# Patient Record
Sex: Female | Born: 1965
Health system: Southern US, Community
[De-identification: ages and names within clinical notes are randomized; demographics above are authoritative.]

## PROBLEM LIST (undated history)

## (undated) DIAGNOSIS — B029 Zoster without complications: Secondary | ICD-10-CM

## (undated) DIAGNOSIS — K219 Gastro-esophageal reflux disease without esophagitis: Secondary | ICD-10-CM

## (undated) DIAGNOSIS — I255 Ischemic cardiomyopathy: Secondary | ICD-10-CM

## (undated) DIAGNOSIS — Z720 Tobacco use: Secondary | ICD-10-CM

## (undated) DIAGNOSIS — E119 Type 2 diabetes mellitus without complications: Secondary | ICD-10-CM

## (undated) DIAGNOSIS — R32 Unspecified urinary incontinence: Secondary | ICD-10-CM

## (undated) DIAGNOSIS — C801 Malignant (primary) neoplasm, unspecified: Secondary | ICD-10-CM

## (undated) DIAGNOSIS — J449 Chronic obstructive pulmonary disease, unspecified: Secondary | ICD-10-CM

## (undated) DIAGNOSIS — I5042 Chronic combined systolic (congestive) and diastolic (congestive) heart failure: Secondary | ICD-10-CM

## (undated) DIAGNOSIS — K121 Other forms of stomatitis: Secondary | ICD-10-CM

## (undated) DIAGNOSIS — M5090 Cervical disc disorder, unspecified, unspecified cervical region: Secondary | ICD-10-CM

## (undated) DIAGNOSIS — I219 Acute myocardial infarction, unspecified: Secondary | ICD-10-CM

## (undated) DIAGNOSIS — M4800 Spinal stenosis, site unspecified: Secondary | ICD-10-CM

## (undated) DIAGNOSIS — Z8739 Personal history of other diseases of the musculoskeletal system and connective tissue: Secondary | ICD-10-CM

## (undated) DIAGNOSIS — D071 Carcinoma in situ of vulva: Secondary | ICD-10-CM

## (undated) DIAGNOSIS — K859 Acute pancreatitis without necrosis or infection, unspecified: Secondary | ICD-10-CM

## (undated) DIAGNOSIS — I2510 Atherosclerotic heart disease of native coronary artery without angina pectoris: Secondary | ICD-10-CM

## (undated) DIAGNOSIS — I959 Hypotension, unspecified: Secondary | ICD-10-CM

## (undated) HISTORY — DX: Cervical disc disorder, unspecified, unspecified cervical region: M50.90

## (undated) HISTORY — PX: LYMPHADENECTOMY: SHX15

## (undated) HISTORY — DX: Unspecified urinary incontinence: R32

## (undated) HISTORY — DX: Other forms of stomatitis: K12.1

## (undated) HISTORY — DX: Carcinoma in situ of vulva: D07.1

## (undated) HISTORY — PX: ABDOMINAL HYSTERECTOMY: SHX81

## (undated) HISTORY — PX: ECTOPIC PREGNANCY SURGERY: SHX613

## (undated) HISTORY — DX: Spinal stenosis, site unspecified: M48.00

## (undated) HISTORY — DX: Personal history of other diseases of the musculoskeletal system and connective tissue: Z87.39

## (undated) HISTORY — PX: OTHER SURGICAL HISTORY: SHX169

## (undated) HISTORY — PX: SPLENECTOMY, PARTIAL: SHX787

---

## 2004-03-31 ENCOUNTER — Emergency Department: Payer: Self-pay | Admitting: Emergency Medicine

## 2004-06-04 ENCOUNTER — Emergency Department: Payer: Self-pay | Admitting: Internal Medicine

## 2004-08-30 ENCOUNTER — Emergency Department: Payer: Self-pay | Admitting: General Practice

## 2004-11-12 ENCOUNTER — Emergency Department: Payer: Self-pay | Admitting: Emergency Medicine

## 2008-02-27 ENCOUNTER — Inpatient Hospital Stay: Payer: Self-pay | Admitting: Internal Medicine

## 2008-02-27 ENCOUNTER — Other Ambulatory Visit: Payer: Self-pay

## 2008-12-24 ENCOUNTER — Ambulatory Visit: Payer: Self-pay | Admitting: Cardiovascular Disease

## 2008-12-24 ENCOUNTER — Inpatient Hospital Stay: Payer: Self-pay | Admitting: Internal Medicine

## 2009-01-30 ENCOUNTER — Ambulatory Visit: Payer: Self-pay | Admitting: Unknown Physician Specialty

## 2014-04-19 ENCOUNTER — Emergency Department: Payer: Self-pay | Admitting: Emergency Medicine

## 2014-04-19 LAB — CBC
HCT: 48.7 % — ABNORMAL HIGH (ref 35.0–47.0)
HGB: 16.1 g/dL — AB (ref 12.0–16.0)
MCH: 31.4 pg (ref 26.0–34.0)
MCHC: 33 g/dL (ref 32.0–36.0)
MCV: 95 fL (ref 80–100)
PLATELETS: 318 10*3/uL (ref 150–440)
RBC: 5.12 10*6/uL (ref 3.80–5.20)
RDW: 13.5 % (ref 11.5–14.5)
WBC: 15.2 10*3/uL — ABNORMAL HIGH (ref 3.6–11.0)

## 2014-04-19 LAB — URINALYSIS, COMPLETE
BILIRUBIN, UR: NEGATIVE
Glucose,UR: >=500 mg/dL (ref 0–75)
Nitrite: NEGATIVE
PH: 6 (ref 4.5–8.0)
Protein: NEGATIVE
Specific Gravity: 1.006 (ref 1.003–1.030)
WBC UR: 93 /HPF (ref 0–5)

## 2014-04-19 LAB — COMPREHENSIVE METABOLIC PANEL
ALT: 19 U/L
Albumin: 3.8 g/dL (ref 3.4–5.0)
Alkaline Phosphatase: 91 U/L
Anion Gap: 10 (ref 7–16)
BUN: 9 mg/dL (ref 7–18)
Bilirubin,Total: 0.5 mg/dL (ref 0.2–1.0)
CO2: 19 mmol/L — AB (ref 21–32)
Calcium, Total: 9.3 mg/dL (ref 8.5–10.1)
Chloride: 100 mmol/L (ref 98–107)
Creatinine: 0.71 mg/dL (ref 0.60–1.30)
EGFR (African American): >60
EGFR (Non-African Amer.): >60
Glucose: 321 mg/dL — ABNORMAL HIGH (ref 65–99)
Osmolality: 270 (ref 275–301)
Potassium: 4.2 mmol/L (ref 3.5–5.1)
SGOT(AST): 22 U/L (ref 15–37)
SODIUM: 129 mmol/L — AB (ref 136–145)
Total Protein: 8.6 g/dL — ABNORMAL HIGH (ref 6.4–8.2)

## 2014-04-19 LAB — TROPONIN I

## 2014-12-27 ENCOUNTER — Inpatient Hospital Stay
Admission: EM | Admit: 2014-12-27 | Discharge: 2014-12-28 | DRG: 690 | Disposition: A | Discharge status: Home / Self Care | Payer: Self-pay | Attending: Internal Medicine | Admitting: Internal Medicine

## 2014-12-27 ENCOUNTER — Encounter: Payer: Self-pay | Admitting: Emergency Medicine

## 2014-12-27 DIAGNOSIS — Z7982 Long term (current) use of aspirin: Secondary | ICD-10-CM

## 2014-12-27 DIAGNOSIS — R109 Unspecified abdominal pain: Secondary | ICD-10-CM

## 2014-12-27 DIAGNOSIS — B029 Zoster without complications: Secondary | ICD-10-CM | POA: Diagnosis present

## 2014-12-27 DIAGNOSIS — Z794 Long term (current) use of insulin: Secondary | ICD-10-CM

## 2014-12-27 DIAGNOSIS — Z859 Personal history of malignant neoplasm, unspecified: Secondary | ICD-10-CM

## 2014-12-27 DIAGNOSIS — K219 Gastro-esophageal reflux disease without esophagitis: Secondary | ICD-10-CM | POA: Diagnosis present

## 2014-12-27 DIAGNOSIS — E119 Type 2 diabetes mellitus without complications: Secondary | ICD-10-CM

## 2014-12-27 DIAGNOSIS — E785 Hyperlipidemia, unspecified: Secondary | ICD-10-CM | POA: Diagnosis present

## 2014-12-27 DIAGNOSIS — N12 Tubulo-interstitial nephritis, not specified as acute or chronic: Secondary | ICD-10-CM

## 2014-12-27 DIAGNOSIS — Z886 Allergy status to analgesic agent status: Secondary | ICD-10-CM

## 2014-12-27 DIAGNOSIS — J449 Chronic obstructive pulmonary disease, unspecified: Secondary | ICD-10-CM | POA: Diagnosis present

## 2014-12-27 DIAGNOSIS — J432 Centrilobular emphysema: Secondary | ICD-10-CM | POA: Diagnosis present

## 2014-12-27 DIAGNOSIS — Z888 Allergy status to other drugs, medicaments and biological substances status: Secondary | ICD-10-CM

## 2014-12-27 DIAGNOSIS — Z8619 Personal history of other infectious and parasitic diseases: Secondary | ICD-10-CM | POA: Diagnosis present

## 2014-12-27 DIAGNOSIS — F17210 Nicotine dependence, cigarettes, uncomplicated: Secondary | ICD-10-CM | POA: Diagnosis present

## 2014-12-27 DIAGNOSIS — N3000 Acute cystitis without hematuria: Principal | ICD-10-CM | POA: Diagnosis present

## 2014-12-27 DIAGNOSIS — R300 Dysuria: Secondary | ICD-10-CM | POA: Diagnosis present

## 2014-12-27 DIAGNOSIS — R1110 Vomiting, unspecified: Secondary | ICD-10-CM

## 2014-12-27 DIAGNOSIS — E1165 Type 2 diabetes mellitus with hyperglycemia: Secondary | ICD-10-CM | POA: Diagnosis present

## 2014-12-27 DIAGNOSIS — E1140 Type 2 diabetes mellitus with diabetic neuropathy, unspecified: Secondary | ICD-10-CM

## 2014-12-27 HISTORY — DX: Malignant (primary) neoplasm, unspecified: C80.1

## 2014-12-27 HISTORY — DX: Type 2 diabetes mellitus without complications: E11.9

## 2014-12-27 HISTORY — DX: Acute pancreatitis without necrosis or infection, unspecified: K85.90

## 2014-12-27 HISTORY — DX: Gastro-esophageal reflux disease without esophagitis: K21.9

## 2014-12-27 HISTORY — DX: Chronic obstructive pulmonary disease, unspecified: J44.9

## 2014-12-27 HISTORY — DX: Zoster without complications: B02.9

## 2014-12-27 LAB — CBC WITH DIFFERENTIAL/PLATELET
Basophils Absolute: 0 10*3/uL (ref 0–0.1)
Basophils Relative: 0 %
EOS ABS: 0.2 10*3/uL (ref 0–0.7)
Eosinophils Relative: 2 %
HCT: 50 % — ABNORMAL HIGH (ref 35.0–47.0)
HEMOGLOBIN: 16.6 g/dL — AB (ref 12.0–16.0)
Lymphocytes Relative: 31 %
Lymphs Abs: 4.2 10*3/uL — ABNORMAL HIGH (ref 1.0–3.6)
MCH: 31.5 pg (ref 26.0–34.0)
MCHC: 33.3 g/dL (ref 32.0–36.0)
MCV: 94.6 fL (ref 80.0–100.0)
MONOS PCT: 4 %
Monocytes Absolute: 0.5 10*3/uL (ref 0.2–0.9)
NEUTROS PCT: 63 %
Neutro Abs: 8.6 10*3/uL — ABNORMAL HIGH (ref 1.4–6.5)
Platelets: 297 10*3/uL (ref 150–440)
RBC: 5.28 MIL/uL — ABNORMAL HIGH (ref 3.80–5.20)
RDW: 14.3 % (ref 11.5–14.5)
WBC: 13.5 10*3/uL — ABNORMAL HIGH (ref 3.6–11.0)

## 2014-12-27 LAB — URINALYSIS COMPLETE WITH MICROSCOPIC (ARMC ONLY)
Bilirubin Urine: NEGATIVE
Glucose, UA: >500 mg/dL — AB
Ketones, ur: NEGATIVE mg/dL
NITRITE: NEGATIVE
PROTEIN: NEGATIVE mg/dL
Specific Gravity, Urine: 1.005 (ref 1.005–1.030)
pH: 5 (ref 5.0–8.0)

## 2014-12-27 LAB — GLUCOSE, CAPILLARY
Glucose-Capillary: 323 mg/dL — ABNORMAL HIGH (ref 65–99)
Glucose-Capillary: 330 mg/dL — ABNORMAL HIGH (ref 65–99)

## 2014-12-27 LAB — COMPREHENSIVE METABOLIC PANEL
ALT: 20 U/L (ref 14–54)
AST: 17 U/L (ref 15–41)
Albumin: 4.1 g/dL (ref 3.5–5.0)
Alkaline Phosphatase: 85 U/L (ref 38–126)
Anion gap: 13 (ref 5–15)
BUN: 11 mg/dL (ref 6–20)
CALCIUM: 9.1 mg/dL (ref 8.9–10.3)
CO2: 20 mmol/L — AB (ref 22–32)
CREATININE: 0.56 mg/dL (ref 0.44–1.00)
Chloride: 99 mmol/L — ABNORMAL LOW (ref 101–111)
GFR calc Af Amer: >60 mL/min (ref >60)
Glucose, Bld: 319 mg/dL — ABNORMAL HIGH (ref 65–99)
Potassium: 4 mmol/L (ref 3.5–5.1)
Sodium: 132 mmol/L — ABNORMAL LOW (ref 135–145)
Total Bilirubin: 0.8 mg/dL (ref 0.3–1.2)
Total Protein: 8.1 g/dL (ref 6.5–8.1)

## 2014-12-27 LAB — TROPONIN I: Troponin I: <0.03 ng/mL (ref <0.031)

## 2014-12-27 LAB — LIPASE, BLOOD: Lipase: 20 U/L — ABNORMAL LOW (ref 22–51)

## 2014-12-27 MED ORDER — HYDROMORPHONE HCL 1 MG/ML IJ SOLN
INTRAMUSCULAR | Status: AC
  Administered 2014-12-27: 1 mg via INTRAVENOUS
  Filled 2014-12-27: qty 1

## 2014-12-27 MED ORDER — CIPROFLOXACIN IN D5W 400 MG/200ML IV SOLN
400.0000 mg | Freq: Once | INTRAVENOUS | Status: AC
  Administered 2014-12-27: 400 mg via INTRAVENOUS

## 2014-12-27 MED ORDER — IOHEXOL 240 MG/ML SOLN
25.0000 mL | Freq: Once | INTRAMUSCULAR | Status: AC | PRN
  Administered 2014-12-27: 25 mL via ORAL

## 2014-12-27 MED ORDER — ONDANSETRON 8 MG PO TBDP
ORAL_TABLET | ORAL | Status: AC
  Filled 2014-12-27: qty 1

## 2014-12-27 MED ORDER — CIPROFLOXACIN IN D5W 400 MG/200ML IV SOLN
INTRAVENOUS | Status: AC
Start: 2014-12-27 — End: 2014-12-27
  Administered 2014-12-27: 400 mg via INTRAVENOUS
  Filled 2014-12-27: qty 200

## 2014-12-27 MED ORDER — ONDANSETRON 8 MG PO TBDP
8.0000 mg | ORAL_TABLET | Freq: Once | ORAL | Status: AC
  Administered 2014-12-27: 8 mg via ORAL

## 2014-12-27 MED ORDER — HYDROMORPHONE HCL 1 MG/ML IJ SOLN
1.0000 mg | INTRAMUSCULAR | Status: DC | PRN
  Administered 2014-12-27 – 2014-12-28 (×2): 1 mg via INTRAVENOUS

## 2014-12-27 NOTE — ED Notes (Signed)
Pt reports hx of pancreatitis and DKA; pt reports pain upper abdominal pain that radiates around left side and into back. Pt reports nausea, vomiting diarrhea since 2am.

## 2014-12-27 NOTE — ED Notes (Signed)
POC CBG= 330 mg/dL

## 2014-12-27 NOTE — ED Notes (Signed)
Pt believes her abd pain is r/t to her hx of Pancreatitis.

## 2014-12-27 NOTE — ED Provider Notes (Signed)
[facility] Emergency Department Provider Note  ____________________________________________  Time seen: Approximately 9:55 PM  I have reviewed the triage vital signs and the nursing notes.   HISTORY  Chief Complaint Abdominal Pain and Hyperglycemia    HPI Renee Mack is a 49 y.o. female she complains of vomiting and epigastric pain radiating from the epigastrium through to the back. Patient says it feels like her previous episodes of pancreatitis. Patient says she feels lightheaded. Patient also has had DKA in the past. Symptoms have been going on all day today and the little bit yesterday.   Past Medical History  Diagnosis Date  . Diabetes mellitus without complication   . Pancreatitis   . COPD (chronic obstructive pulmonary disease)   . GERD (gastroesophageal reflux disease)   . Cancer   . Shingles     There are no active problems to display for this patient.   Past Surgical History  Procedure Laterality Date  . Lymphadenectomy    . Vulvulasectomy    . Splenectomy, partial    . Abdominal hysterectomy      partial  . Ectopic pregnancy surgery Left     No current outpatient prescriptions on file.  Allergies Metformin and related; Darvon; Gabapentin; and Nsaids  No family history on file.  Social History History  Substance Use Topics  . Smoking status: Current Every Day Smoker    Types: Cigarettes  . Smokeless tobacco: Not on file  . Alcohol Use: No    Review of Systems Constitutional: No fever/chills Eyes: No visual changes. ENT: No sore throat. Cardiovascular: Denies chest pain. Respiratory: Denies shortness of breath. Gastrointestinal: No abdominal pain  No constipation. Genitourinary: Negative for dysuria. Musculoskeletal: Negative for back pain. Skin: Negative for rash. Neurological: Negative for headaches, focal weakness or numbness.  10-point ROS otherwise  negative.  ____________________________________________   PHYSICAL EXAM:  VITAL SIGNS: ED Triage Vitals  Enc Vitals Group     BP 12/27/14 1724 158/88 mmHg     Pulse Rate 12/27/14 1724 116     Resp 12/27/14 1724 19     Temp 12/27/14 1724 98.4 F (36.9 C)     Temp Source 12/27/14 1724 Oral     SpO2 12/27/14 1724 97 %     Weight 12/27/14 1724 150 lb (68.04 kg)     Height 12/27/14 1724 5\' 1"  (1.549 m)     Head Cir --      Peak Flow --      Pain Score 12/27/14 1725 10     Pain Loc --      Pain Edu? --      Excl. in [place]? --     Constitutional: Alert and oriented. Well appearing and in no acute distress. Eyes: Conjunctivae are normal. PERRL. EOMI. Head: Atraumatic. Nose: No congestion/rhinnorhea. Mouth/Throat: Mucous membranes are moist.  Oropharynx non-erythematous. Neck: No stridor.  Cardiovascular: Normal rate, regular rhythm. Grossly normal heart sounds.  Good peripheral circulation. Respiratory: Normal respiratory effort.  No retractions. Lungs CTAB. Gastrointestinal: Soft and tender in the epigastrium. No distention. No abdominal bruits. No CVA tenderness. Musculoskeletal: No lower extremity tenderness nor edema.  No joint effusions. Patient does have bilateral CVA tenderness is quite bad Neurologic:  Normal speech and language. No gross focal neurologic deficits are appreciated. Speech is normal. No gait instability. Skin:  Skin is warm, dry and intact. No rash noted. Psychiatric: Mood and affect are normal. Speech and behavior are normal.  ____________________________________________   LABS (all labs ordered  are listed, but only abnormal results are displayed)  Labs Reviewed  CBC WITH DIFFERENTIAL/PLATELET - Abnormal; Notable for the following:    WBC 13.5 (*)    RBC 5.28 (*)    Hemoglobin 16.6 (*)    HCT 50.0 (*)    Neutro Abs 8.6 (*)    Lymphs Abs 4.2 (*)    All other components within normal limits  COMPREHENSIVE METABOLIC PANEL - Abnormal; Notable for the  following:    Sodium 132 (*)    Chloride 99 (*)    CO2 20 (*)    Glucose, Bld 319 (*)    All other components within normal limits  LIPASE, BLOOD - Abnormal; Notable for the following:    Lipase 20 (*)    All other components within normal limits  URINALYSIS COMPLETEWITH MICROSCOPIC ([facility] ONLY) - Abnormal; Notable for the following:    Color, Urine STRAW (*)    APPearance CLEAR (*)    Glucose, UA >500 (*)    Hgb urine dipstick 2+ (*)    Leukocytes, UA 3+ (*)    Bacteria, UA RARE (*)    Squamous Epithelial / LPF 0-5 (*)    All other components within normal limits  GLUCOSE, CAPILLARY - Abnormal; Notable for the following:    Glucose-Capillary 323 (*)    All other components within normal limits  GLUCOSE, CAPILLARY - Abnormal; Notable for the following:    Glucose-Capillary 330 (*)    All other components within normal limits  URINE CULTURE  CULTURE, BLOOD (ROUTINE X 2)  CULTURE, BLOOD (ROUTINE X 2)  TROPONIN I  CBG MONITORING, ED   ____________________________________________  EKG   ____________________________________________  RADIOLOGY  Pending ____________________________________________   PROCEDURES   ____________________________________________   INITIAL IMPRESSION / ASSESSMENT AND PLAN / ED COURSE  Pertinent labs & imaging results that were available during my care of the patient were reviewed by me and considered in my medical decision making (see chart for details).  Patient has several episodes several readings of low blood pressure in the 90 systolic ____________________________________________   FINAL CLINICAL IMPRESSION(S) / ED DIAGNOSES  Final diagnoses:  None      Nena Polio, MD 12/27/14 2342

## 2014-12-28 ENCOUNTER — Encounter: Payer: Self-pay | Admitting: Internal Medicine

## 2014-12-28 ENCOUNTER — Emergency Department: Payer: Self-pay

## 2014-12-28 DIAGNOSIS — K219 Gastro-esophageal reflux disease without esophagitis: Secondary | ICD-10-CM | POA: Diagnosis present

## 2014-12-28 DIAGNOSIS — E1140 Type 2 diabetes mellitus with diabetic neuropathy, unspecified: Secondary | ICD-10-CM

## 2014-12-28 DIAGNOSIS — Z8619 Personal history of other infectious and parasitic diseases: Secondary | ICD-10-CM | POA: Diagnosis present

## 2014-12-28 DIAGNOSIS — E1165 Type 2 diabetes mellitus with hyperglycemia: Secondary | ICD-10-CM

## 2014-12-28 DIAGNOSIS — E785 Hyperlipidemia, unspecified: Secondary | ICD-10-CM | POA: Diagnosis present

## 2014-12-28 DIAGNOSIS — J432 Centrilobular emphysema: Secondary | ICD-10-CM | POA: Diagnosis present

## 2014-12-28 DIAGNOSIS — N12 Tubulo-interstitial nephritis, not specified as acute or chronic: Secondary | ICD-10-CM | POA: Diagnosis present

## 2014-12-28 DIAGNOSIS — E119 Type 2 diabetes mellitus without complications: Secondary | ICD-10-CM

## 2014-12-28 LAB — BASIC METABOLIC PANEL
ANION GAP: 8 (ref 5–15)
BUN: 11 mg/dL (ref 6–20)
CHLORIDE: 101 mmol/L (ref 101–111)
CO2: 25 mmol/L (ref 22–32)
Calcium: 9.2 mg/dL (ref 8.9–10.3)
Creatinine, Ser: 0.5 mg/dL (ref 0.44–1.00)
Glucose, Bld: 332 mg/dL — ABNORMAL HIGH (ref 65–99)
POTASSIUM: 4.1 mmol/L (ref 3.5–5.1)
SODIUM: 134 mmol/L — AB (ref 135–145)

## 2014-12-28 LAB — CBC
HCT: 44.9 % (ref 35.0–47.0)
HEMOGLOBIN: 14.9 g/dL (ref 12.0–16.0)
MCH: 32 pg (ref 26.0–34.0)
MCHC: 33.2 g/dL (ref 32.0–36.0)
MCV: 96.3 fL (ref 80.0–100.0)
PLATELETS: 223 10*3/uL (ref 150–440)
RBC: 4.67 MIL/uL (ref 3.80–5.20)
RDW: 14.3 % (ref 11.5–14.5)
WBC: 10.4 10*3/uL (ref 3.6–11.0)

## 2014-12-28 LAB — GLUCOSE, CAPILLARY
GLUCOSE-CAPILLARY: 345 mg/dL — AB (ref 65–99)
Glucose-Capillary: 311 mg/dL — ABNORMAL HIGH (ref 65–99)

## 2014-12-28 MED ORDER — ONDANSETRON HCL 4 MG PO TABS: 4.0000 mg | ORAL_TABLET | Freq: Four times a day (QID) | ORAL | Status: DC | PRN

## 2014-12-28 MED ORDER — INSULIN ASPART 100 UNIT/ML ~~LOC~~ SOLN
20.0000 [IU] | Freq: Three times a day (TID) | SUBCUTANEOUS | Status: DC
  Administered 2014-12-28: 20 [IU] via SUBCUTANEOUS
  Filled 2014-12-28: qty 20

## 2014-12-28 MED ORDER — PROMETHAZINE HCL 25 MG/ML IJ SOLN
12.5000 mg | INTRAMUSCULAR | Status: DC | PRN
  Administered 2014-12-28: 12.5 mg via INTRAVENOUS
  Filled 2014-12-28: qty 1

## 2014-12-28 MED ORDER — CIPROFLOXACIN HCL 500 MG PO TABS: 500.0000 mg | ORAL_TABLET | Freq: Two times a day (BID) | ORAL | Status: DC

## 2014-12-28 MED ORDER — HYDROMORPHONE HCL 1 MG/ML IJ SOLN
0.5000 mg | INTRAMUSCULAR | Status: DC | PRN
  Administered 2014-12-28 (×2): 0.5 mg via INTRAVENOUS
  Filled 2014-12-28 (×2): qty 1

## 2014-12-28 MED ORDER — ASPIRIN 81 MG PO CHEW
81.0000 mg | CHEWABLE_TABLET | Freq: Every day | ORAL | Status: DC
  Administered 2014-12-28: 81 mg via ORAL
  Filled 2014-12-28: qty 1

## 2014-12-28 MED ORDER — IOHEXOL 300 MG/ML  SOLN
100.0000 mL | Freq: Once | INTRAMUSCULAR | Status: AC | PRN
  Administered 2014-12-28: 100 mL via INTRAVENOUS

## 2014-12-28 MED ORDER — ACETAMINOPHEN 650 MG RE SUPP: 650.0000 mg | Freq: Four times a day (QID) | RECTAL | Status: DC | PRN

## 2014-12-28 MED ORDER — PROMETHAZINE HCL 25 MG/ML IJ SOLN
12.5000 mg | Freq: Once | INTRAMUSCULAR | Status: AC
  Administered 2014-12-28: 12.5 mg via INTRAVENOUS

## 2014-12-28 MED ORDER — INSULIN ASPART 100 UNIT/ML ~~LOC~~ SOLN
0.0000 [IU] | Freq: Three times a day (TID) | SUBCUTANEOUS | Status: DC
  Administered 2014-12-28: 7 [IU] via SUBCUTANEOUS
  Filled 2014-12-28: qty 7

## 2014-12-28 MED ORDER — PANTOPRAZOLE SODIUM 40 MG PO TBEC
40.0000 mg | DELAYED_RELEASE_TABLET | Freq: Every day | ORAL | Status: DC
  Administered 2014-12-28: 40 mg via ORAL
  Filled 2014-12-28: qty 1

## 2014-12-28 MED ORDER — VALACYCLOVIR HCL 500 MG PO TABS
500.0000 mg | ORAL_TABLET | Freq: Every day | ORAL | Status: DC
  Administered 2014-12-28: 500 mg via ORAL
  Filled 2014-12-28: qty 1

## 2014-12-28 MED ORDER — ENOXAPARIN SODIUM 40 MG/0.4ML ~~LOC~~ SOLN
40.0000 mg | SUBCUTANEOUS | Status: DC
  Administered 2014-12-28: 40 mg via SUBCUTANEOUS
  Filled 2014-12-28: qty 0.4

## 2014-12-28 MED ORDER — GLIPIZIDE 10 MG PO TABS: 10.0000 mg | ORAL_TABLET | Freq: Two times a day (BID) | ORAL | Status: DC

## 2014-12-28 MED ORDER — INSULIN GLARGINE 100 UNIT/ML ~~LOC~~ SOLN
50.0000 [IU] | Freq: Two times a day (BID) | SUBCUTANEOUS | Status: DC
  Administered 2014-12-28: 50 [IU] via SUBCUTANEOUS
  Filled 2014-12-28 (×3): qty 0.5

## 2014-12-28 MED ORDER — CIPROFLOXACIN IN D5W 400 MG/200ML IV SOLN
400.0000 mg | Freq: Two times a day (BID) | INTRAVENOUS | Status: DC
  Administered 2014-12-28: 400 mg via INTRAVENOUS
  Filled 2014-12-28 (×3): qty 200

## 2014-12-28 MED ORDER — ACETAMINOPHEN 325 MG PO TABS: 650.0000 mg | ORAL_TABLET | Freq: Four times a day (QID) | ORAL | Status: DC | PRN

## 2014-12-28 MED ORDER — ONDANSETRON HCL 4 MG/2ML IJ SOLN: 4.0000 mg | Freq: Four times a day (QID) | INTRAMUSCULAR | Status: DC | PRN

## 2014-12-28 MED ORDER — OXYCODONE-ACETAMINOPHEN 5-325 MG PO TABS
1.0000 | ORAL_TABLET | ORAL | Status: DC | PRN
Start: 2014-12-28 — End: 2014-12-28
  Administered 2014-12-28: 1 via ORAL
  Filled 2014-12-28: qty 1

## 2014-12-28 MED ORDER — OXYCODONE-ACETAMINOPHEN 5-325 MG PO TABS: 1.0000 | ORAL_TABLET | Freq: Four times a day (QID) | ORAL | Status: DC | PRN

## 2014-12-28 NOTE — Discharge Instructions (Signed)
Notify your doctor if your symptoms return and/or worsen.  Take all medications as prescribed including your antibiotic. Take your antibiotic until complete even if you are feeling better.  Drink plenty of fluids.  Notify your doctor for increasing pain not relieved with the prescribed pain medications.  Notify your doctor with any questions or concerns.

## 2014-12-28 NOTE — H&P (Addendum)
[place] [facility] [place] NAME: Renee Mack    MR#:  [id number]  DATE OF BIRTH:  [birth year]  DATE OF ADMISSION:  12/28/2014  PRIMARY CARE PHYSICIAN: Sherlyn Lees, FNP   REQUESTING/REFERRING PHYSICIAN: Malinda  CHIEF COMPLAINT:   Chief Complaint  Patient presents with  . Abdominal Pain  . Hyperglycemia    HISTORY OF PRESENT ILLNESS:  Renee Mack  is a 49 y.o. female who presents with nausea vomiting and abdominal pain. She states that this pain started in her epigastric region and radiating around to her back, although it "moves around" and sometimes radiates down to her lower abdomen and groin. She states that she also has had some dysuria. Patient states that she's had pancreatitis in the past, and that this pain was somewhat similar to that. On evaluation in the ED her lipase was found to be low at 20, although her UA showed too numerous to count white cells with 3+ leuk esterase. Patient states that she did have some chills at home, and feels like she had a fever as well. She also notes that her blood sugars have been running higher. Hospitalists were called for admission for pyelonephritis.  PAST MEDICAL HISTORY:   Past Medical History  Diagnosis Date  . Diabetes mellitus without complication   . Pancreatitis   . COPD (chronic obstructive pulmonary disease)   . GERD (gastroesophageal reflux disease)   . Cancer   . Shingles   . HLD (hyperlipidemia)     PAST SURGICAL HISTORY:   Past Surgical History  Procedure Laterality Date  . Lymphadenectomy    . Vulvulasectomy    . Splenectomy, partial    . Abdominal hysterectomy      partial  . Ectopic pregnancy surgery Left     SOCIAL HISTORY:   History  Substance Use Topics  . Smoking status: Current Every Day Smoker    Types: Cigarettes  . Smokeless tobacco: Not on file  . Alcohol Use: No    FAMILY HISTORY:   Family History  Problem Relation Age of Onset  . Cancer  Mother   . Osteoporosis Sister   . Heart disease Brother     DRUG ALLERGIES:   Allergies  Allergen Reactions  . Metformin And Related Shortness Of Breath  . Darvon [Propoxyphene] Itching  . Gabapentin Swelling  . Nsaids Other (See Comments)    Ulcers     MEDICATIONS AT HOME:   Prior to Admission medications   Not on File    REVIEW OF SYSTEMS:  Review of Systems  Constitutional: Positive for fever (subjective) and chills. Negative for weight loss and malaise/fatigue.  HENT: Negative for ear pain, hearing loss and tinnitus.   Eyes: Negative for blurred vision, double vision, pain and redness.  Respiratory: Negative for cough, hemoptysis and shortness of breath.   Cardiovascular: Negative for chest pain, palpitations, orthopnea and leg swelling.  Gastrointestinal: Positive for nausea, vomiting and abdominal pain. Negative for diarrhea and constipation.  Genitourinary: Positive for dysuria. Negative for frequency and hematuria.  Musculoskeletal: Negative for back pain, joint pain and neck pain.  Skin:       No acne, rash, or lesions  Neurological: Negative for dizziness, tremors, focal weakness and weakness.  Endo/Heme/Allergies: Negative for polydipsia. Does not bruise/bleed easily.  Psychiatric/Behavioral: Negative for depression. The patient is not nervous/anxious and does not have insomnia.      VITAL SIGNS:   Filed Vitals:   12/27/14 1724 12/27/14 1919  12/27/14 2154 12/28/14 0047  BP: 158/88 141/104 115/86 116/72  Pulse: 116 95 89 81  Temp: 98.4 F (36.9 C)     TempSrc: Oral     Resp: 19 16 12 12   Height: 5\' 1"  (1.549 m)     Weight: 68.04 kg (150 lb)     SpO2: 97% 97% 92% 93%   Wt Readings from Last 3 Encounters:  12/27/14 68.04 kg (150 lb)    PHYSICAL EXAMINATION:  Physical Exam  Constitutional: She is oriented to person, place, and time. She appears well-developed and well-nourished. No distress.  HENT:  Head: Normocephalic and atraumatic.   Mouth/Throat: Oropharynx is clear and moist.  Eyes: Conjunctivae and EOM are normal. Pupils are equal, round, and reactive to light. No scleral icterus.  Neck: Normal range of motion. Neck supple. No JVD present. No thyromegaly present.  Cardiovascular: Normal rate, regular rhythm and intact distal pulses.  Exam reveals no gallop and no friction rub.   No murmur heard. Respiratory: Effort normal and breath sounds normal. No respiratory distress. She has no wheezes. She has no rales.  GI: Soft. Bowel sounds are normal. She exhibits no distension. There is tenderness.  Musculoskeletal: Normal range of motion. She exhibits tenderness (CVA). She exhibits no edema.  No arthritis, no gout  Lymphadenopathy:    She has no cervical adenopathy.  Neurological: She is alert and oriented to person, place, and time. No cranial nerve deficit.  No dysarthria, no aphasia  Skin: Skin is warm and dry. No rash noted. No erythema.  Psychiatric: She has a normal mood and affect. Her behavior is normal. Judgment and thought content normal.    LABORATORY PANEL:   CBC  Recent Labs Lab 12/27/14 1733  WBC 13.5*  HGB 16.6*  HCT 50.0*  PLT 297   ------------------------------------------------------------------------------------------------------------------  Chemistries   Recent Labs Lab 12/27/14 1733  NA 132*  K 4.0  CL 99*  CO2 20*  GLUCOSE 319*  BUN 11  CREATININE 0.56  CALCIUM 9.1  AST 17  ALT 20  ALKPHOS 85  BILITOT 0.8   ------------------------------------------------------------------------------------------------------------------  Cardiac Enzymes  Recent Labs Lab 12/27/14 1733  TROPONINI <0.03   ------------------------------------------------------------------------------------------------------------------  RADIOLOGY:  Ct Abdomen Pelvis W Contrast  12/28/2014   CLINICAL DATA:  Acute onset of epigastric abdominal pain and vomiting. Pain radiates to the back. Lightheaded.  Initial encounter.  EXAM: CT ABDOMEN AND PELVIS WITH CONTRAST  TECHNIQUE: Multidetector CT imaging of the abdomen and pelvis was performed using the standard protocol following bolus administration of intravenous contrast.  CONTRAST:  118mL OMNIPAQUE IOHEXOL 300 MG/ML  SOLN  COMPARISON:  CT of the abdomen and pelvis performed 12/24/2008, MRI of the abdomen performed 12/26/2008, and abdominal ultrasound performed 12/25/2008  FINDINGS: Minimal bibasilar atelectasis is noted.  The liver and spleen are unremarkable in appearance. The gallbladder is within normal limits. The pancreas and adrenal glands are unremarkable.  The kidneys are unremarkable in appearance. There is no evidence of hydronephrosis. No renal or ureteral stones are seen. No perinephric stranding is appreciated.  No free fluid is identified. The small bowel is unremarkable in appearance. The stomach is within normal limits. No acute vascular abnormalities are seen. Scattered calcification is noted along the abdominal aorta and its branches.  A small anterior abdominal wall hernia is noted superior to the umbilicus, containing only fat.  The appendix is normal in caliber and contains trace air, without evidence of appendicitis. The colon is unremarkable in appearance.  The bladder is  relatively decompressed and grossly unremarkable in appearance. The patient is status post hysterectomy. No suspicious adnexal masses are seen. The ovaries are within normal limits. No inguinal lymphadenopathy is seen.  Minimal nonspecific soft tissue inflammation is noted along the lower anterior abdominal wall.  No acute osseous abnormalities are identified.  IMPRESSION: 1. No acute abnormality seen within the abdomen or pelvis. 2. Minimal nonspecific soft tissue inflammation along the lower anterior abdominal wall. 3. Scattered calcification along the abdominal aorta and its branches. 4. Small anterior abdominal wall hernia superior to the umbilicus, containing only fat.    Electronically Signed   By: Garald Balding M.D.   On: 12/28/2014 00:25    EKG:   Orders placed or performed during the hospital encounter of 12/27/14  . ED EKG   (only if pt is 51 y.o. or older and pain is above umbilicus)  . ED EKG   (only if pt is 51 y.o. or older and pain is above umbilicus)    IMPRESSION AND PLAN:  Principal Problem:   Pyelonephritis - patient started on IV ciprofloxacin in the ED, urine culture sent. Follow for sensitivities. Pain control and antiemetics on board. Active Problems:   Type 2 diabetes mellitus - carb controlled diet, sliding scale insulin with appropriate glucose checks here.   COPD (chronic obstructive pulmonary disease) - stable, medication reconciliation completed by pharmacy, patient is unsure of all her medications, she will need to continue her home inhalers for this once reconciliation is complete.   GERD (gastroesophageal reflux disease) - PPI while here   History of shingles - patient takes valacyclovir for prophylaxis.   HLD (hyperlipidemia) - we'll need to continue home dose medication once reconciliation completed  All the records are reviewed and case discussed with ED provider. Management plans discussed with the patient and/or family.  DVT PROPHYLAXIS: SubQ lovenox  ADMISSION STATUS: Inpatient  CODE STATUS: Full  TOTAL TIME TAKING CARE OF THIS PATIENT: 40 minutes.    Kelby Lotspeich [place] 12/28/2014, 1:07 AM  [facility]  Office  [phone]  CC: Primary care physician; Sherlyn Lees, FNP

## 2014-12-28 NOTE — Discharge Summary (Signed)
[facility] NAME: Renee Mack    MR#:  [id number]  DATE OF BIRTH:  1965-09-26  DATE OF ADMISSION:  12/27/2014 ADMITTING PHYSICIAN: Lance Coon, MD  DATE OF DISCHARGE:12/28/14  PRIMARY CARE PHYSICIAN: Sherlyn Lees, FNP    ADMISSION DIAGNOSIS:  Pyelonephritis [N12] Abdominal pain, unspecified abdominal location [R10.9] Intractable vomiting with nausea, vomiting of unspecified type [R11.10]  DISCHARGE DIAGNOSIS:  Acute cystitis Nausea/vomiting -resolved  SECONDARY DIAGNOSIS:   Past Medical History  Diagnosis Date  . Diabetes mellitus without complication   . Pancreatitis   . COPD (chronic obstructive pulmonary disease)   . GERD (gastroesophageal reflux disease)   . Cancer   . Shingles   . HLD (hyperlipidemia)     HOSPITAL COURSE:   49 y.o. female who presents with nausea vomiting and abdominal pain. She states that this pain started in her epigastric region and radiating around to her back, although it "moves around" and sometimes radiates down to her lower abdomen and groin. She states that she also has had some dysuria *Acute cystitis -on IV ciprofloxacin  urine culture sent. BC neg, pt afebrile,ate breakfast,CT-scan of the abdomen neg for pyelone[hritis - Pain control and antiemetics on board.  * Type 2 diabetes mellitus - carb controlled diet, sliding scale insulin with appropriate glucose checks here. Resume home dose lantus and novolog   COPD (chronic obstructive pulmonary disease) - stable   GERD (gastroesophageal reflux disease) - PPI while here   History of shingles - patient takes valacyclovir for prophylaxis.   HLD (hyperlipidemia)  Resume home meds  Overall improving. Will d/c later to home with po cipro for 7 days   DISCHARGE CONDITIONS:   fair CONSULTS OBTAINED:   none  DRUG ALLERGIES:   Allergies  Allergen Reactions  . Metformin And Related Shortness Of Breath  . Darvon  [Propoxyphene] Itching  . Gabapentin Swelling  . Nsaids Other (See Comments)    Ulcers     DISCHARGE MEDICATIONS:   Current Discharge Medication List    START taking these medications   Details  ciprofloxacin (CIPRO) 500 MG tablet Take 1 tablet (500 mg total) by mouth 2 (two) times daily. Qty: 14 tablet, Refills: 0    oxyCODONE-acetaminophen (PERCOCET/ROXICET) 5-325 MG per tablet Take 1 tablet by mouth every 6 (six) hours as needed for moderate pain. Qty: 25 tablet, Refills: 0      CONTINUE these medications which have NOT CHANGED   Details  acyclovir (ZOVIRAX) 400 MG tablet Take 500 mg by mouth 2 (two) times daily.    aspirin 81 MG tablet Take 81 mg by mouth daily.    esomeprazole (NEXIUM) 40 MG capsule Take 40 mg by mouth daily at 12 noon.    insulin aspart (NOVOLOG) 100 UNIT/ML injection Inject 20 Units into the skin 2 (two) times daily at 10 am and 4 pm.    insulin glargine (LANTUS) 100 UNIT/ML injection Inject 50 Units into the skin 2 (two) times daily.        If you experience worsening of your admission symptoms, develop shortness of breath, life threatening emergency, suicidal or homicidal thoughts you must seek medical attention immediately by calling 911 or calling your MD immediately  if symptoms less severe.  You Must read complete instructions/literature along with all the possible adverse reactions/side effects for all the Medicines you take and that have been prescribed to you. Take any new Medicines after you have completely understood and accept all  the possible adverse reactions/side effects.   Please note  You were cared for by a hospitalist during your hospital stay. If you have any questions about your discharge medications or the care you received while you were in the hospital after you are discharged, you can call the unit and asked to speak with the hospitalist on call if the hospitalist that took care of you is not available. Once you are discharged,  your primary care physician will handle any further medical issues. Please note that NO REFILLS for any discharge medications will be authorized once you are discharged, as it is imperative that you return to your primary care physician (or establish a relationship with a primary care physician if you do not have one) for your aftercare needs so that they can reassess your need for medications and monitor your lab values. Today   SUBJECTIVE  Feels better. Some pain  VITAL SIGNS:  Blood pressure 93/62, pulse 73, temperature 97.7 F (36.5 C), temperature source Oral, resp. rate 17, height 5\' 1"  (1.549 m), weight 68.176 kg (150 lb 4.8 oz), SpO2 93 %.  I/O:   Intake/Output Summary (Last 24 hours) at 12/28/14 1357 Last data filed at 12/28/14 1300  Gross per 24 hour  Intake    480 ml  Output   1350 ml  Net   -870 ml    PHYSICAL EXAMINATION:  GENERAL:  49 y.o.-year-old patient lying in the bed with no acute distress.  EYES: Pupils equal, round, reactive to light and accommodation. No scleral icterus. Extraocular muscles intact.  HEENT: Head atraumatic, normocephalic. Oropharynx and nasopharynx clear.  NECK:  Supple, no jugular venous distention. No thyroid enlargement, no tenderness.  LUNGS: Normal breath sounds bilaterally, no wheezing, rales,rhonchi or crepitation. No use of accessory muscles of respiration.  CARDIOVASCULAR: S1, S2 normal. No murmurs, rubs, or gallops.  ABDOMEN: Soft, non-tender, non-distended. Bowel sounds present. No organomegaly or mass.  EXTREMITIES: No pedal edema, cyanosis, or clubbing.  NEUROLOGIC: Cranial nerves II through XII are intact. Muscle strength 5/5 in all extremities. Sensation intact. Gait not checked.  PSYCHIATRIC: The patient is alert and oriented x 3.  SKIN: No obvious rash, lesion, or ulcer.   DATA REVIEW:   CBC   Recent Labs Lab 12/28/14 0640  WBC 10.4  HGB 14.9  HCT 44.9  PLT 223    Chemistries   Recent Labs Lab 12/27/14 1733  12/28/14 0640  NA 132* 134*  K 4.0 4.1  CL 99* 101  CO2 20* 25  GLUCOSE 319* 332*  BUN 11 11  CREATININE 0.56 0.50  CALCIUM 9.1 9.2  AST 17  --   ALT 20  --   ALKPHOS 85  --   BILITOT 0.8  --     Microbiology Results   Recent Results (from the past 240 hour(s))  Urine culture     Status: None (Preliminary result)   Collection Time: 12/27/14 10:09 PM  Result Value Ref Range Status   Specimen Description URINE, RANDOM  Final   Special Requests Normal  Final   Culture   Final    50,000 COLONIES/mL GRAM NEGATIVE RODS IDENTIFICATION TO FOLLOW SUSCEPTIBILITIES TO FOLLOW    Report Status PENDING  Incomplete  Culture, blood (routine x 2)     Status: None (Preliminary result)   Collection Time: 12/27/14 10:09 PM  Result Value Ref Range Status   Specimen Description BLOOD  Final   Special Requests Normal  Final   Culture NO GROWTH < 12  HOURS  Final   Report Status PENDING  Incomplete  Culture, blood (routine x 2)     Status: None (Preliminary result)   Collection Time: 12/27/14 10:09 PM  Result Value Ref Range Status   Specimen Description BLOOD  Final   Special Requests Normal  Final   Culture NO GROWTH < 12 HOURS  Final   Report Status PENDING  Incomplete    RADIOLOGY:  Ct Abdomen Pelvis W Contrast  12/28/2014   CLINICAL DATA:  Acute onset of epigastric abdominal pain and vomiting. Pain radiates to the back. Lightheaded. Initial encounter.  EXAM: CT ABDOMEN AND PELVIS WITH CONTRAST  TECHNIQUE: Multidetector CT imaging of the abdomen and pelvis was performed using the standard protocol following bolus administration of intravenous contrast.  CONTRAST:  194mL OMNIPAQUE IOHEXOL 300 MG/ML  SOLN  COMPARISON:  CT of the abdomen and pelvis performed 12/24/2008, MRI of the abdomen performed 12/26/2008, and abdominal ultrasound performed 12/25/2008  FINDINGS: Minimal bibasilar atelectasis is noted.  The liver and spleen are unremarkable in appearance. The gallbladder is within normal  limits. The pancreas and adrenal glands are unremarkable.  The kidneys are unremarkable in appearance. There is no evidence of hydronephrosis. No renal or ureteral stones are seen. No perinephric stranding is appreciated.  No free fluid is identified. The small bowel is unremarkable in appearance. The stomach is within normal limits. No acute vascular abnormalities are seen. Scattered calcification is noted along the abdominal aorta and its branches.  A small anterior abdominal wall hernia is noted superior to the umbilicus, containing only fat.  The appendix is normal in caliber and contains trace air, without evidence of appendicitis. The colon is unremarkable in appearance.  The bladder is relatively decompressed and grossly unremarkable in appearance. The patient is status post hysterectomy. No suspicious adnexal masses are seen. The ovaries are within normal limits. No inguinal lymphadenopathy is seen.  Minimal nonspecific soft tissue inflammation is noted along the lower anterior abdominal wall.  No acute osseous abnormalities are identified.  IMPRESSION: 1. No acute abnormality seen within the abdomen or pelvis. 2. Minimal nonspecific soft tissue inflammation along the lower anterior abdominal wall. 3. Scattered calcification along the abdominal aorta and its branches. 4. Small anterior abdominal wall hernia superior to the umbilicus, containing only fat.   Electronically Signed   By: Garald Balding M.D.   On: 12/28/2014 00:25     Management plans discussed with the patient, family and they are in agreement.  CODE STATUS:     Code Status Orders        Start     Ordered   12/28/14 0306  Full code   Continuous     12/28/14 0305      TOTAL TIME TAKING CARE OF THIS PATIENT: 40 minutes.    Erandy Mceachern M.D on 12/28/2014 at 1:57 PM  Between 7am to 6pm - Pager - [phone] After 6pm go to [email or web address] - password [email or web address] [facility]  Office  [phone]  CC: Primary  care physician; Sherlyn Lees, FNP

## 2014-12-28 NOTE — Progress Notes (Signed)
Patient discharged to home and discharge instructions given as ordered. Patient given prescriptions as ordered. Patient is alert and oriented, no acute distress noted, ambulates without assistance.

## 2014-12-28 NOTE — Progress Notes (Signed)
Initial Nutrition Assessment  INTERVENTION:  Meals and Snacks: Cater to patient preferences Medical Food Supplement Therapy: will send Sugar Free Carnation Instant Breakfast daily for added nutrition    NUTRITION DIAGNOSIS:  Inadequate oral intake related to  (decreased appetite) as evidenced by per patient/family report.  GOAL:  Patient will meet greater than or equal to 90% of their needs  MONITOR:   (Energy Intake, Anthropometrics, Electrolyte and Renal Profile)  REASON FOR ASSESSMENT:  Malnutrition Screening Tool    ASSESSMENT:  Pt admitted with pyelonephritis with n/v.  PMHx:  Past Medical History  Diagnosis Date  . Diabetes mellitus without complication   . Pancreatitis   . COPD (chronic obstructive pulmonary disease)   . GERD (gastroesophageal reflux disease)   . Cancer   . Shingles   . HLD (hyperlipidemia)     Diet Order:  Diet heart healthy/carb modified Room service appropriate?: Yes; Fluid consistency:: Thin  Current Nutrition: Pt reports eating well this am 75% of meal atleast. Pt reports good appetite since admission.  Food/Nutrition-Related History: Pt reports appetite fluctuates PTA. Pt reports some days 'I can't eat at all' and some days the opposite. Pt reports taking 'shots' medication for DM that she has to eat with and therefore she tries to eat.    Medications: Cipro, novolog, Protonix   Electrolyte/Renal Profile and Glucose Profile:   Recent Labs Lab 12/27/14 1733 12/28/14 0640  NA 132* 134*  K 4.0 4.1  CL 99* 101  CO2 20* 25  BUN 11 11  CREATININE 0.56 0.50  CALCIUM 9.1 9.2  GLUCOSE 319* 332*   Protein Profile:  Recent Labs Lab 12/27/14 1733  ALBUMIN 4.1    Gastrointestinal Profile: Last BM: unknown   Nutrition-Focused Physical Exam Findings:  Nutrition-Focused physical exam completed. Findings are WDL for fat depletion, muscle depletion, and edema.    Weight Change: Pt reports weight of 198lbs 4 months ago (25%  weight loss) Anthropometrics:   Height:  Ht Readings from Last 1 Encounters:  12/27/14 5\' 1"  (1.549 m)    Weight:  Wt Readings from Last 1 Encounters:  12/28/14 150 lb 4.8 oz (68.176 kg)    Ideal Body Weight:   47.7kg  Wt Readings from Last 10 Encounters:  12/28/14 150 lb 4.8 oz (68.176 kg)    BMI:  Body mass index is 28.41 kg/(m^2).  Estimated Nutritional Needs:  Kcal:  1472-1739kcals, BEE: 1030kcals, TEE: (IF 1.1-1.3)(AF 1.3) using IBW of 47.7kg  Protein:  48-57g protein (1.0-1.2g/kg) using IBW of 47.7kg  Fluid:  1193-1473mL of fluid (25-22mL/kg) using IBW of 47.7kg   EDUCATION NEEDS:  No education needs identified at this time   [place], RD, LDN Pager [phone]

## 2014-12-29 LAB — URINE CULTURE
Culture: 50000
SPECIAL REQUESTS: NORMAL

## 2015-01-01 LAB — CULTURE, BLOOD (ROUTINE X 2)
Culture: NO GROWTH
Culture: NO GROWTH
SPECIAL REQUESTS: NORMAL
SPECIAL REQUESTS: NORMAL

## 2015-01-28 ENCOUNTER — Emergency Department
Admission: EM | Admit: 2015-01-28 | Discharge: 2015-01-28 | Disposition: A | Discharge status: Home / Self Care | Payer: Self-pay | Attending: Emergency Medicine | Admitting: Emergency Medicine

## 2015-01-28 ENCOUNTER — Encounter: Payer: Self-pay | Admitting: *Deleted

## 2015-01-28 DIAGNOSIS — Z7982 Long term (current) use of aspirin: Secondary | ICD-10-CM | POA: Insufficient documentation

## 2015-01-28 DIAGNOSIS — Z792 Long term (current) use of antibiotics: Secondary | ICD-10-CM | POA: Insufficient documentation

## 2015-01-28 DIAGNOSIS — E119 Type 2 diabetes mellitus without complications: Secondary | ICD-10-CM | POA: Insufficient documentation

## 2015-01-28 DIAGNOSIS — Z720 Tobacco use: Secondary | ICD-10-CM | POA: Insufficient documentation

## 2015-01-28 DIAGNOSIS — Z794 Long term (current) use of insulin: Secondary | ICD-10-CM | POA: Insufficient documentation

## 2015-01-28 DIAGNOSIS — K029 Dental caries, unspecified: Secondary | ICD-10-CM

## 2015-01-28 DIAGNOSIS — K047 Periapical abscess without sinus: Secondary | ICD-10-CM

## 2015-01-28 DIAGNOSIS — Z79899 Other long term (current) drug therapy: Secondary | ICD-10-CM | POA: Insufficient documentation

## 2015-01-28 MED ORDER — LIDOCAINE VISCOUS 2 % MT SOLN: 20.0000 mL | OROMUCOSAL | Status: DC | PRN

## 2015-01-28 MED ORDER — AMOXICILLIN 500 MG PO TABS: 500.0000 mg | ORAL_TABLET | Freq: Three times a day (TID) | ORAL | Status: DC

## 2015-01-28 MED ORDER — OXYCODONE-ACETAMINOPHEN 5-325 MG PO TABS
1.0000 | ORAL_TABLET | ORAL | Status: DC | PRN
Start: 2015-01-28 — End: 2017-03-21

## 2015-01-28 NOTE — Discharge Instructions (Signed)

## 2015-01-28 NOTE — ED Notes (Signed)

## 2015-01-28 NOTE — ED Provider Notes (Signed)
[facility] Emergency Department Provider Note  ____________________________________________  Time seen: Approximately 7:11 PM  I have reviewed the triage vital signs and the nursing notes.   HISTORY  Chief Complaint Dental Pain   HPI Renee Mack is a 49 y.o. female presenting to the emergency department with complaints of dental pain.  She has extensive dental decay and has had the majority of her teeth pulled by her dentist.  On Sunday she noticed severe pain on the right lower gum line.  She has also noticed a foul tasting fluid seems to be seeping from her lower gum.  She has plans to have the remaining teeth pulled but is unable to see Dr. Renard Hamper until next week at the earliest. She is concerned for an abscess and has a history of dental abscess.  OTC pain relievers were ineffective to control the pain.   Past Medical History  Diagnosis Date  . Diabetes mellitus without complication   . Pancreatitis   . COPD (chronic obstructive pulmonary disease)   . GERD (gastroesophageal reflux disease)   . Cancer   . Shingles   . HLD (hyperlipidemia)     Patient Active Problem List   Diagnosis Date Noted  . Pyelonephritis 12/28/2014  . Type 2 diabetes mellitus 12/28/2014  . COPD (chronic obstructive pulmonary disease) 12/28/2014  . GERD (gastroesophageal reflux disease) 12/28/2014  . History of shingles 12/28/2014  . HLD (hyperlipidemia) 12/28/2014    Past Surgical History  Procedure Laterality Date  . Lymphadenectomy    . Vulvulasectomy    . Splenectomy, partial    . Abdominal hysterectomy      partial  . Ectopic pregnancy surgery Left     Current Outpatient Rx  Name  Route  Sig  Dispense  Refill  . acyclovir (ZOVIRAX) 400 MG tablet   Oral   Take 500 mg by mouth 2 (two) times daily.         Marland Kitchen amoxicillin (AMOXIL) 500 MG tablet   Oral   Take 1 tablet (500 mg total) by mouth 3 (three) times daily.   30 tablet   0   . aspirin 81 MG tablet   Oral   Take 81 mg by mouth daily.         . ciprofloxacin (CIPRO) 500 MG tablet   Oral   Take 1 tablet (500 mg total) by mouth 2 (two) times daily.   14 tablet   0   . esomeprazole (NEXIUM) 40 MG capsule   Oral   Take 40 mg by mouth daily at 12 noon.         . insulin aspart (NOVOLOG) 100 UNIT/ML injection   Subcutaneous   Inject 20 Units into the skin 2 (two) times daily at 10 am and 4 pm.         . insulin glargine (LANTUS) 100 UNIT/ML injection   Subcutaneous   Inject 50 Units into the skin 2 (two) times daily.         Marland Kitchen lidocaine (XYLOCAINE) 2 % solution   Mouth/Throat   Use as directed 20 mLs in the mouth or throat as needed for mouth pain.   100 mL   0   . oxyCODONE-acetaminophen (ROXICET) 5-325 MG per tablet   Oral   Take 1-2 tablets by mouth every 4 (four) hours as needed for severe pain.   15 tablet   0     Allergies Metformin and related; Darvon; Gabapentin; and Nsaids  Family History  Problem Relation Age of Onset  . Cancer Mother   . Osteoporosis Sister   . Heart disease Brother     Social History History  Substance Use Topics  . Smoking status: Current Every Day Smoker    Types: Cigarettes  . Smokeless tobacco: Not on file  . Alcohol Use: No    Review of Systems Constitutional: No fever/chills Eyes: No visual changes. ENT: Dental pain left lower gum line Cardiovascular: Denies chest pain. Respiratory: Denies shortness of breath. Gastrointestinal: Foul tasting fluid from abscess is causing some nausea. Genitourinary: Negative for dysuria. Musculoskeletal: Negative for back pain. Skin: Negative for rash. Neurological: Negative for headaches, focal weakness or numbness.  10-point ROS otherwise negative.  ____________________________________________   PHYSICAL EXAM:  VITAL SIGNS: ED Triage Vitals  Enc Vitals Group     BP 01/28/15 1847 144/75 mmHg     Pulse Rate 01/28/15 1847 79     Resp 01/28/15 1847 20     Temp 01/28/15  1847 98.2 F (36.8 C)     Temp Source 01/28/15 1847 Oral     SpO2 01/28/15 1847 97 %     Weight 01/28/15 1847 151 lb (68.493 kg)     Height 01/28/15 1847 5\' 1"  (1.549 m)     Head Cir --      Peak Flow --      Pain Score 01/28/15 1848 10     Pain Loc --      Pain Edu? --      Excl. in [place]? --     Constitutional: Alert and oriented. Well appearing and in no acute distress. Eyes: Conjunctivae are normal. PERRL. EOMI. Head: Atraumatic. Nose: No congestion/rhinnorhea. Mouth/Throat: Severe dental decay and missing the majority of the lower teeth.  Pain localized to teeth 25 and 26 including the gum line. Neck: No tenderness Cardiovascular: Normal rate, regular rhythm. Grossly normal heart sounds.  Good peripheral circulation. Respiratory: Normal respiratory effort.  No retractions. Lungs CTAB. Gastrointestinal: Soft and nontender. No distention. No abdominal bruits. No CVA tenderness. Neurologic:  Normal speech and language. No gross focal neurologic deficits are appreciated. No gait instability. Skin:  Skin is warm, dry and intact. No rash noted. Psychiatric: Mood and affect are normal. Speech and behavior are normal.  ____________________________________________   LABS (all labs ordered are listed, but only abnormal results are displayed)  Labs Reviewed - No data to display ____________________________________________   PROCEDURES  Procedure(s) performed: None  Critical Care performed: No  ____________________________________________   INITIAL IMPRESSION / ASSESSMENT AND PLAN / ED COURSE  Pertinent labs & imaging results that were available during my care of the patient were reviewed by me and considered in my medical decision making (see chart for details).  Patient presented with complaint of dental pain and suspected dental abscess. Started on Amoxicillin for suspected abscess. Percocet and viscous lidocaine to control pain.  Patient will call Dr. Renard Hamper for follow-up  and continued treatment of extensive decay. ____________________________________________   FINAL CLINICAL IMPRESSION(S) / ED DIAGNOSES  Final diagnoses:  Dental abscess  Dental decay    Arlyss Repress, PA-C 01/28/15 2121  Delman Kitten, MD 01/28/15 2358

## 2015-01-28 NOTE — ED Notes (Signed)
Pt has right lower toothache   States tooth broke 3 days ago.

## 2016-02-16 ENCOUNTER — Emergency Department: Payer: Self-pay

## 2016-02-16 ENCOUNTER — Encounter: Payer: Self-pay | Admitting: Emergency Medicine

## 2016-02-16 ENCOUNTER — Emergency Department
Admission: EM | Admit: 2016-02-16 | Discharge: 2016-02-16 | Disposition: A | Discharge status: Home / Self Care | Payer: Self-pay | Attending: Student | Admitting: Student

## 2016-02-16 DIAGNOSIS — Z794 Long term (current) use of insulin: Secondary | ICD-10-CM | POA: Insufficient documentation

## 2016-02-16 DIAGNOSIS — Z7982 Long term (current) use of aspirin: Secondary | ICD-10-CM | POA: Insufficient documentation

## 2016-02-16 DIAGNOSIS — J449 Chronic obstructive pulmonary disease, unspecified: Secondary | ICD-10-CM | POA: Insufficient documentation

## 2016-02-16 DIAGNOSIS — K5900 Constipation, unspecified: Secondary | ICD-10-CM | POA: Insufficient documentation

## 2016-02-16 DIAGNOSIS — E1165 Type 2 diabetes mellitus with hyperglycemia: Secondary | ICD-10-CM | POA: Insufficient documentation

## 2016-02-16 DIAGNOSIS — R739 Hyperglycemia, unspecified: Secondary | ICD-10-CM

## 2016-02-16 DIAGNOSIS — F17210 Nicotine dependence, cigarettes, uncomplicated: Secondary | ICD-10-CM | POA: Insufficient documentation

## 2016-02-16 DIAGNOSIS — R112 Nausea with vomiting, unspecified: Secondary | ICD-10-CM | POA: Insufficient documentation

## 2016-02-16 DIAGNOSIS — R109 Unspecified abdominal pain: Secondary | ICD-10-CM

## 2016-02-16 DIAGNOSIS — Z8544 Personal history of malignant neoplasm of other female genital organs: Secondary | ICD-10-CM | POA: Insufficient documentation

## 2016-02-16 LAB — COMPREHENSIVE METABOLIC PANEL
ALK PHOS: 84 U/L (ref 38–126)
ALT: 23 U/L (ref 14–54)
AST: 44 U/L — AB (ref 15–41)
Albumin: 4 g/dL (ref 3.5–5.0)
Anion gap: 7 (ref 5–15)
BUN: 13 mg/dL (ref 6–20)
CALCIUM: 8.5 mg/dL — AB (ref 8.9–10.3)
CO2: 24 mmol/L (ref 22–32)
CREATININE: 0.54 mg/dL (ref 0.44–1.00)
Chloride: 101 mmol/L (ref 101–111)
GFR calc Af Amer: >60 mL/min (ref >60)
Glucose, Bld: 429 mg/dL — ABNORMAL HIGH (ref 65–99)
Potassium: 5 mmol/L (ref 3.5–5.1)
Sodium: 132 mmol/L — ABNORMAL LOW (ref 135–145)
Total Bilirubin: 1.1 mg/dL (ref 0.3–1.2)
Total Protein: 7.8 g/dL (ref 6.5–8.1)

## 2016-02-16 LAB — URINALYSIS COMPLETE WITH MICROSCOPIC (ARMC ONLY)
BILIRUBIN URINE: NEGATIVE
Bacteria, UA: NONE SEEN
Glucose, UA: >500 mg/dL — AB
HGB URINE DIPSTICK: NEGATIVE
Ketones, ur: NEGATIVE mg/dL
Nitrite: NEGATIVE
PH: 6 (ref 5.0–8.0)
PROTEIN: NEGATIVE mg/dL
Specific Gravity, Urine: 1.017 (ref 1.005–1.030)

## 2016-02-16 LAB — BLOOD GAS, VENOUS
Acid-base deficit: 3.1 mmol/L — ABNORMAL HIGH (ref 0.0–2.0)
Bicarbonate: 22.7 mEq/L (ref 21.0–28.0)
O2 Saturation: 62.9 %
PH VEN: 7.34 (ref 7.320–7.430)
PO2 VEN: 35 mmHg (ref 31.0–45.0)
Patient temperature: 37
pCO2, Ven: 42 mmHg — ABNORMAL LOW (ref 44.0–60.0)

## 2016-02-16 LAB — CBC WITH DIFFERENTIAL/PLATELET
BASOS ABS: 0.1 10*3/uL (ref 0–0.1)
Basophils Relative: 1 %
EOS PCT: 1 %
Eosinophils Absolute: 0.2 10*3/uL (ref 0–0.7)
HCT: 47.2 % — ABNORMAL HIGH (ref 35.0–47.0)
HEMOGLOBIN: 16.1 g/dL — AB (ref 12.0–16.0)
LYMPHS ABS: 3.5 10*3/uL (ref 1.0–3.6)
LYMPHS PCT: 29 %
MCH: 32 pg (ref 26.0–34.0)
MCHC: 34.1 g/dL (ref 32.0–36.0)
MCV: 93.7 fL (ref 80.0–100.0)
Monocytes Absolute: 0.5 10*3/uL (ref 0.2–0.9)
Monocytes Relative: 4 %
NEUTROS ABS: 7.6 10*3/uL — AB (ref 1.4–6.5)
NEUTROS PCT: 65 %
Platelets: 214 10*3/uL (ref 150–440)
RBC: 5.04 MIL/uL (ref 3.80–5.20)
RDW: 12.7 % (ref 11.5–14.5)
WBC: 11.8 10*3/uL — ABNORMAL HIGH (ref 3.6–11.0)

## 2016-02-16 LAB — GLUCOSE, CAPILLARY: Glucose-Capillary: 269 mg/dL — ABNORMAL HIGH (ref 65–99)

## 2016-02-16 LAB — TROPONIN I: Troponin I: <0.03 ng/mL (ref <0.03)

## 2016-02-16 LAB — LIPASE, BLOOD: LIPASE: 19 U/L (ref 11–51)

## 2016-02-16 MED ORDER — DIATRIZOATE MEGLUMINE & SODIUM 66-10 % PO SOLN
15.0000 mL | ORAL | Status: AC
  Administered 2016-02-16: 15 mL via ORAL
  Administered 2016-02-16: 30 mL via ORAL

## 2016-02-16 MED ORDER — SODIUM CHLORIDE 0.9 % IV BOLUS (SEPSIS)
1000.0000 mL | Freq: Once | INTRAVENOUS | Status: AC
  Administered 2016-02-16: 1000 mL via INTRAVENOUS

## 2016-02-16 MED ORDER — TRAMADOL HCL 50 MG PO TABS: 50.0000 mg | ORAL_TABLET | Freq: Four times a day (QID) | ORAL | 0 refills | Status: DC | PRN

## 2016-02-16 MED ORDER — IOPAMIDOL (ISOVUE-300) INJECTION 61%
100.0000 mL | Freq: Once | INTRAVENOUS | Status: AC | PRN
  Administered 2016-02-16: 100 mL via INTRAVENOUS

## 2016-02-16 MED ORDER — ONDANSETRON HCL 4 MG/2ML IJ SOLN
4.0000 mg | Freq: Once | INTRAMUSCULAR | Status: AC
  Administered 2016-02-16: 4 mg via INTRAVENOUS
  Filled 2016-02-16: qty 2

## 2016-02-16 MED ORDER — POLYETHYLENE GLYCOL 3350 17 GM/SCOOP PO POWD: ORAL | 0 refills | Status: DC

## 2016-02-16 MED ORDER — INSULIN ASPART 100 UNIT/ML ~~LOC~~ SOLN
8.0000 [IU] | Freq: Once | SUBCUTANEOUS | Status: AC
  Administered 2016-02-16: 8 [IU] via SUBCUTANEOUS
  Filled 2016-02-16: qty 8

## 2016-02-16 MED ORDER — ONDANSETRON 4 MG PO TBDP: 4.0000 mg | ORAL_TABLET | Freq: Three times a day (TID) | ORAL | 0 refills | Status: DC | PRN

## 2016-02-16 MED ORDER — MORPHINE SULFATE (PF) 4 MG/ML IV SOLN
4.0000 mg | Freq: Once | INTRAVENOUS | Status: AC
  Administered 2016-02-16: 4 mg via INTRAVENOUS
  Filled 2016-02-16: qty 1

## 2016-02-16 MED ORDER — MORPHINE SULFATE (PF) 4 MG/ML IV SOLN
4.0000 mg | Freq: Once | INTRAVENOUS | Status: AC
Start: 2016-02-16 — End: 2016-02-16
  Administered 2016-02-16: 4 mg via INTRAVENOUS
  Filled 2016-02-16: qty 1

## 2016-02-16 NOTE — ED Notes (Signed)
Pt reports she will have family pick her up. Family in lobby at this time to drive pt home.

## 2016-02-16 NOTE — ED Notes (Signed)
Assisted the patient to the restroom. 

## 2016-02-16 NOTE — ED Triage Notes (Signed)
Pt presents with abd pain and high blood sugar, unable to get blood sugar down. Pt states blood sugar is 489.

## 2016-02-16 NOTE — ED Provider Notes (Signed)
[facility] Emergency Department Provider Note   ____________________________________________   First MD Initiated Contact with Patient 02/16/16 0710     (approximate)  I have reviewed the triage vital signs and the nursing notes.   HISTORY  Chief Complaint Abdominal Pain and Hyperglycemia    HPI Renee Mack is a 50 y.o. female with history of type 2 diabetes with previous episodes of DKA, idiopathic pancreatitis, history of vulvar cancer surgically treated, COPD, hyperlipidemia who presents for evaluation of 2-3 days of abdominal pain radiating to her back, gradual onset, constant, severe, no modifying factors. Patient reports that this is been "coming on for a few days" but  was severe last night. She is also complaining of lower abdominal pain radiating into the left flank as well as some painful urination. She reports that her blood glucoses have been difficult to control yesterday and today, her blood sugar yesterday was approximately 490. She had an episode of nonbloody nonbilious emesis today, no diarrhea, no fevers or chills. No chest pain or difficulty breathing. She has had cough.   Past Medical History:  Diagnosis Date  . Cancer ([place])   . COPD (chronic obstructive pulmonary disease) ([place])   . Diabetes mellitus without complication ([place])   . GERD (gastroesophageal reflux disease)   . HLD (hyperlipidemia)   . Pancreatitis   . Shingles     Patient Active Problem List   Diagnosis Date Noted  . Pyelonephritis 12/28/2014  . Type 2 diabetes mellitus ([place]) 12/28/2014  . COPD (chronic obstructive pulmonary disease) ([place]) 12/28/2014  . GERD (gastroesophageal reflux disease) 12/28/2014  . History of shingles 12/28/2014  . HLD (hyperlipidemia) 12/28/2014    Past Surgical History:  Procedure Laterality Date  . ABDOMINAL HYSTERECTOMY     partial  . ECTOPIC PREGNANCY SURGERY Left   . LYMPHADENECTOMY    . SPLENECTOMY, PARTIAL    .  vulvulasectomy      Prior to Admission medications   Medication Sig Start Date End Date Taking? Authorizing Provider  acyclovir (ZOVIRAX) 400 MG tablet Take 500 mg by mouth 2 (two) times daily.    Historical Provider, MD  amoxicillin (AMOXIL) 500 MG tablet Take 1 tablet (500 mg total) by mouth 3 (three) times daily. 01/28/15   Arlyss Repress, PA-C  aspirin 81 MG tablet Take 81 mg by mouth daily.    Historical Provider, MD  ciprofloxacin (CIPRO) 500 MG tablet Take 1 tablet (500 mg total) by mouth 2 (two) times daily. 12/28/14   Fritzi Mandes, MD  esomeprazole (NEXIUM) 40 MG capsule Take 40 mg by mouth daily at 12 noon.    Historical Provider, MD  insulin aspart (NOVOLOG) 100 UNIT/ML injection Inject 20 Units into the skin 2 (two) times daily at 10 am and 4 pm.    Historical Provider, MD  insulin glargine (LANTUS) 100 UNIT/ML injection Inject 50 Units into the skin 2 (two) times daily.    Historical Provider, MD  lidocaine (XYLOCAINE) 2 % solution Use as directed 20 mLs in the mouth or throat as needed for mouth pain. 01/28/15   Pierce Crane Beers, PA-C  oxyCODONE-acetaminophen (ROXICET) 5-325 MG per tablet Take 1-2 tablets by mouth every 4 (four) hours as needed for severe pain. 01/28/15   Arlyss Repress, PA-C    Allergies Metformin and related; Darvon [propoxyphene]; Gabapentin; and Nsaids  Family History  Problem Relation Age of Onset  . Cancer Mother   . Osteoporosis Sister   . Heart disease Brother  Social History Social History  Substance Use Topics  . Smoking status: Current Every Day Smoker    Types: Cigarettes  . Smokeless tobacco: Never Used  . Alcohol use No    Review of Systems Constitutional: No fever/chills Eyes: No visual changes. ENT: No sore throat. Cardiovascular: Denies chest pain. Respiratory: Denies shortness of breath. Gastrointestinal: + abdominal pain.  + nausea, +vomiting.  No diarrhea.  No constipation. Genitourinary: Negative for dysuria. Musculoskeletal:  Negative for back pain. Skin: Negative for rash. Neurological: Negative for headaches, focal weakness or numbness.  10-point ROS otherwise negative.  ____________________________________________   PHYSICAL EXAM:  VITAL SIGNS: ED Triage Vitals  Enc Vitals Group     BP 02/16/16 0701 136/79     Pulse Rate 02/16/16 0701 82     Resp 02/16/16 0701 20     Temp 02/16/16 0701 97.6 F (36.4 C)     Temp Source 02/16/16 0701 Oral     SpO2 02/16/16 0701 99 %     Weight 02/16/16 0701 136 lb (61.7 kg)     Height 02/16/16 0701 5\' 1"  (1.549 m)     Head Circumference --      Peak Flow --      Pain Score 02/16/16 0703 10     Pain Loc --      Pain Edu? --      Excl. in [place]? --     Constitutional: Alert and oriented. Nontoxic appearing and in no acute distress. Eyes: Conjunctivae are normal. PERRL. EOMI. Head: Atraumatic. Nose: No congestion/rhinnorhea. Mouth/Throat: Mucous membranes are moist.  Oropharynx non-erythematous. Neck: No stridor.  Supple without meningismus. Cardiovascular: Normal rate, regular rhythm. Grossly normal heart sounds.  Good peripheral circulation. Respiratory: Normal respiratory effort.  No retractions. Lungs CTAB. Gastrointestinal: Notable bowel sounds, soft with tenderness to palpation in the epigastrium, super pubic region, left lower quadrant without rebound, guarding, or rigidity. No CVA tenderness. Genitourinary: deferred Musculoskeletal: No lower extremity tenderness nor edema.  No joint effusions. Neurologic:  Normal speech and language. No gross focal neurologic deficits are appreciated.  Skin:  Skin is warm, dry and intact. No rash noted. Psychiatric: Mood and affect are normal. Speech and behavior are normal.  ____________________________________________   LABS (all labs ordered are listed, but only abnormal results are displayed)  Labs Reviewed  URINALYSIS COMPLETEWITH MICROSCOPIC ([facility] ONLY) - Abnormal; Notable for the following:       Result  Value   Color, Urine COLORLESS (*)    APPearance CLEAR (*)    Glucose, UA >500 (*)    Leukocytes, UA TRACE (*)    Squamous Epithelial / LPF 0-5 (*)    All other components within normal limits  BLOOD GAS, VENOUS - Abnormal; Notable for the following:    pCO2, Ven 42 (*)    Acid-base deficit 3.1 (*)    All other components within normal limits  CBC WITH DIFFERENTIAL/PLATELET - Abnormal; Notable for the following:    WBC 11.8 (*)    Hemoglobin 16.1 (*)    HCT 47.2 (*)    Neutro Abs 7.6 (*)    All other components within normal limits  COMPREHENSIVE METABOLIC PANEL - Abnormal; Notable for the following:    Sodium 132 (*)    Glucose, Bld 429 (*)    Calcium 8.5 (*)    AST 44 (*)    All other components within normal limits  GLUCOSE, CAPILLARY - Abnormal; Notable for the following:    Glucose-Capillary 269 (*)  All other components within normal limits  URINE CULTURE  LIPASE, BLOOD  TROPONIN I  CBC WITH DIFFERENTIAL/PLATELET   ____________________________________________  EKG  ED ECG REPORT I, Joanne Gavel, the attending physician, personally viewed and interpreted this ECG.   Date: 02/16/2016  EKG Time: 07:56  Rate: 67  Rhythm: normal sinus rhythm  Axis: normal  Intervals:none  ST&T Change: No acute ST elevation or acute ST depression. Chronic Q waves are noted in V1, V2.  ____________________________________________  RADIOLOGY  CXR IMPRESSION:  No acute cardiopulmonary disease.     CT abdomen and pelvis IMPRESSION:  Mild fullness of the right renal collecting system and proximal  ureter without visible obstructing process or stone.    Small ventral wall hernia containing a small focal area of the  anterior transverse colonic wall. No obstruction.    Moderate stool burden.    Aorta iliac atherosclerosis.      ____________________________________________   PROCEDURES  Procedure(s) performed: None  Procedures  Critical Care performed:  No  ____________________________________________   INITIAL IMPRESSION / ASSESSMENT AND PLAN / ED COURSE  Pertinent labs & imaging results that were available during my care of the patient were reviewed by me and considered in my medical decision making (see chart for details).  Renee Mack is a 50 y.o. female with history of type 2 diabetes with previous episodes of DKA, idiopathic pancreatitis, history of vulvar cancer surgically treated, COPD, hyperlipidemia who presents for evaluation of 2-3 days of abdominal pain radiating to her back. On exam, she is nontoxic appearing and in no acute distress, vital signs stable and she is afebrile. She does have tenderness to palpation in the epigastrium as well as throughout the lower abdomen. Not consistent with acute ischemia. We'll obtain screening abdominal pain labs, VBG, give IV fluids, treat her hyperglycemia, pain and nausea, reassess for disposition.  ----------------------------------------- 11:46 AM on 02/16/2016 ----------------------------------------- She reports significant improvement of symptoms at this time, she reports improvement of her pain, she is no longer vomiting, tolerating by mouth intake. Her initial glucose was 429, normal bicarbonate, anion gap not consistent with DKA. At this time, blood sugar has improved to 269 with fluids and insulin. He showed mild leukocytosis as well as hemoconcentration, patient received IV fluids as above. Normal lipase. Troponin negative. UrinAnalysis was not consistent with infection but cultures sent. Chest x-ray showed no acute Cardiopulmonarydisease. Because of her abdominal tenderness and mild leukocytosis, CT of the abdomen and pelvis was obtained which shows moderate stool burden/constipation as well as nonspecific fullness in the right renal collecting system without obstruction. I discussed with the patient we will discharge her with very short course of tramadol for pain, Zofran as well as  MiraLAX. We discussed meticulous return precautions and she is comfortable with the discharge plan. DC home.   Clinical Course     ____________________________________________   FINAL CLINICAL IMPRESSION(S) / ED DIAGNOSES  Final diagnoses:  Abdominal pain, unspecified abdominal location  Acute hyperglycemia  Constipation, unspecified constipation type      NEW MEDICATIONS STARTED DURING THIS VISIT:  New Prescriptions   No medications on file     Note:  This document was prepared using Dragon voice recognition software and may include unintentional dictation errors.    Joanne Gavel, MD 02/16/16 1210

## 2016-02-17 LAB — URINE CULTURE: Culture: NO GROWTH

## 2016-05-02 ENCOUNTER — Encounter: Payer: Self-pay | Admitting: Emergency Medicine

## 2016-05-02 ENCOUNTER — Emergency Department
Admission: EM | Admit: 2016-05-02 | Discharge: 2016-05-02 | Disposition: A | Discharge status: Home / Self Care | Payer: Self-pay | Attending: Emergency Medicine | Admitting: Emergency Medicine

## 2016-05-02 DIAGNOSIS — Z794 Long term (current) use of insulin: Secondary | ICD-10-CM | POA: Insufficient documentation

## 2016-05-02 DIAGNOSIS — Z79899 Other long term (current) drug therapy: Secondary | ICD-10-CM | POA: Insufficient documentation

## 2016-05-02 DIAGNOSIS — K047 Periapical abscess without sinus: Secondary | ICD-10-CM | POA: Insufficient documentation

## 2016-05-02 DIAGNOSIS — E119 Type 2 diabetes mellitus without complications: Secondary | ICD-10-CM | POA: Insufficient documentation

## 2016-05-02 DIAGNOSIS — Z7982 Long term (current) use of aspirin: Secondary | ICD-10-CM | POA: Insufficient documentation

## 2016-05-02 DIAGNOSIS — F17210 Nicotine dependence, cigarettes, uncomplicated: Secondary | ICD-10-CM | POA: Insufficient documentation

## 2016-05-02 DIAGNOSIS — J449 Chronic obstructive pulmonary disease, unspecified: Secondary | ICD-10-CM | POA: Insufficient documentation

## 2016-05-02 LAB — GLUCOSE, CAPILLARY: GLUCOSE-CAPILLARY: 404 mg/dL — AB (ref 65–99)

## 2016-05-02 MED ORDER — PENICILLIN V POTASSIUM 500 MG PO TABS: 500.0000 mg | ORAL_TABLET | Freq: Four times a day (QID) | ORAL | 0 refills | Status: DC

## 2016-05-02 MED ORDER — KETOROLAC TROMETHAMINE 30 MG/ML IJ SOLN
30.0000 mg | Freq: Once | INTRAMUSCULAR | Status: AC
  Administered 2016-05-02: 30 mg via INTRAMUSCULAR
  Filled 2016-05-02: qty 1

## 2016-05-02 MED ORDER — HYDROCODONE-ACETAMINOPHEN 5-325 MG PO TABS: 1.0000 | ORAL_TABLET | Freq: Four times a day (QID) | ORAL | 0 refills | Status: DC | PRN

## 2016-05-02 NOTE — ED Notes (Signed)

## 2016-05-02 NOTE — ED Provider Notes (Signed)
Time Seen: Approximately 1733  I have reviewed the triage notes  Chief Complaint: Dental Pain   History of Present Illness: Renee Mack is a 50 y.o. female who presents with some increased pain and discomfort over a lower incisor region. Patient's had a long history of dentalgia with resection of all her upper teeth. She states the pain and some mild swelling has developed over the weekend. She denies any high fever at home. She has had some very labile blood sugars at home which is not unusual for her when she gets a dental infection. She does admit that she's had a decreased appetite and has not been using her insulin appropriately. She denies any frequency of urination, fever, abdominal pain   Past Medical History:  Diagnosis Date  . Cancer ([place])   . COPD (chronic obstructive pulmonary disease) ([place])   . Diabetes mellitus without complication ([place])   . GERD (gastroesophageal reflux disease)   . HLD (hyperlipidemia)   . Pancreatitis   . Shingles     Patient Active Problem List   Diagnosis Date Noted  . Pyelonephritis 12/28/2014  . Type 2 diabetes mellitus ([place]) 12/28/2014  . COPD (chronic obstructive pulmonary disease) ([place]) 12/28/2014  . GERD (gastroesophageal reflux disease) 12/28/2014  . History of shingles 12/28/2014  . HLD (hyperlipidemia) 12/28/2014    Past Surgical History:  Procedure Laterality Date  . ABDOMINAL HYSTERECTOMY     partial  . ECTOPIC PREGNANCY SURGERY Left   . LYMPHADENECTOMY    . SPLENECTOMY, PARTIAL    . vulvulasectomy      Past Surgical History:  Procedure Laterality Date  . ABDOMINAL HYSTERECTOMY     partial  . ECTOPIC PREGNANCY SURGERY Left   . LYMPHADENECTOMY    . SPLENECTOMY, PARTIAL    . vulvulasectomy      Current Outpatient Rx  . Order #: [id number] Class: Historical Med  . Order #: [id number] Class: Print  . Order #: [id number] Class: Historical Med  . Order #: [id number] Class: Normal  . Order #: [id number] Class: Historical Med   . Order #: [id number] Class: Print  . Order #: [id number] Class: Historical Med  . Order #: [id number] Class: Historical Med  . Order #: [id number] Class: Print  . Order #: [id number] Class: Print  . Order #: [id number] Class: Print  . Order #: [id number] Class: Print  . Order #: [id number] Class: Print  . Order #: [id number] Class: Print    Allergies:  Metformin and related; Darvon [propoxyphene]; Gabapentin; and Nsaids  Family History: Family History  Problem Relation Age of Onset  . Cancer Mother   . Osteoporosis Sister   . Heart disease Brother     Social History: Social History  Substance Use Topics  . Smoking status: Current Every Day Smoker    Types: Cigarettes  . Smokeless tobacco: Never Used  . Alcohol use No     Review of Systems:   Constitutional: No fever Eyes: No visual disturbances ENT: No sore throat, ear pain Cardiac: No chest pain Respiratory: No shortness of breath, wheezing, or stridor Abdomen: No abdominal pain, no vomiting, No diarrhea Endocrine: No weight loss, No night sweats Neurologic: No focal weakness, trouble with speech or swollowing. No trismus Urologic: No dysuria, Hematuria, or urinary frequency   Physical Exam:  ED Triage Vitals  Enc Vitals Group     BP 05/02/16 1648 126/79     Pulse Rate 05/02/16 1648 97     Resp 05/02/16 1648 16     Temp 05/02/16 1648 98.3 F (36.8 C)  Temp Source 05/02/16 1648 Oral     SpO2 05/02/16 1648 98 %     Weight 05/02/16 1647 135 lb (61.2 kg)     Height 05/02/16 1647 5\' 1"  (1.549 m)     Head Circumference --      Peak Flow --      Pain Score 05/02/16 1647 10     Pain Loc --      Pain Edu? --      Excl. in [place]? --     General: Awake , Alert , and Oriented times 3; GCS 15 Head: Normal cephalic , atraumatic Eyes: Pupils equal , round, reactive to light Nose/Throat: No nasal drainage, patent upper airway without erythema or exudate. Patient has extremely poor dentition with erosion of her lower incisors  with tenderness. No obvious trismus or swelling. Neck: Supple, Full range of motion, No anterior adenopathy or palpable thyroid masses Lungs: Clear to ascultation without wheezes , rhonchi, or rales Heart: Regular rate, regular rhythm without murmurs , gallops , or rubs Abdomen: Soft, non tender without rebound, guarding , or rigidity; bowel sounds positive and symmetric in all 4 quadrants. No organomegaly .          Labs:   All laboratory work was reviewed including any pertinent negatives or positives listed below:  Labs Reviewed  GLUCOSE, CAPILLARY - Abnormal; Notable for the following:       Result Value   Glucose-Capillary 404 (*)    All other components within normal limits     ED Course:  Patient's allergies is to nonsteroidals and being that causes gastric irritation. Patient was given IM Toradol here and advised to continue with Tylenol around-the-clock for pain and was prescribed penicillin along with Vicodin. Patient was encouraged to contact her dentist and does have one locally. Continue to adjust and take her insulin for hyperglycemia. Clinical Course      Assessment: Dental abscesses   Final Clinical Impression:   Final diagnoses:  Dental infection     Plan:  Outpatient " New Prescriptions   HYDROCODONE-ACETAMINOPHEN (NORCO) 5-325 MG TABLET    Take 1 tablet by mouth every 6 (six) hours as needed for moderate pain.   PENICILLIN V POTASSIUM (VEETID) 500 MG TABLET    Take 1 tablet (500 mg total) by mouth 4 (four) times daily.  " Patient was advised to return immediately if condition worsens. Patient was advised to follow up with their primary care physician or other specialized physicians involved in their outpatient care. The patient and/or family member/power of attorney had laboratory results reviewed at the bedside. All questions and concerns were addressed and appropriate discharge instructions were distributed by the nursing staff.             Daymon Larsen, MD 05/02/16 [phone]

## 2016-05-02 NOTE — ED Notes (Signed)
Glucose value 404.

## 2016-05-02 NOTE — ED Triage Notes (Signed)
Patient presents to the ED with pain to her front two bottom teeth and surrounding gums x 2 days. Patient reports chronic dental pain but states her pain is currently much worse than normal and causing her to have difficulty sleeping.

## 2016-05-02 NOTE — ED Notes (Signed)
See triage note. Pt states she has a dentist but was unable to secure a timely appt. Pt concerned about possible dental abscess. Has future appt to have all bottom teeth pulled.

## 2016-05-02 NOTE — Discharge Instructions (Signed)
Please return immediately if condition worsens. Please contact her primary physician or the physician you were given for referral. If you have any specialist physicians involved in her treatment and plan please also contact them. Thank you for using [facility].  Return especially for Loc tell, fever, increased swelling, high a persistent blood sugars, or any other new concerns. Please contact her dentist in the morning and inform them of your visit here to the emergency department.

## 2017-01-30 ENCOUNTER — Encounter: Payer: Self-pay | Admitting: Emergency Medicine

## 2017-01-30 ENCOUNTER — Emergency Department
Admission: EM | Admit: 2017-01-30 | Discharge: 2017-01-30 | Disposition: A | Discharge status: Home / Self Care | Payer: Self-pay | Attending: Emergency Medicine | Admitting: Emergency Medicine

## 2017-01-30 ENCOUNTER — Emergency Department: Payer: Self-pay

## 2017-01-30 DIAGNOSIS — Z79899 Other long term (current) drug therapy: Secondary | ICD-10-CM | POA: Insufficient documentation

## 2017-01-30 DIAGNOSIS — Z794 Long term (current) use of insulin: Secondary | ICD-10-CM | POA: Insufficient documentation

## 2017-01-30 DIAGNOSIS — F4320 Adjustment disorder, unspecified: Secondary | ICD-10-CM | POA: Insufficient documentation

## 2017-01-30 DIAGNOSIS — F419 Anxiety disorder, unspecified: Secondary | ICD-10-CM | POA: Insufficient documentation

## 2017-01-30 DIAGNOSIS — Z7982 Long term (current) use of aspirin: Secondary | ICD-10-CM | POA: Insufficient documentation

## 2017-01-30 DIAGNOSIS — E119 Type 2 diabetes mellitus without complications: Secondary | ICD-10-CM | POA: Insufficient documentation

## 2017-01-30 DIAGNOSIS — F17210 Nicotine dependence, cigarettes, uncomplicated: Secondary | ICD-10-CM | POA: Insufficient documentation

## 2017-01-30 DIAGNOSIS — R002 Palpitations: Secondary | ICD-10-CM | POA: Insufficient documentation

## 2017-01-30 DIAGNOSIS — R079 Chest pain, unspecified: Secondary | ICD-10-CM | POA: Insufficient documentation

## 2017-01-30 DIAGNOSIS — F4321 Adjustment disorder with depressed mood: Secondary | ICD-10-CM

## 2017-01-30 DIAGNOSIS — J449 Chronic obstructive pulmonary disease, unspecified: Secondary | ICD-10-CM | POA: Insufficient documentation

## 2017-01-30 LAB — CBC
HCT: 45.2 % (ref 35.0–47.0)
Hemoglobin: 15.5 g/dL (ref 12.0–16.0)
MCH: 31.3 pg (ref 26.0–34.0)
MCHC: 34.2 g/dL (ref 32.0–36.0)
MCV: 91.5 fL (ref 80.0–100.0)
PLATELETS: 226 10*3/uL (ref 150–440)
RBC: 4.94 MIL/uL (ref 3.80–5.20)
RDW: 13.8 % (ref 11.5–14.5)
WBC: 10.8 10*3/uL (ref 3.6–11.0)

## 2017-01-30 LAB — BASIC METABOLIC PANEL
Anion gap: 15 (ref 5–15)
BUN: 18 mg/dL (ref 6–20)
CHLORIDE: 104 mmol/L (ref 101–111)
CO2: 16 mmol/L — ABNORMAL LOW (ref 22–32)
CREATININE: 0.69 mg/dL (ref 0.44–1.00)
Calcium: 9.6 mg/dL (ref 8.9–10.3)
GFR calc Af Amer: >60 mL/min (ref >60)
Glucose, Bld: 295 mg/dL — ABNORMAL HIGH (ref 65–99)
Potassium: 3.7 mmol/L (ref 3.5–5.1)
SODIUM: 135 mmol/L (ref 135–145)

## 2017-01-30 LAB — HEPATIC FUNCTION PANEL
ALBUMIN: 3.6 g/dL (ref 3.5–5.0)
ALT: 7 U/L — ABNORMAL LOW (ref 14–54)
AST: 12 U/L — ABNORMAL LOW (ref 15–41)
Alkaline Phosphatase: 70 U/L (ref 38–126)
BILIRUBIN INDIRECT: 1.4 mg/dL — AB (ref 0.3–0.9)
BILIRUBIN TOTAL: 1.5 mg/dL — AB (ref 0.3–1.2)
Bilirubin, Direct: 0.1 mg/dL (ref 0.1–0.5)
Total Protein: 7.6 g/dL (ref 6.5–8.1)

## 2017-01-30 LAB — GLUCOSE, CAPILLARY: Glucose-Capillary: 312 mg/dL — ABNORMAL HIGH (ref 65–99)

## 2017-01-30 LAB — TROPONIN I
Troponin I: <0.03 ng/mL (ref <0.03)
Troponin I: <0.03 ng/mL (ref <0.03)

## 2017-01-30 LAB — CK: CK TOTAL: 35 U/L — AB (ref 38–234)

## 2017-01-30 LAB — TSH: TSH: 0.7 u[IU]/mL (ref 0.350–4.500)

## 2017-01-30 MED ORDER — ASPIRIN 81 MG PO CHEW
162.0000 mg | CHEWABLE_TABLET | Freq: Once | ORAL | Status: AC
  Administered 2017-01-30: 162 mg via ORAL

## 2017-01-30 MED ORDER — ASPIRIN 81 MG PO CHEW
CHEWABLE_TABLET | ORAL | Status: AC
  Filled 2017-01-30: qty 2

## 2017-01-30 MED ORDER — LORAZEPAM 1 MG PO TABS: 1.0000 mg | ORAL_TABLET | Freq: Two times a day (BID) | ORAL | 0 refills | Status: DC | PRN

## 2017-01-30 MED ORDER — LORAZEPAM 1 MG PO TABS
1.0000 mg | ORAL_TABLET | Freq: Once | ORAL | Status: AC
  Administered 2017-01-30: 1 mg via ORAL

## 2017-01-30 MED ORDER — LORAZEPAM 1 MG PO TABS
ORAL_TABLET | ORAL | Status: AC
  Filled 2017-01-30: qty 1

## 2017-01-30 NOTE — ED Notes (Signed)
Pt verbalizes a decrease in anxiety since ativan at this time

## 2017-01-30 NOTE — Progress Notes (Signed)
CH responded to a PG for room ED-25. Pt lost her husband of 18 years 3 days ago, and is still feeling the raw pain of her loss. Pt stated that she misses him terribly. Pt stated that she has tried to hold in her grief, but "the dam burst and I can't stop crying." CH offered that her feelings were perfectly normal and that her state of "being real" is where God can meet her best. CH offered a prayer for comfort. Pt seemed more at ease. CH is available for follow up as needed.    01/30/17 1500  Clinical Encounter Type  Visited With Patient  Visit Type Initial;Spiritual support;ED  Referral From Nurse  Consult/Referral To Chaplain  Spiritual Encounters  Spiritual Needs Prayer;Emotional

## 2017-01-30 NOTE — ED Notes (Signed)
Pt given water at this time per request, MD aware

## 2017-01-30 NOTE — ED Notes (Signed)
Pt states she is feeling anxious again and "heart is racing" , MD made aware

## 2017-01-30 NOTE — ED Notes (Signed)
Pt verbalizes understanding of d/c teaching and rx. Pt VS stable, NAD noted at time of d/c. Pt ambulatory to lobby.

## 2017-01-30 NOTE — ED Triage Notes (Signed)
FIRST NURSE NOTE-feels like heart racing and going to jump out of chest. Husband recently passed away.

## 2017-01-30 NOTE — ED Notes (Signed)
ED Provider at bedside. 

## 2017-01-30 NOTE — Discharge Instructions (Signed)
Fortunately today your blood work was reassuring, however I would like you to follow up with a cardiologist within the next week or so for reevaluation. Return to the emergency department sooner for any concerns whatsoever.  It was a pleasure to take care of you today, and thank you for coming to our emergency department.  If you have any questions or concerns before leaving please ask the nurse to grab me and I'm more than happy to go through your aftercare instructions again.  If you were prescribed any opioid pain medication today such as Norco, Vicodin, Percocet, morphine, hydrocodone, or oxycodone please make sure you do not drive when you are taking this medication as it can alter your ability to drive safely.  If you have any concerns once you are home that you are not improving or are in fact getting worse before you can make it to your follow-up appointment, please do not hesitate to call 911 and come back for further evaluation.  Darel Hong, MD  Results for orders placed or performed during the hospital encounter of 03/55/97  Basic metabolic panel  Result Value Ref Range   Sodium 135 135 - 145 mmol/L   Potassium 3.7 3.5 - 5.1 mmol/L   Chloride 104 101 - 111 mmol/L   CO2 16 (L) 22 - 32 mmol/L   Glucose, Bld 295 (H) 65 - 99 mg/dL   BUN 18 6 - 20 mg/dL   Creatinine, Ser 0.69 0.44 - 1.00 mg/dL   Calcium 9.6 8.9 - 10.3 mg/dL   GFR calc non Af Amer >60 >60 mL/min   GFR calc Af Amer >60 >60 mL/min   Anion gap 15 5 - 15  CBC  Result Value Ref Range   WBC 10.8 3.6 - 11.0 K/uL   RBC 4.94 3.80 - 5.20 MIL/uL   Hemoglobin 15.5 12.0 - 16.0 g/dL   HCT 45.2 35.0 - 47.0 %   MCV 91.5 80.0 - 100.0 fL   MCH 31.3 26.0 - 34.0 pg   MCHC 34.2 32.0 - 36.0 g/dL   RDW 13.8 11.5 - 14.5 %   Platelets 226 150 - 440 K/uL  Troponin I  Result Value Ref Range   Troponin I <0.03 <0.03 ng/mL  Hepatic function panel  Result Value Ref Range   Total Protein 7.6 6.5 - 8.1 g/dL   Albumin 3.6 3.5 - 5.0  g/dL   AST 12 (L) 15 - 41 U/L   ALT 7 (L) 14 - 54 U/L   Alkaline Phosphatase 70 38 - 126 U/L   Total Bilirubin 1.5 (H) 0.3 - 1.2 mg/dL   Bilirubin, Direct 0.1 0.1 - 0.5 mg/dL   Indirect Bilirubin 1.4 (H) 0.3 - 0.9 mg/dL  TSH  Result Value Ref Range   TSH 0.700 0.350 - 4.500 uIU/mL  CK  Result Value Ref Range   Total CK 35 (L) 38 - 234 U/L  Glucose, capillary  Result Value Ref Range   Glucose-Capillary 312 (H) 65 - 99 mg/dL  Troponin I  Result Value Ref Range   Troponin I <0.03 <0.03 ng/mL   Dg Chest 2 View  Result Date: 01/30/2017 CLINICAL DATA:  Tachycardia and chest pain EXAM: CHEST  2 VIEW COMPARISON:  02/16/2016 FINDINGS: Cardiac shadow is within normal limits. Aortic calcifications are again noted. Mild interstitial changes are seen slightly more prominent than that on the prior exam. No focal infiltrate or sizable effusion is seen. No acute bony abnormality is noted. IMPRESSION: No acute abnormality  noted. Electronically Signed   By: Inez Catalina M.D.   On: 01/30/2017 15:58

## 2017-01-30 NOTE — ED Triage Notes (Signed)
Pt arrived via POV from home reports feeling like her heart is racing for the past several days. Pt lost her husband on Thursday and states she hasn't been able to eat or sleep well and feels like her heart is jumping out of her chest. Pt states she was told she has a part of her heart that doesn't pump and will need a stent eventually. Pt is a diabetic as well and has not checked her sugar in several days.

## 2017-01-30 NOTE — ED Provider Notes (Signed)
[facility] Emergency Department Provider Note  ____________________________________________   First MD Initiated Contact with Patient 01/30/17 1504     (approximate)  I have reviewed the triage vital signs and the nursing notes.   HISTORY  Chief Complaint Chest Pain and Tachycardia    HPI Renee Mack is a 51 y.o. female who self presents to the emergency department with chest pain and palpitations for the past 4 days. She feels like her heart races intermittently and she has several minute episodes of upper chest discomfort. The pain is moderate severity nonradiating. Not associated with shortness of breath. Nonexertional. She does have a history of insulin-dependent diabetes mellitus as well as COPD and she says she has "a heart problem" although says she has never had a heart attack before. 4 days ago her husband died and she's had difficulty sleeping ever since. She had no pain until her husband died. She is a heavy cigarette smoker. She sleeps on 3 pillows. She has lost weight. She has had no leg swelling. She has no history of DVT or pulmonary embolism.  Cardiac CT from 2014 at [facility]: SPECT: Impressions: - Low risk myocardial perfusion study - There is a small in size, subtle in severity, nearly completely  reversible defect involving the mid anterolateral and mid anterior  segments. This is consistent with possible artifact (misregistration  artifact) and cannot rule out low risk ischemia. - During stress: Global systolic function is hyperdynamic. The ejection  fraction was greater than 65%. - No significant coronary calcifications were noted on the attenuation CT.      Past Medical History:  Diagnosis Date  . Cancer ([place])   . COPD (chronic obstructive pulmonary disease) ([place])   . Diabetes mellitus without complication ([place])   . GERD (gastroesophageal reflux disease)   . HLD (hyperlipidemia)   . Pancreatitis   . Shingles     Patient  Active Problem List   Diagnosis Date Noted  . Pyelonephritis 12/28/2014  . Type 2 diabetes mellitus ([place]) 12/28/2014  . COPD (chronic obstructive pulmonary disease) ([place]) 12/28/2014  . GERD (gastroesophageal reflux disease) 12/28/2014  . History of shingles 12/28/2014  . HLD (hyperlipidemia) 12/28/2014    Past Surgical History:  Procedure Laterality Date  . ABDOMINAL HYSTERECTOMY     partial  . ECTOPIC PREGNANCY SURGERY Left   . LYMPHADENECTOMY    . SPLENECTOMY, PARTIAL    . vulvulasectomy      Prior to Admission medications   Medication Sig Start Date End Date Taking? Authorizing Provider  acyclovir (ZOVIRAX) 400 MG tablet Take 500 mg by mouth 2 (two) times daily.    [provider]  amoxicillin (AMOXIL) 500 MG tablet Take 1 tablet (500 mg total) by mouth 3 (three) times daily. 01/28/15   Beers, Pierce Crane, PA-C  aspirin 81 MG tablet Take 81 mg by mouth daily.    [provider]  ciprofloxacin (CIPRO) 500 MG tablet Take 1 tablet (500 mg total) by mouth 2 (two) times daily. 12/28/14   Fritzi Mandes, MD  esomeprazole (NEXIUM) 40 MG capsule Take 40 mg by mouth daily at 12 noon.    [provider]  HYDROcodone-acetaminophen (NORCO) 5-325 MG tablet Take 1 tablet by mouth every 6 (six) hours as needed for moderate pain. 05/02/16   Daymon Larsen, MD  insulin aspart (NOVOLOG) 100 UNIT/ML injection Inject 20 Units into the skin 2 (two) times daily at 10 am and 4 pm.    [provider]  insulin glargine (LANTUS) 100 UNIT/ML injection Inject 50 Units into the skin 2 (two) times daily.    [provider]  lidocaine (XYLOCAINE) 2 % solution Use as directed 20 mLs in the mouth or throat as needed for mouth pain. 01/28/15   Beers, Pierce Crane, PA-C  LORazepam (ATIVAN) 1 MG tablet Take 1 tablet (1 mg total) by mouth 2 (two) times daily as needed for anxiety. 01/30/17 01/30/18  Darel Hong, MD  ondansetron (ZOFRAN ODT) 4 MG disintegrating tablet Take 1 tablet (4  mg total) by mouth every 8 (eight) hours as needed for nausea or vomiting. 02/16/16   Joanne Gavel, MD  oxyCODONE-acetaminophen (ROXICET) 5-325 MG per tablet Take 1-2 tablets by mouth every 4 (four) hours as needed for severe pain. 01/28/15   Beers, Pierce Crane, PA-C  penicillin v potassium (VEETID) 500 MG tablet Take 1 tablet (500 mg total) by mouth 4 (four) times daily. 05/02/16   Daymon Larsen, MD  polyethylene glycol powder (GLYCOLAX/MIRALAX) powder Dissolve 1 tablespoon in 4-8 ounces of juice or water and consume 1-2 times daily until you're having a soft bowel movement every day. After that, reduce to just once daily. 02/16/16   Joanne Gavel, MD  traMADol (ULTRAM) 50 MG tablet Take 1 tablet (50 mg total) by mouth every 6 (six) hours as needed for severe pain (Do Not drive while taking this medication.). 02/16/16   Joanne Gavel, MD    Allergies Metformin and related; Darvon [propoxyphene]; Gabapentin; and Nsaids  Family History  Problem Relation Age of Onset  . Cancer Mother   . Osteoporosis Sister   . Heart disease Brother     Social History Social History  Substance Use Topics  . Smoking status: Current Every Day Smoker    Packs/day: 0.50    Types: Cigarettes  . Smokeless tobacco: Never Used  . Alcohol use No    Review of Systems Constitutional: No fever/chills Eyes: No visual changes. ENT: No sore throat. Cardiovascular: Positive chest pain. Respiratory: Denies shortness of breath. Gastrointestinal: No abdominal pain.  No nausea, no vomiting.  No diarrhea.  No constipation. Genitourinary: Negative for dysuria. Musculoskeletal: Negative for back pain. Skin: Negative for rash. Neurological: Negative for headaches, focal weakness or numbness.   ____________________________________________   PHYSICAL EXAM:  VITAL SIGNS: ED Triage Vitals [01/30/17 1423]  Enc Vitals Group     BP 134/81     Pulse Rate 95     Resp 18     Temp 98.3 F (36.8 C)     Temp Source  Oral     SpO2 95 %     Weight 129 lb (58.5 kg)     Height 5\' 2"  (1.575 m)     Head Circumference      Peak Flow      Pain Score 4     Pain Loc      Pain Edu?      Excl. in [place]?     Constitutional: Alert and oriented 4 tearful but in no acute distress Eyes: PERRL EOMI. Head: Atraumatic. Nose: No congestion/rhinnorhea. Mouth/Throat: No trismus Neck: No stridor.   Cardiovascular: Normal rate, regular rhythm. Grossly normal heart sounds.  Good peripheral circulation. Able to lie completely flat with no jugular venous distention Respiratory: Normal respiratory effort.  No retractions. Mild expiratory wheezes in bilateral fields Gastrointestinal: Soft nontender Musculoskeletal: No lower extremity edema  legs equal in size Neurologic:  Normal speech and language. No gross focal neurologic deficits  are appreciated. Skin:  Skin is warm, dry and intact. No rash noted. Psychiatric: Sad affect    ____________________________________________   DIFFERENTIAL includes but not limited to  Acute coronary syndrome, pulmonary embolism, pneumothorax, COPD exacerbation, Boerhaave syndrome ____________________________________________   LABS (all labs ordered are listed, but only abnormal results are displayed)  Labs Reviewed  BASIC METABOLIC PANEL - Abnormal; Notable for the following:       Result Value   CO2 16 (*)    Glucose, Bld 295 (*)    All other components within normal limits  HEPATIC FUNCTION PANEL - Abnormal; Notable for the following:    AST 12 (*)    ALT 7 (*)    Total Bilirubin 1.5 (*)    Indirect Bilirubin 1.4 (*)    All other components within normal limits  CK - Abnormal; Notable for the following:    Total CK 35 (*)    All other components within normal limits  GLUCOSE, CAPILLARY - Abnormal; Notable for the following:    Glucose-Capillary 312 (*)    All other components within normal limits  CBC  TROPONIN I  TSH  TROPONIN I    First troponin with no acute  disease __________________________________________  EKG  ED ECG REPORT I, Darel Hong, the attending physician, personally viewed and interpreted this ECG.  Date: 01/30/2017 Rate: 111 Rhythm: sinus tachycardia QRS Axis: normal Intervals: normal ST/T Wave abnormalities: normal Narrative Interpretation: poor R-wave progression very wavy baseline makes it difficult to interpret no overt signs of ischemia abnormal EKG  ____________________________________________  RADIOLOGY  Chest x-ray with no acute disease ____________________________________________   PROCEDURES  Procedure(s) performed: no  Procedures  Critical Care performed: no  Observation: no ____________________________________________   INITIAL IMPRESSION / ASSESSMENT AND PLAN / ED COURSE  Pertinent labs & imaging results that were available during my care of the patient were reviewed by me and considered in my medical decision making (see chart for details).  The patient has a heart score of 3 pending her second troponin. The patient is tearful and anxious appearing when discussing the death of her husband 4 days ago. I will give her 1 mg of Ativan orally now to help her rest.      ____----------------------------------------- 6:59 PM on 01/30/2017 ----------------------------------------- Fortunately the patient's second troponin is negative and she did not have a myocardial infarction. She does have a cardiologist at [facility] with who she has not seen them for years and she would like a referral to see a cardiologist here in [place] which I think is reasonable. At this point she is medically stable for outpatient management. ________________________________________   FINAL CLINICAL IMPRESSION(S) / ED DIAGNOSES  Final diagnoses:  Chest pain, unspecified type  Anxiety  Grief reaction      NEW MEDICATIONS STARTED DURING THIS VISIT:  New Prescriptions   LORAZEPAM (ATIVAN) 1 MG TABLET    Take 1  tablet (1 mg total) by mouth 2 (two) times daily as needed for anxiety.     Note:  This document was prepared using Dragon voice recognition software and may include unintentional dictation errors.     Darel Hong, MD 01/30/17 1901

## 2017-02-03 ENCOUNTER — Telehealth: Payer: Self-pay

## 2017-02-03 NOTE — Telephone Encounter (Signed)
Lmov for patient to call back They need to schedule ED FU seen on 01/30/17 for CP  Will try again at a later time

## 2017-02-06 ENCOUNTER — Emergency Department: Payer: Self-pay

## 2017-02-06 ENCOUNTER — Emergency Department
Admission: EM | Admit: 2017-02-06 | Discharge: 2017-02-06 | Disposition: A | Discharge status: Home / Self Care | Payer: Self-pay | Attending: Emergency Medicine | Admitting: Emergency Medicine

## 2017-02-06 DIAGNOSIS — F410 Panic disorder [episodic paroxysmal anxiety] without agoraphobia: Secondary | ICD-10-CM | POA: Insufficient documentation

## 2017-02-06 DIAGNOSIS — Z794 Long term (current) use of insulin: Secondary | ICD-10-CM | POA: Insufficient documentation

## 2017-02-06 DIAGNOSIS — E119 Type 2 diabetes mellitus without complications: Secondary | ICD-10-CM | POA: Insufficient documentation

## 2017-02-06 DIAGNOSIS — Z859 Personal history of malignant neoplasm, unspecified: Secondary | ICD-10-CM | POA: Insufficient documentation

## 2017-02-06 DIAGNOSIS — Z79899 Other long term (current) drug therapy: Secondary | ICD-10-CM | POA: Insufficient documentation

## 2017-02-06 DIAGNOSIS — R0789 Other chest pain: Secondary | ICD-10-CM | POA: Insufficient documentation

## 2017-02-06 DIAGNOSIS — J449 Chronic obstructive pulmonary disease, unspecified: Secondary | ICD-10-CM | POA: Insufficient documentation

## 2017-02-06 DIAGNOSIS — F17210 Nicotine dependence, cigarettes, uncomplicated: Secondary | ICD-10-CM | POA: Insufficient documentation

## 2017-02-06 DIAGNOSIS — Z7982 Long term (current) use of aspirin: Secondary | ICD-10-CM | POA: Insufficient documentation

## 2017-02-06 LAB — COMPREHENSIVE METABOLIC PANEL
ALK PHOS: 65 U/L (ref 38–126)
ALT: 10 U/L — ABNORMAL LOW (ref 14–54)
ANION GAP: 13 (ref 5–15)
AST: 14 U/L — ABNORMAL LOW (ref 15–41)
Albumin: 3.5 g/dL (ref 3.5–5.0)
BUN: 9 mg/dL (ref 6–20)
CALCIUM: 8.8 mg/dL — AB (ref 8.9–10.3)
CHLORIDE: 100 mmol/L — AB (ref 101–111)
CO2: 22 mmol/L (ref 22–32)
Creatinine, Ser: 0.69 mg/dL (ref 0.44–1.00)
GFR calc non Af Amer: >60 mL/min (ref >60)
Glucose, Bld: 361 mg/dL — ABNORMAL HIGH (ref 65–99)
Potassium: 4 mmol/L (ref 3.5–5.1)
SODIUM: 135 mmol/L (ref 135–145)
Total Bilirubin: 1.3 mg/dL — ABNORMAL HIGH (ref 0.3–1.2)
Total Protein: 7.4 g/dL (ref 6.5–8.1)

## 2017-02-06 LAB — CBC
HCT: 47.9 % — ABNORMAL HIGH (ref 35.0–47.0)
Hemoglobin: 16.4 g/dL — ABNORMAL HIGH (ref 12.0–16.0)
MCH: 31.8 pg (ref 26.0–34.0)
MCHC: 34.3 g/dL (ref 32.0–36.0)
MCV: 92.6 fL (ref 80.0–100.0)
PLATELETS: 222 10*3/uL (ref 150–440)
RBC: 5.17 MIL/uL (ref 3.80–5.20)
RDW: 14.1 % (ref 11.5–14.5)
WBC: 9.2 10*3/uL (ref 3.6–11.0)

## 2017-02-06 LAB — GLUCOSE, CAPILLARY: GLUCOSE-CAPILLARY: 336 mg/dL — AB (ref 65–99)

## 2017-02-06 LAB — TROPONIN I

## 2017-02-06 MED ORDER — SODIUM CHLORIDE 0.9 % IV BOLUS (SEPSIS): 1000.0000 mL | Freq: Once | INTRAVENOUS | Status: DC

## 2017-02-06 MED ORDER — LORAZEPAM 2 MG/ML IJ SOLN
0.5000 mg | Freq: Once | INTRAMUSCULAR | Status: AC
  Administered 2017-02-06: 0.5 mg via INTRAVENOUS
  Filled 2017-02-06: qty 1

## 2017-02-06 MED ORDER — LORAZEPAM 0.5 MG PO TABS: 0.5000 mg | ORAL_TABLET | Freq: Three times a day (TID) | ORAL | 0 refills | Status: DC | PRN

## 2017-02-06 NOTE — ED Triage Notes (Signed)
Pt brought in by [facility] from home.  Pt c/o for breathing difficulty, but upon arrival EMS states that pt c/o chest pain and being off of her meds and being under stress since husband passed away.  Pt BS by EMS found to be 308.  EMS started IV and NS bolus in route.  Pt states she took one of her home NTG sublingual. Pt A&Ox4.

## 2017-02-06 NOTE — ED Provider Notes (Signed)
[facility] Emergency Department Provider Note ____________________________________________   I have reviewed the triage vital signs and the triage nursing note.  HISTORY  Chief Complaint Hyperglycemia and Chest Pain   Historian Patient  HPI Renee Mack is a 51 y.o. female with a history of COPD and GERD, as well as significant social structure, spouse died in the last few weeks, presents due to chest tightness and heart racing this morning that lasted for about an hour. Patient states that she has not been able to sleep and has not been able to eat over the past few weeks.  She was seen in the emergency department about a week ago for similar symptoms.  She has not followed up with cardiology.  Patient states that she feels like she is extremely anxious and that she might be having panic attacks. She does not have a history of psychiatric illness, depression or anxiety.  Currently she is feeling anxious, but without any chest discomfort.    Past Medical History:  Diagnosis Date  . Cancer ([place])   . COPD (chronic obstructive pulmonary disease) ([place])   . Diabetes mellitus without complication ([place])   . GERD (gastroesophageal reflux disease)   . HLD (hyperlipidemia)   . Pancreatitis   . Shingles     Patient Active Problem List   Diagnosis Date Noted  . Pyelonephritis 12/28/2014  . Type 2 diabetes mellitus ([place]) 12/28/2014  . COPD (chronic obstructive pulmonary disease) ([place]) 12/28/2014  . GERD (gastroesophageal reflux disease) 12/28/2014  . History of shingles 12/28/2014  . HLD (hyperlipidemia) 12/28/2014    Past Surgical History:  Procedure Laterality Date  . ABDOMINAL HYSTERECTOMY     partial  . ECTOPIC PREGNANCY SURGERY Left   . LYMPHADENECTOMY    . SPLENECTOMY, PARTIAL    . vulvulasectomy      Prior to Admission medications   Medication Sig Start Date End Date Taking? Authorizing Provider  aspirin 81 MG tablet Take 81 mg by mouth  daily.   Yes [provider]  esomeprazole (NEXIUM) 40 MG capsule Take 40 mg by mouth daily at 12 noon.   Yes [provider]  insulin aspart (NOVOLOG) 100 UNIT/ML injection Inject 20 Units into the skin 2 (two) times daily at 10 am and 4 pm.   Yes [provider]  insulin glargine (LANTUS) 100 UNIT/ML injection Inject 50 Units into the skin 2 (two) times daily.   Yes [provider]  ondansetron (ZOFRAN ODT) 4 MG disintegrating tablet Take 1 tablet (4 mg total) by mouth every 8 (eight) hours as needed for nausea or vomiting. 02/16/16  Yes Joanne Gavel, MD  oxyCODONE-acetaminophen (ROXICET) 5-325 MG per tablet Take 1-2 tablets by mouth every 4 (four) hours as needed for severe pain. 01/28/15  Yes Beers, Pierce Crane, PA-C  HYDROcodone-acetaminophen (NORCO) 5-325 MG tablet Take 1 tablet by mouth every 6 (six) hours as needed for moderate pain. Patient not taking: Reported on 02/06/2017 05/02/16   Daymon Larsen, MD  lidocaine (XYLOCAINE) 2 % solution Use as directed 20 mLs in the mouth or throat as needed for mouth pain. Patient not taking: Reported on 02/06/2017 01/28/15   Beers, Pierce Crane, PA-C  LORazepam (ATIVAN) 0.5 MG tablet Take 1 tablet (0.5 mg total) by mouth every 8 (eight) hours as needed for anxiety. 02/06/17 02/06/18  Lisa Roca, MD  polyethylene glycol powder (GLYCOLAX/MIRALAX) powder Dissolve 1 tablespoon in 4-8 ounces of juice or water and consume 1-2 times daily until you're  having a soft bowel movement every day. After that, reduce to just once daily. Patient not taking: Reported on 02/06/2017 02/16/16   Joanne Gavel, MD  traMADol (ULTRAM) 50 MG tablet Take 1 tablet (50 mg total) by mouth every 6 (six) hours as needed for severe pain (Do Not drive while taking this medication.). Patient not taking: Reported on 02/06/2017 02/16/16   Joanne Gavel, MD    Allergies  Allergen Reactions  . Metformin And Related Shortness Of Breath  . Darvon [Propoxyphene]  Itching  . Gabapentin Swelling  . Nsaids Other (See Comments)    Ulcers     Family History  Problem Relation Age of Onset  . Cancer Mother   . Osteoporosis Sister   . Heart disease Brother     Social History Social History  Substance Use Topics  . Smoking status: Current Every Day Smoker    Packs/day: 1.00    Types: Cigarettes  . Smokeless tobacco: Never Used  . Alcohol use No    Review of Systems  Constitutional: Negative for fever. Eyes: Negative for visual changes. ENT: Negative for sore throat. Cardiovascular: Negative for chest pain, but chest pressure associated with heart racing. Respiratory: Negative for cough. Positive for shortness of breath when her heart was racing. Gastrointestinal: Negative for abdominal pain, vomiting and diarrhea. Genitourinary: Negative for dysuria. Musculoskeletal: Negative for back pain. Skin: Negative for rash. Neurological: Negative for headache.  ____________________________________________   PHYSICAL EXAM:  VITAL SIGNS: ED Triage Vitals  Enc Vitals Group     BP --      Pulse --      Resp --      Temp 02/06/17 1203 98.2 F (36.8 C)     Temp Source 02/06/17 1203 Oral     SpO2 --      Weight 02/06/17 1204 129 lb (58.5 kg)     Height 02/06/17 1204 5\' 2"  (1.575 m)     Head Circumference --      Peak Flow --      Pain Score 02/06/17 1202 5     Pain Loc --      Pain Edu? --      Excl. in [place]? --      Constitutional: Alert and oriented.  Well appearing and in no distress. HEENT   Head: Normocephalic and atraumatic.      Eyes: Conjunctivae are normal. Pupils equal and round.       Ears:         Nose: No congestion/rhinnorhea.   Mouth/Throat: Mucous membranes are moist.   Neck: No stridor. Cardiovascular/Chest: Normal rate, regular rhythm.  No murmurs, rubs, or gallops. Respiratory: Normal respiratory effort without tachypnea nor retractions. Breath sounds are clear and equal bilaterally. No  wheezes/rales/rhonchi. Gastrointestinal: Soft. No distention, no guarding, no rebound. Nontender.  Genitourinary/rectal: Deferred Musculoskeletal: Nontender with normal range of motion in all extremities. No joint effusions.  No lower extremity tenderness.  No edema. Neurologic:  Normal speech and language. No gross or focal neurologic deficits are appreciated. Skin:  Skin is warm, dry and intact. No rash noted. Psychiatric: Mood and affect are normal. Speech and behavior are normal. Patient exhibits appropriate insight and judgment.   ____________________________________________  LABS (pertinent positives/negatives)  Labs Reviewed  CBC - Abnormal; Notable for the following:       Result Value   Hemoglobin 16.4 (*)    HCT 47.9 (*)    All other components within normal limits  GLUCOSE, CAPILLARY -  Abnormal; Notable for the following:    Glucose-Capillary 336 (*)    All other components within normal limits  COMPREHENSIVE METABOLIC PANEL - Abnormal; Notable for the following:    Chloride 100 (*)    Glucose, Bld 361 (*)    Calcium 8.8 (*)    AST 14 (*)    ALT 10 (*)    Total Bilirubin 1.3 (*)    All other components within normal limits  TROPONIN I    ____________________________________________    EKG I, Lisa Roca, MD, the attending physician have personally viewed and interpreted all ECGs.  77 bpm normal sinus rhythm. Narrow QRS. Normal axis. Normal ST and T-wave. Q waves anteriorly. ____________________________________________  RADIOLOGY All Xrays were viewed by me. Imaging interpreted by Radiologist.  Chest x-ray two-view:  IMPRESSION: No active cardiopulmonary disease. __________________________________________  PROCEDURES  Procedure(s) performed: None  Critical Care performed: None  ____________________________________________   ED COURSE / ASSESSMENT AND PLAN  Pertinent labs & imaging results that were available during my care of the patient were  reviewed by me and considered in my medical decision making (see chart for details).    Is here with what sounds like a panic attack, patient reports being overwhelmed and feeling of severe stress over the past couple weeks since her spouse died.  She does not have a history of underlying psychiatric condition or prior anxiety.  She did have some palpitations and chest pressure, this is about 4 hours ago and EKG is overall reassuring as is negative troponin.  She was evaluated for a similar episode a couple days ago almost a week ago in the emergency department and was asked to follow-up with her cardiologist. Everette Rank also give her a cardiologist here in [place].    CONSULTATIONS:   [place], TTS gave patient resources for outpatient  Patient / Family / Caregiver informed of clinical course, medical decision-making process, and agree with plan.   I discussed return precautions, follow-up instructions, and discharge instructions with patient and/or family.  Discharge Instructions : You were evaluated for chest pressure and heart racing, and as we discussed your exam and evaluation are overall reassuring in the emergency department today. I am most suspicious of panic attack as a result of grease and stress. Please get with the hospitalist bereavement group. Please also follow up with a primary care doctor as well as cardiologist. Please also follow up with a psychiatrist and/or therapist.  Return to the emergency room immediately for any worsening symptoms including trouble breathing, fever, vomiting blood, weakness, numbness, passing out, or any other symptoms concerning to you.  ___________________________________________   FINAL CLINICAL IMPRESSION(S) / ED DIAGNOSES   Final diagnoses:  Panic attack  Atypical chest pain              Note: This dictation was prepared with Dragon dictation. Any transcriptional errors that result from this process are unintentional     Lisa Roca, MD 02/06/17 [phone]

## 2017-02-06 NOTE — Discharge Instructions (Signed)
You were evaluated for chest pressure and heart racing, and as we discussed your exam and evaluation are overall reassuring in the emergency department today. I am most suspicious of panic attack as a result of grease and stress. Please get with the hospitalist bereavement group. Please also follow up with a primary care doctor as well as cardiologist. Please also follow up with a psychiatrist and/or therapist.  Return to the emergency room immediately for any worsening symptoms including trouble breathing, fever, vomiting blood, weakness, numbness, passing out, or any other symptoms concerning to you.

## 2017-02-06 NOTE — ED Notes (Signed)
Chaplain came back by room to finish prayer/conversation with pt since she had to leave early so pt could go to procedure. Chaplain said if she is needed again to please call her and she will come back. Lm edt

## 2017-02-06 NOTE — BH Assessment (Signed)
Per request of ER MD (Dr. Reita Cliche), writer provided the pt. with information and instructions on how to access [facility] ([facility]).  Patient denies SI/HI and AV/H.  [facility] [address]  [place], [place] [address] [phone]

## 2017-02-06 NOTE — ED Notes (Signed)
Pt tender to palpation on L side of chest.  Pt having dry hacking cough at this time as well.

## 2017-02-06 NOTE — Progress Notes (Signed)
RN requested prayer for Renee Mack prior to procedure. Prayer offered. Renee Mack continues with acute grief. Chaplain available for future [place] visits upon request.

## 2017-03-01 ENCOUNTER — Emergency Department
Admission: EM | Admit: 2017-03-01 | Discharge: 2017-03-01 | Disposition: A | Discharge status: Home / Self Care | Payer: Self-pay | Attending: Emergency Medicine | Admitting: Emergency Medicine

## 2017-03-01 DIAGNOSIS — F17210 Nicotine dependence, cigarettes, uncomplicated: Secondary | ICD-10-CM | POA: Insufficient documentation

## 2017-03-01 DIAGNOSIS — K0889 Other specified disorders of teeth and supporting structures: Secondary | ICD-10-CM | POA: Insufficient documentation

## 2017-03-01 DIAGNOSIS — Z7982 Long term (current) use of aspirin: Secondary | ICD-10-CM | POA: Insufficient documentation

## 2017-03-01 DIAGNOSIS — Z79899 Other long term (current) drug therapy: Secondary | ICD-10-CM | POA: Insufficient documentation

## 2017-03-01 DIAGNOSIS — Z859 Personal history of malignant neoplasm, unspecified: Secondary | ICD-10-CM | POA: Insufficient documentation

## 2017-03-01 DIAGNOSIS — E1165 Type 2 diabetes mellitus with hyperglycemia: Secondary | ICD-10-CM | POA: Insufficient documentation

## 2017-03-01 LAB — GLUCOSE, CAPILLARY
GLUCOSE-CAPILLARY: 357 mg/dL — AB (ref 65–99)
Glucose-Capillary: 324 mg/dL — ABNORMAL HIGH (ref 65–99)

## 2017-03-01 MED ORDER — TRAMADOL HCL 50 MG PO TABS
50.0000 mg | ORAL_TABLET | Freq: Once | ORAL | Status: AC
  Administered 2017-03-01: 50 mg via ORAL
  Filled 2017-03-01: qty 1

## 2017-03-01 MED ORDER — TRAMADOL HCL 50 MG PO TABS: 50.0000 mg | ORAL_TABLET | Freq: Four times a day (QID) | ORAL | 0 refills | Status: DC | PRN

## 2017-03-01 MED ORDER — AMOXICILLIN 875 MG PO TABS: 875.0000 mg | ORAL_TABLET | Freq: Two times a day (BID) | ORAL | 0 refills | Status: DC

## 2017-03-01 MED ORDER — AMOXICILLIN 500 MG PO CAPS
1000.0000 mg | ORAL_CAPSULE | Freq: Once | ORAL | Status: AC
  Administered 2017-03-01: 1000 mg via ORAL
  Filled 2017-03-01: qty 2

## 2017-03-01 NOTE — ED Triage Notes (Signed)
Pt presents to ED with c/o hyperglycemia and dental pain. Pt reports CGB at home was 419, pt reports taking 25 units of Novolog PTA. Pt also reports lower left sided dental pain.

## 2017-03-01 NOTE — ED Provider Notes (Signed)
[facility] Provider Note   CSN: [id number] Arrival date & time: 03/01/17  2013     History   Chief Complaint Chief Complaint  Patient presents with  . Dental Pain  . Hyperglycemia    HPI Renee Mack is a 51 y.o. female presents today for evaluation of dental pain. Patient has had dental pain present for 1 week. She denies any fevers, drainage or facial swelling. Pain is in her lower right central incisor. She has significant dental decay and is planning to have this tooth pulled what she says of money. Patient has been taking Tylenol with no improvement. Pain is moderate. She is able to swallow and tolerate by mouth. She denies any chest pain, shortness of breath, fevers. No nausea vomiting abdominal pain or diarrhea. Patient states she has checked her blood sugars at home, noticed that they have been elevated around 360. She has been taking her insulin as prescribed and PCP is aware of her elevated blood sugars. Patient has an appointment to adjust her diabetic medications this week.      HPI  Past Medical History:  Diagnosis Date  . Cancer ([place])   . COPD (chronic obstructive pulmonary disease) ([place])   . Diabetes mellitus without complication ([place])   . GERD (gastroesophageal reflux disease)   . HLD (hyperlipidemia)   . Pancreatitis   . Shingles     Patient Active Problem List   Diagnosis Date Noted  . Pyelonephritis 12/28/2014  . Type 2 diabetes mellitus ([place]) 12/28/2014  . COPD (chronic obstructive pulmonary disease) ([place]) 12/28/2014  . GERD (gastroesophageal reflux disease) 12/28/2014  . History of shingles 12/28/2014  . HLD (hyperlipidemia) 12/28/2014    Past Surgical History:  Procedure Laterality Date  . ABDOMINAL HYSTERECTOMY     partial  . ECTOPIC PREGNANCY SURGERY Left   . LYMPHADENECTOMY    . SPLENECTOMY, PARTIAL    . vulvulasectomy      OB History    No data available       Home Medications    Prior to Admission medications    Medication Sig Start Date End Date Taking? Authorizing Provider  amoxicillin (AMOXIL) 875 MG tablet Take 1 tablet (875 mg total) by mouth 2 (two) times daily. X 10 days 03/01/17   Duanne Guess, PA-C  aspirin 81 MG tablet Take 81 mg by mouth daily.    [provider]  esomeprazole (NEXIUM) 40 MG capsule Take 40 mg by mouth daily at 12 noon.    [provider]  HYDROcodone-acetaminophen (NORCO) 5-325 MG tablet Take 1 tablet by mouth every 6 (six) hours as needed for moderate pain. Patient not taking: Reported on 02/06/2017 05/02/16   Daymon Larsen, MD  insulin aspart (NOVOLOG) 100 UNIT/ML injection Inject 20 Units into the skin 2 (two) times daily at 10 am and 4 pm.    [provider]  insulin glargine (LANTUS) 100 UNIT/ML injection Inject 50 Units into the skin 2 (two) times daily.    [provider]  lidocaine (XYLOCAINE) 2 % solution Use as directed 20 mLs in the mouth or throat as needed for mouth pain. Patient not taking: Reported on 02/06/2017 01/28/15   Beers, Pierce Crane, PA-C  LORazepam (ATIVAN) 0.5 MG tablet Take 1 tablet (0.5 mg total) by mouth every 8 (eight) hours as needed for anxiety. 02/06/17 02/06/18  Lisa Roca, MD  ondansetron (ZOFRAN ODT) 4 MG disintegrating tablet Take 1 tablet (4 mg total) by mouth every 8 (eight) hours as  needed for nausea or vomiting. 02/16/16   Joanne Gavel, MD  oxyCODONE-acetaminophen (ROXICET) 5-325 MG per tablet Take 1-2 tablets by mouth every 4 (four) hours as needed for severe pain. 01/28/15   Beers, Pierce Crane, PA-C  polyethylene glycol powder (GLYCOLAX/MIRALAX) powder Dissolve 1 tablespoon in 4-8 ounces of juice or water and consume 1-2 times daily until you're having a soft bowel movement every day. After that, reduce to just once daily. Patient not taking: Reported on 02/06/2017 02/16/16   Joanne Gavel, MD  traMADol (ULTRAM) 50 MG tablet Take 1 tablet (50 mg total) by mouth every 6 (six) hours as needed. 03/01/17    Duanne Guess, PA-C    Family History Family History  Problem Relation Age of Onset  . Cancer Mother   . Osteoporosis Sister   . Heart disease Brother     Social History Social History  Substance Use Topics  . Smoking status: Current Every Day Smoker    Packs/day: 1.00    Types: Cigarettes  . Smokeless tobacco: Never Used  . Alcohol use No     Allergies   Metformin and related; Darvon [propoxyphene]; Gabapentin; and Nsaids   Review of Systems Review of Systems  Constitutional: Negative for fever.  HENT: Positive for dental problem. Negative for sore throat and voice change.   Eyes: Negative for visual disturbance.  Respiratory: Negative for chest tightness and shortness of breath.   Cardiovascular: Negative for chest pain.  Gastrointestinal: Negative for abdominal pain, diarrhea, nausea and vomiting.  Genitourinary: Negative for difficulty urinating, dysuria and urgency.  Musculoskeletal: Negative for back pain and myalgias.  Skin: Negative for rash.  Neurological: Negative for dizziness, weakness, light-headedness and headaches.     Physical Exam Updated Vital Signs BP 97/60 (BP Location: Left Arm)   Pulse 77   Temp 98.9 F (37.2 C) (Oral)   Resp 17   Ht 5\' 1"  (1.549 m)   Wt 59 kg (130 lb)   SpO2 97%   BMI 24.56 kg/m   Physical Exam  Constitutional: She is oriented to person, place, and time. She appears well-developed and well-nourished.  HENT:  Head: Normocephalic and atraumatic.  Mouth/Throat: Oropharynx is clear and moist. No oral lesions. No trismus in the jaw. No dental abscesses or uvula swelling. No oropharyngeal exudate or tonsillar abscesses.    Eyes: Conjunctivae are normal.  Neck: Normal range of motion.  Cardiovascular: Normal rate.   Pulmonary/Chest: Effort normal. No respiratory distress.  Abdominal: Soft. She exhibits no distension. There is no tenderness.  Musculoskeletal: Normal range of motion.  Neurological: She is alert and  oriented to person, place, and time.  Skin: Skin is warm. No rash noted.  Psychiatric: She has a normal mood and affect. Her behavior is normal. Thought content normal.     ED Treatments / Results  Labs (all labs ordered are listed, but only abnormal results are displayed) Labs Reviewed  GLUCOSE, CAPILLARY - Abnormal; Notable for the following:       Result Value   Glucose-Capillary 324 (*)    All other components within normal limits  GLUCOSE, CAPILLARY - Abnormal; Notable for the following:    Glucose-Capillary 357 (*)    All other components within normal limits  BASIC METABOLIC PANEL  CBC  URINALYSIS, COMPLETE (UACMP) WITH MICROSCOPIC  CBG MONITORING, ED    EKG  EKG Interpretation None       Radiology No results found.  Procedures Procedures (including critical care time)  Medications  Ordered in ED Medications  amoxicillin (AMOXIL) capsule 1,000 mg (not administered)  traMADol (ULTRAM) tablet 50 mg (not administered)     Initial Impression / Assessment and Plan / ED Course  I have reviewed the triage vital signs and the nursing notes.  Pertinent labs & imaging results that were available during my care of the patient were reviewed by me and considered in my medical decision making (see chart for details).     51 year old female with dental pain, likely developing abscess. She has multiple dental caries. She is started on oral antibiotics and tramadol. She will increase fluids. She has appointment to see PCP to discuss adjusting her diabetic medications. She is educated on signs and symptoms return to the ED for.  Final Clinical Impressions(s) / ED Diagnoses   Final diagnoses:  Pain, dental    New Prescriptions New Prescriptions   AMOXICILLIN (AMOXIL) 875 MG TABLET    Take 1 tablet (875 mg total) by mouth 2 (two) times daily. X 10 days   TRAMADOL (ULTRAM) 50 MG TABLET    Take 1 tablet (50 mg total) by mouth every 6 (six) hours as needed.     Renata Caprice 03/01/17 2106    Eula Listen, MD 03/01/17 2136

## 2017-03-01 NOTE — Discharge Instructions (Signed)
Please take antibiotics and pain medication as prescribed. Continue with her insulin and continue to monitor her sugars. Return to the ER for any worsening symptoms or changes in her health. Please schedule follow-up appointment with dental clinic.

## 2017-03-21 ENCOUNTER — Encounter: Payer: Self-pay | Admitting: Emergency Medicine

## 2017-03-21 ENCOUNTER — Emergency Department
Admission: EM | Admit: 2017-03-21 | Discharge: 2017-03-21 | Disposition: A | Discharge status: Home / Self Care | Payer: Self-pay | Attending: Emergency Medicine | Admitting: Emergency Medicine

## 2017-03-21 DIAGNOSIS — F17210 Nicotine dependence, cigarettes, uncomplicated: Secondary | ICD-10-CM | POA: Insufficient documentation

## 2017-03-21 DIAGNOSIS — Z79899 Other long term (current) drug therapy: Secondary | ICD-10-CM | POA: Insufficient documentation

## 2017-03-21 DIAGNOSIS — R102 Pelvic and perineal pain: Secondary | ICD-10-CM | POA: Insufficient documentation

## 2017-03-21 DIAGNOSIS — Z794 Long term (current) use of insulin: Secondary | ICD-10-CM | POA: Insufficient documentation

## 2017-03-21 DIAGNOSIS — J449 Chronic obstructive pulmonary disease, unspecified: Secondary | ICD-10-CM | POA: Insufficient documentation

## 2017-03-21 DIAGNOSIS — E119 Type 2 diabetes mellitus without complications: Secondary | ICD-10-CM | POA: Insufficient documentation

## 2017-03-21 DIAGNOSIS — Z7982 Long term (current) use of aspirin: Secondary | ICD-10-CM | POA: Insufficient documentation

## 2017-03-21 MED ORDER — OXYCODONE-ACETAMINOPHEN 7.5-325 MG PO TABS: 1.0000 | ORAL_TABLET | ORAL | 0 refills | Status: AC | PRN

## 2017-03-21 NOTE — ED Triage Notes (Addendum)
Patient has history of vulvar cancer (2 surgeries).  Patient c/o pain to vulva x 2 weeks.  Patient states symptoms are similar to when she had cancerous leisions.  Patient is normally seen through [facility] clinic.  Patient has called clinic for an appointment and they are working to get her in for an appointment.  Here today for help with pain relief.

## 2017-03-21 NOTE — ED Provider Notes (Signed)
[facility] Emergency Department Provider Note  ____________________________________________  Time seen: Approximately 6:10 PM  I have reviewed the triage vital signs and the nursing notes.   HISTORY  Chief Complaint Vaginal Pain    HPI Renee Mack is a 51 y.o. female presenting to the emergency department with an exacerbation of vaginal pain. Patient reports that she has been previously diagnosed with vulvar carcinoma and has underwent several surgeries. Patient reports that she knows that her "cancer his back". Patient has recently experienced significant weight loss and has had night sweats. Patient is in the process of trying to obtain an appointment with her Dr. [name], her oncologist. Patient reports that Dr. [name] anticipates trying to get her in the office this week. Patient reports that her oncologist will conduct blood work. Patient reports to the emergency department for pain management. She denies fever. Patient denies abdominal pain. She is tolerating fluids and food by mouth. Patient restricts pain to the labia majora and rectum. No diarrhea, emesis or bowel or bladder incontinence.   Past Medical History:  Diagnosis Date  . Cancer ([place])   . COPD (chronic obstructive pulmonary disease) ([place])   . Diabetes mellitus without complication ([place])   . GERD (gastroesophageal reflux disease)   . HLD (hyperlipidemia)   . Pancreatitis   . Shingles     Patient Active Problem List   Diagnosis Date Noted  . Pyelonephritis 12/28/2014  . Type 2 diabetes mellitus ([place]) 12/28/2014  . COPD (chronic obstructive pulmonary disease) ([place]) 12/28/2014  . GERD (gastroesophageal reflux disease) 12/28/2014  . History of shingles 12/28/2014  . HLD (hyperlipidemia) 12/28/2014    Past Surgical History:  Procedure Laterality Date  . ABDOMINAL HYSTERECTOMY     partial  . ECTOPIC PREGNANCY SURGERY Left   . LYMPHADENECTOMY    . SPLENECTOMY, PARTIAL    .  vulvulasectomy      Prior to Admission medications   Medication Sig Start Date End Date Taking? Authorizing Provider  amoxicillin (AMOXIL) 875 MG tablet Take 1 tablet (875 mg total) by mouth 2 (two) times daily. X 10 days 03/01/17   Duanne Guess, PA-C  aspirin 81 MG tablet Take 81 mg by mouth daily.    [provider]  esomeprazole (NEXIUM) 40 MG capsule Take 40 mg by mouth daily at 12 noon.    [provider]  HYDROcodone-acetaminophen (NORCO) 5-325 MG tablet Take 1 tablet by mouth every 6 (six) hours as needed for moderate pain. Patient not taking: Reported on 02/06/2017 05/02/16   Daymon Larsen, MD  insulin aspart (NOVOLOG) 100 UNIT/ML injection Inject 20 Units into the skin 2 (two) times daily at 10 am and 4 pm.    [provider]  insulin glargine (LANTUS) 100 UNIT/ML injection Inject 50 Units into the skin 2 (two) times daily.    [provider]  lidocaine (XYLOCAINE) 2 % solution Use as directed 20 mLs in the mouth or throat as needed for mouth pain. Patient not taking: Reported on 02/06/2017 01/28/15   Beers, Pierce Crane, PA-C  LORazepam (ATIVAN) 0.5 MG tablet Take 1 tablet (0.5 mg total) by mouth every 8 (eight) hours as needed for anxiety. 02/06/17 02/06/18  Lisa Roca, MD  ondansetron (ZOFRAN ODT) 4 MG disintegrating tablet Take 1 tablet (4 mg total) by mouth every 8 (eight) hours as needed for nausea or vomiting. 02/16/16   Joanne Gavel, MD  oxyCODONE-acetaminophen (PERCOCET) 7.5-325 MG tablet Take 1 tablet by mouth  every 4 (four) hours as needed for severe pain. 03/21/17 03/26/17  Lannie Fields, PA-C  polyethylene glycol powder (GLYCOLAX/MIRALAX) powder Dissolve 1 tablespoon in 4-8 ounces of juice or water and consume 1-2 times daily until you're having a soft bowel movement every day. After that, reduce to just once daily. Patient not taking: Reported on 02/06/2017 02/16/16   Joanne Gavel, MD  traMADol (ULTRAM) 50 MG tablet Take 1 tablet (50 mg  total) by mouth every 6 (six) hours as needed. 03/01/17   Duanne Guess, PA-C    Allergies Metformin and related; Darvon [propoxyphene]; Gabapentin; and Nsaids  Family History  Problem Relation Age of Onset  . Cancer Mother   . Osteoporosis Sister   . Heart disease Brother     Social History Social History  Substance Use Topics  . Smoking status: Current Every Day Smoker    Packs/day: 1.00    Types: Cigarettes  . Smokeless tobacco: Never Used  . Alcohol use No     Review of Systems  Constitutional: patient has had night sweats and weight loss.  Eyes: No visual changes. No discharge ENT: No upper respiratory complaints. Cardiovascular: no chest pain. Respiratory: no cough. No SOB. Gastrointestinal: No abdominal pain.  No nausea, no vomiting.  No diarrhea.  No constipation. Genitourinary: Patient has pain of the labia majora and rectal pain.  Musculoskeletal: Negative for musculoskeletal pain. Skin: Negative for rash, abrasions, lacerations, ecchymosis. Neurological: Negative for headaches, focal weakness or numbness.   ____________________________________________   PHYSICAL EXAM:  VITAL SIGNS: ED Triage Vitals  Enc Vitals Group     BP 03/21/17 1745 123/77     Pulse Rate 03/21/17 1744 100     Resp 03/21/17 1744 16     Temp 03/21/17 1744 98.7 F (37.1 C)     Temp src --      SpO2 03/21/17 1744 97 %     Weight 03/21/17 1747 130 lb (59 kg)     Height 03/21/17 1747 5\' 1"  (1.549 m)     Head Circumference --      Peak Flow --      Pain Score 03/21/17 1747 9     Pain Loc --      Pain Edu? --      Excl. in [place]? --      Constitutional: Alert and oriented. Well appearing and in no acute distress. Eyes: Conjunctivae are normal. PERRL. EOMI. Head: Atraumatic. Cardiovascular: Normal rate, regular rhythm. Normal S1 and S2.  Good peripheral circulation. Respiratory: Normal respiratory effort without tachypnea or retractions. Lungs CTAB. Good air entry to the bases  with no decreased or absent breath sounds. Gastrointestinal: Bowel sounds 4 quadrants. Soft and nontender to palpation. No guarding or rigidity. No palpable masses. No distention. No CVA tenderness. Genitourinary: Labia majora is erythematous with flat, macular, erythematous lesions. Patient also has erythema around the rectum with papular lesions. Musculoskeletal: Full range of motion to all extremities. No gross deformities appreciated. Neurologic:  Normal speech and language. No gross focal neurologic deficits are appreciated.  Psychiatric: Mood and affect are normal. Speech and behavior are normal. Patient exhibits appropriate insight and judgement.   ____________________________________________   LABS (all labs ordered are listed, but only abnormal results are displayed)  Labs Reviewed - No data to display ____________________________________________  EKG   ____________________________________________  RADIOLOGY  No results found.  ____________________________________________    PROCEDURES  Procedure(s) performed:    Procedures    Medications - No data  to display   ____________________________________________   INITIAL IMPRESSION / ASSESSMENT AND PLAN / ED COURSE  Pertinent labs & imaging results that were available during my care of the patient were reviewed by me and considered in my medical decision making (see chart for details).  Review of the [place] CSRS was performed in accordance of the [place] prior to dispensing any controlled drugs.     Assessment and plan Vulvar Carcinoma Patient presents to the emergency department with an exacerbation of vaginal pain secondary to carcinoma. Patient is in the process of obtaining an appointment with her oncologist later this week. Oncologist is anticipating conducting blood work. Patient declined workup in the emergency department and is here for pain management. Patient was discharged with a 5 day course of  Percocet. Patient was advised that her pain medication would need to last her until she could see oncologist. Vital signs remained reassuring throughout emergency department encounter. All patient questions were answered.   ____________________________________________  FINAL CLINICAL IMPRESSION(S) / ED DIAGNOSES  Final diagnoses:  Vaginal pain      NEW MEDICATIONS STARTED DURING THIS VISIT:  New Prescriptions   OXYCODONE-ACETAMINOPHEN (PERCOCET) 7.5-325 MG TABLET    Take 1 tablet by mouth every 4 (four) hours as needed for severe pain.        This chart was dictated using voice recognition software/Dragon. Despite best efforts to proofread, errors can occur which can change the meaning. Any change was purely unintentional.    Lannie Fields, PA-C 03/21/17 Tami Ribas, MD 03/21/17 2153

## 2017-03-21 NOTE — ED Notes (Signed)
See triage note  states she has had hx vulvar cancer  And has had 2 surgeries in past   But over the past 2 weeks she developed increased pain and similar  Sx's  States she called her md at [facility]  Is waiting for appt time  States she is needing something for pain  Has been taking tylenol every 2-3 hrs

## 2017-03-23 NOTE — Progress Notes (Deleted)
Cardiology Office Note  Date:  03/23/2017   ID:  Renee Mack, DOB 1965-12-26, MRN [id number]  PCP:  Sherlyn Lees, FNP   No chief complaint on file.   HPI:  Renee Mack is a 51 y.o. female who self presents to the emergency department with chest pain and palpitations for the past 4 days. She feels like her heart races intermittently and she has several minute episodes of upper chest discomfort. The pain is moderate severity nonradiating. Not associated with shortness of breath. Nonexertional. She does have a history of insulin-dependent diabetes mellitus as well as COPD and she says she has "a heart problem" although says she has never had a heart attack before. 4 days ago her husband died and she's had difficulty sleeping ever since. She had no pain until her husband died. She is a heavy cigarette smoker. She sleeps on 3 pillows. She has lost weight. She has had no leg swelling. She has no history of DVT or pulmonary embolism.  Cardiac CT from 2014 at [facility]: SPECT: Impressions: - Low risk myocardial perfusion study - There is a small in size, subtle in severity, nearly completely  reversible defect involving the mid anterolateral and mid anterior  segments. This is consistent with possible artifact (misregistration  artifact) and cannot rule out low risk ischemia. - During stress: Global systolic function is hyperdynamic. The ejection  fraction was greater than 65%. - No significant coronary calcifications were noted on the attenuation CT.   PMH:   has a past medical history of Cancer ([place]); COPD (chronic obstructive pulmonary disease) ([place]); Diabetes mellitus without complication ([place]); GERD (gastroesophageal reflux disease); HLD (hyperlipidemia); Pancreatitis; and Shingles.  PSH:    Past Surgical History:  Procedure Laterality Date  . ABDOMINAL HYSTERECTOMY     partial  . ECTOPIC PREGNANCY SURGERY Left   . LYMPHADENECTOMY    . SPLENECTOMY, PARTIAL    . vulvulasectomy       Current Outpatient Prescriptions  Medication Sig Dispense Refill  . amoxicillin (AMOXIL) 875 MG tablet Take 1 tablet (875 mg total) by mouth 2 (two) times daily. X 10 days 20 tablet 0  . aspirin 81 MG tablet Take 81 mg by mouth daily.    Marland Kitchen esomeprazole (NEXIUM) 40 MG capsule Take 40 mg by mouth daily at 12 noon.    Marland Kitchen HYDROcodone-acetaminophen (NORCO) 5-325 MG tablet Take 1 tablet by mouth every 6 (six) hours as needed for moderate pain. (Patient not taking: Reported on 02/06/2017) 20 tablet 0  . insulin aspart (NOVOLOG) 100 UNIT/ML injection Inject 20 Units into the skin 2 (two) times daily at 10 am and 4 pm.    . insulin glargine (LANTUS) 100 UNIT/ML injection Inject 50 Units into the skin 2 (two) times daily.    Marland Kitchen lidocaine (XYLOCAINE) 2 % solution Use as directed 20 mLs in the mouth or throat as needed for mouth pain. (Patient not taking: Reported on 02/06/2017) 100 mL 0  . LORazepam (ATIVAN) 0.5 MG tablet Take 1 tablet (0.5 mg total) by mouth every 8 (eight) hours as needed for anxiety. 6 tablet 0  . ondansetron (ZOFRAN ODT) 4 MG disintegrating tablet Take 1 tablet (4 mg total) by mouth every 8 (eight) hours as needed for nausea or vomiting. 8 tablet 0  . oxyCODONE-acetaminophen (PERCOCET) 7.5-325 MG tablet Take 1 tablet by mouth every 4 (four) hours as needed for severe pain. 30 tablet 0  . polyethylene glycol powder (GLYCOLAX/MIRALAX) powder Dissolve 1 tablespoon in 4-8 ounces of juice  or water and consume 1-2 times daily until you're having a soft bowel movement every day. After that, reduce to just once daily. (Patient not taking: Reported on 02/06/2017) 255 g 0  . traMADol (ULTRAM) 50 MG tablet Take 1 tablet (50 mg total) by mouth every 6 (six) hours as needed. 20 tablet 0   No current facility-administered medications for this visit.      Allergies:   Metformin and related; Darvon [propoxyphene]; Gabapentin; and Nsaids   Social History:  The patient  reports that she has been  smoking Cigarettes.  She has been smoking about 1.00 pack per day. She has never used smokeless tobacco. She reports that she does not drink alcohol or use drugs.   Family History:   family history includes Cancer in her mother; Heart disease in her brother; Osteoporosis in her sister.    Review of Systems: ROS   PHYSICAL EXAM: VS:  There were no vitals taken for this visit. , BMI There is no height or weight on file to calculate BMI. GEN: Well nourished, well developed, in no acute distress HEENT: normal Neck: no JVD, carotid bruits, or masses Cardiac: RRR; no murmurs, rubs, or gallops,no edema  Respiratory:  clear to auscultation bilaterally, normal work of breathing GI: soft, nontender, nondistended, + BS MS: no deformity or atrophy Skin: warm and dry, no rash Neuro:  Strength and sensation are intact Psych: euthymic mood, full affect    Recent Labs: 01/30/2017: TSH 0.700 02/06/2017: ALT 10; BUN 9; Creatinine, Ser 0.69; Hemoglobin 16.4; Platelets 222; Potassium 4.0; Sodium 135    Lipid Panel No results found for: CHOL, HDL, LDLCALC, TRIG    Wt Readings from Last 3 Encounters:  03/21/17 130 lb (59 kg)  03/01/17 130 lb (59 kg)  02/06/17 129 lb (58.5 kg)       ASSESSMENT AND PLAN:  No diagnosis found.   Disposition:   F/U  6 months  No orders of the defined types were placed in this encounter.    Signed, Esmond Plants, M.D., Ph.D. 03/23/2017  [place], [place]

## 2017-03-24 ENCOUNTER — Ambulatory Visit: Payer: Self-pay | Admitting: Cardiovascular Disease

## 2017-04-03 ENCOUNTER — Emergency Department: Payer: Self-pay

## 2017-04-03 ENCOUNTER — Encounter: Payer: Self-pay | Admitting: *Deleted

## 2017-04-03 ENCOUNTER — Emergency Department
Admission: EM | Admit: 2017-04-03 | Discharge: 2017-04-03 | Disposition: A | Discharge status: Home / Self Care | Payer: Self-pay | Attending: Emergency Medicine | Admitting: Emergency Medicine

## 2017-04-03 DIAGNOSIS — Z79899 Other long term (current) drug therapy: Secondary | ICD-10-CM | POA: Insufficient documentation

## 2017-04-03 DIAGNOSIS — E119 Type 2 diabetes mellitus without complications: Secondary | ICD-10-CM | POA: Insufficient documentation

## 2017-04-03 DIAGNOSIS — Z794 Long term (current) use of insulin: Secondary | ICD-10-CM | POA: Insufficient documentation

## 2017-04-03 DIAGNOSIS — F17210 Nicotine dependence, cigarettes, uncomplicated: Secondary | ICD-10-CM | POA: Insufficient documentation

## 2017-04-03 DIAGNOSIS — J449 Chronic obstructive pulmonary disease, unspecified: Secondary | ICD-10-CM | POA: Insufficient documentation

## 2017-04-03 DIAGNOSIS — R1032 Left lower quadrant pain: Secondary | ICD-10-CM | POA: Insufficient documentation

## 2017-04-03 LAB — COMPREHENSIVE METABOLIC PANEL
ALK PHOS: 94 U/L (ref 38–126)
ALT: 36 U/L (ref 14–54)
AST: 44 U/L — AB (ref 15–41)
Albumin: 4 g/dL (ref 3.5–5.0)
Anion gap: 11 (ref 5–15)
BUN: 21 mg/dL — ABNORMAL HIGH (ref 6–20)
CHLORIDE: 98 mmol/L — AB (ref 101–111)
CO2: 25 mmol/L (ref 22–32)
CREATININE: 0.56 mg/dL (ref 0.44–1.00)
Calcium: 9.4 mg/dL (ref 8.9–10.3)
Glucose, Bld: 372 mg/dL — ABNORMAL HIGH (ref 65–99)
Potassium: 4.7 mmol/L (ref 3.5–5.1)
Sodium: 134 mmol/L — ABNORMAL LOW (ref 135–145)
Total Bilirubin: 0.7 mg/dL (ref 0.3–1.2)
Total Protein: 8.4 g/dL — ABNORMAL HIGH (ref 6.5–8.1)

## 2017-04-03 LAB — CBC
HEMATOCRIT: 49.5 % — AB (ref 35.0–47.0)
HEMOGLOBIN: 16.7 g/dL — AB (ref 12.0–16.0)
MCH: 32.1 pg (ref 26.0–34.0)
MCHC: 33.7 g/dL (ref 32.0–36.0)
MCV: 95.3 fL (ref 80.0–100.0)
PLATELETS: 262 10*3/uL (ref 150–440)
RBC: 5.2 MIL/uL (ref 3.80–5.20)
RDW: 14.1 % (ref 11.5–14.5)
WBC: 10.7 10*3/uL (ref 3.6–11.0)

## 2017-04-03 LAB — URINALYSIS, COMPLETE (UACMP) WITH MICROSCOPIC
BACTERIA UA: NONE SEEN
BILIRUBIN URINE: NEGATIVE
Glucose, UA: >=500 mg/dL — AB
Ketones, ur: 20 mg/dL — AB
Leukocytes, UA: NEGATIVE
Nitrite: NEGATIVE
PH: 5 (ref 5.0–8.0)
Protein, ur: NEGATIVE mg/dL
SPECIFIC GRAVITY, URINE: 1.033 — AB (ref 1.005–1.030)

## 2017-04-03 LAB — LIPASE, BLOOD: LIPASE: 15 U/L (ref 11–51)

## 2017-04-03 MED ORDER — MORPHINE SULFATE (PF) 4 MG/ML IV SOLN
4.0000 mg | Freq: Once | INTRAVENOUS | Status: AC
  Administered 2017-04-03: 4 mg via INTRAVENOUS
  Filled 2017-04-03: qty 1

## 2017-04-03 MED ORDER — TRAMADOL HCL 50 MG PO TABS: 50.0000 mg | ORAL_TABLET | Freq: Four times a day (QID) | ORAL | 0 refills | Status: DC | PRN

## 2017-04-03 MED ORDER — IOPAMIDOL (ISOVUE-300) INJECTION 61%
100.0000 mL | Freq: Once | INTRAVENOUS | Status: AC | PRN
  Administered 2017-04-03: 100 mL via INTRAVENOUS

## 2017-04-03 MED ORDER — IOPAMIDOL (ISOVUE-300) INJECTION 61%
30.0000 mL | Freq: Once | INTRAVENOUS | Status: AC | PRN
  Administered 2017-04-03: 30 mL via ORAL

## 2017-04-03 MED ORDER — ONDANSETRON HCL 4 MG/2ML IJ SOLN
4.0000 mg | Freq: Once | INTRAMUSCULAR | Status: AC
  Administered 2017-04-03: 4 mg via INTRAVENOUS
  Filled 2017-04-03: qty 2

## 2017-04-03 MED ORDER — SODIUM CHLORIDE 0.9 % IV SOLN
1000.0000 mL | Freq: Once | INTRAVENOUS | Status: AC
  Administered 2017-04-03: 1000 mL via INTRAVENOUS

## 2017-04-03 MED ORDER — CEPHALEXIN 500 MG PO CAPS: 500.0000 mg | ORAL_CAPSULE | Freq: Two times a day (BID) | ORAL | 0 refills | Status: AC

## 2017-04-03 NOTE — ED Triage Notes (Signed)
FIRST NURSE NOTE-c/o abdominal pain and vomiting. NAD. ambualtory

## 2017-04-03 NOTE — ED Notes (Signed)
Pt ambulated to restroom without difficulty

## 2017-04-03 NOTE — ED Provider Notes (Signed)
[facility] Emergency Department Provider Note   ____________________________________________    I have reviewed the triage vital signs and the nursing notes.   HISTORY  Chief Complaint Abdominal Pain and Flank Pain     HPI Renee Mack is a 51 y.o. female Who presents with left lower quadrant abdominal pain. Patient reports gradually worsening pain in her left lower quadrant which she describes as cramping and moderate in intensity. She has not taken anything for this. She also reports nausea and vomiting. No fevers or chills. No dysuria. No frequency. Has not noticed any hematuria. She reports she has had a kidney infection in the past.   Past Medical History:  Diagnosis Date  . Cancer ([place])   . COPD (chronic obstructive pulmonary disease) ([place])   . Diabetes mellitus without complication ([place])   . GERD (gastroesophageal reflux disease)   . HLD (hyperlipidemia)   . Pancreatitis   . Shingles     Patient Active Problem List   Diagnosis Date Noted  . Pyelonephritis 12/28/2014  . Type 2 diabetes mellitus ([place]) 12/28/2014  . COPD (chronic obstructive pulmonary disease) ([place]) 12/28/2014  . GERD (gastroesophageal reflux disease) 12/28/2014  . History of shingles 12/28/2014  . HLD (hyperlipidemia) 12/28/2014    Past Surgical History:  Procedure Laterality Date  . ABDOMINAL HYSTERECTOMY     partial  . ECTOPIC PREGNANCY SURGERY Left   . LYMPHADENECTOMY    . SPLENECTOMY, PARTIAL    . vulvulasectomy      Prior to Admission medications   Medication Sig Start Date End Date Taking? Authorizing Provider  acetaminophen (TYLENOL) 500 MG tablet Take 1,000 mg by mouth every 6 (six) hours as needed.   Yes [provider]  aspirin 81 MG tablet Take 81 mg by mouth daily.   Yes [provider]  esomeprazole (NEXIUM) 40 MG capsule Take 40 mg by mouth daily at 12 noon.   Yes [provider]  insulin aspart (NOVOLOG) 100 UNIT/ML  injection Inject 20 Units into the skin 2 (two) times daily at 10 am and 4 pm.   Yes [provider]  insulin glargine (LANTUS) 100 UNIT/ML injection Inject 50 Units into the skin 2 (two) times daily.   Yes [provider]  LORazepam (ATIVAN) 0.5 MG tablet Take 1 tablet (0.5 mg total) by mouth every 8 (eight) hours as needed for anxiety. 02/06/17 02/06/18 Yes Lisa Roca, MD  amoxicillin (AMOXIL) 875 MG tablet Take 1 tablet (875 mg total) by mouth 2 (two) times daily. X 10 days Patient not taking: Reported on 04/03/2017 03/01/17   Duanne Guess, PA-C  cephALEXin (KEFLEX) 500 MG capsule Take 1 capsule (500 mg total) by mouth 2 (two) times daily. 04/03/17 04/10/17  Lavonia Drafts, MD  HYDROcodone-acetaminophen (NORCO) 5-325 MG tablet Take 1 tablet by mouth every 6 (six) hours as needed for moderate pain. Patient not taking: Reported on 02/06/2017 05/02/16   Daymon Larsen, MD  lidocaine (XYLOCAINE) 2 % solution Use as directed 20 mLs in the mouth or throat as needed for mouth pain. Patient not taking: Reported on 02/06/2017 01/28/15   Beers, Pierce Crane, PA-C  ondansetron (ZOFRAN ODT) 4 MG disintegrating tablet Take 1 tablet (4 mg total) by mouth every 8 (eight) hours as needed for nausea or vomiting. Patient not taking: Reported on 04/03/2017 02/16/16   Joanne Gavel, MD  polyethylene glycol powder (GLYCOLAX/MIRALAX) powder Dissolve 1 tablespoon in 4-8 ounces of juice or water and consume 1-2  times daily until you're having a soft bowel movement every day. After that, reduce to just once daily. Patient not taking: Reported on 02/06/2017 02/16/16   Joanne Gavel, MD  traMADol (ULTRAM) 50 MG tablet Take 1 tablet (50 mg total) by mouth every 6 (six) hours as needed. 04/03/17 04/03/18  Lavonia Drafts, MD     Allergies Metformin and related; Darvon [propoxyphene]; Gabapentin; and Nsaids  Family History  Problem Relation Age of Onset  . Cancer Mother   . Osteoporosis Sister   . Heart  disease Brother     Social History Social History  Substance Use Topics  . Smoking status: Current Every Day Smoker    Packs/day: 1.00    Types: Cigarettes  . Smokeless tobacco: Never Used  . Alcohol use No    Review of Systems  Constitutional: No fever/chills eyes: No redness ENT: No sore throat. CV: No chest pain respiratory: No shortness of breath Gastrointestinal: as above Genitourinary: +dysuria Musculoskeletal: Negative for back pain. Skin: Negative for rash. Neurological: Negative for headaches     ____________________________________________   PHYSICAL EXAM:  VITAL SIGNS: ED Triage Vitals [04/03/17 0919]  Enc Vitals Group     BP 132/75     Pulse Rate 88     Resp 16     Temp 97.9 F (36.6 C)     Temp Source Oral     SpO2 97 %     Weight 53.5 kg (118 lb)     Height 1.549 m (5\' 1" )     Head Circumference      Peak Flow      Pain Score 9     Pain Loc      Pain Edu?      Excl. in [place]?     Constitutional: Alert and oriented. No acute distress.  Eyes: Conjunctivae are normal.  Head: Atraumatic. Nose: No congestion/rhinnorhea. Mouth/Throat: Mucous membranes are moist.   Cardiovascular: Normal rate, regular rhythm. normal heart sounds Respiratory: Normal respiratory effort.  No retractions.clear to auscultation bilaterally GI: Mild serous palpation left lower quadrant, no CVA tenderness, no distention Genitourinary: deferred Musculoskeletal: No lower extremity tenderness nor edema.   Neurologic:  Normal speech and language. No gross focal neurologic deficits are appreciated.   Skin:  Skin is warm, dry and intact. No rash noted.   ____________________________________________   LABS (all labs ordered are listed, but only abnormal results are displayed)  Labs Reviewed  COMPREHENSIVE METABOLIC PANEL - Abnormal; Notable for the following:       Result Value   Sodium 134 (*)    Chloride 98 (*)    Glucose, Bld 372 (*)    BUN 21 (*)    Total  Protein 8.4 (*)    AST 44 (*)    All other components within normal limits  CBC - Abnormal; Notable for the following:    Hemoglobin 16.7 (*)    HCT 49.5 (*)    All other components within normal limits  URINALYSIS, COMPLETE (UACMP) WITH MICROSCOPIC - Abnormal; Notable for the following:    Color, Urine STRAW (*)    APPearance CLEAR (*)    Specific Gravity, Urine 1.033 (*)    Glucose, UA >=500 (*)    Hgb urine dipstick SMALL (*)    Ketones, ur 20 (*)    Squamous Epithelial / LPF 0-5 (*)    All other components within normal limits  LIPASE, BLOOD   ____________________________________________  EKG   ____________________________________________  RADIOLOGY  CT abdomen and pelvis ____________________________________________   PROCEDURES  Procedure(s) performed: No    Critical Care performed: No ____________________________________________   INITIAL IMPRESSION / ASSESSMENT AND PLAN / ED COURSE  Pertinent labs & imaging results that were available during my care of the patient were reviewed by me and considered in my medical decision making (see chart for details).  patient presents with left lower quadrant abdominal pain, she has tenderness in this area. No distention. Has a significant surgical history. Lab work is overall reassuring, urinalysis unremarkable, we will obtain CT abdomen and pelvis.  Differential diagnosis: Diverticulitis, ureterolithiasis, cystitis, small bowel traction   ----------------------------------------- 2:17 PM on 04/03/2017 -----------------------------------------  CT abdomen and pelvis is unremarkable. Patient feels better after fluids and IV nausea medication and pain medication.  She has follow-up with her gynecologist in one week. She reports dysuria. Will send urine culture and cover with keflex for now  ____________________________________________   FINAL CLINICAL IMPRESSION(S) / ED DIAGNOSES  Final diagnoses:  Left lower  quadrant pain      NEW MEDICATIONS STARTED DURING THIS VISIT:  New Prescriptions   CEPHALEXIN (KEFLEX) 500 MG CAPSULE    Take 1 capsule (500 mg total) by mouth 2 (two) times daily.   TRAMADOL (ULTRAM) 50 MG TABLET    Take 1 tablet (50 mg total) by mouth every 6 (six) hours as needed.     Note:  This document was prepared using Dragon voice recognition software and may include unintentional dictation errors.    Lavonia Drafts, MD 04/03/17 [phone]

## 2017-04-03 NOTE — ED Triage Notes (Signed)
Pt reports lower abd pain with vomiting that began yesterday. Pt denies fever but reports diarrhea, dysuria and pain in left flank.

## 2017-04-04 LAB — URINE CULTURE: Culture: <10000 — AB

## 2017-04-28 DIAGNOSIS — Z7982 Long term (current) use of aspirin: Secondary | ICD-10-CM | POA: Insufficient documentation

## 2017-04-28 DIAGNOSIS — Z79899 Other long term (current) drug therapy: Secondary | ICD-10-CM | POA: Insufficient documentation

## 2017-04-28 DIAGNOSIS — J449 Chronic obstructive pulmonary disease, unspecified: Secondary | ICD-10-CM | POA: Insufficient documentation

## 2017-04-28 DIAGNOSIS — F17210 Nicotine dependence, cigarettes, uncomplicated: Secondary | ICD-10-CM | POA: Insufficient documentation

## 2017-04-28 DIAGNOSIS — R1013 Epigastric pain: Secondary | ICD-10-CM | POA: Insufficient documentation

## 2017-04-28 DIAGNOSIS — E1165 Type 2 diabetes mellitus with hyperglycemia: Secondary | ICD-10-CM | POA: Insufficient documentation

## 2017-04-28 DIAGNOSIS — Z794 Long term (current) use of insulin: Secondary | ICD-10-CM | POA: Insufficient documentation

## 2017-04-29 ENCOUNTER — Emergency Department
Admission: EM | Admit: 2017-04-29 | Discharge: 2017-04-29 | Disposition: A | Discharge status: Home / Self Care | Payer: Self-pay | Attending: Emergency Medicine | Admitting: Emergency Medicine

## 2017-04-29 ENCOUNTER — Other Ambulatory Visit: Payer: Self-pay

## 2017-04-29 DIAGNOSIS — R739 Hyperglycemia, unspecified: Secondary | ICD-10-CM

## 2017-04-29 DIAGNOSIS — R1013 Epigastric pain: Secondary | ICD-10-CM

## 2017-04-29 LAB — BLOOD GAS, VENOUS
ACID-BASE DEFICIT: 2.8 mmol/L — AB (ref 0.0–2.0)
Bicarbonate: 23.7 mmol/L (ref 20.0–28.0)
O2 SAT: 65 %
PATIENT TEMPERATURE: 37
pCO2, Ven: 46 mmHg (ref 44.0–60.0)
pH, Ven: 7.32 (ref 7.250–7.430)
pO2, Ven: 37 mmHg (ref 32.0–45.0)

## 2017-04-29 LAB — BASIC METABOLIC PANEL
ANION GAP: 11 (ref 5–15)
BUN: 18 mg/dL (ref 6–20)
CHLORIDE: 95 mmol/L — AB (ref 101–111)
CO2: 21 mmol/L — ABNORMAL LOW (ref 22–32)
Calcium: 9.7 mg/dL (ref 8.9–10.3)
Creatinine, Ser: 0.52 mg/dL (ref 0.44–1.00)
Glucose, Bld: 549 mg/dL (ref 65–99)
POTASSIUM: 4.3 mmol/L (ref 3.5–5.1)
SODIUM: 127 mmol/L — AB (ref 135–145)

## 2017-04-29 LAB — HEPATIC FUNCTION PANEL
ALBUMIN: 3.9 g/dL (ref 3.5–5.0)
ALT: 17 U/L (ref 14–54)
AST: 20 U/L (ref 15–41)
Alkaline Phosphatase: 96 U/L (ref 38–126)
Bilirubin, Direct: <0.1 mg/dL — ABNORMAL LOW (ref 0.1–0.5)
TOTAL PROTEIN: 7.8 g/dL (ref 6.5–8.1)
Total Bilirubin: 0.8 mg/dL (ref 0.3–1.2)

## 2017-04-29 LAB — URINALYSIS, COMPLETE (UACMP) WITH MICROSCOPIC
BILIRUBIN URINE: NEGATIVE
Bacteria, UA: NONE SEEN
Ketones, ur: NEGATIVE mg/dL
Leukocytes, UA: NEGATIVE
NITRITE: NEGATIVE
PH: 5 (ref 5.0–8.0)
Protein, ur: NEGATIVE mg/dL
SPECIFIC GRAVITY, URINE: 1.025 (ref 1.005–1.030)

## 2017-04-29 LAB — CBC WITH DIFFERENTIAL/PLATELET
Basophils Absolute: 0.1 10*3/uL (ref 0–0.1)
Basophils Relative: 1 %
Eosinophils Absolute: 0.2 10*3/uL (ref 0–0.7)
Eosinophils Relative: 2 %
HEMATOCRIT: 46.8 % (ref 35.0–47.0)
HEMOGLOBIN: 15.3 g/dL (ref 12.0–16.0)
LYMPHS PCT: 35 %
Lymphs Abs: 3.4 10*3/uL (ref 1.0–3.6)
MCH: 31.9 pg (ref 26.0–34.0)
MCHC: 32.8 g/dL (ref 32.0–36.0)
MCV: 97.4 fL (ref 80.0–100.0)
MONOS PCT: 5 %
Monocytes Absolute: 0.5 10*3/uL (ref 0.2–0.9)
NEUTROS ABS: 5.8 10*3/uL (ref 1.4–6.5)
NEUTROS PCT: 57 %
Platelets: 221 10*3/uL (ref 150–440)
RBC: 4.81 MIL/uL (ref 3.80–5.20)
RDW: 14.3 % (ref 11.5–14.5)
WBC: 9.9 10*3/uL (ref 3.6–11.0)

## 2017-04-29 LAB — BETA-HYDROXYBUTYRIC ACID: Beta-Hydroxybutyric Acid: 0.29 mmol/L — ABNORMAL HIGH (ref 0.05–0.27)

## 2017-04-29 LAB — LIPASE, BLOOD: Lipase: 32 U/L (ref 11–51)

## 2017-04-29 MED ORDER — SODIUM CHLORIDE 0.9 % IV BOLUS (SEPSIS)
1000.0000 mL | Freq: Once | INTRAVENOUS | Status: AC
  Administered 2017-04-29: 1000 mL via INTRAVENOUS

## 2017-04-29 MED ORDER — INSULIN ASPART 100 UNIT/ML ~~LOC~~ SOLN
10.0000 [IU] | Freq: Once | SUBCUTANEOUS | Status: AC
  Administered 2017-04-29: 10 [IU] via SUBCUTANEOUS
  Filled 2017-04-29: qty 1

## 2017-04-29 MED ORDER — ONDANSETRON HCL 4 MG/2ML IJ SOLN
4.0000 mg | Freq: Once | INTRAMUSCULAR | Status: AC
  Administered 2017-04-29: 4 mg via INTRAVENOUS
  Filled 2017-04-29: qty 2

## 2017-04-29 MED ORDER — OXYCODONE-ACETAMINOPHEN 5-325 MG PO TABS: 1.0000 | ORAL_TABLET | Freq: Four times a day (QID) | ORAL | 0 refills | Status: DC | PRN

## 2017-04-29 MED ORDER — MORPHINE SULFATE (PF) 4 MG/ML IV SOLN
4.0000 mg | Freq: Once | INTRAVENOUS | Status: AC
  Administered 2017-04-29: 4 mg via INTRAVENOUS
  Filled 2017-04-29: qty 1

## 2017-04-29 NOTE — ED Triage Notes (Signed)
Pt in with co hyperglycemia and epigastric pain. States fsbs at home have been reading high, is taking meds as directed. Also states has hx of pancreatitis and pain is similar.

## 2017-04-29 NOTE — ED Notes (Signed)
Date and time results received: 04/29/17 0115 (use smartphrase ".now" to insert current time)  Test: GLU Critical Value: 549  Name of Provider Notified: Rifenbark, MD  Orders Received? Or Actions Taken?: No new orders at this time.

## 2017-04-29 NOTE — ED Provider Notes (Signed)
[facility] Emergency Department Provider Note  ____________________________________________   First MD Initiated Contact with Patient 04/29/17 0018     (approximate)  I have reviewed the triage vital signs and the nursing notes.   HISTORY  Chief Complaint Hyperglycemia and Abdominal Pain   HPI Renee Mack is a 51 y.o. female who self presents to the emergency department with elevated blood sugar that she noted at home today.  She has a past medical history of insulin-dependent diabetes mellitus type 2.  Today she noted gradual onset moderate to severe epigastric burning pain radiating to her back that felt like previous episodes of pancreatitis.  Shortly thereafter she checked her blood sugar and it was in the 400s.  She subsequently took 50 units of Lantus at night and after she checked her sugar again it was critical high.  She denies fevers or chills.  She denies nausea or vomiting.  Her symptoms began gradually and have been progressive.  Nothing seems to make it better or worse.    Past Medical History:  Diagnosis Date  . Cancer ([place])   . COPD (chronic obstructive pulmonary disease) ([place])   . Diabetes mellitus without complication ([place])   . GERD (gastroesophageal reflux disease)   . HLD (hyperlipidemia)   . Pancreatitis   . Shingles     Patient Active Problem List   Diagnosis Date Noted  . Pyelonephritis 12/28/2014  . Type 2 diabetes mellitus ([place]) 12/28/2014  . COPD (chronic obstructive pulmonary disease) ([place]) 12/28/2014  . GERD (gastroesophageal reflux disease) 12/28/2014  . History of shingles 12/28/2014  . HLD (hyperlipidemia) 12/28/2014    Past Surgical History:  Procedure Laterality Date  . ABDOMINAL HYSTERECTOMY     partial  . ECTOPIC PREGNANCY SURGERY Left   . LYMPHADENECTOMY    . SPLENECTOMY, PARTIAL    . vulvulasectomy      Prior to Admission medications   Medication Sig Start Date End Date Taking? Authorizing Provider    acetaminophen (TYLENOL) 325 MG tablet Take 650 mg every 6 (six) hours as needed by mouth.    Yes [provider]  aspirin 81 MG tablet Take 81 mg by mouth daily.   Yes [provider]  citalopram (CELEXA) 20 MG tablet Take 20 mg daily by mouth.   Yes [provider]  esomeprazole (NEXIUM) 40 MG capsule Take 40 mg by mouth daily at 12 noon.   Yes [provider]  insulin aspart (NOVOLOG) 100 UNIT/ML injection Inject 20 Units into the skin 2 (two) times daily at 10 am and 4 pm.   Yes [provider]  insulin glargine (LANTUS) 100 UNIT/ML injection Inject 50 Units into the skin 2 (two) times daily.   Yes [provider]  LORazepam (ATIVAN) 0.5 MG tablet Take 1 tablet (0.5 mg total) by mouth every 8 (eight) hours as needed for anxiety. 02/06/17 02/06/18 Yes Lisa Roca, MD  amoxicillin (AMOXIL) 875 MG tablet Take 1 tablet (875 mg total) by mouth 2 (two) times daily. X 10 days Patient not taking: Reported on 04/03/2017 03/01/17   Duanne Guess, PA-C  HYDROcodone-acetaminophen [facility]) 5-325 MG tablet Take 1 tablet by mouth every 6 (six) hours as needed for moderate pain. Patient not taking: Reported on 02/06/2017 05/02/16   Daymon Larsen, MD  lidocaine (XYLOCAINE) 2 % solution Use as directed 20 mLs in the mouth or throat as needed for mouth pain. Patient not taking: Reported on 02/06/2017 01/28/15   Arlyss Repress,  PA-C  ondansetron (ZOFRAN ODT) 4 MG disintegrating tablet Take 1 tablet (4 mg total) by mouth every 8 (eight) hours as needed for nausea or vomiting. Patient not taking: Reported on 04/03/2017 02/16/16   Joanne Gavel, MD  oxyCODONE-acetaminophen (ROXICET) 5-325 MG tablet Take 1 tablet every 6 (six) hours as needed by mouth for severe pain. 04/29/17   Darel Hong, MD  polyethylene glycol powder (GLYCOLAX/MIRALAX) powder Dissolve 1 tablespoon in 4-8 ounces of juice or water and consume 1-2 times daily until you're having a soft bowel  movement every day. After that, reduce to just once daily. Patient not taking: Reported on 02/06/2017 02/16/16   Joanne Gavel, MD    Allergies Metformin and related; Darvon [propoxyphene]; Gabapentin; Nsaids; and Tramadol  Family History  Problem Relation Age of Onset  . Cancer Mother   . Osteoporosis Sister   . Heart disease Brother     Social History Social History   Tobacco Use  . Smoking status: Current Every Day Smoker    Packs/day: 1.00    Types: Cigarettes  . Smokeless tobacco: Never Used  Substance Use Topics  . Alcohol use: No  . Drug use: No    Review of Systems Constitutional: No fever/chills Eyes: No visual changes. ENT: No sore throat. Cardiovascular: Denies chest pain. Respiratory: Denies shortness of breath. Gastrointestinal: Positive for abdominal pain.  No nausea, no vomiting.  No diarrhea.  No constipation. Genitourinary: Negative for dysuria. Musculoskeletal: Negative for back pain. Skin: Negative for rash. Neurological: Negative for headaches, focal weakness or numbness.   ____________________________________________   PHYSICAL EXAM:  VITAL SIGNS: ED Triage Vitals  Enc Vitals Group     BP 04/29/17 0002 (!) 150/83     Pulse Rate 04/29/17 0002 82     Resp 04/29/17 0002 20     Temp 04/29/17 0003 97.8 F (36.6 C)     Temp Source 04/29/17 0003 Oral     SpO2 04/29/17 0002 99 %     Weight 04/29/17 0002 118 lb (53.5 kg)     Height 04/29/17 0002 5\' 1"  (1.549 m)     Head Circumference --      Peak Flow --      Pain Score 04/29/17 0001 10     Pain Loc --      Pain Edu? --      Excl. in [place]? --     Constitutional: Alert and oriented x4 pleasant cooperative speaks full clear sentences no diaphoresis no ketones on the breath Eyes: PERRL EOMI. Head: Atraumatic. Nose: No congestion/rhinnorhea. Mouth/Throat: No trismus Neck: No stridor.   Cardiovascular: Normal rate, regular rhythm. Grossly normal heart sounds.  Good peripheral  circulation. Respiratory: Normal respiratory effort.  No retractions. Lungs CTAB and moving good air Gastrointestinal: Soft nondistended nontender no rebound or guarding no peritonitis no McBurney's tenderness in the [place]'s no costovertebral tenderness Musculoskeletal: No lower extremity edema   Neurologic:  Normal speech and language. No gross focal neurologic deficits are appreciated. Skin:  Skin is warm, dry and intact. No rash noted. Psychiatric: Mood and affect are normal. Speech and behavior are normal.    ____________________________________________   DIFFERENTIAL includes but not limited to  Diabetic ketoacidosis, HHS, pancreatitis, metabolic ____________________________________________   LABS (all labs ordered are listed, but only abnormal results are displayed)  Labs Reviewed  BASIC METABOLIC PANEL - Abnormal; Notable for the following components:      Result Value   Sodium 127 (*)    Chloride 95 (*)  CO2 21 (*)    Glucose, Bld 549 (*)    All other components within normal limits  HEPATIC FUNCTION PANEL - Abnormal; Notable for the following components:   Bilirubin, Direct <0.1 (*)    All other components within normal limits  BLOOD GAS, VENOUS - Abnormal; Notable for the following components:   Acid-base deficit 2.8 (*)    All other components within normal limits  URINALYSIS, COMPLETE (UACMP) WITH MICROSCOPIC - Abnormal; Notable for the following components:   Color, Urine STRAW (*)    APPearance CLEAR (*)    Glucose, UA >=500 (*)    Hgb urine dipstick MODERATE (*)    Squamous Epithelial / LPF 0-5 (*)    All other components within normal limits  BETA-HYDROXYBUTYRIC ACID - Abnormal; Notable for the following components:   Beta-Hydroxybutyric Acid 0.29 (*)    All other components within normal limits  LIPASE, BLOOD  CBC WITH DIFFERENTIAL/PLATELET    Blood work reviewed and interpreted by me shows elevated blood glucose but no signs of diabetic  ketoacidosis.  Her sodium is low but corrects to 138 when taking into account the blood sugar __________________________________________  EKG   ____________________________________________  RADIOLOGY   ____________________________________________   PROCEDURES  Procedure(s) performed: no  Procedures  Critical Care performed: no  Observation: no ____________________________________________   INITIAL IMPRESSION / ASSESSMENT AND PLAN / ED COURSE  Pertinent labs & imaging results that were available during my care of the patient were reviewed by me and considered in my medical decision making (see chart for details).  Patient arrives hemodynamically stable and quite well-appearing.  She does have an elevated blood glucose although it came down nicely after fluids and a low dose of insulin.  She has no evidence of diabetic ketoacidosis.  Her pain is well controlled after 2 doses of IV morphine.  Her lipase is normal and she had a recent CT scan 2 weeks ago which was normal as well.  She very well may have early pancreatitis however she is able to eat and drink at this time and at this point she is medically stable for outpatient management.  She verbalized understanding and agree with plan      ____________________________________________   FINAL CLINICAL IMPRESSION(S) / ED DIAGNOSES  Final diagnoses:  Epigastric pain  Hyperglycemia      NEW MEDICATIONS STARTED DURING THIS VISIT:  This SmartLink is deprecated. Use AVSMEDLIST instead to display the medication list for a patient.   Note:  This document was prepared using Dragon voice recognition software and may include unintentional dictation errors.     Darel Hong, MD 04/29/17 [phone]

## 2017-04-29 NOTE — Discharge Instructions (Signed)
Please take your pain medication as needed for severe symptoms and make sure you continue taking your insulin as prescribed.  Follow-up with your primary care physician tomorrow for recheck.  Return to the emergency department sooner for any concerns whatsoever.  It was a pleasure to take care of you today, and thank you for coming to our emergency department.  If you have any questions or concerns before leaving please ask the nurse to grab me and I'm more than happy to go through your aftercare instructions again.  If you were prescribed any opioid pain medication today such as Norco, Vicodin, Percocet, morphine, hydrocodone, or oxycodone please make sure you do not drive when you are taking this medication as it can alter your ability to drive safely.  If you have any concerns once you are home that you are not improving or are in fact getting worse before you can make it to your follow-up appointment, please do not hesitate to call 911 and come back for further evaluation.  Darel Hong, MD  Results for orders placed or performed during the hospital encounter of 77/41/28  Basic metabolic panel  Result Value Ref Range   Sodium 127 (L) 135 - 145 mmol/L   Potassium 4.3 3.5 - 5.1 mmol/L   Chloride 95 (L) 101 - 111 mmol/L   CO2 21 (L) 22 - 32 mmol/L   Glucose, Bld 549 (HH) 65 - 99 mg/dL   BUN 18 6 - 20 mg/dL   Creatinine, Ser 0.52 0.44 - 1.00 mg/dL   Calcium 9.7 8.9 - 10.3 mg/dL   GFR calc non Af Amer >60 >60 mL/min   GFR calc Af Amer >60 >60 mL/min   Anion gap 11 5 - 15  Hepatic function panel  Result Value Ref Range   Total Protein 7.8 6.5 - 8.1 g/dL   Albumin 3.9 3.5 - 5.0 g/dL   AST 20 15 - 41 U/L   ALT 17 14 - 54 U/L   Alkaline Phosphatase 96 38 - 126 U/L   Total Bilirubin 0.8 0.3 - 1.2 mg/dL   Bilirubin, Direct <0.1 (L) 0.1 - 0.5 mg/dL   Indirect Bilirubin NOT CALCULATED 0.3 - 0.9 mg/dL  Lipase, blood  Result Value Ref Range   Lipase 32 11 - 51 U/L  CBC with Differential    Result Value Ref Range   WBC 9.9 3.6 - 11.0 K/uL   RBC 4.81 3.80 - 5.20 MIL/uL   Hemoglobin 15.3 12.0 - 16.0 g/dL   HCT 46.8 35.0 - 47.0 %   MCV 97.4 80.0 - 100.0 fL   MCH 31.9 26.0 - 34.0 pg   MCHC 32.8 32.0 - 36.0 g/dL   RDW 14.3 11.5 - 14.5 %   Platelets 221 150 - 440 K/uL   Neutrophils Relative % 57 %   Neutro Abs 5.8 1.4 - 6.5 K/uL   Lymphocytes Relative 35 %   Lymphs Abs 3.4 1.0 - 3.6 K/uL   Monocytes Relative 5 %   Monocytes Absolute 0.5 0.2 - 0.9 K/uL   Eosinophils Relative 2 %   Eosinophils Absolute 0.2 0 - 0.7 K/uL   Basophils Relative 1 %   Basophils Absolute 0.1 0 - 0.1 K/uL  Blood gas, venous  Result Value Ref Range   pH, Ven 7.32 7.250 - 7.430   pCO2, Ven 46 44.0 - 60.0 mmHg   pO2, Ven 37.0 32.0 - 45.0 mmHg   Bicarbonate 23.7 20.0 - 28.0 mmol/L   Acid-base deficit 2.8 (  H) 0.0 - 2.0 mmol/L   O2 Saturation 65.0 %   Patient temperature 37.0   Urinalysis, Complete w Microscopic  Result Value Ref Range   Color, Urine STRAW (A) YELLOW   APPearance CLEAR (A) CLEAR   Specific Gravity, Urine 1.025 1.005 - 1.030   pH 5.0 5.0 - 8.0   Glucose, UA >=500 (A) NEGATIVE mg/dL   Hgb urine dipstick MODERATE (A) NEGATIVE   Bilirubin Urine NEGATIVE NEGATIVE   Ketones, ur NEGATIVE NEGATIVE mg/dL   Protein, ur NEGATIVE NEGATIVE mg/dL   Nitrite NEGATIVE NEGATIVE   Leukocytes, UA NEGATIVE NEGATIVE   RBC / HPF 6-30 0 - 5 RBC/hpf   WBC, UA 6-30 0 - 5 WBC/hpf   Bacteria, UA NONE SEEN NONE SEEN   Squamous Epithelial / LPF 0-5 (A) NONE SEEN  Beta-hydroxybutyric acid  Result Value Ref Range   Beta-Hydroxybutyric Acid 0.29 (H) 0.05 - 0.27 mmol/L   Ct Abdomen Pelvis W Contrast  Result Date: 04/03/2017 CLINICAL DATA:  Diffuse abdominal pain radiating into the low back for 2 days. EXAM: CT ABDOMEN AND PELVIS WITH CONTRAST TECHNIQUE: Multidetector CT imaging of the abdomen and pelvis was performed using the standard protocol following bolus administration of intravenous contrast.  CONTRAST:  100 ml ISOVUE-300 IOPAMIDOL (ISOVUE-300) INJECTION 61% COMPARISON:  CT abdomen and pelvis 02/16/2016. FINDINGS: Lower chest: Mild dependent atelectasis in the lung bases. No pleural or pericardial effusion. Heart size normal. Hepatobiliary: No focal liver abnormality is seen. No gallstones, gallbladder wall thickening, or biliary dilatation. Pancreas: Unremarkable. No pancreatic ductal dilatation or surrounding inflammatory changes. Spleen: Normal in size without focal abnormality. Adrenals/Urinary Tract: Adrenal glands are unremarkable. Kidneys are normal, without renal calculi, focal lesion, or hydronephrosis. Bladder is unremarkable. Stomach/Bowel: Stomach is within normal limits. Appendix appears normal. No evidence of bowel wall thickening, distention, or inflammatory changes. Vascular/Lymphatic: Extensive aortoiliac atherosclerosis is present. No aneurysm. No lymphadenopathy. Reproductive: Status post hysterectomy. No adnexal masses. Other: No ascites. Small fat containing supraumbilical ventral hernia is seen. Musculoskeletal: No focal lesion. Convex right lumbar scoliosis noted. IMPRESSION: No acute abnormality abdomen or pelvis. No finding to explain the patient's symptoms. Extensive atherosclerosis. Small fat containing supraumbilical ventral hernia. Electronically Signed   By: Inge Rise M.D.   On: 04/03/2017 14:00

## 2017-05-03 LAB — GLUCOSE, CAPILLARY: Glucose-Capillary: 545 mg/dL (ref 65–99)

## 2017-05-21 NOTE — Progress Notes (Deleted)
Cardiology Office Note  Date:  05/21/2017   ID:  Jaydn Fincher, DOB 1966-04-24, MRN [id number]  PCP:  Sherlyn Lees, FNP   No chief complaint on file.   HPI:  Renee Mack is a 51 y.o. female  pancreatitis  insulin-dependent diabetes mellitus type 2.   Copd hyperlipidemia  moderate to severe epigastric burning pain radiating to her back that felt like previous episodes of pancreatitis.    ER 04/29/2017 Elevated glucose  PMH:   has a past medical history of Cancer ([place]), COPD (chronic obstructive pulmonary disease) ([place]), Diabetes mellitus without complication ([place]), GERD (gastroesophageal reflux disease), HLD (hyperlipidemia), Pancreatitis, and Shingles.  PSH:    Past Surgical History:  Procedure Laterality Date  . ABDOMINAL HYSTERECTOMY     partial  . ECTOPIC PREGNANCY SURGERY Left   . LYMPHADENECTOMY    . SPLENECTOMY, PARTIAL    . vulvulasectomy      Current Outpatient Medications  Medication Sig Dispense Refill  . acetaminophen (TYLENOL) 325 MG tablet Take 650 mg every 6 (six) hours as needed by mouth.     Marland Kitchen amoxicillin (AMOXIL) 875 MG tablet Take 1 tablet (875 mg total) by mouth 2 (two) times daily. X 10 days (Patient not taking: Reported on 04/03/2017) 20 tablet 0  . aspirin 81 MG tablet Take 81 mg by mouth daily.    . citalopram (CELEXA) 20 MG tablet Take 20 mg daily by mouth.    . esomeprazole (NEXIUM) 40 MG capsule Take 40 mg by mouth daily at 12 noon.    Marland Kitchen HYDROcodone-acetaminophen (NORCO) 5-325 MG tablet Take 1 tablet by mouth every 6 (six) hours as needed for moderate pain. (Patient not taking: Reported on 02/06/2017) 20 tablet 0  . insulin aspart (NOVOLOG) 100 UNIT/ML injection Inject 20 Units into the skin 2 (two) times daily at 10 am and 4 pm.    . insulin glargine (LANTUS) 100 UNIT/ML injection Inject 50 Units into the skin 2 (two) times daily.    Marland Kitchen lidocaine (XYLOCAINE) 2 % solution Use as directed 20 mLs in the mouth or throat as needed for mouth pain.  (Patient not taking: Reported on 02/06/2017) 100 mL 0  . LORazepam (ATIVAN) 0.5 MG tablet Take 1 tablet (0.5 mg total) by mouth every 8 (eight) hours as needed for anxiety. 6 tablet 0  . ondansetron (ZOFRAN ODT) 4 MG disintegrating tablet Take 1 tablet (4 mg total) by mouth every 8 (eight) hours as needed for nausea or vomiting. (Patient not taking: Reported on 04/03/2017) 8 tablet 0  . oxyCODONE-acetaminophen (ROXICET) 5-325 MG tablet Take 1 tablet every 6 (six) hours as needed by mouth for severe pain. 3 tablet 0  . polyethylene glycol powder (GLYCOLAX/MIRALAX) powder Dissolve 1 tablespoon in 4-8 ounces of juice or water and consume 1-2 times daily until you're having a soft bowel movement every day. After that, reduce to just once daily. (Patient not taking: Reported on 02/06/2017) 255 g 0   No current facility-administered medications for this visit.      Allergies:   Metformin and related; Darvon [propoxyphene]; Gabapentin; Nsaids; and Tramadol   Social History:  The patient  reports that she has been smoking cigarettes.  She has been smoking about 1.00 pack per day. she has never used smokeless tobacco. She reports that she does not drink alcohol or use drugs.   Family History:   family history includes Cancer in her mother; Heart disease in her brother; Osteoporosis in her sister.    Review of  Systems: ROS   PHYSICAL EXAM: VS:  There were no vitals taken for this visit. , BMI There is no height or weight on file to calculate BMI. GEN: Well nourished, well developed, in no acute distress HEENT: normal Neck: no JVD, carotid bruits, or masses Cardiac: RRR; no murmurs, rubs, or gallops,no edema  Respiratory:  clear to auscultation bilaterally, normal work of breathing GI: soft, nontender, nondistended, + BS MS: no deformity or atrophy Skin: warm and dry, no rash Neuro:  Strength and sensation are intact Psych: euthymic mood, full affect    Recent Labs: 01/30/2017: TSH  0.700 04/29/2017: ALT 17; BUN 18; Creatinine, Ser 0.52; Hemoglobin 15.3; Platelets 221; Potassium 4.3; Sodium 127    Lipid Panel No results found for: CHOL, HDL, LDLCALC, TRIG    Wt Readings from Last 3 Encounters:  04/29/17 118 lb (53.5 kg)  04/03/17 118 lb (53.5 kg)  03/21/17 130 lb (59 kg)       ASSESSMENT AND PLAN:  No diagnosis found.   Disposition:   F/U  6 months  No orders of the defined types were placed in this encounter.    Signed, Esmond Plants, M.D., Ph.D. 05/21/2017  [place], [place]

## 2017-05-23 ENCOUNTER — Ambulatory Visit: Payer: Self-pay | Admitting: Cardiovascular Disease

## 2017-06-01 ENCOUNTER — Encounter: Payer: Self-pay | Admitting: Cardiovascular Disease

## 2017-06-12 ENCOUNTER — Emergency Department: Payer: Medicaid Other

## 2017-06-12 ENCOUNTER — Inpatient Hospital Stay
Admission: EM | Admit: 2017-06-12 | Discharge: 2017-06-16 | DRG: 391 | Disposition: A | Discharge status: Home / Self Care | Payer: Medicaid Other | Attending: Internal Medicine | Admitting: Internal Medicine

## 2017-06-12 ENCOUNTER — Other Ambulatory Visit: Payer: Self-pay

## 2017-06-12 ENCOUNTER — Encounter: Payer: Self-pay | Admitting: Emergency Medicine

## 2017-06-12 DIAGNOSIS — Z794 Long term (current) use of insulin: Secondary | ICD-10-CM

## 2017-06-12 DIAGNOSIS — K55019 Acute (reversible) ischemia of small intestine, extent unspecified: Secondary | ICD-10-CM | POA: Diagnosis present

## 2017-06-12 DIAGNOSIS — E785 Hyperlipidemia, unspecified: Secondary | ICD-10-CM | POA: Diagnosis present

## 2017-06-12 DIAGNOSIS — Z90710 Acquired absence of both cervix and uterus: Secondary | ICD-10-CM

## 2017-06-12 DIAGNOSIS — E871 Hypo-osmolality and hyponatremia: Secondary | ICD-10-CM | POA: Diagnosis present

## 2017-06-12 DIAGNOSIS — K219 Gastro-esophageal reflux disease without esophagitis: Secondary | ICD-10-CM | POA: Diagnosis present

## 2017-06-12 DIAGNOSIS — F17210 Nicotine dependence, cigarettes, uncomplicated: Secondary | ICD-10-CM | POA: Diagnosis present

## 2017-06-12 DIAGNOSIS — A09 Infectious gastroenteritis and colitis, unspecified: Principal | ICD-10-CM | POA: Diagnosis present

## 2017-06-12 DIAGNOSIS — E878 Other disorders of electrolyte and fluid balance, not elsewhere classified: Secondary | ICD-10-CM | POA: Diagnosis present

## 2017-06-12 DIAGNOSIS — J449 Chronic obstructive pulmonary disease, unspecified: Secondary | ICD-10-CM | POA: Diagnosis present

## 2017-06-12 DIAGNOSIS — G894 Chronic pain syndrome: Secondary | ICD-10-CM | POA: Diagnosis present

## 2017-06-12 DIAGNOSIS — E1165 Type 2 diabetes mellitus with hyperglycemia: Secondary | ICD-10-CM | POA: Diagnosis present

## 2017-06-12 DIAGNOSIS — R739 Hyperglycemia, unspecified: Secondary | ICD-10-CM

## 2017-06-12 DIAGNOSIS — Z23 Encounter for immunization: Secondary | ICD-10-CM

## 2017-06-12 DIAGNOSIS — R112 Nausea with vomiting, unspecified: Secondary | ICD-10-CM | POA: Diagnosis not present

## 2017-06-12 DIAGNOSIS — F329 Major depressive disorder, single episode, unspecified: Secondary | ICD-10-CM | POA: Diagnosis present

## 2017-06-12 DIAGNOSIS — Z79891 Long term (current) use of opiate analgesic: Secondary | ICD-10-CM

## 2017-06-12 DIAGNOSIS — Z885 Allergy status to narcotic agent status: Secondary | ICD-10-CM | POA: Diagnosis not present

## 2017-06-12 DIAGNOSIS — Z79899 Other long term (current) drug therapy: Secondary | ICD-10-CM

## 2017-06-12 DIAGNOSIS — Z7982 Long term (current) use of aspirin: Secondary | ICD-10-CM | POA: Diagnosis not present

## 2017-06-12 DIAGNOSIS — Z8619 Personal history of other infectious and parasitic diseases: Secondary | ICD-10-CM

## 2017-06-12 DIAGNOSIS — Z888 Allergy status to other drugs, medicaments and biological substances status: Secondary | ICD-10-CM | POA: Diagnosis not present

## 2017-06-12 DIAGNOSIS — Z9081 Acquired absence of spleen: Secondary | ICD-10-CM

## 2017-06-12 DIAGNOSIS — Z886 Allergy status to analgesic agent status: Secondary | ICD-10-CM

## 2017-06-12 DIAGNOSIS — R1010 Upper abdominal pain, unspecified: Secondary | ICD-10-CM

## 2017-06-12 LAB — CBC
HEMATOCRIT: 46.6 % (ref 35.0–47.0)
HEMOGLOBIN: 15.2 g/dL (ref 12.0–16.0)
MCH: 32.7 pg (ref 26.0–34.0)
MCHC: 32.6 g/dL (ref 32.0–36.0)
MCV: 100.4 fL — AB (ref 80.0–100.0)
Platelets: 223 10*3/uL (ref 150–440)
RBC: 4.64 MIL/uL (ref 3.80–5.20)
RDW: 14.4 % (ref 11.5–14.5)
WBC: 8.6 10*3/uL (ref 3.6–11.0)

## 2017-06-12 LAB — GLUCOSE, CAPILLARY
GLUCOSE-CAPILLARY: 543 mg/dL — AB (ref 65–99)
GLUCOSE-CAPILLARY: 353 mg/dL — AB (ref 65–99)
Glucose-Capillary: 377 mg/dL — ABNORMAL HIGH (ref 65–99)

## 2017-06-12 LAB — HEPATIC FUNCTION PANEL
ALT: 33 U/L (ref 14–54)
AST: 43 U/L — ABNORMAL HIGH (ref 15–41)
Albumin: 3.8 g/dL (ref 3.5–5.0)
Alkaline Phosphatase: 88 U/L (ref 38–126)
Bilirubin, Direct: 0.1 mg/dL (ref 0.1–0.5)
Indirect Bilirubin: 0.8 mg/dL (ref 0.3–0.9)
Total Bilirubin: 0.9 mg/dL (ref 0.3–1.2)
Total Protein: 7.5 g/dL (ref 6.5–8.1)

## 2017-06-12 LAB — URINALYSIS, COMPLETE (UACMP) WITH MICROSCOPIC
Bilirubin Urine: NEGATIVE
Glucose, UA: >=500 mg/dL — AB
Hgb urine dipstick: NEGATIVE
Ketones, ur: 5 mg/dL — AB
Leukocytes, UA: NEGATIVE
Nitrite: NEGATIVE
Protein, ur: NEGATIVE mg/dL
Specific Gravity, Urine: 1.024 (ref 1.005–1.030)
pH: 5 (ref 5.0–8.0)

## 2017-06-12 LAB — BASIC METABOLIC PANEL
ANION GAP: 12 (ref 5–15)
BUN: 19 mg/dL (ref 6–20)
CHLORIDE: 90 mmol/L — AB (ref 101–111)
CO2: 19 mmol/L — AB (ref 22–32)
Calcium: 9.5 mg/dL (ref 8.9–10.3)
Creatinine, Ser: 0.65 mg/dL (ref 0.44–1.00)
GFR calc Af Amer: >60 mL/min (ref >60)
GLUCOSE: 695 mg/dL — AB (ref 65–99)
POTASSIUM: 4.8 mmol/L (ref 3.5–5.1)
Sodium: 121 mmol/L — ABNORMAL LOW (ref 135–145)

## 2017-06-12 LAB — LIPASE, BLOOD: Lipase: 24 U/L (ref 11–51)

## 2017-06-12 MED ORDER — HYDROCODONE-ACETAMINOPHEN 5-325 MG PO TABS
1.0000 | ORAL_TABLET | ORAL | Status: DC | PRN
  Administered 2017-06-13 – 2017-06-16 (×12): 2 via ORAL
  Filled 2017-06-12 (×12): qty 2

## 2017-06-12 MED ORDER — ONDANSETRON HCL 4 MG PO TABS
4.0000 mg | ORAL_TABLET | Freq: Four times a day (QID) | ORAL | Status: DC | PRN
  Administered 2017-06-14: 4 mg via ORAL
  Filled 2017-06-12: qty 1

## 2017-06-12 MED ORDER — OXYCODONE-ACETAMINOPHEN 5-325 MG PO TABS
1.0000 | ORAL_TABLET | Freq: Four times a day (QID) | ORAL | Status: DC | PRN
  Administered 2017-06-13 – 2017-06-16 (×4): 1 via ORAL
  Filled 2017-06-12 (×4): qty 1

## 2017-06-12 MED ORDER — INSULIN ASPART 100 UNIT/ML ~~LOC~~ SOLN
0.0000 [IU] | SUBCUTANEOUS | Status: DC
  Administered 2017-06-12: 15 [IU] via SUBCUTANEOUS
  Administered 2017-06-13: 3 [IU] via SUBCUTANEOUS
  Administered 2017-06-13 (×2): 5 [IU] via SUBCUTANEOUS
  Filled 2017-06-12 (×4): qty 1

## 2017-06-12 MED ORDER — INSULIN GLARGINE 100 UNIT/ML ~~LOC~~ SOLN
25.0000 [IU] | Freq: Two times a day (BID) | SUBCUTANEOUS | Status: DC
  Administered 2017-06-12 – 2017-06-14 (×4): 25 [IU] via SUBCUTANEOUS
  Filled 2017-06-12 (×5): qty 0.25

## 2017-06-12 MED ORDER — INFLUENZA VAC SPLIT QUAD 0.5 ML IM SUSY
0.5000 mL | PREFILLED_SYRINGE | INTRAMUSCULAR | Status: AC
  Administered 2017-06-15: 0.5 mL via INTRAMUSCULAR
  Filled 2017-06-12: qty 0.5

## 2017-06-12 MED ORDER — SODIUM CHLORIDE 0.9 % IV SOLN
INTRAVENOUS | Status: AC
  Administered 2017-06-12 – 2017-06-13 (×4): via INTRAVENOUS

## 2017-06-12 MED ORDER — SODIUM CHLORIDE 0.9 % IV BOLUS (SEPSIS)
1000.0000 mL | Freq: Once | INTRAVENOUS | Status: AC
  Administered 2017-06-12: 1000 mL via INTRAVENOUS

## 2017-06-12 MED ORDER — INSULIN ASPART 100 UNIT/ML ~~LOC~~ SOLN
5.0000 [IU] | Freq: Once | SUBCUTANEOUS | Status: AC
  Administered 2017-06-12: 5 [IU] via INTRAVENOUS
  Filled 2017-06-12: qty 1

## 2017-06-12 MED ORDER — ENOXAPARIN SODIUM 40 MG/0.4ML ~~LOC~~ SOLN
40.0000 mg | SUBCUTANEOUS | Status: DC
  Administered 2017-06-12 – 2017-06-15 (×4): 40 mg via SUBCUTANEOUS
  Filled 2017-06-12 (×4): qty 0.4

## 2017-06-12 MED ORDER — CIPROFLOXACIN IN D5W 400 MG/200ML IV SOLN
400.0000 mg | Freq: Two times a day (BID) | INTRAVENOUS | Status: DC
  Administered 2017-06-12 – 2017-06-16 (×8): 400 mg via INTRAVENOUS
  Filled 2017-06-12 (×10): qty 200

## 2017-06-12 MED ORDER — MORPHINE SULFATE (PF) 2 MG/ML IV SOLN
2.0000 mg | INTRAVENOUS | Status: DC | PRN
  Administered 2017-06-12 – 2017-06-16 (×20): 2 mg via INTRAVENOUS
  Filled 2017-06-12 (×22): qty 1

## 2017-06-12 MED ORDER — LORAZEPAM 0.5 MG PO TABS
0.5000 mg | ORAL_TABLET | Freq: Three times a day (TID) | ORAL | Status: DC | PRN
  Administered 2017-06-12 – 2017-06-16 (×11): 0.5 mg via ORAL
  Filled 2017-06-12 (×11): qty 1

## 2017-06-12 MED ORDER — IOPAMIDOL (ISOVUE-300) INJECTION 61%
15.0000 mL | INTRAVENOUS | Status: AC
  Administered 2017-06-12: 15 mL via ORAL

## 2017-06-12 MED ORDER — METRONIDAZOLE 500 MG PO TABS
500.0000 mg | ORAL_TABLET | Freq: Three times a day (TID) | ORAL | Status: DC
  Administered 2017-06-12 – 2017-06-16 (×12): 500 mg via ORAL
  Filled 2017-06-12 (×13): qty 1

## 2017-06-12 MED ORDER — NICOTINE 14 MG/24HR TD PT24
14.0000 mg | MEDICATED_PATCH | Freq: Every day | TRANSDERMAL | Status: DC
  Administered 2017-06-13: 14 mg via TRANSDERMAL
  Filled 2017-06-12 (×3): qty 1

## 2017-06-12 MED ORDER — ONDANSETRON HCL 4 MG/2ML IJ SOLN
4.0000 mg | Freq: Once | INTRAMUSCULAR | Status: AC
  Administered 2017-06-12: 4 mg via INTRAVENOUS
  Filled 2017-06-12: qty 2

## 2017-06-12 MED ORDER — ACETAMINOPHEN 325 MG PO TABS
650.0000 mg | ORAL_TABLET | Freq: Four times a day (QID) | ORAL | Status: DC | PRN
  Administered 2017-06-13: 650 mg via ORAL
  Filled 2017-06-12 (×2): qty 2

## 2017-06-12 MED ORDER — PNEUMOCOCCAL VAC POLYVALENT 25 MCG/0.5ML IJ INJ
0.5000 mL | INJECTION | INTRAMUSCULAR | Status: AC
  Administered 2017-06-15: 0.5 mL via INTRAMUSCULAR
  Filled 2017-06-12: qty 0.5

## 2017-06-12 MED ORDER — INSULIN ASPART 100 UNIT/ML ~~LOC~~ SOLN: 0.0000 [IU] | Freq: Every day | SUBCUTANEOUS | Status: DC

## 2017-06-12 MED ORDER — ACETAMINOPHEN 650 MG RE SUPP: 650.0000 mg | Freq: Four times a day (QID) | RECTAL | Status: DC | PRN

## 2017-06-12 MED ORDER — ONDANSETRON HCL 4 MG/2ML IJ SOLN
4.0000 mg | Freq: Four times a day (QID) | INTRAMUSCULAR | Status: DC | PRN
  Administered 2017-06-13 – 2017-06-16 (×9): 4 mg via INTRAVENOUS
  Filled 2017-06-12 (×10): qty 2

## 2017-06-12 MED ORDER — PANTOPRAZOLE SODIUM 40 MG PO TBEC
40.0000 mg | DELAYED_RELEASE_TABLET | Freq: Every day | ORAL | Status: DC
  Administered 2017-06-13 – 2017-06-16 (×4): 40 mg via ORAL
  Filled 2017-06-12 (×4): qty 1

## 2017-06-12 MED ORDER — IOPAMIDOL (ISOVUE-300) INJECTION 61%
75.0000 mL | Freq: Once | INTRAVENOUS | Status: AC | PRN
  Administered 2017-06-12: 75 mL via INTRAVENOUS

## 2017-06-12 MED ORDER — SODIUM CHLORIDE 0.9 % IV BOLUS (SEPSIS): 1500.0000 mL | Freq: Once | INTRAVENOUS | Status: DC

## 2017-06-12 MED ORDER — ASPIRIN EC 81 MG PO TBEC
81.0000 mg | DELAYED_RELEASE_TABLET | Freq: Every day | ORAL | Status: DC
  Administered 2017-06-13 – 2017-06-16 (×4): 81 mg via ORAL
  Filled 2017-06-12 (×4): qty 1

## 2017-06-12 MED ORDER — MORPHINE SULFATE (PF) 4 MG/ML IV SOLN
4.0000 mg | Freq: Once | INTRAVENOUS | Status: AC
  Administered 2017-06-12: 4 mg via INTRAVENOUS
  Filled 2017-06-12: qty 1

## 2017-06-12 MED ORDER — CITALOPRAM HYDROBROMIDE 20 MG PO TABS
20.0000 mg | ORAL_TABLET | Freq: Every day | ORAL | Status: DC
Start: 2017-06-13 — End: 2017-06-16
  Administered 2017-06-13 – 2017-06-16 (×4): 20 mg via ORAL
  Filled 2017-06-12 (×4): qty 1

## 2017-06-12 MED ORDER — KETOROLAC TROMETHAMINE 15 MG/ML IJ SOLN: 15.0000 mg | Freq: Four times a day (QID) | INTRAMUSCULAR | Status: DC | PRN

## 2017-06-12 NOTE — ED Notes (Signed)
Dr. Cinda Quest aware of glucose of 695.

## 2017-06-12 NOTE — H&P (Signed)
[facility] NAME: Renee Mack    MR#:  [id number]  DATE OF BIRTH:  06-17-1966  DATE OF ADMISSION:  06/12/2017  PRIMARY CARE PHYSICIAN: Sherlyn Lees, FNP   REQUESTING/REFERRING PHYSICIAN:   CHIEF COMPLAINT:   Chief Complaint  Patient presents with  . Hyperglycemia    HISTORY OF PRESENT ILLNESS: Renee Mack  is a 51 y.o. female with a known history per below presents with acute nausea/vomiting/right sided abdominal pain radiating into her back and started on yesterday, decreased p.o. intake, generalized weakness/fatigue, ER workup noted for multiple electrolyte abnormalities, CT abdomen noted for mildly thickened and inflamed loop of small bowel in the right lower quadrant suspicious for enteritis and ileus, noted hernia, glucose of 695, anion gap was normal, patient evaluated emergency room and noted discomfort secondary to pain, patient is now being admitted for acute probable infectious enteritis and acute hyperglycemia.  PAST MEDICAL HISTORY:   Past Medical History:  Diagnosis Date  . Cancer ([place])   . COPD (chronic obstructive pulmonary disease) ([place])   . Diabetes mellitus without complication ([place])   . GERD (gastroesophageal reflux disease)   . HLD (hyperlipidemia)   . Pancreatitis   . Shingles     PAST SURGICAL HISTORY:  Past Surgical History:  Procedure Laterality Date  . ABDOMINAL HYSTERECTOMY     partial  . ECTOPIC PREGNANCY SURGERY Left   . LYMPHADENECTOMY    . SPLENECTOMY, PARTIAL    . vulvulasectomy      SOCIAL HISTORY:  Social History   Tobacco Use  . Smoking status: Current Every Day Smoker    Packs/day: 1.00    Types: Cigarettes  . Smokeless tobacco: Never Used  Substance Use Topics  . Alcohol use: No    FAMILY HISTORY:  Family History  Problem Relation Age of Onset  . Cancer Mother   . Osteoporosis Sister   . Heart disease Brother     DRUG ALLERGIES:  Allergies  Allergen Reactions  .  Metformin And Related Shortness Of Breath  . Darvon [Propoxyphene] Itching  . Gabapentin Swelling  . Nsaids Other (See Comments)    Ulcers   . Tramadol Hives    REVIEW OF SYSTEMS:   CONSTITUTIONAL: + fatigue/ weakness.  EYES: No blurred or double vision.  EARS, NOSE, AND THROAT: No tinnitus or ear pain.  RESPIRATORY: No cough, shortness of breath, wheezing or hemoptysis.  CARDIOVASCULAR: No chest pain, orthopnea, edema.  GASTROINTESTINAL: + Nausea/vomiting/abdominal pain.  GENITOURINARY: No dysuria, hematuria.  ENDOCRINE: No polyuria, nocturia,  HEMATOLOGY: No anemia, easy bruising or bleeding SKIN: No rash or lesion. MUSCULOSKELETAL: No joint pain or arthritis.   NEUROLOGIC: No tingling, numbness, weakness.  PSYCHIATRY: No anxiety or depression.   MEDICATIONS AT HOME:  Prior to Admission medications   Medication Sig Start Date End Date Taking? Authorizing Provider  insulin NPH Human (HUMULIN N) 100 UNIT/ML injection Inject 25 Units into the skin 3 (three) times daily. 07/06/16 07/06/17 Yes [provider]  oxyCODONE (ROXICODONE) 15 MG immediate release tablet Take 15 mg by mouth every 4 (four) hours.   Yes [provider]  acetaminophen (TYLENOL) 325 MG tablet Take 650 mg every 6 (six) hours as needed by mouth.     [provider]  aspirin 81 MG tablet Take 81 mg by mouth daily.    [provider]  citalopram (CELEXA) 20 MG tablet Take 20 mg daily by mouth.    [provider]  esomeprazole (NEXIUM) 40 MG capsule Take 40 mg by mouth daily at 12 noon.    [provider]  insulin aspart (NOVOLOG) 100 UNIT/ML injection Inject 20 Units into the skin 2 (two) times daily at 10 am and 4 pm.    [provider]  insulin glargine (LANTUS) 100 UNIT/ML injection Inject 50 Units into the skin 2 (two) times daily.    [provider]  LORazepam (ATIVAN) 0.5 MG tablet Take 1 tablet (0.5 mg total) by mouth every 8 (eight) hours  as needed for anxiety. 02/06/17 02/06/18  Lisa Roca, MD  oxyCODONE-acetaminophen (ROXICET) 5-325 MG tablet Take 1 tablet every 6 (six) hours as needed by mouth for severe pain. 04/29/17   Darel Hong, MD      PHYSICAL EXAMINATION:   VITAL SIGNS: Blood pressure 113/67, pulse 85, temperature 98.1 F (36.7 C), temperature source Oral, resp. rate 18, height 5\' 1"  (1.549 m), weight 53.5 kg (118 lb), SpO2 95 %.  GENERAL:  51 y.o.-year-old patient lying in the bed with no acute distress.  EYES: Pupils equal, round, reactive to light and accommodation. No scleral icterus. Extraocular muscles intact.  HEENT: Head atraumatic, normocephalic. Oropharynx and nasopharynx clear.  NECK:  Supple, no jugular venous distention. No thyroid enlargement, no tenderness.  LUNGS: Normal breath sounds bilaterally, no wheezing, rales,rhonchi or crepitation. No use of accessory muscles of respiration.  CARDIOVASCULAR: S1, S2 normal. No murmurs, rubs, or gallops.  ABDOMEN: Right abdominal tenderness, nondistended, hypoactive bowel sounds   EXTREMITIES: No pedal edema, cyanosis, or clubbing.  NEUROLOGIC: Cranial nerves II through XII are intact. MAES. Gait not checked.  PSYCHIATRIC: The patient is alert and oriented x 3.  SKIN: No obvious rash, lesion, or ulcer.   LABORATORY PANEL:   CBC Recent Labs  Lab 06/12/17 1636  WBC 8.6  HGB 15.2  HCT 46.6  PLT 223  MCV 100.4*  MCH 32.7  MCHC 32.6  RDW 14.4   ------------------------------------------------------------------------------------------------------------------  Chemistries  Recent Labs  Lab 06/12/17 1636  NA 121*  K 4.8  CL 90*  CO2 19*  GLUCOSE 695*  BUN 19  CREATININE 0.65  CALCIUM 9.5  AST 43*  ALT 33  ALKPHOS 88  BILITOT 0.9   ------------------------------------------------------------------------------------------------------------------ estimated creatinine clearance is 61.4 mL/min (by C-G formula based on SCr of 0.65  mg/dL). ------------------------------------------------------------------------------------------------------------------ No results for input(s): TSH, T4TOTAL, T3FREE, THYROIDAB in the last 72 hours.  Invalid input(s): FREET3   Coagulation profile No results for input(s): INR, PROTIME in the last 168 hours. ------------------------------------------------------------------------------------------------------------------- No results for input(s): DDIMER in the last 72 hours. -------------------------------------------------------------------------------------------------------------------  Cardiac Enzymes No results for input(s): CKMB, TROPONINI, MYOGLOBIN in the last 168 hours.  Invalid input(s): CK ------------------------------------------------------------------------------------------------------------------ Invalid input(s): POCBNP  ---------------------------------------------------------------------------------------------------------------  Urinalysis    Component Value Date/Time   COLORURINE COLORLESS (A) 06/12/2017 1725   APPEARANCEUR CLEAR (A) 06/12/2017 1725   APPEARANCEUR Hazy 04/19/2014 0925   LABSPEC 1.024 06/12/2017 1725   LABSPEC 1.006 04/19/2014 0925   PHURINE 5.0 06/12/2017 1725   GLUCOSEU >=500 (A) 06/12/2017 1725   GLUCOSEU >=500 04/19/2014 0925   HGBUR NEGATIVE 06/12/2017 1725   BILIRUBINUR NEGATIVE 06/12/2017 1725   BILIRUBINUR Negative 04/19/2014 0925   KETONESUR 5 (A) 06/12/2017 1725   PROTEINUR NEGATIVE 06/12/2017 1725   NITRITE NEGATIVE 06/12/2017 1725   LEUKOCYTESUR NEGATIVE 06/12/2017 1725   LEUKOCYTESUR 3+ 04/19/2014 0925     RADIOLOGY: Ct Abdomen Pelvis W Contrast  Result Date: 06/12/2017 CLINICAL DATA:  51 year old female with abdominal distention  and nausea vomiting. EXAM: CT ABDOMEN AND PELVIS WITH CONTRAST TECHNIQUE: Multidetector CT imaging of the abdomen and pelvis was performed using the standard protocol following bolus  administration of intravenous contrast. CONTRAST:  73mL ISOVUE-300 IOPAMIDOL (ISOVUE-300) INJECTION 61% COMPARISON:  Abdominal CT dated 04/03/2017 FINDINGS: Lower chest: The visualized lung bases are clear. No intra-abdominal free air or free fluid. Hepatobiliary: No focal liver abnormality is seen. No gallstones, gallbladder wall thickening, or biliary dilatation. Pancreas: Unremarkable. No pancreatic ductal dilatation or surrounding inflammatory changes. Spleen: Normal in size without focal abnormality. Adrenals/Urinary Tract: The adrenal glands are unremarkable. There is no hydronephrosis on either side. The visualized ureters and urinary bladder appear unremarkable. Stomach/Bowel: There is thickening of a loop of small bowel in the right lower quadrant. This segment of bowel measures approximately 2.5 cm in diameter. There is slight fecalization of the small bowel loop just distal to the thickened segment. Findings may represent a segmental enteritis with associated ileus. However, a low-grade or early small-bowel obstruction although less likely is not entirely excluded. Clinical correlation is recommended. There is focal protrusion of anterior wall of the transverse colon through a small supraumbilical hernia without evidence of obstruction or inflammation. The appendix is normal. Vascular/Lymphatic: There is advanced calcified and noncalcified plaque along the course of the aorta. The IVC is unremarkable. No portal venous gas. No adenopathy. Reproductive: Hysterectomy.  No pelvic mass. Other: None Musculoskeletal: No acute osseous pathology. IMPRESSION: 1. Mildly thickened and inflamed loop of small bowel in the right lower quadrant may represent segmental enteritis and ileus. An early or low grade small bowel obstruction is less likely but not entirely excluded. Clinical correlation is recommended. 2. Focal protrusion of anterior wall of transverse colon into a few small supraumbilical hernia without  evidence of obstruction or inflammation at this site. 3.  Aortic Atherosclerosis (ICD10-I70.0). Electronically Signed   By: Anner Crete M.D.   On: 06/12/2017 19:55    EKG: Orders placed or performed during the hospital encounter of 02/06/17  . ED EKG within 10 minutes  . ED EKG within 10 minutes    IMPRESSION AND PLAN: 1 acute probable infectious enteritis Admit to regular nursing floor bed, empiric Cipro/Flagyl for 5-7-day course, IV fluids for rehydration, n.p.o. for now, and continue close medical monitoring  2 acute hyperglycemia with insulin-dependent diabetes mellitus Anion gap is normal Most likely exacerbated by above IV fluids for rehydration, n.p.o. for now, reduce long-acting insulin twice daily, sliding scale insulin with Accu-Cheks every 4 hours, check hemoglobin A1c to determine level control, continue close medical monitoring  3 acute pseudohyponatremia, hypochloremia IV fluids for rehydration and check BMP in the morning  4 chronic tobacco smoking abuse/dependency Nicotine patch cessation counseling ordered  Full code Condition stable Prognosis fair DVT prophylaxis with Lovenox subcu Disposition home in 3-4 days pending clinical course   All the records are reviewed and case discussed with ED provider. Management plans discussed with the patient, family and they are in agreement.  CODE STATUS: Code Status History    Date Active Date Inactive Code Status Order ID Comments User Context   12/28/2014 03:05 12/28/2014 19:19 Full Code [id number]  Lance Coon, MD Inpatient       TOTAL TIME TAKING CARE OF THIS PATIENT: 45 minutes.    Avel Peace  M.D on 06/12/2017   Between 7am to 6pm - Pager - [phone]  After 6pm go to [email or web address] - password [email or web address] [facility]  Office  [phone]  CC: Primary  care physician; Sherlyn Lees, FNP   Note: This dictation was prepared with Dragon dictation along with smaller phrase  technology. Any transcriptional errors that result from this process are unintentional.

## 2017-06-12 NOTE — ED Notes (Signed)
Patient transported to 203

## 2017-06-12 NOTE — Progress Notes (Signed)
Pt requesting her ativan, Md notified, orders taken

## 2017-06-12 NOTE — ED Notes (Signed)
First nurse note  Presents with n/v and high blood sugar  Sxs; started yesterday

## 2017-06-12 NOTE — ED Triage Notes (Signed)
Pt to ED via POV c/o hyperglycemia, pt states that she has not missed any of her medications. Pt has been having abdominal pain that radiates into her back, N/V since last night. Pt has hx/o DKA. Pt states that her meter has been reading "high". Pt in NAD at this time.

## 2017-06-12 NOTE — ED Provider Notes (Signed)
[facility] Emergency Department Provider Note   ____________________________________________   First MD Initiated Contact with Patient 06/12/17 1713     (approximate)  I have reviewed the triage vital signs and the nursing notes.   HISTORY  Chief Complaint Hyperglycemia    HPI Renee Mack is a 51 y.o. female Patient with history of diabetes and pancreatitis reports she began having vomiting yesterday and then upper abdominal pain radiating through to her back. Her sugar has been reading high since yesterday at home. She came in here today. Pain is moderately severe achy radiating through the back   Past Medical History:  Diagnosis Date  . Cancer ([place])   . COPD (chronic obstructive pulmonary disease) ([place])   . Diabetes mellitus without complication ([place])   . GERD (gastroesophageal reflux disease)   . HLD (hyperlipidemia)   . Pancreatitis   . Shingles     Patient Active Problem List   Diagnosis Date Noted  . Pyelonephritis 12/28/2014  . Type 2 diabetes mellitus ([place]) 12/28/2014  . COPD (chronic obstructive pulmonary disease) ([place]) 12/28/2014  . GERD (gastroesophageal reflux disease) 12/28/2014  . History of shingles 12/28/2014  . HLD (hyperlipidemia) 12/28/2014    Past Surgical History:  Procedure Laterality Date  . ABDOMINAL HYSTERECTOMY     partial  . ECTOPIC PREGNANCY SURGERY Left   . LYMPHADENECTOMY    . SPLENECTOMY, PARTIAL    . vulvulasectomy      Prior to Admission medications   Medication Sig Start Date End Date Taking? Authorizing Provider  acetaminophen (TYLENOL) 325 MG tablet Take 650 mg every 6 (six) hours as needed by mouth.     [provider]  amoxicillin (AMOXIL) 875 MG tablet Take 1 tablet (875 mg total) by mouth 2 (two) times daily. X 10 days Patient not taking: Reported on 04/03/2017 03/01/17   Duanne Guess, PA-C  aspirin 81 MG tablet Take 81 mg by mouth daily.    [provider]  citalopram  (CELEXA) 20 MG tablet Take 20 mg daily by mouth.    [provider]  esomeprazole (NEXIUM) 40 MG capsule Take 40 mg by mouth daily at 12 noon.    [provider]  HYDROcodone-acetaminophen (NORCO) 5-325 MG tablet Take 1 tablet by mouth every 6 (six) hours as needed for moderate pain. Patient not taking: Reported on 02/06/2017 05/02/16   Daymon Larsen, MD  insulin aspart (NOVOLOG) 100 UNIT/ML injection Inject 20 Units into the skin 2 (two) times daily at 10 am and 4 pm.    [provider]  insulin glargine (LANTUS) 100 UNIT/ML injection Inject 50 Units into the skin 2 (two) times daily.    [provider]  lidocaine (XYLOCAINE) 2 % solution Use as directed 20 mLs in the mouth or throat as needed for mouth pain. Patient not taking: Reported on 02/06/2017 01/28/15   Beers, Pierce Crane, PA-C  LORazepam (ATIVAN) 0.5 MG tablet Take 1 tablet (0.5 mg total) by mouth every 8 (eight) hours as needed for anxiety. 02/06/17 02/06/18  Lisa Roca, MD  ondansetron (ZOFRAN ODT) 4 MG disintegrating tablet Take 1 tablet (4 mg total) by mouth every 8 (eight) hours as needed for nausea or vomiting. Patient not taking: Reported on 04/03/2017 02/16/16   Joanne Gavel, MD  oxyCODONE-acetaminophen (ROXICET) 5-325 MG tablet Take 1 tablet every 6 (six) hours as needed by mouth for severe pain. 04/29/17   Darel Hong, MD  polyethylene glycol powder (GLYCOLAX/MIRALAX) powder Dissolve 1  tablespoon in 4-8 ounces of juice or water and consume 1-2 times daily until you're having a soft bowel movement every day. After that, reduce to just once daily. Patient not taking: Reported on 02/06/2017 02/16/16   Joanne Gavel, MD    Allergies Metformin and related; Darvon [propoxyphene]; Gabapentin; Nsaids; and Tramadol  Family History  Problem Relation Age of Onset  . Cancer Mother   . Osteoporosis Sister   . Heart disease Brother     Social History Social History   Tobacco Use  . Smoking  status: Current Every Day Smoker    Packs/day: 1.00    Types: Cigarettes  . Smokeless tobacco: Never Used  Substance Use Topics  . Alcohol use: No  . Drug use: No    Review of Systems  Constitutional: No fever/chills Eyes: No visual changes. ENT: No sore throat. Cardiovascular: Denies chest pain. Respiratory: Denies shortness of breath. Gastrointestinal: see history of present illness Genitourinary: Negative for dysuria. Musculoskeletal: Negative for back pain. Skin: Negative for rash. Neurological: Negative for headaches, focal weakness  ____________________________________________   PHYSICAL EXAM:  VITAL SIGNS: ED Triage Vitals  Enc Vitals Group     BP 06/12/17 1634 (!) 158/88     Pulse Rate 06/12/17 1634 95     Resp 06/12/17 1634 16     Temp 06/12/17 1634 98.1 F (36.7 C)     Temp Source 06/12/17 1634 Oral     SpO2 06/12/17 1634 99 %     Weight 06/12/17 1634 118 lb (53.5 kg)     Height 06/12/17 1634 5\' 1"  (1.549 m)     Head Circumference --      Peak Flow --      Pain Score 06/12/17 1633 9     Pain Loc --      Pain Edu? --      Excl. in [place]? --     Constitutional: Alert and oriented. Well appearing and in moderate acute distress. Eyes: Conjunctivae are normal.  Head: Atraumatic. Nose: No congestion/rhinnorhea. Mouth/Throat: Mucous membranes are moist.  Oropharynx non-erythematous. Neck: No stridor. Cardiovascular: Normal rate, regular rhythm. Grossly normal heart sounds.  Good peripheral circulation. Respiratory: Normal respiratory effort.  No retractions. Lungs CTAB. Gastrointestinal: Soft tender in the epigastric area down to the umbilicus tender palpation and percussion No distention. No abdominal bruits. No CVA tenderness. Musculoskeletal: No lower extremity tenderness nor edema.  No joint effusions. Neurologic:  Normal speech and language. No gross focal neurologic deficits are appreciated. No gait instability. Skin:  Skin is warm, dry and intact. No  rash noted. Psychiatric: Mood and affect are normal. Speech and behavior are normal.  ____________________________________________   LABS (all labs ordered are listed, but only abnormal results are displayed)  Labs Reviewed  BASIC METABOLIC PANEL - Abnormal; Notable for the following components:      Result Value   Sodium 121 (*)    Chloride 90 (*)    CO2 19 (*)    Glucose, Bld 695 (*)    All other components within normal limits  CBC - Abnormal; Notable for the following components:   MCV 100.4 (*)    All other components within normal limits  URINALYSIS, COMPLETE (UACMP) WITH MICROSCOPIC - Abnormal; Notable for the following components:   Color, Urine COLORLESS (*)    APPearance CLEAR (*)    Glucose, UA >=500 (*)    Ketones, ur 5 (*)    Bacteria, UA RARE (*)    Squamous Epithelial /  LPF 0-5 (*)    All other components within normal limits  GLUCOSE, CAPILLARY - Abnormal; Notable for the following components:   Glucose-Capillary >600 (*)    All other components within normal limits  HEPATIC FUNCTION PANEL - Abnormal; Notable for the following components:   AST 43 (*)    All other components within normal limits  GLUCOSE, CAPILLARY - Abnormal; Notable for the following components:   Glucose-Capillary 543 (*)    All other components within normal limits  GLUCOSE, CAPILLARY - Abnormal; Notable for the following components:   Glucose-Capillary 377 (*)    All other components within normal limits  LIPASE, BLOOD  BLOOD GAS, VENOUS  CBG MONITORING, ED  CBG MONITORING, ED  CBG MONITORING, ED  CBG MONITORING, ED   ____________________________________________  EKG   ____________________________________________  RADIOLOGY  patient has an inflamed loop of small bowel which may represent enteritis or possibly an obstruction. This appears to account for her pain and not the pancreatitis I was expecting.    ________________________________   PROCEDURES  Procedure(s)  performed:  Procedures  Critical Care performed:   ____________________________________________   INITIAL IMPRESSION / ASSESSMENT AND PLAN / ED COURSE patient has focal enteritis of the small bowel severe abdominal pain nausea and hyperglycemia. I think the safest thing to do with this patient would be to admit her for management of her hyperglycemia and the pain and enteritis.   Clinical Course as of Jun 12 2046  Sun Jun 12, 2017  1713 MCV: (!) 100.4 [PM]    Clinical Course User Index [PM] Nena Polio, MD     ____________________________________________   FINAL CLINICAL IMPRESSION(S) / ED DIAGNOSES  Final diagnoses:  Hyperglycemia  Pain of upper abdomen     ED Discharge Orders    None       Note:  This document was prepared using Dragon voice recognition software and may include unintentional dictation errors.    Nena Polio, MD 06/12/17 [phone]

## 2017-06-12 NOTE — ED Notes (Addendum)
Pt using call bell; c/o pain and nausea; Verbal orders received from Dr Cinda Quest for pain and nausea

## 2017-06-13 LAB — CBC
HEMATOCRIT: 41.8 % (ref 35.0–47.0)
HEMOGLOBIN: 14.5 g/dL (ref 12.0–16.0)
MCH: 33.7 pg (ref 26.0–34.0)
MCHC: 34.7 g/dL (ref 32.0–36.0)
MCV: 97.2 fL (ref 80.0–100.0)
Platelets: 188 10*3/uL (ref 150–440)
RBC: 4.3 MIL/uL (ref 3.80–5.20)
RDW: 14.1 % (ref 11.5–14.5)
WBC: 8.6 10*3/uL (ref 3.6–11.0)

## 2017-06-13 LAB — BASIC METABOLIC PANEL
Anion gap: 7 (ref 5–15)
BUN: 10 mg/dL (ref 6–20)
CALCIUM: 8.4 mg/dL — AB (ref 8.9–10.3)
CO2: 24 mmol/L (ref 22–32)
CREATININE: 0.31 mg/dL — AB (ref 0.44–1.00)
Chloride: 102 mmol/L (ref 101–111)
GLUCOSE: 198 mg/dL — AB (ref 65–99)
Potassium: 3.2 mmol/L — ABNORMAL LOW (ref 3.5–5.1)
Sodium: 133 mmol/L — ABNORMAL LOW (ref 135–145)

## 2017-06-13 LAB — HEMOGLOBIN A1C
Hgb A1c MFr Bld: 15.8 % — ABNORMAL HIGH (ref 4.8–5.6)
MEAN PLASMA GLUCOSE: 406.76 mg/dL

## 2017-06-13 LAB — GLUCOSE, CAPILLARY
GLUCOSE-CAPILLARY: 209 mg/dL — AB (ref 65–99)
GLUCOSE-CAPILLARY: 222 mg/dL — AB (ref 65–99)
Glucose-Capillary: 172 mg/dL — ABNORMAL HIGH (ref 65–99)
Glucose-Capillary: 210 mg/dL — ABNORMAL HIGH (ref 65–99)
Glucose-Capillary: 159 mg/dL — ABNORMAL HIGH (ref 65–99)
Glucose-Capillary: 203 mg/dL — ABNORMAL HIGH (ref 65–99)

## 2017-06-13 LAB — MAGNESIUM: MAGNESIUM: 1.8 mg/dL (ref 1.7–2.4)

## 2017-06-13 MED ORDER — POTASSIUM CHLORIDE CRYS ER 20 MEQ PO TBCR
40.0000 meq | EXTENDED_RELEASE_TABLET | Freq: Once | ORAL | Status: AC
  Administered 2017-06-13: 40 meq via ORAL
  Filled 2017-06-13: qty 2

## 2017-06-13 MED ORDER — INSULIN ASPART 100 UNIT/ML ~~LOC~~ SOLN
0.0000 [IU] | Freq: Three times a day (TID) | SUBCUTANEOUS | Status: DC
  Administered 2017-06-13 (×2): 5 [IU] via SUBCUTANEOUS
  Administered 2017-06-13: 3 [IU] via SUBCUTANEOUS
  Administered 2017-06-14 – 2017-06-15 (×4): 5 [IU] via SUBCUTANEOUS
  Administered 2017-06-15 (×3): 3 [IU] via SUBCUTANEOUS
  Administered 2017-06-16: 8 [IU] via SUBCUTANEOUS
  Filled 2017-06-13 (×11): qty 1

## 2017-06-13 NOTE — Progress Notes (Signed)
[place] [facility]                                                                                                                                                                                  Patient Demographics   Renee Mack, is a 51 y.o. female, DOB - 27-Dec-1965, PXT:[id number]  Admit date - 06/12/2017   Admitting Physician Gorden Harms, MD  Outpatient Primary MD for the patient is Sherlyn Lees, FNP   LOS - 1  Subjective: Patient admitted with nausea vomiting abdominal pain states that her symptoms have somewhat improved.  No further nausea or vomiting W.  Persist    Review of Systems:   CONSTITUTIONAL: No documented fever. No fatigue, weakness. No weight gain, no weight loss.  EYES: No blurry or double vision.  ENT: No tinnitus. No postnasal drip. No redness of the oropharynx.  RESPIRATORY: No cough, no wheeze, no hemoptysis. No dyspnea.  CARDIOVASCULAR: No chest pain. No orthopnea. No palpitations. No syncope.  GASTROINTESTINAL: No nausea, no vomiting or diarrhea. No abdominal pain. No melena or hematochezia.  GENITOURINARY: No dysuria or hematuria.  ENDOCRINE: No polyuria or nocturia. No heat or cold intolerance.  HEMATOLOGY: No anemia. No bruising. No bleeding.  INTEGUMENTARY: No rashes. No lesions.  MUSCULOSKELETAL: No arthritis. No swelling. No gout.  NEUROLOGIC: No numbness, tingling, or ataxia. No seizure-type activity.  PSYCHIATRIC: No anxiety. No insomnia. No ADD.    Vitals:   Vitals:   06/12/17 2220 06/12/17 2223 06/13/17 0354 06/13/17 1317  BP:  (!) 106/58 98/67 107/63  Pulse:  74 71 71  Resp:  (!) 24 20 18   Temp:  98.1 F (36.7 C) (!) 97.4 F (36.3 C) 98.2 F (36.8 C)  TempSrc:  Oral Oral Oral  SpO2:  95% 96% 96%  Weight: 133 lb (60.3 kg)     Height: 5\' 1"  (1.549 m)       Wt Readings from Last 3 Encounters:  06/12/17 133 lb (60.3 kg)  04/29/17 118 lb (53.5 kg)  04/03/17 118 lb (53.5 kg)      Intake/Output Summary (Last 24 hours) at 06/13/2017 1343 Last data filed at 06/13/2017 1330 Gross per 24 hour  Intake 3264 ml  Output 1850 ml  Net 1414 ml    Physical Exam:   GENERAL: Pleasant-appearing in no apparent distress.  HEAD, EYES, EARS, NOSE AND THROAT: Atraumatic, normocephalic. Extraocular muscles are intact. Pupils equal and reactive to light. Sclerae anicteric. No conjunctival injection. No oro-pharyngeal erythema.  NECK: Supple. There is no jugular venous distention. No bruits, no lymphadenopathy, no thyromegaly.  HEART: Regular rate and rhythm,. No murmurs, no rubs, no clicks.  LUNGS: Clear to auscultation bilaterally. No rales or rhonchi. No wheezes.  ABDOMEN: Soft, flat, mild epigastric tenderness nondistended. Has good bowel sounds. No hepatosplenomegaly appreciated.  EXTREMITIES: No evidence of any cyanosis, clubbing, or peripheral edema.  +2 pedal and radial pulses bilaterally.  NEUROLOGIC: The patient is alert, awake, and oriented x3 with no focal motor or sensory deficits appreciated bilaterally.  SKIN: Moist and warm with no rashes appreciated.  Psych: Not anxious, depressed LN: No inguinal LN enlargement    Antibiotics   Anti-infectives (From admission, onward)   Start     Dose/Rate Route Frequency Ordered Stop   06/12/17 2200  metroNIDAZOLE (FLAGYL) tablet 500 mg     500 mg Oral Every 8 hours 06/12/17 2135     06/12/17 2145  ciprofloxacin (CIPRO) IVPB 400 mg     400 mg 200 mL/hr over 60 Minutes Intravenous Every 12 hours 06/12/17 2135        Medications   Scheduled Meds: . aspirin EC  81 mg Oral Daily  . citalopram  20 mg Oral Daily  . enoxaparin (LOVENOX) injection  40 mg Subcutaneous Q24H  . Influenza vac split quadrivalent PF  0.5 mL Intramuscular Tomorrow-1000  . insulin aspart  0-15 Units Subcutaneous TID PC & HS  . insulin aspart  0-5 Units Subcutaneous QHS  . insulin glargine  25 Units Subcutaneous BID  . metroNIDAZOLE  500 mg Oral  Q8H  . nicotine  14 mg Transdermal Daily  . pantoprazole  40 mg Oral Daily  . pneumococcal 23 valent vaccine  0.5 mL Intramuscular Tomorrow-1000   Continuous Infusions: . sodium chloride 125 mL/hr at 06/13/17 1220  . ciprofloxacin Stopped (06/13/17 1220)   PRN Meds:.acetaminophen **OR** acetaminophen, HYDROcodone-acetaminophen, ketorolac, LORazepam, morphine injection, ondansetron **OR** ondansetron (ZOFRAN) IV, oxyCODONE-acetaminophen   Data Review:   Micro Results No results found for this or any previous visit (from the past 240 hour(s)).  Radiology Reports Ct Abdomen Pelvis W Contrast  Result Date: 06/12/2017 CLINICAL DATA:  51 year old female with abdominal distention and nausea vomiting. EXAM: CT ABDOMEN AND PELVIS WITH CONTRAST TECHNIQUE: Multidetector CT imaging of the abdomen and pelvis was performed using the standard protocol following bolus administration of intravenous contrast. CONTRAST:  46mL ISOVUE-300 IOPAMIDOL (ISOVUE-300) INJECTION 61% COMPARISON:  Abdominal CT dated 04/03/2017 FINDINGS: Lower chest: The visualized lung bases are clear. No intra-abdominal free air or free fluid. Hepatobiliary: No focal liver abnormality is seen. No gallstones, gallbladder wall thickening, or biliary dilatation. Pancreas: Unremarkable. No pancreatic ductal dilatation or surrounding inflammatory changes. Spleen: Normal in size without focal abnormality. Adrenals/Urinary Tract: The adrenal glands are unremarkable. There is no hydronephrosis on either side. The visualized ureters and urinary bladder appear unremarkable. Stomach/Bowel: There is thickening of a loop of small bowel in the right lower quadrant. This segment of bowel measures approximately 2.5 cm in diameter. There is slight fecalization of the small bowel loop just distal to the thickened segment. Findings may represent a segmental enteritis with associated ileus. However, a low-grade or early small-bowel obstruction although less  likely is not entirely excluded. Clinical correlation is recommended. There is focal protrusion of anterior wall of the transverse colon through a small supraumbilical hernia without evidence of obstruction or inflammation. The appendix is normal. Vascular/Lymphatic: There is advanced calcified and noncalcified plaque along the course of the aorta. The IVC is unremarkable. No portal venous gas. No adenopathy. Reproductive: Hysterectomy.  No pelvic mass. Other: None Musculoskeletal: No acute osseous pathology. IMPRESSION: 1. Mildly thickened and inflamed  loop of small bowel in the right lower quadrant may represent segmental enteritis and ileus. An early or low grade small bowel obstruction is less likely but not entirely excluded. Clinical correlation is recommended. 2. Focal protrusion of anterior wall of transverse colon into a few small supraumbilical hernia without evidence of obstruction or inflammation at this site. 3.  Aortic Atherosclerosis (ICD10-I70.0). Electronically Signed   By: Anner Crete M.D.   On: 06/12/2017 19:55     CBC Recent Labs  Lab 06/12/17 1636 06/13/17 0313  WBC 8.6 8.6  HGB 15.2 14.5  HCT 46.6 41.8  PLT 223 188  MCV 100.4* 97.2  MCH 32.7 33.7  MCHC 32.6 34.7  RDW 14.4 14.1    Chemistries  Recent Labs  Lab 06/12/17 1636 06/13/17 0313  NA 121* 133*  K 4.8 3.2*  CL 90* 102  CO2 19* 24  GLUCOSE 695* 198*  BUN 19 10  CREATININE 0.65 0.31*  CALCIUM 9.5 8.4*  MG  --  1.8  AST 43*  --   ALT 33  --   ALKPHOS 88  --   BILITOT 0.9  --    ------------------------------------------------------------------------------------------------------------------ estimated creatinine clearance is 67.8 mL/min (A) (by C-G formula based on SCr of 0.31 mg/dL (L)). ------------------------------------------------------------------------------------------------------------------ Recent Labs    06/12/17 1537  HGBA1C 15.8*    ------------------------------------------------------------------------------------------------------------------ No results for input(s): CHOL, HDL, LDLCALC, TRIG, CHOLHDL, LDLDIRECT in the last 72 hours. ------------------------------------------------------------------------------------------------------------------ No results for input(s): TSH, T4TOTAL, T3FREE, THYROIDAB in the last 72 hours.  Invalid input(s): FREET3 ------------------------------------------------------------------------------------------------------------------ No results for input(s): VITAMINB12, FOLATE, FERRITIN, TIBC, IRON, RETICCTPCT in the last 72 hours.  Coagulation profile No results for input(s): INR, PROTIME in the last 168 hours.  No results for input(s): DDIMER in the last 72 hours.  Cardiac Enzymes No results for input(s): CKMB, TROPONINI, MYOGLOBIN in the last 168 hours.  Invalid input(s): CK ------------------------------------------------------------------------------------------------------------------ Invalid input(s): [place]   1.  Acute enteritis Continue IV fluids Continue Cipro and Flagyl If no improvement GI consult Start clear liquid diet  2 acute hyperglycemia with insulin-dependent diabetes mellitus Will change patient's sliding scale to q. before meals nightly Continue Lantus Hemoglobin A1c greater than 14 poor blood sugar control   3 acute pseudohyponatremia, hypochloremia Improved with IV hydration   4 chronic tobacco smoking abuse/dependency Patient counseled regarding smoking cessation nicotine patch started strongly recommend she stop smoking, 4 minutes spent        Code Status Orders  (From admission, onward)        Start     Ordered   06/12/17 2227  Full code  Continuous     06/12/17 2226    Code Status History    Date Active Date Inactive Code Status Order ID Comments User Context   12/28/2014 03:05 12/28/2014 19:19 Full Code  [id number]  Lance Coon, MD Inpatient            Consults  none  DVT Prophylaxis  Lovenox    Lab Results  Component Value Date   PLT 188 06/13/2017     Time Spent in minutes 53min  Greater than 50% of time spent in care coordination and counseling patient regarding the condition and plan of care.   Dustin Flock M.D on 06/13/2017 at 1:43 PM  Between 7am to 6pm - Pager - [phone]  After 6pm go to [email or web address] - password [email or web address] [place] [facility]   Office  [phone]

## 2017-06-13 NOTE — Plan of Care (Signed)
Pt is progressing. Pt has had one episode of N/V. Pt on clear liquid diet now

## 2017-06-14 LAB — CBC
HEMATOCRIT: 40.6 % (ref 35.0–47.0)
Hemoglobin: 13.8 g/dL (ref 12.0–16.0)
MCH: 33.1 pg (ref 26.0–34.0)
MCHC: 33.9 g/dL (ref 32.0–36.0)
MCV: 97.6 fL (ref 80.0–100.0)
Platelets: 176 10*3/uL (ref 150–440)
RBC: 4.16 MIL/uL (ref 3.80–5.20)
RDW: 14.2 % (ref 11.5–14.5)
WBC: 8.7 10*3/uL (ref 3.6–11.0)

## 2017-06-14 LAB — GLUCOSE, CAPILLARY
GLUCOSE-CAPILLARY: 214 mg/dL — AB (ref 65–99)
GLUCOSE-CAPILLARY: 211 mg/dL — AB (ref 65–99)
Glucose-Capillary: 88 mg/dL (ref 65–99)
Glucose-Capillary: 233 mg/dL — ABNORMAL HIGH (ref 65–99)

## 2017-06-14 LAB — BASIC METABOLIC PANEL
ANION GAP: 5 (ref 5–15)
BUN: 5 mg/dL — ABNORMAL LOW (ref 6–20)
CHLORIDE: 106 mmol/L (ref 101–111)
CO2: 24 mmol/L (ref 22–32)
Calcium: 8.3 mg/dL — ABNORMAL LOW (ref 8.9–10.3)
Creatinine, Ser: <0.3 mg/dL — ABNORMAL LOW (ref 0.44–1.00)
GLUCOSE: 202 mg/dL — AB (ref 65–99)
POTASSIUM: 3.3 mmol/L — AB (ref 3.5–5.1)
Sodium: 135 mmol/L (ref 135–145)

## 2017-06-14 LAB — HIV ANTIBODY (ROUTINE TESTING W REFLEX): HIV Screen 4th Generation wRfx: NONREACTIVE

## 2017-06-14 MED ORDER — POTASSIUM CHLORIDE CRYS ER 20 MEQ PO TBCR
20.0000 meq | EXTENDED_RELEASE_TABLET | Freq: Once | ORAL | Status: AC
  Administered 2017-06-14: 20 meq via ORAL

## 2017-06-14 MED ORDER — INSULIN GLARGINE 100 UNIT/ML ~~LOC~~ SOLN
27.0000 [IU] | Freq: Two times a day (BID) | SUBCUTANEOUS | Status: DC
  Administered 2017-06-14 – 2017-06-16 (×4): 27 [IU] via SUBCUTANEOUS
  Filled 2017-06-14 (×5): qty 0.27

## 2017-06-14 NOTE — Progress Notes (Signed)
[place] at [facility]                                                                                                                                                                                  Patient Demographics   Renee Mack, is a 51 y.o. female, DOB - 1966/01/24, BOF:[id number]  Admit date - 06/12/2017   Admitting Physician Gorden Harms, MD  Outpatient Primary MD for the patient is Sherlyn Lees, FNP   LOS - 2  Subjective: Pt with abdominal pain however is much improved     Review of Systems:   CONSTITUTIONAL: No documented fever. No fatigue, weakness. No weight gain, no weight loss.  EYES: No blurry or double vision.  ENT: No tinnitus. No postnasal drip. No redness of the oropharynx.  RESPIRATORY: No cough, no wheeze, no hemoptysis. No dyspnea.  CARDIOVASCULAR: No chest pain. No orthopnea. No palpitations. No syncope.  GASTROINTESTINAL: No nausea, no vomiting or diarrhea.  Positive abdominal pain. No melena or hematochezia.  GENITOURINARY: No dysuria or hematuria.  ENDOCRINE: No polyuria or nocturia. No heat or cold intolerance.  HEMATOLOGY: No anemia. No bruising. No bleeding.  INTEGUMENTARY: No rashes. No lesions.  MUSCULOSKELETAL: No arthritis. No swelling. No gout.  NEUROLOGIC: No numbness, tingling, or ataxia. No seizure-type activity.  PSYCHIATRIC: No anxiety. No insomnia. No ADD.    Vitals:   Vitals:   06/13/17 0354 06/13/17 1317 06/13/17 2008 06/14/17 0528  BP: 98/67 107/63 133/69 118/77  Pulse: 71 71 81 86  Resp: 20 18 (!) 24 (!) 24  Temp: (!) 97.4 F (36.3 C) 98.2 F (36.8 C) (!) 97.5 F (36.4 C) 98.1 F (36.7 C)  TempSrc: Oral Oral Oral Oral  SpO2: 96% 96% 98% 98%  Weight:      Height:        Wt Readings from Last 3 Encounters:  06/12/17 133 lb (60.3 kg)  04/29/17 118 lb (53.5 kg)  04/03/17 118 lb (53.5 kg)     Intake/Output Summary (Last 24 hours) at 06/14/2017 1243 Last data filed at 06/14/2017  7782 Gross per 24 hour  Intake 3814 ml  Output 2200 ml  Net 1614 ml    Physical Exam:   GENERAL: Pleasant-appearing in no apparent distress.  HEAD, EYES, EARS, NOSE AND THROAT: Atraumatic, normocephalic. Extraocular muscles are intact. Pupils equal and reactive to light. Sclerae anicteric. No conjunctival injection. No oro-pharyngeal erythema.  NECK: Supple. There is no jugular venous distention. No bruits, no lymphadenopathy, no thyromegaly.  HEART: Regular rate and rhythm,. No murmurs, no rubs, no clicks.  LUNGS: Clear to auscultation bilaterally. No rales or rhonchi. No wheezes.  ABDOMEN: Soft, flat,  mild epigastric tenderness nondistended. Has good bowel sounds. No hepatosplenomegaly appreciated.  EXTREMITIES: No evidence of any cyanosis, clubbing, or peripheral edema.  +2 pedal and radial pulses bilaterally.  NEUROLOGIC: The patient is alert, awake, and oriented x3 with no focal motor or sensory deficits appreciated bilaterally.  SKIN: Moist and warm with no rashes appreciated.  Psych: Not anxious, depressed LN: No inguinal LN enlargement    Antibiotics   Anti-infectives (From admission, onward)   Start     Dose/Rate Route Frequency Ordered Stop   06/12/17 2200  metroNIDAZOLE (FLAGYL) tablet 500 mg     500 mg Oral Every 8 hours 06/12/17 2135     06/12/17 2145  ciprofloxacin (CIPRO) IVPB 400 mg     400 mg 200 mL/hr over 60 Minutes Intravenous Every 12 hours 06/12/17 2135        Medications   Scheduled Meds: . aspirin EC  81 mg Oral Daily  . citalopram  20 mg Oral Daily  . enoxaparin (LOVENOX) injection  40 mg Subcutaneous Q24H  . Influenza vac split quadrivalent PF  0.5 mL Intramuscular Tomorrow-1000  . insulin aspart  0-15 Units Subcutaneous TID PC & HS  . insulin glargine  25 Units Subcutaneous BID  . metroNIDAZOLE  500 mg Oral Q8H  . nicotine  14 mg Transdermal Daily  . pantoprazole  40 mg Oral Daily  . pneumococcal 23 valent vaccine  0.5 mL Intramuscular  Tomorrow-1000   Continuous Infusions: . ciprofloxacin Stopped (06/14/17 1031)   PRN Meds:.acetaminophen **OR** acetaminophen, HYDROcodone-acetaminophen, ketorolac, LORazepam, morphine injection, ondansetron **OR** ondansetron (ZOFRAN) IV, oxyCODONE-acetaminophen   Data Review:   Micro Results No results found for this or any previous visit (from the past 240 hour(s)).  Radiology Reports Ct Abdomen Pelvis W Contrast  Result Date: 06/12/2017 CLINICAL DATA:  51 year old female with abdominal distention and nausea vomiting. EXAM: CT ABDOMEN AND PELVIS WITH CONTRAST TECHNIQUE: Multidetector CT imaging of the abdomen and pelvis was performed using the standard protocol following bolus administration of intravenous contrast. CONTRAST:  22mL ISOVUE-300 IOPAMIDOL (ISOVUE-300) INJECTION 61% COMPARISON:  Abdominal CT dated 04/03/2017 FINDINGS: Lower chest: The visualized lung bases are clear. No intra-abdominal free air or free fluid. Hepatobiliary: No focal liver abnormality is seen. No gallstones, gallbladder wall thickening, or biliary dilatation. Pancreas: Unremarkable. No pancreatic ductal dilatation or surrounding inflammatory changes. Spleen: Normal in size without focal abnormality. Adrenals/Urinary Tract: The adrenal glands are unremarkable. There is no hydronephrosis on either side. The visualized ureters and urinary bladder appear unremarkable. Stomach/Bowel: There is thickening of a loop of small bowel in the right lower quadrant. This segment of bowel measures approximately 2.5 cm in diameter. There is slight fecalization of the small bowel loop just distal to the thickened segment. Findings may represent a segmental enteritis with associated ileus. However, a low-grade or early small-bowel obstruction although less likely is not entirely excluded. Clinical correlation is recommended. There is focal protrusion of anterior wall of the transverse colon through a small supraumbilical hernia without  evidence of obstruction or inflammation. The appendix is normal. Vascular/Lymphatic: There is advanced calcified and noncalcified plaque along the course of the aorta. The IVC is unremarkable. No portal venous gas. No adenopathy. Reproductive: Hysterectomy.  No pelvic mass. Other: None Musculoskeletal: No acute osseous pathology. IMPRESSION: 1. Mildly thickened and inflamed loop of small bowel in the right lower quadrant may represent segmental enteritis and ileus. An early or low grade small bowel obstruction is less likely but not entirely excluded. Clinical correlation is  recommended. 2. Focal protrusion of anterior wall of transverse colon into a few small supraumbilical hernia without evidence of obstruction or inflammation at this site. 3.  Aortic Atherosclerosis (ICD10-I70.0). Electronically Signed   By: Anner Crete M.D.   On: 06/12/2017 19:55     CBC Recent Labs  Lab 06/12/17 1636 06/13/17 0313 06/14/17 0336  WBC 8.6 8.6 8.7  HGB 15.2 14.5 13.8  HCT 46.6 41.8 40.6  PLT 223 188 176  MCV 100.4* 97.2 97.6  MCH 32.7 33.7 33.1  MCHC 32.6 34.7 33.9  RDW 14.4 14.1 14.2    Chemistries  Recent Labs  Lab 06/12/17 1636 06/13/17 0313 06/14/17 0336  NA 121* 133* 135  K 4.8 3.2* 3.3*  CL 90* 102 106  CO2 19* 24 24  GLUCOSE 695* 198* 202*  BUN 19 10 5*  CREATININE 0.65 0.31* <0.30*  CALCIUM 9.5 8.4* 8.3*  MG  --  1.8  --   AST 43*  --   --   ALT 33  --   --   ALKPHOS 88  --   --   BILITOT 0.9  --   --    ------------------------------------------------------------------------------------------------------------------ CrCl cannot be calculated (This lab value cannot be used to calculate CrCl because it is not a number: <0.30). ------------------------------------------------------------------------------------------------------------------ Recent Labs    06/12/17 1537  HGBA1C 15.8*    ------------------------------------------------------------------------------------------------------------------ No results for input(s): CHOL, HDL, LDLCALC, TRIG, CHOLHDL, LDLDIRECT in the last 72 hours. ------------------------------------------------------------------------------------------------------------------ No results for input(s): TSH, T4TOTAL, T3FREE, THYROIDAB in the last 72 hours.  Invalid input(s): FREET3 ------------------------------------------------------------------------------------------------------------------ No results for input(s): VITAMINB12, FOLATE, FERRITIN, TIBC, IRON, RETICCTPCT in the last 72 hours.  Coagulation profile No results for input(s): INR, PROTIME in the last 168 hours.  No results for input(s): DDIMER in the last 72 hours.  Cardiac Enzymes No results for input(s): CKMB, TROPONINI, MYOGLOBIN in the last 168 hours.  Invalid input(s): CK ------------------------------------------------------------------------------------------------------------------ Invalid input(s): [place]   1.  Acute enteritis Continue IV fluids Continue Cipro and Flagyl Advance diet to full liquid diet from clears Possible discharge tomorrow with oral antibiotics   2 acute hyperglycemia with insulin-dependent diabetes mellitus Continue Lantus-  Increase dose of Lantus hemoglobin A1c greater than 14 poor blood sugar control Blood sugars now improved  3 acute pseudohyponatremia, hypochloremia Improved with IV hydration   4 chronic tobacco smoking abuse/dependency Patient counseled regarding smoking cessation nicotine patch started strongly recommend she stop smoking, 4 minutes spent        Code Status Orders  (From admission, onward)        Start     Ordered   06/12/17 2227  Full code  Continuous     06/12/17 2226    Code Status History    Date Active Date Inactive Code Status Order ID Comments User Context   12/28/2014  03:05 12/28/2014 19:19 Full Code [id number]  Lance Coon, MD Inpatient            Consults  none  DVT Prophylaxis  Lovenox    Lab Results  Component Value Date   PLT 176 06/14/2017     Time Spent in minutes 61min  Greater than 50% of time spent in care coordination and counseling patient regarding the condition and plan of care.   Dustin Flock M.D on 06/14/2017 at 12:43 PM  Between 7am to 6pm - Pager - [phone]  After 6pm go to [email or web address] - [facility]  Office  [phone]

## 2017-06-15 LAB — GLUCOSE, CAPILLARY
GLUCOSE-CAPILLARY: 170 mg/dL — AB (ref 65–99)
GLUCOSE-CAPILLARY: 247 mg/dL — AB (ref 65–99)
GLUCOSE-CAPILLARY: 166 mg/dL — AB (ref 65–99)
GLUCOSE-CAPILLARY: 175 mg/dL — AB (ref 65–99)

## 2017-06-15 MED ORDER — CIPROFLOXACIN HCL 500 MG PO TABS: 500.0000 mg | ORAL_TABLET | Freq: Two times a day (BID) | ORAL | 0 refills | Status: AC

## 2017-06-15 MED ORDER — METRONIDAZOLE 500 MG PO TABS: 500.0000 mg | ORAL_TABLET | Freq: Three times a day (TID) | ORAL | 0 refills | Status: DC

## 2017-06-15 MED ORDER — INSULIN GLARGINE 100 UNIT/ML ~~LOC~~ SOLN: 27.0000 [IU] | Freq: Two times a day (BID) | SUBCUTANEOUS | 11 refills | Status: DC

## 2017-06-15 MED ORDER — INSULIN ASPART 100 UNIT/ML ~~LOC~~ SOLN: 0.0000 [IU] | Freq: Three times a day (TID) | SUBCUTANEOUS | 11 refills | Status: DC

## 2017-06-15 NOTE — Progress Notes (Signed)
Inpatient Diabetes Program Recommendations  AACE/ADA: New Consensus Statement on Inpatient Glycemic Control (2015)  Target Ranges:  Prepandial:   less than 140 mg/dL      Peak postprandial:   less than 180 mg/dL (1-2 hours)      Critically ill patients:  140 - 180 mg/dL   Results for Renee Mack, Renee Mack (MRN [id number]) as of 06/15/2017 14:29  Ref. Range 06/15/2017 07:35 06/15/2017 11:42  Glucose-Capillary Latest Ref Range: 65 - 99 mg/dL 170 (H) 247 (H)    Home DM Meds: Lantus 50 units BID        Novolog 25 TID (home Med Rec states NPH 25 TID but MD notes state pt says she takes Lantus and Novolog)  Current Orders: Lantus 27 units BID       Novolog Moderate Correction Scale/ SSI (0-15 units) TID AC + HS        MD- Please consider starting Novolog Meal Coverage:  Novolog 4 units TID with meals to start (hold if pt eats <50% of meal)      --Will follow patient during hospitalization--  Wyn Quaker RN, MSN, CDE Diabetes Coordinator Inpatient Glycemic Control Team Team Pager: [phone] (8a-5p)

## 2017-06-15 NOTE — Plan of Care (Signed)
Pt tolerating diet, nausea and abdominal pain seems to be improving. Possible discharge in AM.

## 2017-06-15 NOTE — Progress Notes (Signed)
[place] at [facility]                                                                                                                                                                                  Patient Demographics   Renee Mack, is a 51 y.o. female, DOB - Oct 29, 1965, HYI:[id number]  Admit date - 06/12/2017   Admitting Physician Gorden Harms, MD  Outpatient Primary MD for the patient is Sherlyn Lees, FNP   LOS - 3  Subjective: Patient is saying that she is 50% better still has mild abdominal pain, nausea and would rather stay one more day to get IV antibiotics and go home tomorrow.     Review of Systems:   CONSTITUTIONAL: No documented fever. No fatigue, weakness. No weight gain, no weight loss.  EYES: No blurry or double vision.  ENT: No tinnitus. No postnasal drip. No redness of the oropharynx.  RESPIRATORY: No cough, no wheeze, no hemoptysis. No dyspnea.  CARDIOVASCULAR: No chest pain. No orthopnea. No palpitations. No syncope.  GASTROINTESTINAL:  mild nausea, stomach pain gENITOURINARY: No dysuria or hematuria.  ENDOCRINE: No polyuria or nocturia. No heat or cold intolerance.  HEMATOLOGY: No anemia. No bruising. No bleeding.  INTEGUMENTARY: No rashes. No lesions.  MUSCULOSKELETAL: No arthritis. No swelling. No gout.  NEUROLOGIC: No numbness, tingling, or ataxia. No seizure-type activity.  PSYCHIATRIC: No anxiety. No insomnia. No ADD.    Vitals:   Vitals:   06/14/17 0528 06/14/17 1745 06/14/17 2012 06/15/17 0536  BP: 118/77 111/71 111/65 114/69  Pulse: 86 75 69 78  Resp: (!) 24 16 20 18   Temp: 98.1 F (36.7 C) 97.6 F (36.4 C) 97.7 F (36.5 C) (!) 97.5 F (36.4 C)  TempSrc: Oral Oral Oral Oral  SpO2: 98% 98% 97% 99%  Weight:      Height:        Wt Readings from Last 3 Encounters:  06/12/17 60.3 kg (133 lb)  04/29/17 53.5 kg (118 lb)  04/03/17 53.5 kg (118 lb)     Intake/Output Summary (Last 24 hours) at 06/15/2017  0904 Last data filed at 06/15/2017 0536 Gross per 24 hour  Intake 1062 ml  Output 1700 ml  Net -638 ml    Physical Exam:   GENERAL: Pleasant-appearing in no apparent distress.  HEAD, EYES, EARS, NOSE AND THROAT: Atraumatic, normocephalic. Extraocular muscles are intact. Pupils equal and reactive to light. Sclerae anicteric. No conjunctival injection. No oro-pharyngeal erythema.  NECK: Supple. There is no jugular venous distention. No bruits, no lymphadenopathy, no thyromegaly.  HEART: Regular rate and rhythm,. No murmurs, no rubs, no clicks.  LUNGS: Clear to auscultation bilaterally. No  rales or rhonchi. No wheezes.  ABDOMEN: Slight midepigastric tenderness, right lower quadrant tenderness, bowel sounds present.  EXTREMITIES: No evidence of any cyanosis, clubbing, or peripheral edema.  +2 pedal and radial pulses bilaterally.  NEUROLOGIC: The patient is alert, awake, and oriented x3 with no focal motor or sensory deficits appreciated bilaterally.  SKIN: Moist and warm with no rashes appreciated.  Psych: Not anxious, depressed LN: No inguinal LN enlargement    Antibiotics   Anti-infectives (From admission, onward)   Start     Dose/Rate Route Frequency Ordered Stop   06/15/17 0000  metroNIDAZOLE (FLAGYL) 500 MG tablet     500 mg Oral Every 8 hours 06/15/17 0857     06/15/17 0000  ciprofloxacin (CIPRO) 500 MG tablet     500 mg Oral 2 times daily 06/15/17 0857 06/25/17 2359   06/12/17 2200  metroNIDAZOLE (FLAGYL) tablet 500 mg     500 mg Oral Every 8 hours 06/12/17 2135     06/12/17 2145  ciprofloxacin (CIPRO) IVPB 400 mg     400 mg 200 mL/hr over 60 Minutes Intravenous Every 12 hours 06/12/17 2135        Medications   Scheduled Meds: . aspirin EC  81 mg Oral Daily  . citalopram  20 mg Oral Daily  . enoxaparin (LOVENOX) injection  40 mg Subcutaneous Q24H  . Influenza vac split quadrivalent PF  0.5 mL Intramuscular Tomorrow-1000  . insulin aspart  0-15 Units Subcutaneous TID PC  & HS  . insulin glargine  27 Units Subcutaneous BID  . metroNIDAZOLE  500 mg Oral Q8H  . nicotine  14 mg Transdermal Daily  . pantoprazole  40 mg Oral Daily  . pneumococcal 23 valent vaccine  0.5 mL Intramuscular Tomorrow-1000   Continuous Infusions: . ciprofloxacin Stopped (06/14/17 2355)   PRN Meds:.acetaminophen **OR** acetaminophen, HYDROcodone-acetaminophen, ketorolac, LORazepam, morphine injection, ondansetron **OR** ondansetron (ZOFRAN) IV, oxyCODONE-acetaminophen   Data Review:   Micro Results No results found for this or any previous visit (from the past 240 hour(s)).  Radiology Reports Ct Abdomen Pelvis W Contrast  Result Date: 06/12/2017 CLINICAL DATA:  51 year old female with abdominal distention and nausea vomiting. EXAM: CT ABDOMEN AND PELVIS WITH CONTRAST TECHNIQUE: Multidetector CT imaging of the abdomen and pelvis was performed using the standard protocol following bolus administration of intravenous contrast. CONTRAST:  16mL ISOVUE-300 IOPAMIDOL (ISOVUE-300) INJECTION 61% COMPARISON:  Abdominal CT dated 04/03/2017 FINDINGS: Lower chest: The visualized lung bases are clear. No intra-abdominal free air or free fluid. Hepatobiliary: No focal liver abnormality is seen. No gallstones, gallbladder wall thickening, or biliary dilatation. Pancreas: Unremarkable. No pancreatic ductal dilatation or surrounding inflammatory changes. Spleen: Normal in size without focal abnormality. Adrenals/Urinary Tract: The adrenal glands are unremarkable. There is no hydronephrosis on either side. The visualized ureters and urinary bladder appear unremarkable. Stomach/Bowel: There is thickening of a loop of small bowel in the right lower quadrant. This segment of bowel measures approximately 2.5 cm in diameter. There is slight fecalization of the small bowel loop just distal to the thickened segment. Findings may represent a segmental enteritis with associated ileus. However, a low-grade or early  small-bowel obstruction although less likely is not entirely excluded. Clinical correlation is recommended. There is focal protrusion of anterior wall of the transverse colon through a small supraumbilical hernia without evidence of obstruction or inflammation. The appendix is normal. Vascular/Lymphatic: There is advanced calcified and noncalcified plaque along the course of the aorta. The IVC is unremarkable. No portal venous gas.  No adenopathy. Reproductive: Hysterectomy.  No pelvic mass. Other: None Musculoskeletal: No acute osseous pathology. IMPRESSION: 1. Mildly thickened and inflamed loop of small bowel in the right lower quadrant may represent segmental enteritis and ileus. An early or low grade small bowel obstruction is less likely but not entirely excluded. Clinical correlation is recommended. 2. Focal protrusion of anterior wall of transverse colon into a few small supraumbilical hernia without evidence of obstruction or inflammation at this site. 3.  Aortic Atherosclerosis (ICD10-I70.0). Electronically Signed   By: Anner Crete M.D.   On: 06/12/2017 19:55     CBC Recent Labs  Lab 06/12/17 1636 06/13/17 0313 06/14/17 0336  WBC 8.6 8.6 8.7  HGB 15.2 14.5 13.8  HCT 46.6 41.8 40.6  PLT 223 188 176  MCV 100.4* 97.2 97.6  MCH 32.7 33.7 33.1  MCHC 32.6 34.7 33.9  RDW 14.4 14.1 14.2    Chemistries  Recent Labs  Lab 06/12/17 1636 06/13/17 0313 06/14/17 0336  NA 121* 133* 135  K 4.8 3.2* 3.3*  CL 90* 102 106  CO2 19* 24 24  GLUCOSE 695* 198* 202*  BUN 19 10 5*  CREATININE 0.65 0.31* <0.30*  CALCIUM 9.5 8.4* 8.3*  MG  --  1.8  --   AST 43*  --   --   ALT 33  --   --   ALKPHOS 88  --   --   BILITOT 0.9  --   --    ------------------------------------------------------------------------------------------------------------------ CrCl cannot be calculated (This lab value cannot be used to calculate CrCl because it is not a number:  <0.30). ------------------------------------------------------------------------------------------------------------------ Recent Labs    06/12/17 1537  HGBA1C 15.8*   ------------------------------------------------------------------------------------------------------------------ No results for input(s): CHOL, HDL, LDLCALC, TRIG, CHOLHDL, LDLDIRECT in the last 72 hours. ------------------------------------------------------------------------------------------------------------------ No results for input(s): TSH, T4TOTAL, T3FREE, THYROIDAB in the last 72 hours.  Invalid input(s): FREET3 ------------------------------------------------------------------------------------------------------------------ No results for input(s): VITAMINB12, FOLATE, FERRITIN, TIBC, IRON, RETICCTPCT in the last 72 hours.  Coagulation profile No results for input(s): INR, PROTIME in the last 168 hours.  No results for input(s): DDIMER in the last 72 hours.  Cardiac Enzymes No results for input(s): CKMB, TROPONINI, MYOGLOBIN in the last 168 hours.  Invalid input(s): CK ------------------------------------------------------------------------------------------------------------------ Invalid input(s): [place]   1.  Acute enteritis  improving slowly, likely discharge home tomorrow with Cipro, Flagyl.,  Nausea medicines, IV antibiotics for today. Continue Cipro and Flagyl Continue full liquids, change to solids tomorrow Possible discharge tomorrow with oral antibiotics   2 acute hyperglycemia with insulin-dependent diabetes mellitus Continue Lantus-  Increase dose of Lantus hemoglobin A1c greater than 14 poor blood sugar control Blood sugars now improved  3 acute pseudohyponatremia, hypochloremia Improved with IV hydration   4 chronic tobacco smoking abuse/dependency Patient counseled regarding smoking cessation nicotine patch started strongly recommend she stop smoking, 4  minutes spent  #5 chronic pain syndrome: Patient takes [place] codon 15 mg immediate release 3 times a day. 6.  Diabetes mellitus type 2: Patient says that she takes Lantus, NovoLog.  Blood sugar 170 this mornin gcontrolled.      Code Status Orders  (From admission, onward)        Start     Ordered   06/12/17 2227  Full code  Continuous     06/12/17 2226    Code Status History    Date Active Date Inactive Code Status Order ID Comments User Context   12/28/2014 03:05 12/28/2014 19:19 Full  Code [id number]  Lance Coon, MD Inpatient            Consults  none  DVT Prophylaxis  Lovenox    Lab Results  Component Value Date   PLT 176 06/14/2017     Time Spent in minutes 32min  Greater than 50% of time spent in care coordination and counseling patient regarding the condition and plan of care.   Epifanio Lesches M.D on 06/15/2017 at 9:04 AM  Between 7am to 6pm - Pager - [phone]  After 6pm go to [email or web address] - password [email or web address]   Office  [phone]

## 2017-06-16 LAB — GLUCOSE, CAPILLARY
Glucose-Capillary: 77 mg/dL (ref 65–99)
Glucose-Capillary: 251 mg/dL — ABNORMAL HIGH (ref 65–99)

## 2017-06-16 NOTE — Progress Notes (Signed)
Renee Mack to be D/C'd Home per MD order.  Discussed prescriptions and follow up appointments with the patient. Prescriptions given to patient, medication list explained in detail. Pt verbalized understanding.  Allergies as of 06/16/2017      Reactions   Metformin And Related Shortness Of Breath   Darvon [propoxyphene] Itching   Gabapentin Swelling   Nsaids Other (See Comments)   Ulcers   Tramadol Hives      Medication List    STOP taking these medications   HUMULIN N 100 UNIT/ML injection Generic drug:  insulin NPH Human   oxyCODONE 15 MG immediate release tablet Commonly known as:  ROXICODONE     TAKE these medications   acetaminophen 325 MG tablet Commonly known as:  TYLENOL Take 650 mg every 6 (six) hours as needed by mouth.   ADVAIR DISKUS 250-50 MCG/DOSE Aepb Generic drug:  Fluticasone-Salmeterol Inhale 1 Inhaler into the lungs 2 (two) times daily.   aspirin 81 MG tablet Take 81 mg by mouth daily.   ciprofloxacin 500 MG tablet Commonly known as:  CIPRO Take 1 tablet (500 mg total) by mouth 2 (two) times daily for 10 days.   citalopram 20 MG tablet Commonly known as:  CELEXA Take 20 mg daily by mouth.   esomeprazole 40 MG capsule Commonly known as:  NEXIUM Take 40 mg by mouth daily at 12 noon.   insulin aspart 100 UNIT/ML injection Commonly known as:  novoLOG Inject 0-15 Units into the skin 4 (four) times daily - after meals and at bedtime.   insulin glargine 100 UNIT/ML injection Commonly known as:  LANTUS Inject 0.27 mLs (27 Units total) into the skin 2 (two) times daily. What changed:  how much to take   LORazepam 0.5 MG tablet Commonly known as:  ATIVAN Take 1 tablet (0.5 mg total) by mouth every 8 (eight) hours as needed for anxiety.   metroNIDAZOLE 500 MG tablet Commonly known as:  FLAGYL Take 1 tablet (500 mg total) by mouth every 8 (eight) hours.   PROAIR HFA 108 (90 Base) MCG/ACT inhaler Generic drug:  albuterol Inhale 1 puff into the  lungs every 6 (six) hours.       Vitals:   06/16/17 0445 06/16/17 1517  BP: 102/62 (!) 111/57  Pulse: 67 67  Resp: 20   Temp: 97.6 F (36.4 C) 97.8 F (36.6 C)  SpO2: 98% 97%    Skin clean, dry and intact without evidence of skin break down, no evidence of skin tears noted. IV catheter discontinued intact. Site without signs and symptoms of complications. Dressing and pressure applied. Pt denies pain at this time. No complaints noted.  An After Visit Summary was printed and given to the patient.  Sharalyn Ink

## 2017-06-16 NOTE — Plan of Care (Signed)
Pt is progressing. Ambulated multiple times in hall. Pt is still requiring prn pain meds frequently

## 2017-06-16 NOTE — Progress Notes (Signed)
Advance the diet to solid diet and if she tolerates the diet discharge later today.  Discharge instructions are in the computer.  Discussed with patient.

## 2017-06-22 NOTE — Discharge Summary (Signed)
Renee Mack, is a 52 y.o. female  DOB Jan 18, 1966  MRN [id number].  Admission date:  06/12/2017  Admitting Physician  Gorden Harms, MD  Discharge Date: 12/ 27/2018  Primary MD  Sherlyn Lees, FNP  Recommendations for primary care physician for things to follow:   Follow-up with PCP in 1 week   Admission Diagnosis  Hyperglycemia [R73.9] Pain of upper abdomen [R10.10]   Discharge Diagnosis  Hyperglycemia [R73.9] Pain of upper abdomen [R10.10]    Active Problems:   Acute ischemic enteritis [facility])      Past Medical History:  Diagnosis Date  . Cancer ([place])   . COPD (chronic obstructive pulmonary disease) ([place])   . Diabetes mellitus without complication ([place])   . GERD (gastroesophageal reflux disease)   . HLD (hyperlipidemia)   . Pancreatitis   . Shingles     Past Surgical History:  Procedure Laterality Date  . ABDOMINAL HYSTERECTOMY     partial  . ECTOPIC PREGNANCY SURGERY Left   . LYMPHADENECTOMY    . SPLENECTOMY, PARTIAL    . vulvulasectomy         History of present illness and  Hospital Course:     Kindly see H&P for history of present illness and admission details, please review complete Labs, Consult reports and Test reports for all details in brief  HPI  from the history and physical done on the day of admission 51 year old female patient admitted on December 23 for right-sided abdominal pain, nausea, vomiting and decreased p.o. intake associated with generalized weakness, CT abdomen in the emergency room showed an inflammatory bowel loops in the right lower quadrant suggesting enteritis.  And she is admitted for the same.  Patient also has severe hyperglycemia with sugar 695 in the emergency room with normal anion gap.  Hospital Course  #1 acute infectious enteritis: Admitted to medical  service, started on Cipro, Flagyl, IV fluids for hydration.  And patient kept n.p.o. and started on insulin per sliding coverage.  Patient felt better with conservative treatment with fluids, antibiotics, started on clear liquids next day, gradually advancing the diet to regular diet before discharge and she tolerated the diet, no abdominal pain or nausea or vomiting.  We decreased IV fluids, discharge her home with Cipro, Flagyl for 10 days secondary to prolonged abdominal pain.  2.  Depression: Continue Celexa.  3.  GERD continue PPIs.  History of COPD: Continue Advair, pro-air.  4.  Diabetes mellitus type 2: Severe hyperglycemia on admission, due to #1, seen by diabetic nurse, initially required subcutaneous insulin with sliding scale coverage,  Started on Lantus 27 units twice daily, NovoLog moderate correction scale 3 times daily before meals  And HS discharged with  same.    Discharge Condition: Stable   Follow UP  Follow-up Information    Ro, [name], MD. Go on 07/06/2017.   Specialty:  Family Medicine Why:  Dr. Carolyn Stare, Wednesday, Jan. 16 at 10 a.m.  [phone] Contact information: [address] [id number] [phone]             Discharge Instructions  and  Discharge Medications     Allergies as of 06/16/2017      Reactions   Metformin And Related Shortness Of Breath   Darvon [propoxyphene] Itching   Gabapentin Swelling   Nsaids Other (See Comments)   Ulcers   Tramadol Hives      Medication List    STOP taking these medications  HUMULIN N 100 UNIT/ML injection Generic drug:  insulin NPH Human   oxyCODONE 15 MG immediate release tablet Commonly known as:  ROXICODONE     TAKE these medications   acetaminophen 325 MG tablet Commonly known as:  TYLENOL Take 650 mg every 6 (six) hours as needed by mouth.   ADVAIR DISKUS 250-50 MCG/DOSE Aepb Generic drug:  Fluticasone-Salmeterol Inhale 1 Inhaler into the lungs 2 (two) times  daily.   aspirin 81 MG tablet Take 81 mg by mouth daily.   ciprofloxacin 500 MG tablet Commonly known as:  CIPRO Take 1 tablet (500 mg total) by mouth 2 (two) times daily for 10 days.   citalopram 20 MG tablet Commonly known as:  CELEXA Take 20 mg daily by mouth.   esomeprazole 40 MG capsule Commonly known as:  NEXIUM Take 40 mg by mouth daily at 12 noon.   insulin aspart 100 UNIT/ML injection Commonly known as:  novoLOG Inject 0-15 Units into the skin 4 (four) times daily - after meals and at bedtime.   insulin glargine 100 UNIT/ML injection Commonly known as:  LANTUS Inject 0.27 mLs (27 Units total) into the skin 2 (two) times daily. What changed:  how much to take   LORazepam 0.5 MG tablet Commonly known as:  ATIVAN Take 1 tablet (0.5 mg total) by mouth every 8 (eight) hours as needed for anxiety.   metroNIDAZOLE 500 MG tablet Commonly known as:  FLAGYL Take 1 tablet (500 mg total) by mouth every 8 (eight) hours.   PROAIR HFA 108 (90 Base) MCG/ACT inhaler Generic drug:  albuterol Inhale 1 puff into the lungs every 6 (six) hours.         Diet and Activity recommendation: See Discharge Instructions above   Consults obtained -diabetic nurse   Major procedures and Radiology Reports - PLEASE review detailed and final reports for all details, in brief -     Ct Abdomen Pelvis W Contrast  Result Date: 06/12/2017 CLINICAL DATA:  52 year old female with abdominal distention and nausea vomiting. EXAM: CT ABDOMEN AND PELVIS WITH CONTRAST TECHNIQUE: Multidetector CT imaging of the abdomen and pelvis was performed using the standard protocol following bolus administration of intravenous contrast. CONTRAST:  79mL ISOVUE-300 IOPAMIDOL (ISOVUE-300) INJECTION 61% COMPARISON:  Abdominal CT dated 04/03/2017 FINDINGS: Lower chest: The visualized lung bases are clear. No intra-abdominal free air or free fluid. Hepatobiliary: No focal liver abnormality is seen. No gallstones,  gallbladder wall thickening, or biliary dilatation. Pancreas: Unremarkable. No pancreatic ductal dilatation or surrounding inflammatory changes. Spleen: Normal in size without focal abnormality. Adrenals/Urinary Tract: The adrenal glands are unremarkable. There is no hydronephrosis on either side. The visualized ureters and urinary bladder appear unremarkable. Stomach/Bowel: There is thickening of a loop of small bowel in the right lower quadrant. This segment of bowel measures approximately 2.5 cm in diameter. There is slight fecalization of the small bowel loop just distal to the thickened segment. Findings may represent a segmental enteritis with associated ileus. However, a low-grade or early small-bowel obstruction although less likely is not entirely excluded. Clinical correlation is recommended. There is focal protrusion of anterior wall of the transverse colon through a small supraumbilical hernia without evidence of obstruction or inflammation. The appendix is normal. Vascular/Lymphatic: There is advanced calcified and noncalcified plaque along the course of the aorta. The IVC is unremarkable. No portal venous gas. No adenopathy. Reproductive: Hysterectomy.  No pelvic mass. Other: None Musculoskeletal: No acute osseous pathology. IMPRESSION: 1. Mildly thickened and inflamed loop of  small bowel in the right lower quadrant may represent segmental enteritis and ileus. An early or low grade small bowel obstruction is less likely but not entirely excluded. Clinical correlation is recommended. 2. Focal protrusion of anterior wall of transverse colon into a few small supraumbilical hernia without evidence of obstruction or inflammation at this site. 3.  Aortic Atherosclerosis (ICD10-I70.0). Electronically Signed   By: Anner Crete M.D.   On: 06/12/2017 19:55    Micro Results     No results found for this or any previous visit (from the past 240 hour(s)).     Today   Subjective:   Renee Mack  today has no headache,no chest abdominal pain,no new weakness tingling or numbness, feels much better wants to go home today.   Objective:   Blood pressure (!) 111/57, pulse 67, temperature 97.8 F (36.6 C), temperature source Oral, resp. rate 20, height 5\' 1"  (1.549 m), weight 60.3 kg (133 lb), SpO2 97 %.  No intake or output data in the 24 hours ending 06/22/17 1216  Exam Awake Alert, Oriented x 3, No new F.N deficits, Normal affect [place].AT,PERRAL Supple Neck,No JVD, No cervical lymphadenopathy appriciated.  Symmetrical Chest wall movement, Good air movement bilaterally, CTAB RRR,No Gallops,Rubs or new Murmurs, No Parasternal Heave +ve B.Sounds, Abd Soft, Non tender, No organomegaly appriciated, No rebound -guarding or rigidity. No Cyanosis, Clubbing or edema, No new Rash or bruise  Data Review   CBC w Diff:  Lab Results  Component Value Date   WBC 8.7 06/14/2017   HGB 13.8 06/14/2017   HGB 16.1 (H) 04/19/2014   HCT 40.6 06/14/2017   HCT 48.7 (H) 04/19/2014   PLT 176 06/14/2017   PLT 318 04/19/2014   LYMPHOPCT 35 04/29/2017   MONOPCT 5 04/29/2017   EOSPCT 2 04/29/2017   BASOPCT 1 04/29/2017    CMP:  Lab Results  Component Value Date   NA 135 06/14/2017   NA 129 (L) 04/19/2014   K 3.3 (L) 06/14/2017   K 4.2 04/19/2014   CL 106 06/14/2017   CL 100 04/19/2014   CO2 24 06/14/2017   CO2 19 (L) 04/19/2014   BUN 5 (L) 06/14/2017   BUN 9 04/19/2014   CREATININE <0.30 (L) 06/14/2017   CREATININE 0.71 04/19/2014   PROT 7.5 06/12/2017   PROT 8.6 (H) 04/19/2014   ALBUMIN 3.8 06/12/2017   ALBUMIN 3.8 04/19/2014   BILITOT 0.9 06/12/2017   BILITOT 0.5 04/19/2014   ALKPHOS 88 06/12/2017   ALKPHOS 91 04/19/2014   AST 43 (H) 06/12/2017   AST 22 04/19/2014   ALT 33 06/12/2017   ALT 19 04/19/2014  .   Total Time in preparing paper work, data evaluation and todays exam - 35 minutes  Epifanio Lesches M.D on 06/16/2017 at 12:16 PM    Note: This dictation was  prepared with Dragon dictation along with smaller phrase technology. Any transcriptional errors that result from this process are unintentional.

## 2017-07-03 ENCOUNTER — Other Ambulatory Visit: Payer: Self-pay

## 2017-07-03 ENCOUNTER — Emergency Department
Admission: EM | Admit: 2017-07-03 | Discharge: 2017-07-04 | Disposition: A | Discharge status: Home / Self Care | Payer: Medicaid Other | Attending: Emergency Medicine | Admitting: Emergency Medicine

## 2017-07-03 DIAGNOSIS — Z79899 Other long term (current) drug therapy: Secondary | ICD-10-CM | POA: Insufficient documentation

## 2017-07-03 DIAGNOSIS — Z859 Personal history of malignant neoplasm, unspecified: Secondary | ICD-10-CM | POA: Diagnosis not present

## 2017-07-03 DIAGNOSIS — R197 Diarrhea, unspecified: Secondary | ICD-10-CM | POA: Diagnosis not present

## 2017-07-03 DIAGNOSIS — R1084 Generalized abdominal pain: Secondary | ICD-10-CM

## 2017-07-03 DIAGNOSIS — J449 Chronic obstructive pulmonary disease, unspecified: Secondary | ICD-10-CM | POA: Diagnosis not present

## 2017-07-03 DIAGNOSIS — F17210 Nicotine dependence, cigarettes, uncomplicated: Secondary | ICD-10-CM | POA: Diagnosis not present

## 2017-07-03 DIAGNOSIS — Z794 Long term (current) use of insulin: Secondary | ICD-10-CM | POA: Insufficient documentation

## 2017-07-03 DIAGNOSIS — E1165 Type 2 diabetes mellitus with hyperglycemia: Secondary | ICD-10-CM | POA: Insufficient documentation

## 2017-07-03 DIAGNOSIS — R1010 Upper abdominal pain, unspecified: Secondary | ICD-10-CM | POA: Insufficient documentation

## 2017-07-03 DIAGNOSIS — R112 Nausea with vomiting, unspecified: Secondary | ICD-10-CM | POA: Insufficient documentation

## 2017-07-03 DIAGNOSIS — Z7982 Long term (current) use of aspirin: Secondary | ICD-10-CM | POA: Diagnosis not present

## 2017-07-03 DIAGNOSIS — K5900 Constipation, unspecified: Secondary | ICD-10-CM | POA: Diagnosis not present

## 2017-07-03 DIAGNOSIS — R739 Hyperglycemia, unspecified: Secondary | ICD-10-CM

## 2017-07-03 LAB — CBC WITH DIFFERENTIAL/PLATELET
Basophils Absolute: 0.1 10*3/uL (ref 0–0.1)
Basophils Relative: 2 %
EOS ABS: 0.2 10*3/uL (ref 0–0.7)
EOS PCT: 2 %
HCT: 44 % (ref 35.0–47.0)
Hemoglobin: 14.9 g/dL (ref 12.0–16.0)
LYMPHS ABS: 4.7 10*3/uL — AB (ref 1.0–3.6)
LYMPHS PCT: 51 %
MCH: 32.9 pg (ref 26.0–34.0)
MCHC: 33.9 g/dL (ref 32.0–36.0)
MCV: 97.2 fL (ref 80.0–100.0)
MONO ABS: 0.5 10*3/uL (ref 0.2–0.9)
Monocytes Relative: 6 %
Neutro Abs: 3.8 10*3/uL (ref 1.4–6.5)
Neutrophils Relative %: 41 %
PLATELETS: 276 10*3/uL (ref 150–440)
RBC: 4.53 MIL/uL (ref 3.80–5.20)
RDW: 13 % (ref 11.5–14.5)
WBC: 9.4 10*3/uL (ref 3.6–11.0)

## 2017-07-03 LAB — COMPREHENSIVE METABOLIC PANEL
ALK PHOS: 95 U/L (ref 38–126)
ALT: 12 U/L — AB (ref 14–54)
AST: 20 U/L (ref 15–41)
Albumin: 3.7 g/dL (ref 3.5–5.0)
Anion gap: 14 (ref 5–15)
BUN: 26 mg/dL — AB (ref 6–20)
CALCIUM: 10.3 mg/dL (ref 8.9–10.3)
CHLORIDE: 87 mmol/L — AB (ref 101–111)
CO2: 25 mmol/L (ref 22–32)
CREATININE: 0.67 mg/dL (ref 0.44–1.00)
Glucose, Bld: 661 mg/dL (ref 65–99)
Potassium: 4.3 mmol/L (ref 3.5–5.1)
Sodium: 126 mmol/L — ABNORMAL LOW (ref 135–145)
Total Bilirubin: 1 mg/dL (ref 0.3–1.2)
Total Protein: 7.8 g/dL (ref 6.5–8.1)

## 2017-07-03 LAB — LIPASE, BLOOD: LIPASE: 26 U/L (ref 11–51)

## 2017-07-03 MED ORDER — SODIUM CHLORIDE 0.9 % IV BOLUS (SEPSIS)
1000.0000 mL | Freq: Once | INTRAVENOUS | Status: AC
  Administered 2017-07-03: 1000 mL via INTRAVENOUS

## 2017-07-03 MED ORDER — MORPHINE SULFATE (PF) 4 MG/ML IV SOLN
INTRAVENOUS | Status: AC
  Administered 2017-07-03: 4 mg via INTRAVENOUS
  Filled 2017-07-03: qty 1

## 2017-07-03 MED ORDER — MORPHINE SULFATE (PF) 4 MG/ML IV SOLN
4.0000 mg | Freq: Once | INTRAVENOUS | Status: AC
  Administered 2017-07-03: 4 mg via INTRAVENOUS

## 2017-07-03 MED ORDER — ONDANSETRON HCL 4 MG/2ML IJ SOLN
4.0000 mg | Freq: Once | INTRAMUSCULAR | Status: AC
  Administered 2017-07-03: 4 mg via INTRAVENOUS
  Filled 2017-07-03: qty 2

## 2017-07-03 NOTE — ED Notes (Signed)
ED Provider at bedside. 

## 2017-07-03 NOTE — ED Notes (Signed)
CRITICAL LAB: GLUCOSE is 661, [employer], Dr. Joni Fears notified, orders received

## 2017-07-03 NOTE — ED Notes (Signed)
Pt reports vomiting and diarrhea that started Sat morning, reports 10 x vomit and 5 x diarrhea today, unable to tolerate any PO

## 2017-07-03 NOTE — ED Triage Notes (Signed)
Patient reports symptoms began Saturday morning - vomiting abdominal pain and back pain.

## 2017-07-04 ENCOUNTER — Emergency Department: Payer: Medicaid Other

## 2017-07-04 ENCOUNTER — Encounter: Payer: Self-pay | Admitting: Radiology

## 2017-07-04 LAB — URINALYSIS, COMPLETE (UACMP) WITH MICROSCOPIC
BACTERIA UA: NONE SEEN
Bilirubin Urine: NEGATIVE
Glucose, UA: >=500 mg/dL — AB
Hgb urine dipstick: NEGATIVE
KETONES UR: 5 mg/dL — AB
LEUKOCYTES UA: NEGATIVE
Nitrite: NEGATIVE
Protein, ur: NEGATIVE mg/dL
SQUAMOUS EPITHELIAL / LPF: NONE SEEN
Specific Gravity, Urine: 1.028 (ref 1.005–1.030)
pH: 7 (ref 5.0–8.0)

## 2017-07-04 LAB — GLUCOSE, CAPILLARY
Glucose-Capillary: 438 mg/dL — ABNORMAL HIGH (ref 65–99)
Glucose-Capillary: 380 mg/dL — ABNORMAL HIGH (ref 65–99)
Glucose-Capillary: 393 mg/dL — ABNORMAL HIGH (ref 65–99)
Glucose-Capillary: 281 mg/dL — ABNORMAL HIGH (ref 65–99)

## 2017-07-04 MED ORDER — INSULIN ASPART 100 UNIT/ML ~~LOC~~ SOLN
5.0000 [IU] | Freq: Once | SUBCUTANEOUS | Status: AC
Start: 2017-07-04 — End: 2017-07-04
  Administered 2017-07-04: 5 [IU] via INTRAVENOUS
  Filled 2017-07-04: qty 1

## 2017-07-04 MED ORDER — SODIUM CHLORIDE 0.9 % IV BOLUS (SEPSIS)
500.0000 mL | Freq: Once | INTRAVENOUS | Status: AC
Start: 2017-07-04 — End: 2017-07-04
  Administered 2017-07-04: 500 mL via INTRAVENOUS

## 2017-07-04 MED ORDER — KETOROLAC TROMETHAMINE 30 MG/ML IJ SOLN
30.0000 mg | Freq: Once | INTRAMUSCULAR | Status: AC
  Administered 2017-07-04: 30 mg via INTRAVENOUS
  Filled 2017-07-04: qty 1

## 2017-07-04 MED ORDER — OXYCODONE-ACETAMINOPHEN 5-325 MG PO TABS
1.0000 | ORAL_TABLET | Freq: Once | ORAL | Status: AC
  Administered 2017-07-04: 1 via ORAL
  Filled 2017-07-04: qty 1

## 2017-07-04 MED ORDER — METOCLOPRAMIDE HCL 10 MG PO TABS: 10.0000 mg | ORAL_TABLET | Freq: Three times a day (TID) | ORAL | 0 refills | Status: DC | PRN

## 2017-07-04 MED ORDER — MORPHINE SULFATE (PF) 4 MG/ML IV SOLN
INTRAVENOUS | Status: AC
  Administered 2017-07-04: 4 mg via INTRAVENOUS
  Filled 2017-07-04: qty 1

## 2017-07-04 MED ORDER — MORPHINE SULFATE (PF) 4 MG/ML IV SOLN
4.0000 mg | Freq: Once | INTRAVENOUS | Status: AC
  Administered 2017-07-04: 4 mg via INTRAVENOUS

## 2017-07-04 MED ORDER — GI COCKTAIL ~~LOC~~
30.0000 mL | Freq: Once | ORAL | Status: AC
  Administered 2017-07-04: 30 mL via ORAL
  Filled 2017-07-04: qty 30

## 2017-07-04 MED ORDER — INSULIN ASPART 100 UNIT/ML ~~LOC~~ SOLN
10.0000 [IU] | Freq: Once | SUBCUTANEOUS | Status: AC
  Administered 2017-07-04: 10 [IU] via SUBCUTANEOUS
  Filled 2017-07-04: qty 1

## 2017-07-04 MED ORDER — IOPAMIDOL (ISOVUE-300) INJECTION 61%
75.0000 mL | Freq: Once | INTRAVENOUS | Status: AC | PRN
  Administered 2017-07-04: 75 mL via INTRAVENOUS

## 2017-07-04 NOTE — ED Provider Notes (Signed)
[facility] Emergency Department Provider Note   ____________________________________________   First MD Initiated Contact with Patient 07/03/17 2315     (approximate)  I have reviewed the triage vital signs and the nursing notes.   HISTORY  Chief Complaint Abdominal Pain; Emesis; and Back Pain    HPI Renee Mack is a 52 y.o. female who comes into the hospital today with abdominal pain.  The patient states that the pain started about 2 days ago.  She reports that the pain is in her upper abdomen and radiates to her back.  The patient states that she started vomiting and the pain is just been getting worse and worse over the last couple of days.  The patient states that her pain is now a 9 out of 10 in intensity.  She states that when she vomits it is yellow appearing liquid.  She could not take anything for pain as she has not been able to keep down even water.  The pain is been constant and sharp.  The patient was admitted last month with similar symptoms and was found to have a bowel obstruction.  The patient states that she has lost 40 or 50 pounds over the last few months and she has had a decreased appetite.  The patient is also had some pain with urination and some dark appearing urine.  The patient's abdomen has been distended but she states she did have an episode of diarrhea this morning.  The patient came into the hospital today for further evaluation.   Past Medical History:  Diagnosis Date  . Cancer ([place])   . COPD (chronic obstructive pulmonary disease) ([place])   . Diabetes mellitus without complication ([place])   . GERD (gastroesophageal reflux disease)   . HLD (hyperlipidemia)   . Pancreatitis   . Shingles     Patient Active Problem List   Diagnosis Date Noted  . Acute ischemic enteritis ([place]) 06/12/2017  . Pyelonephritis 12/28/2014  . Type 2 diabetes mellitus ([place]) 12/28/2014  . COPD (chronic obstructive pulmonary disease) ([place]) 12/28/2014    . GERD (gastroesophageal reflux disease) 12/28/2014  . History of shingles 12/28/2014  . HLD (hyperlipidemia) 12/28/2014    Past Surgical History:  Procedure Laterality Date  . ABDOMINAL HYSTERECTOMY     partial  . ECTOPIC PREGNANCY SURGERY Left   . LYMPHADENECTOMY    . SPLENECTOMY, PARTIAL    . vulvulasectomy      Prior to Admission medications   Medication Sig Start Date End Date Taking? Authorizing Provider  acetaminophen (TYLENOL) 325 MG tablet Take 650 mg by mouth every 6 (six) hours as needed for mild pain.    Yes [provider]  albuterol (PROAIR HFA) 108 (90 Base) MCG/ACT inhaler Inhale 1 puff into the lungs every 6 (six) hours as needed for wheezing or shortness of breath.    Yes [provider]  aspirin 81 MG tablet Take 81 mg by mouth daily.   Yes [provider]  citalopram (CELEXA) 20 MG tablet Take 20 mg daily by mouth.   Yes [provider]  esomeprazole (NEXIUM) 40 MG capsule Take 40 mg by mouth daily at 12 noon.   Yes [provider]  insulin aspart (NOVOLOG) 100 UNIT/ML injection Inject 0-15 Units into the skin 4 (four) times daily - after meals and at bedtime. Patient taking differently: Inject 25 Units into the skin 4 (four) times daily - after meals and at bedtime.  06/15/17  Yes Epifanio Lesches,  MD  insulin glargine (LANTUS) 100 UNIT/ML injection Inject 0.27 mLs (27 Units total) into the skin 2 (two) times daily. Patient taking differently: Inject 50 Units into the skin 2 (two) times daily.  06/15/17  Yes Epifanio Lesches, MD  LORazepam (ATIVAN) 0.5 MG tablet Take 1 tablet (0.5 mg total) by mouth every 8 (eight) hours as needed for anxiety. 02/06/17 02/06/18 Yes Lisa Roca, MD    Allergies Metformin and related; Darvon [propoxyphene]; Gabapentin; Nsaids; and Tramadol  Family History  Problem Relation Age of Onset  . Cancer Mother   . Osteoporosis Sister   . Heart disease Brother     Social  History Social History   Tobacco Use  . Smoking status: Current Every Day Smoker    Packs/day: 1.00    Types: Cigarettes  . Smokeless tobacco: Never Used  Substance Use Topics  . Alcohol use: No  . Drug use: No    Review of Systems  Constitutional: chills Eyes: No visual changes. ENT: No sore throat. Cardiovascular: Denies chest pain. Respiratory: Denies shortness of breath. Gastrointestinal:  abdominal pain.   nausea, vomiting.   diarrhea.  No constipation. Genitourinary: Negative for dysuria. Musculoskeletal: Negative for back pain. Skin: Negative for rash. Neurological: Negative for headaches, focal weakness or numbness.   ____________________________________________   PHYSICAL EXAM:  VITAL SIGNS: ED Triage Vitals [07/03/17 2208]  Enc Vitals Group     BP (!) 151/74     Pulse Rate (!) 101     Resp 18     Temp 98.1 F (36.7 C)     Temp Source Oral     SpO2 97 %     Weight      Height      Head Circumference      Peak Flow      Pain Score 9     Pain Loc      Pain Edu?      Excl. in [place]?     Constitutional: Alert and oriented. Well appearing and in moderate distress. Eyes: Conjunctivae are normal. PERRL. EOMI. Head: Atraumatic. Nose: No congestion/rhinnorhea. Mouth/Throat: Mucous membranes are moist.  Oropharynx non-erythematous. Cardiovascular: Normal rate, regular rhythm. Grossly normal heart sounds.  Good peripheral circulation. Respiratory: Normal respiratory effort.  No retractions. Lungs CTAB. Gastrointestinal: Soft to palpation worse in the upper abdomen. No distention.  Positive bowel sounds Musculoskeletal: No lower extremity tenderness nor edema.   Neurologic:  Normal speech and language.  Skin:  Skin is warm, dry and intact.  Psychiatric: Mood and affect are normal.   ____________________________________________   LABS (all labs ordered are listed, but only abnormal results are displayed)  Labs Reviewed  COMPREHENSIVE METABOLIC PANEL -  Abnormal; Notable for the following components:      Result Value   Sodium 126 (*)    Chloride 87 (*)    Glucose, Bld 661 (*)    BUN 26 (*)    ALT 12 (*)    All other components within normal limits  CBC WITH DIFFERENTIAL/PLATELET - Abnormal; Notable for the following components:   Lymphs Abs 4.7 (*)    All other components within normal limits  URINALYSIS, COMPLETE (UACMP) WITH MICROSCOPIC - Abnormal; Notable for the following components:   Color, Urine STRAW (*)    APPearance CLEAR (*)    Glucose, UA >=500 (*)    Ketones, ur 5 (*)    All other components within normal limits  GLUCOSE, CAPILLARY - Abnormal; Notable for the following components:  Glucose-Capillary 438 (*)    All other components within normal limits  GLUCOSE, CAPILLARY - Abnormal; Notable for the following components:   Glucose-Capillary 380 (*)    All other components within normal limits  GLUCOSE, CAPILLARY - Abnormal; Notable for the following components:   Glucose-Capillary 393 (*)    All other components within normal limits  GLUCOSE, CAPILLARY - Abnormal; Notable for the following components:   Glucose-Capillary 281 (*)    All other components within normal limits  LIPASE, BLOOD  CBG MONITORING, ED  CBG MONITORING, ED   ____________________________________________  EKG  ED ECG REPORT I, Loney Hering, the attending physician, personally viewed and interpreted this ECG.   Date: 07/04/2017  EKG Time: 0042  Rate: 74  Rhythm: normal sinus rhythm  Axis: normal  Intervals:none  ST&T Change: none  ____________________________________________  RADIOLOGY  Ct Abdomen Pelvis W Contrast  Result Date: 07/04/2017 CLINICAL DATA:  Abdominal pain, nausea and vomiting. History of cancer of the vulva. Prior hysterectomy. EXAM: CT ABDOMEN AND PELVIS WITH CONTRAST TECHNIQUE: Multidetector CT imaging of the abdomen and pelvis was performed using the standard protocol following bolus administration of  intravenous contrast. CONTRAST:  63mL ISOVUE-300 IOPAMIDOL (ISOVUE-300) INJECTION 61% COMPARISON:  06/12/2017 CT abdomen/pelvis. FINDINGS: Lower chest: No significant pulmonary nodules or acute consolidative airspace disease. Hepatobiliary: Normal liver size. No liver mass. Normal gallbladder with no radiopaque cholelithiasis. No intrahepatic biliary ductal dilatation. Common bile duct diameter 7 mm, borderline mildly dilated, unchanged. Smooth distal tapering of the common bile duct. No radiopaque choledocholithiasis. Pancreas: Normal, with no mass or duct dilation. Spleen: Normal size. No mass. Adrenals/Urinary Tract: Normal adrenals. Normal kidneys with no hydronephrosis and no renal mass. Normal bladder. Stomach/Bowel: Normal non-distended stomach. Normal caliber small bowel with no small bowel wall thickening. Normal appendix. There is a small midline supraumbilical ventral abdominal hernia containing fat and a tiny portion of the transverse colon (series 2/image 34), unchanged. No large bowel wall thickening, pneumatosis or pericolonic fat stranding. Moderate colorectal stool volume. Separate stable small fat containing high midline supraumbilical ventral abdominal hernia. Stable small fat containing left periumbilical ventral abdominal hernia. Vascular/Lymphatic: Atherosclerotic nonaneurysmal abdominal aorta. Patent portal, splenic, hepatic and renal veins. New asymmetric mild left inguinal lymphadenopathy measuring up to the 1.3 cm (series 2/image 85). Reproductive: Status post hysterectomy, with no abnormal findings at the vaginal cuff. No adnexal mass. Other: No pneumoperitoneum, ascites or focal fluid collection. Musculoskeletal: No aggressive appearing focal osseous lesions. IMPRESSION: 1. Stable small supraumbilical midline ventral abdominal hernia containing a tiny portion of the transverse colon. No evidence bowel obstruction or ischemia at this time. 2. Moderate colorectal stool volume, suggesting  constipation. 3. New nonspecific asymmetric mild left inguinal lymphadenopathy. Nodal metastases are not excluded given the reported history of cancer of the vulva. 4. Stable minimally prominent common bile duct (7 mm diameter) without intrahepatic biliary ductal dilatation. Consider correlation with serum bilirubin levels. 5.  Aortic Atherosclerosis (ICD10-I70.0). Electronically Signed   By: Ilona Sorrel M.D.   On: 07/04/2017 00:36    ____________________________________________   PROCEDURES  Procedure(s) performed: None  Procedures  Critical Care performed: No  ____________________________________________   INITIAL IMPRESSION / ASSESSMENT AND PLAN / ED COURSE  As part of my medical decision making, I reviewed the following data within the electronic MEDICAL RECORD NUMBER Notes from prior ED visits and [place] Controlled Substance Database   This is a 53 year old female who comes into the hospital today with some abdominal pain.  My differential diagnosis includes gastroenteritis, pancreatitis, cholecystitis, small bowel obstruction, urinary tract infection  I did check some blood work on the patient to include a CMP, CBC and a lipase.  The patient's CMP shows a sodium of 126 and a sugar of 661.  The patient's corrected sodium is 135.  Patient's lipase is unremarkable and CBC is normal.  I will send a urinalysis on the patient and I will also send the patient for a CT scan of her abdomen and pelvis.  She received a dose of morphine as well as Zofran and 2 L of normal saline.  She will be reassessed.  Clinical Course as of Jul 04 910  Mayra Neer  1610 The patient states that she feels well at this time.  I did order some insulin for the patient.  She is able to drink without vomiting.  Once her sugars have decreased below 200 the patient will be discharged to follow-up with her primary care physician.  The patient is comfortable with that plan.  [AW]    Clinical Course User  Index [AW] Loney Hering, MD   Has been able to drink without any vomiting.  She does feel improved.  She will be discharged.  Her blood sugars 281.  ____________________________________________   FINAL CLINICAL IMPRESSION(S) / ED DIAGNOSES  Final diagnoses:  Hyperglycemia  Generalized abdominal pain  Nausea vomiting and diarrhea  Constipation, unspecified constipation type     ED Discharge Orders    None       Note:  This document was prepared using Dragon voice recognition software and may include unintentional dictation errors.    Loney Hering, MD 07/04/17 [phone]

## 2017-07-04 NOTE — Discharge Instructions (Signed)
Please follow up with your primary care physician.

## 2017-07-04 NOTE — ED Notes (Signed)
E-signature box not working. Pt verbalized understanding of discharge instructions and denied questions. 

## 2017-07-04 NOTE — ED Notes (Signed)
CBG 393. Dr. Dahlia Client notified and orders received.

## 2017-07-14 ENCOUNTER — Other Ambulatory Visit: Payer: Self-pay

## 2017-07-14 ENCOUNTER — Emergency Department
Admission: EM | Admit: 2017-07-14 | Discharge: 2017-07-14 | Disposition: A | Discharge status: Home / Self Care | Payer: Medicaid Other | Attending: Emergency Medicine | Admitting: Emergency Medicine

## 2017-07-14 DIAGNOSIS — J029 Acute pharyngitis, unspecified: Secondary | ICD-10-CM | POA: Diagnosis present

## 2017-07-14 DIAGNOSIS — Z794 Long term (current) use of insulin: Secondary | ICD-10-CM | POA: Diagnosis not present

## 2017-07-14 DIAGNOSIS — Z79899 Other long term (current) drug therapy: Secondary | ICD-10-CM | POA: Diagnosis not present

## 2017-07-14 DIAGNOSIS — E119 Type 2 diabetes mellitus without complications: Secondary | ICD-10-CM | POA: Diagnosis not present

## 2017-07-14 DIAGNOSIS — R51 Headache: Secondary | ICD-10-CM | POA: Insufficient documentation

## 2017-07-14 DIAGNOSIS — J020 Streptococcal pharyngitis: Secondary | ICD-10-CM | POA: Diagnosis not present

## 2017-07-14 DIAGNOSIS — J449 Chronic obstructive pulmonary disease, unspecified: Secondary | ICD-10-CM | POA: Insufficient documentation

## 2017-07-14 DIAGNOSIS — F17210 Nicotine dependence, cigarettes, uncomplicated: Secondary | ICD-10-CM | POA: Insufficient documentation

## 2017-07-14 DIAGNOSIS — Z8544 Personal history of malignant neoplasm of other female genital organs: Secondary | ICD-10-CM | POA: Insufficient documentation

## 2017-07-14 DIAGNOSIS — Z7982 Long term (current) use of aspirin: Secondary | ICD-10-CM | POA: Insufficient documentation

## 2017-07-14 LAB — GROUP A STREP BY PCR: GROUP A STREP BY PCR: NOT DETECTED

## 2017-07-14 MED ORDER — AMOXICILLIN 250 MG/5ML PO SUSR
875.0000 mg | Freq: Once | ORAL | Status: AC
  Administered 2017-07-14: 875 mg via ORAL
  Filled 2017-07-14: qty 20

## 2017-07-14 MED ORDER — AMOXICILLIN 875 MG PO TABS: 875.0000 mg | ORAL_TABLET | Freq: Two times a day (BID) | ORAL | 0 refills | Status: AC

## 2017-07-14 MED ORDER — DEXAMETHASONE SODIUM PHOSPHATE 10 MG/ML IJ SOLN
10.0000 mg | Freq: Once | INTRAMUSCULAR | Status: AC
  Administered 2017-07-14: 10 mg via INTRAMUSCULAR
  Filled 2017-07-14: qty 1

## 2017-07-14 NOTE — ED Provider Notes (Signed)
[facility] Emergency Department Provider Note  ____________________________________________  Time seen: Approximately 9:21 PM  I have reviewed the triage vital signs and the nursing notes.   HISTORY  Chief Complaint Sore Throat    HPI Renee Mack is a 52 y.o. female presents to the emergency department with pharyngitis, headache, anterior neck discomfort and abdominal discomfort that started today.  Patient has a history of vulvar carcinoma and is currently receiving treatments.  Patient reports that she can swallow and is managing her own secretions.  She denies shortness of breath, chest tightness and abdominal pain.   Past Medical History:  Diagnosis Date  . Cancer ([place])   . COPD (chronic obstructive pulmonary disease) ([place])   . Diabetes mellitus without complication ([place])   . GERD (gastroesophageal reflux disease)   . HLD (hyperlipidemia)   . Pancreatitis   . Shingles     Patient Active Problem List   Diagnosis Date Noted  . Acute ischemic enteritis ([place]) 06/12/2017  . Pyelonephritis 12/28/2014  . Type 2 diabetes mellitus ([place]) 12/28/2014  . COPD (chronic obstructive pulmonary disease) ([place]) 12/28/2014  . GERD (gastroesophageal reflux disease) 12/28/2014  . History of shingles 12/28/2014  . HLD (hyperlipidemia) 12/28/2014    Past Surgical History:  Procedure Laterality Date  . ABDOMINAL HYSTERECTOMY     partial  . ECTOPIC PREGNANCY SURGERY Left   . LYMPHADENECTOMY    . SPLENECTOMY, PARTIAL    . vulvulasectomy      Prior to Admission medications   Medication Sig Start Date End Date Taking? Authorizing Provider  acetaminophen (TYLENOL) 325 MG tablet Take 650 mg by mouth every 6 (six) hours as needed for mild pain.     [provider]  albuterol (PROAIR HFA) 108 (90 Base) MCG/ACT inhaler Inhale 1 puff into the lungs every 6 (six) hours as needed for wheezing or shortness of breath.     [provider]  amoxicillin  (AMOXIL) 875 MG tablet Take 1 tablet (875 mg total) by mouth 2 (two) times daily for 10 days. 07/14/17 07/24/17  Lannie Fields, PA-C  aspirin 81 MG tablet Take 81 mg by mouth daily.    [provider]  citalopram (CELEXA) 20 MG tablet Take 20 mg daily by mouth.    [provider]  esomeprazole (NEXIUM) 40 MG capsule Take 40 mg by mouth daily at 12 noon.    [provider]  insulin aspart (NOVOLOG) 100 UNIT/ML injection Inject 0-15 Units into the skin 4 (four) times daily - after meals and at bedtime. Patient taking differently: Inject 25 Units into the skin 4 (four) times daily - after meals and at bedtime.  06/15/17   Epifanio Lesches, MD  insulin glargine (LANTUS) 100 UNIT/ML injection Inject 0.27 mLs (27 Units total) into the skin 2 (two) times daily. Patient taking differently: Inject 50 Units into the skin 2 (two) times daily.  06/15/17   Epifanio Lesches, MD  LORazepam (ATIVAN) 0.5 MG tablet Take 1 tablet (0.5 mg total) by mouth every 8 (eight) hours as needed for anxiety. 02/06/17 02/06/18  Lisa Roca, MD  metoCLOPramide (REGLAN) 10 MG tablet Take 1 tablet (10 mg total) by mouth every 8 (eight) hours as needed. 07/04/17   Loney Hering, MD    Allergies Metformin and related; Darvon [propoxyphene]; Gabapentin; Nsaids; and Tramadol  Family History  Problem Relation Age of Onset  . Cancer Mother   . Osteoporosis Sister   . Heart disease Brother  Social History Social History   Tobacco Use  . Smoking status: Current Every Day Smoker    Packs/day: 1.00    Types: Cigarettes  . Smokeless tobacco: Never Used  Substance Use Topics  . Alcohol use: No  . Drug use: No     Review of Systems  Constitutional: No fever/chills Eyes: No visual changes. No discharge ENT: Patient has pharyngitis Cardiovascular: no chest pain. Respiratory: no cough. No SOB. Gastrointestinal: No abdominal pain.  No nausea, no vomiting.  No diarrhea.  No  constipation. Musculoskeletal: Negative for musculoskeletal pain. Skin: Negative for rash, abrasions, lacerations, ecchymosis. Neurological: Negative for headaches, focal weakness or numbness.  ____________________________________________   PHYSICAL EXAM:  VITAL SIGNS: ED Triage Vitals  Enc Vitals Group     BP 07/14/17 1917 (!) 150/77     Pulse Rate 07/14/17 1917 (!) 104     Resp 07/14/17 1917 18     Temp 07/14/17 1917 98.4 F (36.9 C)     Temp Source 07/14/17 1917 Oral     SpO2 07/14/17 1917 96 %     Weight 07/14/17 1917 133 lb (60.3 kg)     Height 07/14/17 1917 5\' 1"  (1.549 m)     Head Circumference --      Peak Flow --      Pain Score 07/14/17 1920 9     Pain Loc --      Pain Edu? --      Excl. in [place]? --      Constitutional: Alert and oriented. Well appearing and in no acute distress. Eyes: Conjunctivae are normal. PERRL. EOMI. Head: Atraumatic. ENT:      Ears: TMs are pearly.      Nose: No congestion/rhinnorhea.      Mouth/Throat: Mucous membranes are moist.  Posterior pharynx is erythematous with tonsillar exudate. Neck: No stridor.  Palpable cervical spine tenderness to palpation. Cardiovascular: Normal rate, regular rhythm. Normal S1 and S2.  Good peripheral circulation. Respiratory: Normal respiratory effort without tachypnea or retractions. Lungs CTAB. Good air entry to the bases with no decreased or absent breath sounds. Skin:  Skin is warm, dry and intact. No rash noted. Psychiatric: Mood and affect are normal. Speech and behavior are normal. Patient exhibits appropriate insight and judgement.   ____________________________________________   LABS (all labs ordered are listed, but only abnormal results are displayed)  Labs Reviewed  GROUP A STREP BY PCR   ____________________________________________  EKG   ____________________________________________  RADIOLOGY   No results  found.  ____________________________________________    PROCEDURES  Procedure(s) performed:    Procedures    Medications  dexamethasone (DECADRON) injection 10 mg (10 mg Intramuscular Given 07/14/17 2047)  amoxicillin (AMOXIL) 250 MG/5ML suspension 875 mg (875 mg Oral Given 07/14/17 2046)     ____________________________________________   INITIAL IMPRESSION / ASSESSMENT AND PLAN / ED COURSE  Pertinent labs & imaging results that were available during my care of the patient were reviewed by me and considered in my medical decision making (see chart for details).  Review of the [place] CSRS was performed in accordance of the [place] prior to dispensing any controlled drugs.     Assessment and plan Strep pharyngitis Patient presents to the emergency department with pharyngitis, tonsillar exudate and anterior neck discomfort.  Differential diagnosis included peritonsillar abscess, strep pharyngitis and viral URI.  Absence of cough, congestion and rhinorrhea decreases suspicion for viral URI.  On physical exam, tonsils were symmetrical and uvula was midline.  Patient was treated  empirically for strep pharyngitis with Decadron and amoxicillin.  Patient was advised to return to the emergency department for new or worsening symptoms.  Vital signs are reassuring prior to discharge.   ____________________________________________  FINAL CLINICAL IMPRESSION(S) / ED DIAGNOSES  Final diagnoses:  Strep pharyngitis      NEW MEDICATIONS STARTED DURING THIS VISIT:  ED Discharge Orders        Ordered    amoxicillin (AMOXIL) 875 MG tablet  2 times daily     07/14/17 2056          This chart was dictated using voice recognition software/Dragon. Despite best efforts to proofread, errors can occur which can change the meaning. Any change was purely unintentional.    Karren Cobble 07/14/17 2125    Harvest Dark, MD 07/14/17 2216

## 2017-07-14 NOTE — ED Notes (Signed)
ED Provider at bedside to discuss disposition 

## 2017-07-14 NOTE — ED Triage Notes (Signed)
Pt woke up with sore throat today. C/o of head pain and facial pain. Denies congestion. No distress noted. Alert, oriented, ambulatory.

## 2017-07-17 ENCOUNTER — Emergency Department: Payer: Medicaid Other

## 2017-07-17 ENCOUNTER — Inpatient Hospital Stay
Admission: EM | Admit: 2017-07-17 | Discharge: 2017-07-20 | DRG: 637 | Disposition: A | Discharge status: Home / Self Care | Payer: Medicaid Other | Attending: Internal Medicine | Admitting: Internal Medicine

## 2017-07-17 ENCOUNTER — Other Ambulatory Visit: Payer: Self-pay

## 2017-07-17 DIAGNOSIS — E860 Dehydration: Secondary | ICD-10-CM | POA: Diagnosis present

## 2017-07-17 DIAGNOSIS — E876 Hypokalemia: Secondary | ICD-10-CM | POA: Diagnosis present

## 2017-07-17 DIAGNOSIS — E785 Hyperlipidemia, unspecified: Secondary | ICD-10-CM | POA: Diagnosis present

## 2017-07-17 DIAGNOSIS — K859 Acute pancreatitis without necrosis or infection, unspecified: Secondary | ICD-10-CM | POA: Diagnosis present

## 2017-07-17 DIAGNOSIS — N179 Acute kidney failure, unspecified: Secondary | ICD-10-CM | POA: Diagnosis present

## 2017-07-17 DIAGNOSIS — F17210 Nicotine dependence, cigarettes, uncomplicated: Secondary | ICD-10-CM | POA: Diagnosis present

## 2017-07-17 DIAGNOSIS — J449 Chronic obstructive pulmonary disease, unspecified: Secondary | ICD-10-CM | POA: Diagnosis present

## 2017-07-17 DIAGNOSIS — E1110 Type 2 diabetes mellitus with ketoacidosis without coma: Principal | ICD-10-CM | POA: Diagnosis present

## 2017-07-17 DIAGNOSIS — Z90710 Acquired absence of both cervix and uterus: Secondary | ICD-10-CM | POA: Diagnosis not present

## 2017-07-17 DIAGNOSIS — Z6823 Body mass index (BMI) 23.0-23.9, adult: Secondary | ICD-10-CM | POA: Diagnosis not present

## 2017-07-17 DIAGNOSIS — Z7982 Long term (current) use of aspirin: Secondary | ICD-10-CM | POA: Diagnosis not present

## 2017-07-17 DIAGNOSIS — F329 Major depressive disorder, single episode, unspecified: Secondary | ICD-10-CM | POA: Diagnosis present

## 2017-07-17 DIAGNOSIS — E1310 Other specified diabetes mellitus with ketoacidosis without coma: Secondary | ICD-10-CM

## 2017-07-17 DIAGNOSIS — E871 Hypo-osmolality and hyponatremia: Secondary | ICD-10-CM | POA: Diagnosis present

## 2017-07-17 DIAGNOSIS — I10 Essential (primary) hypertension: Secondary | ICD-10-CM | POA: Diagnosis present

## 2017-07-17 DIAGNOSIS — K219 Gastro-esophageal reflux disease without esophagitis: Secondary | ICD-10-CM | POA: Diagnosis present

## 2017-07-17 DIAGNOSIS — Z9081 Acquired absence of spleen: Secondary | ICD-10-CM

## 2017-07-17 DIAGNOSIS — R739 Hyperglycemia, unspecified: Secondary | ICD-10-CM | POA: Diagnosis not present

## 2017-07-17 DIAGNOSIS — E875 Hyperkalemia: Secondary | ICD-10-CM | POA: Diagnosis present

## 2017-07-17 DIAGNOSIS — E118 Type 2 diabetes mellitus with unspecified complications: Secondary | ICD-10-CM

## 2017-07-17 DIAGNOSIS — Z794 Long term (current) use of insulin: Secondary | ICD-10-CM | POA: Diagnosis not present

## 2017-07-17 DIAGNOSIS — E43 Unspecified severe protein-calorie malnutrition: Secondary | ICD-10-CM

## 2017-07-17 DIAGNOSIS — K861 Other chronic pancreatitis: Secondary | ICD-10-CM | POA: Diagnosis present

## 2017-07-17 DIAGNOSIS — J020 Streptococcal pharyngitis: Secondary | ICD-10-CM | POA: Diagnosis present

## 2017-07-17 LAB — PHOSPHORUS: Phosphorus: 1.7 mg/dL — ABNORMAL LOW (ref 2.5–4.6)

## 2017-07-17 LAB — BASIC METABOLIC PANEL
Anion gap: 12 (ref 5–15)
BUN: 27 mg/dL — AB (ref 6–20)
CO2: 10 mmol/L — ABNORMAL LOW (ref 22–32)
Calcium: 8.5 mg/dL — ABNORMAL LOW (ref 8.9–10.3)
Chloride: 105 mmol/L (ref 101–111)
Creatinine, Ser: 0.79 mg/dL (ref 0.44–1.00)
GFR calc Af Amer: >60 mL/min (ref >60)
Glucose, Bld: 243 mg/dL — ABNORMAL HIGH (ref 65–99)
Potassium: 4.4 mmol/L (ref 3.5–5.1)
SODIUM: 127 mmol/L — AB (ref 135–145)

## 2017-07-17 LAB — CBC
HCT: 45.3 % (ref 35.0–47.0)
HEMATOCRIT: 56.8 % — AB (ref 35.0–47.0)
HEMOGLOBIN: 18.1 g/dL — AB (ref 12.0–16.0)
Hemoglobin: 15 g/dL (ref 12.0–16.0)
MCH: 32.7 pg (ref 26.0–34.0)
MCH: 32 pg (ref 26.0–34.0)
MCHC: 31.9 g/dL — AB (ref 32.0–36.0)
MCHC: 33.1 g/dL (ref 32.0–36.0)
MCV: 102.8 fL — AB (ref 80.0–100.0)
MCV: 96.7 fL (ref 80.0–100.0)
PLATELETS: 279 10*3/uL (ref 150–440)
Platelets: 349 10*3/uL (ref 150–440)
RBC: 5.52 MIL/uL — ABNORMAL HIGH (ref 3.80–5.20)
RBC: 4.68 MIL/uL (ref 3.80–5.20)
RDW: 13.8 % (ref 11.5–14.5)
RDW: 12.7 % (ref 11.5–14.5)
WBC: 14.4 10*3/uL — ABNORMAL HIGH (ref 3.6–11.0)
WBC: 16.5 10*3/uL — ABNORMAL HIGH (ref 3.6–11.0)

## 2017-07-17 LAB — GLUCOSE, CAPILLARY
GLUCOSE-CAPILLARY: 463 mg/dL — AB (ref 65–99)
GLUCOSE-CAPILLARY: 343 mg/dL — AB (ref 65–99)
Glucose-Capillary: 569 mg/dL (ref 65–99)
Glucose-Capillary: 275 mg/dL — ABNORMAL HIGH (ref 65–99)
Glucose-Capillary: 217 mg/dL — ABNORMAL HIGH (ref 65–99)
Glucose-Capillary: 204 mg/dL — ABNORMAL HIGH (ref 65–99)

## 2017-07-17 LAB — BLOOD GAS, VENOUS
Acid-base deficit: 23.8 mmol/L — ABNORMAL HIGH (ref 0.0–2.0)
Bicarbonate: 6.4 mmol/L — ABNORMAL LOW (ref 20.0–28.0)
FIO2: 0.21
O2 Saturation: 41.3 %
PATIENT TEMPERATURE: 37
PCO2 VEN: 26 mmHg — AB (ref 44.0–60.0)
PH VEN: 7 — AB (ref 7.250–7.430)
PO2 VEN: 38 mmHg (ref 32.0–45.0)

## 2017-07-17 LAB — LIPASE, BLOOD: Lipase: 187 U/L — ABNORMAL HIGH (ref 11–51)

## 2017-07-17 LAB — TROPONIN I

## 2017-07-17 LAB — MAGNESIUM: MAGNESIUM: 1.9 mg/dL (ref 1.7–2.4)

## 2017-07-17 LAB — MRSA PCR SCREENING: MRSA by PCR: NEGATIVE

## 2017-07-17 MED ORDER — ASPIRIN EC 81 MG PO TBEC
81.0000 mg | DELAYED_RELEASE_TABLET | Freq: Every day | ORAL | Status: DC
  Administered 2017-07-17 – 2017-07-20 (×4): 81 mg via ORAL
  Filled 2017-07-17 (×6): qty 1

## 2017-07-17 MED ORDER — AMOXICILLIN 875 MG PO TABS: 875.0000 mg | ORAL_TABLET | Freq: Two times a day (BID) | ORAL | Status: DC

## 2017-07-17 MED ORDER — SODIUM CHLORIDE 0.9 % IV SOLN
INTRAVENOUS | Status: DC
  Administered 2017-07-17: 20:00:00 via INTRAVENOUS

## 2017-07-17 MED ORDER — HEPARIN SODIUM (PORCINE) 5000 UNIT/ML IJ SOLN
5000.0000 [IU] | Freq: Three times a day (TID) | INTRAMUSCULAR | Status: DC
  Administered 2017-07-17 – 2017-07-18 (×2): 5000 [IU] via SUBCUTANEOUS
  Filled 2017-07-17 (×2): qty 1

## 2017-07-17 MED ORDER — ONDANSETRON HCL 4 MG/2ML IJ SOLN
4.0000 mg | Freq: Four times a day (QID) | INTRAMUSCULAR | Status: DC | PRN
  Administered 2017-07-18 – 2017-07-20 (×4): 4 mg via INTRAVENOUS
  Filled 2017-07-17 (×4): qty 2

## 2017-07-17 MED ORDER — MORPHINE SULFATE (PF) 4 MG/ML IV SOLN
4.0000 mg | Freq: Once | INTRAVENOUS | Status: AC
  Administered 2017-07-17: 4 mg via INTRAVENOUS
  Filled 2017-07-17: qty 1

## 2017-07-17 MED ORDER — SODIUM CHLORIDE 0.9 % IV SOLN: INTRAVENOUS | Status: AC

## 2017-07-17 MED ORDER — LORAZEPAM 0.5 MG PO TABS
0.5000 mg | ORAL_TABLET | Freq: Three times a day (TID) | ORAL | Status: DC | PRN
  Administered 2017-07-18 – 2017-07-20 (×5): 0.5 mg via ORAL
  Filled 2017-07-17 (×6): qty 1

## 2017-07-17 MED ORDER — DEXTROSE-NACL 5-0.45 % IV SOLN: INTRAVENOUS | Status: DC

## 2017-07-17 MED ORDER — ACETAMINOPHEN 325 MG PO TABS
650.0000 mg | ORAL_TABLET | Freq: Four times a day (QID) | ORAL | Status: DC | PRN
Start: 2017-07-17 — End: 2017-07-20

## 2017-07-17 MED ORDER — ONDANSETRON HCL 4 MG/2ML IJ SOLN
4.0000 mg | Freq: Once | INTRAMUSCULAR | Status: AC
  Administered 2017-07-17: 4 mg via INTRAVENOUS
  Filled 2017-07-17: qty 2

## 2017-07-17 MED ORDER — HYDROCODONE-ACETAMINOPHEN 5-325 MG PO TABS
1.0000 | ORAL_TABLET | Freq: Four times a day (QID) | ORAL | Status: DC | PRN
  Administered 2017-07-17 – 2017-07-18 (×3): 1 via ORAL
  Filled 2017-07-17 (×3): qty 1

## 2017-07-17 MED ORDER — AMOXICILLIN-POT CLAVULANATE 875-125 MG PO TABS
1.0000 | ORAL_TABLET | Freq: Two times a day (BID) | ORAL | Status: DC
  Administered 2017-07-17 – 2017-07-20 (×6): 1 via ORAL
  Filled 2017-07-17 (×7): qty 1

## 2017-07-17 MED ORDER — DEXTROSE-NACL 5-0.45 % IV SOLN
INTRAVENOUS | Status: DC
  Administered 2017-07-17 – 2017-07-18 (×2): via INTRAVENOUS

## 2017-07-17 MED ORDER — ACETAMINOPHEN 650 MG RE SUPP: 650.0000 mg | Freq: Four times a day (QID) | RECTAL | Status: DC | PRN

## 2017-07-17 MED ORDER — SODIUM CHLORIDE 0.9 % IV BOLUS (SEPSIS)
2000.0000 mL | Freq: Once | INTRAVENOUS | Status: AC
  Administered 2017-07-17: 2000 mL via INTRAVENOUS

## 2017-07-17 MED ORDER — ONDANSETRON HCL 4 MG PO TABS
4.0000 mg | ORAL_TABLET | Freq: Four times a day (QID) | ORAL | Status: DC | PRN
  Administered 2017-07-18 (×2): 4 mg via ORAL
  Filled 2017-07-17 (×2): qty 1

## 2017-07-17 MED ORDER — SODIUM CHLORIDE 0.9 % IV SOLN: INTRAVENOUS | Status: DC

## 2017-07-17 MED ORDER — SODIUM CHLORIDE 0.9 % IV SOLN
INTRAVENOUS | Status: DC
  Administered 2017-07-17: 4 [IU]/h via INTRAVENOUS
  Filled 2017-07-17: qty 1

## 2017-07-17 MED ORDER — INSULIN ASPART 100 UNIT/ML ~~LOC~~ SOLN
6.0000 [IU] | Freq: Once | SUBCUTANEOUS | Status: AC
  Administered 2017-07-17: 6 [IU] via INTRAVENOUS
  Filled 2017-07-17: qty 1

## 2017-07-17 MED ORDER — PANTOPRAZOLE SODIUM 40 MG PO TBEC
40.0000 mg | DELAYED_RELEASE_TABLET | Freq: Every day | ORAL | Status: DC
  Administered 2017-07-17 – 2017-07-20 (×4): 40 mg via ORAL
  Filled 2017-07-17 (×4): qty 1

## 2017-07-17 MED ORDER — ALBUTEROL SULFATE (2.5 MG/3ML) 0.083% IN NEBU: 3.0000 mL | INHALATION_SOLUTION | Freq: Four times a day (QID) | RESPIRATORY_TRACT | Status: DC | PRN

## 2017-07-17 MED ORDER — CITALOPRAM HYDROBROMIDE 20 MG PO TABS
20.0000 mg | ORAL_TABLET | Freq: Every day | ORAL | Status: DC
  Administered 2017-07-17 – 2017-07-20 (×4): 20 mg via ORAL
  Filled 2017-07-17 (×4): qty 1

## 2017-07-17 NOTE — Progress Notes (Signed)
eLink Physician-Brief Progress Note Patient Name: Renee Mack DOB: 1966/05/08 MRN: [id number]   Date of Service  07/17/2017  HPI/Events of Note  52 yo female with PMH of GERD, Hyperlipidemia, Pancreatitis, COPD, DM and bowel obstruction. Admitted with DKA. Now on an Insulin IV infusion. VSS. Patient has been seen in consultation by PCCM.   eICU Interventions  No new orders.     Intervention Category Evaluation Type: New Patient Evaluation  Lysle Dingwall 07/17/2017, 8:31 PM

## 2017-07-17 NOTE — ED Notes (Signed)
Unable to call report until 4033 per ED policy.

## 2017-07-17 NOTE — ED Triage Notes (Signed)
Pt presents via POV c/o vomiting since Friday. Reports dx with strep throat and started on antibiotic on Friday. Reports waking up with severe heart burn and emesis. Pt also reports hx DKA and pancreatitis.

## 2017-07-17 NOTE — ED Provider Notes (Addendum)
[facility] Emergency Department Provider Note  ____________________________________________  Time seen: Approximately 5:51 PM  I have reviewed the triage vital signs and the nursing notes.   HISTORY  Chief Complaint Emesis and Chest Pain    HPI Renee Mack is a 52 y.o. female who complains of nausea vomiting and generalized weakness for the past 2 days. Unable to tolerate oral intake. This started after being diagnosed with strep throat and started on an antibiotic and having a Decadron shot 2 days ago. She denies fever, no cough chest pain or shortness of breath. Symptoms are severe, constant, no aggravating or alleviating factors. Reports decreased urine output.     Past Medical History:  Diagnosis Date  . Cancer ([place])   . COPD (chronic obstructive pulmonary disease) ([place])   . Diabetes mellitus without complication ([place])   . GERD (gastroesophageal reflux disease)   . HLD (hyperlipidemia)   . Pancreatitis   . Shingles      Patient Active Problem List   Diagnosis Date Noted  . Acute ischemic enteritis ([place]) 06/12/2017  . Pyelonephritis 12/28/2014  . Type 2 diabetes mellitus ([place]) 12/28/2014  . COPD (chronic obstructive pulmonary disease) ([place]) 12/28/2014  . GERD (gastroesophageal reflux disease) 12/28/2014  . History of shingles 12/28/2014  . HLD (hyperlipidemia) 12/28/2014     Past Surgical History:  Procedure Laterality Date  . ABDOMINAL HYSTERECTOMY     partial  . ECTOPIC PREGNANCY SURGERY Left   . LYMPHADENECTOMY    . SPLENECTOMY, PARTIAL    . vulvulasectomy       Prior to Admission medications   Medication Sig Start Date End Date Taking? Authorizing Provider  acetaminophen (TYLENOL) 325 MG tablet Take 650 mg by mouth every 6 (six) hours as needed for mild pain.     [provider]  albuterol (PROAIR HFA) 108 (90 Base) MCG/ACT inhaler Inhale 1 puff into the lungs every 6 (six) hours as needed for wheezing or shortness  of breath.     [provider]  amoxicillin (AMOXIL) 875 MG tablet Take 1 tablet (875 mg total) by mouth 2 (two) times daily for 10 days. 07/14/17 07/24/17  Lannie Fields, PA-C  aspirin 81 MG tablet Take 81 mg by mouth daily.    [provider]  citalopram (CELEXA) 20 MG tablet Take 20 mg daily by mouth.    [provider]  esomeprazole (NEXIUM) 40 MG capsule Take 40 mg by mouth daily at 12 noon.    [provider]  insulin aspart (NOVOLOG) 100 UNIT/ML injection Inject 0-15 Units into the skin 4 (four) times daily - after meals and at bedtime. Patient taking differently: Inject 25 Units into the skin 4 (four) times daily - after meals and at bedtime.  06/15/17   Epifanio Lesches, MD  insulin glargine (LANTUS) 100 UNIT/ML injection Inject 0.27 mLs (27 Units total) into the skin 2 (two) times daily. Patient taking differently: Inject 50 Units into the skin 2 (two) times daily.  06/15/17   Epifanio Lesches, MD  LORazepam (ATIVAN) 0.5 MG tablet Take 1 tablet (0.5 mg total) by mouth every 8 (eight) hours as needed for anxiety. 02/06/17 02/06/18  Lisa Roca, MD  metoCLOPramide (REGLAN) 10 MG tablet Take 1 tablet (10 mg total) by mouth every 8 (eight) hours as needed. 07/04/17   Loney Hering, MD     Allergies Metformin and related; Darvon [propoxyphene]; Gabapentin; Nsaids; and Tramadol   Family History  Problem Relation Age of Onset  .  Cancer Mother   . Osteoporosis Sister   . Heart disease Brother     Social History Social History   Tobacco Use  . Smoking status: Current Every Day Smoker    Packs/day: 1.00    Types: Cigarettes  . Smokeless tobacco: Never Used  Substance Use Topics  . Alcohol use: No  . Drug use: No    Review of Systems  Constitutional:   No fever or chills.  ENT:   Positive sore throat. No rhinorrhea. Cardiovascular:   No chest pain or syncope. Respiratory:   No dyspnea or cough. Gastrointestinal:   Negative for  abdominal pain, positive vomiting  Musculoskeletal:   Negative for focal pain or swelling All other systems reviewed and are negative except as documented above in ROS and HPI.  ____________________________________________   PHYSICAL EXAM:  VITAL SIGNS: ED Triage Vitals  Enc Vitals Group     BP 07/17/17 1553 108/75     Pulse Rate 07/17/17 1553 (!) 129     Resp 07/17/17 1553 20     Temp 07/17/17 1553 99 F (37.2 C)     Temp Source 07/17/17 1553 Oral     SpO2 07/17/17 1553 98 %     Weight 07/17/17 1711 133 lb (60.3 kg)     Height 07/17/17 1711 5\' 1"  (1.549 m)     Head Circumference --      Peak Flow --      Pain Score 07/17/17 1616 10     Pain Loc --      Pain Edu? --      Excl. in [place]? --     Vital signs reviewed, nursing assessments reviewed.   Constitutional:   Alert and oriented. Ill appearing, not in distress Eyes:   No scleral icterus.  EOMI. No nystagmus. No conjunctival pallor. PERRL. ENT   Head:   Normocephalic and atraumatic.   Nose:   No congestion/rhinnorhea.    Mouth/Throat:   Dry mucous membranes, no pharyngeal erythema. No peritonsillar mass.    Neck:   No meningismus. Full ROM. Hematological/Lymphatic/Immunilogical:   No cervical lymphadenopathy. Cardiovascular:   Tachycardia heart rate 120. Symmetric bilateral radial and DP pulses.  No murmurs.  Respiratory:   Normal respiratory effort without tachypnea/retractions. Breath sounds are clear and equal bilaterally. No wheezes/rales/rhonchi. Gastrointestinal:   Soft and nontender. Non distended. There is no CVA tenderness.  No rebound, rigidity, or guarding. Genitourinary:   deferred Musculoskeletal:   Normal range of motion in all extremities. No joint effusions.  No lower extremity tenderness.  No edema. Neurologic:   Normal speech and language.  Motor grossly intact. No acute focal neurologic deficits are appreciated.  Skin:    Skin is warm, dry and intact. No rash noted.  No petechiae,  purpura, or bullae.  ____________________________________________    LABS (pertinent positives/negatives) (all labs ordered are listed, but only abnormal results are displayed) Labs Reviewed  GLUCOSE, CAPILLARY - Abnormal; Notable for the following components:      Result Value   Glucose-Capillary 569 (*)    All other components within normal limits  CBC - Abnormal; Notable for the following components:   WBC 14.4 (*)    RBC 5.52 (*)    Hemoglobin 18.1 (*)    HCT 56.8 (*)    MCV 102.8 (*)    MCHC 31.9 (*)    All other components within normal limits  BASIC METABOLIC PANEL - Abnormal; Notable for the following components:   Sodium 123 (*)  Potassium 6.1 (*)    Chloride 92 (*)    CO2 <7 (*)    Glucose, Bld 575 (*)    BUN 36 (*)    Creatinine, Ser 1.34 (*)    GFR calc non Af Amer 44 (*)    GFR calc Af Amer 51 (*)    All other components within normal limits  BLOOD GAS, VENOUS - Abnormal; Notable for the following components:   pH, Ven 7.00 (*)    pCO2, Ven 26 (*)    Bicarbonate 6.4 (*)    Acid-base deficit 23.8 (*)    All other components within normal limits  URINE CULTURE  TROPONIN I  LIPASE, BLOOD  URINALYSIS, COMPLETE (UACMP) WITH MICROSCOPIC   ____________________________________________   EKG  Interpreted by me Sinus tachycardia rate 123, normal axis. Prolonged QTC at 5:30. Normal QRS ST segments and T waves.  ____________________________________________    RADIOLOGY  Dg Chest 2 View  Result Date: 07/17/2017 CLINICAL DATA:  Nausea, vomiting, history smoking, pancreatitis, COPD, diabetes mellitus, vulvar cancer EXAM: CHEST  2 VIEW COMPARISON:  02/06/2017 FINDINGS: Normal heart size, mediastinal contours, and pulmonary vascularity. Atherosclerotic calcification aorta. Lungs clear. No acute infiltrate, pleural effusion or pneumothorax. Bones demineralized. No acute abnormalities. IMPRESSION: No active cardiopulmonary disease. Electronically Signed   By: Lavonia Dana M.D.   On: 07/17/2017 16:59    ____________________________________________   PROCEDURES .Critical Care Performed by: Carrie Mew, MD Authorized by: Carrie Mew, MD   Critical care provider statement:    Critical care time (minutes):  35   Critical care time was exclusive of:  Separately billable procedures and treating other patients   Critical care was necessary to treat or prevent imminent or life-threatening deterioration of the following conditions:  Circulatory failure, shock and endocrine crisis   Critical care was time spent personally by me on the following activities:  Development of treatment plan with patient or surrogate, discussions with consultants, evaluation of patient's response to treatment, examination of patient, obtaining history from patient or surrogate, ordering and performing treatments and interventions, ordering and review of laboratory studies, ordering and review of radiographic studies, pulse oximetry, re-evaluation of patient's condition and review of old charts     ____________________________________________    CLINICAL IMPRESSION / [place] / ED COURSE  Pertinent labs & imaging results that were available during my care of the patient were reviewed by me and considered in my medical decision making (see chart for details).     Clinical Course as of Jul 18 2343  Nancy Fetter Jul 17, 2017  1714 Hyperglycemia, tachcardia after decadron use for sore throat. Concern for DKA. Will check vbg, UA. Pt reports high ketones on home test. Will start IVF bolus, zofran, pain control.   [PS]  1745 Ph 7.00. Co2 <7. Anion gap at least 24. Severe DKA. Continue IVF. Start insulin with small bolus + drip to help manage hyperkalemia. Will admit to ICU.   [PS]  6644 Labs with multiple severe findings including hyperkalemia, hyponatremia. Acidosis. Leukocytosis.   [PS]    Clinical Course User Index [PS] Carrie Mew, MD      ----------------------------------------- 5:59 PM on 07/17/2017 -----------------------------------------  Discussed with hospitalist for further management.  ____________________________________________   FINAL CLINICAL IMPRESSION(S) / ED DIAGNOSES    Final diagnoses:  Diabetic ketoacidosis without coma associated with other specified diabetes mellitus ([place])  Hyperglycemia       Portions of this note were generated with dragon dictation software. Dictation errors may  occur despite best attempts at proofreading.    Carrie Mew, MD 07/17/17 1759    Carrie Mew, MD 07/17/17 [phone]

## 2017-07-17 NOTE — H&P (Signed)
[place] [facility] [place] NAME: Renee Mack    MR#:  [id number]  DATE OF BIRTH:  1966/04/01  DATE OF ADMISSION:  07/17/2017  PRIMARY CARE PHYSICIAN: Sherlyn Lees, FNP   REQUESTING/REFERRING PHYSICIAN: Dr. Brenton Grills  CHIEF COMPLAINT:   Chief Complaint  Patient presents with  . Emesis  . Chest Pain    HISTORY OF PRESENT ILLNESS:  Renee Mack  is a 52 y.o. female with a known history of COPD, diabetes, GERD, hyperlipidemia, previous history of pancreatitis who presents to the hospital due to intractable nausea vomiting and some vague chest pain. She was Sr. in the emergency room 2 days ago complaining of some nausea vomiting and sore throat. She was thought to have acute pharyngitis and discharge on some amoxicillin and prednisone. She woke up on Friday and started having intractable nausea vomiting with uncontrolled blood sugars based on her glucometer. She presented to the emergency room as she was not improving and was noted to be in acute diabetic ketoacidosis. Hospitalist services were contacted further treatment and evaluation. Patient does state that she is compliant with her insulin.  PAST MEDICAL HISTORY:   Past Medical History:  Diagnosis Date  . Cancer ([place])   . COPD (chronic obstructive pulmonary disease) ([place])   . Diabetes mellitus without complication ([place])   . GERD (gastroesophageal reflux disease)   . HLD (hyperlipidemia)   . Pancreatitis   . Shingles     PAST SURGICAL HISTORY:   Past Surgical History:  Procedure Laterality Date  . ABDOMINAL HYSTERECTOMY     partial  . ECTOPIC PREGNANCY SURGERY Left   . LYMPHADENECTOMY    . SPLENECTOMY, PARTIAL    . vulvulasectomy      SOCIAL HISTORY:   Social History   Tobacco Use  . Smoking status: Current Every Day Smoker    Packs/day: 1.00    Years: 30.00    Pack years: 30.00    Types: Cigarettes  . Smokeless tobacco: Never Used  Substance Use Topics  . Alcohol  use: No    FAMILY HISTORY:   Family History  Problem Relation Age of Onset  . Cancer Mother   . Osteoporosis Sister   . Heart disease Brother   . Heart attack Father     DRUG ALLERGIES:   Allergies  Allergen Reactions  . Metformin And Related Shortness Of Breath  . Darvon [Propoxyphene] Itching  . Gabapentin Swelling  . Nsaids Other (See Comments)    Ulcers   . Tramadol Hives    REVIEW OF SYSTEMS:   Review of Systems  Constitutional: Negative for fever and weight loss.  HENT: Negative for congestion, nosebleeds and tinnitus.   Eyes: Negative for blurred vision, double vision and redness.  Respiratory: Negative for cough, hemoptysis and shortness of breath.   Cardiovascular: Positive for chest pain. Negative for orthopnea, leg swelling and PND.  Gastrointestinal: Positive for nausea and vomiting. Negative for abdominal pain, diarrhea and melena.  Genitourinary: Negative for dysuria, hematuria and urgency.  Musculoskeletal: Negative for falls and joint pain.  Neurological: Negative for dizziness, tingling, sensory change, focal weakness, seizures, weakness and headaches.  Endo/Heme/Allergies: Negative for polydipsia. Does not bruise/bleed easily.  Psychiatric/Behavioral: Negative for depression and memory loss. The patient is not nervous/anxious.     MEDICATIONS AT HOME:   Prior to Admission medications   Medication Sig Start Date End Date Taking? Authorizing Provider  acetaminophen (TYLENOL) 325 MG tablet Take 650 mg by mouth every  6 (six) hours as needed for mild pain.     [provider]  albuterol (PROAIR HFA) 108 (90 Base) MCG/ACT inhaler Inhale 1 puff into the lungs every 6 (six) hours as needed for wheezing or shortness of breath.     [provider]  amoxicillin (AMOXIL) 875 MG tablet Take 1 tablet (875 mg total) by mouth 2 (two) times daily for 10 days. 07/14/17 07/24/17  Lannie Fields, PA-C  aspirin 81 MG tablet Take 81 mg by mouth daily.     [provider]  citalopram (CELEXA) 20 MG tablet Take 20 mg daily by mouth.    [provider]  esomeprazole (NEXIUM) 40 MG capsule Take 40 mg by mouth daily at 12 noon.    [provider]  insulin aspart (NOVOLOG) 100 UNIT/ML injection Inject 0-15 Units into the skin 4 (four) times daily - after meals and at bedtime. Patient taking differently: Inject 25 Units into the skin 4 (four) times daily - after meals and at bedtime.  06/15/17   Epifanio Lesches, MD  insulin glargine (LANTUS) 100 UNIT/ML injection Inject 0.27 mLs (27 Units total) into the skin 2 (two) times daily. Patient taking differently: Inject 50 Units into the skin 2 (two) times daily.  06/15/17   Epifanio Lesches, MD  LORazepam (ATIVAN) 0.5 MG tablet Take 1 tablet (0.5 mg total) by mouth every 8 (eight) hours as needed for anxiety. 02/06/17 02/06/18  Lisa Roca, MD  metoCLOPramide (REGLAN) 10 MG tablet Take 1 tablet (10 mg total) by mouth every 8 (eight) hours as needed. 07/04/17   Loney Hering, MD      VITAL SIGNS:  Blood pressure 108/75, pulse (!) 129, temperature 99 F (37.2 C), temperature source Oral, resp. rate 20, height 5\' 1"  (1.549 m), weight 60.3 kg (133 lb), SpO2 98 %.  PHYSICAL EXAMINATION:  Physical Exam  GENERAL:  52 y.o.-year-old patient lying in the bed in NAD.  EYES: Pupils equal, round, reactive to light and accommodation. No scleral icterus. Extraocular muscles intact.  HEENT: Head atraumatic, normocephalic. Oropharynx and nasopharynx clear. No oropharyngeal erythema, dry oral mucosa.  Poor Dentition.  NECK:  Supple, no jugular venous distention. No thyroid enlargement, no tenderness.  LUNGS: Normal breath sounds bilaterally, no wheezing, rales, rhonchi. No use of accessory muscles of respiration.  CARDIOVASCULAR: S1, S2 RRR. No murmurs, rubs, gallops, clicks.  ABDOMEN: Soft, nontender, nondistended. Bowel sounds present. No organomegaly or mass.  EXTREMITIES: No  pedal edema, cyanosis, or clubbing. + 2 pedal & radial pulses b/l.   NEUROLOGIC: Cranial nerves II through XII are intact. No focal Motor or sensory deficits appreciated b/l PSYCHIATRIC: The patient is alert and oriented x 3. SKIN: No obvious rash, lesion, or ulcer.   LABORATORY PANEL:   CBC Recent Labs  Lab 07/17/17 1617  WBC 14.4*  HGB 18.1*  HCT 56.8*  PLT 349   ------------------------------------------------------------------------------------------------------------------  Chemistries  Recent Labs  Lab 07/17/17 1617  NA 123*  K 6.1*  CL 92*  CO2 <7*  GLUCOSE 575*  BUN 36*  CREATININE 1.34*  CALCIUM 10.2   ------------------------------------------------------------------------------------------------------------------  Cardiac Enzymes Recent Labs  Lab 07/17/17 1617  TROPONINI <0.03   ------------------------------------------------------------------------------------------------------------------  RADIOLOGY:  Dg Chest 2 View  Result Date: 07/17/2017 CLINICAL DATA:  Nausea, vomiting, history smoking, pancreatitis, COPD, diabetes mellitus, vulvar cancer EXAM: CHEST  2 VIEW COMPARISON:  02/06/2017 FINDINGS: Normal heart size, mediastinal contours, and pulmonary vascularity. Atherosclerotic calcification aorta. Lungs clear. No acute infiltrate, pleural effusion  or pneumothorax. Bones demineralized. No acute abnormalities. IMPRESSION: No active cardiopulmonary disease. Electronically Signed   By: Lavonia Dana M.D.   On: 07/17/2017 16:59     IMPRESSION AND PLAN:   52 yo female with past medical history of COPD with ongoing tobacco abuse, diabetes, GERD, hyperlipidemia, previous history of pancreatitis presents to the hospital due to intractable nausea vomiting and noted to be in acute diabetic ketoacidosis.  1. Acute diabetic ketoacidosis-etiology unclear. Patient was just treating the ER for suspected acute pharyngitis and started on some prednisone and  amoxicillin. Unclear if that is the source or the patient is not compliant with her insulin. -We'll place the patient on IV fluids, insulin drip per DKA protocol, follow serial metabolic profiles. Replace electrolytes accordingly.  2. Hyponatremia-this is pseudohyponatremia secondary to severe hypoglycemia -Follow sodium as blood sugars correct.  3. Acute kidney injury-secondary to the acute DKA and dehydration. Continue IV fluids, follow BUN and creatinine and urine output.  4. Hyperkalemia-secondary to the acute kidney injury and acidosis from DKA. -Should improve with insulin drip and IV fluids and will follow potassium level.  5. Depression-continue Celexa.  6. COPD-no acute exacerbation.  7. GERD-continue Protonix.    All the records are reviewed and case discussed with ED provider. Management plans discussed with the patient, family and they are in agreement.  CODE STATUS: Full code  TOTAL Critical Care TIME TAKING CARE OF THIS PATIENT: 45 minutes.    Henreitta Leber M.D on 07/17/2017 at 6:15 PM  Between 7am to 6pm - Pager - [phone]  After 6pm go to [email or web address] - password [email or web address] [facility]  Office  [phone]  CC: Primary care physician; Sherlyn Lees, [place]

## 2017-07-17 NOTE — Consult Note (Signed)
[facility] Consultation   ASSESSMENT/PLAN   DKA. Patient is being admitted to the intensive care unit on DKA protocol, IV insulin with fluid resuscitation  Acid-based derangement. Patient with anion gap calculated at minimum of 24, delta gap of 12, calculated starting bicarbonate of 19 consistent with a non-anion gap and anion gap metabolic acidosis most likely secondary to ketoacidosis from diabetes. Arterial blood gas not obtained for complete respiratory compensation interpretation  Hyperkalemia. Pending EKG, no hyperacute T waves noted on monitor, potassium should come down quickly with saline resuscitation, IV insulin and correcting acid-base status. Will follow-up potassium magnesium and phosphorus as per protocol  Hyponatremia. Hyponatremia will improve with decreasing blood sugar  Leukocytosis. White count is 14.4, has recently been on steroids, no clear evidence of infection  History of COPD. Diminished breath sounds but not actively bronchospastic  Name: Renee Mack MRN: [id number] DOB: [birth year]    ADMISSION DATE:  07/17/2017 CONSULTATION DATE:  07/17/2017  REFERRING MD :  Hospitalist Service  CHIEF COMPLAINT:  Stomach Pain   HISTORY OF PRESENT ILLNESS:  Renee Mack is a 52 year old female with a past medical history remarkable for gastroesophageal reflux disease hyperlipidemia, pancreatitis, COPD, diabetes, prior history of bowel obstruction, was recently treated for strep throat, received amoxicillin and Decadron, began developing abdominal discomfort and pain with nausea and vomiting along with vague chest discomfort. Presented to the emergency room and found to be in DKA. Pertinent labs in the emergency department reveal a pH of 7.0, blood glucose of 575, potassium of 6.1, CO2 less than 7, sodium 123, calculated anion gap at minimum 24, white count 14.4. She states that she has not missed any insulin, she generally takes 25-50 units of Lantus twice  daily, she also takes 25 units of NovoLog with meals  PAST MEDICAL HISTORY :  Past Medical History:  Diagnosis Date  . Cancer ([place])   . COPD (chronic obstructive pulmonary disease) ([place])   . Diabetes mellitus without complication ([place])   . GERD (gastroesophageal reflux disease)   . HLD (hyperlipidemia)   . Pancreatitis   . Shingles    Past Surgical History:  Procedure Laterality Date  . ABDOMINAL HYSTERECTOMY     partial  . ECTOPIC PREGNANCY SURGERY Left   . LYMPHADENECTOMY    . SPLENECTOMY, PARTIAL    . vulvulasectomy     Prior to Admission medications   Medication Sig Start Date End Date Taking? Authorizing Provider  acetaminophen (TYLENOL) 325 MG tablet Take 650 mg by mouth every 6 (six) hours as needed for mild pain.     [provider]  albuterol (PROAIR HFA) 108 (90 Base) MCG/ACT inhaler Inhale 1 puff into the lungs every 6 (six) hours as needed for wheezing or shortness of breath.     [provider]  amoxicillin (AMOXIL) 875 MG tablet Take 1 tablet (875 mg total) by mouth 2 (two) times daily for 10 days. 07/14/17 07/24/17  Lannie Fields, PA-C  aspirin 81 MG tablet Take 81 mg by mouth daily.    [provider]  citalopram (CELEXA) 20 MG tablet Take 20 mg daily by mouth.    [provider]  esomeprazole (NEXIUM) 40 MG capsule Take 40 mg by mouth daily at 12 noon.    [provider]  insulin aspart (NOVOLOG) 100 UNIT/ML injection Inject 0-15 Units into the skin 4 (four) times daily - after meals and at bedtime. Patient taking differently: Inject 25 Units into the skin 4 (four) times  daily - after meals and at bedtime.  06/15/17   Epifanio Lesches, MD  insulin glargine (LANTUS) 100 UNIT/ML injection Inject 0.27 mLs (27 Units total) into the skin 2 (two) times daily. Patient taking differently: Inject 50 Units into the skin 2 (two) times daily.  06/15/17   Epifanio Lesches, MD  LORazepam (ATIVAN) 0.5 MG tablet Take 1 tablet (0.5  mg total) by mouth every 8 (eight) hours as needed for anxiety. 02/06/17 02/06/18  Lisa Roca, MD  metoCLOPramide (REGLAN) 10 MG tablet Take 1 tablet (10 mg total) by mouth every 8 (eight) hours as needed. 07/04/17   Loney Hering, MD   Allergies  Allergen Reactions  . Metformin And Related Shortness Of Breath  . Darvon [Propoxyphene] Itching  . Gabapentin Swelling  . Nsaids Other (See Comments)    Ulcers   . Tramadol Hives    FAMILY HISTORY:  Family History  Problem Relation Age of Onset  . Cancer Mother   . Osteoporosis Sister   . Heart disease Brother   . Heart attack Father    SOCIAL HISTORY:  reports that she has been smoking cigarettes.  She has a 30.00 pack-year smoking history. she has never used smokeless tobacco. She reports that she does not drink alcohol or use drugs.  REVIEW OF SYSTEMS:    The remainder of systems were reviewed and were found to be negative other than what is documented in the HPI.    VITAL SIGNS: Temp:  [99 F (37.2 C)] 99 F (37.2 C) (01/27 1553) Pulse Rate:  [129] 129 (01/27 1553) Resp:  [20] 20 (01/27 1553) BP: (108)/(75) 108/75 (01/27 1553) SpO2:  [98 %] 98 % (01/27 1553) Weight:  [133 lb (60.3 kg)] 133 lb (60.3 kg) (01/27 1711) I Physical Examination:   VS: BP 108/75   Pulse (!) 129   Temp 99 F (37.2 C) (Oral)   Resp 20   Ht 5\' 1"  (1.549 m)   Wt 133 lb (60.3 kg)   SpO2 98%   BMI 25.13 kg/m   General Appearance: Patient is awake, alert and communicating but is generally uncomfortable complaining of epigastric pain Neuro:without focal findings, mental status, speech normal,. HEENT: PERRLA, EOM intact, no ptosis,  predominantly edentulous with poor residual dentition  Pulmonary: normal breath sounds., diaphragmatic excursion normal. CardiovascularNormal S1,S2.  No m/r/g.    Abdomen: soft exam, bowel sounds appreciated, epigastric pain to palpation  Skin:   warm, no rashes, no ecchymosis  Extremities:  dry tenting of the  skin appears line depleted    LABS: Reviewed   LABORATORY PANEL:   CBC Recent Labs  Lab 07/17/17 1617  WBC 14.4*  HGB 18.1*  HCT 56.8*  PLT 349    Chemistries  Recent Labs  Lab 07/17/17 1617  NA 123*  K 6.1*  CL 92*  CO2 <7*  GLUCOSE 575*  BUN 36*  CREATININE 1.34*  CALCIUM 10.2    Recent Labs  Lab 07/17/17 1608  GLUCAP 569*   No results for input(s): PHART, PCO2ART, PO2ART in the last 168 hours. No results for input(s): AST, ALT, ALKPHOS, BILITOT, ALBUMIN in the last 168 hours.  Cardiac Enzymes Recent Labs  Lab 07/17/17 1617  TROPONINI <0.03    RADIOLOGY:  Dg Chest 2 View  Result Date: 07/17/2017 CLINICAL DATA:  Nausea, vomiting, history smoking, pancreatitis, COPD, diabetes mellitus, vulvar cancer EXAM: CHEST  2 VIEW COMPARISON:  02/06/2017 FINDINGS: Normal heart size, mediastinal contours, and pulmonary vascularity. Atherosclerotic calcification aorta. Lungs  clear. No acute infiltrate, pleural effusion or pneumothorax. Bones demineralized. No acute abnormalities. IMPRESSION: No active cardiopulmonary disease. Electronically Signed   By: Lavonia Dana M.D.   On: 07/17/2017 16:59     Hermelinda Dellen, DO   07/17/2017, 6:27 PM

## 2017-07-17 NOTE — ED Notes (Signed)
Date and time results received: 07/17/17 1704 (use smartphrase ".now" to insert current time)  Test: Glucose Critical Value: 575  Name of Provider Notified: Dr Joni Fears  Orders Received? Or Actions Taken?: Notified

## 2017-07-18 DIAGNOSIS — K861 Other chronic pancreatitis: Secondary | ICD-10-CM

## 2017-07-18 DIAGNOSIS — E1010 Type 1 diabetes mellitus with ketoacidosis without coma: Secondary | ICD-10-CM

## 2017-07-18 DIAGNOSIS — E43 Unspecified severe protein-calorie malnutrition: Secondary | ICD-10-CM

## 2017-07-18 DIAGNOSIS — R1084 Generalized abdominal pain: Secondary | ICD-10-CM

## 2017-07-18 LAB — CBC
HCT: 44.1 % (ref 35.0–47.0)
Hemoglobin: 14.9 g/dL (ref 12.0–16.0)
MCH: 32.7 pg (ref 26.0–34.0)
MCHC: 33.7 g/dL (ref 32.0–36.0)
MCV: 97.1 fL (ref 80.0–100.0)
PLATELETS: 276 10*3/uL (ref 150–440)
RBC: 4.54 MIL/uL (ref 3.80–5.20)
RDW: 12.8 % (ref 11.5–14.5)
WBC: 12.9 10*3/uL — AB (ref 3.6–11.0)

## 2017-07-18 LAB — GLUCOSE, CAPILLARY
GLUCOSE-CAPILLARY: 194 mg/dL — AB (ref 65–99)
GLUCOSE-CAPILLARY: 116 mg/dL — AB (ref 65–99)
GLUCOSE-CAPILLARY: 159 mg/dL — AB (ref 65–99)
GLUCOSE-CAPILLARY: 176 mg/dL — AB (ref 65–99)
GLUCOSE-CAPILLARY: 154 mg/dL — AB (ref 65–99)
Glucose-Capillary: 120 mg/dL — ABNORMAL HIGH (ref 65–99)
Glucose-Capillary: 129 mg/dL — ABNORMAL HIGH (ref 65–99)
Glucose-Capillary: 153 mg/dL — ABNORMAL HIGH (ref 65–99)
Glucose-Capillary: 163 mg/dL — ABNORMAL HIGH (ref 65–99)
Glucose-Capillary: 178 mg/dL — ABNORMAL HIGH (ref 65–99)
Glucose-Capillary: 179 mg/dL — ABNORMAL HIGH (ref 65–99)
Glucose-Capillary: 184 mg/dL — ABNORMAL HIGH (ref 65–99)
Glucose-Capillary: 195 mg/dL — ABNORMAL HIGH (ref 65–99)
Glucose-Capillary: 343 mg/dL — ABNORMAL HIGH (ref 65–99)

## 2017-07-18 LAB — BASIC METABOLIC PANEL
ANION GAP: 7 (ref 5–15)
ANION GAP: 7 (ref 5–15)
Anion gap: 8 (ref 5–15)
BUN: 36 mg/dL — AB (ref 6–20)
BUN: 20 mg/dL (ref 6–20)
BUN: 21 mg/dL — AB (ref 6–20)
BUN: 25 mg/dL — AB (ref 6–20)
CALCIUM: 8.6 mg/dL — AB (ref 8.9–10.3)
CALCIUM: 8.8 mg/dL — AB (ref 8.9–10.3)
CALCIUM: 9 mg/dL (ref 8.9–10.3)
CHLORIDE: 92 mmol/L — AB (ref 101–111)
CO2: 14 mmol/L — ABNORMAL LOW (ref 22–32)
CO2: 17 mmol/L — ABNORMAL LOW (ref 22–32)
CO2: 19 mmol/L — ABNORMAL LOW (ref 22–32)
CREATININE: 0.62 mg/dL (ref 0.44–1.00)
Calcium: 10.2 mg/dL (ref 8.9–10.3)
Chloride: 103 mmol/L (ref 101–111)
Chloride: 104 mmol/L (ref 101–111)
Chloride: 106 mmol/L (ref 101–111)
Creatinine, Ser: 0.62 mg/dL (ref 0.44–1.00)
Creatinine, Ser: 0.69 mg/dL (ref 0.44–1.00)
Creatinine, Ser: 1.34 mg/dL — ABNORMAL HIGH (ref 0.44–1.00)
GFR calc Af Amer: >60 mL/min (ref >60)
GFR calc Af Amer: >60 mL/min (ref >60)
GFR calc non Af Amer: 44 mL/min — ABNORMAL LOW (ref >60)
GFR calc non Af Amer: >60 mL/min (ref >60)
GFR calc non Af Amer: >60 mL/min (ref >60)
GFR calc non Af Amer: >60 mL/min (ref >60)
GFR, EST AFRICAN AMERICAN: 51 mL/min — AB (ref >60)
GLUCOSE: 156 mg/dL — AB (ref 65–99)
GLUCOSE: 200 mg/dL — AB (ref 65–99)
Glucose, Bld: 575 mg/dL (ref 65–99)
Glucose, Bld: 121 mg/dL — ABNORMAL HIGH (ref 65–99)
POTASSIUM: 6.1 mmol/L — AB (ref 3.5–5.1)
POTASSIUM: 3.8 mmol/L (ref 3.5–5.1)
Potassium: 3.7 mmol/L (ref 3.5–5.1)
Potassium: 4.1 mmol/L (ref 3.5–5.1)
SODIUM: 127 mmol/L — AB (ref 135–145)
SODIUM: 129 mmol/L — AB (ref 135–145)
SODIUM: 129 mmol/L — AB (ref 135–145)
Sodium: 123 mmol/L — ABNORMAL LOW (ref 135–145)

## 2017-07-18 LAB — PHOSPHORUS: Phosphorus: 1.6 mg/dL — ABNORMAL LOW (ref 2.5–4.6)

## 2017-07-18 LAB — MAGNESIUM: Magnesium: 1.9 mg/dL (ref 1.7–2.4)

## 2017-07-18 MED ORDER — INSULIN ASPART 100 UNIT/ML ~~LOC~~ SOLN
4.0000 [IU] | Freq: Three times a day (TID) | SUBCUTANEOUS | Status: DC
  Administered 2017-07-18 – 2017-07-20 (×6): 4 [IU] via SUBCUTANEOUS
  Filled 2017-07-18 (×3): qty 1

## 2017-07-18 MED ORDER — INSULIN ASPART 100 UNIT/ML ~~LOC~~ SOLN: 0.0000 [IU] | Freq: Every day | SUBCUTANEOUS | Status: DC

## 2017-07-18 MED ORDER — INSULIN ASPART 100 UNIT/ML ~~LOC~~ SOLN
0.0000 [IU] | Freq: Three times a day (TID) | SUBCUTANEOUS | Status: DC
  Administered 2017-07-18: 15 [IU] via SUBCUTANEOUS
  Administered 2017-07-19: 4 [IU] via SUBCUTANEOUS
  Administered 2017-07-19: 11 [IU] via SUBCUTANEOUS
  Administered 2017-07-19: 3 [IU] via SUBCUTANEOUS
  Administered 2017-07-20 (×2): 4 [IU] via SUBCUTANEOUS
  Filled 2017-07-18 (×5): qty 1

## 2017-07-18 MED ORDER — HYDROCODONE-ACETAMINOPHEN 5-325 MG PO TABS
1.0000 | ORAL_TABLET | ORAL | Status: DC | PRN
  Administered 2017-07-18 – 2017-07-19 (×6): 1 via ORAL
  Filled 2017-07-18 (×7): qty 1

## 2017-07-18 MED ORDER — INSULIN GLARGINE 100 UNIT/ML ~~LOC~~ SOLN
27.0000 [IU] | Freq: Every day | SUBCUTANEOUS | Status: DC
  Administered 2017-07-18 – 2017-07-20 (×3): 27 [IU] via SUBCUTANEOUS
  Filled 2017-07-18 (×5): qty 0.27

## 2017-07-18 MED ORDER — INSULIN ASPART 100 UNIT/ML ~~LOC~~ SOLN
0.0000 [IU] | Freq: Three times a day (TID) | SUBCUTANEOUS | Status: DC
  Administered 2017-07-18: 3 [IU] via SUBCUTANEOUS
  Filled 2017-07-18: qty 1

## 2017-07-18 MED ORDER — K PHOS MONO-SOD PHOS DI & MONO 155-852-130 MG PO TABS
1000.0000 mg | ORAL_TABLET | Freq: Once | ORAL | Status: AC
  Administered 2017-07-18: 1000 mg via ORAL
  Filled 2017-07-18: qty 4

## 2017-07-18 MED ORDER — ENOXAPARIN SODIUM 40 MG/0.4ML ~~LOC~~ SOLN
40.0000 mg | SUBCUTANEOUS | Status: DC
  Administered 2017-07-18 – 2017-07-19 (×2): 40 mg via SUBCUTANEOUS
  Filled 2017-07-18 (×2): qty 0.4

## 2017-07-18 NOTE — Care Management (Addendum)
RNCM met with patient as she is listed as self-pay.  She agrees with referral to [facility].  Application delivered and referral sent. Referral also to [facility].  RNCM will follow. [facility] can usually assist patient on Humulin and Levamir insulins (not Lantus).

## 2017-07-18 NOTE — Progress Notes (Signed)
[place] at [place] NAME: Renee Mack    MR#:  [id number]  DATE OF BIRTH:  March 13, 1966  SUBJECTIVE:  CHIEF COMPLAINT:   Chief Complaint  Patient presents with  . Emesis  . Chest Pain   -Patient with type 2 diabetes admitted with ketoacidosis. States that she has been compliant with her insulin. -Complains of significant abdominal pain  REVIEW OF SYSTEMS:  Review of Systems  Constitutional: Negative for chills, fever and malaise/fatigue.  HENT: Positive for sore throat. Negative for congestion, ear discharge, hearing loss and nosebleeds.   Eyes: Negative for blurred vision and double vision.  Respiratory: Negative for cough, shortness of breath and wheezing.   Cardiovascular: Negative for chest pain, palpitations and leg swelling.  Gastrointestinal: Positive for abdominal pain and nausea. Negative for constipation, diarrhea and vomiting.  Genitourinary: Negative for dysuria and urgency.  Musculoskeletal: Positive for myalgias.  Neurological: Negative for dizziness, speech change, focal weakness, seizures and headaches.  Psychiatric/Behavioral: Negative for depression.    DRUG ALLERGIES:   Allergies  Allergen Reactions  . Metformin And Related Shortness Of Breath  . Darvon [Propoxyphene] Itching  . Gabapentin Swelling  . Nsaids Other (See Comments)    Ulcers   . Tramadol Hives    VITALS:  Blood pressure 98/60, pulse 78, temperature (!) 97.1 F (36.2 C), temperature source Axillary, resp. rate 17, height 5\' 1"  (1.549 m), weight 57.2 kg (126 lb 1.7 oz), SpO2 98 %.  PHYSICAL EXAMINATION:  Physical Exam  GENERAL:  52 y.o.-year-old patient lying in the bed with no acute distress.  EYES: Pupils equal, round, reactive to light and accommodation. No scleral icterus. Extraocular muscles intact.  HEENT: Head atraumatic, normocephalic. Oropharynx and nasopharynx clear.  NECK:  Supple, no jugular venous distention. No thyroid  enlargement, no tenderness.  LUNGS: Normal breath sounds bilaterally, no wheezing, rales,rhonchi or crepitation. No use of accessory muscles of respiration.  CARDIOVASCULAR: S1, S2 normal. No murmurs, rubs, or gallops.  ABDOMEN: Soft, diffuse tenderness with voluntary guarding, no rigidity or rebound tenderness, nondistended. Bowel sounds present. No organomegaly or mass.  EXTREMITIES: No pedal edema, cyanosis, or clubbing.  NEUROLOGIC: Cranial nerves II through XII are intact. Muscle strength 5/5 in all extremities. Sensation intact. Gait not checked.  PSYCHIATRIC: The patient is alert and oriented x 3.  SKIN: No obvious rash, lesion, or ulcer.    LABORATORY PANEL:   CBC Recent Labs  Lab 07/18/17 0418  WBC 12.9*  HGB 14.9  HCT 44.1  PLT 276   ------------------------------------------------------------------------------------------------------------------  Chemistries  Recent Labs  Lab 07/18/17 0418 07/18/17 0737  NA 129* 129*  K 3.7 4.1  CL 104 103  CO2 17* 19*  GLUCOSE 121* 156*  BUN 21* 20  CREATININE 0.62 0.62  CALCIUM 8.8* 9.0  MG 1.9  --    ------------------------------------------------------------------------------------------------------------------  Cardiac Enzymes Recent Labs  Lab 07/17/17 1617  TROPONINI <0.03   ------------------------------------------------------------------------------------------------------------------  RADIOLOGY:  Dg Chest 2 View  Result Date: 07/17/2017 CLINICAL DATA:  Nausea, vomiting, history smoking, pancreatitis, COPD, diabetes mellitus, vulvar cancer EXAM: CHEST  2 VIEW COMPARISON:  02/06/2017 FINDINGS: Normal heart size, mediastinal contours, and pulmonary vascularity. Atherosclerotic calcification aorta. Lungs clear. No acute infiltrate, pleural effusion or pneumothorax. Bones demineralized. No acute abnormalities. IMPRESSION: No active cardiopulmonary disease. Electronically Signed   By: Lavonia Dana M.D.   On:  07/17/2017 16:59    EKG:   Orders placed or performed during the hospital encounter of 07/17/17  .  ED EKG within 10 minutes  . ED EKG within 10 minutes    ASSESSMENT AND PLAN:   52 year old female with past medical history significant for COPD, insulin-dependent type 2 diabetes mellitus, GERD, chronic pancreatitis, hypertension presents to hospital secondary to nausea vomiting and noted to have diabetic ketoacidosis  1. Diabetic ketoacidosis-admitted, IV fluids and started on insulin drip. -Not sure if pharyngitis triggered the symptoms. Patient states that she has been compliant with her insulin. -Anion gap closed and bicarbonate is improving. -Transition to subcutaneous insulin. -Diabetes coordinator input. -Carb-controlled diet. -Can be transferred out of ICU.  2. Hyponatremia-improving with fluids.   3. Epigastric pain-partially from her chronic pancreatitis symptoms and also from repeated vomiting. -Pain medications as needed. -Continue Protonix  4. Depression-on Celexa  5. Currently on Augmentin for her pharyngitis. Will continue that  6. DVT prophylaxis-changed to Lovenox     All the records are reviewed and case discussed with Care Management/Social Workerr. Management plans discussed with the patient, family and they are in agreement.  CODE STATUS: Full code  TOTAL TIME TAKING CARE OF THIS PATIENT: 38 minutes.   POSSIBLE D/C IN 1-2 DAYS, DEPENDING ON CLINICAL CONDITION.   Gladstone Lighter M.D on 07/18/2017 at 10:07 AM  Between 7am to 6pm - Pager - [phone]  After 6pm go to [email or web address] - password [email or web address] [facility]  Office  [phone]  CC: Primary care physician; Sherlyn Lees, FNP

## 2017-07-18 NOTE — Progress Notes (Signed)
Initial Nutrition Assessment  DOCUMENTATION CODES:   Severe malnutrition in context of chronic illness  INTERVENTION:  Glucerna Shake po BID, each supplement provides 220 kcal and 10 grams of protein  Premier Protein daily, each supplement provides 160 calories and 30 gm protein  NUTRITION DIAGNOSIS:   Severe Malnutrition related to chronic illness as evidenced by severe fat depletion, severe muscle depletion.  GOAL:   Patient will meet greater than or equal to 90% of their needs  MONITOR:   PO intake, I & O's, Labs, Supplement acceptance, Weight trends  REASON FOR ASSESSMENT:   Malnutrition Screening Tool    ASSESSMENT:   52 year old female with past medical history significant for COPD, insulin-dependent type 2 diabetes mellitus, GERD, chronic pancreatitis, hypertension presents to hospital secondary to nausea vomiting and noted to have diabetic ketoacidosis  Spoke with Ms. Bencomo at bedside. She reports eating nothing since Thursday due to constant vomiting. States she has lost 20 - 30 pounds since August with a UBW prior to that of 165 pounds. Per chart appears she was 151 pounds 10/2014. Appears her weight was stable between 126-133 pounds during the past year. States she has really only been eating 1 meal a day, 2 meals a day sometimes, but usually eats a salad or something like that. Had a tray ordered for breakfast but was told she was not allowed to eat it. CBGs normalizing, patient should be ok for PO intake.  Labs reviewed:  Na 129, CBGs 154, 153, 178 Phos 1.6, Lipase 187 Medications reviewed and include:  Insulin   NUTRITION - FOCUSED PHYSICAL EXAM:    Most Recent Value  Orbital Region  Mild depletion  Upper Arm Region  Severe depletion  Thoracic and Lumbar Region  No depletion  Buccal Region  Mild depletion  Temple Region  Severe depletion  Clavicle Bone Region  Moderate depletion  Clavicle and Acromion Bone Region  Moderate depletion  Scapular Bone  Region  Mild depletion  Dorsal Hand  Severe depletion  Patellar Region  Severe depletion  Anterior Thigh Region  Severe depletion  Posterior Calf Region  Severe depletion  Edema (RD Assessment)  None  Hair  Reviewed  Eyes  Reviewed  Mouth  Reviewed  Skin  Reviewed  Nails  Reviewed       Diet Order:  Diet Carb Modified Fluid consistency: Thin; Room service appropriate? Yes  EDUCATION NEEDS:   Education needs have been addressed  Skin:  Skin Assessment: Skin Integrity Issues: Skin Integrity Issues:: Other (Comment) Other: MASD to perineum  Last BM:  PTA  Height:   Ht Readings from Last 1 Encounters:  07/17/17 5\' 1"  (1.549 m)    Weight:   Wt Readings from Last 1 Encounters:  07/17/17 126 lb 1.7 oz (57.2 kg)    Ideal Body Weight:  47.72 kg  BMI:  Body mass index is 23.83 kg/m.  Estimated Nutritional Needs:   Kcal:  1431-1718 calories  Protein:  86-97 grams  Fluid:  1.4-1.7L   Satira Anis. Jaan Fischel, MS, RD LDN Inpatient Clinical Dietitian Pager [phone]

## 2017-07-18 NOTE — Progress Notes (Signed)
Complains of diffuse abdominal pain DKA has resolved  Vitals:   07/18/17 1000 07/18/17 1100 07/18/17 1104 07/18/17 1200  BP: 96/62  93/63   Pulse: 75 72 73 85  Resp: 15 17 14  (!) 21  Temp:      TempSrc:      SpO2: 97% 97% 97% 98%  Weight:      Height:        NAD Cognition intact HEENT WNL No JVD Chest clear RRR, no M Abdomen soft, NABS, mildly tender to palpation, no rebound Extremities warm, no edema No focal neurologic deficits  BMP Latest Ref Rng & Units 07/18/2017 07/18/2017 07/18/2017  Glucose 65 - 99 mg/dL 156(H) 121(H) 200(H)  BUN 6 - 20 mg/dL 20 21(H) 25(H)  Creatinine 0.44 - 1.00 mg/dL 0.62 0.62 0.69  Sodium 135 - 145 mmol/L 129(L) 129(L) 127(L)  Potassium 3.5 - 5.1 mmol/L 4.1 3.7 3.8  Chloride 101 - 111 mmol/L 103 104 106  CO2 22 - 32 mmol/L 19(L) 17(L) 14(L)  Calcium 8.9 - 10.3 mg/dL 9.0 8.8(L) 8.6(L)    CBC Latest Ref Rng & Units 07/18/2017 07/17/2017 07/17/2017  WBC 3.6 - 11.0 K/uL 12.9(H) 16.5(H) 14.4(H)  Hemoglobin 12.0 - 16.0 g/dL 14.9 15.0 18.1(H)  Hematocrit 35.0 - 47.0 % 44.1 45.3 56.8(H)  Platelets 150 - 440 K/uL 276 279 349    No new CXR  IMPRESSION: DKA, resolved Nonspecific abdominal pain History of chronic pancreatitis  PLAN/REC: Transition to Lantus + SSI LFTs and lipase ordered for a.m. 1/29 Transfer to [facility] floor After transfer, PCCM will sign off. Please call if we can be of further assistance   Merton Border, MD PCCM service Mobile [phone] Pager [phone] 07/18/2017 2:48 PM

## 2017-07-19 ENCOUNTER — Telehealth: Payer: Self-pay | Admitting: Pharmacy Technician

## 2017-07-19 LAB — CBC
HCT: 39.1 % (ref 35.0–47.0)
HEMOGLOBIN: 13.4 g/dL (ref 12.0–16.0)
MCH: 33.1 pg (ref 26.0–34.0)
MCHC: 34.4 g/dL (ref 32.0–36.0)
MCV: 96.4 fL (ref 80.0–100.0)
PLATELETS: 194 10*3/uL (ref 150–440)
RBC: 4.05 MIL/uL (ref 3.80–5.20)
RDW: 12.9 % (ref 11.5–14.5)
WBC: 8.9 10*3/uL (ref 3.6–11.0)

## 2017-07-19 LAB — LIPASE, BLOOD: LIPASE: 105 U/L — AB (ref 11–51)

## 2017-07-19 LAB — BASIC METABOLIC PANEL
ANION GAP: 7 (ref 5–15)
BUN: 14 mg/dL (ref 6–20)
CALCIUM: 8.5 mg/dL — AB (ref 8.9–10.3)
CO2: 18 mmol/L — ABNORMAL LOW (ref 22–32)
Chloride: 104 mmol/L (ref 101–111)
Glucose, Bld: 332 mg/dL — ABNORMAL HIGH (ref 65–99)
Potassium: 3.4 mmol/L — ABNORMAL LOW (ref 3.5–5.1)
SODIUM: 129 mmol/L — AB (ref 135–145)

## 2017-07-19 LAB — HEPATIC FUNCTION PANEL
ALT: 12 U/L — AB (ref 14–54)
AST: 15 U/L (ref 15–41)
Albumin: 3 g/dL — ABNORMAL LOW (ref 3.5–5.0)
Alkaline Phosphatase: 70 U/L (ref 38–126)
Bilirubin, Direct: <0.1 mg/dL — ABNORMAL LOW (ref 0.1–0.5)
TOTAL PROTEIN: 6.4 g/dL — AB (ref 6.5–8.1)
Total Bilirubin: 0.7 mg/dL (ref 0.3–1.2)

## 2017-07-19 LAB — GLUCOSE, CAPILLARY
GLUCOSE-CAPILLARY: 162 mg/dL — AB (ref 65–99)
GLUCOSE-CAPILLARY: 148 mg/dL — AB (ref 65–99)
GLUCOSE-CAPILLARY: 87 mg/dL (ref 65–99)
Glucose-Capillary: 293 mg/dL — ABNORMAL HIGH (ref 65–99)

## 2017-07-19 MED ORDER — GLUCERNA SHAKE PO LIQD
237.0000 mL | Freq: Two times a day (BID) | ORAL | Status: DC
  Administered 2017-07-20: 237 mL via ORAL

## 2017-07-19 MED ORDER — MORPHINE SULFATE (PF) 2 MG/ML IV SOLN
2.0000 mg | INTRAVENOUS | Status: DC | PRN
  Administered 2017-07-19 – 2017-07-20 (×5): 2 mg via INTRAVENOUS
  Filled 2017-07-19 (×5): qty 1

## 2017-07-19 MED ORDER — INSULIN GLARGINE 100 UNIT/ML ~~LOC~~ SOLN
20.0000 [IU] | Freq: Every day | SUBCUTANEOUS | Status: DC
  Filled 2017-07-19 (×2): qty 0.2

## 2017-07-19 MED ORDER — POTASSIUM CHLORIDE CRYS ER 20 MEQ PO TBCR
40.0000 meq | EXTENDED_RELEASE_TABLET | Freq: Once | ORAL | Status: AC
  Administered 2017-07-19: 40 meq via ORAL
  Filled 2017-07-19: qty 2

## 2017-07-19 MED ORDER — HYDROCODONE-ACETAMINOPHEN 5-325 MG PO TABS
1.0000 | ORAL_TABLET | ORAL | Status: DC | PRN
  Administered 2017-07-19 – 2017-07-20 (×4): 1 via ORAL
  Filled 2017-07-19 (×4): qty 1

## 2017-07-19 MED ORDER — PREMIER PROTEIN SHAKE: 11.0000 [oz_av] | ORAL | Status: DC

## 2017-07-19 MED ORDER — INSULIN GLARGINE 100 UNIT/ML ~~LOC~~ SOLN
12.0000 [IU] | Freq: Every day | SUBCUTANEOUS | Status: DC
  Filled 2017-07-19: qty 0.12

## 2017-07-19 MED ORDER — SODIUM CHLORIDE 0.9 % IV SOLN
INTRAVENOUS | Status: DC
  Administered 2017-07-19 (×2): via INTRAVENOUS

## 2017-07-19 MED ORDER — POTASSIUM CHLORIDE 10 MEQ/100ML IV SOLN
10.0000 meq | INTRAVENOUS | Status: AC
  Administered 2017-07-19 (×4): 10 meq via INTRAVENOUS
  Filled 2017-07-19 (×4): qty 100

## 2017-07-19 NOTE — Progress Notes (Signed)
[place] at [place] NAME: Renee Mack    MR#:  [id number]  DATE OF BIRTH:  1965-08-06  SUBJECTIVE:  CHIEF COMPLAINT:   Chief Complaint  Patient presents with  . Emesis  . Chest Pain   -Off insulin drip now.  Started on Lantus twice a day.  Complains of significant abdominal pain.  Lipase is elevated.  Known history of pancreatitis.  REVIEW OF SYSTEMS:  Review of Systems  Constitutional: Negative for chills, fever and malaise/fatigue.  HENT: Positive for sore throat. Negative for congestion, ear discharge, hearing loss and nosebleeds.   Eyes: Negative for blurred vision and double vision.  Respiratory: Negative for cough, shortness of breath and wheezing.   Cardiovascular: Negative for chest pain, palpitations and leg swelling.  Gastrointestinal: Positive for abdominal pain and nausea. Negative for constipation, diarrhea and vomiting.  Genitourinary: Negative for dysuria and urgency.  Musculoskeletal: Positive for myalgias.  Neurological: Negative for dizziness, speech change, focal weakness, seizures and headaches.  Psychiatric/Behavioral: Negative for depression.    DRUG ALLERGIES:   Allergies  Allergen Reactions  . Metformin And Related Shortness Of Breath  . Darvon [Propoxyphene] Itching  . Gabapentin Swelling  . Nsaids Other (See Comments)    Ulcers   . Tramadol Hives    VITALS:  Blood pressure (!) 96/56, pulse 79, temperature (!) 97.3 F (36.3 C), temperature source Axillary, resp. rate 14, height 5\' 1"  (1.549 m), weight 57.2 kg (126 lb 1.7 oz), SpO2 97 %.  PHYSICAL EXAMINATION:  Physical Exam  GENERAL:  52 y.o.-year-old patient lying in the bed with no acute distress.  EYES: Pupils equal, round, reactive to light and accommodation. No scleral icterus. Extraocular muscles intact.  HEENT: Head atraumatic, normocephalic. Oropharynx and nasopharynx clear.  NECK:  Supple, no jugular venous distention. No thyroid  enlargement, no tenderness.  LUNGS: Normal breath sounds bilaterally, no wheezing, rales,rhonchi or crepitation. No use of accessory muscles of respiration.  CARDIOVASCULAR: S1, S2 normal. No murmurs, rubs, or gallops.  ABDOMEN: Soft, diffuse tenderness especially in the epigastric region with voluntary guarding, no rigidity or rebound tenderness, nondistended. Bowel sounds present. No organomegaly or mass.  EXTREMITIES: No pedal edema, cyanosis, or clubbing.  NEUROLOGIC: Cranial nerves II through XII are intact. Muscle strength 5/5 in all extremities. Sensation intact. Gait not checked.  PSYCHIATRIC: The patient is alert and oriented x 3.  SKIN: No obvious rash, lesion, or ulcer.    LABORATORY PANEL:   CBC Recent Labs  Lab 07/19/17 0524  WBC 8.9  HGB 13.4  HCT 39.1  PLT 194   ------------------------------------------------------------------------------------------------------------------  Chemistries  Recent Labs  Lab 07/18/17 0418  07/19/17 0524  NA 129*   < > 129*  K 3.7   < > 3.4*  CL 104   < > 104  CO2 17*   < > 18*  GLUCOSE 121*   < > 332*  BUN 21*   < > 14  CREATININE 0.62   < > <0.30*  CALCIUM 8.8*   < > 8.5*  MG 1.9  --   --   AST  --   --  15  ALT  --   --  12*  ALKPHOS  --   --  70  BILITOT  --   --  0.7   < > = values in this interval not displayed.   ------------------------------------------------------------------------------------------------------------------  Cardiac Enzymes Recent Labs  Lab 07/17/17 1617  TROPONINI <0.03   ------------------------------------------------------------------------------------------------------------------  RADIOLOGY:  Dg Chest 2 View  Result Date: 07/17/2017 CLINICAL DATA:  Nausea, vomiting, history smoking, pancreatitis, COPD, diabetes mellitus, vulvar cancer EXAM: CHEST  2 VIEW COMPARISON:  02/06/2017 FINDINGS: Normal heart size, mediastinal contours, and pulmonary vascularity. Atherosclerotic calcification  aorta. Lungs clear. No acute infiltrate, pleural effusion or pneumothorax. Bones demineralized. No acute abnormalities. IMPRESSION: No active cardiopulmonary disease. Electronically Signed   By: Lavonia Dana M.D.   On: 07/17/2017 16:59    EKG:   Orders placed or performed during the hospital encounter of 07/17/17  . ED EKG within 10 minutes  . ED EKG within 10 minutes    ASSESSMENT AND PLAN:   52 year old female with past medical history significant for COPD, insulin-dependent type 2 diabetes mellitus, GERD, chronic pancreatitis, hypertension presents to hospital secondary to nausea vomiting and noted to have diabetic ketoacidosis  1. Diabetic ketoacidosis-patient states that she has type 2 diabetes.  However had several admissions for ketoacidosis. -Anion gap is closed.  Off insulin drip.  Restarted on Lantus at a lower dose at this time. -Remains only on clear liquids at this time.  Appreciate diabetes coordinator input. -A1c is 15.8 from December 2018  2. Hyponatremia-improving with fluids.   3.  Abdominal pain-likely acute pancreatitis.  Lipase is elevated but improving.  Change to clear liquid diet for now and monitor closely. -Continue IV fluids. -Pain medications  4. Depression-on Celexa  5. Currently on Augmentin for her pharyngitis. Will continue that  6. DVT prophylaxis-changed to Lovenox  7.  Hypokalemia-being replaced     All the records are reviewed and case discussed with Care Management/Social Workerr. Management plans discussed with the patient, family and they are in agreement.  CODE STATUS: Full code  TOTAL TIME TAKING CARE OF THIS PATIENT: 36 minutes.   POSSIBLE D/C IN 1-2 DAYS, DEPENDING ON CLINICAL CONDITION.   Gladstone Lighter M.D on 07/19/2017 at 2:48 PM  Between 7am to 6pm - Pager - [phone]  After 6pm go to [email or web address] - password [email or web address] [facility]  Office  [phone]  CC: Primary care physician; Sherlyn Lees, FNP

## 2017-07-19 NOTE — Progress Notes (Signed)
Pt arrived to room 146 from CCU. Skin assessment completed with Raquel Sarna, RN. Phone and call bell within reach. Bed alarm on. IV fluids infusing NS. Pt on room air.

## 2017-07-19 NOTE — Telephone Encounter (Signed)
Provided patient with new patient packet for [facility].  [facility]

## 2017-07-19 NOTE — Progress Notes (Signed)
Inpatient Diabetes Program Recommendations  AACE/ADA: New Consensus Statement on Inpatient Glycemic Control (2015)  Target Ranges:  Prepandial:   less than 140 mg/dL      Peak postprandial:   less than 180 mg/dL (1-2 hours)      Critically ill patients:  140 - 180 mg/dL   Lab Results  Component Value Date   GLUCAP 293 (H) 07/19/2017   HGBA1C 15.8 (H) 06/12/2017    Review of Glycemic Control Results for COURTNIE, BRENES (MRN [id number]) as of 07/19/2017 07:55  Ref. Range 07/18/2017 11:25 07/18/2017 11:55 07/18/2017 15:55 07/18/2017 21:31 07/19/2017 07:35  Glucose-Capillary Latest Ref Range: 65 - 99 mg/dL 153 (H) 154 (H) 343 (H) 184 (H) 293 (H)     Diabetes history: Type 2 Outpatient Diabetes medications: Novolog 0-20 units tid and hs, Lantus 50 units bid  Current orders for Inpatient glycemic control: Novolog 0-20 units tid, Novolog 0-5 units qhs, Novolog 4 units tid, Lantus 27 units qam, Lantus 12 units qhs  Inpatient Diabetes Program Recommendations: Noted fasting blood sugars elevated and Lantus 12 units added and to start tonight- likely will need a higher dose- was getting Lantus 27 units bid last admission and takes Lantus 50 units bid at home.    Gentry Fitz, RN, BA, MHA, CDE Diabetes Coordinator Inpatient Diabetes Program  [phone] (Team Pager) [phone] ([place]) 07/19/2017 8:03 AM    Text page to Dr. Tressia Miners

## 2017-07-19 NOTE — Progress Notes (Signed)
Patient is MedSurg overflow.  She remains in ICU/SDU due to lack of beds on MedSurg floors.  She was not seen officially by [facility] service.  Please let us know if we can be of further assistance.  Merton Border, MD PCCM service Mobile [phone] Pager [phone] 07/19/2017 1:45 PM

## 2017-07-19 NOTE — Progress Notes (Signed)
Inpatient Diabetes Program Recommendations  AACE/ADA: New Consensus Statement on Inpatient Glycemic Control (2015)  Target Ranges:  Prepandial:   less than 140 mg/dL      Peak postprandial:   less than 180 mg/dL (1-2 hours)      Critically ill patients:  140 - 180 mg/dL   Lab Results  Component Value Date   GLUCAP 293 (H) 07/19/2017   HGBA1C 15.8 (H) 06/12/2017     Inpatient Diabetes Program Recommendations:  Spent a great deal of time speaking to patient about her high A1C 15.8% , her recent health history and her home life.  Patient does take Lantus 50 units bid with an insulin pen- she is storing them appropriately. She tells me she is ordered Novolog 15 units tid but generally only takes it once a day around 3-4 pm because she only eats once a day- recently she has been taking Novolog 20 units because blood sugars have been high.  She tells me she has vulvular cancer and has had 3 surgeries- they want to do another surgery but won't do it until her A1C has improved.  She also shares that she lost her husband in August 2018 and had cared for him for a long time while he was sick. She lives in a safe place and she has access to food.   I suggest we discharge the patient home on Lantus bid plus sliding scale Novolog insulin tid/hs plus a set Novolog dose of insulin to be taken along with the sliding scale, if she decides to eat. I explained this to the patient and she understood and was willing to try this method, should we decide to use it.   Gentry Fitz, RN, BA, MHA, CDE Diabetes Coordinator Inpatient Diabetes Program  [phone] (Team Pager) [phone] ([place]) 07/19/2017 10:52 AM

## 2017-07-20 LAB — BASIC METABOLIC PANEL
ANION GAP: 7 (ref 5–15)
BUN: 5 mg/dL — ABNORMAL LOW (ref 6–20)
CHLORIDE: 102 mmol/L (ref 101–111)
CO2: 23 mmol/L (ref 22–32)
Calcium: 8.3 mg/dL — ABNORMAL LOW (ref 8.9–10.3)
Creatinine, Ser: <0.3 mg/dL — ABNORMAL LOW (ref 0.44–1.00)
Glucose, Bld: 164 mg/dL — ABNORMAL HIGH (ref 65–99)
Potassium: 3.5 mmol/L (ref 3.5–5.1)
SODIUM: 132 mmol/L — AB (ref 135–145)

## 2017-07-20 LAB — LIPASE, BLOOD: LIPASE: 43 U/L (ref 11–51)

## 2017-07-20 LAB — GLUCOSE, CAPILLARY
GLUCOSE-CAPILLARY: 170 mg/dL — AB (ref 65–99)
Glucose-Capillary: 172 mg/dL — ABNORMAL HIGH (ref 65–99)

## 2017-07-20 LAB — MAGNESIUM: MAGNESIUM: 1.8 mg/dL (ref 1.7–2.4)

## 2017-07-20 LAB — PHOSPHORUS: Phosphorus: 2.3 mg/dL — ABNORMAL LOW (ref 2.5–4.6)

## 2017-07-20 MED ORDER — INSULIN LISPRO 100 UNIT/ML ~~LOC~~ SOLN: SUBCUTANEOUS | 3 refills | Status: DC

## 2017-07-20 MED ORDER — INSULIN DETEMIR 100 UNIT/ML ~~LOC~~ SOLN: SUBCUTANEOUS | 11 refills | Status: DC

## 2017-07-20 NOTE — Progress Notes (Signed)
Discharge teaching given to pt. With teach back. Prescription given to pt. No distress noted.

## 2017-07-20 NOTE — Discharge Summary (Signed)
[facility] NAME: Renee Mack    MR#:  [id number]  DATE OF BIRTH:  May 05, 1966  DATE OF ADMISSION:  07/17/2017   ADMITTING PHYSICIAN: Henreitta Leber, MD  DATE OF DISCHARGE: 07/20/17  PRIMARY CARE PHYSICIAN: Sherlyn Lees, FNP   ADMISSION DIAGNOSIS:   Hyperglycemia [R73.9] Diabetic ketoacidosis without coma associated with other specified diabetes mellitus ([place]) [E13.10]  DISCHARGE DIAGNOSIS:   Active Problems:   DKA (diabetic ketoacidoses) (HCC)   Protein-calorie malnutrition, severe   SECONDARY DIAGNOSIS:   Past Medical History:  Diagnosis Date  . Cancer ([place])   . COPD (chronic obstructive pulmonary disease) ([place])   . Diabetes mellitus without complication ([place])   . GERD (gastroesophageal reflux disease)   . HLD (hyperlipidemia)   . Pancreatitis   . Shingles     HOSPITAL COURSE:   52 year old female with past medical history significant for COPD, insulin-dependent type 2 diabetes mellitus, GERD, chronic pancreatitis, hypertension presents to hospital secondary to nausea vomiting and noted to have diabetic ketoacidosis  1. Diabetic ketoacidosis-patient states that she has type 2 diabetes.  However had several admissions for ketoacidosis. -Admitted to ICU for insulin drip.  With fluids and IV insulin, anion gap has closed.  She is being transitioned to subcutaneous Levemir at discharge so she can get free medication management clinic. -Also Humalog for sliding scale as mentioned in the discharge medications.  -Tolerating low-carb diet well prior to discharge.. -A1c is 15.8 from December 2018  2. Hyponatremia-improved with fluids.   3.  Abdominal pain-acute pancreatitis.  Lipase is elevated but improved now. - -Fluids and pain medications. -Diet has been improved to a low carb diet.  Tolerating well.  4. Depression-on Celexa  5. Currently on Augmentin for her pharyngitis.  Will finish off in 3  days.  6.  Hypokalemia-replaced   Doing well, will be discharged today.    DISCHARGE CONDITIONS:   Guarded  CONSULTS OBTAINED:   None  DRUG ALLERGIES:   Allergies  Allergen Reactions  . Metformin And Related Shortness Of Breath  . Darvon [Propoxyphene] Itching  . Gabapentin Swelling  . Nsaids Other (See Comments)    Ulcers   . Tramadol Hives   DISCHARGE MEDICATIONS:   Allergies as of 07/20/2017      Reactions   Metformin And Related Shortness Of Breath   Darvon [propoxyphene] Itching   Gabapentin Swelling   Nsaids Other (See Comments)   Ulcers   Tramadol Hives      Medication List    STOP taking these medications   insulin aspart 100 UNIT/ML injection Commonly known as:  novoLOG   insulin glargine 100 UNIT/ML injection Commonly known as:  LANTUS   metoCLOPramide 10 MG tablet Commonly known as:  REGLAN     TAKE these medications   acetaminophen 325 MG tablet Commonly known as:  TYLENOL Take 650 mg by mouth every 6 (six) hours as needed for mild pain.   amoxicillin 875 MG tablet Commonly known as:  AMOXIL Take 1 tablet (875 mg total) by mouth 2 (two) times daily for 10 days.   aspirin 81 MG tablet Take 81 mg by mouth daily.   citalopram 20 MG tablet Commonly known as:  CELEXA Take 20 mg daily by mouth.   esomeprazole 40 MG capsule Commonly known as:  NEXIUM Take 40 mg by mouth daily at 12 noon.   insulin detemir 100 UNIT/ML injection Commonly known as:  LEVEMIR Take 30 units  subcutaneously in the morning and 20 units at bedtime   insulin lispro 100 UNIT/ML injection Commonly known as:  HUMALOG Take per sliding scale with meals:  For blood sugars  <150- no coverage 151-200- 2 units 201-250- 4 units 251-300- 6 units 301-350- 8 units and >351 call MD   LORazepam 0.5 MG tablet Commonly known as:  ATIVAN Take 1 tablet (0.5 mg total) by mouth every 8 (eight) hours as needed for anxiety.   PROAIR HFA 108 (90 Base) MCG/ACT  inhaler Generic drug:  albuterol Inhale 1 puff into the lungs every 6 (six) hours as needed for wheezing or shortness of breath.        DISCHARGE INSTRUCTIONS:   1. PCP f/u in 1 week 2. Outpatient lifestyle and dietary follow-up outpatient  DIET:   Diabetic diet  ACTIVITY:   Activity as tolerated  OXYGEN:   Home Oxygen: No.  Oxygen Delivery: room air  DISCHARGE LOCATION:   home   If you experience worsening of your admission symptoms, develop shortness of breath, life threatening emergency, suicidal or homicidal thoughts you must seek medical attention immediately by calling 911 or calling your MD immediately  if symptoms less severe.  You Must read complete instructions/literature along with all the possible adverse reactions/side effects for all the Medicines you take and that have been prescribed to you. Take any new Medicines after you have completely understood and accpet all the possible adverse reactions/side effects.   Please note  You were cared for by a hospitalist during your hospital stay. If you have any questions about your discharge medications or the care you received while you were in the hospital after you are discharged, you can call the unit and asked to speak with the hospitalist on call if the hospitalist that took care of you is not available. Once you are discharged, your primary care physician will handle any further medical issues. Please note that NO REFILLS for any discharge medications will be authorized once you are discharged, as it is imperative that you return to your primary care physician (or establish a relationship with a primary care physician if you do not have one) for your aftercare needs so that they can reassess your need for medications and monitor your lab values.    On the day of Discharge:  VITAL SIGNS:   Blood pressure (!) 105/56, pulse 89, temperature 98.9 F (37.2 C), temperature source Oral, resp. rate 18, height 5\' 1"   (1.549 m), weight 57.2 kg (126 lb 1.7 oz), SpO2 96 %.  PHYSICAL EXAMINATION:    GENERAL:  52 y.o.-year-old patient lying in the bed with no acute distress.  EYES: Pupils equal, round, reactive to light and accommodation. No scleral icterus. Extraocular muscles intact.  HEENT: Head atraumatic, normocephalic. Oropharynx and nasopharynx clear.  NECK:  Supple, no jugular venous distention. No thyroid enlargement, no tenderness.  LUNGS: Normal breath sounds bilaterally, no wheezing, rales,rhonchi or crepitation. No use of accessory muscles of respiration.  CARDIOVASCULAR: S1, S2 normal. No murmurs, rubs, or gallops.  ABDOMEN: Soft, minimal  tenderness especially in the epigastric region with voluntary guarding, no rigidity or rebound tenderness, nondistended. Bowel sounds present. No organomegaly or mass.  EXTREMITIES: No pedal edema, cyanosis, or clubbing.  NEUROLOGIC: Cranial nerves II through XII are intact. Muscle strength 5/5 in all extremities. Sensation intact. Gait not checked.  PSYCHIATRIC: The patient is alert and oriented x 3.  SKIN: No obvious rash, lesion, or ulcer.     DATA REVIEW:  CBC Recent Labs  Lab 07/19/17 0524  WBC 8.9  HGB 13.4  HCT 39.1  PLT 194    Chemistries  Recent Labs  Lab 07/19/17 0524 07/20/17 0547  NA 129* 132*  K 3.4* 3.5  CL 104 102  CO2 18* 23  GLUCOSE 332* 164*  BUN 14 5*  CREATININE <0.30* <0.30*  CALCIUM 8.5* 8.3*  MG  --  1.8  AST 15  --   ALT 12*  --   ALKPHOS 70  --   BILITOT 0.7  --      Microbiology Results  Results for orders placed or performed during the hospital encounter of 07/17/17  MRSA PCR Screening     Status: None   Collection Time: 07/17/17  8:17 PM  Result Value Ref Range Status   MRSA by PCR NEGATIVE NEGATIVE Final    Comment:        The GeneXpert MRSA Assay (FDA approved for NASAL specimens only), is one component of a comprehensive MRSA colonization surveillance program. It is not intended to diagnose  MRSA infection nor to guide or monitor treatment for MRSA infections. Performed at [facility], [address] [place], [place] [address]     RADIOLOGY:  No results found.   Management plans discussed with the patient, family and they are in agreement.  CODE STATUS:     Code Status Orders  (From admission, onward)        Start     Ordered   07/17/17 2005  Full code  Continuous     07/17/17 2004    Code Status History    Date Active Date Inactive Code Status Order ID Comments User Context   06/12/2017 22:27 06/16/2017 19:10 Full Code [id number]  Gorden Harms, MD Inpatient   12/28/2014 03:05 12/28/2014 19:19 Full Code [id number]  Lance Coon, MD Inpatient      TOTAL TIME TAKING CARE OF THIS PATIENT: 38 minutes.    Gladstone Lighter M.D on 07/20/2017 at 2:14 PM  Between 7am to 6pm - Pager - [phone]  After 6pm go to [email or web address] - Proofreader  [facility]  Office  [phone]  CC: Primary care physician; Sherlyn Lees, FNP   Note: This dictation was prepared with Dragon dictation along with smaller phrase technology. Any transcriptional errors that result from this process are unintentional.

## 2017-08-01 ENCOUNTER — Emergency Department
Admission: EM | Admit: 2017-08-01 | Discharge: 2017-08-01 | Disposition: A | Discharge status: Home / Self Care | Payer: Medicaid Other | Attending: Emergency Medicine | Admitting: Emergency Medicine

## 2017-08-01 ENCOUNTER — Encounter: Payer: Self-pay | Admitting: Emergency Medicine

## 2017-08-01 ENCOUNTER — Other Ambulatory Visit: Payer: Self-pay

## 2017-08-01 DIAGNOSIS — Z794 Long term (current) use of insulin: Secondary | ICD-10-CM | POA: Diagnosis not present

## 2017-08-01 DIAGNOSIS — Z7982 Long term (current) use of aspirin: Secondary | ICD-10-CM | POA: Diagnosis not present

## 2017-08-01 DIAGNOSIS — J449 Chronic obstructive pulmonary disease, unspecified: Secondary | ICD-10-CM | POA: Diagnosis not present

## 2017-08-01 DIAGNOSIS — E1165 Type 2 diabetes mellitus with hyperglycemia: Secondary | ICD-10-CM | POA: Insufficient documentation

## 2017-08-01 DIAGNOSIS — Z79899 Other long term (current) drug therapy: Secondary | ICD-10-CM | POA: Insufficient documentation

## 2017-08-01 DIAGNOSIS — F17210 Nicotine dependence, cigarettes, uncomplicated: Secondary | ICD-10-CM | POA: Diagnosis not present

## 2017-08-01 DIAGNOSIS — R112 Nausea with vomiting, unspecified: Secondary | ICD-10-CM | POA: Diagnosis present

## 2017-08-01 DIAGNOSIS — R739 Hyperglycemia, unspecified: Secondary | ICD-10-CM

## 2017-08-01 LAB — LIPASE, BLOOD: Lipase: 41 U/L (ref 11–51)

## 2017-08-01 LAB — CBC
HEMATOCRIT: 47.6 % — AB (ref 35.0–47.0)
Hemoglobin: 15.8 g/dL (ref 12.0–16.0)
MCH: 32.5 pg (ref 26.0–34.0)
MCHC: 33.1 g/dL (ref 32.0–36.0)
MCV: 98 fL (ref 80.0–100.0)
Platelets: 282 10*3/uL (ref 150–440)
RBC: 4.85 MIL/uL (ref 3.80–5.20)
RDW: 13.6 % (ref 11.5–14.5)
WBC: 11.4 10*3/uL — ABNORMAL HIGH (ref 3.6–11.0)

## 2017-08-01 LAB — BASIC METABOLIC PANEL
ANION GAP: 11 (ref 5–15)
BUN: 13 mg/dL (ref 6–20)
CALCIUM: 8.9 mg/dL (ref 8.9–10.3)
CO2: 17 mmol/L — AB (ref 22–32)
Chloride: 102 mmol/L (ref 101–111)
Creatinine, Ser: 0.54 mg/dL (ref 0.44–1.00)
Glucose, Bld: 411 mg/dL — ABNORMAL HIGH (ref 65–99)
Potassium: 3.8 mmol/L (ref 3.5–5.1)
SODIUM: 130 mmol/L — AB (ref 135–145)

## 2017-08-01 LAB — BLOOD GAS, VENOUS
PCO2 VEN: 35 mmHg — AB (ref 44.0–60.0)
PH VEN: 7.32 (ref 7.250–7.430)
PO2 VEN: 46 mmHg — AB (ref 32.0–45.0)
Patient temperature: 37

## 2017-08-01 LAB — URINALYSIS, COMPLETE (UACMP) WITH MICROSCOPIC
Bilirubin Urine: NEGATIVE
Glucose, UA: >=500 mg/dL — AB
Ketones, ur: 20 mg/dL — AB
NITRITE: NEGATIVE
Protein, ur: NEGATIVE mg/dL
Specific Gravity, Urine: 1.028 (ref 1.005–1.030)
pH: 5 (ref 5.0–8.0)

## 2017-08-01 LAB — GLUCOSE, CAPILLARY
GLUCOSE-CAPILLARY: 570 mg/dL — AB (ref 65–99)
Glucose-Capillary: 393 mg/dL — ABNORMAL HIGH (ref 65–99)
Glucose-Capillary: 370 mg/dL — ABNORMAL HIGH (ref 65–99)

## 2017-08-01 LAB — HEPATIC FUNCTION PANEL
ALT: 14 U/L (ref 14–54)
AST: 25 U/L (ref 15–41)
Albumin: 3.2 g/dL — ABNORMAL LOW (ref 3.5–5.0)
Alkaline Phosphatase: 76 U/L (ref 38–126)
BILIRUBIN TOTAL: 0.7 mg/dL (ref 0.3–1.2)
Total Protein: 6.9 g/dL (ref 6.5–8.1)

## 2017-08-01 MED ORDER — INSULIN ASPART 100 UNIT/ML ~~LOC~~ SOLN: 5.0000 [IU] | Freq: Once | SUBCUTANEOUS | Status: DC

## 2017-08-01 MED ORDER — MORPHINE SULFATE (PF) 4 MG/ML IV SOLN
INTRAVENOUS | Status: AC
  Filled 2017-08-01: qty 1

## 2017-08-01 MED ORDER — SUCRALFATE 1 G PO TABS: 1.0000 g | ORAL_TABLET | Freq: Four times a day (QID) | ORAL | 1 refills | Status: DC

## 2017-08-01 MED ORDER — OXYCODONE-ACETAMINOPHEN 5-325 MG PO TABS: 2.0000 | ORAL_TABLET | Freq: Four times a day (QID) | ORAL | 0 refills | Status: DC | PRN

## 2017-08-01 MED ORDER — MORPHINE SULFATE (PF) 4 MG/ML IV SOLN
4.0000 mg | Freq: Once | INTRAVENOUS | Status: AC
  Administered 2017-08-01: 4 mg via INTRAVENOUS

## 2017-08-01 MED ORDER — SODIUM CHLORIDE 0.9 % IV BOLUS (SEPSIS)
1000.0000 mL | Freq: Once | INTRAVENOUS | Status: AC
  Administered 2017-08-01: 1000 mL via INTRAVENOUS

## 2017-08-01 MED ORDER — INSULIN ASPART 100 UNIT/ML ~~LOC~~ SOLN
10.0000 [IU] | Freq: Once | SUBCUTANEOUS | Status: AC
  Administered 2017-08-01: 10 [IU] via SUBCUTANEOUS
  Filled 2017-08-01: qty 1

## 2017-08-01 MED ORDER — INSULIN ASPART 100 UNIT/ML ~~LOC~~ SOLN
10.0000 [IU] | Freq: Once | SUBCUTANEOUS | Status: AC
  Administered 2017-08-01: 10 [IU] via INTRAVENOUS
  Filled 2017-08-01: qty 1
  Filled 2017-08-01: qty 0.1

## 2017-08-01 MED ORDER — MORPHINE SULFATE (PF) 4 MG/ML IV SOLN
INTRAVENOUS | Status: AC
  Administered 2017-08-01: 4 mg via INTRAVENOUS
  Filled 2017-08-01: qty 1

## 2017-08-01 MED ORDER — SODIUM CHLORIDE 0.9 % IV SOLN
Freq: Once | INTRAVENOUS | Status: AC
  Administered 2017-08-01: 18:00:00 via INTRAVENOUS

## 2017-08-01 MED ORDER — ONDANSETRON HCL 4 MG/2ML IJ SOLN
4.0000 mg | Freq: Once | INTRAMUSCULAR | Status: AC
  Administered 2017-08-01: 4 mg via INTRAVENOUS
  Filled 2017-08-01: qty 2

## 2017-08-01 MED ORDER — PENICILLIN V POTASSIUM 250 MG PO TABS: 250.0000 mg | ORAL_TABLET | Freq: Four times a day (QID) | ORAL | 0 refills | Status: DC

## 2017-08-01 NOTE — ED Provider Notes (Addendum)
Labs Reviewed  CBC - Abnormal; Notable for the following components:      Result Value   WBC 11.4 (*)    HCT 47.6 (*)    All other components within normal limits  URINALYSIS, COMPLETE (UACMP) WITH MICROSCOPIC - Abnormal; Notable for the following components:   Color, Urine STRAW (*)    APPearance HAZY (*)    Glucose, UA >=500 (*)    Hgb urine dipstick SMALL (*)    Ketones, ur 20 (*)    Leukocytes, UA SMALL (*)    Bacteria, UA RARE (*)    Squamous Epithelial / LPF 0-5 (*)    All other components within normal limits  GLUCOSE, CAPILLARY - Abnormal; Notable for the following components:   Glucose-Capillary 570 (*)    All other components within normal limits  BASIC METABOLIC PANEL - Abnormal; Notable for the following components:   Sodium 130 (*)    CO2 17 (*)    Glucose, Bld 411 (*)    All other components within normal limits  BLOOD GAS, VENOUS - Abnormal; Notable for the following components:   pCO2, Ven 35 (*)    pO2, Ven 46.0 (*)    All other components within normal limits  HEPATIC FUNCTION PANEL - Abnormal; Notable for the following components:   Albumin 3.2 (*)    Bilirubin, Direct <0.1 (*)    All other components within normal limits  GLUCOSE, CAPILLARY - Abnormal; Notable for the following components:   Glucose-Capillary 393 (*)    All other components within normal limits  LIPASE, BLOOD  CBG MONITORING, ED  CBG MONITORING, ED  CBG MONITORING, ED  CBG MONITORING, ED  CBG MONITORING, ED  CBG MONITORING, ED   Patient's anion gap is normal at this time.  We have given additional fluids and insulin.  Overall she appears stable for outpatient follow-up with her doctor.   Earleen Newport, MD 08/01/17 Erskin Burnet    Earleen Newport, MD 08/01/17 [phone]

## 2017-08-01 NOTE — ED Provider Notes (Signed)
[facility] Emergency Department Provider Note  Time seen: 3:38 PM  I have reviewed the triage vital signs and the nursing notes.   HISTORY  Chief Complaint Hyperglycemia    HPI Renee Mack is a 52 y.o. female with a past medical history of COPD, diabetes, hyperlipidemia, pancreatitis presents to the emergency department for abdominal discomfort.  According to the patient since yesterday she has been feeling nauseated with vomiting diffuse abdominal pain which she states is more focused in the upper abdomen.  States she has had DKA before and this feels somewhat similar, use ketone strips at home that were positive.  Patient states she has been taking her insulin as prescribed which is Levemir once at night once in the morning with a Humalog sliding scale throughout the day.  Denies missing any doses.  States she first noticed her glucose was high this morning, read greater than 600 at home.  Patient states she has had dysuria for the past 2-3 days as well with a mild odor to it.  Otherwise largely negative review of systems besides a mild headache.   Past Medical History:  Diagnosis Date  . Cancer ([place])   . COPD (chronic obstructive pulmonary disease) ([place])   . Diabetes mellitus without complication ([place])   . GERD (gastroesophageal reflux disease)   . HLD (hyperlipidemia)   . Pancreatitis   . Shingles     Patient Active Problem List   Diagnosis Date Noted  . Protein-calorie malnutrition, severe 07/18/2017  . DKA (diabetic ketoacidoses) ([place]) 07/17/2017  . Acute ischemic enteritis ([place]) 06/12/2017  . Pyelonephritis 12/28/2014  . Type 2 diabetes mellitus ([place]) 12/28/2014  . COPD (chronic obstructive pulmonary disease) ([place]) 12/28/2014  . GERD (gastroesophageal reflux disease) 12/28/2014  . History of shingles 12/28/2014  . HLD (hyperlipidemia) 12/28/2014    Past Surgical History:  Procedure Laterality Date  . ABDOMINAL HYSTERECTOMY     partial  .  ECTOPIC PREGNANCY SURGERY Left   . LYMPHADENECTOMY    . SPLENECTOMY, PARTIAL    . vulvulasectomy      Prior to Admission medications   Medication Sig Start Date End Date Taking? Authorizing Provider  acetaminophen (TYLENOL) 325 MG tablet Take 650 mg by mouth every 6 (six) hours as needed for mild pain.     [provider]  albuterol (PROAIR HFA) 108 (90 Base) MCG/ACT inhaler Inhale 1 puff into the lungs every 6 (six) hours as needed for wheezing or shortness of breath.     [provider]  aspirin 81 MG tablet Take 81 mg by mouth daily.    [provider]  citalopram (CELEXA) 20 MG tablet Take 20 mg daily by mouth.    [provider]  esomeprazole (NEXIUM) 40 MG capsule Take 40 mg by mouth daily at 12 noon.    [provider]  insulin detemir (LEVEMIR) 100 UNIT/ML injection Take 30 units subcutaneously in the morning and 20 units at bedtime 07/20/17   Gladstone Lighter, MD  insulin lispro (HUMALOG) 100 UNIT/ML injection Take per sliding scale with meals:  For blood sugars  <150- no coverage 151-200- 2 units 201-250- 4 units 251-300- 6 units 301-350- 8 units and >351 call MD 07/20/17 07/20/18  Gladstone Lighter, MD  LORazepam (ATIVAN) 0.5 MG tablet Take 1 tablet (0.5 mg total) by mouth every 8 (eight) hours as needed for anxiety. 02/06/17 02/06/18  Lisa Roca, MD    Allergies  Allergen Reactions  . Metformin And Related Shortness Of Breath  .  Darvon [Propoxyphene] Itching  . Gabapentin Swelling  . Nsaids Other (See Comments)    Ulcers   . Tramadol Hives    Family History  Problem Relation Age of Onset  . Cancer Mother   . Osteoporosis Sister   . Heart disease Brother   . Heart attack Father     Social History Social History   Tobacco Use  . Smoking status: Current Every Day Smoker    Packs/day: 1.00    Years: 30.00    Pack years: 30.00    Types: Cigarettes  . Smokeless tobacco: Never Used  Substance Use Topics  .  Alcohol use: No  . Drug use: No    Review of Systems Constitutional: Negative for fever. Eyes: Negative for visual complaints ENT: Negative for recent illness/congestion Cardiovascular: Negative for chest pain. Respiratory: Negative for shortness of breath.  Occasional dry cough. Gastrointestinal: Diffuse abdominal pain, worse in the upper abdomen.  Positive for nausea vomiting.  Negative for diarrhea. Genitourinary: Mild dysuria with foul odor. Musculoskeletal: Negative for musculoskeletal complaints Skin: Negative for skin complaints  Neurological: Mild headache. All other ROS negative  ____________________________________________   PHYSICAL EXAM:  VITAL SIGNS: ED Triage Vitals [08/01/17 1445]  Enc Vitals Group     BP (!) 159/77     Pulse Rate (!) 103     Resp 20     Temp (!) 97.5 F (36.4 C)     Temp Source Oral     SpO2 100 %     Weight      Height      Head Circumference      Peak Flow      Pain Score 10     Pain Loc      Pain Edu?      Excl. in [place]?    Constitutional: Alert and oriented. Well appearing and in no distress. Eyes: Normal exam ENT   Head: Normocephalic and atraumatic   Mouth/Throat: Mucous membranes are moist. Cardiovascular: Normal rate, regular rhythm. No murmur Respiratory: Normal respiratory effort without tachypnea nor retractions. Breath sounds are clear  Gastrointestinal: Soft, mild to moderate epigastric tenderness to palpation, no rebound or guarding.  No distention.  Abdomen largely benign otherwise. Musculoskeletal: Nontender with normal range of motion in all extremities.  Neurologic:  Normal speech and language. No gross focal neurologic deficits Skin:  Skin is warm, dry and intact.  Psychiatric: Mood and affect are normal.   ____________________________________________   INITIAL IMPRESSION / ASSESSMENT AND PLAN / ED COURSE  Pertinent labs & imaging results that were available during my care of the patient were reviewed  by me and considered in my medical decision making (see chart for details).  Patient presents to the emergency department for nausea vomiting upper abdominal discomfort.  States symptoms have been ongoing since yesterday evening.  Differential would include DKA, pancreatitis, gallbladder disease, gastroenteritis.  Patient's blood glucose greater than 500s we will dose insulin, fluids, check the remainder of her labs including a VBG to help rule out DKA.  Patient's VBG shows a 7.32 pH, BMP is pending, hepatic function panel and lipase are pending.  Patient care signed out to oncoming physician.  ____________________________________________   FINAL CLINICAL IMPRESSION(S) / ED DIAGNOSES  Abdominal pain Hyperglycemia Emesis    Harvest Dark, MD 08/01/17 1706

## 2017-08-01 NOTE — ED Notes (Signed)
ED Provider at bedside. 

## 2017-08-01 NOTE — ED Triage Notes (Signed)
Pt with high blood sugar and abd pain this am. Also c/o n/v.

## 2017-09-01 ENCOUNTER — Other Ambulatory Visit: Payer: Self-pay

## 2017-09-01 ENCOUNTER — Emergency Department
Admission: EM | Admit: 2017-09-01 | Discharge: 2017-09-01 | Disposition: A | Discharge status: Home / Self Care | Payer: Medicaid Other | Attending: Emergency Medicine | Admitting: Emergency Medicine

## 2017-09-01 DIAGNOSIS — Z79899 Other long term (current) drug therapy: Secondary | ICD-10-CM | POA: Diagnosis not present

## 2017-09-01 DIAGNOSIS — R112 Nausea with vomiting, unspecified: Secondary | ICD-10-CM | POA: Diagnosis not present

## 2017-09-01 DIAGNOSIS — R739 Hyperglycemia, unspecified: Secondary | ICD-10-CM | POA: Diagnosis present

## 2017-09-01 DIAGNOSIS — J449 Chronic obstructive pulmonary disease, unspecified: Secondary | ICD-10-CM | POA: Insufficient documentation

## 2017-09-01 DIAGNOSIS — R1013 Epigastric pain: Secondary | ICD-10-CM | POA: Diagnosis not present

## 2017-09-01 DIAGNOSIS — Z8544 Personal history of malignant neoplasm of other female genital organs: Secondary | ICD-10-CM | POA: Insufficient documentation

## 2017-09-01 DIAGNOSIS — E119 Type 2 diabetes mellitus without complications: Secondary | ICD-10-CM | POA: Insufficient documentation

## 2017-09-01 DIAGNOSIS — N3001 Acute cystitis with hematuria: Secondary | ICD-10-CM | POA: Diagnosis not present

## 2017-09-01 DIAGNOSIS — Z794 Long term (current) use of insulin: Secondary | ICD-10-CM | POA: Insufficient documentation

## 2017-09-01 DIAGNOSIS — R300 Dysuria: Secondary | ICD-10-CM | POA: Insufficient documentation

## 2017-09-01 DIAGNOSIS — F17210 Nicotine dependence, cigarettes, uncomplicated: Secondary | ICD-10-CM | POA: Insufficient documentation

## 2017-09-01 DIAGNOSIS — Z7982 Long term (current) use of aspirin: Secondary | ICD-10-CM | POA: Diagnosis not present

## 2017-09-01 LAB — COMPREHENSIVE METABOLIC PANEL
ALBUMIN: 3.8 g/dL (ref 3.5–5.0)
ALK PHOS: 87 U/L (ref 38–126)
ALT: 8 U/L — ABNORMAL LOW (ref 14–54)
AST: 17 U/L (ref 15–41)
Anion gap: 15 (ref 5–15)
BUN: 15 mg/dL (ref 6–20)
CHLORIDE: 95 mmol/L — AB (ref 101–111)
CO2: 20 mmol/L — AB (ref 22–32)
Calcium: 8.6 mg/dL — ABNORMAL LOW (ref 8.9–10.3)
Creatinine, Ser: 0.8 mg/dL (ref 0.44–1.00)
GFR calc Af Amer: >60 mL/min (ref >60)
GFR calc non Af Amer: >60 mL/min (ref >60)
GLUCOSE: 444 mg/dL — AB (ref 65–99)
POTASSIUM: 4.4 mmol/L (ref 3.5–5.1)
SODIUM: 130 mmol/L — AB (ref 135–145)
Total Bilirubin: 2.1 mg/dL — ABNORMAL HIGH (ref 0.3–1.2)
Total Protein: 7.7 g/dL (ref 6.5–8.1)

## 2017-09-01 LAB — URINALYSIS, COMPLETE (UACMP) WITH MICROSCOPIC
BILIRUBIN URINE: NEGATIVE
Bacteria, UA: NONE SEEN
KETONES UR: 20 mg/dL — AB
NITRITE: NEGATIVE
PH: 6 (ref 5.0–8.0)
PROTEIN: NEGATIVE mg/dL
Specific Gravity, Urine: 1.03 (ref 1.005–1.030)

## 2017-09-01 LAB — GLUCOSE, CAPILLARY
Glucose-Capillary: 453 mg/dL — ABNORMAL HIGH (ref 65–99)
Glucose-Capillary: 350 mg/dL — ABNORMAL HIGH (ref 65–99)
Glucose-Capillary: 257 mg/dL — ABNORMAL HIGH (ref 65–99)

## 2017-09-01 LAB — CBC
HCT: 46.3 % (ref 35.0–47.0)
HEMOGLOBIN: 16.1 g/dL — AB (ref 12.0–16.0)
MCH: 34.2 pg — AB (ref 26.0–34.0)
MCHC: 34.9 g/dL (ref 32.0–36.0)
MCV: 98.1 fL (ref 80.0–100.0)
PLATELETS: 261 10*3/uL (ref 150–440)
RBC: 4.72 MIL/uL (ref 3.80–5.20)
RDW: 14.2 % (ref 11.5–14.5)
WBC: 9.1 10*3/uL (ref 3.6–11.0)

## 2017-09-01 LAB — LIPASE, BLOOD: LIPASE: 23 U/L (ref 11–51)

## 2017-09-01 MED ORDER — INSULIN ASPART 100 UNIT/ML ~~LOC~~ SOLN
SUBCUTANEOUS | Status: AC
  Administered 2017-09-01: 10 [IU]
  Filled 2017-09-01: qty 1

## 2017-09-01 MED ORDER — ONDANSETRON HCL 4 MG/2ML IJ SOLN
4.0000 mg | Freq: Once | INTRAMUSCULAR | Status: AC
  Administered 2017-09-01: 4 mg via INTRAVENOUS
  Filled 2017-09-01: qty 2

## 2017-09-01 MED ORDER — CEPHALEXIN 500 MG PO CAPS: 500.0000 mg | ORAL_CAPSULE | Freq: Three times a day (TID) | ORAL | 0 refills | Status: AC

## 2017-09-01 MED ORDER — SODIUM CHLORIDE 0.9 % IV BOLUS (SEPSIS)
1000.0000 mL | Freq: Once | INTRAVENOUS | Status: AC
  Administered 2017-09-01: 1000 mL via INTRAVENOUS

## 2017-09-01 MED ORDER — FENTANYL CITRATE (PF) 100 MCG/2ML IJ SOLN
50.0000 ug | Freq: Once | INTRAMUSCULAR | Status: AC
  Administered 2017-09-01: 50 ug via INTRAVENOUS
  Filled 2017-09-01: qty 2

## 2017-09-01 MED ORDER — SODIUM CHLORIDE 0.9 % IV SOLN
1.0000 g | Freq: Once | INTRAVENOUS | Status: AC
  Administered 2017-09-01: 1 g via INTRAVENOUS
  Filled 2017-09-01: qty 10

## 2017-09-01 MED ORDER — ACETAMINOPHEN 500 MG PO TABS
ORAL_TABLET | ORAL | Status: AC
  Administered 2017-09-01: 13:00:00
  Filled 2017-09-01: qty 2

## 2017-09-01 MED ORDER — ONDANSETRON 4 MG PO TBDP: 4.0000 mg | ORAL_TABLET | Freq: Three times a day (TID) | ORAL | 0 refills | Status: DC | PRN

## 2017-09-01 NOTE — ED Notes (Signed)
Pt POC CBG was 350 verbal orders received for 10 units of novolog IV and 1000 ml bolus administered at this time. RN will continue to monitor.

## 2017-09-01 NOTE — ED Notes (Signed)
Patient to Room 4, [facility] RN aware of placement in room.

## 2017-09-01 NOTE — ED Triage Notes (Signed)
Pt c/o LUQ pain with N/V that started yesterday, states her glucose read HIGH this morning. States she has a hx of DM and pancreatitis, states this feels like a flare up.Marland Kitchen

## 2017-09-01 NOTE — ED Notes (Signed)
First Nurse Note:  Patient states she has pancreatitis, BS "[place]" on meter this AM.  Alert and oriented.  Patient given a WC.

## 2017-09-01 NOTE — ED Notes (Signed)
Pt reporting pain in the back and abd, EDp aware verbal order for 1000 mg of tylenol given at this time.

## 2017-09-01 NOTE — ED Provider Notes (Signed)
[facility] Emergency Department Provider Note  ____________________________________________  Time seen: Approximately 9:29 AM  I have reviewed the triage vital signs and the nursing notes.   HISTORY  Chief Complaint Abdominal Pain and Hyperglycemia   HPI Renee Mack is a 52 y.o. female with a history of active vulva cancer not on treatment, COPD, diabetes, GERD, pancreatitis and presents for evaluation of abdominal pain and hyperglycemia. Patient reports that her abdominal pain started yesterday and has gotten progressively worse over the last 24 hours. Pain is 10 out of 10, sharp, located in the epigastric region, radiating to her back, associated with nausea and several episodes of nonbloody nonbilious emesis. Patient reports that her glucometer was reading high since yesterday. She endorses compliance with her insulin regimen. She also endorses dysuria for the last few days. No flank pain, no fever or chills, no hematuria, no diarrhea. Patient denies being a drinker. She does have her gallbladder.  Past Medical History:  Diagnosis Date  . Cancer ([place])    vulvular  . COPD (chronic obstructive pulmonary disease) ([place])   . Diabetes mellitus without complication ([place])   . GERD (gastroesophageal reflux disease)   . HLD (hyperlipidemia)   . Pancreatitis   . Shingles     Patient Active Problem List   Diagnosis Date Noted  . Protein-calorie malnutrition, severe 07/18/2017  . DKA (diabetic ketoacidoses) ([place]) 07/17/2017  . Acute ischemic enteritis ([place]) 06/12/2017  . Pyelonephritis 12/28/2014  . Type 2 diabetes mellitus ([place]) 12/28/2014  . COPD (chronic obstructive pulmonary disease) ([place]) 12/28/2014  . GERD (gastroesophageal reflux disease) 12/28/2014  . History of shingles 12/28/2014  . HLD (hyperlipidemia) 12/28/2014    Past Surgical History:  Procedure Laterality Date  . ABDOMINAL HYSTERECTOMY     partial  . ECTOPIC PREGNANCY SURGERY Left     . LYMPHADENECTOMY    . SPLENECTOMY, PARTIAL    . vulvulasectomy      Prior to Admission medications   Medication Sig Start Date End Date Taking? Authorizing Provider  acetaminophen (TYLENOL) 325 MG tablet Take 650 mg by mouth every 6 (six) hours as needed for mild pain.    Yes [provider]  albuterol (PROAIR HFA) 108 (90 Base) MCG/ACT inhaler Inhale 1 puff into the lungs every 6 (six) hours as needed for wheezing or shortness of breath.    Yes [provider]  aspirin 81 MG tablet Take 81 mg by mouth daily.   Yes [provider]  citalopram (CELEXA) 20 MG tablet Take 20 mg daily by mouth.   Yes [provider]  esomeprazole (NEXIUM) 40 MG capsule Take 40 mg by mouth daily at 12 noon.   Yes [provider]  insulin detemir (LEVEMIR) 100 UNIT/ML injection Take 30 units subcutaneously in the morning and 20 units at bedtime 07/20/17  Yes Gladstone Lighter, MD  insulin lispro (HUMALOG) 100 UNIT/ML injection Take per sliding scale with meals:  For blood sugars  <150- no coverage 151-200- 2 units 201-250- 4 units 251-300- 6 units 301-350- 8 units and >351 call MD 07/20/17 07/20/18 Yes Gladstone Lighter, MD  LORazepam (ATIVAN) 0.5 MG tablet Take 1 tablet (0.5 mg total) by mouth every 8 (eight) hours as needed for anxiety. 02/06/17 02/06/18 Yes Lisa Roca, MD  sucralfate (CARAFATE) 1 g tablet Take 1 tablet (1 g total) by mouth 4 (four) times daily. 08/01/17 08/01/18 Yes Earleen Newport, MD  cephALEXin (KEFLEX) 500 MG capsule Take 1 capsule (500 mg total) by mouth  3 (three) times daily for 7 days. 09/01/17 09/08/17  Rudene Re, MD  ondansetron (ZOFRAN ODT) 4 MG disintegrating tablet Take 1 tablet (4 mg total) by mouth every 8 (eight) hours as needed for nausea or vomiting. 09/01/17   Alfred Levins, [place], MD  oxyCODONE-acetaminophen (PERCOCET) 5-325 MG tablet Take 2 tablets by mouth every 6 (six) hours as needed. Patient not taking: Reported on  09/01/2017 08/01/17   Earleen Newport, MD  penicillin v potassium (VEETID) 250 MG tablet Take 1 tablet (250 mg total) by mouth 4 (four) times daily. Patient not taking: Reported on 09/01/2017 08/01/17   Earleen Newport, MD    Allergies Metformin and related; Darvon [propoxyphene]; Gabapentin; Nsaids; and Tramadol  Family History  Problem Relation Age of Onset  . Cancer Mother   . Osteoporosis Sister   . Heart disease Brother   . Heart attack Father     Social History Social History   Tobacco Use  . Smoking status: Current Every Day Smoker    Packs/day: 1.00    Years: 30.00    Pack years: 30.00    Types: Cigarettes  . Smokeless tobacco: Never Used  Substance Use Topics  . Alcohol use: No  . Drug use: No    Review of Systems  Constitutional: Negative for fever. Eyes: Negative for visual changes. ENT: Negative for sore throat. Neck: No neck pain  Cardiovascular: Negative for chest pain. Respiratory: Negative for shortness of breath. Gastrointestinal: + epigastric abdominal pain, nausea, and vomiting. No diarrhea. Genitourinary: Negative for dysuria. Musculoskeletal: Negative for back pain. Skin: Negative for rash. Neurological: Negative for headaches, weakness or numbness. Psych: No SI or HI  ____________________________________________   PHYSICAL EXAM:  VITAL SIGNS: ED Triage Vitals  Enc Vitals Group     BP 09/01/17 0905 128/82     Pulse Rate 09/01/17 0905 89     Resp 09/01/17 0905 17     Temp 09/01/17 0905 97.8 F (36.6 C)     Temp Source 09/01/17 0905 Oral     SpO2 09/01/17 0905 100 %     Weight 09/01/17 0906 130 lb (59 kg)     Height 09/01/17 0906 5\' 1"  (1.549 m)     Head Circumference --      Peak Flow --      Pain Score 09/01/17 0906 10     Pain Loc --      Pain Edu? --      Excl. in [place]? --     Constitutional: Alert and oriented, looks uncomfortable but no distress.  HEENT:      Head: Normocephalic and atraumatic.         Eyes:  Conjunctivae are normal. Sclera is non-icteric.       Mouth/Throat: Mucous membranes are moist.       Neck: Supple with no signs of meningismus. Cardiovascular: Regular rate and rhythm. No murmurs, gallops, or rubs. 2+ symmetrical distal pulses are present in all extremities. No JVD. Respiratory: Normal respiratory effort. Lungs are clear to auscultation bilaterally. No wheezes, crackles, or rhonchi.  Gastrointestinal: Soft, ttp over the epigastric region, there is a reducible ventral hernia, and non distended with positive bowel sounds. No rebound or guarding. Musculoskeletal: Nontender with normal range of motion in all extremities. No edema, cyanosis, or erythema of extremities. Neurologic: Normal speech and language. Face is symmetric. Moving all extremities. No gross focal neurologic deficits are appreciated. Skin: Skin is warm, dry and intact. No rash noted. Psychiatric: Mood and affect  are normal. Speech and behavior are normal.  ____________________________________________   LABS (all labs ordered are listed, but only abnormal results are displayed)  Labs Reviewed  GLUCOSE, CAPILLARY - Abnormal; Notable for the following components:      Result Value   Glucose-Capillary 453 (*)    All other components within normal limits  COMPREHENSIVE METABOLIC PANEL - Abnormal; Notable for the following components:   Sodium 130 (*)    Chloride 95 (*)    CO2 20 (*)    Glucose, Bld 444 (*)    Calcium 8.6 (*)    ALT 8 (*)    Total Bilirubin 2.1 (*)    All other components within normal limits  CBC - Abnormal; Notable for the following components:   Hemoglobin 16.1 (*)    MCH 34.2 (*)    All other components within normal limits  URINALYSIS, COMPLETE (UACMP) WITH MICROSCOPIC - Abnormal; Notable for the following components:   Color, Urine STRAW (*)    APPearance HAZY (*)    Glucose, UA >=500 (*)    Hgb urine dipstick SMALL (*)    Ketones, ur 20 (*)    Leukocytes, UA MODERATE (*)     Squamous Epithelial / LPF 0-5 (*)    All other components within normal limits  GLUCOSE, CAPILLARY - Abnormal; Notable for the following components:   Glucose-Capillary 350 (*)    All other components within normal limits  GLUCOSE, CAPILLARY - Abnormal; Notable for the following components:   Glucose-Capillary 257 (*)    All other components within normal limits  URINE CULTURE  LIPASE, BLOOD  CBG MONITORING, ED   ____________________________________________  EKG  ED ECG REPORT I, Rudene Re, the attending physician, personally viewed and interpreted this ECG.  Normal sinus rhythm, rate of 75, normal intervals, normal axis, no ST elevations or depressions, anterior Q waves.unchanged from prior from January 2019 ____________________________________________  RADIOLOGY  none  ____________________________________________   PROCEDURES  Procedure(s) performed: None Procedures Critical Care performed:  None ____________________________________________   INITIAL IMPRESSION / ASSESSMENT AND PLAN / ED COURSE   52 y.o. female with a history of active vulva cancer not on treatment, COPD, diabetes, GERD, pancreatitis and presents for evaluation of abdominal pain and hyperglycemia. patient looks uncomfortable but no distress, vitals are within normal limits, abdomen is soft with epigastric tenderness, there is a ventral hernia that is fully reducible, she has normal bowel sounds. Differential diagnoses including pancreatitis versus gastritis versus peptic ulcer disease versus gastroparesis versus UTI vs pyelo vs colitis versus SBO versus gallbladder disease. We'll give fentanyl, fluids, Zofran for symptom control. We'll check labs, EKG, urinalysis    _________________________ 1:34 PM on 09/01/2017 -----------------------------------------  labs consistent with hyperglycemia but no evidence of DKA. after 2 L of normal saline and 10 units of IV insulin patient's blood glucose is now  257. She is tolerating by mouth. No further episodes of nausea and vomiting the emergency room. UA is positive for urinary tract infection. No signs of sepsis. She was given Rocephin. Cultures pending. She is going to be discharged home on Keflex.patient was given a prescription of Keflex and Zofran. Discussed return precautions.   As part of my medical decision making, I reviewed the following data within the [place] notes reviewed and incorporated, Labs reviewed , EKG interpreted  Old EKG reviewed, Old chart reviewed, Notes from prior ED visits and [place] Controlled Substance Database    Pertinent labs & imaging results that were available  during my care of the patient were reviewed by me and considered in my medical decision making (see chart for details).    ____________________________________________   FINAL CLINICAL IMPRESSION(S) / ED DIAGNOSES  Final diagnoses:  Hyperglycemia  Epigastric abdominal pain  Nausea and vomiting, intractability of vomiting not specified, unspecified vomiting type  Acute cystitis with hematuria      NEW MEDICATIONS STARTED DURING THIS VISIT:  ED Discharge Orders        Ordered    ondansetron (ZOFRAN ODT) 4 MG disintegrating tablet  Every 8 hours PRN     09/01/17 1331    cephALEXin (KEFLEX) 500 MG capsule  3 times daily     09/01/17 1331       Note:  This document was prepared using Dragon voice recognition software and may include unintentional dictation errors.    Rudene Re, MD 09/01/17 1336

## 2017-09-02 LAB — URINE CULTURE

## 2017-09-27 ENCOUNTER — Inpatient Hospital Stay
Admission: EM | Admit: 2017-09-27 | Discharge: 2017-09-29 | DRG: 638 | Disposition: A | Discharge status: Home / Self Care | Payer: Medicaid Other | Attending: Internal Medicine | Admitting: Internal Medicine

## 2017-09-27 ENCOUNTER — Other Ambulatory Visit: Payer: Self-pay

## 2017-09-27 DIAGNOSIS — E785 Hyperlipidemia, unspecified: Secondary | ICD-10-CM | POA: Diagnosis present

## 2017-09-27 DIAGNOSIS — K219 Gastro-esophageal reflux disease without esophagitis: Secondary | ICD-10-CM | POA: Diagnosis present

## 2017-09-27 DIAGNOSIS — F329 Major depressive disorder, single episode, unspecified: Secondary | ICD-10-CM | POA: Diagnosis present

## 2017-09-27 DIAGNOSIS — Z8262 Family history of osteoporosis: Secondary | ICD-10-CM

## 2017-09-27 DIAGNOSIS — Z885 Allergy status to narcotic agent status: Secondary | ICD-10-CM

## 2017-09-27 DIAGNOSIS — R300 Dysuria: Secondary | ICD-10-CM | POA: Diagnosis present

## 2017-09-27 DIAGNOSIS — Z794 Long term (current) use of insulin: Secondary | ICD-10-CM

## 2017-09-27 DIAGNOSIS — Z888 Allergy status to other drugs, medicaments and biological substances status: Secondary | ICD-10-CM

## 2017-09-27 DIAGNOSIS — J449 Chronic obstructive pulmonary disease, unspecified: Secondary | ICD-10-CM | POA: Diagnosis present

## 2017-09-27 DIAGNOSIS — Z90711 Acquired absence of uterus with remaining cervical stump: Secondary | ICD-10-CM

## 2017-09-27 DIAGNOSIS — K861 Other chronic pancreatitis: Secondary | ICD-10-CM | POA: Diagnosis present

## 2017-09-27 DIAGNOSIS — Z8249 Family history of ischemic heart disease and other diseases of the circulatory system: Secondary | ICD-10-CM

## 2017-09-27 DIAGNOSIS — Z79899 Other long term (current) drug therapy: Secondary | ICD-10-CM

## 2017-09-27 DIAGNOSIS — N9089 Other specified noninflammatory disorders of vulva and perineum: Secondary | ICD-10-CM | POA: Diagnosis present

## 2017-09-27 DIAGNOSIS — Z9081 Acquired absence of spleen: Secondary | ICD-10-CM

## 2017-09-27 DIAGNOSIS — F17210 Nicotine dependence, cigarettes, uncomplicated: Secondary | ICD-10-CM | POA: Diagnosis present

## 2017-09-27 DIAGNOSIS — Z809 Family history of malignant neoplasm, unspecified: Secondary | ICD-10-CM

## 2017-09-27 DIAGNOSIS — Z716 Tobacco abuse counseling: Secondary | ICD-10-CM

## 2017-09-27 DIAGNOSIS — Z8544 Personal history of malignant neoplasm of other female genital organs: Secondary | ICD-10-CM

## 2017-09-27 DIAGNOSIS — E1110 Type 2 diabetes mellitus with ketoacidosis without coma: Principal | ICD-10-CM | POA: Diagnosis present

## 2017-09-27 DIAGNOSIS — Z7982 Long term (current) use of aspirin: Secondary | ICD-10-CM

## 2017-09-27 DIAGNOSIS — E871 Hypo-osmolality and hyponatremia: Secondary | ICD-10-CM | POA: Diagnosis present

## 2017-09-27 DIAGNOSIS — Z9119 Patient's noncompliance with other medical treatment and regimen: Secondary | ICD-10-CM

## 2017-09-27 DIAGNOSIS — F419 Anxiety disorder, unspecified: Secondary | ICD-10-CM | POA: Diagnosis present

## 2017-09-27 LAB — URINALYSIS, COMPLETE (UACMP) WITH MICROSCOPIC
Bacteria, UA: NONE SEEN
Bilirubin Urine: NEGATIVE
Ketones, ur: 80 mg/dL — AB
Nitrite: NEGATIVE
PH: 6 (ref 5.0–8.0)
Protein, ur: NEGATIVE mg/dL
Specific Gravity, Urine: 1.027 (ref 1.005–1.030)

## 2017-09-27 LAB — CBC
HCT: 47 % (ref 35.0–47.0)
Hemoglobin: 15.8 g/dL (ref 12.0–16.0)
MCH: 32.9 pg (ref 26.0–34.0)
MCHC: 33.7 g/dL (ref 32.0–36.0)
MCV: 97.8 fL (ref 80.0–100.0)
Platelets: 214 10*3/uL (ref 150–440)
RBC: 4.8 MIL/uL (ref 3.80–5.20)
RDW: 14.3 % (ref 11.5–14.5)
WBC: 10.9 10*3/uL (ref 3.6–11.0)

## 2017-09-27 LAB — COMPREHENSIVE METABOLIC PANEL
ALT: 10 U/L — ABNORMAL LOW (ref 14–54)
AST: 9 U/L — AB (ref 15–41)
Albumin: 4 g/dL (ref 3.5–5.0)
Alkaline Phosphatase: 82 U/L (ref 38–126)
Anion gap: 18 — ABNORMAL HIGH (ref 5–15)
BUN: 15 mg/dL (ref 6–20)
CO2: 16 mmol/L — ABNORMAL LOW (ref 22–32)
CREATININE: 0.66 mg/dL (ref 0.44–1.00)
Calcium: 9.5 mg/dL (ref 8.9–10.3)
Chloride: 95 mmol/L — ABNORMAL LOW (ref 101–111)
GFR calc Af Amer: >60 mL/min (ref >60)
Glucose, Bld: 436 mg/dL — ABNORMAL HIGH (ref 65–99)
POTASSIUM: 3.5 mmol/L (ref 3.5–5.1)
Sodium: 129 mmol/L — ABNORMAL LOW (ref 135–145)
TOTAL PROTEIN: 7.9 g/dL (ref 6.5–8.1)
Total Bilirubin: 1.8 mg/dL — ABNORMAL HIGH (ref 0.3–1.2)

## 2017-09-27 LAB — GLUCOSE, CAPILLARY: Glucose-Capillary: 441 mg/dL — ABNORMAL HIGH (ref 65–99)

## 2017-09-27 NOTE — ED Triage Notes (Signed)
Pt in with co low abd pain for over a week has recently been treated with cipro but still having dysuria. Also co high fsbs at home, states today 500.

## 2017-09-28 ENCOUNTER — Encounter: Payer: Self-pay | Admitting: Radiology

## 2017-09-28 ENCOUNTER — Emergency Department: Payer: Medicaid Other

## 2017-09-28 DIAGNOSIS — F419 Anxiety disorder, unspecified: Secondary | ICD-10-CM | POA: Diagnosis present

## 2017-09-28 DIAGNOSIS — Z809 Family history of malignant neoplasm, unspecified: Secondary | ICD-10-CM | POA: Diagnosis not present

## 2017-09-28 DIAGNOSIS — Z794 Long term (current) use of insulin: Secondary | ICD-10-CM | POA: Diagnosis not present

## 2017-09-28 DIAGNOSIS — N9089 Other specified noninflammatory disorders of vulva and perineum: Secondary | ICD-10-CM | POA: Diagnosis present

## 2017-09-28 DIAGNOSIS — E871 Hypo-osmolality and hyponatremia: Secondary | ICD-10-CM | POA: Diagnosis present

## 2017-09-28 DIAGNOSIS — K219 Gastro-esophageal reflux disease without esophagitis: Secondary | ICD-10-CM | POA: Diagnosis present

## 2017-09-28 DIAGNOSIS — Z8544 Personal history of malignant neoplasm of other female genital organs: Secondary | ICD-10-CM | POA: Diagnosis not present

## 2017-09-28 DIAGNOSIS — Z888 Allergy status to other drugs, medicaments and biological substances status: Secondary | ICD-10-CM | POA: Diagnosis not present

## 2017-09-28 DIAGNOSIS — F17210 Nicotine dependence, cigarettes, uncomplicated: Secondary | ICD-10-CM | POA: Diagnosis present

## 2017-09-28 DIAGNOSIS — Z79899 Other long term (current) drug therapy: Secondary | ICD-10-CM | POA: Diagnosis not present

## 2017-09-28 DIAGNOSIS — Z90711 Acquired absence of uterus with remaining cervical stump: Secondary | ICD-10-CM | POA: Diagnosis not present

## 2017-09-28 DIAGNOSIS — R300 Dysuria: Secondary | ICD-10-CM | POA: Diagnosis present

## 2017-09-28 DIAGNOSIS — Z885 Allergy status to narcotic agent status: Secondary | ICD-10-CM | POA: Diagnosis not present

## 2017-09-28 DIAGNOSIS — Z8249 Family history of ischemic heart disease and other diseases of the circulatory system: Secondary | ICD-10-CM | POA: Diagnosis not present

## 2017-09-28 DIAGNOSIS — E785 Hyperlipidemia, unspecified: Secondary | ICD-10-CM | POA: Diagnosis present

## 2017-09-28 DIAGNOSIS — Z8262 Family history of osteoporosis: Secondary | ICD-10-CM | POA: Diagnosis not present

## 2017-09-28 DIAGNOSIS — Z716 Tobacco abuse counseling: Secondary | ICD-10-CM | POA: Diagnosis not present

## 2017-09-28 DIAGNOSIS — Z7982 Long term (current) use of aspirin: Secondary | ICD-10-CM | POA: Diagnosis not present

## 2017-09-28 DIAGNOSIS — E1110 Type 2 diabetes mellitus with ketoacidosis without coma: Secondary | ICD-10-CM | POA: Diagnosis present

## 2017-09-28 DIAGNOSIS — Z9081 Acquired absence of spleen: Secondary | ICD-10-CM | POA: Diagnosis not present

## 2017-09-28 DIAGNOSIS — Z9119 Patient's noncompliance with other medical treatment and regimen: Secondary | ICD-10-CM | POA: Diagnosis not present

## 2017-09-28 DIAGNOSIS — K861 Other chronic pancreatitis: Secondary | ICD-10-CM | POA: Diagnosis present

## 2017-09-28 DIAGNOSIS — F329 Major depressive disorder, single episode, unspecified: Secondary | ICD-10-CM | POA: Diagnosis present

## 2017-09-28 DIAGNOSIS — J449 Chronic obstructive pulmonary disease, unspecified: Secondary | ICD-10-CM | POA: Diagnosis present

## 2017-09-28 LAB — GLUCOSE, CAPILLARY
Glucose-Capillary: 475 mg/dL — ABNORMAL HIGH (ref 65–99)
Glucose-Capillary: 432 mg/dL — ABNORMAL HIGH (ref 65–99)
Glucose-Capillary: 326 mg/dL — ABNORMAL HIGH (ref 65–99)
Glucose-Capillary: 349 mg/dL — ABNORMAL HIGH (ref 65–99)
Glucose-Capillary: 287 mg/dL — ABNORMAL HIGH (ref 65–99)
Glucose-Capillary: 236 mg/dL — ABNORMAL HIGH (ref 65–99)
Glucose-Capillary: 154 mg/dL — ABNORMAL HIGH (ref 65–99)
Glucose-Capillary: 120 mg/dL — ABNORMAL HIGH (ref 65–99)
Glucose-Capillary: 157 mg/dL — ABNORMAL HIGH (ref 65–99)
Glucose-Capillary: 255 mg/dL — ABNORMAL HIGH (ref 65–99)
Glucose-Capillary: 262 mg/dL — ABNORMAL HIGH (ref 65–99)
Glucose-Capillary: 270 mg/dL — ABNORMAL HIGH (ref 65–99)
Glucose-Capillary: 175 mg/dL — ABNORMAL HIGH (ref 65–99)
Glucose-Capillary: 113 mg/dL — ABNORMAL HIGH (ref 65–99)
Glucose-Capillary: 219 mg/dL — ABNORMAL HIGH (ref 65–99)
Glucose-Capillary: 242 mg/dL — ABNORMAL HIGH (ref 65–99)

## 2017-09-28 LAB — BASIC METABOLIC PANEL
Anion gap: 5 (ref 5–15)
Anion gap: 4 — ABNORMAL LOW (ref 5–15)
BUN: 8 mg/dL (ref 6–20)
BUN: 9 mg/dL (ref 6–20)
CO2: 27 mmol/L (ref 22–32)
CO2: 25 mmol/L (ref 22–32)
Calcium: 8.5 mg/dL — ABNORMAL LOW (ref 8.9–10.3)
Calcium: 8.1 mg/dL — ABNORMAL LOW (ref 8.9–10.3)
Chloride: 106 mmol/L (ref 101–111)
Chloride: 110 mmol/L (ref 101–111)
Creatinine, Ser: 0.39 mg/dL — ABNORMAL LOW (ref 0.44–1.00)
Creatinine, Ser: <0.3 mg/dL — ABNORMAL LOW (ref 0.44–1.00)
GFR calc Af Amer: >60 mL/min (ref >60)
GFR calc non Af Amer: >60 mL/min (ref >60)
Glucose, Bld: 117 mg/dL — ABNORMAL HIGH (ref 65–99)
Glucose, Bld: 158 mg/dL — ABNORMAL HIGH (ref 65–99)
Potassium: 3.4 mmol/L — ABNORMAL LOW (ref 3.5–5.1)
Potassium: 3.9 mmol/L (ref 3.5–5.1)
Sodium: 138 mmol/L (ref 135–145)
Sodium: 139 mmol/L (ref 135–145)

## 2017-09-28 LAB — LIPASE, BLOOD: Lipase: 20 U/L (ref 11–51)

## 2017-09-28 MED ORDER — SODIUM CHLORIDE 0.9 % IV SOLN
INTRAVENOUS | Status: DC
  Administered 2017-09-28: 2.4 [IU]/h via INTRAVENOUS
  Filled 2017-09-28: qty 1

## 2017-09-28 MED ORDER — DEXTROSE-NACL 5-0.45 % IV SOLN
INTRAVENOUS | Status: DC
  Administered 2017-09-28: 09:00:00 via INTRAVENOUS

## 2017-09-28 MED ORDER — LORAZEPAM 0.5 MG PO TABS
ORAL_TABLET | ORAL | Status: AC
  Administered 2017-09-28: 0.5 mg via ORAL
  Filled 2017-09-28: qty 1

## 2017-09-28 MED ORDER — ENOXAPARIN SODIUM 40 MG/0.4ML ~~LOC~~ SOLN
40.0000 mg | SUBCUTANEOUS | Status: DC
  Administered 2017-09-28: 40 mg via SUBCUTANEOUS
  Filled 2017-09-28: qty 0.4

## 2017-09-28 MED ORDER — POTASSIUM CHLORIDE 10 MEQ/100ML IV SOLN
10.0000 meq | INTRAVENOUS | Status: AC
  Administered 2017-09-28 (×2): 10 meq via INTRAVENOUS
  Filled 2017-09-28 (×2): qty 100

## 2017-09-28 MED ORDER — MORPHINE SULFATE (PF) 4 MG/ML IV SOLN
4.0000 mg | Freq: Once | INTRAVENOUS | Status: AC
  Administered 2017-09-28: 4 mg via INTRAVENOUS
  Filled 2017-09-28: qty 1

## 2017-09-28 MED ORDER — ONDANSETRON HCL 4 MG/2ML IJ SOLN
4.0000 mg | Freq: Once | INTRAMUSCULAR | Status: AC
  Administered 2017-09-28: 4 mg via INTRAVENOUS
  Filled 2017-09-28: qty 2

## 2017-09-28 MED ORDER — POTASSIUM CHLORIDE CRYS ER 20 MEQ PO TBCR
40.0000 meq | EXTENDED_RELEASE_TABLET | Freq: Once | ORAL | Status: AC
  Administered 2017-09-28: 40 meq via ORAL
  Filled 2017-09-28: qty 2

## 2017-09-28 MED ORDER — LORAZEPAM 0.5 MG PO TABS
0.5000 mg | ORAL_TABLET | Freq: Three times a day (TID) | ORAL | Status: DC | PRN
  Administered 2017-09-28 – 2017-09-29 (×4): 0.5 mg via ORAL
  Filled 2017-09-28 (×3): qty 1

## 2017-09-28 MED ORDER — SODIUM CHLORIDE 0.9 % IV BOLUS
1000.0000 mL | Freq: Once | INTRAVENOUS | Status: AC
  Administered 2017-09-28: 1000 mL via INTRAVENOUS

## 2017-09-28 MED ORDER — SUCRALFATE 1 G PO TABS
1.0000 g | ORAL_TABLET | Freq: Four times a day (QID) | ORAL | Status: DC
  Administered 2017-09-28 – 2017-09-29 (×6): 1 g via ORAL
  Filled 2017-09-28 (×6): qty 1

## 2017-09-28 MED ORDER — INSULIN ASPART 100 UNIT/ML ~~LOC~~ SOLN
0.0000 [IU] | Freq: Three times a day (TID) | SUBCUTANEOUS | Status: DC
  Administered 2017-09-29: 2 [IU] via SUBCUTANEOUS
  Administered 2017-09-29: 7 [IU] via SUBCUTANEOUS
  Filled 2017-09-28 (×2): qty 1

## 2017-09-28 MED ORDER — INSULIN REGULAR HUMAN 100 UNIT/ML IJ SOLN
INTRAMUSCULAR | Status: DC
  Administered 2017-09-28: 3.7 [IU]/h via INTRAVENOUS
  Filled 2017-09-28: qty 1

## 2017-09-28 MED ORDER — MORPHINE SULFATE (PF) 2 MG/ML IV SOLN
2.0000 mg | INTRAVENOUS | Status: DC | PRN
  Administered 2017-09-28 – 2017-09-29 (×5): 2 mg via INTRAVENOUS
  Filled 2017-09-28 (×5): qty 1

## 2017-09-28 MED ORDER — INSULIN DETEMIR 100 UNIT/ML ~~LOC~~ SOLN
20.0000 [IU] | Freq: Every day | SUBCUTANEOUS | Status: DC
  Administered 2017-09-28: 20 [IU] via SUBCUTANEOUS
  Filled 2017-09-28 (×2): qty 0.2

## 2017-09-28 MED ORDER — SODIUM CHLORIDE 0.9 % IV SOLN
INTRAVENOUS | Status: DC
  Administered 2017-09-28 – 2017-09-29 (×2): via INTRAVENOUS

## 2017-09-28 MED ORDER — ASPIRIN EC 81 MG PO TBEC
81.0000 mg | DELAYED_RELEASE_TABLET | Freq: Every day | ORAL | Status: DC
  Administered 2017-09-28 – 2017-09-29 (×2): 81 mg via ORAL
  Filled 2017-09-28 (×2): qty 1

## 2017-09-28 MED ORDER — INSULIN ASPART 100 UNIT/ML ~~LOC~~ SOLN
0.0000 [IU] | Freq: Every day | SUBCUTANEOUS | Status: DC
  Administered 2017-09-28: 2 [IU] via SUBCUTANEOUS
  Filled 2017-09-28: qty 1

## 2017-09-28 MED ORDER — FENTANYL CITRATE (PF) 100 MCG/2ML IJ SOLN
25.0000 ug | Freq: Once | INTRAMUSCULAR | Status: AC
  Administered 2017-09-28: 25 ug via INTRAVENOUS
  Filled 2017-09-28: qty 2

## 2017-09-28 MED ORDER — INSULIN DETEMIR 100 UNIT/ML ~~LOC~~ SOLN
30.0000 [IU] | SUBCUTANEOUS | Status: DC
  Administered 2017-09-28: 30 [IU] via SUBCUTANEOUS
  Filled 2017-09-28 (×2): qty 0.3

## 2017-09-28 MED ORDER — SODIUM CHLORIDE 0.9 % IV SOLN: INTRAVENOUS | Status: AC

## 2017-09-28 MED ORDER — IOPAMIDOL (ISOVUE-300) INJECTION 61%
100.0000 mL | Freq: Once | INTRAVENOUS | Status: AC | PRN
  Administered 2017-09-28: 100 mL via INTRAVENOUS

## 2017-09-28 MED ORDER — CITALOPRAM HYDROBROMIDE 20 MG PO TABS
20.0000 mg | ORAL_TABLET | Freq: Every day | ORAL | Status: DC
  Administered 2017-09-28 – 2017-09-29 (×2): 20 mg via ORAL
  Filled 2017-09-28 (×2): qty 1

## 2017-09-28 MED ORDER — PANTOPRAZOLE SODIUM 40 MG PO TBEC
40.0000 mg | DELAYED_RELEASE_TABLET | Freq: Every day | ORAL | Status: DC
  Administered 2017-09-28 – 2017-09-29 (×2): 40 mg via ORAL
  Filled 2017-09-28 (×2): qty 1

## 2017-09-28 NOTE — ED Provider Notes (Signed)
[facility] Emergency Department Provider Note    First MD Initiated Contact with Patient 09/28/17 0010     (approximate)  I have reviewed the triage vital signs and the nursing notes.   HISTORY  Chief Complaint Abdominal Pain    HPI Renee Mack is a 52 y.o. female with history of diabetes mellitus, DKA, recent urinary tract infection presents to the emergency department with generalized abdominal pain for approximately 1 week with dysuria and markedly elevated glucose at home.  Patient states current abdominal pain is 10 out of 10.  Patient denies any aggravating or alleviating factors.  Denies any diarrhea constipation vomiting or fever.   Past Medical History:  Diagnosis Date  . Cancer ([place])    vulvular  . COPD (chronic obstructive pulmonary disease) ([place])   . Diabetes mellitus without complication ([place])   . GERD (gastroesophageal reflux disease)   . HLD (hyperlipidemia)   . Pancreatitis   . Shingles     Patient Active Problem List   Diagnosis Date Noted  . Protein-calorie malnutrition, severe 07/18/2017  . DKA (diabetic ketoacidoses) ([place]) 07/17/2017  . Acute ischemic enteritis ([place]) 06/12/2017  . Pyelonephritis 12/28/2014  . Type 2 diabetes mellitus ([place]) 12/28/2014  . COPD (chronic obstructive pulmonary disease) ([place]) 12/28/2014  . GERD (gastroesophageal reflux disease) 12/28/2014  . History of shingles 12/28/2014  . HLD (hyperlipidemia) 12/28/2014    Past Surgical History:  Procedure Laterality Date  . ABDOMINAL HYSTERECTOMY     partial  . ECTOPIC PREGNANCY SURGERY Left   . LYMPHADENECTOMY    . SPLENECTOMY, PARTIAL    . vulvulasectomy      Prior to Admission medications   Medication Sig Start Date End Date Taking? Authorizing Provider  acetaminophen (TYLENOL) 325 MG tablet Take 650 mg by mouth every 6 (six) hours as needed for mild pain.    Yes [provider]  albuterol (PROAIR HFA) 108 (90 Base) MCG/ACT inhaler  Inhale 1 puff into the lungs every 6 (six) hours as needed for wheezing or shortness of breath.    Yes [provider]  aspirin 81 MG tablet Take 81 mg by mouth daily.   Yes [provider]  citalopram (CELEXA) 20 MG tablet Take 20 mg daily by mouth.   Yes [provider]  esomeprazole (NEXIUM) 40 MG capsule Take 40 mg by mouth daily at 12 noon.   Yes [provider]  insulin detemir (LEVEMIR) 100 UNIT/ML injection Take 30 units subcutaneously in the morning and 20 units at bedtime 07/20/17  Yes Gladstone Lighter, MD  insulin lispro (HUMALOG) 100 UNIT/ML injection Take per sliding scale with meals:  For blood sugars  <150- no coverage 151-200- 2 units 201-250- 4 units 251-300- 6 units 301-350- 8 units and >351 call MD 07/20/17 07/20/18 Yes Gladstone Lighter, MD  LORazepam (ATIVAN) 0.5 MG tablet Take 1 tablet (0.5 mg total) by mouth every 8 (eight) hours as needed for anxiety. 02/06/17 02/06/18 Yes Lisa Roca, MD  ondansetron (ZOFRAN ODT) 4 MG disintegrating tablet Take 1 tablet (4 mg total) by mouth every 8 (eight) hours as needed for nausea or vomiting. 09/01/17  Yes Alfred Levins, [place], MD  sucralfate (CARAFATE) 1 g tablet Take 1 tablet (1 g total) by mouth 4 (four) times daily. 08/01/17 08/01/18 Yes Earleen [place], MD  oxyCODONE-acetaminophen (PERCOCET) 5-325 MG tablet Take 2 tablets by mouth every 6 (six) hours as needed. Patient not taking: Reported on 09/01/2017 08/01/17   Earleen [place], MD  penicillin v potassium (VEETID) 250 MG tablet Take 1 tablet (250 mg total) by mouth 4 (four) times daily. Patient not taking: Reported on 09/01/2017 08/01/17   Earleen Newport, MD    Allergies Metformin and related; Darvon [propoxyphene]; Gabapentin; Nsaids; and Tramadol  Family History  Problem Relation Age of Onset  . Cancer Mother   . Osteoporosis Sister   . Heart disease Brother   . Heart attack Father     Social History Social History    Tobacco Use  . Smoking status: Current Every Day Smoker    Packs/day: 1.00    Years: 30.00    Pack years: 30.00    Types: Cigarettes  . Smokeless tobacco: Never Used  Substance Use Topics  . Alcohol use: No  . Drug use: No    Review of Systems Constitutional: No fever/chills Eyes: No visual changes. ENT: No sore throat. Cardiovascular: Denies chest pain. Respiratory: Denies shortness of breath. Gastrointestinal: Positive for abdominal pain.  No nausea, no vomiting.  No diarrhea.  No constipation. Genitourinary: Positive for for dysuria. Musculoskeletal: Negative for neck pain.  Negative for back pain. Integumentary: Negative for rash. Neurological: Negative for headaches, focal weakness or numbness. Endocrine: positive for hyperglycemia  ____________________________________________   PHYSICAL EXAM:  VITAL SIGNS: ED Triage Vitals [09/27/17 2025]  Enc Vitals Group     BP (!) 141/78     Pulse Rate 95     Resp 20     Temp 98.2 F (36.8 C)     Temp Source Oral     SpO2 98 %     Weight 59 kg (130 lb)     Height 1.549 m (5\' 1" )     Head Circumference      Peak Flow      Pain Score 9     Pain Loc      Pain Edu?      Excl. in [place]?     Constitutional: Alert and oriented. Well appearing and in no acute distress. Eyes: Conjunctivae are normal.  Head: Atraumatic. Mouth/Throat: Mucous membranes are moist.  Oropharynx non-erythematous. Neck: No stridor.   Cardiovascular: Normal rate, regular rhythm. Good peripheral circulation. Grossly normal heart sounds. Respiratory: Normal respiratory effort.  No retractions. Lungs CTAB. Gastrointestinal: Generalized tenderness to palpation.  No distention.  Musculoskeletal: No lower extremity tenderness nor edema. No gross deformities of extremities. Neurologic:  Normal speech and language. No gross focal neurologic deficits are appreciated.  Skin:  Skin is warm, dry and intact. No rash noted. Psychiatric: Mood and affect are  normal. Speech and behavior are normal.  ____________________________________________   LABS (all labs ordered are listed, but only abnormal results are displayed)  Labs Reviewed  GLUCOSE, CAPILLARY - Abnormal; Notable for the following components:      Result Value   Glucose-Capillary 441 (*)    All other components within normal limits  COMPREHENSIVE METABOLIC PANEL - Abnormal; Notable for the following components:   Sodium 129 (*)    Chloride 95 (*)    CO2 16 (*)    Glucose, Bld 436 (*)    AST 9 (*)    ALT 10 (*)    Total Bilirubin 1.8 (*)    Anion gap 18 (*)    All other components within normal limits  URINALYSIS, COMPLETE (UACMP) WITH MICROSCOPIC - Abnormal; Notable for the following components:   Color, Urine STRAW (*)    APPearance CLEAR (*)    Glucose, UA >=500 (*)  Hgb urine dipstick SMALL (*)    Ketones, ur 80 (*)    Leukocytes, UA SMALL (*)    Squamous Epithelial / LPF 0-5 (*)    All other components within normal limits  GLUCOSE, CAPILLARY - Abnormal; Notable for the following components:   Glucose-Capillary 475 (*)    All other components within normal limits  GLUCOSE, CAPILLARY - Abnormal; Notable for the following components:   Glucose-Capillary 432 (*)    All other components within normal limits  CBC  LIPASE, BLOOD  GLUCOSE, CAPILLARY   _____________________________  RADIOLOGY I, [place] N Caydon Feasel, personally viewed and evaluated these images (plain radiographs) as part of my medical decision making, as well as reviewing the written report by the radiologist.  ED MD interpretation: No acute intra-abdominal findings  Official radiology report(s): Ct Abdomen Pelvis W Contrast  Result Date: 09/28/2017 CLINICAL DATA:  Acute abdominal pain.  History of vulvar cancer. EXAM: CT ABDOMEN AND PELVIS WITH CONTRAST TECHNIQUE: Multidetector CT imaging of the abdomen and pelvis was performed using the standard protocol following bolus administration of  intravenous contrast. CONTRAST:  16mL ISOVUE-300 IOPAMIDOL (ISOVUE-300) INJECTION 61% COMPARISON:  CT 07/04/2017 FINDINGS: Lower chest: Minimal hypoventilatory change at the lung bases. No pleural fluid or consolidation. Hepatobiliary: Mild hepatic steatosis. No focal lesion. Gallbladder partially distended. Stable prominent common bile duct measuring 7 mm. No calcified gallstone or choledocholithiasis. No pericholecystic inflammation. Pancreas: No ductal dilatation or inflammation. Spleen: Normal in size without focal abnormality. Small splenule inferiorly. Adrenals/Urinary Tract: Stable mild left adrenal thickening without nodule. Normal right adrenal gland. No hydronephrosis or perinephric edema. Homogeneous renal enhancement with symmetric excretion on delayed phase imaging. Urinary bladder is physiologically distended without wall thickening. Stomach/Bowel: Stable Richter type hernia of the transverse colon involving the anti mesenteric border. No colonic wall thickening or inflammatory change. Moderate volume of stool throughout the colon. No colonic wall thickening or inflammation. Normal appendix. Stomach is distended with ingested contents. No small bowel obstruction, inflammatory change or wall thickening. Vascular/Lymphatic: Calcified and noncalcified atheromatous plaque throughout the abdominal aorta and its branches. No acute vascular findings. No retroperitoneal adenopathy. 12 mm left inguinal node has central low density suggesting necrosis. This is unchanged in size from prior exam. Unchanged 7 mm right external iliac node. No new enlarged abdominal or pelvic lymph nodes. Reproductive: Post hysterectomy. Minimal fluid in the vaginal cuff. There is soft tissue prominence of the perineum/vulva that appears similar to prior. No adnexal mass. Other: Small fat containing umbilical hernia. Fat containing supraumbilical ventral abdominal wall hernia. No ascites or free air. No intra-abdominal abscess.  Musculoskeletal: There are no acute or suspicious osseous abnormalities. Mild degenerative change in the spine. IMPRESSION: 1. Post hysterectomy. Minimal fluid in the vaginal cuff is new from prior exam, of uncertain significance given history of the vulva cancer. Stable perineal/vulvar soft tissue prominence from prior, may be postsurgical but is nonspecific. 2. Mildly enlarged left inguinal node measuring 12 mm is similar in size to prior exam, central low densities suggest necrosis, and raises concern for metastatic disease. 3. Unchanged fat containing umbilical and upper ventral abdominal wall hernias. Unchanged upper abdominal Richter's hernia involving the transverse colon without wall thickening or inflammation. No obstruction. 4. Moderate colonic stool burden suggesting constipation. 5.  Aortic Atherosclerosis (ICD10-I70.0). Electronically Signed   By: Jeb Levering M.D.   On: 09/28/2017 01:57    ____________________ .Critical Care Performed by: Gregor Hams, MD Authorized by: Gregor Hams, MD   Critical care provider  statement:    Critical care time (minutes):  60   Critical care time was exclusive of:  Separately billable procedures and treating other patients and teaching time   Critical care was necessary to treat or prevent imminent or life-threatening deterioration of the following conditions:  Metabolic crisis   Critical care was time spent personally by me on the following activities:  Development of treatment plan with patient or surrogate, discussions with consultants, evaluation of patient's response to treatment, examination of patient, obtaining history from patient or surrogate, ordering and performing treatments and interventions, ordering and review of laboratory studies, ordering and review of radiographic studies, pulse oximetry, re-evaluation of patient's condition and review of old charts   I assumed direction of critical care for this patient from another  provider in my specialty: no       ____________________________________________   INITIAL IMPRESSION / ASSESSMENT AND PLAN / ED COURSE  As part of my medical decision making, I reviewed the following data within the electronic MEDICAL RECORD NUMBER   52 year old female presenting with above-stated history and physical exam concerning for DKA versus intra-abdominal pathology versus urinary tract infection possibly pyelonephritis.  Laboratory data consistent with DKA as patient has an elevated anion gap in the setting of hyperglycemia.  Patient given 2 L IV normal saline and insulin infusion initiated.  CT scan revealed no acute intra-abdominal pathology to explain the patient's abdominal discomfort.  Given the fact that there are no available active care unit beds at present patient is being managed in the emergency department.  As she awaits an ICU bed. ____________________________________________  FINAL CLINICAL IMPRESSION(S) / ED DIAGNOSES  Final diagnoses:  Diabetic ketoacidosis without coma associated with type 2 diabetes mellitus ([place])     MEDICATIONS GIVEN DURING THIS VISIT:  Medications  insulin regular (NOVOLIN R,HUMULIN R) 100 Units in sodium chloride 0.9 % 100 mL (1 Units/mL) infusion (2.7 Units/hr Intravenous Rate/Dose Change 09/28/17 0444)  sodium chloride 0.9 % bolus 1,000 mL (0 mLs Intravenous Stopped 09/28/17 0259)  sodium chloride 0.9 % bolus 1,000 mL (0 mLs Intravenous Stopped 09/28/17 0259)  morphine 4 MG/ML injection 4 mg (4 mg Intravenous Given 09/28/17 0042)  ondansetron (ZOFRAN) injection 4 mg (4 mg Intravenous Given 09/28/17 0042)  iopamidol (ISOVUE-300) 61 % injection 100 mL (100 mLs Intravenous Contrast Given 09/28/17 0054)     ED Discharge Orders    None       Note:  This document was prepared using Dragon voice recognition software and may include unintentional dictation errors.    Gregor Hams, MD 09/28/17 [phone]

## 2017-09-28 NOTE — ED Notes (Signed)
Spoke with Dr. Marcille Blanco, informed him that pts blood pressure is 94/68, pt is requesting pain medication. Verbal order given for Fentanyl 25 mcg.

## 2017-09-28 NOTE — ED Notes (Signed)
Called and spoke with Santiago Glad in the pharmacy who confirmed OK to run potassium with D5-0.45% NS.

## 2017-09-28 NOTE — ED Notes (Signed)
Spoke with Dr. Verdell Carmine who wants to continue insulin drip until results from most recent BMP are back. RN will restart insulin drip after checking CBG.   RN also informed Dr. Verdell Carmine that pt states that she takes Ativan 0.5 mg every 8 hours at home and is requesting that this be ordered.

## 2017-09-28 NOTE — ED Notes (Signed)
Message sent to pharmacy requesting insulin dose sent to ED

## 2017-09-28 NOTE — Progress Notes (Signed)
[place] [facility] [place] NAME: Renee Mack    MR#:  [id number]  DATE OF BIRTH:  02/19/1966  SUBJECTIVE:   She is here due to vague abdominal pain and noted to be in acute diabetic ketoacidosis. Anion gap has improve, patient was given IV fluids and placed on insulin drip overnight and her anion gap is now closed and her bicarb improved.  We will transition off from insulin drip to scheduled insulin now.  REVIEW OF SYSTEMS:    Review of Systems  Constitutional: Negative for chills and fever.  HENT: Negative for congestion and tinnitus.   Eyes: Negative for blurred vision and double vision.  Respiratory: Negative for cough, shortness of breath and wheezing.   Cardiovascular: Negative for chest pain, orthopnea and PND.  Gastrointestinal: Positive for abdominal pain. Negative for diarrhea, nausea and vomiting.  Genitourinary: Negative for dysuria and hematuria.  Neurological: Negative for dizziness, sensory change and focal weakness.  All other systems reviewed and are negative.   Nutrition: Heart Heathy/Carb control Tolerating Diet: Yes Tolerating PT: Await Eval.   DRUG ALLERGIES:   Allergies  Allergen Reactions  . Metformin And Related Shortness Of Breath  . Darvon [Propoxyphene] Itching  . Gabapentin Swelling  . Nsaids Other (See Comments)    Ulcers   . Tramadol Hives    VITALS:  Blood pressure 111/62, pulse 77, temperature 98.2 F (36.8 C), temperature source Oral, resp. rate 10, height 5\' 1"  (1.549 m), weight 59 kg (130 lb), SpO2 95 %.  PHYSICAL EXAMINATION:   Physical Exam  GENERAL:  52 y.o.-year-old patient lying in bed in no acute distress.  EYES: Pupils equal, round, reactive to light and accommodation. No scleral icterus. Extraocular muscles intact.  HEENT: Head atraumatic, normocephalic. Oropharynx and nasopharynx clear. Dry oral mucosa NECK:  Supple, no jugular venous distention. No thyroid enlargement, no tenderness.   LUNGS: Normal breath sounds bilaterally, no wheezing, rales, rhonchi. No use of accessory muscles of respiration.  CARDIOVASCULAR: S1, S2 normal. No murmurs, rubs, or gallops.  ABDOMEN: Soft, generally tender, ND, nondistended. Bowel sounds present. No organomegaly or mass.  EXTREMITIES: No cyanosis, clubbing or edema b/l.    NEUROLOGIC: Cranial nerves II through XII are intact. No focal Motor or sensory deficits b/l. Globally weak.  PSYCHIATRIC: The patient is alert and oriented x 3.  SKIN: No obvious rash, lesion, or ulcer.    LABORATORY PANEL:   CBC Recent Labs  Lab 09/27/17 2032  WBC 10.9  HGB 15.8  HCT 47.0  PLT 214   ------------------------------------------------------------------------------------------------------------------  Chemistries  Recent Labs  Lab 09/27/17 2032 09/28/17 1100  NA 129* 138  K 3.5 3.4*  CL 95* 106  CO2 16* 27  GLUCOSE 436* 117*  BUN 15 8  CREATININE 0.66 0.39*  CALCIUM 9.5 8.5*  AST 9*  --   ALT 10*  --   ALKPHOS 82  --   BILITOT 1.8*  --    ------------------------------------------------------------------------------------------------------------------  Cardiac Enzymes No results for input(s): TROPONINI in the last 168 hours. ------------------------------------------------------------------------------------------------------------------  RADIOLOGY:  Ct Abdomen Pelvis W Contrast  Result Date: 09/28/2017 CLINICAL DATA:  Acute abdominal pain.  History of vulvar cancer. EXAM: CT ABDOMEN AND PELVIS WITH CONTRAST TECHNIQUE: Multidetector CT imaging of the abdomen and pelvis was performed using the standard protocol following bolus administration of intravenous contrast. CONTRAST:  165mL ISOVUE-300 IOPAMIDOL (ISOVUE-300) INJECTION 61% COMPARISON:  CT 07/04/2017 FINDINGS: Lower chest: Minimal hypoventilatory change at the lung bases. No pleural  fluid or consolidation. Hepatobiliary: Mild hepatic steatosis. No focal lesion. Gallbladder  partially distended. Stable prominent common bile duct measuring 7 mm. No calcified gallstone or choledocholithiasis. No pericholecystic inflammation. Pancreas: No ductal dilatation or inflammation. Spleen: Normal in size without focal abnormality. Small splenule inferiorly. Adrenals/Urinary Tract: Stable mild left adrenal thickening without nodule. Normal right adrenal gland. No hydronephrosis or perinephric edema. Homogeneous renal enhancement with symmetric excretion on delayed phase imaging. Urinary bladder is physiologically distended without wall thickening. Stomach/Bowel: Stable Richter type hernia of the transverse colon involving the anti mesenteric border. No colonic wall thickening or inflammatory change. Moderate volume of stool throughout the colon. No colonic wall thickening or inflammation. Normal appendix. Stomach is distended with ingested contents. No small bowel obstruction, inflammatory change or wall thickening. Vascular/Lymphatic: Calcified and noncalcified atheromatous plaque throughout the abdominal aorta and its branches. No acute vascular findings. No retroperitoneal adenopathy. 12 mm left inguinal node has central low density suggesting necrosis. This is unchanged in size from prior exam. Unchanged 7 mm right external iliac node. No new enlarged abdominal or pelvic lymph nodes. Reproductive: Post hysterectomy. Minimal fluid in the vaginal cuff. There is soft tissue prominence of the perineum/vulva that appears similar to prior. No adnexal mass. Other: Small fat containing umbilical hernia. Fat containing supraumbilical ventral abdominal wall hernia. No ascites or free air. No intra-abdominal abscess. Musculoskeletal: There are no acute or suspicious osseous abnormalities. Mild degenerative change in the spine. IMPRESSION: 1. Post hysterectomy. Minimal fluid in the vaginal cuff is new from prior exam, of uncertain significance given history of the vulva cancer. Stable perineal/vulvar soft  tissue prominence from prior, may be postsurgical but is nonspecific. 2. Mildly enlarged left inguinal node measuring 12 mm is similar in size to prior exam, central low densities suggest necrosis, and raises concern for metastatic disease. 3. Unchanged fat containing umbilical and upper ventral abdominal wall hernias. Unchanged upper abdominal Richter's hernia involving the transverse colon without wall thickening or inflammation. No obstruction. 4. Moderate colonic stool burden suggesting constipation. 5.  Aortic Atherosclerosis (ICD10-I70.0). Electronically Signed   By: Jeb Levering M.D.   On: 09/28/2017 01:57     ASSESSMENT AND PLAN:   51 year old female with past medical history of COPD, diabetes, GERD, chronic pancreatitis, hyperlipidemia who presented to the hospital due to abdominal pain and nausea and noted to be in acute diabetic ketoacidosis.  Next  1.  Acute diabetic ketoacidosis-secondary to medical noncompliance.  Patient's A1c is 15. -Patient was given IV fluids, placed on insulin drip, anion gap is now closed, bicarb is improved. - We will transition her off insulin drip placed on scheduled Levemir with sliding scale insulin.  Follow blood sugars. -Diet advanced to a carb controlled diet.  Appreciate diabetes coronary input.  2.  Hyponatremia- likely pseudohyponatremia secondary to hyperglycemia. -Blood sugars have corrected and sodium is improved.  3.  Anxiety- continue Ativan as needed.  4.  Depression-continue Celexa.  5. GERD - cont. Protonix.    All the records are reviewed and case discussed with Care Management/Social Worker. Management plans discussed with the patient, family and they are in agreement.  CODE STATUS: Full code  DVT Prophylaxis: Lovenox  TOTAL TIME TAKING CARE OF THIS PATIENT: 30 minutes.   POSSIBLE D/C IN 1-2 DAYS, DEPENDING ON CLINICAL CONDITION.   Henreitta Leber M.D on 09/28/2017 at 2:42 PM  Between 7am to 6pm - Pager -  [phone]  After 6pm go to [email or web address] - [facility]  Office  [phone]  CC: Primary care physician; Sherlyn Lees, [place]

## 2017-09-28 NOTE — ED Notes (Signed)
Beth, EDT to take patient to [facility] after 730p

## 2017-09-28 NOTE — ED Notes (Signed)
Message sent to pharmacy requesting missing dose be sent to ED.

## 2017-09-28 NOTE — ED Notes (Signed)
David RN, aware of bed assigned  

## 2017-09-28 NOTE — H&P (Signed)
Renee Mack is an 52 y.o. female.   Chief Complaint: Abdominal pain HPI: Patient with past medical history of diabetes, pancreatitis, vulvar cancer and COPD presents the emergency department complaining of abdominal pain.  She also states that she has had nausea and vomiting starting today.  It has been nonbloody and nonbilious.  In the emergency department patient's blood sugar was found to be elevated in addition to a positive anion gap.  She was started on insulin drip for management of diabetic ketoacidosis prior to the emergency department staff calling the hospitalist service for admission.  Past Medical History:  Diagnosis Date  . Cancer ([place])    vulvular  . COPD (chronic obstructive pulmonary disease) ([place])   . Diabetes mellitus without complication ([place])   . GERD (gastroesophageal reflux disease)   . HLD (hyperlipidemia)   . Pancreatitis   . Shingles     Past Surgical History:  Procedure Laterality Date  . ABDOMINAL HYSTERECTOMY     partial  . ECTOPIC PREGNANCY SURGERY Left   . LYMPHADENECTOMY    . SPLENECTOMY, PARTIAL    . vulvulasectomy      Family History  Problem Relation Age of Onset  . Cancer Mother   . Osteoporosis Sister   . Heart disease Brother   . Heart attack Father    Social History:  reports that she has been smoking cigarettes.  She has a 30.00 pack-year smoking history. She has never used smokeless tobacco. She reports that she does not drink alcohol or use drugs.  Allergies:  Allergies  Allergen Reactions  . Metformin And Related Shortness Of Breath  . Darvon [Propoxyphene] Itching  . Gabapentin Swelling  . Nsaids Other (See Comments)    Ulcers   . Tramadol Hives     (Not in a hospital admission)  Results for orders placed or performed during the hospital encounter of 09/27/17 (from the past 48 hour(s))  Glucose, capillary     Status: Abnormal   Collection Time: 09/27/17  8:25 PM  Result Value Ref Range   Glucose-Capillary 441 (H) 65 -  99 mg/dL  CBC     Status: None   Collection Time: 09/27/17  8:32 PM  Result Value Ref Range   WBC 10.9 3.6 - 11.0 K/uL   RBC 4.80 3.80 - 5.20 MIL/uL   Hemoglobin 15.8 12.0 - 16.0 g/dL   HCT 47.0 35.0 - 47.0 %   MCV 97.8 80.0 - 100.0 fL   MCH 32.9 26.0 - 34.0 pg   MCHC 33.7 32.0 - 36.0 g/dL   RDW 14.3 11.5 - 14.5 %   Platelets 214 150 - 440 K/uL    Comment: Performed at [facility], [address] [place], [place] [address]  Comprehensive metabolic panel     Status: Abnormal   Collection Time: 09/27/17  8:32 PM  Result Value Ref Range   Sodium 129 (L) 135 - 145 mmol/L   Potassium 3.5 3.5 - 5.1 mmol/L   Chloride 95 (L) 101 - 111 mmol/L   CO2 16 (L) 22 - 32 mmol/L   Glucose, Bld 436 (H) 65 - 99 mg/dL   BUN 15 6 - 20 mg/dL   Creatinine, Ser 0.66 0.44 - 1.00 mg/dL   Calcium 9.5 8.9 - 10.3 mg/dL   Total Protein 7.9 6.5 - 8.1 g/dL   Albumin 4.0 3.5 - 5.0 g/dL   AST 9 (L) 15 - 41 U/L   ALT 10 (L) 14 - 54 U/L   Alkaline  Phosphatase 82 38 - 126 U/L   Total Bilirubin 1.8 (H) 0.3 - 1.2 mg/dL   GFR calc non Af Amer >60 >60 mL/min   GFR calc Af Amer >60 >60 mL/min    Comment: (NOTE) The eGFR has been calculated using the CKD EPI equation. This calculation has not been validated in all clinical situations. eGFR's persistently <60 mL/min signify possible Chronic Kidney Disease.    Anion gap 18 (H) 5 - 15    Comment: Performed at [facility], [address] [place], [place] [address]  Urinalysis, Complete w Microscopic     Status: Abnormal   Collection Time: 09/27/17  8:32 PM  Result Value Ref Range   Color, Urine STRAW (A) YELLOW   APPearance CLEAR (A) CLEAR   Specific Gravity, Urine 1.027 1.005 - 1.030   pH 6.0 5.0 - 8.0   Glucose, UA >=500 (A) NEGATIVE mg/dL   Hgb urine dipstick SMALL (A) NEGATIVE   Bilirubin Urine NEGATIVE NEGATIVE   Ketones, ur 80 (A) NEGATIVE mg/dL   Protein, ur NEGATIVE NEGATIVE mg/dL   Nitrite NEGATIVE NEGATIVE   Leukocytes, UA  SMALL (A) NEGATIVE   RBC / HPF 0-5 0 - 5 RBC/hpf   WBC, UA 6-30 0 - 5 WBC/hpf   Bacteria, UA NONE SEEN NONE SEEN   Squamous Epithelial / LPF 0-5 (A) NONE SEEN   Mucus PRESENT     Comment: Performed at [facility], [address] [place], [place] [address]  Lipase, blood     Status: None   Collection Time: 09/27/17  8:32 PM  Result Value Ref Range   Lipase 20 11 - 51 U/L    Comment: Performed at [facility], [address] [place], [place] [address]  Glucose, capillary     Status: Abnormal   Collection Time: 09/28/17  2:14 AM  Result Value Ref Range   Glucose-Capillary 475 (H) 65 - 99 mg/dL  Glucose, capillary     Status: Abnormal   Collection Time: 09/28/17  3:36 AM  Result Value Ref Range   Glucose-Capillary 432 (H) 65 - 99 mg/dL  Glucose, capillary     Status: Abnormal   Collection Time: 09/28/17  4:41 AM  Result Value Ref Range   Glucose-Capillary 326 (H) 65 - 99 mg/dL  Glucose, capillary     Status: Abnormal   Collection Time: 09/28/17  5:44 AM  Result Value Ref Range   Glucose-Capillary 349 (H) 65 - 99 mg/dL  Glucose, capillary     Status: Abnormal   Collection Time: 09/28/17  6:48 AM  Result Value Ref Range   Glucose-Capillary 287 (H) 65 - 99 mg/dL  Glucose, capillary     Status: Abnormal   Collection Time: 09/28/17  7:59 AM  Result Value Ref Range   Glucose-Capillary 236 (H) 65 - 99 mg/dL  Glucose, capillary     Status: Abnormal   Collection Time: 09/28/17  9:04 AM  Result Value Ref Range   Glucose-Capillary 154 (H) 65 - 99 mg/dL   Ct Abdomen Pelvis W Contrast  Result Date: 09/28/2017 CLINICAL DATA:  Acute abdominal pain.  History of vulvar cancer. EXAM: CT ABDOMEN AND PELVIS WITH CONTRAST TECHNIQUE: Multidetector CT imaging of the abdomen and pelvis was performed using the standard protocol following bolus administration of intravenous contrast. CONTRAST:  141m ISOVUE-300 IOPAMIDOL (ISOVUE-300) INJECTION 61% COMPARISON:  CT 07/04/2017  FINDINGS: Lower chest: Minimal hypoventilatory change at the lung bases. No pleural fluid or consolidation. Hepatobiliary: Mild hepatic  steatosis. No focal lesion. Gallbladder partially distended. Stable prominent common bile duct measuring 7 mm. No calcified gallstone or choledocholithiasis. No pericholecystic inflammation. Pancreas: No ductal dilatation or inflammation. Spleen: Normal in size without focal abnormality. Small splenule inferiorly. Adrenals/Urinary Tract: Stable mild left adrenal thickening without nodule. Normal right adrenal gland. No hydronephrosis or perinephric edema. Homogeneous renal enhancement with symmetric excretion on delayed phase imaging. Urinary bladder is physiologically distended without wall thickening. Stomach/Bowel: Stable Richter type hernia of the transverse colon involving the anti mesenteric border. No colonic wall thickening or inflammatory change. Moderate volume of stool throughout the colon. No colonic wall thickening or inflammation. Normal appendix. Stomach is distended with ingested contents. No small bowel obstruction, inflammatory change or wall thickening. Vascular/Lymphatic: Calcified and noncalcified atheromatous plaque throughout the abdominal aorta and its branches. No acute vascular findings. No retroperitoneal adenopathy. 12 mm left inguinal node has central low density suggesting necrosis. This is unchanged in size from prior exam. Unchanged 7 mm right external iliac node. No new enlarged abdominal or pelvic lymph nodes. Reproductive: Post hysterectomy. Minimal fluid in the vaginal cuff. There is soft tissue prominence of the perineum/vulva that appears similar to prior. No adnexal mass. Other: Small fat containing umbilical hernia. Fat containing supraumbilical ventral abdominal wall hernia. No ascites or free air. No intra-abdominal abscess. Musculoskeletal: There are no acute or suspicious osseous abnormalities. Mild degenerative change in the spine.  IMPRESSION: 1. Post hysterectomy. Minimal fluid in the vaginal cuff is new from prior exam, of uncertain significance given history of the vulva cancer. Stable perineal/vulvar soft tissue prominence from prior, may be postsurgical but is nonspecific. 2. Mildly enlarged left inguinal node measuring 12 mm is similar in size to prior exam, central low densities suggest necrosis, and raises concern for metastatic disease. 3. Unchanged fat containing umbilical and upper ventral abdominal wall hernias. Unchanged upper abdominal Richter's hernia involving the transverse colon without wall thickening or inflammation. No obstruction. 4. Moderate colonic stool burden suggesting constipation. 5.  Aortic Atherosclerosis (ICD10-I70.0). Electronically Signed   By: Jeb Levering M.D.   On: 09/28/2017 01:57    Review of Systems  Constitutional: Positive for malaise/fatigue. Negative for chills and fever.  HENT: Negative for sore throat and tinnitus.   Eyes: Negative for blurred vision and redness.  Respiratory: Negative for cough and shortness of breath.   Cardiovascular: Negative for chest pain, palpitations, orthopnea and PND.  Gastrointestinal: Positive for abdominal pain, nausea and vomiting. Negative for diarrhea.  Genitourinary: Negative for dysuria, frequency and urgency.  Musculoskeletal: Positive for back pain. Negative for joint pain and myalgias.  Skin: Negative for rash.       No lesions  Neurological: Negative for speech change, focal weakness and weakness.  Endo/Heme/Allergies: Does not bruise/bleed easily.       No temperature intolerance  Psychiatric/Behavioral: Negative for depression and suicidal ideas.    Blood pressure 97/63, pulse 73, temperature 98.2 F (36.8 C), temperature source Oral, resp. rate 14, height _0  (1.549 m), weight 59 kg (130 lb), SpO2 97 %. Physical Exam  Vitals reviewed. Constitutional: She is oriented to person, place, and time. She appears well-developed and  well-nourished. No distress.  HENT:  Head: Normocephalic and atraumatic.  Mouth/Throat: Oropharynx is clear and moist.  Eyes: Pupils are equal, round, and reactive to light. Conjunctivae and EOM are normal. No scleral icterus.  Neck: Normal range of motion. Neck supple. No JVD present. No tracheal deviation present. No thyromegaly present.  Cardiovascular: Normal rate, regular rhythm  and normal heart sounds. Exam reveals no gallop and no friction rub.  No murmur heard. Respiratory: Effort normal and breath sounds normal.  GI: Soft. Bowel sounds are normal. She exhibits no distension. There is no tenderness.  Genitourinary:  Genitourinary Comments: Deferred  Musculoskeletal: Normal range of motion. She exhibits no edema.  Lymphadenopathy:    She has no cervical adenopathy.  Neurological: She is alert and oriented to person, place, and time. No cranial nerve deficit. She exhibits normal muscle tone.  Skin: Skin is warm and dry. No rash noted. No erythema.  Psychiatric: She has a normal mood and affect. Her behavior is normal. Judgment and thought content normal.     Assessment/Plan This is a 52 year old female admitted for DKA. 1.  Diabetic ketoacidosis: Continue insulin drip until anion gap closes at which time start basal insulin and allow the patient to eat. 2.  COPD: Stable; Albuterol as needed. 3.  Depression: Continue Celexa 4.  Vulvar cancer: The patient states that she has some irritating lesions of her perineum at this time.  Consult oncology for guidance. 5.  DVT prophylaxis: [place] 6.  GI prophylaxis: Pantoprazole The patient is a full code.  Time spent on admission orders and critical care approximately 45 minutes discussed with [facility] telemedicine  Harrie Foreman, MD 09/28/2017, 9:19 AM

## 2017-09-28 NOTE — ED Notes (Signed)
Samantha RN verified rate insulin drip at 2.5units/hr

## 2017-09-28 NOTE — Progress Notes (Signed)
Inpatient Diabetes Program Recommendations  AACE/ADA: New Consensus Statement on Inpatient Glycemic Control (2015)  Target Ranges:  Prepandial:   less than 140 mg/dL      Peak postprandial:   less than 180 mg/dL (1-2 hours)      Critically ill patients:  140 - 180 mg/dL  Results for Renee Mack, Renee Mack (MRN [id number]) as of 09/28/2017 08:52  Ref. Range 09/27/2017 20:25 09/28/2017 02:14 09/28/2017 03:36 09/28/2017 04:41 09/28/2017 05:44 09/28/2017 06:48 09/28/2017 07:59 09/28/2017 09:04  Glucose-Capillary Latest Ref Range: 65 - 99 mg/dL 441 (H) 475 (H) 432 (H) 326 (H) 349 (H) 287 (H) 236 (H) 154 (H)   Results for Renee Mack, Renee Mack (MRN [id number]) as of 09/28/2017 08:52  Ref. Range 06/12/2017 15:37  Hemoglobin A1C Latest Ref Range: 4.8 - 5.6 % 15.8 (H)   Review of Glycemic Control  Outpatient Diabetes medications: Levemir 30 units QAM, Levemir 20 units QHS, Humalog 0-8 units (per correction scale) with meals Current orders for Inpatient glycemic control: IV insulin drip per DKA protocol  Inpatient Diabetes Program Recommendations:  Insulin - IV drip/GlucoStabilizer: Currently ordered IV insulin per DKA protocol. Will continue to require IV insulin drip unitl acidosis is resolved as determined by MD (CO2 >20, AG < 10-12). Insulin - Basal: At time of transition from IV to SQ insulin, please consider ordering Levemir 15 units BID. Correction (SSI): At time of transition from IV to SQ insulin, please consider ordering 0-15 units Q4H. HgbA1C: A1C 15.8% on 06/12/17.  Please consider ordering an A1C to evaluate glycemic control over the past 2-3 months.  Insulin - Meal Coverage: After patient has transitioned to SQ insulin and when diet resumed, if post prandial glucose is consistently elevated, may want to consider ordering Novolog 3 units TID with meals for meal coverage if patient eats at least 50% of meals.  Note: Patient was inpatient from 07/17/17 to 07/20/17. Diabetes Coordinator talked with patient at length  on 07/19/17 (see note for details) regarding DM control and A1C of 15.8% on 06/12/17. Patient did not have any insurance at that time and no insurance currently listed in chart. [facility] by CM on 07/18/17.  Initial glucose 441 mg/dl on 09/27/17 and patient was started on IV insulin which is still needed at this time. Will continue to follow along and make further recommendations as more data is collected.  Thanks, Barnie Alderman, RN, MSN, CDE Diabetes Coordinator Inpatient Diabetes Program [phone] (Team Pager from 8am to 5pm)

## 2017-09-28 NOTE — ED Notes (Signed)
Patient transported to 216

## 2017-09-29 ENCOUNTER — Encounter: Payer: Self-pay | Admitting: *Deleted

## 2017-09-29 LAB — GLUCOSE, CAPILLARY
Glucose-Capillary: 196 mg/dL — ABNORMAL HIGH (ref 65–99)
Glucose-Capillary: 313 mg/dL — ABNORMAL HIGH (ref 65–99)

## 2017-09-29 MED ORDER — INSULIN DETEMIR 100 UNIT/ML ~~LOC~~ SOLN: SUBCUTANEOUS | 0 refills | Status: DC

## 2017-09-29 MED ORDER — INSULIN DETEMIR 100 UNIT/ML ~~LOC~~ SOLN
33.0000 [IU] | Freq: Every day | SUBCUTANEOUS | Status: DC
  Filled 2017-09-29: qty 0.33

## 2017-09-29 NOTE — Discharge Summary (Addendum)
[facility] NAME: Renee Mack    MR#:  [id number]  DATE OF BIRTH:  01-Dec-1965  DATE OF ADMISSION:  09/27/2017 ADMITTING PHYSICIAN: Harrie Foreman, MD  DATE OF DISCHARGE: 09/29/2017  PRIMARY CARE PHYSICIAN: Sherlyn Lees, FNP    ADMISSION DIAGNOSIS:  Diabetic ketoacidosis without coma associated with type 2 diabetes mellitus ([place]) [E11.10]  DISCHARGE DIAGNOSIS:  Active Problems:   DKA (diabetic ketoacidoses) ([place])   SECONDARY DIAGNOSIS:   Past Medical History:  Diagnosis Date  . Cancer ([place])    vulvular  . COPD (chronic obstructive pulmonary disease) ([place])   . Diabetes mellitus without complication ([place])   . GERD (gastroesophageal reflux disease)   . HLD (hyperlipidemia)   . Pancreatitis   . Shingles     HOSPITAL COURSE:   52 year old female with past medical history of COPD, diabetes, GERD, chronic pancreatitis, hyperlipidemia who presented to the hospital due to abdominal pain and nausea and noted to be in acute diabetic ketoacidosis.    1.  Acute diabetic ketoacidosis: Patient's A1c is 15. DKA has resolved. I made some adjustments to her insulin regimen and referred her to Dr Gabriel Carina.   2.  Hyponatremia: Due to pseudohyponatremia secondary to hyperglycemia. Sodium level has improved.  3.  Anxiety: Continue Ativan as needed.  4.  Depression-continue Celexa.   5. Tobacco dependence: Patient is encouraged to quit smoking. Counseling was provided for 4 minutes.     DISCHARGE CONDITIONS AND DIET:   Stable Diabetic diet  CONSULTS OBTAINED:    DRUG ALLERGIES:   Allergies  Allergen Reactions  . Metformin And Related Shortness Of Breath  . Darvon [Propoxyphene] Itching  . Gabapentin Swelling  . Nsaids Other (See Comments)    Ulcers   . Tramadol Hives    DISCHARGE MEDICATIONS:   Allergies as of 09/29/2017      Reactions   Metformin And Related Shortness Of Breath   Darvon [propoxyphene]  Itching   Gabapentin Swelling   Nsaids Other (See Comments)   Ulcers   Tramadol Hives      Medication List    STOP taking these medications   oxyCODONE-acetaminophen 5-325 MG tablet Commonly known as:  PERCOCET   penicillin v potassium 250 MG tablet Commonly known as:  VEETID     TAKE these medications   acetaminophen 325 MG tablet Commonly known as:  TYLENOL Take 650 mg by mouth every 6 (six) hours as needed for mild pain.   aspirin 81 MG tablet Take 81 mg by mouth daily.   citalopram 20 MG tablet Commonly known as:  CELEXA Take 20 mg daily by mouth.   esomeprazole 40 MG capsule Commonly known as:  NEXIUM Take 40 mg by mouth daily at 12 noon.   insulin detemir 100 UNIT/ML injection Commonly known as:  LEVEMIR Take 33 units subcutaneously in the morning and 23 units at bedtime What changed:  additional instructions   insulin lispro 100 UNIT/ML injection Commonly known as:  HUMALOG Take per sliding scale with meals:  For blood sugars  <150- no coverage 151-200- 2 units 201-250- 4 units 251-300- 6 units 301-350- 8 units and >351 call MD   LORazepam 0.5 MG tablet Commonly known as:  ATIVAN Take 1 tablet (0.5 mg total) by mouth every 8 (eight) hours as needed for anxiety.   ondansetron 4 MG disintegrating tablet Commonly known as:  ZOFRAN ODT Take 1 tablet (4 mg total) by mouth every 8 (eight) hours as  needed for nausea or vomiting.   PROAIR HFA 108 (90 Base) MCG/ACT inhaler Generic drug:  albuterol Inhale 1 puff into the lungs every 6 (six) hours as needed for wheezing or shortness of breath.   sucralfate 1 g tablet Commonly known as:  CARAFATE Take 1 tablet (1 g total) by mouth 4 (four) times daily.         Today   CHIEF COMPLAINT:  Doing well this am   VITAL SIGNS:  Blood pressure 111/64, pulse 69, temperature (!) 97.5 F (36.4 C), temperature source Oral, resp. rate 20, height 5\' 1"  (1.549 m), weight 59 kg (130 lb), SpO2 98 %.   REVIEW  OF SYSTEMS:  Review of Systems  Constitutional: Negative.  Negative for chills, fever and malaise/fatigue.  HENT: Negative.  Negative for ear discharge, ear pain, hearing loss, nosebleeds and sore throat.   Eyes: Negative.  Negative for blurred vision and pain.  Respiratory: Negative.  Negative for cough, hemoptysis, shortness of breath and wheezing.   Cardiovascular: Negative.  Negative for chest pain, palpitations and leg swelling.  Gastrointestinal: Negative.  Negative for abdominal pain, blood in stool, diarrhea, nausea and vomiting.  Genitourinary: Negative.  Negative for dysuria.  Musculoskeletal: Negative.  Negative for back pain.  Skin: Negative.   Neurological: Negative for dizziness, tremors, speech change, focal weakness, seizures and headaches.  Endo/Heme/Allergies: Negative.  Does not bruise/bleed easily.  Psychiatric/Behavioral: Negative.  Negative for depression, hallucinations and suicidal ideas.     PHYSICAL EXAMINATION:  GENERAL:  52 y.o.-year-old patient lying in the bed with no acute distress.  NECK:  Supple, no jugular venous distention. No thyroid enlargement, no tenderness.  LUNGS: Normal breath sounds bilaterally, no wheezing, rales,rhonchi  No use of accessory muscles of respiration.  CARDIOVASCULAR: S1, S2 normal. No murmurs, rubs, or gallops.  ABDOMEN: Soft, non-tender, non-distended. Bowel sounds present. No organomegaly or mass.  EXTREMITIES: No pedal edema, cyanosis, or clubbing.  PSYCHIATRIC: The patient is alert and oriented x 3.  SKIN: No obvious rash, lesion, or ulcer.   DATA REVIEW:   CBC Recent Labs  Lab 09/27/17 2032  WBC 10.9  HGB 15.8  HCT 47.0  PLT 214    Chemistries  Recent Labs  Lab 09/27/17 2032  09/28/17 1727  NA 129*   < > 139  K 3.5   < > 3.9  CL 95*   < > 110  CO2 16*   < > 25  GLUCOSE 436*   < > 158*  BUN 15   < > 9  CREATININE 0.66   < > <0.30*  CALCIUM 9.5   < > 8.1*  AST 9*  --   --   ALT 10*  --   --   ALKPHOS  82  --   --   BILITOT 1.8*  --   --    < > = values in this interval not displayed.    Cardiac Enzymes No results for input(s): TROPONINI in the last 168 hours.  Microbiology Results  @[email or web address] @  RADIOLOGY:  Ct Abdomen Pelvis W Contrast  Result Date: 09/28/2017 CLINICAL DATA:  Acute abdominal pain.  History of vulvar cancer. EXAM: CT ABDOMEN AND PELVIS WITH CONTRAST TECHNIQUE: Multidetector CT imaging of the abdomen and pelvis was performed using the standard protocol following bolus administration of intravenous contrast. CONTRAST:  157mL ISOVUE-300 IOPAMIDOL (ISOVUE-300) INJECTION 61% COMPARISON:  CT 07/04/2017 FINDINGS: Lower chest: Minimal hypoventilatory change at the lung bases. No pleural fluid or consolidation. Hepatobiliary: Mild  hepatic steatosis. No focal lesion. Gallbladder partially distended. Stable prominent common bile duct measuring 7 mm. No calcified gallstone or choledocholithiasis. No pericholecystic inflammation. Pancreas: No ductal dilatation or inflammation. Spleen: Normal in size without focal abnormality. Small splenule inferiorly. Adrenals/Urinary Tract: Stable mild left adrenal thickening without nodule. Normal right adrenal gland. No hydronephrosis or perinephric edema. Homogeneous renal enhancement with symmetric excretion on delayed phase imaging. Urinary bladder is physiologically distended without wall thickening. Stomach/Bowel: Stable Richter type hernia of the transverse colon involving the anti mesenteric border. No colonic wall thickening or inflammatory change. Moderate volume of stool throughout the colon. No colonic wall thickening or inflammation. Normal appendix. Stomach is distended with ingested contents. No small bowel obstruction, inflammatory change or wall thickening. Vascular/Lymphatic: Calcified and noncalcified atheromatous plaque throughout the abdominal aorta and its branches. No acute vascular findings. No retroperitoneal adenopathy. 12 mm left  inguinal node has central low density suggesting necrosis. This is unchanged in size from prior exam. Unchanged 7 mm right external iliac node. No new enlarged abdominal or pelvic lymph nodes. Reproductive: Post hysterectomy. Minimal fluid in the vaginal cuff. There is soft tissue prominence of the perineum/vulva that appears similar to prior. No adnexal mass. Other: Small fat containing umbilical hernia. Fat containing supraumbilical ventral abdominal wall hernia. No ascites or free air. No intra-abdominal abscess. Musculoskeletal: There are no acute or suspicious osseous abnormalities. Mild degenerative change in the spine. IMPRESSION: 1. Post hysterectomy. Minimal fluid in the vaginal cuff is new from prior exam, of uncertain significance given history of the vulva cancer. Stable perineal/vulvar soft tissue prominence from prior, may be postsurgical but is nonspecific. 2. Mildly enlarged left inguinal node measuring 12 mm is similar in size to prior exam, central low densities suggest necrosis, and raises concern for metastatic disease. 3. Unchanged fat containing umbilical and upper ventral abdominal wall hernias. Unchanged upper abdominal Richter's hernia involving the transverse colon without wall thickening or inflammation. No obstruction. 4. Moderate colonic stool burden suggesting constipation. 5.  Aortic Atherosclerosis (ICD10-I70.0). Electronically Signed   By: Jeb Levering M.D.   On: 09/28/2017 01:57      Allergies as of 09/29/2017      Reactions   Metformin And Related Shortness Of Breath   Darvon [propoxyphene] Itching   Gabapentin Swelling   Nsaids Other (See Comments)   Ulcers   Tramadol Hives      Medication List    STOP taking these medications   oxyCODONE-acetaminophen 5-325 MG tablet Commonly known as:  PERCOCET   penicillin v potassium 250 MG tablet Commonly known as:  VEETID     TAKE these medications   acetaminophen 325 MG tablet Commonly known as:  TYLENOL Take  650 mg by mouth every 6 (six) hours as needed for mild pain.   aspirin 81 MG tablet Take 81 mg by mouth daily.   citalopram 20 MG tablet Commonly known as:  CELEXA Take 20 mg daily by mouth.   esomeprazole 40 MG capsule Commonly known as:  NEXIUM Take 40 mg by mouth daily at 12 noon.   insulin detemir 100 UNIT/ML injection Commonly known as:  LEVEMIR Take 33 units subcutaneously in the morning and 23 units at bedtime What changed:  additional instructions   insulin lispro 100 UNIT/ML injection Commonly known as:  HUMALOG Take per sliding scale with meals:  For blood sugars  <150- no coverage 151-200- 2 units 201-250- 4 units 251-300- 6 units 301-350- 8 units and >351 call MD   LORazepam 0.5  MG tablet Commonly known as:  ATIVAN Take 1 tablet (0.5 mg total) by mouth every 8 (eight) hours as needed for anxiety.   ondansetron 4 MG disintegrating tablet Commonly known as:  ZOFRAN ODT Take 1 tablet (4 mg total) by mouth every 8 (eight) hours as needed for nausea or vomiting.   PROAIR HFA 108 (90 Base) MCG/ACT inhaler Generic drug:  albuterol Inhale 1 puff into the lungs every 6 (six) hours as needed for wheezing or shortness of breath.   sucralfate 1 g tablet Commonly known as:  CARAFATE Take 1 tablet (1 g total) by mouth 4 (four) times daily.          Management plans discussed with the patient and she is in agreement. Stable for discharge home  Patient should follow up with pcp  CODE STATUS:     Code Status Orders  (From admission, onward)        Start     Ordered   09/28/17 1045  Full code  Continuous     09/28/17 1044    Code Status History    Date Active Date Inactive Code Status Order ID Comments User Context   07/17/2017 2004 07/20/2017 1840 Full Code [id number]  Henreitta Leber, MD ED   06/12/2017 2227 06/16/2017 1910 Full Code [id number]  Gorden Harms, MD Inpatient   12/28/2014 0305 12/28/2014 1919 Full Code [id number]  Lance Coon, MD Inpatient       TOTAL TIME TAKING CARE OF THIS PATIENT: 39 minutes.    Note: This dictation was prepared with Dragon dictation along with smaller phrase technology. Any transcriptional errors that result from this process are unintentional.  Marsheila Alejo M.D on 09/29/2017 at 10:10 AM  Between 7am to 6pm - Pager - [phone] After 6pm go to [email or web address] - password [email or web address] [facility]  Office  [phone]  CC: Primary care physician; Sherlyn Lees, FNP

## 2017-09-29 NOTE — Care Management (Signed)
Patient to discharge home today.  RNCM confirmed with patient that she is still using Medication Management.  Patient states that she has a supply of all of her medications in the home.  Patient PCP Sherlyn Lees.  RNCM signing off.

## 2017-09-29 NOTE — Progress Notes (Signed)
Nutrition Brief Note  Patient identified on the Malnutrition Screening Tool (MST) Report  52 y/o female with h/o DM admitted for DKA  Wt Readings from Last 15 Encounters:  09/27/17 130 lb (59 kg)  09/01/17 130 lb (59 kg)  07/17/17 126 lb 1.7 oz (57.2 kg)  07/14/17 133 lb (60.3 kg)  06/12/17 133 lb (60.3 kg)  04/29/17 118 lb (53.5 kg)  04/03/17 118 lb (53.5 kg)  03/21/17 130 lb (59 kg)  03/01/17 130 lb (59 kg)  02/06/17 129 lb (58.5 kg)  01/30/17 129 lb (58.5 kg)  05/02/16 135 lb (61.2 kg)  02/16/16 136 lb (61.7 kg)  01/28/15 151 lb (68.5 kg)  12/28/14 150 lb 4.8 oz (68.2 kg)    Body mass index is 24.56 kg/m. Patient meets criteria for normal wt based on current BMI.   Current diet order is HH/CHO, patient is consuming approximately 100% of meals at this time. Labs and medications reviewed.   Pt to d/c today. No nutrition interventions warranted at this time. If nutrition issues arise, please consult RD.   Koleen Distance MS, RD, LDN Pager #- [phone] After Hours Pager: [phone]

## 2017-09-29 NOTE — Progress Notes (Signed)
Nutrition Education Note   RD consulted for nutrition education regarding diabetes.   52 y/o female with h/o DM admitted with DKA  Lab Results  Component Value Date   HGBA1C 15.8 (H) 06/12/2017    RD provided "Nutrition and Type II Diabetes" handout from the Academy of [employer]etics. Discussed different food groups and their effects on blood sugar, emphasizing carbohydrate-containing foods. Provided list of carbohydrates and recommended serving sizes of common foods.  Discussed importance of controlled and consistent carbohydrate intake throughout the day. Provided examples of ways to balance meals/snacks and encouraged intake of high-fiber, whole grain complex carbohydrates. Teach back method used.  Expect fair compliance.  Body mass index is 24.56 kg/m. Pt meets criteria for normal weight based on current BMI.  Current diet order is HH/CHO, patient is consuming approximately 100% of meals at this time. Labs and medications reviewed. No further nutrition interventions warranted at this time. RD contact information provided. If additional nutrition issues arise, please re-consult RD.  Koleen Distance MS, RD, LDN Pager #- [phone] After Hours Pager: [phone]

## 2017-09-29 NOTE — Progress Notes (Signed)
Patient cleared for discharge by Dr Benjie Karvonen Education complete. AVS printed. Discharge instructions given. All questions answered for patient clarification.  Prescriptions given, pharmacy verified.  IV removed.  Discharged to home Via private vehicle

## 2017-10-27 ENCOUNTER — Emergency Department: Payer: Medicaid Other

## 2017-10-27 ENCOUNTER — Other Ambulatory Visit: Payer: Self-pay

## 2017-10-27 ENCOUNTER — Emergency Department
Admission: EM | Admit: 2017-10-27 | Discharge: 2017-10-27 | Disposition: A | Discharge status: Home / Self Care | Payer: Medicaid Other | Attending: Emergency Medicine | Admitting: Emergency Medicine

## 2017-10-27 DIAGNOSIS — Z8544 Personal history of malignant neoplasm of other female genital organs: Secondary | ICD-10-CM | POA: Insufficient documentation

## 2017-10-27 DIAGNOSIS — R739 Hyperglycemia, unspecified: Secondary | ICD-10-CM | POA: Diagnosis not present

## 2017-10-27 DIAGNOSIS — Z794 Long term (current) use of insulin: Secondary | ICD-10-CM | POA: Diagnosis not present

## 2017-10-27 DIAGNOSIS — F17210 Nicotine dependence, cigarettes, uncomplicated: Secondary | ICD-10-CM | POA: Diagnosis not present

## 2017-10-27 DIAGNOSIS — N390 Urinary tract infection, site not specified: Secondary | ICD-10-CM | POA: Insufficient documentation

## 2017-10-27 DIAGNOSIS — J449 Chronic obstructive pulmonary disease, unspecified: Secondary | ICD-10-CM | POA: Diagnosis not present

## 2017-10-27 DIAGNOSIS — Z79899 Other long term (current) drug therapy: Secondary | ICD-10-CM | POA: Diagnosis not present

## 2017-10-27 DIAGNOSIS — R109 Unspecified abdominal pain: Secondary | ICD-10-CM | POA: Diagnosis present

## 2017-10-27 LAB — GLUCOSE, CAPILLARY
GLUCOSE-CAPILLARY: 316 mg/dL — AB (ref 65–99)
Glucose-Capillary: 410 mg/dL — ABNORMAL HIGH (ref 65–99)
Glucose-Capillary: 309 mg/dL — ABNORMAL HIGH (ref 65–99)

## 2017-10-27 LAB — CBC WITH DIFFERENTIAL/PLATELET
Basophils Absolute: 0.1 10*3/uL (ref 0–0.1)
Basophils Relative: 1 %
Eosinophils Absolute: 0.1 10*3/uL (ref 0–0.7)
Eosinophils Relative: 1 %
HEMATOCRIT: 45.3 % (ref 35.0–47.0)
Hemoglobin: 15.6 g/dL (ref 12.0–16.0)
LYMPHS PCT: 33 %
Lymphs Abs: 3.6 10*3/uL (ref 1.0–3.6)
MCH: 33.8 pg (ref 26.0–34.0)
MCHC: 34.5 g/dL (ref 32.0–36.0)
MCV: 97.8 fL (ref 80.0–100.0)
MONO ABS: 0.7 10*3/uL (ref 0.2–0.9)
MONOS PCT: 7 %
NEUTROS ABS: 6.4 10*3/uL (ref 1.4–6.5)
Neutrophils Relative %: 58 %
Platelets: 216 10*3/uL (ref 150–440)
RBC: 4.63 MIL/uL (ref 3.80–5.20)
RDW: 13.2 % (ref 11.5–14.5)
WBC: 11 10*3/uL (ref 3.6–11.0)

## 2017-10-27 LAB — URINALYSIS, COMPLETE (UACMP) WITH MICROSCOPIC
Bilirubin Urine: NEGATIVE
Ketones, ur: 20 mg/dL — AB
NITRITE: NEGATIVE
PH: 7 (ref 5.0–8.0)
Protein, ur: NEGATIVE mg/dL
Specific Gravity, Urine: 1.017 (ref 1.005–1.030)

## 2017-10-27 LAB — COMPREHENSIVE METABOLIC PANEL
ALT: 16 U/L (ref 14–54)
AST: 20 U/L (ref 15–41)
Albumin: 3.8 g/dL (ref 3.5–5.0)
Alkaline Phosphatase: 115 U/L (ref 38–126)
Anion gap: 14 (ref 5–15)
BUN: 15 mg/dL (ref 6–20)
CHLORIDE: 92 mmol/L — AB (ref 101–111)
CO2: 23 mmol/L (ref 22–32)
CREATININE: 1.01 mg/dL — AB (ref 0.44–1.00)
Calcium: 9 mg/dL (ref 8.9–10.3)
Glucose, Bld: 485 mg/dL — ABNORMAL HIGH (ref 65–99)
Potassium: 3.9 mmol/L (ref 3.5–5.1)
Sodium: 129 mmol/L — ABNORMAL LOW (ref 135–145)
Total Bilirubin: 1.2 mg/dL (ref 0.3–1.2)
Total Protein: 7.5 g/dL (ref 6.5–8.1)

## 2017-10-27 MED ORDER — INSULIN ASPART 100 UNIT/ML ~~LOC~~ SOLN
6.0000 [IU] | Freq: Once | SUBCUTANEOUS | Status: AC
  Administered 2017-10-27: 6 [IU] via INTRAVENOUS
  Filled 2017-10-27: qty 1

## 2017-10-27 MED ORDER — PHENAZOPYRIDINE HCL 200 MG PO TABS: 200.0000 mg | ORAL_TABLET | Freq: Three times a day (TID) | ORAL | 0 refills | Status: DC | PRN

## 2017-10-27 MED ORDER — MORPHINE SULFATE (PF) 4 MG/ML IV SOLN
4.0000 mg | Freq: Once | INTRAVENOUS | Status: AC
  Administered 2017-10-27: 4 mg via INTRAVENOUS
  Filled 2017-10-27: qty 1

## 2017-10-27 MED ORDER — SODIUM CHLORIDE 0.9 % IV BOLUS
1000.0000 mL | Freq: Once | INTRAVENOUS | Status: AC
  Administered 2017-10-27: 1000 mL via INTRAVENOUS

## 2017-10-27 MED ORDER — ONDANSETRON HCL 4 MG/2ML IJ SOLN
4.0000 mg | Freq: Once | INTRAMUSCULAR | Status: AC
  Administered 2017-10-27: 4 mg via INTRAVENOUS
  Filled 2017-10-27: qty 2

## 2017-10-27 MED ORDER — SODIUM CHLORIDE 0.9 % IV SOLN
1.0000 g | Freq: Once | INTRAVENOUS | Status: AC
  Administered 2017-10-27: 1 g via INTRAVENOUS
  Filled 2017-10-27: qty 10

## 2017-10-27 MED ORDER — CEPHALEXIN 500 MG PO CAPS: 500.0000 mg | ORAL_CAPSULE | Freq: Three times a day (TID) | ORAL | 0 refills | Status: DC

## 2017-10-27 MED ORDER — HYDROCODONE-ACETAMINOPHEN 5-325 MG PO TABS: 1.0000 | ORAL_TABLET | ORAL | 0 refills | Status: DC | PRN

## 2017-10-27 NOTE — ED Notes (Signed)
Pt in reporting 9/10 sharp, stabbing, constant back and abdominal pain that is worse when she urinates. No alleviating factors; has tried acetaminophen and heat. Symptoms began around 1600 10/26/2017. Pt reports this is similar to her presentation in the past when she has had kidney stones.

## 2017-10-27 NOTE — ED Notes (Signed)
Dr. Veronese at bedside 

## 2017-10-27 NOTE — Discharge Instructions (Signed)
Please take your entire course of antibiotics.  Please take pain medication as needed but only as prescribed.  Return to the emergency department for any worsening pain, fever, or any other symptom personally concerning to yourself.  Otherwise please follow-up with your doctor in the next 2 to 3 days for recheck/reevaluation.

## 2017-10-27 NOTE — ED Notes (Signed)
Patient transported to CT 

## 2017-10-27 NOTE — ED Provider Notes (Signed)
-----------------------------------------   7:35 AM on 10/27/2017 -----------------------------------------   Blood pressure (!) 156/70, pulse 77, temperature 97.9 F (36.6 C), temperature source Oral, resp. rate 15, height 5\' 1"  (1.549 m), weight 59 kg (130 lb), SpO2 98 %.  Assuming care from Dr. Harlow Asa of [facility] Flock is a 52 y.o. female with a chief complaint of Abdominal Pain; Back Pain; and Dysuria .    Please refer to H&P by previous MD for further details.  Patient here with hyperglycemia in the setting of UTI.  No evidence of DKA.  Also complaining of left flank pain.  CT is pending to rule ou kidney stone.  Patient received Rocephin.  The current plan of care is to follow-up results of CT scan to rule out kidney stone with superimposed UTI.   _________________________ 7:36 AM on 10/27/2017 -----------------------------------------  Patient reports feeling improved however BP 86/50 with HR 65. Will give 2nd L. CT negative for stone.  I have personally reviewed the images performed during this visit and I agree with the Radiologist's read.  Interpretation by Radiologist:  Ct Renal Stone Study  Result Date: 10/27/2017 CLINICAL DATA:  Worsening flank and abdominal pain. Nephrolithiasis. EXAM: CT ABDOMEN AND PELVIS WITHOUT CONTRAST TECHNIQUE: Multidetector CT imaging of the abdomen and pelvis was performed following the standard protocol without IV contrast. COMPARISON:  09/28/2017 FINDINGS: Lower chest: No acute findings. Hepatobiliary:  No mass visualized on this unenhanced exam. Pancreas: No mass or inflammatory process visualized on this unenhanced exam. Spleen:  Within normal limits in size. Adrenals/Urinary tract: Mild renal vascular calcifications seen in the left renal hilum. No evidence of nephrolithiasis or hydronephrosis. No evidence of ureteral calculi or dilatation. Unremarkable unopacified urinary bladder. Stomach/Bowel: No evidence of obstruction, inflammatory process,  or abnormal fluid collections. Normal appendix visualized. Vascular/Lymphatic: No pathologically enlarged lymph nodes identified. No evidence of abdominal aortic aneurysm. Aortic atherosclerosis. Reproductive: Prior hysterectomy noted. Adnexal regions are unremarkable in appearance. Other: Small supraumbilical ventral hernia of the Richter type containing the wall of the transverse colon, without change since prior study. Musculoskeletal:  No suspicious bone lesions identified. IMPRESSION: No evidence of urolithiasis, hydronephrosis, or other acute findings. Stable small ventral abdominal wall hernia. Electronically Signed   By: Earle Gell M.D.   On: 10/27/2017 07:20     _________________________ 10:06 AM on 10/27/2017 -----------------------------------------  BP improved after 2nd L. Patient continues to endorse feeling markedly improved. No signs of sepsis. Repeat Temp 98.55F. Tolerating PO. Will dc home with prescriptions and instructions left by Dr. Elenore Rota, [place], MD 10/27/17 1007

## 2017-10-27 NOTE — ED Notes (Signed)
Pt verbalized understanding of discharge instructions. NAD at this time. 

## 2017-10-27 NOTE — ED Triage Notes (Signed)
Pt c/o lower abd pain that radiates into back with painful urination - she has a hx of kidney stones and states that this is what it feels like - she also reports hematuria

## 2017-10-27 NOTE — ED Provider Notes (Signed)
[facility] Emergency Department Provider Note  Time seen: 5:33 AM  I have reviewed the triage vital signs and the nursing notes.   HISTORY  Chief Complaint Abdominal Pain; Back Pain; and Dysuria    HPI Renee Mack is a 52 y.o. female with a past medical history of diabetes, gastric reflux, hyperlipidemia, COPD, presents to the emergency department for painful urination left flank pain.  According to the patient since afternoon she has had progressively worsening pain in her left flank which she describes as left abdomen radiating to left mid back.  States pain/sharp pain when urinating.  Denies hematuria.  States a history of kidney stones in the past which this feels somewhat similar.  Denies fever.  Denies nausea vomiting or diarrhea.  Scribes her pain is moderate to severe located in the left flank.   Past Medical History:  Diagnosis Date  . Cancer ([place])    vulvular  . COPD (chronic obstructive pulmonary disease) ([place])   . Diabetes mellitus without complication ([place])   . GERD (gastroesophageal reflux disease)   . HLD (hyperlipidemia)   . Pancreatitis   . Shingles     Patient Active Problem List   Diagnosis Date Noted  . Protein-calorie malnutrition, severe 07/18/2017  . DKA (diabetic ketoacidoses) ([place]) 07/17/2017  . Acute ischemic enteritis ([place]) 06/12/2017  . Pyelonephritis 12/28/2014  . Type 2 diabetes mellitus ([place]) 12/28/2014  . COPD (chronic obstructive pulmonary disease) ([place]) 12/28/2014  . GERD (gastroesophageal reflux disease) 12/28/2014  . History of shingles 12/28/2014  . HLD (hyperlipidemia) 12/28/2014    Past Surgical History:  Procedure Laterality Date  . ABDOMINAL HYSTERECTOMY     partial  . ECTOPIC PREGNANCY SURGERY Left   . LYMPHADENECTOMY    . SPLENECTOMY, PARTIAL    . vulvulasectomy      Prior to Admission medications   Medication Sig Start Date End Date Taking? Authorizing Provider  acetaminophen (TYLENOL) 325 MG  tablet Take 650 mg by mouth every 6 (six) hours as needed for mild pain.     [provider]  albuterol (PROAIR HFA) 108 (90 Base) MCG/ACT inhaler Inhale 1 puff into the lungs every 6 (six) hours as needed for wheezing or shortness of breath.     [provider]  aspirin 81 MG tablet Take 81 mg by mouth daily.    [provider]  citalopram (CELEXA) 20 MG tablet Take 20 mg daily by mouth.    [provider]  esomeprazole (NEXIUM) 40 MG capsule Take 40 mg by mouth daily at 12 noon.    [provider]  insulin detemir (LEVEMIR) 100 UNIT/ML injection Take 33 units subcutaneously in the morning and 23 units at bedtime 09/29/17   Bettey Costa, MD  insulin lispro (HUMALOG) 100 UNIT/ML injection Take per sliding scale with meals:  For blood sugars  <150- no coverage 151-200- 2 units 201-250- 4 units 251-300- 6 units 301-350- 8 units and >351 call MD 07/20/17 07/20/18  Gladstone Lighter, MD  LORazepam (ATIVAN) 0.5 MG tablet Take 1 tablet (0.5 mg total) by mouth every 8 (eight) hours as needed for anxiety. 02/06/17 02/06/18  Lisa Roca, MD  ondansetron (ZOFRAN ODT) 4 MG disintegrating tablet Take 1 tablet (4 mg total) by mouth every 8 (eight) hours as needed for nausea or vomiting. 09/01/17   Alfred Levins, [place], MD  sucralfate (CARAFATE) 1 g tablet Take 1 tablet (1 g total) by mouth 4 (four) times daily. 08/01/17 08/01/18  Earleen Newport, MD  Allergies  Allergen Reactions  . Metformin And Related Shortness Of Breath  . Darvon [Propoxyphene] Itching  . Gabapentin Swelling  . Nsaids Other (See Comments)    Ulcers   . Tramadol Hives    Family History  Problem Relation Age of Onset  . Cancer Mother   . Osteoporosis Sister   . Heart disease Brother   . Heart attack Father     Social History Social History   Tobacco Use  . Smoking status: Current Every Day Smoker    Packs/day: 0.50    Years: 30.00    Pack years: 15.00    Types: Cigarettes   . Smokeless tobacco: Never Used  Substance Use Topics  . Alcohol use: No  . Drug use: No    Review of Systems Constitutional: Negative for fever. Eyes: Negative for visual complaints ENT: Negative for recent illness/congestion Cardiovascular: Negative for chest pain. Respiratory: Negative for shortness of breath. Gastrointestinal: Positive for left flank pain.  Negative for nausea vomiting diarrhea Genitourinary: Negative for urinary compaints Musculoskeletal: Negative for musculoskeletal complaints Skin: Negative for skin complaints  Neurological: Negative for headache All other ROS negative  ____________________________________________   PHYSICAL EXAM:  VITAL SIGNS: ED Triage Vitals  Enc Vitals Group     BP 10/27/17 0101 (!) 156/70     Pulse Rate 10/27/17 0101 77     Resp 10/27/17 0101 15     Temp 10/27/17 0101 97.9 F (36.6 C)     Temp Source 10/27/17 0101 Oral     SpO2 10/27/17 0101 98 %     Weight 10/27/17 0101 130 lb (59 kg)     Height 10/27/17 0101 5\' 1"  (1.549 m)     Head Circumference --      Peak Flow --      Pain Score 10/27/17 0100 9     Pain Loc --      Pain Edu? --      Excl. in [place]? --     Constitutional: Alert and oriented.  Mild distress due to abdominal discomfort. Eyes: Normal exam ENT   Head: Normocephalic and atraumatic.   Mouth/Throat: Mucous membranes are moist. Cardiovascular: Normal rate, regular rhythm. No murmurs, rubs, or gallops. Respiratory: Normal respiratory effort without tachypnea nor retractions. Breath sounds are clear and equal bilaterally. No wheezes/rales/rhonchi. Gastrointestinal: Soft, mild suprapubic tenderness.  Mild left CVA tenderness.  No rebound or guarding.  No distention. Musculoskeletal: Nontender with normal range of motion in all extremities.  Neurologic:  Normal speech and language. No gross focal neurologic deficits Skin:  Skin is warm, dry and intact.  Psychiatric: Mood and affect are normal.    ____________________________________________   RADIOLOGY  IMPRESSION: No evidence of urolithiasis, hydronephrosis, or other acute findings.  Stable small ventral abdominal wall hernia.  ____________________________________________   INITIAL IMPRESSION / ASSESSMENT AND PLAN / ED COURSE  Pertinent labs & imaging results that were available during my care of the patient were reviewed by me and considered in my medical decision making (see chart for details).  Patient presents to the emergency department for left flank pain, dysuria.  Differential would include urinary tract infection, ureterolithiasis, pyelonephritis.  Patient has a history of kidney stones in the past which this feels similar.  Patient's labs show a significant urinary tract infection, significant hyperglycemia with pseudohyponatremia.  We will dose insulin, IV fluids and continue to monitor the patient's blood glucose via fingersticks.  Urinalysis shows a urinary tract infection we will dose antibiotics.  Given  the patient's history of ureterolithiasis will obtain a CT scan of the abdomen/pelvis to rule out ureterolithiasis as well.  Patient agreeable to this plan of care.  ____________________________________________   FINAL CLINICAL IMPRESSION(S) / ED DIAGNOSES  Left flank pain Hyperglycemia Urinary tract infection    Harvest Dark, MD 10/29/17 1258

## 2017-10-28 LAB — URINE CULTURE

## 2017-11-03 ENCOUNTER — Other Ambulatory Visit: Payer: Self-pay

## 2017-11-03 ENCOUNTER — Inpatient Hospital Stay
Admission: EM | Admit: 2017-11-03 | Discharge: 2017-11-04 | DRG: 638 | Disposition: A | Discharge status: Home / Self Care | Payer: Medicaid Other | Attending: Internal Medicine | Admitting: Internal Medicine

## 2017-11-03 DIAGNOSIS — F17210 Nicotine dependence, cigarettes, uncomplicated: Secondary | ICD-10-CM | POA: Diagnosis present

## 2017-11-03 DIAGNOSIS — Z7982 Long term (current) use of aspirin: Secondary | ICD-10-CM

## 2017-11-03 DIAGNOSIS — J449 Chronic obstructive pulmonary disease, unspecified: Secondary | ICD-10-CM | POA: Diagnosis present

## 2017-11-03 DIAGNOSIS — E785 Hyperlipidemia, unspecified: Secondary | ICD-10-CM | POA: Diagnosis present

## 2017-11-03 DIAGNOSIS — Z888 Allergy status to other drugs, medicaments and biological substances status: Secondary | ICD-10-CM | POA: Diagnosis not present

## 2017-11-03 DIAGNOSIS — E0810 Diabetes mellitus due to underlying condition with ketoacidosis without coma: Secondary | ICD-10-CM

## 2017-11-03 DIAGNOSIS — Z886 Allergy status to analgesic agent status: Secondary | ICD-10-CM | POA: Diagnosis not present

## 2017-11-03 DIAGNOSIS — N390 Urinary tract infection, site not specified: Secondary | ICD-10-CM | POA: Diagnosis present

## 2017-11-03 DIAGNOSIS — Z79899 Other long term (current) drug therapy: Secondary | ICD-10-CM

## 2017-11-03 DIAGNOSIS — Z794 Long term (current) use of insulin: Secondary | ICD-10-CM | POA: Diagnosis not present

## 2017-11-03 DIAGNOSIS — Z885 Allergy status to narcotic agent status: Secondary | ICD-10-CM | POA: Diagnosis not present

## 2017-11-03 DIAGNOSIS — K219 Gastro-esophageal reflux disease without esophagitis: Secondary | ICD-10-CM | POA: Diagnosis present

## 2017-11-03 DIAGNOSIS — Z9119 Patient's noncompliance with other medical treatment and regimen: Secondary | ICD-10-CM

## 2017-11-03 DIAGNOSIS — E1110 Type 2 diabetes mellitus with ketoacidosis without coma: Secondary | ICD-10-CM | POA: Diagnosis not present

## 2017-11-03 LAB — GLUCOSE, CAPILLARY
GLUCOSE-CAPILLARY: 134 mg/dL — AB (ref 65–99)
GLUCOSE-CAPILLARY: 315 mg/dL — AB (ref 65–99)
Glucose-Capillary: 389 mg/dL — ABNORMAL HIGH (ref 65–99)
Glucose-Capillary: 343 mg/dL — ABNORMAL HIGH (ref 65–99)
Glucose-Capillary: 313 mg/dL — ABNORMAL HIGH (ref 65–99)
Glucose-Capillary: 174 mg/dL — ABNORMAL HIGH (ref 65–99)
Glucose-Capillary: 260 mg/dL — ABNORMAL HIGH (ref 65–99)
Glucose-Capillary: 218 mg/dL — ABNORMAL HIGH (ref 65–99)

## 2017-11-03 LAB — BASIC METABOLIC PANEL
ANION GAP: 8 (ref 5–15)
Anion gap: 17 — ABNORMAL HIGH (ref 5–15)
BUN: 11 mg/dL (ref 6–20)
BUN: 7 mg/dL (ref 6–20)
CHLORIDE: 97 mmol/L — AB (ref 101–111)
CHLORIDE: 110 mmol/L (ref 101–111)
CO2: 17 mmol/L — AB (ref 22–32)
CO2: 21 mmol/L — AB (ref 22–32)
CREATININE: 0.64 mg/dL (ref 0.44–1.00)
Calcium: 9.9 mg/dL (ref 8.9–10.3)
Calcium: 8.8 mg/dL — ABNORMAL LOW (ref 8.9–10.3)
Creatinine, Ser: <0.3 mg/dL — ABNORMAL LOW (ref 0.44–1.00)
GFR calc Af Amer: >60 mL/min (ref >60)
GFR calc non Af Amer: >60 mL/min (ref >60)
GLUCOSE: 408 mg/dL — AB (ref 65–99)
Glucose, Bld: 135 mg/dL — ABNORMAL HIGH (ref 65–99)
POTASSIUM: 3.8 mmol/L (ref 3.5–5.1)
POTASSIUM: 3 mmol/L — AB (ref 3.5–5.1)
Sodium: 131 mmol/L — ABNORMAL LOW (ref 135–145)
Sodium: 139 mmol/L (ref 135–145)

## 2017-11-03 LAB — HEPATIC FUNCTION PANEL
ALBUMIN: 4.2 g/dL (ref 3.5–5.0)
ALT: 13 U/L — AB (ref 14–54)
AST: 17 U/L (ref 15–41)
Alkaline Phosphatase: 110 U/L (ref 38–126)
Total Bilirubin: 1.1 mg/dL (ref 0.3–1.2)
Total Protein: 8.4 g/dL — ABNORMAL HIGH (ref 6.5–8.1)

## 2017-11-03 LAB — URINALYSIS, COMPLETE (UACMP) WITH MICROSCOPIC
BACTERIA UA: NONE SEEN
Bilirubin Urine: NEGATIVE
KETONES UR: 80 mg/dL — AB
Nitrite: NEGATIVE
PH: 6 (ref 5.0–8.0)
PROTEIN: 30 mg/dL — AB
Specific Gravity, Urine: 1.025 (ref 1.005–1.030)

## 2017-11-03 LAB — LIPASE, BLOOD: Lipase: 19 U/L (ref 11–51)

## 2017-11-03 LAB — CBC
HEMATOCRIT: 48.5 % — AB (ref 35.0–47.0)
Hemoglobin: 16.6 g/dL — ABNORMAL HIGH (ref 12.0–16.0)
MCH: 33.6 pg (ref 26.0–34.0)
MCHC: 34.1 g/dL (ref 32.0–36.0)
MCV: 98.5 fL (ref 80.0–100.0)
PLATELETS: 266 10*3/uL (ref 150–440)
RBC: 4.93 MIL/uL (ref 3.80–5.20)
RDW: 13.3 % (ref 11.5–14.5)
WBC: 12.3 10*3/uL — AB (ref 3.6–11.0)

## 2017-11-03 LAB — BLOOD GAS, VENOUS
ACID-BASE DEFICIT: 9 mmol/L — AB (ref 0.0–2.0)
BICARBONATE: 16.2 mmol/L — AB (ref 20.0–28.0)
O2 SAT: 85.2 %
PCO2 VEN: 33 mmHg — AB (ref 44.0–60.0)
PH VEN: 7.3 (ref 7.250–7.430)
Patient temperature: 37
pO2, Ven: 56 mmHg — ABNORMAL HIGH (ref 32.0–45.0)

## 2017-11-03 LAB — BETA-HYDROXYBUTYRIC ACID: Beta-Hydroxybutyric Acid: 4.13 mmol/L — ABNORMAL HIGH (ref 0.05–0.27)

## 2017-11-03 MED ORDER — SODIUM CHLORIDE 0.9 % IV SOLN
Freq: Once | INTRAVENOUS | Status: AC
  Administered 2017-11-03: 07:00:00 via INTRAVENOUS

## 2017-11-03 MED ORDER — ADULT MULTIVITAMIN W/MINERALS CH
1.0000 | ORAL_TABLET | Freq: Every day | ORAL | Status: DC
  Administered 2017-11-03 – 2017-11-04 (×2): 1 via ORAL
  Filled 2017-11-03 (×2): qty 1

## 2017-11-03 MED ORDER — MORPHINE SULFATE (PF) 4 MG/ML IV SOLN
INTRAVENOUS | Status: AC
  Filled 2017-11-03: qty 1

## 2017-11-03 MED ORDER — HYDROCODONE-ACETAMINOPHEN 5-325 MG PO TABS
1.0000 | ORAL_TABLET | ORAL | Status: DC | PRN
  Administered 2017-11-03 – 2017-11-04 (×5): 1 via ORAL
  Filled 2017-11-03 (×5): qty 1

## 2017-11-03 MED ORDER — MORPHINE SULFATE (PF) 4 MG/ML IV SOLN
4.0000 mg | Freq: Once | INTRAVENOUS | Status: AC
  Administered 2017-11-03: 4 mg via INTRAVENOUS

## 2017-11-03 MED ORDER — ONDANSETRON HCL 4 MG/2ML IJ SOLN
4.0000 mg | Freq: Once | INTRAMUSCULAR | Status: AC
  Administered 2017-11-03: 4 mg via INTRAVENOUS
  Filled 2017-11-03: qty 2

## 2017-11-03 MED ORDER — SODIUM CHLORIDE 0.9 % IV SOLN
1.0000 g | Freq: Once | INTRAVENOUS | Status: AC
  Administered 2017-11-03: 1 g via INTRAVENOUS
  Filled 2017-11-03: qty 10

## 2017-11-03 MED ORDER — ACETAMINOPHEN 650 MG RE SUPP: 650.0000 mg | Freq: Four times a day (QID) | RECTAL | Status: DC | PRN

## 2017-11-03 MED ORDER — INSULIN ASPART 100 UNIT/ML ~~LOC~~ SOLN
0.0000 [IU] | Freq: Three times a day (TID) | SUBCUTANEOUS | Status: DC
  Administered 2017-11-03 – 2017-11-04 (×2): 7 [IU] via SUBCUTANEOUS
  Filled 2017-11-03 (×3): qty 1

## 2017-11-03 MED ORDER — ENOXAPARIN SODIUM 40 MG/0.4ML ~~LOC~~ SOLN
40.0000 mg | SUBCUTANEOUS | Status: DC
  Administered 2017-11-03: 40 mg via SUBCUTANEOUS
  Filled 2017-11-03: qty 0.4

## 2017-11-03 MED ORDER — CITALOPRAM HYDROBROMIDE 20 MG PO TABS
20.0000 mg | ORAL_TABLET | Freq: Every day | ORAL | Status: DC
  Administered 2017-11-04: 20 mg via ORAL
  Filled 2017-11-03: qty 1

## 2017-11-03 MED ORDER — LORAZEPAM 0.5 MG PO TABS
0.5000 mg | ORAL_TABLET | Freq: Three times a day (TID) | ORAL | Status: DC | PRN
  Administered 2017-11-03 – 2017-11-04 (×3): 0.5 mg via ORAL
  Filled 2017-11-03 (×3): qty 1

## 2017-11-03 MED ORDER — SODIUM CHLORIDE 0.9 % IV SOLN
Freq: Once | INTRAVENOUS | Status: AC
  Administered 2017-11-03: 09:00:00 via INTRAVENOUS

## 2017-11-03 MED ORDER — SODIUM CHLORIDE 0.9 % IV SOLN
INTRAVENOUS | Status: DC
  Administered 2017-11-03 (×2): via INTRAVENOUS

## 2017-11-03 MED ORDER — SENNOSIDES-DOCUSATE SODIUM 8.6-50 MG PO TABS: 1.0000 | ORAL_TABLET | Freq: Every evening | ORAL | Status: DC | PRN

## 2017-11-03 MED ORDER — ONDANSETRON HCL 4 MG PO TABS: 4.0000 mg | ORAL_TABLET | Freq: Four times a day (QID) | ORAL | Status: DC | PRN

## 2017-11-03 MED ORDER — INSULIN DETEMIR 100 UNIT/ML ~~LOC~~ SOLN
20.0000 [IU] | Freq: Two times a day (BID) | SUBCUTANEOUS | Status: DC
  Administered 2017-11-03 (×2): 20 [IU] via SUBCUTANEOUS
  Filled 2017-11-03 (×7): qty 0.2

## 2017-11-03 MED ORDER — INSULIN ASPART 100 UNIT/ML ~~LOC~~ SOLN
0.0000 [IU] | Freq: Three times a day (TID) | SUBCUTANEOUS | Status: DC
  Administered 2017-11-03: 3 [IU] via SUBCUTANEOUS
  Filled 2017-11-03: qty 1

## 2017-11-03 MED ORDER — CEPHALEXIN 500 MG PO CAPS
500.0000 mg | ORAL_CAPSULE | Freq: Two times a day (BID) | ORAL | Status: DC
  Administered 2017-11-04: 500 mg via ORAL
  Filled 2017-11-03: qty 1

## 2017-11-03 MED ORDER — INSULIN ASPART 100 UNIT/ML ~~LOC~~ SOLN
10.0000 [IU] | Freq: Once | SUBCUTANEOUS | Status: AC
  Administered 2017-11-03: 10 [IU] via SUBCUTANEOUS
  Filled 2017-11-03: qty 1

## 2017-11-03 MED ORDER — LORAZEPAM 2 MG/ML IJ SOLN
1.0000 mg | Freq: Once | INTRAMUSCULAR | Status: AC
  Administered 2017-11-03: 1 mg via INTRAVENOUS
  Filled 2017-11-03: qty 1

## 2017-11-03 MED ORDER — ACETAMINOPHEN 325 MG PO TABS: 650.0000 mg | ORAL_TABLET | Freq: Four times a day (QID) | ORAL | Status: DC | PRN

## 2017-11-03 MED ORDER — ONDANSETRON HCL 4 MG/2ML IJ SOLN: 4.0000 mg | Freq: Four times a day (QID) | INTRAMUSCULAR | Status: DC | PRN

## 2017-11-03 MED ORDER — PREMIER PROTEIN SHAKE
11.0000 [oz_av] | Freq: Two times a day (BID) | ORAL | Status: DC
  Administered 2017-11-03 – 2017-11-04 (×2): 11 [oz_av] via ORAL

## 2017-11-03 NOTE — Progress Notes (Signed)
Initial Nutrition Assessment  DOCUMENTATION CODES:   Not applicable  INTERVENTION:   Premier Protein BID when diet advanced, each supplement provides 160 kcal and 30 grams of protein.   MVI daily   NUTRITION DIAGNOSIS:   Inadequate oral intake related to acute illness as evidenced by NPO status.  GOAL:   Patient will meet greater than or equal to 90% of their needs  MONITOR:   PO intake, Supplement acceptance, Labs, Weight trends, Skin, I & O's  REASON FOR ASSESSMENT:   Malnutrition Screening Tool    ASSESSMENT:   52 y.o. female with a history of diabetes, COPD, GERD, pancreatitis who presents to the ED for hyperglycemia.    Met with pt in room today. RD familiar with this pt from previous admits. Pt reports good appetite and oral intake pta. Pt eats only one meal a day at baseline. RD provided this pt with diabetes education during her last admit in April. Pt reports today that she has stopped buying junk foods high in sugar, switched over to wheat breads and has started walking. Pt with multiple weight fluctuations pta; reports her UBW is around 130lbs. Bed scale today weighed pt ~147lbs; pt is with generalized edema today. Unsure if pt has had any true weight loss. Pt would like to have chocolate Premier Protein when diet advanced. Pt is generally a good eater while hospitalized.   Medications reviewed and include: cephalexin, lovenox, insulin, NaCl _0 /hr  Labs reviewed: Na 131(L), Cl 97(L) Wbc- 12.3(H) cbgs- 389, 343, 313, 174 x 24 hrs AIC 15.8(H)- 05/2017  NUTRITION - FOCUSED PHYSICAL EXAM:    Most Recent Value  Orbital Region  No depletion  Upper Arm Region  Mild depletion  Thoracic and Lumbar Region  Mild depletion  Buccal Region  No depletion  Temple Region  No depletion  Clavicle Bone Region  Mild depletion  Clavicle and Acromion Bone Region  Mild depletion  Scapular Bone Region  Mild depletion  Dorsal Hand  No depletion  Patellar Region  Moderate  depletion  Anterior Thigh Region  Moderate depletion  Posterior Calf Region  Moderate depletion  Edema (RD Assessment)  Mild  Hair  Reviewed  Eyes  Reviewed  Mouth  Reviewed  Skin  Reviewed  Nails  Reviewed     Diet Order:   Diet Order           Diet NPO time specified Except for: Sips with Meds  Diet effective now         EDUCATION NEEDS:   Education needs have been addressed  Skin:  Skin Assessment: Reviewed RN Assessment  Last BM:  5/15  Height:   Ht Readings from Last 1 Encounters:  11/03/17 _1  (1.549 m)    Weight:   Wt Readings from Last 1 Encounters:  11/03/17 147 lb 9.6 oz (67 kg)    Ideal Body Weight:  47.7 kg  BMI:  Body mass index is 27.89 kg/m.  Estimated Nutritional Needs:   Kcal:  1500-1700kcal/day   Protein:  71-83g/day   Fluid:  >1.2L/day   Koleen Distance MS, RD, LDN Pager #- [phone] Office#- [phone] After Hours Pager: [phone]

## 2017-11-03 NOTE — H&P (Signed)
[facility] [place] NAME: Renee Mack    MR#:  [id number]  DATE OF BIRTH:  [birth year]  DATE OF ADMISSION:  11/03/2017  PRIMARY CARE PHYSICIAN: Ro, [name], MD   REQUESTING/REFERRING PHYSICIAN: Dr. Jimmye Norman  CHIEF COMPLAINT:   Abdominal pain and hyperglycemia HISTORY OF PRESENT ILLNESS:  Renee Mack  is a 52 y.o. female with a known history of type II diabetes on insulin, hyperlipidemia, ongoing tobacco abuse to the emergency room with vomiting and abdominal pain. She checked her sugar it was about 400. She took 33 units of Levemir at 4 AM. She could not keep anything down came to the emergency room found to be in mild DKA.  Received 2 L of IV fluid bolus. Received some insulin and being admitted for further evaluation of management. Denies any fever or shortness of breath  PAST MEDICAL HISTORY:   Past Medical History:  Diagnosis Date  . Cancer ([place])    vulvular  . COPD (chronic obstructive pulmonary disease) ([place])   . Diabetes mellitus without complication ([place])   . GERD (gastroesophageal reflux disease)   . HLD (hyperlipidemia)   . Pancreatitis   . Shingles     PAST SURGICAL HISTOIRY:   Past Surgical History:  Procedure Laterality Date  . ABDOMINAL HYSTERECTOMY     partial  . ECTOPIC PREGNANCY SURGERY Left   . LYMPHADENECTOMY    . SPLENECTOMY, PARTIAL    . vulvulasectomy      SOCIAL HISTORY:   Social History   Tobacco Use  . Smoking status: Current Every Day Smoker    Packs/day: 0.50    Years: 30.00    Pack years: 15.00    Types: Cigarettes  . Smokeless tobacco: Never Used  Substance Use Topics  . Alcohol use: No    FAMILY HISTORY:   Family History  Problem Relation Age of Onset  . Cancer Mother   . Osteoporosis Sister   . Heart disease Brother   . Heart attack Father     DRUG ALLERGIES:   Allergies  Allergen Reactions  . Metformin And Related Shortness Of Breath  . Darvon  [Propoxyphene] Itching  . Gabapentin Swelling  . Nsaids Other (See Comments)    Ulcers   . Tramadol Hives    REVIEW OF SYSTEMS:  Review of Systems  Constitutional: Negative for chills, fever and weight loss.  HENT: Negative for ear discharge, ear pain and nosebleeds.   Eyes: Negative for blurred vision, pain and discharge.  Respiratory: Negative for sputum production, shortness of breath, wheezing and stridor.   Cardiovascular: Negative for chest pain, palpitations, orthopnea and PND.  Gastrointestinal: Positive for abdominal pain and nausea. Negative for diarrhea and vomiting.  Genitourinary: Negative for frequency and urgency.  Musculoskeletal: Negative for back pain and joint pain.  Neurological: Negative for sensory change, speech change, focal weakness and weakness.  Psychiatric/Behavioral: Negative for depression and hallucinations. The patient is not nervous/anxious.      MEDICATIONS AT HOME:   Prior to Admission medications   Medication Sig Start Date End Date Taking? Authorizing Provider  acetaminophen (TYLENOL) 325 MG tablet Take 650 mg by mouth every 6 (six) hours as needed for mild pain.    Yes [provider]  albuterol (PROAIR HFA) 108 (90 Base) MCG/ACT inhaler Inhale 1 puff into the lungs every 6 (six) hours as needed for wheezing or shortness of breath.    Yes [provider]  aspirin 81 MG tablet  Take 81 mg by mouth daily.   Yes [provider]  cephALEXin (KEFLEX) 500 MG capsule Take 1 capsule (500 mg total) by mouth 3 (three) times daily. 10/27/17  Yes Paduchowski, Lennette Bihari, MD  citalopram (CELEXA) 20 MG tablet Take 20 mg daily by mouth.   Yes [provider]  HYDROcodone-acetaminophen (NORCO/VICODIN) 5-325 MG tablet Take 1 tablet by mouth every 4 (four) hours as needed. 10/27/17  Yes Harvest Dark, MD  insulin detemir (LEVEMIR) 100 UNIT/ML injection Take 33 units subcutaneously in the morning and 23 units at bedtime 09/29/17  Yes  Mody, Sital, MD  insulin lispro (HUMALOG) 100 UNIT/ML injection Take per sliding scale with meals:  For blood sugars  <150- no coverage 151-200- 2 units 201-250- 4 units 251-300- 6 units 301-350- 8 units and >351 call MD Patient taking differently: Inject 0-8 Units into the skin 3 (three) times daily with meals. Take per sliding scale with meals:  For blood sugars  <150- no coverage 151-200- 2 units 201-250- 4 units 251-300- 6 units 301-350- 8 units and >351 call MD 07/20/17 07/20/18 Yes Gladstone Lighter, MD  LORazepam (ATIVAN) 0.5 MG tablet Take 1 tablet (0.5 mg total) by mouth every 8 (eight) hours as needed for anxiety. 02/06/17 02/06/18 Yes Lisa Roca, MD  sucralfate (CARAFATE) 1 g tablet Take 1 tablet (1 g total) by mouth 4 (four) times daily. 08/01/17 08/01/18 Yes Earleen Newport, MD  esomeprazole (NEXIUM) 40 MG capsule Take 40 mg by mouth daily at 12 noon.    [provider]  ondansetron (ZOFRAN ODT) 4 MG disintegrating tablet Take 1 tablet (4 mg total) by mouth every 8 (eight) hours as needed for nausea or vomiting. 09/01/17   Alfred Levins, [place], MD  phenazopyridine (PYRIDIUM) 200 MG tablet Take 1 tablet (200 mg total) by mouth 3 (three) times daily as needed for pain. 10/27/17 10/27/18  Harvest Dark, MD      VITAL SIGNS:  Blood pressure 101/61, pulse 67, temperature 97.6 F (36.4 C), temperature source Oral, resp. rate 16, height 5\' 1"  (1.549 m), weight 67 kg (147 lb 9.6 oz), SpO2 100 %.  PHYSICAL EXAMINATION:  GENERAL:  52 y.o.-year-old patient lying in the bed with no acute distress.  EYES: Pupils equal, round, reactive to light and accommodation. No scleral icterus. Extraocular muscles intact.  HEENT: Head atraumatic, normocephalic. Oropharynx and nasopharynx clear.  NECK:  Supple, no jugular venous distention. No thyroid enlargement, no tenderness.  LUNGS: Normal breath sounds bilaterally, no wheezing, rales,rhonchi or crepitation. No use of accessory  muscles of respiration.  CARDIOVASCULAR: S1, S2 normal. No murmurs, rubs, or gallops.  ABDOMEN: Soft, nontender, nondistended. Bowel sounds present. No organomegaly or mass.  EXTREMITIES: No pedal edema, cyanosis, or clubbing.  NEUROLOGIC: Cranial nerves II through XII are intact. Muscle strength 5/5 in all extremities. Sensation intact. Gait not checked.  PSYCHIATRIC: The patient is alert and oriented x 3.  SKIN: No obvious rash, lesion, or ulcer.   LABORATORY PANEL:   CBC Recent Labs  Lab 11/03/17 0600  WBC 12.3*  HGB 16.6*  HCT 48.5*  PLT 266   ------------------------------------------------------------------------------------------------------------------  Chemistries  Recent Labs  Lab 11/03/17 0600 11/03/17 1251  NA 131* 139  K 3.8 3.0*  CL 97* 110  CO2 17* 21*  GLUCOSE 408* 135*  BUN 11 7  CREATININE 0.64 <0.30*  CALCIUM 9.9 8.8*  AST 17  --   ALT 13*  --   ALKPHOS 110  --   BILITOT 1.1  --    ------------------------------------------------------------------------------------------------------------------  Cardiac Enzymes No results for input(s): TROPONINI in the last 168 hours. ------------------------------------------------------------------------------------------------------------------  RADIOLOGY:  No results found.  EKG:    IMPRESSION AND PLAN:   Renee Mack  is a 52 y.o. female with a known history of type II diabetes on insulin, hyperlipidemia, ongoing tobacco abuse to the emergency room with vomiting and abdominal pain. She checked her sugar it was about 400. She took 33 units of Levemir at 4 AM. She could not keep anything down came to the emergency room found to be in mild DKA.  1. mild DKA -patient presented with nausea vomiting abdominal pain -anion gap of 17. Sugars were in the 400 -admit to medical floor. Continue IV fluids. -Give her subcu insulin and change to home does insulin once her anion gap closes -patient reports being  noncompliant to her insulin intermittently. She has a lot of stressors at home. -sHe does have insulin supply  2. abdominal pain suspected due to DKA and vomiting -CT abdomen for renal study negative. -Urine culture from ER visit two days ago shows multiple species. She is on Keflex since then will complete the course.  3. Tobacco abuse counsel for more than four minutes smoking cessation  4. Hyperlipidemia continue home meds   All the records are reviewed and case discussed with ED provider. Management plans discussed with the patient, family and they are in agreement.  CODE STATUS: full  TOTAL TIME TAKING CARE OF THIS PATIENT: *45* minutes.    Fritzi Mandes M.D on 11/03/2017 at 3:06 PM  Between 7am to 6pm - Pager - [phone]  After 6pm go to [email or web address] - password [email or web address] [facility]  [facility]  Office  [phone]  CC: Primary care physician; Carolyn Stare [name], MD

## 2017-11-03 NOTE — ED Provider Notes (Signed)
[facility] Emergency Department Provider Note       Time seen: ----------------------------------------- 7:01 AM on 11/03/2017 -----------------------------------------   I have reviewed the triage vital signs and the nursing notes.  HISTORY   Chief Complaint Abdominal Pain and Hyperglycemia    HPI Renee Mack is a 52 y.o. female with a history of diabetes, COPD, GERD, pancreatitis who presents to the ED for hyperglycemia.  Patient reports vomiting and abdominal pain starting around 1 AM.  She checked her blood sugar and it was over 400.  She took 33 units of Levemir 4 AM but it did not bring her sugar down and she cannot vomiting.  She reports she been in DKA in the past and this feels similarly.  She denies fever but has had some chills, denies diarrhea.  She reports 10-12 episodes of vomiting over the last 24 hours.  Past Medical History:  Diagnosis Date  . Cancer ([place])    vulvular  . COPD (chronic obstructive pulmonary disease) ([place])   . Diabetes mellitus without complication ([place])   . GERD (gastroesophageal reflux disease)   . HLD (hyperlipidemia)   . Pancreatitis   . Shingles     Patient Active Problem List   Diagnosis Date Noted  . Protein-calorie malnutrition, severe 07/18/2017  . DKA (diabetic ketoacidoses) ([place]) 07/17/2017  . Acute ischemic enteritis ([place]) 06/12/2017  . Pyelonephritis 12/28/2014  . Type 2 diabetes mellitus ([place]) 12/28/2014  . COPD (chronic obstructive pulmonary disease) ([place]) 12/28/2014  . GERD (gastroesophageal reflux disease) 12/28/2014  . History of shingles 12/28/2014  . HLD (hyperlipidemia) 12/28/2014    Past Surgical History:  Procedure Laterality Date  . ABDOMINAL HYSTERECTOMY     partial  . ECTOPIC PREGNANCY SURGERY Left   . LYMPHADENECTOMY    . SPLENECTOMY, PARTIAL    . vulvulasectomy      Allergies Metformin and related; Darvon [propoxyphene]; Gabapentin; Nsaids; and Tramadol  Social  History Social History   Tobacco Use  . Smoking status: Current Every Day Smoker    Packs/day: 0.50    Years: 30.00    Pack years: 15.00    Types: Cigarettes  . Smokeless tobacco: Never Used  Substance Use Topics  . Alcohol use: No  . Drug use: No   Review of Systems Constitutional: Negative for fever. Cardiovascular: Negative for chest pain. Respiratory: Negative for shortness of breath. Gastrointestinal: Positive for abdominal pain, vomiting Musculoskeletal: Negative for back pain. Skin: Negative for rash. Neurological: Negative for headaches, focal weakness or numbness.  All systems negative/normal/unremarkable except as stated in the HPI  ____________________________________________   PHYSICAL EXAM:  VITAL SIGNS: ED Triage Vitals  Enc Vitals Group     BP 11/03/17 0556 115/74     Pulse Rate 11/03/17 0556 83     Resp 11/03/17 0556 18     Temp 11/03/17 0556 98.1 F (36.7 C)     Temp Source 11/03/17 0556 Oral     SpO2 11/03/17 0556 98 %     Weight 11/03/17 0557 130 lb (59 kg)     Height 11/03/17 0557 5\' 1"  (1.549 m)     Head Circumference --      Peak Flow --      Pain Score 11/03/17 0557 8     Pain Loc --      Pain Edu? --      Excl. in [place]? --    Constitutional: Alert and oriented.  Anxious, no distress Eyes: Conjunctivae are normal. Normal extraocular movements.  ENT   Head: Normocephalic and atraumatic.   Nose: No congestion/rhinnorhea.   Mouth/Throat: Mucous membranes are moist.   Neck: No stridor. Cardiovascular: Normal rate, regular rhythm. No murmurs, rubs, or gallops. Respiratory: Normal respiratory effort without tachypnea nor retractions. Breath sounds are clear and equal bilaterally. No wheezes/rales/rhonchi. Gastrointestinal: Soft and nontender. Normal bowel sounds Musculoskeletal: Nontender with normal range of motion in extremities. No lower extremity tenderness nor edema. Neurologic:  Normal speech and language. No gross focal  neurologic deficits are appreciated.  Skin:  Skin is warm, dry and intact. No rash noted. Psychiatric: Anxious mood and affect ___________________________________________  ED COURSE:  As part of my medical decision making, I reviewed the following data within the [place] History obtained from family if available, nursing notes, old chart and ekg, as well as notes from prior ED visits. Patient presented for vomiting and hyperglycemia, we will assess with labs as indicated at this time. Clinical Course as of Nov 04 850  Thu Nov 03, 2017  6314 Beta-Hydroxybutyric Acid(!): 4.13 [JW]    Clinical Course User Index [JW] Earleen Newport, MD   Procedures ____________________________________________   LABS (pertinent positives/negatives)  Labs Reviewed  GLUCOSE, CAPILLARY - Abnormal; Notable for the following components:      Result Value   Glucose-Capillary 389 (*)    All other components within normal limits  BASIC METABOLIC PANEL - Abnormal; Notable for the following components:   Sodium 131 (*)    Chloride 97 (*)    CO2 17 (*)    Glucose, Bld 408 (*)    Anion gap 17 (*)    All other components within normal limits  CBC - Abnormal; Notable for the following components:   WBC 12.3 (*)    Hemoglobin 16.6 (*)    HCT 48.5 (*)    All other components within normal limits  URINALYSIS, COMPLETE (UACMP) WITH MICROSCOPIC - Abnormal; Notable for the following components:   Color, Urine STRAW (*)    APPearance CLEAR (*)    Glucose, UA >=500 (*)    Hgb urine dipstick SMALL (*)    Ketones, ur 80 (*)    Protein, ur 30 (*)    Leukocytes, UA TRACE (*)    All other components within normal limits  HEPATIC FUNCTION PANEL - Abnormal; Notable for the following components:   Total Protein 8.4 (*)    ALT 13 (*)    Bilirubin, Direct <0.1 (*)    All other components within normal limits  BLOOD GAS, VENOUS - Abnormal; Notable for the following components:   pCO2, Ven 33  (*)    pO2, Ven 56.0 (*)    Bicarbonate 16.2 (*)    Acid-base deficit 9.0 (*)    All other components within normal limits  BETA-HYDROXYBUTYRIC ACID - Abnormal; Notable for the following components:   Beta-Hydroxybutyric Acid 4.13 (*)    All other components within normal limits  LIPASE, BLOOD  GLUCOSE, CAPILLARY  CBG MONITORING, ED   ____________________________________________  DIFFERENTIAL DIAGNOSIS   Hyperglycemia, dehydration, electrolyte abnormality, anxiety, DKA  FINAL ASSESSMENT AND PLAN  DKA   Plan: The patient had presented for vomiting with home blood sugar elevation. Patient's labs do indicate mild diabetic ketoacidosis.  She was started on fluids here and will receive additional subcutaneous insulin.  I do not think she needs a continuous insulin drip.  She has required some pain medicine and antiemetics.  I will discuss with the hospitalist for admission.   Johnathan  Alain Honey, MD   Note: This note was generated in part or whole with voice recognition software. Voice recognition is usually quite accurate but there are transcription errors that can and very often do occur. I apologize for any typographical errors that were not detected and corrected.     Earleen Newport, MD 11/03/17 [phone]

## 2017-11-03 NOTE — Care Management (Addendum)
Patient has no insurance. Previous referral to [facility] and [facility]. RNCM received notification regarding patient frequent presentation related to uncontrolled DM and abdominal pain.  I met again with patient. She did apply for Medicaid after our last discussion in January however she was set up with [employer] for some reason- this will not cover her DM need and does not anticipate any needs for family planning.  She states she lives alone. She states she walks independently and drives. She states she did follow up with [facility] but states she has turned everything in to [facility] without an appointment. I have reached out to both offices to check status.  I asked patient if the two telephone numbers were accurate and her response was "those do not work but she cannot recall her new mobile number".  I explained that [facility] would need that and the importance of her following through with her responsibilities and to take care of herself. She agreed. I spoke with [facility] clinic listed (Dr. Carolyn Stare) and she was last seen by them in January but has many no show no calls. They confirm that she did have charity through them in 2016 but that has expired.  She states she "checks her blood sugar 10 times per day". She says that's what she was told to do.  She confirms that she has a working glucometer and that she uses her insulin like she's supposed to. She doesn't recall having any pills to take for her blood sugars.  She says [facility] fills her insulin.  I have sent an email to both [facility] and [facility] to check her status. Update at 1138AM: Alm Bustard with medication management said that that patient never came to their pharmacy for assistance.

## 2017-11-03 NOTE — ED Triage Notes (Signed)
Pt arrives to ED via POV from home with c/o right and left lower abdominal pain and HYPERglycemia. Pt reports pain stated 4 hrs PTA, pt reports CBG at home was 426 at 530am. Pt states 10-12 episodes of emesis over the last 24 hrs.

## 2017-11-03 NOTE — Progress Notes (Signed)
Inpatient Diabetes Program Recommendations  AACE/ADA: New Consensus Statement on Inpatient Glycemic Control (2015)  Target Ranges:  Prepandial:   less than 140 mg/dL      Peak postprandial:   less than 180 mg/dL (1-2 hours)      Critically ill patients:  140 - 180 mg/dL   Lab Results  Component Value Date   GLUCAP 174 (H) 11/03/2017   HGBA1C 15.8 (H) 06/12/2017    Review of Glycemic ControlResults for DAENA, ALPER (MRN [id number]) as of 11/03/2017 13:54  Ref. Range 11/03/2017 05:57 11/03/2017 08:33 11/03/2017 09:39 11/03/2017 11:43  Glucose-Capillary Latest Ref Range: 65 - 99 mg/dL 389 (H) 343 (H) 313 (H) 174 (H)    Diabetes history: Type 2 DM  Outpatient Diabetes medications: Levemir 33 units AM and 23 units PM- Novolog 0-8 units tid with meals Current orders for Inpatient glycemic control:  Novolog moderate tid with meals   Inpatient Diabetes Program Recommendations:    Please restart a portion of patient's home dose of Levemir.  Consider Levemir 20 units bid.  Text page sent to MD.  Also consider checking A1C.    Thanks,  Adah Perl, RN, BC-ADM Inpatient Diabetes Coordinator Pager [phone] (8a-5p)

## 2017-11-03 NOTE — ED Notes (Signed)
vomiting and stomach ache starting around 1am and she checked her blood sugar and it was over 400. She took her 33u of Levomir at 4am but it didn't bring her sugar down and she has kept vomiting. Normally takes her insulin around 7am

## 2017-11-04 LAB — GLUCOSE, CAPILLARY
GLUCOSE-CAPILLARY: 318 mg/dL — AB (ref 65–99)
GLUCOSE-CAPILLARY: 294 mg/dL — AB (ref 65–99)
Glucose-Capillary: 212 mg/dL — ABNORMAL HIGH (ref 65–99)
Glucose-Capillary: 353 mg/dL — ABNORMAL HIGH (ref 65–99)

## 2017-11-04 LAB — BASIC METABOLIC PANEL
Anion gap: 8 (ref 5–15)
BUN: 11 mg/dL (ref 6–20)
CALCIUM: 8.7 mg/dL — AB (ref 8.9–10.3)
CO2: 22 mmol/L (ref 22–32)
CREATININE: 0.33 mg/dL — AB (ref 0.44–1.00)
Chloride: 105 mmol/L (ref 101–111)
GFR calc Af Amer: >60 mL/min (ref >60)
GFR calc non Af Amer: >60 mL/min (ref >60)
GLUCOSE: 219 mg/dL — AB (ref 65–99)
Potassium: 3.3 mmol/L — ABNORMAL LOW (ref 3.5–5.1)
Sodium: 135 mmol/L (ref 135–145)

## 2017-11-04 LAB — URINE CULTURE: CULTURE: NO GROWTH

## 2017-11-04 MED ORDER — INSULIN LISPRO 100 UNIT/ML ~~LOC~~ SOLN: SUBCUTANEOUS | 4 refills | Status: DC

## 2017-11-04 MED ORDER — INSULIN ASPART 100 UNIT/ML ~~LOC~~ SOLN
20.0000 [IU] | Freq: Once | SUBCUTANEOUS | Status: AC
  Administered 2017-11-04: 20 [IU] via SUBCUTANEOUS
  Filled 2017-11-04: qty 1

## 2017-11-04 MED ORDER — INSULIN DETEMIR 100 UNIT/ML ~~LOC~~ SOLN: SUBCUTANEOUS | 0 refills | Status: DC

## 2017-11-04 MED ORDER — PREMIER PROTEIN SHAKE: 11.0000 [oz_av] | Freq: Two times a day (BID) | ORAL | 0 refills | Status: DC

## 2017-11-04 MED ORDER — INSULIN DETEMIR 100 UNIT/ML ~~LOC~~ SOLN
24.0000 [IU] | Freq: Two times a day (BID) | SUBCUTANEOUS | Status: DC
  Administered 2017-11-04: 24 [IU] via SUBCUTANEOUS
  Filled 2017-11-04 (×3): qty 0.24

## 2017-11-04 NOTE — Care Management (Signed)
RNCM requested prescriptions if patient is to discharge today, so they can be sent to medication management

## 2017-11-04 NOTE — Discharge Summary (Signed)
[facility] NAME: Renee Mack    MR#:  [id number]  DATE OF BIRTH:  1965-12-01  DATE OF ADMISSION:  11/03/2017 ADMITTING PHYSICIAN: Fritzi Mandes, MD  DATE OF DISCHARGE: 11/04/2017  PRIMARY CARE PHYSICIAN: Ro, [name], MD    ADMISSION DIAGNOSIS:  Lower urinary tract infectious disease [N39.0] Diabetic ketoacidosis without coma associated with diabetes mellitus due to underlying condition ([place]) [E08.10]  DISCHARGE DIAGNOSIS:  DKA--mild, resolved  SECONDARY DIAGNOSIS:   Past Medical History:  Diagnosis Date  . Cancer ([place])    vulvular  . COPD (chronic obstructive pulmonary disease) ([place])   . Diabetes mellitus without complication ([place])   . GERD (gastroesophageal reflux disease)   . HLD (hyperlipidemia)   . Pancreatitis   . Shingles     HOSPITAL COURSE:  Renee Mack  is a 52 y.o. female with a known history of type II diabetes on insulin, hyperlipidemia, ongoing tobacco abuse to the emergency room with vomiting and abdominal pain. She checked her sugar it was about 400. She took 33 units of Levemir at 4 AM. She could not keep anything down came to the emergency room found to be in mild DKA.  1. mild DKA -patient presented with nausea vomiting abdominal pain -anion gap of 17. Sugars were in the 400---anion gap close. Sugars better. -Will change her to level meter 35 units in the morning 20 units in the evening and continue log with sliding scale. Patient advised importance of diet education and follow-up with primary care. Diabetes coordinator input noted.  2. abdominal pain suspected due to DKA and vomiting -CT abdomen for renal study negative. -Urine culture from ER visit two days ago shows multiple species. She is on Keflex since then will complete the course. -Tolerating diet. No complaints of abdominal pain today.  3. Tobacco abuse counsel for more than four minutes smoking cessation  4. Hyperlipidemia  continue home meds  Patient will discharged to home with outpatient follow-up with PCP  CONSULTS OBTAINED:    DRUG ALLERGIES:   Allergies  Allergen Reactions  . Metformin And Related Shortness Of Breath  . Darvon [Propoxyphene] Itching  . Gabapentin Swelling  . Nsaids Other (See Comments)    Ulcers   . Tramadol Hives    DISCHARGE MEDICATIONS:   Allergies as of 11/04/2017      Reactions   Metformin And Related Shortness Of Breath   Darvon [propoxyphene] Itching   Gabapentin Swelling   Nsaids Other (See Comments)   Ulcers   Tramadol Hives      Medication List    STOP taking these medications   phenazopyridine 200 MG tablet Commonly known as:  PYRIDIUM     TAKE these medications   acetaminophen 325 MG tablet Commonly known as:  TYLENOL Take 650 mg by mouth every 6 (six) hours as needed for mild pain.   aspirin 81 MG tablet Take 81 mg by mouth daily.   cephALEXin 500 MG capsule Commonly known as:  KEFLEX Take 1 capsule (500 mg total) by mouth 3 (three) times daily.   citalopram 20 MG tablet Commonly known as:  CELEXA Take 20 mg daily by mouth.   esomeprazole 40 MG capsule Commonly known as:  NEXIUM Take 40 mg by mouth daily at 12 noon.   HYDROcodone-acetaminophen 5-325 MG tablet Commonly known as:  NORCO/VICODIN Take 1 tablet by mouth every 4 (four) hours as needed.   insulin detemir 100 UNIT/ML injection Commonly known as:  LEVEMIR Take 33 units subcutaneously in the morning and 23 units at bedtime   insulin lispro 100 UNIT/ML injection Commonly known as:  HUMALOG Take per sliding scale with meals:  For blood sugars  <150- no coverage 151-200- 2 units 201-250- 4 units 251-300- 6 units 301-350- 8 units and >351 call MD What changed:    how much to take  how to take this  when to take this  additional instructions   LORazepam 0.5 MG tablet Commonly known as:  ATIVAN Take 1 tablet (0.5 mg total) by mouth every 8 (eight) hours as needed  for anxiety.   ondansetron 4 MG disintegrating tablet Commonly known as:  ZOFRAN ODT Take 1 tablet (4 mg total) by mouth every 8 (eight) hours as needed for nausea or vomiting.   PROAIR HFA 108 (90 Base) MCG/ACT inhaler Generic drug:  albuterol Inhale 1 puff into the lungs every 6 (six) hours as needed for wheezing or shortness of breath.   protein supplement shake Liqd Commonly known as:  PREMIER PROTEIN Take 325 mLs (11 oz total) by mouth 2 (two) times daily between meals.   sucralfate 1 g tablet Commonly known as:  CARAFATE Take 1 tablet (1 g total) by mouth 4 (four) times daily.       If you experience worsening of your admission symptoms, develop shortness of breath, life threatening emergency, suicidal or homicidal thoughts you must seek medical attention immediately by calling 911 or calling your MD immediately  if symptoms less severe.  You Must read complete instructions/literature along with all the possible adverse reactions/side effects for all the Medicines you take and that have been prescribed to you. Take any new Medicines after you have completely understood and accept all the possible adverse reactions/side effects.   Please note  You were cared for by a hospitalist during your hospital stay. If you have any questions about your discharge medications or the care you received while you were in the hospital after you are discharged, you can call the unit and asked to speak with the hospitalist on call if the hospitalist that took care of you is not available. Once you are discharged, your primary care physician will handle any further medical issues. Please note that NO REFILLS for any discharge medications will be authorized once you are discharged, as it is imperative that you return to your primary care physician (or establish a relationship with a primary care physician if you do not have one) for your aftercare needs so that they can reassess your need for medications  and monitor your lab values. Today   SUBJECTIVE   Feels a whole lot better. "I'm ready go home"  VITAL SIGNS:  Blood pressure 137/78, pulse 81, temperature 98.3 F (36.8 C), temperature source Oral, resp. rate 16, height 5\' 1"  (1.549 m), weight 67 kg (147 lb 9.6 oz), SpO2 96 %.  I/O:    Intake/Output Summary (Last 24 hours) at 11/04/2017 1412 Last data filed at 11/04/2017 1356 Gross per 24 hour  Intake 2818 ml  Output 1800 ml  Net 1018 ml    PHYSICAL EXAMINATION:  GENERAL:  52 y.o.-year-old patient lying in the bed with no acute distress.  EYES: Pupils equal, round, reactive to light and accommodation. No scleral icterus. Extraocular muscles intact.  HEENT: Head atraumatic, normocephalic. Oropharynx and nasopharynx clear.  NECK:  Supple, no jugular venous distention. No thyroid enlargement, no tenderness.  LUNGS: Normal breath sounds bilaterally, no wheezing, rales,rhonchi or crepitation. No use  of accessory muscles of respiration.  CARDIOVASCULAR: S1, S2 normal. No murmurs, rubs, or gallops.  ABDOMEN: Soft, non-tender, non-distended. Bowel sounds present. No organomegaly or mass.  EXTREMITIES: No pedal edema, cyanosis, or clubbing.  NEUROLOGIC: Cranial nerves II through XII are intact. Muscle strength 5/5 in all extremities. Sensation intact. Gait not checked.  PSYCHIATRIC: The patient is alert and oriented x 3.  SKIN: No obvious rash, lesion, or ulcer.   DATA REVIEW:   CBC  Recent Labs  Lab 11/03/17 0600  WBC 12.3*  HGB 16.6*  HCT 48.5*  PLT 266    Chemistries  Recent Labs  Lab 11/03/17 0600  11/04/17 0652  NA 131*   < > 135  K 3.8   < > 3.3*  CL 97*   < > 105  CO2 17*   < > 22  GLUCOSE 408*   < > 219*  BUN 11   < > 11  CREATININE 0.64   < > 0.33*  CALCIUM 9.9   < > 8.7*  AST 17  --   --   ALT 13*  --   --   ALKPHOS 110  --   --   BILITOT 1.1  --   --    < > = values in this interval not displayed.    Microbiology Results   Recent Results (from  the past 240 hour(s))  Urine Culture     Status: Abnormal   Collection Time: 10/27/17  1:08 AM  Result Value Ref Range Status   Specimen Description   Final    URINE, RANDOM Performed at [facility], [address] [place], [place] [address]    Special Requests   Final    NONE Performed at [facility], [address] [place], [place] [address]    Culture MULTIPLE SPECIES PRESENT, SUGGEST RECOLLECTION (A)  Final   Report Status 10/28/2017 FINAL  Final  Urine Culture     Status: None   Collection Time: 11/03/17  6:50 AM  Result Value Ref Range Status   Specimen Description   Final    URINE, RANDOM Performed at [facility], [address], [place], [place] [address]    Special Requests   Final    NONE Performed at [facility], [address] [place], [place] [address]    Culture   Final    NO GROWTH Performed at [facility], [place] [address] [place], [place] [address]    Report Status 11/04/2017 FINAL  Final    RADIOLOGY:  No results found.   Management plans discussed with the patient, family and they are in agreement.  CODE STATUS:     Code Status Orders  (From admission, onward)        Start     Ordered   11/03/17 1032  Full code  Continuous     11/03/17 1031    Code Status History    Date Active Date Inactive Code Status Order ID Comments User Context   09/28/2017 1044 09/29/2017 2121 Full Code [id number]  Harrie Foreman, MD ED   07/17/2017 2004 07/20/2017 1840 Full Code [id number]  Henreitta Leber, MD ED   06/12/2017 2227 06/16/2017 1910 Full Code [id number]  Salary, Avel Peace, MD Inpatient   12/28/2014 0305 12/28/2014 1919 Full Code [id number]  Lance Coon, MD Inpatient      TOTAL TIME TAKING CARE OF THIS PATIENT: *40* minutes.    Fritzi Mandes M.D on 11/04/2017  at 2:12 PM  Between 7am to 6pm - Pager - [phone] After 6pm go to [email or web address] - password [email or web address] [place] [facility]  Office   [phone]  CC: Primary care physician; Carolyn Stare [name], MD

## 2017-11-04 NOTE — Care Management Note (Signed)
Case Management Note  Patient Details  Name: Renee Mack MRN: [id number] Date of Birth: 1965-07-06   Patient to discharge home today.  PCP [employer].  Previous admissions patient had stated she obtained her insulin from [facility].  Turns out that patient has never utilized their services, and a friend has to pay out of pocket for insulin when they are able to.  RNCM confirmed with [facility]  That they have both Levimer and Humalog in stock.  Scripts were faxed over and patient to pick up after discharge.   Subjective/Objective:                    Action/Plan:   Expected Discharge Date:  11/04/17               Expected Discharge Plan:  Home/Self Care  In-House Referral:     Discharge planning Services  CM Consult, Medication Assistance  Post Acute Care Choice:    Choice offered to:  Patient  DME Arranged:    DME Agency:     HH Arranged:    [place] Agency:     Status of Service:  Completed, signed off  If discussed at [employer] of Stay Meetings, dates discussed:    Additional Comments:  Beverly Sessions, RN 11/04/2017, 2:28 PM

## 2017-11-04 NOTE — Progress Notes (Addendum)
Inpatient Diabetes Program Recommendations  AACE/ADA: New Consensus Statement on Inpatient Glycemic Control (2015)  Target Ranges:  Prepandial:   less than 140 mg/dL      Peak postprandial:   less than 180 mg/dL (1-2 hours)      Critically ill patients:  140 - 180 mg/dL   Results for Renee Mack, Renee Mack (MRN [id number]) as of 11/04/2017 09:58  Ref. Range 11/03/2017 05:57 11/03/2017 08:33 11/03/2017 09:39 11/03/2017 11:43 11/03/2017 14:57 11/03/2017 16:02 11/03/2017 17:46 11/03/2017 21:28  Glucose-Capillary Latest Ref Range: 65 - 99 mg/dL 389 (H) 343 (H) 313 (H)  10 units NOVOLOG  174 (H)  3 units NOVOLOG  134 (H)    20 units LEVEMIR 260 (H) 315 (H)  7 units NOVOLOG  218 (H)    20 units LEVEMIR   Results for Renee Mack, Renee Mack (MRN [id number]) as of 11/04/2017 09:58  Ref. Range 11/04/2017 07:48  Glucose-Capillary Latest Ref Range: 65 - 99 mg/dL 212 (H)   Admit: Abdominal pain and hyperglycemia  History: DM  Home DM Meds: Levemir 33 units AM/ 23 units QHS       Humalog 0-8 units TID with meals per SSI  Current Orders: Levemir 24 units BID        Novolog Sensitive Correction Scale/ SSI (0-9 units) TID AC     PCP: Dr. Carolyn Stare with [facility]  Last seen by PCP on 10/25/2017     Note Levemir increased to 24 units BID today  MD- Please consider the following in-hospital insulin adjustments:  1. Please start Novolog Meal Coverage: Novolog 4 units TID with meals (hold if pt eats <50% of meal)  2. Consider ordering current A1c.  Last one on file was 15.8% back on 06/12/2017.  3. May also consider sending patient home on set dose of Humalog with meals in addition to her Humalog SSI regimen     Addendum 2pm-  Spoke with patient earlier today.  Pt stated her family MD prescribes her insulin.  Was switched to Levemir and Humalog several months ago.  Verified home insulin doses as listed above.  Patient told me she has been battling vaginal cancer and has many social  stressors at home.  Her husband and two other family members passes away recently and she also stated her ongoing battle with vaginal cancer has been causing her lots of stress.  Also has lots of hospital bills and states she believes her social stressors make her glucose levels worse.  Patient stated she has access to her insulins and has a CBG meter at home.  Checks CBGs at least TID AC at home b/c she uses a sliding scale that requires her to know her CBG so she can dose her Humalog appropriately.  Stated to me she is frustrated that her CBGs always seem to stay high and that people automatically assume she is taking her insulin.  Discussed with pt that I will work with the medical team and request more insulin to try to bring her CBGs back to safe levels.  Encouraged follow up with her PCP for further DM management.   --Will follow patient during hospitalization--  Wyn Quaker RN, MSN, CDE Diabetes Coordinator Inpatient Glycemic Control Team Team Pager: [phone] (8a-5p)

## 2017-11-04 NOTE — Progress Notes (Signed)
Renee Mack Come to be D/C'd home per MD order.  Discussed prescriptions and follow up appointments with the patient. Prescriptions given to patient, medication list explained in detail. Pt verbalized understanding.  Allergies as of 11/04/2017      Reactions   Metformin And Related Shortness Of Breath   Darvon [propoxyphene] Itching   Gabapentin Swelling   Nsaids Other (See Comments)   Ulcers   Tramadol Hives      Medication List    STOP taking these medications   phenazopyridine 200 MG tablet Commonly known as:  PYRIDIUM     TAKE these medications   acetaminophen 325 MG tablet Commonly known as:  TYLENOL Take 650 mg by mouth every 6 (six) hours as needed for mild pain.   aspirin 81 MG tablet Take 81 mg by mouth daily.   cephALEXin 500 MG capsule Commonly known as:  KEFLEX Take 1 capsule (500 mg total) by mouth 3 (three) times daily.   citalopram 20 MG tablet Commonly known as:  CELEXA Take 20 mg daily by mouth.   esomeprazole 40 MG capsule Commonly known as:  NEXIUM Take 40 mg by mouth daily at 12 noon.   HYDROcodone-acetaminophen 5-325 MG tablet Commonly known as:  NORCO/VICODIN Take 1 tablet by mouth every 4 (four) hours as needed.   insulin detemir 100 UNIT/ML injection Commonly known as:  LEVEMIR Take 33 units subcutaneously in the morning and 23 units at bedtime   insulin lispro 100 UNIT/ML injection Commonly known as:  HUMALOG Take per sliding scale with meals:  For blood sugars  <150- no coverage 151-200- 2 units 201-250- 4 units 251-300- 6 units 301-350- 8 units and >351 call MD What changed:    how much to take  how to take this  when to take this  additional instructions   LORazepam 0.5 MG tablet Commonly known as:  ATIVAN Take 1 tablet (0.5 mg total) by mouth every 8 (eight) hours as needed for anxiety.   ondansetron 4 MG disintegrating tablet Commonly known as:  ZOFRAN ODT Take 1 tablet (4 mg total) by mouth every 8 (eight) hours as  needed for nausea or vomiting.   PROAIR HFA 108 (90 Base) MCG/ACT inhaler Generic drug:  albuterol Inhale 1 puff into the lungs every 6 (six) hours as needed for wheezing or shortness of breath.   protein supplement shake Liqd Commonly known as:  PREMIER PROTEIN Take 325 mLs (11 oz total) by mouth 2 (two) times daily between meals.   sucralfate 1 g tablet Commonly known as:  CARAFATE Take 1 tablet (1 g total) by mouth 4 (four) times daily.       Vitals:   11/04/17 0412 11/04/17 1304  BP: 119/70 137/78  Pulse: (!) 59 81  Resp: 18 16  Temp: 97.9 F (36.6 C) 98.3 F (36.8 C)  SpO2: 97% 96%    Skin clean, dry and intact without evidence of skin break down, no evidence of skin tears noted. IV catheter discontinued intact. Site without signs and symptoms of complications. Dressing and pressure applied. Pt denies pain at this time. No complaints noted.  An After Visit Summary was printed and given to the patient. Patient escorted via [place], and D/C home via private auto.  Renee Mack A

## 2017-11-04 NOTE — Discharge Instructions (Signed)
Check your sugars three times a day and keep a log of it.  Stop smoking.

## 2017-11-08 ENCOUNTER — Ambulatory Visit: Payer: Self-pay

## 2017-11-09 ENCOUNTER — Ambulatory Visit: Payer: Medicaid Other

## 2017-11-12 ENCOUNTER — Other Ambulatory Visit: Payer: Self-pay

## 2017-11-12 DIAGNOSIS — L03315 Cellulitis of perineum: Secondary | ICD-10-CM | POA: Diagnosis not present

## 2017-11-12 DIAGNOSIS — E119 Type 2 diabetes mellitus without complications: Secondary | ICD-10-CM | POA: Insufficient documentation

## 2017-11-12 DIAGNOSIS — F17210 Nicotine dependence, cigarettes, uncomplicated: Secondary | ICD-10-CM | POA: Insufficient documentation

## 2017-11-12 DIAGNOSIS — J449 Chronic obstructive pulmonary disease, unspecified: Secondary | ICD-10-CM | POA: Diagnosis not present

## 2017-11-12 DIAGNOSIS — Z8544 Personal history of malignant neoplasm of other female genital organs: Secondary | ICD-10-CM | POA: Diagnosis not present

## 2017-11-12 DIAGNOSIS — Z794 Long term (current) use of insulin: Secondary | ICD-10-CM | POA: Diagnosis not present

## 2017-11-12 DIAGNOSIS — Z7982 Long term (current) use of aspirin: Secondary | ICD-10-CM | POA: Insufficient documentation

## 2017-11-12 DIAGNOSIS — N898 Other specified noninflammatory disorders of vagina: Secondary | ICD-10-CM | POA: Diagnosis present

## 2017-11-12 NOTE — ED Triage Notes (Signed)
Patient reports abscess on vaginal area that she noticed yesterday.  Reports volvuar cancer.

## 2017-11-13 ENCOUNTER — Emergency Department
Admission: EM | Admit: 2017-11-13 | Discharge: 2017-11-13 | Disposition: A | Discharge status: Other Acute Inpatient Hospital | Payer: Medicaid Other | Attending: Emergency Medicine | Admitting: Emergency Medicine

## 2017-11-13 DIAGNOSIS — L03315 Cellulitis of perineum: Secondary | ICD-10-CM

## 2017-11-13 LAB — CBC WITH DIFFERENTIAL/PLATELET
BASOS ABS: 0.1 10*3/uL (ref 0–0.1)
Basophils Relative: 1 %
Eosinophils Absolute: 0.2 10*3/uL (ref 0–0.7)
Eosinophils Relative: 2 %
HEMATOCRIT: 39.5 % (ref 35.0–47.0)
Hemoglobin: 14 g/dL (ref 12.0–16.0)
LYMPHS PCT: 38 %
Lymphs Abs: 3.4 10*3/uL (ref 1.0–3.6)
MCH: 34.5 pg — ABNORMAL HIGH (ref 26.0–34.0)
MCHC: 35.4 g/dL (ref 32.0–36.0)
MCV: 97.4 fL (ref 80.0–100.0)
MONO ABS: 0.5 10*3/uL (ref 0.2–0.9)
Monocytes Relative: 6 %
Neutro Abs: 4.8 10*3/uL (ref 1.4–6.5)
Neutrophils Relative %: 53 %
Platelets: 188 10*3/uL (ref 150–440)
RBC: 4.06 MIL/uL (ref 3.80–5.20)
RDW: 12.9 % (ref 11.5–14.5)
WBC: 9 10*3/uL (ref 3.6–11.0)

## 2017-11-13 LAB — GLUCOSE, CAPILLARY: GLUCOSE-CAPILLARY: 396 mg/dL — AB (ref 65–99)

## 2017-11-13 LAB — COMPREHENSIVE METABOLIC PANEL
ALK PHOS: 107 U/L (ref 38–126)
ALT: 15 U/L (ref 14–54)
AST: 31 U/L (ref 15–41)
Albumin: 3.3 g/dL — ABNORMAL LOW (ref 3.5–5.0)
Anion gap: 9 (ref 5–15)
BILIRUBIN TOTAL: 0.6 mg/dL (ref 0.3–1.2)
BUN: 11 mg/dL (ref 6–20)
CALCIUM: 9.2 mg/dL (ref 8.9–10.3)
CHLORIDE: 97 mmol/L — AB (ref 101–111)
CO2: 28 mmol/L (ref 22–32)
CREATININE: 0.41 mg/dL — AB (ref 0.44–1.00)
GFR calc Af Amer: >60 mL/min (ref >60)
Glucose, Bld: 353 mg/dL — ABNORMAL HIGH (ref 65–99)
Potassium: 3.4 mmol/L — ABNORMAL LOW (ref 3.5–5.1)
Sodium: 134 mmol/L — ABNORMAL LOW (ref 135–145)
TOTAL PROTEIN: 7.2 g/dL (ref 6.5–8.1)

## 2017-11-13 MED ORDER — SODIUM CHLORIDE 0.9 % IV BOLUS
1000.0000 mL | Freq: Once | INTRAVENOUS | Status: AC
  Administered 2017-11-13: 1000 mL via INTRAVENOUS

## 2017-11-13 MED ORDER — ONDANSETRON HCL 4 MG/2ML IJ SOLN
INTRAMUSCULAR | Status: AC
  Filled 2017-11-13: qty 2

## 2017-11-13 MED ORDER — MORPHINE SULFATE (PF) 4 MG/ML IV SOLN
4.0000 mg | Freq: Once | INTRAVENOUS | Status: AC
Start: 2017-11-13 — End: 2017-11-13
  Administered 2017-11-13: 4 mg via INTRAVENOUS

## 2017-11-13 MED ORDER — DIPHENHYDRAMINE HCL 50 MG/ML IJ SOLN
25.0000 mg | Freq: Once | INTRAMUSCULAR | Status: AC
  Administered 2017-11-13: 25 mg via INTRAVENOUS

## 2017-11-13 MED ORDER — VANCOMYCIN HCL IN DEXTROSE 1-5 GM/200ML-% IV SOLN
INTRAVENOUS | Status: AC
  Filled 2017-11-13: qty 200

## 2017-11-13 MED ORDER — CEFAZOLIN SODIUM-DEXTROSE 1-4 GM/50ML-% IV SOLN
1.0000 g | Freq: Once | INTRAVENOUS | Status: AC
  Administered 2017-11-13: 1 g via INTRAVENOUS
  Filled 2017-11-13: qty 50

## 2017-11-13 MED ORDER — MORPHINE SULFATE (PF) 4 MG/ML IV SOLN
INTRAVENOUS | Status: AC
  Filled 2017-11-13: qty 1

## 2017-11-13 MED ORDER — VANCOMYCIN HCL IN DEXTROSE 1-5 GM/200ML-% IV SOLN
1000.0000 mg | Freq: Once | INTRAVENOUS | Status: AC
  Administered 2017-11-13: 1000 mg via INTRAVENOUS

## 2017-11-13 MED ORDER — ONDANSETRON HCL 4 MG/2ML IJ SOLN
4.0000 mg | Freq: Once | INTRAMUSCULAR | Status: AC
  Administered 2017-11-13: 4 mg via INTRAVENOUS

## 2017-11-13 MED ORDER — VANCOMYCIN HCL 10 G IV SOLR: 1.0000 g | Freq: Once | INTRAVENOUS | Status: DC

## 2017-11-13 MED ORDER — DIPHENHYDRAMINE HCL 50 MG/ML IJ SOLN
INTRAMUSCULAR | Status: AC
  Filled 2017-11-13: qty 1

## 2017-11-13 NOTE — ED Notes (Signed)
Assessment: pt with history of vulvar cancer. Pt states she believes she has an abscess to left vulva. Pt with redness noted to perineum and bilateral inner buttocks. Pt has redness extending up to mons. Pt with swelling noted to left vulva. Pt states left vulva is tender to touch. Pt denies known fever. Pt states has difficulty controlling blood sugars and is insulin dependent. Pt states has had frequent admissions for DKA.

## 2017-11-13 NOTE — ED Notes (Signed)
Po fluids provided.  

## 2017-11-13 NOTE — ED Notes (Signed)
Friend up to stat desk again stating that she just saw a person who came in after the patient and now they were getting to go home.  Explained that person she was speaking of had not been seen by a provider and was going home.

## 2017-11-13 NOTE — ED Notes (Signed)
[name] friend into room and very concerned about wait time for pt in treatment room. With pt's permission, friend updated on care plan. Friend continues to verbalize concern regarding pt's wait in room status for md disposition.

## 2017-11-13 NOTE — ED Notes (Signed)
Iv pump malfunction. Vancomycin placed to gravity. approx 1/4 of vancomycin bag left to infuse. Will hang ancef after vancomycin complete.

## 2017-11-13 NOTE — ED Provider Notes (Signed)
[facility] Emergency Department Provider Note  ____________________________________________   First MD Initiated Contact with Patient 11/13/17 [phone]     (approximate)  I have reviewed the triage vital signs and the nursing notes.   HISTORY  Chief Complaint Abscess    HPI Dynisha Due is a 52 y.o. female with a history of vulvar cancer status post multiple resections treated at [facility] by Dr. [name] who presents for evaluation of redness and pain on the left lower labia starting yesterday.  She reports that it is severely painful and tender to palpation and with any amount of movement.  Nothing in particular makes it better and it has spread.  She says that the entire area "down there" is painful from time to time but not nearly as severe as it is currently.  She denies fever/chills, chest pain, shortness of breath, nausea, vomiting, and abdominal pain.  She reports that she needs another surgical procedure, but her A1c is 16 and they need to get it down to 8 before she can be eligible for the procedure.  Past Medical History:  Diagnosis Date  . Cancer ([place])    vulvular  . COPD (chronic obstructive pulmonary disease) ([place])   . Diabetes mellitus without complication ([employer])   . GERD (gastroesophageal reflux disease)   . HLD (hyperlipidemia)   . Pancreatitis   . Shingles     Patient Active Problem List   Diagnosis Date Noted  . Protein-calorie malnutrition, severe 07/18/2017  . DKA (diabetic ketoacidoses) ([place]) 07/17/2017  . Acute ischemic enteritis ([place]) 06/12/2017  . Pyelonephritis 12/28/2014  . Type 2 diabetes mellitus ([place]) 12/28/2014  . COPD (chronic obstructive pulmonary disease) ([place]) 12/28/2014  . GERD (gastroesophageal reflux disease) 12/28/2014  . History of shingles 12/28/2014  . HLD (hyperlipidemia) 12/28/2014    Past Surgical History:  Procedure Laterality Date  . ABDOMINAL HYSTERECTOMY     partial  . ECTOPIC PREGNANCY SURGERY Left   .  LYMPHADENECTOMY    . SPLENECTOMY, PARTIAL    . vulvulasectomy      Prior to Admission medications   Medication Sig Start Date End Date Taking? Authorizing Provider  acetaminophen (TYLENOL) 325 MG tablet Take 650 mg by mouth every 6 (six) hours as needed for mild pain.     [provider]  albuterol (PROAIR HFA) 108 (90 Base) MCG/ACT inhaler Inhale 1 puff into the lungs every 6 (six) hours as needed for wheezing or shortness of breath.     [provider]  aspirin 81 MG tablet Take 81 mg by mouth daily.    [provider]  cephALEXin (KEFLEX) 500 MG capsule Take 1 capsule (500 mg total) by mouth 3 (three) times daily. 10/27/17   Harvest Dark, MD  citalopram (CELEXA) 20 MG tablet Take 20 mg daily by mouth.    [provider]  esomeprazole (NEXIUM) 40 MG capsule Take 40 mg by mouth daily at 12 noon.    [provider]  HYDROcodone-acetaminophen (NORCO/VICODIN) 5-325 MG tablet Take 1 tablet by mouth every 4 (four) hours as needed. 10/27/17   Harvest Dark, MD  insulin detemir (LEVEMIR) 100 UNIT/ML injection Take 33 units subcutaneously in the morning and 23 units at bedtime 11/04/17   Fritzi Mandes, MD  insulin lispro (HUMALOG) 100 UNIT/ML injection Take per sliding scale with meals:  For blood sugars  <150- no coverage 151-200- 2 units 201-250- 4 units 251-300- 6 units 301-350- 8 units and >351 call MD 11/04/17   Posey Pronto,  [place], MD  LORazepam (ATIVAN) 0.5 MG tablet Take 1 tablet (0.5 mg total) by mouth every 8 (eight) hours as needed for anxiety. 02/06/17 02/06/18  Lisa Roca, MD  ondansetron (ZOFRAN ODT) 4 MG disintegrating tablet Take 1 tablet (4 mg total) by mouth every 8 (eight) hours as needed for nausea or vomiting. 09/01/17   Alfred Levins, [place], MD  protein supplement shake (PREMIER PROTEIN) LIQD Take 325 mLs (11 oz total) by mouth 2 (two) times daily between meals. 11/04/17   Fritzi Mandes, MD  sucralfate (CARAFATE) 1 g tablet Take 1 tablet (1 g  total) by mouth 4 (four) times daily. 08/01/17 08/01/18  Earleen Newport, MD    Allergies Metformin and related; Darvon [propoxyphene]; Gabapentin; Nsaids; and Tramadol  Family History  Problem Relation Age of Onset  . Cancer Mother   . Osteoporosis Sister   . Heart disease Brother   . Heart attack Father     Social History Social History   Tobacco Use  . Smoking status: Current Every Day Smoker    Packs/day: 0.50    Years: 30.00    Pack years: 15.00    Types: Cigarettes  . Smokeless tobacco: Never Used  Substance Use Topics  . Alcohol use: No  . Drug use: No    Review of Systems Constitutional: No fever/chills Eyes: No visual changes. ENT: No sore throat. Cardiovascular: Denies chest pain. Respiratory: Denies shortness of breath. Gastrointestinal: No abdominal pain.  No nausea, no vomiting.  No diarrhea.  No constipation. Genitourinary: There is a question of labs to make sure I have had this before the Musculoskeletal: Negative for neck pain.  Negative for back pain. Integumentary: See GU exam above Neurological: Negative for headaches, focal weakness or numbness.   ____________________________________________   PHYSICAL EXAM:  VITAL SIGNS: ED Triage Vitals [11/12/17 2033]  Enc Vitals Group     BP 105/65     Pulse Rate 87     Resp 18     Temp 98.3 F (36.8 C)     Temp Source Oral     SpO2 94 %     Weight      Height      Head Circumference      Peak Flow      Pain Score      Pain Loc      Pain Edu?      Excl. in [place]?     Constitutional: Alert and oriented. Well appearing and in no acute distress. Eyes: Conjunctivae are normal.  Head: Atraumatic. Nose: No congestion/rhinnorhea. Mouth/Throat: Mucous membranes are moist. Neck: No stridor.  No meningeal signs.   Cardiovascular: Normal rate, regular rhythm. Good peripheral circulation. Grossly normal heart sounds. Respiratory: Normal respiratory effort.  No retractions. Lungs  CTAB. Gastrointestinal: Soft and nontender. No distention.  GU: The patient has some combination what appears to be acute and chronic changes all around the lower vulva and perineum.  There is extensive erythema that is tender to palpation on both sides and extends onto the buttocks but she has an area at the border of the left lower labia and buttocks that is particularly inflamed and somewhat excoriated and exquisitely tender.  There is no induration or fluctuance to suggest an abscess but the area is so exquisitely tender it is difficult to obtain a good exam.  He has an almost scalded appearance.  The patient is not able to tell me if she always has extensive erythema in the area or not. Musculoskeletal:  No lower extremity tenderness nor edema. No gross deformities of extremities. Neurologic:  Normal speech and language. No gross focal neurologic deficits are appreciated.  Skin:  Skin is warm, dry and intact. No rash noted. Psychiatric: Mood and affect are normal. Speech and behavior are normal.  ____________________________________________   LABS (all labs ordered are listed, but only abnormal results are displayed)  Labs Reviewed  GLUCOSE, CAPILLARY - Abnormal; Notable for the following components:      Result Value   Glucose-Capillary 396 (*)    All other components within normal limits  CBC WITH DIFFERENTIAL/PLATELET - Abnormal; Notable for the following components:   MCH 34.5 (*)    All other components within normal limits  COMPREHENSIVE METABOLIC PANEL - Abnormal; Notable for the following components:   Sodium 134 (*)    Potassium 3.4 (*)    Chloride 97 (*)    Glucose, Bld 353 (*)    Creatinine, Ser 0.41 (*)    Albumin 3.3 (*)    All other components within normal limits  CULTURE, BLOOD (ROUTINE X 2)  CULTURE, BLOOD (ROUTINE X 2)  CBG MONITORING, ED   ____________________________________________  EKG  None - EKG not ordered by ED  physician ____________________________________________  RADIOLOGY   ED MD interpretation: No indication for imaging  Official radiology report(s): No results found.  ____________________________________________   PROCEDURES  Critical Care performed: No   Procedure(s) performed:   Procedures   ____________________________________________   INITIAL IMPRESSION / ASSESSMENT AND PLAN / ED COURSE  As part of my medical decision making, I reviewed the following data within the [place] notes reviewed and incorporated, Labs reviewed , Old chart reviewed and A consult was requested and obtained from this/these consultant(s) Gyn-Onc at [facility] (Dr. [name]).    Differential diagnosis includes, but is not limited to, cellulitis, abscess, necrotizing fasciitis/Fournier's gangrene, worsening or spreading cancer.  The patient's presentation is most consistent with cellulitis.  There is no palpable fluid collection or drainable abscess, but I am concerned about the degree of erythema and areas of excoriation on the patient's perineum and lower vulva, particularly on the left labia.  I reviewed her old chart and prior clinic notes do not indicate such an area of extensive skin damage even after her prior cancer treatment and surgical resections.  The patient is in a significant amount of pain with any amount of movement and pressure.  She is a poorly controlled diabetic and may not respond well to outpatient treatment.  Fortunately her labs are generally reassuring.  She has no leukocytosis and her comprehensive metabolic panel is essentially normal other than hyperglycemia.  Blood cultures were drawn and sent.  Patient is having no urinary symptoms so urinalysis was not sent.  Vital signs are within normal limits.  I will call to [facility] and try to speak with the on-call Gyn-Onc provider to discuss the case and asked for their advice and recommendations, treatment  recommendations, and whether or not the patient would benefit from transfer to [facility] further direct evaluation.  I discussed this with the patient and she agrees with the plan as well.  Clinical Course as of Nov 14 946  Sun Nov 13, 2017  0405 Spoke by phone with [facility], they will get GynOnc on the phone and call me back to discuss the case.   [CF]  0424 I spoke by phone with Dr. [name] who is the patient's primary guy knock provider.  We discussed  the case and Dr. [name] agreed that the patient would be best served by transfer to her service.  She accepted the patient and we are working on transfer.  She agreed with empiric antibiotic treatment at this time and deferred to my recommendation which is Ancef 1 g IV and vancomycin 1 g IV.  I updated the patient and she is happy with the outcome.  She received morphine 4 mg IV which helped with her pain.  She is stable for transfer.   [CF]  0729 Antibiotics complete.  Patient had some redness and itching with vancomycin, received Benadryl 25 mg IV.  Stable for transfer.  EMS will arrive soon.   [CF]    Clinical Course User Index [CF] Hinda Kehr, MD    ____________________________________________  FINAL CLINICAL IMPRESSION(S) / ED DIAGNOSES  Final diagnoses:  Cellulitis of perineum     MEDICATIONS GIVEN DURING THIS VISIT:  Medications  sodium chloride 0.9 % bolus 1,000 mL (0 mLs Intravenous Stopped 11/13/17 0315)  morphine 4 MG/ML injection 4 mg (4 mg Intravenous Given 11/13/17 0404)  ondansetron (ZOFRAN) injection 4 mg (4 mg Intravenous Given 11/13/17 0405)  ceFAZolin (ANCEF) IVPB 1 g/50 mL premix (0 g Intravenous Stopped 11/13/17 0720)  vancomycin (VANCOCIN) IVPB 1000 mg/200 mL premix (0 mg Intravenous Stopped 11/13/17 0653)  diphenhydrAMINE (BENADRYL) injection 25 mg (25 mg Intravenous Given 11/13/17 9794)     ED Discharge Orders    None       Note:  This document was prepared using Dragon voice recognition software  and may include unintentional dictation errors.    Hinda Kehr, MD 11/13/17 [phone]

## 2017-11-13 NOTE — ED Notes (Signed)
emtala reviewed by this RN 

## 2017-11-13 NOTE — ED Notes (Signed)
Friend up to desk asking why patient has not gone to a room.  Explained process to friend who verbalized understand.  But continued to state that "its not right."

## 2017-11-13 NOTE — ED Notes (Signed)
Rojelio Brenner, [facility] to waiting room to speak with patient and friend.

## 2017-11-13 NOTE — ED Notes (Signed)
Attempted iv insertion x2 without success. Labs secured and sent previously. Charge rn in to attempt iv insertion. This rn in with dr. Karma Greaser for female examination assist.

## 2017-11-13 NOTE — ED Notes (Signed)
Pt complains of itching to entire body, no hives or angioedema noted. Will ask md for benadryl.

## 2017-11-13 NOTE — ED Notes (Signed)
Report to cassandra, rn. 

## 2017-11-18 LAB — CULTURE, BLOOD (ROUTINE X 2)
Culture: NO GROWTH
Culture: NO GROWTH
Special Requests: ADEQUATE
Special Requests: ADEQUATE

## 2017-11-29 ENCOUNTER — Emergency Department
Admission: EM | Admit: 2017-11-29 | Discharge: 2017-11-29 | Disposition: A | Discharge status: Home / Self Care | Payer: Medicaid Other | Attending: Emergency Medicine | Admitting: Emergency Medicine

## 2017-11-29 ENCOUNTER — Other Ambulatory Visit: Payer: Self-pay

## 2017-11-29 ENCOUNTER — Encounter: Payer: Self-pay | Admitting: Emergency Medicine

## 2017-11-29 DIAGNOSIS — F17210 Nicotine dependence, cigarettes, uncomplicated: Secondary | ICD-10-CM | POA: Diagnosis not present

## 2017-11-29 DIAGNOSIS — R112 Nausea with vomiting, unspecified: Secondary | ICD-10-CM

## 2017-11-29 DIAGNOSIS — E119 Type 2 diabetes mellitus without complications: Secondary | ICD-10-CM | POA: Insufficient documentation

## 2017-11-29 DIAGNOSIS — Z7982 Long term (current) use of aspirin: Secondary | ICD-10-CM | POA: Insufficient documentation

## 2017-11-29 DIAGNOSIS — Z8544 Personal history of malignant neoplasm of other female genital organs: Secondary | ICD-10-CM | POA: Diagnosis not present

## 2017-11-29 DIAGNOSIS — Z794 Long term (current) use of insulin: Secondary | ICD-10-CM | POA: Insufficient documentation

## 2017-11-29 DIAGNOSIS — J449 Chronic obstructive pulmonary disease, unspecified: Secondary | ICD-10-CM | POA: Insufficient documentation

## 2017-11-29 DIAGNOSIS — N390 Urinary tract infection, site not specified: Secondary | ICD-10-CM | POA: Diagnosis not present

## 2017-11-29 DIAGNOSIS — R1013 Epigastric pain: Secondary | ICD-10-CM | POA: Diagnosis present

## 2017-11-29 DIAGNOSIS — Z79899 Other long term (current) drug therapy: Secondary | ICD-10-CM | POA: Diagnosis not present

## 2017-11-29 LAB — COMPREHENSIVE METABOLIC PANEL
ALK PHOS: 133 U/L — AB (ref 38–126)
ALT: 15 U/L (ref 14–54)
AST: 20 U/L (ref 15–41)
Albumin: 4 g/dL (ref 3.5–5.0)
Anion gap: 11 (ref 5–15)
BILIRUBIN TOTAL: 0.4 mg/dL (ref 0.3–1.2)
BUN: 11 mg/dL (ref 6–20)
CALCIUM: 9.9 mg/dL (ref 8.9–10.3)
CO2: 25 mmol/L (ref 22–32)
CREATININE: 0.51 mg/dL (ref 0.44–1.00)
Chloride: 97 mmol/L — ABNORMAL LOW (ref 101–111)
Glucose, Bld: 338 mg/dL — ABNORMAL HIGH (ref 65–99)
Potassium: 4.5 mmol/L (ref 3.5–5.1)
Sodium: 133 mmol/L — ABNORMAL LOW (ref 135–145)
TOTAL PROTEIN: 8.7 g/dL — AB (ref 6.5–8.1)

## 2017-11-29 LAB — URINALYSIS, COMPLETE (UACMP) WITH MICROSCOPIC
BILIRUBIN URINE: NEGATIVE
Hgb urine dipstick: NEGATIVE
KETONES UR: NEGATIVE mg/dL
Nitrite: NEGATIVE
PH: 6 (ref 5.0–8.0)
Protein, ur: NEGATIVE mg/dL
SPECIFIC GRAVITY, URINE: 1.008 (ref 1.005–1.030)

## 2017-11-29 LAB — CBC
HCT: 45.1 % (ref 35.0–47.0)
Hemoglobin: 15.5 g/dL (ref 12.0–16.0)
MCH: 33.1 pg (ref 26.0–34.0)
MCHC: 34.3 g/dL (ref 32.0–36.0)
MCV: 96.6 fL (ref 80.0–100.0)
PLATELETS: 366 10*3/uL (ref 150–440)
RBC: 4.67 MIL/uL (ref 3.80–5.20)
RDW: 13.5 % (ref 11.5–14.5)
WBC: 10 10*3/uL (ref 3.6–11.0)

## 2017-11-29 LAB — LIPASE, BLOOD: Lipase: 20 U/L (ref 11–51)

## 2017-11-29 MED ORDER — ONDANSETRON HCL 4 MG/2ML IJ SOLN
4.0000 mg | Freq: Once | INTRAMUSCULAR | Status: AC
  Administered 2017-11-29: 4 mg via INTRAVENOUS
  Filled 2017-11-29: qty 2

## 2017-11-29 MED ORDER — ONDANSETRON HCL 4 MG PO TABS: 4.0000 mg | ORAL_TABLET | Freq: Three times a day (TID) | ORAL | 0 refills | Status: AC | PRN

## 2017-11-29 MED ORDER — CEPHALEXIN 500 MG PO CAPS: 500.0000 mg | ORAL_CAPSULE | Freq: Two times a day (BID) | ORAL | 0 refills | Status: AC

## 2017-11-29 MED ORDER — SODIUM CHLORIDE 0.9 % IV BOLUS
1000.0000 mL | Freq: Once | INTRAVENOUS | Status: AC
Start: 2017-11-29 — End: 2017-11-29
  Administered 2017-11-29: 1000 mL via INTRAVENOUS

## 2017-11-29 MED ORDER — MORPHINE SULFATE (PF) 4 MG/ML IV SOLN
4.0000 mg | Freq: Once | INTRAVENOUS | Status: AC
  Administered 2017-11-29: 4 mg via INTRAVENOUS
  Filled 2017-11-29: qty 1

## 2017-11-29 MED ORDER — CEFTRIAXONE SODIUM 1 G IJ SOLR
1.0000 g | Freq: Once | INTRAMUSCULAR | Status: AC
  Administered 2017-11-29: 1 g via INTRAVENOUS
  Filled 2017-11-29: qty 10

## 2017-11-29 NOTE — ED Provider Notes (Addendum)
[facility] Emergency Department Provider Note ____________________________________________   First MD Initiated Contact with Patient 11/29/17 1209     (approximate)  I have reviewed the triage vital signs and the nursing notes.   HISTORY  Chief Complaint Abdominal Pain and Emesis    HPI Renee Mack is a 52 y.o. female with PMH as noted below including history of COPD, diabetes, vulvar cancer, who presents with epigastric abdominal pain since last night, radiating to her back, and preceded by several episodes of vomiting which also started last night.  She also reports some hyperglycemia, as well as dysuria.  The patient denies associated fever or diarrhea.  She reports similar pain in the past when she has had pancreatitis.  Past Medical History:  Diagnosis Date  . Cancer ([place])    vulvular  . COPD (chronic obstructive pulmonary disease) ([place])   . Diabetes mellitus without complication ([place])   . GERD (gastroesophageal reflux disease)   . HLD (hyperlipidemia)   . Pancreatitis   . Shingles     Patient Active Problem List   Diagnosis Date Noted  . Protein-calorie malnutrition, severe 07/18/2017  . DKA (diabetic ketoacidoses) ([place]) 07/17/2017  . Acute ischemic enteritis ([place]) 06/12/2017  . Pyelonephritis 12/28/2014  . Type 2 diabetes mellitus ([place]) 12/28/2014  . COPD (chronic obstructive pulmonary disease) ([place]) 12/28/2014  . GERD (gastroesophageal reflux disease) 12/28/2014  . History of shingles 12/28/2014  . HLD (hyperlipidemia) 12/28/2014    Past Surgical History:  Procedure Laterality Date  . ABDOMINAL HYSTERECTOMY     partial  . ECTOPIC PREGNANCY SURGERY Left   . LYMPHADENECTOMY    . SPLENECTOMY, PARTIAL    . vulvulasectomy      Prior to Admission medications   Medication Sig Start Date End Date Taking? Authorizing Provider  acetaminophen (TYLENOL) 325 MG tablet Take 650 mg by mouth every 6 (six) hours as needed for mild pain.      [provider]  albuterol (PROAIR HFA) 108 (90 Base) MCG/ACT inhaler Inhale 1 puff into the lungs every 6 (six) hours as needed for wheezing or shortness of breath.     [provider]  aspirin 81 MG tablet Take 81 mg by mouth daily.    [provider]  cephALEXin (KEFLEX) 500 MG capsule Take 1 capsule (500 mg total) by mouth 2 (two) times daily for 7 days. 11/30/17 12/07/17  Arta Silence, MD  citalopram (CELEXA) 20 MG tablet Take 20 mg daily by mouth.    [provider]  esomeprazole (NEXIUM) 40 MG capsule Take 40 mg by mouth daily at 12 noon.    [provider]  HYDROcodone-acetaminophen (NORCO/VICODIN) 5-325 MG tablet Take 1 tablet by mouth every 4 (four) hours as needed. 10/27/17   Harvest Dark, MD  insulin detemir (LEVEMIR) 100 UNIT/ML injection Take 33 units subcutaneously in the morning and 23 units at bedtime 11/04/17   Fritzi Mandes, MD  insulin lispro (HUMALOG) 100 UNIT/ML injection Take per sliding scale with meals:  For blood sugars  <150- no coverage 151-200- 2 units 201-250- 4 units 251-300- 6 units 301-350- 8 units and >351 call MD 11/04/17   Fritzi Mandes, MD  LORazepam (ATIVAN) 0.5 MG tablet Take 1 tablet (0.5 mg total) by mouth every 8 (eight) hours as needed for anxiety. 02/06/17 02/06/18  Lisa Roca, MD  ondansetron (ZOFRAN ODT) 4 MG disintegrating tablet Take 1 tablet (4 mg total) by mouth every 8 (eight) hours as needed for nausea or vomiting. 09/01/17  Alfred Levins, [place], MD  ondansetron [facility]) 4 MG tablet Take 1 tablet (4 mg total) by mouth every 8 (eight) hours as needed for up to 3 days for nausea or vomiting. 11/29/17 12/02/17  Arta Silence, MD  protein supplement shake (PREMIER PROTEIN) LIQD Take 325 mLs (11 oz total) by mouth 2 (two) times daily between meals. 11/04/17   Fritzi Mandes, MD  sucralfate (CARAFATE) 1 g tablet Take 1 tablet (1 g total) by mouth 4 (four) times daily. 08/01/17 08/01/18  Earleen Newport,  MD    Allergies Bee venom; Metformin and related; Darvon [propoxyphene]; Gabapentin; Nsaids; and Tramadol  Family History  Problem Relation Age of Onset  . Cancer Mother   . Osteoporosis Sister   . Heart disease Brother   . Heart attack Father     Social History Social History   Tobacco Use  . Smoking status: Current Every Day Smoker    Packs/day: 0.50    Years: 30.00    Pack years: 15.00    Types: Cigarettes  . Smokeless tobacco: Never Used  Substance Use Topics  . Alcohol use: No  . Drug use: No    Review of Systems  Constitutional: No fever. Eyes: No redness. ENT: No sore throat. Cardiovascular: Denies chest pain. Respiratory: Denies shortness of breath. Gastrointestinal: Positive for vomiting.  Genitourinary: Positive for dysuria.  Musculoskeletal: Positive for back pain. Skin: Negative for rash. Neurological: Negative for headache.   ____________________________________________   PHYSICAL EXAM:  VITAL SIGNS: ED Triage Vitals  Enc Vitals Group     BP 11/29/17 1112 132/85     Pulse Rate 11/29/17 1112 78     Resp 11/29/17 1112 18     Temp 11/29/17 1112 98.3 F (36.8 C)     Temp Source 11/29/17 1112 Oral     SpO2 11/29/17 1112 95 %     Weight 11/29/17 1113 130 lb (59 kg)     Height 11/29/17 1113 5\' 1"  (1.549 m)     Head Circumference --      Peak Flow --      Pain Score 11/29/17 1113 10     Pain Loc --      Pain Edu? --      Excl. in [place]? --     Constitutional: Alert and oriented. Well appearing and in no acute distress. Eyes: Conjunctivae are normal.  No scleral icterus. Head: Atraumatic. Nose: No congestion/rhinnorhea. Mouth/Throat: Mucous membranes are somewhat dry.   Neck: Normal range of motion.  Cardiovascular: Normal rate, regular rhythm. Grossly normal heart sounds.  Good peripheral circulation. Respiratory: Normal respiratory effort.  No retractions. Lungs CTAB. Gastrointestinal: Soft with mild epigastric tenderness. No distention.    Genitourinary: No flank tenderness. Musculoskeletal: Extremities warm and well perfused.  Neurologic:  Normal speech and language. No gross focal neurologic deficits are appreciated.  Skin:  Skin is warm and dry. No rash noted. Psychiatric: Mood and affect are normal. Speech and behavior are normal.  ____________________________________________   LABS (all labs ordered are listed, but only abnormal results are displayed)  Labs Reviewed  COMPREHENSIVE METABOLIC PANEL - Abnormal; Notable for the following components:      Result Value   Sodium 133 (*)    Chloride 97 (*)    Glucose, Bld 338 (*)    Total Protein 8.7 (*)    Alkaline Phosphatase 133 (*)    All other components within normal limits  URINALYSIS, COMPLETE (UACMP) WITH MICROSCOPIC - Abnormal; Notable for the following components:  Color, Urine STRAW (*)    APPearance CLEAR (*)    Glucose, UA >=500 (*)    Leukocytes, UA SMALL (*)    Bacteria, UA RARE (*)    All other components within normal limits  LIPASE, BLOOD  CBC   ____________________________________________  EKG  ED ECG REPORT I, Arta Silence, the attending physician, personally viewed and interpreted this ECG.  Date: 11/29/2017 EKG Time: 1121 Rate: 72 Rhythm: normal sinus rhythm QRS Axis: normal Intervals: normal ST/T Wave abnormalities: normal Narrative Interpretation: no evidence of acute ischemia   ____________________________________________  RADIOLOGY    ____________________________________________   PROCEDURES  Procedure(s) performed: No  Procedures  Critical Care performed: No ____________________________________________   INITIAL IMPRESSION / ASSESSMENT AND PLAN / ED COURSE  Pertinent labs & imaging results that were available during my care of the patient were reviewed by me and considered in my medical decision making (see chart for details).  52 year old female with PMH as noted above presents with vomiting since  last night and associated epigastric pain, as well as hyperglycemia.  I reviewed the past medical records in Epic; the patient was seen in the ED late last month for likely vulvar cellulitis.  She was also admitted to the ED last month with urinary tract infection and DKA.  On exam, the patient is relatively well-appearing, vital signs are normal, and the abdomen is soft.  She does have some mild epigastric tenderness.  Differential includes pancreatitis, gastritis, acute gastroenteritis, gastroparesis, urinary tract infection.  Plan: IV fluids, symptomatic treatment, labs, and reassess.  ----------------------------------------- 3:33 PM on 11/29/2017 -----------------------------------------  Lab work-up is unremarkable except for elevated glucose.  Patient's UA, however, shows leukocytes and WBCs, consistent with possible UTI.  Given that the patient does have dysuria, I will treat for UTI.  I reviewed the prior urine cultures over the last several months and they have been negative.  The patient states that her symptoms are significantly improved.  I ordered a dose of ceftriaxone here and will discharge home with Keflex.  The patient feels well and would like to go home.  Return precautions given, and she expresses understanding.  ____________________________________________   FINAL CLINICAL IMPRESSION(S) / ED DIAGNOSES  Final diagnoses:  Urinary tract infection without hematuria, site unspecified  Non-intractable vomiting with nausea, unspecified vomiting type      NEW MEDICATIONS STARTED DURING THIS VISIT:  New Prescriptions   CEPHALEXIN (KEFLEX) 500 MG CAPSULE    Take 1 capsule (500 mg total) by mouth 2 (two) times daily for 7 days.   ONDANSETRON (ZOFRAN) 4 MG TABLET    Take 1 tablet (4 mg total) by mouth every 8 (eight) hours as needed for up to 3 days for nausea or vomiting.     Note:  This document was prepared using Dragon voice recognition software and may include  unintentional dictation errors.        Arta Silence, MD 11/29/17 [phone]

## 2017-11-29 NOTE — ED Triage Notes (Signed)
Started vomiting yesterday.  Then upper mid abd pain. That feels like pancreatitis.  Also sugar has been out of control.

## 2017-11-29 NOTE — Discharge Instructions (Addendum)
Take the antibiotic as prescribed and finish the full course.  Return to the ER for new, worsening, persistent severe abdominal pain, vomiting, fever, weakness, or any other new or worsening symptoms that concern you.

## 2017-12-06 ENCOUNTER — Ambulatory Visit: Payer: Medicaid Other | Admitting: Pharmacy Technician

## 2017-12-06 DIAGNOSIS — Z79899 Other long term (current) drug therapy: Secondary | ICD-10-CM

## 2017-12-06 NOTE — Progress Notes (Signed)
Discovered during eligibility appointment that patient has full Medicaid.  Patient does not meet the eligibility criteria for [facility].  Patient acknowledged that she understood that [facility] would be unable to provide medication assistance.  Assisted patient with the scheduling of an appointment with a provider at [facility]. Patient has an appointment on Wednesday, December 21, 2017 at 11:20a.m.  Provided patient with resource information based on her particular needs.  [facility]

## 2017-12-11 ENCOUNTER — Other Ambulatory Visit: Payer: Self-pay

## 2017-12-11 ENCOUNTER — Encounter: Payer: Self-pay | Admitting: Emergency Medicine

## 2017-12-11 DIAGNOSIS — N12 Tubulo-interstitial nephritis, not specified as acute or chronic: Secondary | ICD-10-CM | POA: Insufficient documentation

## 2017-12-11 DIAGNOSIS — Z794 Long term (current) use of insulin: Secondary | ICD-10-CM | POA: Insufficient documentation

## 2017-12-11 DIAGNOSIS — J449 Chronic obstructive pulmonary disease, unspecified: Secondary | ICD-10-CM | POA: Insufficient documentation

## 2017-12-11 DIAGNOSIS — Z8544 Personal history of malignant neoplasm of other female genital organs: Secondary | ICD-10-CM | POA: Diagnosis not present

## 2017-12-11 DIAGNOSIS — F17210 Nicotine dependence, cigarettes, uncomplicated: Secondary | ICD-10-CM | POA: Diagnosis not present

## 2017-12-11 DIAGNOSIS — E119 Type 2 diabetes mellitus without complications: Secondary | ICD-10-CM | POA: Insufficient documentation

## 2017-12-11 DIAGNOSIS — R300 Dysuria: Secondary | ICD-10-CM | POA: Diagnosis present

## 2017-12-11 DIAGNOSIS — Z7982 Long term (current) use of aspirin: Secondary | ICD-10-CM | POA: Insufficient documentation

## 2017-12-11 DIAGNOSIS — Z79899 Other long term (current) drug therapy: Secondary | ICD-10-CM | POA: Diagnosis not present

## 2017-12-11 LAB — URINALYSIS, COMPLETE (UACMP) WITH MICROSCOPIC
Bilirubin Urine: NEGATIVE
Glucose, UA: NEGATIVE mg/dL
Ketones, ur: NEGATIVE mg/dL
Nitrite: NEGATIVE
PH: 7 (ref 5.0–8.0)
Protein, ur: NEGATIVE mg/dL
SPECIFIC GRAVITY, URINE: 1.004 — AB (ref 1.005–1.030)

## 2017-12-11 NOTE — ED Triage Notes (Signed)
Pt reports painful urination since last night. Pt ambulatory to triage with steady gait in NAD.

## 2017-12-12 ENCOUNTER — Emergency Department
Admission: EM | Admit: 2017-12-12 | Discharge: 2017-12-12 | Disposition: A | Discharge status: Home / Self Care | Payer: Medicaid Other | Attending: Emergency Medicine | Admitting: Emergency Medicine

## 2017-12-12 DIAGNOSIS — N12 Tubulo-interstitial nephritis, not specified as acute or chronic: Secondary | ICD-10-CM

## 2017-12-12 LAB — CBC WITH DIFFERENTIAL/PLATELET
Basophils Absolute: 0.1 10*3/uL (ref 0–0.1)
Basophils Relative: 1 %
EOS ABS: 0.3 10*3/uL (ref 0–0.7)
Eosinophils Relative: 3 %
HEMATOCRIT: 41.9 % (ref 35.0–47.0)
HEMOGLOBIN: 14.1 g/dL (ref 12.0–16.0)
Lymphocytes Relative: 42 %
Lymphs Abs: 3.8 10*3/uL — ABNORMAL HIGH (ref 1.0–3.6)
MCH: 32.5 pg (ref 26.0–34.0)
MCHC: 33.7 g/dL (ref 32.0–36.0)
MCV: 96.6 fL (ref 80.0–100.0)
MONOS PCT: 4 %
Monocytes Absolute: 0.4 10*3/uL (ref 0.2–0.9)
NEUTROS ABS: 4.6 10*3/uL (ref 1.4–6.5)
NEUTROS PCT: 50 %
PLATELETS: 229 10*3/uL (ref 150–440)
RBC: 4.33 MIL/uL (ref 3.80–5.20)
RDW: 13.5 % (ref 11.5–14.5)
WBC: 9.2 10*3/uL (ref 3.6–11.0)

## 2017-12-12 LAB — COMPREHENSIVE METABOLIC PANEL
ALBUMIN: 3.6 g/dL (ref 3.5–5.0)
ALT: 19 U/L (ref 14–54)
AST: 23 U/L (ref 15–41)
Alkaline Phosphatase: 110 U/L (ref 38–126)
Anion gap: 8 (ref 5–15)
BUN: 10 mg/dL (ref 6–20)
CHLORIDE: 102 mmol/L (ref 101–111)
CO2: 26 mmol/L (ref 22–32)
CREATININE: 0.45 mg/dL (ref 0.44–1.00)
Calcium: 9.4 mg/dL (ref 8.9–10.3)
GFR calc Af Amer: >60 mL/min (ref >60)
GFR calc non Af Amer: >60 mL/min (ref >60)
GLUCOSE: 227 mg/dL — AB (ref 65–99)
POTASSIUM: 4.2 mmol/L (ref 3.5–5.1)
Sodium: 136 mmol/L (ref 135–145)
Total Bilirubin: 0.4 mg/dL (ref 0.3–1.2)
Total Protein: 7.5 g/dL (ref 6.5–8.1)

## 2017-12-12 LAB — PROTIME-INR
INR: 0.92
PROTHROMBIN TIME: 12.3 s (ref 11.4–15.2)

## 2017-12-12 LAB — ACETAMINOPHEN LEVEL

## 2017-12-12 MED ORDER — PHENAZOPYRIDINE HCL 200 MG PO TABS
200.0000 mg | ORAL_TABLET | Freq: Once | ORAL | Status: AC
  Administered 2017-12-12: 200 mg via ORAL
  Filled 2017-12-12: qty 1

## 2017-12-12 MED ORDER — SODIUM CHLORIDE 0.9 % IV BOLUS
1000.0000 mL | Freq: Once | INTRAVENOUS | Status: AC
Start: 2017-12-12 — End: 2017-12-12
  Administered 2017-12-12: 1000 mL via INTRAVENOUS

## 2017-12-12 MED ORDER — CEPHALEXIN 500 MG PO CAPS: 500.0000 mg | ORAL_CAPSULE | Freq: Four times a day (QID) | ORAL | 0 refills | Status: AC

## 2017-12-12 MED ORDER — CEPHALEXIN 500 MG PO CAPS: 500.0000 mg | ORAL_CAPSULE | Freq: Four times a day (QID) | ORAL | 0 refills | Status: DC

## 2017-12-12 MED ORDER — PHENAZOPYRIDINE HCL 200 MG PO TABS: 200.0000 mg | ORAL_TABLET | Freq: Three times a day (TID) | ORAL | 0 refills | Status: DC | PRN

## 2017-12-12 MED ORDER — FENTANYL CITRATE (PF) 100 MCG/2ML IJ SOLN
50.0000 ug | Freq: Once | INTRAMUSCULAR | Status: AC
  Administered 2017-12-12: 50 ug via INTRAVENOUS
  Filled 2017-12-12: qty 2

## 2017-12-12 MED ORDER — CEFTRIAXONE SODIUM 1 G IJ SOLR
1.0000 g | Freq: Once | INTRAMUSCULAR | Status: AC
  Administered 2017-12-12: 1 g via INTRAVENOUS
  Filled 2017-12-12: qty 10

## 2017-12-12 NOTE — Discharge Instructions (Addendum)
Please take your antibiotics as prescribed and follow-up with your primary care physician as needed.  Return to the emergency department for any new or worsening symptoms such as fevers, chills, worsening pain, or for any other issues whatsoever.  The maximum dose of Tylenol that you can take day is 4000 mg.  Please be extremely careful in the future.  It was a pleasure to take care of you today, and thank you for coming to our emergency department.  If you have any questions or concerns before leaving please ask the nurse to grab me and I'm more than happy to go through your aftercare instructions again.  If you were prescribed any opioid pain medication today such as Norco, Vicodin, Percocet, morphine, hydrocodone, or oxycodone please make sure you do not drive when you are taking this medication as it can alter your ability to drive safely.  If you have any concerns once you are home that you are not improving or are in fact getting worse before you can make it to your follow-up appointment, please do not hesitate to call 911 and come back for further evaluation.  Darel Hong, MD  Results for orders placed or performed during the hospital encounter of 12/12/17  Urinalysis, Complete w Microscopic  Result Value Ref Range   Color, Urine COLORLESS (A) YELLOW   APPearance CLEAR (A) CLEAR   Specific Gravity, Urine 1.004 (L) 1.005 - 1.030   pH 7.0 5.0 - 8.0   Glucose, UA NEGATIVE NEGATIVE mg/dL   Hgb urine dipstick SMALL (A) NEGATIVE   Bilirubin Urine NEGATIVE NEGATIVE   Ketones, ur NEGATIVE NEGATIVE mg/dL   Protein, ur NEGATIVE NEGATIVE mg/dL   Nitrite NEGATIVE NEGATIVE   Leukocytes, UA SMALL (A) NEGATIVE   RBC / HPF 0-5 0 - 5 RBC/hpf   WBC, UA 11-20 0 - 5 WBC/hpf   Bacteria, UA RARE (A) NONE SEEN   Squamous Epithelial / LPF 0-5 0 - 5   Mucus PRESENT   Acetaminophen level  Result Value Ref Range   Acetaminophen (Tylenol), Serum <10 (L) 10 - 30 ug/mL  Comprehensive metabolic panel    Result Value Ref Range   Sodium 136 135 - 145 mmol/L   Potassium 4.2 3.5 - 5.1 mmol/L   Chloride 102 101 - 111 mmol/L   CO2 26 22 - 32 mmol/L   Glucose, Bld 227 (H) 65 - 99 mg/dL   BUN 10 6 - 20 mg/dL   Creatinine, Ser 0.45 0.44 - 1.00 mg/dL   Calcium 9.4 8.9 - 10.3 mg/dL   Total Protein 7.5 6.5 - 8.1 g/dL   Albumin 3.6 3.5 - 5.0 g/dL   AST 23 15 - 41 U/L   ALT 19 14 - 54 U/L   Alkaline Phosphatase 110 38 - 126 U/L   Total Bilirubin 0.4 0.3 - 1.2 mg/dL   GFR calc non Af Amer >60 >60 mL/min   GFR calc Af Amer >60 >60 mL/min   Anion gap 8 5 - 15  CBC with Differential  Result Value Ref Range   WBC 9.2 3.6 - 11.0 K/uL   RBC 4.33 3.80 - 5.20 MIL/uL   Hemoglobin 14.1 12.0 - 16.0 g/dL   HCT 41.9 35.0 - 47.0 %   MCV 96.6 80.0 - 100.0 fL   MCH 32.5 26.0 - 34.0 pg   MCHC 33.7 32.0 - 36.0 g/dL   RDW 13.5 11.5 - 14.5 %   Platelets 229 150 - 440 K/uL   Neutrophils Relative %  50 %   Neutro Abs 4.6 1.4 - 6.5 K/uL   Lymphocytes Relative 42 %   Lymphs Abs 3.8 (H) 1.0 - 3.6 K/uL   Monocytes Relative 4 %   Monocytes Absolute 0.4 0.2 - 0.9 K/uL   Eosinophils Relative 3 %   Eosinophils Absolute 0.3 0 - 0.7 K/uL   Basophils Relative 1 %   Basophils Absolute 0.1 0 - 0.1 K/uL  Protime-INR  Result Value Ref Range   Prothrombin Time 12.3 11.4 - 15.2 seconds   INR 0.92

## 2017-12-12 NOTE — ED Provider Notes (Signed)
[facility] Emergency Department Provider Note  ____________________________________________   First MD Initiated Contact with Patient 12/12/17 [phone]     (approximate)  I have reviewed the triage vital signs and the nursing notes.   HISTORY  Chief Complaint Urinary Tract Infection   HPI Renee Mack is a 52 y.o. female who self presents to the emergency department with roughly 2 to 3 days of dysuria that has progressed to bilateral back pain.  She has urinary frequency and hesitancy.  She has history of frequent urinary tract infections.  She denies fevers or chills.  She does report some mild cramping lower abdominal discomfort.  Her pain is worse when urinating improved when not.  It is currently moderate in severity.  Her pain is nonradiating.   She denies a history of resistant bacteria.  She has been taking 2 tablets of 650 mg Tylenol "every 4 hours" for the past 24 hours or so with minimal relief in her pain.   Past Medical History:  Diagnosis Date  . Cancer ([place])    vulvular  . COPD (chronic obstructive pulmonary disease) ([place])   . Diabetes mellitus without complication ([place])   . GERD (gastroesophageal reflux disease)   . HLD (hyperlipidemia)   . Pancreatitis   . Shingles     Patient Active Problem List   Diagnosis Date Noted  . Protein-calorie malnutrition, severe 07/18/2017  . DKA (diabetic ketoacidoses) ([place]) 07/17/2017  . Acute ischemic enteritis ([place]) 06/12/2017  . Pyelonephritis 12/28/2014  . Type 2 diabetes mellitus ([place]) 12/28/2014  . COPD (chronic obstructive pulmonary disease) ([place]) 12/28/2014  . GERD (gastroesophageal reflux disease) 12/28/2014  . History of shingles 12/28/2014  . HLD (hyperlipidemia) 12/28/2014    Past Surgical History:  Procedure Laterality Date  . ABDOMINAL HYSTERECTOMY     partial  . ECTOPIC PREGNANCY SURGERY Left   . LYMPHADENECTOMY    . SPLENECTOMY, PARTIAL    . vulvulasectomy      Prior to  Admission medications   Medication Sig Start Date End Date Taking? Authorizing Provider  acetaminophen (TYLENOL) 325 MG tablet Take 650 mg by mouth every 6 (six) hours as needed for mild pain.     [provider]  albuterol (PROAIR HFA) 108 (90 Base) MCG/ACT inhaler Inhale 1 puff into the lungs every 6 (six) hours as needed for wheezing or shortness of breath.     [provider]  aspirin 81 MG tablet Take 81 mg by mouth daily.    [provider]  cephALEXin (KEFLEX) 500 MG capsule Take 1 capsule (500 mg total) by mouth 4 (four) times daily for 10 days. 12/12/17 12/22/17  Darel Hong, MD  citalopram (CELEXA) 20 MG tablet Take 20 mg daily by mouth.    [provider]  esomeprazole (NEXIUM) 40 MG capsule Take 40 mg by mouth daily at 12 noon.    [provider]  HYDROcodone-acetaminophen (NORCO/VICODIN) 5-325 MG tablet Take 1 tablet by mouth every 4 (four) hours as needed. 10/27/17   Harvest Dark, MD  insulin detemir (LEVEMIR) 100 UNIT/ML injection Take 33 units subcutaneously in the morning and 23 units at bedtime 11/04/17   Fritzi Mandes, MD  insulin lispro (HUMALOG) 100 UNIT/ML injection Take per sliding scale with meals:  For blood sugars  <150- no coverage 151-200- 2 units 201-250- 4 units 251-300- 6 units 301-350- 8 units and >351 call MD 11/04/17   Fritzi Mandes, MD  LORazepam (ATIVAN) 0.5 MG tablet Take 1 tablet (0.5 mg  total) by mouth every 8 (eight) hours as needed for anxiety. 02/06/17 02/06/18  Lisa Roca, MD  ondansetron (ZOFRAN ODT) 4 MG disintegrating tablet Take 1 tablet (4 mg total) by mouth every 8 (eight) hours as needed for nausea or vomiting. 09/01/17   Alfred Levins, [place], MD  phenazopyridine (PYRIDIUM) 200 MG tablet Take 1 tablet (200 mg total) by mouth 3 (three) times daily as needed for pain. 12/12/17 12/12/18  Darel Hong, MD  protein supplement shake (PREMIER PROTEIN) LIQD Take 325 mLs (11 oz total) by mouth 2 (two) times daily  between meals. 11/04/17   Fritzi Mandes, MD  sucralfate (CARAFATE) 1 g tablet Take 1 tablet (1 g total) by mouth 4 (four) times daily. 08/01/17 08/01/18  Earleen Newport, MD    Allergies Bee venom; Metformin and related; Darvon [propoxyphene]; Gabapentin; Nsaids; and Tramadol  Family History  Problem Relation Age of Onset  . Cancer Mother   . Osteoporosis Sister   . Heart disease Brother   . Heart attack Father     Social History Social History   Tobacco Use  . Smoking status: Current Every Day Smoker    Packs/day: 0.50    Years: 30.00    Pack years: 15.00    Types: Cigarettes  . Smokeless tobacco: Never Used  Substance Use Topics  . Alcohol use: No  . Drug use: No    Review of Systems Constitutional: No fever/chills Eyes: No visual changes. ENT: No sore throat. Cardiovascular: Denies chest pain. Respiratory: Denies shortness of breath. Gastrointestinal: Positive for abdominal pain.  No nausea, no vomiting.  No diarrhea.  No constipation. Genitourinary: Positive for dysuria. Musculoskeletal: Positive for back pain. Skin: Negative for rash. Neurological: Negative for headaches, focal weakness or numbness.   ____________________________________________   PHYSICAL EXAM:  VITAL SIGNS: ED Triage Vitals  Enc Vitals Group     BP 12/11/17 2316 102/70     Pulse Rate 12/11/17 2316 66     Resp --      Temp 12/11/17 2316 97.7 F (36.5 C)     Temp Source 12/11/17 2316 Oral     SpO2 12/11/17 2316 99 %     Weight 12/11/17 2311 130 lb (59 kg)     Height 12/11/17 2311 5\' 1"  (1.549 m)     Head Circumference --      Peak Flow --      Pain Score 12/11/17 2310 9     Pain Loc --      Pain Edu? --      Excl. in [place]? --     Constitutional: Alert and oriented x4 somewhat uncomfortable appearing holding her abdomen although nontoxic Eyes: PERRL EOMI. Head: Atraumatic. Nose: No congestion/rhinnorhea. Mouth/Throat: No trismus Neck: No stridor.   Cardiovascular: Normal  rate, regular rhythm. Grossly normal heart sounds.  Good peripheral circulation. Respiratory: Normal respiratory effort.  No retractions. Lungs CTAB and moving good air Gastrointestinal: Soft nontender left greater than right mild costovertebral tenderness Musculoskeletal: No lower extremity edema   Neurologic:  Normal speech and language. No gross focal neurologic deficits are appreciated. Skin:  Skin is warm, dry and intact. No rash noted. Psychiatric: Mood and affect are normal. Speech and behavior are normal.    ____________________________________________   DIFFERENTIAL includes but not limited to  Pyelonephritis, urinary tract infection, appendicitis, diverticulitis, pancreatitis ____________________________________________   LABS (all labs ordered are listed, but only abnormal results are displayed)  Labs Reviewed  URINALYSIS, COMPLETE (UACMP) WITH MICROSCOPIC - Abnormal; Notable for  the following components:      Result Value   Color, Urine COLORLESS (*)    APPearance CLEAR (*)    Specific Gravity, Urine 1.004 (*)    Hgb urine dipstick SMALL (*)    Leukocytes, UA SMALL (*)    Bacteria, UA RARE (*)    All other components within normal limits  ACETAMINOPHEN LEVEL - Abnormal; Notable for the following components:   Acetaminophen (Tylenol), Serum <10 (*)    All other components within normal limits  COMPREHENSIVE METABOLIC PANEL - Abnormal; Notable for the following components:   Glucose, Bld 227 (*)    All other components within normal limits  CBC WITH DIFFERENTIAL/PLATELET - Abnormal; Notable for the following components:   Lymphs Abs 3.8 (*)    All other components within normal limits  URINE CULTURE  PROTIME-INR    Lab work reviewed by me negative for acetaminophen.  Urinalysis is concerning for  infection __________________________________________  EKG   ____________________________________________  RADIOLOGY   ____________________________________________   PROCEDURES  Procedure(s) performed: no  Procedures  Critical Care performed: no  ____________________________________________   INITIAL IMPRESSION / ASSESSMENT AND PLAN / ED COURSE  Pertinent labs & imaging results that were available during my care of the patient were reviewed by me and considered in my medical decision making (see chart for details).       The patient arrives uncomfortable appearing with lower urinary symptoms and some left-sided costovertebral tenderness.  She is in significant discomfort and reports taking somewhere around 7 to 8000 mg of acetaminophen in the past 24 hours.  We will give IV fentanyl along with Pyridium now for the discomfort along with a gram of ceftriaxone.  On chart review I cannot find any resistant urine cultures.  ----------------------------------------- 4:02 AM on 12/12/2017 -----------------------------------------  Fortunately the patient's lab work is reassuring and she has no evidence of acetaminophen toxicity.  She will be discharged home with Keflex for 10 days.  Strict return precautions have been given to the patient verbalizes understanding and agree with the plan. ____________________________________________   FINAL CLINICAL IMPRESSION(S) / ED DIAGNOSES  Final diagnoses:  Pyelonephritis      NEW MEDICATIONS STARTED DURING THIS VISIT:  New Prescriptions   CEPHALEXIN (KEFLEX) 500 MG CAPSULE    Take 1 capsule (500 mg total) by mouth 4 (four) times daily for 10 days.   PHENAZOPYRIDINE (PYRIDIUM) 200 MG TABLET    Take 1 tablet (200 mg total) by mouth 3 (three) times daily as needed for pain.     Note:  This document was prepared using Dragon voice recognition software and may include unintentional dictation errors.     Darel Hong,  MD 12/12/17 [phone]

## 2017-12-12 NOTE — ED Notes (Signed)
Pt reports started yesterday with urinary frequency and burning with urination. Pt reports up all night with urinary frequency. Pt reports low midline abd pain and pain across here kidney region.

## 2017-12-13 LAB — URINE CULTURE

## 2017-12-19 ENCOUNTER — Encounter
Admission: EM | Disposition: A | Discharge status: Home / Self Care | Payer: Self-pay | Source: Home / Self Care | Attending: Specialist

## 2017-12-19 ENCOUNTER — Inpatient Hospital Stay
Admission: EM | Admit: 2017-12-19 | Discharge: 2017-12-21 | DRG: 246 | Disposition: A | Discharge status: Home / Self Care | Payer: Medicaid Other | Attending: Specialist | Admitting: Specialist

## 2017-12-19 ENCOUNTER — Emergency Department: Payer: Medicaid Other

## 2017-12-19 ENCOUNTER — Other Ambulatory Visit: Payer: Self-pay

## 2017-12-19 ENCOUNTER — Inpatient Hospital Stay (HOSPITAL_COMMUNITY)
Admit: 2017-12-19 | Discharge: 2017-12-19 | Disposition: A | Discharge status: Home / Self Care | Payer: Medicaid Other | Attending: Cardiovascular Disease | Admitting: Cardiovascular Disease

## 2017-12-19 DIAGNOSIS — F17210 Nicotine dependence, cigarettes, uncomplicated: Secondary | ICD-10-CM | POA: Diagnosis present

## 2017-12-19 DIAGNOSIS — I2102 ST elevation (STEMI) myocardial infarction involving left anterior descending coronary artery: Secondary | ICD-10-CM | POA: Diagnosis present

## 2017-12-19 DIAGNOSIS — Z79899 Other long term (current) drug therapy: Secondary | ICD-10-CM

## 2017-12-19 DIAGNOSIS — I2109 ST elevation (STEMI) myocardial infarction involving other coronary artery of anterior wall: Secondary | ICD-10-CM | POA: Diagnosis present

## 2017-12-19 DIAGNOSIS — I110 Hypertensive heart disease with heart failure: Secondary | ICD-10-CM | POA: Diagnosis present

## 2017-12-19 DIAGNOSIS — Z7982 Long term (current) use of aspirin: Secondary | ICD-10-CM | POA: Diagnosis not present

## 2017-12-19 DIAGNOSIS — F329 Major depressive disorder, single episode, unspecified: Secondary | ICD-10-CM | POA: Diagnosis present

## 2017-12-19 DIAGNOSIS — Z885 Allergy status to narcotic agent status: Secondary | ICD-10-CM | POA: Diagnosis not present

## 2017-12-19 DIAGNOSIS — I255 Ischemic cardiomyopathy: Secondary | ICD-10-CM

## 2017-12-19 DIAGNOSIS — Z886 Allergy status to analgesic agent status: Secondary | ICD-10-CM | POA: Diagnosis not present

## 2017-12-19 DIAGNOSIS — I5021 Acute systolic (congestive) heart failure: Secondary | ICD-10-CM | POA: Diagnosis present

## 2017-12-19 DIAGNOSIS — J449 Chronic obstructive pulmonary disease, unspecified: Secondary | ICD-10-CM | POA: Diagnosis present

## 2017-12-19 DIAGNOSIS — Z9119 Patient's noncompliance with other medical treatment and regimen: Secondary | ICD-10-CM | POA: Diagnosis not present

## 2017-12-19 DIAGNOSIS — E785 Hyperlipidemia, unspecified: Secondary | ICD-10-CM | POA: Diagnosis present

## 2017-12-19 DIAGNOSIS — I25119 Atherosclerotic heart disease of native coronary artery with unspecified angina pectoris: Secondary | ICD-10-CM | POA: Diagnosis present

## 2017-12-19 DIAGNOSIS — Z794 Long term (current) use of insulin: Secondary | ICD-10-CM

## 2017-12-19 DIAGNOSIS — E118 Type 2 diabetes mellitus with unspecified complications: Secondary | ICD-10-CM

## 2017-12-19 DIAGNOSIS — Z87891 Personal history of nicotine dependence: Secondary | ICD-10-CM

## 2017-12-19 DIAGNOSIS — Z8249 Family history of ischemic heart disease and other diseases of the circulatory system: Secondary | ICD-10-CM | POA: Diagnosis not present

## 2017-12-19 DIAGNOSIS — I2510 Atherosclerotic heart disease of native coronary artery without angina pectoris: Secondary | ICD-10-CM | POA: Diagnosis not present

## 2017-12-19 DIAGNOSIS — Z91030 Bee allergy status: Secondary | ICD-10-CM | POA: Diagnosis not present

## 2017-12-19 DIAGNOSIS — R570 Cardiogenic shock: Secondary | ICD-10-CM | POA: Diagnosis present

## 2017-12-19 DIAGNOSIS — I5030 Unspecified diastolic (congestive) heart failure: Secondary | ICD-10-CM

## 2017-12-19 DIAGNOSIS — Z8544 Personal history of malignant neoplasm of other female genital organs: Secondary | ICD-10-CM | POA: Diagnosis not present

## 2017-12-19 DIAGNOSIS — E1165 Type 2 diabetes mellitus with hyperglycemia: Secondary | ICD-10-CM | POA: Diagnosis present

## 2017-12-19 DIAGNOSIS — Z888 Allergy status to other drugs, medicaments and biological substances status: Secondary | ICD-10-CM | POA: Diagnosis not present

## 2017-12-19 DIAGNOSIS — Z720 Tobacco use: Secondary | ICD-10-CM | POA: Diagnosis not present

## 2017-12-19 HISTORY — PX: CORONARY/GRAFT ACUTE MI REVASCULARIZATION: CATH118305

## 2017-12-19 HISTORY — PX: LEFT HEART CATH AND CORONARY ANGIOGRAPHY: CATH118249

## 2017-12-19 LAB — BASIC METABOLIC PANEL
ANION GAP: 12 (ref 5–15)
BUN: 14 mg/dL (ref 6–20)
CALCIUM: 9.4 mg/dL (ref 8.9–10.3)
CHLORIDE: 98 mmol/L (ref 98–111)
CO2: 23 mmol/L (ref 22–32)
Creatinine, Ser: 0.54 mg/dL (ref 0.44–1.00)
GFR calc non Af Amer: >60 mL/min (ref >60)
Glucose, Bld: 410 mg/dL — ABNORMAL HIGH (ref 70–99)
Potassium: 4.1 mmol/L (ref 3.5–5.1)
SODIUM: 133 mmol/L — AB (ref 135–145)

## 2017-12-19 LAB — TROPONIN I
TROPONIN I: 1.29 ng/mL — AB (ref <0.03)
TROPONIN I: 3.23 ng/mL — AB (ref <0.03)
TROPONIN I: 2.64 ng/mL — AB (ref <0.03)
TROPONIN I: 2.41 ng/mL — AB (ref <0.03)
Troponin I: 3.38 ng/mL (ref <0.03)

## 2017-12-19 LAB — CBC
HCT: 48.1 % — ABNORMAL HIGH (ref 35.0–47.0)
HCT: 43 % (ref 35.0–47.0)
HEMOGLOBIN: 16.1 g/dL — AB (ref 12.0–16.0)
Hemoglobin: 14.3 g/dL (ref 12.0–16.0)
MCH: 32.3 pg (ref 26.0–34.0)
MCH: 31.5 pg (ref 26.0–34.0)
MCHC: 33.5 g/dL (ref 32.0–36.0)
MCHC: 33.2 g/dL (ref 32.0–36.0)
MCV: 96.2 fL (ref 80.0–100.0)
MCV: 95 fL (ref 80.0–100.0)
PLATELETS: 312 10*3/uL (ref 150–440)
PLATELETS: 317 10*3/uL (ref 150–440)
RBC: 5 MIL/uL (ref 3.80–5.20)
RBC: 4.52 MIL/uL (ref 3.80–5.20)
RDW: 13.9 % (ref 11.5–14.5)
RDW: 13.7 % (ref 11.5–14.5)
WBC: 10.8 10*3/uL (ref 3.6–11.0)
WBC: 12.7 10*3/uL — AB (ref 3.6–11.0)

## 2017-12-19 LAB — GLUCOSE, CAPILLARY
Glucose-Capillary: 334 mg/dL — ABNORMAL HIGH (ref 70–99)
Glucose-Capillary: 324 mg/dL — ABNORMAL HIGH (ref 70–99)
Glucose-Capillary: 191 mg/dL — ABNORMAL HIGH (ref 70–99)
Glucose-Capillary: 190 mg/dL — ABNORMAL HIGH (ref 70–99)

## 2017-12-19 LAB — URINALYSIS, COMPLETE (UACMP) WITH MICROSCOPIC
Bilirubin Urine: NEGATIVE
Hgb urine dipstick: NEGATIVE
Ketones, ur: 20 mg/dL — AB
Nitrite: NEGATIVE
PROTEIN: NEGATIVE mg/dL
Specific Gravity, Urine: 1.036 — ABNORMAL HIGH (ref 1.005–1.030)
pH: 6 (ref 5.0–8.0)

## 2017-12-19 LAB — LIPID PANEL
Cholesterol: 213 mg/dL — ABNORMAL HIGH (ref 0–200)
HDL: 33 mg/dL — ABNORMAL LOW (ref >40)
LDL CALC: 143 mg/dL — AB (ref 0–99)
Total CHOL/HDL Ratio: 6.5 RATIO
Triglycerides: 185 mg/dL — ABNORMAL HIGH (ref <150)
VLDL: 37 mg/dL (ref 0–40)

## 2017-12-19 LAB — ECHOCARDIOGRAM COMPLETE
HEIGHTINCHES: 61 in
Weight: 2144.63 oz

## 2017-12-19 LAB — CREATININE, SERUM: Creatinine, Ser: 0.51 mg/dL (ref 0.44–1.00)

## 2017-12-19 LAB — PROTIME-INR
INR: 0.97
PROTHROMBIN TIME: 12.8 s (ref 11.4–15.2)

## 2017-12-19 LAB — HEMOGLOBIN A1C
Hgb A1c MFr Bld: 12.1 % — ABNORMAL HIGH (ref 4.8–5.6)
MEAN PLASMA GLUCOSE: 300.57 mg/dL

## 2017-12-19 LAB — POCT ACTIVATED CLOTTING TIME: Activated Clotting Time: 257 seconds

## 2017-12-19 LAB — MRSA PCR SCREENING: MRSA BY PCR: NEGATIVE

## 2017-12-19 LAB — APTT: aPTT: 132 seconds — ABNORMAL HIGH (ref 24–36)

## 2017-12-19 SURGERY — CORONARY/GRAFT ACUTE MI REVASCULARIZATION
Anesthesia: Moderate Sedation

## 2017-12-19 MED ORDER — ACETAMINOPHEN 325 MG PO TABS
650.0000 mg | ORAL_TABLET | Freq: Four times a day (QID) | ORAL | Status: DC | PRN
  Administered 2017-12-19: 650 mg via ORAL
  Filled 2017-12-19: qty 2

## 2017-12-19 MED ORDER — NICOTINE 14 MG/24HR TD PT24
14.0000 mg | MEDICATED_PATCH | Freq: Every day | TRANSDERMAL | Status: DC
  Filled 2017-12-19 (×2): qty 1

## 2017-12-19 MED ORDER — ONDANSETRON HCL 4 MG/2ML IJ SOLN: 4.0000 mg | Freq: Four times a day (QID) | INTRAMUSCULAR | Status: DC | PRN

## 2017-12-19 MED ORDER — IPRATROPIUM-ALBUTEROL 0.5-2.5 (3) MG/3ML IN SOLN: 3.0000 mL | Freq: Four times a day (QID) | RESPIRATORY_TRACT | Status: DC | PRN

## 2017-12-19 MED ORDER — NOREPINEPHRINE 4 MG/250ML-% IV SOLN: 0.0000 ug/min | INTRAVENOUS | Status: DC

## 2017-12-19 MED ORDER — OXYCODONE-ACETAMINOPHEN 5-325 MG PO TABS
2.0000 | ORAL_TABLET | ORAL | Status: DC | PRN
  Administered 2017-12-19 – 2017-12-21 (×11): 2 via ORAL
  Filled 2017-12-19 (×10): qty 2

## 2017-12-19 MED ORDER — LORAZEPAM 1 MG PO TABS
1.0000 mg | ORAL_TABLET | Freq: Three times a day (TID) | ORAL | Status: DC
  Administered 2017-12-19 – 2017-12-21 (×7): 1 mg via ORAL
  Filled 2017-12-19 (×6): qty 1

## 2017-12-19 MED ORDER — NOREPINEPHRINE BITARTRATE 1 MG/ML IV SOLN
INTRAVENOUS | Status: AC | PRN
  Administered 2017-12-19: 5 ug/kg/min via INTRAVENOUS

## 2017-12-19 MED ORDER — ASPIRIN EC 81 MG PO TBEC
81.0000 mg | DELAYED_RELEASE_TABLET | Freq: Every day | ORAL | Status: DC
  Administered 2017-12-19 – 2017-12-21 (×3): 81 mg via ORAL
  Filled 2017-12-19 (×5): qty 1

## 2017-12-19 MED ORDER — FENTANYL CITRATE (PF) 100 MCG/2ML IJ SOLN
INTRAMUSCULAR | Status: AC
  Filled 2017-12-19: qty 2

## 2017-12-19 MED ORDER — VERAPAMIL HCL 2.5 MG/ML IV SOLN
INTRAVENOUS | Status: DC | PRN
  Administered 2017-12-19: 2.5 mg via INTRA_ARTERIAL

## 2017-12-19 MED ORDER — ASPIRIN 81 MG PO TABS: 81.0000 mg | ORAL_TABLET | Freq: Every day | ORAL | Status: DC

## 2017-12-19 MED ORDER — TIROFIBAN HCL IN NACL 5-0.9 MG/100ML-% IV SOLN
0.1500 ug/kg/min | INTRAVENOUS | Status: AC
  Filled 2017-12-19: qty 100

## 2017-12-19 MED ORDER — ATORVASTATIN CALCIUM 20 MG PO TABS
80.0000 mg | ORAL_TABLET | Freq: Every day | ORAL | Status: DC
  Administered 2017-12-19 – 2017-12-20 (×2): 80 mg via ORAL
  Filled 2017-12-19 (×2): qty 4

## 2017-12-19 MED ORDER — DIPHENHYDRAMINE HCL 50 MG/ML IJ SOLN
INTRAMUSCULAR | Status: AC
  Filled 2017-12-19: qty 1

## 2017-12-19 MED ORDER — INSULIN GLARGINE 100 UNIT/ML ~~LOC~~ SOLN
30.0000 [IU] | Freq: Every day | SUBCUTANEOUS | Status: DC
  Administered 2017-12-20 – 2017-12-21 (×2): 30 [IU] via SUBCUTANEOUS
  Filled 2017-12-19 (×3): qty 0.3

## 2017-12-19 MED ORDER — SODIUM CHLORIDE 0.9 % IV SOLN: 250.0000 mL | INTRAVENOUS | Status: DC | PRN

## 2017-12-19 MED ORDER — SODIUM CHLORIDE 0.9 % IV SOLN
INTRAVENOUS | Status: AC | PRN
  Administered 2017-12-19: 250 mL/h via INTRAVENOUS

## 2017-12-19 MED ORDER — FENTANYL CITRATE (PF) 100 MCG/2ML IJ SOLN
INTRAMUSCULAR | Status: DC | PRN
  Administered 2017-12-19: 25 ug via INTRAVENOUS

## 2017-12-19 MED ORDER — DIPHENHYDRAMINE HCL 50 MG/ML IJ SOLN
INTRAMUSCULAR | Status: DC | PRN
  Administered 2017-12-19: 25 mg via INTRAVENOUS

## 2017-12-19 MED ORDER — IOPAMIDOL (ISOVUE-300) INJECTION 61%
INTRAVENOUS | Status: DC | PRN
  Administered 2017-12-19: 255 mL via INTRA_ARTERIAL

## 2017-12-19 MED ORDER — SODIUM CHLORIDE 0.9% FLUSH
3.0000 mL | INTRAVENOUS | Status: DC | PRN
  Administered 2017-12-20: 3 mL via INTRAVENOUS
  Filled 2017-12-19: qty 3

## 2017-12-19 MED ORDER — HEPARIN SODIUM (PORCINE) 1000 UNIT/ML IJ SOLN
INTRAMUSCULAR | Status: AC
  Filled 2017-12-19: qty 1

## 2017-12-19 MED ORDER — LIDOCAINE HCL (PF) 1 % IJ SOLN
INTRAMUSCULAR | Status: AC
  Filled 2017-12-19: qty 30

## 2017-12-19 MED ORDER — MORPHINE SULFATE (PF) 2 MG/ML IV SOLN
2.0000 mg | Freq: Once | INTRAVENOUS | Status: AC
  Administered 2017-12-19: 2 mg via INTRAVENOUS
  Filled 2017-12-19: qty 1

## 2017-12-19 MED ORDER — SODIUM CHLORIDE 0.9% FLUSH
3.0000 mL | Freq: Two times a day (BID) | INTRAVENOUS | Status: DC
  Administered 2017-12-19 – 2017-12-21 (×4): 3 mL via INTRAVENOUS

## 2017-12-19 MED ORDER — INSULIN ASPART 100 UNIT/ML ~~LOC~~ SOLN
0.0000 [IU] | Freq: Three times a day (TID) | SUBCUTANEOUS | Status: DC
  Administered 2017-12-19: 7 [IU] via SUBCUTANEOUS
  Administered 2017-12-19: 2 [IU] via SUBCUTANEOUS
  Filled 2017-12-19 (×2): qty 1

## 2017-12-19 MED ORDER — VERAPAMIL HCL 2.5 MG/ML IV SOLN
INTRAVENOUS | Status: AC
  Filled 2017-12-19: qty 2

## 2017-12-19 MED ORDER — TIROFIBAN HCL IV 12.5 MG/250 ML
INTRAVENOUS | Status: AC | PRN
  Administered 2017-12-19: 0.15 ug/kg/min via INTRAVENOUS

## 2017-12-19 MED ORDER — TIROFIBAN (AGGRASTAT) BOLUS VIA INFUSION
INTRAVENOUS | Status: DC | PRN
  Administered 2017-12-19: 1475 ug via INTRAVENOUS

## 2017-12-19 MED ORDER — NITROGLYCERIN 0.4 MG SL SUBL
0.4000 mg | SUBLINGUAL_TABLET | SUBLINGUAL | Status: DC | PRN
  Administered 2017-12-19 (×2): 0.4 mg via SUBLINGUAL
  Filled 2017-12-19: qty 1

## 2017-12-19 MED ORDER — CARVEDILOL 3.125 MG PO TABS
3.1250 mg | ORAL_TABLET | Freq: Two times a day (BID) | ORAL | Status: DC
Start: 2017-12-19 — End: 2017-12-21
  Administered 2017-12-21: 3.125 mg via ORAL
  Filled 2017-12-19 (×2): qty 1

## 2017-12-19 MED ORDER — INSULIN ASPART 100 UNIT/ML ~~LOC~~ SOLN: 0.0000 [IU] | Freq: Every day | SUBCUTANEOUS | Status: DC

## 2017-12-19 MED ORDER — TICAGRELOR 90 MG PO TABS
ORAL_TABLET | ORAL | Status: DC | PRN
  Administered 2017-12-19: 180 mg via ORAL

## 2017-12-19 MED ORDER — CITALOPRAM HYDROBROMIDE 20 MG PO TABS
20.0000 mg | ORAL_TABLET | Freq: Every day | ORAL | Status: DC
  Administered 2017-12-19 – 2017-12-21 (×3): 20 mg via ORAL
  Filled 2017-12-19 (×3): qty 1

## 2017-12-19 MED ORDER — SODIUM CHLORIDE 0.9 % IV SOLN: INTRAVENOUS | Status: AC

## 2017-12-19 MED ORDER — HEPARIN BOLUS VIA INFUSION
3500.0000 [IU] | Freq: Once | INTRAVENOUS | Status: DC
  Filled 2017-12-19: qty 3500

## 2017-12-19 MED ORDER — INSULIN ASPART 100 UNIT/ML ~~LOC~~ SOLN
8.0000 [IU] | Freq: Three times a day (TID) | SUBCUTANEOUS | Status: DC
  Administered 2017-12-19 (×2): 8 [IU] via SUBCUTANEOUS
  Filled 2017-12-19 (×2): qty 1

## 2017-12-19 MED ORDER — TICAGRELOR 90 MG PO TABS
90.0000 mg | ORAL_TABLET | Freq: Two times a day (BID) | ORAL | Status: DC
  Administered 2017-12-19 – 2017-12-21 (×4): 90 mg via ORAL
  Filled 2017-12-19 (×5): qty 1

## 2017-12-19 MED ORDER — HEPARIN (PORCINE) IN NACL 100-0.45 UNIT/ML-% IJ SOLN
700.0000 [IU]/h | INTRAMUSCULAR | Status: DC
  Filled 2017-12-19: qty 250

## 2017-12-19 MED ORDER — NITROGLYCERIN 1 MG/10 ML FOR IR/CATH LAB
INTRA_ARTERIAL | Status: DC | PRN
  Administered 2017-12-19: 200 ug via INTRACORONARY

## 2017-12-19 MED ORDER — ENOXAPARIN SODIUM 40 MG/0.4ML ~~LOC~~ SOLN
40.0000 mg | SUBCUTANEOUS | Status: DC
  Administered 2017-12-20 – 2017-12-21 (×2): 40 mg via SUBCUTANEOUS
  Filled 2017-12-19 (×2): qty 0.4

## 2017-12-19 MED ORDER — LORAZEPAM 0.5 MG PO TABS
0.5000 mg | ORAL_TABLET | Freq: Three times a day (TID) | ORAL | Status: DC
Start: 2017-12-19 — End: 2017-12-19

## 2017-12-19 MED ORDER — HEPARIN SODIUM (PORCINE) 5000 UNIT/ML IJ SOLN
60.0000 [IU]/kg | Freq: Once | INTRAMUSCULAR | Status: AC
  Administered 2017-12-19: 3550 [IU] via INTRAVENOUS
  Filled 2017-12-19: qty 1

## 2017-12-19 MED ORDER — ASPIRIN 81 MG PO CHEW
324.0000 mg | CHEWABLE_TABLET | Freq: Once | ORAL | Status: AC
  Administered 2017-12-19: 324 mg via ORAL
  Filled 2017-12-19: qty 4

## 2017-12-19 MED ORDER — NITROGLYCERIN 5 MG/ML IV SOLN
INTRAVENOUS | Status: AC
Start: 2017-12-19 — End: ?
  Filled 2017-12-19: qty 10

## 2017-12-19 MED ORDER — TICAGRELOR 90 MG PO TABS
ORAL_TABLET | ORAL | Status: AC
  Filled 2017-12-19: qty 2

## 2017-12-19 MED ORDER — INSULIN GLARGINE 100 UNIT/ML ~~LOC~~ SOLN
60.0000 [IU] | Freq: Every day | SUBCUTANEOUS | Status: DC
  Filled 2017-12-19: qty 0.6

## 2017-12-19 MED ORDER — HEPARIN SODIUM (PORCINE) 1000 UNIT/ML IJ SOLN
INTRAMUSCULAR | Status: DC | PRN
  Administered 2017-12-19: 2000 [IU] via INTRAVENOUS
  Administered 2017-12-19: 4000 [IU] via INTRAVENOUS

## 2017-12-19 MED ORDER — NITROGLYCERIN 0.4 MG SL SUBL
SUBLINGUAL_TABLET | SUBLINGUAL | Status: AC
  Administered 2017-12-19: 0.4 mg via SUBLINGUAL
  Filled 2017-12-19: qty 1

## 2017-12-19 SURGICAL SUPPLY — 19 items
BALLN MINITREK RX 2.0X12 (BALLOONS) ×3
BALLN TREK RX 2.5X12 (BALLOONS) ×3
BALLN ~~LOC~~ TREK RX 2.5X20 (BALLOONS) ×3
BALLOON MINITREK RX 2.0X12 (BALLOONS) ×1 IMPLANT
BALLOON TREK RX 2.5X12 (BALLOONS) ×1 IMPLANT
BALLOON ~~LOC~~ TREK RX 2.5X20 (BALLOONS) ×1 IMPLANT
CATH INFINITI JR4 5F (CATHETERS) ×3 IMPLANT
CATH LAUNCHER 6FR EBU 3 (CATHETERS) ×3 IMPLANT
DEVICE INFLAT 30 PLUS (MISCELLANEOUS) ×3 IMPLANT
DEVICE RAD TR BAND REGULAR (VASCULAR PRODUCTS) ×3 IMPLANT
KIT MANI 3VAL PERCEP (MISCELLANEOUS) ×3 IMPLANT
NEEDLE PERC 21GX4CM (NEEDLE) ×6 IMPLANT
PACK CARDIAC CATH (CUSTOM PROCEDURE TRAY) ×3 IMPLANT
SHEATH RAIN RADIAL 21G 6FR (SHEATH) ×3 IMPLANT
STENT RESOLUTE ONYX 2.0X12 (Permanent Stent) ×3 IMPLANT
STENT RESOLUTE ONYX 2.0X15 (Permanent Stent) ×3 IMPLANT
STENT RESOLUTE ONYX 2.25X26 (Permanent Stent) ×3 IMPLANT
WIRE INTUITION PROPEL ST 180CM (WIRE) ×3 IMPLANT
WIRE ROSEN-J .035X260CM (WIRE) ×3 IMPLANT

## 2017-12-19 NOTE — Progress Notes (Signed)
STEMI  3 stents to LAD  Still has 90% RCA stenosis  No acute distress chronic pain  Plan 1.folow up cardiology recs 2.wean off vasopressors 3.restart pain meds    Maretta Bees Patricia Pesa, M.D.  [facility]  Medical Director [place] Director [facility] Cardio-Pulmonary Department

## 2017-12-19 NOTE — Progress Notes (Signed)
Inpatient Diabetes Program Recommendations  AACE/ADA: New Consensus Statement on Inpatient Glycemic Control (2015)  Target Ranges:  Prepandial:   less than 140 mg/dL      Peak postprandial:   less than 180 mg/dL (1-2 hours)      Critically ill patients:  140 - 180 mg/dL   Lab Results  Component Value Date   GLUCAP 334 (H) 12/19/2017   HGBA1C 15.8 (H) 06/12/2017    Review of Glycemic ControlResults for Renee Mack, Renee Mack (MRN [id number]) as of 12/19/2017 10:48  Ref. Range 12/19/2017 08:48  Glucose-Capillary Latest Ref Range: 70 - 99 mg/dL 334 (H)    Diabetes history: Type 2 DM  Outpatient Diabetes medications: Lantus 60 units q HS Current orders for Inpatient glycemic control:  Lantus 60 units q HS, Novolog sensitive tid with meals and HS, Novolog 8 units tid with meals Inpatient Diabetes Program Recommendations:   Note elevated CBG upon admit.  Discussed with RN-  If patient did not take Lantus last PM, may need to start Lantus this morning.  Also based on patient's weight, consider reduction in Lantus to 30 units daily.  Will text page MD.  A1C pending.   Thanks,  Adah Perl, RN, BC-ADM Inpatient Diabetes Coordinator Pager [phone] (8a-5p)

## 2017-12-19 NOTE — ED Triage Notes (Signed)
Patient with onset of chest pain yesterday but this morning around 0330 she was awakened by worsening of the chest pain and shortness of breath as well as diaphoresis.

## 2017-12-19 NOTE — Progress Notes (Signed)
*  PRELIMINARY RESULTS* Echocardiogram 2D Echocardiogram has been performed.  Sherrie Sport 12/19/2017, 3:53 PM

## 2017-12-19 NOTE — ED Notes (Signed)
Dr Fletcher Anon to bedside and spoke with pt.

## 2017-12-19 NOTE — Progress Notes (Signed)
[place] for Aggrastat Indication: STEMI s/p PCI  Allergies  Allergen Reactions  . Bee Venom Itching, Shortness Of Breath and Swelling  . Metformin And Related Shortness Of Breath  . Darvon [Propoxyphene] Itching  . Gabapentin Swelling  . Nsaids Other (See Comments)    Ulcers   . Tramadol Hives    Patient Measurements: Height: 5\' 1"  (154.9 cm) Weight: 134 lb 0.6 oz (60.8 kg) IBW/kg (Calculated) : 47.8   Vital Signs: Temp: 97.6 F (36.4 C) (07/01 0843) Temp Source: Oral (07/01 0843) BP: 90/40 (07/01 0843) Pulse Rate: 78 (07/01 0642)  Labs: Recent Labs    12/19/17 0626  HGB 16.1*  HCT 48.1*  PLT 312  CREATININE 0.54  TROPONINI 1.29*    Estimated Creatinine Clearance: 67.3 mL/min (by C-G formula based on SCr of 0.54 mg/dL).   Medical History: Past Medical History:  Diagnosis Date  . Cancer ([place])    vulvular  . COPD (chronic obstructive pulmonary disease) ([place])   . Diabetes mellitus without complication ([place])   . GERD (gastroesophageal reflux disease)   . HLD (hyperlipidemia)   . Pancreatitis   . Shingles    Assessment: 52 y/o F admitted with STEMI now s/p stent to LAD on Aggrastat for 12 hours post-cath.   Goal of Therapy:  Monitor platelets by anticoagulation protocol: Yes   Plan:  Aggrastat 0.15 mcg/kg/min x 12 hours.   Ulice Dash D 12/19/2017,9:00 AM

## 2017-12-19 NOTE — H&P (Signed)
[facility] [place] NAME: Renee Mack    MR#:  [id number]  DATE OF BIRTH:  [birth year]  DATE OF ADMISSION:  12/19/2017  PRIMARY CARE PHYSICIAN: Ro, [name], MD   REQUESTING/REFERRING PHYSICIAN: Dr. [name]  CHIEF COMPLAINT:   Chief Complaint  Patient presents with  . Chest Pain    HISTORY OF PRESENT ILLNESS:  Renee Mack  is a 52 y.o. female with a known history of insulin-dependent diabetes mellitus, vulvar cancer status post surgery, [name], hyperlipidemia, ongoing smoking presents to hospital secondary to chest tightness. Patient has been having intermittent chest pain for the last couple of days.  More noticeable yesterday but it was improving with rest.  But around 3 AM this morning she woke up with intense pressure in her chest radiating down her left arm to her back, between the shoulders and neck.  She was sweating profusely and was nauseous.  Presented to the emergency room.  EKG showing subtle ST elevations in anterior leads.  Had cardiac catheterization done showing diffusely stenosed LAD.  She had 3 stents placed.  She is currently in the ICU.  Much improved chest pain.  Remains on Aggrastat drip.  Also requiring low-dose levophed for borderline low blood pressures. Denies any recent travel, no nausea or vomiting, no fevers or chills.  Had a ER visit last week for dysuria and has just finished her Keflex.  PAST MEDICAL HISTORY:   Past Medical History:  Diagnosis Date  . Cancer ([place])    vulvular  . COPD (chronic obstructive pulmonary disease) ([facility])    not on home oxygen  . Diabetes mellitus without complication ([place])   . GERD (gastroesophageal reflux disease)   . HLD (hyperlipidemia)   . Pancreatitis   . Shingles     PAST SURGICAL HISTORY:   Past Surgical History:  Procedure Laterality Date  . ABDOMINAL HYSTERECTOMY     partial  . CORONARY/GRAFT ACUTE MI REVASCULARIZATION N/A 12/19/2017   Procedure: Coronary/Graft Acute MI Revascularization;  Surgeon: Wellington Hampshire, MD;  Location: [facility];  Service: Cardiovascular;  Laterality: N/A;  . ECTOPIC PREGNANCY SURGERY Left   . LEFT HEART CATH AND CORONARY ANGIOGRAPHY N/A 12/19/2017   Procedure: LEFT HEART CATH AND CORONARY ANGIOGRAPHY;  Surgeon: Wellington Hampshire, MD;  Location: [facility];  Service: Cardiovascular;  Laterality: N/A;  . LYMPHADENECTOMY    . SPLENECTOMY, PARTIAL    . vulvulasectomy      SOCIAL HISTORY:   Social History   Tobacco Use  . Smoking status: Current Every Day Smoker    Packs/day: 0.50    Years: 30.00    Pack years: 15.00    Types: Cigarettes  . Smokeless tobacco: Never Used  Substance Use Topics  . Alcohol use: No    FAMILY HISTORY:   Family History  Problem Relation Age of Onset  . Cancer Mother   . Osteoporosis Sister   . Heart disease Brother   . Heart attack Father     DRUG ALLERGIES:   Allergies  Allergen Reactions  . Bee Venom Itching, Shortness Of Breath and Swelling  . Metformin And Related Shortness Of Breath  . Darvon [Propoxyphene] Itching  . Gabapentin Swelling  . Nsaids Other (See Comments)    Ulcers   . Tramadol Hives    REVIEW OF SYSTEMS:   Review of Systems  Constitutional: Positive for diaphoresis. Negative for chills, fever, malaise/fatigue and  weight loss.  HENT: Negative for ear discharge, ear pain, hearing loss, nosebleeds and tinnitus.   Eyes: Negative for blurred vision, double vision and photophobia.  Respiratory: Negative for cough, hemoptysis, shortness of breath and wheezing.   Cardiovascular: Positive for chest pain. Negative for palpitations, orthopnea and leg swelling.  Gastrointestinal: Negative for abdominal pain, constipation, diarrhea, heartburn, melena, nausea and vomiting.  Genitourinary: Positive for dysuria. Negative for frequency and urgency.  Musculoskeletal: Negative for back pain, myalgias and neck pain.    Skin: Negative for rash.  Neurological: Negative for dizziness, tingling, tremors, sensory change, speech change, focal weakness and headaches.  Endo/Heme/Allergies: Does not bruise/bleed easily.  Psychiatric/Behavioral: Negative for depression.    MEDICATIONS AT HOME:   Prior to Admission medications   Medication Sig Start Date End Date Taking? Authorizing Provider  acetaminophen (TYLENOL) 325 MG tablet Take 650 mg by mouth every 6 (six) hours as needed for mild pain.    Yes [provider]  albuterol (PROAIR HFA) 108 (90 Base) MCG/ACT inhaler Inhale 1 puff into the lungs every 6 (six) hours as needed for wheezing or shortness of breath.    Yes [provider]  aspirin 81 MG tablet Take 81 mg by mouth daily.   Yes [provider]  cephALEXin (KEFLEX) 500 MG capsule Take 1 capsule (500 mg total) by mouth 4 (four) times daily for 10 days. 12/12/17 12/22/17 Yes Darel Hong, MD  citalopram (CELEXA) 20 MG tablet Take 20 mg daily by mouth.   Yes [provider]  insulin aspart (NOVOLOG) 100 UNIT/ML injection Inject 10 Units into the skin 3 (three) times daily before meals. Take 10 units PLUS sliding scale   Yes [provider]  insulin glargine (LANTUS) 100 UNIT/ML injection Inject 60 Units into the skin at bedtime.   Yes [provider]  LORazepam (ATIVAN) 1 MG tablet Take 1 mg by mouth every 8 (eight) hours.   Yes [provider]  HYDROcodone-acetaminophen (NORCO/VICODIN) 5-325 MG tablet Take 1 tablet by mouth every 4 (four) hours as needed. Patient not taking: Reported on 12/19/2017 10/27/17   Harvest Dark, MD  insulin detemir (LEVEMIR) 100 UNIT/ML injection Take 33 units subcutaneously in the morning and 23 units at bedtime Patient not taking: Reported on 12/19/2017 11/04/17   Fritzi Mandes, MD  insulin lispro (HUMALOG) 100 UNIT/ML injection Take per sliding scale with meals:  For blood sugars  <150- no coverage 151-200- 2  units 201-250- 4 units 251-300- 6 units 301-350- 8 units and >351 call MD Patient not taking: Reported on 12/19/2017 11/04/17   Fritzi Mandes, MD  LORazepam (ATIVAN) 0.5 MG tablet Take 1 tablet (0.5 mg total) by mouth every 8 (eight) hours as needed for anxiety. Patient not taking: Reported on 12/19/2017 02/06/17 02/06/18  Lisa Roca, MD  ondansetron (ZOFRAN ODT) 4 MG disintegrating tablet Take 1 tablet (4 mg total) by mouth every 8 (eight) hours as needed for nausea or vomiting. Patient not taking: Reported on 12/19/2017 09/01/17   Rudene Re, MD  phenazopyridine (PYRIDIUM) 200 MG tablet Take 1 tablet (200 mg total) by mouth 3 (three) times daily as needed for pain. Patient not taking: Reported on 12/19/2017 12/12/17 12/12/18  Darel Hong, MD  protein supplement shake (PREMIER PROTEIN) LIQD Take 325 mLs (11 oz total) by mouth 2 (two) times daily between meals. Patient not taking: Reported on 12/19/2017 11/04/17   Fritzi Mandes, MD  sucralfate (CARAFATE) 1 g tablet Take 1 tablet (1 g total) by mouth 4 (  four) times daily. Patient not taking: Reported on 12/19/2017 08/01/17 08/01/18  Earleen Newport, MD      VITAL SIGNS:  Blood pressure 108/88, pulse 78, temperature 97.6 F (36.4 C), temperature source Oral, resp. rate (!) 22, height 5\' 1"  (1.549 m), weight 60.8 kg (134 lb 0.6 oz), SpO2 98 %.  PHYSICAL EXAMINATION:   Physical Exam  GENERAL:  52 y.o.-year-old patient lying in the bed with no acute distress.  EYES: Pupils equal, round, reactive to light and accommodation. No scleral icterus. Extraocular muscles intact.  HEENT: Head atraumatic, normocephalic. Oropharynx and nasopharynx clear.  NECK:  Supple, no jugular venous distention. No thyroid enlargement, no tenderness.  LUNGS: Normal breath sounds bilaterally, no wheezing, rales,rhonchi or crepitation. No use of accessory muscles of respiration.  CARDIOVASCULAR: S1, S2 normal. No murmurs, rubs, or gallops.  ABDOMEN: Soft, nontender,  nondistended. Bowel sounds present. No organomegaly or mass.  EXTREMITIES: right radial artery pressure dressing in place. No pedal edema, cyanosis, or clubbing.  NEUROLOGIC: Cranial nerves II through XII are intact. Muscle strength 5/5 in all extremities. Sensation intact. Gait not checked.  PSYCHIATRIC: The patient is alert and oriented x 3.  SKIN: No obvious rash, lesion, or ulcer.   LABORATORY PANEL:   CBC Recent Labs  Lab 12/19/17 0934  WBC 12.7*  HGB 14.3  HCT 43.0  PLT 317   ------------------------------------------------------------------------------------------------------------------  Chemistries  Recent Labs  Lab 12/19/17 0626 12/19/17 0934  NA 133*  --   K 4.1  --   CL 98  --   CO2 23  --   GLUCOSE 410*  --   BUN 14  --   CREATININE 0.54 0.51  CALCIUM 9.4  --    ------------------------------------------------------------------------------------------------------------------  Cardiac Enzymes Recent Labs  Lab 12/19/17 0934  TROPONINI 3.38*   ------------------------------------------------------------------------------------------------------------------  RADIOLOGY:  No results found.  EKG:   Orders placed or performed during the hospital encounter of 12/19/17  . EKG 12-Lead  . EKG 12-Lead  . ED EKG  . ED EKG  . EKG 12-Lead  . EKG 12-Lead  . EKG 12-Lead  . EKG 12-Lead  . EKG 12-Lead immediately post procedure  . EKG 12-Lead  . EKG 12-Lead immediately post procedure    IMPRESSION AND PLAN:   Renee Mack  is a 52 y.o. female with a known history of insulin-dependent diabetes mellitus, vulvar cancer status post surgery, [name], hyperlipidemia, ongoing smoking presents to hospital secondary to chest tightness.  1. STEMI- anterior wall STEMI, that is post cardiac catheterization -Started on aspirin and Brilinta.  Recent drug-eluting stents placed in LAD -Ischemic cardiomyopathy, EF down to 30% based on cardiac  catheterization -Echocardiogram pending -Statin added.  Check lipid panel -Cardiac rehab after discharge -Appreciate cardiology consult. -Not on beta-blocker or ARB yet due to low blood pressures.  Requiring low-dose Levophed at this time.  2.  Uncontrolled diabetes mellitus-has insulin-dependent type 2 diabetes mellitus. -Sugars are elevated on presentation. -Check A1c -Restarted Lantus at bedtime, and pre-meal NovoLog and also sliding scale insulin  3.  Tobacco use disorder-counseled against smoking.  Planning to quit.  Started on nicotine patch in the hospital  4.  COPD-stable at this time.  PRN nebulizers.  No indication for systemic steroids.  5.  DVT prophylaxis-on Lovenox  Monitor in ICU today.   All the records are reviewed and case discussed with ED provider. Management plans discussed with the patient, family and they are in agreement.  CODE STATUS: Full Code  TOTAL TIME TAKING CARE OF THIS PATIENT: 55 minutes.    Gladstone Lighter M.D on 12/19/2017 at 10:33 AM  Between 7am to 6pm - Pager - [phone]  After 6pm go to [email or web address] - password [email or web address] [facility]  Office  [phone]  CC: Primary care physician; Carolyn Stare [name], MD

## 2017-12-19 NOTE — Progress Notes (Signed)
   12/19/17 0612  Clinical Encounter Type  Visited With Patient  Visit Type Initial;Code  Spiritual Encounters  Spiritual Needs Emotional;Prayer   Chaplain responded to code STEMI.  Chaplain offered emotional support and prayer to patient at the bedside.  Patient expressed that it had 'been a difficult year' in response to chaplain query if it had been a difficult morning.  Patient reported multiple health issues and death of spouse.  Patient verbalized that faith in God is essential in her life and she sees God working in her life.  Staff asked chaplain to step out to prepare patient for transition to cath lab.

## 2017-12-19 NOTE — ED Notes (Signed)
Pt placed on Zoll monitor.

## 2017-12-19 NOTE — ED Provider Notes (Signed)
[facility] Emergency Department Provider Note  Time seen 6:00 AM  I have reviewed the triage vital signs and the nursing notes.   HISTORY  Chief Complaint Chest Pain    HPI Renee Mack is a 52 y.o. female with below list of chronic medical conditions presents to the emergency department with intermittent chest pain x1 day however chest pain has become persistent since 3:00 this morning with radiation to the neck and left arm.  Patient also admits to diaphoresis and dyspnea.  Patient denies any lower extremity pain or swelling.   Past Medical History:  Diagnosis Date  . Cancer ([place])    vulvular  . COPD (chronic obstructive pulmonary disease) ([place])   . Diabetes mellitus without complication ([place])   . GERD (gastroesophageal reflux disease)   . HLD (hyperlipidemia)   . Pancreatitis   . Shingles     Patient Active Problem List   Diagnosis Date Noted  . Protein-calorie malnutrition, severe 07/18/2017  . DKA (diabetic ketoacidoses) ([place]) 07/17/2017  . Acute ischemic enteritis ([place]) 06/12/2017  . Pyelonephritis 12/28/2014  . Type 2 diabetes mellitus ([place]) 12/28/2014  . COPD (chronic obstructive pulmonary disease) ([place]) 12/28/2014  . GERD (gastroesophageal reflux disease) 12/28/2014  . History of shingles 12/28/2014  . HLD (hyperlipidemia) 12/28/2014    Past Surgical History:  Procedure Laterality Date  . ABDOMINAL HYSTERECTOMY     partial  . ECTOPIC PREGNANCY SURGERY Left   . LYMPHADENECTOMY    . SPLENECTOMY, PARTIAL    . vulvulasectomy      Prior to Admission medications   Medication Sig Start Date End Date Taking? Authorizing Provider  acetaminophen (TYLENOL) 325 MG tablet Take 650 mg by mouth every 6 (six) hours as needed for mild pain.    Yes [provider]  albuterol (PROAIR HFA) 108 (90 Base) MCG/ACT inhaler Inhale 1 puff into the lungs every 6 (six) hours as needed for wheezing or shortness of breath.    Yes [provider]  aspirin 81 MG tablet Take 81 mg by mouth daily.   Yes [provider]  cephALEXin (KEFLEX) 500 MG capsule Take 1 capsule (500 mg total) by mouth 4 (four) times daily for 10 days. 12/12/17 12/22/17 Yes Darel Hong, MD  citalopram (CELEXA) 20 MG tablet Take 20 mg daily by mouth.   Yes [provider]  insulin aspart (NOVOLOG) 100 UNIT/ML injection Inject 10 Units into the skin 3 (three) times daily before meals. Take 10 units PLUS sliding scale   Yes [provider]  insulin glargine (LANTUS) 100 UNIT/ML injection Inject 60 Units into the skin at bedtime.   Yes [provider]  LORazepam (ATIVAN) 1 MG tablet Take 1 mg by mouth every 8 (eight) hours.   Yes [provider]  HYDROcodone-acetaminophen (NORCO/VICODIN) 5-325 MG tablet Take 1 tablet by mouth every 4 (four) hours as needed. Patient not taking: Reported on 12/19/2017 10/27/17   Harvest Dark, MD  insulin detemir (LEVEMIR) 100 UNIT/ML injection Take 33 units subcutaneously in the morning and 23 units at bedtime Patient not taking: Reported on 12/19/2017 11/04/17   Fritzi Mandes, MD  insulin lispro (HUMALOG) 100 UNIT/ML injection Take per sliding scale with meals:  For blood sugars  <150- no coverage 151-200- 2 units 201-250- 4 units 251-300- 6 units 301-350- 8 units and >351 call MD Patient not taking: Reported on 12/19/2017 11/04/17   Fritzi Mandes, MD  LORazepam (ATIVAN) 0.5 MG tablet Take 1 tablet (0.5 mg total)  by mouth every 8 (eight) hours as needed for anxiety. Patient not taking: Reported on 12/19/2017 02/06/17 02/06/18  Lisa Roca, MD  ondansetron (ZOFRAN ODT) 4 MG disintegrating tablet Take 1 tablet (4 mg total) by mouth every 8 (eight) hours as needed for nausea or vomiting. Patient not taking: Reported on 12/19/2017 09/01/17   Rudene Re, MD  phenazopyridine (PYRIDIUM) 200 MG tablet Take 1 tablet (200 mg total) by mouth 3 (three) times daily as needed for pain. Patient  not taking: Reported on 12/19/2017 12/12/17 12/12/18  Darel Hong, MD  protein supplement shake (PREMIER PROTEIN) LIQD Take 325 mLs (11 oz total) by mouth 2 (two) times daily between meals. Patient not taking: Reported on 12/19/2017 11/04/17   Fritzi Mandes, MD  sucralfate (CARAFATE) 1 g tablet Take 1 tablet (1 g total) by mouth 4 (four) times daily. Patient not taking: Reported on 12/19/2017 08/01/17 08/01/18  Earleen Newport, MD    Allergies Bee venom; Metformin and related; Darvon [propoxyphene]; Gabapentin; Nsaids; and Tramadol  Family History  Problem Relation Age of Onset  . Cancer Mother   . Osteoporosis Sister   . Heart disease Brother   . Heart attack Father     Social History Social History   Tobacco Use  . Smoking status: Current Every Day Smoker    Packs/day: 0.50    Years: 30.00    Pack years: 15.00    Types: Cigarettes  . Smokeless tobacco: Never Used  Substance Use Topics  . Alcohol use: No  . Drug use: No    Review of Systems Constitutional: No fever/chills Eyes: No visual changes. ENT: No sore throat. Cardiovascular: Positive for chest pain. Respiratory: Denies shortness of breath. Gastrointestinal: No abdominal pain.  No nausea, no vomiting.  No diarrhea.  No constipation. Genitourinary: Negative for dysuria. Musculoskeletal: Negative for neck pain.  Negative for back pain. Integumentary: Negative for rash. Neurological: Negative for headaches, focal weakness or numbness.   ____________________________________________   PHYSICAL EXAM:  VITAL SIGNS: ED Triage Vitals  Enc Vitals Group     BP 12/19/17 0610 (!) 180/90     Pulse Rate 12/19/17 0610 83     Resp 12/19/17 0610 18     Temp 12/19/17 0610 98 F (36.7 C)     Temp Source 12/19/17 0610 Oral     SpO2 12/19/17 0610 99 %     Weight --      Height --      Head Circumference --      Peak Flow --      Pain Score 12/19/17 0611 9     Pain Loc --      Pain Edu? --      Excl. in [place]? --      Constitutional: Alert and oriented.  Apparent discomfort  eyes: Conjunctivae are normal. Head: Atraumatic. Mouth/Throat: Mucous membranes are moist.  Oropharynx non-erythematous. Neck: No stridor.   Cardiovascular: Normal rate, regular rhythm. Good peripheral circulation. Grossly normal heart sounds. Respiratory: Normal respiratory effort.  No retractions. Lungs CTAB. Gastrointestinal: Soft and nontender. No distention.  Musculoskeletal: No lower extremity tenderness nor edema. No gross deformities of extremities. Neurologic:  Normal speech and language. No gross focal neurologic deficits are appreciated.  Skin:  Skin is warm, dry and intact. No rash noted. Psychiatric: Mood and affect are normal. Speech and behavior are normal.  ____________________________________________   LABS (all labs ordered are listed, but only abnormal results are displayed)  Labs Reviewed  BASIC METABOLIC PANEL -  Abnormal; Notable for the following components:      Result Value   Sodium 133 (*)    Glucose, Bld 410 (*)    All other components within normal limits  CBC - Abnormal; Notable for the following components:   Hemoglobin 16.1 (*)    HCT 48.1 (*)    All other components within normal limits  TROPONIN I - Abnormal; Notable for the following components:   Troponin I 1.29 (*)    All other components within normal limits  PROTIME-INR  APTT   ____________________________________________  EKG  ED ECG REPORT I, Byron N Kaivon Livesey, the attending physician, personally viewed and interpreted this ECG.   Date: 12/19/2017  EKG Time: 5:57 AM  Rate: 85  Rhythm: Normal sinus rhythm  Axis: Normal  Intervals: Normal  ST&T Change: 2 mm ST elevation in V2 concerning morphology of the ST segment in V3.  T wave inversion in lateral leads.  No results found.   .Critical Care Performed by: Gregor Hams, MD Authorized by: Gregor Hams, MD   Critical care provider statement:    Critical  care time (minutes):  30   Critical care time was exclusive of:  Separately billable procedures and treating other patients (STEMI)   Critical care was necessary to treat or prevent imminent or life-threatening deterioration of the following conditions:  Cardiac failure and circulatory failure   Critical care was time spent personally by me on the following activities:  Development of treatment plan with patient or surrogate, discussions with consultants, evaluation of patient's response to treatment, examination of patient, obtaining history from patient or surrogate, ordering and performing treatments and interventions, ordering and review of laboratory studies, ordering and review of radiographic studies, pulse oximetry, re-evaluation of patient's condition and review of old charts     ____________________________________________   INITIAL IMPRESSION / ASSESSMENT AND PLAN / ED COURSE  As part of my medical decision making, I reviewed the following data within the electronic MEDICAL RECORD NUMBER   52 year old female presented with above-stated history and physical exam concerning for ACS/STEMI.  Given EKG findings code STEMI was activated.  Dr. Tyrell Antonio presented to the emergency department and evaluated EKG made a decision to take the patient to the Cath Lab.  Patient was given aspirin and heparin bolus before Dr. Tyrell Antonio arrival. ____________________________________________  FINAL CLINICAL IMPRESSION(S) / ED DIAGNOSES  Final diagnoses:  Acute ST elevation myocardial infarction (STEMI) of anterior wall ([place])     MEDICATIONS GIVEN DURING THIS VISIT:  Medications  heparin bolus via infusion 3,500 Units ( Intravenous MAR Hold 12/19/17 0648)  heparin ADULT infusion 100 units/mL (25000 units/292mL sodium chloride 0.45%) (has no administration in time range)  verapamil (ISOPTIN) injection (2.5 mg Intra-arterial Given 12/19/17 0703)  heparin injection (4,000 Units Intravenous Given 12/19/17 0707)   norepinephrine (LEVOPHED) 4 mg in dextrose 5 % 250 mL (0.016 mg/mL) infusion (5 mcg/kg/min  59 kg Intravenous New Bag/Given 12/19/17 0718)  0.9 %  sodium chloride infusion (250 mL/hr Intravenous New Bag/Given 12/19/17 0719)  morphine 2 MG/ML injection 2 mg (2 mg Intravenous Given 12/19/17 6213)  aspirin chewable tablet 324 mg (324 mg Oral Given 12/19/17 0627)  heparin injection 3,550 Units (3,550 Units Intravenous Given 12/19/17 0640)     ED Discharge Orders    None       Note:  This document was prepared using Dragon voice recognition software and may include unintentional dictation errors.    Gregor Hams, MD 12/19/17 [phone]

## 2017-12-19 NOTE — Consult Note (Signed)
Cardiology Consultation:   Patient ID: Renee Mack; [id number]; 1965-10-18   Admit date: 12/19/2017 Date of Consult: 12/19/2017  Primary Care Provider: Janalyn Shy, MD Primary Cardiologist: New - consult by Renee Mack   Patient Profile:   Renee Mack is a 52 y.o. female with a hx of IDDM, vulvar cancer s/p surgery, COPD secondary to tobacco abuse, HTN, and HLD who is being seen today for the evaluation of anterior STEMI at the request of Dr. Tressia Miners, MD.  History of Present Illness:   Renee Mack has no previously known cardiac history. She has noticed intermittent substernal chest pain for the past several days that was exertional initially, though became noticeable with rest on 6/30. Pain radiated down her left arm and up into the left side of her neck. There was some associated SOB. No diaphoresis, nausea, or vomiting.   Upon the patient's arrival to [facility] they were found to have anterior ST elevation in lead V3 only. Repeat EKG showed continued abnormalities, CXR pending. Labs showed troponin 1.29-->3.38-->3.23, WBC 10.8-->12.7, HGB 14.3, Na 133, K+ 4.1, glucose 410, BUN 14, 0.54.   Patient was taken emergently to the cath lab given abnormal EKG and persistent symptoms which showed severe 1-vessel CAD with thrombotic occlusion of the proximal LAD and significant disease in the mid and distal LAD. She underwent successful PCI/DES x 3 to the LAD to establish TIMI-3 flow. There was also 90% stenosis of the proximal RCA. EF 25-30% with akinesis of the mid to distal anterior and apical myocardium. She required norepi gtt given hypotension. Following admission to the ICU she reported 7/10 chest pain that resolved with SL NTG x 2. Repeat EKG showed changes consistent with anterior MI. Troponin has peaked as above. Echo is pending.   Past Medical History:  Diagnosis Date  . Cancer ([place])    vulvular  . COPD (chronic obstructive pulmonary disease) ([facility])    not on home oxygen  . Diabetes  mellitus without complication ([place])   . GERD (gastroesophageal reflux disease)   . HLD (hyperlipidemia)   . Pancreatitis   . Shingles     Past Surgical History:  Procedure Laterality Date  . ABDOMINAL HYSTERECTOMY     partial  . CORONARY/GRAFT ACUTE MI REVASCULARIZATION N/A 12/19/2017   Procedure: Coronary/Graft Acute MI Revascularization;  Surgeon: Renee Hampshire, MD;  Location: [place] [facility];  Service: Cardiovascular;  Laterality: N/A;  . ECTOPIC PREGNANCY SURGERY Left   . LEFT HEART CATH AND CORONARY ANGIOGRAPHY N/A 12/19/2017   Procedure: LEFT HEART CATH AND CORONARY ANGIOGRAPHY;  Surgeon: Renee [place], MD;  Location: [place] [facility];  Service: Cardiovascular;  Laterality: N/A;  . LYMPHADENECTOMY    . SPLENECTOMY, PARTIAL    . vulvulasectomy       Home Meds: Prior to Admission medications   Medication Sig Start Date End Date Taking? Authorizing Provider  acetaminophen (TYLENOL) 325 MG tablet Take 650 mg by mouth every 6 (six) hours as needed for mild pain.    Yes [provider]  albuterol (PROAIR HFA) 108 (90 Base) MCG/ACT inhaler Inhale 1 puff into the lungs every 6 (six) hours as needed for wheezing or shortness of breath.    Yes [provider]  aspirin 81 MG tablet Take 81 mg by mouth daily.   Yes [provider]  cephALEXin (KEFLEX) 500 MG capsule Take 1 capsule (500 mg total) by mouth 4 (four) times daily for 10 days. 12/12/17 12/22/17 Yes Rifenbark, Renee Deiters,  MD  citalopram (CELEXA) 20 MG tablet Take 20 mg daily by mouth.   Yes [provider]  insulin aspart (NOVOLOG) 100 UNIT/ML injection Inject 10 Units into the skin 3 (three) times daily before meals. Take 10 units PLUS sliding scale   Yes [provider]  insulin glargine (LANTUS) 100 UNIT/ML injection Inject 60 Units into the skin at bedtime.   Yes [provider]  LORazepam (ATIVAN) 1 MG tablet Take 1 mg by mouth every 8 (eight) hours.   Yes [provider]  HYDROcodone-acetaminophen (NORCO/VICODIN) 5-325 MG tablet Take 1 tablet by mouth every 4 (four) hours as needed. Patient not taking: Reported on 12/19/2017 10/27/17   Renee Dark, MD  insulin detemir (LEVEMIR) 100 UNIT/ML injection Take 33 units subcutaneously in the morning and 23 units at bedtime Patient not taking: Reported on 12/19/2017 11/04/17   Renee Mandes, MD  insulin lispro (HUMALOG) 100 UNIT/ML injection Take per sliding scale with meals:  For blood sugars  <150- no coverage 151-200- 2 units 201-250- 4 units 251-300- 6 units 301-350- 8 units and >351 call MD Patient not taking: Reported on 12/19/2017 11/04/17   Renee Mandes, MD  LORazepam (ATIVAN) 0.5 MG tablet Take 1 tablet (0.5 mg total) by mouth every 8 (eight) hours as needed for anxiety. Patient not taking: Reported on 12/19/2017 02/06/17 02/06/18  Renee Roca, MD  ondansetron (ZOFRAN ODT) 4 MG disintegrating tablet Take 1 tablet (4 mg total) by mouth every 8 (eight) hours as needed for nausea or vomiting. Patient not taking: Reported on 12/19/2017 09/01/17   Renee Re, MD  phenazopyridine (PYRIDIUM) 200 MG tablet Take 1 tablet (200 mg total) by mouth 3 (three) times daily as needed for pain. Patient not taking: Reported on 12/19/2017 12/12/17 12/12/18  Renee Hong, MD  protein supplement shake (PREMIER PROTEIN) LIQD Take 325 mLs (11 oz total) by mouth 2 (two) times daily between meals. Patient not taking: Reported on 12/19/2017 11/04/17   Renee Mandes, MD  sucralfate (CARAFATE) 1 g tablet Take 1 tablet (1 g total) by mouth 4 (four) times daily. Patient not taking: Reported on 12/19/2017 08/01/17 08/01/18  Renee Newport, MD    Inpatient Medications: Scheduled Meds: . aspirin EC  81 mg Oral Daily  . atorvastatin  80 mg Oral q1800  . citalopram  20 mg Oral Daily  . [START ON 12/20/2017] enoxaparin (LOVENOX) injection  40 mg Subcutaneous Q24H  . insulin aspart  0-5 Units Subcutaneous QHS  . insulin aspart  0-9  Units Subcutaneous TID WC  . insulin aspart  8 Units Subcutaneous TID WC  . insulin glargine  60 Units Subcutaneous QHS  . LORazepam  0.5 mg Oral Q8H  . nicotine  14 mg Transdermal Daily  . sodium chloride flush  3 mL Intravenous Q12H  . ticagrelor  90 mg Oral BID   Continuous Infusions: . sodium chloride 50 mL/hr at 12/19/17 0941  . sodium chloride    . norepinephrine (LEVOPHED) Adult infusion 3 mcg/min (12/19/17 0941)  . tirofiban     PRN Meds: sodium chloride, acetaminophen, ipratropium-albuterol, ondansetron (ZOFRAN) IV, sodium chloride flush  Allergies:   Allergies  Allergen Reactions  . Bee Venom Itching, Shortness Of Breath and Swelling  . Metformin And Related Shortness Of Breath  . Darvon [Propoxyphene] Itching  . Gabapentin Swelling  . Nsaids Other (See Comments)    Ulcers   . Tramadol Hives    Social History:   Social History   Socioeconomic History  .  Marital status: Widowed    Spouse name: Not on file  . Number of children: Not on file  . Years of education: Not on file  . Highest education level: Not on file  Occupational History  . Not on file  Social Needs  . Financial resource strain: Not on file  . Food insecurity:    Worry: Not on file    Inability: Not on file  . Transportation needs:    Medical: Not on file    Non-medical: Not on file  Tobacco Use  . Smoking status: Current Every Day Smoker    Packs/day: 0.50    Years: 30.00    Pack years: 15.00    Types: Cigarettes  . Smokeless tobacco: Never Used  Substance and Sexual Activity  . Alcohol use: No  . Drug use: No  . Sexual activity: Not on file  Lifestyle  . Physical activity:    Days per week: Not on file    Minutes per session: Not on file  . Stress: Not on file  Relationships  . Social connections:    Talks on phone: Not on file    Gets together: Not on file    Attends religious service: Not on file    Active member of club or organization: Not on file    Attends meetings  of clubs or organizations: Not on file    Relationship status: Not on file  . Intimate partner violence:    Fear of current or ex partner: Not on file    Emotionally abused: Not on file    Physically abused: Not on file    Forced sexual activity: Not on file  Other Topics Concern  . Not on file  Social History Narrative  . Not on file     Family History:  Family History  Problem Relation Age of Onset  . Cancer Mother   . Osteoporosis Sister   . Heart disease Brother   . Heart attack Father     ROS:  Review of Systems  Constitutional: Positive for malaise/fatigue. Negative for chills, diaphoresis, fever and weight loss.  HENT: Negative for congestion.   Eyes: Negative for discharge and redness.  Respiratory: Negative for cough, hemoptysis, sputum production, shortness of breath and wheezing.   Cardiovascular: Positive for chest pain. Negative for palpitations, orthopnea, claudication, leg swelling and PND.  Gastrointestinal: Negative for abdominal pain, blood in stool, heartburn, melena, nausea and vomiting.  Genitourinary: Negative for hematuria.  Musculoskeletal: Negative for falls and myalgias.  Skin: Negative for rash.  Neurological: Positive for weakness. Negative for dizziness, tingling, tremors, sensory change, speech change, focal weakness and loss of consciousness.  Endo/Heme/Allergies: Does not bruise/bleed easily.  Psychiatric/Behavioral: Negative for substance abuse. The patient is not nervous/anxious.   All other systems reviewed and are negative.     Physical Exam/Data:   Vitals:   12/19/17 0839 12/19/17 0843 12/19/17 0900 12/19/17 1000  BP:  (!) 90/40 96/63 108/88  Pulse:   70 78  Resp:  15 17 (!) 22  Temp:  97.6 F (36.4 C)    TempSrc:  Oral    SpO2:  94% 96% 98%  Weight: 134 lb 0.6 oz (60.8 kg)     Height: 5\' 1"  (1.549 m)      No intake or output data in the 24 hours ending 12/19/17 1054 Filed Weights   12/19/17 0612 12/19/17 0839  Weight: 130  lb (59 kg) 134 lb 0.6 oz (60.8 kg)   Body  mass index is 25.33 kg/m.   Physical Exam: General: Well developed, well nourished, in no acute distress. Head: Normocephalic, atraumatic, sclera non-icteric, no xanthomas, nares without discharge.  Neck: Negative for carotid bruits. JVD not elevated. Lungs: Clear bilaterally to auscultation without wheezes, rales, or rhonchi. Breathing is unlabored. Heart: RRR with S1 S2. No murmurs, rubs, or gallops appreciated. Abdomen: Soft, non-tender, non-distended with normoactive bowel sounds. No hepatomegaly. No rebound/guarding. No obvious abdominal masses. Msk:  Strength and tone appear normal for age. Extremities: No clubbing or cyanosis. No edema. Distal pedal pulses are 2+ and equal bilaterally. Neuro: Alert and oriented X 3. No facial asymmetry. No focal deficit. Moves all extremities spontaneously. Psych:  Responds to questions appropriately with a normal affect.   EKG:  The EKG was personally reviewed and demonstrates: NSR, 85 bpm, left axis deviation, st elevation V2-V3 with reciprocal changes  Telemetry:  Telemetry was personally reviewed and demonstrates: NSR  Weights: Filed Weights   12/19/17 0612 12/19/17 0839  Weight: 130 lb (59 kg) 134 lb 0.6 oz (60.8 kg)    Relevant CV Studies: LHC 12/19/2017: Conclusion     Prox Cx lesion is 30% stenosed.  Prox LAD lesion is 100% stenosed.  Mid LAD lesion is 80% stenosed.  Dist LAD lesion is 80% stenosed.  Post intervention, there is a 0% residual stenosis.  A drug-eluting stent was successfully placed using a STENT RESOLUTE ONYX 2.25X26.  Post intervention, there is a 0% residual stenosis.  Post intervention, there is a 10% residual stenosis.  A drug-eluting stent was successfully placed using a STENT RESOLUTE ONYX 2.0X15.  A drug-eluting stent was successfully placed using a STENT RESOLUTE ONYX 2.0X12.  The left ventricular ejection fraction is 25-35% by visual estimate.  Prox  RCA lesion is 90% stenosed.   1.  Severe one-vessel coronary artery disease with thrombotic occlusion of the proximal LAD and significant disease in the mid and distal LAD.  Left dominant system.  Significant proximal stenosis in a nondominant right coronary artery. 2.  Moderately to severely reduced LV systolic function with an EF of 30% with akinesis of the mid to distal anterior and apical myocardium. 3.  Moderately elevated left ventricular end-diastolic pressure at 26 mmHg. 4.  Successful angioplasty and 3 drug-eluting stent placement to the LAD to establish TIMI-3 flow.  Recommendations Continue Aggrastat for 12 hours. Dual antiplatelet therapy for at least one year Aggressive treatment of risk factors. Obtain an echocardiogram. Wean off norepinephrine drip and start small dose carvedilol once blood pressure tolerates.  Please note that the initial EKGs did not meet STEMI criteria given that there was ST elevation only in V3.  However, given the patient's continued symptoms and change in her EKG from prior one, we decided to proceed with urgent cardiac catheterization.     Laboratory Data:  Chemistry Recent Labs  Lab 12/19/17 0626 12/19/17 0934  NA 133*  --   K 4.1  --   CL 98  --   CO2 23  --   GLUCOSE 410*  --   BUN 14  --   CREATININE 0.54 0.51  CALCIUM 9.4  --   GFRNONAA >60 >60  GFRAA >60 >60  ANIONGAP 12  --     No results for input(s): PROT, ALBUMIN, AST, ALT, ALKPHOS, BILITOT in the last 168 hours. Hematology Recent Labs  Lab 12/19/17 0626 12/19/17 0934  WBC 10.8 12.7*  RBC 5.00 4.52  HGB 16.1* 14.3  HCT 48.1* 43.0  MCV 96.2  95.0  MCH 32.3 31.5  MCHC 33.5 33.2  RDW 13.9 13.7  PLT 312 317   Cardiac Enzymes Recent Labs  Lab 12/19/17 0626 12/19/17 0934  TROPONINI 1.29* 3.38*   No results for input(s): TROPIPOC in the last 168 hours.  BNPNo results for input(s): BNP, PROBNP in the last 168 hours.  DDimer No results for input(s): DDIMER in the  last 168 hours.  Radiology/Studies:  No results found.  Assessment and Plan:   1. NSTEMI: -Initial EKG did not meet STEMI criteria -Troponin peaked at 3.38, now down trending  -Status post successful PCI/DES to the LAD as above with residual stenosis of the RCA as noted above -Currently chest pain free -Wean norepi as able with escalation of Coreg as able -DAPT with ASA and Brilinta -Will likely need staged PCI of the RCA, especially if she redevelops angina -Echo pending -Cardiac rehab -Secondary prevention -Lipitor  2. Cardiogenic shock/ICM/acute systolic CHF: -Wean norepi as able with escalation of Coreg as BP allow -Echo pending, if EF remains < 35% she will require LifeVest prior to discharge -BP precludes addition of evidence-based heart failure therapy at this time -CHF education as she progresses  3. COPD/tobacco abuse: -Cessation advised   4. HLD: -Lipitor  5. IDDM: -SSI per IM   For questions or updates, please contact [place] Please consult [email or web address] for contact info under Cardiology/STEMI.   Signed, Christell Faith, PA-C [place] Pager: [phone] 12/19/2017, 10:54 AM

## 2017-12-19 NOTE — Progress Notes (Signed)
ANTICOAGULATION CONSULT NOTE - Initial Consult  Pharmacy Consult for Heparin  Indication: chest pain/ACS  Allergies  Allergen Reactions  . Bee Venom Itching, Shortness Of Breath and Swelling  . Metformin And Related Shortness Of Breath  . Darvon [Propoxyphene] Itching  . Gabapentin Swelling  . Nsaids Other (See Comments)    Ulcers   . Tramadol Hives    Patient Measurements: Height: 5\' 1"  (154.9 cm) Weight: 130 lb (59 kg) IBW/kg (Calculated) : 47.8 Heparin Dosing Weight:  59 kg   Vital Signs: Temp: 98 F (36.7 C) (07/01 0610) Temp Source: Oral (07/01 0610) BP: 88/69 (07/01 0640) Pulse Rate: 78 (07/01 0642)  Labs: Recent Labs    12/19/17 0626  HGB 16.1*  HCT 48.1*  PLT 312    Estimated Creatinine Clearance: 66.4 mL/min (by C-G formula based on SCr of 0.45 mg/dL).   Medical History: Past Medical History:  Diagnosis Date  . Cancer ([place])    vulvular  . COPD (chronic obstructive pulmonary disease) ([place])   . Diabetes mellitus without complication ([place])   . GERD (gastroesophageal reflux disease)   . HLD (hyperlipidemia)   . Pancreatitis   . Shingles     Medications:   (Not in a hospital admission)  Assessment: Pharmacy consulted to dose heparin in this 52 year old female with ACS, NSTEMI. No prior anticoag noted. CrCl = 66.4 ml/min  Goal of Therapy:  Heparin level 0.3-0.7 units/ml Monitor platelets by anticoagulation protocol: Yes   Plan:   Give 3500 units bolus x 1 Start heparin infusion at 700 units/hr Check anti-Xa level in 6 hours and daily while on heparin Continue to monitor H&H and platelets  Jessicalynn Deshong D 12/19/2017,6:47 AM

## 2017-12-20 DIAGNOSIS — Z720 Tobacco use: Secondary | ICD-10-CM

## 2017-12-20 DIAGNOSIS — I255 Ischemic cardiomyopathy: Secondary | ICD-10-CM

## 2017-12-20 DIAGNOSIS — Z87891 Personal history of nicotine dependence: Secondary | ICD-10-CM

## 2017-12-20 DIAGNOSIS — I5021 Acute systolic (congestive) heart failure: Secondary | ICD-10-CM

## 2017-12-20 DIAGNOSIS — J449 Chronic obstructive pulmonary disease, unspecified: Secondary | ICD-10-CM

## 2017-12-20 DIAGNOSIS — E785 Hyperlipidemia, unspecified: Secondary | ICD-10-CM

## 2017-12-20 DIAGNOSIS — I2109 ST elevation (STEMI) myocardial infarction involving other coronary artery of anterior wall: Secondary | ICD-10-CM

## 2017-12-20 DIAGNOSIS — R570 Cardiogenic shock: Secondary | ICD-10-CM

## 2017-12-20 LAB — BASIC METABOLIC PANEL
ANION GAP: 11 (ref 5–15)
BUN: 14 mg/dL (ref 6–20)
CHLORIDE: 103 mmol/L (ref 98–111)
CO2: 21 mmol/L — ABNORMAL LOW (ref 22–32)
Calcium: 8.7 mg/dL — ABNORMAL LOW (ref 8.9–10.3)
Creatinine, Ser: 0.4 mg/dL — ABNORMAL LOW (ref 0.44–1.00)
GFR calc Af Amer: >60 mL/min (ref >60)
Glucose, Bld: 332 mg/dL — ABNORMAL HIGH (ref 70–99)
Potassium: 4.2 mmol/L (ref 3.5–5.1)
SODIUM: 135 mmol/L (ref 135–145)

## 2017-12-20 LAB — MAGNESIUM: Magnesium: 1.8 mg/dL (ref 1.7–2.4)

## 2017-12-20 LAB — CBC
HCT: 41.9 % (ref 35.0–47.0)
HEMOGLOBIN: 14 g/dL (ref 12.0–16.0)
MCH: 31.9 pg (ref 26.0–34.0)
MCHC: 33.4 g/dL (ref 32.0–36.0)
MCV: 95.7 fL (ref 80.0–100.0)
Platelets: 254 10*3/uL (ref 150–440)
RBC: 4.38 MIL/uL (ref 3.80–5.20)
RDW: 13.9 % (ref 11.5–14.5)
WBC: 10 10*3/uL (ref 3.6–11.0)

## 2017-12-20 LAB — GLUCOSE, CAPILLARY
GLUCOSE-CAPILLARY: 301 mg/dL — AB (ref 70–99)
GLUCOSE-CAPILLARY: 260 mg/dL — AB (ref 70–99)
Glucose-Capillary: 194 mg/dL — ABNORMAL HIGH (ref 70–99)
Glucose-Capillary: 262 mg/dL — ABNORMAL HIGH (ref 70–99)
Glucose-Capillary: 358 mg/dL — ABNORMAL HIGH (ref 70–99)

## 2017-12-20 LAB — PHOSPHORUS: PHOSPHORUS: 3.4 mg/dL (ref 2.5–4.6)

## 2017-12-20 MED ORDER — INSULIN ASPART 100 UNIT/ML ~~LOC~~ SOLN
0.0000 [IU] | Freq: Every day | SUBCUTANEOUS | Status: DC
  Administered 2017-12-20: 5 [IU] via SUBCUTANEOUS
  Filled 2017-12-20: qty 1

## 2017-12-20 MED ORDER — INSULIN ASPART 100 UNIT/ML ~~LOC~~ SOLN
0.0000 [IU] | Freq: Three times a day (TID) | SUBCUTANEOUS | Status: DC
Start: 2017-12-20 — End: 2017-12-21
  Administered 2017-12-20: 11 [IU] via SUBCUTANEOUS
  Administered 2017-12-20: 15 [IU] via SUBCUTANEOUS
  Administered 2017-12-20: 4 [IU] via SUBCUTANEOUS
  Administered 2017-12-21: 15 [IU] via SUBCUTANEOUS
  Administered 2017-12-21: 7 [IU] via SUBCUTANEOUS
  Filled 2017-12-20 (×5): qty 1

## 2017-12-20 NOTE — Progress Notes (Signed)
Inpatient Diabetes Program Recommendations  AACE/ADA: New Consensus Statement on Inpatient Glycemic Control (2015)  Target Ranges:  Prepandial:   less than 140 mg/dL      Peak postprandial:   less than 180 mg/dL (1-2 hours)      Critically ill patients:  140 - 180 mg/dL   Lab Results  Component Value Date   GLUCAP 190 (H) 12/19/2017   HGBA1C 12.1 (H) 12/19/2017    Review of Glycemic ControlResults for BRUNILDA, EBLE (MRN [id number]) as of 12/20/2017 07:21  Ref. Range 12/19/2017 08:48 12/19/2017 12:06 12/19/2017 16:10 12/19/2017 21:30  Glucose-Capillary Latest Ref Range: 70 - 99 mg/dL 334 (H) 324 (H) 191 (H) 190 (H)    Diabetes history: Type 2 DM Outpatient Diabetes medications: Lantus 60 units q HS Current orders for Inpatient glycemic control:  Lantus 30 units daily, Novolog sensitive tid with meals and HS, Novolog 8 units tid with meals  Recommendations: Note patient did NOT receive Lantus on 7/1 due to RN's report that patient took Lantus on 6/30 in the PM.  Therefore fasting blood sugar elevated this morning b/c patient did not receive her basal insulin on 12/19/17.  She will receive Lantus this morning which should help.  May need slight reduction in meal coverage to 5 units tid with meals.  I plan to talk to patient today regarding A1C which indicates average blood sugar of approximately 300 mg/dL.    Thanks,  Adah Perl, RN, BC-ADM Inpatient Diabetes Coordinator Pager [phone] (8a-5p)

## 2017-12-20 NOTE — Progress Notes (Signed)
Transferred to 2-A as ordered.  Report called to [employer], Therapist, sports.  Left floor via wheelchair with all personal belongings in tos.

## 2017-12-20 NOTE — Progress Notes (Signed)
   12/20/17 1410  Clinical Encounter Type  Visited With Patient  Visit Type Follow-up  Spiritual Encounters  Spiritual Needs Emotional;Prayer   Chaplain followed back up with patient to open conversation regarding upcoming move to daughter's home.  Patient spoke of many transitions over the past year, especially the impact of her husband's death.  She shared stories of how she has seen God moving and how faith sustains her.  Patient and chaplain engaged in dialogue regarding patient's faith story and her tendency to 'hold onto her worry' rather than letting God handle it.  Chaplain and patient explored ways in which she could practice letting God hold the worry for her.  Patient requested prayer which chaplain led.

## 2017-12-20 NOTE — Progress Notes (Signed)
Inpatient Diabetes Program Recommendations  AACE/ADA: New Consensus Statement on Inpatient Glycemic Control (2015)  Target Ranges:  Prepandial:   less than 140 mg/dL      Peak postprandial:   less than 180 mg/dL (1-2 hours)      Critically ill patients:  140 - 180 mg/dL   Lab Results  Component Value Date   GLUCAP 194 (H) 12/20/2017   HGBA1C 12.1 (H) 12/19/2017  Results for Renee Mack, Renee Mack (MRN [id number]) as of 12/20/2017 15:59  Ref. Range 06/12/2017 15:37 12/19/2017 09:34  Hemoglobin A1C Latest Ref Range: 4.8 - 5.6 % 15.8 (H) 12.1 (H)    Review of Glycemic ControlResults for Renee Mack, Renee Mack (MRN [id number]) as of 12/20/2017 15:59  Ref. Range 12/20/2017 07:23 12/20/2017 11:24  Glucose-Capillary Latest Ref Range: 70 - 99 mg/dL 301 (H) 194 (H)  Diabetes history: Type 2 DM Outpatient Diabetes medications:Lantus 60 units q HS Current orders for Inpatient glycemic control: Lantus 30 units daily, Novolog resistant tid with meals and HS  Inpatient Diabetes Program Recommendations:    Spoke to patient regarding elevated A1C. Patient states that this is an improvement from last time. She states that she has been working very hard on the improving her A1C.  She has been giving her insulin in her abdomen.  She reports rotating sites, however it seems she is still injecting mostly in the same area.  Encouraged her to rotate to "love handles" and to stay away from scarring on abdomen.  Patient states "That's easy and I hope it helps".  Discussed with RN as well.  Patient likely DOES NOT need as much insulin at home as was ordered.  Patient was very open about wanting to control her blood sugars to prevent complications.  Please d/c Novolog resistant correction and restart Novolog sensitive tid with meals.  Also please add Novolog 5 units tid with meals.  Text page sent.  Patient is to follow up with PCP.    Thanks,  Adah Perl, RN, BC-ADM Inpatient Diabetes Coordinator Pager [phone] (8a-5p)

## 2017-12-20 NOTE — Progress Notes (Signed)
    Case manager consult has been placed for LifeVest. Rx for LifeVest has been completed and placed in the patient's paper chart.

## 2017-12-20 NOTE — Progress Notes (Signed)
   12/20/17 1035  Clinical Encounter Type  Visited With Patient and family together  Visit Type Follow-up  Spiritual Encounters  Spiritual Needs Emotional   Chaplain followed up with patient whom she had met in ED Monday morning.  Patient spoke of daughter's visit and her intention to take patient back to [place] after patient's discharge.  Patient shared thoughts and feelings regarding potential move and stated that he was going to 'trust in God's will' and not her own.  Daughter entered room and began conversation about the move and details needing to be completed.  Chaplain offered to excuse herself and check in later so that patient and daughter could have time to talk together.

## 2017-12-20 NOTE — Plan of Care (Signed)
  Problem: Education: Goal: Knowledge of General Education information will improve Outcome: Progressing   Problem: Health Behavior/Discharge Planning: Goal: Ability to manage health-related needs will improve Outcome: Progressing   Problem: Clinical Measurements: Goal: Ability to maintain clinical measurements within normal limits will improve Outcome: Progressing Goal: Will remain free from infection Outcome: Progressing Goal: Diagnostic test results will improve Outcome: Progressing Goal: Respiratory complications will improve Outcome: Progressing Goal: Cardiovascular complication will be avoided Outcome: Progressing   Problem: Activity: Goal: Risk for activity intolerance will decrease Outcome: Progressing   Problem: Nutrition: Goal: Adequate nutrition will be maintained Outcome: Progressing   Problem: Coping: Goal: Level of anxiety will decrease Outcome: Progressing   Problem: Elimination: Goal: Will not experience complications related to bowel motility Outcome: Progressing Goal: Will not experience complications related to urinary retention Outcome: Progressing   Problem: Pain Managment: Goal: General experience of comfort will improve Outcome: Progressing   Problem: Safety: Goal: Ability to remain free from injury will improve Outcome: Progressing   Problem: Skin Integrity: Goal: Risk for impaired skin integrity will decrease Outcome: Progressing   Problem: Activity: Goal: Ability to return to baseline activity level will improve Outcome: Progressing   Problem: Cardiovascular: Goal: Ability to achieve and maintain adequate cardiovascular perfusion will improve Outcome: Progressing Goal: Vascular access site(s) Level 0-1 will be maintained Outcome: Progressing

## 2017-12-20 NOTE — Progress Notes (Signed)
Follow up - Critical Care Medicine Note  Patient Details:    Renee Mack is an 52 y.o. female.past medical history of diabetes, or cancer, COPD, hyperlipidemia, ongoing tobacco use, presented with chest tightness and intermittent pain. EKG revealed subtle ST segment elevations in the anterior leads. Sent for emergent cardiac catheterization and was found to have diffusely stenosed LAD, status post stenting 3. Transferred to the intensive care unit for further evaluation and management.  Lines, Airways, Drains:    Anti-infectives:  Anti-infectives (From admission, onward)   None      Microbiology: Results for orders placed or performed during the hospital encounter of 12/19/17  MRSA PCR Screening     Status: None   Collection Time: 12/19/17  8:41 AM  Result Value Ref Range Status   MRSA by PCR NEGATIVE NEGATIVE Final    Comment:        The GeneXpert MRSA Assay (FDA approved for NASAL specimens only), is one component of a comprehensive MRSA colonization surveillance program. It is not intended to diagnose MRSA infection nor to guide or monitor treatment for MRSA infections. Performed at [facility], [address], [place], [place] [address]      Studies: No results found.  Consults: Treatment Team:  Wellington Hampshire, MD   Subjective:    Overnight Issues: patient has been weaned off of pressors, awake, alert, no chest pain and hemodynamically stable  Objective:  Vital signs for last 24 hours: Temp:  [97.6 F (36.4 C)-97.9 F (36.6 C)] 97.6 F (36.4 C) (07/02 0300) Pulse Rate:  [67-93] 79 (07/02 0737) Resp:  [12-27] 18 (07/02 0300) BP: (75-109)/(40-88) 89/64 (07/02 0737) SpO2:  [93 %-100 %] 95 % (07/02 0300) Weight:  [134 lb 0.6 oz (60.8 kg)] 134 lb 0.6 oz (60.8 kg) (07/01 0839)  Hemodynamic parameters for last 24 hours:    Intake/Output from previous day: 07/01 0701 - 07/02 0700 In: 937.9 [I.V.:937.9] Out: 1000  [Urine:1000]  Intake/Output this shift: No intake/output data recorded.  Vent settings for last 24 hours:    Physical Exam:  Vital signs: Please see the above listed vital signs HEENT: No oral lesions appreciated, trachea is midline, no thyromegaly noted, no carotid bruits appreciated Cardiovascular: Regular rate and rhythm Pulmonary: Clear to auscultation Abdominal: Positive bowel sounds, soft exam Extremities: Patient had a right wrist radial approach with warm fingers and palpable pulses Neurologic: No focal deficits noted  Assessment/Plan:   STEMI. Status post emergent cardiac catheterization with LAD stenting 3/PCI/DES. Patient is presently on aspirin, statin, Coreg and Brilinat. Cardiomyopathy is noted with ejection fraction of 30% on catheterization  Diabetes mellitus. On sliding scale  History of COPD. On as needed bronchodilators. Lungs are clear at this time   And cardiology will most likely be able to be transferred to cart telemetry today  Renee Mack 12/20/2017  *Care during the described time interval was provided by me and/or other providers on the critical care team.  I have reviewed this patient's available data, including medical history, events of note, physical examination and test results as part of my evaluation.

## 2017-12-20 NOTE — Progress Notes (Signed)
Progress Note  Patient Name: Renee Mack Date of Encounter: 12/20/2017  Primary Cardiologist: Thurston Hole  Subjective   Mild chest discomfort yesterday morning relieved by SL NTG.  No further CP or shortness of breath.  Shoulder and upper back still a little sore but better than yesterday.  Weaned of norepinephrine yesterday.  Inpatient Medications    Scheduled Meds: . aspirin EC  81 mg Oral Daily  . atorvastatin  80 mg Oral q1800  . carvedilol  3.125 mg Oral BID WC  . citalopram  20 mg Oral Daily  . enoxaparin (LOVENOX) injection  40 mg Subcutaneous Q24H  . insulin aspart  0-20 Units Subcutaneous TID WC  . insulin aspart  0-5 Units Subcutaneous QHS  . insulin glargine  30 Units Subcutaneous Daily  . LORazepam  1 mg Oral Q8H  . nicotine  14 mg Transdermal Daily  . sodium chloride flush  3 mL Intravenous Q12H  . ticagrelor  90 mg Oral BID   Continuous Infusions: . sodium chloride    . norepinephrine (LEVOPHED) Adult infusion Stopped (12/19/17 1500)   PRN Meds: sodium chloride, acetaminophen, ipratropium-albuterol, nitroGLYCERIN, ondansetron (ZOFRAN) IV, oxyCODONE-acetaminophen, sodium chloride flush   Vital Signs    Vitals:   12/20/17 0200 12/20/17 0300 12/20/17 0737 12/20/17 0800  BP: (!) 100/59 (!) 83/49 (!) 89/64 94/73  Pulse: 71 77 79 84  Resp: 12 18  14   Temp:  97.6 F (36.4 C)    TempSrc:  Axillary    SpO2: 94% 95%  99%  Weight:      Height:        Intake/Output Summary (Last 24 hours) at 12/20/2017 0900 Last data filed at 12/20/2017 0457 Gross per 24 hour  Intake 937.94 ml  Output 1000 ml  Net -62.06 ml   Filed Weights   12/19/17 0612 12/19/17 0839  Weight: 130 lb (59 kg) 134 lb 0.6 oz (60.8 kg)    Telemetry    NSR - Personally Reviewed  ECG    NSR with anterior MI and anterolateral TWI - Personally Reviewed  Physical Exam   GEN: No acute distress.   Neck: No JVD Cardiac: RRR, no murmurs, rubs, or gallops. Right radial cath site intact  without hematoma. Respiratory: Clear to auscultation bilaterally. GI: Soft, nontender, non-distended  MS: No edema; No deformity. Neuro:  Nonfocal  Psych: Normal affect   Labs    Chemistry Recent Labs  Lab 12/19/17 0626 12/19/17 0934 12/20/17 0631  NA 133*  --  135  K 4.1  --  4.2  CL 98  --  103  CO2 23  --  21*  GLUCOSE 410*  --  332*  BUN 14  --  14  CREATININE 0.54 0.51 0.40*  CALCIUM 9.4  --  8.7*  GFRNONAA >60 >60 >60  GFRAA >60 >60 >60  ANIONGAP 12  --  11     Hematology Recent Labs  Lab 12/19/17 0626 12/19/17 0934 12/20/17 0631  WBC 10.8 12.7* 10.0  RBC 5.00 4.52 4.38  HGB 16.1* 14.3 14.0  HCT 48.1* 43.0 41.9  MCV 96.2 95.0 95.7  MCH 32.3 31.5 31.9  MCHC 33.5 33.2 33.4  RDW 13.9 13.7 13.9  PLT 312 317 254    Cardiac Enzymes Recent Labs  Lab 12/19/17 0626 12/19/17 0934 12/19/17 1711 12/19/17 2156  TROPONINI 1.29* 3.23*  3.38* 2.64* 2.41*   No results for input(s): TROPIPOC in the last 168 hours.   BNPNo results for input(s): BNP,  PROBNP in the last 168 hours.   DDimer No results for input(s): DDIMER in the last 168 hours.   Radiology    No results found.  Cardiac Studies   TTE (12/19/17): - Left ventricle: The cavity size was normal. There was mild   concentric hypertrophy. Systolic function was moderately to   severely reduced. The estimated ejection fraction was in the   range of 30% to 35%. Akinesis of the mid-apicalanteroseptal,   anterior, and apical myocardium. Doppler parameters are   consistent with abnormal left ventricular relaxation (grade 1   diastolic dysfunction).  LHC/PCI (12/19/17): 1.  Severe one-vessel coronary artery disease with thrombotic occlusion of the proximal LAD and significant disease in the mid and distal LAD.  Left dominant system.  Significant proximal stenosis in a nondominant right coronary artery. 2.  Moderately to severely reduced LV systolic function with an EF of 30% with akinesis of the mid to distal  anterior and apical myocardium. 3.  Moderately elevated left ventricular Renee Mack-diastolic pressure at 26 mmHg. 4.  Successful angioplasty and 3 drug-eluting stent placement to the LAD to establish TIMI-3 flow.  Patient Profile     52 y.o. female with history of IDDM, vulvar cancer s/p surgery, COPD secondary to tobacco abuse, HTN, and HLD, admitted with late-presenting anterior STEMI s/p primary PCI to LAD.  Assessment & Plan    STEMI Late presentation based on history (duration of symptoms), EKG findings, angiographic appearance, and only mild troponin elevation.  Continue DAPT with ASA and ticagrelor for at least 12 months.  Continue high-intensity statin.  Outpatient cardiac rehab.  Ischemic cardiomyopathy with acute systolic heart failure and cardiogenic shock. Moderately to severely reduced LVEF by echo and LV-gram.  Patient weaned of norepinephrine with normal Renee Mack-organ function; cardiogenic shock resolved.  Renee Mack still reports 2-3 pillow orthopnea (chronic) but otherwise appears euvolemic.  Continue carvedilol 3.125 mg BID.  Soft blood pressure precludes addition of other evidence-based HF therapy at this time.  Maintain net even fluid balance.  We will arrange for LifeVest at the time of discharge.  Hyperlipidemia LDL on admission 143 (goal < 70).  Atorvastatin 80 mg daily  COPD and tobacco abuse Likely contributing to chronic orthopnea.  Tobacco cessation counseling provided.  Patient is eager to quit.  Disposition:  Ok for transfer to telemetry today.  Patient should be monitored in house at least 1 more night.     For questions or updates, please contact [place] Please consult [email or web address] for contact info [facility].     Signed, Nelva Bush, MD  12/20/2017, 9:00 AM

## 2017-12-20 NOTE — Progress Notes (Signed)
[facility] [place] NAME: Renee Mack    MR#:  [id number]  DATE OF BIRTH:  02-24-1966  SUBJECTIVE:   She presented to the hospital secondary to chest pain and noted to have ST elevation MI.  Status post cardiac catheterization with 3 stents to the LAD.  Presently chest pain-free and hemodynamically stable.  No other acute complaints presently.  Plan to transfer to telemetry floor later today.  REVIEW OF SYSTEMS:    Review of Systems  Constitutional: Negative for chills and fever.  HENT: Negative for congestion and tinnitus.   Eyes: Negative for blurred vision and double vision.  Respiratory: Negative for cough, shortness of breath and wheezing.   Cardiovascular: Negative for chest pain, orthopnea and PND.  Gastrointestinal: Negative for abdominal pain, diarrhea, nausea and vomiting.  Genitourinary: Negative for dysuria and hematuria.  Musculoskeletal: Positive for neck pain.  Neurological: Negative for dizziness, sensory change and focal weakness.  All other systems reviewed and are negative.   Nutrition: Carb modified/heart healthy Tolerating Diet: Yes Tolerating PT: Ambulatory     DRUG ALLERGIES:   Allergies  Allergen Reactions  . Bee Venom Itching, Shortness Of Breath and Swelling  . Metformin And Related Shortness Of Breath  . Darvon [Propoxyphene] Itching  . Gabapentin Swelling  . Nsaids Other (See Comments)    Ulcers   . Tramadol Hives    VITALS:  Blood pressure (!) 85/61, pulse 75, temperature 98 F (36.7 C), temperature source Oral, resp. rate 17, height 5\' 1"  (1.549 m), weight 60.8 kg (134 lb 0.6 oz), SpO2 95 %.  PHYSICAL EXAMINATION:   Physical Exam  GENERAL:  52 y.o.-year-old patient sitting up in chair in no acute distress.  EYES: Pupils equal, round, reactive to light and accommodation. No scleral icterus. Extraocular muscles intact.  HEENT: Head atraumatic, normocephalic. Oropharynx and nasopharynx  clear.  NECK:  Supple, no jugular venous distention. No thyroid enlargement, no tenderness.  LUNGS: Normal breath sounds bilaterally, no wheezing, rales, rhonchi. No use of accessory muscles of respiration.  CARDIOVASCULAR: S1, S2 normal. No murmurs, rubs, or gallops.  ABDOMEN: Soft, nontender, nondistended. Bowel sounds present. No organomegaly or mass.  EXTREMITIES: No cyanosis, clubbing or edema b/l.    NEUROLOGIC: Cranial nerves II through XII are intact. No focal Motor or sensory deficits b/l.   PSYCHIATRIC: The patient is alert and oriented x 3.  SKIN: No obvious rash, lesion, or ulcer.    LABORATORY PANEL:   CBC Recent Labs  Lab 12/20/17 0631  WBC 10.0  HGB 14.0  HCT 41.9  PLT 254   ------------------------------------------------------------------------------------------------------------------  Chemistries  Recent Labs  Lab 12/20/17 0631  NA 135  K 4.2  CL 103  CO2 21*  GLUCOSE 332*  BUN 14  CREATININE 0.40*  CALCIUM 8.7*  MG 1.8   ------------------------------------------------------------------------------------------------------------------  Cardiac Enzymes Recent Labs  Lab 12/19/17 2156  TROPONINI 2.41*   ------------------------------------------------------------------------------------------------------------------  RADIOLOGY:  No results found.   ASSESSMENT AND PLAN:   Renee Mack  is a 52 y.o. female with a known history of insulin-dependent diabetes mellitus, vulvar cancer status post surgery, [name], hyperlipidemia, ongoing smoking presents to hospital secondary to chest tightness.  1. STEMI- anterior wall STEMI, status post cardiac catheterization with 3 stents to the LAD. - Currently chest pain-free and hemodynamically stable. - Continue aspirin, Brilinta, low-dose carvedilol.  Patient was on some levoped due to low blood pressures therefore but now weaned off.  -Ejection  fraction during cardiac catheterization was  down to 30%.  As per cardiology patient will be evaluated for a CHF vest today. - cont. Atorvastatin.   2.  DM Type II - uncontrolled.  - cont. Lantus, NovoLog sliding scale.  Appreciate diabetes coordinator input.  3.  Tobacco abuse-continue nicotine patch.  4.  Essential hypertension-patient's blood pressures are on the low side continue carvedilol as tolerated.  5.  Depression-continue Celexa.  6.  COPD-stable at this time.  PRN nebulizers.  No indication for systemic steroids.  Transfer to tele and will d/c tomorrow.   All the records are reviewed and case discussed with Care Management/Social Worker. Management plans discussed with the patient, family and they are in agreement.  CODE STATUS: Full code  DVT Prophylaxis: Lovenox  TOTAL TIME TAKING CARE OF THIS PATIENT: 30 minutes.   POSSIBLE D/C IN 1-2 DAYS, DEPENDING ON CLINICAL CONDITION.   Henreitta Leber M.D on 12/20/2017 at 4:34 PM  Between 7am to 6pm - Pager - [phone]  After 6pm go to [email or web address] - Proofreader  [facility]  Office  [phone]  CC: Primary care physician; Carolyn Stare [name], MD

## 2017-12-21 ENCOUNTER — Telehealth: Payer: Self-pay | Admitting: Cardiovascular Disease

## 2017-12-21 LAB — GLUCOSE, CAPILLARY
GLUCOSE-CAPILLARY: 326 mg/dL — AB (ref 70–99)
Glucose-Capillary: 226 mg/dL — ABNORMAL HIGH (ref 70–99)
Glucose-Capillary: 227 mg/dL — ABNORMAL HIGH (ref 70–99)

## 2017-12-21 LAB — URINE CULTURE

## 2017-12-21 MED ORDER — INSULIN GLARGINE 100 UNIT/ML ~~LOC~~ SOLN
45.0000 [IU] | Freq: Every day | SUBCUTANEOUS | Status: DC
  Filled 2017-12-21: qty 0.45

## 2017-12-21 MED ORDER — INSULIN ASPART 100 UNIT/ML ~~LOC~~ SOLN: 5.0000 [IU] | Freq: Three times a day (TID) | SUBCUTANEOUS | Status: DC

## 2017-12-21 MED ORDER — TICAGRELOR 90 MG PO TABS: 90.0000 mg | ORAL_TABLET | Freq: Two times a day (BID) | ORAL | 1 refills | Status: DC

## 2017-12-21 MED ORDER — ATORVASTATIN CALCIUM 80 MG PO TABS: 80.0000 mg | ORAL_TABLET | Freq: Every day | ORAL | 1 refills | Status: DC

## 2017-12-21 MED ORDER — METOPROLOL SUCCINATE ER 25 MG PO TB24: 12.5000 mg | ORAL_TABLET | Freq: Every day | ORAL | 1 refills | Status: DC

## 2017-12-21 NOTE — Progress Notes (Signed)
Inpatient Diabetes Program Recommendations  AACE/ADA: New Consensus Statement on Inpatient Glycemic Control (2015)  Target Ranges:  Prepandial:   less than 140 mg/dL      Peak postprandial:   less than 180 mg/dL (1-2 hours)      Critically ill patients:  140 - 180 mg/dL   Results for Renee Mack, Renee Mack (MRN [id number]) as of 12/21/2017 10:26  Ref. Range 12/19/2017 08:48 12/19/2017 12:06 12/19/2017 16:10 12/19/2017 21:30  Glucose-Capillary Latest Ref Range: 70 - 99 mg/dL 334 (H) 324 (H)  15 units NOVOLOG 191 (H)  10 units NOVOLOG 190 (H)   Results for Renee Mack, Renee Mack (MRN [id number]) as of 12/21/2017 10:26  Ref. Range 12/20/2017 07:23 12/20/2017 11:24 12/20/2017 16:26 12/20/2017 20:46 12/20/2017 21:52  Glucose-Capillary Latest Ref Range: 70 - 99 mg/dL 301 (H)  15 units NOVOLOG 194 (H)  4 units NOVOLOG +  30 units LANTUS at 9am 260 (H)  11 units NOVOLOG 262 (H) 358 (H)  5 units NOVOLOG    Results for Renee Mack, Renee Mack (MRN [id number]) as of 12/21/2017 10:26  Ref. Range 12/21/2017 08:05  Glucose-Capillary Latest Ref Range: 70 - 99 mg/dL 326 (H)  15 units NOVOLOG +  30 units LANTUS at 9am     Home DM Meds: Lantus 60 units QHS       Novolog 10 units TID with meals        Novolog SSI  Current Insulin Orders: Lantus 30 units daily      Novolog Resistant Correction Scale/ SSI (0-20 units) TID AC + HS      MD- Please consider the following in-hospital insulin adjustments:  1. Increase Lantus to 45 units daily (75% total home dose)--Please consider giving an extra 15 units Lantus this AM since 30 units Lantus already given at 9am.  2. Reduce Novolog SSI to Sensitive scale (0-9 units) TID AC + HS  3. Start Novolog Meal Coverage: Novolog 5 units TID with meals (Please add the following Hold Parameters: Hold if pt eats <50% of meal, Hold if pt NPO)      --Will follow patient during hospitalization--  Wyn Quaker RN, MSN, CDE Diabetes Coordinator Inpatient Glycemic Control Team Team  Pager: [phone] (8a-5p)

## 2017-12-21 NOTE — Discharge Summary (Signed)
[facility] NAME: Renee Mack    MR#:  [id number]  DATE OF BIRTH:  12/14/1965  DATE OF ADMISSION:  12/19/2017 ADMITTING PHYSICIAN: Renee Lighter, MD  DATE OF DISCHARGE: 12/21/2017  PRIMARY CARE PHYSICIAN: Ro, [name], MD    ADMISSION DIAGNOSIS:  Chest Pains  DISCHARGE DIAGNOSIS:  Active Problems:   Hyperlipidemia LDL goal <70   Acute ST elevation myocardial infarction (STEMI) involving left anterior descending (LAD) coronary artery (HCC)   Acute ST elevation myocardial infarction (STEMI) of anterior wall (HCC)   Ischemic cardiomyopathy   Acute systolic heart failure (HCC)   Cardiogenic shock (HCC)   Tobacco abuse   SECONDARY DIAGNOSIS:   Past Medical History:  Diagnosis Date  . Cancer ([place])    vulvular  . COPD (chronic obstructive pulmonary disease) (HCC)    not on home oxygen  . Diabetes mellitus without complication ([place])   . GERD (gastroesophageal reflux disease)   . HLD (hyperlipidemia)   . Pancreatitis   . Shingles     HOSPITAL COURSE:   Renee Mack a52 y.o.femalewith a known history of insulin-dependent diabetes mellitus, vulvar cancer status post surgery, [name], hyperlipidemia, ongoing smoking presents to hospital secondary to chest tightness.  1. STEMI-patient presented to the hospital with chest pain and noted to have a ST elevation MI.  This was anterior wall MI and patient was urgently rushed to the cardiac catheterization lab.  Patient underwent 3 stents to the LAD. - Post cardiac catheterization patient has been chest pain-free.  Patient post cardiac catheterization was somewhat hypotensive and required a little bit of vasopressors but those have been weaned off now. - Patient has not been able to tolerate any beta-blocker but was finally able to take a small dose of carvedilol on the morning of discharge. -Patient presently will be discharged on aspirin, Brilinta but she is  being changed over from carvedilol to low-dose Toprol along with atorvastatin.  Patient will follow-up with cardiology next week. - Patient's ejection fraction was significantly low down to 30% and therefore she was arranged for a CHF/AICD vest upon discharge.  2. DM Type II -severely uncontrolled due to her noncompliance.  Patient's hemoglobin A1c was as high as 12. - Diabetes coordinator consult was obtained.  Patient was maintained on some Lantus, sliding scale insulin.  She was advised to resume her Lantus at her correct dose along with her NovoLog with meals.  She should follow-up with her primary care physician regarding her diabetes management.  3.  Tobacco abuse- while in the hospital pt. Was on continue nicotine patch.  4.  Essential hypertension- pt. Was discharged on some low dose Toprol.   5.  Depression- she will continue Celexa.  6. COPD - no acute exacerbation. Pt. Will resume her albuterol inhaler as needed.     DISCHARGE CONDITIONS:   Stable  CONSULTS OBTAINED:  Treatment Team:  Renee Hampshire, MD  DRUG ALLERGIES:   Allergies  Allergen Reactions  . Bee Venom Itching, Shortness Of Breath and Swelling  . Metformin And Related Shortness Of Breath  . Darvon [Propoxyphene] Itching  . Gabapentin Swelling  . Nsaids Other (See Comments)    Ulcers   . Tramadol Hives    DISCHARGE MEDICATIONS:   Allergies as of 12/21/2017      Reactions   Bee Venom Itching, Shortness Of Breath, Swelling   Metformin And Related Shortness Of Breath   Darvon [propoxyphene] Itching  Gabapentin Swelling   Nsaids Other (See Comments)   Ulcers   Tramadol Hives      Medication List    STOP taking these medications   HYDROcodone-acetaminophen 5-325 MG tablet Commonly known as:  NORCO/VICODIN   insulin detemir 100 UNIT/ML injection Commonly known as:  LEVEMIR   insulin lispro 100 UNIT/ML injection Commonly known as:  HUMALOG   ondansetron 4 MG disintegrating  tablet Commonly known as:  ZOFRAN ODT   phenazopyridine 200 MG tablet Commonly known as:  PYRIDIUM   protein supplement shake Liqd Commonly known as:  PREMIER PROTEIN   sucralfate 1 g tablet Commonly known as:  CARAFATE     TAKE these medications   acetaminophen 325 MG tablet Commonly known as:  TYLENOL Take 650 mg by mouth every 6 (six) hours as needed for mild pain.   aspirin 81 MG tablet Take 81 mg by mouth daily.   atorvastatin 80 MG tablet Commonly known as:  LIPITOR Take 1 tablet (80 mg total) by mouth daily at 6 PM.   cephALEXin 500 MG capsule Commonly known as:  KEFLEX Take 1 capsule (500 mg total) by mouth 4 (four) times daily for 10 days.   citalopram 20 MG tablet Commonly known as:  CELEXA Take 20 mg daily by mouth.   insulin aspart 100 UNIT/ML injection Commonly known as:  novoLOG Inject 10 Units into the skin 3 (three) times daily before meals. Take 10 units PLUS sliding scale   insulin glargine 100 UNIT/ML injection Commonly known as:  LANTUS Inject 60 Units into the skin at bedtime.   LORazepam 1 MG tablet Commonly known as:  ATIVAN Take 1 mg by mouth every 8 (eight) hours. What changed:  Another medication with the same name was removed. Continue taking this medication, and follow the directions you see here.   metoprolol succinate 25 MG 24 hr tablet Commonly known as:  TOPROL XL Take 0.5 tablets (12.5 mg total) by mouth daily.   PROAIR HFA 108 (90 Base) MCG/ACT inhaler Generic drug:  albuterol Inhale 1 puff into the lungs every 6 (six) hours as needed for wheezing or shortness of breath.   ticagrelor 90 MG Tabs tablet Commonly known as:  BRILINTA Take 1 tablet (90 mg total) by mouth 2 (two) times daily.         DISCHARGE INSTRUCTIONS:   DIET:  Cardiac diet/Carbohydrate control DM diet.   DISCHARGE CONDITION:  Stable  ACTIVITY:  Activity as tolerated  OXYGEN:  Home Oxygen: No.   Oxygen Delivery: room air  DISCHARGE  LOCATION:  home   If you experience worsening of your admission symptoms, develop shortness of breath, life threatening emergency, suicidal or homicidal thoughts you must seek medical attention immediately by calling 911 or calling your MD immediately  if symptoms less severe.  You Must read complete instructions/literature along with all the possible adverse reactions/side effects for all the Medicines you take and that have been prescribed to you. Take any new Medicines after you have completely understood and accpet all the possible adverse reactions/side effects.   Please note  You were cared for by a hospitalist during your hospital stay. If you have any questions about your discharge medications or the care you received while you were in the hospital after you are discharged, you can call the unit and asked to speak with the hospitalist on call if the hospitalist that took care of you is not available. Once you are discharged, your primary care physician will  handle any further medical issues. Please note that NO REFILLS for any discharge medications will be authorized once you are discharged, as it is imperative that you return to your primary care physician (or establish a relationship with a primary care physician if you do not have one) for your aftercare needs so that they can reassess your need for medications and monitor your lab values.     Today   No chest pain, shortness of breath overnight.  Patient ambulated and was not orthostatic.  Will discharge home and follow-up with cardiology in the next week or so.  VITAL SIGNS:  Blood pressure (!) 89/55, pulse 73, temperature 98 F (36.7 C), temperature source Oral, resp. rate 17, height 5\' 1"  (1.549 m), weight 61.2 kg (135 lb), SpO2 96 %.  I/O:    Intake/Output Summary (Last 24 hours) at 12/21/2017 1501 Last data filed at 12/21/2017 1411 Gross per 24 hour  Intake 480 ml  Output 600 ml  Net -120 ml    PHYSICAL EXAMINATION:    GENERAL:  52 y.o.-year-old patient sitting up in chair in no acute distress.  EYES: Pupils equal, round, reactive to light and accommodation. No scleral icterus. Extraocular muscles intact.  HEENT: Head atraumatic, normocephalic. Oropharynx and nasopharynx clear.  NECK:  Supple, no jugular venous distention. No thyroid enlargement, no tenderness.  LUNGS: Normal breath sounds bilaterally, no wheezing, rales, rhonchi. No use of accessory muscles of respiration.  CARDIOVASCULAR: S1, S2 normal. No murmurs, rubs, or gallops.  ABDOMEN: Soft, nontender, nondistended. Bowel sounds present. No organomegaly or mass.  EXTREMITIES: No cyanosis, clubbing or edema b/l.    NEUROLOGIC: Cranial nerves II through XII are intact. No focal Motor or sensory deficits b/l.   PSYCHIATRIC: The patient is alert and oriented x 3.  SKIN: No obvious rash, lesion, or ulcer.   DATA REVIEW:   CBC Recent Labs  Lab 12/20/17 0631  WBC 10.0  HGB 14.0  HCT 41.9  PLT 254    Chemistries  Recent Labs  Lab 12/20/17 0631  NA 135  K 4.2  CL 103  CO2 21*  GLUCOSE 332*  BUN 14  CREATININE 0.40*  CALCIUM 8.7*  MG 1.8    Cardiac Enzymes Recent Labs  Lab 12/19/17 2156  TROPONINI 2.41*    Microbiology Results  Results for orders placed or performed during the hospital encounter of 12/19/17  MRSA PCR Screening     Status: None   Collection Time: 12/19/17  8:41 AM  Result Value Ref Range Status   MRSA by PCR NEGATIVE NEGATIVE Final    Comment:        The GeneXpert MRSA Assay (FDA approved for NASAL specimens only), is one component of a comprehensive MRSA colonization surveillance program. It is not intended to diagnose MRSA infection nor to guide or monitor treatment for MRSA infections. Performed at [facility], [address], [place], [place] [address]   Urine Culture     Status: Abnormal   Collection Time: 12/19/17  1:13 PM  Result Value Ref Range Status   Specimen Description    Final    URINE, RANDOM Performed at [facility], [address] [place], [place] [address]    Special Requests   Final    NONE Performed at [facility], [address] [place], [place] [address]    Culture (A)  Final    <10,000 COLONIES/mL INSIGNIFICANT GROWTH Performed at [facility] [address], [place], [place] [address]  Report Status 12/21/2017 FINAL  Final    RADIOLOGY:  No results found.    Management plans discussed with the patient, family and they are in agreement.  CODE STATUS:     Code Status Orders  (From admission, onward)        Start     Ordered   12/19/17 0839  Full code  Continuous     12/19/17 0838     TOTAL TIME TAKING CARE OF THIS PATIENT: 40 minutes.    Henreitta Leber M.D on 12/21/2017 at 3:01 PM  Between 7am to 6pm - Pager - [phone]  After 6pm go to [email or web address] - Proofreader  [facility]  Office  [phone]  CC: Primary care physician; Carolyn Stare [name], MD

## 2017-12-21 NOTE — Discharge Instructions (Signed)

## 2017-12-21 NOTE — Telephone Encounter (Signed)
Patient being discharged today. TCM call- 12/23/17

## 2017-12-21 NOTE — Progress Notes (Signed)
Progress Note  Patient Name: Renee Mack Date of Encounter: 12/21/2017  Primary Cardiologist: New - [facility]  Subjective   No further chest pain. No SOB. Awaiting LifeVest fitting. No labs this morning. Transferred out of the ICU 7/2. BP remains soft in the 80s/60s.   Inpatient Medications    Scheduled Meds: . aspirin EC  81 mg Oral Daily  . atorvastatin  80 mg Oral q1800  . carvedilol  3.125 mg Oral BID WC  . citalopram  20 mg Oral Daily  . enoxaparin (LOVENOX) injection  40 mg Subcutaneous Q24H  . insulin aspart  0-20 Units Subcutaneous TID WC  . insulin aspart  0-5 Units Subcutaneous QHS  . insulin glargine  30 Units Subcutaneous Daily  . LORazepam  1 mg Oral Q8H  . nicotine  14 mg Transdermal Daily  . sodium chloride flush  3 mL Intravenous Q12H  . ticagrelor  90 mg Oral BID   Continuous Infusions: . sodium chloride     PRN Meds: sodium chloride, acetaminophen, ipratropium-albuterol, nitroGLYCERIN, ondansetron (ZOFRAN) IV, oxyCODONE-acetaminophen, sodium chloride flush   Vital Signs    Vitals:   12/20/17 1400 12/20/17 1957 12/20/17 2000 12/21/17 0327  BP: (!) 85/61  (!) 86/59 (!) 88/65  Pulse: 75  69 68  Resp: 17  16 17   Temp:   (!) 97.5 F (36.4 C) (!) 97.4 F (36.3 C)  TempSrc:   Oral Oral  SpO2: 95%  92% 97%  Weight:  134 lb 9.6 oz (61.1 kg)  135 lb (61.2 kg)  Height:  5\' 1"  (1.549 m)      Intake/Output Summary (Last 24 hours) at 12/21/2017 0710 Last data filed at 12/21/2017 0000 Gross per 24 hour  Intake 240 ml  Output 1150 ml  Net -910 ml   Filed Weights   12/19/17 0839 12/20/17 1957 12/21/17 0327  Weight: 134 lb 0.6 oz (60.8 kg) 134 lb 9.6 oz (61.1 kg) 135 lb (61.2 kg)    Telemetry    NSR - Personally Reviewed  ECG    n/a - Personally Reviewed  Physical Exam   GEN: No acute distress.   Neck: No JVD. Cardiac: RRR, no murmurs, rubs, or gallops. Right radial cath site intact without hematoma. Radial pulse 2+. Respiratory: Clear to  auscultation bilaterally.  GI: Soft, nontender, non-distended.   MS: No edema; No deformity. Neuro:  Alert and oriented x 3; Nonfocal.  Psych: Normal affect.  Labs    Chemistry Recent Labs  Lab 12/19/17 0626 12/19/17 0934 12/20/17 0631  NA 133*  --  135  K 4.1  --  4.2  CL 98  --  103  CO2 23  --  21*  GLUCOSE 410*  --  332*  BUN 14  --  14  CREATININE 0.54 0.51 0.40*  CALCIUM 9.4  --  8.7*  GFRNONAA >60 >60 >60  GFRAA >60 >60 >60  ANIONGAP 12  --  11     Hematology Recent Labs  Lab 12/19/17 0626 12/19/17 0934 12/20/17 0631  WBC 10.8 12.7* 10.0  RBC 5.00 4.52 4.38  HGB 16.1* 14.3 14.0  HCT 48.1* 43.0 41.9  MCV 96.2 95.0 95.7  MCH 32.3 31.5 31.9  MCHC 33.5 33.2 33.4  RDW 13.9 13.7 13.9  PLT 312 317 254    Cardiac Enzymes Recent Labs  Lab 12/19/17 0626 12/19/17 0934 12/19/17 1711 12/19/17 2156  TROPONINI 1.29* 3.23*  3.38* 2.64* 2.41*   No results for input(s): TROPIPOC in the  last 168 hours.   BNPNo results for input(s): BNP, PROBNP in the last 168 hours.   DDimer No results for input(s): DDIMER in the last 168 hours.   Radiology    No results found.  Cardiac Studies    TTE (12/19/17): - Left ventricle: The cavity size was normal. There was mild concentric hypertrophy. Systolic function was moderately to severely reduced. The estimated ejection fraction was in the range of 30% to 35%. Akinesis of the mid-apicalanteroseptal, anterior, and apical myocardium. Doppler parameters are consistent with abnormal left ventricular relaxation (grade 1 diastolic dysfunction).  LHC/PCI (12/19/17): 1. Severe one-vessel coronary artery disease with thrombotic occlusion of the proximal LAD and significant disease in the mid and distal LAD. Left dominant system. Significant proximal stenosis in a nondominant right coronary artery. 2. Moderately to severely reduced LV systolic function with an EF of 30% with akinesis of the mid to distal anterior  and apical myocardium. 3. Moderately elevated left ventricular end-diastolic pressure at 26 mmHg. 4. Successful angioplasty and 3 drug-eluting stent placement to the LAD to establish TIMI-3 flow.   Patient Profile     52 y.o. female with history of IDDM, vulvar cancer s/p surgery, COPD secondary to tobacco abuse, HTN, and HLD, admitted with late-presenting anterior STEMI s/p primary PCI to LAD.  Assessment & Plan    1. Anterior STEMI: -Late presentation based on history (duration of symptoms), EKG findings, angiographic appearance, and only mild troponin elevation -Chest pain free -Continue DAPT with ASA and Briltina for at least the next 12 months -Aggressive risk factor modification and secondary prevention -Cardiac rehab -Ambulate -Post-cath instructions  2. ICM with acute systolic CHF and cardiogenic shock: -Weaned off NE as of 7/1 -She does not appear grossly volume up at this time -Order for LifeVest has been signed and consult has been placed with case Freight forwarder. LifeVest will need to be fitted prior to discharge -BP precludes escalation of evidence-based heart failure therapy, titrate as able -She will need a repeat echo in ~ 3 months, if EF remains < 35% at that time she will need referral to EP for ICD -Continue Coreg 3.125 mg bid as BP allows -Not on ACEi/ARB/Entresto/spironolactone given soft BP  3. HLD: -Goal LDL < 70 -Continue Lipitor 80 mg daily -Recheck fasting lipid and LFT in ~ 8 weeks  4. COPD secondary to tobacco abuse: -Cessation advised   For questions or updates, please contact [place] Please consult [email or web address] for contact info under Cardiology/STEMI.    Signed, Christell Faith, PA-C [place] Pager: [phone] 12/21/2017, 7:10 AM

## 2017-12-21 NOTE — Telephone Encounter (Signed)
TCM....  Patient is being discharged   They saw R. Dunn   They are scheduled to see Dr. Fletcher Anon on 8/15  They were seen for NSTEMI  They need to be seen within 1 week   Pt added to wait list   Please call

## 2017-12-23 NOTE — Telephone Encounter (Signed)
Left voicemail message to call back  

## 2017-12-26 ENCOUNTER — Encounter
Admission: EM | Disposition: A | Discharge status: Home / Self Care | Payer: Self-pay | Source: Home / Self Care | Attending: Internal Medicine

## 2017-12-26 ENCOUNTER — Inpatient Hospital Stay
Admission: EM | Admit: 2017-12-26 | Discharge: 2017-12-30 | DRG: 247 | Disposition: A | Discharge status: Home / Self Care | Payer: Medicaid Other | Attending: Internal Medicine | Admitting: Internal Medicine

## 2017-12-26 ENCOUNTER — Other Ambulatory Visit: Payer: Self-pay

## 2017-12-26 DIAGNOSIS — F17210 Nicotine dependence, cigarettes, uncomplicated: Secondary | ICD-10-CM | POA: Diagnosis present

## 2017-12-26 DIAGNOSIS — Z955 Presence of coronary angioplasty implant and graft: Secondary | ICD-10-CM | POA: Diagnosis not present

## 2017-12-26 DIAGNOSIS — Z8249 Family history of ischemic heart disease and other diseases of the circulatory system: Secondary | ICD-10-CM | POA: Diagnosis not present

## 2017-12-26 DIAGNOSIS — Z8544 Personal history of malignant neoplasm of other female genital organs: Secondary | ICD-10-CM

## 2017-12-26 DIAGNOSIS — Z886 Allergy status to analgesic agent status: Secondary | ICD-10-CM

## 2017-12-26 DIAGNOSIS — Z9081 Acquired absence of spleen: Secondary | ICD-10-CM

## 2017-12-26 DIAGNOSIS — Z794 Long term (current) use of insulin: Secondary | ICD-10-CM | POA: Diagnosis not present

## 2017-12-26 DIAGNOSIS — I5022 Chronic systolic (congestive) heart failure: Secondary | ICD-10-CM

## 2017-12-26 DIAGNOSIS — I209 Angina pectoris, unspecified: Secondary | ICD-10-CM

## 2017-12-26 DIAGNOSIS — I252 Old myocardial infarction: Secondary | ICD-10-CM | POA: Diagnosis not present

## 2017-12-26 DIAGNOSIS — Z79899 Other long term (current) drug therapy: Secondary | ICD-10-CM | POA: Diagnosis not present

## 2017-12-26 DIAGNOSIS — J449 Chronic obstructive pulmonary disease, unspecified: Secondary | ICD-10-CM | POA: Diagnosis present

## 2017-12-26 DIAGNOSIS — Z91030 Bee allergy status: Secondary | ICD-10-CM

## 2017-12-26 DIAGNOSIS — Z7982 Long term (current) use of aspirin: Secondary | ICD-10-CM

## 2017-12-26 DIAGNOSIS — R079 Chest pain, unspecified: Secondary | ICD-10-CM | POA: Diagnosis present

## 2017-12-26 DIAGNOSIS — I5042 Chronic combined systolic (congestive) and diastolic (congestive) heart failure: Secondary | ICD-10-CM | POA: Diagnosis present

## 2017-12-26 DIAGNOSIS — E119 Type 2 diabetes mellitus without complications: Secondary | ICD-10-CM | POA: Diagnosis present

## 2017-12-26 DIAGNOSIS — I255 Ischemic cardiomyopathy: Secondary | ICD-10-CM | POA: Diagnosis present

## 2017-12-26 DIAGNOSIS — Z888 Allergy status to other drugs, medicaments and biological substances status: Secondary | ICD-10-CM

## 2017-12-26 DIAGNOSIS — K219 Gastro-esophageal reflux disease without esophagitis: Secondary | ICD-10-CM | POA: Diagnosis present

## 2017-12-26 DIAGNOSIS — I220 Subsequent ST elevation (STEMI) myocardial infarction of anterior wall: Secondary | ICD-10-CM | POA: Diagnosis present

## 2017-12-26 DIAGNOSIS — R42 Dizziness and giddiness: Secondary | ICD-10-CM | POA: Diagnosis not present

## 2017-12-26 DIAGNOSIS — R200 Anesthesia of skin: Secondary | ICD-10-CM | POA: Diagnosis not present

## 2017-12-26 DIAGNOSIS — I214 Non-ST elevation (NSTEMI) myocardial infarction: Secondary | ICD-10-CM

## 2017-12-26 DIAGNOSIS — I213 ST elevation (STEMI) myocardial infarction of unspecified site: Secondary | ICD-10-CM | POA: Diagnosis present

## 2017-12-26 DIAGNOSIS — E785 Hyperlipidemia, unspecified: Secondary | ICD-10-CM | POA: Diagnosis present

## 2017-12-26 DIAGNOSIS — I25110 Atherosclerotic heart disease of native coronary artery with unstable angina pectoris: Secondary | ICD-10-CM | POA: Diagnosis present

## 2017-12-26 DIAGNOSIS — I200 Unstable angina: Secondary | ICD-10-CM | POA: Diagnosis present

## 2017-12-26 HISTORY — DX: Ischemic cardiomyopathy: I25.5

## 2017-12-26 HISTORY — PX: LEFT HEART CATH AND CORONARY ANGIOGRAPHY: CATH118249

## 2017-12-26 HISTORY — DX: Tobacco use: Z72.0

## 2017-12-26 HISTORY — DX: Chronic combined systolic (congestive) and diastolic (congestive) heart failure: I50.42

## 2017-12-26 HISTORY — PX: CORONARY STENT INTERVENTION: CATH118234

## 2017-12-26 HISTORY — DX: Atherosclerotic heart disease of native coronary artery without angina pectoris: I25.10

## 2017-12-26 HISTORY — DX: Acute myocardial infarction, unspecified: I21.9

## 2017-12-26 LAB — PROTIME-INR
INR: 0.98
Prothrombin Time: 12.9 seconds (ref 11.4–15.2)

## 2017-12-26 LAB — CBC
HCT: 38.4 % (ref 35.0–47.0)
Hemoglobin: 13.5 g/dL (ref 12.0–16.0)
MCH: 32.7 pg (ref 26.0–34.0)
MCHC: 35.1 g/dL (ref 32.0–36.0)
MCV: 93 fL (ref 80.0–100.0)
PLATELETS: 287 10*3/uL (ref 150–440)
RBC: 4.13 MIL/uL (ref 3.80–5.20)
RDW: 13.9 % (ref 11.5–14.5)
WBC: 10.4 10*3/uL (ref 3.6–11.0)

## 2017-12-26 LAB — COMPREHENSIVE METABOLIC PANEL
ALT: 18 U/L (ref 0–44)
AST: 18 U/L (ref 15–41)
Albumin: 3.4 g/dL — ABNORMAL LOW (ref 3.5–5.0)
Alkaline Phosphatase: 96 U/L (ref 38–126)
Anion gap: 9 (ref 5–15)
BUN: 15 mg/dL (ref 6–20)
CHLORIDE: 105 mmol/L (ref 98–111)
CO2: 24 mmol/L (ref 22–32)
CREATININE: 0.64 mg/dL (ref 0.44–1.00)
Calcium: 9 mg/dL (ref 8.9–10.3)
GFR calc Af Amer: >60 mL/min (ref >60)
GFR calc non Af Amer: >60 mL/min (ref >60)
Glucose, Bld: 304 mg/dL — ABNORMAL HIGH (ref 70–99)
Potassium: 3.9 mmol/L (ref 3.5–5.1)
SODIUM: 138 mmol/L (ref 135–145)
Total Bilirubin: 0.4 mg/dL (ref 0.3–1.2)
Total Protein: 7.1 g/dL (ref 6.5–8.1)

## 2017-12-26 LAB — TROPONIN I
TROPONIN I: 0.84 ng/mL — AB (ref <0.03)
TROPONIN I: 1.05 ng/mL — AB (ref <0.03)
Troponin I: 0.56 ng/mL (ref <0.03)

## 2017-12-26 LAB — GLUCOSE, CAPILLARY
GLUCOSE-CAPILLARY: 268 mg/dL — AB (ref 70–99)
GLUCOSE-CAPILLARY: 279 mg/dL — AB (ref 70–99)
Glucose-Capillary: 284 mg/dL — ABNORMAL HIGH (ref 70–99)
Glucose-Capillary: 285 mg/dL — ABNORMAL HIGH (ref 70–99)
Glucose-Capillary: 224 mg/dL — ABNORMAL HIGH (ref 70–99)

## 2017-12-26 LAB — POCT ACTIVATED CLOTTING TIME: Activated Clotting Time: 246 seconds

## 2017-12-26 LAB — APTT: aPTT: 27 seconds (ref 24–36)

## 2017-12-26 SURGERY — LEFT HEART CATH AND CORONARY ANGIOGRAPHY
Anesthesia: Moderate Sedation

## 2017-12-26 MED ORDER — ONDANSETRON HCL 4 MG/2ML IJ SOLN
4.0000 mg | Freq: Four times a day (QID) | INTRAMUSCULAR | Status: DC | PRN
  Administered 2017-12-30: 4 mg via INTRAVENOUS
  Filled 2017-12-26: qty 2

## 2017-12-26 MED ORDER — OXYCODONE-ACETAMINOPHEN 5-325 MG PO TABS
2.0000 | ORAL_TABLET | Freq: Four times a day (QID) | ORAL | Status: DC | PRN
  Administered 2017-12-26 – 2017-12-30 (×14): 2 via ORAL
  Filled 2017-12-26 (×15): qty 2

## 2017-12-26 MED ORDER — SODIUM CHLORIDE 0.9 % IV SOLN
INTRAVENOUS | Status: DC
  Administered 2017-12-26: 13:00:00 via INTRAVENOUS

## 2017-12-26 MED ORDER — HEPARIN SODIUM (PORCINE) 1000 UNIT/ML IJ SOLN
INTRAMUSCULAR | Status: DC | PRN
  Administered 2017-12-26: 2000 [IU] via INTRAVENOUS
  Administered 2017-12-26: 3000 [IU] via INTRAVENOUS
  Administered 2017-12-26: 4000 [IU] via INTRAVENOUS

## 2017-12-26 MED ORDER — NITROGLYCERIN 1 MG/10 ML FOR IR/CATH LAB
INTRA_ARTERIAL | Status: DC | PRN
  Administered 2017-12-26: 100 ug via INTRACORONARY

## 2017-12-26 MED ORDER — NITROGLYCERIN 0.4 MG SL SUBL: 0.4000 mg | SUBLINGUAL_TABLET | SUBLINGUAL | Status: DC | PRN

## 2017-12-26 MED ORDER — INSULIN GLARGINE 100 UNIT/ML ~~LOC~~ SOLN
60.0000 [IU] | Freq: Every day | SUBCUTANEOUS | Status: DC
  Administered 2017-12-26 – 2017-12-29 (×4): 60 [IU] via SUBCUTANEOUS
  Filled 2017-12-26 (×5): qty 0.6

## 2017-12-26 MED ORDER — LORAZEPAM 1 MG PO TABS
1.0000 mg | ORAL_TABLET | Freq: Three times a day (TID) | ORAL | Status: DC
  Administered 2017-12-26 – 2017-12-30 (×13): 1 mg via ORAL
  Filled 2017-12-26 (×13): qty 1

## 2017-12-26 MED ORDER — INSULIN ASPART 100 UNIT/ML ~~LOC~~ SOLN
0.0000 [IU] | Freq: Three times a day (TID) | SUBCUTANEOUS | Status: DC
  Administered 2017-12-26: 5 [IU] via SUBCUTANEOUS
  Administered 2017-12-27 – 2017-12-28 (×5): 2 [IU] via SUBCUTANEOUS
  Administered 2017-12-29: 5 [IU] via SUBCUTANEOUS
  Administered 2017-12-29: 3 [IU] via SUBCUTANEOUS
  Administered 2017-12-29: 2 [IU] via SUBCUTANEOUS
  Administered 2017-12-30: 7 [IU] via SUBCUTANEOUS
  Administered 2017-12-30: 5 [IU] via SUBCUTANEOUS
  Filled 2017-12-26 (×8): qty 1

## 2017-12-26 MED ORDER — TICAGRELOR 90 MG PO TABS
90.0000 mg | ORAL_TABLET | Freq: Two times a day (BID) | ORAL | Status: DC
  Administered 2017-12-26 – 2017-12-30 (×8): 90 mg via ORAL
  Filled 2017-12-26 (×9): qty 1

## 2017-12-26 MED ORDER — NITROGLYCERIN 5 MG/ML IV SOLN
INTRAVENOUS | Status: AC
  Filled 2017-12-26: qty 10

## 2017-12-26 MED ORDER — HEPARIN (PORCINE) IN NACL 1000-0.9 UT/500ML-% IV SOLN
INTRAVENOUS | Status: AC
  Filled 2017-12-26: qty 1000

## 2017-12-26 MED ORDER — VERAPAMIL HCL 2.5 MG/ML IV SOLN
INTRAVENOUS | Status: AC
  Filled 2017-12-26: qty 2

## 2017-12-26 MED ORDER — MIDAZOLAM HCL 2 MG/2ML IJ SOLN
INTRAMUSCULAR | Status: AC
  Filled 2017-12-26: qty 2

## 2017-12-26 MED ORDER — HEPARIN (PORCINE) IN NACL 100-0.45 UNIT/ML-% IJ SOLN
700.0000 [IU]/h | INTRAMUSCULAR | Status: DC
  Administered 2017-12-26: 700 [IU]/h via INTRAVENOUS
  Filled 2017-12-26: qty 250

## 2017-12-26 MED ORDER — HEPARIN SODIUM (PORCINE) 1000 UNIT/ML IJ SOLN
INTRAMUSCULAR | Status: AC
Start: 2017-12-26 — End: ?
  Filled 2017-12-26: qty 1

## 2017-12-26 MED ORDER — ALBUTEROL SULFATE (2.5 MG/3ML) 0.083% IN NEBU: 2.5000 mg | INHALATION_SOLUTION | Freq: Four times a day (QID) | RESPIRATORY_TRACT | Status: DC | PRN

## 2017-12-26 MED ORDER — SODIUM CHLORIDE 0.9% FLUSH
3.0000 mL | Freq: Two times a day (BID) | INTRAVENOUS | Status: DC
  Administered 2017-12-26 – 2017-12-29 (×7): 3 mL via INTRAVENOUS

## 2017-12-26 MED ORDER — ASPIRIN EC 81 MG PO TBEC
81.0000 mg | DELAYED_RELEASE_TABLET | Freq: Every day | ORAL | Status: DC
  Administered 2017-12-26 – 2017-12-30 (×5): 81 mg via ORAL
  Filled 2017-12-26 (×9): qty 1

## 2017-12-26 MED ORDER — SODIUM CHLORIDE 0.9% FLUSH: 3.0000 mL | INTRAVENOUS | Status: DC | PRN

## 2017-12-26 MED ORDER — DOCUSATE SODIUM 100 MG PO CAPS
100.0000 mg | ORAL_CAPSULE | Freq: Two times a day (BID) | ORAL | Status: DC | PRN
  Filled 2017-12-26: qty 1

## 2017-12-26 MED ORDER — ALBUTEROL SULFATE HFA 108 (90 BASE) MCG/ACT IN AERS: 1.0000 | INHALATION_SPRAY | Freq: Four times a day (QID) | RESPIRATORY_TRACT | Status: DC | PRN

## 2017-12-26 MED ORDER — TIROFIBAN HCL IN NACL 5-0.9 MG/100ML-% IV SOLN
INTRAVENOUS | Status: AC | PRN
  Administered 2017-12-26: 0.15 ug/kg/min via INTRAVENOUS

## 2017-12-26 MED ORDER — ACETAMINOPHEN 325 MG PO TABS
650.0000 mg | ORAL_TABLET | Freq: Four times a day (QID) | ORAL | Status: DC | PRN
  Administered 2017-12-28: 650 mg via ORAL
  Filled 2017-12-26 (×2): qty 2

## 2017-12-26 MED ORDER — SODIUM CHLORIDE 0.9% FLUSH: 3.0000 mL | Freq: Two times a day (BID) | INTRAVENOUS | Status: DC

## 2017-12-26 MED ORDER — SODIUM CHLORIDE 0.9 % IV SOLN: 250.0000 mL | INTRAVENOUS | Status: DC | PRN

## 2017-12-26 MED ORDER — METOPROLOL SUCCINATE ER 25 MG PO TB24
12.5000 mg | ORAL_TABLET | Freq: Every day | ORAL | Status: DC
  Filled 2017-12-26 (×3): qty 0.5

## 2017-12-26 MED ORDER — TIROFIBAN HCL IN NACL 5-0.9 MG/100ML-% IV SOLN
INTRAVENOUS | Status: AC
  Filled 2017-12-26: qty 100

## 2017-12-26 MED ORDER — SODIUM CHLORIDE 0.9 % IV SOLN
1000.0000 mL | Freq: Once | INTRAVENOUS | Status: AC
Start: 2017-12-26 — End: 2017-12-26
  Administered 2017-12-26: 1000 mL via INTRAVENOUS

## 2017-12-26 MED ORDER — INSULIN ASPART 100 UNIT/ML ~~LOC~~ SOLN
SUBCUTANEOUS | Status: AC
  Filled 2017-12-26: qty 1

## 2017-12-26 MED ORDER — ACETAMINOPHEN 325 MG PO TABS
650.0000 mg | ORAL_TABLET | ORAL | Status: DC | PRN
  Administered 2017-12-26 – 2017-12-28 (×2): 650 mg via ORAL
  Filled 2017-12-26: qty 2

## 2017-12-26 MED ORDER — FENTANYL CITRATE (PF) 100 MCG/2ML IJ SOLN
INTRAMUSCULAR | Status: AC
  Filled 2017-12-26: qty 2

## 2017-12-26 MED ORDER — HEPARIN BOLUS VIA INFUSION
3600.0000 [IU] | Freq: Once | INTRAVENOUS | Status: AC
  Administered 2017-12-26: 3600 [IU] via INTRAVENOUS
  Filled 2017-12-26: qty 3600

## 2017-12-26 MED ORDER — ATORVASTATIN CALCIUM 20 MG PO TABS
80.0000 mg | ORAL_TABLET | Freq: Every day | ORAL | Status: DC
  Administered 2017-12-26 – 2017-12-29 (×4): 80 mg via ORAL
  Filled 2017-12-26 (×4): qty 4

## 2017-12-26 MED ORDER — TIROFIBAN (AGGRASTAT) BOLUS VIA INFUSION
INTRAVENOUS | Status: DC | PRN
  Administered 2017-12-26: 1530 ug via INTRAVENOUS

## 2017-12-26 MED ORDER — TIROFIBAN HCL IN NACL 5-0.9 MG/100ML-% IV SOLN
0.1500 ug/kg/min | INTRAVENOUS | Status: AC
  Filled 2017-12-26: qty 100

## 2017-12-26 MED ORDER — ENOXAPARIN SODIUM 40 MG/0.4ML ~~LOC~~ SOLN
40.0000 mg | SUBCUTANEOUS | Status: DC
  Administered 2017-12-27 – 2017-12-30 (×4): 40 mg via SUBCUTANEOUS
  Filled 2017-12-26 (×3): qty 0.4

## 2017-12-26 MED ORDER — FENTANYL CITRATE (PF) 100 MCG/2ML IJ SOLN
INTRAMUSCULAR | Status: DC | PRN
  Administered 2017-12-26: 25 ug via INTRAVENOUS

## 2017-12-26 MED ORDER — CITALOPRAM HYDROBROMIDE 20 MG PO TABS
20.0000 mg | ORAL_TABLET | Freq: Every day | ORAL | Status: DC
  Administered 2017-12-26 – 2017-12-30 (×5): 20 mg via ORAL
  Filled 2017-12-26 (×5): qty 1

## 2017-12-26 MED ORDER — LIDOCAINE HCL (PF) 1 % IJ SOLN
INTRAMUSCULAR | Status: AC
  Filled 2017-12-26: qty 30

## 2017-12-26 SURGICAL SUPPLY — 20 items
BALLN MINITREK RX 2.0X20 (BALLOONS) ×3
BALLN ~~LOC~~ EUPHORA RX 2.5X20 (BALLOONS) ×3
BALLN ~~LOC~~ TREK RX 2.5X8 (BALLOONS) ×3
BALLOON MINITREK RX 2.0X20 (BALLOONS) ×1 IMPLANT
BALLOON ~~LOC~~ EUPHORA RX 2.5X20 (BALLOONS) ×1 IMPLANT
BALLOON ~~LOC~~ TREK RX 2.5X8 (BALLOONS) ×1 IMPLANT
CATH INFINITI 5FR TG (CATHETERS) ×3 IMPLANT
CATH LAUNCHER 6FR EBU 4 (CATHETERS) IMPLANT
CATH LAUNCHER 6FR JL4 (CATHETERS) ×3 IMPLANT
CATH VISTA GUIDE 6FR XBLAD4 (CATHETERS) ×3 IMPLANT
DEVICE INFLAT 30 PLUS (MISCELLANEOUS) ×3 IMPLANT
DEVICE RAD TR BAND REGULAR (VASCULAR PRODUCTS) ×3 IMPLANT
KIT MANI 3VAL PERCEP (MISCELLANEOUS) ×3 IMPLANT
NEEDLE PERC 21GX4CM (NEEDLE) ×3 IMPLANT
PACK CARDIAC CATH (CUSTOM PROCEDURE TRAY) ×3 IMPLANT
SHEATH RAIN RADIAL 21G 6FR (SHEATH) ×6 IMPLANT
STENT RESOLUTE ONYX 2.0X18 (Permanent Stent) ×3 IMPLANT
WIRE HI TORQ WHISPER MS 190CM (WIRE) ×3 IMPLANT
WIRE INTUITION PROPEL ST 180CM (WIRE) ×3 IMPLANT
WIRE ROSEN-J .035X260CM (WIRE) ×3 IMPLANT

## 2017-12-26 NOTE — Telephone Encounter (Signed)
No answer. Left message to call back.   

## 2017-12-26 NOTE — Progress Notes (Signed)
Family Meeting Note  Advance Directive:no  Today a meeting took place with the Patient.  The following clinical team members were present during this meeting:MD  The following were discussed:Patient's diagnosis: CAD, CHF, Patient's progosis: Unable to determine and Goals for treatment: Full Code  Pt wants her Daughter to make medical decisions for her, if she Can't. She had not thought about resussitation till now, I encouraged to make some decision and let her daughter know. Meanwhile full code.  Additional follow-up to be provided: Crdiology  Time spent during discussion:20 minutes  Vaughan Basta, MD

## 2017-12-26 NOTE — ED Triage Notes (Signed)
Pt admitted via ems with STEMI. CP that started at 4am from right chest radiating to right arm, rating 6/10. Hx of MI 2 days ago with 95% blockage. Pt wearing heart monitor with external defibrillator. Ems vital 111/62, 75, 96% on RA. 2 nitro given, 4 zofran, and 39mcg fentanyl.

## 2017-12-26 NOTE — Consult Note (Signed)
PULMONARY / CRITICAL CARE MEDICINE   Name: Renee Mack MRN: [id number] DOB: June 25, 1965    ADMISSION DATE:  12/26/2017   CONSULTATION DATE:  12/26/17  REFERRING MD:  Dr Sharolyn Douglas  REASON: STEMI S/P PCI with stent to the LAD  HISTORY OF PRESENT ILLNESS:   52 year old female with a history of CAD and previous MI with stent placement to the LAD who presented to the ED with complaints of chest pain that started at 4 AM.  She was ruled in for a STEMI and taken to the Cath Lab immediately with a repeat PCI to the LAD with stent placement.  She is now status post cath and remains moderately hypotensive mean arterial blood pressures fluctuating between 54 and 65.  She denies chest pain but reports back and neck pain that is chronic.  PAST MEDICAL HISTORY :  She  has a past medical history of Cancer ([place]), Chronic combined systolic (congestive) and diastolic (congestive) heart failure ([place]), COPD (chronic obstructive pulmonary disease) ([place]), Coronary artery disease, Diabetes mellitus without complication ([place]), GERD (gastroesophageal reflux disease), HLD (hyperlipidemia), Ischemic cardiomyopathy, Myocardial infarction ([place]), Pancreatitis, Shingles, and Tobacco abuse.  PAST SURGICAL HISTORY: She  has a past surgical history that includes Lymphadenectomy; vulvulasectomy; Splenectomy, partial; Abdominal hysterectomy; Ectopic pregnancy surgery (Left); Coronary/Graft Acute MI Revascularization (N/A, 12/19/2017); and LEFT HEART CATH AND CORONARY ANGIOGRAPHY (N/A, 12/19/2017).  Allergies  Allergen Reactions  . Bee Venom Itching, Shortness Of Breath and Swelling  . Metformin And Related Shortness Of Breath  . Darvon [Propoxyphene] Itching  . Gabapentin Swelling  . Nsaids Other (See Comments)    Ulcers   . Tramadol Hives    No current facility-administered medications on file prior to encounter.    Current Outpatient Medications on File Prior to Encounter  Medication Sig  . acetaminophen (TYLENOL) 325 MG  tablet Take 650 mg by mouth every 6 (six) hours as needed for mild pain.   Marland Kitchen albuterol (PROAIR HFA) 108 (90 Base) MCG/ACT inhaler Inhale 1 puff into the lungs every 6 (six) hours as needed for wheezing or shortness of breath.   Marland Kitchen aspirin 81 MG tablet Take 81 mg by mouth daily.  Marland Kitchen atorvastatin (LIPITOR) 80 MG tablet Take 1 tablet (80 mg total) by mouth daily at 6 PM.  . citalopram (CELEXA) 20 MG tablet Take 20 mg daily by mouth.  . insulin aspart (NOVOLOG) 100 UNIT/ML injection Inject 10 Units into the skin 3 (three) times daily before meals. Take 10 units PLUS sliding scale  . insulin glargine (LANTUS) 100 UNIT/ML injection Inject 60 Units into the skin at bedtime.  Marland Kitchen LORazepam (ATIVAN) 1 MG tablet Take 1 mg by mouth every 8 (eight) hours.  . metoprolol succinate (TOPROL XL) 25 MG 24 hr tablet Take 0.5 tablets (12.5 mg total) by mouth daily.  . ticagrelor (BRILINTA) 90 MG TABS tablet Take 1 tablet (90 mg total) by mouth 2 (two) times daily.    FAMILY HISTORY:  Her indicated that her mother is deceased. She indicated that her father is deceased. She indicated that the status of her sister is unknown. She indicated that the status of her brother is unknown.   SOCIAL HISTORY: She  reports that she has quit smoking. Her smoking use included cigarettes. She has a 15.00 pack-year smoking history. She has never used smokeless tobacco. She reports that she does not drink alcohol or use drugs.  REVIEW OF SYSTEMS:   Constitutional: Negative for fever and chills.  HENT: Negative for congestion and  rhinorrhea.  Eyes: Negative for redness and visual disturbance.  Respiratory: Negative for shortness of breath and wheezing.  Cardiovascular: Positive for mild chest pain that is significantly improved from her initial presentation but negative for palpitations.  Gastrointestinal: Negative  for nausea , vomiting and abdominal pain and  Loose stools Genitourinary: Negative for dysuria and urgency.   Endocrine: Denies polyuria, polyphagia and heat intolerance Musculoskeletal: Positive for back and neck pain.  Skin: Negative for pallor and wound.  Neurological: Negative for dizziness and headaches   SUBJECTIVE:   VITAL SIGNS: BP (!) 84/53 (BP Location: Right Arm)   Pulse 68   Temp 97.7 F (36.5 C) (Oral)   Resp 14   Ht 5\' 1"  (1.549 m)   Wt 135 lb (61.2 kg)   SpO2 91%   BMI 25.51 kg/m   HEMODYNAMICS:    VENTILATOR SETTINGS:    INTAKE / OUTPUT: I/O last 3 completed shifts: In: 381.8 [I.V.:381.8] Out: 450 [Urine:450]  PHYSICAL EXAMINATION: General: No acute distress Neuro: Alert and oriented x4, cranial nerves intact HEENT: PERRLA, no JVD Cardiovascular: Pulse regular, S1-S2, no murmur regurg or gallop, +2 pulses, no edema Lungs: Clear to auscultation bilaterally with diminished breath sounds in the bases Abdomen: Normal bowel sounds x4 Musculoskeletal: Positive range of motion no joint deformities Skin: Warm and dry  LABS:  BMET Recent Labs  Lab 12/20/17 0631 12/26/17 1112  NA 135 138  K 4.2 3.9  CL 103 105  CO2 21* 24  BUN 14 15  CREATININE 0.40* 0.64  GLUCOSE 332* 304*    Electrolytes Recent Labs  Lab 12/20/17 0631 12/26/17 1112  CALCIUM 8.7* 9.0  MG 1.8  --   PHOS 3.4  --     CBC Recent Labs  Lab 12/20/17 0631 12/26/17 1112  WBC 10.0 10.4  HGB 14.0 13.5  HCT 41.9 38.4  PLT 254 287    Coag's Recent Labs  Lab 12/26/17 1112  APTT 27  INR 0.98    Sepsis Markers No results for input(s): LATICACIDVEN, PROCALCITON, O2SATVEN in the last 168 hours.  ABG No results for input(s): PHART, PCO2ART, PO2ART in the last 168 hours.  Liver Enzymes Recent Labs  Lab 12/26/17 1112  AST 18  ALT 18  ALKPHOS 96  BILITOT 0.4  ALBUMIN 3.4*    Cardiac Enzymes Recent Labs  Lab 12/19/17 2156 12/26/17 1112 12/26/17 1733  TROPONINI 2.41* 0.56* 0.84*    Glucose Recent Labs  Lab 12/21/17 1118 12/21/17 1616 12/26/17 1121  12/26/17 1301 12/26/17 1537 12/26/17 1635  GLUCAP 226* 227* 284* 285* 224* 268*    Imaging No results found.   STUDIES:  CORONARY STENT INTERVENTION  LEFT HEART CATH AND CORONARY ANGIOGRAPHY  Conclusion     Dist LAD lesion is 10% stenosed.  Balloon angioplasty was performed.  Prox Cx lesion is 30% stenosed.  Prox RCA lesion is 90% stenosed.  Prox LAD to Mid LAD lesion is 100% stenosed.  Mid LAD lesion is 70% stenosed.  A drug-eluting stent was successfully placed using a STENT RESOLUTE ONYX 2.0X18.  Post intervention, there is a 15% residual stenosis.  Post intervention, there is a 10% residual stenosis.  Balloon angioplasty was performed using a BALLOON [place] [employer] RX2.5X20.  Post intervention, there is a 0% residual stenosis.  Ost 2nd Diag to 2nd Diag lesion is 90% stenosed.   1.  Subacute stent thrombosis in the LAD likely due to poor outflow related to previous late presentation of anterior myocardial infarction as well  as long segments of stents.  Collaterals are noted from the right coronary artery to the LAD. 2.  Moderately elevated left ventricular end-diastolic pressure. 3.  Successful very difficult angioplasty and drug-eluting stent placement to the LAD as outlined above.    DISCUSSION: 52 year old female presenting with recurrent ST elevation MI with multiple cardiovascular risk factors that include tobacco use, hyperlipidemia, and type 2 diabetes  ASSESSMENT  ST elevation MI status post PCI with stent to the LAD Type 2 diabetes mellitus Hyperlipidemia Previous MI Combined systolic and diastolic heart failure- last EF 30 to 35% in July 2019  PLAN Treatment plan per cardiology Hemodynamic monitoring per ICU protocol Gentle IV hydration and pressors to maintain mean arterial blood pressure greater than 65 Smoking cessation advice given DVT and GI prophylaxis  FAMILY  - Updates: Patient updated on current treatment plan Onita Pfluger S. [facility]  ANP-BC Pulmonary and Critical Care Medicine [facility] Pager [phone] or [phone]  NB: This document was prepared using Dragon voice recognition software and may include unintentional dictation errors.   12/26/2017, 9:20 PM

## 2017-12-26 NOTE — Progress Notes (Signed)
Chaplain received an OR to complete an AD for the patient, whom she had met upon arrival to the ED for a CODE STEMI this morning. Patient underwent procedure after the failure of stents, which were placed last week. Patient was alert, positive, and exhibited strong faith in her conversation and references to having come this far by faith. Chaplain educated patient on AD, but needed to leave to respond to an urgent page to the ED for a CODE Stroke. Chaplain will return to complete.    12/26/17 1800  Clinical Encounter Type  Visited With Patient  Visit Type Follow-up  Referral From Physician  Spiritual Encounters  Spiritual Needs Emotional

## 2017-12-26 NOTE — Progress Notes (Signed)
   12/26/17 1445  Clinical Encounter Type  Visited With Patient not available;Health care provider  Visit Type Follow-up (order for advanced directive)  Referral From Physician  Consult/Referral To Chaplain   Chaplain responded to order regarding advanced directive.  Per cath lab staff, patient is still undergoing procedure and will be under influence of medications even after procedure.  Chaplain to check in with patient after she has transferred to a unit.  Will leave order open until patient has been seen.

## 2017-12-26 NOTE — Progress Notes (Signed)
Chaplain received a PG for a Code STEMI to the patient's room in the ER. Upon arrival, medical team was doing a workup of the patient who was awake, alert and able to answer questions appropriately. Chaplain entered the room and waited while medical team made assessment. Chaplain awaited completion of assessment then talked with patient about what brought her to the ER, family, her life, and God. Patient described recently challenging life, which has recently included the death of her mother and husband. Patient is a person of faith with a vibrant personality and loving spirt.  She is a person of strength despite the difficult circumstances and she attributes that to having hope and a perspective that reminds her it's always bigger than her. Patient and chaplain prayed together and chaplain provided emotional support before departing. Thankfully, the patient's status was downgraded via page while with patient.     12/26/17 1200  Clinical Encounter Type  Visited With Patient  Visit Type Code  Spiritual Encounters  Spiritual Needs Prayer;Emotional

## 2017-12-26 NOTE — Progress Notes (Signed)
Patient's site began to bleed at radial site. 3cc's air added to band. New total is 9cc.

## 2017-12-26 NOTE — Progress Notes (Signed)
Chaplain returned to complete education for the AD, but patient was resting. Will make chaplain coming on-call tomorrow aware of the need to complete.    12/26/17 2100  Clinical Encounter Type  Visited With Patient not available  Visit Type Follow-up

## 2017-12-26 NOTE — ED Provider Notes (Signed)
[facility] Emergency Department Provider Note   ____________________________________________    I have reviewed the triage vital signs and the nursing notes.   HISTORY  Chief Complaint Code STEMI     HPI Renee Mack is a 52 y.o. female who presents with complaints of chest pain.  Patient has a history of COPD, diabetes, status post 3 stents placed 1 week ago by Dr. Fletcher Anon.  Patient reports she developed acute onset of moderate to severe pain this morning approximately 1 hour prior to arrival which radiates into her left arm.  She describes pressure-like discomfort.  Denies shortness of breath.  EMS called a code STEMI   Past Medical History:  Diagnosis Date  . Cancer ([place])    vulvular  . COPD (chronic obstructive pulmonary disease) ([facility])    not on home oxygen  . Diabetes mellitus without complication ([place])   . GERD (gastroesophageal reflux disease)   . HLD (hyperlipidemia)   . Pancreatitis   . Shingles     Patient Active Problem List   Diagnosis Date Noted  . Acute ST elevation myocardial infarction (STEMI) of anterior wall ([place])   . Ischemic cardiomyopathy   . Acute systolic heart failure ([place])   . Cardiogenic shock ([place])   . Tobacco abuse   . Acute ST elevation myocardial infarction (STEMI) involving left anterior descending (LAD) coronary artery ([place]) 12/19/2017  . Protein-calorie malnutrition, severe 07/18/2017  . DKA (diabetic ketoacidoses) ([place]) 07/17/2017  . Acute ischemic enteritis ([place]) 06/12/2017  . Pyelonephritis 12/28/2014  . Type 2 diabetes mellitus ([place]) 12/28/2014  . COPD (chronic obstructive pulmonary disease) ([place]) 12/28/2014  . GERD (gastroesophageal reflux disease) 12/28/2014  . History of shingles 12/28/2014  . Hyperlipidemia LDL goal <70 12/28/2014    Past Surgical History:  Procedure Laterality Date  . ABDOMINAL HYSTERECTOMY     partial  . CORONARY/GRAFT ACUTE MI REVASCULARIZATION N/A 12/19/2017   Procedure:  Coronary/Graft Acute MI Revascularization;  Surgeon: Wellington Hampshire, MD;  Location: [facility];  Service: Cardiovascular;  Laterality: N/A;  . ECTOPIC PREGNANCY SURGERY Left   . LEFT HEART CATH AND CORONARY ANGIOGRAPHY N/A 12/19/2017   Procedure: LEFT HEART CATH AND CORONARY ANGIOGRAPHY;  Surgeon: Wellington Hampshire, MD;  Location: [facility];  Service: Cardiovascular;  Laterality: N/A;  . LYMPHADENECTOMY    . SPLENECTOMY, PARTIAL    . vulvulasectomy      Prior to Admission medications   Medication Sig Start Date End Date Taking? Authorizing Provider  acetaminophen (TYLENOL) 325 MG tablet Take 650 mg by mouth every 6 (six) hours as needed for mild pain.    Yes [provider]  albuterol (PROAIR HFA) 108 (90 Base) MCG/ACT inhaler Inhale 1 puff into the lungs every 6 (six) hours as needed for wheezing or shortness of breath.    Yes [provider]  aspirin 81 MG tablet Take 81 mg by mouth daily.   Yes [provider]  atorvastatin (LIPITOR) 80 MG tablet Take 1 tablet (80 mg total) by mouth daily at 6 PM. 12/21/17 02/19/18 Yes Sainani, Belia Heman, MD  citalopram (CELEXA) 20 MG tablet Take 20 mg daily by mouth.   Yes [provider]  insulin aspart (NOVOLOG) 100 UNIT/ML injection Inject 10 Units into the skin 3 (three) times daily before meals. Take 10 units PLUS sliding scale   Yes [provider]  insulin glargine (LANTUS) 100 UNIT/ML injection Inject 60 Units into the skin at bedtime.  Yes [provider]  LORazepam (ATIVAN) 1 MG tablet Take 1 mg by mouth every 8 (eight) hours.   Yes [provider]  metoprolol succinate (TOPROL XL) 25 MG 24 hr tablet Take 0.5 tablets (12.5 mg total) by mouth daily. 12/21/17 02/19/18 Yes Henreitta Leber, MD  ticagrelor (BRILINTA) 90 MG TABS tablet Take 1 tablet (90 mg total) by mouth 2 (two) times daily. 12/21/17 02/19/18 Yes Henreitta Leber, MD     Allergies Bee venom; Metformin and  related; Darvon [propoxyphene]; Gabapentin; Nsaids; and Tramadol  Family History  Problem Relation Age of Onset  . Cancer Mother   . Osteoporosis Sister   . Heart disease Brother   . Heart attack Father     Social History Social History   Tobacco Use  . Smoking status: Current Every Day Smoker    Packs/day: 0.50    Years: 30.00    Pack years: 15.00    Types: Cigarettes  . Smokeless tobacco: Never Used  Substance Use Topics  . Alcohol use: No  . Drug use: No    Review of Systems  Constitutional: No fever/chills Eyes: No visual changes.  ENT: No sore throat. Cardiovascular: As above Respiratory: Denies shortness of breath. Gastrointestinal: No abdominal pain.  No nausea, no vomiting.  Genitourinary: Negative for dysuria. Musculoskeletal: Negative for back pain. Skin: Negative for rash. Neurological: Negative for headaches or weakness   ____________________________________________   PHYSICAL EXAM:  VITAL SIGNS: ED Triage Vitals  Enc Vitals Group     BP 12/26/17 1108 (!) 99/57     Pulse Rate 12/26/17 1109 96     Resp 12/26/17 1108 18     Temp 12/26/17 1109 98.1 F (36.7 C)     Temp Source 12/26/17 1109 Oral     SpO2 12/26/17 1109 93 %     Weight 12/26/17 1110 61.2 kg (135 lb)     Height 12/26/17 1110 1.549 m (5\' 1" )     Head Circumference --      Peak Flow --      Pain Score 12/26/17 1110 5     Pain Loc --      Pain Edu? --      Excl. in [place]? --     Constitutional: Alert and oriented.  Anxious him on comfort Eyes: Conjunctivae are normal.   Nose: No congestion/rhinnorhea. Mouth/Throat: Mucous membranes are moist.    Cardiovascular: Normal rate, regular rhythm. Grossly normal heart sounds.  Good peripheral circulation. Respiratory: Normal respiratory effort.  No retractions. Lungs CTAB. Gastrointestinal: Soft and nontender. No distention.    Musculoskeletal: No lower extremity tenderness nor edema.  Warm and well perfused Neurologic:  Normal speech  and language. No gross focal neurologic deficits are appreciated.  Skin:  Skin is warm, dry and intact. No rash noted. Psychiatric: Mood and affect are normal. Speech and behavior are normal.  ____________________________________________   LABS (all labs ordered are listed, but only abnormal results are displayed)  Labs Reviewed  GLUCOSE, CAPILLARY - Abnormal; Notable for the following components:      Result Value   Glucose-Capillary 284 (*)    All other components within normal limits  CBC  APTT  PROTIME-INR  COMPREHENSIVE METABOLIC PANEL  TROPONIN I  HEPARIN LEVEL (UNFRACTIONATED)   ____________________________________________  EKG  ED ECG REPORT I, Lavonia Drafts, the attending physician, personally viewed and interpreted this ECG.  Date: 12/26/2017  Rhythm: normal sinus rhythm QRS Axis: normal Intervals: normal ST/T Wave abnormalities: Nonspecific ST  changes V2 V3 Narrative Interpretation: Does not meet STEMI criteria  ED ECG REPORT I, Lavonia Drafts, the attending physician, personally viewed and interpreted this ECG.  Date: 12/26/2017 Second EKG  Rhythm: normal sinus rhythm QRS Axis: normal Intervals: normal ST/T Wave abnormalities: Nonspecific changes Narrative Interpretation: Does not meet STEMI criteria    ____________________________________________  RADIOLOGY  None ____________________________________________   PROCEDURES  Procedure(s) performed: No  Procedures   Critical Care performed: yes  CRITICAL CARE Performed by: Lavonia Drafts   Total critical care time: 30 minutes  Critical care time was exclusive of separately billable procedures and treating other patients.  Critical care was necessary to treat or prevent imminent or life-threatening deterioration.  Critical care was time spent personally by me on the following activities: development of treatment plan with patient and/or surrogate as well as nursing, discussions with  consultants, evaluation of patient's response to treatment, examination of patient, obtaining history from patient or surrogate, ordering and performing treatments and interventions, ordering and review of laboratory studies, ordering and review of radiographic studies, pulse oximetry and re-evaluation of patient's condition.  ____________________________________________   INITIAL IMPRESSION / ASSESSMENT AND PLAN / ED COURSE  Pertinent labs & imaging results that were available during my care of the patient were reviewed by me and considered in my medical decision making (see chart for details).  Patient presents as a code STEMI,  with active chest pain upon arrival but nonspecific ST changes on EKG,  Dr. Fletcher Anon cardiology came to the ED to see the patient and agrees that does not meet STEMI criteria however recommends heparin for possible unstable angina.  Recommends admission to medicine.    ____________________________________________   FINAL CLINICAL IMPRESSION(S) / ED DIAGNOSES  Final diagnoses:  Unstable angina ([place])        Note:  This document was prepared using Dragon voice recognition software and may include unintentional dictation errors.    Lavonia Drafts, MD 12/26/17 1153

## 2017-12-26 NOTE — Progress Notes (Signed)
[place] for [place]gr[place] Indication: s/p cardiac catheterization  Allergies  Allergen Reactions  . Bee Venom Itching, Shortness Of Breath and Swelling  . Metformin And Related Shortness Of Breath  . Darvon [Propoxyphene] Itching  . Gabapentin Swelling  . Nsaids Other (See Comments)    Ulcers   . Tramadol Hives    Patient Measurements: Height: 5\' 1"  (154.9 cm) Weight: 135 lb (61.2 kg) IBW/kg (Calculated) : 47.8  Vital Signs: Temp: 98.1 F (36.7 C) (07/08 1301) Temp Source: Oral (07/08 1301) BP: 96/76 (07/08 1615) Pulse Rate: 76 (07/08 1615) Intake/Output from previous day: No intake/output data recorded. Intake/Output from this shift: No intake/output data recorded.  Labs: Recent Labs    12/26/17 1112  WBC 10.4  HGB 13.5  HCT 38.4  PLT 287  APTT 27  CREATININE 0.64  ALBUMIN 3.4*  PROT 7.1  AST 18  ALT 18  ALKPHOS 96  BILITOT 0.4   Estimated Creatinine Clearance: 67.5 mL/min (by C-G formula based on SCr of 0.64 mg/dL).   Microbiology: Recent Results (from the past 720 hour(s))  Urine culture     Status: Abnormal   Collection Time: 12/11/17 11:18 PM  Result Value Ref Range Status   Specimen Description   Final    URINE, RANDOM Performed at [facility], [address] [place], [place] [address]    Special Requests   Final    NONE Performed at [facility], [address] [place], [place] [address]    Culture MULTIPLE SPECIES PRESENT, SUGGEST RECOLLECTION (A)  Final   Report Status 12/13/2017 FINAL  Final  MRSA PCR Screening     Status: None   Collection Time: 12/19/17  8:41 AM  Result Value Ref Range Status   MRSA by PCR NEGATIVE NEGATIVE Final    Comment:        The GeneXpert MRSA Assay (FDA approved for NASAL specimens only), is one component of a comprehensive MRSA colonization surveillance program. It is not intended to diagnose MRSA infection nor to guide or monitor  treatment for MRSA infections. Performed at [facility], [address], [place], [place] [address]   Urine Culture     Status: Abnormal   Collection Time: 12/19/17  1:13 PM  Result Value Ref Range Status   Specimen Description   Final    URINE, RANDOM Performed at [facility], [address], [place], [place] [address]    Special Requests   Final    NONE Performed at [facility], [address] [place], [place] [address]    Culture (A)  Final    <10,000 COLONIES/mL INSIGNIFICANT GROWTH Performed at [facility] [address], [place], [place] [address]    Report Status 12/21/2017 FINAL  Final    Medical History: Past Medical History:  Diagnosis Date  . Cancer ([place])    vulvular  . Chronic combined systolic (congestive) and diastolic (congestive) heart failure ([place])    a. 12/2017 Echo: EF 30-35%, mid-apicalanteroseptal, ant, and apical AK. Gr1 DD. Mild conc LVH.  Marland Kitchen COPD (chronic obstructive pulmonary disease) (HCC)    not on home oxygen  . Coronary artery disease    a. 12/2017 ACS/PCI: LM nl, LAD 100p (2.25x26 Resolute Onyx DES), 32m (2.0x12 Resolute Onyx DES), 80d (2.0x15 Resolute Onyx DES), LCX 30p, RCA 90p (non-dominant). EF 25-35%. Post-MI course complicated by cardiogenic shock req vasopressor Rx.  . Diabetes mellitus without complication ([place])   .  GERD (gastroesophageal reflux disease)   . HLD (hyperlipidemia)   . Ischemic cardiomyopathy    a. 12/2017 Echo: EF 30-35%.  . Myocardial infarction ([place])    a. 12/2017-->DES to LAD x 3.  . Pancreatitis   . Shingles   . Tobacco abuse     Medications:  Scheduled:  . aspirin EC  81 mg Oral Daily  . atorvastatin  80 mg Oral q1800  . citalopram  20 mg Oral Daily  . [START ON 12/27/2017] enoxaparin (LOVENOX) injection  40 mg Subcutaneous Q24H  . insulin aspart  0-9 Units Subcutaneous TID WC  . insulin glargine  60 Units Subcutaneous QHS  . LORazepam  1 mg Oral Q8H  . metoprolol  succinate  12.5 mg Oral Daily  . sodium chloride flush  3 mL Intravenous Q12H  . ticagrelor  90 mg Oral BID    Assessment: Patient's calculated creatinine clearance is > 68ml/min, therefore appropriate dose of Aggrastat is 0.15 mcg/kg/min. This dose was started in the cath lab and I have spoken to her nurse who feels there is enough drug remaining to complete the order of continuing 6 hours post-PCI  Plan:  PCI was completed at approximately 1500, therefore will maintain the current order of 9.18 mcg/min and schedule to discontinue at 2100.  Dallie Piles, PharmD 12/26/2017,4:55 PM

## 2017-12-26 NOTE — H&P (Signed)
[facility] [place] NAME: Renee Mack    MR#:  [id number]  DATE OF BIRTH:  September 19, 1965  DATE OF ADMISSION:  12/26/2017  PRIMARY CARE PHYSICIAN: Ro, [name], MD   REQUESTING/REFERRING PHYSICIAN: [email or web address]  CHIEF COMPLAINT:   Chief Complaint  Patient presents with  . Code STEMI    HISTORY OF PRESENT ILLNESS: Renee Mack  is a 52 y.o. female with a known history of Vulvar cancer, Ch combined sys+ diast CHF, CAD- with recent STEMI and LAD stent last week, COPD, DM, HLD, Smoker- Quit last week with CAD. She is compliant to meds since last week, started having pressure like chest pain since morning 5 am. Came to ER. Denies SOB or cough. Troponin slightly high ( Since last week), No EKG changes. ER spoke to [facility] to start on heparine drip and plan for cath.  PAST MEDICAL HISTORY:   Past Medical History:  Diagnosis Date  . Cancer ([place])    vulvular  . Chronic combined systolic (congestive) and diastolic (congestive) heart failure ([place])    a. 12/2017 Echo: EF 30-35%, mid-apicalanteroseptal, ant, and apical AK. Gr1 DD. Mild conc LVH.  Marland Kitchen COPD (chronic obstructive pulmonary disease) (HCC)    not on home oxygen  . Coronary artery disease    a. 12/2017 ACS/PCI: LM nl, LAD 100p (2.25x26 Resolute Onyx DES), 26m (2.0x12 Resolute Onyx DES), 80d (2.0x15 Resolute Onyx DES), LCX 30p, RCA 90p (non-dominant). EF 25-35%. Post-MI course complicated by cardiogenic shock req vasopressor Rx.  . Diabetes mellitus without complication ([place])   . GERD (gastroesophageal reflux disease)   . HLD (hyperlipidemia)   . Ischemic cardiomyopathy    a. 12/2017 Echo: EF 30-35%.  . Myocardial infarction ([place])    a. 12/2017-->DES to LAD x 3.  . Pancreatitis   . Shingles   . Tobacco abuse     PAST SURGICAL HISTORY:  Past Surgical History:  Procedure Laterality Date  . ABDOMINAL HYSTERECTOMY     partial  . CORONARY/GRAFT ACUTE MI REVASCULARIZATION N/A 12/19/2017   Procedure: Coronary/Graft Acute MI Revascularization;  Surgeon: Wellington Hampshire, MD;  Location: [facility];  Service: Cardiovascular;  Laterality: N/A;  . ECTOPIC PREGNANCY SURGERY Left   . LEFT HEART CATH AND CORONARY ANGIOGRAPHY N/A 12/19/2017   Procedure: LEFT HEART CATH AND CORONARY ANGIOGRAPHY;  Surgeon: Wellington Hampshire, MD;  Location: [facility];  Service: Cardiovascular;  Laterality: N/A;  . LYMPHADENECTOMY    . SPLENECTOMY, PARTIAL    . vulvulasectomy      SOCIAL HISTORY:  Social History   Tobacco Use  . Smoking status: Former Smoker    Packs/day: 0.50    Years: 30.00    Pack years: 15.00    Types: Cigarettes  . Smokeless tobacco: Never Used  . Tobacco comment: quit 12/19/2017.  Substance Use Topics  . Alcohol use: No    FAMILY HISTORY:  Family History  Problem Relation Age of Onset  . Cancer Mother   . Osteoporosis Sister   . Heart disease Brother   . Heart attack Father     DRUG ALLERGIES:  Allergies  Allergen Reactions  . Bee Venom Itching, Shortness Of Breath and Swelling  . Metformin And Related Shortness Of Breath  . Darvon [Propoxyphene] Itching  . Gabapentin Swelling  . Nsaids Other (See Comments)    Ulcers   . Tramadol Hives    REVIEW OF SYSTEMS:   CONSTITUTIONAL: No fever, fatigue or  weakness.  EYES: No blurred or double vision.  EARS, NOSE, AND THROAT: No tinnitus or ear pain.  RESPIRATORY: No cough, shortness of breath, wheezing or hemoptysis.  CARDIOVASCULAR: she have chest pain, no orthopnea, edema.  GASTROINTESTINAL: No nausea, vomiting, diarrhea or abdominal pain.  GENITOURINARY: No dysuria, hematuria.  ENDOCRINE: No polyuria, nocturia,  HEMATOLOGY: No anemia, easy bruising or bleeding SKIN: No rash or lesion. MUSCULOSKELETAL: No joint pain or arthritis.   NEUROLOGIC: No tingling, numbness, weakness.  PSYCHIATRY: No anxiety or depression.   MEDICATIONS AT HOME:  Prior to Admission medications   Medication Sig  Start Date End Date Taking? Authorizing Provider  acetaminophen (TYLENOL) 325 MG tablet Take 650 mg by mouth every 6 (six) hours as needed for mild pain.    Yes [provider]  albuterol (PROAIR HFA) 108 (90 Base) MCG/ACT inhaler Inhale 1 puff into the lungs every 6 (six) hours as needed for wheezing or shortness of breath.    Yes [provider]  aspirin 81 MG tablet Take 81 mg by mouth daily.   Yes [provider]  atorvastatin (LIPITOR) 80 MG tablet Take 1 tablet (80 mg total) by mouth daily at 6 PM. 12/21/17 02/19/18 Yes Sainani, Belia Heman, MD  citalopram (CELEXA) 20 MG tablet Take 20 mg daily by mouth.   Yes [provider]  insulin aspart (NOVOLOG) 100 UNIT/ML injection Inject 10 Units into the skin 3 (three) times daily before meals. Take 10 units PLUS sliding scale   Yes [provider]  insulin glargine (LANTUS) 100 UNIT/ML injection Inject 60 Units into the skin at bedtime.   Yes [provider]  LORazepam (ATIVAN) 1 MG tablet Take 1 mg by mouth every 8 (eight) hours.   Yes [provider]  metoprolol succinate (TOPROL XL) 25 MG 24 hr tablet Take 0.5 tablets (12.5 mg total) by mouth daily. 12/21/17 02/19/18 Yes Henreitta Leber, MD  ticagrelor (BRILINTA) 90 MG TABS tablet Take 1 tablet (90 mg total) by mouth 2 (two) times daily. 12/21/17 02/19/18 Yes Sainani, Belia Heman, MD      PHYSICAL EXAMINATION:   VITAL SIGNS: Blood pressure 97/60, pulse 73, temperature 98.1 F (36.7 C), temperature source Oral, resp. rate 18, height 5\' 1"  (1.549 m), weight 61.2 kg (135 lb), SpO2 96 %.  GENERAL:  52 y.o.-year-old patient lying in the bed with no acute distress.  EYES: Pupils equal, round, reactive to light and accommodation. No scleral icterus. Extraocular muscles intact.  HEENT: Head atraumatic, normocephalic. Oropharynx and nasopharynx clear.  NECK:  Supple, no jugular venous distention. No thyroid enlargement, no tenderness.  LUNGS: Normal breath  sounds bilaterally, no wheezing, rales,rhonchi or crepitation. No use of accessory muscles of respiration.  CARDIOVASCULAR: S1, S2 normal. No murmurs, rubs, or gallops.  ABDOMEN: Soft, nontender, nondistended. Bowel sounds present. No organomegaly or mass.  EXTREMITIES: No pedal edema, cyanosis, or clubbing.  NEUROLOGIC: Cranial nerves II through XII are intact. Muscle strength 5/5 in all extremities. Sensation intact. Gait not checked.  PSYCHIATRIC: The patient is alert and oriented x 3.  SKIN: No obvious rash, lesion, or ulcer.   LABORATORY PANEL:   CBC Recent Labs  Lab 12/20/17 0631 12/26/17 1112  WBC 10.0 10.4  HGB 14.0 13.5  HCT 41.9 38.4  PLT 254 287  MCV 95.7 93.0  MCH 31.9 32.7  MCHC 33.4 35.1  RDW 13.9 13.9   ------------------------------------------------------------------------------------------------------------------  Chemistries  Recent Labs  Lab 12/20/17 0631 12/26/17 1112  NA 135 138  K 4.2 3.9  CL 103 105  CO2 21* 24  GLUCOSE 332* 304*  BUN 14 15  CREATININE 0.40* 0.64  CALCIUM 8.7* 9.0  MG 1.8  --   AST  --  18  ALT  --  18  ALKPHOS  --  96  BILITOT  --  0.4   ------------------------------------------------------------------------------------------------------------------ estimated creatinine clearance is 67.5 mL/min (by C-G formula based on SCr of 0.64 mg/dL). ------------------------------------------------------------------------------------------------------------------ No results for input(s): TSH, T4TOTAL, T3FREE, THYROIDAB in the last 72 hours.  Invalid input(s): FREET3   Coagulation profile Recent Labs  Lab 12/26/17 1112  INR 0.98   ------------------------------------------------------------------------------------------------------------------- No results for input(s): DDIMER in the last 72 hours. -------------------------------------------------------------------------------------------------------------------  Cardiac  Enzymes Recent Labs  Lab 12/19/17 1711 12/19/17 2156 12/26/17 1112  TROPONINI 2.64* 2.41* 0.56*   ------------------------------------------------------------------------------------------------------------------ Invalid input(s): POCBNP  ---------------------------------------------------------------------------------------------------------------  Urinalysis    Component Value Date/Time   COLORURINE STRAW (A) 12/19/2017 1313   APPEARANCEUR CLEAR (A) 12/19/2017 1313   APPEARANCEUR Hazy 04/19/2014 0925   LABSPEC 1.036 (H) 12/19/2017 1313   LABSPEC 1.006 04/19/2014 0925   PHURINE 6.0 12/19/2017 1313   GLUCOSEU >=500 (A) 12/19/2017 1313   GLUCOSEU >=500 04/19/2014 0925   HGBUR NEGATIVE 12/19/2017 1313   BILIRUBINUR NEGATIVE 12/19/2017 1313   BILIRUBINUR Negative 04/19/2014 0925   KETONESUR 20 (A) 12/19/2017 1313   PROTEINUR NEGATIVE 12/19/2017 1313   NITRITE NEGATIVE 12/19/2017 1313   LEUKOCYTESUR TRACE (A) 12/19/2017 1313   LEUKOCYTESUR 3+ 04/19/2014 0925     RADIOLOGY: No results found.  EKG: Orders placed or performed during the hospital encounter of 12/26/17  . EKG 12-Lead  . EKG 12-Lead    IMPRESSION AND PLAN:  * Anginal chest pain with elevated troponin.   May be troponin from last week,s till coming down   Due to significant hx, cardio is planning for cath.   Cont Cardiac meds as recent stent, heparine drip.   Further plan after cath.  * Hyperlipidemia   Cont statin  * DM   Cont basal insuline, keep on ISS>  * Hypotension   BP is borderline, have Low EF and on low dose metoprolol since last week, need to decide, If cont or stop on d./c  * [facility] sysotolic + diastolic CHF   Monitor.  All the records are reviewed and case discussed with ED provider. Management plans discussed with the patient, family and they are in agreement.  CODE STATUS: Full. Code Status History    Date Active Date Inactive Code Status Order ID Comments User Context   12/19/2017  0839 12/21/2017 1940 Full Code [id number]  Wellington Hampshire, MD Inpatient   11/03/2017 1031 11/04/2017 1850 Full Code [id number]  Fritzi Mandes, MD Inpatient   09/28/2017 1044 09/29/2017 2121 Full Code [id number]  Harrie Foreman, MD ED   07/17/2017 2004 07/20/2017 1840 Full Code [id number]  Henreitta Leber, MD ED   06/12/2017 2227 06/16/2017 1910 Full Code [id number]  Salary, Avel Peace, MD Inpatient   12/28/2014 0305 12/28/2014 1919 Full Code [id number]  Lance Coon, MD Inpatient       TOTAL TIME TAKING CARE OF THIS PATIENT: 50 minutes.    Vaughan Basta M.D on 12/26/2017   Between 7am to 6pm - Pager - [phone]  After 6pm go to [email or web address] - password [email or web address] [facility]  Office  [phone]  CC: Primary care physician; Carolyn Stare [name], MD   Note: This dictation was prepared with Dragon dictation along with smaller  Company secretary. Any transcriptional errors that result from this process are unintentional.

## 2017-12-26 NOTE — ED Notes (Signed)
Cardiology (Dr. Fletcher Anon) at bedside.

## 2017-12-26 NOTE — Progress Notes (Signed)
ANTICOAGULATION CONSULT NOTE - Initial Consult  Pharmacy Consult for Heparin Drip Indication: chest pain/ACS  Allergies  Allergen Reactions  . Bee Venom Itching, Shortness Of Breath and Swelling  . Metformin And Related Shortness Of Breath  . Darvon [Propoxyphene] Itching  . Gabapentin Swelling  . Nsaids Other (See Comments)    Ulcers   . Tramadol Hives    Patient Measurements: Height: 5\' 1"  (154.9 cm) Weight: 135 lb (61.2 kg) IBW/kg (Calculated) : 47.8 Heparin Dosing Weight: 60.2 kg  Vital Signs: Temp: 98.1 F (36.7 C) (07/08 1301) Temp Source: Oral (07/08 1301) BP: 97/60 (07/08 1304) Pulse Rate: 73 (07/08 1301)  Labs: Recent Labs    12/26/17 1112  HGB 13.5  HCT 38.4  PLT 287  APTT 27  LABPROT 12.9  INR 0.98  CREATININE 0.64  TROPONINI 0.56*    Estimated Creatinine Clearance: 67.5 mL/min (by C-G formula based on SCr of 0.64 mg/dL).   Medical History: Past Medical History:  Diagnosis Date  . Cancer ([place])    vulvular  . Chronic combined systolic (congestive) and diastolic (congestive) heart failure ([place])    a. 12/2017 Echo: EF 30-35%, mid-apicalanteroseptal, ant, and apical AK. Gr1 DD. Mild conc LVH.  Marland Kitchen COPD (chronic obstructive pulmonary disease) (HCC)    not on home oxygen  . Coronary artery disease    a. 12/2017 ACS/PCI: LM nl, LAD 100p (2.25x26 Resolute Onyx DES), 95m (2.0x12 Resolute Onyx DES), 80d (2.0x15 Resolute Onyx DES), LCX 30p, RCA 90p (non-dominant). EF 25-35%. Post-MI course complicated by cardiogenic shock req vasopressor Rx.  . Diabetes mellitus without complication ([place])   . GERD (gastroesophageal reflux disease)   . HLD (hyperlipidemia)   . Ischemic cardiomyopathy    a. 12/2017 Echo: EF 30-35%.  . Myocardial infarction ([place])    a. 12/2017-->DES to LAD x 3.  . Pancreatitis   . Shingles   . Tobacco abuse     Medications:  Scheduled:  . sodium chloride flush  3 mL Intravenous Q12H   Infusions:  . sodium chloride    . sodium  chloride 75 mL/hr at 12/26/17 1311  . heparin Stopped (12/26/17 1311)    Assessment: 52 yo F to start Heparin Drip for ACS/STEMI. Patient on Brilinta PTA Hgb 13.5  Plt 287  INR 0.98  APTT 27   Goal of Therapy:  Heparin level 0.3-0.7 units/ml Monitor platelets by anticoagulation protocol: Yes   Plan:  Give 3600 units bolus x 1 Start heparin infusion at 700 units/hr Check anti-Xa level in 6 hours and daily while on heparin Continue to monitor H&H and platelets  Zaara Sprowl A 12/26/2017,1:22 PM

## 2017-12-26 NOTE — Progress Notes (Signed)
Inpatient Diabetes Program Recommendations  AACE/ADA: New Consensus Statement on Inpatient Glycemic Control (2015)  Target Ranges:  Prepandial:   less than 140 mg/dL      Peak postprandial:   less than 180 mg/dL (1-2 hours)      Critically ill patients:  140 - 180 mg/dL   Lab Results  Component Value Date   GLUCAP 285 (H) 12/26/2017   HGBA1C 12.1 (H) 12/19/2017    Review of Glycemic ControlResults for Renee Mack, Renee Mack (MRN [id number]) as of 12/26/2017 15:08  Ref. Range 12/26/2017 11:21 12/26/2017 13:01  Glucose-Capillary Latest Ref Range: 70 - 99 mg/dL 284 (H) 285 (H)   Home DM Meds: Lantus 60 units QHS                             Novolog 10 units TID with meals                              Novolog SSI  Current orders for Inpatient glycemic control:  Novolog sensitive tid with meals  Inpatient Diabetes Program Recommendations:    Note that patient is currently in cath lab. Upon transfer to floor, consider adding 1/2 of home basal insulin-Lantus 30 units q HS.  Also consider increasing frequency of Novolog to q 4 hours.   Thanks,  Adah Perl, RN, BC-ADM Inpatient Diabetes Coordinator Pager [phone] (8a-5p)

## 2017-12-26 NOTE — Consult Note (Signed)
Cardiology Consult    Patient ID: Renee Mack MRN: [id number], DOB/AGE: 1966/02/04   Admit date: 12/26/2017 Date of Consult: 12/26/2017  Primary Physician: Janalyn Shy, MD Primary Cardiologist: [name], MD Requesting Provider: Doylene Canning, mD  Patient Profile    Renee Mack is a 52 y.o. female with a history of CAD s/p recent MI with DES  LAD x 3, combined CHF/ICM w/ an EF of 30-35%, tob abuse, COPD, HL, GERD, DMII, and pancreatitis, who is being seen today for the evaluation of chest pain at the request of Dr. Anselm Jungling.  Past Medical History   Past Medical History:  Diagnosis Date  . Cancer ([place])    vulvular  . Chronic combined systolic (congestive) and diastolic (congestive) heart failure ([place])    a. 12/2017 Echo: EF 30-35%, mid-apicalanteroseptal, ant, and apical AK. Gr1 DD. Mild conc LVH.  Marland Kitchen COPD (chronic obstructive pulmonary disease) ([facility])    not on home oxygen  . Coronary artery disease    a. 12/2017 ACS/PCI: LM nl, LAD 100p (2.25x26 Resolute Onyx DES), 44m (2.0x12 Resolute Onyx DES), 80d (2.0x15 Resolute Onyx DES), LCX 30p, RCA 90p (non-dominant). EF 25-35%. Post-MI course complicated by cardiogenic shock req vasopressor Rx.  . Diabetes mellitus without complication ([place])   . GERD (gastroesophageal reflux disease)   . HLD (hyperlipidemia)   . Ischemic cardiomyopathy    a. 12/2017 Echo: EF 30-35%.  . Myocardial infarction ([place])    a. 12/2017-->DES to LAD x 3.  . Pancreatitis   . Shingles   . Tobacco abuse     Past Surgical History:  Procedure Laterality Date  . ABDOMINAL HYSTERECTOMY     partial  . CORONARY/GRAFT ACUTE MI REVASCULARIZATION N/A 12/19/2017   Procedure: Coronary/Graft Acute MI Revascularization;  Surgeon: Wellington Hampshire, MD;  Location: [place] [facility];  Service: Cardiovascular;  Laterality: N/A;  . ECTOPIC PREGNANCY SURGERY Left   . LEFT HEART CATH AND CORONARY ANGIOGRAPHY N/A 12/19/2017   Procedure: LEFT HEART CATH AND CORONARY ANGIOGRAPHY;   Surgeon: Wellington Hampshire, MD;  Location: [place] [facility];  Service: Cardiovascular;  Laterality: N/A;  . LYMPHADENECTOMY    . SPLENECTOMY, PARTIAL    . vulvulasectomy       Allergies  Allergies  Allergen Reactions  . Bee Venom Itching, Shortness Of Breath and Swelling  . Metformin And Related Shortness Of Breath  . Darvon [Propoxyphene] Itching  . Gabapentin Swelling  . Nsaids Other (See Comments)    Ulcers   . Tramadol Hives    History of Present Illness    52 y.o. female with a history of CAD s/p recent MI with DES  LAD x 3, combined CHF/ICM w/ an EF of 30-35%, tob abuse, COPD, HL, GERD, DMII, and pancreatitis.  She was recently admitted to [facility] with a several day h/o chest pain radiating into her neck and arm. She was found to have elevated troponins and dynamic ECG changes that did not exactly meet STEMI criteria.  She underwent cath revealing severe prox, mid, and distal LAD dzs along with severe dzs in a non-dominant RCA.  EF was 25-35% by ventriculography and post-PCI course was complicated by cardiogenic shock/hypotension, requiring vasopressor therapy.  She was eventually weaned off of norepi.  Echo confirmed LV dysfxn w/ EF of 30-35%.  She was eventually d/c'd on 7/3 on asa, brilinta, statin, and  blocker therapy.  BP was too soft for acei/arb/arni/mra.  Following d/c, she did well but this AM, she awoke @  4am with sscp radiating to her neck, upper back, and down her arms, associated with nausea and vomiting x 4, and dyspnea.  Ss persisted despite taking morning meds (she threw them up).  She presented to the ED with ongoing c/p and nausea.  ECG w/o acute changes.  Trop mildly elevated @ 0.56 (was 2.41 on 7/1). BP soft - 80's.  Chest, back, and arm pain currently 8/10.  Home Medications      Prior to Admission medications   Medication Sig Start Date End Date Taking? Authorizing Provider  acetaminophen (TYLENOL) 325 MG tablet Take 650 mg by mouth every 6 (six) hours  as needed for mild pain.    Yes [provider]  albuterol (PROAIR HFA) 108 (90 Base) MCG/ACT inhaler Inhale 1 puff into the lungs every 6 (six) hours as needed for wheezing or shortness of breath.    Yes [provider]  aspirin 81 MG tablet Take 81 mg by mouth daily.   Yes [provider]  atorvastatin (LIPITOR) 80 MG tablet Take 1 tablet (80 mg total) by mouth daily at 6 PM. 12/21/17 02/19/18 Yes Sainani, Belia Heman, MD  citalopram (CELEXA) 20 MG tablet Take 20 mg daily by mouth.   Yes [provider]  insulin aspart (NOVOLOG) 100 UNIT/ML injection Inject 10 Units into the skin 3 (three) times daily before meals. Take 10 units PLUS sliding scale   Yes [provider]  insulin glargine (LANTUS) 100 UNIT/ML injection Inject 60 Units into the skin at bedtime.   Yes [provider]  LORazepam (ATIVAN) 1 MG tablet Take 1 mg by mouth every 8 (eight) hours.   Yes [provider]  metoprolol succinate (TOPROL XL) 25 MG 24 hr tablet Take 0.5 tablets (12.5 mg total) by mouth daily. 12/21/17 02/19/18 Yes Henreitta Leber, MD  ticagrelor (BRILINTA) 90 MG TABS tablet Take 1 tablet (90 mg total) by mouth 2 (two) times daily. 12/21/17 02/19/18 Yes Henreitta Leber, MD     Family History    Family History  Problem Relation Age of Onset  . Cancer Mother   . Osteoporosis Sister   . Heart disease Brother   . Heart attack Father    indicated that her mother is deceased. She indicated that her father is deceased. She indicated that the status of her sister is unknown. She indicated that the status of her brother is unknown.   Social History    Social History   Socioeconomic History  . Marital status: Widowed    Spouse name: Not on file  . Number of children: Not on file  . Years of education: Not on file  . Highest education level: Not on file  Occupational History  . Not on file  Social Needs  . Financial resource strain: Not on file  . Food  insecurity:    Worry: Not on file    Inability: Not on file  . Transportation needs:    Medical: Not on file    Non-medical: Not on file  Tobacco Use  . Smoking status: Former Smoker    Packs/day: 0.50    Years: 30.00    Pack years: 15.00    Types: Cigarettes  . Smokeless tobacco: Never Used  . Tobacco comment: quit 12/19/2017.  Substance and Sexual Activity  . Alcohol use: No  . Drug use: No  . Sexual activity: Not on file  Lifestyle  . Physical activity:    Days per week: Not on file  Minutes per session: Not on file  . Stress: Not on file  Relationships  . Social connections:    Talks on phone: Not on file    Gets together: Not on file    Attends religious service: Not on file    Active member of club or organization: Not on file    Attends meetings of clubs or organizations: Not on file    Relationship status: Not on file  . Intimate partner violence:    Fear of current or ex partner: Not on file    Emotionally abused: Not on file    Physically abused: Not on file    Forced sexual activity: Not on file  Other Topics Concern  . Not on file  Social History Narrative   Lives in [place] by herself.  Does not routinely exercise.     Review of Systems    General:  No chills, fever, night sweats or weight changes.  Cardiovascular:  +++ chest pain, +++ dyspnea, no edema, orthopnea, palpitations, paroxysmal nocturnal dyspnea. Dermatological: No rash, lesions/masses Respiratory: No cough, dyspnea Urologic: No hematuria, dysuria Abdominal:   +++ nausea, +++ vomiting, no diarrhea, bright red blood per rectum, melena, or hematemesis Neurologic:  No visual changes, wkns, changes in mental status. All other systems reviewed and are otherwise negative except as noted above.  Physical Exam    Blood pressure (!) 88/58, pulse 73, temperature 98.1 F (36.7 C), temperature source Oral, resp. rate 19, height 5\' 1"  (1.549 m), weight 135 lb (61.2 kg), SpO2 97 %.  General:  Pleasant, NAD Psych: flat affect. Neuro: Alert and oriented X 3. Moves all extremities spontaneously. HEENT: Normal  Neck: Supple without bruits or JVD. Lungs:  Resp regular and unlabored, scattered exp wheezing. Heart: RRR no s3, s4, or murmurs. Distant heart sounds. Abdomen: Soft, non-tender, non-distended, BS + x 4.  Extremities: No clubbing, cyanosis or edema. DP/PT/Radials 2+ and equal bilaterally.  Labs     Recent Labs    12/26/17 1112  TROPONINI 0.56*   Lab Results  Component Value Date   WBC 10.4 12/26/2017   HGB 13.5 12/26/2017   HCT 38.4 12/26/2017   MCV 93.0 12/26/2017   PLT 287 12/26/2017    Recent Labs  Lab 12/26/17 1112  NA 138  K 3.9  CL 105  CO2 24  BUN 15  CREATININE 0.64  CALCIUM 9.0  PROT 7.1  BILITOT 0.4  ALKPHOS 96  ALT 18  AST 18  GLUCOSE 304*   Lab Results  Component Value Date   CHOL 213 (H) 12/19/2017   HDL 33 (L) 12/19/2017   LDLCALC 143 (H) 12/19/2017   TRIG 185 (H) 12/19/2017    Radiology Studies    No results found.  ECG & Cardiac Imaging    RSR, 95, LAD, RAE, antsept infarct, lat TWI - no acute changes  Assessment & Plan    1. Acute coronary syndrome/CAD:  S/p recent ACS/MI (7/1) with DES x 3 to the LAD.  Residual non-dominant RCA dzs.  Post-pci course complicated by cardiogenic shock and hypotension with EF of 30-35% by echo.  Did well following d/c, but awoke this AM with recurrent chest pain radiating to her back, neck, and arms, and assoc w/ dyspnea, n/v.  All similar to prior anginal Ss.  Here, ECG non-acute.  Trop 0.56 but not clear @ this point if this is on its way down from 7/1 or if it represents a new elevation.  Regardless, she continues to  have 8/10 c/p.  BP soft in the 80's  IVF.  Discussed with Dr. Fletcher Anon and we will pursue urgent catheterization now.  The patient understands that risks include but are not limited to stroke (1 in 1000), death (1 in 94), kidney failure [usually temporary] (1 in 500), bleeding (1  in 200), allergic reaction [possibly serious] (1 in 200), and agrees to proceed.  Cont asa, brilinta, statin, and  blocker as tolerated.  2.  HL:  LDL 143 on 7/1. LFTs wnl this AM.  Cont high potency statin rx.  3.  Tob Abuse:  Hasn't smoked since 7/1.  Congratulated.  Continued tobacco avoidance encouraged.  4.  HFrEF/ICM:  EF 30-35%.  Currently euvolemic.  Will have to watch fluids closely.  Cont  blocker as tolerated, though BP currently too low.  Previous no room for acei/arb/arni/mra.  5.  DMII:  Insulin per IM.  6.  Hypotension:  Follow. Will have to hold  blocker for time being.  Low threshold to institute vasopressor therapy.  Signed, Murray Hodgkins, NP 12/26/2017, 12:29 PM  For questions or updates, please contact   Please consult [email or web address] for contact info under Cardiology/STEMI.

## 2017-12-27 ENCOUNTER — Encounter: Payer: Self-pay | Admitting: Cardiovascular Disease

## 2017-12-27 DIAGNOSIS — I214 Non-ST elevation (NSTEMI) myocardial infarction: Secondary | ICD-10-CM

## 2017-12-27 DIAGNOSIS — I255 Ischemic cardiomyopathy: Secondary | ICD-10-CM

## 2017-12-27 DIAGNOSIS — I5022 Chronic systolic (congestive) heart failure: Secondary | ICD-10-CM

## 2017-12-27 DIAGNOSIS — Z955 Presence of coronary angioplasty implant and graft: Secondary | ICD-10-CM

## 2017-12-27 LAB — BASIC METABOLIC PANEL
Anion gap: 5 (ref 5–15)
BUN: 13 mg/dL (ref 6–20)
CO2: 24 mmol/L (ref 22–32)
CREATININE: 0.44 mg/dL (ref 0.44–1.00)
Calcium: 8.5 mg/dL — ABNORMAL LOW (ref 8.9–10.3)
Chloride: 108 mmol/L (ref 98–111)
GFR calc Af Amer: >60 mL/min (ref >60)
Glucose, Bld: 180 mg/dL — ABNORMAL HIGH (ref 70–99)
POTASSIUM: 3.7 mmol/L (ref 3.5–5.1)
SODIUM: 137 mmol/L (ref 135–145)

## 2017-12-27 LAB — GLUCOSE, CAPILLARY
GLUCOSE-CAPILLARY: 152 mg/dL — AB (ref 70–99)
Glucose-Capillary: 177 mg/dL — ABNORMAL HIGH (ref 70–99)
Glucose-Capillary: 192 mg/dL — ABNORMAL HIGH (ref 70–99)
Glucose-Capillary: 248 mg/dL — ABNORMAL HIGH (ref 70–99)

## 2017-12-27 LAB — TROPONIN I: TROPONIN I: 0.98 ng/mL — AB (ref <0.03)

## 2017-12-27 LAB — CBC
HEMATOCRIT: 36.3 % (ref 35.0–47.0)
Hemoglobin: 12.5 g/dL (ref 12.0–16.0)
MCH: 33 pg (ref 26.0–34.0)
MCHC: 34.5 g/dL (ref 32.0–36.0)
MCV: 95.7 fL (ref 80.0–100.0)
Platelets: 237 10*3/uL (ref 150–440)
RBC: 3.79 MIL/uL — ABNORMAL LOW (ref 3.80–5.20)
RDW: 13.8 % (ref 11.5–14.5)
WBC: 8.3 10*3/uL (ref 3.6–11.0)

## 2017-12-27 LAB — BRAIN NATRIURETIC PEPTIDE: B Natriuretic Peptide: 714 pg/mL — ABNORMAL HIGH (ref 0.0–100.0)

## 2017-12-27 MED ORDER — SODIUM CHLORIDE 0.9 % IV BOLUS
250.0000 mL | Freq: Once | INTRAVENOUS | Status: AC
  Administered 2017-12-27: 250 mL via INTRAVENOUS

## 2017-12-27 MED ORDER — SODIUM CHLORIDE 0.9 % IV BOLUS
500.0000 mL | Freq: Once | INTRAVENOUS | Status: AC
  Administered 2017-12-27: 500 mL via INTRAVENOUS

## 2017-12-27 MED ORDER — VANCOMYCIN HCL IN DEXTROSE 1-5 GM/200ML-% IV SOLN
1000.0000 mg | Freq: Once | INTRAVENOUS | Status: DC
  Filled 2017-12-27: qty 200

## 2017-12-27 NOTE — Telephone Encounter (Signed)
Patient currently in the hospital 

## 2017-12-27 NOTE — Progress Notes (Signed)
Patient ID: Renee Mack, female   DOB: [birth year], 52 y.o.   MRN: [id number]  [employer] PROGRESS NOTE  Beva Remund QIH:[id number] DOB: 1966/06/14 DOA: 12/26/2017 PCP: Janalyn Shy, MD  HPI/Subjective: Patient feeling numbness left hand.  She stated she had a pressure dressing on her wrist for a long period of time.  No complaints of chest pain.  No nausea or vomiting.  Objective: Vitals:   12/27/17 1200 12/27/17 1300  BP: (!) 97/57 (!) 87/53  Pulse: 71 73  Resp: 17 14  Temp:  98.2 F (36.8 C)  SpO2: 97% 97%    Filed Weights   12/26/17 1110 12/26/17 1117 12/26/17 1301  Weight: 61.2 kg (135 lb) 61.2 kg (135 lb) 61.2 kg (135 lb)    ROS: Review of Systems  Constitutional: Negative for chills and fever.  Eyes: Negative for blurred vision.  Respiratory: Negative for cough and shortness of breath.   Cardiovascular: Negative for chest pain.  Gastrointestinal: Negative for abdominal pain, constipation, diarrhea, nausea and vomiting.  Genitourinary: Negative for dysuria.  Musculoskeletal: Negative for joint pain.  Neurological: Negative for dizziness and headaches.   Exam: Physical Exam  HENT:  Nose: No mucosal edema.  Mouth/Throat: No oropharyngeal exudate or posterior oropharyngeal edema.  Eyes: Pupils are equal, round, and reactive to light. Conjunctivae, EOM and lids are normal.  Neck: No JVD present. Carotid bruit is not present. No edema present. No thyroid mass and no thyromegaly present.  Cardiovascular: S1 normal and S2 normal. Exam reveals no gallop.  No murmur heard. Pulses:      Dorsalis pedis pulses are 2+ on the right side, and 2+ on the left side.  Respiratory: No respiratory distress. She has no wheezes. She has no rhonchi. She has no rales.  GI: Soft. Bowel sounds are normal. There is no tenderness.  Musculoskeletal:       Right shoulder: She exhibits no swelling.  Lymphadenopathy:    She has no cervical adenopathy.  Neurological: She is alert. No  cranial nerve deficit.  Skin: Skin is warm. No rash noted. Nails show no clubbing.  Psychiatric: She has a normal mood and affect.      Data Reviewed: Basic Metabolic Panel: Recent Labs  Lab 12/26/17 1112 12/27/17 0313  NA 138 137  K 3.9 3.7  CL 105 108  CO2 24 24  GLUCOSE 304* 180*  BUN 15 13  CREATININE 0.64 0.44  CALCIUM 9.0 8.5*   Liver Function Tests: Recent Labs  Lab 12/26/17 1112  AST 18  ALT 18  ALKPHOS 96  BILITOT 0.4  PROT 7.1  ALBUMIN 3.4*   CBC: Recent Labs  Lab 12/26/17 1112 12/27/17 0313  WBC 10.4 8.3  HGB 13.5 12.5  HCT 38.4 36.3  MCV 93.0 95.7  PLT 287 237   Cardiac Enzymes: Recent Labs  Lab 12/26/17 1112 12/26/17 1733 12/26/17 2248 12/27/17 0313  TROPONINI 0.56* 0.84* 1.05* 0.98*   BNP (last 3 results) Recent Labs    12/27/17 0506  BNP 714.0*    CBG: Recent Labs  Lab 12/26/17 1537 12/26/17 1635 12/26/17 2234 12/27/17 0711 12/27/17 1154  GLUCAP 224* 268* 279* 152* 177*    Recent Results (from the past 240 hour(s))  MRSA PCR Screening     Status: None   Collection Time: 12/19/17  8:41 AM  Result Value Ref Range Status   MRSA by PCR NEGATIVE NEGATIVE Final    Comment:        The GeneXpert MRSA Assay (  FDA approved for NASAL specimens only), is one component of a comprehensive MRSA colonization surveillance program. It is not intended to diagnose MRSA infection nor to guide or monitor treatment for MRSA infections. Performed at [facility], [address], [place], [place] [address]   Urine Culture     Status: Abnormal   Collection Time: 12/19/17  1:13 PM  Result Value Ref Range Status   Specimen Description   Final    URINE, RANDOM Performed at [facility], [address], [place], [place] [address]    Special Requests   Final    NONE Performed at [facility], [address] [place], [place] [address]    Culture (A)  Final    <10,000 COLONIES/mL INSIGNIFICANT  GROWTH Performed at [facility] [address] [place], [place] [address]    Report Status 12/21/2017 FINAL  Final      Scheduled Meds: . aspirin EC  81 mg Oral Daily  . atorvastatin  80 mg Oral q1800  . citalopram  20 mg Oral Daily  . enoxaparin (LOVENOX) injection  40 mg Subcutaneous Q24H  . insulin aspart  0-9 Units Subcutaneous TID WC  . insulin glargine  60 Units Subcutaneous QHS  . LORazepam  1 mg Oral Q8H  . metoprolol succinate  12.5 mg Oral Daily  . sodium chloride flush  3 mL Intravenous Q12H  . ticagrelor  90 mg Oral BID   Continuous Infusions: . sodium chloride      Assessment/Plan:  1. Subacute in-stent thrombosis with prior STEMI.  Patient had a stent placed yesterday and LAD.  Continue aspirin, Brilinta and metoprolol and atorvastatin. 2. Relative hypotension continue metoprolol  3. hyperlipidemia unspecified on Lipitor 4. Depression on Celexa. 5. Type 2 diabetes mellitus on glargine insulin and sliding scale. 6. Cardiomyopathy.  Patient was prescribed a LifeVest on last discharge.  Blood pressure too low for other cardiac meds at this time. 7. Left hand numbness.  Patient had a tight wristband on after procedure.  Spoke with Dr. and cardiology.  He is hoping symptoms will resolve shortly.  Hand is warm and has good capillary refill.  Likely not a vascular issue.  Code Status:     Code Status Orders  (From admission, onward)        Start     Ordered   12/26/17 1538  Full code  Continuous     12/26/17 1538    Code Status History    Date Active Date Inactive Code Status Order ID Comments User Context   12/26/2017 1537 12/26/2017 1538 Full Code [id number]  Vaughan Basta, MD Inpatient   12/19/2017 0839 12/21/2017 1940 Full Code [id number]  Wellington Hampshire, MD Inpatient   11/03/2017 1031 11/04/2017 1850 Full Code [id number]  Fritzi Mandes, MD Inpatient   09/28/2017 1044 09/29/2017 2121 Full Code [id number]  Harrie Foreman, MD ED   07/17/2017 2004  07/20/2017 1840 Full Code [id number]  Henreitta Leber, MD ED   06/12/2017 2227 06/16/2017 1910 Full Code [id number]  Salary, Avel Peace, MD Inpatient   12/28/2014 0305 12/28/2014 1919 Full Code [id number]  Lance Coon, MD Inpatient     Disposition Plan: Hopefully home tomorrow  Consultants:  Cardiology  Time spent: 28 minutes  [place]

## 2017-12-27 NOTE — Progress Notes (Signed)
Dr. Saunders Revel at bedside and patient told MD that her left hand is numb.  Dr. Saunders Revel assessed patient's left hand.  Redness noted to left thumb, 2+ left radial pulse and good cap refill but hand is very cool with good wave form on pulse oximetry.  MD gave order to apply heated pad to patient's left hand.

## 2017-12-27 NOTE — Progress Notes (Signed)
[facility] followed up on a (AD) request over w/e for Renee Mack. She was admitted for CP, had subsequent L/S with stent placed. Now in ICU, stable and in good spirits. She was accompanied with her friend at her bedside. Her husband has passed, and she wanted to get an (AD). The [employer] proceeded with (AD) education. Renee Mack didn't have any questions after the (AD) education was completed, A packet was left with the patient, she was encouraged to think and pray about the decisions she was going to make, then alert her nurse to notify the [facility] when she wanted to have the (AD) completed

## 2017-12-27 NOTE — Progress Notes (Signed)
RN spoke with Sharolyn Douglas, NP and asked about giving metoprolol with current BP of 94/65.  NP gave order to hold todays dose.

## 2017-12-27 NOTE — Progress Notes (Signed)
Progress Note  Patient Name: Renee Mack Date of Encounter: 12/27/2017  Primary Cardiologist: [name], MD  Subjective   No chest pain or dyspnea overnight.  Left hand feels numb to her.  Moves all digits well.  No pain.  Pulse Ox high 90's from L index finger.  Inpatient Medications    Scheduled Meds: . aspirin EC  81 mg Oral Daily  . atorvastatin  80 mg Oral q1800  . citalopram  20 mg Oral Daily  . enoxaparin (LOVENOX) injection  40 mg Subcutaneous Q24H  . insulin aspart  0-9 Units Subcutaneous TID WC  . insulin glargine  60 Units Subcutaneous QHS  . LORazepam  1 mg Oral Q8H  . metoprolol succinate  12.5 mg Oral Daily  . sodium chloride flush  3 mL Intravenous Q12H  . ticagrelor  90 mg Oral BID   Continuous Infusions: . sodium chloride     PRN Meds: sodium chloride, acetaminophen, acetaminophen, albuterol, docusate sodium, nitroGLYCERIN, ondansetron (ZOFRAN) IV, oxyCODONE-acetaminophen, sodium chloride flush   Vital Signs    Vitals:   12/27/17 0800 12/27/17 0839 12/27/17 0900 12/27/17 1000  BP: (!) 83/53  (!) 89/59 94/65  Pulse: 68  68 65  Resp: 13  12 18   Temp:  98.2 F (36.8 C)    TempSrc:  Oral    SpO2: 97%  95% 95%  Weight:      Height:        Intake/Output Summary (Last 24 hours) at 12/27/2017 1038 Last data filed at 12/27/2017 0913 Gross per 24 hour  Intake 1121.75 ml  Output 450 ml  Net 671.75 ml   Filed Weights   12/26/17 1110 12/26/17 1117 12/26/17 1301  Weight: 135 lb (61.2 kg) 135 lb (61.2 kg) 135 lb (61.2 kg)    Physical Exam   GEN: Well nourished, well developed, in no acute distress.  HEENT: Grossly normal.  Neck: Supple, no JVD, carotid bruits, or masses. Cardiac: RRR, no murmurs, rubs, or gallops. No clubbing, cyanosis, edema.  Left radial/ulnar 2+. L hand cool to touch.  Nl mvmt.  Nl cap refill.  Respiratory:  Respirations regular and unlabored, clear to auscultation bilaterally. GI: Soft, nontender, nondistended, BS + x  4. MS: no deformity or atrophy. Skin: warm and dry, no rash. Neuro:  Strength and sensation are intact. Psych: AAOx3.  Normal affect.  Labs    Chemistry Recent Labs  Lab 12/26/17 1112 12/27/17 0313  NA 138 137  K 3.9 3.7  CL 105 108  CO2 24 24  GLUCOSE 304* 180*  BUN 15 13  CREATININE 0.64 0.44  CALCIUM 9.0 8.5*  PROT 7.1  --   ALBUMIN 3.4*  --   AST 18  --   ALT 18  --   ALKPHOS 96  --   BILITOT 0.4  --   GFRNONAA >60 >60  GFRAA >60 >60  ANIONGAP 9 5     Hematology Recent Labs  Lab 12/26/17 1112 12/27/17 0313  WBC 10.4 8.3  RBC 4.13 3.79*  HGB 13.5 12.5  HCT 38.4 36.3  MCV 93.0 95.7  MCH 32.7 33.0  MCHC 35.1 34.5  RDW 13.9 13.8  PLT 287 237    Cardiac Enzymes Recent Labs  Lab 12/26/17 1112 12/26/17 1733 12/26/17 2248 12/27/17 0313  TROPONINI 0.56* 0.84* 1.05* 0.98*      BNP Recent Labs  Lab 12/27/17 0506  BNP 714.0*     Radiology    No results found.  Telemetry  RSR - Personally Reviewed  ECG    RSR, 67, bi-atrial enlargement, anteroseptal infarct with lateral TWI - Personally Reviewed  Cardiac Studies   Cardiac Catheterization and Percutaneous Coronary Intervention 7.8.2019  Left Main  The vessel exhibits minimal luminal irregularities.  Left Anterior Descending  Prox LAD to Mid LAD lesion 100% stenosed  Prox LAD to Mid LAD lesion is 100% stenosed. The lesion is thrombotic. The lesion was previously treated using a drug eluting stent between 2-30 days ago. Previously placed stent displays rethrombosis.  Mid LAD lesion 70% stenosed  Mid LAD lesion is 70% stenosed.  Dist LAD lesion 10% stenosed  Dist LAD lesion is 10% stenosed. Vessel is not the culprit lesion. The lesion was previously treated.  Second Diagonal [employer] 2nd Diag to 2nd Diag lesion 90% stenosed  Ost 2nd Diag to 2nd Diag lesion is 90% stenosed.  Left Circumflex  Prox Cx lesion 30% stenosed  Prox Cx lesion is 30% stenosed.  Left Posterior Descending  Artery  The vessel exhibits minimal luminal irregularities.  First Left Posterolateral Branch  The vessel exhibits minimal luminal irregularities.  Second Left Posterolateral Branch  The vessel exhibits minimal luminal irregularities.  Right Coronary Artery  Prox RCA lesion 90% stenosed  Prox RCA lesion is 90% stenosed.  Intervention   Prox LAD to Mid LAD lesion  Angioplasty  Lesion length: 35 mm. Balloon angioplasty was performed using a BALLOON [place] RX2.5X20. Maximum pressure: 18 atm. Inflation time: 60 sec.  Post-Intervention Lesion Assessment  The intervention was successful. Pre-interventional TIMI flow is 0. Post-intervention TIMI flow is 3. No complications occurred at this lesion.  There is a 10% residual stenosis post intervention.  Mid LAD lesion  Stent  Lesion length: 15 mm. Lesion crossed with guidewire using a WIRE HI TORQ WHISPER MS 190CM. Pre-stent angioplasty was performed using a BALLOON [place] RX 2.0X20. A drug-eluting stent was successfully placed using a STENT RESOLUTE ONYX 2.0X18. Maximum pressure: 14 atm. Inflation time: 30 sec. Stent overlaps previously placed stent.  Post-Intervention Lesion Assessment  The intervention was successful. Pre-interventional TIMI flow is 0. Post-intervention TIMI flow is 3. No complications occurred at this lesion.  There is a 15% residual stenosis post intervention.  Dist LAD lesion  Post-Intervention Lesion Assessment  The intervention was successful. Pre-interventional TIMI flow is 0. Post-intervention TIMI flow is 3. No complications occurred at this lesion.  There is no residual stenosis post intervention.     Patient Profile     51 y.o. female with a history of CAD s/p recent MI with DES  LAD x 3, combined CHF/ICM w/ an EF of 30-35%, tob abuse, COPD, HL, GERD, DMII, and pancreatitis, who was readmitted on 7/8 with recurrent chest pain and sub-acute stent thrombosis.  Assessment & Plan    1.  NSTEMI/CAD:  Pt s/p MI  and DES to LAD x 3 on 7/1 complicated by CGS/hypotension. D/c'd with lifevest on 7/3. Developed recurrent c/p @ 4:30 AM on 7/8 - similar to prior event and later presented to ED where ECG not acutely changed. Urgent cath showed sub-acute thrombosis within LAD req repeat PTCA and DES to the mid LAD.  She's done well overnight w/o recurrent chest pain. BP remains very soft - 80's to 90's.  Cont ASA, high potency statin.  BP limiting  blocker usage currently - held this AM.  She has residual, severe, nondominant RCA dzs.  The RCA does supply collaterals to the LAD.  There is concern that  the LAD might occlude @ some point in the future 2/2 poor flow.  We may wish to consider cardiac MRI @ some point to assess for viability of the anterior wall and determine if future interventions to either the LAD or RCA would be beneficial.  2.  Hypotension:  Intermittent.  BP 80's to low 90's this AM.  Hold metoprolol for systolics < 492.  3.  HFrEF/ICM: EF 30-35% by echo last admission. Euvolemic this AM.  Will try to continue  blocker as tolerated but pressures too soft currently.  In that setting, no acei/arb/arni/mra.  LifeVest placed @ last discharge - continue following discharge w/ plan for f/u echo in roughly 3 mos to determine need for EP referral and ICD.  4.  L hand numbness:  Good cap refill. O2 sat 98% via pulse ox on index finger.  Good radial and ulnar pulses w/o bruits/hematoma/bleeding.  Hand is cool to touch.  Discussed w/ nsg staff. Warm compress to be applied to hand.  ? Swelling/inflammation related to arterial stick and nerve irritation.  5.  HL:  LDL 143 on 7/1. LFT's nl yesterday.  Cont high potency statin Rx w/ plan for f/u in about 6 wks.  6.  DMII:  Insulin per IM.  Signed, Murray Hodgkins, NP  12/27/2017, 10:38 AM    For questions or updates, please contact   Please consult [email or web address] for contact info under Cardiology/STEMI.

## 2017-12-27 NOTE — Progress Notes (Signed)
Patient's left hand now warm with use of warm blankets.  Kpad just arrived from supply chain but patient states "my hand is warm now so I think the blanket is enough."  Patient still complains of numbness to left hand finger tips. Will continue warm blanket use to left hand and arm at this time.

## 2017-12-28 LAB — BASIC METABOLIC PANEL
ANION GAP: 5 (ref 5–15)
BUN: 13 mg/dL (ref 6–20)
CALCIUM: 8.5 mg/dL — AB (ref 8.9–10.3)
CO2: 25 mmol/L (ref 22–32)
CREATININE: 0.4 mg/dL — AB (ref 0.44–1.00)
Chloride: 110 mmol/L (ref 98–111)
GFR calc Af Amer: >60 mL/min (ref >60)
GLUCOSE: 142 mg/dL — AB (ref 70–99)
Potassium: 3.7 mmol/L (ref 3.5–5.1)
Sodium: 140 mmol/L (ref 135–145)

## 2017-12-28 LAB — GLUCOSE, CAPILLARY
GLUCOSE-CAPILLARY: 115 mg/dL — AB (ref 70–99)
GLUCOSE-CAPILLARY: 135 mg/dL — AB (ref 70–99)
Glucose-Capillary: 99 mg/dL (ref 70–99)
Glucose-Capillary: 164 mg/dL — ABNORMAL HIGH (ref 70–99)

## 2017-12-28 LAB — LIPID PANEL
CHOL/HDL RATIO: 3.7 ratio
Cholesterol: 125 mg/dL (ref 0–200)
HDL: 34 mg/dL — ABNORMAL LOW (ref >40)
LDL CALC: 72 mg/dL (ref 0–99)
TRIGLYCERIDES: 94 mg/dL (ref <150)
VLDL: 19 mg/dL (ref 0–40)

## 2017-12-28 MED ORDER — FENTANYL CITRATE (PF) 100 MCG/2ML IJ SOLN
12.5000 ug | Freq: Once | INTRAMUSCULAR | Status: AC
  Administered 2017-12-28: 12.5 ug via INTRAVENOUS
  Filled 2017-12-28: qty 2

## 2017-12-28 MED ORDER — TRAZODONE HCL 50 MG PO TABS
50.0000 mg | ORAL_TABLET | Freq: Once | ORAL | Status: AC
  Administered 2017-12-28: 50 mg via ORAL
  Filled 2017-12-28: qty 1

## 2017-12-28 MED ORDER — MORPHINE SULFATE (PF) 2 MG/ML IV SOLN
2.0000 mg | INTRAVENOUS | Status: AC
  Administered 2017-12-28: 2 mg via INTRAVENOUS
  Filled 2017-12-28: qty 1

## 2017-12-28 MED ORDER — SODIUM CHLORIDE 0.9 % IV BOLUS
250.0000 mL | Freq: Once | INTRAVENOUS | Status: AC
  Administered 2017-12-28: 250 mL via INTRAVENOUS

## 2017-12-28 MED ORDER — MIDODRINE HCL 5 MG PO TABS
2.5000 mg | ORAL_TABLET | Freq: Three times a day (TID) | ORAL | Status: DC
Start: 2017-12-28 — End: 2017-12-30
  Administered 2017-12-28 – 2017-12-30 (×7): 2.5 mg via ORAL
  Filled 2017-12-28 (×3): qty 0.5
  Filled 2017-12-28: qty 1
  Filled 2017-12-28 (×3): qty 0.5
  Filled 2017-12-28: qty 1
  Filled 2017-12-28: qty 0.5

## 2017-12-28 MED ORDER — INSULIN ASPART 100 UNIT/ML ~~LOC~~ SOLN
SUBCUTANEOUS | Status: AC
  Administered 2017-12-28: 2 [IU] via SUBCUTANEOUS
  Filled 2017-12-28: qty 1

## 2017-12-28 MED ORDER — LIDOCAINE 5 % EX PTCH
1.0000 | MEDICATED_PATCH | CUTANEOUS | Status: DC
  Administered 2017-12-28 – 2017-12-30 (×2): 1 via TRANSDERMAL
  Filled 2017-12-28 (×3): qty 1

## 2017-12-28 NOTE — Progress Notes (Signed)
Patient ID: Renee Mack, female   DOB: Apr 29, 1966, 52 y.o.   MRN: [id number]  [facility] PROGRESS NOTE  Renee Mack EXB:[id number] DOB: 10-14-1965 DOA: 12/26/2017 PCP: Janalyn Shy, MD  HPI/Subjective: Left hand numbness has resolved.  Patient's blood pressure on the lower side and started on midodrine.  Blood pressure unable to tolerate the Toprol.  Objective: Vitals:   12/28/17 1300 12/28/17 1414  BP: (!) 77/48 95/63  Pulse: 63 78  Resp: 16 18  Temp:    SpO2: 96% 93%    Filed Weights   12/26/17 1301 12/27/17 2045 12/28/17 0332  Weight: 61.2 kg (135 lb) 63.4 kg (139 lb 12.4 oz) 62.1 kg (136 lb 14.5 oz)    ROS: Review of Systems  Constitutional: Negative for chills and fever.  Eyes: Negative for blurred vision.  Respiratory: Negative for cough and shortness of breath.   Cardiovascular: Negative for chest pain.  Gastrointestinal: Negative for abdominal pain, constipation, diarrhea, nausea and vomiting.  Genitourinary: Negative for dysuria.  Musculoskeletal: Negative for joint pain.  Neurological: Negative for dizziness and headaches.   Exam: Physical Exam  HENT:  Nose: No mucosal edema.  Mouth/Throat: No oropharyngeal exudate or posterior oropharyngeal edema.  Eyes: Pupils are equal, round, and reactive to light. Conjunctivae, EOM and lids are normal.  Neck: No JVD present. Carotid bruit is not present. No edema present. No thyroid mass and no thyromegaly present.  Cardiovascular: S1 normal and S2 normal. Exam reveals no gallop.  No murmur heard. Pulses:      Dorsalis pedis pulses are 2+ on the right side, and 2+ on the left side.  Respiratory: No respiratory distress. She has no wheezes. She has no rhonchi. She has no rales.  GI: Soft. Bowel sounds are normal. There is no tenderness.  Musculoskeletal:       Right ankle: She exhibits no swelling.       Left ankle: She exhibits no swelling.  Lymphadenopathy:    She has no cervical adenopathy.  Neurological: She  is alert. No cranial nerve deficit.  Skin: Skin is warm. No rash noted. Nails show no clubbing.  Psychiatric: She has a normal mood and affect.      Data Reviewed: Basic Metabolic Panel: Recent Labs  Lab 12/26/17 1112 12/27/17 0313 12/28/17 0509  NA 138 137 140  K 3.9 3.7 3.7  CL 105 108 110  CO2 24 24 25   GLUCOSE 304* 180* 142*  BUN 15 13 13   CREATININE 0.64 0.44 0.40*  CALCIUM 9.0 8.5* 8.5*   Liver Function Tests: Recent Labs  Lab 12/26/17 1112  AST 18  ALT 18  ALKPHOS 96  BILITOT 0.4  PROT 7.1  ALBUMIN 3.4*   CBC: Recent Labs  Lab 12/26/17 1112 12/27/17 0313  WBC 10.4 8.3  HGB 13.5 12.5  HCT 38.4 36.3  MCV 93.0 95.7  PLT 287 237   Cardiac Enzymes: Recent Labs  Lab 12/26/17 1112 12/26/17 1733 12/26/17 2248 12/27/17 0313  TROPONINI 0.56* 0.84* 1.05* 0.98*   BNP (last 3 results) Recent Labs    12/27/17 0506  BNP 714.0*    CBG: Recent Labs  Lab 12/27/17 1154 12/27/17 1600 12/27/17 2118 12/28/17 0732 12/28/17 1141  GLUCAP 177* 192* 248* 99 115*    Recent Results (from the past 240 hour(s))  MRSA PCR Screening     Status: None   Collection Time: 12/19/17  8:41 AM  Result Value Ref Range Status   MRSA by PCR NEGATIVE NEGATIVE Final  Comment:        The GeneXpert MRSA Assay (FDA approved for NASAL specimens only), is one component of a comprehensive MRSA colonization surveillance program. It is not intended to diagnose MRSA infection nor to guide or monitor treatment for MRSA infections. Performed at [facility], [address], [place], [place] [address]   Urine Culture     Status: Abnormal   Collection Time: 12/19/17  1:13 PM  Result Value Ref Range Status   Specimen Description   Final    URINE, RANDOM Performed at [facility], [address], [place], [place] [address]    Special Requests   Final    NONE Performed at [facility], [address] [place], [place] [address]    Culture  (A)  Final    <10,000 COLONIES/mL INSIGNIFICANT GROWTH Performed at [facility] [address], [place], [place] [address]    Report Status 12/21/2017 FINAL  Final      Scheduled Meds: . aspirin EC  81 mg Oral Daily  . atorvastatin  80 mg Oral q1800  . citalopram  20 mg Oral Daily  . enoxaparin (LOVENOX) injection  40 mg Subcutaneous Q24H  . insulin aspart  0-9 Units Subcutaneous TID WC  . insulin glargine  60 Units Subcutaneous QHS  . lidocaine  1 patch Transdermal Q24H  . LORazepam  1 mg Oral Q8H  . midodrine  2.5 mg Oral TID WC  . sodium chloride flush  3 mL Intravenous Q12H  . ticagrelor  90 mg Oral BID   Continuous Infusions: . sodium chloride      Assessment/Plan:  1. Subacute in-stent thrombosis with prior STEMI.  Patient had a stent placed to LAD.  Continue aspirin, Brilinta and atorvastatin. 2. Relative hypotension.  Started on midodrine and Toprol discontinued. 3. hyperlipidemia unspecified on Lipitor 4. Depression on Celexa. 5. Type 2 diabetes mellitus on glargine insulin and sliding scale. 6. Cardiomyopathy.  Patient was prescribed a LifeVest on last discharge.  Blood pressure too low for other cardiac meds at this time. 7. Left hand numbness.  Patient had a tight wristband on after procedure.   this has resolved.  No further numbness.  Patient has full range of motion of her fingers and hand.  Code Status:     Code Status Orders  (From admission, onward)        Start     Ordered   12/26/17 1538  Full code  Continuous     12/26/17 1538    Code Status History    Date Active Date Inactive Code Status Order ID Comments User Context   12/26/2017 1537 12/26/2017 1538 Full Code [id number]  Vaughan Basta, MD Inpatient   12/19/2017 0839 12/21/2017 1940 Full Code [id number]  Wellington Hampshire, MD Inpatient   11/03/2017 1031 11/04/2017 1850 Full Code [id number]  Fritzi Mandes, MD Inpatient   09/28/2017 1044 09/29/2017 2121 Full Code [id number]  Harrie Foreman,  MD ED   07/17/2017 2004 07/20/2017 1840 Full Code [id number]  Henreitta Leber, MD ED   06/12/2017 2227 06/16/2017 1910 Full Code [id number]  Salary, Avel Peace, MD Inpatient   12/28/2014 0305 12/28/2014 1919 Full Code [id number]  Lance Coon, MD Inpatient     Disposition Plan:  to be determined based on clinical course  Consultants:  Cardiology  Time spent: 25 minutes  [place]

## 2017-12-28 NOTE — Progress Notes (Signed)
Progress Note  Patient Name: Renee Mack Date of Encounter: 12/28/2017  Primary Cardiologist: Fletcher Anon  Subjective   No chest pain or SOB. BP continues to run in the 80s/50s. Has been receiving Ativan tid (anxiety), oxycodone (DDD, vulvar cancer pain), and trazodone. Has not been receiving metoprolol. Lightheaded.   Inpatient Medications    Scheduled Meds: . aspirin EC  81 mg Oral Daily  . atorvastatin  80 mg Oral q1800  . citalopram  20 mg Oral Daily  . enoxaparin (LOVENOX) injection  40 mg Subcutaneous Q24H  . insulin aspart  0-9 Units Subcutaneous TID WC  . insulin glargine  60 Units Subcutaneous QHS  . lidocaine  1 patch Transdermal Q24H  . LORazepam  1 mg Oral Q8H  . midodrine  2.5 mg Oral TID WC  . sodium chloride flush  3 mL Intravenous Q12H  . ticagrelor  90 mg Oral BID   Continuous Infusions: . sodium chloride     PRN Meds: sodium chloride, acetaminophen, acetaminophen, albuterol, docusate sodium, nitroGLYCERIN, ondansetron (ZOFRAN) IV, oxyCODONE-acetaminophen, sodium chloride flush   Vital Signs    Vitals:   12/28/17 0708 12/28/17 0745 12/28/17 0800 12/28/17 0930  BP: (!) 83/58  (!) 82/61 (!) 82/53  Pulse: 73  74 75  Resp: _0 Temp:  98.1 F (36.7 C)    TempSrc:  Oral    SpO2: 95%  94% 91%  Weight:      Height:        Intake/Output Summary (Last 24 hours) at 12/28/2017 1021 Last data filed at 12/28/2017 0952 Gross per 24 hour  Intake 856 ml  Output -  Net 856 ml   Filed Weights   12/26/17 1301 12/27/17 2045 12/28/17 0332  Weight: 135 lb (61.2 kg) 139 lb 12.4 oz (63.4 kg) 136 lb 14.5 oz (62.1 kg)    Telemetry    NSR - Personally Reviewed  ECG    n/a - Personally Reviewed  Physical Exam   GEN: No acute distress.   Neck: No JVD. Cardiac: RRR, no murmurs, rubs, or gallops.  Respiratory: Clear to auscultation bilaterally.  GI: Soft, nontender, non-distended.   MS: No edema; No deformity. Neuro:  Alert and oriented x 3; Nonfocal.   Psych: Normal affect.  Labs    Chemistry Recent Labs  Lab 12/26/17 1112 12/27/17 0313 12/28/17 0509  NA 138 137 140  K 3.9 3.7 3.7  CL 105 108 110  CO2 _1 GLUCOSE 304* 180* 142*  BUN _2 CREATININE 0.64 0.44 0.40*  CALCIUM 9.0 8.5* 8.5*  PROT 7.1  --   --   ALBUMIN 3.4*  --   --   AST 18  --   --   ALT 18  --   --   ALKPHOS 96  --   --   BILITOT 0.4  --   --   GFRNONAA >60 >60 >60  GFRAA >60 >60 >60  ANIONGAP _3 Hematology Recent Labs  Lab 12/26/17 1112 12/27/17 0313  WBC 10.4 8.3  RBC 4.13 3.79*  HGB 13.5 12.5  HCT 38.4 36.3  MCV 93.0 95.7  MCH 32.7 33.0  MCHC 35.1 34.5  RDW 13.9 13.8  PLT 287 237    Cardiac Enzymes Recent Labs  Lab 12/26/17 1112 12/26/17 1733 12/26/17 2248 12/27/17 0313  TROPONINI 0.56* 0.84* 1.05* 0.98*   No results for input(s): TROPIPOC in the last 168 hours.  BNP Recent Labs  Lab 12/27/17 0506  BNP 714.0*     DDimer No results for input(s): DDIMER in the last 168 hours.   Radiology    No results found.  Cardiac Studies   Cardiac Catheterization and Percutaneous Coronary Intervention 7.8.2019      Left Main  The vessel exhibits minimal luminal irregularities.  Left Anterior Descending  Prox LAD to Mid LAD lesion 100% stenosed  Prox LAD to Mid LAD lesion is 100% stenosed. The lesion is thrombotic. The lesion was previously treated using a drug eluting stent between 2-30 days ago. Previously placed stent displays rethrombosis.  Mid LAD lesion 70% stenosed  Mid LAD lesion is 70% stenosed.  Dist LAD lesion 10% stenosed  Dist LAD lesion is 10% stenosed. Vessel is not the culprit lesion. The lesion was previously treated.  Second Diagonal [employer] 2nd Diag to 2nd Diag lesion 90% stenosed  Ost 2nd Diag to 2nd Diag lesion is 90% stenosed.  Left Circumflex    Prox Cx lesion 30% stenosed    Prox Cx lesion is 30% stenosed.    Left Posterior Descending Artery  The vessel exhibits minimal  luminal irregularities.  First Left Posterolateral Branch  The vessel exhibits minimal luminal irregularities.  Second Left Posterolateral Branch  The vessel exhibits minimal luminal irregularities.  Right Coronary Artery   Prox RCA lesion 90% stenosed   Prox RCA lesion is 90% stenosed.   Intervention   Prox LAD to Mid LAD lesion  Angioplasty  Lesion length: 35 mm. Balloon angioplasty was performed using a BALLOON [place] RX2.5X20. Maximum pressure: 18 atm. Inflation time: 60 sec.  Post-Intervention Lesion Assessment  The intervention was successful. Pre-interventional TIMI flow is 0. Post-intervention TIMI flow is 3. No complications occurred at this lesion.  There is a 10% residual stenosis post intervention.  Mid LAD lesion  Stent  Lesion length: 15 mm. Lesion crossed with guidewire using a WIRE HI TORQ [employer] 190CM. Pre-stent angioplasty was performed using a BALLOON [place] RX 2.0X20. A drug-eluting stent was successfully placed using a STENT RESOLUTE ONYX 2.0X18. Maximum pressure: 14 atm. Inflation time: 30 sec. Stent overlaps previously placed stent.  Post-Intervention Lesion Assessment  The intervention was successful. Pre-interventional TIMI flow is 0. Post-intervention TIMI flow is 3. No complications occurred at this lesion.  There is a 15% residual stenosis post intervention.  Dist LAD lesion  Post-Intervention Lesion Assessment  The intervention was successful. Pre-interventional TIMI flow is 0. Post-intervention TIMI flow is 3. No complications occurred at this lesion.  There is no residual stenosis post intervention.    Patient Profile     52 y.o. female with history of CAD s/p recent MI with DESLAD x 3, combined CHF/ICM w/ an EF of 30-35%, tob abuse, COPD, HL, GERD, DMII, and pancreatitis, who was readmitted on 7/8 with recurrent chest pain and sub-acute stent thrombosis.  Assessment & Plan    1. NSTEMI/CAD: -Chest pain free -Urgent cath this admission  showed sub--acute thrombosis within LAD req repeat PTCA and DES to the mid LAD -Continue ASA, Brilinta, and Lipitor -Not on beta blocker as below -She has residual, severe, nondominant RCA disease. The RCA does supply collaterals to the LAD. There is concern that the LAD might occlude at some point in the future 2/2 poor flow. We may wish to consider cardiac MRI at some point to assess for viability of the anterior wall and determine if future interventions to either the LAD or RCA would  be beneficial  2. Hypotension: -BP continues to run low in the 80s/50s -Unable to take any evidence-based heart failure medications 2/2 soft BP -Attempt to space out Ativan and oxycodone as below with possible holding of trazodone -Add midodrine as below  3. HFrEF/ICM: -She does not appear grossly volume up  -Unfortunately, her hypotension has precluded evidence-based heart failure therapy -Would consider spacing out her Ativan/oxycodone if possible and stopping trazodone (not on this at home) -Add midodrine 2.5 mg tid -Metoprolol has been stopped this morning given lightheadedness and persistent hypotension -Escalate evidence-based heart failure therapy as able -LifeVest was placed at last discharge, continue at discharge from this admission  -She will need a repeat echo in ~ 3 months to re-evaluate EF with possible referral to EP for ICD  4. L hand numbness: -Improved  5. HLD: -LDL 143 on 7/1 with normal LFts -Continue high-intensity statin with plan to recheck as an outpatient  For questions or updates, please contact [place] Please consult [email or web address] for contact info under Cardiology/STEMI.    Signed, Christell Faith, PA-C [place] Pager: [phone] 12/28/2017, 10:21 AM

## 2017-12-28 NOTE — Progress Notes (Signed)
2015 - Pt found up walking around the room. She had used the commode and urinated on the floor.  I assisted her back into the bed and instructed her to call the staff for assistance.

## 2017-12-29 LAB — GLUCOSE, CAPILLARY
GLUCOSE-CAPILLARY: 218 mg/dL — AB (ref 70–99)
Glucose-Capillary: 192 mg/dL — ABNORMAL HIGH (ref 70–99)
Glucose-Capillary: 275 mg/dL — ABNORMAL HIGH (ref 70–99)
Glucose-Capillary: 317 mg/dL — ABNORMAL HIGH (ref 70–99)

## 2017-12-29 MED ORDER — DIGOXIN 250 MCG PO TABS
0.2500 mg | ORAL_TABLET | Freq: Once | ORAL | Status: AC
  Administered 2017-12-29: 0.25 mg via ORAL
  Filled 2017-12-29: qty 1

## 2017-12-29 MED ORDER — HYDROCORTISONE NA SUCCINATE PF 100 MG IJ SOLR
50.0000 mg | Freq: Four times a day (QID) | INTRAMUSCULAR | Status: DC
  Administered 2017-12-29 – 2017-12-30 (×4): 50 mg via INTRAVENOUS
  Filled 2017-12-29 (×2): qty 1
  Filled 2017-12-29: qty 2
  Filled 2017-12-29: qty 1
  Filled 2017-12-29: qty 2
  Filled 2017-12-29: qty 1

## 2017-12-29 MED ORDER — MIDODRINE HCL 5 MG PO TABS
2.5000 mg | ORAL_TABLET | ORAL | Status: AC
Start: 2017-12-29 — End: 2017-12-29
  Administered 2017-12-29: 2.5 mg via ORAL
  Filled 2017-12-29: qty 0.5

## 2017-12-29 MED ORDER — DIGOXIN 125 MCG PO TABS
0.1250 mg | ORAL_TABLET | Freq: Every day | ORAL | Status: DC
  Administered 2017-12-30: 0.125 mg via ORAL
  Filled 2017-12-29: qty 1

## 2017-12-29 MED ORDER — NOREPINEPHRINE 4 MG/250ML-% IV SOLN
0.0000 ug/min | INTRAVENOUS | Status: DC
  Administered 2017-12-29: 3 ug/min via INTRAVENOUS
  Filled 2017-12-29: qty 250

## 2017-12-29 NOTE — Progress Notes (Signed)
Levophed stopped (was recently titrated to 59mcg at 1310)  per Dr. Loletha Grayer. Pt has a map of 62. Map goal of 60 per Dr. Mortimer Fries. Pt is asymptomatic, alert and oriented. Will continue to monitor.

## 2017-12-29 NOTE — Progress Notes (Deleted)
Notified Dr. Mortimer Fries of CBG of 257. No new orders at this time.

## 2017-12-29 NOTE — Progress Notes (Signed)
Progress Note  Patient Name: Renee Mack Date of Encounter: 12/29/2017  Primary Cardiologist: Fletcher Anon  Subjective   She denies chest pain or shortness of breath.  Main issue continues to be hypotension.  It appears that she was started on a small dose norepinephrine drip overnight.  Her last night when I am not entirely clear why she was started on this given that this has been a chronic issue  since her myocardial infarction.   Inpatient Medications    Scheduled Meds: . aspirin EC  81 mg Oral Daily  . atorvastatin  80 mg Oral q1800  . citalopram  20 mg Oral Daily  . [START ON 12/30/2017] digoxin  0.125 mg Oral Daily  . digoxin  0.25 mg Oral Once  . enoxaparin (LOVENOX) injection  40 mg Subcutaneous Q24H  . insulin aspart  0-9 Units Subcutaneous TID WC  . insulin glargine  60 Units Subcutaneous QHS  . lidocaine  1 patch Transdermal Q24H  . LORazepam  1 mg Oral Q8H  . midodrine  2.5 mg Oral TID WC  . sodium chloride flush  3 mL Intravenous Q12H  . ticagrelor  90 mg Oral BID   Continuous Infusions: . sodium chloride    . norepinephrine (LEVOPHED) Adult infusion 2 mcg/min (12/29/17 0758)   PRN Meds: sodium chloride, acetaminophen, acetaminophen, albuterol, docusate sodium, nitroGLYCERIN, ondansetron (ZOFRAN) IV, oxyCODONE-acetaminophen, sodium chloride flush   Vital Signs    Vitals:   12/29/17 0300 12/29/17 0400 12/29/17 0500 12/29/17 0600  BP: 111/65 (!) 98/57 104/63 102/61  Pulse: (!) 59 70 80 85  Resp: _0 Temp:      TempSrc:      SpO2: 95% 94% 94% 98%  Weight:      Height:        Intake/Output Summary (Last 24 hours) at 12/29/2017 0806 Last data filed at 12/29/2017 0605 Gross per 24 hour  Intake 1183.65 ml  Output 1000 ml  Net 183.65 ml   Filed Weights   12/26/17 1301 12/27/17 2045 12/28/17 0332  Weight: 135 lb (61.2 kg) 139 lb 12.4 oz (63.4 kg) 136 lb 14.5 oz (62.1 kg)    Telemetry    NSR - Personally Reviewed  ECG    n/a - Personally  Reviewed  Physical Exam   GEN: No acute distress.   Neck: No JVD. Cardiac: RRR, no murmurs, rubs, or gallops.  Respiratory: Clear to auscultation bilaterally.  GI: Soft, nontender, non-distended.   MS: No edema; No deformity. Neuro:  Alert and oriented x 3; Nonfocal.  Psych: Normal affect.  Labs    Chemistry Recent Labs  Lab 12/26/17 1112 12/27/17 0313 12/28/17 0509  NA 138 137 140  K 3.9 3.7 3.7  CL 105 108 110  CO2 _1 GLUCOSE 304* 180* 142*  BUN _2 CREATININE 0.64 0.44 0.40*  CALCIUM 9.0 8.5* 8.5*  PROT 7.1  --   --   ALBUMIN 3.4*  --   --   AST 18  --   --   ALT 18  --   --   ALKPHOS 96  --   --   BILITOT 0.4  --   --   GFRNONAA >60 >60 >60  GFRAA >60 >60 >60  ANIONGAP _3 Hematology Recent Labs  Lab 12/26/17 1112 12/27/17 0313  WBC 10.4 8.3  RBC 4.13 3.79*  HGB 13.5 12.5  HCT 38.4 36.3  MCV  93.0 95.7  MCH 32.7 33.0  MCHC 35.1 34.5  RDW 13.9 13.8  PLT 287 237    Cardiac Enzymes Recent Labs  Lab 12/26/17 1112 12/26/17 1733 12/26/17 2248 12/27/17 0313  TROPONINI 0.56* 0.84* 1.05* 0.98*   No results for input(s): TROPIPOC in the last 168 hours.   BNP Recent Labs  Lab 12/27/17 0506  BNP 714.0*     DDimer No results for input(s): DDIMER in the last 168 hours.   Radiology    No results found.  Cardiac Studies   Cardiac Catheterization and Percutaneous Coronary Intervention 7.8.2019      Left Main  The vessel exhibits minimal luminal irregularities.  Left Anterior Descending  Prox LAD to Mid LAD lesion 100% stenosed  Prox LAD to Mid LAD lesion is 100% stenosed. The lesion is thrombotic. The lesion was previously treated using a drug eluting stent between 2-30 days ago. Previously placed stent displays rethrombosis.  Mid LAD lesion 70% stenosed  Mid LAD lesion is 70% stenosed.  Dist LAD lesion 10% stenosed  Dist LAD lesion is 10% stenosed. Vessel is not the culprit lesion. The lesion was previously treated.    Second Diagonal [employer] 2nd Diag to 2nd Diag lesion 90% stenosed  Ost 2nd Diag to 2nd Diag lesion is 90% stenosed.  Left Circumflex    Prox Cx lesion 30% stenosed    Prox Cx lesion is 30% stenosed.    Left Posterior Descending Artery  The vessel exhibits minimal luminal irregularities.  First Left Posterolateral Branch  The vessel exhibits minimal luminal irregularities.  Second Left Posterolateral Branch  The vessel exhibits minimal luminal irregularities.  Right Coronary Artery   Prox RCA lesion 90% stenosed   Prox RCA lesion is 90% stenosed.   Intervention   Prox LAD to Mid LAD lesion  Angioplasty  Lesion length: 35 mm. Balloon angioplasty was performed using a BALLOON [place] RX2.5X20. Maximum pressure: 18 atm. Inflation time: 60 sec.  Post-Intervention Lesion Assessment  The intervention was successful. Pre-interventional TIMI flow is 0. Post-intervention TIMI flow is 3. No complications occurred at this lesion.  There is a 10% residual stenosis post intervention.  Mid LAD lesion  Stent  Lesion length: 15 mm. Lesion crossed with guidewire using a WIRE HI TORQ WHISPER MS 190CM. Pre-stent angioplasty was performed using a BALLOON MINITREK RX 2.0X20. A drug-eluting stent was successfully placed using a STENT RESOLUTE ONYX 2.0X18. Maximum pressure: 14 atm. Inflation time: 30 sec. Stent overlaps previously placed stent.  Post-Intervention Lesion Assessment  The intervention was successful. Pre-interventional TIMI flow is 0. Post-intervention TIMI flow is 3. No complications occurred at this lesion.  There is a 15% residual stenosis post intervention.  Dist LAD lesion  Post-Intervention Lesion Assessment  The intervention was successful. Pre-interventional TIMI flow is 0. Post-intervention TIMI flow is 3. No complications occurred at this lesion.  There is no residual stenosis post intervention.    Patient Profile     52 y.o. female with history of CAD s/p recent MI with  DESLAD x 3, combined CHF/ICM w/ an EF of 30-35%, tob abuse, COPD, HL, GERD, DMII, and pancreatitis, who was readmitted on 7/8 with recurrent chest pain and sub-acute stent thrombosis.  Assessment & Plan    1. NSTEMI/CAD: -Chest pain free -Urgent cath this admission showed sub--acute thrombosis within LAD req repeat PTCA and DES to the mid LAD -Continue ASA, Brilinta, and Lipitor -Not on beta blocker as below  2. Hypotension: -BP continues to  run low in the 80s/50s -Unable to take any evidence-based heart failure medications 2/2 soft BP -Small dose of midodrine was added yesterday. -I am going to wean her off norepinephrine drip which was started overnight for unclear reasons.  3. HFrEF/ICM: -She does not appear grossly volume up  -Unfortunately, her hypotension has precluded evidence-based heart failure therapy --LifeVest was placed at last discharge, continue at discharge from this admission  -Not able to use evidence-based therapies for heart failure due to hypotension. I elected to add small dose digoxin.  4. L hand numbness: -Improved  5. HLD: -LDL 143 on 7/1 with normal LFts -Continue high-intensity statin with plan to recheck as an outpatient  The plan is to wean off norepinephrine drip today and transferred to telemetry with possible discharge home tomorrow.  For questions or updates, please contact [place] Please consult [email or web address] for contact info under Cardiology/STEMI.    Signed,  [name], MD [facility] 12/29/2017, 8:06 AM

## 2017-12-29 NOTE — Progress Notes (Signed)
   12/29/17 1100  Clinical Encounter Type  Visited With Patient  Visit Type Follow-up   Chaplain attempted visit with patient; patient expressed desire to rest, but would like chaplain to return later today.  Chaplain will follow up this afternoon.

## 2017-12-29 NOTE — Progress Notes (Signed)
   12/29/17 1407  Clinical Encounter Type  Visited With Patient;Other (Comment) (friend entered after visit began)  Visit Type Follow-up  Spiritual Encounters  Spiritual Needs Emotional   Chaplain followed back up with patient per earlier conversation.  Discussion around events from last hospitalization to this one.  Chaplain and patient engaged in review of that which sustains patient.  She expressed preference to be outside as much as she can and spoke of a good friend on whom she can rely.  Patient spoke about physician conversation regarding whether or not she may move to live with her daughter.  Patient's friend Manuela Schwartz entered to visit so chaplain exited to give them time together.

## 2017-12-29 NOTE — Progress Notes (Signed)
Patient ID: Renee Mack, female   DOB: May 05, 1966, 52 y.o.   MRN: [id number]  [employer] PROGRESS NOTE  Glender [place] OBS:[id number] DOB: 1965/11/28 DOA: 12/26/2017 PCP: Janalyn Shy, MD  HPI/Subjective: Patient was placed on levo fed last night.  When I was admitted this afternoon I asked the nurse to see if we can get her off the Levophed.  Patient already on midodrine.  Her blood pressure is normally on the lower side even on the last hospitalization.  Left hand numbness is gone.  Objective: Vitals:   12/29/17 0600 12/29/17 0837  BP: 102/61   Pulse: 85 75  Resp: 19   Temp:    SpO2: 98%     Filed Weights   12/26/17 1301 12/27/17 2045 12/28/17 0332  Weight: 61.2 kg (135 lb) 63.4 kg (139 lb 12.4 oz) 62.1 kg (136 lb 14.5 oz)    ROS: Review of Systems  Constitutional: Negative for chills and fever.  Eyes: Negative for blurred vision.  Respiratory: Negative for cough and shortness of breath.   Cardiovascular: Negative for chest pain.  Gastrointestinal: Negative for abdominal pain, constipation, diarrhea, nausea and vomiting.  Genitourinary: Negative for dysuria.  Musculoskeletal: Negative for joint pain.  Neurological: Positive for dizziness. Negative for headaches.   Exam: Physical Exam  HENT:  Nose: No mucosal edema.  Mouth/Throat: No oropharyngeal exudate or posterior oropharyngeal edema.  Eyes: Pupils are equal, round, and reactive to light. Conjunctivae, EOM and lids are normal.  Neck: No JVD present. Carotid bruit is not present. No edema present. No thyroid mass and no thyromegaly present.  Cardiovascular: S1 normal and S2 normal. Exam reveals no gallop.  No murmur heard. Pulses:      Dorsalis pedis pulses are 2+ on the right side, and 2+ on the left side.  Respiratory: No respiratory distress. She has no wheezes. She has no rhonchi. She has no rales.  GI: Soft. Bowel sounds are normal. There is no tenderness.  Musculoskeletal:       Right ankle: She exhibits no  swelling.       Left ankle: She exhibits no swelling.  Lymphadenopathy:    She has no cervical adenopathy.  Neurological: She is alert. No cranial nerve deficit.  Skin: Skin is warm. No rash noted. Nails show no clubbing.  Psychiatric: She has a normal mood and affect.      Data Reviewed: Basic Metabolic Panel: Recent Labs  Lab 12/26/17 1112 12/27/17 0313 12/28/17 0509  NA 138 137 140  K 3.9 3.7 3.7  CL 105 108 110  CO2 24 24 25   GLUCOSE 304* 180* 142*  BUN 15 13 13   CREATININE 0.64 0.44 0.40*  CALCIUM 9.0 8.5* 8.5*   Liver Function Tests: Recent Labs  Lab 12/26/17 1112  AST 18  ALT 18  ALKPHOS 96  BILITOT 0.4  PROT 7.1  ALBUMIN 3.4*   CBC: Recent Labs  Lab 12/26/17 1112 12/27/17 0313  WBC 10.4 8.3  HGB 13.5 12.5  HCT 38.4 36.3  MCV 93.0 95.7  PLT 287 237   Cardiac Enzymes: Recent Labs  Lab 12/26/17 1112 12/26/17 1733 12/26/17 2248 12/27/17 0313  TROPONINI 0.56* 0.84* 1.05* 0.98*   BNP (last 3 results) Recent Labs    12/27/17 0506  BNP 714.0*    CBG: Recent Labs  Lab 12/28/17 1141 12/28/17 1614 12/28/17 2149 12/29/17 0736 12/29/17 1154  GLUCAP 115* 164* 135* 218* 192*    Scheduled Meds: . aspirin EC  81 mg Oral Daily  .  atorvastatin  80 mg Oral q1800  . citalopram  20 mg Oral Daily  . [START ON 12/30/2017] digoxin  0.125 mg Oral Daily  . enoxaparin (LOVENOX) injection  40 mg Subcutaneous Q24H  . hydrocortisone sod succinate (SOLU-CORTEF) inj  50 mg Intravenous Q6H  . insulin aspart  0-9 Units Subcutaneous TID WC  . insulin glargine  60 Units Subcutaneous QHS  . lidocaine  1 patch Transdermal Q24H  . LORazepam  1 mg Oral Q8H  . midodrine  2.5 mg Oral TID WC  . sodium chloride flush  3 mL Intravenous Q12H  . ticagrelor  90 mg Oral BID   Continuous Infusions: . sodium chloride    . norepinephrine (LEVOPHED) Adult infusion Stopped (12/29/17 1320)    Assessment/Plan:  1. Subacute in-stent thrombosis with prior STEMI.  Patient  had a stent placed to LAD.  Continue aspirin, Brilinta and atorvastatin. 2. Relative hypotension.  Started on midodrine and Toprol discontinued.  Asked nursing staff to get off the Levophed drip.  Hopefully can ambulate the patient today. 3. hyperlipidemia unspecified on Lipitor 4. Depression on Celexa. 5. Type 2 diabetes mellitus on glargine insulin and sliding scale. 6. Cardiomyopathy.  Patient was prescribed a LifeVest on last discharge.  Blood pressure too low for other cardiac meds at this time. 7. Left hand numbness.  This has resolved  Code Status:     Code Status Orders  (From admission, onward)        Start     Ordered   12/26/17 1538  Full code  Continuous     12/26/17 1538    Code Status History    Date Active Date Inactive Code Status Order ID Comments User Context   12/26/2017 1537 12/26/2017 1538 Full Code [id number]  Vaughan Basta, MD Inpatient   12/19/2017 0839 12/21/2017 1940 Full Code [id number]  Wellington Hampshire, MD Inpatient   11/03/2017 1031 11/04/2017 1850 Full Code [id number]  Fritzi Mandes, MD Inpatient   09/28/2017 1044 09/29/2017 2121 Full Code [id number]  Harrie Foreman, MD ED   07/17/2017 2004 07/20/2017 1840 Full Code [id number]  Henreitta Leber, MD ED   06/12/2017 2227 06/16/2017 1910 Full Code [id number]  Salary, Avel Peace, MD Inpatient   12/28/2014 0305 12/28/2014 1919 Full Code [id number]  Lance Coon, MD Inpatient     Disposition Plan:   hopefully we can ambulate her this afternoon and tomorrow morning and see how she feels.  Would like to send the patient home tomorrow morning  Consultants:  Cardiology  Time spent: 26 minutes  [place]

## 2017-12-30 LAB — GLUCOSE, CAPILLARY
GLUCOSE-CAPILLARY: 336 mg/dL — AB (ref 70–99)
Glucose-Capillary: 284 mg/dL — ABNORMAL HIGH (ref 70–99)

## 2017-12-30 LAB — BASIC METABOLIC PANEL
ANION GAP: 8 (ref 5–15)
BUN: 18 mg/dL (ref 6–20)
CO2: 24 mmol/L (ref 22–32)
Calcium: 9.3 mg/dL (ref 8.9–10.3)
Chloride: 102 mmol/L (ref 98–111)
Creatinine, Ser: 0.53 mg/dL (ref 0.44–1.00)
Glucose, Bld: 331 mg/dL — ABNORMAL HIGH (ref 70–99)
POTASSIUM: 4.1 mmol/L (ref 3.5–5.1)
SODIUM: 134 mmol/L — AB (ref 135–145)

## 2017-12-30 MED ORDER — NICOTINE 14 MG/24HR TD PT24: 14.0000 mg | MEDICATED_PATCH | TRANSDERMAL | 0 refills | Status: DC

## 2017-12-30 MED ORDER — MIDODRINE HCL 2.5 MG PO TABS: 2.5000 mg | ORAL_TABLET | Freq: Three times a day (TID) | ORAL | 0 refills | Status: DC

## 2017-12-30 MED ORDER — DIGOXIN 125 MCG PO TABS: 0.1250 mg | ORAL_TABLET | Freq: Every day | ORAL | 0 refills | Status: DC

## 2017-12-30 MED ORDER — OXYCODONE-ACETAMINOPHEN 5-325 MG PO TABS: 1.0000 | ORAL_TABLET | Freq: Four times a day (QID) | ORAL | 0 refills | Status: DC | PRN

## 2017-12-30 NOTE — Progress Notes (Signed)
Progress Note  Patient Name: Renee Mack Date of Encounter: 12/30/2017  Primary Cardiologist: Fletcher Anon  Subjective   Transferred to telemetry on 7/11 following weaning of NE gtt (unclear why this was started as her hypotension has been a chronic issue). BP has remained soft, though stable since being transferred to telemetry. Renal function normal this morning. Glucose elevated at 331.   No chest pain or SOB. Has ambulated without issues. Hand numbness resolved. Mild headache.   Inpatient Medications    Scheduled Meds: . aspirin EC  81 mg Oral Daily  . atorvastatin  80 mg Oral q1800  . citalopram  20 mg Oral Daily  . digoxin  0.125 mg Oral Daily  . enoxaparin (LOVENOX) injection  40 mg Subcutaneous Q24H  . hydrocortisone sod succinate (SOLU-CORTEF) inj  50 mg Intravenous Q6H  . insulin aspart  0-9 Units Subcutaneous TID WC  . insulin glargine  60 Units Subcutaneous QHS  . lidocaine  1 patch Transdermal Q24H  . LORazepam  1 mg Oral Q8H  . midodrine  2.5 mg Oral TID WC  . sodium chloride flush  3 mL Intravenous Q12H  . ticagrelor  90 mg Oral BID   Continuous Infusions: . sodium chloride    . norepinephrine (LEVOPHED) Adult infusion Stopped (12/29/17 1320)   PRN Meds: sodium chloride, acetaminophen, acetaminophen, albuterol, docusate sodium, nitroGLYCERIN, ondansetron (ZOFRAN) IV, oxyCODONE-acetaminophen, sodium chloride flush   Vital Signs    Vitals:   12/29/17 1700 12/29/17 1800 12/29/17 1920 12/30/17 0317  BP: (!) 84/48 (!) 98/57 (!) 99/59 (!) 89/55  Pulse: 71 71 63 69  Resp:  _0 Temp:   98.3 F (36.8 C) 98 F (36.7 C)  TempSrc:   Oral Oral  SpO2: 93% 96% 95% 96%  Weight:   137 lb (62.1 kg) 137 lb (62.1 kg)  Height:   _1  (1.549 m)     Intake/Output Summary (Last 24 hours) at 12/30/2017 0722 Last data filed at 12/30/2017 0317 Gross per 24 hour  Intake 780.08 ml  Output 1700 ml  Net -919.92 ml   Filed Weights   12/28/17 0332 12/29/17 1920  12/30/17 0317  Weight: 136 lb 14.5 oz (62.1 kg) 137 lb (62.1 kg) 137 lb (62.1 kg)    Telemetry    Sinus bradycardia to NSR - Personally Reviewed  ECG    n/a - Personally Reviewed  Physical Exam   GEN: No acute distress.   Neck: No JVD. Cardiac: RRR, no murmurs, rubs, or gallops. Left radial cardiac cath site is well healing without bleeding, bruising, swelling, erythema, or warmth. Mild TTP. Normal sensation of the hand with full strength. Radial pulse 2+. Respiratory: Clear to auscultation bilaterally.  GI: Soft, nontender, non-distended.   MS: No edema; No deformity. Neuro:  Alert and oriented x 3; Nonfocal.  Psych: Normal affect.  Labs    Chemistry Recent Labs  Lab 12/26/17 1112 12/27/17 0313 12/28/17 0509 12/30/17 0418  NA 138 137 140 134*  K 3.9 3.7 3.7 4.1  CL 105 108 110 102  CO2 _2 GLUCOSE 304* 180* 142* 331*  BUN _3 CREATININE 0.64 0.44 0.40* 0.53  CALCIUM 9.0 8.5* 8.5* 9.3  PROT 7.1  --   --   --   ALBUMIN 3.4*  --   --   --   AST 18  --   --   --   ALT 18  --   --   --  ALKPHOS 96  --   --   --   BILITOT 0.4  --   --   --   GFRNONAA >60 >60 >60 >60  GFRAA >60 >60 >60 >60  ANIONGAP _0 Hematology Recent Labs  Lab 12/26/17 1112 12/27/17 0313  WBC 10.4 8.3  RBC 4.13 3.79*  HGB 13.5 12.5  HCT 38.4 36.3  MCV 93.0 95.7  MCH 32.7 33.0  MCHC 35.1 34.5  RDW 13.9 13.8  PLT 287 237    Cardiac Enzymes Recent Labs  Lab 12/26/17 1112 12/26/17 1733 12/26/17 2248 12/27/17 0313  TROPONINI 0.56* 0.84* 1.05* 0.98*   No results for input(s): TROPIPOC in the last 168 hours.   BNP Recent Labs  Lab 12/27/17 0506  BNP 714.0*     DDimer No results for input(s): DDIMER in the last 168 hours.   Radiology    No results found.  Cardiac Studies   Cardiac Catheterization and Percutaneous Coronary Intervention7.8.2019      Left Main  The vessel exhibits minimal luminal irregularities.  Left Anterior  Descending  Prox LAD to Mid LAD lesion 100% stenosed  Prox LAD to Mid LAD lesion is 100% stenosed. The lesion is thrombotic. The lesion was previously treated using a drug eluting stent between 2-30 days ago. Previously placed stent displays rethrombosis.  Mid LAD lesion 70% stenosed  Mid LAD lesion is 70% stenosed.  Dist LAD lesion 10% stenosed  Dist LAD lesion is 10% stenosed. Vessel is not the culprit lesion. The lesion was previously treated.  Second Diagonal [employer] 2nd Diag to 2nd Diag lesion 90% stenosed  Ost 2nd Diag to 2nd Diag lesion is 90% stenosed.  Left Circumflex    Prox Cx lesion 30% stenosed    Prox Cx lesion is 30% stenosed.    Left Posterior Descending Artery  The vessel exhibits minimal luminal irregularities.  First Left Posterolateral Branch  The vessel exhibits minimal luminal irregularities.  Second Left Posterolateral Branch  The vessel exhibits minimal luminal irregularities.  Right Coronary Artery   Prox RCA lesion 90% stenosed   Prox RCA lesion is 90% stenosed.   Intervention   Prox LAD to Mid LAD lesion  Angioplasty  Lesion length: 35 mm. Balloon angioplasty was performed using a BALLOON [place] RX2.5X20. Maximum pressure: 18 atm. Inflation time: 60 sec.  Post-Intervention Lesion Assessment  The intervention was successful. Pre-interventional TIMI flow is 0. Post-intervention TIMI flow is 3. No complications occurred at this lesion.  There is a 10% residual stenosis post intervention.  Mid LAD lesion  Stent  Lesion length: 15 mm. Lesion crossed with guidewire using a WIRE HI TORQ [place] 190CM. Pre-stent angioplasty was performed using a BALLOON [place] RX 2.0X20. A drug-eluting stent was successfully placed using a STENT RESOLUTE ONYX 2.0X18. Maximum pressure: 14 atm. Inflation time: 30 sec. Stent overlaps previously placed stent.  Post-Intervention Lesion Assessment  The intervention was successful. Pre-interventional TIMI flow is  0. Post-intervention TIMI flow is 3. No complications occurred at this lesion.  There is a 15% residual stenosis post intervention.  Dist LAD lesion  Post-Intervention Lesion Assessment  The intervention was successful. Pre-interventional TIMI flow is 0. Post-intervention TIMI flow is 3. No complications occurred at this lesion.  There is no residual stenosis post intervention.    Patient Profile     52 y.o. female with history of CAD s/p recent MI with DESLAD x 3, combined CHF/ICM w/ an EF  of 30-35%, tob abuse, COPD, HL, GERD, DMII, and pancreatitis, whowas readmitted on 7/8 with recurrent chest pain and sub-acute stent thrombosis.  Assessment & Plan    1. NSTEMI/CAD: -Chest pain free -Urgent cath this admission showed sub--acute thrombosis within LAD req repeat PTCA and DES to the mid LAD -Continue ASA, Brilinta, and Lipitor -Not on beta blocker as below  2. Hypotension: -BP continues to run low in the 59T systolic, though stable -Unable to take any evidence-based heart failure medications 2/2 soft BP -Continue low dose midodrine 2.5 mg tid that was started on 7/10, if needed consider escalation to 5 mg tid -It remains unclear why she was started on NE, nonetheless this has been weaned with continued stable BP  3. HFrEF/ICM: -She does not appear grossly volume up  -Unfortunately, her hypotension has precluded evidence-based heart failure therapy -LifeVest was placed at last discharge, continue at discharge from this admission  -Not able to use evidence-based therapies for heart failure due to hypotension -Started low dose digoxin 7/11, follow up outpatient digoxin level  4. L hand numbness: -Improved  5. HLD: -LDL 143 on 7/1 with normal LFTs -Continue high-intensity statin with plan to recheck as an outpatient   For questions or updates, please contact [place] Please consult [email or web address] for contact info under Cardiology/STEMI.    Signed, Christell Faith,  PA-C [place] Pager: [phone] 12/30/2017, 7:22 AM

## 2017-12-30 NOTE — Progress Notes (Signed)
Cardiovascular and Pulmonary Nurse Navigator Note:    52 year old with hx of Vul[place] cancer, combined systolic and diastolic CHF/ICM with EF of 30-35% - patient wearing Life Vest.  CAD s/p recent MI with DES to LAD x 3, tobacco abuse, COPD, HLD, GERD, DMII and pancreatitis who was readmitted on 7/8 with recurrent chest pain and sub-acute stent thrombosis.  Urgent cath this admission revealed sub-acute thrombosis within LAD requiring repeat PTCA and DES to the mid LAD.    Heart Attack Bouncing Back" booklet given and reviewed with patient. Discussed the definition of CAD. Reviewed the location of CAD and where stents were placed. Informed patient she will be given another stent card. Explained the purpose of the stent card. Instructed patient to keep stent card in her wallet.  ? Discussed modifiable risk factors including controlling blood pressure, cholesterol, and blood sugar; following heart healthy diet; maintaining healthy weight; exercise; and smoking cessation, if applicable.  ? Discussed cardiac medications including rationale for taking, mechanisms of action, and side effects. Stressed the importance of taking medications as prescribed.  ? Discussed emergency plan for heart attack symptoms. Patient verbalized understanding of need to call 911 and not to drive himself to ER if having cardiac symptoms / chest pain.  ? Heart healthy diet of low sodium, low fat, low cholesterol carb modified heart healthy diet discussed. Information on diet provided. Stressed the importance of following low sodium diet, eating fresh fruits and vegetables, lean cuts of meats, low fat dairy, fish, and whole grains.  Reviewed reading food labels with patient.   Also discussed fluid intake given her CHF/ICM.  Instructed patient to weigh herself daily and report to MD/Cardiologist for weight gain of 2 pounds overnight or 5 pounds in one week.  NOTE:  Patient plans to purchase scales.   ? Smoking Cessation - Patient  reports she quit smoking when she was admitted last week (12/19/2017) with STEMI.  Has not smoked since.    ? Exercise - Benefits of exercised discussed. Informed patient that her cardiologist has referred her to outpatient Cardiac Rehab. An overview of the program was provided. Informational letter, class and orientation times, and CPT billing codes given to patient. Patient is interested in participating.  The barrier for patient starting Cardiac Rehab will be her low BP. Patient is taking Midodrine 2.5 mg TID for hypotension.  Patient was just a little light headed when walking earlier today.  Patient has [employer] [place] for her insurance coverage.  Patient stated that Medicaid usually covers all of her medical needs with low co-pay.      Low BP  - Patient has blood pressure cuff at home. Reinforced to patient the importance of changing positions slowly and not jump up quickly.   Instructed patient to take her BP before every dose of Midodrine and record in order to provide the MDs with information of what her BP is doing throughout the day. Patient also to make note if she is having any symptoms - light headedness or dizziness.  I also instructed patient to weigh daily in the mornings, after urinating and in the same amount of clothing. Patient to record daily weights as well.  Patient to bring this information with her when she goes to see her physicians.    Had lengthy discussion with patient where patient was talking about "It's really time for me to take care of myself because I want to remain independent for as long a possible."  Patient appears motivated to  risk factor modification, as she shared with me that she turned down a [employer] prior to admission and patient has quit smoking.    Patient appreciative of the information.  ? Roanna Epley, RN, BSN, [place]  [facility] Cardiac & Pulmonary Rehab  Cardiovascular & Pulmonary Nurse Navigator  Direct Line: [phone]  Department  Phone #: [phone] Fax: [phone]  Email Address: [email or web address]

## 2017-12-30 NOTE — Discharge Summary (Signed)
[facility] [place] NAME: Lennon Richins    MR#:  [id number]  DATE OF BIRTH:  07/21/1971  DATE OF ADMISSION:  12/26/2017 ADMITTING PHYSICIAN: Vaughan Basta, MD  DATE OF DISCHARGE: 12/30/2017  PRIMARY CARE PHYSICIAN: Ro, [name], MD    ADMISSION DIAGNOSIS:  Unstable angina ([place]) [I20.0]  DISCHARGE DIAGNOSIS:  Principal Problem:   Ischemic chest pain Active Problems:   Chest pain   Unstable angina (HCC)   Non-ST elevation (NSTEMI) myocardial infarction ([place])   Chronic systolic heart failure ([place])   SECONDARY DIAGNOSIS:   Past Medical History:  Diagnosis Date  . Cancer ([place])    vulvular  . Chronic combined systolic (congestive) and diastolic (congestive) heart failure ([place])    a. 12/2017 Echo: EF 30-35%, mid-apicalanteroseptal, ant, and apical AK. Gr1 DD. Mild conc LVH.  Marland Kitchen COPD (chronic obstructive pulmonary disease) (HCC)    not on home oxygen  . Coronary artery disease    a. 12/2017 ACS/PCI: LM nl, LAD 100p (2.25x26 Resolute Onyx DES), 19m (2.0x12 Resolute Onyx DES), 80d (2.0x15 Resolute Onyx DES), LCX 30p, RCA 90p (non-dominant). EF 25-35%. Post-MI course complicated by cardiogenic shock req vasopressor Rx.  . Diabetes mellitus without complication ([place])   . GERD (gastroesophageal reflux disease)   . HLD (hyperlipidemia)   . Ischemic cardiomyopathy    a. 12/2017 Echo: EF 30-35%.  . Myocardial infarction ([place])    a. 12/2017-->DES to LAD x 3.  . Pancreatitis   . Shingles   . Tobacco abuse     HOSPITAL COURSE:   1.  Subacute in-stent thrombosis with prior STEMI.  Patient had a stent placed in the LAD on this hospitalization.  Continue aspirin, Brilinta and atorvastatin.  Patient's blood pressure too low for any other cardiac meds.  Cardiac rehab to be set up as outpatient. 2.  Relative hypotension.  The patient was started on midodrine.  Patient unable to tolerate Toprol.  The patient was actually in the ICU on Levophed and  stress dose steroids for period of time.  I asked the nurse to stop the Levophed and I also stopped the steroids.  The patient's blood pressure is on the lower side and I think this is at her normal blood pressure.  Patient ambulated today without a problem.  Advised to sit down quickly if she does get dizzy or lightheaded. 3.  Hyperlipidemia unspecified on Lipitor.  LDL acceptable at 72. 4.  Depression on Celexa 5.  Type 2 diabetes mellitus on glargine insulin and sliding scale 6.  Cardiomyopathy.  Patient prescribed a LifeVest on last discharge.  She has it here for discharge home.  Blood pressure too low for any cardiac meds at this time.  Prognosis for cardiomyopathy and unable to take medications is poor. 7.  Left hand numbness.  This has resolved.  This occurred with a cardiac cath in her left wrist and the tight pressure band dressing that was needed to prevent bleeding. 8.  Patient requesting a few pain pills until she can get to her doctor.  I only prescribed a few pills. 9.  Patient requesting a nicotine patch upon leaving the hospital.   DISCHARGE CONDITIONS:   Fair  CONSULTS OBTAINED:  Treatment Team:  Wellington Hampshire, MD  DRUG ALLERGIES:   Allergies  Allergen Reactions  . Bee Venom Itching, Shortness Of Breath and Swelling  . Metformin And Related Shortness Of Breath  . Darvon [Propoxyphene] Itching  . Gabapentin Swelling  .  Nsaids Other (See Comments)    Ulcers   . Tramadol Hives    DISCHARGE MEDICATIONS:   Allergies as of 12/30/2017      Reactions   Bee Venom Itching, Shortness Of Breath, Swelling   Metformin And Related Shortness Of Breath   Darvon [propoxyphene] Itching   Gabapentin Swelling   Nsaids Other (See Comments)   Ulcers   Tramadol Hives      Medication List    STOP taking these medications   metoprolol succinate 25 MG 24 hr tablet Commonly known as:  TOPROL XL     TAKE these medications   acetaminophen 325 MG tablet Commonly known as:   TYLENOL Take 650 mg by mouth every 6 (six) hours as needed for mild pain.   aspirin 81 MG tablet Take 81 mg by mouth daily.   atorvastatin 80 MG tablet Commonly known as:  LIPITOR Take 1 tablet (80 mg total) by mouth daily at 6 PM.   citalopram 20 MG tablet Commonly known as:  CELEXA Take 20 mg daily by mouth.   digoxin 0.125 MG tablet Commonly known as:  LANOXIN Take 1 tablet (0.125 mg total) by mouth daily.   insulin aspart 100 UNIT/ML injection Commonly known as:  novoLOG Inject 10 Units into the skin 3 (three) times daily before meals. Take 10 units PLUS sliding scale   insulin glargine 100 UNIT/ML injection Commonly known as:  LANTUS Inject 60 Units into the skin at bedtime.   LORazepam 1 MG tablet Commonly known as:  ATIVAN Take 1 mg by mouth every 8 (eight) hours.   midodrine 2.5 MG tablet Commonly known as:  PROAMATINE Take 1 tablet (2.5 mg total) by mouth 3 (three) times daily with meals.   nicotine 14 mg/24hr patch Commonly known as:  NICODERM CQ - dosed in mg/24 hours Place 1 patch (14 mg total) onto the skin daily. Okay to substitute generic patch   oxyCODONE-acetaminophen 5-325 MG tablet Commonly known as:  PERCOCET/ROXICET Take 1 tablet by mouth every 6 (six) hours as needed for moderate pain.   PROAIR HFA 108 (90 Base) MCG/ACT inhaler Generic drug:  albuterol Inhale 1 puff into the lungs every 6 (six) hours as needed for wheezing or shortness of breath.   ticagrelor 90 MG Tabs tablet Commonly known as:  BRILINTA Take 1 tablet (90 mg total) by mouth 2 (two) times daily.        DISCHARGE INSTRUCTIONS:   Follow-up PMD 6 days Follow-up cardiology 1 week  If you experience worsening of your admission symptoms, develop shortness of breath, life threatening emergency, suicidal or homicidal thoughts you must seek medical attention immediately by calling 911 or calling your MD immediately  if symptoms less severe.  You Must read complete  instructions/literature along with all the possible adverse reactions/side effects for all the Medicines you take and that have been prescribed to you. Take any new Medicines after you have completely understood and accept all the possible adverse reactions/side effects.   Please note  You were cared for by a hospitalist during your hospital stay. If you have any questions about your discharge medications or the care you received while you were in the hospital after you are discharged, you can call the unit and asked to speak with the hospitalist on call if the hospitalist that took care of you is not available. Once you are discharged, your primary care physician will handle any further medical issues. Please note that NO REFILLS for any discharge  medications will be authorized once you are discharged, as it is imperative that you return to your primary care physician (or establish a relationship with a primary care physician if you do not have one) for your aftercare needs so that they can reassess your need for medications and monitor your lab values.    Today   CHIEF COMPLAINT:   Chief Complaint  Patient presents with  . Code STEMI    HISTORY OF PRESENT ILLNESS:  Jace Dowe  is a 52 y.o. female presented back to the hospital with chest pain   VITAL SIGNS:  Blood pressure (!) 86/42, pulse 67, temperature 97.9 F (36.6 C), temperature source Oral, resp. rate 18, height 5\' 1"  (1.549 m), weight 62.1 kg (137 lb), SpO2 98 %.    PHYSICAL EXAMINATION:  GENERAL:  52 y.o.-year-old patient lying in the bed with no acute distress.  EYES: Pupils equal, round, reactive to light and accommodation. No scleral icterus. Extraocular muscles intact.  HEENT: Head atraumatic, normocephalic. Oropharynx and nasopharynx clear.  NECK:  Supple, no jugular venous distention. No thyroid enlargement, no tenderness.  LUNGS: Normal breath sounds bilaterally, no wheezing, rales,rhonchi or crepitation. No use  of accessory muscles of respiration.  CARDIOVASCULAR: S1, S2 normal. No murmurs, rubs, or gallops.  ABDOMEN: Soft, non-tender, non-distended. Bowel sounds present. No organomegaly or mass.  EXTREMITIES: No pedal edema, cyanosis, or clubbing.  NEUROLOGIC: Cranial nerves II through XII are intact. Muscle strength 5/5 in all extremities. Sensation intact. Gait not checked.  PSYCHIATRIC: The patient is alert and oriented x 3.  SKIN: No obvious rash, lesion, or ulcer.   DATA REVIEW:   CBC Recent Labs  Lab 12/27/17 0313  WBC 8.3  HGB 12.5  HCT 36.3  PLT 237    Chemistries  Recent Labs  Lab 12/26/17 1112  12/30/17 0418  NA 138   < > 134*  K 3.9   < > 4.1  CL 105   < > 102  CO2 24   < > 24  GLUCOSE 304*   < > 331*  BUN 15   < > 18  CREATININE 0.64   < > 0.53  CALCIUM 9.0   < > 9.3  AST 18  --   --   ALT 18  --   --   ALKPHOS 96  --   --   BILITOT 0.4  --   --    < > = values in this interval not displayed.    Cardiac Enzymes Recent Labs  Lab 12/27/17 0313  TROPONINI 0.98*     Management plans discussed with the patient, and she is in agreement.  CODE STATUS:     Code Status Orders  (From admission, onward)        Start     Ordered   12/26/17 1538  Full code  Continuous     12/26/17 1538    Code Status History    Date Active Date Inactive Code Status Order ID Comments User Context   12/26/2017 1537 12/26/2017 1538 Full Code [id number]  Vaughan Basta, MD Inpatient   12/19/2017 0839 12/21/2017 1940 Full Code [id number]  Wellington Hampshire, MD Inpatient   11/03/2017 1031 11/04/2017 1850 Full Code [id number]  Fritzi Mandes, MD Inpatient   09/28/2017 1044 09/29/2017 2121 Full Code [id number]  Harrie Foreman, MD ED   07/17/2017 2004 07/20/2017 1840 Full Code [id number]  Henreitta Leber, MD ED   06/12/2017 2227 06/16/2017 1910 Full Code [id number]  Gorden Harms, MD Inpatient   12/28/2014 0305 12/28/2014 1919 Full Code [id number]  Lance Coon, MD Inpatient      TOTAL  TIME TAKING CARE OF THIS PATIENT: 35 minutes.    Loletha Grayer M.D on 12/30/2017 at 3:37 PM  Between 7am to 6pm - Pager - [phone]  After 6pm go to [email or web address] - password [email or web address]  [facility] Office  [phone]  CC: Primary care physician; Carolyn Stare [name], MD

## 2017-12-30 NOTE — Progress Notes (Signed)
Inpatient Diabetes Program Recommendations  AACE/ADA: New Consensus Statement on Inpatient Glycemic Control (2019)  Target Ranges:  Prepandial:   less than 140 mg/dL      Peak postprandial:   less than 180 mg/dL (1-2 hours)      Critically ill patients:  140 - 180 mg/dL  Results for Renee, Mack (MRN [id number]) as of 12/30/2017 08:57  Ref. Range 12/29/2017 07:36 12/29/2017 11:54 12/29/2017 16:40 12/29/2017 20:42 12/30/2017 08:03  Glucose-Capillary Latest Ref Range: 70 - 99 mg/dL 218 (H) 192 (H) 275 (H) 317 (H) 284 (H)   Results for Renee, Mack (MRN [id number]) as of 12/30/2017 08:57  Ref. Range 06/12/2017 15:37 12/19/2017 09:34  Hemoglobin A1C Latest Ref Range: 4.8 - 5.6 % 15.8 (H) 12.1 (H)   Review of Glycemic Control  Outpatient Diabetes medications:Lantus 60 units QHS, Novolog 10 units TID with meals Current orders for Inpatient glycemic control: Lantus 60 units QHS, Novolog 0-9 units TID with meals  Inpatient Diabetes Program Recommendations: Correction (SSI): If patient is not discharged today, please consider ordering Novolog 0-5 units QHS for bedtime correction scale. HgbA1C: A1C 12.1% on 12/19/17 indicating an average glucose of 301 mg/dl. Inpatient Diabetes Coordinator spoke with patient on 12/20/17 during last admission regarding A1C and DM control.  NOTE: Noted patient was ordered steroids which have been discontinued. Anticipate glucose to improve since no other steroids are ordered.  Thanks, Barnie Alderman, RN, MSN, CDE Diabetes Coordinator Inpatient Diabetes Program [phone] (Team Pager from 8am to 5pm)

## 2017-12-30 NOTE — Progress Notes (Signed)
Patient ambulated down the hall, around the nurses station, and back.  Steady gate, no wobbling.  She reported feeling "a little woozy" but otherwise okay.  RN asked for clarification and she listed all the people who live near her.  RN stated that we do not want her go home and come right back, she needs to be strong enough to care for herself.  Patient agreed and stated that she was feeling better.  Phillis Knack, RN

## 2017-12-30 NOTE — Care Management (Signed)
No discharge needs identified by members of the care team 

## 2017-12-30 NOTE — Discharge Instructions (Signed)
Acute Coronary Syndrome °Acute coronary syndrome (ACS) is a serious problem in which there is suddenly not enough blood and oxygen reaching the heart. ACS can result in chest pain or a heart attack. °What are the causes? °This condition may be caused by: °· A buildup of fat and cholesterol inside of the arteries (atherosclerosis). This is the most common cause. The buildup (plaque) can cause the blood vessels in your heart (coronary arteries) to become narrow or blocked. Plaque can also break off to form a clot. °· A coronary spasm. °· A tearing of the coronary artery (spontaneous coronary artery dissection). °· Low blood pressure (hypotension). °· An abnormal heart beat (arrhythmia). °· Using cocaine or methamphetamine. ° °What increases the risk? °The following factors may make you more likely to develop this condition: °· Age. °· History of chest pain, heart attack, or stroke. °· Being female. °· Family history of chest pain, heart disease, or stroke. °· Smoking. °· Inactivity. °· Being overweight. °· High cholesterol. °· High blood pressure (hypertension). °· Diabetes. °· Excessive alcohol use. ° °What are the signs or symptoms? °Common symptoms of this condition include: °· Chest pain. The pain may last long, or may stop and come back (recur). It may feel like: °? Crushing or squeezing. °? Tightness, pressure, fullness, or heaviness. °· Arm, neck, jaw, or back pain. °· Heartburn or indigestion. °· Shortness of breath. °· Nausea. °· Sudden cold sweats. °· Lightheadedness. °· Dizziness. °· Tiredness (fatigue). ° °Sometimes there are no symptoms. °How is this diagnosed? °This condition may be diagnosed through: °· An electrocardiogram (ECG). This test records the impulses of the heart. °· Blood tests. °· A CT scan of the chest. °· A coronary angiogram. This procedure checks for a blockage in the coronary arteries. ° °How is this treated? °Treatment for this condition may include: °· Oxygen. °· Medicines, such  as: °? Antiplatelet medicines and blood-thinning medicines, such as aspirin. These help prevent blood clots. °? Fibrinolytic therapy. This breaks apart a blood clot. °? Blood pressure medicines. °? Nitroglycerin. °? Pain medicine. °? Cholesterol medicine. °· A procedure called coronary angioplasty and stenting. This is done to widen a narrowed artery and keep it open. °· Coronary artery bypass surgery. This allows blood to pass the blockage to reach your heart. °· Cardiac rehabilitation. This is a program that helps improve your health and well-being. It includes exercise training, education, and counseling to help you recover. ° °Follow these instructions at home: °Eating and drinking °· Follow a heart-healthy, low-salt (sodium) diet. °· Use healthy cooking methods such as roasting, grilling, broiling, baking, poaching, steaming, or stir-frying. °· Talk to a dietitian to learn about healthy cooking methods and how to eat less sodium. °Medicines °· Take over-the-counter and prescription medicines only as told by your health care provider. °· Do not take these medicines unless your health care provider approves: °? Nonsteroidal anti-inflammatory drugs (NSAIDs), such as ibuprofen, naproxen, or celecoxib. °? Vitamin supplements that contain vitamin A or vitamin E. °? Hormone replacement therapy that contains estrogen. °Activity °· Join a cardiac rehabilitation program. °· Ask your health care provider: °? What activities and exercises are safe for you. °? If you should follow specific instructions about lifting, driving, or climbing stairs. °· If you are taking aspirin and another blood thinning medicine, avoid activities that are likely to result in an injury. The medicines can increase your risk of bleeding. °Lifestyle °· Do not use any products that contain nicotine or tobacco, such as cigarettes   and e-cigarettes. If you need help quitting, ask your health care provider. °· If you drink alcohol and your health care  provider says it is okay to drink, limit your alcohol intake to no more than 1 drink per day. One drink equals 12 oz of beer, 5 oz of wine, or 1½ oz of hard liquor. °· Maintain a healthy weight. If you need to lose weight, do it in a way that has been approved by your health care provider. °General instructions °· Tell all your health care providers about your heart condition, including your dentist. Some medicines can increase your risk of arrhythmia. °· Manage other health conditions, such as hypertension and diabetes. These conditions affect your heart. °· Learn ways to manage stress. °· Get screened for depression, and seek treatment if needed. °· Monitor your blood pressure if told by your health care provider. °· Keep your vaccinations up to date. Get the annual influenza vaccine. °· Keep all follow-up visits as told by your health care provider. This is important. °Contact a health care provider if: °· You feel overwhelmed or sad. °· You have trouble with your daily activities. °Get help right away if: °· You have pain in your chest, neck, arm, jaw, stomach, or back that recurs, and: °? Lasts more than a few minutes. °? Is not relieved by taking the medicineyour health care provider prescribed. °· You have unexplained: °? Heavy sweating. °? Heartburn or indigestion. °? Shortness of breath. °? Difficulty breathing. °? Nausea or vomiting. °? Fatigue. °? Nervousness or anxiety. °? Weakness. °? Diarrhea. °? Dark stools or blood in the stool. °· You have sudden lightheadedness or dizziness. °· Your blood pressure is higher than 180/120 °· You faint. °· You feel like hurting yourself or think about taking your own life. °These symptoms may represent a serious problem that is an emergency. Do not wait to see if the symptoms will go away. Get medical help right away. Call your local emergency services (911 in the [place] Do not drive yourself to the clinic or hospital. °Summary °· Acute coronary syndrome (ACS) is a  when there is not enough blood and oxygen being supplied to the heart. ACS can result in chest pain or a heart attack. °· Acute coronary syndrome is a medical emergency. If you have any symptoms of this condition, get help right away. °· Treatment includes oxygen, medicines, and procedures to open the blocked arteries and restore blood flow. °This information is not intended to replace advice given to you by your health care provider. Make sure you discuss any questions you have with your health care provider. °Document Released: 06/07/2005 Document Revised: 07/09/2016 Document Reviewed: 07/09/2016 °Elsevier Interactive Patient Education © 2018 [employer]vier Inc. ° °

## 2018-01-05 ENCOUNTER — Emergency Department: Payer: Medicaid Other

## 2018-01-05 ENCOUNTER — Emergency Department
Admission: EM | Admit: 2018-01-05 | Discharge: 2018-01-05 | Disposition: A | Discharge status: Home / Self Care | Payer: Medicaid Other | Attending: Emergency Medicine | Admitting: Emergency Medicine

## 2018-01-05 ENCOUNTER — Other Ambulatory Visit: Payer: Self-pay

## 2018-01-05 DIAGNOSIS — Z8544 Personal history of malignant neoplasm of other female genital organs: Secondary | ICD-10-CM | POA: Insufficient documentation

## 2018-01-05 DIAGNOSIS — I5042 Chronic combined systolic (congestive) and diastolic (congestive) heart failure: Secondary | ICD-10-CM | POA: Insufficient documentation

## 2018-01-05 DIAGNOSIS — R109 Unspecified abdominal pain: Secondary | ICD-10-CM

## 2018-01-05 DIAGNOSIS — Z79899 Other long term (current) drug therapy: Secondary | ICD-10-CM | POA: Diagnosis not present

## 2018-01-05 DIAGNOSIS — Z87891 Personal history of nicotine dependence: Secondary | ICD-10-CM | POA: Insufficient documentation

## 2018-01-05 DIAGNOSIS — N10 Acute tubulo-interstitial nephritis: Secondary | ICD-10-CM | POA: Diagnosis not present

## 2018-01-05 DIAGNOSIS — Z794 Long term (current) use of insulin: Secondary | ICD-10-CM | POA: Insufficient documentation

## 2018-01-05 DIAGNOSIS — R1030 Lower abdominal pain, unspecified: Secondary | ICD-10-CM | POA: Diagnosis present

## 2018-01-05 DIAGNOSIS — I252 Old myocardial infarction: Secondary | ICD-10-CM | POA: Diagnosis not present

## 2018-01-05 DIAGNOSIS — I2510 Atherosclerotic heart disease of native coronary artery without angina pectoris: Secondary | ICD-10-CM | POA: Diagnosis not present

## 2018-01-05 DIAGNOSIS — J449 Chronic obstructive pulmonary disease, unspecified: Secondary | ICD-10-CM | POA: Diagnosis not present

## 2018-01-05 DIAGNOSIS — N12 Tubulo-interstitial nephritis, not specified as acute or chronic: Secondary | ICD-10-CM

## 2018-01-05 DIAGNOSIS — E119 Type 2 diabetes mellitus without complications: Secondary | ICD-10-CM | POA: Insufficient documentation

## 2018-01-05 DIAGNOSIS — Z7982 Long term (current) use of aspirin: Secondary | ICD-10-CM | POA: Insufficient documentation

## 2018-01-05 LAB — COMPREHENSIVE METABOLIC PANEL
ALBUMIN: 4.3 g/dL (ref 3.5–5.0)
ALT: 27 U/L (ref 0–44)
ANION GAP: 11 (ref 5–15)
AST: 16 U/L (ref 15–41)
Alkaline Phosphatase: 140 U/L — ABNORMAL HIGH (ref 38–126)
BILIRUBIN TOTAL: 0.6 mg/dL (ref 0.3–1.2)
BUN: 15 mg/dL (ref 6–20)
CO2: 22 mmol/L (ref 22–32)
Calcium: 9.8 mg/dL (ref 8.9–10.3)
Chloride: 102 mmol/L (ref 98–111)
Creatinine, Ser: 0.56 mg/dL (ref 0.44–1.00)
GFR calc Af Amer: >60 mL/min (ref >60)
GFR calc non Af Amer: >60 mL/min (ref >60)
GLUCOSE: 371 mg/dL — AB (ref 70–99)
POTASSIUM: 4.6 mmol/L (ref 3.5–5.1)
Sodium: 135 mmol/L (ref 135–145)
TOTAL PROTEIN: 8.6 g/dL — AB (ref 6.5–8.1)

## 2018-01-05 LAB — CBC
HEMATOCRIT: 43.7 % (ref 35.0–47.0)
HEMOGLOBIN: 14.8 g/dL (ref 12.0–16.0)
MCH: 32.3 pg (ref 26.0–34.0)
MCHC: 34 g/dL (ref 32.0–36.0)
MCV: 95 fL (ref 80.0–100.0)
Platelets: 350 10*3/uL (ref 150–440)
RBC: 4.6 MIL/uL (ref 3.80–5.20)
RDW: 14 % (ref 11.5–14.5)
WBC: 9.6 10*3/uL (ref 3.6–11.0)

## 2018-01-05 LAB — URINALYSIS, COMPLETE (UACMP) WITH MICROSCOPIC
BACTERIA UA: NONE SEEN
BILIRUBIN URINE: NEGATIVE
Glucose, UA: >=500 mg/dL — AB
KETONES UR: NEGATIVE mg/dL
NITRITE: NEGATIVE
Protein, ur: NEGATIVE mg/dL
Specific Gravity, Urine: 1.017 (ref 1.005–1.030)
pH: 6 (ref 5.0–8.0)

## 2018-01-05 LAB — LIPASE, BLOOD: Lipase: 21 U/L (ref 11–51)

## 2018-01-05 MED ORDER — ONDANSETRON HCL 4 MG/2ML IJ SOLN
4.0000 mg | Freq: Once | INTRAMUSCULAR | Status: AC
  Administered 2018-01-05: 4 mg via INTRAVENOUS
  Filled 2018-01-05: qty 2

## 2018-01-05 MED ORDER — SODIUM CHLORIDE 0.9 % IV BOLUS
1000.0000 mL | Freq: Once | INTRAVENOUS | Status: AC
  Administered 2018-01-05: 1000 mL via INTRAVENOUS

## 2018-01-05 MED ORDER — CEPHALEXIN 500 MG PO CAPS
500.0000 mg | ORAL_CAPSULE | Freq: Once | ORAL | Status: AC
  Administered 2018-01-05: 500 mg via ORAL
  Filled 2018-01-05: qty 1

## 2018-01-05 MED ORDER — ONDANSETRON HCL 4 MG PO TABS: 4.0000 mg | ORAL_TABLET | Freq: Every day | ORAL | 0 refills | Status: DC | PRN

## 2018-01-05 MED ORDER — IOHEXOL 300 MG/ML  SOLN
100.0000 mL | Freq: Once | INTRAMUSCULAR | Status: AC | PRN
  Administered 2018-01-05: 100 mL via INTRAVENOUS

## 2018-01-05 MED ORDER — MORPHINE SULFATE (PF) 4 MG/ML IV SOLN
4.0000 mg | Freq: Once | INTRAVENOUS | Status: AC
  Administered 2018-01-05: 4 mg via INTRAVENOUS
  Filled 2018-01-05: qty 1

## 2018-01-05 MED ORDER — CEPHALEXIN 500 MG PO CAPS: 500.0000 mg | ORAL_CAPSULE | Freq: Three times a day (TID) | ORAL | 0 refills | Status: DC

## 2018-01-05 NOTE — ED Notes (Signed)
Informed RN that patient has been roomed and is ready for evaluation.  Patient in NAD at this time and call bell placed within reach.   

## 2018-01-05 NOTE — ED Notes (Signed)
Fluids not infusing. MD aware. Pt is able to tolerate PO fluids. MD is ok with d/c.

## 2018-01-05 NOTE — ED Notes (Signed)
Pt c/o low abdominal/pelvic pain on both sides, although left worse than right. Pain wraps around to left lower back. Hx DM; hx hospitalizations for DKA. Pt reports "I think I have a UTI. I have diabetes, so I get frequent UTIs."

## 2018-01-05 NOTE — ED Triage Notes (Addendum)
Pt alert, oriented, ambulatory. States strong urine smell and sick on stomach. Hx vaginal CA, not receiving treatment. States hematuria. Pt states "I don't know if I have DKA or UTI." symptoms since yesterday. States CBG at home was in 300's. Friend states fever at home. C/o LLQ abd pain and back pain.  Friend states "2 major heart attacks in 2 weeks."

## 2018-01-05 NOTE — ED Provider Notes (Addendum)
[facility] Emergency Department Provider Note ____________________________________________   First MD Initiated Contact with Patient 01/05/18 1503     (approximate)  I have reviewed the triage vital signs and the nursing notes.   HISTORY  Chief Complaint Abdominal Pain  HPI Renee Mack is a 52 y.o. female with a history of vulvar cancer, currently not under treatment, who was presented emergency department with 24 hours of lower abdominal pain.  Says the pain is a crampy and sharp pain that radiates through to her left flank.  She says that she has worsening pain with urination.  Says that she thinks this may be related to a urinary tract infection as it feels similar to symptoms she has had in the past with UTI.  Patient also says that she has vomiting.  Recent history of just over the past several weeks of needing multiple stents for MI and then stent occlusion MI.  Does not report any chest pain or shortness of breath at this time.  No diarrhea.  Past Medical History:  Diagnosis Date  . Cancer ([place])    vulvular  . Chronic combined systolic (congestive) and diastolic (congestive) heart failure ([place])    a. 12/2017 Echo: EF 30-35%, mid-apicalanteroseptal, ant, and apical AK. Gr1 DD. Mild conc LVH.  Marland Kitchen COPD (chronic obstructive pulmonary disease) (HCC)    not on home oxygen  . Coronary artery disease    a. 12/2017 ACS/PCI: LM nl, LAD 100p (2.25x26 Resolute Onyx DES), 88m (2.0x12 Resolute Onyx DES), 80d (2.0x15 Resolute Onyx DES), LCX 30p, RCA 90p (non-dominant). EF 25-35%. Post-MI course complicated by cardiogenic shock req vasopressor Rx.  . Diabetes mellitus without complication ([place])   . GERD (gastroesophageal reflux disease)   . HLD (hyperlipidemia)   . Ischemic cardiomyopathy    a. 12/2017 Echo: EF 30-35%.  . Myocardial infarction ([place])    a. 12/2017-->DES to LAD x 3.  . Pancreatitis   . Shingles   . Tobacco abuse     Patient Active Problem List   Diagnosis Date Noted  . Non-ST elevation (NSTEMI) myocardial infarction ([place])   . Chronic systolic heart failure ([place])   . Ischemic chest pain 12/26/2017  . Chest pain 12/26/2017  . Unstable angina ([place])   . Acute ST elevation myocardial infarction (STEMI) of anterior wall ([place])   . Ischemic cardiomyopathy   . Acute systolic heart failure ([place])   . Cardiogenic shock ([place])   . Tobacco abuse   . Acute ST elevation myocardial infarction (STEMI) involving left anterior descending (LAD) coronary artery ([place]) 12/19/2017  . Protein-calorie malnutrition, severe 07/18/2017  . DKA (diabetic ketoacidoses) ([place]) 07/17/2017  . Acute ischemic enteritis ([place]) 06/12/2017  . Pyelonephritis 12/28/2014  . Type 2 diabetes mellitus ([place]) 12/28/2014  . COPD (chronic obstructive pulmonary disease) ([place]) 12/28/2014  . GERD (gastroesophageal reflux disease) 12/28/2014  . History of shingles 12/28/2014  . Hyperlipidemia LDL goal <70 12/28/2014    Past Surgical History:  Procedure Laterality Date  . ABDOMINAL HYSTERECTOMY     partial  . CORONARY STENT INTERVENTION N/A 12/26/2017   Procedure: CORONARY STENT INTERVENTION;  Surgeon: Wellington Hampshire, MD;  Location: [place] [facility];  Service: Cardiovascular;  Laterality: N/A;  . CORONARY/GRAFT ACUTE MI REVASCULARIZATION N/A 12/19/2017   Procedure: Coronary/Graft Acute MI Revascularization;  Surgeon: Wellington Hampshire, MD;  Location: [place] [facility];  Service: Cardiovascular;  Laterality: N/A;  . ECTOPIC PREGNANCY SURGERY Left   . LEFT HEART CATH AND CORONARY ANGIOGRAPHY  N/A 12/19/2017   Procedure: LEFT HEART CATH AND CORONARY ANGIOGRAPHY;  Surgeon: Wellington Hampshire, MD;  Location: [place] [facility];  Service: Cardiovascular;  Laterality: N/A;  . LEFT HEART CATH AND CORONARY ANGIOGRAPHY N/A 12/26/2017   Procedure: LEFT HEART CATH AND CORONARY ANGIOGRAPHY;  Surgeon: Wellington Hampshire, MD;  Location: [place] [facility];  Service: Cardiovascular;   Laterality: N/A;  . LYMPHADENECTOMY    . SPLENECTOMY, PARTIAL    . vulvulasectomy      Prior to Admission medications   Medication Sig Start Date End Date Taking? Authorizing Provider  acetaminophen (TYLENOL) 325 MG tablet Take 650 mg by mouth every 6 (six) hours as needed for mild pain.     [provider]  albuterol (PROAIR HFA) 108 (90 Base) MCG/ACT inhaler Inhale 1 puff into the lungs every 6 (six) hours as needed for wheezing or shortness of breath.     [provider]  aspirin 81 MG tablet Take 81 mg by mouth daily.    [provider]  atorvastatin (LIPITOR) 80 MG tablet Take 1 tablet (80 mg total) by mouth daily at 6 PM. 12/21/17 02/19/18  Henreitta Leber, MD  citalopram (CELEXA) 20 MG tablet Take 20 mg daily by mouth.    [provider]  digoxin (LANOXIN) 0.125 MG tablet Take 1 tablet (0.125 mg total) by mouth daily. 12/30/17   Loletha Grayer, MD  insulin aspart (NOVOLOG) 100 UNIT/ML injection Inject 10 Units into the skin 3 (three) times daily before meals. Take 10 units PLUS sliding scale    [provider]  insulin glargine (LANTUS) 100 UNIT/ML injection Inject 60 Units into the skin at bedtime.    [provider]  LORazepam (ATIVAN) 1 MG tablet Take 1 mg by mouth every 8 (eight) hours.    [provider]  midodrine (PROAMATINE) 2.5 MG tablet Take 1 tablet (2.5 mg total) by mouth 3 (three) times daily with meals. 12/30/17   Loletha Grayer, MD  nicotine (NICODERM CQ - DOSED IN MG/24 HOURS) 14 mg/24hr patch Place 1 patch (14 mg total) onto the skin daily. Okay to substitute generic patch 12/30/17 12/30/18  Loletha Grayer, MD  oxyCODONE-acetaminophen (PERCOCET/ROXICET) 5-325 MG tablet Take 1 tablet by mouth every 6 (six) hours as needed for moderate pain. 12/30/17   Loletha Grayer, MD  ticagrelor (BRILINTA) 90 MG TABS tablet Take 1 tablet (90 mg total) by mouth 2 (two) times daily. 12/21/17 02/19/18  Henreitta Leber, MD     Allergies Bee venom; Metformin and related; Darvon [propoxyphene]; Gabapentin; Nsaids; and Tramadol  Family History  Problem Relation Age of Onset  . Cancer Mother   . Osteoporosis Sister   . Heart disease Brother   . Heart attack Father     Social History Social History   Tobacco Use  . Smoking status: Former Smoker    Packs/day: 0.50    Years: 30.00    Pack years: 15.00    Types: Cigarettes  . Smokeless tobacco: Never Used  . Tobacco comment: quit 12/19/2017.  Substance Use Topics  . Alcohol use: No  . Drug use: No    Review of Systems  Constitutional: No fever/chills Eyes: No visual changes. ENT: No sore throat. Cardiovascular: Denies chest pain. Respiratory: Denies shortness of breath. Gastrointestinal:   No diarrhea.  No constipation. Genitourinary: As above Musculoskeletal: Negative for back pain. Skin: Negative for rash. Neurological: Negative for headaches, focal weakness or numbness.   ____________________________________________   PHYSICAL EXAM:  VITAL SIGNS: ED Triage Vitals [01/05/18 1305]  Enc Vitals Group     BP 116/69     Pulse Rate 86     Resp 16     Temp 98.5 F (36.9 C)     Temp Source Oral     SpO2 98 %     Weight 130 lb (59 kg)     Height 5\' 1"  (1.549 m)     Head Circumference      Peak Flow      Pain Score 8     Pain Loc      Pain Edu?      Excl. in [place]?     Constitutional: Alert and oriented. Well appearing and in no acute distress. Eyes: Conjunctivae are normal.  Head: Atraumatic. Nose: No congestion/rhinnorhea. Mouth/Throat: Mucous membranes are moist.  Neck: No stridor.   Cardiovascular: Normal rate, regular rhythm. Grossly normal heart sounds.   Respiratory: Normal respiratory effort.  No retractions. Lungs CTAB. Gastrointestinal: Soft with moderate tenderness across lower abdomen without any rebound or guarding.  No distention.  Mild left-sided CVA tenderness to palpation. Genitourinary: External examination with  what appears to be mildly ulcerated mucosa to the internal labia minora, bilaterally.  No discharge noted.  No bleeding noted.  No obvious masses noted. Musculoskeletal: No lower extremity tenderness nor edema.  No joint effusions. Neurologic:  Normal speech and language. No gross focal neurologic deficits are appreciated. Skin:  Skin is warm, dry and intact. No rash noted. Psychiatric: Mood and affect are normal. Speech and behavior are normal.  ____________________________________________   LABS (all labs ordered are listed, but only abnormal results are displayed)  Labs Reviewed  COMPREHENSIVE METABOLIC PANEL - Abnormal; Notable for the following components:      Result Value   Glucose, Bld 371 (*)    Total Protein 8.6 (*)    Alkaline Phosphatase 140 (*)    All other components within normal limits  URINALYSIS, COMPLETE (UACMP) WITH MICROSCOPIC - Abnormal; Notable for the following components:   Color, Urine STRAW (*)    APPearance CLEAR (*)    Glucose, UA >=500 (*)    Hgb urine dipstick SMALL (*)    Leukocytes, UA SMALL (*)    All other components within normal limits  URINE CULTURE  LIPASE, BLOOD  CBC  POC URINE PREG, ED   ____________________________________________  EKG  ED ECG REPORT I, Doran Stabler, the attending physician, personally viewed and interpreted this ECG.   Date: 01/05/2018  EKG Time: 1335  Rate: 87  Rhythm: normal sinus rhythm  Axis: Normal  Intervals:none  ST&T Change: No ST segment elevation or depression.  T wave inversions in 1, aVL as well as V2. Changes in V2 appear new. ____________________________________________  RADIOLOGY  No bowel obstruction on the CT.  No evidence of diverticulitis.  Focal herniation of the transverse colon in the midline anterior abdomen without evidence of incarceration.  No other acute findings.  Transverse colon herniation is chronic and appears  unchanged. ____________________________________________   PROCEDURES  Procedure(s) performed:   Procedures  Critical Care performed:   ____________________________________________   INITIAL IMPRESSION / ASSESSMENT AND PLAN / ED COURSE  Pertinent labs & imaging results that were available during my care of the patient were reviewed by me and considered in my medical decision making (see chart for details).  Differential diagnosis includes, but is not limited to, ovarian cyst, ovarian torsion, acute appendicitis, diverticulitis, urinary tract infection/pyelonephritis, endometriosis, bowel obstruction, colitis, renal  colic, gastroenteritis, hernia, fibroids, endometriosis, pregnancy related pain including ectopic pregnancy, etc. As part of my medical decision making, I reviewed the following data within the [place] chart reviewed and Notes from prior ED visits   ----------------------------------------- 4:52 PM on 01/05/2018 -----------------------------------------  Patient has not vomited in the emergency department and is actually eating food that was brought by a friend.  Possible urinary tract infection.  Will send urine culture and give Keflex for home use.  Patient will follow-up as an outpatient.  She is requesting Zofran.  Says that she has Bentyl at home.  I believe this will be sufficient for pain.  She is understanding of the diagnosis as well as the treatment plan willing to comply. ____________________________________________   FINAL CLINICAL IMPRESSION(S) / ED DIAGNOSES  Pyelonephritis.   NEW MEDICATIONS STARTED DURING THIS VISIT:  New Prescriptions   No medications on file     Note:  This document was prepared using Dragon voice recognition software and may include unintentional dictation errors.     Orbie Pyo, MD 01/05/18 1653    Schaevitz, Randall An, MD 01/12/18 [phone]

## 2018-01-08 LAB — URINE CULTURE

## 2018-01-10 ENCOUNTER — Ambulatory Visit (INDEPENDENT_AMBULATORY_CARE_PROVIDER_SITE_OTHER): Payer: Medicaid Other | Admitting: Nurse Practitioner

## 2018-01-10 ENCOUNTER — Encounter: Payer: Self-pay | Admitting: Nurse Practitioner

## 2018-01-10 VITALS — BP 100/62 | HR 75 | Ht 61.0 in | Wt 133.5 lb

## 2018-01-10 DIAGNOSIS — I255 Ischemic cardiomyopathy: Secondary | ICD-10-CM | POA: Diagnosis not present

## 2018-01-10 DIAGNOSIS — I5022 Chronic systolic (congestive) heart failure: Secondary | ICD-10-CM | POA: Diagnosis not present

## 2018-01-10 DIAGNOSIS — I25119 Atherosclerotic heart disease of native coronary artery with unspecified angina pectoris: Secondary | ICD-10-CM | POA: Diagnosis not present

## 2018-01-10 DIAGNOSIS — I214 Non-ST elevation (NSTEMI) myocardial infarction: Secondary | ICD-10-CM | POA: Diagnosis not present

## 2018-01-10 MED ORDER — DIGOXIN 125 MCG PO TABS: 0.1250 mg | ORAL_TABLET | Freq: Every day | ORAL | 3 refills | Status: DC

## 2018-01-10 MED ORDER — TICAGRELOR 90 MG PO TABS: 90.0000 mg | ORAL_TABLET | Freq: Two times a day (BID) | ORAL | 3 refills | Status: AC

## 2018-01-10 MED ORDER — ATORVASTATIN CALCIUM 80 MG PO TABS: 80.0000 mg | ORAL_TABLET | Freq: Every day | ORAL | 3 refills | Status: DC

## 2018-01-10 NOTE — Progress Notes (Addendum)
Office Visit    Patient Name: Renee Mack Date of Encounter: 01/10/2018  Primary Care Provider:  Janalyn Shy, MD Primary Cardiologist:  [name], MD  Chief Complaint    52 year old female with history of CAD status post recent MI and drug-eluting stent placement to the LAD x3 with subsequent readmission for subacute stent thrombosis, combined CHF/ischemic cardiomyopathy with an EF of 30 to 35%, tobacco abuse, COPD, hyperlipidemia, GERD, type of diabetes mellitus, and pancreatitis, who presents for hospital follow-up.  Past Medical History    Past Medical History:  Diagnosis Date  . Cancer ([place])    vulvular  . Chronic combined systolic (congestive) and diastolic (congestive) heart failure ([place])    a. 12/2017 Echo: EF 30-35%, mid-apicalanteroseptal, ant, and apical AK. Gr1 DD. Mild conc LVH.  Marland Kitchen COPD (chronic obstructive pulmonary disease) (HCC)    not on home oxygen  . Coronary artery disease    a. 12/2017 ACS/PCI: LM nl, LAD 100p (2.25x26 Resolute Onyx DES), 78m (2.0x12 Resolute Onyx DES), 80d (2.0x15 Resolute Onyx DES), LCX 30p, RCA 90p (non-dominant). EF 25-35%. Post-MI course complicated by cardiogenic shock req vasopressor Rx; b. 12/2017 NSTEMI/subacute thrombosis-->LAD 100 (PTCA + DES x 1).  . Diabetes mellitus without complication ([place])   . GERD (gastroesophageal reflux disease)   . HLD (hyperlipidemia)   . Ischemic cardiomyopathy    a. 12/2017 Echo: EF 30-35%.  . Myocardial infarction ([place])    a. 12/2017-->DES to LAD x 3.  . Pancreatitis   . Shingles   . Tobacco abuse    Past Surgical History:  Procedure Laterality Date  . ABDOMINAL HYSTERECTOMY     partial  . CORONARY STENT INTERVENTION N/A 12/26/2017   Procedure: CORONARY STENT INTERVENTION;  Surgeon: Wellington Hampshire, MD;  Location: [place] [facility];  Service: Cardiovascular;  Laterality: N/A;  . CORONARY/GRAFT ACUTE MI REVASCULARIZATION N/A 12/19/2017   Procedure: Coronary/Graft Acute MI Revascularization;   Surgeon: Wellington Hampshire, MD;  Location: [place] [facility];  Service: Cardiovascular;  Laterality: N/A;  . ECTOPIC PREGNANCY SURGERY Left   . LEFT HEART CATH AND CORONARY ANGIOGRAPHY N/A 12/19/2017   Procedure: LEFT HEART CATH AND CORONARY ANGIOGRAPHY;  Surgeon: Wellington Hampshire, MD;  Location: [place] [facility];  Service: Cardiovascular;  Laterality: N/A;  . LEFT HEART CATH AND CORONARY ANGIOGRAPHY N/A 12/26/2017   Procedure: LEFT HEART CATH AND CORONARY ANGIOGRAPHY;  Surgeon: Wellington Hampshire, MD;  Location: [place] [facility];  Service: Cardiovascular;  Laterality: N/A;  . LYMPHADENECTOMY    . SPLENECTOMY, PARTIAL    . vulvulasectomy      Allergies  Allergies  Allergen Reactions  . Bee Venom Itching, Shortness Of Breath and Swelling  . Metformin And Related Shortness Of Breath  . Darvon [Propoxyphene] Itching  . Gabapentin Swelling  . Nsaids Other (See Comments)    Ulcers   . Tramadol Hives    History of Present Illness    52 year old female with a history of CAD status post recent MI with drug-eluting stent placement to the LAD x3, combined CHF/ischemic cardiomyopathy with an EF of 30 to 35%, tobacco abuse, COPD, hyperlipidemia, GERD, type 2 diabetes mellitus, and pancreatitis.  She was recently admitted to [facility] with a several day history of chest pain radiating to her neck and arm.  She was found to have elevated troponins and subsequently underwent catheterization revealing severe proximal, mid, and distal LAD disease along with severe disease and a non-dominant right coronary artery.  EF was 25 to 35%.  She underwent stenting x3 to the LAD.  Post cath course was complicated by cardiogenic shock and hypotension requiring vasopressors.  Echo showed EF of 30 to 35%.  She was discharged home on July 3 with a LifeVest in place.  She return to the emergency department on July 8 with recurrent chest pain radiating to her neck, back, and down her arms.  She also had  nausea and vomiting with dyspnea.  She underwent repeat catheterization revealing a total occlusion of the proximal to mid LAD with large thrombus burden.  The LAD was successfully treated with PTCA and also a drug-eluting stent to the mid LAD.  Blood pressure remained very soft postprocedure which limited medication titration.  She was subsequently discharged home on July 12.  Since her discharge, she has had a few episodes of left upper chest/neck, and shoulder discomfort occurring exclusively when laying in bed at night, typically lasting only a few minutes, and resolving spontaneously.  She has not had any daytime symptoms or symptoms with activity.  She did have an episode of this discomfort last night that lasted a little bit longer than usual and did wake her up from sleep.  She is had no recurrence since awakening and taking a Nexium.  She has quit smoking and has been compliant with her medications.  She is looking at her diet and trying to improve it.  She denies PND, orthopnea, dizziness, syncope, edema, early satiety, or dyspnea.  Home Medications    Prior to Admission medications   Medication Sig Start Date End Date Taking? Authorizing Provider  acetaminophen (TYLENOL) 325 MG tablet Take 650 mg by mouth every 6 (six) hours as needed for mild pain.    Yes [provider]  albuterol (PROAIR HFA) 108 (90 Base) MCG/ACT inhaler Inhale 1 puff into the lungs every 6 (six) hours as needed for wheezing or shortness of breath.    Yes [provider]  aspirin 81 MG tablet Take 81 mg by mouth daily.   Yes [provider]  atorvastatin (LIPITOR) 80 MG tablet Take 1 tablet (80 mg total) by mouth daily at 6 PM. 12/21/17 02/19/18 Yes Sainani, Belia Heman, MD  cephALEXin (KEFLEX) 500 MG capsule Take 1 capsule (500 mg total) by mouth 3 (three) times daily for 10 days. 01/05/18 01/15/18 Yes Schaevitz, Randall An, MD  citalopram (CELEXA) 20 MG tablet Take 20 mg daily by mouth.   Yes  [provider]  digoxin (LANOXIN) 0.125 MG tablet Take 1 tablet (0.125 mg total) by mouth daily. 12/30/17  Yes Wieting, Richard, MD  insulin aspart (NOVOLOG) 100 UNIT/ML injection Inject 10 Units into the skin 3 (three) times daily before meals. Take 10 units PLUS sliding scale   Yes [provider]  insulin glargine (LANTUS) 100 UNIT/ML injection Inject 60 Units into the skin at bedtime.   Yes [provider]  LORazepam (ATIVAN) 1 MG tablet Take 1 mg by mouth every 8 (eight) hours.   Yes [provider]  midodrine (PROAMATINE) 2.5 MG tablet Take 1 tablet (2.5 mg total) by mouth 3 (three) times daily with meals. 12/30/17  Yes Wieting, Richard, MD  nicotine (NICODERM CQ - DOSED IN MG/24 HOURS) 14 mg/24hr patch Place 1 patch (14 mg total) onto the skin daily. Okay to substitute generic patch 12/30/17 12/30/18 Yes Wieting, Richard, MD  ondansetron (ZOFRAN) 4 MG tablet Take 1 tablet (4 mg total) by mouth daily as needed. 01/05/18  Yes  Orbie Pyo, MD  oxyCODONE-acetaminophen (PERCOCET/ROXICET) 5-325 MG tablet Take 1 tablet by mouth every 6 (six) hours as needed for moderate pain. 12/30/17  Yes Wieting, Richard, MD  ticagrelor (BRILINTA) 90 MG TABS tablet Take 1 tablet (90 mg total) by mouth 2 (two) times daily. 12/21/17 02/19/18 Yes Henreitta Leber, MD    Review of Systems    Intermittent left upper chest/neck/left shoulder discomfort occurring exclusively when she is in bed at night.  No chest pain during the day.  Denies dyspnea, PND, orthopnea, dizziness, syncope, edema, or early satiety.  All other systems reviewed and are otherwise negative except as noted above.  Physical Exam    VS:  BP 100/62 (BP Location: Left Arm, Patient Position: Sitting, Cuff Size: Normal)   Pulse 75   Ht 5\' 1"  (1.549 m)   Wt 133 lb 8 oz (60.6 kg)   BMI 25.22 kg/m  , BMI Body mass index is 25.22 kg/m. GEN: Well nourished, well developed, in no acute distress.  HEENT: normal.   Neck: Supple, no JVD, carotid bruits, or masses. Cardiac: RRR, no murmurs, rubs, or gallops. No clubbing, cyanosis, edema.  Radials/DP/PT 2+ and equal bilaterally.  Respiratory:  Respirations regular and unlabored, clear to auscultation bilaterally. GI: Soft, nontender, nondistended, BS + x 4. MS: no deformity or atrophy. Skin: warm and dry, no rash. Neuro:  Strength and sensation are intact. Psych: Normal affect.  Accessory Clinical Findings    ECG -regular sinus rhythm, 75, left axis deviation, prior anteroseptal infarct with anterolateral T wave inversion  Assessment & Plan    1.  Non-STEMI, subsequent episode of care/CAD: Status post previous anterior STEMI with LAD stenting x3 followed by non-STEMI in the setting of subacute stent thrombosis requiring stenting and PTCA of the LAD.  Medical therapy limited by relative hypotension.  Since the most recent discharge, she has had a few episodes of left upper chest/neck/shoulder discomfort without associated symptoms, occurring exclusively when lying in bed at night.  Symptoms lasted a little longer last night then on prior occasions.  She did not take a nitroglycerin but did take Nexium and then symptoms resolved.  She has not had any daytime symptoms.  It is suspected that her anterior wall is completely infarcted and that she may be at high risk for restenosis of the LAD as result.  She also has moderate to severe disease and a small right coronary artery which is supplying collaterals to the anterior wall.  I discussed her case with Dr. Fletcher Anon today.  Continue medical therapy for the time being and patient is aware that she may take a supple nitroglycerin if she has recurrent symptoms.  If she has progression of symptoms, she is to either contact us or 911.  Is only at that point that we would consider PCI of the RCA.  Repeat PCI of the LAD is likely to be futile.  She remains on aspirin, statin, and Brilinta therapy.  Blood pressure prevents [place]  from adding a beta-blocker or ARB.  2.  Ischemic cardiomyopathy/HFrEF: Euvolemic on exam.  She has not been having any significant dyspnea.  She is not on beta-blocker/ACE/ARB/ARN I/MRA secondary to hypotension.  She is on digoxin.  LifeVest in place.  Plan to follow-up echo in approximately 3 months.  3.  Tobacco abuse: She quit smoking following most recent discharge.  I congratulated her on this and encouraged her to remain off of cigarettes.  4.  Relative hypotension: Stable.  5.  DM  II:  On lantus.  Per IM.  6.  Disposition: Plan to see her back in approximate 2 to 3 weeks.  Murray Hodgkins, NP 01/10/2018, 3:10 PM

## 2018-01-10 NOTE — Patient Instructions (Signed)
Medication Instructions: - Your physician recommends that you continue on your current medications as directed. Please refer to the Current Medication list given to you today.  Labwork: - none ordered  Procedures/Testing: - none ordered  Follow-Up: - Your physician recommends that you schedule a follow-up appointment in: 2-3 weeks with Ignacia Bayley, NP  Thursday 02/02/18 at 1:30 pm   Any Additional Special Instructions Will Be Listed Below (If Applicable).     If you need a refill on your cardiac medications before your next appointment, please call your pharmacy.

## 2018-01-15 ENCOUNTER — Other Ambulatory Visit: Payer: Self-pay

## 2018-01-15 ENCOUNTER — Emergency Department: Payer: Medicaid Other

## 2018-01-15 ENCOUNTER — Inpatient Hospital Stay
Admission: EM | Admit: 2018-01-15 | Discharge: 2018-01-17 | DRG: 552 | Disposition: A | Discharge status: Home / Self Care | Payer: Medicaid Other | Attending: Internal Medicine | Admitting: Internal Medicine

## 2018-01-15 DIAGNOSIS — Z87891 Personal history of nicotine dependence: Secondary | ICD-10-CM | POA: Diagnosis not present

## 2018-01-15 DIAGNOSIS — F17200 Nicotine dependence, unspecified, uncomplicated: Secondary | ICD-10-CM | POA: Diagnosis not present

## 2018-01-15 DIAGNOSIS — I200 Unstable angina: Secondary | ICD-10-CM

## 2018-01-15 DIAGNOSIS — Z888 Allergy status to other drugs, medicaments and biological substances status: Secondary | ICD-10-CM

## 2018-01-15 DIAGNOSIS — Z794 Long term (current) use of insulin: Secondary | ICD-10-CM

## 2018-01-15 DIAGNOSIS — R402360 Coma scale, best motor response, obeys commands, unspecified time: Secondary | ICD-10-CM | POA: Diagnosis present

## 2018-01-15 DIAGNOSIS — I2510 Atherosclerotic heart disease of native coronary artery without angina pectoris: Secondary | ICD-10-CM | POA: Diagnosis present

## 2018-01-15 DIAGNOSIS — Z91030 Bee allergy status: Secondary | ICD-10-CM

## 2018-01-15 DIAGNOSIS — I959 Hypotension, unspecified: Secondary | ICD-10-CM | POA: Diagnosis present

## 2018-01-15 DIAGNOSIS — I255 Ischemic cardiomyopathy: Secondary | ICD-10-CM | POA: Diagnosis present

## 2018-01-15 DIAGNOSIS — Z7902 Long term (current) use of antithrombotics/antiplatelets: Secondary | ICD-10-CM | POA: Diagnosis not present

## 2018-01-15 DIAGNOSIS — Z79899 Other long term (current) drug therapy: Secondary | ICD-10-CM | POA: Diagnosis not present

## 2018-01-15 DIAGNOSIS — J449 Chronic obstructive pulmonary disease, unspecified: Secondary | ICD-10-CM | POA: Diagnosis present

## 2018-01-15 DIAGNOSIS — R402250 Coma scale, best verbal response, oriented, unspecified time: Secondary | ICD-10-CM | POA: Diagnosis present

## 2018-01-15 DIAGNOSIS — I252 Old myocardial infarction: Secondary | ICD-10-CM | POA: Diagnosis not present

## 2018-01-15 DIAGNOSIS — Z7982 Long term (current) use of aspirin: Secondary | ICD-10-CM | POA: Diagnosis not present

## 2018-01-15 DIAGNOSIS — I5042 Chronic combined systolic (congestive) and diastolic (congestive) heart failure: Secondary | ICD-10-CM | POA: Diagnosis present

## 2018-01-15 DIAGNOSIS — M542 Cervicalgia: Principal | ICD-10-CM | POA: Diagnosis present

## 2018-01-15 DIAGNOSIS — Z955 Presence of coronary angioplasty implant and graft: Secondary | ICD-10-CM | POA: Diagnosis not present

## 2018-01-15 DIAGNOSIS — Z8619 Personal history of other infectious and parasitic diseases: Secondary | ICD-10-CM | POA: Diagnosis not present

## 2018-01-15 DIAGNOSIS — I5022 Chronic systolic (congestive) heart failure: Secondary | ICD-10-CM | POA: Diagnosis not present

## 2018-01-15 DIAGNOSIS — R0789 Other chest pain: Secondary | ICD-10-CM | POA: Diagnosis not present

## 2018-01-15 DIAGNOSIS — E785 Hyperlipidemia, unspecified: Secondary | ICD-10-CM | POA: Diagnosis present

## 2018-01-15 DIAGNOSIS — R402140 Coma scale, eyes open, spontaneous, unspecified time: Secondary | ICD-10-CM | POA: Diagnosis present

## 2018-01-15 DIAGNOSIS — Z885 Allergy status to narcotic agent status: Secondary | ICD-10-CM

## 2018-01-15 DIAGNOSIS — Z886 Allergy status to analgesic agent status: Secondary | ICD-10-CM

## 2018-01-15 DIAGNOSIS — M546 Pain in thoracic spine: Secondary | ICD-10-CM | POA: Diagnosis not present

## 2018-01-15 DIAGNOSIS — E118 Type 2 diabetes mellitus with unspecified complications: Secondary | ICD-10-CM | POA: Diagnosis not present

## 2018-01-15 DIAGNOSIS — E1165 Type 2 diabetes mellitus with hyperglycemia: Secondary | ICD-10-CM | POA: Diagnosis present

## 2018-01-15 DIAGNOSIS — I25118 Atherosclerotic heart disease of native coronary artery with other forms of angina pectoris: Secondary | ICD-10-CM | POA: Diagnosis not present

## 2018-01-15 DIAGNOSIS — I110 Hypertensive heart disease with heart failure: Secondary | ICD-10-CM | POA: Diagnosis present

## 2018-01-15 DIAGNOSIS — M25512 Pain in left shoulder: Secondary | ICD-10-CM | POA: Diagnosis present

## 2018-01-15 LAB — GLUCOSE, CAPILLARY
GLUCOSE-CAPILLARY: 409 mg/dL — AB (ref 70–99)
Glucose-Capillary: 252 mg/dL — ABNORMAL HIGH (ref 70–99)

## 2018-01-15 LAB — CBC
HCT: 40.4 % (ref 35.0–47.0)
Hemoglobin: 13.8 g/dL (ref 12.0–16.0)
MCH: 32.2 pg (ref 26.0–34.0)
MCHC: 34.2 g/dL (ref 32.0–36.0)
MCV: 94 fL (ref 80.0–100.0)
Platelets: 237 10*3/uL (ref 150–440)
RBC: 4.3 MIL/uL (ref 3.80–5.20)
RDW: 14.1 % (ref 11.5–14.5)
WBC: 9 10*3/uL (ref 3.6–11.0)

## 2018-01-15 LAB — BASIC METABOLIC PANEL
Anion gap: 7 (ref 5–15)
BUN: 17 mg/dL (ref 6–20)
CHLORIDE: 103 mmol/L (ref 98–111)
CO2: 25 mmol/L (ref 22–32)
CREATININE: 0.56 mg/dL (ref 0.44–1.00)
Calcium: 8.9 mg/dL (ref 8.9–10.3)
GFR calc Af Amer: >60 mL/min (ref >60)
GFR calc non Af Amer: >60 mL/min (ref >60)
Glucose, Bld: 391 mg/dL — ABNORMAL HIGH (ref 70–99)
Potassium: 4.1 mmol/L (ref 3.5–5.1)
Sodium: 135 mmol/L (ref 135–145)

## 2018-01-15 LAB — PROTIME-INR
INR: 0.94
Prothrombin Time: 12.5 seconds (ref 11.4–15.2)

## 2018-01-15 LAB — HEPARIN LEVEL (UNFRACTIONATED): Heparin Unfractionated: 0.13 IU/mL — ABNORMAL LOW (ref 0.30–0.70)

## 2018-01-15 LAB — TROPONIN I
Troponin I: <0.03 ng/mL (ref <0.03)
Troponin I: <0.03 ng/mL (ref <0.03)
Troponin I: <0.03 ng/mL (ref <0.03)

## 2018-01-15 LAB — APTT: aPTT: 27 seconds (ref 24–36)

## 2018-01-15 MED ORDER — FENTANYL CITRATE (PF) 100 MCG/2ML IJ SOLN
25.0000 ug | Freq: Once | INTRAMUSCULAR | Status: AC
  Administered 2018-01-15: 25 ug via INTRAVENOUS
  Filled 2018-01-15: qty 2

## 2018-01-15 MED ORDER — TICAGRELOR 90 MG PO TABS
90.0000 mg | ORAL_TABLET | Freq: Two times a day (BID) | ORAL | Status: DC
  Administered 2018-01-15 – 2018-01-17 (×4): 90 mg via ORAL
  Filled 2018-01-15 (×4): qty 1

## 2018-01-15 MED ORDER — HEPARIN (PORCINE) IN NACL 100-0.45 UNIT/ML-% IJ SOLN
1000.0000 [IU]/h | INTRAMUSCULAR | Status: DC
  Administered 2018-01-15: 700 [IU]/h via INTRAVENOUS
  Administered 2018-01-16: 1000 [IU]/h via INTRAVENOUS
  Filled 2018-01-15 (×2): qty 250

## 2018-01-15 MED ORDER — INSULIN ASPART 100 UNIT/ML ~~LOC~~ SOLN
0.0000 [IU] | Freq: Every day | SUBCUTANEOUS | Status: DC
  Administered 2018-01-15: 3 [IU] via SUBCUTANEOUS
  Filled 2018-01-15: qty 1

## 2018-01-15 MED ORDER — ZOLPIDEM TARTRATE 5 MG PO TABS
5.0000 mg | ORAL_TABLET | Freq: Every evening | ORAL | Status: DC | PRN
  Administered 2018-01-16: 5 mg via ORAL
  Filled 2018-01-15: qty 1

## 2018-01-15 MED ORDER — NITROGLYCERIN 2 % TD OINT
0.5000 [in_us] | TOPICAL_OINTMENT | Freq: Once | TRANSDERMAL | Status: AC
  Administered 2018-01-15: 0.5 [in_us] via TOPICAL

## 2018-01-15 MED ORDER — NITROGLYCERIN 0.4 MG SL SUBL
0.4000 mg | SUBLINGUAL_TABLET | SUBLINGUAL | Status: DC | PRN
  Administered 2018-01-15 – 2018-01-16 (×4): 0.4 mg via SUBLINGUAL
  Filled 2018-01-15 (×2): qty 1

## 2018-01-15 MED ORDER — NICOTINE 14 MG/24HR TD PT24
14.0000 mg | MEDICATED_PATCH | TRANSDERMAL | Status: DC
  Filled 2018-01-15: qty 1

## 2018-01-15 MED ORDER — NITROGLYCERIN 0.4 MG SL SUBL
SUBLINGUAL_TABLET | SUBLINGUAL | Status: AC
  Filled 2018-01-15: qty 3

## 2018-01-15 MED ORDER — LORAZEPAM 1 MG PO TABS
1.0000 mg | ORAL_TABLET | Freq: Three times a day (TID) | ORAL | Status: DC
  Administered 2018-01-15 – 2018-01-17 (×6): 1 mg via ORAL
  Filled 2018-01-15 (×6): qty 1

## 2018-01-15 MED ORDER — NITROGLYCERIN 2 % TD OINT
TOPICAL_OINTMENT | TRANSDERMAL | Status: AC
  Administered 2018-01-15: 0.5 [in_us] via TOPICAL
  Filled 2018-01-15: qty 1

## 2018-01-15 MED ORDER — INSULIN ASPART 100 UNIT/ML ~~LOC~~ SOLN
0.0000 [IU] | Freq: Three times a day (TID) | SUBCUTANEOUS | Status: DC
  Administered 2018-01-15: 9 [IU] via SUBCUTANEOUS
  Administered 2018-01-16: 2 [IU] via SUBCUTANEOUS
  Administered 2018-01-16: 7 [IU] via SUBCUTANEOUS
  Administered 2018-01-17: 1 [IU] via SUBCUTANEOUS
  Administered 2018-01-17: 2 [IU] via SUBCUTANEOUS
  Filled 2018-01-15 (×5): qty 1

## 2018-01-15 MED ORDER — ONDANSETRON HCL 4 MG/2ML IJ SOLN
4.0000 mg | Freq: Four times a day (QID) | INTRAMUSCULAR | Status: DC | PRN
  Administered 2018-01-15 – 2018-01-16 (×3): 4 mg via INTRAVENOUS
  Filled 2018-01-15 (×3): qty 2

## 2018-01-15 MED ORDER — MORPHINE SULFATE (PF) 4 MG/ML IV SOLN
INTRAVENOUS | Status: AC
  Filled 2018-01-15: qty 1

## 2018-01-15 MED ORDER — ACETAMINOPHEN 325 MG PO TABS: 650.0000 mg | ORAL_TABLET | ORAL | Status: DC | PRN

## 2018-01-15 MED ORDER — HEPARIN BOLUS VIA INFUSION
1800.0000 [IU] | Freq: Once | INTRAVENOUS | Status: AC
  Administered 2018-01-15: 1800 [IU] via INTRAVENOUS
  Filled 2018-01-15: qty 1800

## 2018-01-15 MED ORDER — INSULIN ASPART 100 UNIT/ML ~~LOC~~ SOLN
10.0000 [IU] | Freq: Three times a day (TID) | SUBCUTANEOUS | Status: DC
  Administered 2018-01-15 – 2018-01-17 (×6): 10 [IU] via SUBCUTANEOUS
  Filled 2018-01-15 (×6): qty 1

## 2018-01-15 MED ORDER — MORPHINE SULFATE (PF) 4 MG/ML IV SOLN
4.0000 mg | INTRAVENOUS | Status: DC | PRN
  Administered 2018-01-15 – 2018-01-17 (×8): 4 mg via INTRAVENOUS
  Filled 2018-01-15 (×7): qty 1

## 2018-01-15 MED ORDER — INSULIN GLARGINE 100 UNIT/ML ~~LOC~~ SOLN
60.0000 [IU] | Freq: Every day | SUBCUTANEOUS | Status: DC
  Administered 2018-01-15 – 2018-01-16 (×2): 60 [IU] via SUBCUTANEOUS
  Filled 2018-01-15 (×3): qty 0.6

## 2018-01-15 MED ORDER — ATORVASTATIN CALCIUM 20 MG PO TABS
80.0000 mg | ORAL_TABLET | Freq: Every day | ORAL | Status: DC
  Administered 2018-01-15 – 2018-01-16 (×2): 80 mg via ORAL
  Filled 2018-01-15: qty 4
  Filled 2018-01-15: qty 8

## 2018-01-15 MED ORDER — CITALOPRAM HYDROBROMIDE 20 MG PO TABS
20.0000 mg | ORAL_TABLET | Freq: Every day | ORAL | Status: DC
  Administered 2018-01-15 – 2018-01-17 (×3): 20 mg via ORAL
  Filled 2018-01-15 (×3): qty 1

## 2018-01-15 MED ORDER — ASPIRIN EC 81 MG PO TBEC
81.0000 mg | DELAYED_RELEASE_TABLET | Freq: Every day | ORAL | Status: DC
  Administered 2018-01-15 – 2018-01-17 (×3): 81 mg via ORAL
  Filled 2018-01-15 (×3): qty 1

## 2018-01-15 MED ORDER — DIGOXIN 125 MCG PO TABS
0.1250 mg | ORAL_TABLET | Freq: Every day | ORAL | Status: DC
  Administered 2018-01-16 – 2018-01-17 (×2): 0.125 mg via ORAL
  Filled 2018-01-15 (×3): qty 1

## 2018-01-15 MED ORDER — OXYCODONE-ACETAMINOPHEN 5-325 MG PO TABS
1.0000 | ORAL_TABLET | Freq: Four times a day (QID) | ORAL | Status: DC | PRN
  Administered 2018-01-15 – 2018-01-17 (×6): 1 via ORAL
  Filled 2018-01-15 (×9): qty 1

## 2018-01-15 MED ORDER — ALPRAZOLAM 0.5 MG PO TABS
0.2500 mg | ORAL_TABLET | Freq: Two times a day (BID) | ORAL | Status: DC | PRN
  Administered 2018-01-16 – 2018-01-17 (×3): 0.25 mg via ORAL
  Filled 2018-01-15 (×3): qty 1

## 2018-01-15 MED ORDER — FENTANYL CITRATE (PF) 100 MCG/2ML IJ SOLN
50.0000 ug | Freq: Once | INTRAMUSCULAR | Status: AC
  Administered 2018-01-15: 50 ug via INTRAVENOUS
  Filled 2018-01-15: qty 2

## 2018-01-15 MED ORDER — MIDODRINE HCL 5 MG PO TABS
2.5000 mg | ORAL_TABLET | Freq: Three times a day (TID) | ORAL | Status: DC
  Administered 2018-01-15 – 2018-01-17 (×6): 2.5 mg via ORAL
  Filled 2018-01-15 (×6): qty 1

## 2018-01-15 MED ORDER — ALBUTEROL SULFATE (2.5 MG/3ML) 0.083% IN NEBU: 3.0000 mL | INHALATION_SOLUTION | Freq: Four times a day (QID) | RESPIRATORY_TRACT | Status: DC | PRN

## 2018-01-15 MED ORDER — HEPARIN BOLUS VIA INFUSION
3600.0000 [IU] | Freq: Once | INTRAVENOUS | Status: AC
  Administered 2018-01-15: 3600 [IU] via INTRAVENOUS
  Filled 2018-01-15: qty 3600

## 2018-01-15 NOTE — ED Notes (Signed)
X-ray at bedside

## 2018-01-15 NOTE — Plan of Care (Signed)
  Problem: Clinical Measurements: Goal: Ability to maintain clinical measurements within normal limits will improve Outcome: Progressing Goal: Diagnostic test results will improve Outcome: Progressing   

## 2018-01-15 NOTE — ED Triage Notes (Signed)
Pt arrives [facility] from home for CP that began today. extensive cardiac hx. Currently wearing external defibraillator x 1 month. Pt of Dr. Fletcher Anon. Alert, oriented. Takes blood thinners twice daily. Was given ASA and 1 nitro en route. Arrives with 20 G R AC. Had N&V&D yesterday.

## 2018-01-15 NOTE — H&P (Signed)
[facility] NAME: Renee Mack    MR#:  [id number]  DATE OF BIRTH:  March 16, 1966  DATE OF ADMISSION:  01/15/2018  PRIMARY CARE PHYSICIAN: Ro, [name], MD   REQUESTING/REFERRING PHYSICIAN: Dr. Jacqualine Code.  CHIEF COMPLAINT:   Chief Complaint  Patient presents with  . Chest Pain   Chest pain since this morning. HISTORY OF PRESENT ILLNESS:  Renee Mack  is a 52 y.o. female with a known history of CAD, CHF, COPD, hypertension, diabetes, hyperlipidemia, pancreatitis and tobacco abuse.  The patient presents the ED with chest pain is substernal area, sharp, 10/10 with radiation to the left shoulder and neck.  She also complains of nausea and diaphoresis.  She took aspirin, Brilinta and nitroglycerin at home but still has chest pain. Feels like the last time she had a heart attack and a blocked stent about 2 to 3 weeks ago. The first troponin is normal and the EKG is unremarkable.  On-call cardiologist to suggest start heparin drip for unstable angina. PAST MEDICAL HISTORY:   Past Medical History:  Diagnosis Date  . Cancer ([place])    vulvular  . Chronic combined systolic (congestive) and diastolic (congestive) heart failure ([place])    a. 12/2017 Echo: EF 30-35%, mid-apicalanteroseptal, ant, and apical AK. Gr1 DD. Mild conc LVH.  Marland Kitchen COPD (chronic obstructive pulmonary disease) (HCC)    not on home oxygen  . Coronary artery disease    a. 12/2017 ACS/PCI: LM nl, LAD 100p (2.25x26 Resolute Onyx DES), 35m (2.0x12 Resolute Onyx DES), 80d (2.0x15 Resolute Onyx DES), LCX 30p, RCA 90p (non-dominant). EF 25-35%. Post-MI course complicated by cardiogenic shock req vasopressor Rx; b. 12/2017 NSTEMI/subacute thrombosis-->LAD 100 (PTCA + DES x 1).  . Diabetes mellitus without complication ([place])   . GERD (gastroesophageal reflux disease)   . HLD (hyperlipidemia)   . Ischemic cardiomyopathy    a. 12/2017 Echo: EF 30-35%.  . Myocardial infarction ([place])    a.  12/2017-->DES to LAD x 3.  . Pancreatitis   . Shingles   . Tobacco abuse     PAST SURGICAL HISTORY:   Past Surgical History:  Procedure Laterality Date  . ABDOMINAL HYSTERECTOMY     partial  . CORONARY STENT INTERVENTION N/A 12/26/2017   Procedure: CORONARY STENT INTERVENTION;  Surgeon: Wellington Hampshire, MD;  Location: [facility];  Service: Cardiovascular;  Laterality: N/A;  . CORONARY/GRAFT ACUTE MI REVASCULARIZATION N/A 12/19/2017   Procedure: Coronary/Graft Acute MI Revascularization;  Surgeon: Wellington Hampshire, MD;  Location: [facility];  Service: Cardiovascular;  Laterality: N/A;  . ECTOPIC PREGNANCY SURGERY Left   . LEFT HEART CATH AND CORONARY ANGIOGRAPHY N/A 12/19/2017   Procedure: LEFT HEART CATH AND CORONARY ANGIOGRAPHY;  Surgeon: Wellington Hampshire, MD;  Location: [facility];  Service: Cardiovascular;  Laterality: N/A;  . LEFT HEART CATH AND CORONARY ANGIOGRAPHY N/A 12/26/2017   Procedure: LEFT HEART CATH AND CORONARY ANGIOGRAPHY;  Surgeon: Wellington Hampshire, MD;  Location: [facility];  Service: Cardiovascular;  Laterality: N/A;  . LYMPHADENECTOMY    . SPLENECTOMY, PARTIAL    . vulvulasectomy      SOCIAL HISTORY:   Social History   Tobacco Use  . Smoking status: Former Smoker    Packs/day: 0.50    Years: 30.00    Pack years: 15.00    Types: Cigarettes  . Smokeless tobacco: Never Used  . Tobacco comment: quit 12/19/2017.  Substance  Use Topics  . Alcohol use: No    FAMILY HISTORY:   Family History  Problem Relation Age of Onset  . Cancer Mother   . Osteoporosis Sister   . Heart disease Brother   . Heart attack Father     DRUG ALLERGIES:   Allergies  Allergen Reactions  . Bee Venom Itching, Shortness Of Breath and Swelling  . Metformin And Related Shortness Of Breath  . Darvon [Propoxyphene] Itching  . Gabapentin Swelling  . Nsaids Other (See Comments)    Ulcers   . Tramadol Hives    REVIEW OF SYSTEMS:   Review  of Systems  Constitutional: Positive for diaphoresis. Negative for chills, fever and malaise/fatigue.  HENT: Negative for sore throat.   Eyes: Negative for blurred vision and double vision.  Respiratory: Positive for cough and wheezing. Negative for hemoptysis, shortness of breath and stridor.   Cardiovascular: Positive for chest pain. Negative for palpitations, orthopnea and leg swelling.  Gastrointestinal: Positive for nausea. Negative for abdominal pain, blood in stool, diarrhea, melena and vomiting.  Genitourinary: Negative for dysuria, flank pain and hematuria.  Musculoskeletal: Negative for back pain and joint pain.  Skin: Negative for rash.  Neurological: Positive for dizziness and headaches. Negative for sensory change, focal weakness, seizures, loss of consciousness and weakness.  Endo/Heme/Allergies: Negative for polydipsia.  Psychiatric/Behavioral: Negative for depression. The patient is not nervous/anxious.     MEDICATIONS AT HOME:   Prior to Admission medications   Medication Sig Start Date End Date Taking? Authorizing Provider  acetaminophen (TYLENOL) 325 MG tablet Take 650 mg by mouth every 6 (six) hours as needed for mild pain.    Yes [provider]  albuterol (PROAIR HFA) 108 (90 Base) MCG/ACT inhaler Inhale 1 puff into the lungs every 6 (six) hours as needed for wheezing or shortness of breath.    Yes [provider]  aspirin 81 MG tablet Take 81 mg by mouth daily.   Yes [provider]  atorvastatin (LIPITOR) 80 MG tablet Take 1 tablet (80 mg total) by mouth daily at 6 PM. 01/10/18 03/11/18 Yes Theora Gianotti, NP  cephALEXin (KEFLEX) 500 MG capsule Take 1 capsule (500 mg total) by mouth 3 (three) times daily for 10 days. 01/05/18 01/15/18 Yes Schaevitz, Randall An, MD  citalopram (CELEXA) 20 MG tablet Take 20 mg daily by mouth.   Yes [provider]  digoxin (LANOXIN) 0.125 MG tablet Take 1 tablet (0.125 mg total) by mouth  daily. 01/10/18  Yes Theora Gianotti, NP  insulin aspart (NOVOLOG) 100 UNIT/ML injection Inject 10 Units into the skin 3 (three) times daily before meals. Take 10 units PLUS sliding scale   Yes [provider]  insulin glargine (LANTUS) 100 UNIT/ML injection Inject 60 Units into the skin at bedtime.   Yes [provider]  LORazepam (ATIVAN) 1 MG tablet Take 1 mg by mouth every 8 (eight) hours.   Yes [provider]  midodrine (PROAMATINE) 2.5 MG tablet Take 1 tablet (2.5 mg total) by mouth 3 (three) times daily with meals. 12/30/17  Yes Wieting, Richard, MD  nicotine (NICODERM CQ - DOSED IN MG/24 HOURS) 14 mg/24hr patch Place 1 patch (14 mg total) onto the skin daily. Okay to substitute generic patch 12/30/17 12/30/18 Yes Wieting, Richard, MD  ondansetron (ZOFRAN) 4 MG tablet Take 1 tablet (4 mg total) by mouth daily as needed. 01/05/18  Yes Orbie Pyo, MD  oxyCODONE-acetaminophen (PERCOCET/ROXICET) 5-325 MG tablet Take 1 tablet by  mouth every 6 (six) hours as needed for moderate pain. 12/30/17  Yes Wieting, Richard, MD  ticagrelor (BRILINTA) 90 MG TABS tablet Take 1 tablet (90 mg total) by mouth 2 (two) times daily. 01/10/18 03/11/18 Yes Theora Gianotti, NP      VITAL SIGNS:  Blood pressure 119/67, pulse 69, temperature 97.9 F (36.6 C), temperature source Oral, resp. rate 12, height 5\' 1"  (1.549 m), weight 133 lb (60.3 kg), SpO2 97 %.  PHYSICAL EXAMINATION:  Physical Exam  GENERAL:  52 y.o.-year-old patient lying in the bed with no acute distress.  EYES: Pupils equal, round, reactive to light and accommodation. No scleral icterus. Extraocular muscles intact.  HEENT: Head atraumatic, normocephalic. Oropharynx and nasopharynx clear.  NECK:  Supple, no jugular venous distention. No thyroid enlargement, no tenderness.  LUNGS: Normal breath sounds bilaterally, no wheezing, rales,rhonchi or crepitation. No use of accessory muscles of  respiration.  CARDIOVASCULAR: S1, S2 normal. No murmurs, rubs, or gallops.  ABDOMEN: Soft, nontender, nondistended. Bowel sounds present. No organomegaly or mass.  EXTREMITIES: No pedal edema, cyanosis, or clubbing.  NEUROLOGIC: Cranial nerves II through XII are intact. Muscle strength 5/5 in all extremities. Sensation intact. Gait not checked.  PSYCHIATRIC: The patient is alert and oriented x 3.  SKIN: No obvious rash, lesion, or ulcer.   LABORATORY PANEL:   CBC Recent Labs  Lab 01/15/18 1022  WBC 9.0  HGB 13.8  HCT 40.4  PLT 237   ------------------------------------------------------------------------------------------------------------------  Chemistries  Recent Labs  Lab 01/15/18 1022  NA 135  K 4.1  CL 103  CO2 25  GLUCOSE 391*  BUN 17  CREATININE 0.56  CALCIUM 8.9   ------------------------------------------------------------------------------------------------------------------  Cardiac Enzymes Recent Labs  Lab 01/15/18 1022  TROPONINI <0.03   ------------------------------------------------------------------------------------------------------------------  RADIOLOGY:  Dg Chest Port 1 View  Result Date: 01/15/2018 CLINICAL DATA:  Chest pain EXAM: PORTABLE CHEST 1 VIEW COMPARISON:  07/17/2017 chest radiograph. FINDINGS: Stable cardiomediastinal silhouette with normal heart size. No pneumothorax. No pleural effusion. Lungs appear clear, with no acute consolidative airspace disease and no pulmonary edema. IMPRESSION: No active disease. Electronically Signed   By: Ilona Sorrel M.D.   On: 01/15/2018 10:50      IMPRESSION AND PLAN:   CAD with unstable angina. The patient will be admitted to telemetry floor. Continue heparin drip, aspirin, Brilinta and Lipitor, follow-up with troponin level, cardiac cath PRN per cardiology consult.  Hypertension.  Continue hypertension medication.  Diabetes.  Continue home Lantus and start sliding scale. COPD.  Stable.   Nebulizer as needed. Tobacco abuse.  Smoking cessation was counseled for 3 to 4 minutes.  Nicotine patch.  All the records are reviewed and case discussed with ED provider. Management plans discussed with the patient, family and they are in agreement.  CODE STATUS: Full code  TOTAL TIME TAKING CARE OF THIS PATIENT: 42 minutes.    Demetrios Loll M.D on 01/15/2018 at 1:11 PM  Between 7am to 6pm - Pager - [phone]  After 6pm go to [email or web address] - Proofreader  [facility]  Office  [phone]  CC: Primary care physician; Ro, [name], MD   Note: This dictation was prepared with Dragon dictation along with smaller phrase technology. Any transcriptional errors that result from this process are unin

## 2018-01-15 NOTE — Progress Notes (Signed)
   01/15/18 2110  Clinical Encounter Type  Visited With Patient  Visit Type Initial;Spiritual support  Referral From Nurse  Consult/Referral To Chaplain  Spiritual Encounters  Spiritual Needs Prayer;Emotional  Stress Factors  Patient Stress Factors Health changes;Loss of control   CH received a page from the unit clerk for 2A that [name] may need a visit. I reported to the patient's room and found Renee Mack in the dark watching TV. She appeared to be crying or just stopped crying. She is very worried about possible heart cath tomorrow. This will make her third this month. I practiced active listening, pastoral ministry and the gift of laughter to encourage and support Ms. Awad. I will follow up as needed.

## 2018-01-15 NOTE — Progress Notes (Signed)
[facility] for Heparin Indication: chest pain/ACS  Allergies  Allergen Reactions  . Bee Venom Itching, Shortness Of Breath and Swelling  . Metformin And Related Shortness Of Breath  . Darvon [Propoxyphene] Itching  . Gabapentin Swelling  . Nsaids Other (See Comments)    Ulcers   . Tramadol Hives    Patient Measurements: Height: 5\' 1"  (154.9 cm) Weight: 133 lb (60.3 kg) IBW/kg (Calculated) : 47.8 Heparin Dosing Weight: 60 kg  Vital Signs: Temp: 97.9 F (36.6 C) (07/28 1006) Temp Source: Oral (07/28 1006) BP: 117/66 (07/28 1130) Pulse Rate: 69 (07/28 1006)  Labs: Recent Labs    01/15/18 1022  HGB 13.8  HCT 40.4  PLT 237  APTT 27  LABPROT 12.5  INR 0.94  CREATININE 0.56  TROPONINI <0.03    Estimated Creatinine Clearance: 67 mL/min (by C-G formula based on SCr of 0.56 mg/dL).   Medical History: Past Medical History:  Diagnosis Date  . Cancer ([place])    vulvular  . Chronic combined systolic (congestive) and diastolic (congestive) heart failure ([place])    a. 12/2017 Echo: EF 30-35%, mid-apicalanteroseptal, ant, and apical AK. Gr1 DD. Mild conc LVH.  Marland Kitchen COPD (chronic obstructive pulmonary disease) (HCC)    not on home oxygen  . Coronary artery disease    a. 12/2017 ACS/PCI: LM nl, LAD 100p (2.25x26 Resolute Onyx DES), 75m (2.0x12 Resolute Onyx DES), 80d (2.0x15 Resolute Onyx DES), LCX 30p, RCA 90p (non-dominant). EF 25-35%. Post-MI course complicated by cardiogenic shock req vasopressor Rx; b. 12/2017 NSTEMI/subacute thrombosis-->LAD 100 (PTCA + DES x 1).  . Diabetes mellitus without complication ([place])   . GERD (gastroesophageal reflux disease)   . HLD (hyperlipidemia)   . Ischemic cardiomyopathy    a. 12/2017 Echo: EF 30-35%.  . Myocardial infarction ([place])    a. 12/2017-->DES to LAD x 3.  . Pancreatitis   . Shingles   . Tobacco abuse     Assessment: 52 y/o F admitted with ACS not on anticoagulants PTA.   Goal of Therapy:   Heparin level 0.3-0.7 units/ml Monitor platelets by anticoagulation protocol: Yes   Plan:  Give 3600 units bolus x 1 Start heparin infusion at 700 units/hr Check anti-Xa level in 6 hours and daily while on heparin Continue to monitor H&H and platelets  Renee Mack, Renee Mack D 01/15/2018,11:50 AM

## 2018-01-15 NOTE — Progress Notes (Signed)
[facility] for Heparin Indication: chest pain/ACS  Allergies  Allergen Reactions  . Bee Venom Itching, Shortness Of Breath and Swelling  . Metformin And Related Shortness Of Breath  . Darvon [Propoxyphene] Itching  . Gabapentin Swelling  . Nsaids Other (See Comments)    Ulcers   . Tramadol Hives    Patient Measurements: Height: 5\' 1"  (154.9 cm) Weight: 133 lb (60.3 kg) IBW/kg (Calculated) : 47.8 Heparin Dosing Weight: 60 kg  Vital Signs: Temp: 98.7 F (37.1 C) (07/28 1657) Temp Source: Oral (07/28 1657) BP: 106/63 (07/28 1657) Pulse Rate: 74 (07/28 1657)  Labs: Recent Labs    01/15/18 1022 01/15/18 1355 01/15/18 1817  HGB 13.8  --   --   HCT 40.4  --   --   PLT 237  --   --   APTT 27  --   --   LABPROT 12.5  --   --   INR 0.94  --   --   HEPARINUNFRC  --   --  0.13*  CREATININE 0.56  --   --   TROPONINI <0.03 <0.03  --     Estimated Creatinine Clearance: 67 mL/min (by C-G formula based on SCr of 0.56 mg/dL).   Medical History: Past Medical History:  Diagnosis Date  . Cancer ([place])    vulvular  . Chronic combined systolic (congestive) and diastolic (congestive) heart failure ([place])    a. 12/2017 Echo: EF 30-35%, mid-apicalanteroseptal, ant, and apical AK. Gr1 DD. Mild conc LVH.  Marland Kitchen COPD (chronic obstructive pulmonary disease) ([facility])    not on home oxygen  . Coronary artery disease    a. 12/2017 ACS/PCI: LM nl, LAD 100p (2.25x26 Resolute Onyx DES), 56m (2.0x12 Resolute Onyx DES), 80d (2.0x15 Resolute Onyx DES), LCX 30p, RCA 90p (non-dominant). EF 25-35%. Post-MI course complicated by cardiogenic shock req vasopressor Rx; b. 12/2017 NSTEMI/subacute thrombosis-->LAD 100 (PTCA + DES x 1).  . Diabetes mellitus without complication ([place])   . GERD (gastroesophageal reflux disease)   . HLD (hyperlipidemia)   . Ischemic cardiomyopathy    a. 12/2017 Echo: EF 30-35%.  . Myocardial infarction ([place])    a. 12/2017-->DES to LAD x 3.  .  Pancreatitis   . Shingles   . Tobacco abuse     Assessment: 52 y/o F admitted with ACS not on anticoagulants PTA.   Goal of Therapy:  Heparin level 0.3-0.7 units/ml Monitor platelets by anticoagulation protocol: Yes   Plan:  7/28 1846 HL 0.13. Level is subtherapeutic. Will order heparin 1800unit bolus and increase infusion rate to 900 units/hr. Recheck HL in 6 hours.   Pernell Dupre, PharmD, BCPS Clinical Pharmacist 01/15/2018 7:07 PM

## 2018-01-15 NOTE — ED Provider Notes (Signed)
[facility] Emergency Department Provider Note ____________________________________________   First MD Initiated Contact with Patient 01/15/18 1000     (approximate)  I have reviewed the triage vital signs and the nursing notes.   HISTORY  Chief Complaint Chest Pain  HPI Renee Mack is a 52 y.o. female for evaluation of chest pain  Patient reports about 6:00 this morning when she woke up she been experiencing a heavy crushing 10 out of 10 chest pain.  Feels like the last time she had a heart attack and a blocked stent about 2 to 3 weeks ago.  She took one nitroglycerin at home, took 1 baby aspirin and was given 3 additional baby aspirin at the EMS.  She reports her symptoms have since come down slightly but she still having about 8 or 9 out of 10 discomfort.  Reports she had the same when she had a partially blocked stent just a few weeks ago.  She did have some intermittent abdominal pain, reports she was treated for UTI and notes better now.  Not having abdominal pain.  The pain is not ripping or tearing.  It is located in her mid chest and radiates pain towards her shoulders.  She reports she had the pain multiple times in the past and saw and been attributed to heart problems.  Currently wearing a external defibrillator as well.  Past Medical History:  Diagnosis Date  . Cancer ([place])    vulvular  . Chronic combined systolic (congestive) and diastolic (congestive) heart failure ([place])    a. 12/2017 Echo: EF 30-35%, mid-apicalanteroseptal, ant, and apical AK. Gr1 DD. Mild conc LVH.  Marland Kitchen COPD (chronic obstructive pulmonary disease) ([facility])    not on home oxygen  . Coronary artery disease    a. 12/2017 ACS/PCI: LM nl, LAD 100p (2.25x26 Resolute Onyx DES), 73m (2.0x12 Resolute Onyx DES), 80d (2.0x15 Resolute Onyx DES), LCX 30p, RCA 90p (non-dominant). EF 25-35%. Post-MI course complicated by cardiogenic shock req vasopressor Rx; b. 12/2017 NSTEMI/subacute  thrombosis-->LAD 100 (PTCA + DES x 1).  . Diabetes mellitus without complication ([place])   . GERD (gastroesophageal reflux disease)   . HLD (hyperlipidemia)   . Ischemic cardiomyopathy    a. 12/2017 Echo: EF 30-35%.  . Myocardial infarction ([place])    a. 12/2017-->DES to LAD x 3.  . Pancreatitis   . Shingles   . Tobacco abuse     Patient Active Problem List   Diagnosis Date Noted  . Non-ST elevation (NSTEMI) myocardial infarction ([place])   . Chronic systolic heart failure ([place])   . Ischemic chest pain 12/26/2017  . Chest pain 12/26/2017  . Unstable angina ([place])   . Acute ST elevation myocardial infarction (STEMI) of anterior wall ([place])   . Ischemic cardiomyopathy   . Acute systolic heart failure ([place])   . Cardiogenic shock ([place])   . Tobacco abuse   . Acute ST elevation myocardial infarction (STEMI) involving left anterior descending (LAD) coronary artery ([place]) 12/19/2017  . Protein-calorie malnutrition, severe 07/18/2017  . DKA (diabetic ketoacidoses) ([place]) 07/17/2017  . Acute ischemic enteritis ([place]) 06/12/2017  . Pyelonephritis 12/28/2014  . Type 2 diabetes mellitus ([place]) 12/28/2014  . COPD (chronic obstructive pulmonary disease) ([place]) 12/28/2014  . GERD (gastroesophageal reflux disease) 12/28/2014  . History of shingles 12/28/2014  . Hyperlipidemia LDL goal <70 12/28/2014    Past Surgical History:  Procedure Laterality Date  . ABDOMINAL HYSTERECTOMY     partial  . CORONARY STENT INTERVENTION N/A 12/26/2017  Procedure: CORONARY STENT INTERVENTION;  Surgeon: Wellington Hampshire, MD;  Location: [place] [facility];  Service: Cardiovascular;  Laterality: N/A;  . CORONARY/GRAFT ACUTE MI REVASCULARIZATION N/A 12/19/2017   Procedure: Coronary/Graft Acute MI Revascularization;  Surgeon: Wellington Hampshire, MD;  Location: [place] [facility];  Service: Cardiovascular;  Laterality: N/A;  . ECTOPIC PREGNANCY SURGERY Left   . LEFT HEART CATH AND CORONARY ANGIOGRAPHY N/A 12/19/2017    Procedure: LEFT HEART CATH AND CORONARY ANGIOGRAPHY;  Surgeon: Wellington Hampshire, MD;  Location: [place] [facility];  Service: Cardiovascular;  Laterality: N/A;  . LEFT HEART CATH AND CORONARY ANGIOGRAPHY N/A 12/26/2017   Procedure: LEFT HEART CATH AND CORONARY ANGIOGRAPHY;  Surgeon: Wellington Hampshire, MD;  Location: [place] [facility];  Service: Cardiovascular;  Laterality: N/A;  . LYMPHADENECTOMY    . SPLENECTOMY, PARTIAL    . vulvulasectomy      Prior to Admission medications   Medication Sig Start Date End Date Taking? Authorizing Provider  acetaminophen (TYLENOL) 325 MG tablet Take 650 mg by mouth every 6 (six) hours as needed for mild pain.     [provider]  albuterol (PROAIR HFA) 108 (90 Base) MCG/ACT inhaler Inhale 1 puff into the lungs every 6 (six) hours as needed for wheezing or shortness of breath.     [provider]  aspirin 81 MG tablet Take 81 mg by mouth daily.    [provider]  atorvastatin (LIPITOR) 80 MG tablet Take 1 tablet (80 mg total) by mouth daily at 6 PM. 01/10/18 03/11/18  Theora Gianotti, NP  cephALEXin (KEFLEX) 500 MG capsule Take 1 capsule (500 mg total) by mouth 3 (three) times daily for 10 days. 01/05/18 01/15/18  Orbie Pyo, MD  citalopram (CELEXA) 20 MG tablet Take 20 mg daily by mouth.    [provider]  digoxin (LANOXIN) 0.125 MG tablet Take 1 tablet (0.125 mg total) by mouth daily. 01/10/18   Theora Gianotti, NP  insulin aspart (NOVOLOG) 100 UNIT/ML injection Inject 10 Units into the skin 3 (three) times daily before meals. Take 10 units PLUS sliding scale    [provider]  insulin glargine (LANTUS) 100 UNIT/ML injection Inject 60 Units into the skin at bedtime.    [provider]  LORazepam (ATIVAN) 1 MG tablet Take 1 mg by mouth every 8 (eight) hours.    [provider]  midodrine (PROAMATINE) 2.5 MG tablet Take 1 tablet (2.5 mg total) by mouth 3 (three)  times daily with meals. 12/30/17   Loletha Grayer, MD  nicotine (NICODERM CQ - DOSED IN MG/24 HOURS) 14 mg/24hr patch Place 1 patch (14 mg total) onto the skin daily. Okay to substitute generic patch 12/30/17 12/30/18  Loletha Grayer, MD  ondansetron (ZOFRAN) 4 MG tablet Take 1 tablet (4 mg total) by mouth daily as needed. 01/05/18   Schaevitz, Randall An, MD  oxyCODONE-acetaminophen (PERCOCET/ROXICET) 5-325 MG tablet Take 1 tablet by mouth every 6 (six) hours as needed for moderate pain. 12/30/17   Loletha Grayer, MD  ticagrelor (BRILINTA) 90 MG TABS tablet Take 1 tablet (90 mg total) by mouth 2 (two) times daily. 01/10/18 03/11/18  Theora Gianotti, NP    Allergies Bee venom; Metformin and related; Darvon [propoxyphene]; Gabapentin; Nsaids; and Tramadol  Family History  Problem Relation Age of Onset  . Cancer Mother   . Osteoporosis Sister   . Heart disease Brother   . Heart attack Father  Social History Social History   Tobacco Use  . Smoking status: Former Smoker    Packs/day: 0.50    Years: 30.00    Pack years: 15.00    Types: Cigarettes  . Smokeless tobacco: Never Used  . Tobacco comment: quit 12/19/2017.  Substance Use Topics  . Alcohol use: No  . Drug use: No    Review of Systems Constitutional: No fever/chills Eyes: No visual changes. ENT: No sore throat. Cardiovascular: See HPI respiratory: Denies shortness of breath. Gastrointestinal: No abdominal pain.  No nausea, no vomiting.  No diarrhea.  No constipation. Genitourinary: Negative for dysuria. Musculoskeletal: Negative for back pain. Skin: Negative for rash. Neurological: Negative for headaches, focal weakness or numbness.    ____________________________________________   PHYSICAL EXAM:  VITAL SIGNS: ED Triage Vitals  Enc Vitals Group     BP 01/15/18 1006 116/68     Pulse Rate 01/15/18 1006 69     Resp 01/15/18 1006 13     Temp 01/15/18 1006 97.9 F (36.6 C)     Temp Source 01/15/18  1006 Oral     SpO2 01/15/18 1006 97 %     Weight 01/15/18 1007 133 lb (60.3 kg)     Height 01/15/18 1007 5\' 1"  (1.549 m)     Head Circumference --      Peak Flow --      Pain Score 01/15/18 1006 9     Pain Loc --      Pain Edu? --      Excl. in [place]? --     Constitutional: Alert and oriented.  Appears in moderate discomfort, appears to be having chest pain.  Looks uncomfortable. Eyes: Conjunctivae are normal. Head: Atraumatic. Nose: No congestion/rhinnorhea. Mouth/Throat: Mucous membranes are moist. Neck: No stridor.   Cardiovascular: Normal rate, regular rhythm. Grossly normal heart sounds.  Good peripheral circulation. Respiratory: Normal respiratory effort.  No retractions. Lungs CTAB. Gastrointestinal: Soft and nontender. No distention. Musculoskeletal: No lower extremity tenderness nor edema. Neurologic:  Normal speech and language. No gross focal neurologic deficits are appreciated.  Skin:  Skin is warm, dry and intact. No rash noted. Psychiatric: Mood and affect are normal. Speech and behavior are normal.  ____________________________________________   LABS (all labs ordered are listed, but only abnormal results are displayed)  Labs Reviewed  BASIC METABOLIC PANEL - Abnormal; Notable for the following components:      Result Value   Glucose, Bld 391 (*)    All other components within normal limits  CBC  TROPONIN I  PROTIME-INR  APTT   ____________________________________________  EKG  Reviewed and interpreted by me at 10:05 AM Heart rate 70 QRS 99 QTc 460 Normal sinus rhythm, old evidence of probable lateral infarct anteroseptal infarcts.  T wave inversion in V2 1 and aVL.  Compared with previous EKG appears similar.  No obvious acute ischemic changes as compared to previous. ____________________________________________  [id number]  Dg Chest Port 1 View  Result Date: 01/15/2018 CLINICAL DATA:  Chest pain EXAM: PORTABLE CHEST 1 VIEW COMPARISON:  07/17/2017  chest radiograph. FINDINGS: Stable cardiomediastinal silhouette with normal heart size. No pneumothorax. No pleural effusion. Lungs appear clear, with no acute consolidative airspace disease and no pulmonary edema. IMPRESSION: No active disease. Electronically Signed   By: Ilona Sorrel M.D.   On: 01/15/2018 10:50    ____________________________________________   PROCEDURES  Procedure(s) performed: None  Procedures  Critical Care performed: Yes, see critical care note(s)  CRITICAL CARE Performed by: Delman Kitten  Total critical care time: 38 minutes  Critical care time was exclusive of separately billable procedures and treating other patients.  Critical care was necessary to treat or prevent imminent or life-threatening deterioration.  Critical care was time spent personally by me on the following activities: development of treatment plan with patient and/or surrogate as well as nursing, discussions with consultants, evaluation of patient's response to treatment, examination of patient, obtaining history from patient or surrogate, ordering and performing treatments and interventions, ordering and review of laboratory studies, ordering and review of radiographic studies, pulse oximetry and re-evaluation of patient's condition.  ____________________________________________   INITIAL IMPRESSION / ASSESSMENT AND PLAN / ED COURSE  Pertinent labs & imaging results that were available during my care of the patient were reviewed by me and considered in my medical decision making (see chart for details).  Differential diagnosis includes, but is not limited to, ACS, aortic dissection, pulmonary embolism, cardiac tamponade, pneumothorax, pneumonia, pericarditis, myocarditis, GI-related causes including esophagitis/gastritis, and musculoskeletal chest wall pain.     Clinical Course as of Jan 16 1144  Sun Jan 15, 2018  1115 Patient reports pain is back down to about a 5, feeling improved.  I  have discussed case with Dr. Susanne Greenhouse of cardiology, he was seated and advised on further recommendations in the ER.   [MQ]    Clinical Course User Index [MQ] Delman Kitten, MD   Patient seen by cardiology, her pain has subsided quite a bit and she is feeling improved with nitroglycerin and fentanyl but continues to have some slight chest pain.  Cardiology advising treatment for unstable angina at this point, admission for ongoing monitoring and to start heparin infusion.  Patient understanding agreeable with plan for admission.  Appears stable for further admission on her hospital service.  Cardiology is seen and consulted.  ____________________________________________   FINAL CLINICAL IMPRESSION(S) / ED DIAGNOSES  Final diagnoses:  Unstable angina ([place])      NEW MEDICATIONS STARTED DURING THIS VISIT:  New Prescriptions   No medications on file     Note:  This document was prepared using Dragon voice recognition software and may include unintentional dictation errors.     Delman Kitten, MD 01/15/18 1145

## 2018-01-15 NOTE — ED Notes (Signed)
EMS IV removed at this time.

## 2018-01-15 NOTE — Consult Note (Signed)
Cardiology Consultation:   Patient ID: Renee Mack; [id number]; 01-29-1966   Admit date: 01/15/2018 Date of Consult: 01/15/2018  Primary Care Provider: Janalyn Shy, MD Primary Cardiologist: [name], MD    Patient Profile:   Renee Mack is a 52 y.o. female with a history of CAD, recent anterior MI, ischemic cardiomyopathy, chronic systolic CHF, tobacco abuse, DM, HLD and pancreatitis presenting to the ED with c/o chest pain. I am seeing her today for the evaluation of chest pain at the request of Dr. Jacqualine Code  History of Present Illness:   Renee Mack is a 52 yo female with history of CAD, recent anterior MI, ischemic cardiomyopathy, chronic systolic CHF, tobacco abuse, DM, HLD and pancreatitis presenting to the ED with c/o chest pain. She has had several recent admissions with acute coronary syndrome. She was admitted to [facility] 12/19/17 with an anterior STEMI and was found to have LAD occlusion with high grade disease in a non-dominant RCA. 3 drug eluting stents were placed in the LAD. Her hospital course was complicated by cardiogenic shock. LVEF was 30-35% prior to discharge. She was discharged home on 12/21/17 with a Lifevest. She was readmitted 12/26/17 with chest pain, nausea and vomiting and repeat cardiac cath showed occlusion of the proximal LAD with a large thrombus burden. This was treated with balloon angioplasty and placement of a proximal LAD stent. Her anterior wall is felt to be scar tissue. Her blood pressure has not tolerated addition of beta blockers or Ace-inh/ARB. She has been on ASA/Brilinta/statin and digoxin.   She began having chest pain earlier this am around 6am. She took one NTG at home but her chest pain persisted. This is the same pain she has been having over the past few weeks but more severe today. The pain radiates from her chest toward her shoulders. No nausea or dyspnea.   Past Medical History:  Diagnosis Date  . Cancer ([place])    vulvular  . Chronic combined  systolic (congestive) and diastolic (congestive) heart failure ([place])    a. 12/2017 Echo: EF 30-35%, mid-apicalanteroseptal, ant, and apical AK. Gr1 DD. Mild conc LVH.  Marland Kitchen COPD (chronic obstructive pulmonary disease) (HCC)    not on home oxygen  . Coronary artery disease    a. 12/2017 ACS/PCI: LM nl, LAD 100p (2.25x26 Resolute Onyx DES), 63m (2.0x12 Resolute Onyx DES), 80d (2.0x15 Resolute Onyx DES), LCX 30p, RCA 90p (non-dominant). EF 25-35%. Post-MI course complicated by cardiogenic shock req vasopressor Rx; b. 12/2017 NSTEMI/subacute thrombosis-->LAD 100 (PTCA + DES x 1).  . Diabetes mellitus without complication ([place])   . GERD (gastroesophageal reflux disease)   . HLD (hyperlipidemia)   . Ischemic cardiomyopathy    a. 12/2017 Echo: EF 30-35%.  . Myocardial infarction ([place])    a. 12/2017-->DES to LAD x 3.  . Pancreatitis   . Shingles   . Tobacco abuse     Past Surgical History:  Procedure Laterality Date  . ABDOMINAL HYSTERECTOMY     partial  . CORONARY STENT INTERVENTION N/A 12/26/2017   Procedure: CORONARY STENT INTERVENTION;  Surgeon: Wellington Hampshire, MD;  Location: [place] [facility];  Service: Cardiovascular;  Laterality: N/A;  . CORONARY/GRAFT ACUTE MI REVASCULARIZATION N/A 12/19/2017   Procedure: Coronary/Graft Acute MI Revascularization;  Surgeon: Wellington Hampshire, MD;  Location: [place] [facility];  Service: Cardiovascular;  Laterality: N/A;  . ECTOPIC PREGNANCY SURGERY Left   . LEFT HEART CATH AND CORONARY ANGIOGRAPHY N/A 12/19/2017   Procedure: LEFT HEART CATH AND  CORONARY ANGIOGRAPHY;  Surgeon: Wellington Hampshire, MD;  Location: [place] [facility];  Service: Cardiovascular;  Laterality: N/A;  . LEFT HEART CATH AND CORONARY ANGIOGRAPHY N/A 12/26/2017   Procedure: LEFT HEART CATH AND CORONARY ANGIOGRAPHY;  Surgeon: Wellington Hampshire, MD;  Location: [place] [facility];  Service: Cardiovascular;  Laterality: N/A;  . LYMPHADENECTOMY    . SPLENECTOMY, PARTIAL    . vulvulasectomy        Home Medications:  Prior to Admission medications   Medication Sig Start Date End Date Taking? Authorizing Provider  acetaminophen (TYLENOL) 325 MG tablet Take 650 mg by mouth every 6 (six) hours as needed for mild pain.     [provider]  albuterol (PROAIR HFA) 108 (90 Base) MCG/ACT inhaler Inhale 1 puff into the lungs every 6 (six) hours as needed for wheezing or shortness of breath.     [provider]  aspirin 81 MG tablet Take 81 mg by mouth daily.    [provider]  atorvastatin (LIPITOR) 80 MG tablet Take 1 tablet (80 mg total) by mouth daily at 6 PM. 01/10/18 03/11/18  Theora Gianotti, NP  cephALEXin (KEFLEX) 500 MG capsule Take 1 capsule (500 mg total) by mouth 3 (three) times daily for 10 days. 01/05/18 01/15/18  Orbie Pyo, MD  citalopram (CELEXA) 20 MG tablet Take 20 mg daily by mouth.    [provider]  digoxin (LANOXIN) 0.125 MG tablet Take 1 tablet (0.125 mg total) by mouth daily. 01/10/18   Theora Gianotti, NP  insulin aspart (NOVOLOG) 100 UNIT/ML injection Inject 10 Units into the skin 3 (three) times daily before meals. Take 10 units PLUS sliding scale    [provider]  insulin glargine (LANTUS) 100 UNIT/ML injection Inject 60 Units into the skin at bedtime.    [provider]  LORazepam (ATIVAN) 1 MG tablet Take 1 mg by mouth every 8 (eight) hours.    [provider]  midodrine (PROAMATINE) 2.5 MG tablet Take 1 tablet (2.5 mg total) by mouth 3 (three) times daily with meals. 12/30/17   Loletha Grayer, MD  nicotine (NICODERM CQ - DOSED IN MG/24 HOURS) 14 mg/24hr patch Place 1 patch (14 mg total) onto the skin daily. Okay to substitute generic patch 12/30/17 12/30/18  Loletha Grayer, MD  ondansetron (ZOFRAN) 4 MG tablet Take 1 tablet (4 mg total) by mouth daily as needed. 01/05/18   Schaevitz, Randall An, MD  oxyCODONE-acetaminophen (PERCOCET/ROXICET) 5-325 MG tablet Take 1  tablet by mouth every 6 (six) hours as needed for moderate pain. 12/30/17   Loletha Grayer, MD  ticagrelor (BRILINTA) 90 MG TABS tablet Take 1 tablet (90 mg total) by mouth 2 (two) times daily. 01/10/18 03/11/18  Theora Gianotti, NP    Inpatient Medications: Scheduled Meds: . fentaNYL (SUBLIMAZE) injection  25 mcg Intravenous Once   Continuous Infusions:  PRN Meds:   Allergies:    Allergies  Allergen Reactions  . Bee Venom Itching, Shortness Of Breath and Swelling  . Metformin And Related Shortness Of Breath  . Darvon [Propoxyphene] Itching  . Gabapentin Swelling  . Nsaids Other (See Comments)    Ulcers   . Tramadol Hives    Social History:   Social History   Socioeconomic History  . Marital status: Widowed    Spouse name: Not on file  . Number of children: Not on file  . Years of education: Not on file  . Highest education level: Not on  file  Occupational History  . Not on file  Social Needs  . Financial resource strain: Not on file  . Food insecurity:    Worry: Not on file    Inability: Not on file  . Transportation needs:    Medical: Not on file    Non-medical: Not on file  Tobacco Use  . Smoking status: Former Smoker    Packs/day: 0.50    Years: 30.00    Pack years: 15.00    Types: Cigarettes  . Smokeless tobacco: Never Used  . Tobacco comment: quit 12/19/2017.  Substance and Sexual Activity  . Alcohol use: No  . Drug use: No  . Sexual activity: Not on file  Lifestyle  . Physical activity:    Days per week: Not on file    Minutes per session: Not on file  . Stress: Not on file  Relationships  . Social connections:    Talks on phone: Not on file    Gets together: Not on file    Attends religious service: Not on file    Active member of club or organization: Not on file    Attends meetings of clubs or organizations: Not on file    Relationship status: Not on file  . Intimate partner violence:    Fear of current or ex partner: Not on file     Emotionally abused: Not on file    Physically abused: Not on file    Forced sexual activity: Not on file  Other Topics Concern  . Not on file  Social History Narrative   Lives in [place] by herself.  Does not routinely exercise.    Family History:   Family History  Problem Relation Age of Onset  . Cancer Mother   . Osteoporosis Sister   . Heart disease Brother   . Heart attack Father      ROS:  Please see the history of present illness.  All other ROS reviewed and negative.     Physical Exam/Data:   Vitals:   01/15/18 1006 01/15/18 1007  BP: 116/68   Pulse: 69   Resp: 13   Temp: 97.9 F (36.6 C)   TempSrc: Oral   SpO2: 97%   Weight:  133 lb (60.3 kg)  Height:  5\' 1"  (1.549 m)   No intake or output data in the 24 hours ending 01/15/18 1106 Filed Weights   01/15/18 1007  Weight: 133 lb (60.3 kg)   Body mass index is 25.13 kg/m.  General:  Well nourished, well developed, appears uncomfortable HEENT: normal Lymph: no adenopathy Neck: no JVD Endocrine:  No thryomegaly Vascular: No carotid bruits; FA pulses 2+ bilaterally without bruits  Cardiac:  normal S1, S2; RRR; no murmur  Lungs:  clear to auscultation bilaterally, no wheezing, rhonchi or rales  Abd: soft, nontender, no hepatomegaly  Ext: no edema Musculoskeletal:  No deformities, BUE and BLE strength normal and equal Skin: warm and dry  Neuro:  CNs 2-12 intact, no focal abnormalities noted Psych:  Normal affect   EKG:  The EKG was personally reviewed and demonstrates:  Sinus, Prior septal infarct. No injury current.  Telemetry:  Telemetry was personally reviewed and demonstrates:  Sinus brady  Relevant CV Studies:  Laboratory Data:  ChemistryNo results for input(s): NA, K, CL, CO2, GLUCOSE, BUN, CREATININE, CALCIUM, GFRNONAA, GFRAA, ANIONGAP in the last 168 hours.  No results for input(s): PROT, ALBUMIN, AST, ALT, ALKPHOS, BILITOT in the last 168 hours. Hematology Recent Labs  Lab  01/15/18 1022  WBC 9.0  RBC 4.30  HGB 13.8  HCT 40.4  MCV 94.0  MCH 32.2  MCHC 34.2  RDW 14.1  PLT 237   Cardiac EnzymesNo results for input(s): TROPONINI in the last 168 hours. No results for input(s): TROPIPOC in the last 168 hours.  BNPNo results for input(s): BNP, PROBNP in the last 168 hours.  DDimer No results for input(s): DDIMER in the last 168 hours.  Radiology/Studies:  Dg Chest Port 1 View  Result Date: 01/15/2018 CLINICAL DATA:  Chest pain EXAM: PORTABLE CHEST 1 VIEW COMPARISON:  07/17/2017 chest radiograph. FINDINGS: Stable cardiomediastinal silhouette with normal heart size. No pneumothorax. No pleural effusion. Lungs appear clear, with no acute consolidative airspace disease and no pulmonary edema. IMPRESSION: No active disease. Electronically Signed   By: Ilona Sorrel M.D.   On: 01/15/2018 10:50    Assessment and Plan:   1. CAD with unstable angina: She has had several recent acute coronary syndrome presentations, initially on 12/20/17 with an acute anterior MI. 3 Drug eluting stents were placed. Readmitted 12/26/17 with chest pain and elevated troponin on 12/26/17 and her LAD was re-occluded. Another DES was placed in the LAD. She has been compliant with her ASA and Brilinta. She has recurrent chest pain that is reminiscent of her prior angina.  Her first troponin is negative. Her EKG shows no injury current. Her anterior wall is felt to have significant scar burden. The LAD may not remain open despite attempts. The small to moderate caliber non-dominant RCA also has disease. At this time, no objective evidence of ischemia. -I would admit to the hospitalist service to telemetry today. Cycle troponin. Agree with IV heparin given her history and presentation. NTG paste in place. Will follow enzymes and decide on need for repeat cardiac cath this week. If her troponin becomes positive today and pain persists, will have to consider cardiac cath today. This may have to be done at  [facility] as there are no ICU beds here at [facility].    For questions or updates, please contact [place] Please consult [email or web address] for contact info under Cardiology/STEMI.   Signed, Lauree Chandler, MD  01/15/2018 11:06 AM

## 2018-01-16 DIAGNOSIS — I25118 Atherosclerotic heart disease of native coronary artery with other forms of angina pectoris: Secondary | ICD-10-CM

## 2018-01-16 DIAGNOSIS — E1165 Type 2 diabetes mellitus with hyperglycemia: Secondary | ICD-10-CM

## 2018-01-16 DIAGNOSIS — F17200 Nicotine dependence, unspecified, uncomplicated: Secondary | ICD-10-CM

## 2018-01-16 DIAGNOSIS — E118 Type 2 diabetes mellitus with unspecified complications: Secondary | ICD-10-CM

## 2018-01-16 DIAGNOSIS — R0789 Other chest pain: Secondary | ICD-10-CM

## 2018-01-16 LAB — CBC
HCT: 36.7 % (ref 35.0–47.0)
Hemoglobin: 12.8 g/dL (ref 12.0–16.0)
MCH: 32.2 pg (ref 26.0–34.0)
MCHC: 34.7 g/dL (ref 32.0–36.0)
MCV: 92.7 fL (ref 80.0–100.0)
PLATELETS: 213 10*3/uL (ref 150–440)
RBC: 3.96 MIL/uL (ref 3.80–5.20)
RDW: 13.8 % (ref 11.5–14.5)
WBC: 8.8 10*3/uL (ref 3.6–11.0)

## 2018-01-16 LAB — GLUCOSE, CAPILLARY
GLUCOSE-CAPILLARY: 112 mg/dL — AB (ref 70–99)
GLUCOSE-CAPILLARY: 175 mg/dL — AB (ref 70–99)
Glucose-Capillary: 339 mg/dL — ABNORMAL HIGH (ref 70–99)
Glucose-Capillary: 181 mg/dL — ABNORMAL HIGH (ref 70–99)

## 2018-01-16 LAB — HEPARIN LEVEL (UNFRACTIONATED)
HEPARIN UNFRACTIONATED: 0.23 [IU]/mL — AB (ref 0.30–0.70)
Heparin Unfractionated: 0.36 IU/mL (ref 0.30–0.70)

## 2018-01-16 MED ORDER — CYCLOBENZAPRINE HCL 10 MG PO TABS
5.0000 mg | ORAL_TABLET | Freq: Three times a day (TID) | ORAL | Status: DC
  Administered 2018-01-16 – 2018-01-17 (×3): 5 mg via ORAL
  Filled 2018-01-16 (×3): qty 1

## 2018-01-16 MED ORDER — HEPARIN BOLUS VIA INFUSION
900.0000 [IU] | Freq: Once | INTRAVENOUS | Status: AC
  Administered 2018-01-16: 900 [IU] via INTRAVENOUS
  Filled 2018-01-16: qty 900

## 2018-01-16 MED ORDER — SODIUM CHLORIDE 0.9% FLUSH
3.0000 mL | Freq: Two times a day (BID) | INTRAVENOUS | Status: DC
  Administered 2018-01-16 – 2018-01-17 (×2): 3 mL via INTRAVENOUS

## 2018-01-16 MED ORDER — SODIUM CHLORIDE 0.9% FLUSH
3.0000 mL | INTRAVENOUS | Status: DC | PRN
  Administered 2018-01-16 – 2018-01-17 (×2): 3 mL via INTRAVENOUS
  Filled 2018-01-16 (×2): qty 3

## 2018-01-16 NOTE — Progress Notes (Signed)
Progress Note  Patient Name: Renee Mack Date of Encounter: 01/16/2018  Primary Cardiologist: [name], MD   Subjective   Patient reports active chest pain that radiates into her left arm as well as pain in her neck and lower back. CP rated 8/10 and unchanged since admission. She also reports associated nausea but no recent episodes in emesis and an increase in LEE edema and some LUQ abdominal tenderness with palpation.   She again confirms medication compliance. She reports that she is unable to think of any inciting event for the recent worsening of CP other than doing more laundry than usual.   Inpatient Medications    Scheduled Meds: . aspirin EC  81 mg Oral Daily  . atorvastatin  80 mg Oral q1800  . citalopram  20 mg Oral Daily  . digoxin  0.125 mg Oral Daily  . insulin aspart  0-5 Units Subcutaneous QHS  . insulin aspart  0-9 Units Subcutaneous TID WC  . insulin aspart  10 Units Subcutaneous TID AC  . insulin glargine  60 Units Subcutaneous QHS  . LORazepam  1 mg Oral Q8H  . midodrine  2.5 mg Oral TID WC  . nicotine  14 mg Transdermal Q24H  . ticagrelor  90 mg Oral BID   Continuous Infusions: . heparin 1,000 Units/hr (01/16/18 0831)   PRN Meds: acetaminophen, albuterol, ALPRAZolam, morphine injection, nitroGLYCERIN, ondansetron (ZOFRAN) IV, oxyCODONE-acetaminophen, zolpidem   Vital Signs    Vitals:   01/16/18 0201 01/16/18 0248 01/16/18 0430 01/16/18 0809  BP: 108/73 103/61 (!) 93/54 95/66  Pulse: 65  64 65  Resp:   18   Temp:   97.7 F (36.5 C) 97.6 F (36.4 C)  TempSrc:   Oral Oral  SpO2: 97%  100% 99%  Weight:      Height:        Intake/Output Summary (Last 24 hours) at 01/16/2018 0951 Last data filed at 01/16/2018 0000 Gross per 24 hour  Intake 157.22 ml  Output -  Net 157.22 ml   Filed Weights   01/15/18 1007  Weight: 133 lb (60.3 kg)    Telemetry    NSR, 62-56bpm - Personally Reviewed  ECG    No new EKG since admission -  Personally Reviewed  Physical Exam   Physical Exam  Constitutional: She is oriented to person, place, and time. No distress.  HENT:  Head: Normocephalic and atraumatic.  Eyes: No scleral icterus.  Neck: Normal range of motion. No JVD present.  Cardiovascular: Regular rhythm, normal heart sounds and intact distal pulses. Exam reveals no gallop and no friction rub.  No murmur heard. Pulmonary/Chest: Effort normal. No respiratory distress. She has no wheezes. She has no rales. She exhibits no tenderness.  Abdominal: There is tenderness.  Musculoskeletal: She exhibits no edema.  Neurological: She is oriented to person, place, and time.  Skin: Skin is warm and dry. No rash noted. She is not diaphoretic. No erythema. No pallor.  Psychiatric: She has a normal mood and affect. Her behavior is normal. Judgment and thought content normal.     Labs    Chemistry Recent Labs  Lab 01/15/18 1022  NA 135  K 4.1  CL 103  CO2 25  GLUCOSE 391*  BUN 17  CREATININE 0.56  CALCIUM 8.9  GFRNONAA >60  GFRAA >60  ANIONGAP 7     Hematology Recent Labs  Lab 01/15/18 1022 01/16/18 0139  WBC 9.0 8.8  RBC 4.30 3.96  HGB 13.8 12.8  HCT 40.4 36.7  MCV 94.0 92.7  MCH 32.2 32.2  MCHC 34.2 34.7  RDW 14.1 13.8  PLT 237 213    Cardiac Enzymes Recent Labs  Lab 01/15/18 1022 01/15/18 1355 01/15/18 2009  TROPONINI <0.03 <0.03 <0.03   No results for input(s): TROPIPOC in the last 168 hours.   BNPNo results for input(s): BNP, PROBNP in the last 168 hours.   DDimer No results for input(s): DDIMER in the last 168 hours.   Radiology    Dg Chest Port 1 View  Result Date: 01/15/2018 CLINICAL DATA:  Chest pain EXAM: PORTABLE CHEST 1 VIEW COMPARISON:  07/17/2017 chest radiograph. FINDINGS: Stable cardiomediastinal silhouette with normal heart size. No pneumothorax. No pleural effusion. Lungs appear clear, with no acute consolidative airspace disease and no pulmonary edema. IMPRESSION: No  active disease. Electronically Signed   By: Ilona Sorrel M.D.   On: 01/15/2018 10:50    Cardiac Studies   12/26/2017  LHC 1.  Subacute stent thrombosis in the LAD likely due to poor outflow related to previous late presentation of anterior myocardial infarction as well as long segments of stents.  Collaterals are noted from the right coronary artery to the LAD. 2.  Moderately elevated left ventricular end-diastolic pressure. 3.  Successful very difficult angioplasty and drug-eluting stent placement to the LAD as outlined above. --Recommendations: Given anterior apical scar related to late presentation as well as long segments of stents, there is a high chance that the LAD will close again in the future.  If that happens, there might not be much benefit of attempting another PCI.  Might be best to rely on collaterals from the right coronary artery.  In that situation, PCI of the right coronary artery might be indicated in spite of the vessel being nondominant.  This will be considered before hospital discharge. Recommend uninterrupted dual antiplatelet therapy with Aspirin 81mg  daily and Ticagrelor 90mg  twice daily for a minimum of 12 months (ACS - Class I recommendation).  Prefer long-term treatment beyond 1 year due to stent thrombosis. Please note that the patient's EKG did not meet criteria for a STEMI and this was done as an urgent case due to continued chest pain.   12/19/2017 TTE - Left ventricle: The cavity size was normal. There was mild   concentric hypertrophy. Systolic function was moderately to   severely reduced. The estimated ejection fraction was in the   range of 30% to 35%. Akinesis of the mid-apicalanteroseptal,   anterior, and apical myocardium. Doppler parameters are   consistent with abnormal left ventricular relaxation (grade 1   diastolic dysfunction).  Patient Profile   52 y.o. female with a history of CAD, recent anterior MI, ischemic cardiomyopathy, chronic systolic  CHF, tobacco abuse, DM, HLD and pancreatitis presenting to the ED with c/o chest pain and seen for evaluation of chest pain at the request of Dr. Jacqualine Code  Assessment & Plan    1. CAD with UA - Active CP at rest (and current while lying in bed) with negative troponin x3. - Same CP she's been having over past few weeks. Presented to [facility] ED after pain became more severe. Radiates from chest toward L arm with nausea. - Several recent ACS presentations  12/19/17 with acute anterior MI with LAD occlusion and high grade disease in non-dominant RCA. 3 DES placed in LAD and hospital course complicated by cardiogenic shock. LVEF 30-35% prior to discharge and d/c home on 12/21/17 with LifeVest. Readmitted 12/26/17 with CP, n/v and  elevated troponin - repeat cath showed occlusion of proximal LAD with large thrombus burden. PBA and placement of proximal LAD stent. Anterior wall is felt to have significant scar burden. 01/15/18 CXR with no active cardiopulmonary disease - 12/19/17 ECHO EF 30-35%  - EKG from admission shows sinus, prior septal infarct.  - Telemetry sinus brady / bradycardic episodes with Hrs 56-62 - K+ 4.1 and at goal. - Renal function shows slight bump in Cr from baseline 12/28/17 Cr 0.40  01/15/18 Cr 0.53 and BUN 12/28/17 13  17. Glucose also elevated in 300s. - BP hypotensive and not tolerating addition of BB or ACEi/ARB. Current BP 93/54 -119/67.  - Continue ASA 81mg  PO qd /BriIlinta 90 mg PO BID / Atorvastatin 80mg  po q1800 / Digoxin 0.125 po qd - No objective evidence of ischemia. EKG shows no current injury and troponin negative x3.  - Per Dr. Fletcher Anon [facility] 12/26/17 note, consider PCI of RCA prior to discharge if positive troponin and pain persists. Currently, troponin remains negative despite persistent pain. Patient admits might feel easier knowing / following repeat LHC. - Continue nitro SL 0.4mg  SL q66m x3 PRN for pain. - Continue IV heparin  2. Hypotension - BP currently 95/66 - BP hypotensive and  not tolerating addition of BB or ACEi/ARB. Current BP 93/54 -119/67.  - Continue Midrodrine 2.5mg , po, TID with meals  - Continue IVF given recent bump in Cr and patient Euvolemic on exam  3. DM2 -Per IM  4. Tobacco abuse - Per patient report, quit smoking (visual exam shows nicotine patch) - Per IM  For questions or updates, please contact [place] Please consult [email or web address] for contact info under Cardiology/STEMI.      Dorthula Nettles, PA-C  Pager [phone] 01/16/2018, 9:51 AM

## 2018-01-16 NOTE — Progress Notes (Addendum)
[facility] for Heparin Indication: chest pain/ACS  Allergies  Allergen Reactions  . Bee Venom Itching, Shortness Of Breath and Swelling  . Metformin And Related Shortness Of Breath  . Darvon [Propoxyphene] Itching  . Gabapentin Swelling  . Nsaids Other (See Comments)    Ulcers   . Tramadol Hives    Patient Measurements: Height: 5\' 1"  (154.9 cm) Weight: 133 lb (60.3 kg) IBW/kg (Calculated) : 47.8 Heparin Dosing Weight: 60 kg  Vital Signs: Temp: 97.6 F (36.4 C) (07/29 0809) Temp Source: Oral (07/29 0809) BP: 95/66 (07/29 0809) Pulse Rate: 65 (07/29 0809)  Labs: Recent Labs    01/15/18 1022 01/15/18 1355 01/15/18 1817 01/15/18 2009 01/16/18 0139 01/16/18 0851  HGB 13.8  --   --   --  12.8  --   HCT 40.4  --   --   --  36.7  --   PLT 237  --   --   --  213  --   APTT 27  --   --   --   --   --   LABPROT 12.5  --   --   --   --   --   INR 0.94  --   --   --   --   --   HEPARINUNFRC  --   --  0.13*  --  0.23* 0.36  CREATININE 0.56  --   --   --   --   --   TROPONINI <0.03 <0.03  --  <0.03  --   --     Estimated Creatinine Clearance: 67 mL/min (by C-G formula based on SCr of 0.56 mg/dL).   Medical History: Past Medical History:  Diagnosis Date  . Cancer ([place])    vulvular  . Chronic combined systolic (congestive) and diastolic (congestive) heart failure ([place])    a. 12/2017 Echo: EF 30-35%, mid-apicalanteroseptal, ant, and apical AK. Gr1 DD. Mild conc LVH.  Marland Kitchen COPD (chronic obstructive pulmonary disease) (HCC)    not on home oxygen  . Coronary artery disease    a. 12/2017 ACS/PCI: LM nl, LAD 100p (2.25x26 Resolute Onyx DES), 42m (2.0x12 Resolute Onyx DES), 80d (2.0x15 Resolute Onyx DES), LCX 30p, RCA 90p (non-dominant). EF 25-35%. Post-MI course complicated by cardiogenic shock req vasopressor Rx; b. 12/2017 NSTEMI/subacute thrombosis-->LAD 100 (PTCA + DES x 1).  . Diabetes mellitus without complication ([place])   . GERD  (gastroesophageal reflux disease)   . HLD (hyperlipidemia)   . Ischemic cardiomyopathy    a. 12/2017 Echo: EF 30-35%.  . Myocardial infarction ([place])    a. 12/2017-->DES to LAD x 3.  . Pancreatitis   . Shingles   . Tobacco abuse     Assessment: 52 y/o F admitted with ACS not on anticoagulants PTA.   Goal of Therapy:  Heparin level 0.3-0.7 units/ml Monitor platelets by anticoagulation protocol: Yes   Plan:  7/29 0851 HL 0.36. Level is therapeutic. Will continue heparin gtt at same rate of 1000 units/hr. Recheck HL in 6 hours (1500).   Paticia Stack, PharmD Pharmacy Resident  01/16/2018 11:31 AM

## 2018-01-16 NOTE — Care Management Note (Signed)
Case Management Note  Patient Details  Name: Renee Mack MRN: [id number] Date of Birth: 1965/12/27  Subjective/Objective:                  Admitted to [facility] with the diagnosis of unstable angina. Lives alone. Friend is Arbie Cookey [phone]). Sees Dr. Carolyn Stare at [facility]. Prescription are filled at [facility],  No home Health. No skilled facility. No home oxygen. Life Vest in the home. [facility] and [facility] in the past [place] has Medicaid.  Wants to move her physician services closer to home,   Action/Plan: Wants to go to [facility] or [facility] given   Expected Discharge Date:                  Expected Discharge Plan:     In-House Referral:   yes  Discharge planning Services   yes  Post Acute Care Choice:    Choice offered to:     DME Arranged:    DME Agency:     HH Arranged:    HH Agency:     Status of Service:     If discussed at [employer], dates discussed:    Additional Comments:  Shelbie Ammons, RN MSN CCM Care Management                         [phone] 01/16/2018, 2:14 PM

## 2018-01-16 NOTE — Progress Notes (Signed)
[facility]                                                                                                                                                                                  Patient Demographics   Renee Mack, is a 52 y.o. female, DOB - September 21, 1965, VQQ:[id number]  Admit date - 01/15/2018   Admitting Physician Demetrios Loll, MD  Outpatient Primary MD for the patient is [name], [name], MD   LOS - 1  Subjective: Patient continues to complain of pain in the neck shoulder    Review of Systems:   CONSTITUTIONAL: No documented fever. No fatigue, weakness. No weight gain, no weight loss.  EYES: No blurry or double vision.  ENT: No tinnitus. No postnasal drip. No redness of the oropharynx.  RESPIRATORY: No cough, no wheeze, no hemoptysis. No dyspnea.  CARDIOVASCULAR: No chest pain. No orthopnea. No palpitations. No syncope.  GASTROINTESTINAL: No nausea, no vomiting or diarrhea. No abdominal pain. No melena or hematochezia.  GENITOURINARY: No dysuria or hematuria.  ENDOCRINE: No polyuria or nocturia. No heat or cold intolerance.  HEMATOLOGY: No anemia. No bruising. No bleeding.  INTEGUMENTARY: No rashes. No lesions.   MUSCULOSKELETAL: No arthritis. No swelling. No gout.  Positive pain in the neck and the shoulder NEUROLOGIC: No numbness, tingling, or ataxia. No seizure-type activity.  PSYCHIATRIC: No anxiety. No insomnia. No ADD.    Vitals:   Vitals:   01/16/18 0248 01/16/18 0430 01/16/18 0809 01/16/18 1230  BP: 103/61 (!) 93/54 95/66 (!) 101/59  Pulse:  64 65 62  Resp:  18    Temp:  97.7 F (36.5 C) 97.6 F (36.4 C)   TempSrc:  Oral Oral   SpO2:  100% 99%   Weight:      Height:        Wt Readings from Last 3 Encounters:  01/15/18 60.3 kg (133 lb)  01/10/18 60.6 kg (133 lb 8 oz)  01/05/18 59 kg (130 lb)     Intake/Output Summary (Last 24 hours) at 01/16/2018 1344 Last data filed at 01/16/2018 0000 Gross per 24 hour  Intake  93.22 ml  Output -  Net 93.22 ml    Physical Exam:   GENERAL: Pleasant-appearing in no apparent distress.  HEAD, EYES, EARS, NOSE AND THROAT: Atraumatic, normocephalic. Extraocular muscles are intact. Pupils equal and reactive to light. Sclerae anicteric. No conjunctival injection. No oro-pharyngeal erythema.  NECK: Supple. There is no jugular venous distention. No bruits, no lymphadenopathy, no thyromegaly.  HEART: Regular rate and rhythm,. No murmurs, no rubs, no clicks.  LUNGS: Clear to auscultation bilaterally. No rales or rhonchi. No wheezes.  ABDOMEN:  Soft, flat, nontender, nondistended. Has good bowel sounds. No hepatosplenomegaly appreciated.  EXTREMITIES: No evidence of any cyanosis, clubbing, or peripheral edema.  +2 pedal and radial pulses bilaterally.  NEUROLOGIC: The patient is alert, awake, and oriented x3 with no focal motor or sensory deficits appreciated bilaterally.  SKIN: Moist and warm with no rashes appreciated.  Psych: Not anxious, depressed LN: No inguinal LN enlargement    Antibiotics   Anti-infectives (From admission, onward)   None      Medications   Scheduled Meds: . aspirin EC  81 mg Oral Daily  . atorvastatin  80 mg Oral q1800  . citalopram  20 mg Oral Daily  . digoxin  0.125 mg Oral Daily  . insulin aspart  0-5 Units Subcutaneous QHS  . insulin aspart  0-9 Units Subcutaneous TID WC  . insulin aspart  10 Units Subcutaneous TID AC  . insulin glargine  60 Units Subcutaneous QHS  . LORazepam  1 mg Oral Q8H  . midodrine  2.5 mg Oral TID WC  . nicotine  14 mg Transdermal Q24H  . ticagrelor  90 mg Oral BID   Continuous Infusions: . heparin 1,000 Units/hr (01/16/18 0831)   PRN Meds:.acetaminophen, albuterol, ALPRAZolam, morphine injection, nitroGLYCERIN, ondansetron (ZOFRAN) IV, oxyCODONE-acetaminophen, zolpidem   Data Review:   Micro Results No results found for this or any previous visit (from the past 240 hour(s)).  Radiology Reports Ct  Abdomen Pelvis W Contrast  Result Date: 01/05/2018 CLINICAL DATA:  Bilateral lower quadrant abdomen pain. EXAM: CT ABDOMEN AND PELVIS WITH CONTRAST TECHNIQUE: Multidetector CT imaging of the abdomen and pelvis was performed using the standard protocol following bolus administration of intravenous contrast. CONTRAST:  111mL OMNIPAQUE IOHEXOL 300 MG/ML  SOLN COMPARISON:  Oct 27, 2017 FINDINGS: Lower chest: No acute abnormality. Hepatobiliary: There is mild diffuse low density of the liver without vessel displacement. No focal liver lesion is identified. The gallbladder is normal. There is dilatation of the common bowel duct measuring 8.5 mm unchanged compared prior CT. Pancreas: Unremarkable. No pancreatic ductal dilatation or surrounding inflammatory changes. Spleen: Normal in size without focal abnormality. Adrenals/Urinary Tract: The adrenal glands are normal. There is focal scar of the upper pole left kidney. No renal lesion is identified. There is no hydronephrosis bilaterally. The bladder is partially decompressed without focal abnormality. Stomach/Bowel: There is a small hiatal hernia. The stomach is otherwise normal. There is no small bowel obstruction or diverticulitis. The appendix is normal. There is focal anterior herniation of the transverse colon without evidence of incarceration unchanged compared to prior CT. Vascular/Lymphatic: Aortic atherosclerosis. No enlarged abdominal or pelvic lymph nodes. Reproductive: Status post hysterectomy. No adnexal masses. Other: Umbilical herniation of mesenteric fat is identified. Musculoskeletal: No acute or significant osseous findings. IMPRESSION: No bowel obstruction. No evidence of diverticulitis. Focal herniation of transverse colon in the midline anterior abdomen without evidence of incarceration, unchanged compared prior CT. Fatty infiltration of liver. Focal scar of left kidney. Electronically Signed   By: Abelardo Diesel M.D.   On: 01/05/2018 16:32   Dg  Chest Port 1 View  Result Date: 01/15/2018 CLINICAL DATA:  Chest pain EXAM: PORTABLE CHEST 1 VIEW COMPARISON:  07/17/2017 chest radiograph. FINDINGS: Stable cardiomediastinal silhouette with normal heart size. No pneumothorax. No pleural effusion. Lungs appear clear, with no acute consolidative airspace disease and no pulmonary edema. IMPRESSION: No active disease. Electronically Signed   By: Ilona Sorrel M.D.   On: 01/15/2018 10:50     CBC Recent Labs  Lab 01/15/18 1022 01/16/18 0139  WBC 9.0 8.8  HGB 13.8 12.8  HCT 40.4 36.7  PLT 237 213  MCV 94.0 92.7  MCH 32.2 32.2  MCHC 34.2 34.7  RDW 14.1 13.8    Chemistries  Recent Labs  Lab 01/15/18 1022  NA 135  K 4.1  CL 103  CO2 25  GLUCOSE 391*  BUN 17  CREATININE 0.56  CALCIUM 8.9   ------------------------------------------------------------------------------------------------------------------ estimated creatinine clearance is 67 mL/min (by C-G formula based on SCr of 0.56 mg/dL). ------------------------------------------------------------------------------------------------------------------ No results for input(s): HGBA1C in the last 72 hours. ------------------------------------------------------------------------------------------------------------------ No results for input(s): CHOL, HDL, LDLCALC, TRIG, CHOLHDL, LDLDIRECT in the last 72 hours. ------------------------------------------------------------------------------------------------------------------ No results for input(s): TSH, T4TOTAL, T3FREE, THYROIDAB in the last 72 hours.  Invalid input(s): FREET3 ------------------------------------------------------------------------------------------------------------------ No results for input(s): VITAMINB12, FOLATE, FERRITIN, TIBC, IRON, RETICCTPCT in the last 72 hours.  Coagulation profile Recent Labs  Lab 01/15/18 1022  INR 0.94    No results for input(s): DDIMER in the last 72 hours.  Cardiac  Enzymes Recent Labs  Lab 01/15/18 1022 01/15/18 1355 01/15/18 2009  TROPONINI <0.03 <0.03 <0.03   ------------------------------------------------------------------------------------------------------------------ Invalid input(s): POCBNP    Assessment & Plan  Patient 52 year old with history of recent stent placed to cardiac catheterizations this month presenting back with chest pain   1.CAD with unstable angina. Continue heparin drip, aspirin, Brilinta and Lipitor,  Patient's troponins have been negative however patient continues to be symptomatic I have discussed with Dr. Rockey Situ they will be assessing the patient.  I reviewed the physician's assistant's note it is unclear whether there will be a cath today or not.  I have message Dr. Rockey Situ to see what the recommendation will be  2. hypertension.  Unable to use antihypertensive due to low blood pressure which was a issue prior to this admission continue midrodrine  3. Diabetes.  Continue   Lantus, pre-meal insulin and sliding scale insulin  4. COPD.  Stable.  Nebulizer as needed.  5. Tobacco abuse.  Smoking cessation was counseled        Code Status Orders  (From admission, onward)        Start     Ordered   01/15/18 1342  Full code  Continuous     01/15/18 1342    Code Status History    Date Active Date Inactive Code Status Order ID Comments User Context   12/26/2017 1538 12/30/2017 1928 Full Code [id number]  Wellington Hampshire, MD Inpatient   12/26/2017 1537 12/26/2017 1538 Full Code [id number]  Vaughan Basta, MD Inpatient   12/19/2017 0839 12/21/2017 1940 Full Code [id number]  Wellington Hampshire, MD Inpatient   11/03/2017 1031 11/04/2017 1850 Full Code [id number]  Fritzi Mandes, MD Inpatient   09/28/2017 1044 09/29/2017 2121 Full Code [id number]  Harrie Foreman, MD ED   07/17/2017 2004 07/20/2017 1840 Full Code [id number]  Henreitta Leber, MD ED   06/12/2017 2227 06/16/2017 1910 Full Code [id number]  Salary, Avel Peace, MD  Inpatient   12/28/2014 0305 12/28/2014 1919 Full Code [id number]  Lance Coon, MD Inpatient           Consults cardiology  DVT Prophylaxis heparin  Lab Results  Component Value Date   PLT 213 01/16/2018     Time Spent in minutes   35 minutes greater than 50% of time spent in care coordination and counseling patient regarding the condition and plan of care.   Dustin Flock M.D on 01/16/2018 at 1:44 PM  Between 7am to 6pm - Pager - [phone]  After 6pm go to [email or web address] - Proofreader  [employer]   Office  [phone]

## 2018-01-16 NOTE — Progress Notes (Signed)
Pt complaining of pain to neck.  Rquesting IV Morphine.  B/P noted and rechecked.  Pt made aware and upset and cursing at staff when  made aware that low blood pressure prevents giving Morphine dose. Declines flexeril. Dr Tressia Miners made aware.  Ordered to given po Percocet early.

## 2018-01-16 NOTE — Progress Notes (Signed)
[facility] for Heparin Indication: chest pain/ACS  Allergies  Allergen Reactions  . Bee Venom Itching, Shortness Of Breath and Swelling  . Metformin And Related Shortness Of Breath  . Darvon [Propoxyphene] Itching  . Gabapentin Swelling  . Nsaids Other (See Comments)    Ulcers   . Tramadol Hives    Patient Measurements: Height: 5\' 1"  (154.9 cm) Weight: 133 lb (60.3 kg) IBW/kg (Calculated) : 47.8 Heparin Dosing Weight: 60 kg  Vital Signs: Temp: 97.8 F (36.6 C) (07/28 2027) Temp Source: Oral (07/28 2027) BP: 103/61 (07/29 0248) Pulse Rate: 65 (07/29 0201)  Labs: Recent Labs    01/15/18 1022 01/15/18 1355 01/15/18 1817 01/15/18 2009 01/16/18 0139  HGB 13.8  --   --   --  12.8  HCT 40.4  --   --   --  36.7  PLT 237  --   --   --  213  APTT 27  --   --   --   --   LABPROT 12.5  --   --   --   --   INR 0.94  --   --   --   --   HEPARINUNFRC  --   --  0.13*  --  0.23*  CREATININE 0.56  --   --   --   --   TROPONINI <0.03 <0.03  --  <0.03  --     Estimated Creatinine Clearance: 67 mL/min (by C-G formula based on SCr of 0.56 mg/dL).   Medical History: Past Medical History:  Diagnosis Date  . Cancer ([place])    vulvular  . Chronic combined systolic (congestive) and diastolic (congestive) heart failure ([place])    a. 12/2017 Echo: EF 30-35%, mid-apicalanteroseptal, ant, and apical AK. Gr1 DD. Mild conc LVH.  Marland Kitchen COPD (chronic obstructive pulmonary disease) ([facility])    not on home oxygen  . Coronary artery disease    a. 12/2017 ACS/PCI: LM nl, LAD 100p (2.25x26 Resolute Onyx DES), 57m (2.0x12 Resolute Onyx DES), 80d (2.0x15 Resolute Onyx DES), LCX 30p, RCA 90p (non-dominant). EF 25-35%. Post-MI course complicated by cardiogenic shock req vasopressor Rx; b. 12/2017 NSTEMI/subacute thrombosis-->LAD 100 (PTCA + DES x 1).  . Diabetes mellitus without complication ([place])   . GERD (gastroesophageal reflux disease)   . HLD (hyperlipidemia)   .  Ischemic cardiomyopathy    a. 12/2017 Echo: EF 30-35%.  . Myocardial infarction ([place])    a. 12/2017-->DES to LAD x 3.  . Pancreatitis   . Shingles   . Tobacco abuse     Assessment: 52 y/o F admitted with ACS not on anticoagulants PTA.   Goal of Therapy:  Heparin level 0.3-0.7 units/ml Monitor platelets by anticoagulation protocol: Yes   Plan:  7/28 1846 HL 0.13. Level is subtherapeutic. Will order heparin 1800unit bolus and increase infusion rate to 900 units/hr. Recheck HL in 6 hours.   7/29 0130 heparin level 0.23. 900 unit bolus and increase rate to 1000 units.hr. Recheck in 6 hours.  Eloise Harman, PharmD, BCPS Clinical Pharmacist 01/16/2018 3:14 AM

## 2018-01-17 DIAGNOSIS — I2510 Atherosclerotic heart disease of native coronary artery without angina pectoris: Secondary | ICD-10-CM

## 2018-01-17 DIAGNOSIS — M542 Cervicalgia: Principal | ICD-10-CM

## 2018-01-17 DIAGNOSIS — I255 Ischemic cardiomyopathy: Secondary | ICD-10-CM

## 2018-01-17 DIAGNOSIS — E785 Hyperlipidemia, unspecified: Secondary | ICD-10-CM

## 2018-01-17 DIAGNOSIS — I5022 Chronic systolic (congestive) heart failure: Secondary | ICD-10-CM

## 2018-01-17 LAB — CBC
HCT: 39.9 % (ref 35.0–47.0)
HEMOGLOBIN: 13.6 g/dL (ref 12.0–16.0)
MCH: 32.1 pg (ref 26.0–34.0)
MCHC: 34.1 g/dL (ref 32.0–36.0)
MCV: 94.3 fL (ref 80.0–100.0)
Platelets: 227 10*3/uL (ref 150–440)
RBC: 4.24 MIL/uL (ref 3.80–5.20)
RDW: 14.2 % (ref 11.5–14.5)
WBC: 7.7 10*3/uL (ref 3.6–11.0)

## 2018-01-17 LAB — GLUCOSE, CAPILLARY
GLUCOSE-CAPILLARY: 170 mg/dL — AB (ref 70–99)
GLUCOSE-CAPILLARY: 128 mg/dL — AB (ref 70–99)

## 2018-01-17 LAB — BASIC METABOLIC PANEL
ANION GAP: 8 (ref 5–15)
BUN: 19 mg/dL (ref 6–20)
CO2: 25 mmol/L (ref 22–32)
Calcium: 8.8 mg/dL — ABNORMAL LOW (ref 8.9–10.3)
Chloride: 104 mmol/L (ref 98–111)
Creatinine, Ser: 0.46 mg/dL (ref 0.44–1.00)
GFR calc Af Amer: >60 mL/min (ref >60)
Glucose, Bld: 191 mg/dL — ABNORMAL HIGH (ref 70–99)
POTASSIUM: 4.3 mmol/L (ref 3.5–5.1)
SODIUM: 137 mmol/L (ref 135–145)

## 2018-01-17 MED ORDER — OXYCODONE-ACETAMINOPHEN 5-325 MG PO TABS: 1.0000 | ORAL_TABLET | Freq: Four times a day (QID) | ORAL | 0 refills | Status: AC | PRN

## 2018-01-17 MED ORDER — CYCLOBENZAPRINE HCL 5 MG PO TABS: 5.0000 mg | ORAL_TABLET | Freq: Three times a day (TID) | ORAL | 0 refills | Status: DC

## 2018-01-17 MED ORDER — CYCLOBENZAPRINE HCL 10 MG PO TABS: 10.0000 mg | ORAL_TABLET | Freq: Three times a day (TID) | ORAL | 0 refills | Status: DC | PRN

## 2018-01-17 NOTE — Progress Notes (Signed)
Progress Note  Patient Name: Renee Mack Date of Encounter: 01/17/2018  Primary Cardiologist: [name], MD   Subjective   Patient still complaining of intermittent neck and left shoulder pain radiating to the upper chest and left arm.  It is positional.  No shortness of breath.  Inpatient Medications    Scheduled Meds: . aspirin EC  81 mg Oral Daily  . atorvastatin  80 mg Oral q1800  . citalopram  20 mg Oral Daily  . cyclobenzaprine  5 mg Oral TID  . digoxin  0.125 mg Oral Daily  . insulin aspart  0-5 Units Subcutaneous QHS  . insulin aspart  0-9 Units Subcutaneous TID WC  . insulin aspart  10 Units Subcutaneous TID AC  . insulin glargine  60 Units Subcutaneous QHS  . LORazepam  1 mg Oral Q8H  . midodrine  2.5 mg Oral TID WC  . nicotine  14 mg Transdermal Q24H  . sodium chloride flush  3 mL Intravenous Q12H  . ticagrelor  90 mg Oral BID   Continuous Infusions:  PRN Meds: acetaminophen, albuterol, ALPRAZolam, morphine injection, nitroGLYCERIN, ondansetron (ZOFRAN) IV, oxyCODONE-acetaminophen, sodium chloride flush, zolpidem   Vital Signs    Vitals:   01/17/18 0537 01/17/18 0539 01/17/18 0733 01/17/18 0949  BP: (!) 94/59 100/62 (!) 94/57 105/64  Pulse: 68 71 66 74  Resp:      Temp:   98 F (36.7 C)   TempSrc:   Oral   SpO2:   95%   Weight:      Height:        Intake/Output Summary (Last 24 hours) at 01/17/2018 1032 Last data filed at 01/17/2018 0951 Gross per 24 hour  Intake 480 ml  Output -  Net 480 ml   Filed Weights   01/15/18 1007  Weight: 133 lb (60.3 kg)    Telemetry    Normal sinus rhythm - Personally Reviewed  ECG    No new tracing  Physical Exam   GEN:  Appears in pain with slight movements Neck: No JVD Cardiac: RRR, no murmurs, rubs, or gallops.  Respiratory: Clear to auscultation bilaterally. GI: Soft, nontender, non-distended  MS: No edema; No deformity.  Tenderness to palpation along the posterior neck and left posterior  shoulder. Neuro:  Nonfocal  Psych: Normal affect   Labs    Chemistry Recent Labs  Lab 01/15/18 1022  NA 135  K 4.1  CL 103  CO2 25  GLUCOSE 391*  BUN 17  CREATININE 0.56  CALCIUM 8.9  GFRNONAA >60  GFRAA >60  ANIONGAP 7     Hematology Recent Labs  Lab 01/15/18 1022 01/16/18 0139 01/17/18 0505  WBC 9.0 8.8 7.7  RBC 4.30 3.96 4.24  HGB 13.8 12.8 13.6  HCT 40.4 36.7 39.9  MCV 94.0 92.7 94.3  MCH 32.2 32.2 32.1  MCHC 34.2 34.7 34.1  RDW 14.1 13.8 14.2  PLT 237 213 227    Cardiac Enzymes Recent Labs  Lab 01/15/18 1022 01/15/18 1355 01/15/18 2009  TROPONINI <0.03 <0.03 <0.03   No results for input(s): TROPIPOC in the last 168 hours.   BNPNo results for input(s): BNP, PROBNP in the last 168 hours.   DDimer No results for input(s): DDIMER in the last 168 hours.   Radiology    Dg Chest Port 1 View  Result Date: 01/15/2018 CLINICAL DATA:  Chest pain EXAM: PORTABLE CHEST 1 VIEW COMPARISON:  07/17/2017 chest radiograph. FINDINGS: Stable cardiomediastinal silhouette with normal heart size. No  pneumothorax. No pleural effusion. Lungs appear clear, with no acute consolidative airspace disease and no pulmonary edema. IMPRESSION: No active disease. Electronically Signed   By: Ilona Sorrel M.D.   On: 01/15/2018 10:50    Cardiac Studies   12/26/2017  LHC 1. Subacute stent thrombosis in the LAD likely due to poor outflow related to previous late presentation of anterior myocardial infarction as well as long segments of stents. Collaterals are noted from the right coronary artery to the LAD. 2. Moderately elevated left ventricular Jettie Mannor-diastolic pressure. 3. Successful very difficult angioplasty and drug-eluting stent placement to the LAD as outlined above. --Recommendations: Given anterior apical scar related to late presentation as well as long segments of stents, there is a high chance that the LAD will close again in the future. If that happens, there might not be  much benefit of attempting another PCI. Might be best to rely on collaterals from the right coronary artery. In that situation, PCI of the right coronary artery might be indicated in spite of the vessel being nondominant. This will be considered before hospital discharge. Recommend uninterrupted dual antiplatelet therapy with Aspirin 81mg  daily and Ticagrelor 90mg  twice dailyfor a minimum of 12 months (ACS - Class I recommendation).Prefer long-term treatment beyond 1 year due to stent thrombosis. Please note that the patient's EKG did not meet criteria for a STEMI and this was done as an urgent case due to continued chest pain.   12/19/2017 TTE - Left ventricle: The cavity size was normal. There was mild concentric hypertrophy. Systolic function was moderately to severely reduced. The estimated ejection fraction was in the range of 30% to 35%. Akinesis of the mid-apicalanteroseptal, anterior, and apical myocardium. Doppler parameters are consistent with abnormal left ventricular relaxation (grade 1 diastolic dysfunction).  Patient Profile     52 y.o. female CAD, recent anterior MI, ischemic cardiomyopathy, chronic systolic CHF, tobacco abuse, DM, HLD and pancreatitis, admitted with atypical chest, neck, and upper back pain.  Assessment & Plan    Neck/upper back pain Symptoms persist and are atypical.  I am more concerned for a musculoskeletal etiology.  Troponin is negative.  Recommend conservative therapy with analgesia and physical therapy.  Defer further work-up to internal medicine.  Coronary artery disease with recent STEMI Pain is atypical.  Given negative troponin and recent PCI x2 to the LAD, I do not recommend any further cardiac work-up at this time.  Continue aspirin and ticagrelor.  Aggressive secondary prevention, including high intensity statin therapy.  Continue  Chronic systolic heart failure due to ischemic cardiomyopathy Ms. Behnke appears  euvolemic.  She is not on any evidence-based heart failure therapy due to soft blood pressure and low normal heart rate.  She is on digoxin.  Check BMP today.  If renal function and potassium allow, start spironolactone 12.5 mg daily.  Check digoxin level tomorrow morning.  If blood pressure tolerates, consider adding low-dose beta-blocker or ACE inhibitor prior to discharge.  Continue LifeVest after discharge.  Hyperlipidemia Goal LDL less than 70.  Continue atorvastatin 80 mg daily.  For questions or updates, please contact Yoe Please consult [email or web address] for contact info under [facility] Cardiology.   Signed, Nelva Bush, MD  01/17/2018, 10:32 AM

## 2018-01-17 NOTE — Plan of Care (Signed)

## 2018-01-17 NOTE — Discharge Summary (Signed)
[place] at [facility], 52 y.o., DOB 03/24/1966, MRN [id number]. Admission date: 01/15/2018 Discharge Date 01/17/2018 Primary MD Ro, [name], MD Admitting Physician Demetrios Loll, MD  Admission Diagnosis  Unstable angina [facility]) [I20.0]  Discharge Diagnosis   Active Problems: Neck pain due to musculoskeletal pain no evidence of angina Coronary artery disease with recent stent Essential hypertension Diabetes type 2 COPD Tobacco abuse  Hospital Course Patient 52 year old with history of recent stent placed to cardiac catheterizations this month presenting back with neck pain and shoulder pain.  Patient has had recent stents.  Her cardiac enzymes were negative.  EKG was nonrevealing.  She was seen by cardiology they did not tell that this was related to her heart.  It was musculoskeletal in nature.    Due to recent low blood pressure were not use able to use beta-blockers and ACE inhibitors           Consults  cardiology  Significant Tests:  See full reports for all details    Ct Abdomen Pelvis W Contrast  Result Date: 01/05/2018 CLINICAL DATA:  Bilateral lower quadrant abdomen pain. EXAM: CT ABDOMEN AND PELVIS WITH CONTRAST TECHNIQUE: Multidetector CT imaging of the abdomen and pelvis was performed using the standard protocol following bolus administration of intravenous contrast. CONTRAST:  143mL OMNIPAQUE IOHEXOL 300 MG/ML  SOLN COMPARISON:  Oct 27, 2017 FINDINGS: Lower chest: No acute abnormality. Hepatobiliary: There is mild diffuse low density of the liver without vessel displacement. No focal liver lesion is identified. The gallbladder is normal. There is dilatation of the common bowel duct measuring 8.5 mm unchanged compared prior CT. Pancreas: Unremarkable. No pancreatic ductal dilatation or surrounding inflammatory changes. Spleen: Normal in size without focal abnormality. Adrenals/Urinary Tract: The adrenal glands are normal. There is  focal scar of the upper pole left kidney. No renal lesion is identified. There is no hydronephrosis bilaterally. The bladder is partially decompressed without focal abnormality. Stomach/Bowel: There is a small hiatal hernia. The stomach is otherwise normal. There is no small bowel obstruction or diverticulitis. The appendix is normal. There is focal anterior herniation of the transverse colon without evidence of incarceration unchanged compared to prior CT. Vascular/Lymphatic: Aortic atherosclerosis. No enlarged abdominal or pelvic lymph nodes. Reproductive: Status post hysterectomy. No adnexal masses. Other: Umbilical herniation of mesenteric fat is identified. Musculoskeletal: No acute or significant osseous findings. IMPRESSION: No bowel obstruction. No evidence of diverticulitis. Focal herniation of transverse colon in the midline anterior abdomen without evidence of incarceration, unchanged compared prior CT. Fatty infiltration of liver. Focal scar of left kidney. Electronically Signed   By: Abelardo Diesel M.D.   On: 01/05/2018 16:32   Dg Chest Port 1 View  Result Date: 01/15/2018 CLINICAL DATA:  Chest pain EXAM: PORTABLE CHEST 1 VIEW COMPARISON:  07/17/2017 chest radiograph. FINDINGS: Stable cardiomediastinal silhouette with normal heart size. No pneumothorax. No pleural effusion. Lungs appear clear, with no acute consolidative airspace disease and no pulmonary edema. IMPRESSION: No active disease. Electronically Signed   By: Ilona Sorrel M.D.   On: 01/15/2018 10:50       Today   Subjective:   Renee Mack patient has continued neck pain but improved. Objective:   Blood pressure 105/64, pulse 74, temperature 98 F (36.7 C), temperature source Oral, resp. rate 19, height 5\' 1"  (1.549 m), weight 60.3 kg (133 lb), SpO2 95 %.  .  Intake/Output Summary (Last 24 hours) at 01/17/2018 1401 Last data filed at 01/17/2018 [phone]  Gross per 24 hour  Intake 480 ml  Output -  Net 480 ml    Exam VITAL  SIGNS: Blood pressure 105/64, pulse 74, temperature 98 F (36.7 C), temperature source Oral, resp. rate 19, height 5\' 1"  (1.549 m), weight 60.3 kg (133 lb), SpO2 95 %.  GENERAL:  52 y.o.-year-old patient lying in the bed with no acute distress.  EYES: Pupils equal, round, reactive to light and accommodation. No scleral icterus. Extraocular muscles intact.  HEENT: Head atraumatic, normocephalic. Oropharynx and nasopharynx clear.  NECK:  Supple, no jugular venous distention. No thyroid enlargement, no tenderness.  LUNGS: Normal breath sounds bilaterally, no wheezing, rales,rhonchi or crepitation. No use of accessory muscles of respiration.  CARDIOVASCULAR: S1, S2 normal. No murmurs, rubs, or gallops.  ABDOMEN: Soft, nontender, nondistended. Bowel sounds present. No organomegaly or mass.  EXTREMITIES: No pedal edema, cyanosis, or clubbing.  NEUROLOGIC: Cranial nerves II through XII are intact. Muscle strength 5/5 in all extremities. Sensation intact. Gait not checked.  PSYCHIATRIC: The patient is alert and oriented x 3.  SKIN: No obvious rash, lesion, or ulcer.   Data Review     CBC w Diff:  Lab Results  Component Value Date   WBC 7.7 01/17/2018   HGB 13.6 01/17/2018   HGB 16.1 (H) 04/19/2014   HCT 39.9 01/17/2018   HCT 48.7 (H) 04/19/2014   PLT 227 01/17/2018   PLT 318 04/19/2014   LYMPHOPCT 42 12/12/2017   MONOPCT 4 12/12/2017   EOSPCT 3 12/12/2017   BASOPCT 1 12/12/2017   CMP:  Lab Results  Component Value Date   NA 137 01/17/2018   NA 129 (L) 04/19/2014   K 4.3 01/17/2018   K 4.2 04/19/2014   CL 104 01/17/2018   CL 100 04/19/2014   CO2 25 01/17/2018   CO2 19 (L) 04/19/2014   BUN 19 01/17/2018   BUN 9 04/19/2014   CREATININE 0.46 01/17/2018   CREATININE 0.71 04/19/2014   PROT 8.6 (H) 01/05/2018   PROT 8.6 (H) 04/19/2014   ALBUMIN 4.3 01/05/2018   ALBUMIN 3.8 04/19/2014   BILITOT 0.6 01/05/2018   BILITOT 0.5 04/19/2014   ALKPHOS 140 (H) 01/05/2018   ALKPHOS 91  04/19/2014   AST 16 01/05/2018   AST 22 04/19/2014   ALT 27 01/05/2018   ALT 19 04/19/2014  .  Micro Results No results found for this or any previous visit (from the past 240 hour(s)).      Code Status Orders  (From admission, onward)        Start     Ordered   01/15/18 1342  Full code  Continuous     01/15/18 1342    Code Status History    Date Active Date Inactive Code Status Order ID Comments User Context   12/26/2017 1538 12/30/2017 1928 Full Code [id number]  Wellington Hampshire, MD Inpatient   12/26/2017 1537 12/26/2017 1538 Full Code [id number]  Vaughan Basta, MD Inpatient   12/19/2017 0839 12/21/2017 1940 Full Code [id number]  Wellington Hampshire, MD Inpatient   11/03/2017 1031 11/04/2017 1850 Full Code [id number]  Fritzi Mandes, MD Inpatient   09/28/2017 1044 09/29/2017 2121 Full Code [id number]  Harrie Foreman, MD ED   07/17/2017 2004 07/20/2017 1840 Full Code [id number]  Henreitta Leber, MD ED   06/12/2017 2227 06/16/2017 1910 Full Code [id number]  Salary, Avel Peace, MD Inpatient   12/28/2014 0305 12/28/2014 1919 Full Code [id number]  Lance Coon, MD Inpatient  Follow-up Information    Ro, [name], MD In 6 days.   Specialty:  Family Medicine Contact information: [address] [id number] [phone]           Discharge Medications   Allergies as of 01/17/2018      Reactions   Bee Venom Itching, Shortness Of Breath, Swelling   Metformin And Related Shortness Of Breath   Darvon [propoxyphene] Itching   Gabapentin Swelling   Nsaids Other (See Comments)   Ulcers   Tramadol Hives      Medication List    STOP taking these medications   cephALEXin 500 MG capsule Commonly known as:  KEFLEX     TAKE these medications   acetaminophen 325 MG tablet Commonly known as:  TYLENOL Take 650 mg by mouth every 6 (six) hours as needed for mild pain.   aspirin 81 MG tablet Take 81 mg by mouth daily.   atorvastatin 80 MG tablet Commonly  known as:  LIPITOR Take 1 tablet (80 mg total) by mouth daily at 6 PM.   citalopram 20 MG tablet Commonly known as:  CELEXA Take 20 mg daily by mouth.   cyclobenzaprine 10 MG tablet Commonly known as:  FLEXERIL Take 1 tablet (10 mg total) by mouth 3 (three) times daily as needed for muscle spasms.   digoxin 0.125 MG tablet Commonly known as:  LANOXIN Take 1 tablet (0.125 mg total) by mouth daily.   insulin aspart 100 UNIT/ML injection Commonly known as:  novoLOG Inject 10 Units into the skin 3 (three) times daily before meals. Take 10 units PLUS sliding scale   insulin glargine 100 UNIT/ML injection Commonly known as:  LANTUS Inject 60 Units into the skin at bedtime.   LORazepam 1 MG tablet Commonly known as:  ATIVAN Take 1 mg by mouth every 8 (eight) hours.   midodrine 2.5 MG tablet Commonly known as:  PROAMATINE Take 1 tablet (2.5 mg total) by mouth 3 (three) times daily with meals.   nicotine 14 mg/24hr patch Commonly known as:  NICODERM CQ - dosed in mg/24 hours Place 1 patch (14 mg total) onto the skin daily. Okay to substitute generic patch   ondansetron 4 MG tablet Commonly known as:  ZOFRAN Take 1 tablet (4 mg total) by mouth daily as needed.   oxyCODONE-acetaminophen 5-325 MG tablet Commonly known as:  PERCOCET/ROXICET Take 1 tablet by mouth every 6 (six) hours as needed for up to 7 days for moderate pain.   PROAIR HFA 108 (90 Base) MCG/ACT inhaler Generic drug:  albuterol Inhale 1 puff into the lungs every 6 (six) hours as needed for wheezing or shortness of breath.   ticagrelor 90 MG Tabs tablet Commonly known as:  BRILINTA Take 1 tablet (90 mg total) by mouth 2 (two) times daily.          Total Time in preparing paper work, data evaluation and todays exam - 27 minutes  Dustin Flock M.D on 01/17/2018 at 2:01 PM [place]  [phone]

## 2018-01-17 NOTE — Care Management (Signed)
No discharge needs identified by members of the care team 

## 2018-01-17 NOTE — Progress Notes (Signed)
Patient is discharge home via wheel chair in a stable condition, summary and f/u care given , verbalized understanding.

## 2018-01-18 ENCOUNTER — Telehealth: Payer: Self-pay | Admitting: Nurse Practitioner

## 2018-01-18 NOTE — Telephone Encounter (Signed)
TCM....  Patient is being discharged   They saw Dr End   They are scheduled to see Ignacia Bayley on 01/31/18   They were seen for Intermittent CP   They need to be seen within 7-10 day   Pt is on  wait list needs sooner appointment   Please call

## 2018-01-19 NOTE — Telephone Encounter (Signed)
No answer. Left message to call back.   

## 2018-01-23 NOTE — Telephone Encounter (Signed)
No answer. Left message to call back.   

## 2018-01-25 NOTE — Telephone Encounter (Signed)
No answer. Left message to call back.  Third TCM attempt unsuccessful. Closing encounter.

## 2018-01-26 ENCOUNTER — Observation Stay
Admission: EM | Admit: 2018-01-26 | Discharge: 2018-01-28 | Disposition: A | Discharge status: Home / Self Care | Payer: Medicaid Other | Attending: Internal Medicine | Admitting: Internal Medicine

## 2018-01-26 ENCOUNTER — Emergency Department: Payer: Medicaid Other

## 2018-01-26 ENCOUNTER — Observation Stay: Payer: Medicaid Other

## 2018-01-26 ENCOUNTER — Other Ambulatory Visit: Payer: Self-pay

## 2018-01-26 DIAGNOSIS — Z9081 Acquired absence of spleen: Secondary | ICD-10-CM | POA: Insufficient documentation

## 2018-01-26 DIAGNOSIS — R0789 Other chest pain: Secondary | ICD-10-CM | POA: Diagnosis not present

## 2018-01-26 DIAGNOSIS — E119 Type 2 diabetes mellitus without complications: Secondary | ICD-10-CM | POA: Diagnosis not present

## 2018-01-26 DIAGNOSIS — I255 Ischemic cardiomyopathy: Secondary | ICD-10-CM | POA: Insufficient documentation

## 2018-01-26 DIAGNOSIS — Z7902 Long term (current) use of antithrombotics/antiplatelets: Secondary | ICD-10-CM | POA: Insufficient documentation

## 2018-01-26 DIAGNOSIS — R079 Chest pain, unspecified: Secondary | ICD-10-CM | POA: Diagnosis present

## 2018-01-26 DIAGNOSIS — F329 Major depressive disorder, single episode, unspecified: Secondary | ICD-10-CM | POA: Insufficient documentation

## 2018-01-26 DIAGNOSIS — M5412 Radiculopathy, cervical region: Secondary | ICD-10-CM | POA: Insufficient documentation

## 2018-01-26 DIAGNOSIS — I5042 Chronic combined systolic (congestive) and diastolic (congestive) heart failure: Secondary | ICD-10-CM | POA: Insufficient documentation

## 2018-01-26 DIAGNOSIS — I2510 Atherosclerotic heart disease of native coronary artery without angina pectoris: Secondary | ICD-10-CM | POA: Insufficient documentation

## 2018-01-26 DIAGNOSIS — Z955 Presence of coronary angioplasty implant and graft: Secondary | ICD-10-CM | POA: Insufficient documentation

## 2018-01-26 DIAGNOSIS — E785 Hyperlipidemia, unspecified: Secondary | ICD-10-CM | POA: Diagnosis not present

## 2018-01-26 DIAGNOSIS — Z8544 Personal history of malignant neoplasm of other female genital organs: Secondary | ICD-10-CM | POA: Diagnosis not present

## 2018-01-26 DIAGNOSIS — Z87891 Personal history of nicotine dependence: Secondary | ICD-10-CM | POA: Insufficient documentation

## 2018-01-26 DIAGNOSIS — Z794 Long term (current) use of insulin: Secondary | ICD-10-CM | POA: Insufficient documentation

## 2018-01-26 DIAGNOSIS — J449 Chronic obstructive pulmonary disease, unspecified: Secondary | ICD-10-CM | POA: Insufficient documentation

## 2018-01-26 DIAGNOSIS — I5022 Chronic systolic (congestive) heart failure: Secondary | ICD-10-CM

## 2018-01-26 DIAGNOSIS — Z7982 Long term (current) use of aspirin: Secondary | ICD-10-CM | POA: Diagnosis not present

## 2018-01-26 DIAGNOSIS — Z79899 Other long term (current) drug therapy: Secondary | ICD-10-CM | POA: Insufficient documentation

## 2018-01-26 DIAGNOSIS — I252 Old myocardial infarction: Secondary | ICD-10-CM | POA: Diagnosis not present

## 2018-01-26 DIAGNOSIS — I951 Orthostatic hypotension: Secondary | ICD-10-CM | POA: Insufficient documentation

## 2018-01-26 DIAGNOSIS — I200 Unstable angina: Secondary | ICD-10-CM

## 2018-01-26 LAB — CBC
HEMATOCRIT: 41.1 % (ref 35.0–47.0)
Hemoglobin: 14.2 g/dL (ref 12.0–16.0)
MCH: 32.6 pg (ref 26.0–34.0)
MCHC: 34.5 g/dL (ref 32.0–36.0)
MCV: 94.7 fL (ref 80.0–100.0)
Platelets: 251 10*3/uL (ref 150–440)
RBC: 4.34 MIL/uL (ref 3.80–5.20)
RDW: 14.4 % (ref 11.5–14.5)
WBC: 10.1 10*3/uL (ref 3.6–11.0)

## 2018-01-26 LAB — GLUCOSE, CAPILLARY
Glucose-Capillary: 416 mg/dL — ABNORMAL HIGH (ref 70–99)
Glucose-Capillary: 394 mg/dL — ABNORMAL HIGH (ref 70–99)
Glucose-Capillary: 377 mg/dL — ABNORMAL HIGH (ref 70–99)
Glucose-Capillary: 297 mg/dL — ABNORMAL HIGH (ref 70–99)

## 2018-01-26 LAB — PROTIME-INR
INR: 0.9
Prothrombin Time: 12.1 seconds (ref 11.4–15.2)

## 2018-01-26 LAB — HEPARIN LEVEL (UNFRACTIONATED): Heparin Unfractionated: 0.52 IU/mL (ref 0.30–0.70)

## 2018-01-26 LAB — BASIC METABOLIC PANEL
Anion gap: 11 (ref 5–15)
BUN: 11 mg/dL (ref 6–20)
CHLORIDE: 99 mmol/L (ref 98–111)
CO2: 23 mmol/L (ref 22–32)
Calcium: 9.4 mg/dL (ref 8.9–10.3)
Creatinine, Ser: 0.59 mg/dL (ref 0.44–1.00)
GFR calc non Af Amer: >60 mL/min (ref >60)
Glucose, Bld: 492 mg/dL — ABNORMAL HIGH (ref 70–99)
POTASSIUM: 4.1 mmol/L (ref 3.5–5.1)
SODIUM: 133 mmol/L — AB (ref 135–145)

## 2018-01-26 LAB — TROPONIN I
Troponin I: <0.03 ng/mL (ref <0.03)
Troponin I: <0.03 ng/mL (ref <0.03)
Troponin I: <0.03 ng/mL (ref <0.03)
Troponin I: <0.03 ng/mL (ref <0.03)

## 2018-01-26 LAB — APTT: APTT: 27 s (ref 24–36)

## 2018-01-26 LAB — TSH: TSH: 1.257 u[IU]/mL (ref 0.350–4.500)

## 2018-01-26 LAB — FIBRIN DERIVATIVES D-DIMER (ARMC ONLY): Fibrin derivatives D-dimer (ARMC): 309.42 ng/mL (FEU) (ref 0.00–499.00)

## 2018-01-26 MED ORDER — ONDANSETRON HCL 4 MG PO TABS: 4.0000 mg | ORAL_TABLET | Freq: Four times a day (QID) | ORAL | Status: DC | PRN

## 2018-01-26 MED ORDER — ONDANSETRON HCL 4 MG/2ML IJ SOLN
4.0000 mg | Freq: Four times a day (QID) | INTRAMUSCULAR | Status: DC | PRN
  Administered 2018-01-27: 4 mg via INTRAVENOUS
  Filled 2018-01-26: qty 2

## 2018-01-26 MED ORDER — CYCLOBENZAPRINE HCL 10 MG PO TABS
5.0000 mg | ORAL_TABLET | Freq: Three times a day (TID) | ORAL | Status: DC
  Administered 2018-01-26 – 2018-01-28 (×7): 5 mg via ORAL
  Filled 2018-01-26 (×6): qty 1

## 2018-01-26 MED ORDER — ASPIRIN EC 81 MG PO TBEC
81.0000 mg | DELAYED_RELEASE_TABLET | Freq: Every day | ORAL | Status: DC
  Administered 2018-01-27 – 2018-01-28 (×2): 81 mg via ORAL
  Filled 2018-01-26 (×2): qty 1

## 2018-01-26 MED ORDER — MORPHINE SULFATE (PF) 4 MG/ML IV SOLN
4.0000 mg | Freq: Once | INTRAVENOUS | Status: AC
  Administered 2018-01-26: 4 mg via INTRAVENOUS

## 2018-01-26 MED ORDER — TICAGRELOR 90 MG PO TABS
90.0000 mg | ORAL_TABLET | Freq: Two times a day (BID) | ORAL | Status: DC
  Administered 2018-01-26 – 2018-01-28 (×5): 90 mg via ORAL
  Filled 2018-01-26 (×5): qty 1

## 2018-01-26 MED ORDER — HEPARIN (PORCINE) IN NACL 100-0.45 UNIT/ML-% IJ SOLN
900.0000 [IU]/h | INTRAMUSCULAR | Status: DC
  Administered 2018-01-26: 700 [IU]/h via INTRAVENOUS
  Filled 2018-01-26: qty 250

## 2018-01-26 MED ORDER — ALBUTEROL SULFATE (2.5 MG/3ML) 0.083% IN NEBU: 2.5000 mg | INHALATION_SOLUTION | RESPIRATORY_TRACT | Status: DC | PRN

## 2018-01-26 MED ORDER — MORPHINE SULFATE (PF) 2 MG/ML IV SOLN
INTRAVENOUS | Status: AC
  Filled 2018-01-26: qty 2

## 2018-01-26 MED ORDER — ONDANSETRON HCL 4 MG/2ML IJ SOLN
4.0000 mg | INTRAMUSCULAR | Status: AC
  Administered 2018-01-26: 4 mg via INTRAVENOUS
  Filled 2018-01-26: qty 2

## 2018-01-26 MED ORDER — ATORVASTATIN CALCIUM 80 MG PO TABS
80.0000 mg | ORAL_TABLET | Freq: Every day | ORAL | Status: DC
  Administered 2018-01-26 – 2018-01-27 (×2): 80 mg via ORAL
  Filled 2018-01-26: qty 4
  Filled 2018-01-26: qty 1
  Filled 2018-01-26: qty 4
  Filled 2018-01-26 (×2): qty 1

## 2018-01-26 MED ORDER — DIGOXIN 125 MCG PO TABS
0.1250 mg | ORAL_TABLET | Freq: Every day | ORAL | Status: DC
  Administered 2018-01-26 – 2018-01-28 (×3): 0.125 mg via ORAL
  Filled 2018-01-26 (×3): qty 1

## 2018-01-26 MED ORDER — IOPAMIDOL (ISOVUE-370) INJECTION 76%
75.0000 mL | Freq: Once | INTRAVENOUS | Status: AC | PRN
  Administered 2018-01-26: 75 mL via INTRAVENOUS

## 2018-01-26 MED ORDER — ACETAMINOPHEN 325 MG PO TABS
650.0000 mg | ORAL_TABLET | Freq: Four times a day (QID) | ORAL | Status: DC | PRN
  Administered 2018-01-28 (×2): 650 mg via ORAL
  Filled 2018-01-26 (×2): qty 2

## 2018-01-26 MED ORDER — HEPARIN SODIUM (PORCINE) 5000 UNIT/ML IJ SOLN
60.0000 [IU]/kg | Freq: Once | INTRAMUSCULAR | Status: AC
  Administered 2018-01-26: 3550 [IU] via INTRAVENOUS
  Filled 2018-01-26: qty 1

## 2018-01-26 MED ORDER — INSULIN ASPART 100 UNIT/ML ~~LOC~~ SOLN
0.0000 [IU] | Freq: Three times a day (TID) | SUBCUTANEOUS | Status: DC
  Administered 2018-01-26: 20 [IU] via SUBCUTANEOUS
  Administered 2018-01-26: 6 [IU] via SUBCUTANEOUS
  Administered 2018-01-27 (×2): 15 [IU] via SUBCUTANEOUS
  Administered 2018-01-28: 4 [IU] via SUBCUTANEOUS
  Administered 2018-01-28: 15 [IU] via SUBCUTANEOUS
  Filled 2018-01-26 (×5): qty 1

## 2018-01-26 MED ORDER — MORPHINE SULFATE (PF) 2 MG/ML IV SOLN
2.0000 mg | INTRAVENOUS | Status: DC | PRN
  Administered 2018-01-26 – 2018-01-27 (×6): 2 mg via INTRAVENOUS
  Filled 2018-01-26 (×6): qty 1

## 2018-01-26 MED ORDER — MIDODRINE HCL 5 MG PO TABS
2.5000 mg | ORAL_TABLET | Freq: Three times a day (TID) | ORAL | Status: DC
  Administered 2018-01-26 – 2018-01-27 (×3): 2.5 mg via ORAL
  Filled 2018-01-26 (×3): qty 1

## 2018-01-26 MED ORDER — ACETAMINOPHEN 650 MG RE SUPP: 650.0000 mg | Freq: Four times a day (QID) | RECTAL | Status: DC | PRN

## 2018-01-26 MED ORDER — NICOTINE 14 MG/24HR TD PT24
14.0000 mg | MEDICATED_PATCH | TRANSDERMAL | Status: DC
  Filled 2018-01-26 (×3): qty 1

## 2018-01-26 MED ORDER — CITALOPRAM HYDROBROMIDE 20 MG PO TABS
20.0000 mg | ORAL_TABLET | Freq: Every day | ORAL | Status: DC
  Administered 2018-01-26 – 2018-01-28 (×3): 20 mg via ORAL
  Filled 2018-01-26 (×3): qty 1

## 2018-01-26 MED ORDER — CYCLOBENZAPRINE HCL 10 MG PO TABS: 10.0000 mg | ORAL_TABLET | Freq: Three times a day (TID) | ORAL | Status: DC | PRN

## 2018-01-26 MED ORDER — DOCUSATE SODIUM 100 MG PO CAPS
100.0000 mg | ORAL_CAPSULE | Freq: Two times a day (BID) | ORAL | Status: DC
  Administered 2018-01-26 – 2018-01-28 (×4): 100 mg via ORAL
  Filled 2018-01-26 (×4): qty 1

## 2018-01-26 MED ORDER — INSULIN GLARGINE 100 UNIT/ML ~~LOC~~ SOLN
25.0000 [IU] | Freq: Every day | SUBCUTANEOUS | Status: DC
  Administered 2018-01-26: 25 [IU] via SUBCUTANEOUS
  Filled 2018-01-26 (×2): qty 0.25

## 2018-01-26 MED ORDER — LORAZEPAM 1 MG PO TABS
1.0000 mg | ORAL_TABLET | Freq: Three times a day (TID) | ORAL | Status: DC
  Administered 2018-01-26 – 2018-01-28 (×8): 1 mg via ORAL
  Filled 2018-01-26 (×8): qty 1

## 2018-01-26 MED ORDER — RANOLAZINE ER 500 MG PO TB12
500.0000 mg | ORAL_TABLET | Freq: Two times a day (BID) | ORAL | Status: DC
  Administered 2018-01-26 – 2018-01-28 (×4): 500 mg via ORAL
  Filled 2018-01-26 (×6): qty 1

## 2018-01-26 NOTE — Progress Notes (Signed)
Patient ID: Renee Mack, female   DOB: 30-Jul-1965, 52 y.o.   MRN: [id number]  ACP note  Patient at the bedside  Diagnosis: Chest pain with history of CAD, neck and back pain, history of systolic CHF with cardiomyopathy, diabetes mellitus, COPD.  Patient wants to be a full code.  Plan.  I ordered a CT scan of the chest which was negative for pulmonary embolism.  [place] cardiology consultation.  On heparin drip, aspirin, Brilinta, atorvastatin.  Blood pressure too low for beta-blocker.  I will order an MRI of the cervical spine just in case this is musculoskeletal.  Patient unable to take anti-inflammatory or steroids.  Time spent on ACP discussion 28 minutes Dr. Loletha Grayer

## 2018-01-26 NOTE — ED Triage Notes (Signed)
Pt stated that she woke up about 0330 with substernal chest pain, a lot of pressure in her chest, neck, arm and back. Pt also has nausea and SOB. Pt had 4 stents placed last month and pt arrived with a life vest on. Pt received 1 nitro via EMS and she stated that she took one prior to EMS arriving. Pt stated that the pain has eased some.

## 2018-01-26 NOTE — H&P (Addendum)
Renee Mack is an 52 y.o. female.   Chief Complaint: Chest pain HPI: The patient with past medical history of coronary artery disease status post MI and PCI to the LAD last month presents to the emergency department with chest pain.  The patient reports that her pain awoke her from sleep.  It was substernal radiating to her posterior neck.  She became nauseous and had one episode of nonbloody nonbilious emesis.  She admits to diaphoresis as well as shortness of breath.  Her pain has eased off some at this time but she continues to have dull pressure in her chest.  Review of cardiology notes shows that the patient has had in-stent restenosis in the past in addition to a current blockage that may or may not be amenable to PCI.  Due to the risk for recurrent coronary thrombosis emergency department staff initiated anticoagulation prior to calling the hospitalist service for admission.  Past Medical History:  Diagnosis Date  . Cancer ([place])    vulvular  . Chronic combined systolic (congestive) and diastolic (congestive) heart failure ([place])    a. 12/2017 Echo: EF 30-35%, mid-apicalanteroseptal, ant, and apical AK. Gr1 DD. Mild conc LVH.  Marland Kitchen COPD (chronic obstructive pulmonary disease) (HCC)    not on home oxygen  . Coronary artery disease    a. 12/2017 ACS/PCI: LM nl, LAD 100p (2.25x26 Resolute Onyx DES), 42m(2.0x12 Resolute Onyx DES), 80d (2.0x15 Resolute Onyx DES), LCX 30p, RCA 90p (non-dominant). EF 25-35%. Post-MI course complicated by cardiogenic shock req vasopressor Rx; b. 12/2017 NSTEMI/subacute thrombosis-->LAD 100 (PTCA + DES x 1).  . Diabetes mellitus without complication ([place]   . GERD (gastroesophageal reflux disease)   . HLD (hyperlipidemia)   . Ischemic cardiomyopathy    a. 12/2017 Echo: EF 30-35%.  . Myocardial infarction ([place]    a. 12/2017-->DES to LAD x 3.  . Pancreatitis   . Shingles   . Tobacco abuse     Past Surgical History:  Procedure Laterality Date  . ABDOMINAL  HYSTERECTOMY     partial  . CORONARY STENT INTERVENTION N/A 12/26/2017   Procedure: CORONARY STENT INTERVENTION;  Surgeon: AWellington Hampshire MD;  [facility];  Service: Cardiovascular;  Laterality: N/A;  . CORONARY/GRAFT ACUTE MI REVASCULARIZATION N/A 12/19/2017   Procedure: Coronary/Graft Acute MI Revascularization;  Surgeon: AWellington Hampshire MD;  [facility];  Service: Cardiovascular;  Laterality: N/A;  . ECTOPIC PREGNANCY SURGERY Left   . LEFT HEART CATH AND CORONARY ANGIOGRAPHY N/A 12/19/2017   Procedure: LEFT HEART CATH AND CORONARY ANGIOGRAPHY;  Surgeon: AWellington Hampshire MD;  [facility];  Service: Cardiovascular;  Laterality: N/A;  . LEFT HEART CATH AND CORONARY ANGIOGRAPHY N/A 12/26/2017   Procedure: LEFT HEART CATH AND CORONARY ANGIOGRAPHY;  Surgeon: AWellington Hampshire MD;  [facility];  Service: Cardiovascular;  Laterality: N/A;  . LYMPHADENECTOMY    . SPLENECTOMY, PARTIAL    . vulvulasectomy      Family History  Problem Relation Age of Onset  . Cancer Mother   . Osteoporosis Sister   . Heart disease Brother   . Heart attack Father    Social History:  reports that she has quit smoking. Her smoking use included cigarettes. She has a 15.00 pack-year smoking history. She has never used smokeless tobacco. She reports that she does not drink alcohol or use drugs.  Allergies:  Allergies  Allergen Reactions  . Bee Venom Itching, Shortness Of  Breath and Swelling  . Metformin And Related Shortness Of Breath  . Darvon [Propoxyphene] Itching  . Gabapentin Swelling  . Nsaids Other (See Comments)    Ulcers   . Tramadol Hives     (Not in a hospital admission)  Results for orders placed or performed during the hospital encounter of 01/26/18 (from the past 48 hour(s))  Basic metabolic panel     Status: Abnormal   Collection Time: 01/26/18  5:03 AM  Result Value Ref Range   Sodium 133 (L) 135 - 145 mmol/L    Potassium 4.1 3.5 - 5.1 mmol/L   Chloride 99 98 - 111 mmol/L   CO2 23 22 - 32 mmol/L   Glucose, Bld 492 (H) 70 - 99 mg/dL   BUN 11 6 - 20 mg/dL   Creatinine, Ser 0.59 0.44 - 1.00 mg/dL   Calcium 9.4 8.9 - 10.3 mg/dL   GFR calc non Af Amer >60 >60 mL/min   GFR calc Af Amer >60 >60 mL/min    Comment: (NOTE) The eGFR has been calculated using the CKD EPI equation. This calculation has not been validated in all clinical situations. eGFR's persistently <60 mL/min signify possible Chronic Kidney Disease.    Anion gap 11 5 - 15    Comment: Performed at [facility], [address] [place], [place] [address]  CBC     Status: None   Collection Time: 01/26/18  5:03 AM  Result Value Ref Range   WBC 10.1 3.6 - 11.0 K/uL   RBC 4.34 3.80 - 5.20 MIL/uL   Hemoglobin 14.2 12.0 - 16.0 g/dL   HCT 41.1 35.0 - 47.0 %   MCV 94.7 80.0 - 100.0 fL   MCH 32.6 26.0 - 34.0 pg   MCHC 34.5 32.0 - 36.0 g/dL   RDW 14.4 11.5 - 14.5 %   Platelets 251 150 - 440 K/uL    Comment: Performed at [facility], [address] [place], [place] [address]  Troponin I     Status: None   Collection Time: 01/26/18  5:03 AM  Result Value Ref Range   Troponin I <0.03 <0.03 ng/mL    Comment: Performed at [facility], [address] [place], [place] [address]  Protime-INR     Status: None   Collection Time: 01/26/18  5:03 AM  Result Value Ref Range   Prothrombin Time 12.1 11.4 - 15.2 seconds   INR 0.90     Comment: Performed at [facility], [address] [place], [place] [address]  APTT     Status: None   Collection Time: 01/26/18  5:03 AM  Result Value Ref Range   aPTT 27 24 - 36 seconds    Comment: Performed at [facility], [address] [place], [place] [address]   Dg Chest 2 View  Result Date: 01/26/2018 CLINICAL DATA:  Acute onset of substernal chest pain, chest pressure, and neck, arm and back pressure. Nausea and shortness of breath. EXAM: CHEST - 2  VIEW COMPARISON:  Chest radiograph performed 01/15/2018 FINDINGS: The lungs are well-aerated. Minimal left basilar atelectasis is noted. There is no evidence of pleural effusion or pneumothorax. The heart is normal in size; the mediastinal contour is within normal limits. No acute osseous abnormalities are seen. IMPRESSION: Minimal left basilar atelectasis noted; lungs otherwise clear. Electronically Signed   By: Garald Balding M.D.   On: 01/26/2018 05:58    Review of Systems  Constitutional: Positive for diaphoresis. Negative  for chills and fever.  HENT: Negative for sore throat and tinnitus.   Eyes: Negative for blurred vision and redness.  Respiratory: Negative for cough and shortness of breath.   Cardiovascular: Positive for chest pain. Negative for palpitations, orthopnea and PND.  Gastrointestinal: Positive for nausea and vomiting. Negative for abdominal pain and diarrhea.  Genitourinary: Negative for dysuria, frequency and urgency.  Musculoskeletal: Negative for joint pain and myalgias.  Skin: Negative for rash.       No lesions  Neurological: Negative for speech change, focal weakness and weakness.  Endo/Heme/Allergies: Does not bruise/bleed easily.       No temperature intolerance  Psychiatric/Behavioral: Negative for depression and suicidal ideas.    Blood pressure 108/64, pulse 73, temperature (!) 97.4 F (36.3 C), temperature source Oral, resp. rate 16, height 5' 1"  (1.549 m), weight 59 kg, SpO2 96 %. Physical Exam  Vitals reviewed. Constitutional: She is oriented to person, place, and time. She appears well-developed and well-nourished. No distress.  HENT:  Head: Normocephalic and atraumatic.  Mouth/Throat: Oropharynx is clear and moist.  Eyes: Pupils are equal, round, and reactive to light. Conjunctivae and EOM are normal. No scleral icterus.  Neck: Normal range of motion. Neck supple. No JVD present. No tracheal deviation present. No thyromegaly present.  Cardiovascular:  Normal rate, regular rhythm and normal heart sounds. Exam reveals no gallop and no friction rub.  No murmur heard. Respiratory: Effort normal and breath sounds normal.  GI: Soft. Bowel sounds are normal. She exhibits no distension. There is no tenderness.  Genitourinary:  Genitourinary Comments: Deferred  Musculoskeletal: Normal range of motion.  Lymphadenopathy:    She has no cervical adenopathy.  Neurological: She is alert and oriented to person, place, and time. No cranial nerve deficit. She exhibits normal muscle tone.  Skin: Skin is warm and dry. No rash noted. No erythema.  Psychiatric: She has a normal mood and affect. Her behavior is normal. Judgment and thought content normal.     Assessment/Plan This is a 52 year old female admitted for chest pain. 1.  Chest pain: Anginal; currently on heparin drip.  No EKG changes at this time.  Troponins are negative.  Consult cardiology and maintain n.p.o. status in anticipation of repeat catheterization. 2.  CAD: History of STEMI status post drug-eluting stent to LAD.  Continue aspirin and Brilinta 3.  CHF: Chronic; combined systolic and diastolic.  Continue digoxin for symptomatic improvement.  The patient is euvolemic. 4.  Diabetes mellitus type 2: Continue basal insulin therapy.  Sliding scale insulin while hospitalized. 5.  Hyper lipidemia: Continue statin therapy 6.  Depression: Continue Celexa 7.  DVT prophylaxis: Therapeutic anticoagulation 8.  GI prophylaxis: None The patient is a full code.  Time spent on admission orders and patient care approximately 45 minutes  Harrie Foreman, MD 01/26/2018, 7:28 AM

## 2018-01-26 NOTE — Progress Notes (Signed)
ANTICOAGULATION CONSULT NOTE - Initial Consult  Pharmacy Consult for heparin drip Indication: chest pain/ACS  Allergies  Allergen Reactions  . Bee Venom Itching, Shortness Of Breath and Swelling  . Metformin And Related Shortness Of Breath  . Darvon [Propoxyphene] Itching  . Gabapentin Swelling  . Nsaids Other (See Comments)    Ulcers   . Tramadol Hives    Patient Measurements: Height: 5\' 1"  (154.9 cm) Weight: 130 lb (59 kg) IBW/kg (Calculated) : 47.8 Heparin Dosing Weight: 59 kg  Vital Signs: Temp: 97.4 F (36.3 C) (08/08 0507) Temp Source: Oral (08/08 0507) BP: 109/65 (08/08 0507) Pulse Rate: 70 (08/08 0507)  Labs: Recent Labs    01/26/18 0503  HGB 14.2  HCT 41.1  PLT 251  CREATININE 0.59  TROPONINI <0.03    Estimated Creatinine Clearance: 66.4 mL/min (by C-G formula based on SCr of 0.59 mg/dL).   Medical History: Past Medical History:  Diagnosis Date  . Cancer ([place])    vulvular  . Chronic combined systolic (congestive) and diastolic (congestive) heart failure ([place])    a. 12/2017 Echo: EF 30-35%, mid-apicalanteroseptal, ant, and apical AK. Gr1 DD. Mild conc LVH.  Marland Kitchen COPD (chronic obstructive pulmonary disease) (HCC)    not on home oxygen  . Coronary artery disease    a. 12/2017 ACS/PCI: LM nl, LAD 100p (2.25x26 Resolute Onyx DES), 54m (2.0x12 Resolute Onyx DES), 80d (2.0x15 Resolute Onyx DES), LCX 30p, RCA 90p (non-dominant). EF 25-35%. Post-MI course complicated by cardiogenic shock req vasopressor Rx; b. 12/2017 NSTEMI/subacute thrombosis-->LAD 100 (PTCA + DES x 1).  . Diabetes mellitus without complication ([place])   . GERD (gastroesophageal reflux disease)   . HLD (hyperlipidemia)   . Ischemic cardiomyopathy    a. 12/2017 Echo: EF 30-35%.  . Myocardial infarction ([place])    a. 12/2017-->DES to LAD x 3.  . Pancreatitis   . Shingles   . Tobacco abuse     Medications:  No anticoag in PTA meds.  Assessment: Trop <0.03   Goal of Therapy:  Heparin level  0.3-0.7 units/ml Monitor platelets by anticoagulation protocol: Yes   Plan:  3550 unit bolus per ECP. 700 units/hr initial rate. First heparin level 6 hours after start of infusion.  Renee Mack S 01/26/2018,6:02 AM

## 2018-01-26 NOTE — ED Notes (Signed)
Resumed care from brittany, rn. Pt resting, pain 7/10. Pt alert & oriented with NAD noted.

## 2018-01-26 NOTE — Progress Notes (Signed)
ANTICOAGULATION CONSULT NOTE - Initial Consult  Pharmacy Consult for heparin drip Indication: chest pain/ACS  Allergies  Allergen Reactions  . Bee Venom Itching, Shortness Of Breath and Swelling  . Metformin And Related Shortness Of Breath  . Darvon [Propoxyphene] Itching  . Gabapentin Swelling  . Nsaids Other (See Comments)    Ulcers   . Tramadol Hives    Patient Measurements: Height: 5\' 1"  (154.9 cm) Weight: 134 lb 1.6 oz (60.8 kg) IBW/kg (Calculated) : 47.8 Heparin Dosing Weight: 59 kg  Vital Signs: Temp: 97.4 F (36.3 C) (08/08 0507) Temp Source: Oral (08/08 0507) BP: 101/59 (08/08 0810) Pulse Rate: 69 (08/08 0810)  Labs: Recent Labs    01/26/18 0503 01/26/18 0825 01/26/18 1302  HGB 14.2  --   --   HCT 41.1  --   --   PLT 251  --   --   APTT 27  --   --   LABPROT 12.1  --   --   INR 0.90  --   --   HEPARINUNFRC  --   --  0.52  CREATININE 0.59  --   --   TROPONINI <0.03 <0.03 <0.03    Estimated Creatinine Clearance: 67.3 mL/min (by C-G formula based on SCr of 0.59 mg/dL).   Medical History: Past Medical History:  Diagnosis Date  . Cancer ([place])    vulvular  . Chronic combined systolic (congestive) and diastolic (congestive) heart failure ([place])    a. 12/2017 Echo: EF 30-35%, mid-apicalanteroseptal, ant, and apical AK. Gr1 DD. Mild conc LVH.  Marland Kitchen COPD (chronic obstructive pulmonary disease) (HCC)    not on home oxygen  . Coronary artery disease    a. 12/2017 ACS/PCI: LM nl, LAD 100p (2.25x26 Resolute Onyx DES), 72m (2.0x12 Resolute Onyx DES), 80d (2.0x15 Resolute Onyx DES), LCX 30p, RCA 90p (non-dominant). EF 25-35%. Post-MI course complicated by cardiogenic shock req vasopressor Rx; b. 12/2017 NSTEMI/subacute thrombosis-->LAD 100 (PTCA + DES x 1).  . Diabetes mellitus without complication ([place])   . GERD (gastroesophageal reflux disease)   . HLD (hyperlipidemia)   . Ischemic cardiomyopathy    a. 12/2017 Echo: EF 30-35%.  . Myocardial infarction ([place])    a.  12/2017-->DES to LAD x 3.  . Pancreatitis   . Shingles   . Tobacco abuse     Medications:  No anticoag in PTA meds.  Assessment: Pt presents with chest pain. She has a long cardiac hx. Troponin have been neg. VQ scan is low probability of VTE. Pharmacy consulted for heparin drip   Goal of Therapy:  Heparin level 0.3-0.7 units/ml Monitor platelets by anticoagulation protocol: Yes   Plan:  Heparin level therapeutic x 2. Will order heparin level for the AM along with CBC.  Ramond Dial, Pharm.D, BCPS Clinical Pharmacist 01/26/2018,2:46 PM

## 2018-01-26 NOTE — ED Provider Notes (Signed)
[facility] Emergency Department Provider Note  ____________________________________________   First MD Initiated Contact with Patient 01/26/18 [phone]     (approximate)  I have reviewed the triage vital signs and the nursing notes.   HISTORY  Chief Complaint Chest Pain    HPI Renee Mack is a 52 y.o. female with extensive medical history including recent MI requiring stents in which subsequently reoccluded with thrombus.  She has a substantially decreased ejection fraction and is wearing a LifeVest awaiting defibrillator placement.  She presents for evaluation of acute onset severe central chest pain and pressure that awoke her from sleep.  She says that every day she has some mild pain but that this pain is much more severe.  Is making her short of breath.  A nitroglycerin treatment prior to arrival made a little bit better.  She also took 4 baby aspirin prior to arrival.  She says the pain is little bit better now but still severe.  Nothing in particular makes it better or worse.  She denies recent fever/chills, nausea, vomiting, abdominal pain, and dysuria.  Dr. Fletcher Anon is her primary cardiologist.  Past Medical History:  Diagnosis Date  . Cancer ([place])    vulvular  . Chronic combined systolic (congestive) and diastolic (congestive) heart failure ([place])    a. 12/2017 Echo: EF 30-35%, mid-apicalanteroseptal, ant, and apical AK. Gr1 DD. Mild conc LVH.  Marland Kitchen COPD (chronic obstructive pulmonary disease) (HCC)    not on home oxygen  . Coronary artery disease    a. 12/2017 ACS/PCI: LM nl, LAD 100p (2.25x26 Resolute Onyx DES), 64m (2.0x12 Resolute Onyx DES), 80d (2.0x15 Resolute Onyx DES), LCX 30p, RCA 90p (non-dominant). EF 25-35%. Post-MI course complicated by cardiogenic shock req vasopressor Rx; b. 12/2017 NSTEMI/subacute thrombosis-->LAD 100 (PTCA + DES x 1).  . Diabetes mellitus without complication ([place])   . GERD (gastroesophageal reflux disease)   . HLD  (hyperlipidemia)   . Ischemic cardiomyopathy    a. 12/2017 Echo: EF 30-35%.  . Myocardial infarction ([place])    a. 12/2017-->DES to LAD x 3.  . Pancreatitis   . Shingles   . Tobacco abuse     Patient Active Problem List   Diagnosis Date Noted  . Non-ST elevation (NSTEMI) myocardial infarction ([place])   . Chronic systolic heart failure ([place])   . Ischemic chest pain 12/26/2017  . Chest pain 12/26/2017  . Unstable angina ([place])   . Acute ST elevation myocardial infarction (STEMI) of anterior wall ([place])   . Ischemic cardiomyopathy   . Acute systolic heart failure ([place])   . Cardiogenic shock ([place])   . Tobacco abuse   . Acute ST elevation myocardial infarction (STEMI) involving left anterior descending (LAD) coronary artery ([place]) 12/19/2017  . Protein-calorie malnutrition, severe 07/18/2017  . DKA (diabetic ketoacidoses) ([place]) 07/17/2017  . Acute ischemic enteritis ([place]) 06/12/2017  . Pyelonephritis 12/28/2014  . Type 2 diabetes mellitus ([place]) 12/28/2014  . COPD (chronic obstructive pulmonary disease) ([place]) 12/28/2014  . GERD (gastroesophageal reflux disease) 12/28/2014  . History of shingles 12/28/2014  . Hyperlipidemia LDL goal <70 12/28/2014    Past Surgical History:  Procedure Laterality Date  . ABDOMINAL HYSTERECTOMY     partial  . CORONARY STENT INTERVENTION N/A 12/26/2017   Procedure: CORONARY STENT INTERVENTION;  Surgeon: Wellington Hampshire, MD;  Location: [facility];  Service: Cardiovascular;  Laterality: N/A;  . CORONARY/GRAFT ACUTE MI REVASCULARIZATION N/A 12/19/2017   Procedure: Coronary/Graft Acute MI Revascularization;  Surgeon: [name]  A, MD;  Location: [facility];  Service: Cardiovascular;  Laterality: N/A;  . ECTOPIC PREGNANCY SURGERY Left   . LEFT HEART CATH AND CORONARY ANGIOGRAPHY N/A 12/19/2017   Procedure: LEFT HEART CATH AND CORONARY ANGIOGRAPHY;  Surgeon: Wellington Hampshire, MD;  Location: [facility];  Service: Cardiovascular;   Laterality: N/A;  . LEFT HEART CATH AND CORONARY ANGIOGRAPHY N/A 12/26/2017   Procedure: LEFT HEART CATH AND CORONARY ANGIOGRAPHY;  Surgeon: Wellington Hampshire, MD;  Location: [facility];  Service: Cardiovascular;  Laterality: N/A;  . LYMPHADENECTOMY    . SPLENECTOMY, PARTIAL    . vulvulasectomy      Prior to Admission medications   Medication Sig Start Date End Date Taking? Authorizing Provider  acetaminophen (TYLENOL) 325 MG tablet Take 650 mg by mouth every 6 (six) hours as needed for mild pain.    Yes [provider]  albuterol (PROAIR HFA) 108 (90 Base) MCG/ACT inhaler Inhale 1 puff into the lungs every 6 (six) hours as needed for wheezing or shortness of breath.    Yes [provider]  aspirin 81 MG tablet Take 81 mg by mouth daily.   Yes [provider]  atorvastatin (LIPITOR) 80 MG tablet Take 1 tablet (80 mg total) by mouth daily at 6 PM. 01/10/18 03/11/18 Yes Theora Gianotti, NP  citalopram (CELEXA) 20 MG tablet Take 20 mg daily by mouth.   Yes [provider]  cyclobenzaprine (FLEXERIL) 10 MG tablet Take 1 tablet (10 mg total) by mouth 3 (three) times daily as needed for muscle spasms. 01/17/18  Yes Dustin Flock, MD  digoxin (LANOXIN) 0.125 MG tablet Take 1 tablet (0.125 mg total) by mouth daily. 01/10/18  Yes Theora Gianotti, NP  insulin aspart (NOVOLOG) 100 UNIT/ML injection Inject 10 Units into the skin 3 (three) times daily before meals. Take 10 units PLUS sliding scale   Yes [provider]  insulin glargine (LANTUS) 100 UNIT/ML injection Inject 60 Units into the skin at bedtime.   Yes [provider]  LORazepam (ATIVAN) 1 MG tablet Take 1 mg by mouth every 8 (eight) hours.   Yes [provider]  midodrine (PROAMATINE) 2.5 MG tablet Take 1 tablet (2.5 mg total) by mouth 3 (three) times daily with meals. 12/30/17  Yes Wieting, Richard, MD  nicotine (NICODERM CQ - DOSED IN MG/24 HOURS) 14  mg/24hr patch Place 1 patch (14 mg total) onto the skin daily. Okay to substitute generic patch 12/30/17 12/30/18 Yes Wieting, Richard, MD  ondansetron (ZOFRAN) 4 MG tablet Take 1 tablet (4 mg total) by mouth daily as needed. 01/05/18  Yes Schaevitz, Randall An, MD  ticagrelor (BRILINTA) 90 MG TABS tablet Take 1 tablet (90 mg total) by mouth 2 (two) times daily. 01/10/18 03/11/18 Yes Theora Gianotti, NP    Allergies Bee venom; Metformin and related; Darvon [propoxyphene]; Gabapentin; Nsaids; and Tramadol  Family History  Problem Relation Age of Onset  . Cancer Mother   . Osteoporosis Sister   . Heart disease Brother   . Heart attack Father     Social History Social History   Tobacco Use  . Smoking status: Former Smoker    Packs/day: 0.50    Years: 30.00    Pack years: 15.00    Types: Cigarettes  . Smokeless tobacco: Never Used  . Tobacco comment: quit 12/19/2017.  Substance Use Topics  . Alcohol use: No  . Drug use: No    Review of  Systems Constitutional: No fever/chills Eyes: No visual changes. ENT: No sore throat. Cardiovascular: Chest pain as described above Respiratory: Shortness of breath as described above associated with the chest pain Gastrointestinal: No abdominal pain.  No nausea, no vomiting.  No diarrhea.  No constipation. Genitourinary: Negative for dysuria. Musculoskeletal: Negative for neck pain.  Negative for back pain. Integumentary: Negative for rash. Neurological: Negative for headaches, focal weakness or numbness.   ____________________________________________   PHYSICAL EXAM:  VITAL SIGNS: ED Triage Vitals  Enc Vitals Group     BP 01/26/18 0507 109/65     Pulse Rate 01/26/18 0507 70     Resp 01/26/18 0507 16     Temp 01/26/18 0507 (!) 97.4 F (36.3 C)     Temp Source 01/26/18 0507 Oral     SpO2 01/26/18 0507 97 %     Weight 01/26/18 0508 59 kg (130 lb)     Height 01/26/18 0508 1.549 m (5\' 1" )     Head Circumference --      Peak  Flow --      Pain Score 01/26/18 0508 8     Pain Loc --      Pain Edu? --      Excl. in [place]? --     Constitutional: Alert and oriented.  Chronically ill-appearing and in mild distress at this time Eyes: Conjunctivae are normal.  Head: Atraumatic. Nose: No congestion/rhinnorhea. Mouth/Throat: Mucous membranes are moist. Neck: No stridor.  No meningeal signs.   Cardiovascular: Normal rate, regular rhythm. Good peripheral circulation. Grossly normal heart sounds.  The patient is wearing a LifeVest. Respiratory: Normal respiratory effort but with increased respiratory rate.  No retractions. Lungs CTAB. Gastrointestinal: Soft and nontender. No distention.  Musculoskeletal: No lower extremity tenderness nor edema. No gross deformities of extremities. Neurologic:  Normal speech and language. No gross focal neurologic deficits are appreciated.  Skin:  Skin is warm, mildly diaphoretic and intact. No rash noted. Psychiatric: Mood and affect are normal. Speech and behavior are normal.  ____________________________________________   LABS (all labs ordered are listed, but only abnormal results are displayed)  Labs Reviewed  BASIC METABOLIC PANEL - Abnormal; Notable for the following components:      Result Value   Sodium 133 (*)    Glucose, Bld 492 (*)    All other components within normal limits  CBC  TROPONIN I  PROTIME-INR  APTT  HEPARIN LEVEL (UNFRACTIONATED)   ____________________________________________  EKG  ED ECG REPORT I, Hinda Kehr, the attending physician, personally viewed and interpreted this ECG.  Date: 01/26/2018 EKG Time: 5:03 AM Rate: 70 Rhythm: normal sinus rhythm QRS Axis: normal Intervals: normal ST/T Wave abnormalities: Non-specific ST segment / T-wave changes, but no evidence of acute ischemia. Narrative Interpretation: no evidence of acute ischemia   ____________________________________________  RADIOLOGY I, Hinda Kehr, personally viewed and  evaluated these images (plain radiographs) as part of my medical decision making, as well as reviewing the written report by the radiologist.  ED MD interpretation: No acute intrathoracic abnormalities on chest x-ray  Official radiology report(s): Dg Chest 2 View  Result Date: 01/26/2018 CLINICAL DATA:  Acute onset of substernal chest pain, chest pressure, and neck, arm and back pressure. Nausea and shortness of breath. EXAM: CHEST - 2 VIEW COMPARISON:  Chest radiograph performed 01/15/2018 FINDINGS: The lungs are well-aerated. Minimal left basilar atelectasis is noted. There is no evidence of pleural effusion or pneumothorax. The heart is normal in size; the mediastinal contour is within normal  limits. No acute osseous abnormalities are seen. IMPRESSION: Minimal left basilar atelectasis noted; lungs otherwise clear. Electronically Signed   By: Garald Balding M.D.   On: 01/26/2018 05:58    ____________________________________________   PROCEDURES  Critical Care performed: Yes, see critical care procedure note(s)   Procedure(s) performed:   .Critical Care Performed by: Hinda Kehr, MD Authorized by: Hinda Kehr, MD   Critical care provider statement:    Critical care time (minutes):  30   Critical care time was exclusive of:  Separately billable procedures and treating other patients   Critical care was necessary to treat or prevent imminent or life-threatening deterioration of the following conditions: unstable angina.   Critical care was time spent personally by me on the following activities:  Development of treatment plan with patient or surrogate, discussions with consultants, evaluation of patient's response to treatment, examination of patient, obtaining history from patient or surrogate, ordering and performing treatments and interventions, ordering and review of laboratory studies, ordering and review of radiographic studies, pulse oximetry, re-evaluation of patient's condition  and review of old charts     ____________________________________________   INITIAL IMPRESSION / ASSESSMENT AND PLAN / ED COURSE  As part of my medical decision making, I reviewed the following data within the [facility] notes reviewed and incorporated, Labs reviewed , EKG interpreted , Old chart reviewed, Radiograph reviewed , Discussed with admitting physician  and Notes from prior ED visits    Differential diagnosis includes, but is not limited to, ACS, papillary muscle rupture, Dressler syndrome, pneumonia.  I reviewed the medical record including the most recent cardiology clinic note which mentions that the patient has severe persistent disease and is at significant risk of reocclusion of her LAD and may require additional intervention and that she was told by her cardiologist to call 911 or return immediately to the hospital if she develops worsening or evolving symptoms.  There is no evidence of acute ischemia on her EKG, her vital signs are stable, and her lab work is reassuring except for her hyperglycemia.  She has a negative troponin at this time and a normal chest x-ray.  Given the high risk of this presentation representing unstable angina, I am treating with heparin bolus and infusion and will discuss the case with the hospitalist for admission.  She does not require emergent catheterization at this time.  I am treating with morphine 4 mg IV and Zofran 4 mg IV.  The patient understands and agrees with the plan.  Clinical Course as of Jan 26 738  Thu Jan 26, 2018  0559 Discussed case in person with Dr. Marcille Blanco of the hospitalist service who will admit.  He agreed with the plan of care including heparin.  Patient already took 4 baby ASA PTA   [CF]    Clinical Course User Index [CF] Hinda Kehr, MD    ____________________________________________  FINAL CLINICAL IMPRESSION(S) / ED DIAGNOSES  Final diagnoses:  Unstable angina ([place])      MEDICATIONS GIVEN DURING THIS VISIT:  Medications  morphine 2 MG/ML injection (has no administration in time range)  heparin ADULT infusion 100 units/mL (25000 units/261mL sodium chloride 0.45%) (700 Units/hr Intravenous Transfusing/Transfer 01/26/18 0711)  morphine 4 MG/ML injection 4 mg (4 mg Intravenous Given 01/26/18 0604)  ondansetron (ZOFRAN) injection 4 mg (4 mg Intravenous Given 01/26/18 0604)  heparin injection 3,550 Units (3,550 Units Intravenous Given 01/26/18 7846)     ED Discharge Orders    None  Note:  This document was prepared using Dragon voice recognition software and may include unintentional dictation errors.    Hinda Kehr, MD 01/26/18 [phone]

## 2018-01-26 NOTE — Consult Note (Signed)
Cardiology Consult    Patient ID: Renee Mack MRN: [id number], DOB/AGE: [birth year]   Admit date: 01/26/2018 Date of Consult: 01/26/2018  Primary Physician: Janalyn Shy, MD Primary Cardiologist: [name], MD Requesting Provider: Dr. Marcille Blanco  Patient Profile    Renee Mack is a 52 y.o. female with a history of CAD s/p 12/19/2017 with PCI and DES to LAD x3  and subsequent readmission for subacute stent thrombosis with repeat PCI and DES x1 to mid LAD, ICM, chronic combined systolic and diastolic heart failure (09/100 EF 30 - 35%), DM 2, COPD, HLD, GERD, hepatitis, past tobacco abuse, history of vulvular cancer, who is being seen today for the evaluation of chest pain at the request of Dr. Marcille Blanco.  Past Medical History   Past Medical History:  Diagnosis Date  . Cancer ([place])    vulvular  . Chronic combined systolic (congestive) and diastolic (congestive) heart failure ([place])    a. 12/2017 Echo: EF 30-35%, mid-apicalanteroseptal, ant, and apical AK. Gr1 DD. Mild conc LVH.  Marland Kitchen COPD (chronic obstructive pulmonary disease) ([facility])    not on home oxygen  . Coronary artery disease    a. 12/2017 ACS/PCI: LM nl, LAD 100p (2.25x26 Resolute Onyx DES), 73m (2.0x12 Resolute Onyx DES), 80d (2.0x15 Resolute Onyx DES), LCX 30p, RCA 90p (non-dominant). EF 25-35%. Post-MI course complicated by cardiogenic shock req vasopressor Rx; b. 12/2017 NSTEMI/subacute thrombosis-->LAD 100 (PTCA + DES x 1).  . Diabetes mellitus without complication ([place])   . GERD (gastroesophageal reflux disease)   . HLD (hyperlipidemia)   . Ischemic cardiomyopathy    a. 12/2017 Echo: EF 30-35%.  . Myocardial infarction ([place])    a. 12/2017-->DES to LAD x 3.  . Pancreatitis   . Shingles   . Tobacco abuse     Past Surgical History:  Procedure Laterality Date  . ABDOMINAL HYSTERECTOMY     partial  . CORONARY STENT INTERVENTION N/A 12/26/2017   Procedure: CORONARY STENT INTERVENTION;  Surgeon: Wellington Hampshire, MD;  Location: [facility];  Service: Cardiovascular;  Laterality: N/A;  . CORONARY/GRAFT ACUTE MI REVASCULARIZATION N/A 12/19/2017   Procedure: Coronary/Graft Acute MI Revascularization;  Surgeon: Wellington Hampshire, MD;  Location: [facility];  Service: Cardiovascular;  Laterality: N/A;  . ECTOPIC PREGNANCY SURGERY Left   . LEFT HEART CATH AND CORONARY ANGIOGRAPHY N/A 12/19/2017   Procedure: LEFT HEART CATH AND CORONARY ANGIOGRAPHY;  Surgeon: Wellington Hampshire, MD;  Location: [facility];  Service: Cardiovascular;  Laterality: N/A;  . LEFT HEART CATH AND CORONARY ANGIOGRAPHY N/A 12/26/2017   Procedure: LEFT HEART CATH AND CORONARY ANGIOGRAPHY;  Surgeon: Wellington Hampshire, MD;  Location: [facility];  Service: Cardiovascular;  Laterality: N/A;  . LYMPHADENECTOMY    . SPLENECTOMY, PARTIAL    . vulvulasectomy       Allergies  Allergies  Allergen Reactions  . Bee Venom Itching, Shortness Of Breath and Swelling  . Metformin And Related Shortness Of Breath  . Darvon [Propoxyphene] Itching  . Gabapentin Swelling  . Nsaids Other (See Comments)    Ulcers   . Tramadol Hives    History of Present Illness    Patient with past medical history as listed above.  Previous Admissions: *12/19/2017:[facility] admission with a complaint of chest pain radiating to her neck and arm. Per documentation, she was found to have elevated troponin at that time and subsequently underwent cardiac catheterization revealing severe proximal, mid, and distal LAD disease & with  severe disease in a nondominant right coronary artery. EF 25-35%.  She reportedly then underwent stenting x3 to the LAD.  Post-catheterization course was documented as complicated by cardiogenic shock and hypotension, requiring vasopressors. [facility]   *Of note, cardiac catheterization documentation notes initial EKGs did not meet STEMI criteria given there was ST elevation only in V3.   However, given the patient's continued symptoms and change in her EKG from the previous ones, it was decided to proceed with urgent cath. *12/26/2017:  Patient reportedly presented to the emergency department with recurrent chest pain described as radiating to her neck, back, and down her arms with associated n/v and dyspnea.  She underwent repeat catheterization revealing a total occlusion of the proximal to mid LAD with large thrombus burden. The LAD was successfully treated with PTCA and drug-eluting stent x1 to the mid LAD.  Blood pressure remained very soft postprocedure with limited medication titration and patient discharged 12/30/17.  *Of note, cardiac catheterization documentation notes given anterior apical scar, related to late presentation, as well as long segment of stents, there would be a high chance for LAD might close again in the future but likely not much benefit of attempting another PCI.  It was thought at that time that it may be best to rely on collaterals from RCA, and it was noted that PCI of RCA might be indicated forward in spite of the vessel being nondominant. *01/10/18: F/u [facility] Ignacia Bayley, NP -patient reveals since her discharge she has had a few episodes of left upper chest/neck, and shoulder discomfort occurring exclusively when laying in bed at night, lasting only a few minutes, & resolving spontaneously.  She has not reported any daytime symptoms or symptoms with activity.  She did report nighttime episodes at that time that woke her from sleep.  Of note, she reported cessation of nighttime awakening s/p taking Nexium.  At that time she denied associated PND, orthopnea, dizziness, syncope, edema, early satiety, or dyspnea.  7/23 EKG NSR, 75 bpm, LAD, prior anterior septal infarct and anterior lateral T wave inversion. It was suspected that her anterior wall was completely infarcted and she was at significant risk for restenosis of her LAD with small RCA supplying  collaterals to anterior wall. Also per documentation, repeat PCI of the LAD suspected to futile. Patient was informed to return the hospital if developing worsening or evolving symptoms and remain on ASA, statin, Brilinta and LifeVest. Hypotension prevented addition of BB / ACE / ARB / ARNI.  F/u echo planned for 3 months.  *01/15/18: Patient presented to [facility] with complaint of chest pressure and pain, neck pain, and upper back pain.  She was seen by cardiology.  Per documentation, symptoms likely musculoskeletal etiology given atypical and troponin negative.  Recommendation was made for conservative therapy with analgesia and physical therapy..  Current Admission: *01/26/2018: Patient presented to the ED wearing a LifeVest and with c/o CP & SOB, stating she awoke from sleep around 3AM with acute chest pain rated 9/10 and much more severe that her everyday mild chest pain that she is used to experiencing and that has brought her to the hospital in the past.  She describes the chest pain onset as substernal sharp pain and radiating to the posterior aspect of the neck and down her back. Associated symptoms reportedly included diaphoresis, shortness of breath, nausea, and one episode of non-bloody nonbilious emesis.  She reports the pain felt only slightly  better with laying down, and that she felt minimal relief with nitroglycerin and 4 baby ASA prior to arrival at ED. Since admission, she rates the pain 7/10 and improving but still located in the center of her chest and radiating into her shoulders neck and back. She denies current fever chills nausea vomiting abdominal pain and dysuria. She also reports that "her legs feel tired, like she is run a marathon."  In the ED, vitals significant for sodium 133, potassium 4.1, glucose 492, creatinine 0.59, BUN 11, calcium 9.4, hemoglobin 14.2.  Troponin negative.  EKG significant for NSR and heart rate 70 bpm with nonspecific ST segment/T wave changes but no evidence of  acute ischemia. Chest x-ray significant for minimal left basilar atelectasis no noted cardiovascular disease. She was placed on a heparin drip, digoxin, morphine 4 mg IV, Zofran 4 mg IV, & made n.p.o.   Given the acute shortness of breath, her recent history of hospitalizations, and her leg symptoms, d-dimer was ordered and pending results to rule in or out PE.  Inpatient Medications    . aspirin EC  81 mg Oral Daily  . atorvastatin  80 mg Oral q1800  . citalopram  20 mg Oral Daily  . digoxin  0.125 mg Oral Daily  . docusate sodium  100 mg Oral BID  . insulin aspart  0-20 Units Subcutaneous TID WC  . insulin glargine  25 Units Subcutaneous QHS  . LORazepam  1 mg Oral Q8H  . midodrine  2.5 mg Oral TID WC  . morphine      . nicotine  14 mg Transdermal Q24H  . ticagrelor  90 mg Oral BID    Family History    Family History  Problem Relation Age of Onset  . Cancer Mother   . Osteoporosis Sister   . Heart disease Brother   . Heart attack Father    She indicated that her mother is deceased. She indicated that her father is deceased. She indicated that the status of her sister is unknown. She indicated that the status of her brother is unknown.   Social History    Social History   Socioeconomic History  . Marital status: Widowed    Spouse name: Not on file  . Number of children: Not on file  . Years of education: Not on file  . Highest education level: Not on file  Occupational History  . Not on file  Social Needs  . Financial resource strain: Not very hard  . Food insecurity:    Worry: Never true    Inability: Never true  . Transportation needs:    Medical: No    Non-medical: No  Tobacco Use  . Smoking status: Former Smoker    Packs/day: 0.50    Years: 30.00    Pack years: 15.00    Types: Cigarettes  . Smokeless tobacco: Never Used  . Tobacco comment: quit 12/19/2017.  Substance and Sexual Activity  . Alcohol use: No  . Drug use: No  . Sexual activity: Not on  file  Lifestyle  . Physical activity:    Days per week: 7 days    Minutes per session: 30 min  . Stress: Very much  Relationships  . Social connections:    Talks on phone: More than three times a week    Gets together: Patient refused    Attends religious service: Never    Active member of club or organization: No    Attends meetings of clubs or  organizations: Never    Relationship status: Widowed  . Intimate partner violence:    Fear of current or ex partner: No    Emotionally abused: No    Physically abused: No    Forced sexual activity: No  Other Topics Concern  . Not on file  Social History Narrative   Lives in [place] by herself.  Does not routinely exercise.     Review of Systems    General:  No current chills, fever, night sweats or weight changes.  Cardiovascular:  chest pain / pressure that radiates into shoulder / neck / back, +++dyspnea on exertion, no edema, b/l LE feel tired per patient report, orthopnea, palpitations, ++paroxysmal nocturnal dyspnea. Dermatological: No rash, lesions/masses Respiratory: No cough, ++ dyspnea Urologic: No hematuria, dysuria Abdominal:   No current nausea, vomiting, diarrhea, bright red blood per rectum, melena, or hematemesis Neurologic:  No visual changes, wkns, changes in mental status. All other systems reviewed and are otherwise negative except as noted above.  Physical Exam    Blood pressure (!) 101/59, pulse 69, temperature (!) 97.4 F (36.3 C), temperature source Oral, resp. rate 18, height 5\' 1"  (1.549 m), weight 59 kg, SpO2 98 %.  General: Patient appears distressed and intermittently winces with pain Psych: Normal affect. Neuro: Alert and oriented X 3. Moves all extremities spontaneously. HEENT: Normal  Neck: Supple without bruits or JVD. Lungs:  Resp regular and unlabored, CTA. Heart: RRR no s3, s4, or murmurs. Abdomen: Soft, non-tender, non-distended, BS + x 4.  Extremities: No clubbing, cyanosis or edema.  DP/PT/Radials 2+ and equal bilaterally.  Labs    Troponin (Point of Care Test) No results for input(s): TROPIPOC in the last 72 hours. Recent Labs    01/26/18 0503  TROPONINI <0.03   Lab Results  Component Value Date   WBC 10.1 01/26/2018   HGB 14.2 01/26/2018   HCT 41.1 01/26/2018   MCV 94.7 01/26/2018   PLT 251 01/26/2018    Recent Labs  Lab 01/26/18 0503  NA 133*  K 4.1  CL 99  CO2 23  BUN 11  CREATININE 0.59  CALCIUM 9.4  GLUCOSE 492*   Lab Results  Component Value Date   CHOL 125 12/28/2017   HDL 34 (L) 12/28/2017   LDLCALC 72 12/28/2017   TRIG 94 12/28/2017   No results found for: [facility]   Radiology Studies    Dg Chest 2 View  Result Date: 01/26/2018 CLINICAL DATA:  Acute onset of substernal chest pain, chest pressure, and neck, arm and back pressure. Nausea and shortness of breath. EXAM: CHEST - 2 VIEW COMPARISON:  Chest radiograph performed 01/15/2018 FINDINGS: The lungs are well-aerated. Minimal left basilar atelectasis is noted. There is no evidence of pleural effusion or pneumothorax. The heart is normal in size; the mediastinal contour is within normal limits. No acute osseous abnormalities are seen. IMPRESSION: Minimal left basilar atelectasis noted; lungs otherwise clear. Electronically Signed   By: Garald Balding M.D.   On: 01/26/2018 05:58   Ct Abdomen Pelvis W Contrast  Result Date: 01/05/2018 CLINICAL DATA:  Bilateral lower quadrant abdomen pain. EXAM: CT ABDOMEN AND PELVIS WITH CONTRAST TECHNIQUE: Multidetector CT imaging of the abdomen and pelvis was performed using the standard protocol following bolus administration of intravenous contrast. CONTRAST:  158mL OMNIPAQUE IOHEXOL 300 MG/ML  SOLN COMPARISON:  Oct 27, 2017 FINDINGS: Lower chest: No acute abnormality. Hepatobiliary: There is mild diffuse low density of the liver without vessel displacement. No focal liver lesion is  identified. The gallbladder is normal. There is dilatation of the common  bowel duct measuring 8.5 mm unchanged compared prior CT. Pancreas: Unremarkable. No pancreatic ductal dilatation or surrounding inflammatory changes. Spleen: Normal in size without focal abnormality. Adrenals/Urinary Tract: The adrenal glands are normal. There is focal scar of the upper pole left kidney. No renal lesion is identified. There is no hydronephrosis bilaterally. The bladder is partially decompressed without focal abnormality. Stomach/Bowel: There is a small hiatal hernia. The stomach is otherwise normal. There is no small bowel obstruction or diverticulitis. The appendix is normal. There is focal anterior herniation of the transverse colon without evidence of incarceration unchanged compared to prior CT. Vascular/Lymphatic: Aortic atherosclerosis. No enlarged abdominal or pelvic lymph nodes. Reproductive: Status post hysterectomy. No adnexal masses. Other: Umbilical herniation of mesenteric fat is identified. Musculoskeletal: No acute or significant osseous findings. IMPRESSION: No bowel obstruction. No evidence of diverticulitis. Focal herniation of transverse colon in the midline anterior abdomen without evidence of incarceration, unchanged compared prior CT. Fatty infiltration of liver. Focal scar of left kidney. Electronically Signed   By: Abelardo Diesel M.D.   On: 01/05/2018 16:32   Dg Chest Port 1 View  Result Date: 01/15/2018 CLINICAL DATA:  Chest pain EXAM: PORTABLE CHEST 1 VIEW COMPARISON:  07/17/2017 chest radiograph. FINDINGS: Stable cardiomediastinal silhouette with normal heart size. No pneumothorax. No pleural effusion. Lungs appear clear, with no acute consolidative airspace disease and no pulmonary edema. IMPRESSION: No active disease. Electronically Signed   By: Ilona Sorrel M.D.   On: 01/15/2018 10:50    ECG & Cardiac Imaging    12/26/2017  LHC  Dist LAD lesion is 10% stenosed.  Balloon angioplasty was performed.  Prox Cx lesion is 30% stenosed.  Prox RCA lesion is 90%  stenosed.  Prox LAD to Mid LAD lesion is 100% stenosed.  Mid LAD lesion is 70% stenosed.  A drug-eluting stent was successfully placed using a STENT RESOLUTE ONYX 2.0X18.  Post intervention, there is a 15% residual stenosis.  Post intervention, there is a 10% residual stenosis.  Balloon angioplasty was performed using a BALLOON [place] RX2.5X20.  Post intervention, there is a 0% residual stenosis.  Ost 2nd Diag to 2nd Diag lesion is 90% stenosed. 1.  Subacute stent thrombosis in the LAD likely due to poor outflow related to previous late presentation of anterior myocardial infarction as well as long segments of stents.  Collaterals are noted from the right coronary artery to the LAD. 2.  Moderately elevated left ventricular end-diastolic pressure. 3.  Successful very difficult angioplasty and drug-eluting stent placement to the LAD as outlined above. Recommendations: Given anterior apical scar related to late presentation as well as long segments of stents, there is a high chance that the LAD will close again in the future.  If that happens, there might not be much benefit of attempting another PCI.  Might be best to rely on collaterals from the right coronary artery.  In that situation, PCI of the right coronary artery might be indicated in spite of the vessel being nondominant.  This will be considered before hospital discharge. Recommend uninterrupted dual antiplatelet therapy with Aspirin 81mg  daily and Ticagrelor 90mg  twice daily for a minimum of 12 months (ACS - Class I recommendation).  Prefer long-term treatment beyond 1 year due to stent thrombosis. Please note that the patient's EKG did not meet criteria for a STEMI and this was done as an urgent case due to continued chest pain.  12/19/2017 LHC  Prox Cx lesion is 30% stenosed.  Prox LAD lesion is 100% stenosed.  Mid LAD lesion is 80% stenosed.  Dist LAD lesion is 80% stenosed.  Post intervention, there is a 0% residual  stenosis.  A drug-eluting stent was successfully placed using a STENT RESOLUTE ONYX 2.25X26.  Post intervention, there is a 0% residual stenosis.  Post intervention, there is a 10% residual stenosis.  A drug-eluting stent was successfully placed using a STENT RESOLUTE ONYX 2.0X15.  A drug-eluting stent was successfully placed using a STENT RESOLUTE ONYX 2.0X12.  The left ventricular ejection fraction is 25-35% by visual estimate.  Prox RCA lesion is 90% stenosed. 1.  Severe one-vessel coronary artery disease with thrombotic occlusion of the proximal LAD and significant disease in the mid and distal LAD.  Left dominant system.  Significant proximal stenosis in a nondominant right coronary artery. 2.  Moderately to severely reduced LV systolic function with an EF of 30% with akinesis of the mid to distal anterior and apical myocardium. 3.  Moderately elevated left ventricular end-diastolic pressure at 26 mmHg. 4.  Successful angioplasty and 3 drug-eluting stent placement to the LAD to establish TIMI-3 flow. Recommendations Continue Aggrastat for 12 hours. Dual antiplatelet therapy for at least one year Aggressive treatment of risk factors. Obtain an echocardiogram. Wean off norepinephrine drip and start small dose carvedilol once blood pressure tolerates. Please note that the initial EKGs did not meet STEMI criteria given that there was ST elevation only in V3.  However, given the patient's continued symptoms and change in her EKG from prior one, we decided to proceed with urgent cardiac catheterization.  12/19/17 TTE - Left ventricle: The cavity size was normal. There was mild concentric hypertrophy. Systolic function was moderately to severely reduced. The estimated ejection fraction was in the range of 30% to 35%. Akinesis of the mid-apicalanteroseptal, anterior, and apical myocardium. Doppler parameters are consistent with abnormal left ventricular relaxation (grade  1 diastolic dysfunction).  Assessment & Plan    1. Chest Pain with unstable angina & in setting of CAD, s/p 12/2017 PCI x2 with DES x3 to LAD & subsequent subacute stent thrombosis - Current chest pain as described in HPI, rated 9/10   7/10. - Patient wearing a LifeVest up to admission. Compliant with medications s/p 12/2017 cath.  - LHC/PCI and CP admissions with [facility] notes above. Note PCI of RCA discussed in 7/8 cath note. - Given negative troponin x2 & recent PCI with DES x2 to LAD, repeat cath not recommended at this time. - Pending results of d-dimer, ordered given SOB and recent hospitalizations at which time laying in bed. Patient also reports leg fatigue. It is reasonable to rule in/ out PE at this time.  -Continue to cycle troponin.   -Continue aspirin, statin, and Brilinta 90 mg. Continue heparin. -Further cardiac work-up pending results of d-dimer.  2.  Chronic combined systolic and diastolic heart failure with ischemic cardiomyopathy - Patient appears euvolemic on exam - No current ACE/ARB/Arni/beta-blocker due to soft blood pressure -Continue midodrine 2.5 mg, 3 times daily p.o. -Creatinine 0.59 today -minimal elevation from 7/30 at 0.46 -Potassium 4.1 today - minimal elevation from 7/30 at 4.3 - Continue to monitor fluid status with daily weights. - As per Dr. and documentation during previous hospitalization, consider addition of Spironolactone at discharge if BMP renal function and potassium allow.  Consider addition of beta-blocker or ACE inhibitor at discharge of blood pressure labs.  Continue LifeVest at discharge.  3.  Hyperlipidemia -  Goal LDL less than 70. -Continue atorvastatin 80 mg daily.  4. DM2 - Per IM   For questions or updates, please contact [place] Please consult [email or web address] for contact info under Cardiology/STEMI.      Dorthula Nettles, PA-C  Pager [phone] 01/26/2018, 8:20 AM

## 2018-01-26 NOTE — ED Notes (Signed)
Attempted to call report; charge nurse to call me back

## 2018-01-27 ENCOUNTER — Encounter: Payer: Self-pay | Admitting: *Deleted

## 2018-01-27 DIAGNOSIS — R0789 Other chest pain: Secondary | ICD-10-CM | POA: Diagnosis not present

## 2018-01-27 LAB — CBC
HEMATOCRIT: 39 % (ref 35.0–47.0)
HEMOGLOBIN: 13.1 g/dL (ref 12.0–16.0)
MCH: 31.7 pg (ref 26.0–34.0)
MCHC: 33.7 g/dL (ref 32.0–36.0)
MCV: 94.1 fL (ref 80.0–100.0)
Platelets: 223 10*3/uL (ref 150–440)
RBC: 4.15 MIL/uL (ref 3.80–5.20)
RDW: 14.2 % (ref 11.5–14.5)
WBC: 7.9 10*3/uL (ref 3.6–11.0)

## 2018-01-27 LAB — GLUCOSE, CAPILLARY
Glucose-Capillary: 337 mg/dL — ABNORMAL HIGH (ref 70–99)
Glucose-Capillary: 324 mg/dL — ABNORMAL HIGH (ref 70–99)
Glucose-Capillary: 98 mg/dL (ref 70–99)
Glucose-Capillary: 277 mg/dL — ABNORMAL HIGH (ref 70–99)

## 2018-01-27 LAB — HEPARIN LEVEL (UNFRACTIONATED): Heparin Unfractionated: 0.1 IU/mL — ABNORMAL LOW (ref 0.30–0.70)

## 2018-01-27 MED ORDER — MIDODRINE HCL 5 MG PO TABS
5.0000 mg | ORAL_TABLET | Freq: Three times a day (TID) | ORAL | Status: DC
  Administered 2018-01-27 – 2018-01-28 (×4): 5 mg via ORAL
  Filled 2018-01-27 (×4): qty 1

## 2018-01-27 MED ORDER — HEPARIN BOLUS VIA INFUSION
1800.0000 [IU] | Freq: Once | INTRAVENOUS | Status: AC
  Administered 2018-01-27: 1800 [IU] via INTRAVENOUS
  Filled 2018-01-27: qty 1800

## 2018-01-27 MED ORDER — OXYCODONE HCL 5 MG PO TABS
5.0000 mg | ORAL_TABLET | Freq: Four times a day (QID) | ORAL | Status: DC | PRN
  Administered 2018-01-27 – 2018-01-28 (×5): 5 mg via ORAL
  Filled 2018-01-27 (×5): qty 1

## 2018-01-27 MED ORDER — INSULIN GLARGINE 100 UNIT/ML ~~LOC~~ SOLN
45.0000 [IU] | Freq: Every day | SUBCUTANEOUS | Status: DC
  Administered 2018-01-27: 45 [IU] via SUBCUTANEOUS
  Filled 2018-01-27 (×2): qty 0.45

## 2018-01-27 MED ORDER — SODIUM CHLORIDE 0.9 % IV BOLUS
500.0000 mL | Freq: Once | INTRAVENOUS | Status: AC
  Administered 2018-01-27: 500 mL via INTRAVENOUS

## 2018-01-27 MED ORDER — INSULIN ASPART 100 UNIT/ML ~~LOC~~ SOLN
10.0000 [IU] | Freq: Three times a day (TID) | SUBCUTANEOUS | Status: DC
  Administered 2018-01-27 – 2018-01-28 (×3): 10 [IU] via SUBCUTANEOUS
  Filled 2018-01-27 (×3): qty 1

## 2018-01-27 NOTE — Evaluation (Addendum)
Occupational Therapy Evaluation Patient Details Name: Renee Mack MRN: [id number] DOB: 12/21/1965 Today's Date: 01/27/2018    History of Present Illness Pt is a 52 y/o F who presented with chest pain, diaphoresis, and SOB which awoke her from sleep.  It was substernal radiating to her posterior neck.  Pt with one episode of nonbloody nonbilious emesis. CT chest neg for PE.  Cervical MRI showed moderate to severe cervical DJD.  Pt has Life Vest pending defib placement. Pt with h/o LAD PCI x3 12/19/17 with stent thrombosis PCI 12/26/17.  Pt with 2 recent admissions for STEMI due to LAD and subsequent stent thrombosis.  Pt is on Midodrine due to h/o hypotension.  [name] additional PMH includes COPD, vulvular cancer, CHF.   Clinical Impression   Pt seen for OT evaluation this date. Prior to hospital admission, pt was independent generally, but requires occasional assist from neighbors to carry items to/from house<>car and for making her bed.  Pt lives by herself in an apartment.  Currently pt demonstrates impairments in significant upper back and neck pain and impaired activity tolerance as well as becoming dizzy with body position changes (+ orthostatics previously today) requiring increased difficulty and pain to perform LB ADL with an increased falls risk. Pt instructed in body positioning, falls prevention, sleeping positioning to minimize back pain and support optimal spinal alignment, and AE for LB ADL to minimize need to bend over. Pt would benefit from skilled HHOT to address noted impairments and functional limitations (see below for any additional details) in order to maximize safety and independence while minimizing falls risk and caregiver burden, including education/training in AE/DME for LB ADL, home safety/set up, modifications and compensatory strategies for ADL and mobility to minimize risk, self mgt of pain, and increase activity tolerance.  Upon hospital discharge, recommend pt discharge to home  with [place]. RNCM notified by phone.    Follow Up Recommendations  Home health OT    Equipment Recommendations  Other (comment)(consider sock aid)    Recommendations for Other Services       Precautions / Restrictions Precautions Precautions: Other (comment) Precaution Comments: h/o hypotension Restrictions Weight Bearing Restrictions: No      Mobility Bed Mobility Overal bed mobility: Independent                Transfers Overall transfer level: Needs assistance Equipment used: None Transfers: Sit to/from Stand Sit to Stand: Supervision              Balance Overall balance assessment: Independent                                         ADL either performed or assessed with clinical judgement   ADL Overall ADL's : Needs assistance/impaired                                       General ADL Comments: increased difficulty and pain to perform ADL, particularly involving bending for LB ADL.      Vision Patient Visual Report: No change from baseline       Perception     Praxis      Pertinent Vitals/Pain Pain Assessment: 0-10 Pain Score: 7  Pain Location: neck, upper back Pain Descriptors / Indicators: Aching Pain Intervention(s): Limited activity within patient's tolerance;Monitored during session;Repositioned  Hand Dominance     Extremity/Trunk Assessment Upper Extremity Assessment Upper Extremity Assessment: Overall WFL for tasks assessed   Lower Extremity Assessment Lower Extremity Assessment: Overall WFL for tasks assessed   Cervical / Trunk Assessment Cervical / Trunk Assessment: Normal   Communication Communication Communication: No difficulties   Cognition Arousal/Alertness: Awake/alert Behavior During Therapy: WFL for tasks assessed/performed Overall Cognitive Status: Within Functional Limits for tasks assessed                                     General Comments        Exercises Other Exercises Other Exercises: Pt educated in falls prevention strategies and strategies to minimize dizziness with body position changes in order to minimize falls risk Other Exercises: Pt educated in performing ADL from seated position and with AE or adapted techniques to minimize need to bend forward and exaccerbate neck/upper back pain   Shoulder Instructions      Home Living Family/patient expects to be discharged to:: Private residence Living Arrangements: Alone Available Help at Discharge: Neighbor;Available PRN/intermittently Type of Home: Apartment Home Access: Level entry(Pt reports she has to walk a long distance from car)     Home Layout: One level     Bathroom Shower/Tub: Teacher, early years/pre: Standard     Home Equipment: Environmental consultant - 2 wheels;Cane - single point;Shower seat;Hand held shower head;Adaptive equipment;Grab bars - toilet;Grab bars - tub/shower Adaptive Equipment: Reacher        Prior Functioning/Environment Level of Independence: Independent        Comments: Pt ambulates without AD. No recent falls.  Pt does reports having assist from neighbor with carrying things and making her bed.  Pt does her own grocery shopping.         OT Problem List: Decreased strength;Decreased knowledge of use of DME or AE;Decreased activity tolerance;Pain      OT Treatment/Interventions: Self-care/ADL training;Therapeutic exercise;Therapeutic activities;Energy conservation;DME and/or AE instruction;Patient/family education    OT Goals(Current goals can be found in the care plan section) Acute Rehab OT Goals Patient Stated Goal: Return to PLOF and have no falls OT Goal Formulation: With patient Time For Goal Achievement: 02/10/18 Potential to Achieve Goals: Good ADL Goals Pt Will Perform Lower Body Dressing: with modified independence;sit to/from stand;with adaptive equipment Additional ADL Goal #1: Pt will verbalize plan to implement at  least 1 learned falls prevention/energy conservation strategy to maximize safety and independence in the home.  OT Frequency: Min 1X/week   Barriers to D/C:            Co-evaluation              AM-PAC PT "6 Clicks" Daily Activity     Outcome Measure Help from another person eating meals?: None Help from another person taking care of personal grooming?: None Help from another person toileting, which includes using toliet, bedpan, or urinal?: None Help from another person bathing (including washing, rinsing, drying)?: A Little Help from another person to put on and taking off regular upper body clothing?: None Help from another person to put on and taking off regular lower body clothing?: A Little 6 Click Score: 22   End of Session    Activity Tolerance: Patient tolerated treatment well Patient left: in bed;with call bell/phone within reach  OT Visit Diagnosis: Other abnormalities of gait and mobility (R26.89);Pain Pain - Right/Left: Right(both sides) Pain - part  of body: Shoulder(upper back, neck, into shoulders)                Time: 7121-9758 OT Time Calculation (min): 12 min Charges:  OT General Charges $OT Visit: 1 Visit OT Evaluation $OT Eval Low Complexity: 1 Low  Jeni Salles, MPH, MS, OTR/L ascom [phone] 01/27/18, 3:02 PM

## 2018-01-27 NOTE — Progress Notes (Signed)
Inpatient Diabetes Program Recommendations  AACE/ADA: New Consensus Statement on Inpatient Glycemic Control (2015)  Target Ranges:  Prepandial:   less than 140 mg/dL      Peak postprandial:   less than 180 mg/dL (1-2 hours)      Critically ill patients:  140 - 180 mg/dL   Results for [name], [name] (MRN [id number]) as of 01/27/2018 10:17  Ref. Range 01/26/2018 08:16 01/26/2018 12:05 01/26/2018 16:46 01/26/2018 21:13  Glucose-Capillary Latest Ref Range: 70 - 99 mg/dL 416 (H) 394 (H)  6 units NOVOLOG  377 (H)  20 units NOVOLOG  297 (H)    25 units LANTUS   Results for CARELI, [name] (MRN [id number]) as of 01/27/2018 10:17  Ref. Range 01/27/2018 07:44  Glucose-Capillary Latest Ref Range: 70 - 99 mg/dL 337 (H)  15 units NOVOLOG     Admit with: CP  History: DM  Home DM Meds: Lantus 60 units QHS       Novolog 10 units TID       Novolog SSI  Current Orders: Lantus 25 units QHS      Novolog Resistant Correction Scale/ SSI (0-20 units) TID AC       MD- Please consider the following in-hospital insulin adjustments:  1. Increase Lantus to 45 units QHS (75% total home dose)  2. Start Novolog Meal Coverage: Novolog 10 units TID with meals (Please add the following Hold Parameters: Hold if pt eats <50% of meal, Hold if pt NPO)  Patient takes Novolog 10 units TID with meals + Novolog SSi at home per records      --Will follow patient during hospitalization--  Wyn Quaker RN, MSN, CDE Diabetes Coordinator Inpatient Glycemic Control Team Team Pager: [phone] (8a-5p)

## 2018-01-27 NOTE — Progress Notes (Signed)
ANTICOAGULATION CONSULT NOTE - Initial Consult  Pharmacy Consult for heparin drip Indication: chest pain/ACS  Allergies  Allergen Reactions  . Bee Venom Itching, Shortness Of Breath and Swelling  . Metformin And Related Shortness Of Breath  . Darvon [Propoxyphene] Itching  . Gabapentin Swelling  . Nsaids Other (See Comments)    Ulcers   . Tramadol Hives    Patient Measurements: Height: 5\' 1"  (154.9 cm) Weight: 136 lb (61.7 kg) IBW/kg (Calculated) : 47.8 Heparin Dosing Weight: 59 kg  Vital Signs: Temp: 97.7 F (36.5 C) (08/09 0407) Temp Source: Oral (08/09 0407) BP: 86/58 (08/09 0409) Pulse Rate: 60 (08/09 0409)  Labs: Recent Labs    01/26/18 0503 01/26/18 0825 01/26/18 1302 01/26/18 2058 01/27/18 0540  HGB 14.2  --   --   --  13.1  HCT 41.1  --   --   --  39.0  PLT 251  --   --   --  223  APTT 27  --   --   --   --   LABPROT 12.1  --   --   --   --   INR 0.90  --   --   --   --   HEPARINUNFRC  --   --  0.52  --  0.10*  CREATININE 0.59  --   --   --   --   TROPONINI <0.03 <0.03 <0.03 <0.03  --     Estimated Creatinine Clearance: 67.8 mL/min (by C-G formula based on SCr of 0.59 mg/dL).   Medical History: Past Medical History:  Diagnosis Date  . Cancer ([place])    vulvular  . Chronic combined systolic (congestive) and diastolic (congestive) heart failure ([place])    a. 12/2017 Echo: EF 30-35%, mid-apicalanteroseptal, ant, and apical AK. Gr1 DD. Mild conc LVH.  Marland Kitchen COPD (chronic obstructive pulmonary disease) ([facility])    not on home oxygen  . Coronary artery disease    a. 12/2017 ACS/PCI: LM nl, LAD 100p (2.25x26 Resolute Onyx DES), 53m (2.0x12 Resolute Onyx DES), 80d (2.0x15 Resolute Onyx DES), LCX 30p, RCA 90p (non-dominant). EF 25-35%. Post-MI course complicated by cardiogenic shock req vasopressor Rx; b. 12/2017 NSTEMI/subacute thrombosis-->LAD 100 (PTCA + DES x 1).  . Diabetes mellitus without complication ([place])   . GERD (gastroesophageal reflux disease)   . HLD  (hyperlipidemia)   . Ischemic cardiomyopathy    a. 12/2017 Echo: EF 30-35%.  . Myocardial infarction ([place])    a. 12/2017-->DES to LAD x 3.  . Pancreatitis   . Shingles   . Tobacco abuse     Medications:  No anticoag in PTA meds.  Assessment: Pt presents with chest pain. She has a long cardiac hx. Troponin have been neg. VQ scan is low probability of VTE. Pharmacy consulted for heparin drip   Goal of Therapy:  Heparin level 0.3-0.7 units/ml Monitor platelets by anticoagulation protocol: Yes   Plan:  Heparin level therapeutic x 2. Will order heparin level for the AM along with CBC.  8/9 AM heparin level 0.10. Per RN infusing as ordered. 1800 unit bolus and increase rate to 900 units/hr. Recheck in 6 hours.  Sim Boast, PharmD, BCPS  01/27/18 6:32 AM

## 2018-01-27 NOTE — Progress Notes (Signed)
Progress Note  Patient Name: Renee Mack Date of Encounter: 01/27/2018  Primary Cardiologist: [name], MD   Subjective   No chest pain, no trouble swallowing  Inpatient Medications    Scheduled Meds: . aspirin EC  81 mg Oral Daily  . atorvastatin  80 mg Oral q1800  . citalopram  20 mg Oral Daily  . cyclobenzaprine  5 mg Oral TID  . digoxin  0.125 mg Oral Daily  . docusate sodium  100 mg Oral BID  . insulin aspart  0-20 Units Subcutaneous TID WC  . insulin glargine  25 Units Subcutaneous QHS  . LORazepam  1 mg Oral Q8H  . midodrine  2.5 mg Oral TID WC  . nicotine  14 mg Transdermal Q24H  . ranolazine  500 mg Oral BID  . ticagrelor  90 mg Oral BID   Continuous Infusions:  PRN Meds: acetaminophen **OR** acetaminophen, albuterol, morphine injection, ondansetron **OR** ondansetron (ZOFRAN) IV   Vital Signs    Vitals:   01/27/18 0407 01/27/18 0409 01/27/18 0639 01/27/18 0743  BP:  (!) 86/58 (!) 119/58 105/65  Pulse: 61 60 72 72  Resp: 17  17 18   Temp: 97.7 F (36.5 C)   97.7 F (36.5 C)  TempSrc: Oral   Oral  SpO2: 93% 94% 94% 91%  Weight: 61.7 kg     Height:        Intake/Output Summary (Last 24 hours) at 01/27/2018 0855 Last data filed at 01/27/2018 0400 Gross per 24 hour  Intake 414.13 ml  Output 2450 ml  Net -2035.87 ml   Filed Weights   01/26/18 0508 01/26/18 0810 01/27/18 0407  Weight: 59 kg 60.8 kg 61.7 kg    Telemetry    NSR - Personally Reviewed  ECG    8/8/ -NSR, anterior lateral TWI - Personally Reviewed  Physical Exam   GEN: No acute distress.   Neck: No JVD, no bruit Cardiac: RRR, no murmurs, rubs, or gallops.  Respiratory: decreased breath sounds C/W COPD GI: Soft, nontender, non-distended  MS: No edema; No deformity. LLE DP 3+/4 Neuro:  Nonfocal  Psych: Normal affect   Labs    Chemistry Recent Labs  Lab 01/26/18 0503  NA 133*  K 4.1  CL 99  CO2 23  GLUCOSE 492*  BUN 11  CREATININE 0.59  CALCIUM 9.4  GFRNONAA  >60  GFRAA >60  ANIONGAP 11     Hematology Recent Labs  Lab 01/26/18 0503 01/27/18 0540  WBC 10.1 7.9  RBC 4.34 4.15  HGB 14.2 13.1  HCT 41.1 39.0  MCV 94.7 94.1  MCH 32.6 31.7  MCHC 34.5 33.7  RDW 14.4 14.2  PLT 251 223    Cardiac Enzymes Recent Labs  Lab 01/26/18 0503 01/26/18 0825 01/26/18 1302 01/26/18 2058  TROPONINI <0.03 <0.03 <0.03 <0.03   No results for input(s): TROPIPOC in the last 168 hours.   BNPNo results for input(s): BNP, PROBNP in the last 168 hours.   DDimer No results for input(s): DDIMER in the last 168 hours.   Radiology    Dg Chest 2 View  Result Date: 01/26/2018 CLINICAL DATA:  Acute onset of substernal chest pain, chest pressure, and neck, arm and back pressure. Nausea and shortness of breath. EXAM: CHEST - 2 VIEW COMPARISON:  Chest radiograph performed 01/15/2018 FINDINGS: The lungs are well-aerated. Minimal left basilar atelectasis is noted. There is no evidence of pleural effusion or pneumothorax. The heart is normal in size; the mediastinal contour is  within normal limits. No acute osseous abnormalities are seen. IMPRESSION: Minimal left basilar atelectasis noted; lungs otherwise clear. Electronically Signed   By: Garald Balding M.D.   On: 01/26/2018 05:58   Ct Angio Chest Pe W Or Wo Contrast  Result Date: 01/26/2018 CLINICAL DATA:  Chest pain and short of breath last night EXAM: CT ANGIOGRAPHY CHEST WITH CONTRAST TECHNIQUE: Multidetector CT imaging of the chest was performed using the standard protocol during bolus administration of intravenous contrast. Multiplanar CT image reconstructions and MIPs were obtained to evaluate the vascular anatomy. CONTRAST:  62mL ISOVUE-370 IOPAMIDOL (ISOVUE-370) INJECTION 76% COMPARISON:  None. FINDINGS: Cardiovascular: There are no filling defects in the pulmonary arterial tree to suggest acute pulmonary thromboembolism. There is soft irregular and calcified atherosclerotic plaque in the aortic arch without  luminal narrowing. There is no obvious aortic dissection or intramural hematoma. No evidence of aneurysmal dilatation of the ascending aorta. LAD territory coronary artery calcifications are present. Mediastinum/Nodes: No abnormal mediastinal adenopathy. Calcified left hilar nodes are present compatible with old granulomatous disease. Thyroid is unremarkable. Esophagus is within normal limits. There is a high density object at the gastroesophageal junction which may represent a small pill. Lungs/Pleura: No pneumothorax or pleural effusion. Dependent atelectasis in the lungs. Moderate centrilobular emphysema towards the lung apices. Upper Abdomen: No acute abnormality. Musculoskeletal: No vertebral compression deformity. Slight scoliosis in the thoracic spine. Posterior osteophytic ridging at C6-7 in the lower cervical spine. Review of the MIP images confirms the above findings. IMPRESSION: No evidence of acute pulmonary thromboembolism. Aortic Atherosclerosis (ICD10-I70.0) and Emphysema (ICD10-J43.9). Electronically Signed   By: Marybelle Killings M.D.   On: 01/26/2018 12:54   Mr Cervical Spine Wo Contrast  Result Date: 01/26/2018 CLINICAL DATA:  52 y/o  F; neck pain, initial exam. EXAM: MRI CERVICAL SPINE WITHOUT CONTRAST TECHNIQUE: Multiplanar, multisequence MR imaging of the cervical spine was performed. No intravenous contrast was administered. COMPARISON:  None. FINDINGS: Alignment: Physiologic. Vertebrae: No fracture, evidence of discitis, or bone lesion. Cord: Normal signal and morphology. Posterior Fossa, vertebral arteries, paraspinal tissues: Negative. Disc levels: C2-3: No significant disc displacement, foraminal stenosis, or canal stenosis. C3-4: Mild disc osteophyte complex with left-greater-than-right uncovertebral and facet hypertrophy. Mild left foraminal stenosis. No significant canal stenosis. C4-5: Mild disc osteophyte complex with bilateral uncovertebral and facet hypertrophy. Mild bilateral  foraminal stenosis. No significant canal stenosis. C5-6: Moderate disc osteophyte complex with right greater than left uncovertebral and facet hypertrophy. Moderate to severe right and moderate left foraminal stenosis. Mild canal stenosis with disc contact on the right anterior cord and mild cord flattening. C6-7: Moderate disc osteophyte complex, left central disc protrusion with annular fissure, bilateral uncovertebral hypertrophy, and bilateral facet hypertrophy. Moderate bilateral foraminal stenosis. Moderate canal stenosis. Central cord impingement with cord flattening. C7-T1: No significant disc displacement, foraminal stenosis, or canal stenosis. Bilateral facet hypertrophy. IMPRESSION: 1. No acute osseous or cord signal abnormality. 2. Cervical spondylosis with multilevel disc and facet degenerative changes greatest at C5-6 and C6-7 levels. 3. Moderate to severe right C5-6, moderate left C5-6, and moderate bilateral C6-7 foraminal stenosis. Multilevel mild foraminal stenosis. 4. Mild C5-6 and moderate C6-7 canal stenosis. C6-7 central cord impingement with cord flattening. Electronically Signed   By: Kristine Garbe M.D.   On: 01/26/2018 20:22    Cardiac Studies     Patient Profile     52 y.o. female s/p LAD PCI with DES x 3 12/19/17. ISR pLAD treated with PCI 12/26/17. She has residula 90% small RCA,  combined CHF, ICM-EF 30-35%, COPD, and DM. Admitted 01/26/18 with recurrent SSCP. Troponin negative x 4.   Assessment & Plan    Chest pain- MI ruled out. Chest CT noted "a high density object at the gastroesophageal junction which may represent a small pill" which could account for admission symptoms. She also has moderate to severe C-spine DJD which could account for her chest pain.  CAD- LAD PCI x 3 12/19/17 with stent thrombosis-PCI 12/26/17.  ICM- EF 30-35%- she has a Life Vest  Hypotension- This has limited medical Rx. She is not on a beta blocker or ACE. She is on Midodrine.    COPD- Quit smoking 12/19/17  Cervical DJD- See MRI  Plan: Consider OP GI evaluation if she develops trouble swallowing (no problems this am). Treatment of DJD per PCP. Keep her 8/15 office appointment with Angelica Ran NP, hopefully will be able to add beta blocker as an OP.    [facility] will sign off.   Medication Recommendations:   Other recommendations (labs, testing, etc):  Follow up as an outpatient:  C. Sharolyn Douglas 02/02/18 -1:15  For questions or updates, please contact [place] Please consult [email or web address] for contact info under Cardiology/STEMI.      SignedKerin Ransom, PA-C  01/27/2018, 8:55 AM

## 2018-01-27 NOTE — Progress Notes (Signed)
Patient ID: Renee Mack, female   DOB: Feb 13, 1966, 52 y.o.   MRN: [id number]  [facility] PROGRESS NOTE  Kataleena Holsapple JME:[id number] DOB: 07-Apr-1966 DOA: 01/26/2018 PCP: Janalyn Shy, MD  HPI/Subjective: Patient feels okay.  Patient lives alone.  Patient was orthostatic with walking.  No complaints of swallowing issues.  Objective: Vitals:   01/27/18 0743 01/27/18 1629  BP: 105/65 (!) 96/58  Pulse: 72 72  Resp: 18 18  Temp: 97.7 F (36.5 C) 98.2 F (36.8 C)  SpO2: 91% 96%    Intake/Output Summary (Last 24 hours) at 01/27/2018 1702 Last data filed at 01/27/2018 1021 Gross per 24 hour  Intake 600 ml  Output 1550 ml  Net -950 ml   Filed Weights   01/26/18 0508 01/26/18 0810 01/27/18 0407  Weight: 59 kg 60.8 kg 61.7 kg    ROS: Review of Systems  Constitutional: Negative for chills and fever.  Eyes: Negative for blurred vision.  Respiratory: Negative for cough and shortness of breath.   Cardiovascular: Negative for chest pain.  Gastrointestinal: Negative for abdominal pain, constipation, diarrhea, nausea and vomiting.  Genitourinary: Negative for dysuria.  Musculoskeletal: Positive for neck pain. Negative for joint pain.  Neurological: Negative for dizziness and headaches.   Exam: Physical Exam  HENT:  Nose: No mucosal edema.  Mouth/Throat: No oropharyngeal exudate or posterior oropharyngeal edema.  Eyes: Pupils are equal, round, and reactive to light. Conjunctivae, EOM and lids are normal.  Neck: No JVD present. Carotid bruit is not present. No edema present. No thyroid mass and no thyromegaly present.  Cardiovascular: S1 normal and S2 normal. Exam reveals no gallop.  No murmur heard. Pulses:      Dorsalis pedis pulses are 2+ on the right side, and 2+ on the left side.  Respiratory: No respiratory distress. She has no wheezes. She has no rhonchi. She has no rales.  GI: Soft. Bowel sounds are normal. There is no tenderness.  Musculoskeletal:       Right shoulder: She  exhibits no swelling.  Lymphadenopathy:    She has no cervical adenopathy.  Neurological: She is alert. No cranial nerve deficit.  Skin: Skin is warm. No rash noted. Nails show no clubbing.  Psychiatric: She has a normal mood and affect.      Data Reviewed: Basic Metabolic Panel: Recent Labs  Lab 01/26/18 0503  NA 133*  K 4.1  CL 99  CO2 23  GLUCOSE 492*  BUN 11  CREATININE 0.59  CALCIUM 9.4   CBC: Recent Labs  Lab 01/26/18 0503 01/27/18 0540  WBC 10.1 7.9  HGB 14.2 13.1  HCT 41.1 39.0  MCV 94.7 94.1  PLT 251 223   Cardiac Enzymes: Recent Labs  Lab 01/26/18 0503 01/26/18 0825 01/26/18 1302 01/26/18 2058  TROPONINI <0.03 <0.03 <0.03 <0.03   BNP (last 3 results) Recent Labs    12/27/17 0506  BNP 714.0*     CBG: Recent Labs  Lab 01/26/18 1646 01/26/18 2113 01/27/18 0744 01/27/18 1120 01/27/18 1630  GLUCAP 377* 297* 337* 324* 98     Studies: Dg Chest 2 View  Result Date: 01/26/2018 CLINICAL DATA:  Acute onset of substernal chest pain, chest pressure, and neck, arm and back pressure. Nausea and shortness of breath. EXAM: CHEST - 2 VIEW COMPARISON:  Chest radiograph performed 01/15/2018 FINDINGS: The lungs are well-aerated. Minimal left basilar atelectasis is noted. There is no evidence of pleural effusion or pneumothorax. The heart is normal in size; the mediastinal contour is within  normal limits. No acute osseous abnormalities are seen. IMPRESSION: Minimal left basilar atelectasis noted; lungs otherwise clear. Electronically Signed   By: Garald Balding M.D.   On: 01/26/2018 05:58   Ct Angio Chest Pe W Or Wo Contrast  Result Date: 01/26/2018 CLINICAL DATA:  Chest pain and short of breath last night EXAM: CT ANGIOGRAPHY CHEST WITH CONTRAST TECHNIQUE: Multidetector CT imaging of the chest was performed using the standard protocol during bolus administration of intravenous contrast. Multiplanar CT image reconstructions and MIPs were obtained to evaluate  the vascular anatomy. CONTRAST:  15mL ISOVUE-370 IOPAMIDOL (ISOVUE-370) INJECTION 76% COMPARISON:  None. FINDINGS: Cardiovascular: There are no filling defects in the pulmonary arterial tree to suggest acute pulmonary thromboembolism. There is soft irregular and calcified atherosclerotic plaque in the aortic arch without luminal narrowing. There is no obvious aortic dissection or intramural hematoma. No evidence of aneurysmal dilatation of the ascending aorta. LAD territory coronary artery calcifications are present. Mediastinum/Nodes: No abnormal mediastinal adenopathy. Calcified left hilar nodes are present compatible with old granulomatous disease. Thyroid is unremarkable. Esophagus is within normal limits. There is a high density object at the gastroesophageal junction which may represent a small pill. Lungs/Pleura: No pneumothorax or pleural effusion. Dependent atelectasis in the lungs. Moderate centrilobular emphysema towards the lung apices. Upper Abdomen: No acute abnormality. Musculoskeletal: No vertebral compression deformity. Slight scoliosis in the thoracic spine. Posterior osteophytic ridging at C6-7 in the lower cervical spine. Review of the MIP images confirms the above findings. IMPRESSION: No evidence of acute pulmonary thromboembolism. Aortic Atherosclerosis (ICD10-I70.0) and Emphysema (ICD10-J43.9). Electronically Signed   By: Marybelle Killings M.D.   On: 01/26/2018 12:54   Mr Cervical Spine Wo Contrast  Result Date: 01/26/2018 CLINICAL DATA:  52 y/o  F; neck pain, initial exam. EXAM: MRI CERVICAL SPINE WITHOUT CONTRAST TECHNIQUE: Multiplanar, multisequence MR imaging of the cervical spine was performed. No intravenous contrast was administered. COMPARISON:  None. FINDINGS: Alignment: Physiologic. Vertebrae: No fracture, evidence of discitis, or bone lesion. Cord: Normal signal and morphology. Posterior Fossa, vertebral arteries, paraspinal tissues: Negative. Disc levels: C2-3: No significant disc  displacement, foraminal stenosis, or canal stenosis. C3-4: Mild disc osteophyte complex with left-greater-than-right uncovertebral and facet hypertrophy. Mild left foraminal stenosis. No significant canal stenosis. C4-5: Mild disc osteophyte complex with bilateral uncovertebral and facet hypertrophy. Mild bilateral foraminal stenosis. No significant canal stenosis. C5-6: Moderate disc osteophyte complex with right greater than left uncovertebral and facet hypertrophy. Moderate to severe right and moderate left foraminal stenosis. Mild canal stenosis with disc contact on the right anterior cord and mild cord flattening. C6-7: Moderate disc osteophyte complex, left central disc protrusion with annular fissure, bilateral uncovertebral hypertrophy, and bilateral facet hypertrophy. Moderate bilateral foraminal stenosis. Moderate canal stenosis. Central cord impingement with cord flattening. C7-T1: No significant disc displacement, foraminal stenosis, or canal stenosis. Bilateral facet hypertrophy. IMPRESSION: 1. No acute osseous or cord signal abnormality. 2. Cervical spondylosis with multilevel disc and facet degenerative changes greatest at C5-6 and C6-7 levels. 3. Moderate to severe right C5-6, moderate left C5-6, and moderate bilateral C6-7 foraminal stenosis. Multilevel mild foraminal stenosis. 4. Mild C5-6 and moderate C6-7 canal stenosis. C6-7 central cord impingement with cord flattening. Electronically Signed   By: Kristine Garbe M.D.   On: 01/26/2018 20:22    Scheduled Meds: . aspirin EC  81 mg Oral Daily  . atorvastatin  80 mg Oral q1800  . citalopram  20 mg Oral Daily  . cyclobenzaprine  5 mg Oral TID  .  digoxin  0.125 mg Oral Daily  . docusate sodium  100 mg Oral BID  . insulin aspart  0-20 Units Subcutaneous TID WC  . insulin aspart  10 Units Subcutaneous TID WC  . insulin glargine  45 Units Subcutaneous QHS  . LORazepam  1 mg Oral Q8H  . midodrine  5 mg Oral TID WC  . nicotine  14  mg Transdermal Q24H  . ranolazine  500 mg Oral BID  . ticagrelor  90 mg Oral BID   Continuous Infusions:  Assessment/Plan:  1. Orthostatic hypotension.  Give fluid bolus today and increased midodrine to 3 times a day. 2. Cervical radiculopathy.  Unable to give steroids secondary to diabetes.  Unable to give anti-inflammatories secondary to ulcer disease.  Continue aspirin and muscle relaxant.  Appreciate physical therapy consultation. 3. Chest pain.  Likely musculoskeletal in nature.  CT scan of the chest negative for pulmonary embolism.  Cardiac enzymes negative.  Seen in consultation by cardiology.  Continue her usual cardiac meds of aspirin atorvastatin Brilinta and Ranexa 4. Type 2 diabetes mellitus increase glargine insulin to 45 units and 10 units 3 times daily with meals 5. Hyperlipidemia unspecified on atorvastatin 6. Depression on Celexa 7. Tobacco abuse on nicotine patch 8. History of COPD.   9. Pill seen in the esophagus on CT scan of the chest.  Patient states she has no problem swallowing and she was given pills right before they did the CAT scan.  Code Status:     Code Status Orders  (From admission, onward)         Start     Ordered   01/26/18 0812  Full code  Continuous     01/26/18 0811        Code Status History    Date Active Date Inactive Code Status Order ID Comments User Context   01/15/2018 1342 01/17/2018 1617 Full Code [id number]  Demetrios Loll, MD Inpatient   12/26/2017 1538 12/30/2017 1928 Full Code [id number]  Wellington Hampshire, MD Inpatient   12/26/2017 1537 12/26/2017 1538 Full Code [id number]  Vaughan Basta, MD Inpatient   12/19/2017 0839 12/21/2017 1940 Full Code [id number]  Wellington Hampshire, MD Inpatient   11/03/2017 1031 11/04/2017 1850 Full Code [id number]  Fritzi Mandes, MD Inpatient   09/28/2017 1044 09/29/2017 2121 Full Code [id number]  Harrie Foreman, MD ED   07/17/2017 2004 07/20/2017 1840 Full Code [id number]  Henreitta Leber, MD ED   06/12/2017 2227  06/16/2017 1910 Full Code [id number]  Gorden Harms, MD Inpatient   12/28/2014 0305 12/28/2014 1919 Full Code [id number]  Lance Coon, MD Inpatient      Disposition Plan: Home tomorrow  Consultants:  Cardiology  Time spent: 28 minutes  [place]

## 2018-01-27 NOTE — Evaluation (Signed)
Physical Therapy Evaluation Patient Details Name: Renee Mack MRN: [id number] DOB: [birth year] Today's Date: 01/27/2018   History of Present Illness  Pt is a 52 y/o F who presented with chest pain, diaphoresis, and SOB which awoke her from sleep.  It was substernal radiating to her posterior neck.  Pt with one episode of nonbloody nonbilious emesis. CT chest neg for PE.  Cervical MRI showed moderate to severe cervical DJD.  Pt has Life Vest pending defib placement. Pt with h/o LAD PCI x3 12/19/17 with stent thrombosis PCI 12/26/17.  Pt with 2 recent admissions for STEMI due to LAD and subsequent stent thrombosis.  Pt is on Midodrine due to h/o hypotension.  [name] additional PMH includes COPD, vulvular cancer, CHF.    Clinical Impression  Pt is a pleasant 52 year old female who was admitted for chest pain. Pt performs bed mobility, transfers, and ambulation with supervision to CGA. Pt demonstrates deficits with unsteadiness that is likely tied to dizziness and orthostatic hypotension. Pt complained of dizziness at rest that worsens with change of position. Pts BP decreases as low as 80/60 sitting EOB. Pts BP does seem to acclimate to positions over period of time, increasing as position is maintained. See below for exact BP details. O2 saturation and HR remain appropriate during change of position indicating that dizziness is likely associated with BP. Pt amb 25' total with CGA and appears guarded and hesitant. Pt states that dizziness is 7/10 with amb, pt does state that she feels at baseline with mobility. Pt left up in chair with BP reading 100/60. Pt does mention that she has history of feeling like things get stuck in her throat which is significant due to pill stuck in gastroesophageal junction noted during CT. Pt spoke with speech therapist who states that due to location of pill on imaging that it is likely a GI impairment opposed to swallowing. Dr. Leslye Peer and RN made aware of above. PT recommends a  follow up with GI. PTs impairments at this time appear medical in nature due to hypotension causing dizziness not true physical impairments. PT does not recommend any PT follow up and recommends d/c home when deemed medically appropriate. PT plans to d/c pt in house. If further PT needs arise please indicate so through a new order. This entire session was guided, instructed, and directly supervised by Collie Siad, DPT.      Follow Up Recommendations No PT follow up    Equipment Recommendations  None recommended by PT    Recommendations for Other Services       Precautions / Restrictions Precautions Precautions: Other (comment) Precaution Comments: h/o hypotension Restrictions Weight Bearing Restrictions: No      Mobility  Bed Mobility Overal bed mobility: Independent             General bed mobility comments: Pt performs bed mobility with independence safely  Transfers Overall transfer level: Needs assistance Equipment used: None Transfers: Sit to/from Stand Sit to Stand: Supervision         General transfer comment: Pt transfers sit<>stand without physical assistance or LOB requiring only close supervision  Ambulation/Gait Ambulation/Gait assistance: Min guard Gait Distance (Feet): 25 Feet Assistive device: None Gait Pattern/deviations: Step-through pattern;Shuffle     General Gait Details: Pt amb 25' total with slow step through gait. Pt appears generally guard and hesistant however states that her mobility is at [place]  Modified Rankin (Stroke Patients Only)       Balance Overall balance assessment: Independent(Appears generally guarded, no LOB without UE support)                                           Pertinent Vitals/Pain Pain Assessment: 0-10 Pain Score: 7  Pain Location: Bil calf, Bil arms, neck Pain Descriptors / Indicators: Aching Pain Intervention(s): Limited  activity within patient's tolerance;Monitored during session    Home Living Family/patient expects to be discharged to:: Private residence Living Arrangements: Alone Available Help at Discharge: Neighbor;Available PRN/intermittently Type of Home: Apartment Home Access: Level entry(Pt reports she has to walk a long distance from car)     Home Layout: One level Home Equipment: Walker - 2 wheels;Cane - single point      Prior Function Level of Independence: Independent         Comments: Pt ambulates without AD. No recent falls.  Pt does reports having assist from neighbor with carrying things and making her bed.  Pt does her own grocery shopping.       Hand Dominance        Extremity/Trunk Assessment   Upper Extremity Assessment Upper Extremity Assessment: Overall WFL for tasks assessed(Grossly at least 4+/5)    Lower Extremity Assessment Lower Extremity Assessment: Overall WFL for tasks assessed(Grossly at least 4+/5)       Communication   Communication: No difficulties  Cognition Arousal/Alertness: Awake/alert Behavior During Therapy: WFL for tasks assessed/performed Overall Cognitive Status: Within Functional Limits for tasks assessed                                 General Comments: A & O x4      General Comments General comments (skin integrity, edema, etc.): [name] BP in supine at start of session 92/57 HR 83, SpO2 94%, sitting EOB 80/60 HR 82 SpO2 92%, again EOB after sitting several minutes 94/60, in standing BP 81/58 and HR 84, sitting EOB due to dizziness 95/62, again in standing 89/57 with 7-8/10 dizziness reported.  Pt performed marching in standing with BP following 95/60, HR 91.  Pt ambulated in room with BP in sitting following 100/60, HR 79 with dizziness 4-5/10.      Exercises Other Exercises Other Exercises: Ortho static hypotension testing    Assessment/Plan    PT Assessment Patient needs continued PT services;Patent does not need  any further PT services  PT Problem List         PT Treatment Interventions      PT Goals (Current goals can be found in the Care Plan section)  Acute Rehab PT Goals Patient Stated Goal: Return to PLOF and have no falls PT Goal Formulation: With patient Time For Goal Achievement: 02/10/18 Potential to Achieve Goals: Good    Frequency     Barriers to discharge        Co-evaluation               AM-PAC PT "6 Clicks" Daily Activity  Outcome Measure Difficulty turning over in bed (including adjusting bedclothes, sheets and blankets)?: None Difficulty moving from lying on back to sitting on the side of the bed? : None Difficulty sitting down on and standing up from a chair with arms (e.g., wheelchair, bedside commode, etc,.)?: None Help needed moving  to and from a bed to chair (including a wheelchair)?: None Help needed walking in hospital room?: None Help needed climbing 3-5 steps with a railing? : A Little 6 Click Score: 23    End of Session Equipment Utilized During Treatment: Gait belt Activity Tolerance: (pt limited by dizziness and orthostatics) Patient left: in chair;with call bell/phone within reach;with chair alarm set Nurse Communication: Mobility status;Other (comment)(orthostatics) PT Visit Diagnosis: Unsteadiness on feet (R26.81)    Time: 7026-3785 PT Time Calculation (min) (ACUTE ONLY): 34 min   Charges:   PT Evaluation $PT Eval Low Complexity: 1 Low PT Treatments $Therapeutic Activity: 8-22 mins        [employer], SPT   Daiquan Resnik 01/27/2018, 2:07 PM

## 2018-01-28 LAB — GLUCOSE, CAPILLARY
Glucose-Capillary: 311 mg/dL — ABNORMAL HIGH (ref 70–99)
Glucose-Capillary: 187 mg/dL — ABNORMAL HIGH (ref 70–99)

## 2018-01-28 MED ORDER — RANOLAZINE ER 500 MG PO TB12: 500.0000 mg | ORAL_TABLET | Freq: Two times a day (BID) | ORAL | 0 refills | Status: DC

## 2018-01-28 MED ORDER — OXYCODONE HCL 5 MG PO TABS: 5.0000 mg | ORAL_TABLET | Freq: Three times a day (TID) | ORAL | 0 refills | Status: DC | PRN

## 2018-01-28 NOTE — Discharge Summary (Signed)
[place] at [facility], 52 y.o., DOB Jan 25, 1966, MRN [id number]. Admission date: 01/26/2018 Discharge Date 01/28/2018 Primary MD Ro, [name], MD Admitting Physician Harrie Foreman, MD  Admission Diagnosis  Unstable angina [facility]) [I20.0]  Discharge Diagnosis   Active Problems: Orthostatic hypotension Cervical radiculopathy Musculoskeletal chest pain Type 2 diabetes Hyperlipidemia Depression Tobacco abuse History of COPD  Hospital Course  Patient is a 52 year old who recently had a stent placed presented to the emergency room with chest pain.  Patient had recently been admitted with atypical chest pain.  She also was noted to have orthostatic hypotension.  She was admitted to the hospital and observed.  Cardiology saw the patient felt that this was noncardiac chest pain.  For her orthostatic hypotension her midodrine was adjusted.  Patient's blood pressure is better.  She is doing well.  Stable for discharge.           Consults  cardiology  Significant Tests:  See full reports for all details     Dg Chest 2 View  Result Date: 01/26/2018 CLINICAL DATA:  Acute onset of substernal chest pain, chest pressure, and neck, arm and back pressure. Nausea and shortness of breath. EXAM: CHEST - 2 VIEW COMPARISON:  Chest radiograph performed 01/15/2018 FINDINGS: The lungs are well-aerated. Minimal left basilar atelectasis is noted. There is no evidence of pleural effusion or pneumothorax. The heart is normal in size; the mediastinal contour is within normal limits. No acute osseous abnormalities are seen. IMPRESSION: Minimal left basilar atelectasis noted; lungs otherwise clear. Electronically Signed   By: Garald Balding M.D.   On: 01/26/2018 05:58   Ct Angio Chest Pe W Or Wo Contrast  Result Date: 01/26/2018 CLINICAL DATA:  Chest pain and short of breath last night EXAM: CT ANGIOGRAPHY CHEST WITH CONTRAST TECHNIQUE: Multidetector CT imaging of the  chest was performed using the standard protocol during bolus administration of intravenous contrast. Multiplanar CT image reconstructions and MIPs were obtained to evaluate the vascular anatomy. CONTRAST:  78mL ISOVUE-370 IOPAMIDOL (ISOVUE-370) INJECTION 76% COMPARISON:  None. FINDINGS: Cardiovascular: There are no filling defects in the pulmonary arterial tree to suggest acute pulmonary thromboembolism. There is soft irregular and calcified atherosclerotic plaque in the aortic arch without luminal narrowing. There is no obvious aortic dissection or intramural hematoma. No evidence of aneurysmal dilatation of the ascending aorta. LAD territory coronary artery calcifications are present. Mediastinum/Nodes: No abnormal mediastinal adenopathy. Calcified left hilar nodes are present compatible with old granulomatous disease. Thyroid is unremarkable. Esophagus is within normal limits. There is a high density object at the gastroesophageal junction which may represent a small pill. Lungs/Pleura: No pneumothorax or pleural effusion. Dependent atelectasis in the lungs. Moderate centrilobular emphysema towards the lung apices. Upper Abdomen: No acute abnormality. Musculoskeletal: No vertebral compression deformity. Slight scoliosis in the thoracic spine. Posterior osteophytic ridging at C6-7 in the lower cervical spine. Review of the MIP images confirms the above findings. IMPRESSION: No evidence of acute pulmonary thromboembolism. Aortic Atherosclerosis (ICD10-I70.0) and Emphysema (ICD10-J43.9). Electronically Signed   By: Marybelle Killings M.D.   On: 01/26/2018 12:54   Mr Cervical Spine Wo Contrast  Result Date: 01/26/2018 CLINICAL DATA:  52 y/o  F; neck pain, initial exam. EXAM: MRI CERVICAL SPINE WITHOUT CONTRAST TECHNIQUE: Multiplanar, multisequence MR imaging of the cervical spine was performed. No intravenous contrast was administered. COMPARISON:  None. FINDINGS: Alignment: Physiologic. Vertebrae: No fracture, evidence  of discitis, or bone lesion. Cord: Normal signal and morphology.  Posterior Fossa, vertebral arteries, paraspinal tissues: Negative. Disc levels: C2-3: No significant disc displacement, foraminal stenosis, or canal stenosis. C3-4: Mild disc osteophyte complex with left-greater-than-right uncovertebral and facet hypertrophy. Mild left foraminal stenosis. No significant canal stenosis. C4-5: Mild disc osteophyte complex with bilateral uncovertebral and facet hypertrophy. Mild bilateral foraminal stenosis. No significant canal stenosis. C5-6: Moderate disc osteophyte complex with right greater than left uncovertebral and facet hypertrophy. Moderate to severe right and moderate left foraminal stenosis. Mild canal stenosis with disc contact on the right anterior cord and mild cord flattening. C6-7: Moderate disc osteophyte complex, left central disc protrusion with annular fissure, bilateral uncovertebral hypertrophy, and bilateral facet hypertrophy. Moderate bilateral foraminal stenosis. Moderate canal stenosis. Central cord impingement with cord flattening. C7-T1: No significant disc displacement, foraminal stenosis, or canal stenosis. Bilateral facet hypertrophy. IMPRESSION: 1. No acute osseous or cord signal abnormality. 2. Cervical spondylosis with multilevel disc and facet degenerative changes greatest at C5-6 and C6-7 levels. 3. Moderate to severe right C5-6, moderate left C5-6, and moderate bilateral C6-7 foraminal stenosis. Multilevel mild foraminal stenosis. 4. Mild C5-6 and moderate C6-7 canal stenosis. C6-7 central cord impingement with cord flattening. Electronically Signed   By: Kristine Garbe M.D.   On: 01/26/2018 20:22   Ct Abdomen Pelvis W Contrast  Result Date: 01/05/2018 CLINICAL DATA:  Bilateral lower quadrant abdomen pain. EXAM: CT ABDOMEN AND PELVIS WITH CONTRAST TECHNIQUE: Multidetector CT imaging of the abdomen and pelvis was performed using the standard protocol following bolus  administration of intravenous contrast. CONTRAST:  138mL OMNIPAQUE IOHEXOL 300 MG/ML  SOLN COMPARISON:  Oct 27, 2017 FINDINGS: Lower chest: No acute abnormality. Hepatobiliary: There is mild diffuse low density of the liver without vessel displacement. No focal liver lesion is identified. The gallbladder is normal. There is dilatation of the common bowel duct measuring 8.5 mm unchanged compared prior CT. Pancreas: Unremarkable. No pancreatic ductal dilatation or surrounding inflammatory changes. Spleen: Normal in size without focal abnormality. Adrenals/Urinary Tract: The adrenal glands are normal. There is focal scar of the upper pole left kidney. No renal lesion is identified. There is no hydronephrosis bilaterally. The bladder is partially decompressed without focal abnormality. Stomach/Bowel: There is a small hiatal hernia. The stomach is otherwise normal. There is no small bowel obstruction or diverticulitis. The appendix is normal. There is focal anterior herniation of the transverse colon without evidence of incarceration unchanged compared to prior CT. Vascular/Lymphatic: Aortic atherosclerosis. No enlarged abdominal or pelvic lymph nodes. Reproductive: Status post hysterectomy. No adnexal masses. Other: Umbilical herniation of mesenteric fat is identified. Musculoskeletal: No acute or significant osseous findings. IMPRESSION: No bowel obstruction. No evidence of diverticulitis. Focal herniation of transverse colon in the midline anterior abdomen without evidence of incarceration, unchanged compared prior CT. Fatty infiltration of liver. Focal scar of left kidney. Electronically Signed   By: Abelardo Diesel M.D.   On: 01/05/2018 16:32   Dg Chest Port 1 View  Result Date: 01/15/2018 CLINICAL DATA:  Chest pain EXAM: PORTABLE CHEST 1 VIEW COMPARISON:  07/17/2017 chest radiograph. FINDINGS: Stable cardiomediastinal silhouette with normal heart size. No pneumothorax. No pleural effusion. Lungs appear clear, with  no acute consolidative airspace disease and no pulmonary edema. IMPRESSION: No active disease. Electronically Signed   By: Ilona Sorrel M.D.   On: 01/15/2018 10:50       Today   Subjective:   Renee Mack patient doing well denies any complaints currently Objective:   Blood pressure 110/64, pulse 77, temperature 98.5 F (36.9 C), temperature  source Oral, resp. rate 18, height 5\' 1"  (1.549 m), weight 62.3 kg, SpO2 98 %.  .  Intake/Output Summary (Last 24 hours) at 01/28/2018 1447 Last data filed at 01/28/2018 0600 Gross per 24 hour  Intake 240 ml  Output 1700 ml  Net -1460 ml    Exam VITAL SIGNS: Blood pressure 110/64, pulse 77, temperature 98.5 F (36.9 C), temperature source Oral, resp. rate 18, height 5\' 1"  (1.549 m), weight 62.3 kg, SpO2 98 %.  GENERAL:  52 y.o.-year-old patient lying in the bed with no acute distress.  EYES: Pupils equal, round, reactive to light and accommodation. No scleral icterus. Extraocular muscles intact.  HEENT: Head atraumatic, normocephalic. Oropharynx and nasopharynx clear.  NECK:  Supple, no jugular venous distention. No thyroid enlargement, no tenderness.  LUNGS: Normal breath sounds bilaterally, no wheezing, rales,rhonchi or crepitation. No use of accessory muscles of respiration.  CARDIOVASCULAR: S1, S2 normal. No murmurs, rubs, or gallops.  ABDOMEN: Soft, nontender, nondistended. Bowel sounds present. No organomegaly or mass.  EXTREMITIES: No pedal edema, cyanosis, or clubbing.  NEUROLOGIC: Cranial nerves II through XII are intact. Muscle strength 5/5 in all extremities. Sensation intact. Gait not checked.  PSYCHIATRIC: The patient is alert and oriented x 3.  SKIN: No obvious rash, lesion, or ulcer.   Data Review     CBC w Diff:  Lab Results  Component Value Date   WBC 7.9 01/27/2018   HGB 13.1 01/27/2018   HGB 16.1 (H) 04/19/2014   HCT 39.0 01/27/2018   HCT 48.7 (H) 04/19/2014   PLT 223 01/27/2018   PLT 318 04/19/2014    LYMPHOPCT 42 12/12/2017   MONOPCT 4 12/12/2017   EOSPCT 3 12/12/2017   BASOPCT 1 12/12/2017   CMP:  Lab Results  Component Value Date   NA 133 (L) 01/26/2018   NA 129 (L) 04/19/2014   K 4.1 01/26/2018   K 4.2 04/19/2014   CL 99 01/26/2018   CL 100 04/19/2014   CO2 23 01/26/2018   CO2 19 (L) 04/19/2014   BUN 11 01/26/2018   BUN 9 04/19/2014   CREATININE 0.59 01/26/2018   CREATININE 0.71 04/19/2014   PROT 8.6 (H) 01/05/2018   PROT 8.6 (H) 04/19/2014   ALBUMIN 4.3 01/05/2018   ALBUMIN 3.8 04/19/2014   BILITOT 0.6 01/05/2018   BILITOT 0.5 04/19/2014   ALKPHOS 140 (H) 01/05/2018   ALKPHOS 91 04/19/2014   AST 16 01/05/2018   AST 22 04/19/2014   ALT 27 01/05/2018   ALT 19 04/19/2014  .  Micro Results No results found for this or any previous visit (from the past 240 hour(s)).      Code Status Orders  (From admission, onward)         Start     Ordered   01/26/18 0812  Full code  Continuous     01/26/18 0811        Code Status History    Date Active Date Inactive Code Status Order ID Comments User Context   01/15/2018 1342 01/17/2018 1617 Full Code [id number]  Demetrios Loll, MD Inpatient   12/26/2017 1538 12/30/2017 1928 Full Code [id number]  Wellington Hampshire, MD Inpatient   12/26/2017 1537 12/26/2017 1538 Full Code [id number]  Vaughan Basta, MD Inpatient   12/19/2017 0839 12/21/2017 1940 Full Code [id number]  Wellington Hampshire, MD Inpatient   11/03/2017 1031 11/04/2017 1850 Full Code [id number]  Fritzi Mandes, MD Inpatient   09/28/2017 1044 09/29/2017 2121 Full Code [id number]  Rosilyn Mings  S, MD ED   07/17/2017 2004 07/20/2017 1840 Full Code [id number]  Henreitta Leber, MD ED   06/12/2017 2227 06/16/2017 1910 Full Code [id number]  Gorden Harms, MD Inpatient   12/28/2014 0305 12/28/2014 1919 Full Code [id number]  Lance Coon, MD Inpatient          Follow-up Information    Ro, [name], MD Follow up in 6 day(s).   Specialty:  Family Medicine Contact information: [address] [address] [address] [phone]        Wellington Hampshire, MD .   Specialty:  Cardiology Contact information: [place] [address] [phone]           Discharge Medications   Allergies as of 01/28/2018      Reactions   Bee Venom Itching, Shortness Of Breath, Swelling   Metformin And Related Shortness Of Breath   Darvon [propoxyphene] Itching   Gabapentin Swelling   Nsaids Other (See Comments)   Ulcers   Tramadol Hives      Medication List    STOP taking these medications   midodrine 2.5 MG tablet Commonly known as:  PROAMATINE     TAKE these medications   acetaminophen 325 MG tablet Commonly known as:  TYLENOL Take 650 mg by mouth every 6 (six) hours as needed for mild pain.   aspirin 81 MG tablet Take 81 mg by mouth daily.   atorvastatin 80 MG tablet Commonly known as:  LIPITOR Take 1 tablet (80 mg total) by mouth daily at 6 PM.   citalopram 20 MG tablet Commonly known as:  CELEXA Take 20 mg daily by mouth.   cyclobenzaprine 10 MG tablet Commonly known as:  FLEXERIL Take 1 tablet (10 mg total) by mouth 3 (three) times daily as needed for muscle spasms.   digoxin 0.125 MG tablet Commonly known as:  LANOXIN Take 1 tablet (0.125 mg total) by mouth daily.   insulin aspart 100 UNIT/ML injection Commonly known as:  novoLOG Inject 10 Units into the skin 3 (three) times daily before meals. Take 10 units PLUS sliding scale   insulin glargine 100 UNIT/ML injection Commonly known as:  LANTUS Inject 60 Units into the skin at bedtime.   LORazepam 1 MG tablet Commonly known as:  ATIVAN Take 1 mg by mouth every 8 (eight) hours.   nicotine 14 mg/24hr patch Commonly known as:  NICODERM CQ - dosed in mg/24 hours Place 1 patch (14 mg total) onto the skin daily. Okay to substitute generic patch   ondansetron 4 MG tablet Commonly known as:  ZOFRAN Take 1 tablet (4 mg total) by mouth daily as  needed.   oxyCODONE 5 MG immediate release tablet Commonly known as:  Oxy IR/ROXICODONE Take 1 tablet (5 mg total) by mouth every 8 (eight) hours as needed for moderate pain or severe pain.   PROAIR HFA 108 (90 Base) MCG/ACT inhaler Generic drug:  albuterol Inhale 1 puff into the lungs every 6 (six) hours as needed for wheezing or shortness of breath.   ranolazine 500 MG 12 hr tablet Commonly known as:  RANEXA Take 1 tablet (500 mg total) by mouth 2 (two) times daily.   ticagrelor 90 MG Tabs tablet Commonly known as:  BRILINTA Take 1 tablet (90 mg total) by mouth 2 (two) times daily.          Total Time in preparing paper work, data evaluation and todays exam - 35 minutes  Dustin Flock M.D on 01/28/2018 at 2:47 PM [employer]   Office  [phone]

## 2018-01-28 NOTE — Progress Notes (Signed)
Discharge instructions explained to pt/ verbalized an understanding/ ivs and tele removed/ RX given to pt/ will transport off unit when ride arrives.

## 2018-02-02 ENCOUNTER — Ambulatory Visit: Payer: Medicaid Other | Admitting: Cardiovascular Disease

## 2018-02-02 ENCOUNTER — Encounter: Payer: Self-pay | Admitting: Nurse Practitioner

## 2018-02-02 ENCOUNTER — Ambulatory Visit (INDEPENDENT_AMBULATORY_CARE_PROVIDER_SITE_OTHER): Payer: Medicaid Other | Admitting: Nurse Practitioner

## 2018-02-02 ENCOUNTER — Encounter

## 2018-02-02 VITALS — BP 118/66 | HR 73 | Ht 61.0 in | Wt 140.2 lb

## 2018-02-02 DIAGNOSIS — E785 Hyperlipidemia, unspecified: Secondary | ICD-10-CM

## 2018-02-02 DIAGNOSIS — I5022 Chronic systolic (congestive) heart failure: Secondary | ICD-10-CM | POA: Diagnosis not present

## 2018-02-02 DIAGNOSIS — I2510 Atherosclerotic heart disease of native coronary artery without angina pectoris: Secondary | ICD-10-CM

## 2018-02-02 DIAGNOSIS — F419 Anxiety disorder, unspecified: Secondary | ICD-10-CM

## 2018-02-02 DIAGNOSIS — I255 Ischemic cardiomyopathy: Secondary | ICD-10-CM | POA: Diagnosis not present

## 2018-02-02 MED ORDER — LORAZEPAM 1 MG PO TABS: 1.0000 mg | ORAL_TABLET | Freq: Three times a day (TID) | ORAL | 0 refills | Status: DC

## 2018-02-02 MED ORDER — DIGOXIN 125 MCG PO TABS: 0.1250 mg | ORAL_TABLET | Freq: Every day | ORAL | 3 refills | Status: DC

## 2018-02-02 NOTE — Patient Instructions (Signed)
Medication Instructions:  Your physician recommends that you continue on your current medications as directed. Please refer to the Current Medication list given to you today.   Labwork: Your physician recommends that you return for lab work in: [place] (LIPID, LIVER, DIGOXIN). Please go to the [facility]. You will check in at the front desk to the right as you walk into the atrium. Valet Parking is offered if needed.  You will need to be FASTING.  Testing/Procedures: NONE  Follow-Up: Your physician recommends that you schedule a follow-up appointment in: [place] [facility].   If you need a refill on your cardiac medications before your next appointment, please call your pharmacy.

## 2018-02-02 NOTE — Progress Notes (Signed)
Office Visit    Patient Name: Renee Mack Date of Encounter: 02/02/2018  Primary Care Provider:  Janalyn Shy, MD Primary Cardiologist:  [name], MD  Chief Complaint    52 year old female with a history of CAD status post recent myocardial infarction and drug-eluting stent placement to the LAD x3 with subsequent readmission for subacute stent thrombosis and repeat PCI, combined CHF/ischemic cardia myopathy with an EF of 30 to 35%, tobacco abuse, COPD, hyperlipidemia, GERD, type 2 diabetes mellitus, and pancreatitis, who presents for follow-up after 2 recent hospitalizations for neck and shoulder pain.  Past Medical History    Past Medical History:  Diagnosis Date  . Cancer ([place])    vulvular  . Cervical disc disease    a. 01/2018 MRI Cervical spine: Cervical spondylosis with multilevel disc and facet degeneration greatest at C5-6 and C6-7.  Moderate to sev R C5-6, mod L C5-6, and mod bilat C6-7 foraminal stenosis with multilevel mild foraminal stenosis.  Mild C5-6 and mod C6-7 canal stenosis. C6-7 central cord impingement w/ cord flattening.  . Chronic combined systolic (congestive) and diastolic (congestive) heart failure ([place])    a. 12/2017 Echo: EF 30-35%, mid-apicalanteroseptal, ant, and apical AK. Gr1 DD. Mild conc LVH.  Marland Kitchen COPD (chronic obstructive pulmonary disease) ([facility])    not on home oxygen  . Coronary artery disease    a. 12/2017 ACS/PCI: LM nl, LAD 100p (2.25x26 Resolute Onyx DES), 57m (2.0x12 Resolute Onyx DES), 80d (2.0x15 Resolute Onyx DES), LCX 30p, RCA 90p (non-dominant). EF 25-35%. Post-MI course complicated by cardiogenic shock req vasopressor Rx; b. 12/2017 NSTEMI/subacute thrombosis-->LAD 100 (PTCA + DES x 1).  . Diabetes mellitus without complication ([place])   . GERD (gastroesophageal reflux disease)   . HLD (hyperlipidemia)   . Ischemic cardiomyopathy    a. 12/2017 Echo: EF 30-35%.  . Myocardial infarction ([place])    a. 12/2017-->DES to LAD x 3.  .  Pancreatitis   . Shingles   . Tobacco abuse    Past Surgical History:  Procedure Laterality Date  . ABDOMINAL HYSTERECTOMY     partial  . CORONARY STENT INTERVENTION N/A 12/26/2017   Procedure: CORONARY STENT INTERVENTION;  Surgeon: Wellington Hampshire, MD;  Location: [facility];  Service: Cardiovascular;  Laterality: N/A;  . CORONARY/GRAFT ACUTE MI REVASCULARIZATION N/A 12/19/2017   Procedure: Coronary/Graft Acute MI Revascularization;  Surgeon: Wellington Hampshire, MD;  Location: [facility];  Service: Cardiovascular;  Laterality: N/A;  . ECTOPIC PREGNANCY SURGERY Left   . LEFT HEART CATH AND CORONARY ANGIOGRAPHY N/A 12/19/2017   Procedure: LEFT HEART CATH AND CORONARY ANGIOGRAPHY;  Surgeon: Wellington Hampshire, MD;  Location: [facility];  Service: Cardiovascular;  Laterality: N/A;  . LEFT HEART CATH AND CORONARY ANGIOGRAPHY N/A 12/26/2017   Procedure: LEFT HEART CATH AND CORONARY ANGIOGRAPHY;  Surgeon: Wellington Hampshire, MD;  Location: [facility];  Service: Cardiovascular;  Laterality: N/A;  . LYMPHADENECTOMY    . SPLENECTOMY, PARTIAL    . vulvulasectomy      Allergies  Allergies  Allergen Reactions  . Bee Venom Itching, Shortness Of Breath and Swelling  . Metformin And Related Shortness Of Breath  . Darvon [Propoxyphene] Itching  . Gabapentin Swelling  . Nsaids Other (See Comments)    Ulcers   . Tramadol Hives    History of Present Illness    52 year old female with a history of CAD status post recent myocardial infarction with drug-eluting stent placement to  the LAD x3, combined CHF/ischemic cardia myopathy with an EF of 30 to 35%, tobacco abuse, COPD, hyperlipidemia, GERD, type 2 diabetes mellitus, and pancreatitis.  She was admitted to [facility] in early July with a several day history of chest pain radiating to her neck and arm.  She was found to have elevated troponins and subsequently underwent catheterization revealing severe proximal,  mid, and distal LAD disease along with severe disease in a non-dominant right coronary artery.  EF was 25 to 35%.  She underwent drug-eluting stent placement to the LAD x3.  Post catheterization course was complicated by cardiogenic shock and hypotension requiring vasopressors.  Follow-up echo showed an EF of 30 to 35%.  She was discharged on July 3 with a LifeVest in place.  She return to the emergency department on July 8 with recurrent chest pain radiating to her neck, back, and down her arms.  She also had nausea, vomiting, and dyspnea.  Repeat catheterization revealed a total occlusion of the proximal to mid LAD with large thrombus burden.  The LAD was successfully treated with PTCA and also a drug-eluting stent to the mid LAD.  She was subsequently discharged and seen in clinic July 23, at which time she reported a few episodes of left upper chest/neck/shoulder discomfort without associated symptoms occurring exclusively when lying in bed at night.  Continue medical therapy was recommended.  She was admitted to [facility] July 28 due to neck and shoulder pain.  Troponins were normal and medical therapy was recommended.  She was readmitted August 8 with similar symptoms and again, troponins were normal and medical therapy was recommended.  MRI of the cervical spine was performed during that admission and showed cervical spondylosis with multilevel disc and facet degeneration greatest at C5-6 and C6-7.  She also had moderate to severe right C5-6, moderate left C5-6, and moderate bilateral C6-7 foraminal stenosis with multilevel mild foraminal stenosis.  Mild C5-6 and moderate C6-7 canal stenosis was noted, along with C6-7 central cord impingement with cord flattening.  It was felt that perhaps neck and shoulder pain related to these findings.  She was subsequently discharged.  Since discharge, she has continued to have intermittent neck and shoulder pain.  This mostly occurs when she is lying in bed.   She has not had any significant dyspnea though notes that she is trying to avoid any significant exertion.  She denies PND, orthopnea, dizziness, syncope, palpitations, or early satiety.  She has been weighing herself regularly and says that it has been stable.  She occasionally notes very mild ankle edema.  She has completely changed her diet and has not smoked since her heart attack.    During her most recent hospitalization, she missed a primary care appointment in that setting, was not able to get her Ativan refilled.  She takes 1 mg 3 times daily.  She says that since being off of her Ativan, she has had a lot of anxiety.  She does not have follow-up with primary care until July 30.  Home Medications    Prior to Admission medications   Medication Sig Start Date End Date Taking? Authorizing Provider  acetaminophen (TYLENOL) 325 MG tablet Take 650 mg by mouth every 6 (six) hours as needed for mild pain.     [provider]  albuterol (PROAIR HFA) 108 (90 Base) MCG/ACT inhaler Inhale 1 puff into the lungs every 6 (six) hours as needed for wheezing or shortness of breath.     [provider]  aspirin 81 MG tablet Take 81 mg by mouth daily.    [provider]  atorvastatin (LIPITOR) 80 MG tablet Take 1 tablet (80 mg total) by mouth daily at 6 PM. 01/10/18 03/11/18  Theora Gianotti, NP  citalopram (CELEXA) 20 MG tablet Take 20 mg daily by mouth.    [provider]  cyclobenzaprine (FLEXERIL) 10 MG tablet Take 1 tablet (10 mg total) by mouth 3 (three) times daily as needed for muscle spasms. 01/17/18   Dustin Flock, MD  digoxin (LANOXIN) 0.125 MG tablet Take 1 tablet (0.125 mg total) by mouth daily. 01/10/18   Theora Gianotti, NP  insulin aspart (NOVOLOG) 100 UNIT/ML injection Inject 10 Units into the skin 3 (three) times daily before meals. Take 10 units PLUS sliding scale    [provider]  insulin glargine (LANTUS) 100 UNIT/ML  injection Inject 60 Units into the skin at bedtime.    [provider]  LORazepam (ATIVAN) 1 MG tablet Take 1 mg by mouth every 8 (eight) hours.    [provider]  nicotine (NICODERM CQ - DOSED IN MG/24 HOURS) 14 mg/24hr patch Place 1 patch (14 mg total) onto the skin daily. Okay to substitute generic patch 12/30/17 12/30/18  Loletha Grayer, MD  ondansetron (ZOFRAN) 4 MG tablet Take 1 tablet (4 mg total) by mouth daily as needed. 01/05/18   Schaevitz, Randall An, MD  oxyCODONE (OXY IR/ROXICODONE) 5 MG immediate release tablet Take 1 tablet (5 mg total) by mouth every 8 (eight) hours as needed for moderate pain or severe pain. 01/28/18   Dustin Flock, MD  ranolazine (RANEXA) 500 MG 12 hr tablet Take 1 tablet (500 mg total) by mouth 2 (two) times daily. 01/28/18   Dustin Flock, MD  ticagrelor (BRILINTA) 90 MG TABS tablet Take 1 tablet (90 mg total) by mouth 2 (two) times daily. 01/10/18 03/11/18  Theora Gianotti, NP    Review of Systems    She continues to have intermittent shoulder and neck pain, mostly when lying in bed.  She has not had any chest pain or dyspnea.  She denies PND, orthopnea, dizziness, syncope, palpitations, or early satiety.  She occasionally notes very mild ankle edema.  All other systems reviewed and are otherwise negative except as noted above.  Physical Exam    VS:  BP 118/66 (BP Location: Left Arm, Patient Position: Sitting, Cuff Size: Normal)   Pulse 73   Ht 5\' 1"  (1.549 m)   Wt 140 lb 4 oz (63.6 kg)   BMI 26.50 kg/m  , BMI Body mass index is 26.5 kg/m. GEN: Well nourished, well developed, in no acute distress.  HEENT: normal.  Neck: Supple, no JVD, carotid bruits, or masses. Cardiac: RRR, no murmurs, rubs, or gallops. No clubbing, cyanosis, edema.  Radials/DP/PT 2+ and equal bilaterally.  Respiratory:  Respirations regular and unlabored, clear to auscultation bilaterally. GI: Soft, nontender, nondistended, BS + x 4. MS: no  deformity or atrophy. Skin: warm and dry, no rash. Neuro:  Strength and sensation are intact. Psych: Normal affect.  Accessory Clinical Findings    ECG -regular sinus rhythm, 73, left axis deviation, prior anteroseptal infarct with anterolateral T wave inversion.  No acute changes.  Assessment & Plan    1.  Coronary artery disease: Status post prior anterior STEMI with LAD stenting x3 followed by non-STEMI in the setting of subacute stent thrombosis requiring PTCA and repeat stenting of the proximal mid LAD in early in  mid July.  She has been hospitalized twice since then, both times with neck and shoulder pain.  Troponins were normal on both occasions and medical therapy was recommended.  She has not been having any chest pain or dyspnea.  She still has neck and shoulder pain which at this point is attributed to severe cervical disc disease for which she is followed in pain management clinic.  As previously noted, it is suspected that her anterior wall is completely infarcted and that she may be at high risk for restenosis of the LAD as result.  She also has moderate to severe disease in a small right coronary artery which is supplying collaterals to the anterior wall.  PCI of the RCA would be reserved for recurrent symptoms with objective evidence of ischemia.  She remains on aspirin, statin, Brilinta, and Ranexa therapy.  She is not on beta-blocker secondary to relative hypotension which persists, with blood pressures in the 90s at home.  She is not currently enrolled in cardiac rehab and this has been encouraged.  2.  Ischemic cardiomyopathy/HFrEF: Euvolemic on exam.  She has not had any significant dyspnea. We discussed the importance of daily weights, sodium restriction, medication compliance, and symptom reporting and she verbalizes understanding.  She has really changed her diet and is focusing more on vegetables and as best she can, has removed processed foods from her diet.  Due to relative  hypotension with pressures in the 90s at home, she is not on beta-blocker/ACE inhibitor/ARB/ARNI/MRA.  She remains on digoxin and this will be refilled today.  I will follow-up a digoxin level.  She has a LifeVest in place and will require a repeat echocardiogram in mid October.  3.  Relative hypotension: Blood pressure is 118/60 today.  She says that is actually high for her as she typically runs in the 90s at home.  Asymptomatic.  4.  Tobacco abuse: She quit smoking in July.  She remains on a nicotine patch.  I congratulated her on quitting and encouraged her to remain off of cigarettes.  5.  Type 2 diabetes mellitus: On Lantus per internal medicine.  6.  Cervical disc disease: Recent MRI with significant degeneration and also spinal stenosis.  She is followed in pain management clinic and currently only use as needed Tylenol if pain is severe.  7.  Hyperlipidemia: LDL 72 in July.  Plan to follow-up fasting lipids and LFTs tomorrow.  8.  Anxiety: Patient has significant anxiety, dating back to deaths of multiple family members in a short timeframe last year.  She has been taking Ativan 1 mg 3 times daily and is followed by psychiatry locally.  She missed an appointment with primary care during recent hospitalization and is out of Ativan.  She has follow-up with primary care on August 30.  I agreed to refill her Ativan 1 mg 3 times daily, #45, with 0 refills to get her through to that time.  9.  Disposition: Follow-up fasting lipids, LFTs and digoxin level tomorrow.  Follow-up in clinic in 1 month or sooner if necessary.  She will need a follow-up echocardiogram in mid October.   Murray Hodgkins, NP 02/02/2018, 1:49 PM

## 2018-02-03 ENCOUNTER — Other Ambulatory Visit
Admission: RE | Admit: 2018-02-03 | Discharge: 2018-02-03 | Disposition: A | Discharge status: Home / Self Care | Payer: Medicaid Other | Source: Ambulatory Visit | Attending: Nurse Practitioner | Admitting: Nurse Practitioner

## 2018-02-03 DIAGNOSIS — I255 Ischemic cardiomyopathy: Secondary | ICD-10-CM

## 2018-02-03 DIAGNOSIS — E785 Hyperlipidemia, unspecified: Secondary | ICD-10-CM | POA: Insufficient documentation

## 2018-02-03 DIAGNOSIS — I5022 Chronic systolic (congestive) heart failure: Secondary | ICD-10-CM | POA: Insufficient documentation

## 2018-02-03 LAB — HEPATIC FUNCTION PANEL
ALBUMIN: 4 g/dL (ref 3.5–5.0)
ALK PHOS: 144 U/L — AB (ref 38–126)
ALT: 51 U/L — AB (ref 0–44)
AST: 27 U/L (ref 15–41)
BILIRUBIN DIRECT: 0.1 mg/dL (ref 0.0–0.2)
BILIRUBIN TOTAL: 0.5 mg/dL (ref 0.3–1.2)
Indirect Bilirubin: 0.4 mg/dL (ref 0.3–0.9)
Total Protein: 7.9 g/dL (ref 6.5–8.1)

## 2018-02-03 LAB — LIPID PANEL
CHOLESTEROL: 140 mg/dL (ref 0–200)
HDL: 36 mg/dL — ABNORMAL LOW (ref >40)
LDL Cholesterol: 60 mg/dL (ref 0–99)
TRIGLYCERIDES: 218 mg/dL — AB (ref <150)
Total CHOL/HDL Ratio: 3.9 RATIO
VLDL: 44 mg/dL — AB (ref 0–40)

## 2018-02-03 LAB — DIGOXIN LEVEL: Digoxin Level: 1.2 ng/mL (ref 0.8–2.0)

## 2018-02-06 ENCOUNTER — Other Ambulatory Visit: Payer: Self-pay

## 2018-02-06 ENCOUNTER — Inpatient Hospital Stay
Admission: EM | Admit: 2018-02-06 | Discharge: 2018-02-09 | DRG: 247 | Disposition: A | Discharge status: Home / Self Care | Payer: Medicaid Other | Attending: Internal Medicine | Admitting: Internal Medicine

## 2018-02-06 ENCOUNTER — Emergency Department: Payer: Medicaid Other

## 2018-02-06 DIAGNOSIS — F419 Anxiety disorder, unspecified: Secondary | ICD-10-CM | POA: Diagnosis present

## 2018-02-06 DIAGNOSIS — Z8249 Family history of ischemic heart disease and other diseases of the circulatory system: Secondary | ICD-10-CM

## 2018-02-06 DIAGNOSIS — I252 Old myocardial infarction: Secondary | ICD-10-CM

## 2018-02-06 DIAGNOSIS — J449 Chronic obstructive pulmonary disease, unspecified: Secondary | ICD-10-CM | POA: Diagnosis present

## 2018-02-06 DIAGNOSIS — Y832 Surgical operation with anastomosis, bypass or graft as the cause of abnormal reaction of the patient, or of later complication, without mention of misadventure at the time of the procedure: Secondary | ICD-10-CM | POA: Diagnosis present

## 2018-02-06 DIAGNOSIS — Z955 Presence of coronary angioplasty implant and graft: Secondary | ICD-10-CM

## 2018-02-06 DIAGNOSIS — Z7982 Long term (current) use of aspirin: Secondary | ICD-10-CM

## 2018-02-06 DIAGNOSIS — I255 Ischemic cardiomyopathy: Secondary | ICD-10-CM | POA: Diagnosis present

## 2018-02-06 DIAGNOSIS — I959 Hypotension, unspecified: Secondary | ICD-10-CM | POA: Diagnosis not present

## 2018-02-06 DIAGNOSIS — R079 Chest pain, unspecified: Secondary | ICD-10-CM | POA: Diagnosis present

## 2018-02-06 DIAGNOSIS — Z90710 Acquired absence of both cervix and uterus: Secondary | ICD-10-CM

## 2018-02-06 DIAGNOSIS — Z794 Long term (current) use of insulin: Secondary | ICD-10-CM

## 2018-02-06 DIAGNOSIS — I5042 Chronic combined systolic (congestive) and diastolic (congestive) heart failure: Secondary | ICD-10-CM | POA: Diagnosis present

## 2018-02-06 DIAGNOSIS — E1165 Type 2 diabetes mellitus with hyperglycemia: Secondary | ICD-10-CM | POA: Diagnosis present

## 2018-02-06 DIAGNOSIS — Z79899 Other long term (current) drug therapy: Secondary | ICD-10-CM

## 2018-02-06 DIAGNOSIS — Z87891 Personal history of nicotine dependence: Secondary | ICD-10-CM

## 2018-02-06 DIAGNOSIS — K219 Gastro-esophageal reflux disease without esophagitis: Secondary | ICD-10-CM | POA: Diagnosis present

## 2018-02-06 DIAGNOSIS — T82855A Stenosis of coronary artery stent, initial encounter: Secondary | ICD-10-CM | POA: Diagnosis present

## 2018-02-06 DIAGNOSIS — E785 Hyperlipidemia, unspecified: Secondary | ICD-10-CM | POA: Diagnosis present

## 2018-02-06 DIAGNOSIS — I25110 Atherosclerotic heart disease of native coronary artery with unstable angina pectoris: Principal | ICD-10-CM | POA: Diagnosis present

## 2018-02-06 DIAGNOSIS — M549 Dorsalgia, unspecified: Secondary | ICD-10-CM | POA: Diagnosis present

## 2018-02-06 DIAGNOSIS — Z9081 Acquired absence of spleen: Secondary | ICD-10-CM

## 2018-02-06 DIAGNOSIS — G8929 Other chronic pain: Secondary | ICD-10-CM | POA: Diagnosis present

## 2018-02-06 DIAGNOSIS — R112 Nausea with vomiting, unspecified: Secondary | ICD-10-CM

## 2018-02-06 LAB — BASIC METABOLIC PANEL
ANION GAP: 10 (ref 5–15)
BUN: 11 mg/dL (ref 6–20)
CHLORIDE: 104 mmol/L (ref 98–111)
CO2: 23 mmol/L (ref 22–32)
Calcium: 9.1 mg/dL (ref 8.9–10.3)
Creatinine, Ser: 0.62 mg/dL (ref 0.44–1.00)
Glucose, Bld: 474 mg/dL — ABNORMAL HIGH (ref 70–99)
POTASSIUM: 3.6 mmol/L (ref 3.5–5.1)
SODIUM: 137 mmol/L (ref 135–145)

## 2018-02-06 LAB — CBC
HEMATOCRIT: 38.2 % (ref 35.0–47.0)
HEMOGLOBIN: 13.2 g/dL (ref 12.0–16.0)
MCH: 32.6 pg (ref 26.0–34.0)
MCHC: 34.6 g/dL (ref 32.0–36.0)
MCV: 94.2 fL (ref 80.0–100.0)
Platelets: 250 10*3/uL (ref 150–440)
RBC: 4.05 MIL/uL (ref 3.80–5.20)
RDW: 15 % — AB (ref 11.5–14.5)
WBC: 10.1 10*3/uL (ref 3.6–11.0)

## 2018-02-06 LAB — TROPONIN I: Troponin I: <0.03 ng/mL (ref <0.03)

## 2018-02-06 MED ORDER — FAMOTIDINE IN NACL 20-0.9 MG/50ML-% IV SOLN
20.0000 mg | Freq: Once | INTRAVENOUS | Status: AC
  Administered 2018-02-07: 20 mg via INTRAVENOUS
  Filled 2018-02-06: qty 50

## 2018-02-06 MED ORDER — SODIUM CHLORIDE 0.9 % IV BOLUS
500.0000 mL | Freq: Once | INTRAVENOUS | Status: AC
  Administered 2018-02-07: 500 mL via INTRAVENOUS

## 2018-02-06 MED ORDER — ONDANSETRON HCL 4 MG/2ML IJ SOLN
4.0000 mg | Freq: Once | INTRAMUSCULAR | Status: AC
  Administered 2018-02-07: 4 mg via INTRAVENOUS
  Filled 2018-02-06: qty 2

## 2018-02-06 MED ORDER — MORPHINE SULFATE (PF) 2 MG/ML IV SOLN
2.0000 mg | Freq: Once | INTRAVENOUS | Status: AC
  Administered 2018-02-07: 2 mg via INTRAVENOUS
  Filled 2018-02-06: qty 1

## 2018-02-06 NOTE — ED Triage Notes (Addendum)
Pt brought in by [facility] due to chest pain, sob, nausea and vomiting that started about 3 hours ago. States she has acid reflux sx. Pain radiates down her left arm and to left face. States her left face is numb. She was given 4 mg Zofran IM by EMS. Pt took Tums, Nexium, 1 Nitroglycerin, and 324 mg ASA prior to EMS arrival. Pt had MI (with stent placement) July 1. She now wears lifevest while waiting for more stent placements. Pt was also hyperglycemic (CBG 515 mg/dL) by EMS. Pt has taken her nightly Insulin.

## 2018-02-06 NOTE — ED Notes (Signed)
Pt ambulatory to toilet independently. 

## 2018-02-07 ENCOUNTER — Encounter: Payer: Self-pay | Admitting: Internal Medicine

## 2018-02-07 DIAGNOSIS — I5022 Chronic systolic (congestive) heart failure: Secondary | ICD-10-CM

## 2018-02-07 DIAGNOSIS — I255 Ischemic cardiomyopathy: Secondary | ICD-10-CM

## 2018-02-07 DIAGNOSIS — I200 Unstable angina: Secondary | ICD-10-CM | POA: Diagnosis not present

## 2018-02-07 LAB — HEPATIC FUNCTION PANEL
ALBUMIN: 3.5 g/dL (ref 3.5–5.0)
ALT: 30 U/L (ref 0–44)
AST: 28 U/L (ref 15–41)
Alkaline Phosphatase: 101 U/L (ref 38–126)
Bilirubin, Direct: 0.1 mg/dL (ref 0.0–0.2)
Indirect Bilirubin: 0.4 mg/dL (ref 0.3–0.9)
TOTAL PROTEIN: 6.8 g/dL (ref 6.5–8.1)
Total Bilirubin: 0.5 mg/dL (ref 0.3–1.2)

## 2018-02-07 LAB — TROPONIN I

## 2018-02-07 LAB — GLUCOSE, CAPILLARY
GLUCOSE-CAPILLARY: 349 mg/dL — AB (ref 70–99)
Glucose-Capillary: 303 mg/dL — ABNORMAL HIGH (ref 70–99)
Glucose-Capillary: 291 mg/dL — ABNORMAL HIGH (ref 70–99)
Glucose-Capillary: 193 mg/dL — ABNORMAL HIGH (ref 70–99)

## 2018-02-07 LAB — LIPASE, BLOOD: LIPASE: 18 U/L (ref 11–51)

## 2018-02-07 LAB — DIGOXIN LEVEL: Digoxin Level: <0.2 ng/mL — ABNORMAL LOW (ref 0.8–2.0)

## 2018-02-07 MED ORDER — ENOXAPARIN SODIUM 40 MG/0.4ML ~~LOC~~ SOLN: 40.0000 mg | SUBCUTANEOUS | Status: DC

## 2018-02-07 MED ORDER — CYCLOBENZAPRINE HCL 10 MG PO TABS
10.0000 mg | ORAL_TABLET | Freq: Three times a day (TID) | ORAL | Status: DC | PRN
  Administered 2018-02-08: 10 mg via ORAL
  Filled 2018-02-07: qty 1

## 2018-02-07 MED ORDER — DIGOXIN 125 MCG PO TABS
0.1250 mg | ORAL_TABLET | Freq: Every day | ORAL | Status: DC
  Administered 2018-02-07: 0.125 mg via ORAL
  Filled 2018-02-07 (×2): qty 1

## 2018-02-07 MED ORDER — ONDANSETRON HCL 4 MG/2ML IJ SOLN
4.0000 mg | Freq: Four times a day (QID) | INTRAMUSCULAR | Status: DC | PRN
  Administered 2018-02-07: 4 mg via INTRAVENOUS
  Filled 2018-02-07: qty 2

## 2018-02-07 MED ORDER — LORAZEPAM 1 MG PO TABS
1.0000 mg | ORAL_TABLET | Freq: Three times a day (TID) | ORAL | Status: DC | PRN
  Administered 2018-02-07 – 2018-02-09 (×6): 1 mg via ORAL
  Filled 2018-02-07 (×6): qty 1

## 2018-02-07 MED ORDER — SODIUM CHLORIDE 0.9 % IV SOLN
INTRAVENOUS | Status: DC
  Administered 2018-02-08 (×2): via INTRAVENOUS

## 2018-02-07 MED ORDER — SENNOSIDES-DOCUSATE SODIUM 8.6-50 MG PO TABS: 1.0000 | ORAL_TABLET | Freq: Every evening | ORAL | Status: DC | PRN

## 2018-02-07 MED ORDER — ATORVASTATIN CALCIUM 20 MG PO TABS
80.0000 mg | ORAL_TABLET | Freq: Every day | ORAL | Status: DC
  Administered 2018-02-07: 80 mg via ORAL
  Filled 2018-02-07: qty 4

## 2018-02-07 MED ORDER — CITALOPRAM HYDROBROMIDE 20 MG PO TABS
20.0000 mg | ORAL_TABLET | Freq: Every day | ORAL | Status: DC
  Administered 2018-02-07 – 2018-02-09 (×2): 20 mg via ORAL
  Filled 2018-02-07 (×2): qty 1

## 2018-02-07 MED ORDER — SODIUM CHLORIDE 0.9 % IV SOLN: 250.0000 mL | INTRAVENOUS | Status: DC | PRN

## 2018-02-07 MED ORDER — TICAGRELOR 90 MG PO TABS
90.0000 mg | ORAL_TABLET | Freq: Two times a day (BID) | ORAL | Status: DC
  Administered 2018-02-07 – 2018-02-09 (×4): 90 mg via ORAL
  Filled 2018-02-07 (×4): qty 1

## 2018-02-07 MED ORDER — ASPIRIN 81 MG PO CHEW
81.0000 mg | CHEWABLE_TABLET | Freq: Every day | ORAL | Status: DC
  Administered 2018-02-07 – 2018-02-09 (×2): 81 mg via ORAL
  Filled 2018-02-07 (×2): qty 1

## 2018-02-07 MED ORDER — SODIUM CHLORIDE 0.9% FLUSH
3.0000 mL | Freq: Two times a day (BID) | INTRAVENOUS | Status: DC
  Administered 2018-02-07 (×2): 3 mL via INTRAVENOUS

## 2018-02-07 MED ORDER — ENOXAPARIN SODIUM 40 MG/0.4ML ~~LOC~~ SOLN
40.0000 mg | SUBCUTANEOUS | Status: DC
  Administered 2018-02-07: 40 mg via SUBCUTANEOUS
  Filled 2018-02-07: qty 0.4

## 2018-02-07 MED ORDER — RANOLAZINE ER 500 MG PO TB12
500.0000 mg | ORAL_TABLET | Freq: Two times a day (BID) | ORAL | Status: DC
  Administered 2018-02-07 – 2018-02-09 (×4): 500 mg via ORAL
  Filled 2018-02-07 (×6): qty 1

## 2018-02-07 MED ORDER — BISACODYL 5 MG PO TBEC: 5.0000 mg | DELAYED_RELEASE_TABLET | Freq: Every day | ORAL | Status: DC | PRN

## 2018-02-07 MED ORDER — ACETAMINOPHEN 325 MG PO TABS
650.0000 mg | ORAL_TABLET | ORAL | Status: DC | PRN
  Administered 2018-02-07: 650 mg via ORAL
  Filled 2018-02-07: qty 2

## 2018-02-07 MED ORDER — SODIUM CHLORIDE 0.9% FLUSH: 3.0000 mL | INTRAVENOUS | Status: DC | PRN

## 2018-02-07 MED ORDER — PANTOPRAZOLE SODIUM 40 MG PO TBEC
40.0000 mg | DELAYED_RELEASE_TABLET | Freq: Every day | ORAL | Status: DC
  Administered 2018-02-07 – 2018-02-09 (×2): 40 mg via ORAL
  Filled 2018-02-07 (×2): qty 1

## 2018-02-07 MED ORDER — ALBUTEROL SULFATE (2.5 MG/3ML) 0.083% IN NEBU: 2.5000 mg | INHALATION_SOLUTION | Freq: Four times a day (QID) | RESPIRATORY_TRACT | Status: DC | PRN

## 2018-02-07 MED ORDER — INSULIN ASPART 100 UNIT/ML ~~LOC~~ SOLN
0.0000 [IU] | Freq: Three times a day (TID) | SUBCUTANEOUS | Status: DC
  Administered 2018-02-07: 11 [IU] via SUBCUTANEOUS
  Administered 2018-02-07 – 2018-02-09 (×2): 8 [IU] via SUBCUTANEOUS
  Filled 2018-02-07 (×3): qty 1

## 2018-02-07 MED ORDER — INSULIN ASPART 100 UNIT/ML ~~LOC~~ SOLN
0.0000 [IU] | Freq: Every day | SUBCUTANEOUS | Status: DC
  Administered 2018-02-08: 5 [IU] via SUBCUTANEOUS
  Filled 2018-02-07 (×2): qty 1

## 2018-02-07 MED ORDER — MORPHINE SULFATE (PF) 2 MG/ML IV SOLN
2.0000 mg | INTRAVENOUS | Status: DC | PRN
  Administered 2018-02-07 – 2018-02-09 (×10): 2 mg via INTRAVENOUS
  Filled 2018-02-07 (×10): qty 1

## 2018-02-07 MED ORDER — NITROGLYCERIN 0.4 MG SL SUBL: 0.4000 mg | SUBLINGUAL_TABLET | SUBLINGUAL | Status: DC | PRN

## 2018-02-07 MED ORDER — INSULIN GLARGINE 100 UNIT/ML ~~LOC~~ SOLN
60.0000 [IU] | Freq: Every day | SUBCUTANEOUS | Status: DC
  Administered 2018-02-07 – 2018-02-08 (×2): 60 [IU] via SUBCUTANEOUS
  Filled 2018-02-07 (×4): qty 0.6

## 2018-02-07 NOTE — Consult Note (Signed)
Cardiology Consultation:   Patient ID: Renee Mack; [id number]; 1965-10-09   Admit date: 02/06/2018 Date of Consult: 02/07/2018  Primary Care Provider: Janalyn Shy, MD Primary Cardiologist: Renee Mack   Patient Profile:   Renee Mack is a 52 y.o. female with a hx of CAD status post recent myocardial infarction and drug-eluting stent placement to the LAD x3 with subsequent readmission for subacute stent thrombosis and repeat PCI, combined CHF/ischemic cardiomyopathy with an EF of 30 to 35%, tobacco abuse, COPD, hyperlipidemia, GERD, type 2 diabetes mellitus, and pancreatitis who is being seen today for the evaluation of chest pain at the request of Dr. Jodell Mack.  History of Present Illness:   Renee Mack was admitted to [facility] in early July with a several day history of chest pain radiating to her neck and arm.  She was found to have elevated troponins and subsequently underwent catheterization revealing severe proximal, mid, and distal LAD disease along with severe disease in a non-dominant right coronary artery.  EF was 25 to 35%.  She underwent drug-eluting stent placement to the LAD x3.  Post catheterization course was complicated by cardiogenic shock and hypotension requiring vasopressors.  Follow-up echo showed an EF of 30 to 35%.  She was discharged on July 3 with a LifeVest in place.  She return to the emergency department on July 8 with recurrent chest pain radiating to her neck, back, and down her arms.  She also had nausea, vomiting, and dyspnea.  Repeat catheterization revealed a total occlusion of the proximal to mid LAD with large thrombus burden.  The LAD was successfully treated with PTCA and also a drug-eluting stent to the mid LAD.  Collaterals were noted from the RCA to the LAD.  Given the anterior apical scar related to her late-presentation as well as long segments of stents, there was concern for a high chance that the LAD will close again in the future. It was  recommended if that happens there may not ben much benefit from re-attempting another PCI and it might be best to rely on collaterals from the RCA. In that situation, PCI of the RCA may be indicated in spite of the vessel being nondominant. She was subsequently discharged and seen in clinic July 23, at which time she reported a few episodes of left upper chest/neck/shoulder discomfort without associated symptoms occurring exclusively when lying in bed at night.  Continue medical therapy was recommended.  She was admitted to [facility] July 28 due to neck and shoulder pain.  Troponins were normal and medical therapy was recommended.  She was readmitted August 8 with similar symptoms and again, troponins were normal and medical therapy was recommended.  MRI of the cervical spine was performed during that admission and showed cervical spondylosis with multilevel disc and facet degeneration greatest at C5-6 and C6-7.  She also had moderate to severe right C5-6, moderate left C5-6, and moderate bilateral C6-7 foraminal stenosis with multilevel mild foraminal stenosis.  Mild C5-6 and moderate C6-7 canal stenosis was noted, along with C6-7 central cord impingement with cord flattening.  It was felt that perhaps neck and shoulder pain related to these findings.  She was subsequently discharged. She was seen in follow up on 02/02/18 and continued to note intermittent neck and shoulder pain, mostly occurring while laying in bed.   Patient developed nausea and vomiting with associated what she felt like was heartburn after cleaning up around her house on 8/19. This was followed by chest tightness  that radiated up to her neck and down her left shoulder. Pain felt somewhat similar to her prior episodes. She reports compliance with all medications except for digoxin, which she ran out of a couple days prior. She reports her symptoms did improve somewhat following her most recent admission, though returned on 8/19 as  above.   Upon the patient's arrival to [facility] they were found to have stable vitals. EKG showed NSR, 74 bpm, prior anteroseptal infarct, lateral TWI, CXR showed aortic atherosclerosis without acute process. Labs showed troponin negative x 2, WBC 10.1, HGB 13.2, PLT 250, Na 137, K+ 3.6, SCr 0.62, lipase 18, LFT normal. In the ED and upon admission, the patient was given Pepcid, morphine, and Zofran. Currently, continues to note some nausea without emesis as well as chest tightness. This is improving.   Past Medical History:  Diagnosis Date  . Cancer ([place])    vulvular  . Cervical disc disease    a. 01/2018 MRI Cervical spine: Cervical spondylosis with multilevel disc and facet degeneration greatest at C5-6 and C6-7.  Moderate to sev R C5-6, mod L C5-6, and mod bilat C6-7 foraminal stenosis with multilevel mild foraminal stenosis.  Mild C5-6 and mod C6-7 canal stenosis. C6-7 central cord impingement w/ cord flattening.  . Chronic combined systolic (congestive) and diastolic (congestive) heart failure ([place])    a. 12/2017 Echo: EF 30-35%, mid-apicalanteroseptal, ant, and apical AK. Gr1 DD. Mild conc LVH.  Marland Kitchen COPD (chronic obstructive pulmonary disease) (H[facility])    not on home oxygen  . Coronary artery disease    a. 12/2017 ACS/PCI: LM nl, LAD 100p (2.25x26 Resolute Onyx DES), 6m(2.0x12 Resolute Onyx DES), 80d (2.0x15 Resolute Onyx DES), LCX 30p, RCA 90p (non-dominant). EF 25-35%. Post-MI course complicated by cardiogenic shock req vasopressor Rx; b. 12/2017 NSTEMI/subacute thrombosis-->LAD 100 (PTCA + DES x 1).  . Diabetes mellitus without complication ([place]   . GERD (gastroesophageal reflux disease)   . HLD (hyperlipidemia)   . Ischemic cardiomyopathy    a. 12/2017 Echo: EF 30-35%.  . Myocardial infarction ([place]    a. 12/2017-->DES to LAD x 3.  . Pancreatitis   . Shingles   . Tobacco abuse     Past Surgical History:  Procedure Laterality Date  . ABDOMINAL HYSTERECTOMY     partial  . CORONARY STENT  INTERVENTION N/A 12/26/2017   Procedure: CORONARY STENT INTERVENTION;  Surgeon: AWellington Hampshire MD;  [place];  Service: Cardiovascular;  Laterality: N/A;  . CORONARY/GRAFT ACUTE MI REVASCULARIZATION N/A 12/19/2017   Procedure: Coronary/Graft Acute MI Revascularization;  Surgeon: AWellington Hampshire MD;  [place] [facility];  Service: Cardiovascular;  Laterality: N/A;  . ECTOPIC PREGNANCY SURGERY Left   . LEFT HEART CATH AND CORONARY ANGIOGRAPHY N/A 12/19/2017   Procedure: LEFT HEART CATH AND CORONARY ANGIOGRAPHY;  Surgeon: AWellington Hampshire MD;  [place];  Service: Cardiovascular;  Laterality: N/A;  . LEFT HEART CATH AND CORONARY ANGIOGRAPHY N/A 12/26/2017   Procedure: LEFT HEART CATH AND CORONARY ANGIOGRAPHY;  Surgeon: AWellington Hampshire MD;  [facility];  Service: Cardiovascular;  Laterality: N/A;  . LYMPHADENECTOMY    . SPLENECTOMY, PARTIAL    . vulvulasectomy       Home Meds: Prior to Admission medications   Medication Sig Start Date End Date Taking? Authorizing Provider  acetaminophen (TYLENOL) 325 MG tablet Take 650 mg by mouth every 6 (six) hours as needed for mild pain.  Yes [provider]  albuterol (PROAIR HFA) 108 (90 Base) MCG/ACT inhaler Inhale 1 puff into the lungs every 6 (six) hours as needed for wheezing or shortness of breath.    Yes [provider]  aspirin 81 MG tablet Take 81 mg by mouth daily.   Yes [provider]  atorvastatin (LIPITOR) 80 MG tablet Take 1 tablet (80 mg total) by mouth daily at 6 PM. 01/10/18 03/11/18 Yes Theora Gianotti, NP  citalopram (CELEXA) 20 MG tablet Take 20 mg daily by mouth.   Yes [provider]  cyclobenzaprine (FLEXERIL) 10 MG tablet Take 1 tablet (10 mg total) by mouth 3 (three) times daily as needed for muscle spasms. 01/17/18  Yes Dustin Flock, MD  digoxin (LANOXIN) 0.125 MG tablet Take 1 tablet (0.125 mg total) by mouth  daily. 02/02/18  Yes Theora Gianotti, NP  esomeprazole (NEXIUM) 40 MG capsule Take 40 mg by mouth daily at 12 noon.   Yes [provider]  insulin aspart (NOVOLOG) 100 UNIT/ML injection Inject 10 Units into the skin 3 (three) times daily before meals. Take 10 units PLUS sliding scale   Yes [provider]  insulin glargine (LANTUS) 100 UNIT/ML injection Inject 60 Units into the skin at bedtime.   Yes [provider]  LORazepam (ATIVAN) 1 MG tablet Take 1 tablet (1 mg total) by mouth every 8 (eight) hours. 02/02/18  Yes Theora Gianotti, NP  ondansetron (ZOFRAN) 4 MG tablet Take 1 tablet (4 mg total) by mouth daily as needed. 01/05/18  Yes Schaevitz, Randall An, MD  ranolazine (RANEXA) 500 MG 12 hr tablet Take 1 tablet (500 mg total) by mouth 2 (two) times daily. 01/28/18  Yes Dustin Flock, MD  ticagrelor (BRILINTA) 90 MG TABS tablet Take 1 tablet (90 mg total) by mouth 2 (two) times daily. 01/10/18 03/11/18 Yes Theora Gianotti, NP  oxyCODONE (OXY IR/ROXICODONE) 5 MG immediate release tablet Take 1 tablet (5 mg total) by mouth every 8 (eight) hours as needed for moderate pain or severe pain. Patient not taking: Reported on 02/07/2018 01/28/18   Dustin Flock, MD    Inpatient Medications: Scheduled Meds: . aspirin  81 mg Oral Daily  . atorvastatin  80 mg Oral q1800  . citalopram  20 mg Oral Daily  . digoxin  0.125 mg Oral Daily  . insulin aspart  0-15 Units Subcutaneous TID WC  . insulin aspart  0-5 Units Subcutaneous QHS  . insulin glargine  60 Units Subcutaneous QHS  . pantoprazole  40 mg Oral Daily  . ranolazine  500 mg Oral BID  . ticagrelor  90 mg Oral BID   Continuous Infusions:  PRN Meds: acetaminophen, albuterol, bisacodyl, cyclobenzaprine, LORazepam, morphine injection, nitroGLYCERIN, ondansetron (ZOFRAN) IV, senna-docusate  Allergies:   Allergies  Allergen Reactions  . Bee Venom Itching, Shortness Of Breath and Swelling    . Metformin And Related Shortness Of Breath  . Darvon [Propoxyphene] Itching  . Gabapentin Swelling  . Nsaids Other (See Comments)    Ulcers   . Tramadol Hives    Social History:   Social History   Socioeconomic History  . Marital status: Widowed    Spouse name: Not on file  . Number of children: Not on file  . Years of education: Not on file  . Highest education level: Not on file  Occupational History  . Not on file  Social Needs  . Financial resource strain: Not very hard  . Food insecurity:  Worry: Never true    Inability: Never true  . Transportation needs:    Medical: No    Non-medical: No  Tobacco Use  . Smoking status: Former Smoker    Packs/day: 0.50    Years: 30.00    Pack years: 15.00    Types: Cigarettes  . Smokeless tobacco: Never Used  . Tobacco comment: quit 12/19/2017.  Substance and Sexual Activity  . Alcohol use: No  . Drug use: No  . Sexual activity: Not on file  Lifestyle  . Physical activity:    Days per week: 7 days    Minutes per session: 30 min  . Stress: Very much  Relationships  . Social connections:    Talks on phone: More than three times a week    Gets together: Patient refused    Attends religious service: Never    Active member of club or organization: No    Attends meetings of clubs or organizations: Never    Relationship status: Widowed  . Intimate partner violence:    Fear of current or ex partner: No    Emotionally abused: No    Physically abused: No    Forced sexual activity: No  Other Topics Concern  . Not on file  Social History Narrative   Lives in [place] by herself.  Does not routinely exercise.     Family History:   Family History  Problem Relation Age of Onset  . Cancer Mother   . Osteoporosis Sister   . Heart disease Brother   . Heart attack Father     ROS:  Review of Systems  Constitutional: Positive for malaise/fatigue. Negative for chills, diaphoresis, fever and weight loss.  HENT: Negative  for congestion.   Eyes: Negative for discharge and redness.  Respiratory: Positive for shortness of breath. Negative for cough, hemoptysis, sputum production and wheezing.   Cardiovascular: Positive for chest pain. Negative for palpitations, orthopnea, claudication, leg swelling and PND.  Gastrointestinal: Positive for abdominal pain, heartburn, nausea and vomiting. Negative for blood in stool and melena.  Genitourinary: Negative for hematuria.  Musculoskeletal: Positive for back pain, joint pain, myalgias and neck pain. Negative for falls.  Skin: Negative for rash.  Neurological: Positive for weakness. Negative for dizziness, tingling, tremors, sensory change, speech change, focal weakness and loss of consciousness.  Endo/Heme/Allergies: Does not bruise/bleed easily.  Psychiatric/Behavioral: Negative for substance abuse. The patient is nervous/anxious.   All other systems reviewed and are negative.     Physical Exam/Data:   Vitals:   02/07/18 0800 02/07/18 0945 02/07/18 1000 02/07/18 1032  BP: 131/71 115/70 123/71 (!) 106/58  Pulse: 74 78 80 70  Resp: _0 Temp:    97.9 F (36.6 C)  TempSrc:    Oral  SpO2: 96% 94% 94% 98%  Weight:      Height:        Intake/Output Summary (Last 24 hours) at 02/07/2018 1047 Last data filed at 02/07/2018 0116 Gross per 24 hour  Intake 550 ml  Output -  Net 550 ml   Filed Weights   02/06/18 2234  Weight: 61.2 kg   Body mass index is 25.51 kg/m.   Physical Exam: General: Well developed, well nourished, in no acute distress. Head: Normocephalic, atraumatic, sclera non-icteric, no xanthomas, nares without discharge.  Neck: Negative for carotid bruits. JVD not elevated. Lungs: Clear bilaterally to auscultation without wheezes, rales, or rhonchi. Breathing is unlabored. Heart: RRR with S1 S2. No murmurs, rubs, or gallops  appreciated. Abdomen: Soft, non-tender, non-distended with normoactive bowel sounds. No hepatomegaly. No  rebound/guarding. No obvious abdominal masses. Msk:  Strength and tone appear normal for age. Extremities: No clubbing or cyanosis. No edema. Distal pedal pulses are 2+ and equal bilaterally. Neuro: Alert and oriented X 3. No facial asymmetry. No focal deficit. Moves all extremities spontaneously. Psych:  Responds to questions appropriately with a normal affect.   EKG:  The EKG was personally reviewed and demonstrates: NSR, 74 bpm, prior anteroseptal infarct, lateral TWI Telemetry:  Telemetry was personally reviewed and demonstrates: NSR  Weights: Filed Weights   02/06/18 2234  Weight: 61.2 kg    Relevant CV Studies:  LHC 12/19/2017: Conclusion     Prox Cx lesion is 30% stenosed.  Prox LAD lesion is 100% stenosed.  Mid LAD lesion is 80% stenosed.  Dist LAD lesion is 80% stenosed.  Post intervention, there is a 0% residual stenosis.  A drug-eluting stent was successfully placed using a STENT RESOLUTE ONYX 2.25X26.  Post intervention, there is a 0% residual stenosis.  Post intervention, there is a 10% residual stenosis.  A drug-eluting stent was successfully placed using a STENT RESOLUTE ONYX 2.0X15.  A drug-eluting stent was successfully placed using a STENT RESOLUTE ONYX 2.0X12.  The left ventricular ejection fraction is 25-35% by visual estimate.  Prox RCA lesion is 90% stenosed.   1.  Severe one-vessel coronary artery disease with thrombotic occlusion of the proximal LAD and significant disease in the mid and distal LAD.  Left dominant system.  Significant proximal stenosis in a nondominant right coronary artery. 2.  Moderately to severely reduced LV systolic function with an EF of 30% with akinesis of the mid to distal anterior and apical myocardium. 3.  Moderately elevated left ventricular end-diastolic pressure at 26 mmHg. 4.  Successful angioplasty and 3 drug-eluting stent placement to the LAD to establish TIMI-3 flow.  Recommendations Continue Aggrastat for  12 hours. Dual antiplatelet therapy for at least one year Aggressive treatment of risk factors. Obtain an echocardiogram. Wean off norepinephrine drip and start small dose carvedilol once blood pressure tolerates.  Please note that the initial EKGs did not meet STEMI criteria given that there was ST elevation only in V3.  However, given the patient's continued symptoms and change in her EKG from prior one, we decided to proceed with urgent cardiac catheterization.  __________  Echo 12/19/2017: Study Conclusions  - Left ventricle: The cavity size was normal. There was mild   concentric hypertrophy. Systolic function was moderately to   severely reduced. The estimated ejection fraction was in the   range of 30% to 35%. Akinesis of the mid-apicalanteroseptal,   anterior, and apical myocardium. Doppler parameters are   consistent with abnormal left ventricular relaxation (grade 1   diastolic dysfunction). __________   LHC 12/26/2017: Conclusion     Dist LAD lesion is 10% stenosed.  Balloon angioplasty was performed.  Prox Cx lesion is 30% stenosed.  Prox RCA lesion is 90% stenosed.  Prox LAD to Mid LAD lesion is 100% stenosed.  Mid LAD lesion is 70% stenosed.  A drug-eluting stent was successfully placed using a STENT RESOLUTE ONYX 2.0X18.  Post intervention, there is a 15% residual stenosis.  Post intervention, there is a 10% residual stenosis.  Balloon angioplasty was performed using a BALLOON [place] RX2.5X20.  Post intervention, there is a 0% residual stenosis.  Ost 2nd Diag to 2nd Diag lesion is 90% stenosed.   1.  Subacute stent thrombosis in  the LAD likely due to poor outflow related to previous late presentation of anterior myocardial infarction as well as long segments of stents.  Collaterals are noted from the right coronary artery to the LAD. 2.  Moderately elevated left ventricular end-diastolic pressure. 3.  Successful very difficult angioplasty and  drug-eluting stent placement to the LAD as outlined above.  Recommendations: Given anterior apical scar related to late presentation as well as long segments of stents, there is a high chance that the LAD will close again in the future.  If that happens, there might not be much benefit of attempting another PCI.  Might be best to rely on collaterals from the right coronary artery.  In that situation, PCI of the right coronary artery might be indicated in spite of the vessel being nondominant.  This will be considered before hospital discharge.  Recommend uninterrupted dual antiplatelet therapy with Aspirin 53m daily and Ticagrelor 953mtwice daily for a minimum of 12 months (ACS - Class I recommendation).  Prefer long-term treatment beyond 1 year due to stent thrombosis.  Please note that the patient's EKG did not meet criteria for a STEMI and this was done as an urgent case due to continued chest pain.  _________  Laboratory Data:  Chemistry Recent Labs  Lab 02/06/18 2235  NA 137  K 3.6  CL 104  CO2 23  GLUCOSE 474*  BUN 11  CREATININE 0.62  CALCIUM 9.1  GFRNONAA >60  GFRAA >60  ANIONGAP 10    Recent Labs  Lab 02/03/18 0847 02/06/18 2235  PROT 7.9 6.8  ALBUMIN 4.0 3.5  AST 27 28  ALT 51* 30  ALKPHOS 144* 101  BILITOT 0.5 0.5   Hematology Recent Labs  Lab 02/06/18 2235  WBC 10.1  RBC 4.05  HGB 13.2  HCT 38.2  MCV 94.2  MCH 32.6  MCHC 34.6  RDW 15.0*  PLT 250   Cardiac Enzymes Recent Labs  Lab 02/06/18 2235 02/07/18 0647  TROPONINI <0.03 <0.03   No results for input(s): TROPIPOC in the last 168 hours.  BNPNo results for input(s): BNP, PROBNP in the last 168 hours.  DDimer No results for input(s): DDIMER in the last 168 hours.  Radiology/Studies:  Dg Chest 2 View  Result Date: 02/06/2018 IMPRESSION: No acute pulmonary process identified.  Aortic atherosclerosis. Electronically Signed   By: LaKristine Garbe.D.   On: 02/06/2018 22:58     Assessment and Plan:   1. CAD involving the native coronary arteries with unstable angina: -Currently, notes improving chest tightness with mild nausea -Cycle 3rd troponin to completely rule out -Continue DAPT with ASA 81 mg daily and Brilinta 90 mg bid -Pressures have been somewhat soft limiting addition of beta blocker or isosorbide for anti-anginal effect -Maintain Ranexa at 500 mg bid -Schedule for LHC on 8/21 -May need PCI of the non-dominant RCA as detailed in last cath report  -NPO at midnight -Risks and benefits of cardiac catheterization have been discussed with the patient including risks of bleeding, bruising, infection, kidney damage, stroke, heart attack, and death. The patient understands these risks and is willing to proceed with the procedure. All questions have been answered and concerns listened to  2. HFrEF secondary to ICM: -She does not appear grossly volume up at this time -We have been unable to add evidence-based heart failure therapy including beta blocker, ACEi/ARB/Entresto, and spironolactone given her relative hypotension -Continue digoxin, check a level -Recent echo as above, no need to repeat at this  time -LifeVest with plans for repeat echo in 03/2018  3. HLD: -Recent lipid panel from 02/03/2018 showed an LDL of 60 with mildly elevated alk phos and ALT at that time -Follow up LFT normal this admission -Continue Lipitor 80 mg daily -Decrease high sugar foods and drinks  4. COPD secondary to tobacco abuse: -Per IM  5. DM2: -Poorly controlled -Per IM  6. Anxiety: -Per IM   For questions or updates, please contact [facility] Please consult [email or web address] for contact info under Cardiology/STEMI.   Signed, Christell Faith, PA-C [place] Pager: [phone] 02/07/2018, 10:47 AM

## 2018-02-07 NOTE — Progress Notes (Signed)
Pt resting in bed with no acute distress. Pt denies pain or discomfort. Denies nausea at this time.

## 2018-02-07 NOTE — Progress Notes (Signed)
[facility] [place] NAME: Renee Mack    MR#:  [id number]  DATE OF BIRTH:  1965-10-16  SUBJECTIVE:  CHIEF COMPLAINT:   Chief Complaint  Patient presents with  . Chest Pain  . Nausea  . Vomiting   Extensive history of coronary artery disease, came with chest pain again.  Continued to have complaint of chest pain REVIEW OF SYSTEMS:  CONSTITUTIONAL: No fever, fatigue or weakness.  EYES: No blurred or double vision.  EARS, NOSE, AND THROAT: No tinnitus or ear pain.  RESPIRATORY: No cough, shortness of breath, wheezing or hemoptysis.  CARDIOVASCULAR: have chest pain,no orthopnea, edema.  GASTROINTESTINAL: No nausea, vomiting, diarrhea or abdominal pain.  GENITOURINARY: No dysuria, hematuria.  ENDOCRINE: No polyuria, nocturia,  HEMATOLOGY: No anemia, easy bruising or bleeding SKIN: No rash or lesion. MUSCULOSKELETAL: No joint pain or arthritis.   NEUROLOGIC: No tingling, numbness, weakness.  PSYCHIATRY: No anxiety or depression.   ROS  DRUG ALLERGIES:   Allergies  Allergen Reactions  . Bee Venom Itching, Shortness Of Breath and Swelling  . Metformin And Related Shortness Of Breath  . Darvon [Propoxyphene] Itching  . Gabapentin Swelling  . Nsaids Other (See Comments)    Ulcers   . Tramadol Hives    VITALS:  Blood pressure 115/68, pulse 70, temperature 98 F (36.7 C), temperature source Oral, resp. rate 20, height 5' 1"  (1.549 m), weight 62.3 kg, SpO2 97 %.  PHYSICAL EXAMINATION:  GENERAL:  52 y.o.-year-old patient lying in the bed with no acute distress.  EYES: Pupils equal, round, reactive to light and accommodation. No scleral icterus. Extraocular muscles intact.  HEENT: Head atraumatic, normocephalic. Oropharynx and nasopharynx clear.  NECK:  Supple, no jugular venous distention. No thyroid enlargement, no tenderness.  LUNGS: Normal breath sounds bilaterally, no wheezing, rales,rhonchi or crepitation. No use of accessory  muscles of respiration.  CARDIOVASCULAR: S1, S2 normal. No murmurs, rubs, or gallops.  ABDOMEN: Soft, nontender, nondistended. Bowel sounds present. No organomegaly or mass.  EXTREMITIES: No pedal edema, cyanosis, or clubbing.  NEUROLOGIC: Cranial nerves II through XII are intact. Muscle strength 5/5 in all extremities. Sensation intact. Gait not checked.  PSYCHIATRIC: The patient is alert and oriented x 3.  SKIN: No obvious rash, lesion, or ulcer.   Physical Exam LABORATORY PANEL:   CBC Recent Labs  Lab 02/06/18 2235  WBC 10.1  HGB 13.2  HCT 38.2  PLT 250   ------------------------------------------------------------------------------------------------------------------  Chemistries  Recent Labs  Lab 02/06/18 2235  NA 137  K 3.6  CL 104  CO2 23  GLUCOSE 474*  BUN 11  CREATININE 0.62  CALCIUM 9.1  AST 28  ALT 30  ALKPHOS 101  BILITOT 0.5   ------------------------------------------------------------------------------------------------------------------  Cardiac Enzymes Recent Labs  Lab 02/07/18 0647 02/07/18 1302  TROPONINI <0.03 <0.03   ------------------------------------------------------------------------------------------------------------------  RADIOLOGY:  Dg Chest 2 View  Result Date: 02/06/2018 CLINICAL DATA:  52 y/o F; chest pain, shortness of breath, nausea, and vomiting. EXAM: CHEST - 2 VIEW COMPARISON:  01/26/2018 CT chest and chest radiograph FINDINGS: Stable normal cardiac silhouette. Aortic atherosclerosis with calcification. Electronic device multiple leads partially obscures the lungs. No consolidation identified. No pleural effusion or pneumothorax. No acute osseous abnormality is evident. IMPRESSION: No acute pulmonary process identified.  Aortic atherosclerosis. Electronically Signed   By: Kristine Garbe M.D.   On: 02/06/2018 22:58    ASSESSMENT AND PLAN:   Active Problems:   Chest pain  *  CAD involving  the native coronary  arteries with unstable angina: - 3 troponin negative. -Continue ASA 81 mg daily and Brilinta 90 mg bid -Pressures have been somewhat soft limiting addition of beta blocker or isosorbide for anti-anginal effect -Maintain Ranexa at 500 mg bid -Schedule for LHC on 8/21  2. HFrEF secondary to ICM: - Stable. - unable to add  beta blocker, ACEi/ARB/Entresto, and spironolactone given her relative hypotension -Continue digoxin, check a level -Recent echo as above, no need to repeat at this time -LifeVest   3. HLD: -Recent lipid panel from 02/03/2018 showed an LDL of 60 with mildly elevated alk phos and ALT at that time -Follow up LFT normal  -Continue Lipitor 80 mg daily -Decrease high sugar foods and drinks  4. COPD secondary to tobacco abuse: - Cont inhalers.  5. DM2: -Poorly controlled - cont lantus, advised diet control, ISS  6. Anxiety: - ativan PRN    All the records are reviewed and case discussed with Care Management/Social Workerr. Management plans discussed with the patient, family and they are in agreement.  CODE STATUS: full.  TOTAL TIME TAKING CARE OF THIS PATIENT: 35 minutes.     POSSIBLE D/C IN 1-2 DAYS, DEPENDING ON CLINICAL CONDITION.   Vaughan Basta M.D on 02/07/2018   Between 7am to 6pm - Pager - [phone]  After 6pm go to [email or web address] - password [email or web address] [facility]  Office  [phone]  CC: Primary care physician; Ro, [name], MD  Note: This dictation was prepared with Dragon dictation along with smaller phrase technology. Any transcriptional errors that result from this process are unintentional.

## 2018-02-07 NOTE — ED Provider Notes (Signed)
[facility] Emergency Department Provider Note   ____________________________________________   First MD Initiated Contact with Patient 02/06/18 2318     (approximate)  I have reviewed the triage vital signs and the nursing notes.   HISTORY  Chief Complaint Chest Pain; Nausea; and Vomiting    HPI Renee Mack is a 52 y.o. female who comes into the hospital today with some chest pain.  The patient reports that it feels like heartburn and started around 6 PM.  She was laying down when this started.  She reports that she was sick to her stomach and had some vomiting but she could not tell me which started first.  She took some Nexium and Tums but also took some aspirin and nitroglycerin.  She reports that nothing would stop the pain.  She has some mild shortness of breath and states that the pain is moving into her neck into her back.  The patient rates her pain a 9 out of 10 in intensity currently.  She states that she vomited about 4-5 times and it looks foamy.  She does have some epigastric pain but has not eaten much today.  The patient is here today for evaluation of her symptoms.   Past Medical History:  Diagnosis Date  . Cancer ([place])    vulvular  . Cervical disc disease    a. 01/2018 MRI Cervical spine: Cervical spondylosis with multilevel disc and facet degeneration greatest at C5-6 and C6-7.  Moderate to sev R C5-6, mod L C5-6, and mod bilat C6-7 foraminal stenosis with multilevel mild foraminal stenosis.  Mild C5-6 and mod C6-7 canal stenosis. C6-7 central cord impingement w/ cord flattening.  . Chronic combined systolic (congestive) and diastolic (congestive) heart failure ([place])    a. 12/2017 Echo: EF 30-35%, mid-apicalanteroseptal, ant, and apical AK. Gr1 DD. Mild conc LVH.  Marland Kitchen COPD (chronic obstructive pulmonary disease) (HCC)    not on home oxygen  . Coronary artery disease    a. 12/2017 ACS/PCI: LM nl, LAD 100p (2.25x26 Resolute Onyx DES), 48m  (2.0x12 Resolute Onyx DES), 80d (2.0x15 Resolute Onyx DES), LCX 30p, RCA 90p (non-dominant). EF 25-35%. Post-MI course complicated by cardiogenic shock req vasopressor Rx; b. 12/2017 NSTEMI/subacute thrombosis-->LAD 100 (PTCA + DES x 1).  . Diabetes mellitus without complication ([place])   . GERD (gastroesophageal reflux disease)   . HLD (hyperlipidemia)   . Ischemic cardiomyopathy    a. 12/2017 Echo: EF 30-35%.  . Myocardial infarction ([place])    a. 12/2017-->DES to LAD x 3.  . Pancreatitis   . Shingles   . Tobacco abuse     Patient Active Problem List   Diagnosis Date Noted  . Non-ST elevation (NSTEMI) myocardial infarction ([place])   . Chronic systolic heart failure ([place])   . Ischemic chest pain 12/26/2017  . Chest pain 12/26/2017  . Unstable angina ([place])   . Acute ST elevation myocardial infarction (STEMI) of anterior wall ([place])   . Ischemic cardiomyopathy   . Acute systolic heart failure ([place])   . Cardiogenic shock ([place])   . Tobacco abuse   . Acute ST elevation myocardial infarction (STEMI) involving left anterior descending (LAD) coronary artery ([place]) 12/19/2017  . Protein-calorie malnutrition, severe 07/18/2017  . DKA (diabetic ketoacidoses) ([place]) 07/17/2017  . Acute ischemic enteritis ([place]) 06/12/2017  . Pyelonephritis 12/28/2014  . Type 2 diabetes mellitus ([place]) 12/28/2014  . COPD (chronic obstructive pulmonary disease) ([place]) 12/28/2014  . GERD (gastroesophageal reflux disease) 12/28/2014  . History of  shingles 12/28/2014  . Hyperlipidemia LDL goal <70 12/28/2014    Past Surgical History:  Procedure Laterality Date  . ABDOMINAL HYSTERECTOMY     partial  . CORONARY STENT INTERVENTION N/A 12/26/2017   Procedure: CORONARY STENT INTERVENTION;  Surgeon: Wellington Hampshire, MD;  Location: [facility];  Service: Cardiovascular;  Laterality: N/A;  . CORONARY/GRAFT ACUTE MI REVASCULARIZATION N/A 12/19/2017   Procedure: Coronary/Graft Acute MI Revascularization;  Surgeon: Wellington Hampshire, MD;  Location: [facility];  Service: Cardiovascular;  Laterality: N/A;  . ECTOPIC PREGNANCY SURGERY Left   . LEFT HEART CATH AND CORONARY ANGIOGRAPHY N/A 12/19/2017   Procedure: LEFT HEART CATH AND CORONARY ANGIOGRAPHY;  Surgeon: Wellington Hampshire, MD;  Location: [facility];  Service: Cardiovascular;  Laterality: N/A;  . LEFT HEART CATH AND CORONARY ANGIOGRAPHY N/A 12/26/2017   Procedure: LEFT HEART CATH AND CORONARY ANGIOGRAPHY;  Surgeon: Wellington Hampshire, MD;  Location: [facility];  Service: Cardiovascular;  Laterality: N/A;  . LYMPHADENECTOMY    . SPLENECTOMY, PARTIAL    . vulvulasectomy      Prior to Admission medications   Medication Sig Start Date End Date Taking? Authorizing Provider  acetaminophen (TYLENOL) 325 MG tablet Take 650 mg by mouth every 6 (six) hours as needed for mild pain.    Yes [provider]  albuterol (PROAIR HFA) 108 (90 Base) MCG/ACT inhaler Inhale 1 puff into the lungs every 6 (six) hours as needed for wheezing or shortness of breath.    Yes [provider]  aspirin 81 MG tablet Take 81 mg by mouth daily.   Yes [provider]  atorvastatin (LIPITOR) 80 MG tablet Take 1 tablet (80 mg total) by mouth daily at 6 PM. 01/10/18 03/11/18 Yes Theora Gianotti, NP  citalopram (CELEXA) 20 MG tablet Take 20 mg daily by mouth.   Yes [provider]  cyclobenzaprine (FLEXERIL) 10 MG tablet Take 1 tablet (10 mg total) by mouth 3 (three) times daily as needed for muscle spasms. 01/17/18  Yes Dustin Flock, MD  digoxin (LANOXIN) 0.125 MG tablet Take 1 tablet (0.125 mg total) by mouth daily. 02/02/18  Yes Theora Gianotti, NP  esomeprazole (NEXIUM) 40 MG capsule Take 40 mg by mouth daily at 12 noon.   Yes [provider]  insulin aspart (NOVOLOG) 100 UNIT/ML injection Inject 10 Units into the skin 3 (three) times daily before meals. Take 10 units PLUS sliding scale   Yes [provider]  insulin glargine (LANTUS) 100 UNIT/ML injection Inject 60 Units into the skin at bedtime.   Yes [provider]  LORazepam (ATIVAN) 1 MG tablet Take 1 tablet (1 mg total) by mouth every 8 (eight) hours. 02/02/18  Yes Theora Gianotti, NP  ondansetron (ZOFRAN) 4 MG tablet Take 1 tablet (4 mg total) by mouth daily as needed. 01/05/18  Yes Schaevitz, Randall An, MD  ranolazine (RANEXA) 500 MG 12 hr tablet Take 1 tablet (500 mg total) by mouth 2 (two) times daily. 01/28/18  Yes Dustin Flock, MD  ticagrelor (BRILINTA) 90 MG TABS tablet Take 1 tablet (90 mg total) by mouth 2 (two) times daily. 01/10/18 03/11/18 Yes Theora Gianotti, NP  oxyCODONE (OXY IR/ROXICODONE) 5 MG immediate release tablet Take 1 tablet (5 mg total) by mouth every 8 (eight) hours as needed for moderate pain or severe pain. Patient not taking: Reported on 02/07/2018 01/28/18   Dustin Flock, MD    Allergies  Bee venom; Metformin and related; Darvon [propoxyphene]; Gabapentin; Nsaids; and Tramadol  Family History  Problem Relation Age of Onset  . Cancer Mother   . Osteoporosis Sister   . Heart disease Brother   . Heart attack Father     Social History Social History   Tobacco Use  . Smoking status: Former Smoker    Packs/day: 0.50    Years: 30.00    Pack years: 15.00    Types: Cigarettes  . Smokeless tobacco: Never Used  . Tobacco comment: quit 12/19/2017.  Substance Use Topics  . Alcohol use: No  . Drug use: No    Review of Systems  Constitutional: No fever/chills Eyes: No visual changes. ENT: No sore throat. Cardiovascular:  chest pain. Respiratory:  shortness of breath. Gastrointestinal: Nausea, vomiting with epigastric abdominal pain.  No diarrhea.  No constipation. Genitourinary: Negative for dysuria. Musculoskeletal: Negative for back pain. Skin: Negative for rash. Neurological: Negative for headaches, focal weakness or  numbness.   ____________________________________________   PHYSICAL EXAM:  VITAL SIGNS: ED Triage Vitals  Enc Vitals Group     BP 02/06/18 2230 111/63     Pulse Rate 02/06/18 2230 75     Resp 02/06/18 2230 15     Temp 02/06/18 2233 98 F (36.7 C)     Temp Source 02/06/18 2233 Oral     SpO2 02/06/18 2230 98 %     Weight 02/06/18 2234 135 lb (61.2 kg)     Height 02/06/18 2234 5\' 1"  (1.549 m)     Head Circumference --      Peak Flow --      Pain Score 02/06/18 2233 9     Pain Loc --      Pain Edu? --      Excl. in [place]? --     Constitutional: Alert and oriented. Well appearing and in moderate distress. Eyes: Conjunctivae are normal. PERRL. EOMI. Head: Atraumatic. Nose: No congestion/rhinnorhea. Mouth/Throat: Mucous membranes are moist.  Oropharynx non-erythematous. Cardiovascular: Normal rate, regular rhythm. Grossly normal heart sounds.  Good peripheral circulation. Respiratory: Normal respiratory effort.  No retractions. Lungs CTAB. Gastrointestinal: Soft epigastric tenderness to palpation. No distention.  Positive bowel sounds Musculoskeletal: No lower extremity tenderness nor edema.   Neurologic:  Normal speech and language.  Skin:  Skin is warm, dry and intact.  Psychiatric: Mood and affect are normal.   ____________________________________________   LABS (all labs ordered are listed, but only abnormal results are displayed)  Labs Reviewed  BASIC METABOLIC PANEL - Abnormal; Notable for the following components:      Result Value   Glucose, Bld 474 (*)    All other components within normal limits  CBC - Abnormal; Notable for the following components:   RDW 15.0 (*)    All other components within normal limits  TROPONIN I  LIPASE, BLOOD  HEPATIC FUNCTION PANEL  DIGOXIN LEVEL  TROPONIN I  TROPONIN I  POC URINE PREG, ED   ____________________________________________  EKG  ED ECG REPORT I, Loney Hering, the attending physician, personally viewed  and interpreted this ECG.   Date: 02/06/2018  EKG Time: 2227  Rate: 74  Rhythm: normal sinus rhythm  Axis: normal  Intervals:none  ST&T Change: Flipped T waves in lead I, aVL, V2 and V3. Similar to 01/26/18  ____________________________________________  RADIOLOGY  ED MD interpretation:  CXR: No cute pulmonary process identified, aortic atherosclerosis  Official radiology report(s): Dg Chest 2 View  Result Date: 02/06/2018 CLINICAL DATA:  52 y/o F; chest pain, shortness of breath, nausea, and vomiting. EXAM: CHEST - 2 VIEW COMPARISON:  01/26/2018 CT chest and chest radiograph FINDINGS: Stable normal cardiac silhouette. Aortic atherosclerosis with calcification. Electronic device multiple leads partially obscures the lungs. No consolidation identified. No pleural effusion or pneumothorax. No acute osseous abnormality is evident. IMPRESSION: No acute pulmonary process identified.  Aortic atherosclerosis. Electronically Signed   By: Kristine Garbe M.D.   On: 02/06/2018 22:58    ____________________________________________   PROCEDURES  Procedure(s) performed: None  Procedures  Critical Care performed: No  ____________________________________________   INITIAL IMPRESSION / ASSESSMENT AND PLAN / ED COURSE  As part of my medical decision making, I reviewed the following data within the electronic MEDICAL RECORD NUMBER Notes from prior ED visits and [place] Controlled Substance Database   This is a 52 year old female with a history of an STEMI status post stent and wearing a LifeVest who comes into the hospital today with some chest pain with radiation to her neck and back as well as some nausea and vomiting.  We did check some blood work on the patient to include a CBC, BMP, troponin, lipase and a hepatic function panel.  The patient's blood work is all unremarkable.  The patient was in some significant discomfort when she arrived so I gave her some Zofran as well as some Pepcid  and morphine.  Her pain is improved.  She also receive a liter as her glucose is elevated.  Given the patient's history I am concerned for acute coronary syndrome.  She did have a catheterization in July but had some significant disease.  Although her pain is improved I will admit the patient to the hospitalist service for further evaluation of her chest pain.      ____________________________________________   FINAL CLINICAL IMPRESSION(S) / ED DIAGNOSES  Final diagnoses:  Chest pain, unspecified type  Nausea and vomiting, intractability of vomiting not specified, unspecified vomiting type     ED Discharge Orders    None       Note:  This document was prepared using Dragon voice recognition software and may include unintentional dictation errors.    Loney Hering, MD 02/07/18 [phone]

## 2018-02-07 NOTE — H&P (View-Only) (Signed)
Cardiology Consultation:   Patient ID: Renee Mack; [id number]; 1965-10-09   Admit date: 02/06/2018 Date of Consult: 02/07/2018  Primary Care Provider: Janalyn Shy, MD Primary Cardiologist: Renee Mack   Patient Profile:   Renee Mack is a 52 y.o. female with a hx of CAD status post recent myocardial infarction and drug-eluting stent placement to the LAD x3 with subsequent readmission for subacute stent thrombosis and repeat PCI, combined CHF/ischemic cardiomyopathy with an EF of 30 to 35%, tobacco abuse, COPD, hyperlipidemia, GERD, type 2 diabetes mellitus, and pancreatitis who is being seen today for the evaluation of chest pain at the request of Dr. Jodell Cipro.  History of Present Illness:   Renee Mack was admitted to [facility] in early July with a several day history of chest pain radiating to her neck and arm.  She was found to have elevated troponins and subsequently underwent catheterization revealing severe proximal, mid, and distal LAD disease along with severe disease in a non-dominant right coronary artery.  EF was 25 to 35%.  She underwent drug-eluting stent placement to the LAD x3.  Post catheterization course was complicated by cardiogenic shock and hypotension requiring vasopressors.  Follow-up echo showed an EF of 30 to 35%.  She was discharged on July 3 with a LifeVest in place.  She return to the emergency department on July 8 with recurrent chest pain radiating to her neck, back, and down her arms.  She also had nausea, vomiting, and dyspnea.  Repeat catheterization revealed a total occlusion of the proximal to mid LAD with large thrombus burden.  The LAD was successfully treated with PTCA and also a drug-eluting stent to the mid LAD.  Collaterals were noted from the RCA to the LAD.  Given the anterior apical scar related to her late-presentation as well as long segments of stents, there was concern for a high chance that the LAD will close again in the future. It was  recommended if that happens there may not ben much benefit from re-attempting another PCI and it might be best to rely on collaterals from the RCA. In that situation, PCI of the RCA may be indicated in spite of the vessel being nondominant. She was subsequently discharged and seen in clinic July 23, at which time she reported a few episodes of left upper chest/neck/shoulder discomfort without associated symptoms occurring exclusively when lying in bed at night.  Continue medical therapy was recommended.  She was admitted to [facility] July 28 due to neck and shoulder pain.  Troponins were normal and medical therapy was recommended.  She was readmitted August 8 with similar symptoms and again, troponins were normal and medical therapy was recommended.  MRI of the cervical spine was performed during that admission and showed cervical spondylosis with multilevel disc and facet degeneration greatest at C5-6 and C6-7.  She also had moderate to severe right C5-6, moderate left C5-6, and moderate bilateral C6-7 foraminal stenosis with multilevel mild foraminal stenosis.  Mild C5-6 and moderate C6-7 canal stenosis was noted, along with C6-7 central cord impingement with cord flattening.  It was felt that perhaps neck and shoulder pain related to these findings.  She was subsequently discharged. She was seen in follow up on 02/02/18 and continued to note intermittent neck and shoulder pain, mostly occurring while laying in bed.   Patient developed nausea and vomiting with associated what she felt like was heartburn after cleaning up around her house on 8/19. This was followed by chest tightness  that radiated up to her neck and down her left shoulder. Pain felt somewhat similar to her prior episodes. She reports compliance with all medications except for digoxin, which she ran out of a couple days prior. She reports her symptoms did improve somewhat following her most recent admission, though returned on 8/19 as  above.   Upon the patient's arrival to [facility] they were found to have stable vitals. EKG showed NSR, 74 bpm, prior anteroseptal infarct, lateral TWI, CXR showed aortic atherosclerosis without acute process. Labs showed troponin negative x 2, WBC 10.1, HGB 13.2, PLT 250, Na 137, K+ 3.6, SCr 0.62, lipase 18, LFT normal. In the ED and upon admission, the patient was given Pepcid, morphine, and Zofran. Currently, continues to note some nausea without emesis as well as chest tightness. This is improving.   Past Medical History:  Diagnosis Date  . Cancer (HCC)    vulvular  . Cervical disc disease    a. 01/2018 MRI Cervical spine: Cervical spondylosis with multilevel disc and facet degeneration greatest at C5-6 and C6-7.  Moderate to sev R C5-6, mod L C5-6, and mod bilat C6-7 foraminal stenosis with multilevel mild foraminal stenosis.  Mild C5-6 and mod C6-7 canal stenosis. C6-7 central cord impingement w/ cord flattening.  . Chronic combined systolic (congestive) and diastolic (congestive) heart failure (HCC)    a. 12/2017 Echo: EF 30-35%, mid-apicalanteroseptal, ant, and apical AK. Gr1 DD. Mild conc LVH.  . COPD (chronic obstructive pulmonary disease) (HCC)    not on home oxygen  . Coronary artery disease    a. 12/2017 ACS/PCI: LM nl, LAD 100p (2.25x26 Resolute Onyx DES), 80m (2.0x12 Resolute Onyx DES), 80d (2.0x15 Resolute Onyx DES), LCX 30p, RCA 90p (non-dominant). EF 25-35%. Post-MI course complicated by cardiogenic shock req vasopressor Rx; b. 12/2017 NSTEMI/subacute thrombosis-->LAD 100 (PTCA + DES x 1).  . Diabetes mellitus without complication (HCC)   . GERD (gastroesophageal reflux disease)   . HLD (hyperlipidemia)   . Ischemic cardiomyopathy    a. 12/2017 Echo: EF 30-35%.  . Myocardial infarction (HCC)    a. 12/2017-->DES to LAD x 3.  . Pancreatitis   . Shingles   . Tobacco abuse     Past Surgical History:  Procedure Laterality Date  . ABDOMINAL HYSTERECTOMY     partial  . CORONARY STENT  INTERVENTION N/A 12/26/2017   Procedure: CORONARY STENT INTERVENTION;  Surgeon: Arida, Muhammad A, MD;  Location: [facility] INVASIVE CV LAB;  Service: Cardiovascular;  Laterality: N/A;  . CORONARY/GRAFT ACUTE MI REVASCULARIZATION N/A 12/19/2017   Procedure: Coronary/Graft Acute MI Revascularization;  Surgeon: Arida, Muhammad A, MD;  Location: [facility] INVASIVE CV LAB;  Service: Cardiovascular;  Laterality: N/A;  . ECTOPIC PREGNANCY SURGERY Left   . LEFT HEART CATH AND CORONARY ANGIOGRAPHY N/A 12/19/2017   Procedure: LEFT HEART CATH AND CORONARY ANGIOGRAPHY;  Surgeon: Arida, Muhammad A, MD;  Location: [facility] INVASIVE CV LAB;  Service: Cardiovascular;  Laterality: N/A;  . LEFT HEART CATH AND CORONARY ANGIOGRAPHY N/A 12/26/2017   Procedure: LEFT HEART CATH AND CORONARY ANGIOGRAPHY;  Surgeon: Arida, Muhammad A, MD;  Location: [facility] INVASIVE CV LAB;  Service: Cardiovascular;  Laterality: N/A;  . LYMPHADENECTOMY    . SPLENECTOMY, PARTIAL    . vulvulasectomy       Home Meds: Prior to Admission medications   Medication Sig Start Date End Date Taking? Authorizing Provider  acetaminophen (TYLENOL) 325 MG tablet Take 650 mg by mouth every 6 (six) hours as needed for mild pain.      Yes [provider]  albuterol (PROAIR HFA) 108 (90 Base) MCG/ACT inhaler Inhale 1 puff into the lungs every 6 (six) hours as needed for wheezing or shortness of breath.    Yes [provider]  aspirin 81 MG tablet Take 81 mg by mouth daily.   Yes [provider]  atorvastatin (LIPITOR) 80 MG tablet Take 1 tablet (80 mg total) by mouth daily at 6 PM. 01/10/18 03/11/18 Yes Berge, Christopher Ronald, NP  citalopram (CELEXA) 20 MG tablet Take 20 mg daily by mouth.   Yes [provider]  cyclobenzaprine (FLEXERIL) 10 MG tablet Take 1 tablet (10 mg total) by mouth 3 (three) times daily as needed for muscle spasms. 01/17/18  Yes Patel, Shreyang, MD  digoxin (LANOXIN) 0.125 MG tablet Take 1 tablet (0.125 mg total) by mouth  daily. 02/02/18  Yes Berge, Christopher Ronald, NP  esomeprazole (NEXIUM) 40 MG capsule Take 40 mg by mouth daily at 12 noon.   Yes [provider]  insulin aspart (NOVOLOG) 100 UNIT/ML injection Inject 10 Units into the skin 3 (three) times daily before meals. Take 10 units PLUS sliding scale   Yes [provider]  insulin glargine (LANTUS) 100 UNIT/ML injection Inject 60 Units into the skin at bedtime.   Yes [provider]  LORazepam (ATIVAN) 1 MG tablet Take 1 tablet (1 mg total) by mouth every 8 (eight) hours. 02/02/18  Yes Berge, Christopher Ronald, NP  ondansetron (ZOFRAN) 4 MG tablet Take 1 tablet (4 mg total) by mouth daily as needed. 01/05/18  Yes Schaevitz, David Matthew, MD  ranolazine (RANEXA) 500 MG 12 hr tablet Take 1 tablet (500 mg total) by mouth 2 (two) times daily. 01/28/18  Yes Patel, Shreyang, MD  ticagrelor (BRILINTA) 90 MG TABS tablet Take 1 tablet (90 mg total) by mouth 2 (two) times daily. 01/10/18 03/11/18 Yes Berge, Christopher Ronald, NP  oxyCODONE (OXY IR/ROXICODONE) 5 MG immediate release tablet Take 1 tablet (5 mg total) by mouth every 8 (eight) hours as needed for moderate pain or severe pain. Patient not taking: Reported on 02/07/2018 01/28/18   Patel, Shreyang, MD    Inpatient Medications: Scheduled Meds: . aspirin  81 mg Oral Daily  . atorvastatin  80 mg Oral q1800  . citalopram  20 mg Oral Daily  . digoxin  0.125 mg Oral Daily  . insulin aspart  0-15 Units Subcutaneous TID WC  . insulin aspart  0-5 Units Subcutaneous QHS  . insulin glargine  60 Units Subcutaneous QHS  . pantoprazole  40 mg Oral Daily  . ranolazine  500 mg Oral BID  . ticagrelor  90 mg Oral BID   Continuous Infusions:  PRN Meds: acetaminophen, albuterol, bisacodyl, cyclobenzaprine, LORazepam, morphine injection, nitroGLYCERIN, ondansetron (ZOFRAN) IV, senna-docusate  Allergies:   Allergies  Allergen Reactions  . Bee Venom Itching, Shortness Of Breath and Swelling    . Metformin And Related Shortness Of Breath  . Darvon [Propoxyphene] Itching  . Gabapentin Swelling  . Nsaids Other (See Comments)    Ulcers   . Tramadol Hives    Social History:   Social History   Socioeconomic History  . Marital status: Widowed    Spouse name: Not on file  . Number of children: Not on file  . Years of education: Not on file  . Highest education level: Not on file  Occupational History  . Not on file  Social Needs  . Financial resource strain: Not very hard  . Food insecurity:      Worry: Never true    Inability: Never true  . Transportation needs:    Medical: No    Non-medical: No  Tobacco Use  . Smoking status: Former Smoker    Packs/day: 0.50    Years: 30.00    Pack years: 15.00    Types: Cigarettes  . Smokeless tobacco: Never Used  . Tobacco comment: quit 12/19/2017.  Substance and Sexual Activity  . Alcohol use: No  . Drug use: No  . Sexual activity: Not on file  Lifestyle  . Physical activity:    Days per week: 7 days    Minutes per session: 30 min  . Stress: Very much  Relationships  . Social connections:    Talks on phone: More than three times a week    Gets together: Patient refused    Attends religious service: Never    Active member of club or organization: No    Attends meetings of clubs or organizations: Never    Relationship status: Widowed  . Intimate partner violence:    Fear of current or ex partner: No    Emotionally abused: No    Physically abused: No    Forced sexual activity: No  Other Topics Concern  . Not on file  Social History Narrative   Lives in [place] by herself.  Does not routinely exercise.     Family History:   Family History  Problem Relation Age of Onset  . Cancer Mother   . Osteoporosis Sister   . Heart disease Brother   . Heart attack Father     ROS:  Review of Systems  Constitutional: Positive for malaise/fatigue. Negative for chills, diaphoresis, fever and weight loss.  HENT: Negative  for congestion.   Eyes: Negative for discharge and redness.  Respiratory: Positive for shortness of breath. Negative for cough, hemoptysis, sputum production and wheezing.   Cardiovascular: Positive for chest pain. Negative for palpitations, orthopnea, claudication, leg swelling and PND.  Gastrointestinal: Positive for abdominal pain, heartburn, nausea and vomiting. Negative for blood in stool and melena.  Genitourinary: Negative for hematuria.  Musculoskeletal: Positive for back pain, joint pain, myalgias and neck pain. Negative for falls.  Skin: Negative for rash.  Neurological: Positive for weakness. Negative for dizziness, tingling, tremors, sensory change, speech change, focal weakness and loss of consciousness.  Endo/Heme/Allergies: Does not bruise/bleed easily.  Psychiatric/Behavioral: Negative for substance abuse. The patient is nervous/anxious.   All other systems reviewed and are negative.     Physical Exam/Data:   Vitals:   02/07/18 0800 02/07/18 0945 02/07/18 1000 02/07/18 1032  BP: 131/71 115/70 123/71 (!) 106/58  Pulse: 74 78 80 70  Resp: _0 Temp:    97.9 F (36.6 C)  TempSrc:    Oral  SpO2: 96% 94% 94% 98%  Weight:      Height:        Intake/Output Summary (Last 24 hours) at 02/07/2018 1047 Last data filed at 02/07/2018 0116 Gross per 24 hour  Intake 550 ml  Output -  Net 550 ml   Filed Weights   02/06/18 2234  Weight: 61.2 kg   Body mass index is 25.51 kg/m.   Physical Exam: General: Well developed, well nourished, in no acute distress. Head: Normocephalic, atraumatic, sclera non-icteric, no xanthomas, nares without discharge.  Neck: Negative for carotid bruits. JVD not elevated. Lungs: Clear bilaterally to auscultation without wheezes, rales, or rhonchi. Breathing is unlabored. Heart: RRR with S1 S2. No murmurs, rubs, or gallops  appreciated. Abdomen: Soft, non-tender, non-distended with normoactive bowel sounds. No hepatomegaly. No  rebound/guarding. No obvious abdominal masses. Msk:  Strength and tone appear normal for age. Extremities: No clubbing or cyanosis. No edema. Distal pedal pulses are 2+ and equal bilaterally. Neuro: Alert and oriented X 3. No facial asymmetry. No focal deficit. Moves all extremities spontaneously. Psych:  Responds to questions appropriately with a normal affect.   EKG:  The EKG was personally reviewed and demonstrates: NSR, 74 bpm, prior anteroseptal infarct, lateral TWI Telemetry:  Telemetry was personally reviewed and demonstrates: NSR  Weights: Filed Weights   02/06/18 2234  Weight: 61.2 kg    Relevant CV Studies:  LHC 12/19/2017: Conclusion     Prox Cx lesion is 30% stenosed.  Prox LAD lesion is 100% stenosed.  Mid LAD lesion is 80% stenosed.  Dist LAD lesion is 80% stenosed.  Post intervention, there is a 0% residual stenosis.  A drug-eluting stent was successfully placed using a STENT RESOLUTE ONYX 2.25X26.  Post intervention, there is a 0% residual stenosis.  Post intervention, there is a 10% residual stenosis.  A drug-eluting stent was successfully placed using a STENT RESOLUTE ONYX 2.0X15.  A drug-eluting stent was successfully placed using a STENT RESOLUTE ONYX 2.0X12.  The left ventricular ejection fraction is 25-35% by visual estimate.  Prox RCA lesion is 90% stenosed.   1.  Severe one-vessel coronary artery disease with thrombotic occlusion of the proximal LAD and significant disease in the mid and distal LAD.  Left dominant system.  Significant proximal stenosis in a nondominant right coronary artery. 2.  Moderately to severely reduced LV systolic function with an EF of 30% with akinesis of the mid to distal anterior and apical myocardium. 3.  Moderately elevated left ventricular end-diastolic pressure at 26 mmHg. 4.  Successful angioplasty and 3 drug-eluting stent placement to the LAD to establish TIMI-3 flow.  Recommendations Continue Aggrastat for  12 hours. Dual antiplatelet therapy for at least one year Aggressive treatment of risk factors. Obtain an echocardiogram. Wean off norepinephrine drip and start small dose carvedilol once blood pressure tolerates.  Please note that the initial EKGs did not meet STEMI criteria given that there was ST elevation only in V3.  However, given the patient's continued symptoms and change in her EKG from prior one, we decided to proceed with urgent cardiac catheterization.  __________  Echo 12/19/2017: Study Conclusions  - Left ventricle: The cavity size was normal. There was mild   concentric hypertrophy. Systolic function was moderately to   severely reduced. The estimated ejection fraction was in the   range of 30% to 35%. Akinesis of the mid-apicalanteroseptal,   anterior, and apical myocardium. Doppler parameters are   consistent with abnormal left ventricular relaxation (grade 1   diastolic dysfunction). __________   LHC 12/26/2017: Conclusion     Dist LAD lesion is 10% stenosed.  Balloon angioplasty was performed.  Prox Cx lesion is 30% stenosed.  Prox RCA lesion is 90% stenosed.  Prox LAD to Mid LAD lesion is 100% stenosed.  Mid LAD lesion is 70% stenosed.  A drug-eluting stent was successfully placed using a STENT RESOLUTE ONYX 2.0X18.  Post intervention, there is a 15% residual stenosis.  Post intervention, there is a 10% residual stenosis.  Balloon angioplasty was performed using a BALLOON Hagerman EUPHORA RX2.5X20.  Post intervention, there is a 0% residual stenosis.  Ost 2nd Diag to 2nd Diag lesion is 90% stenosed.   1.  Subacute stent thrombosis in   the LAD likely due to poor outflow related to previous late presentation of anterior myocardial infarction as well as long segments of stents.  Collaterals are noted from the right coronary artery to the LAD. 2.  Moderately elevated left ventricular end-diastolic pressure. 3.  Successful very difficult angioplasty and  drug-eluting stent placement to the LAD as outlined above.  Recommendations: Given anterior apical scar related to late presentation as well as long segments of stents, there is a high chance that the LAD will close again in the future.  If that happens, there might not be much benefit of attempting another PCI.  Might be best to rely on collaterals from the right coronary artery.  In that situation, PCI of the right coronary artery might be indicated in spite of the vessel being nondominant.  This will be considered before hospital discharge.  Recommend uninterrupted dual antiplatelet therapy with Aspirin 81mg daily and Ticagrelor 90mg twice daily for a minimum of 12 months (ACS - Class I recommendation).  Prefer long-term treatment beyond 1 year due to stent thrombosis.  Please note that the patient's EKG did not meet criteria for a STEMI and this was done as an urgent case due to continued chest pain.  _________  Laboratory Data:  Chemistry Recent Labs  Lab 02/06/18 2235  NA 137  K 3.6  CL 104  CO2 23  GLUCOSE 474*  BUN 11  CREATININE 0.62  CALCIUM 9.1  GFRNONAA >60  GFRAA >60  ANIONGAP 10    Recent Labs  Lab 02/03/18 0847 02/06/18 2235  PROT 7.9 6.8  ALBUMIN 4.0 3.5  AST 27 28  ALT 51* 30  ALKPHOS 144* 101  BILITOT 0.5 0.5   Hematology Recent Labs  Lab 02/06/18 2235  WBC 10.1  RBC 4.05  HGB 13.2  HCT 38.2  MCV 94.2  MCH 32.6  MCHC 34.6  RDW 15.0*  PLT 250   Cardiac Enzymes Recent Labs  Lab 02/06/18 2235 02/07/18 0647  TROPONINI <0.03 <0.03   No results for input(s): TROPIPOC in the last 168 hours.  BNPNo results for input(s): BNP, PROBNP in the last 168 hours.  DDimer No results for input(s): DDIMER in the last 168 hours.  Radiology/Studies:  Dg Chest 2 View  Result Date: 02/06/2018 IMPRESSION: No acute pulmonary process identified.  Aortic atherosclerosis. Electronically Signed   By: Lance  Furusawa-Stratton M.D.   On: 02/06/2018 22:58     Assessment and Plan:   1. CAD involving the native coronary arteries with unstable angina: -Currently, notes improving chest tightness with mild nausea -Cycle 3rd troponin to completely rule out -Continue DAPT with ASA 81 mg daily and Brilinta 90 mg bid -Pressures have been somewhat soft limiting addition of beta blocker or isosorbide for anti-anginal effect -Maintain Ranexa at 500 mg bid -Schedule for LHC on 8/21 -May need PCI of the non-dominant RCA as detailed in last cath report  -NPO at midnight -Risks and benefits of cardiac catheterization have been discussed with the patient including risks of bleeding, bruising, infection, kidney damage, stroke, heart attack, and death. The patient understands these risks and is willing to proceed with the procedure. All questions have been answered and concerns listened to  2. HFrEF secondary to ICM: -She does not appear grossly volume up at this time -We have been unable to add evidence-based heart failure therapy including beta blocker, ACEi/ARB/Entresto, and spironolactone given her relative hypotension -Continue digoxin, check a level -Recent echo as above, no need to repeat at this   time -LifeVest with plans for repeat echo in 03/2018  3. HLD: -Recent lipid panel from 02/03/2018 showed an LDL of 60 with mildly elevated alk phos and ALT at that time -Follow up LFT normal this admission -Continue Lipitor 80 mg daily -Decrease high sugar foods and drinks  4. COPD secondary to tobacco abuse: -Per IM  5. DM2: -Poorly controlled -Per IM  6. Anxiety: -Per IM   For questions or updates, please contact [facility] Please consult [email or web address] for contact info under Cardiology/STEMI.   Signed, Madilyn Cephas, PA-C [facility] Pager: [phone] 02/07/2018, 10:47 AM    

## 2018-02-07 NOTE — H&P (Signed)
[facility] [place] NAME: Renee Mack    MR#:  [id number]  DATE OF BIRTH:  Apr 18, 1966  DATE OF ADMISSION:  02/06/2018  PRIMARY CARE PHYSICIAN: Ro, [name], MD   REQUESTING/REFERRING PHYSICIAN: Loney Hering, MD  CHIEF COMPLAINT:   Chief Complaint  Patient presents with  . Chest Pain  . Nausea  . Vomiting    HISTORY OF PRESENT ILLNESS:  Renee Mack  is a 52 y.o. female with a known history of multi-vessel CAD (recent cath 12/26/2017 by Dr. Fletcher Anon), chronic systolic + diastolic CHF (EF 12-45% w/ grade 1 diastolic dysfxn as of 80/99/8338 Echo; LifeVest), T2IDDM p/w 1d Hx heartburn/indigestion, N/V, CP. Pt states that she had poor appetite and minimal PO intake throughout the day yesterday (Mon 02/06/2018). She states that at some point in the late afternoon/early evening (she is unsure what time), she developed heartburn/indigestion, for which she took Nexium and Tums. Symptoms did not improve. She then had 4-5 episodes of N/V, w/ vomitus characterized as clear liquid, (-) blood. She developed midchest and B/L upper chest tightness/discomfort radiating into the R neck, which she states was persistent and not related to movement, position change, exertion or cough/inspiration. She says she lied down to rest for some time, but had persistent pain, as well as continued nausea and dry heaving. She endorses palpitations and diaphoresis, but denies F/C, cough/SOB, AP, diarrhea. Had recent hospitalization (08/08-08/10) for evaluation of CP. Pt is comfortable, well-appearing and is not in distress at the time of my assessment.  PAST MEDICAL HISTORY:   Past Medical History:  Diagnosis Date  . Cancer ([place])    vulvular  . Cervical disc disease    a. 01/2018 MRI Cervical spine: Cervical spondylosis with multilevel disc and facet degeneration greatest at C5-6 and C6-7.  Moderate to sev R C5-6, mod L C5-6, and mod bilat C6-7 foraminal stenosis with  multilevel mild foraminal stenosis.  Mild C5-6 and mod C6-7 canal stenosis. C6-7 central cord impingement w/ cord flattening.  . Chronic combined systolic (congestive) and diastolic (congestive) heart failure ([place])    a. 12/2017 Echo: EF 30-35%, mid-apicalanteroseptal, ant, and apical AK. Gr1 DD. Mild conc LVH.  Marland Kitchen COPD (chronic obstructive pulmonary disease) ([facility])    not on home oxygen  . Coronary artery disease    a. 12/2017 ACS/PCI: LM nl, LAD 100p (2.25x26 Resolute Onyx DES), 46m (2.0x12 Resolute Onyx DES), 80d (2.0x15 Resolute Onyx DES), LCX 30p, RCA 90p (non-dominant). EF 25-35%. Post-MI course complicated by cardiogenic shock req vasopressor Rx; b. 12/2017 NSTEMI/subacute thrombosis-->LAD 100 (PTCA + DES x 1).  . Diabetes mellitus without complication ([place])   . GERD (gastroesophageal reflux disease)   . HLD (hyperlipidemia)   . Ischemic cardiomyopathy    a. 12/2017 Echo: EF 30-35%.  . Myocardial infarction ([place])    a. 12/2017-->DES to LAD x 3.  . Pancreatitis   . Shingles   . Tobacco abuse     PAST SURGICAL HISTORY:   Past Surgical History:  Procedure Laterality Date  . ABDOMINAL HYSTERECTOMY     partial  . CORONARY STENT INTERVENTION N/A 12/26/2017   Procedure: CORONARY STENT INTERVENTION;  Surgeon: Wellington Hampshire, MD;  Location: [facility];  Service: Cardiovascular;  Laterality: N/A;  . CORONARY/GRAFT ACUTE MI REVASCULARIZATION N/A 12/19/2017   Procedure: Coronary/Graft Acute MI Revascularization;  Surgeon: Wellington Hampshire, MD;  Location: [facility];  Service: Cardiovascular;  Laterality: N/A;  .  ECTOPIC PREGNANCY SURGERY Left   . LEFT HEART CATH AND CORONARY ANGIOGRAPHY N/A 12/19/2017   Procedure: LEFT HEART CATH AND CORONARY ANGIOGRAPHY;  Surgeon: Wellington Hampshire, MD;  Location: [facility];  Service: Cardiovascular;  Laterality: N/A;  . LEFT HEART CATH AND CORONARY ANGIOGRAPHY N/A 12/26/2017   Procedure: LEFT HEART CATH AND CORONARY ANGIOGRAPHY;   Surgeon: Wellington Hampshire, MD;  Location: [facility];  Service: Cardiovascular;  Laterality: N/A;  . LYMPHADENECTOMY    . SPLENECTOMY, PARTIAL    . vulvulasectomy      SOCIAL HISTORY:   Social History   Tobacco Use  . Smoking status: Former Smoker    Packs/day: 0.50    Years: 30.00    Pack years: 15.00    Types: Cigarettes  . Smokeless tobacco: Never Used  . Tobacco comment: quit 12/19/2017.  Substance Use Topics  . Alcohol use: No    FAMILY HISTORY:   Family History  Problem Relation Age of Onset  . Cancer Mother   . Osteoporosis Sister   . Heart disease Brother   . Heart attack Father     DRUG ALLERGIES:   Allergies  Allergen Reactions  . Bee Venom Itching, Shortness Of Breath and Swelling  . Metformin And Related Shortness Of Breath  . Darvon [Propoxyphene] Itching  . Gabapentin Swelling  . Nsaids Other (See Comments)    Ulcers   . Tramadol Hives    REVIEW OF SYSTEMS:   Review of Systems  Constitutional: Positive for diaphoresis. Negative for chills, fever, malaise/fatigue and weight loss.  HENT: Negative for congestion, ear pain, hearing loss, nosebleeds, sinus pain, sore throat and tinnitus.   Eyes: Negative for blurred vision, double vision and photophobia.  Respiratory: Negative for cough, hemoptysis, sputum production, shortness of breath and wheezing.   Cardiovascular: Positive for chest pain and palpitations. Negative for orthopnea, claudication, leg swelling and PND.  Gastrointestinal: Positive for heartburn, nausea and vomiting. Negative for abdominal pain, blood in stool, constipation, diarrhea and melena.  Genitourinary: Negative for dysuria, frequency, hematuria and urgency.  Musculoskeletal: Positive for neck pain. Negative for back pain, joint pain and myalgias.  Skin: Negative for itching and rash.  Neurological: Negative for dizziness, tingling, tremors, sensory change, speech change, focal weakness, seizures, loss of  consciousness, weakness and headaches.  Psychiatric/Behavioral: Negative for memory loss. The patient does not have insomnia.    MEDICATIONS AT HOME:   Prior to Admission medications   Medication Sig Start Date End Date Taking? Authorizing Provider  acetaminophen (TYLENOL) 325 MG tablet Take 650 mg by mouth every 6 (six) hours as needed for mild pain.    Yes [provider]  albuterol (PROAIR HFA) 108 (90 Base) MCG/ACT inhaler Inhale 1 puff into the lungs every 6 (six) hours as needed for wheezing or shortness of breath.    Yes [provider]  aspirin 81 MG tablet Take 81 mg by mouth daily.   Yes [provider]  atorvastatin (LIPITOR) 80 MG tablet Take 1 tablet (80 mg total) by mouth daily at 6 PM. 01/10/18 03/11/18 Yes Theora Gianotti, NP  citalopram (CELEXA) 20 MG tablet Take 20 mg daily by mouth.   Yes [provider]  cyclobenzaprine (FLEXERIL) 10 MG tablet Take 1 tablet (10 mg total) by mouth 3 (three) times daily as needed for muscle spasms. 01/17/18  Yes Dustin Flock, MD  digoxin (LANOXIN) 0.125 MG tablet Take 1 tablet (0.125 mg total) by mouth daily. 02/02/18  Yes Theora Gianotti, NP  esomeprazole (NEXIUM) 40 MG capsule Take 40 mg by mouth daily at 12 noon.   Yes [provider]  insulin aspart (NOVOLOG) 100 UNIT/ML injection Inject 10 Units into the skin 3 (three) times daily before meals. Take 10 units PLUS sliding scale   Yes [provider]  insulin glargine (LANTUS) 100 UNIT/ML injection Inject 60 Units into the skin at bedtime.   Yes [provider]  LORazepam (ATIVAN) 1 MG tablet Take 1 tablet (1 mg total) by mouth every 8 (eight) hours. 02/02/18  Yes Theora Gianotti, NP  ondansetron (ZOFRAN) 4 MG tablet Take 1 tablet (4 mg total) by mouth daily as needed. 01/05/18  Yes Schaevitz, Randall An, MD  ranolazine (RANEXA) 500 MG 12 hr tablet Take 1 tablet (500 mg total) by mouth 2 (two) times  daily. 01/28/18  Yes Dustin Flock, MD  ticagrelor (BRILINTA) 90 MG TABS tablet Take 1 tablet (90 mg total) by mouth 2 (two) times daily. 01/10/18 03/11/18 Yes Theora Gianotti, NP  oxyCODONE (OXY IR/ROXICODONE) 5 MG immediate release tablet Take 1 tablet (5 mg total) by mouth every 8 (eight) hours as needed for moderate pain or severe pain. Patient not taking: Reported on 02/07/2018 01/28/18   Dustin Flock, MD      VITAL SIGNS:  Blood pressure 111/64, pulse 67, temperature 98 F (36.7 C), temperature source Oral, resp. rate (!) 22, height 5\' 1"  (1.549 m), weight 61.2 kg, SpO2 96 %.  PHYSICAL EXAMINATION:  Physical Exam  Constitutional: She is oriented to person, place, and time. She appears well-developed and well-nourished. She is active and cooperative.  Non-toxic appearance. She does not have a sickly appearance. She does not appear ill. No distress. She is not intubated.  HENT:  Head: Normocephalic and atraumatic.  Mouth/Throat: Oropharynx is clear and moist. No oropharyngeal exudate.  Eyes: Conjunctivae, EOM and lids are normal. No scleral icterus.  Neck: Neck supple. No JVD present. No thyromegaly present.  Cardiovascular: Normal rate, regular rhythm, S1 normal, S2 normal and normal heart sounds.  No extrasystoles are present. Exam reveals no gallop, no S3, no S4, no distant heart sounds and no friction rub.  No murmur heard. Pulmonary/Chest: Effort normal. No accessory muscle usage or stridor. No apnea, no tachypnea and no bradypnea. She is not intubated. No respiratory distress. She has decreased breath sounds in the right upper field, the right middle field, the right lower field, the left upper field, the left middle field and the left lower field. She has no wheezes. She has no rhonchi. She has no rales.  Abdominal: Soft. She exhibits no distension. Bowel sounds are decreased. There is no tenderness. There is no rebound and no guarding.  Musculoskeletal: Normal range of  motion. She exhibits no edema or tenderness.       Right lower leg: Normal. She exhibits no tenderness and no edema.       Left lower leg: Normal. She exhibits no tenderness and no edema.  Lymphadenopathy:    She has no cervical adenopathy.  Neurological: She is alert and oriented to person, place, and time. She is not disoriented.  Skin: Skin is warm, dry and intact. No rash noted. She is not diaphoretic. No erythema. No pallor.  Psychiatric: She has a normal mood and affect. Her speech is normal and behavior is normal. Judgment and thought content normal. Her mood appears not anxious. She is not agitated. Cognition and memory are normal.   LifeVest.  LABORATORY PANEL:   CBC Recent Labs  Lab 02/06/18 2235  WBC 10.1  HGB 13.2  HCT 38.2  PLT 250   ------------------------------------------------------------------------------------------------------------------  Chemistries  Recent Labs  Lab 02/06/18 2235  NA 137  K 3.6  CL 104  CO2 23  GLUCOSE 474*  BUN 11  CREATININE 0.62  CALCIUM 9.1  AST 28  ALT 30  ALKPHOS 101  BILITOT 0.5   ------------------------------------------------------------------------------------------------------------------  Cardiac Enzymes Recent Labs  Lab 02/06/18 2235  TROPONINI <0.03   ------------------------------------------------------------------------------------------------------------------  RADIOLOGY:  Dg Chest 2 View  Result Date: 02/06/2018 CLINICAL DATA:  52 y/o F; chest pain, shortness of breath, nausea, and vomiting. EXAM: CHEST - 2 VIEW COMPARISON:  01/26/2018 CT chest and chest radiograph FINDINGS: Stable normal cardiac silhouette. Aortic atherosclerosis with calcification. Electronic device multiple leads partially obscures the lungs. No consolidation identified. No pleural effusion or pneumothorax. No acute osseous abnormality is evident. IMPRESSION: No acute pulmonary process identified.  Aortic atherosclerosis.  Electronically Signed   By: Kristine Garbe M.D.   On: 02/06/2018 22:58   IMPRESSION AND PLAN:   A/P: 61F w/ known multivessel CAD p/w 1d Hx heartburn/indigestion, N/V, CP. Hyperglycemia (w/ T2IDDM). -Heartburn/indigestion, N/V, CP: Cardiac vs. GI etiology of symptoms. Concerning given pt's complex cardiac Hx. Based on Dr. Tyrell Antonio 12/26/2017 cath report, there appears to be expectation that, "there is a high chance that the LAD will close again in the future.  If that happens, there might not be much benefit of attempting another PCI.  Might be best to rely on collaterals from the right coronary artery.  In that situation, PCI of the right coronary artery might be indicated in spite of the vessel being nondominant." Trop-I (-) x1, rpt pending. EKG (-) acute ST elevation. CXR (-) acute pulmonary process. ASA81, morphine, NTG, O2. C/w statin, Brilinta, Ranexa. Not on loop diuretics, ACE-I/ARB, beta blocker, Aldactone, Entresto. Denies Hx pulmonary edema. Cardiology consult. Given Hx CHF, will not administer IVF. Digoxin level. Symptomatic mgmt. -Hyperglycemia/T2IDDM: SSI. C/w Lantus. -c/w other home meds/formulary subs. -FEN/GI: NPO, ice chips/sips of water.  -DVT PPx: Lovenox. -Code status: Full code. -Disposition: Observation, < 2 midnights, but may warrant conversion to inpatient admission if it is determined pt will benefit from procedure (i.e. cardiac cath).   All the records are reviewed and case discussed with ED provider. Management plans discussed with the patient, family and they are in agreement.  CODE STATUS: Full code.  TOTAL TIME TAKING CARE OF THIS PATIENT: 75 minutes.    Arta Silence M.D on 02/07/2018 at 2:09 AM  Between 7am to 6pm - Pager - [phone]  After 6pm go to [email or web address] - Proofreader  [facility]  Office  [phone]  CC: Primary care physician; Ro, [name], MD   Note: This dictation was prepared with  Dragon dictation along with smaller phrase technology. Any transcriptional errors that result from this process are unintentional.

## 2018-02-07 NOTE — ED Notes (Signed)
CBG 110 insulin not given at this time.

## 2018-02-08 ENCOUNTER — Encounter
Admission: EM | Disposition: A | Discharge status: Home / Self Care | Payer: Self-pay | Source: Home / Self Care | Attending: Internal Medicine

## 2018-02-08 DIAGNOSIS — I5042 Chronic combined systolic (congestive) and diastolic (congestive) heart failure: Secondary | ICD-10-CM | POA: Diagnosis present

## 2018-02-08 DIAGNOSIS — K219 Gastro-esophageal reflux disease without esophagitis: Secondary | ICD-10-CM | POA: Diagnosis present

## 2018-02-08 DIAGNOSIS — Z8249 Family history of ischemic heart disease and other diseases of the circulatory system: Secondary | ICD-10-CM | POA: Diagnosis not present

## 2018-02-08 DIAGNOSIS — Z79899 Other long term (current) drug therapy: Secondary | ICD-10-CM | POA: Diagnosis not present

## 2018-02-08 DIAGNOSIS — Z7982 Long term (current) use of aspirin: Secondary | ICD-10-CM | POA: Diagnosis not present

## 2018-02-08 DIAGNOSIS — Z9081 Acquired absence of spleen: Secondary | ICD-10-CM | POA: Diagnosis not present

## 2018-02-08 DIAGNOSIS — I959 Hypotension, unspecified: Secondary | ICD-10-CM | POA: Diagnosis not present

## 2018-02-08 DIAGNOSIS — Z87891 Personal history of nicotine dependence: Secondary | ICD-10-CM | POA: Diagnosis not present

## 2018-02-08 DIAGNOSIS — I255 Ischemic cardiomyopathy: Secondary | ICD-10-CM | POA: Diagnosis not present

## 2018-02-08 DIAGNOSIS — Z90710 Acquired absence of both cervix and uterus: Secondary | ICD-10-CM | POA: Diagnosis not present

## 2018-02-08 DIAGNOSIS — T82855A Stenosis of coronary artery stent, initial encounter: Secondary | ICD-10-CM | POA: Diagnosis present

## 2018-02-08 DIAGNOSIS — E1159 Type 2 diabetes mellitus with other circulatory complications: Secondary | ICD-10-CM | POA: Diagnosis not present

## 2018-02-08 DIAGNOSIS — F419 Anxiety disorder, unspecified: Secondary | ICD-10-CM | POA: Diagnosis present

## 2018-02-08 DIAGNOSIS — I200 Unstable angina: Secondary | ICD-10-CM | POA: Diagnosis not present

## 2018-02-08 DIAGNOSIS — R079 Chest pain, unspecified: Secondary | ICD-10-CM | POA: Diagnosis present

## 2018-02-08 DIAGNOSIS — I252 Old myocardial infarction: Secondary | ICD-10-CM | POA: Diagnosis not present

## 2018-02-08 DIAGNOSIS — I25110 Atherosclerotic heart disease of native coronary artery with unstable angina pectoris: Principal | ICD-10-CM

## 2018-02-08 DIAGNOSIS — E785 Hyperlipidemia, unspecified: Secondary | ICD-10-CM

## 2018-02-08 DIAGNOSIS — Y832 Surgical operation with anastomosis, bypass or graft as the cause of abnormal reaction of the patient, or of later complication, without mention of misadventure at the time of the procedure: Secondary | ICD-10-CM | POA: Diagnosis present

## 2018-02-08 DIAGNOSIS — G8929 Other chronic pain: Secondary | ICD-10-CM | POA: Diagnosis present

## 2018-02-08 DIAGNOSIS — M549 Dorsalgia, unspecified: Secondary | ICD-10-CM | POA: Diagnosis present

## 2018-02-08 DIAGNOSIS — Z794 Long term (current) use of insulin: Secondary | ICD-10-CM | POA: Diagnosis not present

## 2018-02-08 DIAGNOSIS — Z955 Presence of coronary angioplasty implant and graft: Secondary | ICD-10-CM | POA: Diagnosis not present

## 2018-02-08 DIAGNOSIS — J449 Chronic obstructive pulmonary disease, unspecified: Secondary | ICD-10-CM | POA: Diagnosis present

## 2018-02-08 DIAGNOSIS — E1165 Type 2 diabetes mellitus with hyperglycemia: Secondary | ICD-10-CM | POA: Diagnosis present

## 2018-02-08 HISTORY — PX: INTRAVASCULAR ULTRASOUND/IVUS: CATH118244

## 2018-02-08 HISTORY — PX: CORONARY STENT INTERVENTION: CATH118234

## 2018-02-08 HISTORY — PX: LEFT HEART CATH AND CORONARY ANGIOGRAPHY: CATH118249

## 2018-02-08 LAB — GLUCOSE, CAPILLARY
GLUCOSE-CAPILLARY: 211 mg/dL — AB (ref 70–99)
GLUCOSE-CAPILLARY: 364 mg/dL — AB (ref 70–99)
Glucose-Capillary: 121 mg/dL — ABNORMAL HIGH (ref 70–99)
Glucose-Capillary: 108 mg/dL — ABNORMAL HIGH (ref 70–99)

## 2018-02-08 LAB — POCT ACTIVATED CLOTTING TIME
ACTIVATED CLOTTING TIME: 257 s
ACTIVATED CLOTTING TIME: 241 s
Activated Clotting Time: 252 seconds

## 2018-02-08 SURGERY — LEFT HEART CATH AND CORONARY ANGIOGRAPHY
Anesthesia: Moderate Sedation

## 2018-02-08 MED ORDER — NITROGLYCERIN 5 MG/ML IV SOLN
INTRAVENOUS | Status: AC
  Filled 2018-02-08: qty 10

## 2018-02-08 MED ORDER — SODIUM CHLORIDE 0.9 % IV SOLN: 250.0000 mL | INTRAVENOUS | Status: DC | PRN

## 2018-02-08 MED ORDER — SODIUM CHLORIDE 0.9% FLUSH: 3.0000 mL | INTRAVENOUS | Status: DC | PRN

## 2018-02-08 MED ORDER — DIPHENHYDRAMINE HCL 50 MG/ML IJ SOLN
25.0000 mg | Freq: Once | INTRAMUSCULAR | Status: AC
  Administered 2018-02-08: 25 mg via INTRAVENOUS

## 2018-02-08 MED ORDER — MIDAZOLAM HCL 2 MG/2ML IJ SOLN
INTRAMUSCULAR | Status: AC
  Filled 2018-02-08: qty 2

## 2018-02-08 MED ORDER — ASPIRIN 81 MG PO CHEW
CHEWABLE_TABLET | ORAL | Status: AC
  Filled 2018-02-08: qty 1

## 2018-02-08 MED ORDER — SODIUM CHLORIDE 0.9 % IV SOLN: INTRAVENOUS | Status: AC

## 2018-02-08 MED ORDER — TICAGRELOR 90 MG PO TABS
ORAL_TABLET | ORAL | Status: AC
  Filled 2018-02-08: qty 1

## 2018-02-08 MED ORDER — DIPHENHYDRAMINE HCL 50 MG/ML IJ SOLN
INTRAMUSCULAR | Status: AC
  Administered 2018-02-08: 25 mg via INTRAVENOUS
  Filled 2018-02-08: qty 1

## 2018-02-08 MED ORDER — HEPARIN (PORCINE) IN NACL 1000-0.9 UT/500ML-% IV SOLN
INTRAVENOUS | Status: AC
  Filled 2018-02-08: qty 1000

## 2018-02-08 MED ORDER — HEPARIN SODIUM (PORCINE) 1000 UNIT/ML IJ SOLN
INTRAMUSCULAR | Status: AC
  Filled 2018-02-08: qty 1

## 2018-02-08 MED ORDER — ASPIRIN 81 MG PO CHEW
CHEWABLE_TABLET | ORAL | Status: DC | PRN
  Administered 2018-02-08: 81 mg via ORAL

## 2018-02-08 MED ORDER — TICAGRELOR 90 MG PO TABS
ORAL_TABLET | ORAL | Status: DC | PRN
  Administered 2018-02-08: 90 mg via ORAL

## 2018-02-08 MED ORDER — NITROGLYCERIN 1 MG/10 ML FOR IR/CATH LAB
INTRA_ARTERIAL | Status: DC | PRN
  Administered 2018-02-08: 200 ug via INTRACORONARY

## 2018-02-08 MED ORDER — HYDRALAZINE HCL 20 MG/ML IJ SOLN: 5.0000 mg | INTRAMUSCULAR | Status: AC | PRN

## 2018-02-08 MED ORDER — SODIUM CHLORIDE 0.9% FLUSH: 3.0000 mL | Freq: Two times a day (BID) | INTRAVENOUS | Status: DC

## 2018-02-08 MED ORDER — MIDAZOLAM HCL 2 MG/2ML IJ SOLN
INTRAMUSCULAR | Status: DC | PRN
  Administered 2018-02-08: 1 mg via INTRAVENOUS

## 2018-02-08 MED ORDER — FENTANYL CITRATE (PF) 100 MCG/2ML IJ SOLN
INTRAMUSCULAR | Status: AC
  Filled 2018-02-08: qty 2

## 2018-02-08 MED ORDER — FENTANYL CITRATE (PF) 100 MCG/2ML IJ SOLN
INTRAMUSCULAR | Status: DC | PRN
  Administered 2018-02-08 (×4): 25 ug via INTRAVENOUS

## 2018-02-08 MED ORDER — HEPARIN SODIUM (PORCINE) 1000 UNIT/ML IJ SOLN
INTRAMUSCULAR | Status: DC | PRN
  Administered 2018-02-08: 2000 [IU] via INTRAVENOUS
  Administered 2018-02-08: 1500 [IU] via INTRAVENOUS
  Administered 2018-02-08: 3000 [IU] via INTRAVENOUS
  Administered 2018-02-08: 1000 [IU] via INTRAVENOUS
  Administered 2018-02-08: 3000 [IU] via INTRAVENOUS

## 2018-02-08 MED ORDER — LABETALOL HCL 5 MG/ML IV SOLN: 10.0000 mg | INTRAVENOUS | Status: AC | PRN

## 2018-02-08 MED ORDER — OXYCODONE HCL 5 MG PO TABS
5.0000 mg | ORAL_TABLET | Freq: Four times a day (QID) | ORAL | Status: DC | PRN
  Administered 2018-02-08 – 2018-02-09 (×3): 5 mg via ORAL
  Filled 2018-02-08 (×4): qty 1

## 2018-02-08 MED ORDER — VERAPAMIL HCL 2.5 MG/ML IV SOLN
INTRAVENOUS | Status: AC
  Filled 2018-02-08: qty 2

## 2018-02-08 MED ORDER — VERAPAMIL HCL 2.5 MG/ML IV SOLN
INTRAVENOUS | Status: DC | PRN
  Administered 2018-02-08: 2.5 mg via INTRA_ARTERIAL

## 2018-02-08 MED ORDER — OXYCODONE HCL 5 MG PO TABS
ORAL_TABLET | ORAL | Status: AC
  Filled 2018-02-08: qty 1

## 2018-02-08 MED ORDER — ENOXAPARIN SODIUM 40 MG/0.4ML ~~LOC~~ SOLN
40.0000 mg | SUBCUTANEOUS | Status: DC
  Administered 2018-02-09: 40 mg via SUBCUTANEOUS
  Filled 2018-02-08 (×2): qty 0.4

## 2018-02-08 SURGICAL SUPPLY — 18 items
BALLN ~~LOC~~ EUPHORA RX 3.25X15 (BALLOONS) ×6
BALLN ~~LOC~~ EUPHORA RX 3.5X20 (BALLOONS) ×3
BALLN ~~LOC~~ TREK RX 3.75X8 (BALLOONS) ×3
BALLOON ~~LOC~~ EUPHORA RX 3.25X15 (BALLOONS) ×2 IMPLANT
BALLOON ~~LOC~~ EUPHORA RX 3.5X20 (BALLOONS) ×1 IMPLANT
BALLOON ~~LOC~~ TREK RX 3.75X8 (BALLOONS) ×1 IMPLANT
CATH EAGLE EYE PLAT IMAGING (CATHETERS) ×3 IMPLANT
CATH INFINITI 5 FR MPA2 (CATHETERS) ×3 IMPLANT
CATH LAUNCHER 6FR EBU 3 (CATHETERS) ×3 IMPLANT
DEVICE INFLAT 30 PLUS (MISCELLANEOUS) ×3 IMPLANT
DEVICE RAD TR BAND REGULAR (VASCULAR PRODUCTS) ×3 IMPLANT
KIT MANI 3VAL PERCEP (MISCELLANEOUS) ×3 IMPLANT
NEEDLE PERC 21GX4CM (NEEDLE) ×3 IMPLANT
PACK CARDIAC CATH (CUSTOM PROCEDURE TRAY) ×3 IMPLANT
SHEATH RAIN RADIAL 21G 6FR (SHEATH) ×3 IMPLANT
STENT RESOLUTE ONYX 3.5X12 (Permanent Stent) ×3 IMPLANT
WIRE ASAHI PROWATER 180CM (WIRE) ×3 IMPLANT
WIRE ROSEN-J .035X260CM (WIRE) ×3 IMPLANT

## 2018-02-08 NOTE — OR Nursing (Signed)
Pt requesting ativan (feeling jerky inside). Pharmacy to prepare not in pixis)

## 2018-02-08 NOTE — OR Nursing (Signed)
Reports itching relieved with benedryl

## 2018-02-08 NOTE — OR Nursing (Signed)
Flexeril po given (30 minutes after oxycodone) due to continued back pain

## 2018-02-08 NOTE — Interval H&P Note (Signed)
History and Physical Interval Note:  02/08/2018 11:37 AM  Renee Mack  has presented today for cardiac catheterization, with the diagnosis of unstable angina  The various methods of treatment have been discussed with the patient and family. After consideration of risks, benefits and other options for treatment, the patient has consented to  Procedure(s): LEFT HEART CATH AND CORONARY ANGIOGRAPHY (N/A) as a surgical intervention .  The patient's history has been reviewed, patient examined, no change in status, stable for surgery.  I have reviewed the patient's chart and labs.  Questions were answered to the patient's satisfaction.    Cath Lab Visit (complete for each Cath Lab visit)  Clinical Evaluation Leading to the Procedure:   ACS: Yes.    Non-ACS:  N/A  Renee Mack

## 2018-02-08 NOTE — Progress Notes (Addendum)
Pt is NPO and just got back from cardiac cath, but states shes hungry. Notify prime. Will continue to monitor.  Update 1855: Doctor Pyreddy ordered to change the pt diet from NPO to Carb diet. Will continue to monitor.  Update 1939: Pt BP was at 96/58 HR 71. Pt was asymtomatic. Notify prime. Will continue to monitor.   Update 1955: Doctor pyreddy called and ordered just monitor pt. Will continue to monitor.  Update 0358: Pt BP was at 93/57 and has a pain 8 out 10. Pt did have her morphine 2mg /ml at 1855 and oxycodone at 2247. Blood pressure was running a bit soft. Tech went to get her V/S, EKG and Weight, but pt refusing all of them. Page prime. Awaiting callback. Will continue to monitor.  Update 0415: Doctor Marcille Blanco ordered to give pt a bolus of 500 ml/hr.  Update 0515: Pt BP on manual was at 108/62. Pt states wants the morphine over oxycodone tablet to take care of her pain. Talked to doctor Marcille Blanco about it and he ordered to give morphine PRN. Will continue to monitor.  Update 0559: Pt did allow to get her EKG and daily weight after getting the morphine. Will continue to monitor.

## 2018-02-08 NOTE — Brief Op Note (Signed)
BRIEF CARDIAC CATHETERIZATION NOTE  DATE: 02/08/2018 TIME: 1:28 PM  PATIENT:  Renee Mack  52 y.o. female  PRE-OPERATIVE DIAGNOSIS:  Unstable angina  POST-OPERATIVE DIAGNOSIS:  Unstable angina  PROCEDURE:  Procedure(s): LEFT HEART CATH AND CORONARY ANGIOGRAPHY (N/A)  SURGEON:  Surgeon(s) and Role:    * Jasyah Theurer, Harrell Gave, MD - Primary  FINDINGS: 1. Significant 2-vessel CAD with 60% mid LAD disease in previously stented segment (unclear if ISR or nonocclusive thrombus, given recent stent placement). 2. Underexpanded proximal and mid LAD stents, which were post-dilated at high pressure up to 3.6 mm. 3. Successful PCI to proximal LAD overlapping previously placed stents using Resolute Onyx 3.5 x 12 mm DES (post-dilated to 3.9 mm). 4. Mildly reduced LVEF with anterior hypokinesis. 5. Normal LVEDP.  RECOMMENDATIONS: 1. At least 12 months of DAPT, ideally longer given long stented segment in LAD and history of subacute stent thrombosis. 2. Aggressive secondary prevention.  Nelva Bush, MD [facility] Pager: [phone]

## 2018-02-08 NOTE — Plan of Care (Signed)
  Problem: Activity: Goal: Risk for activity intolerance will decrease Outcome: Progressing Note:  Up to bathroom independently   Problem: Nutrition: Goal: Adequate nutrition will be maintained Outcome: Progressing Note:  Tolerated clear liquid diet last night   Problem: Elimination: Goal: Will not experience complications related to urinary retention Outcome: Progressing   Problem: Safety: Goal: Ability to remain free from injury will improve Outcome: Progressing   Problem: Activity: Goal: Ability to tolerate increased activity will improve Outcome: Progressing   Problem: Pain Managment: Goal: General experience of comfort will improve Outcome: Not Progressing Note:  Still having chest pain, treated with morphine twice with relief   Problem: Education: Goal: Knowledge of General Education information will improve Description Including pain rating scale, medication(s)/side effects and non-pharmacologic comfort measures Outcome: Completed/Met

## 2018-02-08 NOTE — Progress Notes (Signed)
Progress Note  Patient Name: Renee Mack Date of Encounter: 02/08/2018  Primary Cardiologist: [name], MD   Subjective   Patient noted some intermittent back pain as well as nausea overnight.  Following PCI today, symptoms have resolved.  She reported some itching at the Bryce Cheever of the catheterization that has since resolved with diphenhydramine.  Inpatient Medications    Scheduled Meds: . aspirin  81 mg Oral Daily  . atorvastatin  80 mg Oral q1800  . citalopram  20 mg Oral Daily  . digoxin  0.125 mg Oral Daily  . [START ON 02/09/2018] enoxaparin (LOVENOX) injection  40 mg Subcutaneous Q24H  . insulin aspart  0-15 Units Subcutaneous TID WC  . insulin aspart  0-5 Units Subcutaneous QHS  . insulin glargine  60 Units Subcutaneous QHS  . oxyCODONE      . pantoprazole  40 mg Oral Daily  . ranolazine  500 mg Oral BID  . sodium chloride flush  3 mL Intravenous Q12H  . ticagrelor  90 mg Oral BID   Continuous Infusions: . sodium chloride 75 mL/hr at 02/08/18 1336  . sodium chloride     PRN Meds: sodium chloride, acetaminophen, albuterol, bisacodyl, cyclobenzaprine, hydrALAZINE, labetalol, LORazepam, morphine injection, nitroGLYCERIN, ondansetron (ZOFRAN) IV, oxyCODONE, senna-docusate, sodium chloride flush   Vital Signs    Vitals:   02/08/18 1530 02/08/18 1600 02/08/18 1630 02/08/18 1700  BP:  103/66 113/62 119/62  Pulse: 76 66 64 63  Resp: 16 18 16 15   Temp:      TempSrc:      SpO2: 97% 97% 96% 95%  Weight:      Height:        Intake/Output Summary (Last 24 hours) at 02/08/2018 1755 Last data filed at 02/08/2018 0604 Gross per 24 hour  Intake 250 ml  Output 550 ml  Net -300 ml   Filed Weights   02/06/18 2234 02/07/18 1032 02/08/18 0851  Weight: 61.2 kg 62.3 kg 61.2 kg    Telemetry    Normal sinus rhythm - Personally Reviewed  ECG    Emil sinus rhythm with nonspecific T wave changes - Personally Reviewed  Physical Exam   GEN: No acute distress.     Neck: No JVD Cardiac: RRR, no murmurs, rubs, or gallops.  Right radial arteriotomy site covered with clean dressing.  No hematoma.  2+ right radial pulse. Respiratory: Clear to auscultation bilaterally. GI: Soft, nontender, non-distended  MS: No edema; No deformity. Neuro:  Nonfocal  Psych: Normal affect  Skin: No rash observed on trunk or extremities.  Labs    Chemistry Recent Labs  Lab 02/03/18 0847 02/06/18 2235  NA  --  137  K  --  3.6  CL  --  104  CO2  --  23  GLUCOSE  --  474*  BUN  --  11  CREATININE  --  0.62  CALCIUM  --  9.1  PROT 7.9 6.8  ALBUMIN 4.0 3.5  AST 27 28  ALT 51* 30  ALKPHOS 144* 101  BILITOT 0.5 0.5  GFRNONAA  --  >60  GFRAA  --  >60  ANIONGAP  --  10     Hematology Recent Labs  Lab 02/06/18 2235  WBC 10.1  RBC 4.05  HGB 13.2  HCT 38.2  MCV 94.2  MCH 32.6  MCHC 34.6  RDW 15.0*  PLT 250    Cardiac Enzymes Recent Labs  Lab 02/06/18 2235 02/07/18 0647 02/07/18 1302  TROPONINI <0.03 <  0.03 <0.03   No results for input(s): TROPIPOC in the last 168 hours.   BNPNo results for input(s): BNP, PROBNP in the last 168 hours.   DDimer No results for input(s): DDIMER in the last 168 hours.   Radiology    Dg Chest 2 View  Result Date: 02/06/2018 CLINICAL DATA:  52 y/o F; chest pain, shortness of breath, nausea, and vomiting. EXAM: CHEST - 2 VIEW COMPARISON:  01/26/2018 CT chest and chest radiograph FINDINGS: Stable normal cardiac silhouette. Aortic atherosclerosis with calcification. Electronic device multiple leads partially obscures the lungs. No consolidation identified. No pleural effusion or pneumothorax. No acute osseous abnormality is evident. IMPRESSION: No acute pulmonary process identified.  Aortic atherosclerosis. Electronically Signed   By: Kristine Garbe M.D.   On: 02/06/2018 22:58    Cardiac Studies   LHC/PCI (02/08/18): Conclusions: 1. Significant two-vessel coronary artery disease involving proximal and  midportion of previously stented LAD as well as nondominant RCA. 2. Patent stents in the proximal through distal LAD, with underexpansion in the proximal and mid portions as well as concern for area of early restenosis versus nonocclusive thrombus formation. 3. 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 4. Mildly reduced left ventricular systolic function with mid and apical anterior hypokinesis.  LVEF 45 to 50%. 5. Normal left ventricular systolic function. 6. Successful IVUS-guided PCI to LAD with stenting of the proximal LAD and angioplasty of the proximal and mid LAD stented segments with improved stent expansion and 0% residual stenosis.  Diagnostic Diagram       Post-Intervention Diagram          Patient Profile     52 y.o. female woman with history of coronary artery disease status post STEMI with occlusion of the proximal LAD in 10/352 complicated by subacute stent thrombosis 1 week later, admitted with recurrent chest pain concerning for unstable angina.  Assessment & Plan    Unstable angina Patient underwent catheterization today which revealed focal disease involving recently stented proximal/mid LAD as well as underexpansion of the stent.  Nondominant RCA disease was unchanged.  Patient underwent angioplasty to underexpanded proximal and mid LAD stents as well as placement of a new stent at the proximal edge.  She tolerated the procedure well and has had resolution of her pain and nausea.  Some itching noted at the Gatlyn Lipari of the case which is nonspecific.  However, given contrast exposure, she was given Benadryl and IV contrast added to her list of allergies.  Dual antiplatelet therapy with aspirin and ticagrelor for at least 12 months, though I recommend indefinite dual antiplatelet therapy given long stented segment of the LAD as well as recent subacute stent  thrombosis.  Aggressive secondary prevention with high intensity statin therapy.  Continue ranolazine from now for medical management of significant disease involving the nondominant RCA.  I would be hesitant to treat this percutaneously unless she has refractory chest pain.  Ischemic cardiomyopathy LVEF improved on left ventriculogram today.  Now, LVEF is 45 to 50%.  I will discontinue digoxin, given improved LVEF.  If blood pressure and heart rate tolerate, suggest addition of low-dose beta-blocker tomorrow.  No indication for diuresis given that patient appears euvolemic on exam and has normal LVEDP on catheterization today.  Hyperlipidemia LDL at goal (less than 70).  Continue atorvastatin 80 mg daily.  Diabetes mellitus  Continue medical therapy per internal medicine service.  Given cardiovascular disease, addition of empagliflozin  should be considered.  Disposition  If patient remains chest pain-free and is able to ambulate without difficulty, I anticipate discharge home tomorrow with close outpatient follow-up in our clinic.   For questions or updates, please contact [place] Please consult [email or web address] for contact info under [facility].     Signed, Nelva Bush, MD  02/08/2018, 5:55 PM

## 2018-02-09 ENCOUNTER — Other Ambulatory Visit: Payer: Self-pay | Admitting: *Deleted

## 2018-02-09 ENCOUNTER — Encounter: Payer: Self-pay | Admitting: Internal Medicine

## 2018-02-09 ENCOUNTER — Encounter: Payer: Self-pay | Admitting: *Deleted

## 2018-02-09 ENCOUNTER — Telehealth: Payer: Self-pay | Admitting: *Deleted

## 2018-02-09 DIAGNOSIS — E785 Hyperlipidemia, unspecified: Secondary | ICD-10-CM

## 2018-02-09 DIAGNOSIS — I5022 Chronic systolic (congestive) heart failure: Secondary | ICD-10-CM

## 2018-02-09 LAB — CBC
HEMATOCRIT: 38.3 % (ref 35.0–47.0)
HEMOGLOBIN: 13.1 g/dL (ref 12.0–16.0)
MCH: 31.6 pg (ref 26.0–34.0)
MCHC: 34.2 g/dL (ref 32.0–36.0)
MCV: 92.4 fL (ref 80.0–100.0)
Platelets: 234 10*3/uL (ref 150–440)
RBC: 4.14 MIL/uL (ref 3.80–5.20)
RDW: 14.6 % — ABNORMAL HIGH (ref 11.5–14.5)
WBC: 9.4 10*3/uL (ref 3.6–11.0)

## 2018-02-09 LAB — BASIC METABOLIC PANEL
ANION GAP: 7 (ref 5–15)
BUN: 15 mg/dL (ref 6–20)
CO2: 23 mmol/L (ref 22–32)
Calcium: 8.4 mg/dL — ABNORMAL LOW (ref 8.9–10.3)
Chloride: 109 mmol/L (ref 98–111)
Creatinine, Ser: 0.45 mg/dL (ref 0.44–1.00)
Glucose, Bld: 203 mg/dL — ABNORMAL HIGH (ref 70–99)
POTASSIUM: 4.4 mmol/L (ref 3.5–5.1)
SODIUM: 139 mmol/L (ref 135–145)

## 2018-02-09 LAB — GLUCOSE, CAPILLARY: Glucose-Capillary: 280 mg/dL — ABNORMAL HIGH (ref 70–99)

## 2018-02-09 MED ORDER — OXYCODONE HCL 5 MG PO TABS
5.0000 mg | ORAL_TABLET | Freq: Once | ORAL | Status: AC
  Administered 2018-02-09: 5 mg via ORAL

## 2018-02-09 MED ORDER — INSULIN GLARGINE 100 UNIT/ML ~~LOC~~ SOLN: 70.0000 [IU] | Freq: Every day | SUBCUTANEOUS | 0 refills | Status: DC

## 2018-02-09 MED ORDER — INSULIN GLARGINE 100 UNIT/ML ~~LOC~~ SOLN
70.0000 [IU] | Freq: Every day | SUBCUTANEOUS | Status: DC
  Filled 2018-02-09: qty 0.7

## 2018-02-09 MED ORDER — OXYCODONE HCL 5 MG PO TABS: 5.0000 mg | ORAL_TABLET | Freq: Three times a day (TID) | ORAL | 0 refills | Status: AC | PRN

## 2018-02-09 MED ORDER — METOPROLOL SUCCINATE ER 25 MG PO TB24: 25.0000 mg | ORAL_TABLET | Freq: Every day | ORAL | 0 refills | Status: DC

## 2018-02-09 MED ORDER — CYCLOBENZAPRINE HCL 10 MG PO TABS: 10.0000 mg | ORAL_TABLET | Freq: Three times a day (TID) | ORAL | 0 refills | Status: AC | PRN

## 2018-02-09 MED ORDER — SODIUM CHLORIDE 0.9 % IV BOLUS
500.0000 mL | Freq: Once | INTRAVENOUS | Status: AC
  Administered 2018-02-09: 500 mL via INTRAVENOUS

## 2018-02-09 MED ORDER — METOPROLOL SUCCINATE ER 25 MG PO TB24
25.0000 mg | ORAL_TABLET | Freq: Every day | ORAL | Status: DC
  Administered 2018-02-09: 25 mg via ORAL
  Filled 2018-02-09: qty 1

## 2018-02-09 NOTE — Discharge Summary (Signed)
[facility] NAME: Renee Mack    MR#:  [id number]  DATE OF BIRTH:  August 28, 1972  DATE OF ADMISSION:  02/06/2018 ADMITTING PHYSICIAN: Arta Silence, MD  DATE OF DISCHARGE: 02/09/2018   PRIMARY CARE PHYSICIAN: Ro, [name], MD    ADMISSION DIAGNOSIS:  Chest pain, unspecified type [R07.9] Nausea and vomiting, intractability of vomiting not specified, unspecified vomiting type [R11.2]  DISCHARGE DIAGNOSIS:  Active Problems:   Chest pain   CAD  SECONDARY DIAGNOSIS:   Past Medical History:  Diagnosis Date  . Cancer ([place])    vulvular  . Cervical disc disease    a. 01/2018 MRI Cervical spine: Cervical spondylosis with multilevel disc and facet degeneration greatest at C5-6 and C6-7.  Moderate to sev R C5-6, mod L C5-6, and mod bilat C6-7 foraminal stenosis with multilevel mild foraminal stenosis.  Mild C5-6 and mod C6-7 canal stenosis. C6-7 central cord impingement w/ cord flattening.  . Chronic combined systolic (congestive) and diastolic (congestive) heart failure ([place])    a. 12/2017 Echo: EF 30-35%, mid-apicalanteroseptal, ant, and apical AK. Gr1 DD. Mild conc LVH.  Marland Kitchen COPD (chronic obstructive pulmonary disease) (H[facility])    not on home oxygen  . Coronary artery disease    a. 12/2017 ACS/PCI: LM nl, LAD 100p (2.25x26 Resolute Onyx DES), 10m(2.0x12 Resolute Onyx DES), 80d (2.0x15 Resolute Onyx DES), LCX 30p, RCA 90p (non-dominant). EF 25-35%. Post-MI course complicated by cardiogenic shock req vasopressor Rx; b. 12/2017 NSTEMI/subacute thrombosis-->LAD 100 (PTCA + DES x 1).  . [place]   . GERD (gastroesophageal reflux disease)   . HLD (hyperlipidemia)   . Ischemic cardiomyopathy    a. 12/2017 Echo: EF 30-35%.  . Myocardial infarction ([facility]    a. 12/2017-->DES to LAD x 3.  . Pancreatitis   . Shingles   . Tobacco abuse     HOSPITAL COURSE:   * CAD involving the native coronary arteries  withunstableangina: - 3 troponin negative. -Continue ASA 81 mg daily and Brilinta 90 mg bid -Pressures have been somewhat soft limiting addition of beta blocker or isosorbide for anti-anginal effect -MaintainRanexa at 500 mg bid - Cath 02/08/18- with stent in LAD done. - pt is asymptomatic now. - added small dose of metoprolol.  2. HFrEF secondary to ICM: - Stable. - unable to add  beta blocker, ACEi/ARB/Entresto, and spironolactone given her relative hypotension -Continue digoxin, check a level -Recent echo as above, no need to repeat at this time -LifeVest   3. HLD: -Recent lipid panel from 02/03/2018 showed an LDL of 60 with mildly elevated alk phos and ALT at that time -Follow up LFT normal  -Continue Lipitor 80 mg daily -Decrease high sugar foods and drinks  4. COPD secondary to tobacco abuse: - Cont inhalers.  5. DM2: -Poorly controlled - cont lantus, advised diet control, ISS  Increased lantus from 60- to 70 U daily  6. Anxiety: - ativan PRN  7. Chronic back pain    As per pt, she have spinal stenosis, she had appointment yesterday with neuro clinic but she missed She ran out of her oxycodon and Flexaryl, asking for a few days until seen by neuro clinic- will give 7 day supply.  DISCHARGE CONDITIONS:   Stable.  CONSULTS OBTAINED:  Treatment Team:  SArta Silence MD End, CHarrell Gave MD  DRUG ALLERGIES:   Allergies  Allergen Reactions  . Bee Venom Itching, Shortness Of Breath and Swelling  .  Metformin And Related Shortness Of Breath  . Contrast Allergy Premed Pack [Prednisone & Diphenhydramine]     Rash and itching  . Darvon [Propoxyphene] Itching  . Gabapentin Swelling  . Nsaids Other (See Comments)    Ulcers   . Tramadol Hives    DISCHARGE MEDICATIONS:   Allergies as of 02/09/2018      Reactions   Bee Venom Itching, Shortness Of Breath, Swelling   Metformin And Related Shortness Of Breath   Contrast Allergy Premed Pack  [prednisone & Diphenhydramine]    Rash and itching   Darvon [propoxyphene] Itching   Gabapentin Swelling   Nsaids Other (See Comments)   Ulcers   Tramadol Hives      Medication List    TAKE these medications   acetaminophen 325 MG tablet Commonly known as:  TYLENOL Take 650 mg by mouth every 6 (six) hours as needed for mild pain.   aspirin 81 MG tablet Take 81 mg by mouth daily.   atorvastatin 80 MG tablet Commonly known as:  LIPITOR Take 1 tablet (80 mg total) by mouth daily at 6 PM.   citalopram 20 MG tablet Commonly known as:  CELEXA Take 20 mg daily by mouth.   cyclobenzaprine 10 MG tablet Commonly known as:  FLEXERIL Take 1 tablet (10 mg total) by mouth 3 (three) times daily as needed for up to 10 days for muscle spasms.   digoxin 0.125 MG tablet Commonly known as:  LANOXIN Take 1 tablet (0.125 mg total) by mouth daily.   esomeprazole 40 MG capsule Commonly known as:  NEXIUM Take 40 mg by mouth daily at 12 noon.   insulin aspart 100 UNIT/ML injection Commonly known as:  novoLOG Inject 10 Units into the skin 3 (three) times daily before meals. Take 10 units PLUS sliding scale   insulin glargine 100 UNIT/ML injection Commonly known as:  LANTUS Inject 0.7 mLs (70 Units total) into the skin at bedtime. What changed:  how much to take   LORazepam 1 MG tablet Commonly known as:  ATIVAN Take 1 tablet (1 mg total) by mouth every 8 (eight) hours.   metoprolol succinate 25 MG 24 hr tablet Commonly known as:  TOPROL-XL Take 1 tablet (25 mg total) by mouth daily.   ondansetron 4 MG tablet Commonly known as:  ZOFRAN Take 1 tablet (4 mg total) by mouth daily as needed.   oxyCODONE 5 MG immediate release tablet Commonly known as:  Oxy IR/ROXICODONE Take 1 tablet (5 mg total) by mouth every 8 (eight) hours as needed for up to 7 days for moderate pain or severe pain.   PROAIR HFA 108 (90 Base) MCG/ACT inhaler Generic drug:  albuterol Inhale 1 puff into the lungs  every 6 (six) hours as needed for wheezing or shortness of breath.   ranolazine 500 MG 12 hr tablet Commonly known as:  RANEXA Take 1 tablet (500 mg total) by mouth 2 (two) times daily.   ticagrelor 90 MG Tabs tablet Commonly known as:  BRILINTA Take 1 tablet (90 mg total) by mouth 2 (two) times daily.        DISCHARGE INSTRUCTIONS:    Follow with Cardio clinic in 1 week. If you experience worsening of your admission symptoms, develop shortness of breath, life threatening emergency, suicidal or homicidal thoughts you must seek medical attention immediately by calling 911 or calling your MD immediately  if symptoms less severe.  You Must read complete instructions/literature along with all the possible adverse reactions/side effects  for all the Medicines you take and that have been prescribed to you. Take any new Medicines after you have completely understood and accept all the possible adverse reactions/side effects.   Please note  You were cared for by a hospitalist during your hospital stay. If you have any questions about your discharge medications or the care you received while you were in the hospital after you are discharged, you can call the unit and asked to speak with the hospitalist on call if the hospitalist that took care of you is not available. Once you are discharged, your primary care physician will handle any further medical issues. Please note that NO REFILLS for any discharge medications will be authorized once you are discharged, as it is imperative that you return to your primary care physician (or establish a relationship with a primary care physician if you do not have one) for your aftercare needs so that they can reassess your need for medications and monitor your lab values.    Today   CHIEF COMPLAINT:   Chief Complaint  Patient presents with  . Chest Pain  . Nausea  . Vomiting    HISTORY OF PRESENT ILLNESS:  Renee Mack  is a 52 y.o. female with a  known history of multi-vessel CAD (recent cath 12/26/2017 by Dr. Fletcher Anon), chronic systolic + diastolic CHF (EF 15-94% w/ grade 1 diastolic dysfxn as of 58/59/2924 Echo; LifeVest), T2IDDM p/w 1d Hx heartburn/indigestion, N/V, CP. Pt states that she had poor appetite and minimal PO intake throughout the day yesterday (Mon 02/06/2018). She states that at some point in the late afternoon/early evening (she is unsure what time), she developed heartburn/indigestion, for which she took Nexium and Tums. Symptoms did not improve. She then had 4-5 episodes of N/V, w/ vomitus characterized as clear liquid, (-) blood. She developed midchest and B/L upper chest tightness/discomfort radiating into the R neck, which she states was persistent and not related to movement, position change, exertion or cough/inspiration. She says she lied down to rest for some time, but had persistent pain, as well as continued nausea and dry heaving. She endorses palpitations and diaphoresis, but denies F/C, cough/SOB, AP, diarrhea. Had recent hospitalization (08/08-08/10) for evaluation of CP. Pt is comfortable, well-appearing and is not in distress at the time of my assessment.   VITAL SIGNS:  Blood pressure 122/66, pulse 68, temperature 97.7 F (36.5 C), temperature source Oral, resp. rate 15, height 5' 1"  (1.549 m), weight 63 kg, SpO2 100 %.  I/O:    Intake/Output Summary (Last 24 hours) at 02/09/2018 0923 Last data filed at 02/09/2018 4628 Gross per 24 hour  Intake 569.6 ml  Output 2350 ml  Net -1780.4 ml    PHYSICAL EXAMINATION:  GENERAL:  52 y.o.-year-old patient lying in the bed with no acute distress.  EYES: Pupils equal, round, reactive to light and accommodation. No scleral icterus. Extraocular muscles intact.  HEENT: Head atraumatic, normocephalic. Oropharynx and nasopharynx clear.  NECK:  Supple, no jugular venous distention. No thyroid enlargement, no tenderness.  LUNGS: Normal breath sounds bilaterally, no wheezing,  rales,rhonchi or crepitation. No use of accessory muscles of respiration.  CARDIOVASCULAR: S1, S2 normal. No murmurs, rubs, or gallops.  ABDOMEN: Soft, non-tender, non-distended. Bowel sounds present. No organomegaly or mass.  EXTREMITIES: No pedal edema, cyanosis, or clubbing.  NEUROLOGIC: Cranial nerves II through XII are intact. Muscle strength 5/5 in all extremities. Sensation intact. Gait not checked.  PSYCHIATRIC: The patient is alert and oriented x 3.  SKIN: No obvious rash, lesion, or ulcer.   DATA REVIEW:   CBC Recent Labs  Lab 02/09/18 0527  WBC 9.4  HGB 13.1  HCT 38.3  PLT 234    Chemistries  Recent Labs  Lab 02/06/18 2235 02/09/18 0527  NA 137 139  K 3.6 4.4  CL 104 109  CO2 23 23  GLUCOSE 474* 203*  BUN 11 15  CREATININE 0.62 0.45  CALCIUM 9.1 8.4*  AST 28  --   ALT 30  --   ALKPHOS 101  --   BILITOT 0.5  --     Cardiac Enzymes Recent Labs  Lab 02/07/18 1302  TROPONINI <0.03    Microbiology Results  Results for orders placed or performed during the hospital encounter of 01/05/18  Urine Culture     Status: Abnormal   Collection Time: 01/05/18  1:07 PM  Result Value Ref Range Status   Specimen Description   Final    URINE, RANDOM Performed at [facility], [address], [place], [place] [address]    Special Requests   Final    NONE Performed at [facility], [address] [place], [place] [address]    Culture 60,000 COLONIES/mL STAPHYLOCOCCUS HOMINIS (A)  Final   Report Status 01/08/2018 FINAL  Final   Organism ID, Bacteria STAPHYLOCOCCUS HOMINIS (A)  Final      Susceptibility   Staphylococcus hominis - MIC*    CIPROFLOXACIN >=8 RESISTANT Resistant     GENTAMICIN 1 SENSITIVE Sensitive     NITROFURANTOIN <=16 SENSITIVE Sensitive     OXACILLIN >=4 RESISTANT Resistant     TETRACYCLINE >=16 RESISTANT Resistant     VANCOMYCIN 1 SENSITIVE Sensitive     TRIMETH/SULFA 40 SENSITIVE Sensitive     CLINDAMYCIN >=8 RESISTANT  Resistant     RIFAMPIN <=0.5 SENSITIVE Sensitive     Inducible Clindamycin NEGATIVE Sensitive     * 60,000 COLONIES/mL STAPHYLOCOCCUS HOMINIS    RADIOLOGY:  No results found.  EKG:   Orders placed or performed during the hospital encounter of 02/06/18  . ED EKG within 10 minutes  . ED EKG within 10 minutes  . EKG 12-Lead  . EKG 12-Lead  . EKG 12-Lead immediately post procedure  . EKG 12-Lead  . EKG 12-Lead immediately post procedure  . EKG 12-Lead  . EKG 12-Lead  . EKG 12-Lead  . EKG 12-Lead  . EKG 12-Lead      Management plans discussed with the patient, family and they are in agreement.  CODE STATUS:     Code Status Orders  (From admission, onward)         Start     Ordered   02/07/18 0631  Full code  Continuous     02/07/18 0630        Code Status History    Date Active Date Inactive Code Status Order ID Comments User Context   01/26/2018 0811 01/28/2018 1803 Full Code [id number]  Harrie Foreman, MD Inpatient   01/15/2018 1342 01/17/2018 1617 Full Code [id number]  Demetrios Loll, MD Inpatient   12/26/2017 1538 12/30/2017 1928 Full Code [id number]  Wellington Hampshire, MD Inpatient   12/26/2017 1537 12/26/2017 1538 Full Code [id number]  Vaughan Basta, MD Inpatient   12/19/2017 0839 12/21/2017 1940 Full Code [id number]  Wellington Hampshire, MD Inpatient   11/03/2017 1031 11/04/2017 1850 Full Code [id number]  Fritzi Mandes, MD Inpatient   09/28/2017 1044 09/29/2017 2121 Full Code [id number]  Harrie Foreman,  MD ED   07/17/2017 2004 07/20/2017 1840 Full Code [id number]  Henreitta Leber, MD ED   06/12/2017 2227 06/16/2017 1910 Full Code [id number]  Gorden Harms, MD Inpatient   12/28/2014 0305 12/28/2014 1919 Full Code [id number]  Lance Coon, MD Inpatient      TOTAL TIME TAKING CARE OF THIS PATIENT: 35 minutes.    Vaughan Basta M.D on 02/09/2018 at 9:23 AM  Between 7am to 6pm - Pager - [phone]  After 6pm go to [email or web address] - password [email or web address]  Office  [phone]  CC: Primary care physician; Ro, [name], MD   Note: This dictation was prepared with Dragon dictation along with smaller phrase technology. Any transcriptional errors that result from this process are unintentional.

## 2018-02-09 NOTE — Telephone Encounter (Signed)
Patient currently admitted

## 2018-02-09 NOTE — Plan of Care (Signed)
  Problem: Health Behavior/Discharge Planning: Goal: Ability to manage health-related needs will improve Outcome: Progressing   Problem: Clinical Measurements: Goal: Ability to maintain clinical measurements within normal limits will improve Outcome: Progressing   Problem: Pain Managment: Goal: General experience of comfort will improve Outcome: Progressing   Problem: Activity: Goal: Ability to return to baseline activity level will improve Outcome: Progressing   Problem: Cardiovascular: Goal: Vascular access site(s) Level 0-1 will be maintained Outcome: Progressing

## 2018-02-09 NOTE — Telephone Encounter (Signed)
-----   Message from Blain Pais sent at 02/09/2018  8:38 AM EDT ----- Regarding: toc 9/3 10 Murray Hodgkins, NP

## 2018-02-09 NOTE — Progress Notes (Signed)
Progress Note  Patient Name: Renee Mack Date of Encounter: 02/09/2018  Primary Cardiologist: [name], MD  Subjective   No chest pain or dyspnea overnight.  Hasn't been up yet this AM.  R wrist feels ok.  Inpatient Medications    Scheduled Meds: . aspirin  81 mg Oral Daily  . atorvastatin  80 mg Oral q1800  . citalopram  20 mg Oral Daily  . enoxaparin (LOVENOX) injection  40 mg Subcutaneous Q24H  . insulin aspart  0-15 Units Subcutaneous TID WC  . insulin aspart  0-5 Units Subcutaneous QHS  . insulin glargine  60 Units Subcutaneous QHS  . pantoprazole  40 mg Oral Daily  . ranolazine  500 mg Oral BID  . sodium chloride flush  3 mL Intravenous Q12H  . ticagrelor  90 mg Oral BID   Continuous Infusions: . sodium chloride     PRN Meds: sodium chloride, acetaminophen, albuterol, bisacodyl, cyclobenzaprine, LORazepam, morphine injection, nitroGLYCERIN, ondansetron (ZOFRAN) IV, oxyCODONE, senna-docusate, sodium chloride flush   Vital Signs    Vitals:   02/09/18 0513 02/09/18 0515 02/09/18 0602 02/09/18 0619  BP: (!) 99/59 108/62  (!) 104/56  Pulse: 64  65 (!) 59  Resp:      Temp: 97.7 F (36.5 C)  (!) 97.5 F (36.4 C)   TempSrc: Oral  Oral   SpO2: 99%  98%   Weight:   63 kg   Height:        Intake/Output Summary (Last 24 hours) at 02/09/2018 0737 Last data filed at 02/09/2018 2774 Gross per 24 hour  Intake 569.6 ml  Output 2350 ml  Net -1780.4 ml   Filed Weights   02/07/18 1032 02/08/18 0851 02/09/18 0602  Weight: 62.3 kg 61.2 kg 63 kg    Physical Exam   GEN: Well nourished, well developed, in no acute distress.  HEENT: Grossly normal.  Neck: Supple, no JVD, carotid bruits, or masses. Cardiac: RRR, no murmurs, rubs, or gallops. No clubbing, cyanosis, edema.  Radials/DP/PT 2+ and equal bilaterally. R wrist cath site w/o bleeding/bruit/hematoma. Respiratory:  Respirations regular and unlabored, clear to auscultation bilaterally. GI: Soft, nontender,  nondistended, BS + x 4. MS: no deformity or atrophy. Skin: warm and dry, no rash. Neuro:  Strength and sensation are intact. Psych: AAOx3.  Normal affect.  Labs    Chemistry Recent Labs  Lab 02/03/18 0847 02/06/18 2235 02/09/18 0527  NA  --  137 139  K  --  3.6 4.4  CL  --  104 109  CO2  --  23 23  GLUCOSE  --  474* 203*  BUN  --  11 15  CREATININE  --  0.62 0.45  CALCIUM  --  9.1 8.4*  PROT 7.9 6.8  --   ALBUMIN 4.0 3.5  --   AST 27 28  --   ALT 51* 30  --   ALKPHOS 144* 101  --   BILITOT 0.5 0.5  --   GFRNONAA  --  >60 >60  GFRAA  --  >60 >60  ANIONGAP  --  10 7     Hematology Recent Labs  Lab 02/06/18 2235 02/09/18 0527  WBC 10.1 9.4  RBC 4.05 4.14  HGB 13.2 13.1  HCT 38.2 38.3  MCV 94.2 92.4  MCH 32.6 31.6  MCHC 34.6 34.2  RDW 15.0* 14.6*  PLT 250 234    Cardiac Enzymes Recent Labs  Lab 02/06/18 2235 02/07/18 0647 02/07/18 1302  TROPONINI <0.03 <  0.03 <0.03     Radiology    No results found.  Telemetry    rsr - Personally Reviewed  ECG    Sinus brady, 59, LAD, LAFB, anterolateral TWI - Personally Reviewed  Cardiac Studies   Cardiac Catheterization and Percutaneous Coronary Intervention 8.21.2019  Left Main  Vessel is large. The vessel exhibits minimal luminal irregularities.  Left Anterior Descending  Vessel is large.  Prox LAD lesion 50% stenosed  Prox LAD lesion is 50% stenosed. The lesion is eccentric.  Prox LAD to Dist LAD lesion 60% stenosed  Prox LAD to Dist LAD lesion is 60% stenosed. Vessel is the culprit lesion. The lesion was previously treated using a drug eluting stent between 1-5 months ago. Ultrasound (IVUS) was performed. Severe plaque burden was detected. IVUS has determined that the lesion is heterogeneous. Multiple layers of overlapping drug-eluting stent extending from the proximal LAD through the distal vessel. In the proximal segment, there is a focal eccentric hazy lesion with at least 50 to 60% stenosis and  while this could represent in-stent restenosis, given the relatively recent stent placement, I am concerned this represents nonocclusive thrombus. Intravascular ultrasound demonstrates focal area of severe stenosis at this location. Proximal and mid segments of the stented LAD are significantly underexpanded with reference vessel diameters of 3 (distal) to 4+ mm (proximal).  First Diagonal Branch  Vessel is small in size.  Second Diagonal Branch  Vessel is small in size.  Ost 2nd Diag to 2nd Diag lesion 90% stenosed  Ost 2nd Diag to 2nd Diag lesion is 90% stenosed.  Third Diagonal Branch  Vessel is small in size.  Left Circumflex  Vessel is large.  Prox Cx lesion 30% stenosed  Prox Cx lesion is 30% stenosed.  Left Posterior Descending Artery  The vessel exhibits minimal luminal irregularities.  First Left Posterolateral Branch  The vessel exhibits minimal luminal irregularities.  Second Left Posterolateral Branch  The vessel exhibits minimal luminal irregularities.  Right Coronary Artery  Vessel is small. Incidental note made of aberrant vessel extending from the proximal RCA to another vascular structure, likely a pulmonary artery.  Prox RCA lesion 90% stenosed  Prox RCA lesion is 90% stenosed.   Prox LAD lesion  Stent  A drug-eluting stent was successfully placed using a STENT RESOLUTE ONYX 3.5X12. See technique section for details of intervention.  Post-Intervention Lesion Assessment  The intervention was successful. Pre-interventional TIMI flow is 3. Post-intervention TIMI flow is 3. No complications occurred at this lesion.  There is a 0% residual stenosis post intervention.  Prox LAD to Dist LAD lesion  Angioplasty  Lesion length: 35 mm. Balloon angioplasty was performed. See technique section for details of intervention.  Post-Intervention Lesion Assessment  The intervention was successful. Pre-interventional TIMI flow is 3. Post-intervention TIMI flow is 3. No complications  occurred at this lesion.  _____________    Patient Profile     52 y.o. female with a hx of CAD status post recent myocardial infarction and drug-eluting stent placement to the LAD x3 with subsequent readmission for subacute stent thrombosis and repeat PCI, combined CHF/ischemic cardiomyopathy with an EF of 30 to 35%, tobacco abuse, COPD, hyperlipidemia, GERD, type 2 diabetes mellitus, and pancreatitis who was readmitted 8/19 with recurrent c/p and required repeat PCI and stenting to the LAD.  Assessment & Plan    1.  [place]/CAD: s/p prior ant STEMI w/ LAD stenting x 3 followed by NSTEMI in the setting of subacute stent thrombosis req PTCA and repeat  stenting of the prox/mid LAD in early/mid July.  Readmitted 8/19 with recurrent chest/neck/shoulder pain similar to prior angina.  Trop nl.  Cath on 8/21 with recurrent nonocclusive stent thrombosis and note of poor apposition of the prox/mid LAD stents  DES x 1 with PTCA. No chest/anterior neck pain overnight (does have posterior neck pain which is different from angina). Ambulate this AM. Cont asa, statin, ranexa, brilinta.  Ongoing soft BP's prevents use of  blocker.  Likely home later this morning if stable.  We will arrange for f/u in 7-14 days.  2.  ICM/HFrEF:  Euvolemic. EF prev 30-35%, now 45-50% by LV gram.  As above, soft BPs have prevented [place] from using  blocker, acei/arb/arni/mra.  3.  Relative hypotension:  Stable.  Asymptomatic.  4.  Tob Abuse:  Quit in July.  5.  HL:  LDL 60 on 8/16.  LFTs wnl.  Cont statin therapy.  6.  Type II DM:  Lantus per IM.  Signed, Murray Hodgkins, NP  02/09/2018, 7:37 AM    For questions or updates, please contact   Please consult [email or web address] for contact info under Cardiology/STEMI.

## 2018-02-09 NOTE — Progress Notes (Signed)
[facility] [place] NAME: Renee Mack    MR#:  [id number]  DATE OF BIRTH:  05-16-1966  SUBJECTIVE:  CHIEF COMPLAINT:   Chief Complaint  Patient presents with  . Chest Pain  . Nausea  . Vomiting   Extensive history of coronary artery disease, came with chest pain again.  Continued to have complaint of chest pain Seen after cath- LAD stent , complains resolved.  REVIEW OF SYSTEMS:  CONSTITUTIONAL: No fever, fatigue or weakness.  EYES: No blurred or double vision.  EARS, NOSE, AND THROAT: No tinnitus or ear pain.  RESPIRATORY: No cough, shortness of breath, wheezing or hemoptysis.  CARDIOVASCULAR: have chest pain,no orthopnea, edema.  GASTROINTESTINAL: No nausea, vomiting, diarrhea or abdominal pain.  GENITOURINARY: No dysuria, hematuria.  ENDOCRINE: No polyuria, nocturia,  HEMATOLOGY: No anemia, easy bruising or bleeding SKIN: No rash or lesion. MUSCULOSKELETAL: No joint pain or arthritis.   NEUROLOGIC: No tingling, numbness, weakness.  PSYCHIATRY: No anxiety or depression.   ROS  DRUG ALLERGIES:   Allergies  Allergen Reactions  . Bee Venom Itching, Shortness Of Breath and Swelling  . Metformin And Related Shortness Of Breath  . Contrast Allergy Premed Pack [Prednisone & Diphenhydramine]     Rash and itching  . Darvon [Propoxyphene] Itching  . Gabapentin Swelling  . Nsaids Other (See Comments)    Ulcers   . Tramadol Hives    VITALS:  Blood pressure 122/66, pulse 68, temperature 97.7 F (36.5 C), temperature source Oral, resp. rate 15, height 5' 1"  (1.549 m), weight 63 kg, SpO2 100 %.  PHYSICAL EXAMINATION:  GENERAL:  52 y.o.-year-old patient lying in the bed with no acute distress.  EYES: Pupils equal, round, reactive to light and accommodation. No scleral icterus. Extraocular muscles intact.  HEENT: Head atraumatic, normocephalic. Oropharynx and nasopharynx clear.  NECK:  Supple, no jugular venous distention. No  thyroid enlargement, no tenderness.  LUNGS: Normal breath sounds bilaterally, no wheezing, rales,rhonchi or crepitation. No use of accessory muscles of respiration.  CARDIOVASCULAR: S1, S2 normal. No murmurs, rubs, or gallops.  ABDOMEN: Soft, nontender, nondistended. Bowel sounds present. No organomegaly or mass.  EXTREMITIES: No pedal edema, cyanosis, or clubbing.  NEUROLOGIC: Cranial nerves II through XII are intact. Muscle strength 5/5 in all extremities. Sensation intact. Gait not checked.  PSYCHIATRIC: The patient is alert and oriented x 3.  SKIN: No obvious rash, lesion, or ulcer.   Physical Exam LABORATORY PANEL:   CBC Recent Labs  Lab 02/09/18 0527  WBC 9.4  HGB 13.1  HCT 38.3  PLT 234   ------------------------------------------------------------------------------------------------------------------  Chemistries  Recent Labs  Lab 02/06/18 2235 02/09/18 0527  NA 137 139  K 3.6 4.4  CL 104 109  CO2 23 23  GLUCOSE 474* 203*  BUN 11 15  CREATININE 0.62 0.45  CALCIUM 9.1 8.4*  AST 28  --   ALT 30  --   ALKPHOS 101  --   BILITOT 0.5  --    ------------------------------------------------------------------------------------------------------------------  Cardiac Enzymes Recent Labs  Lab 02/07/18 0647 02/07/18 1302  TROPONINI <0.03 <0.03   ------------------------------------------------------------------------------------------------------------------  RADIOLOGY:  No results found.  ASSESSMENT AND PLAN:   Active Problems:   Chest pain  *  CAD involving the native coronary arteries with unstable angina: - 3 troponin negative. -Continue ASA 81 mg daily and Brilinta 90 mg bid -Pressures have been somewhat soft limiting addition of beta blocker or isosorbide for anti-anginal effect -Maintain Ranexa at 500 mg  bid - Cath 02/08/18- with stent in LAD done.  2. HFrEF secondary to ICM: - Stable. - unable to add  beta blocker, ACEi/ARB/Entresto, and  spironolactone given her relative hypotension -Continue digoxin, check a level -Recent echo as above, no need to repeat at this time -LifeVest   3. HLD: -Recent lipid panel from 02/03/2018 showed an LDL of 60 with mildly elevated alk phos and ALT at that time -Follow up LFT normal  -Continue Lipitor 80 mg daily -Decrease high sugar foods and drinks  4. COPD secondary to tobacco abuse: - Cont inhalers.  5. DM2: -Poorly controlled - cont lantus, advised diet control, ISS  6. Anxiety: - ativan PRN    All the records are reviewed and case discussed with Care Management/Social Workerr. Management plans discussed with the patient, family and they are in agreement.  CODE STATUS: full.  TOTAL TIME TAKING CARE OF THIS PATIENT: 35 minutes.     POSSIBLE D/C IN 1-2 DAYS, DEPENDING ON CLINICAL CONDITION.   Vaughan Basta M.D on 02/09/2018   Between 7am to 6pm - Pager - [phone]  After 6pm go to [email or web address] - password [email or web address] [facility]  Office  [phone]  CC: Primary care physician; Ro, [name], MD  Note: This dictation was prepared with Dragon dictation along with smaller phrase technology. Any transcriptional errors that result from this process are unintentional.

## 2018-02-10 NOTE — Telephone Encounter (Signed)
No answer/Voicemail box is full.  

## 2018-02-14 NOTE — Telephone Encounter (Signed)
No answer. Left message to call back.   

## 2018-02-15 NOTE — Telephone Encounter (Addendum)
Attempted to call the patient. Unable to leave a message to call back as the "mailbox is full."  3 attempts made to contact the patient for TCM follow up. Closing this encounter.

## 2018-02-21 ENCOUNTER — Ambulatory Visit (INDEPENDENT_AMBULATORY_CARE_PROVIDER_SITE_OTHER): Payer: Medicaid Other | Admitting: Nurse Practitioner

## 2018-02-21 ENCOUNTER — Encounter: Payer: Self-pay | Admitting: Nurse Practitioner

## 2018-02-21 VITALS — BP 116/64 | HR 84 | Ht 61.0 in | Wt 138.5 lb

## 2018-02-21 DIAGNOSIS — I255 Ischemic cardiomyopathy: Secondary | ICD-10-CM

## 2018-02-21 DIAGNOSIS — Z794 Long term (current) use of insulin: Secondary | ICD-10-CM

## 2018-02-21 DIAGNOSIS — E119 Type 2 diabetes mellitus without complications: Secondary | ICD-10-CM

## 2018-02-21 DIAGNOSIS — E785 Hyperlipidemia, unspecified: Secondary | ICD-10-CM

## 2018-02-21 DIAGNOSIS — I2510 Atherosclerotic heart disease of native coronary artery without angina pectoris: Secondary | ICD-10-CM | POA: Diagnosis not present

## 2018-02-21 DIAGNOSIS — I5022 Chronic systolic (congestive) heart failure: Secondary | ICD-10-CM

## 2018-02-21 MED ORDER — METOPROLOL SUCCINATE ER 25 MG PO TB24: 25.0000 mg | ORAL_TABLET | Freq: Every day | ORAL | 3 refills | Status: DC

## 2018-02-21 NOTE — Patient Instructions (Signed)
Medication Instructions:  Your physician recommends that you continue on your current medications as directed. Please refer to the Current Medication list given to you today.   Labwork: NONE  Testing/Procedures: NONE  Follow-Up: Your physician recommends that you schedule a follow-up appointment in: [place].  If you need a refill on your cardiac medications before your next appointment, please call your pharmacy.

## 2018-02-21 NOTE — Progress Notes (Signed)
Office Visit    Patient Name: Renee Mack Date of Encounter: 02/21/2018  Primary Care Provider:  Janalyn Shy, MD Primary Cardiologist:  [name], MD  Chief Complaint    52 year old female with a history of CAD status post anterior MI with drug-eluting stent placement to the LAD x3 with subsequent readmissions due to subacute stent thrombosis, combined CHF/ischemic cardiomyopathy, tobacco abuse, COPD, hyperlipidemia, GERD, type 2 diabetes mellitus, and pancreatitis, who presents for follow-up after repeat hospitalization for unstable angina with repeat PCI to the LAD.  Past Medical History    Past Medical History:  Diagnosis Date  . Cancer ([place])    vulvular  . Cervical disc disease    a. 01/2018 MRI Cervical spine: Cervical spondylosis with multilevel disc and facet degeneration greatest at C5-6 and C6-7.  Moderate to sev R C5-6, mod L C5-6, and mod bilat C6-7 foraminal stenosis with multilevel mild foraminal stenosis.  Mild C5-6 and mod C6-7 canal stenosis. C6-7 central cord impingement w/ cord flattening.  . Chronic combined systolic (congestive) and diastolic (congestive) heart failure ([place])    a. 12/2017 Echo: EF 30-35%, mid-apicalanteroseptal, ant, and apical AK. Gr1 DD. Mild conc LVH.  Marland Kitchen COPD (chronic obstructive pulmonary disease) (H[facility])    not on home oxygen  . Coronary artery disease    a. 12/2017 ACS/PCI: LAD 100p (2.25x26 Resolute Onyx DES), 30m (2.0x12 Resolute Onyx DES), 80d (2.0x15 Resolute Onyx DES), RCA 90p (non-dominant). EF 25-35%. Post-MI course complicated by CGS; b. 08/8180 NSTEMI/subacute thrombosis-->LAD 100 (PTCA + DES x 1); c. 01/2018 NSTEMI/PCI: LM min irregs, LAD 50-60p ISR/hazy (3.5x12 Resolute DES), 63m/d (underexpansion of prior stents-->PTCA). EF 45-50%.  . Diabetes mellitus without complication ([place])   . GERD (gastroesophageal reflux disease)   . HLD (hyperlipidemia)   . Ischemic cardiomyopathy    a. 12/2017 Echo: EF 30-35%.  . Myocardial infarction  ([place])    a. 12/2017-->DES to LAD x 3.  . Pancreatitis   . Shingles   . Tobacco abuse    Past Surgical History:  Procedure Laterality Date  . ABDOMINAL HYSTERECTOMY     partial  . CORONARY STENT INTERVENTION N/A 12/26/2017   Procedure: CORONARY STENT INTERVENTION;  Surgeon: Wellington Hampshire, MD;  Location: [place];  Service: Cardiovascular;  Laterality: N/A;  . CORONARY STENT INTERVENTION N/A 02/08/2018   Procedure: CORONARY STENT INTERVENTION;  Surgeon: Nelva Bush, MD;  Location: [facility];  Service: Cardiovascular;  Laterality: N/A;  . CORONARY/GRAFT ACUTE MI REVASCULARIZATION N/A 12/19/2017   Procedure: Coronary/Graft Acute MI Revascularization;  Surgeon: Wellington Hampshire, MD;  Location: [facility];  Service: Cardiovascular;  Laterality: N/A;  . ECTOPIC PREGNANCY SURGERY Left   . INTRAVASCULAR ULTRASOUND/IVUS N/A 02/08/2018   Procedure: Intravascular Ultrasound/IVUS;  Surgeon: Nelva Bush, MD;  Location: [facility];  Service: Cardiovascular;  Laterality: N/A;  . LEFT HEART CATH AND CORONARY ANGIOGRAPHY N/A 12/19/2017   Procedure: LEFT HEART CATH AND CORONARY ANGIOGRAPHY;  Surgeon: Wellington Hampshire, MD;  Location: [facility];  Service: Cardiovascular;  Laterality: N/A;  . LEFT HEART CATH AND CORONARY ANGIOGRAPHY N/A 12/26/2017   Procedure: LEFT HEART CATH AND CORONARY ANGIOGRAPHY;  Surgeon: Wellington Hampshire, MD;  Location: [facility];  Service: Cardiovascular;  Laterality: N/A;  . LEFT HEART CATH AND CORONARY ANGIOGRAPHY N/A 02/08/2018   Procedure: LEFT HEART CATH AND CORONARY ANGIOGRAPHY;  Surgeon: Nelva Bush, MD;  Location: [facility];  Service: Cardiovascular;  Laterality: N/A;  . LYMPHADENECTOMY    . SPLENECTOMY, PARTIAL    . vulvulasectomy      Allergies  Allergies  Allergen Reactions  . Bee Venom Itching, Shortness Of Breath and Swelling  . Metformin And Related Shortness Of Breath  .  Contrast Allergy Premed Pack [Prednisone & Diphenhydramine]     Rash and itching  . Darvon [Propoxyphene] Itching  . Gabapentin Swelling  . Nsaids Other (See Comments)    Ulcers   . Tramadol Hives    History of Present Illness    52 year old female with a history of CAD status post recent myocardial infarction complicated by recurrent stent thrombosis, combined CHF/ischemic cardiomyopathy, tobacco abuse, COPD, hyperlipidemia, GERD, type 2 diabetes mellitus, and pancreatitis.  She was admitted to [facility] in early July with a several day history of chest pain radiating to her neck and arm.  She was found to have elevated troponins and subsequently underwent catheterization revealing severe proximal, mid, and distal LAD disease, along with severe disease in a nondominant right coronary artery.  EF was 25 to 35%.  She underwent drug-eluting stent placement to the LAD x3.  Post catheterization course was comp gated by cardiogenic shock and hypotension requiring vasopressors.  Follow-up echocardiogram showed an EF of 30 to 35%.  She was discharged on July 3 with a LifeVest in place.  She return to the emergency department on July 8 with recurrent chest pain radiating to her neck, back, and down her arms.  Repeat catheterization revealed a total occlusion of the proximal to mid LAD with large thrombus burden.  The LAD was successfully treated with PTCA and also drug-eluting stent to the mid LAD.  She was readmitted to [facility] July 28 to the neck and shoulder pain.  Troponins were normal and medical therapy was recommended.  She was readmitted on August 8 with similar symptoms and again, troponins were normal.  MRI of the cervical spine showed cervical spondylosis with multilevel disc and facet degeneration greatest at C5-6 and C6-7.  She also had moderate to severe right C5-6, moderate left C5-6, and moderate bilateral C6-7 foraminal stenosis with multilevel mild foraminal stenosis.  Mild  C5-6 and moderate C6-7 canal stenosis was noted, along with C6-7 central cord impingement with cord flattening.  It was felt that neck and shoulder pain might be related to all of these findings.    She was seen in clinic on August 25, at which time she continued to have neck and shoulder pain but no chest pain or dyspnea.  Medical therapy for ischemic cardiomyopathy remains limited secondary to relative hypotension.  Unfortunately, she was readmitted August 19 with recurrent chest discomfort.  She ruled out.  Diagnostic catheterization was performed and revealed recurrent in-stent thrombus in the proximal portion of her LAD.  This was successfully treated with a drug-eluting stent.  Intravascular ultrasound was performed and found under expansion of mid and distal stents.  These were treated with PTCA.  Of note, EF had improved to 45 to 50% by ventriculography.  Remainder of hospital course was uncomplicated and she was subsequently discharged August 22.  Since then, she has not had any recurrent chest discomfort or dyspnea.  She has had an increase in GERD symptoms with burning for which she takes Nexium.  On 2 occasions, she woke up early in the morning feeling hot from head to toe and mildly diaphoretic.  She was not having any chest pain.  Symptoms lasted up to an hour and  resolve spontaneously.  Symptoms might have improved after she had eaten statin.  She did not check her blood sugar.  She has not had any PND, orthopnea, dizziness, syncope, edema, or early satiety.  Home Medications    Prior to Admission medications   Medication Sig Start Date End Date Taking? Authorizing Provider  acetaminophen (TYLENOL) 325 MG tablet Take 650 mg by mouth every 6 (six) hours as needed for mild pain.     [provider]  albuterol (PROAIR HFA) 108 (90 Base) MCG/ACT inhaler Inhale 1 puff into the lungs every 6 (six) hours as needed for wheezing or shortness of breath.     [provider]  aspirin  81 MG tablet Take 81 mg by mouth daily.    [provider]  atorvastatin (LIPITOR) 80 MG tablet Take 1 tablet (80 mg total) by mouth daily at 6 PM. 01/10/18 03/11/18  Theora Gianotti, NP  citalopram (CELEXA) 20 MG tablet Take 20 mg daily by mouth.    [provider]  digoxin (LANOXIN) 0.125 MG tablet Take 1 tablet (0.125 mg total) by mouth daily. 02/02/18   Theora Gianotti, NP  esomeprazole (NEXIUM) 40 MG capsule Take 40 mg by mouth daily at 12 noon.    [provider]  insulin aspart (NOVOLOG) 100 UNIT/ML injection Inject 10 Units into the skin 3 (three) times daily before meals. Take 10 units PLUS sliding scale    [provider]  insulin glargine (LANTUS) 100 UNIT/ML injection Inject 0.7 mLs (70 Units total) into the skin at bedtime. 02/09/18   Vaughan Basta, MD  LORazepam (ATIVAN) 1 MG tablet Take 1 tablet (1 mg total) by mouth every 8 (eight) hours. 02/02/18   Theora Gianotti, NP  metoprolol succinate (TOPROL-XL) 25 MG 24 hr tablet Take 1 tablet (25 mg total) by mouth daily. 02/09/18   Vaughan Basta, MD  ondansetron (ZOFRAN) 4 MG tablet Take 1 tablet (4 mg total) by mouth daily as needed. 01/05/18   Schaevitz, Randall An, MD  ranolazine (RANEXA) 500 MG 12 hr tablet Take 1 tablet (500 mg total) by mouth 2 (two) times daily. 01/28/18   Dustin Flock, MD  ticagrelor (BRILINTA) 90 MG TABS tablet Take 1 tablet (90 mg total) by mouth 2 (two) times daily. 01/10/18 03/11/18  Theora Gianotti, NP    Review of Systems    She continues to have mild neck and shoulder pain which she feels is different from angina.  She has not had any chest pain, dyspnea, palpitations, PND, orthopnea, dizziness, syncope, edema, or early satiety.  She has had some increase in GERD symptoms and is using Nexium.  She is awake and twice in the past week feeling very hot and mildly diaphoretic in the absence of other symptoms.  All other  systems reviewed and are otherwise negative except as noted above.  Physical Exam    VS:  BP 116/64 (BP Location: Left Arm, Patient Position: Sitting, Cuff Size: Normal)   Pulse 84   Ht 5\' 1"  (1.549 m)   Wt 138 lb 8 oz (62.8 kg)   BMI 26.17 kg/m  , BMI Body mass index is 26.17 kg/m. GEN: Well nourished, well developed, in no acute distress. HEENT: normal. Neck: Supple, no JVD, carotid bruits, or masses. Cardiac: RRR, no murmurs, rubs, or gallops. No clubbing, cyanosis, edema.  Radials/DP/PT 2+ and equal bilaterally.  Right wrist catheterization site without bleeding, bruit, or hematoma. Respiratory:  Respirations regular and unlabored, clear to  auscultation bilaterally. GI: Soft, nontender, nondistended, BS + x 4. MS: no deformity or atrophy. Skin: warm and dry, no rash. Neuro:  Strength and sensation are intact. Psych: Normal affect.  Accessory Clinical Findings    ECG personally reviewed by me today -regular sinus rhythm, 84, left axis deviation, left atrial enlargement, prior septal infarct with lateral and septal T wave inversion- no acute changes.  Assessment & Plan    1.  Coronary artery disease: Status post anterior STEMI with LAD drug-eluting stent placement x3 in July followed by recurrent non-STEMI in the setting of subacute stent thrombosis according PTCA and repeat stenting of the proximal mid LAD in early/mid July.  She was most recently hospitalized in August with recurrent unstable angina and ruled out.  Catheterization showed hazy stenosis in the proximal/mid LAD which was successfully treated with drug-eluting stent.  Intravascular ultrasound showed inadequate expansion of distal stents and these were treated with PTCA.  She is not had any recurrent anginal symptoms such as chest pain or dyspnea.  We were able to add low-dose beta-blocker prior to her most recent discharge and she is tolerating this.  She otherwise remains on aspirin, statin, Brilinta, and Ranexa.  She is  interested in cardiac rehabilitation and at this point just has not been able to stay out of the hospital long enough to begin.  2.  Ischemic cardiomyopathy/HFrEF: Euvolemic on exam.  She is now on low-dose beta-blocker and is tolerating this.  I do not think she would tolerate addition of an acei/arb/arni/spiro, as she has had some lightheadedness with standing.  Of note, EF was 45 to 50% by ventriculography at the time of last catheterization.  This is encouraging.  She remains on digoxin.  She has a LifeVest in place and we will plan on repeat echocardiogram in mid October.  We discussed the importance of daily weights, sodium restriction, medication compliance, and symptom reporting and she verbalizes understanding.   3.  Relative hypotension: Blood pressure stable today 116/64 and beta-blocker therapy.  She continues to trend low at home.  4.  Tobacco abuse: She quit smoking in July.  She remains on nicotine patch.  I congratulated her on quitting and encouraged her to remain off of cigarettes.  5.  Type 2 diabetes mellitus: On Lantus and managed by internal medicine.  She has had 2 spells of waking up feeling very hot and diaphoretic.  She has not checked her blood sugar during these episodes and have asked her to do so as she may be bottoming out at night.  6.  Cervical disc disease: Recent MRI with significant degeneration also spinal stenosis.  She is followed in pain management clinic.  7.  Hyperlipidemia: LDL 60 in August with normal LFTs.  Continue statin therapy.  8.  Disposition: Follow-up in clinic in 1 month or sooner if necessary.  She will need to follow-up echo in mid October.  Murray Hodgkins, NP 02/21/2018, 10:32 AM

## 2018-02-27 ENCOUNTER — Ambulatory Visit: Payer: Medicaid Other | Admitting: Family Medicine

## 2018-02-27 ENCOUNTER — Encounter: Payer: Self-pay | Admitting: Family Medicine

## 2018-02-27 VITALS — BP 111/61 | HR 69 | Temp 97.8°F | Resp 16 | Ht 61.0 in | Wt 143.6 lb

## 2018-02-27 DIAGNOSIS — I25118 Atherosclerotic heart disease of native coronary artery with other forms of angina pectoris: Secondary | ICD-10-CM

## 2018-02-27 DIAGNOSIS — E785 Hyperlipidemia, unspecified: Secondary | ICD-10-CM

## 2018-02-27 DIAGNOSIS — J432 Centrilobular emphysema: Secondary | ICD-10-CM

## 2018-02-27 DIAGNOSIS — I5022 Chronic systolic (congestive) heart failure: Secondary | ICD-10-CM | POA: Diagnosis not present

## 2018-02-27 DIAGNOSIS — E1165 Type 2 diabetes mellitus with hyperglycemia: Secondary | ICD-10-CM | POA: Diagnosis not present

## 2018-02-27 DIAGNOSIS — Z23 Encounter for immunization: Secondary | ICD-10-CM

## 2018-02-27 DIAGNOSIS — Z87891 Personal history of nicotine dependence: Secondary | ICD-10-CM

## 2018-02-27 DIAGNOSIS — E1140 Type 2 diabetes mellitus with diabetic neuropathy, unspecified: Secondary | ICD-10-CM | POA: Diagnosis not present

## 2018-02-27 DIAGNOSIS — I252 Old myocardial infarction: Secondary | ICD-10-CM

## 2018-02-27 DIAGNOSIS — F418 Other specified anxiety disorders: Secondary | ICD-10-CM | POA: Insufficient documentation

## 2018-02-27 DIAGNOSIS — I2510 Atherosclerotic heart disease of native coronary artery without angina pectoris: Secondary | ICD-10-CM | POA: Insufficient documentation

## 2018-02-27 DIAGNOSIS — E1169 Type 2 diabetes mellitus with other specified complication: Secondary | ICD-10-CM

## 2018-02-27 DIAGNOSIS — IMO0002 Reserved for concepts with insufficient information to code with codable children: Secondary | ICD-10-CM

## 2018-02-27 LAB — POCT UA - MICROALBUMIN: Microalbumin Ur, POC: 20 mg/L

## 2018-02-27 MED ORDER — LORAZEPAM 1 MG PO TABS: 1.0000 mg | ORAL_TABLET | Freq: Two times a day (BID) | ORAL | 0 refills | Status: DC | PRN

## 2018-02-27 MED ORDER — INSULIN GLARGINE 100 UNIT/ML ~~LOC~~ SOLN: 70.0000 [IU] | Freq: Every day | SUBCUTANEOUS | 3 refills | Status: DC

## 2018-02-27 MED ORDER — CITALOPRAM HYDROBROMIDE 20 MG PO TABS: 20.0000 mg | ORAL_TABLET | Freq: Every day | ORAL | 1 refills | Status: DC

## 2018-02-27 NOTE — Patient Instructions (Addendum)
Thank you for coming to the office today.  Flu Shot today  Urine test today for protein  Once you change name on ins card. Call us and request referral to Diabetes specialist.  [facility] [place], [place]  [address] Phone: [phone]  Refilled Lantus for now - may need new medicine from Diabetic specialist.  Refilled the Ativan, for 1 month Refilled Celexa longer term.  Recommend that you re-schedule with Dr Miles Costain at [facility]  May use Aveeno, Lubriderm or Eucerin lotion for feet.  Your provider would like to you have your annual eye exam. Please contact your current eye doctor or here are some good options for you to contact.   [facility]   Address: [address] [place], [place] [address] Phone: [phone]  Website: [email or web address] [address] [place], [place] [address] Phone: [phone] [email or web address]  [facility]  Address: [place], [place], [place] [address] Phone: [phone]   [facility] [address] [place], [place] [address] Phone: [phone]  [facility] Address: [place], [place],  [address]  Phone: [phone]   Please schedule a Follow-up Appointment to: Return in about 3 months (around 05/29/2018) for 3 month follow-up specialist review (Cards/Endo/Psych) - med adjust.  If you have any other questions or concerns, please feel free to call the office or send a message through [facility]. You may also schedule an earlier appointment if necessary.  Additionally, you may be receiving a survey about your experience at our office within a few days to 1 week by e-mail or mail. We value your feedback.  Nobie Putnam, DO [place]

## 2018-02-27 NOTE — Assessment & Plan Note (Signed)
Chronic GAD with mixed features of depression, some related to life stressors and recent passed family members as well as personal health decline - Affecting her regular function more now  Plan: 1. Continue current therapy Celexa 20mg  daily, agree to refill Ativan 1mg  2-3 times PRN - she will return to prior [facility] Dr Miles Costain for further management, and she understands will only provide bridge on BDZ they may consider longer term treatment at Psych

## 2018-02-27 NOTE — Assessment & Plan Note (Signed)
Secondary to ischemic cardiomyopathy multiple MI - Followed by [facility] - On med management currently - Determined by cards that she would not tolerate ACEi/ARB/Spiro at this time - Follow repeat ECHO monitoring Return precautions

## 2018-02-27 NOTE — Assessment & Plan Note (Signed)
Uncontrolled DM with hyperglycemia, A1c previously >72 Complications - peripheral neuropathy, other including hyperlipidemia, GERD, depression, vascular disease with CAD - increases risk of future cardiovascular complications   Plan:  1. Continue current therapy - Lantus 70u nightly and Novolog TID SSI - no changes today as new patient, ultimately she is interested in 2nd opinion from Endocrinology locally - will refer to Jefm Bryant once she has changed her insurance medicaid card 2. Encourage improved lifestyle - low carb, low sugar diet, reduce portion size, continue improving regular exercise 3. Check CBG, bring log to next visit for review 4. Continue ASA, Statin - cannot tolerate ACEi/ARB due to BP/CHF 5. DM Foot exam done today / Advised to schedule DM ophtho exam, send record handout given 6. Follow-up 3 months

## 2018-02-27 NOTE — Assessment & Plan Note (Signed)
Stable chronic CAD currently without anginal symptoms on med management S/p multiple DES stent placement Followed by [facility] Cardiology On DAPT (Brilinta, ASA), statin, low dose BB

## 2018-02-27 NOTE — Assessment & Plan Note (Signed)
Stable chronic problem Secondary to tobacco abuse, smoking - now former smoker On albuterol PRN 

## 2018-02-27 NOTE — Assessment & Plan Note (Signed)
Remain smoke free, cessation

## 2018-02-27 NOTE — Assessment & Plan Note (Signed)
See A&P CAD above

## 2018-02-27 NOTE — Progress Notes (Signed)
Subjective:    Patient ID: Renee Mack, female    DOB: 07-10-1965, 51 y.o.   MRN: [id number]  Renee Mack is a 52 y.o. female presenting on 02/27/2018 for Establish Care (BS high 420 today); Diabetes; and Anxiety  Here to establish care with new PCP. She is transferring care from PCP at [facility] to receive her care closer to home locally, has transitioned some of her care already to [facility].  HPI   CAD with angina s/p MI (multiple), stents / Chronic Systolic CHF from Ischemic Cardiomyopathy Followed by [facility] Cardiology now, has been managed at [facility] within past few months for STEMI and recurrent NSTEMI, s/p DES x 3 - Last visit 02/21/18, has had her anginal symptoms controlled with stents and current med regimen. With Brilinta, Aspirin, Statin, Ranexa, future may consider Cardiac Rehab - Currently also has portable defibrillator - Given CHF, she is tolerating low dose beta blocker, but was determined by chart review to be less likely to tolerate ACEi/ARB/Spiro or other med. She remains on digoxin. She has lifevest.  CHRONIC DM, Type 2 / history of DKA Reports history of DM dx for >10 years, reports prior history of DKA due to high sugars. CBGs: Avg 200-, Low >100, High < 500. Checks CBGs Meds: Lantus 70 units PM, Novolog TID SSI Reports good compliance. Tolerating well w/o side-effects Currently not on ACEi / ARB - due for urine microalbumin Lifestyle: - Diet (improving DM diet)  - Exercise (gradual improving activity, limited by cardiac history) Admits history of UTI recurrent Admits some blurry vision - due for eye doctor Denies hypoglycemia, polyuria, numbness or tingling.  Anxiety - Previously went to [place], had seen Dr Miles Costain, she has been unable to return due to other health issues, she plans to resume care. - Husband passed away in March 14, 2017, also had loss of parents in past, and sister passed MVC - She has been on anxiety medication for past few years with good results, taking Ativan 1mg   2-3 times most days, not skipping days. No known history of withdrawal. Additionally taking SSRI with Celexa 20mg  daily with good results - needs refills today. She understands BDZ is not long-term from our office.  Follow-up [place] Cancer Followed by [facility]-On[facility] Dr [name], last seen 03/2017, chart review shows initial dx 2005, had left radical vulvectomy and groin node dissection, then years later 2013, R vulectomy, and other complications on biopsy. - Today patient reports she was requested to have further surgery but it was deferred until further treatment of A1c and Heart, given uncontrolled DM and CAD including MI  Chronic Pain Syndrome / DJD Spinal Disease Followed by Dr Meriel Pica, [facility] Pain Management Last seen 03/14/2018, has been given rx Voltaren gel, and home stretching regimen, also to see pain psychologist  COPD Recently controlled without flare. Has Albuterol PRN.  Health Maintenance: Review chart, will discuss at future visit for routine Cancer screening for breast, colon, cervical - she states had hysterectomy suggesting partial as she has had routine pap smears since, last documented 2013 was negative, prior mammograms back to 2013, last documented colonoscopy 2007.  Depression screen PHQ 2/9 02/27/2018  Decreased Interest 1  Down, Depressed, Hopeless 1  PHQ - 2 Score 2  Altered sleeping 3  Tired, decreased energy 3  Change in appetite 1  Feeling bad or failure about yourself  0  Moving slowly or fidgety/restless 1  Suicidal thoughts 1  PHQ-9 Score 11  Difficult doing work/chores Somewhat difficult  [place]-Suicide Severity Rating Scale 1) Have you wished you were dead or wished you could go to sleep and not wake up? - Yes  2) Have you had any actual thoughts of killing yourself? - No  Skip questions 3,4, 5  6) Have you ever done anything, started to do anything, or prepared to do anything to end your life? - No   GAD 7 : Generalized Anxiety Score 02/27/2018    Nervous, Anxious, on Edge 2  Control/stop worrying 2  Worry too much - different things 3  Trouble relaxing 3  Restless 2  Easily annoyed or irritable 2  Afraid - awful might happen 3  Total GAD 7 Score 17  Anxiety Difficulty Somewhat difficult    Past Medical History:  Diagnosis Date  . Cancer ([place])    vulvular  . Cervical disc disease    a. 01/2018 MRI Cervical spine: Cervical spondylosis with multilevel disc and facet degeneration greatest at C5-6 and C6-7.  Moderate to sev R C5-6, mod L C5-6, and mod bilat C6-7 foraminal stenosis with multilevel mild foraminal stenosis.  Mild C5-6 and mod C6-7 canal stenosis. C6-7 central cord impingement w/ cord flattening.  . Chronic combined systolic (congestive) and diastolic (congestive) heart failure ([place])    a. 12/2017 Echo: EF 30-35%, mid-apicalanteroseptal, ant, and apical AK. Gr1 DD. Mild conc LVH.  Marland Kitchen COPD (chronic obstructive pulmonary disease) (HCC)    not on home oxygen  . Coronary artery disease    a. 12/2017 ACS/PCI: LAD 100p (2.25x26 Resolute Onyx DES), 87m (2.0x12 Resolute Onyx DES), 80d (2.0x15 Resolute Onyx DES), RCA 90p (non-dominant). EF 25-35%. Post-MI course complicated by CGS; b. 08/90 NSTEMI/subacute thrombosis-->LAD 100 (PTCA + DES x 1); c. 01/2018 NSTEMI/PCI: LM min irregs, LAD 50-60p ISR/hazy (3.5x12 Resolute DES), 70m/d (underexpansion of prior stents-->PTCA). EF 45-50%.  Marland Kitchen GERD (gastroesophageal reflux disease)   . H/O degenerative disc disease   . Ischemic cardiomyopathy    a. 12/2017 Echo: EF 30-35%.  . Myocardial infarction ([place])    a. 12/2017-->DES to LAD x 3.  . Pancreatitis   . Shingles   . Spinal stenosis   . Tobacco abuse   . Ulcer (traumatic) of oral mucosa   . Urinary incontinence    Past Surgical History:  Procedure Laterality Date  . ABDOMINAL HYSTERECTOMY     partial  . CORONARY STENT INTERVENTION N/A 12/26/2017   Procedure: CORONARY STENT INTERVENTION;  Surgeon: Wellington Hampshire, MD;  Location: [place] [facility];  Service: Cardiovascular;  Laterality: N/A;  . CORONARY STENT INTERVENTION N/A 02/08/2018   Procedure: CORONARY STENT INTERVENTION;  Surgeon: Nelva Bush, MD;  Location: [place] [facility];  Service: Cardiovascular;  Laterality: N/A;  . CORONARY/GRAFT ACUTE MI REVASCULARIZATION N/A 12/19/2017   Procedure: Coronary/Graft Acute MI Revascularization;  Surgeon: Wellington Hampshire, MD;  Location: [place] [facility];  Service: Cardiovascular;  Laterality: N/A;  . ECTOPIC PREGNANCY SURGERY Left   . INTRAVASCULAR ULTRASOUND/IVUS N/A 02/08/2018   Procedure: Intravascular Ultrasound/IVUS;  Surgeon: Nelva Bush, MD;  Location: [place] [facility];  Service: Cardiovascular;  Laterality: N/A;  . LEFT HEART CATH AND CORONARY ANGIOGRAPHY N/A 12/19/2017   Procedure: LEFT HEART CATH AND CORONARY ANGIOGRAPHY;  Surgeon: Wellington Hampshire, MD;  Location: [place] [facility];  Service: Cardiovascular;  Laterality: N/A;  . LEFT HEART CATH AND CORONARY ANGIOGRAPHY N/A 12/26/2017   Procedure: LEFT HEART CATH AND CORONARY ANGIOGRAPHY;  Surgeon: Wellington Hampshire, MD;  Location: [place] [facility];  Service: Cardiovascular;  Laterality: N/A;  . LEFT HEART CATH AND CORONARY ANGIOGRAPHY N/A 02/08/2018   Procedure: LEFT HEART CATH AND CORONARY ANGIOGRAPHY;  Surgeon: Nelva Bush, MD;  Location: [place] [facility];  Service: Cardiovascular;  Laterality: N/A;  . LYMPHADENECTOMY    . SPLENECTOMY, PARTIAL    . vulvulasectomy     Social History   Socioeconomic History  . Marital status: Widowed    Spouse name: Not on file  . Number of children: Not on file  . Years of education: Not on file  . Highest education level: Not on file  Occupational History  . Not on file  Social Needs  . Financial resource strain: Not very hard  . Food insecurity:    Worry: Never true    Inability: Never true  . Transportation needs:    Medical: No    Non-medical: No  Tobacco Use  . Smoking status:  Former Smoker    Packs/day: 0.50    Years: 30.00    Pack years: 15.00    Types: Cigarettes  . Smokeless tobacco: Never Used  . Tobacco comment: quit 12/19/2017.  Substance and Sexual Activity  . Alcohol use: No  . Drug use: No  . Sexual activity: Not on file  Lifestyle  . Physical activity:    Days per week: 7 days    Minutes per session: 30 min  . Stress: Very much  Relationships  . Social connections:    Talks on phone: More than three times a week    Gets together: Patient refused    Attends religious service: Never    Active member of club or organization: No    Attends meetings of clubs or organizations: Never    Relationship status: Widowed  . Intimate partner violence:    Fear of current or ex partner: No    Emotionally abused: No    Physically abused: No    Forced sexual activity: No  Other Topics Concern  . Not on file  Social History Narrative   Lives in [place] by herself.  Does not routinely exercise.   Family History  Problem Relation Age of Onset  . Cancer Mother   . Osteoporosis Sister   . Heart disease Brother   . Heart attack Father    Current Outpatient Medications on File Prior to Visit  Medication Sig  . acetaminophen (TYLENOL) 325 MG tablet Take 650 mg by mouth every 6 (six) hours as needed for mild pain.   Marland Kitchen albuterol (PROAIR HFA) 108 (90 Base) MCG/ACT inhaler Inhale 1 puff into the lungs every 6 (six) hours as needed for wheezing or shortness of breath.   Marland Kitchen aspirin 81 MG tablet Take 81 mg by mouth daily.  Marland Kitchen atorvastatin (LIPITOR) 80 MG tablet Take 1 tablet (80 mg total) by mouth daily at 6 PM.  . digoxin (LANOXIN) 0.125 MG tablet Take 1 tablet (0.125 mg total) by mouth daily.  Marland Kitchen esomeprazole (NEXIUM) 40 MG capsule Take 40 mg by mouth daily at 12 noon.  . insulin aspart (NOVOLOG) 100 UNIT/ML injection Inject 10 Units into the skin 3 (three) times daily before meals. Take 10 units PLUS sliding scale  . metoprolol succinate (TOPROL-XL) 25 MG 24  hr tablet Take 1 tablet (25 mg total) by mouth daily.  . ondansetron (ZOFRAN) 4 MG tablet Take 1 tablet (4 mg total) by mouth daily as needed.  . ranolazine (RANEXA) 500 MG 12 hr tablet Take 1 tablet (500 mg total) by mouth  2 (two) times daily.  . ticagrelor (BRILINTA) 90 MG TABS tablet Take 1 tablet (90 mg total) by mouth 2 (two) times daily.   No current facility-administered medications on file prior to visit.     Review of Systems Per HPI unless specifically indicated above     Objective:    BP 111/61   Pulse 69   Temp 97.8 F (36.6 C) (Oral)   Resp 16   Ht 5\' 1"  (1.549 m)   Wt 143 lb 9.6 oz (65.1 kg)   BMI 27.13 kg/m   Wt Readings from Last 3 Encounters:  02/27/18 143 lb 9.6 oz (65.1 kg)  02/21/18 138 lb 8 oz (62.8 kg)  02/09/18 139 lb (63 kg)    Physical Exam  Constitutional: She is oriented to person, place, and time. She appears well-developed and well-nourished. No distress.  Well-appearing, comfortable, cooperative  HENT:  Head: Normocephalic and atraumatic.  Mouth/Throat: Oropharynx is clear and moist.  Eyes: Conjunctivae are normal. Right eye exhibits no discharge. Left eye exhibits no discharge.  Cardiovascular: Normal rate, regular rhythm, normal heart sounds and intact distal pulses.  No murmur heard. Pulmonary/Chest: Effort normal.  Musculoskeletal: She exhibits no edema.  Neurological: She is alert and oriented to person, place, and time.  Skin: Skin is warm and dry. No rash noted. She is not diaphoretic. No erythema.  Psychiatric: She has a normal mood and affect. Her behavior is normal.  Well groomed, good eye contact, normal speech and thoughts  Nursing note and vitals reviewed.    Diabetic Foot Exam - Simple   Simple Foot Form Diabetic Foot exam was performed with the following findings:  Yes 02/27/2018  2:46 PM  Visual Inspection See comments:  Yes Sensation Testing Intact to touch and monofilament testing bilaterally:  Yes Pulse  Check Posterior Tibialis and Dorsalis pulse intact bilaterally:  Yes Comments Plantar aspect with some dry skin and callus formation, mild peeling without ulceration.     Results for orders placed or performed in visit on 02/27/18  POCT UA - Microalbumin  Result Value Ref Range   Microalbumin Ur, POC 20 mg/L      Assessment & Plan:   Problem List Items Addressed This Visit    Anxiety associated with depression    Chronic GAD with mixed features of depression, some related to life stressors and recent passed family members as well as personal health decline - Affecting her regular function more now  Plan: 1. Continue current therapy Celexa 20mg  daily, agree to refill Ativan 1mg  2-3 times PRN - she will return to prior [facility] Psychiat[facility] Dr Miles Costain for further management, and she understands will only provide bridge on BDZ they may consider longer term treatment at Psych      Relevant Medications   citalopram (CELEXA) 20 MG tablet   LORazepam (ATIVAN) 1 MG tablet   insulin glargine (LANTUS) 100 UNIT/ML injection   CAD (coronary artery disease)    Stable chronic CAD currently without anginal symptoms on med management S/p multiple DES stent placement Followed by [facility] On DAPT (Brilinta, ASA), statin, low dose BB       Chronic systolic heart failure ([place])    Secondary to ischemic cardiomyopathy multiple MI - Followed by [place] [facility] - On med management currently - Determined by cards that she would not tolerate ACEi/ARB/Spiro at this time - Follow repeat ECHO monitoring Return precautions      COPD (chronic obstructive pulmonary disease) ([place]) (Chronic)    Stable  chronic problem Secondary to tobacco abuse, smoking - now former smoker On albuterol PRN=      Former smoker    Remain smoke free, cessation      History of ST elevation myocardial infarction (STEMI)    See A&P CAD above      Hyperlipidemia associated with type 2 diabetes mellitus (HCC)    Relevant Medications   insulin glargine (LANTUS) 100 UNIT/ML injection   Uncontrolled type 2 diabetes with neuropathy ([place]) - Primary    Uncontrolled DM with hyperglycemia, A1c previously >15 Complications - peripheral neuropathy, other including hyperlipidemia, GERD, depression, vascular disease with CAD - increases risk of future cardiovascular complications   Plan:  1. Continue current therapy - Lantus 70u nightly and Novolog TID SSI - no changes today as new patient, ultimately she is interested in 2nd opinion from Endocrinology locally - will refer to Jefm Bryant once she has changed her insurance medicaid card 2. Encourage improved lifestyle - low carb, low sugar diet, reduce portion size, continue improving regular exercise 3. Check CBG, bring log to next visit for review 4. Continue ASA, Statin - cannot tolerate ACEi/ARB due to BP/CHF 5. DM Foot exam done today / Advised to schedule DM ophtho exam, send record handout given 6. Follow-up 3 months       Relevant Medications   LORazepam (ATIVAN) 1 MG tablet   insulin glargine (LANTUS) 100 UNIT/ML injection   Other Relevant Orders   POCT UA - Microalbumin (Completed)    Other Visit Diagnoses    Needs flu shot       Relevant Orders   Flu Vaccine QUAD 36+ mos IM (Completed)      Meds ordered this encounter  Medications  . citalopram (CELEXA) 20 MG tablet    Sig: Take 1 tablet (20 mg total) by mouth daily.    Dispense:  90 tablet    Refill:  1  . LORazepam (ATIVAN) 1 MG tablet    Sig: Take 1 tablet (1 mg total) by mouth 2 (two) times daily as needed for anxiety.    Dispense:  60 tablet    Refill:  0  . insulin glargine (LANTUS) 100 UNIT/ML injection    Sig: Inject 0.7 mLs (70 Units total) into the skin at bedtime.    Dispense:  40 mL    Refill:  3    Follow up plan: Return in about 3 months (around 05/29/2018) for 3 month follow-up specialist review (Cards/Endo/Psych) - med adjust.  Nobie Putnam, DO [facility] 02/27/2018, 10:59 PM

## 2018-03-04 ENCOUNTER — Other Ambulatory Visit: Payer: Self-pay

## 2018-03-04 ENCOUNTER — Emergency Department
Admission: EM | Admit: 2018-03-04 | Discharge: 2018-03-04 | Disposition: A | Discharge status: Home / Self Care | Payer: Medicaid Other | Attending: Emergency Medicine | Admitting: Emergency Medicine

## 2018-03-04 DIAGNOSIS — J449 Chronic obstructive pulmonary disease, unspecified: Secondary | ICD-10-CM | POA: Diagnosis not present

## 2018-03-04 DIAGNOSIS — Z794 Long term (current) use of insulin: Secondary | ICD-10-CM | POA: Diagnosis not present

## 2018-03-04 DIAGNOSIS — I2510 Atherosclerotic heart disease of native coronary artery without angina pectoris: Secondary | ICD-10-CM | POA: Insufficient documentation

## 2018-03-04 DIAGNOSIS — Z7982 Long term (current) use of aspirin: Secondary | ICD-10-CM | POA: Diagnosis not present

## 2018-03-04 DIAGNOSIS — E1140 Type 2 diabetes mellitus with diabetic neuropathy, unspecified: Secondary | ICD-10-CM | POA: Insufficient documentation

## 2018-03-04 DIAGNOSIS — Z79899 Other long term (current) drug therapy: Secondary | ICD-10-CM | POA: Diagnosis not present

## 2018-03-04 DIAGNOSIS — Z87891 Personal history of nicotine dependence: Secondary | ICD-10-CM | POA: Diagnosis not present

## 2018-03-04 DIAGNOSIS — L02211 Cutaneous abscess of abdominal wall: Secondary | ICD-10-CM | POA: Insufficient documentation

## 2018-03-04 DIAGNOSIS — I5022 Chronic systolic (congestive) heart failure: Secondary | ICD-10-CM | POA: Insufficient documentation

## 2018-03-04 DIAGNOSIS — R600 Localized edema: Secondary | ICD-10-CM | POA: Diagnosis present

## 2018-03-04 DIAGNOSIS — L0291 Cutaneous abscess, unspecified: Secondary | ICD-10-CM

## 2018-03-04 MED ORDER — OXYCODONE-ACETAMINOPHEN 5-325 MG PO TABS: 1.0000 | ORAL_TABLET | Freq: Three times a day (TID) | ORAL | 0 refills | Status: AC | PRN

## 2018-03-04 MED ORDER — SULFAMETHOXAZOLE-TRIMETHOPRIM 800-160 MG PO TABS: 1.0000 | ORAL_TABLET | Freq: Two times a day (BID) | ORAL | 0 refills | Status: AC

## 2018-03-04 MED ORDER — SULFAMETHOXAZOLE-TRIMETHOPRIM 800-160 MG PO TABS
1.0000 | ORAL_TABLET | Freq: Once | ORAL | Status: AC
  Administered 2018-03-04: 1 via ORAL
  Filled 2018-03-04: qty 1

## 2018-03-04 NOTE — ED Provider Notes (Signed)
[facility] Emergency Department Provider Note  ____________________________________________  Time seen: Approximately 4:57 PM  I have reviewed the triage vital signs and the nursing notes.   HISTORY  Chief Complaint Abscess    HPI Renee Mack is a 52 y.o. female with a history of diabetes, presents to the emergency department with a 1 cm x 1 cm suprapubic abscess that has been apparent for the past 1 to 2 days.  Patient reports that affected region has been spontaneously draining.  Patient has a history of vulvar carcinoma and yeast vaginitis and has erythema of the labia majora and minora at baseline.  Patient does have a history of cutaneous abscesses and has had to undergo incision and drainage in the past.  No fever or chills.  No other alleviating measures have been attempted.   Past Medical History:  Diagnosis Date  . Cancer ([place])    vulvular  . Cervical disc disease    a. 01/2018 MRI Cervical spine: Cervical spondylosis with multilevel disc and facet degeneration greatest at C5-6 and C6-7.  Moderate to sev R C5-6, mod L C5-6, and mod bilat C6-7 foraminal stenosis with multilevel mild foraminal stenosis.  Mild C5-6 and mod C6-7 canal stenosis. C6-7 central cord impingement w/ cord flattening.  . Chronic combined systolic (congestive) and diastolic (congestive) heart failure ([place])    a. 12/2017 Echo: EF 30-35%, mid-apicalanteroseptal, ant, and apical AK. Gr1 DD. Mild conc LVH.  Marland Kitchen COPD (chronic obstructive pulmonary disease) (HCC)    not on home oxygen  . Coronary artery disease    a. 12/2017 ACS/PCI: LAD 100p (2.25x26 Resolute Onyx DES), 77m (2.0x12 Resolute Onyx DES), 80d (2.0x15 Resolute Onyx DES), RCA 90p (non-dominant). EF 25-35%. Post-MI course complicated by CGS; b. 02/3234 NSTEMI/subacute thrombosis-->LAD 100 (PTCA + DES x 1); c. 01/2018 NSTEMI/PCI: LM min irregs, LAD 50-60p ISR/hazy (3.5x12 Resolute DES), 49m/d (underexpansion of prior stents-->PTCA).  EF 45-50%.  Marland Kitchen GERD (gastroesophageal reflux disease)   . H/O degenerative disc disease   . Ischemic cardiomyopathy    a. 12/2017 Echo: EF 30-35%.  . Myocardial infarction ([place])    a. 12/2017-->DES to LAD x 3.  . Pancreatitis   . Shingles   . Spinal stenosis   . Tobacco abuse   . Ulcer (traumatic) of oral mucosa   . Urinary incontinence     Patient Active Problem List   Diagnosis Date Noted  . History of ST elevation myocardial infarction (STEMI) 02/27/2018  . CAD (coronary artery disease) 02/27/2018  . Hyperlipidemia associated with type 2 diabetes mellitus ([place]) 02/27/2018  . Anxiety associated with depression 02/27/2018  . Chronic systolic heart failure ([place])   . Chest pain 12/26/2017  . Ischemic cardiomyopathy   . Former smoker   . Protein-calorie malnutrition, severe 07/18/2017  . Uncontrolled type 2 diabetes with neuropathy ([place]) 12/28/2014  . COPD (chronic obstructive pulmonary disease) ([place]) 12/28/2014  . GERD (gastroesophageal reflux disease) 12/28/2014  . History of shingles 12/28/2014  . Hyperlipidemia LDL goal <70 12/28/2014    Past Surgical History:  Procedure Laterality Date  . ABDOMINAL HYSTERECTOMY     partial  . CORONARY STENT INTERVENTION N/A 12/26/2017   Procedure: CORONARY STENT INTERVENTION;  Surgeon: Wellington Hampshire, MD;  Location: [facility];  Service: Cardiovascular;  Laterality: N/A;  . CORONARY STENT INTERVENTION N/A 02/08/2018   Procedure: CORONARY STENT INTERVENTION;  Surgeon: Nelva Bush, MD;  Location: [facility];  Service: Cardiovascular;  Laterality: N/A;  . CORONARY/GRAFT ACUTE  MI REVASCULARIZATION N/A 12/19/2017   Procedure: Coronary/Graft Acute MI Revascularization;  Surgeon: Wellington Hampshire, MD;  Location: [place];  Service: Cardiovascular;  Laterality: N/A;  . ECTOPIC PREGNANCY SURGERY Left   . INTRAVASCULAR ULTRASOUND/IVUS N/A 02/08/2018   Procedure: Intravascular Ultrasound/IVUS;  Surgeon: Nelva Bush, MD;  Location: [facility] [facility];  Service: Cardiovascular;  Laterality: N/A;  . LEFT HEART CATH AND CORONARY ANGIOGRAPHY N/A 12/19/2017   Procedure: LEFT HEART CATH AND CORONARY ANGIOGRAPHY;  Surgeon: Wellington Hampshire, MD;  Location: [facility];  Service: Cardiovascular;  Laterality: N/A;  . LEFT HEART CATH AND CORONARY ANGIOGRAPHY N/A 12/26/2017   Procedure: LEFT HEART CATH AND CORONARY ANGIOGRAPHY;  Surgeon: Wellington Hampshire, MD;  Location: [place] [facility];  Service: Cardiovascular;  Laterality: N/A;  . LEFT HEART CATH AND CORONARY ANGIOGRAPHY N/A 02/08/2018   Procedure: LEFT HEART CATH AND CORONARY ANGIOGRAPHY;  Surgeon: Nelva Bush, MD;  Location: [place] [facility];  Service: Cardiovascular;  Laterality: N/A;  . LYMPHADENECTOMY    . SPLENECTOMY, PARTIAL    . vulvulasectomy      Prior to Admission medications   Medication Sig Start Date End Date Taking? Authorizing Provider  acetaminophen (TYLENOL) 325 MG tablet Take 650 mg by mouth every 6 (six) hours as needed for mild pain.     [provider]  albuterol (PROAIR HFA) 108 (90 Base) MCG/ACT inhaler Inhale 1 puff into the lungs every 6 (six) hours as needed for wheezing or shortness of breath.     [provider]  aspirin 81 MG tablet Take 81 mg by mouth daily.    [provider]  atorvastatin (LIPITOR) 80 MG tablet Take 1 tablet (80 mg total) by mouth daily at 6 PM. 01/10/18 03/11/18  Theora Gianotti, NP  citalopram (CELEXA) 20 MG tablet Take 1 tablet (20 mg total) by mouth daily. 02/27/18   Karamalegos, Devonne Doughty, DO  digoxin (LANOXIN) 0.125 MG tablet Take 1 tablet (0.125 mg total) by mouth daily. 02/02/18   Theora Gianotti, NP  esomeprazole (NEXIUM) 40 MG capsule Take 40 mg by mouth daily at 12 noon.    [provider]  insulin aspart (NOVOLOG) 100 UNIT/ML injection Inject 10 Units into the skin 3 (three) times daily before meals. Take 10 units PLUS  sliding scale    [provider]  insulin glargine (LANTUS) 100 UNIT/ML injection Inject 0.7 mLs (70 Units total) into the skin at bedtime. 02/27/18   Karamalegos, Devonne Doughty, DO  LORazepam (ATIVAN) 1 MG tablet Take 1 tablet (1 mg total) by mouth 2 (two) times daily as needed for anxiety. 02/27/18   Karamalegos, Devonne Doughty, DO  metoprolol succinate (TOPROL-XL) 25 MG 24 hr tablet Take 1 tablet (25 mg total) by mouth daily. 02/21/18   Theora Gianotti, NP  ondansetron (ZOFRAN) 4 MG tablet Take 1 tablet (4 mg total) by mouth daily as needed. 01/05/18   Schaevitz, Randall An, MD  oxyCODONE-acetaminophen (PERCOCET/ROXICET) 5-325 MG tablet Take 1 tablet by mouth every 8 (eight) hours as needed for up to 2 days for severe pain. 03/04/18 03/06/18  Lannie Fields, PA-C  ranolazine (RANEXA) 500 MG 12 hr tablet Take 1 tablet (500 mg total) by mouth 2 (two) times daily. 01/28/18   Dustin Flock, MD  sulfamethoxazole-trimethoprim (BACTRIM DS,SEPTRA DS) 800-160 MG tablet Take 1 tablet by mouth 2 (two) times daily for 7 days. 03/04/18 03/11/18  Lannie Fields, PA-C  ticagrelor Kary Kos)  90 MG TABS tablet Take 1 tablet (90 mg total) by mouth 2 (two) times daily. 01/10/18 03/11/18  Theora Gianotti, NP    Allergies Bee venom; Metformin and related; Contrast allergy premed pack [prednisone & diphenhydramine]; Darvon [propoxyphene]; Gabapentin; Nsaids; and Tramadol  Family History  Problem Relation Age of Onset  . Cancer Mother   . Osteoporosis Sister   . Heart disease Brother   . Heart attack Father     Social History Social History   Tobacco Use  . Smoking status: Former Smoker    Packs/day: 0.50    Years: 30.00    Pack years: 15.00    Types: Cigarettes  . Smokeless tobacco: Never Used  . Tobacco comment: quit 12/19/2017.  Substance Use Topics  . Alcohol use: No  . Drug use: No     Review of Systems  Constitutional: No fever/chills Eyes: No visual changes. No  discharge ENT: No upper respiratory complaints. Cardiovascular: no chest pain. Respiratory: no cough. No SOB. Gastrointestinal: No abdominal pain.  No nausea, no vomiting.  No diarrhea.  No constipation. Musculoskeletal: Negative for musculoskeletal pain. Skin: Patient has suprapubic abscess. Neurological: Negative for headaches, focal weakness or numbness.   ____________________________________________   PHYSICAL EXAM:  VITAL SIGNS: ED Triage Vitals  Enc Vitals Group     BP 03/04/18 1609 122/74     Pulse Rate 03/04/18 1609 (!) 103     Resp 03/04/18 1609 16     Temp 03/04/18 1609 98.2 F (36.8 C)     Temp Source 03/04/18 1609 Oral     SpO2 03/04/18 1609 97 %     Weight 03/04/18 1608 135 lb (61.2 kg)     Height 03/04/18 1608 5\' 1"  (1.549 m)     Head Circumference --      Peak Flow --      Pain Score 03/04/18 1608 9     Pain Loc --      Pain Edu? --      Excl. in [place]? --      Constitutional: Alert and oriented. Well appearing and in no acute distress. Eyes: Conjunctivae are normal. PERRL. EOMI. Head: Atraumatic. Cardiovascular: Normal rate, regular rhythm. Normal S1 and S2.  Good peripheral circulation. Respiratory: Normal respiratory effort without tachypnea or retractions. Lungs CTAB. Good air entry to the bases with no decreased or absent breath sounds. Gastrointestinal: Bowel sounds 4 quadrants. Soft and nontender to palpation. No guarding or rigidity. No palpable masses. No distention. No CVA tenderness. Genitourinary: Labia majora and minora are beefy and red with satellite lesions.  No palpable induration or fluctuance of the external vagina Musculoskeletal: Full range of motion to all extremities. No gross deformities appreciated. Neurologic:  Normal speech and language. No gross focal neurologic deficits are appreciated.  Skin: Patient has a 1 cm x 1 cm suprapubic abscess that is spontaneously draining.  No induration or palpable fluctuance.  Mild surrounding  cellulitis. Psychiatric: Mood and affect are normal. Speech and behavior are normal. Patient exhibits appropriate insight and judgement.   ____________________________________________   LABS (all labs ordered are listed, but only abnormal results are displayed)  Labs Reviewed - No data to display ____________________________________________  EKG   ____________________________________________  RADIOLOGY   No results found.  ____________________________________________    PROCEDURES  Procedure(s) performed:    Procedures    Medications  sulfamethoxazole-trimethoprim (BACTRIM DS,SEPTRA DS) 800-160 MG per tablet 1 tablet (1 tablet Oral Given 03/04/18 1647)     ____________________________________________  INITIAL IMPRESSION / ASSESSMENT AND PLAN / ED COURSE  Pertinent labs & imaging results that were available during my care of the patient were reviewed by me and considered in my medical decision making (see chart for details).  Review of the [place] CSRS was performed in accordance of the [place] prior to dispensing any controlled drugs.      Assessment and plan Suprapubic abscess Patient presents to the emergency department with a 1 cm x 1 cm suprapubic abscess that was spontaneously draining and not conducive to incision and drainage at this time.  I have evaluated this patient in the past and appearance of the labia majora and minora are consistent with baseline.  Patient assured me that she is being treated for yeast vaginitis and has been adhering to her medication.  Patient did request a short prescription for Roxicet.  The [place] controlled substance database was consulted.  Patient did have a 7-day prescription for Roxicet on 02/09/2018 but has not filled any other narcotic pain medicines since that time.  Patient was told that she would be given a 2-day prescription for Roxicet and that if she return for subsequent pain medication, request would be denied.   Patient voiced understanding.  All patient questions were answered.   ____________________________________________  FINAL CLINICAL IMPRESSION(S) / ED DIAGNOSES  Final diagnoses:  Abscess      NEW MEDICATIONS STARTED DURING THIS VISIT:  ED Discharge Orders         Ordered    oxyCODONE-acetaminophen (PERCOCET/ROXICET) 5-325 MG tablet  Every 8 hours PRN     03/04/18 1648    sulfamethoxazole-trimethoprim (BACTRIM DS,SEPTRA DS) 800-160 MG tablet  2 times daily     03/04/18 1648              This chart was dictated using voice recognition software/Dragon. Despite best efforts to proofread, errors can occur which can change the meaning. Any change was purely unintentional.    Lannie Fields, PA-C 03/04/18 1704    Nance Pear, MD 03/04/18 [phone]

## 2018-03-04 NOTE — ED Notes (Signed)
Pt states suprapubic abscess on left side with swelling and redness extending down into the labia, with puss present. Pt given blanket to cover with, and instructed to undress from wait down for provider to assess

## 2018-03-04 NOTE — ED Notes (Signed)
NAD noted at time of D/C. Pt denies questions or concerns. Pt ambulatory to the lobby at this time.  

## 2018-03-04 NOTE — ED Triage Notes (Signed)
Pt alert, oriented, ambulatory. States L labial abscess since yesterday. Some drainage.

## 2018-03-06 ENCOUNTER — Other Ambulatory Visit
Admission: RE | Admit: 2018-03-06 | Discharge: 2018-03-06 | Disposition: A | Discharge status: Home / Self Care | Payer: Medicaid Other | Source: Ambulatory Visit | Attending: Nurse Practitioner | Admitting: Nurse Practitioner

## 2018-03-06 DIAGNOSIS — E785 Hyperlipidemia, unspecified: Secondary | ICD-10-CM

## 2018-03-06 DIAGNOSIS — I5022 Chronic systolic (congestive) heart failure: Secondary | ICD-10-CM | POA: Diagnosis present

## 2018-03-06 LAB — LIPID PANEL
CHOL/HDL RATIO: 5 ratio
Cholesterol: 129 mg/dL (ref 0–200)
HDL: 26 mg/dL — AB (ref >40)
LDL CALC: 60 mg/dL (ref 0–99)
Triglycerides: 213 mg/dL — ABNORMAL HIGH (ref <150)
VLDL: 43 mg/dL — AB (ref 0–40)

## 2018-03-06 LAB — HEPATIC FUNCTION PANEL
ALT: 21 U/L (ref 0–44)
AST: 18 U/L (ref 15–41)
Albumin: 3.6 g/dL (ref 3.5–5.0)
Alkaline Phosphatase: 96 U/L (ref 38–126)
Bilirubin, Direct: <0.1 mg/dL (ref 0.0–0.2)
TOTAL PROTEIN: 7.6 g/dL (ref 6.5–8.1)
Total Bilirubin: 0.6 mg/dL (ref 0.3–1.2)

## 2018-03-07 ENCOUNTER — Telehealth: Payer: Self-pay | Admitting: Nurse Practitioner

## 2018-03-07 NOTE — Telephone Encounter (Signed)
I left a message for the patient to call back regarding her lipid/ liver panel from yesterday.

## 2018-03-07 NOTE — Telephone Encounter (Signed)
I spoke with the patient regarding his lab results.  She verbalizes understanding.

## 2018-03-08 ENCOUNTER — Ambulatory Visit: Payer: Medicaid Other | Admitting: Nurse Practitioner

## 2018-03-09 ENCOUNTER — Other Ambulatory Visit: Payer: Self-pay | Admitting: *Deleted

## 2018-03-09 MED ORDER — RANOLAZINE ER 500 MG PO TB12: 500.0000 mg | ORAL_TABLET | Freq: Two times a day (BID) | ORAL | 3 refills | Status: DC

## 2018-03-09 NOTE — Telephone Encounter (Signed)
Please advise if ok to refill Ranexa 500 mg tablet.

## 2018-03-09 NOTE — Telephone Encounter (Signed)
Started by hospitalist on 01/28/18. Patient has been seen since then in our office and med was continued.  Ok to refill.

## 2018-03-24 ENCOUNTER — Emergency Department: Payer: Medicaid Other

## 2018-03-24 ENCOUNTER — Other Ambulatory Visit: Payer: Self-pay

## 2018-03-24 ENCOUNTER — Observation Stay
Admission: EM | Admit: 2018-03-24 | Discharge: 2018-03-25 | Disposition: A | Discharge status: Home / Self Care | Payer: Medicaid Other | Attending: Internal Medicine | Admitting: Internal Medicine

## 2018-03-24 DIAGNOSIS — M4802 Spinal stenosis, cervical region: Secondary | ICD-10-CM | POA: Insufficient documentation

## 2018-03-24 DIAGNOSIS — E1140 Type 2 diabetes mellitus with diabetic neuropathy, unspecified: Secondary | ICD-10-CM | POA: Insufficient documentation

## 2018-03-24 DIAGNOSIS — I5042 Chronic combined systolic (congestive) and diastolic (congestive) heart failure: Secondary | ICD-10-CM | POA: Insufficient documentation

## 2018-03-24 DIAGNOSIS — Z794 Long term (current) use of insulin: Secondary | ICD-10-CM | POA: Insufficient documentation

## 2018-03-24 DIAGNOSIS — Z79899 Other long term (current) drug therapy: Secondary | ICD-10-CM | POA: Insufficient documentation

## 2018-03-24 DIAGNOSIS — Z955 Presence of coronary angioplasty implant and graft: Secondary | ICD-10-CM | POA: Insufficient documentation

## 2018-03-24 DIAGNOSIS — Z888 Allergy status to other drugs, medicaments and biological substances status: Secondary | ICD-10-CM | POA: Diagnosis not present

## 2018-03-24 DIAGNOSIS — R0789 Other chest pain: Principal | ICD-10-CM

## 2018-03-24 DIAGNOSIS — E785 Hyperlipidemia, unspecified: Secondary | ICD-10-CM | POA: Insufficient documentation

## 2018-03-24 DIAGNOSIS — E1159 Type 2 diabetes mellitus with other circulatory complications: Secondary | ICD-10-CM

## 2018-03-24 DIAGNOSIS — K219 Gastro-esophageal reflux disease without esophagitis: Secondary | ICD-10-CM | POA: Insufficient documentation

## 2018-03-24 DIAGNOSIS — G894 Chronic pain syndrome: Secondary | ICD-10-CM

## 2018-03-24 DIAGNOSIS — Z95810 Presence of automatic (implantable) cardiac defibrillator: Secondary | ICD-10-CM | POA: Diagnosis not present

## 2018-03-24 DIAGNOSIS — I255 Ischemic cardiomyopathy: Secondary | ICD-10-CM | POA: Diagnosis not present

## 2018-03-24 DIAGNOSIS — Z91030 Bee allergy status: Secondary | ICD-10-CM | POA: Diagnosis not present

## 2018-03-24 DIAGNOSIS — Z7982 Long term (current) use of aspirin: Secondary | ICD-10-CM | POA: Insufficient documentation

## 2018-03-24 DIAGNOSIS — J449 Chronic obstructive pulmonary disease, unspecified: Secondary | ICD-10-CM | POA: Diagnosis not present

## 2018-03-24 DIAGNOSIS — Z886 Allergy status to analgesic agent status: Secondary | ICD-10-CM | POA: Insufficient documentation

## 2018-03-24 DIAGNOSIS — E782 Mixed hyperlipidemia: Secondary | ICD-10-CM

## 2018-03-24 DIAGNOSIS — I2510 Atherosclerotic heart disease of native coronary artery without angina pectoris: Secondary | ICD-10-CM | POA: Diagnosis not present

## 2018-03-24 DIAGNOSIS — I25118 Atherosclerotic heart disease of native coronary artery with other forms of angina pectoris: Secondary | ICD-10-CM

## 2018-03-24 DIAGNOSIS — F419 Anxiety disorder, unspecified: Secondary | ICD-10-CM | POA: Diagnosis not present

## 2018-03-24 DIAGNOSIS — Z87891 Personal history of nicotine dependence: Secondary | ICD-10-CM | POA: Insufficient documentation

## 2018-03-24 DIAGNOSIS — I252 Old myocardial infarction: Secondary | ICD-10-CM | POA: Diagnosis not present

## 2018-03-24 DIAGNOSIS — F17200 Nicotine dependence, unspecified, uncomplicated: Secondary | ICD-10-CM

## 2018-03-24 DIAGNOSIS — R079 Chest pain, unspecified: Secondary | ICD-10-CM | POA: Diagnosis present

## 2018-03-24 DIAGNOSIS — Z8719 Personal history of other diseases of the digestive system: Secondary | ICD-10-CM | POA: Insufficient documentation

## 2018-03-24 DIAGNOSIS — J432 Centrilobular emphysema: Secondary | ICD-10-CM

## 2018-03-24 DIAGNOSIS — E1165 Type 2 diabetes mellitus with hyperglycemia: Secondary | ICD-10-CM

## 2018-03-24 LAB — COMPREHENSIVE METABOLIC PANEL
ALBUMIN: 3.4 g/dL — AB (ref 3.5–5.0)
ALT: 15 U/L (ref 0–44)
ANION GAP: 8 (ref 5–15)
AST: 12 U/L — ABNORMAL LOW (ref 15–41)
Alkaline Phosphatase: 102 U/L (ref 38–126)
BILIRUBIN TOTAL: 0.5 mg/dL (ref 0.3–1.2)
BUN: 13 mg/dL (ref 6–20)
CO2: 27 mmol/L (ref 22–32)
Calcium: 8.9 mg/dL (ref 8.9–10.3)
Chloride: 101 mmol/L (ref 98–111)
Creatinine, Ser: 0.55 mg/dL (ref 0.44–1.00)
GFR calc Af Amer: >60 mL/min (ref >60)
GFR calc non Af Amer: >60 mL/min (ref >60)
GLUCOSE: 364 mg/dL — AB (ref 70–99)
POTASSIUM: 3.7 mmol/L (ref 3.5–5.1)
SODIUM: 136 mmol/L (ref 135–145)
TOTAL PROTEIN: 6.9 g/dL (ref 6.5–8.1)

## 2018-03-24 LAB — CBC WITH DIFFERENTIAL/PLATELET
BASOS ABS: 0 10*3/uL (ref 0–0.1)
BASOS PCT: 1 %
EOS ABS: 0.3 10*3/uL (ref 0–0.7)
Eosinophils Relative: 4 %
HCT: 39.9 % (ref 35.0–47.0)
Hemoglobin: 14 g/dL (ref 12.0–16.0)
LYMPHS PCT: 33 %
Lymphs Abs: 2.7 10*3/uL (ref 1.0–3.6)
MCH: 33.2 pg (ref 26.0–34.0)
MCHC: 35.2 g/dL (ref 32.0–36.0)
MCV: 94.3 fL (ref 80.0–100.0)
Monocytes Absolute: 0.5 10*3/uL (ref 0.2–0.9)
Monocytes Relative: 7 %
Neutro Abs: 4.7 10*3/uL (ref 1.4–6.5)
Neutrophils Relative %: 55 %
PLATELETS: 272 10*3/uL (ref 150–440)
RBC: 4.23 MIL/uL (ref 3.80–5.20)
RDW: 14 % (ref 11.5–14.5)
WBC: 8.3 10*3/uL (ref 3.6–11.0)

## 2018-03-24 LAB — GLUCOSE, CAPILLARY
GLUCOSE-CAPILLARY: 270 mg/dL — AB (ref 70–99)
Glucose-Capillary: 308 mg/dL — ABNORMAL HIGH (ref 70–99)
Glucose-Capillary: 195 mg/dL — ABNORMAL HIGH (ref 70–99)

## 2018-03-24 LAB — MAGNESIUM: MAGNESIUM: 1.7 mg/dL (ref 1.7–2.4)

## 2018-03-24 LAB — TROPONIN I
Troponin I: <0.03 ng/mL (ref <0.03)
Troponin I: <0.03 ng/mL (ref <0.03)
Troponin I: <0.03 ng/mL (ref <0.03)

## 2018-03-24 LAB — PROTIME-INR
INR: 0.87
PROTHROMBIN TIME: 11.8 s (ref 11.4–15.2)

## 2018-03-24 LAB — DIGOXIN LEVEL: DIGOXIN LVL: 0.4 ng/mL — AB (ref 0.8–2.0)

## 2018-03-24 LAB — LIPASE, BLOOD: Lipase: 16 U/L (ref 11–51)

## 2018-03-24 LAB — BRAIN NATRIURETIC PEPTIDE: B NATRIURETIC PEPTIDE 5: 91 pg/mL (ref 0.0–100.0)

## 2018-03-24 MED ORDER — DIGOXIN 125 MCG PO TABS
0.1250 mg | ORAL_TABLET | Freq: Every day | ORAL | Status: DC
  Administered 2018-03-24 – 2018-03-25 (×2): 0.125 mg via ORAL
  Filled 2018-03-24 (×2): qty 1

## 2018-03-24 MED ORDER — ENOXAPARIN SODIUM 40 MG/0.4ML ~~LOC~~ SOLN
40.0000 mg | SUBCUTANEOUS | Status: DC
  Administered 2018-03-24: 40 mg via SUBCUTANEOUS
  Filled 2018-03-24: qty 0.4

## 2018-03-24 MED ORDER — INSULIN ASPART 100 UNIT/ML ~~LOC~~ SOLN: 0.0000 [IU] | Freq: Three times a day (TID) | SUBCUTANEOUS | Status: DC

## 2018-03-24 MED ORDER — PANTOPRAZOLE SODIUM 40 MG PO TBEC
40.0000 mg | DELAYED_RELEASE_TABLET | Freq: Every day | ORAL | Status: DC
  Administered 2018-03-24 – 2018-03-25 (×2): 40 mg via ORAL
  Filled 2018-03-24 (×2): qty 1

## 2018-03-24 MED ORDER — INSULIN ASPART 100 UNIT/ML ~~LOC~~ SOLN: 0.0000 [IU] | Freq: Every day | SUBCUTANEOUS | Status: DC

## 2018-03-24 MED ORDER — ASPIRIN EC 81 MG PO TBEC
81.0000 mg | DELAYED_RELEASE_TABLET | Freq: Every day | ORAL | Status: DC
  Administered 2018-03-25: 81 mg via ORAL
  Filled 2018-03-24: qty 1

## 2018-03-24 MED ORDER — METOPROLOL SUCCINATE ER 25 MG PO TB24
25.0000 mg | ORAL_TABLET | Freq: Every day | ORAL | Status: DC
  Administered 2018-03-24: 25 mg via ORAL
  Filled 2018-03-24 (×2): qty 1

## 2018-03-24 MED ORDER — OXYCODONE-ACETAMINOPHEN 5-325 MG PO TABS
1.0000 | ORAL_TABLET | Freq: Four times a day (QID) | ORAL | Status: DC | PRN
  Administered 2018-03-24 – 2018-03-25 (×4): 1 via ORAL
  Filled 2018-03-24 (×4): qty 1

## 2018-03-24 MED ORDER — RANOLAZINE ER 500 MG PO TB12
500.0000 mg | ORAL_TABLET | Freq: Two times a day (BID) | ORAL | Status: DC
  Administered 2018-03-24 – 2018-03-25 (×3): 500 mg via ORAL
  Filled 2018-03-24 (×4): qty 1

## 2018-03-24 MED ORDER — CITALOPRAM HYDROBROMIDE 20 MG PO TABS
20.0000 mg | ORAL_TABLET | Freq: Every day | ORAL | Status: DC
  Administered 2018-03-24 – 2018-03-25 (×2): 20 mg via ORAL
  Filled 2018-03-24 (×2): qty 1

## 2018-03-24 MED ORDER — ONDANSETRON HCL 4 MG/2ML IJ SOLN
4.0000 mg | Freq: Once | INTRAMUSCULAR | Status: AC
  Administered 2018-03-24: 4 mg via INTRAVENOUS
  Filled 2018-03-24: qty 2

## 2018-03-24 MED ORDER — NITROGLYCERIN 0.4 MG SL SUBL: 0.4000 mg | SUBLINGUAL_TABLET | SUBLINGUAL | Status: DC | PRN

## 2018-03-24 MED ORDER — ACETAMINOPHEN 325 MG PO TABS
650.0000 mg | ORAL_TABLET | Freq: Four times a day (QID) | ORAL | Status: DC | PRN
  Administered 2018-03-24: 650 mg via ORAL
  Filled 2018-03-24: qty 2

## 2018-03-24 MED ORDER — INSULIN ASPART 100 UNIT/ML ~~LOC~~ SOLN
0.0000 [IU] | Freq: Three times a day (TID) | SUBCUTANEOUS | Status: DC
  Administered 2018-03-24: 11 [IU] via SUBCUTANEOUS
  Administered 2018-03-25: 8 [IU] via SUBCUTANEOUS
  Administered 2018-03-25: 5 [IU] via SUBCUTANEOUS
  Filled 2018-03-24 (×3): qty 1

## 2018-03-24 MED ORDER — LORAZEPAM 1 MG PO TABS
1.0000 mg | ORAL_TABLET | Freq: Two times a day (BID) | ORAL | Status: DC | PRN
  Administered 2018-03-24 – 2018-03-25 (×2): 1 mg via ORAL
  Filled 2018-03-24 (×2): qty 1

## 2018-03-24 MED ORDER — ONDANSETRON HCL 4 MG/2ML IJ SOLN: 4.0000 mg | Freq: Four times a day (QID) | INTRAMUSCULAR | Status: DC | PRN

## 2018-03-24 MED ORDER — SODIUM CHLORIDE 0.9% FLUSH
3.0000 mL | Freq: Two times a day (BID) | INTRAVENOUS | Status: DC
  Administered 2018-03-24 – 2018-03-25 (×3): 3 mL via INTRAVENOUS

## 2018-03-24 MED ORDER — ACETAMINOPHEN 650 MG RE SUPP: 650.0000 mg | Freq: Four times a day (QID) | RECTAL | Status: DC | PRN

## 2018-03-24 MED ORDER — NITROGLYCERIN 0.4 MG SL SUBL
0.4000 mg | SUBLINGUAL_TABLET | SUBLINGUAL | Status: DC | PRN
  Administered 2018-03-24: 0.4 mg via SUBLINGUAL
  Filled 2018-03-24: qty 1

## 2018-03-24 MED ORDER — INSULIN ASPART 100 UNIT/ML ~~LOC~~ SOLN
10.0000 [IU] | Freq: Three times a day (TID) | SUBCUTANEOUS | Status: DC
  Administered 2018-03-24 – 2018-03-25 (×2): 10 [IU] via SUBCUTANEOUS
  Filled 2018-03-24 (×2): qty 1

## 2018-03-24 MED ORDER — TICAGRELOR 90 MG PO TABS
90.0000 mg | ORAL_TABLET | Freq: Two times a day (BID) | ORAL | Status: DC
  Administered 2018-03-24 – 2018-03-25 (×2): 90 mg via ORAL
  Filled 2018-03-24 (×2): qty 1

## 2018-03-24 MED ORDER — POLYETHYLENE GLYCOL 3350 17 G PO PACK: 17.0000 g | PACK | Freq: Every day | ORAL | Status: DC | PRN

## 2018-03-24 MED ORDER — ATORVASTATIN CALCIUM 80 MG PO TABS
80.0000 mg | ORAL_TABLET | Freq: Every day | ORAL | Status: DC
  Administered 2018-03-24: 80 mg via ORAL
  Filled 2018-03-24 (×2): qty 1
  Filled 2018-03-24: qty 4

## 2018-03-24 MED ORDER — MORPHINE SULFATE (PF) 4 MG/ML IV SOLN
4.0000 mg | Freq: Once | INTRAVENOUS | Status: AC
  Administered 2018-03-24: 4 mg via INTRAVENOUS
  Filled 2018-03-24: qty 1

## 2018-03-24 MED ORDER — ALBUTEROL SULFATE HFA 108 (90 BASE) MCG/ACT IN AERS: 1.0000 | INHALATION_SPRAY | Freq: Four times a day (QID) | RESPIRATORY_TRACT | Status: DC | PRN

## 2018-03-24 MED ORDER — INSULIN GLARGINE 100 UNIT/ML ~~LOC~~ SOLN
70.0000 [IU] | Freq: Every day | SUBCUTANEOUS | Status: DC
  Administered 2018-03-24: 70 [IU] via SUBCUTANEOUS
  Filled 2018-03-24 (×2): qty 0.7

## 2018-03-24 MED ORDER — ALBUTEROL SULFATE (2.5 MG/3ML) 0.083% IN NEBU: 2.5000 mg | INHALATION_SOLUTION | RESPIRATORY_TRACT | Status: DC | PRN

## 2018-03-24 MED ORDER — ONDANSETRON HCL 4 MG PO TABS: 4.0000 mg | ORAL_TABLET | Freq: Four times a day (QID) | ORAL | Status: DC | PRN

## 2018-03-24 NOTE — ED Notes (Signed)
Pt reports that her cardiologist informed her to only take 1 nitro while at home due to low blood pressure. Pt reports baseline low blood pressure, "I take medicines to keep it up". Pt mentation appropriate. Pt alert and oriented X4, active, cooperative, pt in NAD. RR even and unlabored, color WNL.

## 2018-03-24 NOTE — Consult Note (Signed)
Cardiology Consult    Patient ID: Renee Mack MRN: [id number], DOB/AGE: 1965/11/05   Admit date: 03/24/2018 Date of Consult: 03/24/2018  Primary Physician: Olin Hauser, DO Primary Cardiologist: [name], MD Requesting Provider: Chauncey Cruel. Sudini, MD  Patient Profile    Renee Mack is a 52 y.o. female with a history of CAD status post recent MI and drug-eluting stent placement to the LAD x3 with subsequent readmission for subacute stent thrombosis and repeat PCI x 2, combined CHF/ischemic cardiomyopathy with an EF of 30 to 35%, tobacco abuse, COPD, hyperlipidemia, GERD, type 2 diabetes mellitus, and pancreatitis, who is being seen today for the evaluation of chest pain at the request of Dr. Darvin Neighbours.  Past Medical History   Past Medical History:  Diagnosis Date  . Cancer ([place])    vulvular  . Cervical disc disease    a. 01/2018 MRI Cervical spine: Cervical spondylosis with multilevel disc and facet degeneration greatest at C5-6 and C6-7.  Moderate to sev R C5-6, mod L C5-6, and mod bilat C6-7 foraminal stenosis with multilevel mild foraminal stenosis.  Mild C5-6 and mod C6-7 canal stenosis. C6-7 central cord impingement w/ cord flattening.  . Chronic combined systolic (congestive) and diastolic (congestive) heart failure ([place])    a. 12/2017 Echo: EF 30-35%, mid-apicalanteroseptal, ant, and apical AK. Gr1 DD. Mild conc LVH.  Marland Kitchen COPD (chronic obstructive pulmonary disease) ([facility])    not on home oxygen  . Coronary artery disease    a. 12/2017 ACS/PCI: LAD 100p (2.25x26 Resolute Onyx DES), 64m (2.0x12 Resolute Onyx DES), 80d (2.0x15 Resolute Onyx DES), RCA 90p (non-dominant). EF 25-35%. Post-MI course complicated by CGS; b. 12/8240 NSTEMI/subacute thrombosis-->LAD 100 (PTCA + DES x 1); c. 01/2018 NSTEMI/PCI: LM min irregs, LAD 50-60p ISR/hazy (3.5x12 Resolute DES), 43m/d (underexpansion of prior stents-->PTCA). EF 45-50%.  Marland Kitchen GERD (gastroesophageal reflux disease)   . H/O degenerative  disc disease   . Ischemic cardiomyopathy    a. 12/2017 Echo: EF 30-35%.  . Myocardial infarction ([place])    a. 12/2017-->DES to LAD x 3.  . Pancreatitis   . Shingles   . Spinal stenosis   . Tobacco abuse   . Ulcer (traumatic) of oral mucosa   . Urinary incontinence     Past Surgical History:  Procedure Laterality Date  . ABDOMINAL HYSTERECTOMY     partial  . CORONARY STENT INTERVENTION N/A 12/26/2017   Procedure: CORONARY STENT INTERVENTION;  Surgeon: Wellington Hampshire, MD;  Location: [facility];  Service: Cardiovascular;  Laterality: N/A;  . CORONARY STENT INTERVENTION N/A 02/08/2018   Procedure: CORONARY STENT INTERVENTION;  Surgeon: Nelva Bush, MD;  Location: [facility];  Service: Cardiovascular;  Laterality: N/A;  . CORONARY/GRAFT ACUTE MI REVASCULARIZATION N/A 12/19/2017   Procedure: Coronary/Graft Acute MI Revascularization;  Surgeon: Wellington Hampshire, MD;  Location: [facility];  Service: Cardiovascular;  Laterality: N/A;  . ECTOPIC PREGNANCY SURGERY Left   . INTRAVASCULAR ULTRASOUND/IVUS N/A 02/08/2018   Procedure: Intravascular Ultrasound/IVUS;  Surgeon: Nelva Bush, MD;  Location: [facility];  Service: Cardiovascular;  Laterality: N/A;  . LEFT HEART CATH AND CORONARY ANGIOGRAPHY N/A 12/19/2017   Procedure: LEFT HEART CATH AND CORONARY ANGIOGRAPHY;  Surgeon: Wellington Hampshire, MD;  Location: [facility];  Service: Cardiovascular;  Laterality: N/A;  . LEFT HEART CATH AND CORONARY ANGIOGRAPHY N/A 12/26/2017   Procedure: LEFT HEART CATH AND CORONARY ANGIOGRAPHY;  Surgeon: Wellington Hampshire, MD;  Location: [facility];  Service: Cardiovascular;  Laterality: N/A;  . LEFT HEART CATH AND CORONARY ANGIOGRAPHY N/A 02/08/2018   Procedure: LEFT HEART CATH AND CORONARY ANGIOGRAPHY;  Surgeon: Nelva Bush, MD;  Location: [facility];  Service: Cardiovascular;  Laterality: N/A;  . LYMPHADENECTOMY    . SPLENECTOMY, PARTIAL     . vulvulasectomy       Allergies  Allergies  Allergen Reactions  . Bee Venom Itching, Shortness Of Breath and Swelling  . Metformin And Related Shortness Of Breath  . Contrast Allergy Premed Pack [Prednisone & Diphenhydramine]     Rash and itching  . Darvon [Propoxyphene] Itching  . Gabapentin Swelling  . Nsaids Other (See Comments)    Ulcers   . Tramadol Hives    History of Present Illness    52 year old female with a history of CAD, ischemic cardiomyopathy/combined CHF, tobacco abuse, COPD, hyperlipidemia, GERD, type 2 diabetes mellitus, and pancreatitis.  She was admitted to [facility] in early July non-STEMI and underwent diagnostic catheterization revealing severe proximal, mid, and distal LAD disease along with severe disease in a nondominant right coronary artery.  EF was 25 to 35%.  She underwent drug-eluting stent placement to the LAD x3.  Post cath course was comp gated by cardiogenic shock and hypotension requiring vasopressors.  Follow-up echo showed an EF of 30 to 35%.  She was discharged with LifeVest in place.  On July 8, she presented back with chest, neck, and back pain associated with nausea, vomiting, and dyspnea.  Repeat catheterization revealed a total occlusion of the proximal to mid LAD with large thrombus burden.  The LAD was successfully treated with PTCA and also drug-eluting stent to the mid LAD.  She required readmission July 28 with neck and shoulder pain.  At that time, troponins were normal and medical therapy was recommended.  She was admitted again August 8 with neck pain.  MRI of the cervical spine and showed cervical spondylosis with multilevel disc and facet degeneration.  Cervical spinal stenosis was also noted.  Her most recent admission occurred August 19 secondary to recurrent chest discomfort.  Diagnostic catheterization revealed recurrent in-stent thrombus in the proximal portion of the LAD.  This was again successfully treated with a  drug-eluting stent.  Intravascular ultrasound was performed and found under expansion of mid and distal stents.  These were treated with PTCA.  EF improved by left ventriculography to 45 to 50%.  When she was seen in follow-up on September 3, she was feeling well however, she was seen back in the emergency room September 14, secondary to a suprapubic abscess which was evaluated and conservatively managed.  Renee Mack says that since her last visit with [place] in clinic, she has been relatively stable.  She sometimes experiences chest discomfort if she walks long distances but says is very mild and short-lived.  She has noted a slight increase in baseline level of dyspnea on exertion.  She occasionally has lower extremity swelling but this is usually gone when she wakes up in the morning.  Her weight might be up 2 to 3 pounds.  She was in her usual state of health until about 5 AM, when she awoke feeling diaphoretic with chest, neck, and back pain, similar to prior pain in those areas however this time, she also experienced nausea and vomiting.  Chest pain seemed to worsen with vomiting/retching.  As a result of symptoms, she presented to the emergency department where ECG shows no acute ST or T changes and troponin  is normal.  Vital signs have been stable.  Chest x-ray without acute findings.  She currently reports 1-2 over 10 neck, chest, and upper back pain.  This worsens with rotation/flexion/extension of her neck.  Home Medications    Prior to Admission medications   Medication Sig Start Date End Date Taking? Authorizing Provider  acetaminophen (TYLENOL) 325 MG tablet Take 650 mg by mouth every 6 (six) hours as needed for mild pain.    Yes [provider]  albuterol (PROAIR HFA) 108 (90 Base) MCG/ACT inhaler Inhale 1 puff into the lungs every 6 (six) hours as needed for wheezing or shortness of breath.    Yes [provider]  aspirin 81 MG tablet Take 81 mg by mouth daily.   Yes [provider]  atorvastatin (LIPITOR) 80 MG tablet Take 1 tablet (80 mg total) by mouth daily at 6 PM. 01/10/18 03/25/19 Yes Theora Gianotti, NP  citalopram (CELEXA) 20 MG tablet Take 1 tablet (20 mg total) by mouth daily. 02/27/18  Yes Karamalegos, Devonne Doughty, DO  digoxin (LANOXIN) 0.125 MG tablet Take 1 tablet (0.125 mg total) by mouth daily. 02/02/18  Yes Theora Gianotti, NP  esomeprazole (NEXIUM) 40 MG capsule Take 40 mg by mouth daily.    Yes [provider]  insulin aspart (NOVOLOG) 100 UNIT/ML injection Inject 10 Units into the skin 3 (three) times daily before meals. Take 10 units PLUS sliding scale   Yes [provider]  insulin glargine (LANTUS) 100 UNIT/ML injection Inject 0.7 mLs (70 Units total) into the skin at bedtime. 02/27/18  Yes Karamalegos, Devonne Doughty, DO  LORazepam (ATIVAN) 1 MG tablet Take 1 tablet (1 mg total) by mouth 2 (two) times daily as needed for anxiety. 02/27/18  Yes Karamalegos, Devonne Doughty, DO  metoprolol succinate (TOPROL-XL) 25 MG 24 hr tablet Take 1 tablet (25 mg total) by mouth daily. 02/21/18  Yes Theora Gianotti, NP  ondansetron (ZOFRAN) 4 MG tablet Take 1 tablet (4 mg total) by mouth daily as needed. 01/05/18  Yes Schaevitz, Randall An, MD  ranolazine (RANEXA) 500 MG 12 hr tablet Take 1 tablet (500 mg total) by mouth 2 (two) times daily. 03/09/18  Yes Minna Merritts, MD  Ticagrelor 90 mg twice daily    Family History    Family History  Problem Relation Age of Onset  . Cancer Mother   . Osteoporosis Sister   . Heart disease Brother   . Heart attack Father    She indicated that her mother is deceased. She indicated that her father is deceased. She indicated that the status of her sister is unknown. She indicated that the status of her brother is unknown.   Social History    Social History   Socioeconomic History  . Marital status: Widowed    Spouse name: Not on file  . Number of children: Not on file    . Years of education: Not on file  . Highest education level: Not on file  Occupational History  . Not on file  Social Needs  . Financial resource strain: Not very hard  . Food insecurity:    Worry: Never true    Inability: Never true  . Transportation needs:    Medical: No    Non-medical: No  Tobacco Use  . Smoking status: Former Smoker    Packs/day: 0.50    Years: 30.00    Pack years: 15.00    Types: Cigarettes  . Smokeless tobacco: Never  Used  . Tobacco comment: quit 12/19/2017.  Substance and Sexual Activity  . Alcohol use: No  . Drug use: No  . Sexual activity: Not on file  Lifestyle  . Physical activity:    Days per week: 7 days    Minutes per session: 30 min  . Stress: Very much  Relationships  . Social connections:    Talks on phone: More than three times a week    Gets together: Patient refused    Attends religious service: Never    Active member of club or organization: No    Attends meetings of clubs or organizations: Never    Relationship status: Widowed  . Intimate partner violence:    Fear of current or ex partner: No    Emotionally abused: No    Physically abused: No    Forced sexual activity: No  Other Topics Concern  . Not on file  Social History Narrative   Lives in [place] by herself.  Does not routinely exercise.     Review of Systems    General:  No chills, fever. +++ mild wt gain.  +++ diaphoresis this AM. Cardiovascular:  +++ chest pain, +++ dyspnea on exertion, +++ mild, dependent edema, no orthopnea, palpitations, paroxysmal nocturnal dyspnea. Dermatological: No rash, lesions/masses Respiratory: No cough, +++ dyspnea Urologic: No hematuria, dysuria Abdominal:   +++ nausea, +++ vomiting, no diarrhea, bright red blood per rectum, melena, or hematemesis Neurologic:  No visual changes, wkns, changes in mental status. All other systems reviewed and are otherwise negative except as noted above.  Physical Exam    Blood pressure 109/66,  pulse (!) 50, temperature (!) 97.4 F (36.3 C), temperature source Oral, resp. rate 18, height 5\' 1"  (1.549 m), weight 61.2 kg, SpO2 98 %.  General: Pleasant, NAD Psych: Normal affect. Neuro: Alert and oriented X 3. Moves all extremities spontaneously. HEENT: Normal  Neck: Supple without bruits or JVD. Lungs:  Resp regular and unlabored, CTA. Heart: RRR no s3, s4, or murmurs. Abdomen: Soft, non-tender, non-distended, BS + x 4.  Extremities: No clubbing, cyanosis or edema. DP/PT/Radials 2+ and equal bilaterally.  Labs   Recent Labs    03/24/18 0621 03/24/18 0925  TROPONINI <0.03 <0.03   Lab Results  Component Value Date   WBC 8.3 03/24/2018   HGB 14.0 03/24/2018   HCT 39.9 03/24/2018   MCV 94.3 03/24/2018   PLT 272 03/24/2018    Recent Labs  Lab 03/24/18 0621  NA 136  K 3.7  CL 101  CO2 27  BUN 13  CREATININE 0.55  CALCIUM 8.9  PROT 6.9  BILITOT 0.5  ALKPHOS 102  ALT 15  AST 12*  GLUCOSE 364*   Lab Results  Component Value Date   CHOL 129 03/06/2018   HDL 26 (L) 03/06/2018   LDLCALC 60 03/06/2018   TRIG 213 (H) 03/06/2018    Radiology Studies    Dg Chest Portable 1 View  Result Date: 03/24/2018 CLINICAL DATA:  Chest pain. EXAM: PORTABLE CHEST 1 VIEW COMPARISON:  Radiographs 02/06/2018, CT 01/26/2018 FINDINGS: The cardiomediastinal contours are normal. Coronary stent visualized. Subsegmental atelectasis or scarring at the left lung base. Pulmonary vasculature is normal. No consolidation, pleural effusion, or pneumothorax. No acute osseous abnormalities are seen. IMPRESSION: Subsegmental atelectasis or scarring at the left lung base. Otherwise negative radiograph of the chest. Electronically Signed   By: Keith Rake M.D.   On: 03/24/2018 06:50    ECG & Cardiac Imaging    Regular  sinus rhythm, 64, anteroseptal infarct, no acute changes- personally reviewed.  Assessment & Plan    1.  Chest pain/coronary artery disease: Patient with complex cardiac  history as outlined above with stenting of the LAD x3 in the setting of non-STEMI in early July with subsequent repeat drug-eluting stent placement due to stent thrombosis about a week later and then again in late August.  She has known residual severe nondominant RCA disease.  She had been relatively stable over the month of September with only occasional episodes of chest discomfort if she had walked for a long period of time.  She says she did not make too much of those symptoms.  She has also had some increase in baseline level of dyspnea on exertion.  This morning, she awoke at 5 AM with recurrent neck, chest, and back pain that was associated with nausea, vomiting, and diaphoresis.  Pain worsened with retching and also with rotation, flexion, and extension of her neck.  In the emergency department, ECG is without acute changes and first 2 troponins are normal.  She continues to have mild chest discomfort which has been ongoing for 9 hours now.  Given normal enzymes despite prolonged duration, I suspect symptoms are noncardiac.  She has known significant cervical disc disease and spinal stenosis.  If cardiac markers remain normal, would not pursue additional ischemic evaluation.  Recommend continuation of aspirin, statin, beta-blocker, Brilinta, and Ranexa therapy.  2.  Ischemic cardiomyopathy/chronic combined systolic and diastolic congestive heart failure: EF previously 30 to 35%.  Left ventriculography in late August showed 45 to 50%.  She continues to wear a LifeVest.  Follow-up echocardiogram as it has been 3 months since her initial event and revascularization.  If EF remains greater than 35%, we could plan to discontinue LifeVest.  Medical therapy has been limited by soft blood pressures.  She remains on beta-blocker and digoxin only.  No ACE inhibitor/ARB/Entresto/MRA secondary to prior history of relative hypotension.  3.  Hyperlipidemia: LDL 60 in September.  Continue high potency statin  therapy.  4.  Type 2 diabetes mellitus: She says sugars have been running high at home.  A1c was 12.1 in July.  Glucose is elevated at 364 today.  Insulin management per medicine.  6.  COPD with history of tobacco abuse: Patient quit smoking following event in July.  No active wheezing.  I encouraged her to remain off of cigarettes.  Signed, Murray Hodgkins, NP 03/24/2018, 1:29 PM  For questions or updates, please contact   Please consult [email or web address] for contact info under Cardiology/STEMI.

## 2018-03-24 NOTE — ED Notes (Signed)
Pt oxygen 89% on RA after morphine administration. Pt mentation WDL. Denies SOB. Pt alert and oriented X4, active, cooperative, pt in NAD. RR even and unlabored, color WNL. Placed on 2L [place] at this time.

## 2018-03-24 NOTE — ED Notes (Signed)
Report to Aniceto Boss, RN on 2A.

## 2018-03-24 NOTE — ED Notes (Signed)
Pt given drink, offered snack but denies.

## 2018-03-24 NOTE — Progress Notes (Signed)
Talked to Dr. Darvin Neighbours about patient still having 6/10 chest pain, received 8 mg of morphine in the ED, asked if patient can have other pain medication other than a nitro and tylenol, order for Oxycodone given. RN will continue to monitor.

## 2018-03-24 NOTE — H&P (Signed)
[facility] [place] NAME: Renee Mack    MR#:  [id number]  DATE OF BIRTH:  [birth year]  DATE OF ADMISSION:  03/24/2018  PRIMARY CARE PHYSICIAN: Olin Hauser, DO   REQUESTING/REFERRING PHYSICIAN: Dr. Reita Cliche  CHIEF COMPLAINT:   Chief Complaint  Patient presents with  . Chest Pain    HISTORY OF PRESENT ILLNESS:  Renee Mack  is a 52 y.o. female with a known history of chronic systolic CHF with ejection fraction 30 to 35%, CAD with recent catheterization for subacute stent thrombosis of LAD, tobacco use, COPD, diabetes mellitus presents to the emergency room complaining of acute onset of midsternal chest pain earlier today.  Complains of radiation down the left arm.  No aggravating or relieving factors.  Patient is on Ranexa.  She mentions no chest pain since her recent cardiac catheterization.  Here her troponin has been negative.  EKG shows nothing acute.  Due to ongoing chest pain patient is being admitted under observation to rule out acute coronary syndrome.  PAST MEDICAL HISTORY:   Past Medical History:  Diagnosis Date  . Cancer ([address])    vulvular  . Cervical disc disease    a. 01/2018 MRI Cervical spine: Cervical spondylosis with multilevel disc and facet degeneration greatest at C5-6 and C6-7.  Moderate to sev R C5-6, mod L C5-6, and mod bilat C6-7 foraminal stenosis with multilevel mild foraminal stenosis.  Mild C5-6 and mod C6-7 canal stenosis. C6-7 central cord impingement w/ cord flattening.  . Chronic combined systolic (congestive) and diastolic (congestive) heart failure ([place])    a. 12/2017 Echo: EF 30-35%, mid-apicalanteroseptal, ant, and apical AK. Gr1 DD. Mild conc LVH.  [place] Kitchen COPD (chronic obstructive pulmonary disease) (HCC)    not on home oxygen  . Coronary artery disease    a. 12/2017 ACS/PCI: LAD 100p (2.25x26 Resolute Onyx DES), 77m (2.0x12 Resolute Onyx DES), 80d (2.0x15 Resolute Onyx DES), RCA 90p (non-dominant). EF  25-35%. Post-MI course complicated by CGS; b. 0/9983 NSTEMI/subacute thrombosis-->LAD 100 (PTCA + DES x 1); c. 01/2018 NSTEMI/PCI: LM min irregs, LAD 50-60p ISR/hazy (3.5x12 Resolute DES), 25m/d (underexpansion of prior stents-->PTCA). EF 45-50%.  Marland Kitchen GERD (gastroesophageal reflux disease)   . H/O degenerative disc disease   . Ischemic cardiomyopathy    a. 12/2017 Echo: EF 30-35%.  . Myocardial infarction ([place])    a. 12/2017-->DES to LAD x 3.  . Pancreatitis   . Shingles   . Spinal stenosis   . Tobacco abuse   . Ulcer (traumatic) of oral mucosa   . Urinary incontinence     PAST SURGICAL HISTORY:   Past Surgical History:  Procedure Laterality Date  . ABDOMINAL HYSTERECTOMY     partial  . CORONARY STENT INTERVENTION N/A 12/26/2017   Procedure: CORONARY STENT INTERVENTION;  Surgeon: Wellington Hampshire, MD;  Location: [facility];  Service: Cardiovascular;  Laterality: N/A;  . CORONARY STENT INTERVENTION N/A 02/08/2018   Procedure: CORONARY STENT INTERVENTION;  Surgeon: Nelva Bush, MD;  Location: [place];  Service: Cardiovascular;  Laterality: N/A;  . CORONARY/GRAFT ACUTE MI REVASCULARIZATION N/A 12/19/2017   Procedure: Coronary/Graft Acute MI Revascularization;  Surgeon: Wellington Hampshire, MD;  Location: [place];  Service: Cardiovascular;  Laterality: N/A;  . ECTOPIC PREGNANCY SURGERY Left   . INTRAVASCULAR ULTRASOUND/IVUS N/A 02/08/2018   Procedure: Intravascular Ultrasound/IVUS;  Surgeon: Nelva Bush, MD;  Location: [facility];  Service: Cardiovascular;  Laterality: N/A;  .  LEFT HEART CATH AND CORONARY ANGIOGRAPHY N/A 12/19/2017   Procedure: LEFT HEART CATH AND CORONARY ANGIOGRAPHY;  Surgeon: Wellington Hampshire, MD;  Location: [place];  Service: Cardiovascular;  Laterality: N/A;  . LEFT HEART CATH AND CORONARY ANGIOGRAPHY N/A 12/26/2017   Procedure: LEFT HEART CATH AND CORONARY ANGIOGRAPHY;  Surgeon: Wellington Hampshire, MD;  Location:  [facility];  Service: Cardiovascular;  Laterality: N/A;  . LEFT HEART CATH AND CORONARY ANGIOGRAPHY N/A 02/08/2018   Procedure: LEFT HEART CATH AND CORONARY ANGIOGRAPHY;  Surgeon: Nelva Bush, MD;  Location: [facility];  Service: Cardiovascular;  Laterality: N/A;  . LYMPHADENECTOMY    . SPLENECTOMY, PARTIAL    . vulvulasectomy      SOCIAL HISTORY:   Social History   Tobacco Use  . Smoking status: Former Smoker    Packs/day: 0.50    Years: 30.00    Pack years: 15.00    Types: Cigarettes  . Smokeless tobacco: Never Used  . Tobacco comment: quit 12/19/2017.  Substance Use Topics  . Alcohol use: No    FAMILY HISTORY:   Family History  Problem Relation Age of Onset  . Cancer Mother   . Osteoporosis Sister   . Heart disease Brother   . Heart attack Father     DRUG ALLERGIES:   Allergies  Allergen Reactions  . Bee Venom Itching, Shortness Of Breath and Swelling  . Metformin And Related Shortness Of Breath  . Contrast Allergy Premed Pack [Prednisone & Diphenhydramine]     Rash and itching  . Darvon [Propoxyphene] Itching  . Gabapentin Swelling  . Nsaids Other (See Comments)    Ulcers   . Tramadol Hives    REVIEW OF SYSTEMS:   Review of Systems  Constitutional: Positive for malaise/fatigue. Negative for chills, fever and weight loss.  HENT: Negative for hearing loss and nosebleeds.   Eyes: Negative for blurred vision, double vision and pain.  Respiratory: Negative for cough, hemoptysis, sputum production, shortness of breath and wheezing.   Cardiovascular: Positive for chest pain. Negative for palpitations, orthopnea and leg swelling.  Gastrointestinal: Negative for abdominal pain, constipation, diarrhea, nausea and vomiting.  Genitourinary: Negative for dysuria and hematuria.  Musculoskeletal: Negative for back pain, falls and myalgias.  Skin: Negative for rash.  Neurological: Negative for dizziness, tremors, sensory change, speech change,  focal weakness, seizures and headaches.  Endo/Heme/Allergies: Does not bruise/bleed easily.  Psychiatric/Behavioral: Negative for depression and memory loss. The patient is not nervous/anxious.     MEDICATIONS AT HOME:   Prior to Admission medications   Medication Sig Start Date End Date Taking? Authorizing Provider  acetaminophen (TYLENOL) 325 MG tablet Take 650 mg by mouth every 6 (six) hours as needed for mild pain.    Yes [provider]  albuterol (PROAIR HFA) 108 (90 Base) MCG/ACT inhaler Inhale 1 puff into the lungs every 6 (six) hours as needed for wheezing or shortness of breath.    Yes [provider]  aspirin 81 MG tablet Take 81 mg by mouth daily.   Yes [provider]  atorvastatin (LIPITOR) 80 MG tablet Take 1 tablet (80 mg total) by mouth daily at 6 PM. 01/10/18 03/25/19 Yes Theora Gianotti, NP  citalopram (CELEXA) 20 MG tablet Take 1 tablet (20 mg total) by mouth daily. 02/27/18  Yes Karamalegos, Devonne Doughty, DO  digoxin (LANOXIN) 0.125 MG tablet Take 1 tablet (0.125 mg total) by mouth daily. 02/02/18  Yes Theora Gianotti, NP  esomeprazole (NEXIUM) 40 MG capsule Take 40 mg by mouth daily.    Yes [provider]  insulin aspart (NOVOLOG) 100 UNIT/ML injection Inject 10 Units into the skin 3 (three) times daily before meals. Take 10 units PLUS sliding scale   Yes [provider]  insulin glargine (LANTUS) 100 UNIT/ML injection Inject 0.7 mLs (70 Units total) into the skin at bedtime. 02/27/18  Yes Karamalegos, Devonne Doughty, DO  LORazepam (ATIVAN) 1 MG tablet Take 1 tablet (1 mg total) by mouth 2 (two) times daily as needed for anxiety. 02/27/18  Yes Karamalegos, Devonne Doughty, DO  metoprolol succinate (TOPROL-XL) 25 MG 24 hr tablet Take 1 tablet (25 mg total) by mouth daily. 02/21/18  Yes Theora Gianotti, NP  ondansetron (ZOFRAN) 4 MG tablet Take 1 tablet (4 mg total) by mouth daily as needed. 01/05/18  Yes Schaevitz, Randall An, MD  ranolazine (RANEXA) 500 MG 12 hr tablet Take 1 tablet (500 mg total) by mouth 2 (two) times daily. 03/09/18  Yes Minna Merritts, MD     VITAL SIGNS:  Blood pressure 115/63, pulse (!) 54, temperature (!) 97.4 F (36.3 C), temperature source Oral, resp. rate 18, height 5\' 1"  (1.549 m), weight 61.2 kg, SpO2 98 %.  PHYSICAL EXAMINATION:  Physical Exam  GENERAL:  52 y.o.-year-old patient lying in the bed with no acute distress.  EYES: Pupils equal, round, reactive to light and accommodation. No scleral icterus. Extraocular muscles intact.  HEENT: Head atraumatic, normocephalic. Oropharynx and nasopharynx clear. No oropharyngeal erythema, moist oral mucosa  NECK:  Supple, no jugular venous distention. No thyroid enlargement, no tenderness.  LUNGS: Normal breath sounds bilaterally, no wheezing, rales, rhonchi. No use of accessory muscles of respiration.  CARDIOVASCULAR: S1, S2 normal. No murmurs, rubs, or gallops.  ABDOMEN: Soft, nontender, nondistended. Bowel sounds present. No organomegaly or mass.  EXTREMITIES: No pedal edema, cyanosis, or clubbing. + 2 pedal & radial pulses b/l.   NEUROLOGIC: Cranial nerves II through XII are intact. No focal Motor or sensory deficits appreciated b/l PSYCHIATRIC: The patient is alert and oriented x 3. Good affect.  SKIN: No obvious rash, lesion, or ulcer.   LABORATORY PANEL:   CBC Recent Labs  Lab 03/24/18 0621  WBC 8.3  HGB 14.0  HCT 39.9  PLT 272   ------------------------------------------------------------------------------------------------------------------  Chemistries  Recent Labs  Lab 03/24/18 0621  NA 136  K 3.7  CL 101  CO2 27  GLUCOSE 364*  BUN 13  CREATININE 0.55  CALCIUM 8.9  MG 1.7  AST 12*  ALT 15  ALKPHOS 102  BILITOT 0.5   ------------------------------------------------------------------------------------------------------------------  Cardiac Enzymes Recent Labs  Lab 03/24/18 0925  TROPONINI  <0.03   ------------------------------------------------------------------------------------------------------------------  RADIOLOGY:  Dg Chest Portable 1 View  Result Date: 03/24/2018 CLINICAL DATA:  Chest pain. EXAM: PORTABLE CHEST 1 VIEW COMPARISON:  Radiographs 02/06/2018, CT 01/26/2018 FINDINGS: The cardiomediastinal contours are normal. Coronary stent visualized. Subsegmental atelectasis or scarring at the left lung base. Pulmonary vasculature is normal. No consolidation, pleural effusion, or pneumothorax. No acute osseous abnormalities are seen. IMPRESSION: Subsegmental atelectasis or scarring at the left lung base. Otherwise negative radiograph of the chest. Electronically Signed   By: Keith Rake M.D.   On: 03/24/2018 06:50     IMPRESSION AND PLAN:   *Chest pain in patient with CAD and recent stents placed.  Troponin is normal and EKG unchanged.  Patient will be admitted under observation to telemetry unit.  Repeat troponin.  Continue antiplatelets  and Ranexa.  Discussed with cardiology who will see the patient to assess further management.  *Diabetes mellitus.  Check hemoglobin A1c.  Continue home dose of insulin.  Sliding scale insulin added.  *COPD.  Stable.  No wheezing.  DVT prophylaxis with Lovenox  All the records are reviewed and case discussed with ED provider. Management plans discussed with the patient, family and they are in agreement.  CODE STATUS: FULL CODE  TOTAL TIME TAKING CARE OF THIS PATIENT: 40 minutes.   Leia Alf Mechell Girgis M.D on 03/24/2018 at 1:50 PM  Between 7am to 6pm - Pager - [phone]  After 6pm go to [email or web address] - password [email or web address] [facility]  Office  [phone]  CC: Primary care physician; Olin Hauser, DO  Note: This dictation was prepared with Dragon dictation along with smaller phrase technology. Any transcriptional errors that result from this process are unintentional.

## 2018-03-24 NOTE — ED Provider Notes (Signed)
[facility] Emergency Department Provider Note ____________________________________________   I have reviewed the triage vital signs and the triage nursing note.  HISTORY  Chief Complaint Chest Pain   Historian Patient  HPI Renee Mack is a 52 y.o. female with history of CAD, MI, s/p 5 stents in July, cardiologist Dr. Fletcher Anon, wears lifeVest, presents with central and upper left-sided chest pain radiating into her left neck and left shoulder down her left arm that woke her up around 5 AM this morning.  Associate with nausea.  No diarrhea.  States that she is felt just generally "off "for the last couple days, but does not typically have daily chest pain.  She took nitroglycerin which not he did seem to help much.  She took 4 baby aspirin this morning.  Last time she used nitroglycerin was about 2 weeks ago.  States that she has more blockages that need stents.  Pain is still currently 8 out of 10.  Denies any trauma.  This feels similar to prior cardiac chest pain.  Chronic shortness of breath, no change in that.  Has indigestion, but this does not feel like indigestion to her.     Past Medical History:  Diagnosis Date  . Cancer ([place])    vulvular  . Cervical disc disease    a. 01/2018 MRI Cervical spine: Cervical spondylosis with multilevel disc and facet degeneration greatest at C5-6 and C6-7.  Moderate to sev R C5-6, mod L C5-6, and mod bilat C6-7 foraminal stenosis with multilevel mild foraminal stenosis.  Mild C5-6 and mod C6-7 canal stenosis. C6-7 central cord impingement w/ cord flattening.  . Chronic combined systolic (congestive) and diastolic (congestive) heart failure ([place])    a. 12/2017 Echo: EF 30-35%, mid-apicalanteroseptal, ant, and apical AK. Gr1 DD. Mild conc LVH.  Marland Kitchen COPD (chronic obstructive pulmonary disease) (HCC)    not on home oxygen  . Coronary artery disease    a. 12/2017 ACS/PCI: LAD 100p (2.25x26 Resolute Onyx DES), 46m (2.0x12  Resolute Onyx DES), 80d (2.0x15 Resolute Onyx DES), RCA 90p (non-dominant). EF 25-35%. Post-MI course complicated by CGS; b. 10/537 NSTEMI/subacute thrombosis-->LAD 100 (PTCA + DES x 1); c. 01/2018 NSTEMI/PCI: LM min irregs, LAD 50-60p ISR/hazy (3.5x12 Resolute DES), 78m/d (underexpansion of prior stents-->PTCA). EF 45-50%.  Marland Kitchen GERD (gastroesophageal reflux disease)   . H/O degenerative disc disease   . Ischemic cardiomyopathy    a. 12/2017 Echo: EF 30-35%.  . Myocardial infarction ([place])    a. 12/2017-->DES to LAD x 3.  . Pancreatitis   . Shingles   . Spinal stenosis   . Tobacco abuse   . Ulcer (traumatic) of oral mucosa   . Urinary incontinence     Patient Active Problem List   Diagnosis Date Noted  . History of ST elevation myocardial infarction (STEMI) 02/27/2018  . CAD (coronary artery disease) 02/27/2018  . Hyperlipidemia associated with type 2 diabetes mellitus ([place]) 02/27/2018  . Anxiety associated with depression 02/27/2018  . Chronic systolic heart failure ([place])   . Chest pain 12/26/2017  . Ischemic cardiomyopathy   . Former smoker   . Protein-calorie malnutrition, severe 07/18/2017  . Uncontrolled type 2 diabetes with neuropathy ([place]) 12/28/2014  . COPD (chronic obstructive pulmonary disease) ([place]) 12/28/2014  . GERD (gastroesophageal reflux disease) 12/28/2014  . History of shingles 12/28/2014  . Hyperlipidemia LDL goal <70 12/28/2014    Past Surgical History:  Procedure Laterality Date  . ABDOMINAL HYSTERECTOMY     partial  . CORONARY STENT  INTERVENTION N/A 12/26/2017   Procedure: CORONARY STENT INTERVENTION;  Surgeon: Wellington Hampshire, MD;  Location: [facility];  Service: Cardiovascular;  Laterality: N/A;  . CORONARY STENT INTERVENTION N/A 02/08/2018   Procedure: CORONARY STENT INTERVENTION;  Surgeon: Nelva Bush, MD;  Location: [facility];  Service: Cardiovascular;  Laterality: N/A;  . CORONARY/GRAFT ACUTE MI REVASCULARIZATION N/A 12/19/2017    Procedure: Coronary/Graft Acute MI Revascularization;  Surgeon: Wellington Hampshire, MD;  Location: [facility];  Service: Cardiovascular;  Laterality: N/A;  . ECTOPIC PREGNANCY SURGERY Left   . INTRAVASCULAR ULTRASOUND/IVUS N/A 02/08/2018   Procedure: Intravascular Ultrasound/IVUS;  Surgeon: Nelva Bush, MD;  Location: [facility];  Service: Cardiovascular;  Laterality: N/A;  . LEFT HEART CATH AND CORONARY ANGIOGRAPHY N/A 12/19/2017   Procedure: LEFT HEART CATH AND CORONARY ANGIOGRAPHY;  Surgeon: Wellington Hampshire, MD;  Location: [place] [facility];  Service: Cardiovascular;  Laterality: N/A;  . LEFT HEART CATH AND CORONARY ANGIOGRAPHY N/A 12/26/2017   Procedure: LEFT HEART CATH AND CORONARY ANGIOGRAPHY;  Surgeon: Wellington Hampshire, MD;  Location: [facility];  Service: Cardiovascular;  Laterality: N/A;  . LEFT HEART CATH AND CORONARY ANGIOGRAPHY N/A 02/08/2018   Procedure: LEFT HEART CATH AND CORONARY ANGIOGRAPHY;  Surgeon: Nelva Bush, MD;  Location: [facility];  Service: Cardiovascular;  Laterality: N/A;  . LYMPHADENECTOMY    . SPLENECTOMY, PARTIAL    . vulvulasectomy      Prior to Admission medications   Medication Sig Start Date End Date Taking? Authorizing Provider  acetaminophen (TYLENOL) 325 MG tablet Take 650 mg by mouth every 6 (six) hours as needed for mild pain.    Yes [provider]  albuterol (PROAIR HFA) 108 (90 Base) MCG/ACT inhaler Inhale 1 puff into the lungs every 6 (six) hours as needed for wheezing or shortness of breath.    Yes [provider]  aspirin 81 MG tablet Take 81 mg by mouth daily.   Yes [provider]  atorvastatin (LIPITOR) 80 MG tablet Take 1 tablet (80 mg total) by mouth daily at 6 PM. 01/10/18 03/25/19 Yes Theora Gianotti, NP  citalopram (CELEXA) 20 MG tablet Take 1 tablet (20 mg total) by mouth daily. 02/27/18  Yes Karamalegos, Devonne Doughty, DO  digoxin (LANOXIN) 0.125 MG tablet  Take 1 tablet (0.125 mg total) by mouth daily. 02/02/18  Yes Theora Gianotti, NP  esomeprazole (NEXIUM) 40 MG capsule Take 40 mg by mouth daily.    Yes [provider]  insulin aspart (NOVOLOG) 100 UNIT/ML injection Inject 10 Units into the skin 3 (three) times daily before meals. Take 10 units PLUS sliding scale   Yes [provider]  insulin glargine (LANTUS) 100 UNIT/ML injection Inject 0.7 mLs (70 Units total) into the skin at bedtime. 02/27/18  Yes Karamalegos, Devonne Doughty, DO  LORazepam (ATIVAN) 1 MG tablet Take 1 tablet (1 mg total) by mouth 2 (two) times daily as needed for anxiety. 02/27/18  Yes Karamalegos, Devonne Doughty, DO  metoprolol succinate (TOPROL-XL) 25 MG 24 hr tablet Take 1 tablet (25 mg total) by mouth daily. 02/21/18  Yes Theora Gianotti, NP  ondansetron (ZOFRAN) 4 MG tablet Take 1 tablet (4 mg total) by mouth daily as needed. 01/05/18  Yes Schaevitz, Randall An, MD  ranolazine (RANEXA) 500 MG 12 hr tablet Take 1 tablet (500 mg total) by mouth 2 (two) times daily. 03/09/18  Yes Minna Merritts, MD  Allergies  Allergen Reactions  . Bee Venom Itching, Shortness Of Breath and Swelling  . Metformin And Related Shortness Of Breath  . Contrast Allergy Premed Pack [Prednisone & Diphenhydramine]     Rash and itching  . Darvon [Propoxyphene] Itching  . Gabapentin Swelling  . Nsaids Other (See Comments)    Ulcers   . Tramadol Hives    Family History  Problem Relation Age of Onset  . Cancer Mother   . Osteoporosis Sister   . Heart disease Brother   . Heart attack Father     Social History Social History   Tobacco Use  . Smoking status: Former Smoker    Packs/day: 0.50    Years: 30.00    Pack years: 15.00    Types: Cigarettes  . Smokeless tobacco: Never Used  . Tobacco comment: quit 12/19/2017.  Substance Use Topics  . Alcohol use: No  . Drug use: No    Review of Systems  Constitutional: Negative for fever. Eyes: Negative  for visual changes. ENT: Negative for sore throat. Cardiovascular: Positive as per HPI for chest pain. Respiratory: Chronic but no new changing or worsening shortness of breath. Gastrointestinal: Negative for abdominal pain, vomiting and diarrhea. Genitourinary: Negative for dysuria. Musculoskeletal: Negative for back pain. Skin: Negative for rash. Neurological: Negative for headache.  ____________________________________________   PHYSICAL EXAM:  VITAL SIGNS: ED Triage Vitals  Enc Vitals Group     BP 03/24/18 0614 (!) 119/57     Pulse Rate 03/24/18 0614 (!) 53     Resp 03/24/18 0614 16     Temp 03/24/18 0614 (!) 97.4 F (36.3 C)     Temp Source 03/24/18 0614 Oral     SpO2 03/24/18 0614 97 %     Weight 03/24/18 0616 135 lb (61.2 kg)     Height 03/24/18 0616 5\' 1"  (1.549 m)     Head Circumference --      Peak Flow --      Pain Score 03/24/18 0616 8     Pain Loc --      Pain Edu? --      Excl. in [place]? --      Constitutional: Alert and oriented.  HEENT      Head: Normocephalic and atraumatic.      Eyes: Conjunctivae are normal. Pupils equal and round.       Ears:         Nose: No congestion/rhinnorhea.      Mouth/Throat: Mucous membranes are moist.      Neck: No stridor.  Spastic left trapezius muscle over the top of the left shoulder. Cardiovascular/Chest: Normal rate, regular rhythm.  No murmurs, rubs, or gallops. Respiratory: Normal respiratory effort without tachypnea nor retractions. Breath sounds are clear and equal bilaterally. No wheezes/rales/rhonchi. Gastrointestinal: Soft. No distention, no guarding, no rebound. Nontender.    Genitourinary/rectal:Deferred Musculoskeletal: Nontender with normal range of motion in all extremities. No joint effusions.  No lower extremity tenderness.  No edema. Neurologic:  Normal speech and language. No gross or focal neurologic deficits are appreciated. Skin:  Skin is warm, dry and intact. No rash noted. Psychiatric: Mood and  affect are normal. Speech and behavior are normal. Patient exhibits appropriate insight and judgment.   ____________________________________________  LABS (pertinent positives/negatives) I, Lisa Roca, MD the attending physician have reviewed the labs noted below.  Labs Reviewed  COMPREHENSIVE METABOLIC PANEL - Abnormal; Notable for the following components:      Result Value   Glucose, Bld  364 (*)    Albumin 3.4 (*)    AST 12 (*)    All other components within normal limits  DIGOXIN LEVEL - Abnormal; Notable for the following components:   Digoxin Level 0.4 (*)    All other components within normal limits  HEMOGLOBIN A1C - Abnormal; Notable for the following components:   Hgb A1c MFr Bld 11.9 (*)    All other components within normal limits  GLUCOSE, CAPILLARY - Abnormal; Notable for the following components:   Glucose-Capillary 270 (*)    All other components within normal limits  GLUCOSE, CAPILLARY - Abnormal; Notable for the following components:   Glucose-Capillary 308 (*)    All other components within normal limits  GLUCOSE, CAPILLARY - Abnormal; Notable for the following components:   Glucose-Capillary 195 (*)    All other components within normal limits  LIPASE, BLOOD  BRAIN NATRIURETIC PEPTIDE  TROPONIN I  CBC WITH DIFFERENTIAL/PLATELET  PROTIME-INR  MAGNESIUM  TROPONIN I  TROPONIN I  TROPONIN I    ____________________________________________    EKG I, Lisa Roca, MD, the attending physician have personally viewed and interpreted all ECGs.  60 bpm.  normal sinus rhythm.  Narrow QS.  Normal axis.  Q waves anteriorly.  Nonspecific ST and T wave.  64 bpm.  Normal sinus rhythm.  Narrow QRS.  Normal axis.  Q waves anteriorly.  Nonspecific ST-T wave.  No change. ____________________________________________  RADIOLOGY   Chest x-ray portable one view, radiologist report reviewed: IMPRESSION: Subsegmental atelectasis or scarring at the left lung  base. Otherwise negative radiograph of the chest. __________________________________________  PROCEDURES  Procedure(s) performed: None  Procedures  Critical Care performed: None   ____________________________________________  ED COURSE / ASSESSMENT AND PLAN  Pertinent labs & imaging results that were available during my care of the patient were reviewed by me and considered in my medical decision making (see chart for details).    I reviewed patient's primary physician note which specifically summarized patient's coronary artery disease with angina and multiple MI including STEMI and recurrent N STEMI, status post drug-eluting stent x3.  Currently on Brilinta, aspirin, statin, Ranexa, and low-dose beta-blocker as well as digoxin.   Initial ECG without acute findings.  Initial troponin negative.  Discussed with patient possibility for other sources of chest pain -- does not seem like GERD, musculoskeletal, pulmonary source pna or PE.    Got some initial relief with morphine, but on reevaluation around 11:45, patient states chest pain still there and she is diaphoretic.  Will plan for hospital observation for chest pain in patient with known CAD.    CONSULTATIONS:   Dr. Fletcher Anon, her cardiologist, we discussed case -- if chest pain free and repeat troponin neg ok for outpatient.   Patient / Family / Caregiver informed of clinical course, medical decision-making process, and agree with plan.   ___________________________________________   FINAL CLINICAL IMPRESSION(S) / ED DIAGNOSES   Final diagnoses:  Atypical chest pain      ___________________________________________         Note: This dictation was prepared with Dragon dictation. Any transcriptional errors that result from this process are unintentional    Lisa Roca, MD 03/25/18 [phone]

## 2018-03-24 NOTE — ED Notes (Signed)
ED Provider at bedside. 

## 2018-03-24 NOTE — Progress Notes (Signed)
Inpatient Diabetes Program Recommendations  AACE/ADA: New Consensus Statement on Inpatient Glycemic Control (2015)  Target Ranges:  Prepandial:   less than 140 mg/dL      Peak postprandial:   less than 180 mg/dL (1-2 hours)      Critically ill patients:  140 - 180 mg/dL   Results for [name], [name] (MRN [id number]) as of 03/24/2018 14:43  Ref. Range 03/24/2018 14:09  Glucose-Capillary Latest Ref Range: 70 - 99 mg/dL 270 (H)   Results for Renee Mack, Renee Mack (MRN [id number]) as of 03/24/2018 14:43  Ref. Range 06/12/2017 15:37 12/19/2017 09:34  Hemoglobin A1C Latest Ref Range: 4.8 - 5.6 % 15.8 (H) 12.1 (H)   Admit with: CP  History: DM, CHF, COPD  Home DM Meds: Lantus 70 units QHS       Novolog 10 units TID with meals       Novolog SSI  Current Orders: Lantus 70 units QHS      Novolog Moderate Correction Scale/ SSI (0-15 units) TID AC + HS      Novolog 10 units TID with meals      Note Lantus to start tonight.  Novolog to start this afternoon.  Current A1c level pending.  Last one done in July showed some improvement since December of 2018.  Patient was counseled by the DM Coordinator on 12/20/2017 of this year.    --Will follow patient during hospitalization--  Wyn Quaker RN, MSN, CDE Diabetes Coordinator Inpatient Glycemic Control Team Team Pager: [phone] (8a-5p)

## 2018-03-24 NOTE — ED Triage Notes (Signed)
PT HERE FROM HOME , C/O CHEST PAIN MID CHEST IN TO BACK  PT HAS A LIFE VEST ON AT THIS TIME (NO SHOCK ) REPORTS VOMITING THIS AM . 324 OF ASA IN ROUTE

## 2018-03-24 NOTE — ED Notes (Signed)
Pt sleeping in stretcher at this time

## 2018-03-25 ENCOUNTER — Observation Stay (HOSPITAL_BASED_OUTPATIENT_CLINIC_OR_DEPARTMENT_OTHER)
Admit: 2018-03-25 | Discharge: 2018-03-25 | Disposition: A | Discharge status: Home / Self Care | Payer: Medicaid Other | Attending: Cardiovascular Disease | Admitting: Cardiovascular Disease

## 2018-03-25 DIAGNOSIS — I25118 Atherosclerotic heart disease of native coronary artery with other forms of angina pectoris: Secondary | ICD-10-CM | POA: Diagnosis not present

## 2018-03-25 DIAGNOSIS — I255 Ischemic cardiomyopathy: Secondary | ICD-10-CM | POA: Diagnosis not present

## 2018-03-25 DIAGNOSIS — I340 Nonrheumatic mitral (valve) insufficiency: Secondary | ICD-10-CM

## 2018-03-25 DIAGNOSIS — R0789 Other chest pain: Secondary | ICD-10-CM | POA: Diagnosis not present

## 2018-03-25 DIAGNOSIS — J432 Centrilobular emphysema: Secondary | ICD-10-CM | POA: Diagnosis not present

## 2018-03-25 LAB — GLUCOSE, CAPILLARY
GLUCOSE-CAPILLARY: 262 mg/dL — AB (ref 70–99)
Glucose-Capillary: 230 mg/dL — ABNORMAL HIGH (ref 70–99)

## 2018-03-25 LAB — HEMOGLOBIN A1C
HEMOGLOBIN A1C: 11.9 % — AB (ref 4.8–5.6)
MEAN PLASMA GLUCOSE: 295 mg/dL

## 2018-03-25 LAB — ECHOCARDIOGRAM COMPLETE
Height: 61 in
WEIGHTICAEL: 2200 [oz_av]

## 2018-03-25 MED ORDER — INSULIN ASPART 100 UNIT/ML ~~LOC~~ SOLN: 14.0000 [IU] | Freq: Three times a day (TID) | SUBCUTANEOUS | 11 refills | Status: DC

## 2018-03-25 MED ORDER — INSULIN ASPART 100 UNIT/ML ~~LOC~~ SOLN
14.0000 [IU] | Freq: Three times a day (TID) | SUBCUTANEOUS | Status: DC
  Administered 2018-03-25: 14 [IU] via SUBCUTANEOUS
  Filled 2018-03-25: qty 1

## 2018-03-25 NOTE — Discharge Instructions (Signed)
Resume diet and activity as before ° ° °

## 2018-03-25 NOTE — Progress Notes (Signed)
Patient being driven home by sister- to be escorted to vehicle via wheelchair.

## 2018-03-25 NOTE — Plan of Care (Signed)
  Problem: Pain Managment: Goal: General experience of comfort will improve Outcome: Progressing   

## 2018-03-25 NOTE — Progress Notes (Signed)
Progress Note  Patient Name: Renee Mack Date of Encounter: 03/25/2018  Primary Cardiologist: [name], MD   Subjective   No further chest pain Neck pain has moderated Concern the life vest may have exacerbated her neck discomfort Walking outside the room in the hallway with no symptoms of chest discomfort or significant shortness of breath concerning for angina   Inpatient Medications    Scheduled Meds: . aspirin EC  81 mg Oral Daily  . atorvastatin  80 mg Oral q1800  . citalopram  20 mg Oral Daily  . digoxin  0.125 mg Oral Daily  . enoxaparin (LOVENOX) injection  40 mg Subcutaneous Q24H  . insulin aspart  0-15 Units Subcutaneous TID WC  . insulin aspart  0-5 Units Subcutaneous QHS  . insulin aspart  14 Units Subcutaneous TID AC  . insulin glargine  70 Units Subcutaneous QHS  . metoprolol succinate  25 mg Oral Daily  . pantoprazole  40 mg Oral Daily  . ranolazine  500 mg Oral BID  . sodium chloride flush  3 mL Intravenous Q12H  . ticagrelor  90 mg Oral BID   Continuous Infusions:  PRN Meds: acetaminophen **OR** acetaminophen, albuterol, LORazepam, nitroGLYCERIN, ondansetron **OR** ondansetron (ZOFRAN) IV, oxyCODONE-acetaminophen, polyethylene glycol   Vital Signs    Vitals:   03/25/18 0457 03/25/18 0636 03/25/18 0804 03/25/18 1053  BP: 108/62  100/65 98/61  Pulse: (!) 56  (!) 57 (!) 51  Resp: 16   14  Temp: 97.7 F (36.5 C)  98 F (36.7 C)   TempSrc: Oral  Oral   SpO2: 94%  98% 97%  Weight:  62.4 kg    Height:        Intake/Output Summary (Last 24 hours) at 03/25/2018 1542 Last data filed at 03/25/2018 1058 Gross per 24 hour  Intake 243 ml  Output 0 ml  Net 243 ml   Filed Weights   03/24/18 0616 03/25/18 0636  Weight: 61.2 kg 62.4 kg    Telemetry    Normal sinus rhythm- personally Reviewed  ECG      Physical Exam   GEN: No acute distress.   Neck: No JVD Cardiac: RRR, no murmurs, rubs, or gallops.  Respiratory: Clear to  auscultation bilaterally. GI: Soft, nontender, non-distended  MS: No edema; No deformity. Neuro:  Nonfocal  Psych: Normal affect   Labs    Chemistry Recent Labs  Lab 03/24/18 0621  NA 136  K 3.7  CL 101  CO2 27  GLUCOSE 364*  BUN 13  CREATININE 0.55  CALCIUM 8.9  PROT 6.9  ALBUMIN 3.4*  AST 12*  ALT 15  ALKPHOS 102  BILITOT 0.5  GFRNONAA >60  GFRAA >60  ANIONGAP 8     Hematology Recent Labs  Lab 03/24/18 0621  WBC 8.3  RBC 4.23  HGB 14.0  HCT 39.9  MCV 94.3  MCH 33.2  MCHC 35.2  RDW 14.0  PLT 272    Cardiac Enzymes Recent Labs  Lab 03/24/18 0621 03/24/18 0925 03/24/18 1550 03/24/18 2210  TROPONINI <0.03 <0.03 <0.03 <0.03   No results for input(s): TROPIPOC in the last 168 hours.   BNP Recent Labs  Lab 03/24/18 0621  BNP 91.0     DDimer No results for input(s): DDIMER in the last 168 hours.   Radiology    Dg Chest Portable 1 View  Result Date: 03/24/2018 CLINICAL DATA:  Chest pain. EXAM: PORTABLE CHEST 1 VIEW COMPARISON:  Radiographs 02/06/2018, CT 01/26/2018 FINDINGS: The  cardiomediastinal contours are normal. Coronary stent visualized. Subsegmental atelectasis or scarring at the left lung base. Pulmonary vasculature is normal. No consolidation, pleural effusion, or pneumothorax. No acute osseous abnormalities are seen. IMPRESSION: Subsegmental atelectasis or scarring at the left lung base. Otherwise negative radiograph of the chest. Electronically Signed   By: Keith Rake M.D.   On: 03/24/2018 06:50    Cardiac Studies     Patient Profile     51 year old woman with known coronary artery disease, MI July 2019 stent x3, repeat admission subacute stent thrombosis August 2019, ischemic heart myopathy ejection fraction 30 to 35%, long smoking history, COPD, type 2 diabetes, who presents the hospital with neck pain, chest pain, vomiting  Assessment & Plan    A/P: Chest pain Atypical in nature, more consistent with muscular skeletal  disorder Chronic neck pain Cardiac enzymes negative x3, EKG unchanged Echocardiogram with improved ejection fraction 45 to 50%  CAD, recent PCI Continue aspirin, Brilinta, metoprolol No further ischemic work-up needed Echocardiogram reviewed with her in detail, ejection fraction improved  Ischemic cardiomyopathy Previous ejection fraction 30 to 35% July 2019 following MI Ejection fraction now 45 to 50% She can discontinue the LifeVest, this was discussed with her in detail She has follow-up in cardiology clinic next week  Type 2 diabetes Poorly controlled, hemoglobin A1c of 12 Discussed with her in detail, primary care has referred her to endocrinology  Hyperlipidemia Continue high-dose Lipitor for goal LDL less than 70  COPD Congratulated her on smoking cessation July 2019 Stable   Total encounter time more than 25 minutes  Greater than 50% was spent in counseling and coordination of care with the patient    For questions or updates, please contact [facility] Please consult [email or web address] for contact info under        Signed, Ida Rogue, MD  03/25/2018, 3:42 PM home

## 2018-03-27 ENCOUNTER — Telehealth: Payer: Self-pay

## 2018-03-27 NOTE — Telephone Encounter (Signed)
I have made the 1st attempt to contact the patient or family member in charge, in order to follow up from recently being discharged from the hospital. I left a message on voicemail but I will make another attempt at a different time.   Direct call back - [phone] 

## 2018-03-27 NOTE — Telephone Encounter (Signed)
I have made the 2nd attempt to contact the patient or family member in charge, in order to follow up from recently being discharged from the hospital. I left a message on voicemail. 

## 2018-03-29 ENCOUNTER — Ambulatory Visit (INDEPENDENT_AMBULATORY_CARE_PROVIDER_SITE_OTHER): Payer: Medicaid Other | Admitting: Nurse Practitioner

## 2018-03-29 ENCOUNTER — Encounter: Payer: Self-pay | Admitting: Nurse Practitioner

## 2018-03-29 ENCOUNTER — Telehealth: Payer: Self-pay | Admitting: Family Medicine

## 2018-03-29 VITALS — BP 118/66 | Ht 61.0 in | Wt 139.0 lb

## 2018-03-29 DIAGNOSIS — F418 Other specified anxiety disorders: Secondary | ICD-10-CM

## 2018-03-29 DIAGNOSIS — I255 Ischemic cardiomyopathy: Secondary | ICD-10-CM

## 2018-03-29 DIAGNOSIS — I25118 Atherosclerotic heart disease of native coronary artery with other forms of angina pectoris: Secondary | ICD-10-CM | POA: Diagnosis not present

## 2018-03-29 DIAGNOSIS — E785 Hyperlipidemia, unspecified: Secondary | ICD-10-CM | POA: Diagnosis not present

## 2018-03-29 DIAGNOSIS — E119 Type 2 diabetes mellitus without complications: Secondary | ICD-10-CM

## 2018-03-29 DIAGNOSIS — I5042 Chronic combined systolic (congestive) and diastolic (congestive) heart failure: Secondary | ICD-10-CM

## 2018-03-29 DIAGNOSIS — E1140 Type 2 diabetes mellitus with diabetic neuropathy, unspecified: Secondary | ICD-10-CM

## 2018-03-29 DIAGNOSIS — IMO0002 Reserved for concepts with insufficient information to code with codable children: Secondary | ICD-10-CM

## 2018-03-29 DIAGNOSIS — E1165 Type 2 diabetes mellitus with hyperglycemia: Principal | ICD-10-CM

## 2018-03-29 MED ORDER — INSULIN ASPART 100 UNIT/ML ~~LOC~~ SOLN: 14.0000 [IU] | Freq: Three times a day (TID) | SUBCUTANEOUS | 5 refills | Status: DC

## 2018-03-29 MED ORDER — CITALOPRAM HYDROBROMIDE 20 MG PO TABS: 20.0000 mg | ORAL_TABLET | Freq: Every day | ORAL | 1 refills | Status: DC

## 2018-03-29 NOTE — Telephone Encounter (Signed)
Referral placed to [facility] as requested. I was expecting her to notify me about this from our last conversation.  Sent refill Novolog and Celexa.  Nobie Putnam, [facility] 03/29/2018, 6:02 PM

## 2018-03-29 NOTE — Progress Notes (Signed)
Office Visit    Patient Name: Renee Mack Date of Encounter: 03/29/2018  Primary Care Provider:  Olin Hauser, DO Primary Cardiologist:  [name], MD  Chief Complaint    52 year old female with a history of CAD status post recent MI and drug-eluting stent placement to LAD x3 with subsequent readmission for subacute stent thrombosis and repeat PCI x2, combined systolic and diastolic congestive heart failure, ischemic cardiomyopathy with EF of 45 to 50%, former tobacco abuse, COPD, hyperlipidemia, GERD, type 2 diabetes mellitus, and pancreatitis, who presents for follow-up after recent hospitalization secondary to neck and chest pain.  Past Medical History    Past Medical History:  Diagnosis Date  . Cancer ([place])    vulvular  . Cervical disc disease    a. 01/2018 MRI Cervical spine: Cervical spondylosis with multilevel disc and facet degeneration greatest at C5-6 and C6-7.  Moderate to sev R C5-6, mod L C5-6, and mod bilat C6-7 foraminal stenosis with multilevel mild foraminal stenosis.  Mild C5-6 and mod C6-7 canal stenosis. C6-7 central cord impingement w/ cord flattening.  . Chronic combined systolic (congestive) and diastolic (congestive) heart failure ([place])    a. 12/2017 Echo: EF 30-35%, mid-apicalanteroseptal, ant, and apical AK. Gr1 DD. Mild conc LVH; b. 03/2018 Echo: EF 45-50%, antsept, ant HK. Mild MR. Nl LA size. Nl RV fxn.  Marland Kitchen COPD (chronic obstructive pulmonary disease) (HCC)    not on home oxygen  . Coronary artery disease    a. 12/2017 ACS/PCI: LAD 100p (2.25x26 Resolute Onyx DES), 56m (2.0x12 Resolute Onyx DES), 80d (2.0x15 Resolute Onyx DES), RCA 90p (non-dominant). EF 25-35%. Post-MI course complicated by CGS; b. 02/9832 NSTEMI/subacute thrombosis-->LAD 100 (PTCA + DES x 1); c. 01/2018 NSTEMI/PCI: LM min irregs, LAD 50-60p ISR/hazy (3.5x12 Resolute DES), 30m/d (underexpansion of prior stents-->PTCA). EF 45-50%.  Marland Kitchen GERD (gastroesophageal reflux disease)   .  H/O degenerative disc disease   . Ischemic cardiomyopathy    a. 12/2017 Echo: EF 30-35%; b. 03/2018 Echo: EF 45-50%.  . Myocardial infarction ([place])    a. 12/2017-->DES to LAD x 3.  . Pancreatitis   . Shingles   . Spinal stenosis   . Tobacco abuse   . Ulcer (traumatic) of oral mucosa   . Urinary incontinence    Past Surgical History:  Procedure Laterality Date  . ABDOMINAL HYSTERECTOMY     partial  . CORONARY STENT INTERVENTION N/A 12/26/2017   Procedure: CORONARY STENT INTERVENTION;  Surgeon: Wellington Hampshire, MD;  Location: [facility];  Service: Cardiovascular;  Laterality: N/A;  . CORONARY STENT INTERVENTION N/A 02/08/2018   Procedure: CORONARY STENT INTERVENTION;  Surgeon: Nelva Bush, MD;  Location: [facility];  Service: Cardiovascular;  Laterality: N/A;  . CORONARY/GRAFT ACUTE MI REVASCULARIZATION N/A 12/19/2017   Procedure: Coronary/Graft Acute MI Revascularization;  Surgeon: Wellington Hampshire, MD;  Location: [facility];  Service: Cardiovascular;  Laterality: N/A;  . ECTOPIC PREGNANCY SURGERY Left   . INTRAVASCULAR ULTRASOUND/IVUS N/A 02/08/2018   Procedure: Intravascular Ultrasound/IVUS;  Surgeon: Nelva Bush, MD;  Location: [facility];  Service: Cardiovascular;  Laterality: N/A;  . LEFT HEART CATH AND CORONARY ANGIOGRAPHY N/A 12/19/2017   Procedure: LEFT HEART CATH AND CORONARY ANGIOGRAPHY;  Surgeon: Wellington Hampshire, MD;  Location: [facility];  Service: Cardiovascular;  Laterality: N/A;  . LEFT HEART CATH AND CORONARY ANGIOGRAPHY N/A 12/26/2017   Procedure: LEFT HEART CATH AND CORONARY ANGIOGRAPHY;  Surgeon: Wellington Hampshire, MD;  Location: [facility];  Service: Cardiovascular;  Laterality: N/A;  . LEFT HEART CATH AND CORONARY ANGIOGRAPHY N/A 02/08/2018   Procedure: LEFT HEART CATH AND CORONARY ANGIOGRAPHY;  Surgeon: Nelva Bush, MD;  Location: [facility];  Service: Cardiovascular;  Laterality: N/A;  .  LYMPHADENECTOMY    . SPLENECTOMY, PARTIAL    . vulvulasectomy      Allergies  Allergies  Allergen Reactions  . Bee Venom Itching, Shortness Of Breath and Swelling  . Metformin And Related Shortness Of Breath  . Contrast Allergy Premed Pack [Prednisone & Diphenhydramine]     Rash and itching  . Darvon [Propoxyphene] Itching  . Gabapentin Swelling  . Nsaids Other (See Comments)    Ulcers   . Tramadol Hives    History of Present Illness    53 year old female with a history of CAD, ischemic cardiomyopathy, chronic combined congestive heart failure, tobacco abuse, COPD, hyperlipidemia, GERD, type 2 diabetes mellitus, and pancreatitis.  She was admitted to [facility] in early July 2019 with a non-STEMI and underwent diagnostic catheterization revealing severe proximal, mid, and distal LAD disease along with severe disease in a nondominant right coronary artery.  EF is 25 to 35%.  She underwent drug-eluting stent placement to the LAD x3.  Post catheterization course was complicated by cardiogenic shock and hypotension requiring vasopressors.  Follow-up echo showed an EF of 30 to 35%.  She was discharged with a LifeVest in place.  On July 8, she presented back with chest, neck, and back pain associate with nausea, vomiting, and dyspnea.  Repeat catheterization revealed a total occlusion of the proximal to mid LAD with large thrombus burden.  The LAD was successfully treated with PTCA and also drug-eluting stent to the mid LAD.  She required readmission in late July with neck and shoulder pain.  At that time, troponins were normal and medical therapy was recommended.  She was admitted again August 8 with neck pain.  MRI of the cervical spine showed cervical spondylosis with multilevel disc and facet degeneration.  Cervical spinal stenosis was also noted.  She was readmitted August 19 with recurrent chest discomfort.  Diagnostic catheterization revealed recurrent in-stent thrombus in the proximal  portion of the LAD.  This was again successfully treated with drug-eluting stent.  Intravascular ultrasound was performed and found under expansion of mid and distal stents.  These were treated with PTCA.  EF improved by ventriculography to 45-50%.  She was readmitted on October 4 secondary to recurrent neck, shoulder, and chest discomfort which was somewhat reproducible with position changes.  She ruled out.  Echocardiogram was undertaken and showed persistent improvement in LV function with an EF of 45 to 50%.  LifeVest was discontinued prior to discharge.  Since discharge, she has done reasonably well.  She continues to have some neck discomfort that is worse with certain positions.  She has had 2 brief episodes of mild chest discomfort that occurred with exertion, lasted about a minute, and resolve spontaneously.  This is similar to what she is been experiencing since her initial MI and is likely secondary to nondominant RCA disease.  She has not required any nitrates.  She denies dyspnea, palpitations, PND, orthopnea, dizziness, syncope, edema, or early satiety.  She expresses great relief that she no longer needs to wear a LifeVest.  Home Medications    Prior to Admission medications   Medication Sig Start Date End Date Taking? Authorizing Provider  acetaminophen (TYLENOL) 325 MG tablet Take 650 mg  by mouth every 6 (six) hours as needed for mild pain.     [provider]  albuterol (PROAIR HFA) 108 (90 Base) MCG/ACT inhaler Inhale 1 puff into the lungs every 6 (six) hours as needed for wheezing or shortness of breath.     [provider]  aspirin 81 MG tablet Take 81 mg by mouth daily.    [provider]  atorvastatin (LIPITOR) 80 MG tablet Take 1 tablet (80 mg total) by mouth daily at 6 PM. 01/10/18 03/25/19  Theora Gianotti, NP  citalopram (CELEXA) 20 MG tablet Take 1 tablet (20 mg total) by mouth daily. 02/27/18   Karamalegos, Devonne Doughty, DO  digoxin (LANOXIN)  0.125 MG tablet Take 1 tablet (0.125 mg total) by mouth daily. 02/02/18   Theora Gianotti, NP  esomeprazole (NEXIUM) 40 MG capsule Take 40 mg by mouth daily.     [provider]  insulin aspart (NOVOLOG) 100 UNIT/ML injection Inject 14 Units into the skin 3 (three) times daily before meals. Take 10 units PLUS sliding scale 03/25/18   Sudini, Alveta Heimlich, MD  insulin glargine (LANTUS) 100 UNIT/ML injection Inject 0.7 mLs (70 Units total) into the skin at bedtime. 02/27/18   Karamalegos, Devonne Doughty, DO  LORazepam (ATIVAN) 1 MG tablet Take 1 tablet (1 mg total) by mouth 2 (two) times daily as needed for anxiety. 02/27/18   Karamalegos, Devonne Doughty, DO  metoprolol succinate (TOPROL-XL) 25 MG 24 hr tablet Take 1 tablet (25 mg total) by mouth daily. 02/21/18   Theora Gianotti, NP  ondansetron (ZOFRAN) 4 MG tablet Take 1 tablet (4 mg total) by mouth daily as needed. 01/05/18   Schaevitz, Randall An, MD  ranolazine (RANEXA) 500 MG 12 hr tablet Take 1 tablet (500 mg total) by mouth 2 (two) times daily. 03/09/18   Minna Merritts, MD  Brilinta 90 mg twice daily  Review of Systems    2 episodes of mild exertional chest discomfort as outlined above.  She denies chronic neck pain.  She denies palpitations, dyspnea, PND, orthopnea, dizziness, syncope, edema, or early satiety.  All other systems reviewed and are otherwise negative except as noted above.  Physical Exam    VS:  BP 118/66 (BP Location: Left Arm, Patient Position: Sitting, Cuff Size: Normal)   Ht 5\' 1"  (1.549 m)   Wt 139 lb (63 kg)   BMI 26.26 kg/m  , BMI Body mass index is 26.26 kg/m. GEN: Well nourished, well developed, in no acute distress. HEENT: normal. Neck: Supple, no JVD, carotid bruits, or masses. Cardiac: RRR, no murmurs, rubs, or gallops. No clubbing, cyanosis, edema.  Radials/DP/PT 2+ and equal bilaterally.  Respiratory:  Respirations regular and unlabored, clear to auscultation bilaterally. GI: Soft,  nontender, nondistended, BS + x 4. MS: no deformity or atrophy. Skin: warm and dry, no rash. Neuro:  Strength and sensation are intact. Psych: Normal affect.  Accessory Clinical Findings    ECG personally reviewed by me today -sinus bradycardia, 53, left axis deviation, prior anteroseptal infarct, no acute changes.- no acute changes.  Assessment & Plan    1.  Coronary artery disease: Complex history as outlined in the history of present illness with LAD stenting x3 in early July with subsequent repeat drug-eluting stent placement and stent thrombosis about a week later and then again in late August.  She does occasionally experience chest discomfort with exertion and has known residual severe, nondominant RCA disease.  She was recently hospitalized with recurrent positional  neck shoulder and chest pain.  Enzymes were negative.  She continues to have positional neck pain and has had 2 brief episodes of exertional chest discomfort.  She remains on medical therapy including aspirin, statin, beta-blocker, Brilinta, and ranolazine.  We discussed possibly adding long-acting nitrate at some point in the future if she has ongoing exertional symptoms.  Blood pressures still relatively soft at home, trending in the 90s to low 100s.  Currently, she is comfortable with her degree of symptoms and overall has been feeling well.  No medication changes today.  2.  Ischemic cardiomyopathy/chronic combined systolic and diastolic congestive heart failure: Euvolemic on exam.  Recent follow-up echo showed improvement in LV function with an EF of 45 to 50%.  In that setting, LifeVest was discontinued.  She is thrilled by this.  She has not been having any dyspnea or edema.  She she is careful with her salt intake and weighs regularly.  She remains on beta-blocker and digoxin therapy.  Low blood pressures have previously prevented [place] from adding ACE/ARB/arni/mra.  3.  Hyperlipidemia: LDL 60 in September.  Continue high  potency statin therapy.  4.  DMII: She says sugars have been trending better at home.  A1c was 12.1 July.  Insulin management per primary care.  5.  COPD with history of tobacco abuse: She quit smoking following events in July.  No active wheezing.  Encouraged her to remain off of cigarettes.  6.  Disposition: Follow-up in clinic in 1 month or sooner if necessary.   Murray Hodgkins, NP 03/29/2018, 11:36 AM

## 2018-03-29 NOTE — Addendum Note (Signed)
Addended by: Olin Hauser on: 03/29/2018 06:02 PM   Modules accepted: Orders

## 2018-03-29 NOTE — Telephone Encounter (Signed)
Pt.called requesting a referral to  Diabetic Dr.at [place] ......... Pt also requesting refill on Novolog, syringes, pt lso requesting celexa said she had appt with Dr Randel Books on Nov 8th. PT call back # is  [phone]

## 2018-03-29 NOTE — Patient Instructions (Signed)
Medication Instructions:  - Your physician recommends that you continue on your current medications as directed. Please refer to the Current Medication list given to you today.  If you need a refill on your cardiac medications before your next appointment, please call your pharmacy.   Lab work: - none ordered  If you have labs (blood work) drawn today and your tests are completely normal, you will receive your results only by: Marland Kitchen MyChart Message (if you have MyChart) OR . A paper copy in the mail If you have any lab test that is abnormal or we need to change your treatment, we will call you to review the results.  Testing/Procedures: - none ordered  Follow-Up: At [facility], you and your health needs are our priority.  As part of our continuing mission to provide you with exceptional heart care, we have created designated Provider Care Teams.  These Care Teams include your primary Cardiologist (physician) and Advanced Practice Providers (APPs -  Physician Assistants and Nurse Practitioners) who all work together to provide you with the care you need, when you need it. You will need a follow up appointment in 1 month You may see [name], MD or one of the following Advanced Practice Providers on your designated Care Team:   Murray Hodgkins, NP Christell Faith, PA-C . Marrianne Mood, PA-C  Any Other Special Instructions Will Be Listed Below (If Applicable).

## 2018-03-30 NOTE — Discharge Summary (Signed)
[facility] NAME: Renee Mack    MR#:  [id number]  DATE OF BIRTH:  10-19-1965  DATE OF ADMISSION:  03/24/2018 ADMITTING PHYSICIAN: Hillary Bow, MD  DATE OF DISCHARGE: 03/25/2018  3:21 PM  PRIMARY CARE PHYSICIAN: Olin Hauser, DO   ADMISSION DIAGNOSIS:  Atypical chest pain [R07.89]  DISCHARGE DIAGNOSIS:  Active Problems:   Chest pain   SECONDARY DIAGNOSIS:   Past Medical History:  Diagnosis Date  . Cancer ([place])    vulvular  . Cervical disc disease    a. 01/2018 MRI Cervical spine: Cervical spondylosis with multilevel disc and facet degeneration greatest at C5-6 and C6-7.  Moderate to sev R C5-6, mod L C5-6, and mod bilat C6-7 foraminal stenosis with multilevel mild foraminal stenosis.  Mild C5-6 and mod C6-7 canal stenosis. C6-7 central cord impingement w/ cord flattening.  . Chronic combined systolic (congestive) and diastolic (congestive) heart failure ([place])    a. 12/2017 Echo: EF 30-35%, mid-apicalanteroseptal, ant, and apical AK. Gr1 DD. Mild conc LVH; b. 03/2018 Echo: EF 45-50%, antsept, ant HK. Mild MR. Nl LA size. Nl RV fxn.  Marland Kitchen COPD (chronic obstructive pulmonary disease) (HCC)    not on home oxygen  . Coronary artery disease    a. 12/2017 ACS/PCI: LAD 100p (2.25x26 Resolute Onyx DES), 29m (2.0x12 Resolute Onyx DES), 80d (2.0x15 Resolute Onyx DES), RCA 90p (non-dominant). EF 25-35%. Post-MI course complicated by CGS; b. 11/4401 NSTEMI/subacute thrombosis-->LAD 100 (PTCA + DES x 1); c. 01/2018 NSTEMI/PCI: LM min irregs, LAD 50-60p ISR/hazy (3.5x12 Resolute DES), 53m/d (underexpansion of prior stents-->PTCA). EF 45-50%.  Marland Kitchen GERD (gastroesophageal reflux disease)   . H/O degenerative disc disease   . Ischemic cardiomyopathy    a. 12/2017 Echo: EF 30-35%; b. 03/2018 Echo: EF 45-50%.  . Myocardial infarction ([place])    a. 12/2017-->DES to LAD x 3.  . Pancreatitis   . Shingles   . Spinal stenosis   . Tobacco abuse   .  Ulcer (traumatic) of oral mucosa   . Urinary incontinence      ADMITTING HISTORY  HISTORY OF PRESENT ILLNESS:  Renee Mack  is a 52 y.o. female with a known history of chronic systolic CHF with ejection fraction 30 to 35%, CAD with recent catheterization for subacute stent thrombosis of LAD, tobacco use, COPD, diabetes mellitus presents to the emergency room complaining of acute onset of midsternal chest pain earlier today.  Complains of radiation down the left arm.  No aggravating or relieving factors.  Patient is on Ranexa.  She mentions no chest pain since her recent cardiac catheterization.  Here her troponin has been negative.  EKG shows nothing acute.  Due to ongoing chest pain patient is being admitted under observation to rule out acute coronary syndrome.   HOSPITAL COURSE:   *Musculoskeletal neck and chest pain. Patient was admitted to cardiac floor with telemetry monitoring.  Troponin remain negative on repeat troponins.  No arrhythmias on cardiac monitoring.  Cardiology was consulted who did not think this was cardiac in nature.  Patient was continued on her cardiac medications.  Due to recent stent placement she was monitored overnight and after all investigations returned normal patient is being discharged home to follow-up with the pain clinic.  Patient has requested we give her oxycodone prescription which she supposedly had prescribed at [facility] pain clinic.  Reviewed [facility] pain clinic notes and I did not see any oxycodone but recommendations to not use any narcotics.  Patient was not given prescription for narcotics and explained the reason.  Discharged home in stable condition.  CONSULTS OBTAINED:  Treatment Team:  Minna Merritts, MD  DRUG ALLERGIES:   Allergies  Allergen Reactions  . Bee Venom Itching, Shortness Of Breath and Swelling  . Metformin And Related Shortness Of Breath  . Contrast Allergy Premed Pack [Prednisone & Diphenhydramine]     Rash and itching  .  Darvon [Propoxyphene] Itching  . Gabapentin Swelling  . Nsaids Other (See Comments)    Ulcers   . Tramadol Hives    DISCHARGE MEDICATIONS:   Allergies as of 03/25/2018      Reactions   Bee Venom Itching, Shortness Of Breath, Swelling   Metformin And Related Shortness Of Breath   Contrast Allergy Premed Pack [prednisone & Diphenhydramine]    Rash and itching   Darvon [propoxyphene] Itching   Gabapentin Swelling   Nsaids Other (See Comments)   Ulcers   Tramadol Hives      Medication List    STOP taking these medications   insulin aspart 100 UNIT/ML injection Commonly known as:  novoLOG     TAKE these medications   acetaminophen 325 MG tablet Commonly known as:  TYLENOL Take 650 mg by mouth every 6 (six) hours as needed for mild pain.   aspirin 81 MG tablet Take 81 mg by mouth daily.   atorvastatin 80 MG tablet Commonly known as:  LIPITOR Take 1 tablet (80 mg total) by mouth daily at 6 PM.   digoxin 0.125 MG tablet Commonly known as:  LANOXIN Take 1 tablet (0.125 mg total) by mouth daily.   esomeprazole 40 MG capsule Commonly known as:  NEXIUM Take 40 mg by mouth daily.   insulin glargine 100 UNIT/ML injection Commonly known as:  LANTUS Inject 0.7 mLs (70 Units total) into the skin at bedtime.   LORazepam 1 MG tablet Commonly known as:  ATIVAN Take 1 tablet (1 mg total) by mouth 2 (two) times daily as needed for anxiety.   metoprolol succinate 25 MG 24 hr tablet Commonly known as:  TOPROL-XL Take 1 tablet (25 mg total) by mouth daily.   ondansetron 4 MG tablet Commonly known as:  ZOFRAN Take 1 tablet (4 mg total) by mouth daily as needed.   PROAIR HFA 108 (90 Base) MCG/ACT inhaler Generic drug:  albuterol Inhale 1 puff into the lungs every 6 (six) hours as needed for wheezing or shortness of breath.   ranolazine 500 MG 12 hr tablet Commonly known as:  RANEXA Take 1 tablet (500 mg total) by mouth 2 (two) times daily.       Today   VITAL SIGNS:   Blood pressure 98/61, pulse (!) 51, temperature 98 F (36.7 C), temperature source Oral, resp. rate 14, height 5\' 1"  (1.549 m), weight 62.4 kg, SpO2 97 %.  I/O:  No intake or output data in the 24 hours ending 03/30/18 1615  PHYSICAL EXAMINATION:  Physical Exam  GENERAL:  52 y.o.-year-old patient lying in the bed with no acute distress.  LUNGS: Normal breath sounds bilaterally, no wheezing, rales,rhonchi or crepitation. No use of accessory muscles of respiration.  CARDIOVASCULAR: S1, S2 normal. No murmurs, rubs, or gallops.  ABDOMEN: Soft, non-tender, non-distended. Bowel sounds present. No organomegaly or mass.  NEUROLOGIC: Moves all 4 extremities. PSYCHIATRIC: The patient is alert and oriented x 3.  SKIN: No obvious rash, lesion, or ulcer.   DATA REVIEW:   CBC Recent Labs  Lab 03/24/18 [id number]  WBC 8.3  HGB 14.0  HCT 39.9  PLT 272    Chemistries  Recent Labs  Lab 03/24/18 0621  NA 136  K 3.7  CL 101  CO2 27  GLUCOSE 364*  BUN 13  CREATININE 0.55  CALCIUM 8.9  MG 1.7  AST 12*  ALT 15  ALKPHOS 102  BILITOT 0.5    Cardiac Enzymes Recent Labs  Lab 03/24/18 2210  TROPONINI <0.03    Microbiology Results  Results for orders placed or performed during the hospital encounter of 01/05/18  Urine Culture     Status: Abnormal   Collection Time: 01/05/18  1:07 PM  Result Value Ref Range Status   Specimen Description   Final    URINE, RANDOM Performed at [facility], [address], [place], [place] [address]    Special Requests   Final    NONE Performed at [facility], [address] [place], [place] [address]    Culture 60,000 COLONIES/mL STAPHYLOCOCCUS HOMINIS (A)  Final   Report Status 01/08/2018 FINAL  Final   Organism ID, Bacteria STAPHYLOCOCCUS HOMINIS (A)  Final      Susceptibility   Staphylococcus hominis - MIC*    CIPROFLOXACIN >=8 RESISTANT Resistant     GENTAMICIN 1 SENSITIVE Sensitive     NITROFURANTOIN <=16 SENSITIVE  Sensitive     OXACILLIN >=4 RESISTANT Resistant     TETRACYCLINE >=16 RESISTANT Resistant     VANCOMYCIN 1 SENSITIVE Sensitive     TRIMETH/SULFA 40 SENSITIVE Sensitive     CLINDAMYCIN >=8 RESISTANT Resistant     RIFAMPIN <=0.5 SENSITIVE Sensitive     Inducible Clindamycin NEGATIVE Sensitive     * 60,000 COLONIES/mL STAPHYLOCOCCUS HOMINIS    RADIOLOGY:  No results found.  Follow up with PCP in 1 week.  Management plans discussed with the patient, family and they are in agreement.  CODE STATUS:  Code Status History    Date Active Date Inactive Code Status Order ID Comments User Context   03/24/2018 1305 03/25/2018 1821 Full Code [id number]  Hillary Bow, MD ED   02/07/2018 0630 02/09/2018 1532 Full Code [id number]  Arta Silence, MD ED   01/26/2018 0811 01/28/2018 1803 Full Code [id number]  Harrie Foreman, MD Inpatient   01/15/2018 1342 01/17/2018 1617 Full Code [id number]  Demetrios Loll, MD Inpatient   12/26/2017 1538 12/30/2017 1928 Full Code [id number]  Wellington Hampshire, MD Inpatient   12/26/2017 1537 12/26/2017 1538 Full Code [id number]  Vaughan Basta, MD Inpatient   12/19/2017 0839 12/21/2017 1940 Full Code [id number]  Wellington Hampshire, MD Inpatient   11/03/2017 1031 11/04/2017 1850 Full Code [id number]  Fritzi Mandes, MD Inpatient   09/28/2017 1044 09/29/2017 2121 Full Code [id number]  Harrie Foreman, MD ED   07/17/2017 2004 07/20/2017 1840 Full Code [id number]  Henreitta Leber, MD ED   06/12/2017 2227 06/16/2017 1910 Full Code [id number]  Gorden Harms, MD Inpatient   12/28/2014 0305 12/28/2014 1919 Full Code [id number]  Lance Coon, MD Inpatient      TOTAL TIME TAKING CARE OF THIS PATIENT ON DAY OF DISCHARGE: more than 30 minutes.   Leia Alf Aritza Brunet M.D on 03/30/2018 at 4:15 PM  Between 7am to 6pm - Pager - [phone]  After 6pm go to [email or web address] - password [email or web address] [place] [facility]  Office  [phone]  CC: Primary care physician; Olin Hauser,  DO  Note: This dictation was prepared with Dragon dictation along with smaller phrase technology.  Any transcriptional errors that result from this process are unintentional.

## 2018-04-12 ENCOUNTER — Telehealth: Payer: Self-pay | Admitting: Family Medicine

## 2018-04-12 NOTE — Telephone Encounter (Signed)
Pt. Called requesting refill on syringe called into [facility]  [address] Pt call back # is [phone]

## 2018-04-13 NOTE — Telephone Encounter (Signed)
Try calling patient don't know which syringe she is requesting unable to reach patient and her voice box is full.

## 2018-05-03 ENCOUNTER — Ambulatory Visit (INDEPENDENT_AMBULATORY_CARE_PROVIDER_SITE_OTHER): Payer: Medicaid Other | Admitting: Nurse Practitioner

## 2018-05-03 ENCOUNTER — Encounter: Payer: Self-pay | Admitting: Nurse Practitioner

## 2018-05-03 VITALS — BP 110/62 | HR 51 | Ht 61.0 in | Wt 139.0 lb

## 2018-05-03 DIAGNOSIS — E1165 Type 2 diabetes mellitus with hyperglycemia: Secondary | ICD-10-CM | POA: Diagnosis not present

## 2018-05-03 DIAGNOSIS — I2510 Atherosclerotic heart disease of native coronary artery without angina pectoris: Secondary | ICD-10-CM

## 2018-05-03 DIAGNOSIS — E785 Hyperlipidemia, unspecified: Secondary | ICD-10-CM | POA: Diagnosis not present

## 2018-05-03 DIAGNOSIS — I255 Ischemic cardiomyopathy: Secondary | ICD-10-CM | POA: Diagnosis not present

## 2018-05-03 NOTE — Patient Instructions (Addendum)
Medication Instructions:  STOP the Digoxin  If you need a refill on your cardiac medications before your next appointment, please call your pharmacy.   Lab work: None ordered  Testing/Procedures: None ordered  Follow-Up: At [facility], you and your health needs are our priority.  As part of our continuing mission to provide you with exceptional heart care, we have created designated Provider Care Teams.  These Care Teams include your primary Cardiologist (physician) and Advanced Practice Providers (APPs -  Physician Assistants and Nurse Practitioners) who all work together to provide you with the care you need, when you need it. You will need a follow up appointment in 3 months. You may see Dr. Fletcher Anon or one of the following Advanced Practice Providers on your designated Care Team:   Murray Hodgkins, NP Christell Faith, PA-C . Marrianne Mood, PA-C

## 2018-05-03 NOTE — Progress Notes (Signed)
Office Visit    Patient Name: Renee Mack Date of Encounter: 05/03/2018  Primary Care Provider:  Olin Hauser, DO Primary Cardiologist:  [name], MD  Chief Complaint    52 year old female with a history of CAD s/p MI and drug-eluting stent placement to LAD x3 in July, with subsequent readmission for subacute stent thrombosis and repeat PCI x2, combined systolic and diastolic congestive heart failure, ischemic cardiomyopathy with EF of 45 to 50%, former tobacco abuse, COPD, hyperlipidemia, GERD, type 2 diabetes mellitus, and pancreatitis, who presents for follow-up of CAD and ischemic cardiomyopathy.  Past Medical History    Past Medical History:  Diagnosis Date  . Cancer ([place])    vulvular  . Cervical disc disease    a. 01/2018 MRI Cervical spine: Cervical spondylosis with multilevel disc and facet degeneration greatest at C5-6 and C6-7.  Moderate to sev R C5-6, mod L C5-6, and mod bilat C6-7 foraminal stenosis with multilevel mild foraminal stenosis.  Mild C5-6 and mod C6-7 canal stenosis. C6-7 central cord impingement w/ cord flattening.  . Chronic combined systolic (congestive) and diastolic (congestive) heart failure ([place])    a. 12/2017 Echo: EF 30-35%, mid-apicalanteroseptal, ant, and apical AK. Gr1 DD. Mild conc LVH; b. 03/2018 Echo: EF 45-50%, antsept, ant HK. Mild MR. Nl LA size. Nl RV fxn.  Marland Kitchen COPD (chronic obstructive pulmonary disease) (H[facility])    not on home oxygen  . Coronary artery disease    a. 12/2017 ACS/PCI: LAD 100p (2.25x26 Resolute Onyx DES), 70m (2.0x12 Resolute Onyx DES), 80d (2.0x15 Resolute Onyx DES), RCA 90p (non-dominant). EF 25-35%. Post-MI course complicated by CGS; b. 06/624 NSTEMI/subacute thrombosis-->LAD 100 (PTCA + DES x 1); c. 01/2018 NSTEMI/PCI: LM min irregs, LAD 50-60p ISR/hazy (3.5x12 Resolute DES), 11m/d (underexpansion of prior stents-->PTCA). EF 45-50%.  Marland Kitchen GERD (gastroesophageal reflux disease)   . H/O degenerative disc disease     . Ischemic cardiomyopathy    a. 12/2017 Echo: EF 30-35%; b. 03/2018 Echo: EF 45-50%.  . Myocardial infarction ([place])    a. 12/2017-->DES to LAD x 3.  . Pancreatitis   . Shingles   . Spinal stenosis   . Tobacco abuse   . Ulcer (traumatic) of oral mucosa   . Urinary incontinence    Past Surgical History:  Procedure Laterality Date  . ABDOMINAL HYSTERECTOMY     partial  . CORONARY STENT INTERVENTION N/A 12/26/2017   Procedure: CORONARY STENT INTERVENTION;  Surgeon: Wellington Hampshire, MD;  Location: [facility];  Service: Cardiovascular;  Laterality: N/A;  . CORONARY STENT INTERVENTION N/A 02/08/2018   Procedure: CORONARY STENT INTERVENTION;  Surgeon: Nelva Bush, MD;  Location: [facility];  Service: Cardiovascular;  Laterality: N/A;  . CORONARY/GRAFT ACUTE MI REVASCULARIZATION N/A 12/19/2017   Procedure: Coronary/Graft Acute MI Revascularization;  Surgeon: Wellington Hampshire, MD;  Location: [facility];  Service: Cardiovascular;  Laterality: N/A;  . ECTOPIC PREGNANCY SURGERY Left   . INTRAVASCULAR ULTRASOUND/IVUS N/A 02/08/2018   Procedure: Intravascular Ultrasound/IVUS;  Surgeon: Nelva Bush, MD;  Location: [facility];  Service: Cardiovascular;  Laterality: N/A;  . LEFT HEART CATH AND CORONARY ANGIOGRAPHY N/A 12/19/2017   Procedure: LEFT HEART CATH AND CORONARY ANGIOGRAPHY;  Surgeon: Wellington Hampshire, MD;  Location: [facility];  Service: Cardiovascular;  Laterality: N/A;  . LEFT HEART CATH AND CORONARY ANGIOGRAPHY N/A 12/26/2017   Procedure: LEFT HEART CATH AND CORONARY ANGIOGRAPHY;  Surgeon: Wellington Hampshire, MD;  Location: [facility]  INVASIVE [facility] LAB;  Service: Cardiovascular;  Laterality: N/A;  . LEFT HEART CATH AND CORONARY ANGIOGRAPHY N/A 02/08/2018   Procedure: LEFT HEART CATH AND CORONARY ANGIOGRAPHY;  Surgeon: Nelva Bush, MD;  Location: [facility];  Service: Cardiovascular;  Laterality: N/A;  . LYMPHADENECTOMY    .  SPLENECTOMY, PARTIAL    . vulvulasectomy      Allergies  Allergies  Allergen Reactions  . Bee Venom Itching, Shortness Of Breath and Swelling  . Metformin And Related Shortness Of Breath  . Contrast Allergy Premed Pack [Prednisone & Diphenhydramine]     Rash and itching  . Darvon [Propoxyphene] Itching  . Gabapentin Swelling  . Nsaids Other (See Comments)    Ulcers   . Tramadol Hives    History of Present Illness    52 year old female with a history of CAD s/p MI and drug-eluting stent placement to LAD x3 in July, with subsequent readmission for subacute stent thrombosis and repeat PCI x2; combined systolic and diastolic congestive heart failure, ischemic cardiomyopathy with EF of 45 to 50%, former tobacco abuse, COPD, hyperlipidemia, GERD, type 2 diabetes mellitus, and pancreatitis, who presents for follow-up of CAD.   She has had a complicated few months following initial STEMI the first of July for which she underwent cardiac catheterization that showed LAD 100p, 89m, 80d, RCA 90p (non-dominant). She required three DES to the LAD. Her recovery was complicated by cardiogenic shock and hypotension requiring vasopressors. Following initial MI, EF was reduced to 30-35% and was provided a LifeVest at discharge. This was followed by two separate admissions for chest, neck, arm, and back pain with associated n/v. She underwent a cardiac cath, once in July and again in August. Each requiring PTCA and stent placement related to ISR of the LAD.  Upon most recent cath/PCI, under-expansion of prior LAD stents was noted.  Repeat echo showed continued improvement, with latest echo showing an EF of 45-50% in October. Her LifeVest was able to be discontinued at that time. Since she has not had any further complications from her stent placements. She has had issues with neck, arm and back pain related to cervical disc disease that was found on MRI in August and was admitted again in October for the same  pain. Her cardiac work up was negative at that time, her pain was reproducible with movement and attributed to cervical disc disease.  Since then she reports doing very well. She denies chest pain, palpitations, dyspnea, pnd, orthopnea, n, v, dizziness, syncope, weight gain, or early satiety. She does report occasional dependent edema that resolves with elevating her legs at rest.  She has been tolerating activity more without chest pain or SOB. She states she's finally able to clean her house without having to take breaks to catch her breath. She has been trying to follow a better diet, reports limiting salt intake and is cooking more meals at home. She is also seeing endocrinology to get better control of her Diabetes, her last A1c was 11.9. She has also continued to abstain from smoking. She is compliant with all her medications and denies side effects.  She has continued to have mouth/tooth pain related to abscesses and is pending tooth extraction x 2.  She understands that she must remain on uninterrupted DAPT.  Home Medications    Prior to Admission medications   Medication Sig Start Date End Date Taking? Authorizing Provider  acetaminophen (TYLENOL) 325 MG tablet Take 650 mg by mouth every 6 (six) hours as  needed for mild pain.     [provider]  albuterol (PROAIR HFA) 108 (90 Base) MCG/ACT inhaler Inhale 1 puff into the lungs every 6 (six) hours as needed for wheezing or shortness of breath.     [provider]  aspirin 81 MG tablet Take 81 mg by mouth daily.    [provider]  atorvastatin (LIPITOR) 80 MG tablet Take 1 tablet (80 mg total) by mouth daily at 6 PM. 01/10/18 03/25/19  Theora Gianotti, NP  citalopram (CELEXA) 20 MG tablet Take 1 tablet (20 mg total) by mouth daily. 03/29/18   Karamalegos, Devonne Doughty, DO  digoxin (LANOXIN) 0.125 MG tablet Take 1 tablet (0.125 mg total) by mouth daily. 02/02/18   Theora Gianotti, NP  esomeprazole  (NEXIUM) 40 MG capsule Take 40 mg by mouth daily.     [provider]  insulin aspart (NOVOLOG) 100 UNIT/ML injection Inject 14 Units into the skin 3 (three) times daily before meals. Take 10 units PLUS sliding scale 03/29/18   Karamalegos, Alexander J, DO  insulin glargine (LANTUS) 100 UNIT/ML injection Inject 0.7 mLs (70 Units total) into the skin at bedtime. 02/27/18   Karamalegos, Devonne Doughty, DO  LORazepam (ATIVAN) 1 MG tablet Take 1 tablet (1 mg total) by mouth 2 (two) times daily as needed for anxiety. 02/27/18   Karamalegos, Devonne Doughty, DO  metoprolol succinate (TOPROL-XL) 25 MG 24 hr tablet Take 1 tablet (25 mg total) by mouth daily. 02/21/18   Theora Gianotti, NP  ondansetron (ZOFRAN) 4 MG tablet Take 1 tablet (4 mg total) by mouth daily as needed. 01/05/18   Schaevitz, Randall An, MD  ranolazine (RANEXA) 500 MG 12 hr tablet Take 1 tablet (500 mg total) by mouth 2 (two) times daily. 03/09/18   Minna Merritts, MD  ticagrelor (BRILINTA) 90 MG TABS tablet Take 90 mg by mouth 2 (two) times daily.    [provider]    Review of Systems    She denies chest pain, palpitations, dyspnea, pnd, orthopnea, n, v, dizziness, syncope, weight gain, or early satiety. She does report occasional dependent edema that resolves with elevating her legs at rest. All other systems reviewed and are otherwise negative except as noted above.  Physical Exam    VS:  BP 110/62 (BP Location: Left Arm, Patient Position: Sitting, Cuff Size: Normal)   Pulse (!) 51   Ht 5\' 1"  (1.549 m)   Wt 139 lb (63 kg)   BMI 26.26 kg/m  , BMI Body mass index is 26.26 kg/m. GEN: Well nourished, well developed, in no acute distress. HEENT: normal. Neck: Supple, no JVD, carotid bruits, or masses. Cardiac: RRR, no murmurs, rubs, or gallops. No clubbing, cyanosis, edema.  Radials/DP/PT 2+ and equal bilaterally.  Respiratory:  Respirations regular and unlabored, clear to auscultation bilaterally. GI: Soft,  nontender, nondistended, BS + x 4. MS: no deformity or atrophy. Skin: warm and dry, no rash. Neuro:  Strength and sensation are intact. Psych: Normal affect.  Accessory Clinical Findings    ECG personally reviewed by me today - No EKG performed this visit per patient request.  Assessment & Plan    1.  CAD s/p STEMI with subsequent ISR x2: Had recent MI in July with three stents placed in the LAD, after which she had two separate episodes of in stent thrombosis (July and August), despite continuous DAPT,  that required repeat PTCA and stent placement each time. Her RCA was also found to  be chronically occluded in July and has been medically managed. Currently stable. She denies recent chest pain or SOB. Has been tolerating activity without complication. She remains on DAPT with ASA and Brillinta, beta blocker, statin, and Ranexa. Will continue current regimen. If chest pain reoccurs and BP allows, may consider adding long acting nitrate. She is planning on having a dental procedure to remove two teeth in the near future and has been instructed to stay on ASA and Brillinta throughout the peri-procedural period.    2. HLD: LDL currently 60 in September. Will continue high dose statin therapy.   3. Ischemic Cardiomyopathy: EF initially reduced after STEMI in July to 30-35%. She wore a LifeVest and on repeat echo in October EF had improved to 45-50%. Her LifeVest has since been removed. She is doing well since. She denies chest pain, SOB or edema. Since EF has improved, will stop digoxin today, will continue beta blocker. BP still runs soft, thus precluding use of ARB/acei.  4. Poorly controlled Type II Diabetes: Continues to have elevated A1c, reports her blood sugar is still running in the 3-400's despite use of long and short acting insulin therapy and diet changes. She is now being followed by endocrinology for better control.   5. Cervical Disc Disease: She reports pain is better controlled but  is planning to be seen by spinal specialist to see if any interventions need to be done to correct her condition.   6. Tooth Abscess:  Pending extractions x 2.  As above, in the setting of recent MI's and multiple PCI's with stent thrombosis, she is to stay on dual antiplatelet therapy throughout the peri-procedural period.  She says that she gets very nervous with these types of procedures and feels that she may need some form or either oral or IV sedation, which is acceptable from our standpoint.  7.  Disposition: Will follow up in 3 months or sooner if needed.    Murray Hodgkins, NP 05/03/2018, 3:08 PM

## 2018-05-11 ENCOUNTER — Telehealth: Payer: Self-pay | Admitting: Family Medicine

## 2018-05-11 DIAGNOSIS — N903 Dysplasia of vulva, unspecified: Secondary | ICD-10-CM

## 2018-05-11 NOTE — Telephone Encounter (Signed)
Pt has been going to [facility] ob/gyn oncology but asked for a referral to one at [facility].  Her call back number is [phone]

## 2018-05-11 NOTE — Telephone Encounter (Signed)
Patient has a history of Vulvar Intraepithelial Neoplasia Stage 3. It was initially diagnosed in 2005. She has had surgical resection. She had recurrence in 2013 and 2018 most recently. She was last seen by Dr [name] - GYN Oncology at [facility].  I am not sure if [facility] has a provider who specializes in [place].  Could you or Nikki call [place] [facility] to ask if they have this particular specialist and if they would be able to see this patient locally? They would need her records from [facility]. Most are available in [place].  Let me know and will sign off on or place referral order.  Nobie Putnam, DO [facility] 05/11/2018, 5:49 PM

## 2018-05-12 NOTE — Telephone Encounter (Signed)
Renee Mack will call me back with referral suggestion.

## 2018-05-12 NOTE — Telephone Encounter (Signed)
Referral placed make sure it's correct.

## 2018-05-12 NOTE — Addendum Note (Signed)
Addended by: Frederich Cha D on: 05/12/2018 09:55 AM   Modules accepted: Orders

## 2018-05-22 ENCOUNTER — Emergency Department: Payer: Medicaid Other

## 2018-05-22 ENCOUNTER — Encounter
Admission: EM | Disposition: A | Discharge status: Home / Self Care | Payer: Self-pay | Source: Home / Self Care | Attending: Internal Medicine

## 2018-05-22 ENCOUNTER — Observation Stay
Admission: EM | Admit: 2018-05-22 | Discharge: 2018-05-23 | Disposition: A | Discharge status: Home / Self Care | Payer: Medicaid Other | Attending: Internal Medicine | Admitting: Internal Medicine

## 2018-05-22 ENCOUNTER — Encounter: Payer: Self-pay | Admitting: Emergency Medicine

## 2018-05-22 ENCOUNTER — Other Ambulatory Visit: Payer: Self-pay

## 2018-05-22 DIAGNOSIS — Z886 Allergy status to analgesic agent status: Secondary | ICD-10-CM | POA: Insufficient documentation

## 2018-05-22 DIAGNOSIS — Z87891 Personal history of nicotine dependence: Secondary | ICD-10-CM | POA: Insufficient documentation

## 2018-05-22 DIAGNOSIS — Z955 Presence of coronary angioplasty implant and graft: Secondary | ICD-10-CM | POA: Diagnosis not present

## 2018-05-22 DIAGNOSIS — I25110 Atherosclerotic heart disease of native coronary artery with unstable angina pectoris: Principal | ICD-10-CM | POA: Insufficient documentation

## 2018-05-22 DIAGNOSIS — I200 Unstable angina: Secondary | ICD-10-CM

## 2018-05-22 DIAGNOSIS — Z79899 Other long term (current) drug therapy: Secondary | ICD-10-CM | POA: Diagnosis not present

## 2018-05-22 DIAGNOSIS — Z8544 Personal history of malignant neoplasm of other female genital organs: Secondary | ICD-10-CM | POA: Insufficient documentation

## 2018-05-22 DIAGNOSIS — K219 Gastro-esophageal reflux disease without esophagitis: Secondary | ICD-10-CM | POA: Insufficient documentation

## 2018-05-22 DIAGNOSIS — R739 Hyperglycemia, unspecified: Secondary | ICD-10-CM

## 2018-05-22 DIAGNOSIS — R079 Chest pain, unspecified: Secondary | ICD-10-CM | POA: Diagnosis present

## 2018-05-22 DIAGNOSIS — Z885 Allergy status to narcotic agent status: Secondary | ICD-10-CM | POA: Insufficient documentation

## 2018-05-22 DIAGNOSIS — E1165 Type 2 diabetes mellitus with hyperglycemia: Secondary | ICD-10-CM | POA: Insufficient documentation

## 2018-05-22 DIAGNOSIS — I959 Hypotension, unspecified: Secondary | ICD-10-CM | POA: Diagnosis not present

## 2018-05-22 DIAGNOSIS — Z888 Allergy status to other drugs, medicaments and biological substances status: Secondary | ICD-10-CM | POA: Diagnosis not present

## 2018-05-22 DIAGNOSIS — Z794 Long term (current) use of insulin: Secondary | ICD-10-CM | POA: Insufficient documentation

## 2018-05-22 DIAGNOSIS — Z7982 Long term (current) use of aspirin: Secondary | ICD-10-CM | POA: Insufficient documentation

## 2018-05-22 DIAGNOSIS — E119 Type 2 diabetes mellitus without complications: Secondary | ICD-10-CM | POA: Insufficient documentation

## 2018-05-22 DIAGNOSIS — E1140 Type 2 diabetes mellitus with diabetic neuropathy, unspecified: Secondary | ICD-10-CM | POA: Insufficient documentation

## 2018-05-22 DIAGNOSIS — J449 Chronic obstructive pulmonary disease, unspecified: Secondary | ICD-10-CM | POA: Diagnosis not present

## 2018-05-22 DIAGNOSIS — I255 Ischemic cardiomyopathy: Secondary | ICD-10-CM | POA: Insufficient documentation

## 2018-05-22 DIAGNOSIS — I5042 Chronic combined systolic (congestive) and diastolic (congestive) heart failure: Secondary | ICD-10-CM | POA: Insufficient documentation

## 2018-05-22 DIAGNOSIS — I252 Old myocardial infarction: Secondary | ICD-10-CM | POA: Insufficient documentation

## 2018-05-22 DIAGNOSIS — E785 Hyperlipidemia, unspecified: Secondary | ICD-10-CM | POA: Diagnosis not present

## 2018-05-22 DIAGNOSIS — Z7902 Long term (current) use of antithrombotics/antiplatelets: Secondary | ICD-10-CM | POA: Insufficient documentation

## 2018-05-22 HISTORY — PX: CORONARY BALLOON ANGIOPLASTY: CATH118233

## 2018-05-22 HISTORY — DX: Hypotension, unspecified: I95.9

## 2018-05-22 HISTORY — PX: LEFT HEART CATH AND CORONARY ANGIOGRAPHY: CATH118249

## 2018-05-22 LAB — COMPREHENSIVE METABOLIC PANEL
ALBUMIN: 3.6 g/dL (ref 3.5–5.0)
ALT: 16 U/L (ref 0–44)
ANION GAP: 11 (ref 5–15)
AST: 11 U/L — AB (ref 15–41)
Alkaline Phosphatase: 79 U/L (ref 38–126)
BILIRUBIN TOTAL: 0.7 mg/dL (ref 0.3–1.2)
BUN: 17 mg/dL (ref 6–20)
CALCIUM: 9.4 mg/dL (ref 8.9–10.3)
CHLORIDE: 97 mmol/L — AB (ref 98–111)
CO2: 24 mmol/L (ref 22–32)
Creatinine, Ser: 0.59 mg/dL (ref 0.44–1.00)
GFR calc Af Amer: >60 mL/min (ref >60)
GFR calc non Af Amer: >60 mL/min (ref >60)
GLUCOSE: 532 mg/dL — AB (ref 70–99)
POTASSIUM: 4.1 mmol/L (ref 3.5–5.1)
Sodium: 132 mmol/L — ABNORMAL LOW (ref 135–145)
Total Protein: 6.9 g/dL (ref 6.5–8.1)

## 2018-05-22 LAB — GLUCOSE, CAPILLARY
Glucose-Capillary: 171 mg/dL — ABNORMAL HIGH (ref 70–99)
Glucose-Capillary: 330 mg/dL — ABNORMAL HIGH (ref 70–99)
Glucose-Capillary: 370 mg/dL — ABNORMAL HIGH (ref 70–99)
Glucose-Capillary: 400 mg/dL — ABNORMAL HIGH (ref 70–99)
Glucose-Capillary: 414 mg/dL — ABNORMAL HIGH (ref 70–99)
Glucose-Capillary: 440 mg/dL — ABNORMAL HIGH (ref 70–99)
Glucose-Capillary: 530 mg/dL (ref 70–99)

## 2018-05-22 LAB — CBC WITH DIFFERENTIAL/PLATELET
Abs Immature Granulocytes: 0.06 10*3/uL (ref 0.00–0.07)
BASOS ABS: 0.1 10*3/uL (ref 0.0–0.1)
BASOS PCT: 0 %
EOS ABS: 0.2 10*3/uL (ref 0.0–0.5)
EOS PCT: 2 %
HEMATOCRIT: 41.3 % (ref 36.0–46.0)
Hemoglobin: 14.1 g/dL (ref 12.0–15.0)
IMMATURE GRANULOCYTES: 0 %
LYMPHS ABS: 2.9 10*3/uL (ref 0.7–4.0)
Lymphocytes Relative: 21 %
MCH: 31.1 pg (ref 26.0–34.0)
MCHC: 34.1 g/dL (ref 30.0–36.0)
MCV: 91.2 fL (ref 80.0–100.0)
Monocytes Absolute: 0.5 10*3/uL (ref 0.1–1.0)
Monocytes Relative: 4 %
NEUTROS PCT: 73 %
NRBC: 0 % (ref 0.0–0.2)
Neutro Abs: 10.2 10*3/uL — ABNORMAL HIGH (ref 1.7–7.7)
PLATELETS: 235 10*3/uL (ref 150–400)
RBC: 4.53 MIL/uL (ref 3.87–5.11)
RDW: 12.8 % (ref 11.5–15.5)
WBC: 14 10*3/uL — AB (ref 4.0–10.5)

## 2018-05-22 LAB — APTT: aPTT: 25 seconds (ref 24–36)

## 2018-05-22 LAB — URINALYSIS, COMPLETE (UACMP) WITH MICROSCOPIC
Bacteria, UA: NONE SEEN
Bilirubin Urine: NEGATIVE
Glucose, UA: >=500 mg/dL — AB
Hgb urine dipstick: NEGATIVE
KETONES UR: 5 mg/dL — AB
LEUKOCYTES UA: NEGATIVE
Nitrite: NEGATIVE
PH: 6 (ref 5.0–8.0)
Protein, ur: NEGATIVE mg/dL
Specific Gravity, Urine: 1.027 (ref 1.005–1.030)

## 2018-05-22 LAB — PROTIME-INR
INR: 0.84
Prothrombin Time: 11.4 seconds (ref 11.4–15.2)

## 2018-05-22 LAB — TROPONIN I
Troponin I: <0.03 ng/mL (ref <0.03)
Troponin I: <0.03 ng/mL (ref <0.03)

## 2018-05-22 LAB — HEMOGLOBIN A1C
Hgb A1c MFr Bld: 11.9 % — ABNORMAL HIGH (ref 4.8–5.6)
Mean Plasma Glucose: 294.83 mg/dL

## 2018-05-22 LAB — TSH: TSH: 1.447 u[IU]/mL (ref 0.350–4.500)

## 2018-05-22 LAB — POCT ACTIVATED CLOTTING TIME: Activated Clotting Time: 296 seconds

## 2018-05-22 LAB — LIPASE, BLOOD: Lipase: 17 U/L (ref 11–51)

## 2018-05-22 SURGERY — LEFT HEART CATH AND CORONARY ANGIOGRAPHY
Anesthesia: Moderate Sedation

## 2018-05-22 MED ORDER — HEPARIN (PORCINE) IN NACL 1000-0.9 UT/500ML-% IV SOLN
INTRAVENOUS | Status: AC
  Filled 2018-05-22: qty 1000

## 2018-05-22 MED ORDER — VERAPAMIL HCL 2.5 MG/ML IV SOLN
INTRAVENOUS | Status: AC
  Filled 2018-05-22: qty 2

## 2018-05-22 MED ORDER — ONDANSETRON HCL 4 MG/2ML IJ SOLN
4.0000 mg | Freq: Once | INTRAMUSCULAR | Status: AC
  Administered 2018-05-22: 4 mg via INTRAVENOUS
  Filled 2018-05-22: qty 2

## 2018-05-22 MED ORDER — TICAGRELOR 90 MG PO TABS
ORAL_TABLET | ORAL | Status: AC
  Filled 2018-05-22: qty 1

## 2018-05-22 MED ORDER — FAMOTIDINE 20 MG PO TABS
40.0000 mg | ORAL_TABLET | ORAL | Status: AC
  Administered 2018-05-22: 40 mg via ORAL

## 2018-05-22 MED ORDER — INSULIN GLARGINE 100 UNIT/ML ~~LOC~~ SOLN
42.0000 [IU] | Freq: Every day | SUBCUTANEOUS | Status: DC
  Administered 2018-05-23: 42 [IU] via SUBCUTANEOUS
  Filled 2018-05-22 (×4): qty 0.42

## 2018-05-22 MED ORDER — SODIUM CHLORIDE 0.9 % WEIGHT BASED INFUSION
1.0000 mL/kg/h | INTRAVENOUS | Status: AC
  Administered 2018-05-22: 1 mL/kg/h via INTRAVENOUS

## 2018-05-22 MED ORDER — ENOXAPARIN SODIUM 40 MG/0.4ML ~~LOC~~ SOLN
40.0000 mg | SUBCUTANEOUS | Status: DC
  Administered 2018-05-22 – 2018-05-23 (×2): 40 mg via SUBCUTANEOUS
  Filled 2018-05-22 (×2): qty 0.4

## 2018-05-22 MED ORDER — METOPROLOL SUCCINATE ER 25 MG PO TB24
25.0000 mg | ORAL_TABLET | Freq: Every day | ORAL | Status: DC
  Administered 2018-05-23: 25 mg via ORAL
  Filled 2018-05-22: qty 1

## 2018-05-22 MED ORDER — FENTANYL CITRATE (PF) 100 MCG/2ML IJ SOLN
50.0000 ug | Freq: Once | INTRAMUSCULAR | Status: AC
  Administered 2018-05-22: 50 ug via INTRAVENOUS
  Filled 2018-05-22: qty 2

## 2018-05-22 MED ORDER — DOCUSATE SODIUM 100 MG PO CAPS
100.0000 mg | ORAL_CAPSULE | Freq: Two times a day (BID) | ORAL | Status: DC
  Administered 2018-05-22: 100 mg via ORAL
  Filled 2018-05-22 (×2): qty 1

## 2018-05-22 MED ORDER — ONDANSETRON HCL 4 MG/2ML IJ SOLN
4.0000 mg | Freq: Four times a day (QID) | INTRAMUSCULAR | Status: DC | PRN
  Administered 2018-05-22 – 2018-05-23 (×2): 4 mg via INTRAVENOUS
  Filled 2018-05-22 (×2): qty 2

## 2018-05-22 MED ORDER — MORPHINE SULFATE (PF) 2 MG/ML IV SOLN
INTRAVENOUS | Status: AC
  Administered 2018-05-22: 2 mg via INTRAVENOUS
  Filled 2018-05-22: qty 1

## 2018-05-22 MED ORDER — OXYCODONE-ACETAMINOPHEN 5-325 MG PO TABS
2.0000 | ORAL_TABLET | ORAL | Status: DC | PRN
  Administered 2018-05-22 – 2018-05-23 (×6): 2 via ORAL
  Filled 2018-05-22 (×6): qty 2

## 2018-05-22 MED ORDER — ALBUTEROL SULFATE (2.5 MG/3ML) 0.083% IN NEBU: 2.5000 mg | INHALATION_SOLUTION | RESPIRATORY_TRACT | Status: DC | PRN

## 2018-05-22 MED ORDER — SODIUM CHLORIDE 0.9 % IV SOLN: 250.0000 mL | INTRAVENOUS | Status: DC | PRN

## 2018-05-22 MED ORDER — ATORVASTATIN CALCIUM 20 MG PO TABS
80.0000 mg | ORAL_TABLET | Freq: Every day | ORAL | Status: DC
  Administered 2018-05-22: 80 mg via ORAL
  Filled 2018-05-22 (×2): qty 4

## 2018-05-22 MED ORDER — VERAPAMIL HCL 2.5 MG/ML IV SOLN
INTRAVENOUS | Status: DC | PRN
  Administered 2018-05-22: 2.5 mg via INTRAVENOUS

## 2018-05-22 MED ORDER — LORAZEPAM 1 MG PO TABS
1.0000 mg | ORAL_TABLET | Freq: Two times a day (BID) | ORAL | Status: DC | PRN
  Administered 2018-05-22 – 2018-05-23 (×3): 1 mg via ORAL
  Filled 2018-05-22 (×3): qty 1

## 2018-05-22 MED ORDER — ACETAMINOPHEN 650 MG RE SUPP: 650.0000 mg | Freq: Four times a day (QID) | RECTAL | Status: DC | PRN

## 2018-05-22 MED ORDER — SODIUM CHLORIDE 0.9% FLUSH: 3.0000 mL | INTRAVENOUS | Status: DC | PRN

## 2018-05-22 MED ORDER — SODIUM CHLORIDE 0.9% FLUSH
3.0000 mL | Freq: Two times a day (BID) | INTRAVENOUS | Status: DC
  Administered 2018-05-22 – 2018-05-23 (×2): 3 mL via INTRAVENOUS

## 2018-05-22 MED ORDER — DIPHENHYDRAMINE HCL 50 MG/ML IJ SOLN
INTRAMUSCULAR | Status: AC
  Filled 2018-05-22: qty 1

## 2018-05-22 MED ORDER — INSULIN ASPART 100 UNIT/ML ~~LOC~~ SOLN
0.0000 [IU] | Freq: Three times a day (TID) | SUBCUTANEOUS | Status: DC
  Administered 2018-05-22 – 2018-05-23 (×2): 15 [IU] via SUBCUTANEOUS
  Administered 2018-05-23: 11 [IU] via SUBCUTANEOUS
  Filled 2018-05-22 (×5): qty 1

## 2018-05-22 MED ORDER — INSULIN ASPART 100 UNIT/ML ~~LOC~~ SOLN
20.0000 [IU] | Freq: Once | SUBCUTANEOUS | Status: AC
  Administered 2018-05-22: 20 [IU] via SUBCUTANEOUS
  Filled 2018-05-22: qty 1

## 2018-05-22 MED ORDER — ONDANSETRON HCL 4 MG PO TABS: 4.0000 mg | ORAL_TABLET | Freq: Four times a day (QID) | ORAL | Status: DC | PRN

## 2018-05-22 MED ORDER — SODIUM CHLORIDE 0.9% FLUSH: 3.0000 mL | Freq: Two times a day (BID) | INTRAVENOUS | Status: DC

## 2018-05-22 MED ORDER — TICAGRELOR 90 MG PO TABS
90.0000 mg | ORAL_TABLET | Freq: Two times a day (BID) | ORAL | Status: DC
  Administered 2018-05-22 – 2018-05-23 (×2): 90 mg via ORAL
  Filled 2018-05-22 (×2): qty 1

## 2018-05-22 MED ORDER — MORPHINE SULFATE (PF) 2 MG/ML IV SOLN
2.0000 mg | Freq: Once | INTRAVENOUS | Status: AC
  Administered 2018-05-22: 2 mg via INTRAVENOUS

## 2018-05-22 MED ORDER — HEPARIN SODIUM (PORCINE) 1000 UNIT/ML IJ SOLN
INTRAMUSCULAR | Status: AC
  Filled 2018-05-22: qty 1

## 2018-05-22 MED ORDER — ASPIRIN 81 MG PO CHEW
81.0000 mg | CHEWABLE_TABLET | Freq: Every day | ORAL | Status: DC
  Administered 2018-05-23: 81 mg via ORAL
  Filled 2018-05-22: qty 1

## 2018-05-22 MED ORDER — INSULIN ASPART 100 UNIT/ML ~~LOC~~ SOLN
0.0000 [IU] | Freq: Every day | SUBCUTANEOUS | Status: AC
  Administered 2018-05-22: 5 [IU] via SUBCUTANEOUS
  Filled 2018-05-22: qty 1

## 2018-05-22 MED ORDER — DIPHENHYDRAMINE HCL 50 MG/ML IJ SOLN
25.0000 mg | Freq: Once | INTRAMUSCULAR | Status: AC
  Administered 2018-05-22: 25 mg via INTRAVENOUS

## 2018-05-22 MED ORDER — CYCLOBENZAPRINE HCL 10 MG PO TABS
10.0000 mg | ORAL_TABLET | Freq: Two times a day (BID) | ORAL | Status: DC
  Administered 2018-05-22 – 2018-05-23 (×3): 10 mg via ORAL
  Filled 2018-05-22 (×4): qty 1

## 2018-05-22 MED ORDER — RANOLAZINE ER 500 MG PO TB12
500.0000 mg | ORAL_TABLET | Freq: Two times a day (BID) | ORAL | Status: DC
  Administered 2018-05-22 – 2018-05-23 (×2): 500 mg via ORAL
  Filled 2018-05-22 (×3): qty 1

## 2018-05-22 MED ORDER — ACETAMINOPHEN 325 MG PO TABS: 650.0000 mg | ORAL_TABLET | Freq: Four times a day (QID) | ORAL | Status: DC | PRN

## 2018-05-22 MED ORDER — SODIUM CHLORIDE 0.9 % IV SOLN
INTRAVENOUS | Status: DC
  Administered 2018-05-22: 10:00:00 via INTRAVENOUS

## 2018-05-22 MED ORDER — FENTANYL CITRATE (PF) 100 MCG/2ML IJ SOLN
INTRAMUSCULAR | Status: AC
  Filled 2018-05-22: qty 2

## 2018-05-22 MED ORDER — HEPARIN SODIUM (PORCINE) 1000 UNIT/ML IJ SOLN
INTRAMUSCULAR | Status: DC | PRN
  Administered 2018-05-22: 3000 [IU] via INTRAVENOUS
  Administered 2018-05-22: 4000 [IU] via INTRAVENOUS

## 2018-05-22 MED ORDER — FAMOTIDINE 20 MG PO TABS
ORAL_TABLET | ORAL | Status: AC
  Filled 2018-05-22: qty 2

## 2018-05-22 MED ORDER — MIDAZOLAM HCL 2 MG/2ML IJ SOLN
INTRAMUSCULAR | Status: AC
  Filled 2018-05-22: qty 2

## 2018-05-22 MED ORDER — METHYLPREDNISOLONE SODIUM SUCC 125 MG IJ SOLR
INTRAMUSCULAR | Status: AC
  Filled 2018-05-22: qty 2

## 2018-05-22 MED ORDER — METHYLPREDNISOLONE SODIUM SUCC 125 MG IJ SOLR
125.0000 mg | INTRAMUSCULAR | Status: AC
  Administered 2018-05-22: 125 mg via INTRAVENOUS

## 2018-05-22 MED ORDER — PANTOPRAZOLE SODIUM 40 MG PO TBEC
80.0000 mg | DELAYED_RELEASE_TABLET | Freq: Every day | ORAL | Status: DC
  Administered 2018-05-23: 80 mg via ORAL
  Filled 2018-05-22: qty 2

## 2018-05-22 MED ORDER — INSULIN ASPART 100 UNIT/ML ~~LOC~~ SOLN
8.0000 [IU] | Freq: Once | SUBCUTANEOUS | Status: AC
  Administered 2018-05-22: 8 [IU] via INTRAVENOUS
  Filled 2018-05-22: qty 1

## 2018-05-22 MED ORDER — CITALOPRAM HYDROBROMIDE 20 MG PO TABS
20.0000 mg | ORAL_TABLET | Freq: Every day | ORAL | Status: DC
  Administered 2018-05-23: 20 mg via ORAL
  Filled 2018-05-22: qty 1

## 2018-05-22 MED ORDER — IOPAMIDOL (ISOVUE-300) INJECTION 61%
INTRAVENOUS | Status: DC | PRN
  Administered 2018-05-22: 100 mL via INTRAVENOUS

## 2018-05-22 MED ORDER — MIDAZOLAM HCL 2 MG/2ML IJ SOLN
INTRAMUSCULAR | Status: DC | PRN
  Administered 2018-05-22: 1 mg via INTRAVENOUS

## 2018-05-22 MED ORDER — FENTANYL CITRATE (PF) 100 MCG/2ML IJ SOLN
INTRAMUSCULAR | Status: DC | PRN
  Administered 2018-05-22: 25 ug via INTRAVENOUS

## 2018-05-22 MED ORDER — TICAGRELOR 90 MG PO TABS
ORAL_TABLET | ORAL | Status: DC | PRN
  Administered 2018-05-22: 90 mg via ORAL

## 2018-05-22 MED ORDER — FENTANYL CITRATE (PF) 100 MCG/2ML IJ SOLN
50.0000 ug | Freq: Once | INTRAMUSCULAR | Status: AC
  Administered 2018-05-22: 50 ug via INTRAVENOUS

## 2018-05-22 MED ORDER — OXYCODONE-ACETAMINOPHEN 5-325 MG PO TABS
ORAL_TABLET | ORAL | Status: AC
  Filled 2018-05-22: qty 2

## 2018-05-22 MED ORDER — OXYCODONE-ACETAMINOPHEN 5-325 MG PO TABS
2.0000 | ORAL_TABLET | Freq: Four times a day (QID) | ORAL | Status: DC | PRN
  Administered 2018-05-22: 2 via ORAL

## 2018-05-22 SURGICAL SUPPLY — 11 items
BALLN ~~LOC~~ EUPHORA RX 3.0X15 (BALLOONS) ×3
BALLOON ~~LOC~~ EUPHORA RX 3.0X15 (BALLOONS) ×1 IMPLANT
CATH INFINITI 5FR JK (CATHETERS) ×3 IMPLANT
CATH LAUNCHER 6FR EBU3.5 (CATHETERS) ×3 IMPLANT
DEVICE INFLAT 30 PLUS (MISCELLANEOUS) ×3 IMPLANT
DEVICE RAD TR BAND REGULAR (VASCULAR PRODUCTS) ×3 IMPLANT
GLIDESHEATH SLEND SS 6F .021 (SHEATH) ×3 IMPLANT
KIT MANI 3VAL PERCEP (MISCELLANEOUS) ×3 IMPLANT
PACK CARDIAC CATH (CUSTOM PROCEDURE TRAY) ×3 IMPLANT
WIRE ROSEN-J .035X260CM (WIRE) ×3 IMPLANT
WIRE RUNTHROUGH .014X180CM (WIRE) ×3 IMPLANT

## 2018-05-22 NOTE — Consult Note (Signed)
Cardiology Consultation:   Patient ID: Renee Mack; [id number]; [birth year]   Admit date: 05/22/2018 Date of Consult: 05/22/2018  Primary Care Provider: Olin Hauser, DO Primary Cardiologist: Fletcher Anon   Patient Profile:   Renee Mack is a 52 y.o. female with a hx of CAD s/p MI and drug-eluting stent placement to LAD x3 in 12/2017, with subsequent readmission for subacute stent thrombosis and repeat PCI x2, combined systolic and diastolic congestive heart failure, ischemic cardiomyopathy with EF of 45 to 50%, former tobacco abuse, COPD, hyperlipidemia, GERD, poorly controlled type 2 diabetes mellitus, and pancreatitis who is being seen today for the evaluation of chest pain at the request of Dr. Marcille Blanco.  History of Present Illness:   Renee Mack has had a complicated few months following initial STEMI the first of July for which she underwent cardiac cath that showed LAD 100p, 52m, 80d, RCA 90p (non-dominant). She required three DES to the LAD. Her recovery was complicated by cardiogenic shock and hypotension requiring vasopressors. Following initial MI, EF was reduced to 30-35% and was provided a LifeVest at discharge. This was followed by two separate admissions for chest, neck, arm, and back pain with associated n/v. She underwent a cardiac cath, once in July and again in August. Each requiring PTCA and stent placement related to ISR of the LAD. Upon most recent cath/PCI, under-expansion of prior LAD stents was noted. For recurrent pain, it was recommended to consider PCI of the nondominant RCA with possible hemodynamic assessment of the coronary-pulmonary artery fisulae to exclude coronary steal phenomenon. Repeat echo showed continued improvement, with latest echo showing an EF of 45-50% in October. Her LifeVest was able to be discontinued at that time. Since she has not had any further complications from her stent placements. She has had issues with neck, arm and back pain related  to cervical disc disease that was found on MRI in August and was admitted again in October for the same pain. Her cardiac work up was negative at that time, her pain was reproducible with movement and attributed to cervical disc disease. She was last seen in the office on 05/03/2018, and was doing very well at that time. She denied any chest pain or dyspnea. Her activity level was improving as well. She has been noted to have relative hypotension which has precluded the addition of long-acting nitrate therapy.   Patient indicates she had eben doing quite well. She went to sleep on the night of 12/1 in her usual state of health and was feeling reasonably well. She had been noting some mild, brief episodes of chest pressure, though did not think much of these. She was woken up out of sleep at ~ 2 or 2:30 AM this morning with severe chest pain that was described as 8/10, pressure in quality, and radiated to her left shoulder, neck, and down her left arm. There was associated SOB, diaphoresis, nausea, and one episode of emesis. She is unable to tell me how long these symptoms lasted. She took a SL NTG without much help. She contacted EMS.   Upon the patient's arrival to [facility] they were found to have stable vitals with relative hypotension, oxygen saturation 93% on room air, weight 62.6 kg. EKG showed nonspecific changes as below, CXR showed no active disease. Labs showed troponin negative x 1, WBC 14, HGB 14.1, PLT 235, sodium 132 (corrected for hyperglycemia - 142), potassium 4.1, glucose 532, lipase 17. In the ED, she has received Fentanyl, Ativan, and Zofran.  She continues to note 6/10 chest pain at this time. Pain felt similar to her prior episodes leading to PCI. She reports compliance with all medications, including DAPT.   Past Medical History:  Diagnosis Date  . Cancer ([place])    vulvular  . Cervical disc disease    a. 01/2018 MRI Cervical spine: Cervical spondylosis with multilevel disc and facet  degeneration greatest at C5-6 and C6-7.  Moderate to sev R C5-6, mod L C5-6, and mod bilat C6-7 foraminal stenosis with multilevel mild foraminal stenosis.  Mild C5-6 and mod C6-7 canal stenosis. C6-7 central cord impingement w/ cord flattening.  . Chronic combined systolic (congestive) and diastolic (congestive) heart failure ([place])    a. 12/2017 Echo: EF 30-35%, mid-apicalanteroseptal, ant, and apical AK. Gr1 DD. Mild conc LVH; b. 03/2018 Echo: EF 45-50%, antsept, ant HK. Mild MR. Nl LA size. Nl RV fxn.  Marland Kitchen COPD (chronic obstructive pulmonary disease) (H[facility])    not on home oxygen  . Coronary artery disease    a. 12/2017 ACS/PCI: LAD 100p (2.25x26 Resolute Onyx DES), 83m (2.0x12 Resolute Onyx DES), 80d (2.0x15 Resolute Onyx DES), RCA 90p (non-dominant). EF 25-35%. Post-MI course complicated by CGS; b. 07/6832 NSTEMI/subacute thrombosis-->LAD 100 (PTCA + DES x 1); c. 01/2018 NSTEMI/PCI: LM min irregs, LAD 50-60p ISR/hazy (3.5x12 Resolute DES), 31m/d (underexpansion of prior stents-->PTCA). EF 45-50%.  . Diabetes mellitus without complication ([place])   . GERD (gastroesophageal reflux disease)   . H/O degenerative disc disease   . Hypotension   . Ischemic cardiomyopathy    a. 12/2017 Echo: EF 30-35%; b. 03/2018 Echo: EF 45-50%.  . Myocardial infarction ([place])    a. 12/2017-->DES to LAD x 3.  . Pancreatitis   . Shingles   . Spinal stenosis   . Tobacco abuse   . Ulcer (traumatic) of oral mucosa   . Urinary incontinence     Past Surgical History:  Procedure Laterality Date  . ABDOMINAL HYSTERECTOMY     partial  . CORONARY STENT INTERVENTION N/A 12/26/2017   Procedure: CORONARY STENT INTERVENTION;  Surgeon: Wellington Hampshire, MD;  Location: [place] [facility];  Service: Cardiovascular;  Laterality: N/A;  . CORONARY STENT INTERVENTION N/A 02/08/2018   Procedure: CORONARY STENT INTERVENTION;  Surgeon: Nelva Bush, MD;  Location: [place] [facility];  Service: Cardiovascular;  Laterality: N/A;  .  CORONARY/GRAFT ACUTE MI REVASCULARIZATION N/A 12/19/2017   Procedure: Coronary/Graft Acute MI Revascularization;  Surgeon: Wellington Hampshire, MD;  Location: [place] [facility];  Service: Cardiovascular;  Laterality: N/A;  . ECTOPIC PREGNANCY SURGERY Left   . INTRAVASCULAR ULTRASOUND/IVUS N/A 02/08/2018   Procedure: Intravascular Ultrasound/IVUS;  Surgeon: Nelva Bush, MD;  Location: [place] [facility];  Service: Cardiovascular;  Laterality: N/A;  . LEFT HEART CATH AND CORONARY ANGIOGRAPHY N/A 12/19/2017   Procedure: LEFT HEART CATH AND CORONARY ANGIOGRAPHY;  Surgeon: Wellington Hampshire, MD;  Location: [place] [facility];  Service: Cardiovascular;  Laterality: N/A;  . LEFT HEART CATH AND CORONARY ANGIOGRAPHY N/A 12/26/2017   Procedure: LEFT HEART CATH AND CORONARY ANGIOGRAPHY;  Surgeon: Wellington Hampshire, MD;  Location: [place] [facility];  Service: Cardiovascular;  Laterality: N/A;  . LEFT HEART CATH AND CORONARY ANGIOGRAPHY N/A 02/08/2018   Procedure: LEFT HEART CATH AND CORONARY ANGIOGRAPHY;  Surgeon: Nelva Bush, MD;  Location: [place] [facility];  Service: Cardiovascular;  Laterality: N/A;  . LYMPHADENECTOMY    . SPLENECTOMY, PARTIAL    . vulvulasectomy  Home Meds: Prior to Admission medications   Medication Sig Start Date End Date Taking? Authorizing Provider  acetaminophen (TYLENOL) 325 MG tablet Take 650 mg by mouth every 6 (six) hours as needed for mild pain.    Yes [provider]  albuterol (PROAIR HFA) 108 (90 Base) MCG/ACT inhaler Inhale 1 puff into the lungs every 6 (six) hours as needed for wheezing or shortness of breath.    Yes [provider]  aspirin 81 MG tablet Take 81 mg by mouth daily.   Yes [provider]  atorvastatin (LIPITOR) 80 MG tablet Take 1 tablet (80 mg total) by mouth daily at 6 PM. 01/10/18 03/25/19 Yes Theora Gianotti, NP  citalopram (CELEXA) 20 MG tablet Take 1 tablet (20 mg total) by mouth daily. 03/29/18   Yes Karamalegos, Devonne Doughty, DO  esomeprazole (NEXIUM) 40 MG capsule Take 40 mg by mouth daily.    Yes [provider]  insulin aspart (NOVOLOG) 100 UNIT/ML injection Inject 14 Units into the skin 3 (three) times daily before meals. Take 10 units PLUS sliding scale 03/29/18  Yes Karamalegos, Alexander J, DO  insulin glargine (LANTUS) 100 UNIT/ML injection Inject 0.7 mLs (70 Units total) into the skin at bedtime. 02/27/18  Yes Karamalegos, Devonne Doughty, DO  LORazepam (ATIVAN) 1 MG tablet Take 1 tablet (1 mg total) by mouth 2 (two) times daily as needed for anxiety. 02/27/18  Yes Karamalegos, Devonne Doughty, DO  metoprolol succinate (TOPROL-XL) 25 MG 24 hr tablet Take 1 tablet (25 mg total) by mouth daily. 02/21/18  Yes Theora Gianotti, NP  ondansetron (ZOFRAN) 4 MG tablet Take 1 tablet (4 mg total) by mouth daily as needed. 01/05/18  Yes Schaevitz, Randall An, MD  promethazine (PHENERGAN) 25 MG tablet Take 25 mg by mouth every 6 (six) hours as needed for nausea. for nausea 05/15/18  Yes [provider]  ranolazine (RANEXA) 500 MG 12 hr tablet Take 1 tablet (500 mg total) by mouth 2 (two) times daily. 03/09/18  Yes Minna Merritts, MD  ticagrelor (BRILINTA) 90 MG TABS tablet Take 90 mg by mouth 2 (two) times daily.   Yes [provider]    Inpatient Medications: Scheduled Meds: . aspirin  81 mg Oral Daily  . atorvastatin  80 mg Oral q1800  . citalopram  20 mg Oral Daily  . docusate sodium  100 mg Oral BID  . enoxaparin (LOVENOX) injection  40 mg Subcutaneous Q24H  . insulin aspart  0-15 Units Subcutaneous TID WC  . insulin aspart  0-5 Units Subcutaneous QHS  . insulin glargine  42 Units Subcutaneous QHS  . metoprolol succinate  25 mg Oral Daily  . pantoprazole  80 mg Oral Q1200  . ranolazine  500 mg Oral BID  . ticagrelor  90 mg Oral BID   Continuous Infusions:  PRN Meds: acetaminophen **OR** acetaminophen, albuterol, LORazepam, ondansetron **OR** ondansetron  (ZOFRAN) IV  Allergies:   Allergies  Allergen Reactions  . Bee Venom Itching, Shortness Of Breath and Swelling  . Metformin And Related Shortness Of Breath  . Contrast Allergy Premed Pack [Prednisone & Diphenhydramine]     Rash and itching  . Darvon [Propoxyphene] Itching  . Gabapentin Swelling  . Nsaids Other (See Comments)    Ulcers   . Tramadol Hives    Social History:   Social History   Socioeconomic History  . Marital status: Widowed    Spouse name: Not on file  . Number of children: Not on  file  . Years of education: Not on file  . Highest education level: Not on file  Occupational History  . Not on file  Social Needs  . Financial resource strain: Not very hard  . Food insecurity:    Worry: Never true    Inability: Never true  . Transportation needs:    Medical: No    Non-medical: No  Tobacco Use  . Smoking status: Former Smoker    Packs/day: 0.50    Years: 30.00    Pack years: 15.00    Types: Cigarettes  . Smokeless tobacco: Never Used  . Tobacco comment: quit 12/19/2017.  Substance and Sexual Activity  . Alcohol use: No  . Drug use: No  . Sexual activity: Not on file  Lifestyle  . Physical activity:    Days per week: 7 days    Minutes per session: 30 min  . Stress: Very much  Relationships  . Social connections:    Talks on phone: More than three times a week    Gets together: Patient refused    Attends religious service: Never    Active member of club or organization: No    Attends meetings of clubs or organizations: Never    Relationship status: Widowed  . Intimate partner violence:    Fear of current or ex partner: No    Emotionally abused: No    Physically abused: No    Forced sexual activity: No  Other Topics Concern  . Not on file  Social History Narrative   Lives in [place] by herself.  Does not routinely exercise.     Family History:   Family History  Problem Relation Age of Onset  . Cancer Mother   . Osteoporosis Sister     . Heart disease Brother   . Heart attack Father     ROS:  Review of Systems  Constitutional: Positive for diaphoresis and malaise/fatigue. Negative for chills, fever and weight loss.  HENT: Negative for congestion.   Eyes: Negative for discharge and redness.  Respiratory: Positive for shortness of breath. Negative for cough, hemoptysis, sputum production and wheezing.   Cardiovascular: Positive for chest pain. Negative for palpitations, orthopnea, claudication, leg swelling and PND.  Gastrointestinal: Positive for nausea and vomiting. Negative for abdominal pain, blood in stool, heartburn and melena.  Genitourinary: Negative for hematuria.  Musculoskeletal: Negative for falls and myalgias.  Skin: Negative for rash.  Neurological: Positive for weakness. Negative for dizziness, tingling, tremors, sensory change, speech change, focal weakness and loss of consciousness.  Endo/Heme/Allergies: Does not bruise/bleed easily.  Psychiatric/Behavioral: Negative for substance abuse. The patient is not nervous/anxious.   All other systems reviewed and are negative.     Physical Exam/Data:   Vitals:   05/22/18 0515 05/22/18 0530 05/22/18 0600 05/22/18 0645  BP: (!) 97/52 (!) 94/57 (!) 90/56 90/63  Pulse: 65 69 72 88  Resp: 18 18 (!) (P) 22 (P) 20  Temp:      TempSrc:      SpO2: 96% 94% 94% 95%  Weight:      Height:        Intake/Output Summary (Last 24 hours) at 05/22/2018 0704 Last data filed at 05/22/2018 0515 Gross per 24 hour  Intake -  Output 700 ml  Net -700 ml   Filed Weights   05/22/18 0333  Weight: 62.6 kg   Body mass index is 26.07 kg/m.   Physical Exam: General: Well developed, well nourished, in no acute distress. Head: Normocephalic,  atraumatic, sclera non-icteric, no xanthomas, nares without discharge.  Neck: Negative for carotid bruits. JVD not elevated. Lungs: Clear bilaterally to auscultation without wheezes, rales, or rhonchi. Breathing is unlabored. Heart:  RRR with S1 S2. No murmurs, rubs, or gallops appreciated. Abdomen: Soft, non-tender, non-distended with normoactive bowel sounds. No hepatomegaly. No rebound/guarding. No obvious abdominal masses. Msk:  Strength and tone appear normal for age. Extremities: No clubbing or cyanosis. No edema. Distal pedal pulses are 2+ and equal bilaterally. Neuro: Alert and oriented X 3. No facial asymmetry. No focal deficit. Moves all extremities spontaneously. Psych:  Responds to questions appropriately with a normal affect.   EKG:  The EKG was personally reviewed and demonstrates: NSR, 67 bpm, possible prior anterior infarct, anterolateral TWI Telemetry:  Telemetry was personally reviewed and demonstrates: NSR  Weights: Filed Weights   05/22/18 0333  Weight: 62.6 kg    Relevant CV Studies: LHC 02/08/2018: Conclusion     A drug-eluting stent was successfully placed using a STENT RESOLUTE ONYX 3.5X12.   Conclusions: 1. Significant two-vessel coronary artery disease involving proximal and midportion of previously stented LAD as well as nondominant RCA. 2. Patent stents in the proximal through distal LAD, with underexpansion in the proximal and mid portions as well as concern for area of early restenosis versus nonocclusive thrombus formation. 3. 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 4. Mildly reduced left ventricular systolic function with mid and apical anterior hypokinesis.  LVEF 45 to 50%. 5. Normal left ventricular systolic function. 6. Successful IVUS-guided PCI to LAD with stenting of the proximal LAD and angioplasty of the proximal and mid LAD stented segments with improved stent expansion and 0% residual stenosis.  Recommendations: 1. Continue aggressive secondary prevention as well as antianginal therapy. 2. Defer treatment of nondominant RCA. 3. If chest pain recurs, one would need  to consider PCI to nondominant RCA as a heroic measure and/or hemodynamic assessment of coronary-pulmonary artery fistulae to exclude coronary steal phenomenon.  Recommend dual antiplatelet therapy with Aspirin 81mg  daily and Ticagrelor 90mg  twice daily long-term (beyond 12 months) because of Long stented segment extending from the proximal through distal LAD with history of prior subacute stent thrombosis.  __________  Echo 03/25/2018: Study Conclusions  - Left ventricle: The cavity size was normal. Systolic function was   mildly reduced. The estimated ejection fraction was in the range   of 45% to 50%. Hypokinesis of the anteroseptal myocardium.   Hypokinesis of the anterior myocardium. Left ventricular   diastolic function parameters were normal. - Mitral valve: There was mild regurgitation. - Left atrium: The atrium was normal in size. - Right ventricle: Systolic function was normal. - Pulmonary arteries: Systolic pressure could not be accurately   estimated.  Impressions:  - Compared to previous study, EF has improved.   Laboratory Data:  Chemistry Recent Labs  Lab 05/22/18 0338  NA 132*  K 4.1  CL 97*  CO2 24  GLUCOSE 532*  BUN 17  CREATININE 0.59  CALCIUM 9.4  GFRNONAA >60  GFRAA >60  ANIONGAP 11    Recent Labs  Lab 05/22/18 0338  PROT 6.9  ALBUMIN 3.6  AST 11*  ALT 16  ALKPHOS 79  BILITOT 0.7   Hematology Recent Labs  Lab 05/22/18 0338  WBC 14.0*  RBC 4.53  HGB 14.1  HCT 41.3  MCV 91.2  MCH 31.1  MCHC 34.1  RDW 12.8  PLT 235  Cardiac Enzymes Recent Labs  Lab 05/22/18 0338  TROPONINI <0.03   No results for input(s): TROPIPOC in the last 168 hours.  BNPNo results for input(s): BNP, PROBNP in the last 168 hours.  DDimer No results for input(s): DDIMER in the last 168 hours.  Radiology/Studies:  Dg Chest Portable 1 View  Result Date: 05/22/2018 IMPRESSION: No active disease. Electronically Signed   By: Anner Crete M.D.   On:  05/22/2018 04:20    Assessment and Plan:   1. CAD involving the native coronary arteries with unstable angina: -Continues to note 6/10 chest pain -Initial troponin negative, continue to cycle to rule out -Schedule LHC with Dr. Fletcher Anon this morning -Recent echo as above -DAPT with ASA and Brilinta -Increase Ranexa to 1000 mg bid -Continue Toprol XL and Lipitor -Unable to add Imdur at this time secondary to relative hypotension  -Risks and benefits of cardiac catheterization have been discussed with the patient including risks of bleeding, bruising, infection, kidney damage, stroke, heart attack, and death. The patient understands these risks and is willing to proceed with the procedure. All questions have been answered and concerns listened to  2. HFrEF secondary to ICM: -She does not appear grossly volume up -Most recent echo from 03/2018 showed continued improvement in LVSF to 45-50% -Continue Toprol XL -Relative hypotension precludes addition of ACEi/ARB/spironolactone -EF no longer allows for Entresto (BP too soft regardless) -CHF education  3. HLD: -LDL of 60 from 02/2018 with normal AST/ALT in the ED on 12/2 -Continue Lipitor  4. Poorly controlled diabetes: -Per IM  5. Relative hypotension: -Precludes escalation of antianginal therapy as above  6. Cervical disk disease: -Improving -Outpatient follow up  7. Leukocytosis: -No obvious signs of infection at this time -Possibly inflammatory in the setting of #1 -Trend   For questions or updates, please contact [facility] Please consult [email or web address] for contact info under Cardiology/STEMI.   Signed, Christell Faith, PA-C [place] Pager: [phone] 05/22/2018, 7:04 AM

## 2018-05-22 NOTE — Interval H&P Note (Signed)
History and Physical Interval Note:  05/22/2018 11:03 AM  Renee Mack  has presented today for surgery, with the diagnosis of unstable angina  The various methods of treatment have been discussed with the patient and family. After consideration of risks, benefits and other options for treatment, the patient has consented to  Procedure(s): LEFT HEART CATH AND CORONARY ANGIOGRAPHY (N/A) as a surgical intervention .  The patient's history has been reviewed, patient examined, no change in status, stable for surgery.  I have reviewed the patient's chart and labs.  Questions were answered to the patient's satisfaction.     [name]

## 2018-05-22 NOTE — ED Notes (Signed)
Prime doc paged x2.

## 2018-05-22 NOTE — ED Notes (Signed)
Prime doc paged for order of insulin due to 414 level.

## 2018-05-22 NOTE — ED Notes (Signed)
Report called to [facility] in specials

## 2018-05-22 NOTE — ED Notes (Signed)
Pt Transported to Cath Lab Room 4

## 2018-05-22 NOTE — ED Notes (Signed)
CRITICAL LAB: GLUCOSE is 532, [facility], Dr. Jacqualine Code notified, orders received

## 2018-05-22 NOTE — Progress Notes (Signed)
52 year old female with past medical history significant for CAD status post MI and drug-eluting stents in July 2019, combined heart failure, cardiomyopathy with EF of 45%, smoking, COPD, poorly controlled diabetes presents to hospital secondary to chest pain.  Patient admitted by Dr. Marcille Blanco this morning.  Patient with ongoing chest pain when I briefly saw her.  Discussed the case with cardiology team.  Patient will be undergoing cardiac catheterization at this time. -First set of troponin is negative. -Continue cardiac medications.  Agree with current plan

## 2018-05-22 NOTE — H&P (Signed)
Renee Mack is an 52 y.o. female.   Chief Complaint: Chest pain HPI: The patient with past medical history of CAD status post PCI to LAD with drug-eluting stent in July 7858, COPD, systolic heart failure and history of vulvar cancer presents to the emergency department complaining of chest pain.  The pain awoke the patient from sleep.  She states that it began substernally and radiated into her left chest.  She also reports that her left arm aches and is numb.  She admits to one episode of nonbloody nonbilious emesis as well as shortness of breath and diaphoresis.  She took an aspirin at home and received aspirin again by EMS.  Nitroglycerin relieved her pain some.  EKG in the emergency department showed no changes in the patient's troponin remained negative.  However due to her cardiac risk factors the emergency department staff called the hospitalist service for further evaluation.  Past Medical History:  Diagnosis Date  . Cancer ([place])    vulvular  . Cervical disc disease    a. 01/2018 MRI Cervical spine: Cervical spondylosis with multilevel disc and facet degeneration greatest at C5-6 and C6-7.  Moderate to sev R C5-6, mod L C5-6, and mod bilat C6-7 foraminal stenosis with multilevel mild foraminal stenosis.  Mild C5-6 and mod C6-7 canal stenosis. C6-7 central cord impingement w/ cord flattening.  . Chronic combined systolic (congestive) and diastolic (congestive) heart failure ([place])    a. 12/2017 Echo: EF 30-35%, mid-apicalanteroseptal, ant, and apical AK. Gr1 DD. Mild conc LVH; b. 03/2018 Echo: EF 45-50%, antsept, ant HK. Mild MR. Nl LA size. Nl RV fxn.  Marland Kitchen COPD (chronic obstructive pulmonary disease) (H[facility])    not on home oxygen  . Coronary artery disease    a. 12/2017 ACS/PCI: LAD 100p (2.25x26 Resolute Onyx DES), 36m (2.0x12 Resolute Onyx DES), 80d (2.0x15 Resolute Onyx DES), RCA 90p (non-dominant). EF 25-35%. Post-MI course complicated by CGS; b. 01/5026 NSTEMI/subacute thrombosis-->LAD 100 (PTCA  + DES x 1); c. 01/2018 NSTEMI/PCI: LM min irregs, LAD 50-60p ISR/hazy (3.5x12 Resolute DES), 40m/d (underexpansion of prior stents-->PTCA). EF 45-50%.  . Diabetes mellitus without complication ([place])   . GERD (gastroesophageal reflux disease)   . H/O degenerative disc disease   . Hypotension   . Ischemic cardiomyopathy    a. 12/2017 Echo: EF 30-35%; b. 03/2018 Echo: EF 45-50%.  . Myocardial infarction ([place])    a. 12/2017-->DES to LAD x 3.  . Pancreatitis   . Shingles   . Spinal stenosis   . Tobacco abuse   . Ulcer (traumatic) of oral mucosa   . Urinary incontinence     Past Surgical History:  Procedure Laterality Date  . ABDOMINAL HYSTERECTOMY     partial  . CORONARY STENT INTERVENTION N/A 12/26/2017   Procedure: CORONARY STENT INTERVENTION;  Surgeon: Wellington Hampshire, MD;  Location: [facility];  Service: Cardiovascular;  Laterality: N/A;  . CORONARY STENT INTERVENTION N/A 02/08/2018   Procedure: CORONARY STENT INTERVENTION;  Surgeon: Nelva Bush, MD;  Location: [facility];  Service: Cardiovascular;  Laterality: N/A;  . CORONARY/GRAFT ACUTE MI REVASCULARIZATION N/A 12/19/2017   Procedure: Coronary/Graft Acute MI Revascularization;  Surgeon: Wellington Hampshire, MD;  Location: [facility];  Service: Cardiovascular;  Laterality: N/A;  . ECTOPIC PREGNANCY SURGERY Left   . INTRAVASCULAR ULTRASOUND/IVUS N/A 02/08/2018   Procedure: Intravascular Ultrasound/IVUS;  Surgeon: Nelva Bush, MD;  Location: [facility];  Service: Cardiovascular;  Laterality: N/A;  . LEFT HEART CATH  AND CORONARY ANGIOGRAPHY N/A 12/19/2017   Procedure: LEFT HEART CATH AND CORONARY ANGIOGRAPHY;  Surgeon: Wellington Hampshire, MD;  Location: [facility];  Service: Cardiovascular;  Laterality: N/A;  . LEFT HEART CATH AND CORONARY ANGIOGRAPHY N/A 12/26/2017   Procedure: LEFT HEART CATH AND CORONARY ANGIOGRAPHY;  Surgeon: Wellington Hampshire, MD;  Location: [facility];   Service: Cardiovascular;  Laterality: N/A;  . LEFT HEART CATH AND CORONARY ANGIOGRAPHY N/A 02/08/2018   Procedure: LEFT HEART CATH AND CORONARY ANGIOGRAPHY;  Surgeon: Nelva Bush, MD;  Location: [facility];  Service: Cardiovascular;  Laterality: N/A;  . LYMPHADENECTOMY    . SPLENECTOMY, PARTIAL    . vulvulasectomy      Family History  Problem Relation Age of Onset  . Cancer Mother   . Osteoporosis Sister   . Heart disease Brother   . Heart attack Father    Social History:  reports that she has quit smoking. Her smoking use included cigarettes. She has a 15.00 pack-year smoking history. She has never used smokeless tobacco. She reports that she does not drink alcohol or use drugs.  Allergies:  Allergies  Allergen Reactions  . Bee Venom Itching, Shortness Of Breath and Swelling  . Metformin And Related Shortness Of Breath  . Contrast Allergy Premed Pack [Prednisone & Diphenhydramine]     Rash and itching  . Darvon [Propoxyphene] Itching  . Gabapentin Swelling  . Nsaids Other (See Comments)    Ulcers   . Tramadol Hives    Prior to Admission medications   Medication Sig Start Date End Date Taking? Authorizing Provider  acetaminophen (TYLENOL) 325 MG tablet Take 650 mg by mouth every 6 (six) hours as needed for mild pain.    Yes [provider]  albuterol (PROAIR HFA) 108 (90 Base) MCG/ACT inhaler Inhale 1 puff into the lungs every 6 (six) hours as needed for wheezing or shortness of breath.    Yes [provider]  aspirin 81 MG tablet Take 81 mg by mouth daily.   Yes [provider]  atorvastatin (LIPITOR) 80 MG tablet Take 1 tablet (80 mg total) by mouth daily at 6 PM. 01/10/18 03/25/19 Yes Theora Gianotti, NP  citalopram (CELEXA) 20 MG tablet Take 1 tablet (20 mg total) by mouth daily. 03/29/18  Yes Karamalegos, Devonne Doughty, DO  esomeprazole (NEXIUM) 40 MG capsule Take 40 mg by mouth daily.    Yes [provider]  insulin  aspart (NOVOLOG) 100 UNIT/ML injection Inject 14 Units into the skin 3 (three) times daily before meals. Take 10 units PLUS sliding scale 03/29/18  Yes Karamalegos, Alexander J, DO  insulin glargine (LANTUS) 100 UNIT/ML injection Inject 0.7 mLs (70 Units total) into the skin at bedtime. 02/27/18  Yes Karamalegos, Devonne Doughty, DO  LORazepam (ATIVAN) 1 MG tablet Take 1 tablet (1 mg total) by mouth 2 (two) times daily as needed for anxiety. 02/27/18  Yes Karamalegos, Devonne Doughty, DO  metoprolol succinate (TOPROL-XL) 25 MG 24 hr tablet Take 1 tablet (25 mg total) by mouth daily. 02/21/18  Yes Theora Gianotti, NP  ondansetron (ZOFRAN) 4 MG tablet Take 1 tablet (4 mg total) by mouth daily as needed. 01/05/18  Yes Schaevitz, Randall An, MD  promethazine (PHENERGAN) 25 MG tablet Take 25 mg by mouth every 6 (six) hours as needed for nausea. for nausea 05/15/18  Yes [provider]  ranolazine (RANEXA) 500 MG 12 hr tablet Take 1 tablet (500 mg total) by  mouth 2 (two) times daily. 03/09/18  Yes Minna Merritts, MD  ticagrelor (BRILINTA) 90 MG TABS tablet Take 90 mg by mouth 2 (two) times daily.   Yes [provider]     Results for orders placed or performed during the hospital encounter of 05/22/18 (from the past 48 hour(s))  Troponin I - ONCE - STAT     Status: None   Collection Time: 05/22/18  3:38 AM  Result Value Ref Range   Troponin I <0.03 <0.03 ng/mL    Comment: Performed at [facility], [address] [place], [place] [address]  CBC with Differential/Platelet     Status: Abnormal   Collection Time: 05/22/18  3:38 AM  Result Value Ref Range   WBC 14.0 (H) 4.0 - 10.5 K/uL   RBC 4.53 3.87 - 5.11 MIL/uL   Hemoglobin 14.1 12.0 - 15.0 g/dL   HCT 41.3 36.0 - 46.0 %   MCV 91.2 80.0 - 100.0 fL   MCH 31.1 26.0 - 34.0 pg   MCHC 34.1 30.0 - 36.0 g/dL   RDW 12.8 11.5 - 15.5 %   Platelets 235 150 - 400 K/uL   nRBC 0.0 0.0 - 0.2 %   Neutrophils Relative % 73 %    Neutro Abs 10.2 (H) 1.7 - 7.7 K/uL   Lymphocytes Relative 21 %   Lymphs Abs 2.9 0.7 - 4.0 K/uL   Monocytes Relative 4 %   Monocytes Absolute 0.5 0.1 - 1.0 K/uL   Eosinophils Relative 2 %   Eosinophils Absolute 0.2 0.0 - 0.5 K/uL   Basophils Relative 0 %   Basophils Absolute 0.1 0.0 - 0.1 K/uL   Immature Granulocytes 0 %   Abs Immature Granulocytes 0.06 0.00 - 0.07 K/uL    Comment: Performed at [facility], [address] [place], [place] [address]  Comprehensive metabolic panel     Status: Abnormal   Collection Time: 05/22/18  3:38 AM  Result Value Ref Range   Sodium 132 (L) 135 - 145 mmol/L   Potassium 4.1 3.5 - 5.1 mmol/L   Chloride 97 (L) 98 - 111 mmol/L   CO2 24 22 - 32 mmol/L   Glucose, Bld 532 (HH) 70 - 99 mg/dL    Comment: CRITICAL RESULT CALLED TO, READ BACK BY AND VERIFIED WITH NOEL WEBSTER 05/22/18 0419 KBH    BUN 17 6 - 20 mg/dL   Creatinine, Ser 0.59 0.44 - 1.00 mg/dL   Calcium 9.4 8.9 - 10.3 mg/dL   Total Protein 6.9 6.5 - 8.1 g/dL   Albumin 3.6 3.5 - 5.0 g/dL   AST 11 (L) 15 - 41 U/L   ALT 16 0 - 44 U/L   Alkaline Phosphatase 79 38 - 126 U/L   Total Bilirubin 0.7 0.3 - 1.2 mg/dL   GFR calc non Af Amer >60 >60 mL/min   GFR calc Af Amer >60 >60 mL/min   Anion gap 11 5 - 15    Comment: Performed at [facility], [address] [place], [place] [address]  Lipase, blood     Status: None   Collection Time: 05/22/18  3:38 AM  Result Value Ref Range   Lipase 17 11 - 51 U/L    Comment: Performed at [facility], [address] [place], [place] [address]  Protime-INR     Status: None   Collection Time: 05/22/18  3:38 AM  Result Value Ref Range   Prothrombin Time 11.4 11.4 - 15.2  seconds   INR 0.84     Comment: Performed at [facility], [address] [place], [place] [address]  APTT     Status: None   Collection Time: 05/22/18  3:38 AM  Result Value Ref Range   aPTT 25 24 - 36 seconds    Comment: Performed at [facility], [address] [place], [place] [address]  Glucose, capillary     Status: Abnormal   Collection Time: 05/22/18  4:24 AM  Result Value Ref Range   Glucose-Capillary 530 (HH) 70 - 99 mg/dL   Comment 1 Notify RN    Comment 2 Document in Chart   Urinalysis, Complete w Microscopic     Status: Abnormal   Collection Time: 05/22/18  5:14 AM  Result Value Ref Range   Color, Urine STRAW (A) YELLOW   APPearance CLEAR (A) CLEAR   Specific Gravity, Urine 1.027 1.005 - 1.030   pH 6.0 5.0 - 8.0   Glucose, UA >=500 (A) NEGATIVE mg/dL   Hgb urine dipstick NEGATIVE NEGATIVE   Bilirubin Urine NEGATIVE NEGATIVE   Ketones, ur 5 (A) NEGATIVE mg/dL   Protein, ur NEGATIVE NEGATIVE mg/dL   Nitrite NEGATIVE NEGATIVE   Leukocytes, UA NEGATIVE NEGATIVE   RBC / HPF 0-5 0 - 5 RBC/hpf   WBC, UA 0-5 0 - 5 WBC/hpf   Bacteria, UA NONE SEEN NONE SEEN   Squamous Epithelial / LPF 0-5 0 - 5    Comment: Performed at [facility], [address] [place],  [address]   Dg Chest Portable 1 View  Result Date: 05/22/2018 CLINICAL DATA:  52 year old female with chest pain. EXAM: PORTABLE CHEST 1 VIEW COMPARISON:  Chest radiograph dated 03/24/2018 FINDINGS: The lungs are clear. There is no pleural effusion or pneumothorax. The cardiac silhouette is within normal limits. Coronary vascular calcification and atherosclerotic calcification of the aortic arch. No acute osseous pathology. IMPRESSION: No active disease. Electronically Signed   By: Anner Crete M.D.   On: 05/22/2018 04:20    Review of Systems  Constitutional: Negative for chills and fever.  HENT: Negative for sore throat and tinnitus.   Eyes: Negative for blurred vision and redness.  Respiratory: Positive for shortness of breath. Negative for cough.   Cardiovascular: Positive for chest pain. Negative for palpitations, orthopnea and PND.  Gastrointestinal: Positive for nausea and vomiting. Negative for abdominal pain and diarrhea.   Genitourinary: Negative for dysuria, frequency and urgency.  Musculoskeletal: Negative for joint pain and myalgias.  Skin: Negative for rash.       No lesions  Neurological: Negative for speech change, focal weakness and weakness.  Endo/Heme/Allergies: Does not bruise/bleed easily.       No temperature intolerance  Psychiatric/Behavioral: Negative for depression and suicidal ideas.    Blood pressure 94/62, pulse 63, temperature 97.9 F (36.6 C), temperature source Oral, resp. rate 17, height 5\' 1"  (1.549 m), weight 62.6 kg, SpO2 94 %. Physical Exam  Vitals reviewed. Constitutional: She is oriented to person, place, and time. She appears well-developed and well-nourished. No distress.  HENT:  Head: Normocephalic and atraumatic.  Mouth/Throat: Oropharynx is clear and moist.  Eyes: Pupils are equal, round, and reactive to light. Conjunctivae and EOM are normal. No scleral icterus.  Neck: Normal range of motion. Neck supple. No JVD present. No tracheal deviation present. No thyromegaly present.  Cardiovascular: Normal rate, regular rhythm and normal heart sounds. Exam reveals no gallop and no friction rub.  No murmur heard.  Respiratory: Effort normal and breath sounds normal.  GI: Soft. Bowel sounds are normal. She exhibits no distension. There is no tenderness.  Genitourinary:  Genitourinary Comments: Deferred  Musculoskeletal: Normal range of motion. She exhibits no edema.  Lymphadenopathy:    She has no cervical adenopathy.  Neurological: She is alert and oriented to person, place, and time. No cranial nerve deficit. She exhibits normal muscle tone.  Skin: Skin is warm and dry. No rash noted. No erythema.  Psychiatric: She has a normal mood and affect. Her behavior is normal. Judgment and thought content normal.     Assessment/Plan This is a 52 year old female admitted for chest pain. 1.  Chest pain: Relieved but patient still has nagging substernal pressure.  Continue to follow  cardiac biomarkers.  Monitor telemetry.  Consult cardiology.  Blood pressure runs low at baseline.  Try to avoid nitrates. 2.  Coronary artery disease: Unstable; continue aspirin and Brilinta.  The patient is on Ranexa daily. 3.  Diabetes mellitus type 2: Continue basal insulin as well as sliding scale insulin adjusted for hospital diet 4.  Hyperlipidemia: Continue statin therapy 5.  COPD: Stable; continue albuterol as needed 6.  DVT prophylaxis: Heparin 7.  GI prophylaxis: None The patient is a full code.  Time spent on admission orders and patient care approximately 45 minutes   Harrie Foreman, MD 05/22/2018, 5:42 AM

## 2018-05-22 NOTE — ED Notes (Signed)
Report called to Erica RN

## 2018-05-22 NOTE — ED Provider Notes (Signed)
[facility] Emergency Department Provider Note   ____________________________________________   First MD Initiated Contact with Patient 05/22/18 [phone]     (approximate)  I have reviewed the triage vital signs and the nursing notes.   HISTORY  Chief Complaint Chest Pain    HPI Ariyel Jeangilles is a 52 y.o. female strong history of multiple medical problems including notable coronary disease and COPD   Patient reports about 1 hour prior to arrival she began experiencing rather abrupt onset of pain while at rest over the left side of her chest that radiates with a tingling sensation in the left upper arm.  No motor weakness.  Pain is sits primarily across left side of her chest as a heavy feeling of pressure.  Associated with nausea and one episode of vomiting.  Denies abdominal pain.  No ripping tearing or moving pain.  No fevers or chills, reports in no recent illnesses.  She reports pain is moderate to severe.  Reports is very similar to when she had heart problems in the past.  Past Medical History:  Diagnosis Date  . Cancer ([place])    vulvular  . Cervical disc disease    a. 01/2018 MRI Cervical spine: Cervical spondylosis with multilevel disc and facet degeneration greatest at C5-6 and C6-7.  Moderate to sev R C5-6, mod L C5-6, and mod bilat C6-7 foraminal stenosis with multilevel mild foraminal stenosis.  Mild C5-6 and mod C6-7 canal stenosis. C6-7 central cord impingement w/ cord flattening.  . Chronic combined systolic (congestive) and diastolic (congestive) heart failure ([place])    a. 12/2017 Echo: EF 30-35%, mid-apicalanteroseptal, ant, and apical AK. Gr1 DD. Mild conc LVH; b. 03/2018 Echo: EF 45-50%, antsept, ant HK. Mild MR. Nl LA size. Nl RV fxn.  Marland Kitchen COPD (chronic obstructive pulmonary disease) (HCC)    not on home oxygen  . Coronary artery disease    a. 12/2017 ACS/PCI: LAD 100p (2.25x26 Resolute Onyx DES), 66m (2.0x12 Resolute Onyx DES), 80d (2.0x15  Resolute Onyx DES), RCA 90p (non-dominant). EF 25-35%. Post-MI course complicated by CGS; b. 08/2990 NSTEMI/subacute thrombosis-->LAD 100 (PTCA + DES x 1); c. 01/2018 NSTEMI/PCI: LM min irregs, LAD 50-60p ISR/hazy (3.5x12 Resolute DES), 20m/d (underexpansion of prior stents-->PTCA). EF 45-50%.  . Diabetes mellitus without complication ([place])   . GERD (gastroesophageal reflux disease)   . H/O degenerative disc disease   . Hypotension   . Ischemic cardiomyopathy    a. 12/2017 Echo: EF 30-35%; b. 03/2018 Echo: EF 45-50%.  . Myocardial infarction ([place])    a. 12/2017-->DES to LAD x 3.  . Pancreatitis   . Shingles   . Spinal stenosis   . Tobacco abuse   . Ulcer (traumatic) of oral mucosa   . Urinary incontinence     Patient Active Problem List   Diagnosis Date Noted  . History of ST elevation myocardial infarction (STEMI) 02/27/2018  . CAD (coronary artery disease) 02/27/2018  . Hyperlipidemia associated with type 2 diabetes mellitus ([place]) 02/27/2018  . Anxiety associated with depression 02/27/2018  . Chronic systolic heart failure ([place])   . Chest pain 12/26/2017  . Ischemic cardiomyopathy   . Former smoker   . Protein-calorie malnutrition, severe 07/18/2017  . Uncontrolled type 2 diabetes with neuropathy ([place]) 12/28/2014  . COPD (chronic obstructive pulmonary disease) ([place]) 12/28/2014  . GERD (gastroesophageal reflux disease) 12/28/2014  . History of shingles 12/28/2014  . Hyperlipidemia LDL goal <70 12/28/2014    Past Surgical History:  Procedure Laterality Date  .  ABDOMINAL HYSTERECTOMY     partial  . CORONARY STENT INTERVENTION N/A 12/26/2017   Procedure: CORONARY STENT INTERVENTION;  Surgeon: Wellington Hampshire, MD;  Location: [place] [facility];  Service: Cardiovascular;  Laterality: N/A;  . CORONARY STENT INTERVENTION N/A 02/08/2018   Procedure: CORONARY STENT INTERVENTION;  Surgeon: Nelva Bush, MD;  Location: [place] [facility];  Service: Cardiovascular;  Laterality: N/A;   . CORONARY/GRAFT ACUTE MI REVASCULARIZATION N/A 12/19/2017   Procedure: Coronary/Graft Acute MI Revascularization;  Surgeon: Wellington Hampshire, MD;  Location: [place] [facility];  Service: Cardiovascular;  Laterality: N/A;  . ECTOPIC PREGNANCY SURGERY Left   . INTRAVASCULAR ULTRASOUND/IVUS N/A 02/08/2018   Procedure: Intravascular Ultrasound/IVUS;  Surgeon: Nelva Bush, MD;  Location: [place] [facility];  Service: Cardiovascular;  Laterality: N/A;  . LEFT HEART CATH AND CORONARY ANGIOGRAPHY N/A 12/19/2017   Procedure: LEFT HEART CATH AND CORONARY ANGIOGRAPHY;  Surgeon: Wellington Hampshire, MD;  Location: [place] [facility];  Service: Cardiovascular;  Laterality: N/A;  . LEFT HEART CATH AND CORONARY ANGIOGRAPHY N/A 12/26/2017   Procedure: LEFT HEART CATH AND CORONARY ANGIOGRAPHY;  Surgeon: Wellington Hampshire, MD;  Location: [place] [facility];  Service: Cardiovascular;  Laterality: N/A;  . LEFT HEART CATH AND CORONARY ANGIOGRAPHY N/A 02/08/2018   Procedure: LEFT HEART CATH AND CORONARY ANGIOGRAPHY;  Surgeon: Nelva Bush, MD;  Location: [place] [facility];  Service: Cardiovascular;  Laterality: N/A;  . LYMPHADENECTOMY    . SPLENECTOMY, PARTIAL    . vulvulasectomy      Prior to Admission medications   Medication Sig Start Date End Date Taking? Authorizing Provider  acetaminophen (TYLENOL) 325 MG tablet Take 650 mg by mouth every 6 (six) hours as needed for mild pain.    Yes [provider]  albuterol (PROAIR HFA) 108 (90 Base) MCG/ACT inhaler Inhale 1 puff into the lungs every 6 (six) hours as needed for wheezing or shortness of breath.    Yes [provider]  aspirin 81 MG tablet Take 81 mg by mouth daily.   Yes [provider]  atorvastatin (LIPITOR) 80 MG tablet Take 1 tablet (80 mg total) by mouth daily at 6 PM. 01/10/18 03/25/19 Yes Theora Gianotti, NP  citalopram (CELEXA) 20 MG tablet Take 1 tablet (20 mg total) by mouth daily. 03/29/18  Yes  Karamalegos, Devonne Doughty, DO  esomeprazole (NEXIUM) 40 MG capsule Take 40 mg by mouth daily.    Yes [provider]  insulin aspart (NOVOLOG) 100 UNIT/ML injection Inject 14 Units into the skin 3 (three) times daily before meals. Take 10 units PLUS sliding scale 03/29/18  Yes Karamalegos, Alexander J, DO  insulin glargine (LANTUS) 100 UNIT/ML injection Inject 0.7 mLs (70 Units total) into the skin at bedtime. 02/27/18  Yes Karamalegos, Devonne Doughty, DO  LORazepam (ATIVAN) 1 MG tablet Take 1 tablet (1 mg total) by mouth 2 (two) times daily as needed for anxiety. 02/27/18  Yes Karamalegos, Devonne Doughty, DO  metoprolol succinate (TOPROL-XL) 25 MG 24 hr tablet Take 1 tablet (25 mg total) by mouth daily. 02/21/18  Yes Theora Gianotti, NP  ondansetron (ZOFRAN) 4 MG tablet Take 1 tablet (4 mg total) by mouth daily as needed. 01/05/18  Yes Schaevitz, Randall An, MD  promethazine (PHENERGAN) 25 MG tablet Take 25 mg by mouth every 6 (six) hours as needed for nausea. for nausea 05/15/18  Yes [provider]  ranolazine (RANEXA) 500 MG 12 hr tablet Take 1 tablet (500  mg total) by mouth 2 (two) times daily. 03/09/18  Yes Minna Merritts, MD  ticagrelor (BRILINTA) 90 MG TABS tablet Take 90 mg by mouth 2 (two) times daily.   Yes [provider]    Allergies Bee venom; Metformin and related; Contrast allergy premed pack [prednisone & diphenhydramine]; Darvon [propoxyphene]; Gabapentin; Nsaids; and Tramadol  Family History  Problem Relation Age of Onset  . Cancer Mother   . Osteoporosis Sister   . Heart disease Brother   . Heart attack Father     Social History Social History   Tobacco Use  . Smoking status: Former Smoker    Packs/day: 0.50    Years: 30.00    Pack years: 15.00    Types: Cigarettes  . Smokeless tobacco: Never Used  . Tobacco comment: quit 12/19/2017.  Substance Use Topics  . Alcohol use: No  . Drug use: No    Review of Systems Constitutional: No  fever/chills but does feel slightly fatigued Eyes: No visual changes. ENT: No sore throat. Cardiovascular: See HPI Respiratory: Denies shortness of breath. Gastrointestinal: No abdominal pain.  Did vomit once however and feels slightly nauseated. Genitourinary: Negative for dysuria. Musculoskeletal: Negative for back pain. Skin: Negative for rash. Neurological: Negative for headaches, areas of focal weakness or numbness.    ____________________________________________   PHYSICAL EXAM:  VITAL SIGNS: ED Triage Vitals  Enc Vitals Group     BP 05/22/18 0332 112/70     Pulse Rate 05/22/18 0332 63     Resp 05/22/18 0332 16     Temp 05/22/18 0332 97.9 F (36.6 C)     Temp Source 05/22/18 0332 Oral     SpO2 05/22/18 0332 93 %     Weight 05/22/18 0333 138 lb (62.6 kg)     Height 05/22/18 0333 5\' 1"  (1.549 m)     Head Circumference --      Peak Flow --      Pain Score 05/22/18 0333 8     Pain Loc --      Pain Edu? --      Excl. in [place]? --     Constitutional: Alert and oriented.  Ill-appearing, appears uncomfortable nauseated and moderate pain.  Reports left-sided chest pain. Eyes: Conjunctivae are normal. Head: Atraumatic. Nose: No congestion/rhinnorhea. Mouth/Throat: Mucous membranes are moist. Neck: No stridor.  Cardiovascular: Normal rate, regular rhythm. Grossly normal heart sounds.  Good peripheral circulation. Respiratory: Normal respiratory effort.  No retractions. Lungs CTAB. Gastrointestinal: Soft and nontender. No distention. Musculoskeletal: No lower extremity tenderness nor edema.  Strong pulses and peripheral extremities x4. Neurologic:  Normal speech and language. No gross focal neurologic deficits are appreciated.  Skin:  Skin is warm, dry and intact. No rash noted. Psychiatric: Mood and affect are slightly anxious. Speech and behavior are normal.  ____________________________________________   LABS (all labs ordered are listed, but only abnormal results are  displayed)  Labs Reviewed  CBC WITH DIFFERENTIAL/PLATELET - Abnormal; Notable for the following components:      Result Value   WBC 14.0 (*)    Neutro Abs 10.2 (*)    All other components within normal limits  COMPREHENSIVE METABOLIC PANEL - Abnormal; Notable for the following components:   Sodium 132 (*)    Chloride 97 (*)    Glucose, Bld 532 (*)    AST 11 (*)    All other components within normal limits  GLUCOSE, CAPILLARY - Abnormal; Notable for the following components:   Glucose-Capillary 530 (*)  All other components within normal limits  TROPONIN I  LIPASE, BLOOD  PROTIME-INR  APTT  URINALYSIS, COMPLETE (UACMP) WITH MICROSCOPIC  CBG MONITORING, ED   ____________________________________________  EKG  Initial EKG reviewed and entered by me at 3:30 AM Heart rate 70 QRS 90 QTc 450 Normal sinus rhythm, possible old anterior infarct.  A slight very nonspecific T wave abnormality is noted compared with previous in V3 and V4 without ST elevation or notable depressions.  Repeat EKG evaluate by me at 3:45 AM Heart rate 70 QRS 90 QTc 460 Sinus rhythm, again seen possible old anterior infarct.  No notable changes compared with previous.  Again no ST elevation or obvious notable acute ischemia denoted at this time when compared with priors.  ____________________________________________  [id number]  Dg Chest Portable 1 View  Result Date: 05/22/2018 CLINICAL DATA:  52 year old female with chest pain. EXAM: PORTABLE CHEST 1 VIEW COMPARISON:  Chest radiograph dated 03/24/2018 FINDINGS: The lungs are clear. There is no pleural effusion or pneumothorax. The cardiac silhouette is within normal limits. Coronary vascular calcification and atherosclerotic calcification of the aortic arch. No acute osseous pathology. IMPRESSION: No active disease. Electronically Signed   By: Anner Crete M.D.   On: 05/22/2018 04:20    Chest x-ray reviewed negative for acute and cardiac  calcifications are noted ____________________________________________   PROCEDURES  Procedure(s) performed: None  Procedures  Critical Care performed: No  ____________________________________________   INITIAL IMPRESSION / ASSESSMENT AND PLAN / ED COURSE  Pertinent labs & imaging results that were available during my care of the patient were reviewed by me and considered in my medical decision making (see chart for details).   Differential diagnosis includes, but is not limited to, ACS, aortic dissection, pulmonary embolism, cardiac tamponade, pneumothorax, pneumonia, pericarditis, myocarditis, GI-related causes including esophagitis/gastritis, and musculoskeletal chest wall pain.  The patient's previous history and presentation today my primary concern is certainly for ACS.  This would be highly unlikely to represent dissection especially given her normotensive to slightly hypotensive nature and her findings and history that she reports her same as with previous acute coronary syndromes.  2 EKGs here both reassuring, has some history of hypotension developed slight hypotension after nitroglycerin with EMS, will utilize fentanyl for pain control.  Continue to monitor the patient carefully, await cardiac labs.  Strong peripheral pulses in all extremities, no back pain no ripping or tearing pain.  She denies shortness of breath.  Does have associated nausea.  No abdominal pain.       ----------------------------------------- 5:06 AM on 05/22/2018 -----------------------------------------  Hyperglycemia noted, insulin given.  Patient reports compliance with her regimen.  No evidence to support DKA.  Also patient reports pain is partially relieved with fentanyl, additional dose ordered.  Avoiding nitrates due to the patient's history of hypotension, she does have chronic hypotension for which she takes Midrin.  Discussed case with Dr. Marcille Blanco, will admit for further care and management.   Thus far troponin is normal.  Not clearly ACS that is still very suspect.  ____________________________________________   FINAL CLINICAL IMPRESSION(S) / ED DIAGNOSES  Final diagnoses:  Moderate risk chest pain  Hyperglycemia        Note:  This document was prepared using Dragon voice recognition software and may include unintentional dictation errors       Delman Kitten, MD 05/22/18 0507

## 2018-05-22 NOTE — ED Notes (Addendum)
Call for report, Juliette Mangle, RN reports floor full, hold pt in ED  Dawn, RN and pt notified

## 2018-05-22 NOTE — ED Notes (Signed)
CBG 530 via POC

## 2018-05-22 NOTE — ED Notes (Signed)
ED TO INPATIENT HANDOFF REPORT  Name/Age/Gender Renee Mack 52 y.o. female  Code Status    Code Status Orders  (From admission, onward)         Start     Ordered   05/22/18 0607  Full code  Continuous     05/22/18 0606        Code Status History    Date Active Date Inactive Code Status Order ID Comments User Context   03/24/2018 1305 03/25/2018 1821 Full Code [id number]  Hillary Bow, MD ED   02/07/2018 0630 02/09/2018 1532 Full Code [id number]  Arta Silence, MD ED   01/26/2018 0811 01/28/2018 1803 Full Code [id number]  Harrie Foreman, MD Inpatient   01/15/2018 1342 01/17/2018 1617 Full Code [id number]  Demetrios Loll, MD Inpatient   12/26/2017 1538 12/30/2017 1928 Full Code [id number]  Wellington Hampshire, MD Inpatient   12/26/2017 1537 12/26/2017 1538 Full Code [id number]  Vaughan Basta, MD Inpatient   12/19/2017 0839 12/21/2017 1940 Full Code [id number]  Wellington Hampshire, MD Inpatient   11/03/2017 1031 11/04/2017 1850 Full Code [id number]  Fritzi Mandes, MD Inpatient   09/28/2017 1044 09/29/2017 2121 Full Code [id number]  Harrie Foreman, MD ED   07/17/2017 2004 07/20/2017 1840 Full Code [id number]  Henreitta Leber, MD ED   06/12/2017 2227 06/16/2017 1910 Full Code [id number]  Gorden Harms, MD Inpatient   12/28/2014 0305 12/28/2014 1919 Full Code [id number]  Lance Coon, MD Inpatient      Home/SNF/Other Home  Chief Complaint chest pain  Level of Care/Admitting Diagnosis ED Disposition    ED Disposition Condition [place]: [place] [[id number]]  Level of Care: Telemetry [5]  Diagnosis: Chest pain [177939]  Admitting Physician: Harrie Foreman [[id number]]  Attending Physician: Harrie Foreman [[id number]]  PT Class (Do Not Modify): Observation [104]  PT Acc Code (Do Not Modify): Observation [10022]       Medical History Past Medical History:  Diagnosis Date  . Cancer ([place])    vulvular  . Cervical disc disease    a. 01/2018  MRI Cervical spine: Cervical spondylosis with multilevel disc and facet degeneration greatest at C5-6 and C6-7.  Moderate to sev R C5-6, mod L C5-6, and mod bilat C6-7 foraminal stenosis with multilevel mild foraminal stenosis.  Mild C5-6 and mod C6-7 canal stenosis. C6-7 central cord impingement w/ cord flattening.  . Chronic combined systolic (congestive) and diastolic (congestive) heart failure ([place])    a. 12/2017 Echo: EF 30-35%, mid-apicalanteroseptal, ant, and apical AK. Gr1 DD. Mild conc LVH; b. 03/2018 Echo: EF 45-50%, antsept, ant HK. Mild MR. Nl LA size. Nl RV fxn.  Marland Kitchen COPD (chronic obstructive pulmonary disease) (HCC)    not on home oxygen  . Coronary artery disease    a. 12/2017 ACS/PCI: LAD 100p (2.25x26 Resolute Onyx DES), 86m (2.0x12 Resolute Onyx DES), 80d (2.0x15 Resolute Onyx DES), RCA 90p (non-dominant). EF 25-35%. Post-MI course complicated by CGS; b. 11/3333 NSTEMI/subacute thrombosis-->LAD 100 (PTCA + DES x 1); c. 01/2018 NSTEMI/PCI: LM min irregs, LAD 50-60p ISR/hazy (3.5x12 Resolute DES), 57m/d (underexpansion of prior stents-->PTCA). EF 45-50%.  . Diabetes mellitus without complication ([place])   . GERD (gastroesophageal reflux disease)   . H/O degenerative disc disease   . Hypotension   . Ischemic cardiomyopathy    a. 12/2017 Echo: EF 30-35%; b. 03/2018 Echo: EF 45-50%.  . Myocardial infarction ([place])    a. 12/2017-->DES to LAD x 3.  Marland Kitchen  Pancreatitis   . Shingles   . Spinal stenosis   . Tobacco abuse   . Ulcer (traumatic) of oral mucosa   . Urinary incontinence     Allergies Allergies  Allergen Reactions  . Bee Venom Itching, Shortness Of Breath and Swelling  . Metformin And Related Shortness Of Breath  . Contrast Allergy Premed Pack [Prednisone & Diphenhydramine]     Rash and itching  . Darvon [Propoxyphene] Itching  . Gabapentin Swelling  . Nsaids Other (See Comments)    Ulcers   . Tramadol Hives    IV Location/Drains/Wounds Patient Lines/Drains/Airways Status    Active Line/Drains/Airways    Name:   Placement date:   Placement time:   Site:   Days:   Peripheral IV 05/22/18 Left Antecubital   05/22/18    0328    Antecubital   less than 1          Labs/Imaging Results for orders placed or performed during the hospital encounter of 05/22/18 (from the past 48 hour(s))  Troponin I - ONCE - STAT     Status: None   Collection Time: 05/22/18  3:38 AM  Result Value Ref Range   Troponin I <0.03 <0.03 ng/mL    Comment: Performed at [facility], [address] [place], [place] [address]  CBC with Differential/Platelet     Status: Abnormal   Collection Time: 05/22/18  3:38 AM  Result Value Ref Range   WBC 14.0 (H) 4.0 - 10.5 K/uL   RBC 4.53 3.87 - 5.11 MIL/uL   Hemoglobin 14.1 12.0 - 15.0 g/dL   HCT 41.3 36.0 - 46.0 %   MCV 91.2 80.0 - 100.0 fL   MCH 31.1 26.0 - 34.0 pg   MCHC 34.1 30.0 - 36.0 g/dL   RDW 12.8 11.5 - 15.5 %   Platelets 235 150 - 400 K/uL   nRBC 0.0 0.0 - 0.2 %   Neutrophils Relative % 73 %   Neutro Abs 10.2 (H) 1.7 - 7.7 K/uL   Lymphocytes Relative 21 %   Lymphs Abs 2.9 0.7 - 4.0 K/uL   Monocytes Relative 4 %   Monocytes Absolute 0.5 0.1 - 1.0 K/uL   Eosinophils Relative 2 %   Eosinophils Absolute 0.2 0.0 - 0.5 K/uL   Basophils Relative 0 %   Basophils Absolute 0.1 0.0 - 0.1 K/uL   Immature Granulocytes 0 %   Abs Immature Granulocytes 0.06 0.00 - 0.07 K/uL    Comment: Performed at [facility], [address] [place], [place] [address]  Comprehensive metabolic panel     Status: Abnormal   Collection Time: 05/22/18  3:38 AM  Result Value Ref Range   Sodium 132 (L) 135 - 145 mmol/L   Potassium 4.1 3.5 - 5.1 mmol/L   Chloride 97 (L) 98 - 111 mmol/L   CO2 24 22 - 32 mmol/L   Glucose, Bld 532 (HH) 70 - 99 mg/dL    Comment: CRITICAL RESULT CALLED TO, READ BACK BY AND VERIFIED WITH NOEL WEBSTER 05/22/18 0419 KBH    BUN 17 6 - 20 mg/dL   Creatinine, Ser 0.59 0.44 - 1.00 mg/dL   Calcium 9.4 8.9 - 10.3  mg/dL   Total Protein 6.9 6.5 - 8.1 g/dL   Albumin 3.6 3.5 - 5.0 g/dL   AST 11 (L) 15 - 41 U/L   ALT 16 0 - 44 U/L   Alkaline Phosphatase 79 38 - 126 U/L   Total Bilirubin 0.7 0.3 -  1.2 mg/dL   GFR calc non Af Amer >60 >60 mL/min   GFR calc Af Amer >60 >60 mL/min   Anion gap 11 5 - 15    Comment: Performed at [facility], [address] [place], [place] [address]  Lipase, blood     Status: None   Collection Time: 05/22/18  3:38 AM  Result Value Ref Range   Lipase 17 11 - 51 U/L    Comment: Performed at [facility], [address] [place], [place] [address]  Protime-INR     Status: None   Collection Time: 05/22/18  3:38 AM  Result Value Ref Range   Prothrombin Time 11.4 11.4 - 15.2 seconds   INR 0.84     Comment: Performed at [facility], [address] [place], [place] [address]  APTT     Status: None   Collection Time: 05/22/18  3:38 AM  Result Value Ref Range   aPTT 25 24 - 36 seconds    Comment: Performed at [facility], [address] [place], [place] [address]  TSH     Status: None   Collection Time: 05/22/18  3:38 AM  Result Value Ref Range   TSH 1.447 0.350 - 4.500 uIU/mL    Comment: Performed by a 3rd Generation assay with a functional sensitivity of <=0.01 uIU/mL. Performed at [facility], [address] [place], [place] [address]   Glucose, capillary     Status: Abnormal   Collection Time: 05/22/18  4:24 AM  Result Value Ref Range   Glucose-Capillary 530 (HH) 70 - 99 mg/dL   Comment 1 Notify RN    Comment 2 Document in Chart   Urinalysis, Complete w Microscopic     Status: Abnormal   Collection Time: 05/22/18  5:14 AM  Result Value Ref Range   Color, Urine STRAW (A) YELLOW   APPearance CLEAR (A) CLEAR   Specific Gravity, Urine 1.027 1.005 - 1.030   pH 6.0 5.0 - 8.0   Glucose, UA >=500 (A) NEGATIVE mg/dL   Hgb urine dipstick NEGATIVE NEGATIVE   Bilirubin Urine NEGATIVE NEGATIVE   Ketones, ur 5 (A)  NEGATIVE mg/dL   Protein, ur NEGATIVE NEGATIVE mg/dL   Nitrite NEGATIVE NEGATIVE   Leukocytes, UA NEGATIVE NEGATIVE   RBC / HPF 0-5 0 - 5 RBC/hpf   WBC, UA 0-5 0 - 5 WBC/hpf   Bacteria, UA NONE SEEN NONE SEEN   Squamous Epithelial / LPF 0-5 0 - 5    Comment: Performed at [facility], [address] [place], [place] [address]  Glucose, capillary     Status: Abnormal   Collection Time: 05/22/18  7:45 AM  Result Value Ref Range   Glucose-Capillary 414 (H) 70 - 99 mg/dL   Dg Chest Portable 1 View  Result Date: 05/22/2018 CLINICAL DATA:  52 year old female with chest pain. EXAM: PORTABLE CHEST 1 VIEW COMPARISON:  Chest radiograph dated 03/24/2018 FINDINGS: The lungs are clear. There is no pleural effusion or pneumothorax. The cardiac silhouette is within normal limits. Coronary vascular calcification and atherosclerotic calcification of the aortic arch. No acute osseous pathology. IMPRESSION: No active disease. Electronically Signed   By: Anner Crete M.D.   On: 05/22/2018 04:20    Pending Labs Unresulted Labs (From admission, onward)    Start     Ordered   05/29/18 0500  Creatinine, serum  (enoxaparin (LOVENOX)    CrCl >/= 30 ml/min)  Weekly,   STAT  Comments:  while on enoxaparin therapy    05/22/18 0606   05/22/18 0705  Hemoglobin A1c  Add-on,   AD     05/22/18 0704   Signed and Held  Troponin I - Now Then Q6H  Now then every 6 hours,   R     Signed and Held          Vitals/Pain Today's Vitals   05/22/18 0645 05/22/18 0715 05/22/18 0730 05/22/18 0800  BP: 90/63 (Abnormal) 90/55 (Abnormal) 100/54 (Abnormal) 92/52  Pulse: 88 67 69 68  Resp: (Pended) 20 17 18 17   Temp:      TempSrc:      SpO2: 95% 95% 93% 94%  Weight:      Height:      PainSc:        Isolation Precautions No active isolations  Medications Medications  albuterol (PROVENTIL) (2.5 MG/3ML) 0.083% nebulizer solution 2.5 mg (has no administration in time range)  aspirin chewable tablet 81  mg (has no administration in time range)  atorvastatin (LIPITOR) tablet 80 mg (has no administration in time range)  pantoprazole (PROTONIX) EC tablet 80 mg (has no administration in time range)  metoprolol succinate (TOPROL-XL) 24 hr tablet 25 mg (has no administration in time range)  LORazepam (ATIVAN) tablet 1 mg (1 mg Oral Given 05/22/18 0702)  ranolazine (RANEXA) 12 hr tablet 500 mg (has no administration in time range)  ticagrelor (BRILINTA) tablet 90 mg (has no administration in time range)  citalopram (CELEXA) tablet 20 mg (has no administration in time range)  enoxaparin (LOVENOX) injection 40 mg (40 mg Subcutaneous Given 05/22/18 0643)  acetaminophen (TYLENOL) tablet 650 mg (has no administration in time range)    Or  acetaminophen (TYLENOL) suppository 650 mg (has no administration in time range)  ondansetron (ZOFRAN) tablet 4 mg ( Oral See Alternative 05/22/18 8675)    Or  ondansetron (ZOFRAN) injection 4 mg (4 mg Intravenous Given 05/22/18 0702)  docusate sodium (COLACE) capsule 100 mg (has no administration in time range)  insulin glargine (LANTUS) injection 42 Units (has no administration in time range)  insulin aspart (novoLOG) injection 0-15 Units (has no administration in time range)  insulin aspart (novoLOG) injection 0-5 Units (has no administration in time range)  fentaNYL (SUBLIMAZE) injection 50 mcg (50 mcg Intravenous Given 05/22/18 0354)  ondansetron (ZOFRAN) injection 4 mg (4 mg Intravenous Given 05/22/18 0352)  insulin aspart (novoLOG) injection 8 Units (8 Units Intravenous Given 05/22/18 0444)  fentaNYL (SUBLIMAZE) injection 50 mcg (50 mcg Intravenous Given 05/22/18 0448)    Mobility walks

## 2018-05-22 NOTE — ED Notes (Signed)
Pt placed on 2L [place] due to sats dropping to 86% on RA while sleeping.

## 2018-05-22 NOTE — ED Notes (Signed)
Pt c/o nausea and anxiety med released

## 2018-05-22 NOTE — ED Triage Notes (Signed)
Patient presents to Emergency Department via EMS with complaints of CP.  Left chest radiating to left fingertips, pressure squeezing, initially 6/10 now 8/10.  Pt hadd nausea, vomit x 1, dizzy and SOB.    Pt hx of MI in July with subsequent stent placed  Pt took 324 ASA and 1 SL nitro with some pain relief and EMSM gave another SL nitro  HX of HTN, DM, and vulva CA

## 2018-05-22 NOTE — Progress Notes (Signed)
Pt. C/o severe lower back pain secondary to "I have degenerative lower back disease and arthritis." Pt. Med. With 2 Roxicet p.o. & Flexeril 10 mg p.o.

## 2018-05-22 NOTE — Progress Notes (Addendum)
Pt. Med. With solumedrol IV and pepcid p.o. ( see MAR) for contrast allergy. After giving meds., pt. States "I'm not allergic to benadryl-I take it at home for my itching." Benadryl allergy removed from allergy list now. MD made aware. Dr. Fletcher Anon also made aware of BG:330. MD states "we will hold off on treating that for now."

## 2018-05-23 ENCOUNTER — Encounter: Payer: Self-pay | Admitting: Cardiovascular Disease

## 2018-05-23 DIAGNOSIS — I200 Unstable angina: Secondary | ICD-10-CM | POA: Diagnosis not present

## 2018-05-23 LAB — BASIC METABOLIC PANEL
Anion gap: 11 (ref 5–15)
BUN: 19 mg/dL (ref 6–20)
CO2: 24 mmol/L (ref 22–32)
Calcium: 9.2 mg/dL (ref 8.9–10.3)
Chloride: 101 mmol/L (ref 98–111)
Creatinine, Ser: 0.41 mg/dL — ABNORMAL LOW (ref 0.44–1.00)
GFR calc non Af Amer: >60 mL/min (ref >60)
Glucose, Bld: 315 mg/dL — ABNORMAL HIGH (ref 70–99)
Potassium: 3.8 mmol/L (ref 3.5–5.1)
SODIUM: 136 mmol/L (ref 135–145)

## 2018-05-23 LAB — GLUCOSE, CAPILLARY
Glucose-Capillary: 295 mg/dL — ABNORMAL HIGH (ref 70–99)
Glucose-Capillary: 330 mg/dL — ABNORMAL HIGH (ref 70–99)
Glucose-Capillary: 335 mg/dL — ABNORMAL HIGH (ref 70–99)
Glucose-Capillary: 376 mg/dL — ABNORMAL HIGH (ref 70–99)

## 2018-05-23 LAB — CBC
HCT: 42.8 % (ref 36.0–46.0)
Hemoglobin: 14.4 g/dL (ref 12.0–15.0)
MCH: 31.2 pg (ref 26.0–34.0)
MCHC: 33.6 g/dL (ref 30.0–36.0)
MCV: 92.6 fL (ref 80.0–100.0)
Platelets: 233 10*3/uL (ref 150–400)
RBC: 4.62 MIL/uL (ref 3.87–5.11)
RDW: 12.9 % (ref 11.5–15.5)
WBC: 16.6 10*3/uL — AB (ref 4.0–10.5)
nRBC: 0 % (ref 0.0–0.2)

## 2018-05-23 MED ORDER — INSULIN ASPART 100 UNIT/ML ~~LOC~~ SOLN
10.0000 [IU] | Freq: Three times a day (TID) | SUBCUTANEOUS | Status: DC
  Administered 2018-05-23: 10 [IU] via SUBCUTANEOUS
  Filled 2018-05-23: qty 1

## 2018-05-23 MED ORDER — INSULIN REGULAR HUMAN 100 UNIT/ML IJ SOLN
15.0000 [IU] | Freq: Once | INTRAMUSCULAR | Status: AC
  Administered 2018-05-23: 15 [IU] via INTRAVENOUS
  Filled 2018-05-23: qty 10

## 2018-05-23 MED ORDER — OXYCODONE-ACETAMINOPHEN 5-325 MG PO TABS: 1.0000 | ORAL_TABLET | Freq: Three times a day (TID) | ORAL | 0 refills | Status: DC | PRN

## 2018-05-23 MED ORDER — INSULIN GLARGINE 100 UNIT/ML ~~LOC~~ SOLN
55.0000 [IU] | Freq: Every day | SUBCUTANEOUS | Status: DC
  Filled 2018-05-23: qty 0.55

## 2018-05-23 NOTE — Progress Notes (Signed)
Progress Note  Patient Name: Renee Mack Date of Encounter: 05/23/2018  Primary Cardiologist: Fletcher Anon  Subjective   No further chest pain. No SOB. Notes her "blood sugars are messed up" this morning. Post cath labs stable. Has ambulated in the room without issues.   Inpatient Medications    Scheduled Meds: . aspirin  81 mg Oral Daily  . atorvastatin  80 mg Oral q1800  . citalopram  20 mg Oral Daily  . cyclobenzaprine  10 mg Oral BID  . docusate sodium  100 mg Oral BID  . enoxaparin (LOVENOX) injection  40 mg Subcutaneous Q24H  . insulin aspart  0-15 Units Subcutaneous TID WC  . insulin glargine  42 Units Subcutaneous QHS  . metoprolol succinate  25 mg Oral Daily  . pantoprazole  80 mg Oral Q1200  . ranolazine  500 mg Oral BID  . sodium chloride flush  3 mL Intravenous Q12H  . ticagrelor  90 mg Oral BID   Continuous Infusions: . sodium chloride     PRN Meds: sodium chloride, acetaminophen **OR** acetaminophen, albuterol, LORazepam, ondansetron **OR** ondansetron (ZOFRAN) IV, oxyCODONE-acetaminophen, sodium chloride flush   Vital Signs    Vitals:   05/22/18 1545 05/22/18 1635 05/22/18 2010 05/23/18 0518  BP: (!) 95/54 (!) 105/57 (!) 90/58 (!) 106/57  Pulse: 67 68 75 69  Resp: 20  16 16   Temp:  98.2 F (36.8 C) 98.5 F (36.9 C) 97.7 F (36.5 C)  TempSrc:  Oral Oral Oral  SpO2:  97% 95% 94%  Weight:    59.5 kg  Height:        Intake/Output Summary (Last 24 hours) at 05/23/2018 0811 Last data filed at 05/23/2018 0250 Gross per 24 hour  Intake 522.78 ml  Output 1750 ml  Net -1227.22 ml   Filed Weights   05/22/18 0333 05/22/18 0930 05/23/18 0518  Weight: 62.6 kg 62.6 kg 59.5 kg    Telemetry    NSR - Personally Reviewed  ECG    n/a - Personally Reviewed  Physical Exam   GEN: No acute distress.   Neck: No JVD. Cardiac: RRR, no murmurs, rubs, or gallops. Right radial cardiac cath site is well healing without bleeding, bruising, swelling, erythema,  warmth, or TPP. Radial pulse 2+.  Respiratory: Clear to auscultation bilaterally.  GI: Soft, nontender, non-distended.   MS: No edema; No deformity. Neuro:  Alert and oriented x 3; Nonfocal.  Psych: Normal affect.  Labs    Chemistry Recent Labs  Lab 05/22/18 0338 05/23/18 0348  NA 132* 136  K 4.1 3.8  CL 97* 101  CO2 24 24  GLUCOSE 532* 315*  BUN 17 19  CREATININE 0.59 0.41*  CALCIUM 9.4 9.2  PROT 6.9  --   ALBUMIN 3.6  --   AST 11*  --   ALT 16  --   ALKPHOS 79  --   BILITOT 0.7  --   GFRNONAA >60 >60  GFRAA >60 >60  ANIONGAP 11 11     Hematology Recent Labs  Lab 05/22/18 0338 05/23/18 0348  WBC 14.0* 16.6*  RBC 4.53 4.62  HGB 14.1 14.4  HCT 41.3 42.8  MCV 91.2 92.6  MCH 31.1 31.2  MCHC 34.1 33.6  RDW 12.8 12.9  PLT 235 233    Cardiac Enzymes Recent Labs  Lab 05/22/18 0338 05/22/18 1621 05/22/18 2141  TROPONINI <0.03 <0.03 <0.03   No results for input(s): TROPIPOC in the last 168 hours.   BNPNo results  for input(s): BNP, PROBNP in the last 168 hours.   DDimer No results for input(s): DDIMER in the last 168 hours.   Radiology    Dg Chest Portable 1 View  Result Date: 05/22/2018 IMPRESSION: No active disease. Electronically Signed   By: Anner Crete M.D.   On: 05/22/2018 04:20    Cardiac Studies   LHC 05/22/2018: Conclusion     Ost 2nd Diag to 2nd Diag lesion is 90% stenosed.  Previously placed Prox LAD drug eluting stent is widely patent.  Prox Cx lesion is 30% stenosed.  Prox RCA lesion is 90% stenosed.  Mid LAD lesion is 95% stenosed.  Previously placed Mid LAD to Dist LAD stent (unknown type) is widely patent.  Post intervention, there is a 0% residual stenosis.  Balloon angioplasty was performed using a BALLOON [place] Red[place] [id number].  LV end diastolic pressure is normal.   1.  Significant underlying three-vessel coronary artery disease with patent LAD stents except in the mid to distal segment where there is 95%  stenosis that appears to be a combination of restenosis and possible localized thrombosis (given that that lesion was soft to dilation).  Stable proximal RCA disease in a nondominant vessel.  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  2.  Normal left ventricular end-diastolic pressure.  Left ventricular angiography was not performed. 3.  Successful balloon angioplasty to the mid LAD with noncompliant balloon high pressure.  Recommendations: Continue indefinite dual antiplatelet therapy. Aggressive treatment of risk factors.  Likely discharge home tomorrow.     Patient Profile     52 y.o. female with history of CAD s/pMI and drug-eluting stent placement to LAD x3 in 12/2017,with subsequent readmission for subacute stent thrombosis and repeat PCI x2, combined systolic and diastolic congestive heart failure, ischemic cardiomyopathy with EF of 45 to 50%, former tobacco abuse, COPD, hyperlipidemia, GERD, poorly controlled type 2 diabetes mellitus, and pancreatitis who is being seen today for the evaluation of chest pain at the request of Dr. Marcille Blanco.  Assessment & Plan    1. CAD involving the native coronary arteries with unstable angina: -Chest pain free s/p PTCA of the LAD stent as above -Ruled out -DAPT with ASA and Brilinta, possibly indefinitely  -Continue Ranexa to 500 mg bid -Continue Toprol XL and Lipitor -Unable to add Imdur at this time secondary to relative hypotension  -Ambulate in the hallway -From a cardiac perspective, if she feels well with ambulation she could potentially be discharged today  2. HFrEF secondary to ICM: -She does not appear grossly volume up -Most recent echo from 03/2018 showed continued improvement in LVSF to 45-50% -Continue Toprol XL -Relative hypotension precludes addition of ACEi/ARB/spironolactone -EF no longer allows for Entresto (BP too soft regardless) -CHF education  3. HLD: -LDL of 60 from  02/2018 with normal AST/ALT in the ED on 12/2 -Continue Lipitor  4. Poorly controlled diabetes: -Per IM  5. Relative hypotension: -BP stable at this time -Precludes escalation of antianginal therapy as above  6. Cervical disk disease: -Improving -Outpatient follow up  7. Leukocytosis: -No obvious signs of infection at this time -Possibly inflammatory in the setting of #1 -Trending up -Per IM  For questions or updates, please contact [facility] Please consult [email or web address] for contact info under Cardiology/STEMI.    Signed, Christell Faith, PA-C [place] Pager: [phone] 05/23/2018, 8:11 AM

## 2018-05-23 NOTE — Progress Notes (Signed)
Pt ambulated around nurses station and tolerated well, no distress noted, no chest pain, VSS. Will continue to monitor.

## 2018-05-23 NOTE — Progress Notes (Signed)
Renee Mack Come to be D/C'd Home per MD order.  Discussed prescriptions and follow up appointments with the patient. Prescription given to patient, medication list explained in detail. Pt verbalized understanding. Sister at bedside to transport pt home.  Allergies as of 05/23/2018      Reactions   Bee Venom Itching, Shortness Of Breath, Swelling   Metformin And Related Shortness Of Breath   Darvon [propoxyphene] Itching   Gabapentin Swelling   Nsaids Other (See Comments)   Ulcers   Tramadol Hives      Medication List    TAKE these medications   acetaminophen 325 MG tablet Commonly known as:  TYLENOL Take 650 mg by mouth every 6 (six) hours as needed for mild pain.   aspirin 81 MG tablet Take 81 mg by mouth daily.   atorvastatin 80 MG tablet Commonly known as:  LIPITOR Take 1 tablet (80 mg total) by mouth daily at 6 PM.   citalopram 20 MG tablet Commonly known as:  CELEXA Take 1 tablet (20 mg total) by mouth daily.   esomeprazole 40 MG capsule Commonly known as:  NEXIUM Take 40 mg by mouth daily.   insulin aspart 100 UNIT/ML injection Commonly known as:  novoLOG Inject 14 Units into the skin 3 (three) times daily before meals. Take 10 units PLUS sliding scale What changed:  how much to take   insulin glargine 100 UNIT/ML injection Commonly known as:  LANTUS Inject 0.7 mLs (70 Units total) into the skin at bedtime. What changed:  how much to take   LORazepam 1 MG tablet Commonly known as:  ATIVAN Take 1 tablet (1 mg total) by mouth 2 (two) times daily as needed for anxiety.   metoprolol succinate 25 MG 24 hr tablet Commonly known as:  TOPROL-XL Take 1 tablet (25 mg total) by mouth daily.   ondansetron 4 MG tablet Commonly known as:  ZOFRAN Take 1 tablet (4 mg total) by mouth daily as needed.   oxyCODONE-acetaminophen 5-325 MG tablet Commonly known as:  PERCOCET/ROXICET Take 1 tablet by mouth every 8 (eight) hours as needed for moderate pain or severe pain  (degenerative disc disease; arthritis).   PROAIR HFA 108 (90 Base) MCG/ACT inhaler Generic drug:  albuterol Inhale 1 puff into the lungs every 6 (six) hours as needed for wheezing or shortness of breath.   promethazine 25 MG tablet Commonly known as:  PHENERGAN Take 25 mg by mouth every 6 (six) hours as needed for nausea. for nausea   ranolazine 500 MG 12 hr tablet Commonly known as:  RANEXA Take 1 tablet (500 mg total) by mouth 2 (two) times daily.   ticagrelor 90 MG Tabs tablet Commonly known as:  BRILINTA Take 90 mg by mouth 2 (two) times daily.       Vitals:   05/23/18 0946 05/23/18 1237  BP: (!) 100/49 (!) 100/56  Pulse: 85 69  Resp: 18 18  Temp: 97.7 F (36.5 C)   SpO2: 97% 99%    Tele box removed and returned. Skin clean, dry and intact without evidence of skin break down, no evidence of skin tears noted. IV catheter discontinued intact. Site without signs and symptoms of complications. Dressing and pressure applied. Pt denies pain at this time. No complaints noted.  An After Visit Summary was printed and given to the patient. Patient escorted via [place], and D/C home via private auto.  Renee Mack

## 2018-05-23 NOTE — Progress Notes (Addendum)
Inpatient Diabetes Program Recommendations  AACE/ADA: New Consensus Statement on Inpatient Glycemic Control (2019)  Target Ranges:  Prepandial:   less than 140 mg/dL      Peak postprandial:   less than 180 mg/dL (1-2 hours)      Critically ill patients:  140 - 180 mg/dL   Results for Renee Mack, Renee Mack (MRN [id number]) as of 05/23/2018 09:16  Ref. Range 05/22/2018 07:45 05/22/2018 09:47 05/22/2018 11:58 05/22/2018 16:38 05/22/2018 21:08 05/22/2018 23:56 05/23/2018 01:45 05/23/2018 02:23 05/23/2018 08:39  Glucose-Capillary Latest Ref Range: 70 - 99 mg/dL 414 (H) 330 (H) 171 (H) 370 (H) 440 (H) 400 (H) 330 (H) 295 (H) 335 (H)  Results for [name], [name] (MRN [id number]) as of 05/23/2018 09:16  Ref. Range 12/19/2017 09:34 03/24/2018 15:50 05/22/2018 03:38  Hemoglobin A1C Latest Ref Range: 4.8 - 5.6 % 12.1 (H) 11.9 (H) 11.9 (H)   Review of Glycemic Control  Diabetes history: DM2 Outpatient Diabetes medications: Lantus 70 units QHS, Novolog 10 units TID with meals plus additional units for correction Current orders for Inpatient glycemic control: Lantus 42 units QHS, Novolog 0-15 units TID with meals  Inpatient Diabetes Program Recommendations:  Insulin - Basal: Please consider increasing Lantus to 55 units QHS. Correction (SSI): Please consider ordering Novolog 0-5 units QHS for bedtime correction. Insulin - Meal Coverage: Please consider ordering Novolog 10 units TID with meals for meal coverage if patient eats at least 50% of meals.  NOTE: Patient received one time Solumedrol 125 mg on 05/22/18 which is contributing to noted hyperglycemia.   Addendum 05/23/18@12 :20-Spoke with patient about diabetes and home regimen for diabetes control. Patient reports that she recently started seeing Renae Gloss, NP (works with Dr. Gabriel Carina) for diabetes management (initial consult on 04/17/18) and was last seen on 04/20/18.  Patient reports that at the 04/20/18 office visit, she was told to increase Lantus to 100 units QHS and  Novolog to 25 units TID with meals. Patient states that she checks her glucose 2-3 times per day and that it has been running high but she feels it has slightly improved since increasing Lantus and Novolog.  Patient reports symptoms of hypoglycemia when glucose is less than 200 mg/dl  Patient reports that her last A1C was 12.5% at initial appointment with Renae Gloss, NP.  Discussed A1C results (11.9% on 05/22/18) and explained that her current A1C indicates an average glucose of 295 mg/dl over the past 2-3 months. Discussed glucose and A1C goals. Discussed importance of checking CBGs and maintaining good CBG control to prevent long-term and short-term complications. Encouraged patient to continuing working with Endocrinology to improve DM control. Reminded patient to keep a detailed record of glucose readings and insulin taken which she will need to take to doctor appointments.  Patient verbalized understanding of information discussed and she states that she has no further questions at this time related to diabetes. Updated dosages on home medication list for Lantus and Novolog as patient reported she was taking.  Thanks, Barnie Alderman, RN, MSN, CDE Diabetes Coordinator Inpatient Diabetes Program [phone] (Team Pager from 8am to 5pm)

## 2018-05-23 NOTE — Discharge Summary (Signed)
[facility] NAME: Renee Mack    MR#:  [id number]  DATE OF BIRTH:  1965/12/09  DATE OF ADMISSION:  05/22/2018   ADMITTING PHYSICIAN: Harrie Foreman, MD  DATE OF DISCHARGE:  05/23/18  PRIMARY CARE PHYSICIAN: Olin Hauser, DO   ADMISSION DIAGNOSIS:   Hyperglycemia [R73.9] Moderate risk chest pain [R07.9]  DISCHARGE DIAGNOSIS:   Active Problems:   Chest pain   Unstable angina ([place])   SECONDARY DIAGNOSIS:   Past Medical History:  Diagnosis Date  . Cancer ([place])    vulvular  . Cervical disc disease    a. 01/2018 MRI Cervical spine: Cervical spondylosis with multilevel disc and facet degeneration greatest at C5-6 and C6-7.  Moderate to sev R C5-6, mod L C5-6, and mod bilat C6-7 foraminal stenosis with multilevel mild foraminal stenosis.  Mild C5-6 and mod C6-7 canal stenosis. C6-7 central cord impingement w/ cord flattening.  . Chronic combined systolic (congestive) and diastolic (congestive) heart failure ([place])    a. 12/2017 Echo: EF 30-35%, mid-apicalanteroseptal, ant, and apical AK. Gr1 DD. Mild conc LVH; b. 03/2018 Echo: EF 45-50%, antsept, ant HK. Mild MR. Nl LA size. Nl RV fxn.  Marland Kitchen COPD (chronic obstructive pulmonary disease) ([facility])    not on home oxygen  . Coronary artery disease    a. 12/2017 ACS/PCI: LAD 100p (2.25x26 Resolute Onyx DES), 66m (2.0x12 Resolute Onyx DES), 80d (2.0x15 Resolute Onyx DES), RCA 90p (non-dominant). EF 25-35%. Post-MI course complicated by CGS; b. 0/0174 NSTEMI/subacute thrombosis-->LAD 100 (PTCA + DES x 1); c. 01/2018 NSTEMI/PCI: LM min irregs, LAD 50-60p ISR/hazy (3.5x12 Resolute DES), 55m/d (underexpansion of prior stents-->PTCA). EF 45-50%.  . Diabetes mellitus without complication ([place])   . GERD (gastroesophageal reflux disease)   . H/O degenerative disc disease   . Hypotension   . Ischemic cardiomyopathy    a. 12/2017 Echo: EF 30-35%; b. 03/2018 Echo: EF 45-50%.  . Myocardial  infarction ([place])    a. 12/2017-->DES to LAD x 3.  . Pancreatitis   . Shingles   . Spinal stenosis   . Tobacco abuse   . Ulcer (traumatic) of oral mucosa   . Urinary incontinence     HOSPITAL COURSE:   52 year old female with past medical history significant for CAD status post drug-eluting stents last in July 2019, with readmission for subacute stent thrombosis and repeat PCI after that, combined CHF, last known EF of 45 to 50%, former smoker, COPD, GERD, poorly controlled diabetes, vulvar cancer presents to hospital secondary to chest pain  1.  Unstable angina-with underlying CAD -prior stents and in-stent rethrombosis few months ago. -Status post cardiac catheterization and percutaneous angioplasty of prior stent in the LAD. -Continue indefinite treatment with aspirin, Brilinta.  Ranexa added for chest pain -Imdur can be added as outpatient if her blood pressure improves -On metoprolol and statin. -Patient has been ambulating well without any further chest pain.  2.  Uncontrolled diabetes mellitus-just started seeing the endocrinologist. -Restarted home doses of Lantus and NovoLog. -A1c is greater than 11  3.  Leukocytosis on admission-likely stress reaction. -No other source of infection identified, UA is normal and no fevers.  Follow-up as outpatient  4.  Ischemic cardiomyopathy with EF of 45 to 50% -Not on ACEI or ARB or spironolactone due to hypotension.  Continue to follow-up as outpatient with cardiology  5.  Vulvar cancer and chronic perineal pain-follow-up with the cancer center.  Patient ambulated well, will be  discharged home today  DISCHARGE CONDITIONS:   Guarded  CONSULTS OBTAINED:   Treatment Team:  Wellington Hampshire, MD End, Harrell Gave, MD  DRUG ALLERGIES:   Allergies  Allergen Reactions  . Bee Venom Itching, Shortness Of Breath and Swelling  . Metformin And Related Shortness Of Breath  . Darvon [Propoxyphene] Itching  . Gabapentin Swelling  .  Nsaids Other (See Comments)    Ulcers   . Tramadol Hives   DISCHARGE MEDICATIONS:   Allergies as of 05/23/2018      Reactions   Bee Venom Itching, Shortness Of Breath, Swelling   Metformin And Related Shortness Of Breath   Darvon [propoxyphene] Itching   Gabapentin Swelling   Nsaids Other (See Comments)   Ulcers   Tramadol Hives      Medication List    TAKE these medications   acetaminophen 325 MG tablet Commonly known as:  TYLENOL Take 650 mg by mouth every 6 (six) hours as needed for mild pain.   aspirin 81 MG tablet Take 81 mg by mouth daily.   atorvastatin 80 MG tablet Commonly known as:  LIPITOR Take 1 tablet (80 mg total) by mouth daily at 6 PM.   citalopram 20 MG tablet Commonly known as:  CELEXA Take 1 tablet (20 mg total) by mouth daily.   esomeprazole 40 MG capsule Commonly known as:  NEXIUM Take 40 mg by mouth daily.   insulin aspart 100 UNIT/ML injection Commonly known as:  novoLOG Inject 14 Units into the skin 3 (three) times daily before meals. Take 10 units PLUS sliding scale What changed:  how much to take   insulin glargine 100 UNIT/ML injection Commonly known as:  LANTUS Inject 0.7 mLs (70 Units total) into the skin at bedtime. What changed:  how much to take   LORazepam 1 MG tablet Commonly known as:  ATIVAN Take 1 tablet (1 mg total) by mouth 2 (two) times daily as needed for anxiety.   metoprolol succinate 25 MG 24 hr tablet Commonly known as:  TOPROL-XL Take 1 tablet (25 mg total) by mouth daily.   ondansetron 4 MG tablet Commonly known as:  ZOFRAN Take 1 tablet (4 mg total) by mouth daily as needed.   oxyCODONE-acetaminophen 5-325 MG tablet Commonly known as:  PERCOCET/ROXICET Take 1 tablet by mouth every 8 (eight) hours as needed for moderate pain or severe pain (degenerative disc disease; arthritis).   PROAIR HFA 108 (90 Base) MCG/ACT inhaler Generic drug:  albuterol Inhale 1 puff into the lungs every 6 (six) hours as needed  for wheezing or shortness of breath.   promethazine 25 MG tablet Commonly known as:  PHENERGAN Take 25 mg by mouth every 6 (six) hours as needed for nausea. for nausea   ranolazine 500 MG 12 hr tablet Commonly known as:  RANEXA Take 1 tablet (500 mg total) by mouth 2 (two) times daily.   ticagrelor 90 MG Tabs tablet Commonly known as:  BRILINTA Take 90 mg by mouth 2 (two) times daily.        DISCHARGE INSTRUCTIONS:   1.  PCP follow-up in 1 to 2 weeks 2.  Cardiology follow-up in 1 week 3.  Cardiac rehab  DIET:   Cardiac diet  ACTIVITY:   Activity as tolerated  OXYGEN:   Home Oxygen: No.  Oxygen Delivery: room air  DISCHARGE LOCATION:   home   If you experience worsening of your admission symptoms, develop shortness of breath, life threatening emergency, suicidal or homicidal thoughts you  must seek medical attention immediately by calling 911 or calling your MD immediately  if symptoms less severe.  You Must read complete instructions/literature along with all the possible adverse reactions/side effects for all the Medicines you take and that have been prescribed to you. Take any new Medicines after you have completely understood and accpet all the possible adverse reactions/side effects.   Please note  You were cared for by a hospitalist during your hospital stay. If you have any questions about your discharge medications or the care you received while you were in the hospital after you are discharged, you can call the unit and asked to speak with the hospitalist on call if the hospitalist that took care of you is not available. Once you are discharged, your primary care physician will handle any further medical issues. Please note that NO REFILLS for any discharge medications will be authorized once you are discharged, as it is imperative that you return to your primary care physician (or establish a relationship with a primary care physician if you do not have one) for  your aftercare needs so that they can reassess your need for medications and monitor your lab values.    On the day of Discharge:  VITAL SIGNS:   Blood pressure (!) 100/56, pulse 69, temperature 97.7 F (36.5 C), temperature source Oral, resp. rate 18, height 5\' 1"  (1.549 m), weight 59.5 kg, SpO2 99 %.  PHYSICAL EXAMINATION:    GENERAL:  52 y.o.-year-old patient lying in the bed with no acute distress.  EYES: Pupils equal, round, reactive to light and accommodation. No scleral icterus. Extraocular muscles intact.  HEENT: Head atraumatic, normocephalic. Oropharynx and nasopharynx clear.  NECK:  Supple, no jugular venous distention. No thyroid enlargement, no tenderness.  LUNGS: Normal breath sounds bilaterally, no wheezing, rales,rhonchi or crepitation. No use of accessory muscles of respiration. Decreased breath sounds at the bases. CARDIOVASCULAR: S1, S2 normal. No  rubs, or gallops.  2/6 systolic murmur is present ABDOMEN: Soft, non-tender, non-distended. Bowel sounds present. No organomegaly or mass.  EXTREMITIES: No pedal edema, cyanosis, or clubbing.  NEUROLOGIC: Cranial nerves II through XII are intact. Muscle strength 5/5 in all extremities. Sensation intact. Gait not checked.  PSYCHIATRIC: The patient is alert and oriented x 3.  SKIN: No obvious rash, lesion, or ulcer.   DATA REVIEW:   CBC Recent Labs  Lab 05/23/18 0348  WBC 16.6*  HGB 14.4  HCT 42.8  PLT 233    Chemistries  Recent Labs  Lab 05/22/18 0338 05/23/18 0348  NA 132* 136  K 4.1 3.8  CL 97* 101  CO2 24 24  GLUCOSE 532* 315*  BUN 17 19  CREATININE 0.59 0.41*  CALCIUM 9.4 9.2  AST 11*  --   ALT 16  --   ALKPHOS 79  --   BILITOT 0.7  --      Microbiology Results  Results for orders placed or performed during the hospital encounter of 01/05/18  Urine Culture     Status: Abnormal   Collection Time: 01/05/18  1:07 PM  Result Value Ref Range Status   Specimen Description   Final    URINE,  RANDOM Performed at [facility], [address], [place], [place] [address]    Special Requests   Final    NONE Performed at [facility], [address], [place], [place] [address]    Culture 60,000 COLONIES/mL STAPHYLOCOCCUS HOMINIS (A)  Final   Report Status 01/08/2018 FINAL  Final   Organism ID, Bacteria STAPHYLOCOCCUS HOMINIS (A)  Final      Susceptibility   Staphylococcus hominis - MIC*    CIPROFLOXACIN >=8 RESISTANT Resistant     GENTAMICIN 1 SENSITIVE Sensitive     NITROFURANTOIN <=16 SENSITIVE Sensitive     OXACILLIN >=4 RESISTANT Resistant     TETRACYCLINE >=16 RESISTANT Resistant     VANCOMYCIN 1 SENSITIVE Sensitive     TRIMETH/SULFA 40 SENSITIVE Sensitive     CLINDAMYCIN >=8 RESISTANT Resistant     RIFAMPIN <=0.5 SENSITIVE Sensitive     Inducible Clindamycin NEGATIVE Sensitive     * 60,000 COLONIES/mL STAPHYLOCOCCUS HOMINIS    RADIOLOGY:  No results found.   Management plans discussed with the patient, family and they are in agreement.  CODE STATUS:     Code Status Orders  (From admission, onward)         Start     Ordered   05/22/18 0607  Full code  Continuous     05/22/18 0606        Code Status History    Date Active Date Inactive Code Status Order ID Comments User Context   03/24/2018 1305 03/25/2018 1821 Full Code [id number]  Hillary Bow, MD ED   02/07/2018 0630 02/09/2018 1532 Full Code [id number]  Arta Silence, MD ED   01/26/2018 0811 01/28/2018 1803 Full Code [id number]  Harrie Foreman, MD Inpatient   01/15/2018 1342 01/17/2018 1617 Full Code [id number]  Demetrios Loll, MD Inpatient   12/26/2017 1538 12/30/2017 1928 Full Code [id number]  Wellington Hampshire, MD Inpatient   12/26/2017 1537 12/26/2017 1538 Full Code [id number]  Vaughan Basta, MD Inpatient   12/19/2017 0839 12/21/2017 1940 Full Code [id number]  Wellington Hampshire, MD Inpatient   11/03/2017 1031 11/04/2017 1850 Full Code [id number]  Fritzi Mandes, MD Inpatient   09/28/2017  1044 09/29/2017 2121 Full Code [id number]  Harrie Foreman, MD ED   07/17/2017 2004 07/20/2017 1840 Full Code [id number]  Henreitta Leber, MD ED   06/12/2017 2227 06/16/2017 1910 Full Code [id number]  Gorden Harms, MD Inpatient   12/28/2014 0305 12/28/2014 1919 Full Code [id number]  Lance Coon, MD Inpatient      TOTAL TIME TAKING CARE OF THIS PATIENT: 38 minutes.    Gladstone Lighter M.D on 05/23/2018 at 3:15 PM  Between 7am to 6pm - Pager - [phone]  After 6pm go to [email or web address] - Proofreader  [facility]  Office  [phone]  CC: Primary care physician; Olin Hauser, DO   Note: This dictation was prepared with Dragon dictation along with smaller phrase technology. Any transcriptional errors that result from this process are unintentional.

## 2018-05-23 NOTE — Progress Notes (Signed)
Cardiovascular and Pulmonary Nurse Navigator Note:    52 year old female with history of CAD s/p MI and DES placement to LAD in 12/2017 with readmission for stent thrombosis and repeat PCI x 2, combined systolic and diastolic CHF, ICM with EF of 45 to 50%, former tobacco abuse, COPD, HLD, GERD, poorly controlled DM, and pancreatitis who presented to the ED with c/o chest pain.  Patient s/p PTCA of the LAD stent yesterday.  Patient ruled out for MI.    Rounded on patient.  Patient sitting up in bed with HOB elevated.  CAD is not a new diagnosis for this patient.  This patient has been seen by the Cardiac Nurse Navigator in the past.    Discussed modifiable risk factors including controlling blood pressure, cholesterol, and blood sugar; following heart healthy diet; maintaining healthy weight; exercise; and smoking cessation, if applicable.   ? Discussed cardiac medications including rationale for taking, mechanisms of action, and side effects. Stressed the importance of taking medications as prescribed.    ? Smoking Cessation - Patient is a FORMER smoker.   ? Exercise - Benefits of exercised discussed.  Informed patient that his cardiologist has referred him to outpatient Cardiac Rehab. An overview of the program was provided.  Patient shared with this RN that she is currently working with a Diabetes specialist to help control her blood sugar.  She reported that she is having a hard time controlling it.  Patient also reported she has valvular cancer and will be needing treatment / surgery in the near future.   Patient declined to participate in Cardiac Rehab at this time due to these other health concerns.   ? Roanna Epley, RN, BSN, [place]  [facility] Cardiac & Pulmonary Rehab  Cardiovascular & Pulmonary Nurse Navigator  Direct Line: [phone]  Department Phone #: [phone] Fax: [phone]  Email Address: [email or web address]

## 2018-05-23 NOTE — Progress Notes (Signed)
Gynecologic Oncology Consult Visit   Referring Provider: Dr. Parks Ranger  Chief Complaint: Vulvar Cancer  Subjective:  Renee Mack is a 52 y.o. female s/p TH for "uterine cancer" and h/o vulvar cancer who is seen in consultation from Dr. Parks Ranger for possible recurrent vulvar cancer.    Gynecologic Oncology History: ~ 1994  History of 'uterine cancer' s/p hysterectomy in [place] for patient reported 'dysplasia; they'd tried burning and cutting it out'- vaginal hysterectomy; Retained ovaries and 1 tube.  05/05/2004- stage I vulvar cancer 1.3 mm invasion treated with left radical vulvectomy and bilateral groin node dissection 2013-   pap benign HPV negative 04/2012- right vulvectomy for microinvasive vulvar cancer and been 3 by Dr. Maudie Mercury 10/04/2012  MOHS surgery right groin for [facility] 10/15/2014- vulvar colposcopy revealed no suspicious lesions; patient was well-appearing and transferred to ED where she was diagnosed with DKA and pancreatitis 04/07/2017- presented to ED with vulvar pain exam notable for cobblestone epithelium at left labia minora (biopsied), cobblestoned, irritated appearing mucosa to left of urethra and vagina.  Approximately 2 mm isolated vesicle on right labia.    Macerated erythematous skin at buttocks abutting anus appearing chronically moist.  White epithelium at 6:00 of the anus but perianal exam limited by lack of stirrups.  Vulvar biopsy showed HSIL at least VIN 2. 04/12/17-  seen for ER follow-up by Dr. [name]. Per note, at least recurrent disease amenable to resection.  Left upper aspect of vulva not amenable to outpatient laser.  May also have right-sided disease but patient declined vulvar biopsy.    Chronic vulvar pain not reproducible. 11/13/2017-11/16/2017-admitted to hospital for vulvar cellulitis and underwent I&D, treated with broad-spectrum antibiotics and discharged on Bactrim. 11/24/2017-  ER visit for severe vulvovaginal candidiasis treated with oral fluconazole  and topical miconazole with oxycodone for breakthrough pain.   Patient lost to follow-up.   She has history of type 2 diabetes mellitus with hemoglobin A1c of 11.9% on 03/24/18 down from 15.1% on 11/14/2017. Current smoker. CAD s/p NSTEMI 12/2017 stenting x 5, chronic combined systolic and diastolic heart failure, ischemic cardiomyopathy, GERD, pancreatitis, multiple episodes of DKA with recurrent UTI, depression, panic disorder, chronic pain syndrome. LVEF 45-50%.  She says that recent heart attack "scared her" and that she realized that she needs to start taking better care of herself.  She has been advised that she needs additional surgery for treatment of vulvar cancer but cannot have surgery until her blood sugars are under better control. She continues indefinite anticoagulation with aspirin and brilinta (antiplatelet). She has vulvar pain , worse with urination, urinary incontinence. She also complains of night sweats, headaches.  She was discharged from hospital yesterday, 05/23/18 after admission for hyperglycemia and unstable angina with underlying CAD. She underwent cardiac catheterization and percutaneous angioplasty of prior stent.  Per her report she had a Pap 6 months ago but in the computer her last Pap was 03/2012 ([facility]). She complains of vulvar pain.    Problem List: Patient Active Problem List   Diagnosis Date Noted  . Vulvar intraepithelial neoplasia (VIN) 05/24/2018  . History of ST elevation myocardial infarction (STEMI) 02/27/2018  . CAD (coronary artery disease) 02/27/2018  . Hyperlipidemia associated with type 2 diabetes mellitus ([place]) 02/27/2018  . Anxiety associated with depression 02/27/2018  . Chronic systolic heart failure ([place])   . Chest pain 12/26/2017  . Unstable angina ([place])   . Ischemic cardiomyopathy   . Former smoker   . Protein-calorie malnutrition, severe 07/18/2017  . Uncontrolled type  2 diabetes with neuropathy ([place]) 12/28/2014  . COPD (chronic  obstructive pulmonary disease) ([place]) 12/28/2014  . GERD (gastroesophageal reflux disease) 12/28/2014  . History of shingles 12/28/2014  . Hyperlipidemia LDL goal <70 12/28/2014    Past Medical History: Past Medical History:  Diagnosis Date  . Cancer ([place])    vulvular  . Cervical disc disease    a. 01/2018 MRI Cervical spine: Cervical spondylosis with multilevel disc and facet degeneration greatest at C5-6 and C6-7.  Moderate to sev R C5-6, mod L C5-6, and mod bilat C6-7 foraminal stenosis with multilevel mild foraminal stenosis.  Mild C5-6 and mod C6-7 canal stenosis. C6-7 central cord impingement w/ cord flattening.  . Chronic combined systolic (congestive) and diastolic (congestive) heart failure ([place])    a. 12/2017 Echo: EF 30-35%, mid-apicalanteroseptal, ant, and apical AK. Gr1 DD. Mild conc LVH; b. 03/2018 Echo: EF 45-50%, antsept, ant HK. Mild MR. Nl LA size. Nl RV fxn.  Marland Kitchen COPD (chronic obstructive pulmonary disease) (H[facility])    not on home oxygen  . Coronary artery disease    a. 12/2017 ACS/PCI: LAD 100p (2.25x26 Resolute Onyx DES), 24m (2.0x12 Resolute Onyx DES), 80d (2.0x15 Resolute Onyx DES), RCA 90p (non-dominant). EF 25-35%. Post-MI course complicated by CGS; b. 06/6107 NSTEMI/subacute thrombosis-->LAD 100 (PTCA + DES x 1); c. 01/2018 NSTEMI/PCI: LM min irregs, LAD 50-60p ISR/hazy (3.5x12 Resolute DES), 20m/d (underexpansion of prior stents-->PTCA). EF 45-50%.  . Diabetes mellitus without complication ([place])   . GERD (gastroesophageal reflux disease)   . H/O degenerative disc disease   . Hypotension   . Ischemic cardiomyopathy    a. 12/2017 Echo: EF 30-35%; b. 03/2018 Echo: EF 45-50%.  . Myocardial infarction ([place])    a. 12/2017-->DES to LAD x 3.  . Pancreatitis   . Shingles   . Spinal stenosis   . Tobacco abuse   . Ulcer (traumatic) of oral mucosa   . Urinary incontinence     Past Surgical History: Past Surgical History:  Procedure Laterality Date  . ABDOMINAL HYSTERECTOMY      partial  . CORONARY BALLOON ANGIOPLASTY N/A 05/22/2018   Procedure: CORONARY BALLOON ANGIOPLASTY;  Surgeon: Wellington [place], MD;  Location: [facility];  Service: Cardiovascular;  Laterality: N/A;  . CORONARY STENT INTERVENTION N/A 12/26/2017   Procedure: CORONARY STENT INTERVENTION;  Surgeon: Wellington [place], MD;  Location: [place];  Service: Cardiovascular;  Laterality: N/A;  . CORONARY STENT INTERVENTION N/A 02/08/2018   Procedure: CORONARY STENT INTERVENTION;  Surgeon: Nelva Bush, MD;  Location: [facility];  Service: Cardiovascular;  Laterality: N/A;  . CORONARY/GRAFT ACUTE MI REVASCULARIZATION N/A 12/19/2017   Procedure: Coronary/Graft Acute MI Revascularization;  Surgeon: Wellington [place], MD;  Location: [place] [facility];  Service: Cardiovascular;  Laterality: N/A;  . ECTOPIC PREGNANCY SURGERY Left   . INTRAVASCULAR ULTRASOUND/IVUS N/A 02/08/2018   Procedure: Intravascular Ultrasound/IVUS;  Surgeon: Nelva Bush, MD;  Location: [place] [facility];  Service: Cardiovascular;  Laterality: N/A;  . LEFT HEART CATH AND CORONARY ANGIOGRAPHY N/A 12/19/2017   Procedure: LEFT HEART CATH AND CORONARY ANGIOGRAPHY;  Surgeon: Wellington [place], MD;  Location: [place] [facility];  Service: Cardiovascular;  Laterality: N/A;  . LEFT HEART CATH AND CORONARY ANGIOGRAPHY N/A 12/26/2017   Procedure: LEFT HEART CATH AND CORONARY ANGIOGRAPHY;  Surgeon: Wellington [place], MD;  Location: [place];  Service: Cardiovascular;  Laterality: N/A;  . LEFT HEART CATH AND CORONARY ANGIOGRAPHY N/A 02/08/2018  Procedure: LEFT HEART CATH AND CORONARY ANGIOGRAPHY;  Surgeon: Nelva Bush, MD;  Location: [place] [facility];  Service: Cardiovascular;  Laterality: N/A;  . LEFT HEART CATH AND CORONARY ANGIOGRAPHY N/A 05/22/2018   Procedure: LEFT HEART CATH AND CORONARY ANGIOGRAPHY;  Surgeon: Wellington [place], MD;  Location: [facility] [facility];  Service:  Cardiovascular;  Laterality: N/A;  . LYMPHADENECTOMY    . SPLENECTOMY, PARTIAL    . vulvulasectomy      Past Gynecologic History:  Menarche: age 32 Menstrual details- irregular menstrual periods Hysterectomy ~ 25 years ago History of abnormal pap: yes History of STDs: denies Sexually active: denies  OB History:  [id number] Vaginal delivery  OB History  No data available    Family History: Family History  Problem Relation Age of Onset  . Non-Hodgkin's lymphoma Mother   . Osteoporosis Mother   . Lupus Sister   . Heart disease Brother   . Heart attack Brother   . Heart attack Father     Social History: Social History   Socioeconomic History  . Marital status: Widowed    Spouse name: Not on file  . Number of children: 4  . Years of education: Not on file  . Highest education level: Not on file  Occupational History  . Occupation: disabled  Social Needs  . Financial resource strain: Not very hard  . Food insecurity:    Worry: Never true    Inability: Never true  . Transportation needs:    Medical: No    Non-medical: No  Tobacco Use  . Smoking status: Former Smoker    Packs/day: 0.50    Years: 30.00    Pack years: 15.00    Types: Cigarettes    Last attempt to quit: 12/19/2017    Years since quitting: 0.4  . Smokeless tobacco: Never Used  . Tobacco comment: quit 12/19/2017.  Substance and Sexual Activity  . Alcohol use: No  . Drug use: No  . Sexual activity: Not Currently  Lifestyle  . Physical activity:    Days per week: 7 days    Minutes per session: 30 min  . Stress: Very much  Relationships  . Social connections:    Talks on phone: More than three times a week    Gets together: Patient refused    Attends religious service: Never    Active member of club or organization: No    Attends meetings of clubs or organizations: Never    Relationship status: Widowed  . Intimate partner violence:    Fear of current or ex partner: No    Emotionally abused:  No    Physically abused: No    Forced sexual activity: No  Other Topics Concern  . Not on file  Social History Narrative   Lives in [place] by herself.  Does not routinely exercise.    Allergies: Allergies  Allergen Reactions  . Bee Venom Itching, Shortness Of Breath and Swelling  . Metformin And Related Shortness Of Breath  . Darvon [Propoxyphene] Itching  . Gabapentin Swelling  . Nsaids Other (See Comments)    Ulcers   . Tramadol Hives  . Contrast Media [Iodinated Diagnostic Agents] Rash    If you benadryl, and steroids she is able to take the contrast per pt    Current Medications: Current Outpatient Medications  Medication Sig Dispense Refill  . acetaminophen (TYLENOL) 325 MG tablet Take 650 mg by mouth every 6 (six) hours as needed for mild pain.     Marland Kitchen  albuterol (PROAIR HFA) 108 (90 Base) MCG/ACT inhaler Inhale 1 puff into the lungs every 6 (six) hours as needed for wheezing or shortness of breath.     Marland Kitchen aspirin 81 MG tablet Take 81 mg by mouth daily.    Marland Kitchen atorvastatin (LIPITOR) 80 MG tablet Take 1 tablet (80 mg total) by mouth daily at 6 PM. 90 tablet 3  . citalopram (CELEXA) 20 MG tablet Take 1 tablet (20 mg total) by mouth daily. 90 tablet 1  . esomeprazole (NEXIUM) 40 MG capsule Take 40 mg by mouth daily.     . insulin aspart (NOVOLOG) 100 UNIT/ML injection Inject 14 Units into the skin 3 (three) times daily before meals. Take 10 units PLUS sliding scale (Patient taking differently: Inject 25 Units into the skin 3 (three) times daily before meals. ) 10 mL 5  . insulin glargine (LANTUS) 100 UNIT/ML injection Inject 0.7 mLs (70 Units total) into the skin at bedtime. (Patient taking differently: Inject 100 Units into the skin at bedtime. ) 40 mL 3  . LORazepam (ATIVAN) 1 MG tablet Take 1 tablet (1 mg total) by mouth 2 (two) times daily as needed for anxiety. 60 tablet 0  . metoprolol succinate (TOPROL-XL) 25 MG 24 hr tablet Take 1 tablet (25 mg total) by mouth daily. 90  tablet 3  . oxyCODONE-acetaminophen (PERCOCET/ROXICET) 5-325 MG tablet Take 1 tablet by mouth every 8 (eight) hours as needed for moderate pain or severe pain (degenerative disc disease; arthritis). 15 tablet 0  . promethazine (PHENERGAN) 25 MG tablet Take 25 mg by mouth every 6 (six) hours as needed for nausea. for nausea  3  . ranolazine (RANEXA) 500 MG 12 hr tablet Take 1 tablet (500 mg total) by mouth 2 (two) times daily. 60 tablet 3  . ticagrelor (BRILINTA) 90 MG TABS tablet Take 90 mg by mouth 2 (two) times daily.    Marland Kitchen lidocaine (XYLOCAINE) 2 % jelly Apply 1 application topically as needed. 85 mL 0  . ondansetron (ZOFRAN) 4 MG tablet Take 1 tablet (4 mg total) by mouth daily as needed. 10 tablet 0  . terconazole (TERAZOL 3) 0.8 % vaginal cream Apply intravaginally with applicator for 3 nights and topically to vulva then apply topically to vulva only. 20 g 2   No current facility-administered medications for this visit.     Review of Systems General:  Night sweats Skin: no complaints Eyes: vision impairment- awaiting eye doctor appt HEENT: no complaints Breasts: no complaints Pulmonary: no complaints Cardiac: no complaints Gastrointestinal: nausea and vomiting (not currently) Genitourinary/Sexual: urinary incontinence; pain with urination Ob/Gyn: vulvar pain Musculoskeletal: no complaints Hematology: on aspirin and Brillinta Neurologic/Psych: headaches   Objective:  Physical Examination:  BP 116/77   Pulse 66   Temp 97.6 F (36.4 C) (Oral)   Resp 18   Ht 5\' 1"  (1.549 m)   Wt 137 lb (62.1 kg)   BMI 25.89 kg/m     ECOG Performance Status: 1 - Symptomatic but completely ambulatory  GENERAL: Patient is a well appearing female in no acute distress HEENT:  PERRL, neck supple with midline trachea.  NODES:  No cervical, supraclavicular, axillary, or inguinal lymphadenopathy palpated.  LUNGS:  Clear to auscultation bilaterally.  No wheezes or rhonchi. HEART:  Regular rate  and rhythm. No murmur appreciated. ABDOMEN:  Soft, nontender.  Positive, normoactive bowel sounds. Upper abdominal hernia; well healed surgical scar midline abdomen MSK:  No focal spinal tenderness to palpation. Full range of motion bilaterally  in the upper extremities.  EXTREMITIES:  No peripheral edema.   SKIN:  Clear with no obvious rashes or skin changes. No nail dyscrasia. NEURO:  Nonfocal. Well oriented.  Appropriate affect.  Pelvic: EGBUS: very abnormal appearing vulvar with intense erythema involving the bilateral labia and clitoral hood. Defect around the urethra and finding concerning for possible recurrent disease. There is AWE involving the right labia minora consistent with VIN at least.  Diffuse erythema involving the posterior vulva consistent with candidiasis.  Cervix: absent Vagina: no lesions, no discharge or bleeding. Pap obtained Uterus: absent Adnexa: limited exam due to discomfort. Fullness at left vaginal fornix.  Rectovaginal: confirmatory  Lab Review Labs on site today: No labs on site today  Radiologic Imaging: No imaging on site today    Assessment:  Renee Mack is a 52 y.o. female diagnosed with history of stage I vulvar cancer 1.3 mm invasion treated with left radical vulvectomy and bilateral groin node dissection in 05/05/2004; followed by right vulvectomy for microinvasive vulvar cancer 04/2012; MOHS surgery right groin for [facility] 10/04/2012; subsequent VIN2 at least disease on biopsy 03/2017; then lost to follow up. Exam concerning for VIN versus vulvar cancer recurrence.   Currently on anticoagulation with ticagrelor CAD s/p NSTEMI 12/2017 stenting x 5; s/p cardiac catherization 05/22/2018 and percutaneous angioplasty of prior stent.   Symptomatic vulvar candidiasis.   Medical co-morbidities complicating care:History of ST elevation myocardial infarction (STEMI); CAD; type 2 DM; Chronic systolic heart failure ([place]); Ischemic cardiomyopathy; Protein-calorie  malnutrition, severe; COPD.    Plan:   Problem List Items Addressed This Visit      Genitourinary   Vulvar intraepithelial neoplasia (VIN) - Primary   Relevant Orders   Pap liquid-based and HPV (high risk)      We discussed need for vulvar biopsies. She is currently on anti-coagulation and states that she "she bleeds". We tried to reach her cardiologist who is in a procedure. We will determine if the ticagrelor can be held for biopsy if we can convert to Lovenox and then biopsy through a Lovenox window.   Topical lidocaine provided for pain control.   Topical antifungals for vulvar candidiasis.   Follow up Pap results.   Suggested return to clinic in  1-2 weeks once we have a plan to proceed with vulvar biopsy.    The patient's diagnosis, an outline of the further diagnostic and laboratory studies which will be required, the recommendation for surgery, and alternatives were discussed with her and her accompanying family members.  All questions were answered to their satisfaction.  A total of 80 minutes were spent with the patient/family today; >50% was spent in education, counseling and coordination of care for possible recurrent vulvar cancer.   Verlon Au, NP  I personally had a face to face interaction and evaluated the patient jointly with the NP, Ms. Beckey Rutter.  I have reviewed her history and available records and have performed the key portions of the physical exam including ymph node survey,  pelvic exam with my findings confirming those documented above by the APP.  I have discussed the case with the APP and the patient.  I agree with the above documentation, assessment and plan which was fully formulated by me.  Counseling was completed by me.   I personally saw the patient and performed a substantive portion of this encounter in conjunction with the listed APP as documented above.  Tamiko Leopard Gaetana Michaelis, MD    CC:  Olin Hauser, DO  [place], [place] [address] [phone]

## 2018-05-24 ENCOUNTER — Inpatient Hospital Stay: Payer: Medicaid Other | Attending: Obstetrics and Gynecology | Admitting: Obstetrics and Gynecology

## 2018-05-24 ENCOUNTER — Encounter: Payer: Self-pay | Admitting: Obstetrics and Gynecology

## 2018-05-24 VITALS — BP 116/77 | HR 66 | Temp 97.6°F | Resp 18 | Ht 61.0 in | Wt 137.0 lb

## 2018-05-24 DIAGNOSIS — B373 Candidiasis of vulva and vagina: Secondary | ICD-10-CM

## 2018-05-24 DIAGNOSIS — Z90710 Acquired absence of both cervix and uterus: Secondary | ICD-10-CM | POA: Diagnosis not present

## 2018-05-24 DIAGNOSIS — D071 Carcinoma in situ of vulva: Secondary | ICD-10-CM | POA: Insufficient documentation

## 2018-05-24 DIAGNOSIS — C519 Malignant neoplasm of vulva, unspecified: Secondary | ICD-10-CM | POA: Insufficient documentation

## 2018-05-24 DIAGNOSIS — F17210 Nicotine dependence, cigarettes, uncomplicated: Secondary | ICD-10-CM | POA: Diagnosis not present

## 2018-05-24 DIAGNOSIS — N903 Dysplasia of vulva, unspecified: Secondary | ICD-10-CM | POA: Insufficient documentation

## 2018-05-24 MED ORDER — LIDOCAINE HCL 2 % EX GEL: 1.0000 "application " | CUTANEOUS | 0 refills | Status: DC | PRN

## 2018-05-24 MED ORDER — TERCONAZOLE 0.8 % VA CREA: TOPICAL_CREAM | VAGINAL | 2 refills | Status: DC

## 2018-05-24 NOTE — Progress Notes (Signed)
Pt started menstrual cycle at age 52 Had lots of bleeding and periods that last longer than 10 days after having kids. Had hysterectomy but kept ovaries, and 1 fallopian tube. She has frequent UTI and sometimes it is from diabetes and sometimes she just feels pain in her bottom area she states, she does have bladder incontinence

## 2018-05-28 ENCOUNTER — Inpatient Hospital Stay: Payer: Medicaid Other

## 2018-05-28 ENCOUNTER — Encounter: Payer: Self-pay | Admitting: Emergency Medicine

## 2018-05-28 ENCOUNTER — Other Ambulatory Visit: Payer: Self-pay

## 2018-05-28 ENCOUNTER — Inpatient Hospital Stay
Admission: EM | Admit: 2018-05-28 | Discharge: 2018-05-29 | DRG: 637 | Disposition: A | Discharge status: Home / Self Care | Payer: Medicaid Other | Attending: Internal Medicine | Admitting: Internal Medicine

## 2018-05-28 DIAGNOSIS — Z79899 Other long term (current) drug therapy: Secondary | ICD-10-CM

## 2018-05-28 DIAGNOSIS — I110 Hypertensive heart disease with heart failure: Secondary | ICD-10-CM | POA: Diagnosis present

## 2018-05-28 DIAGNOSIS — M47812 Spondylosis without myelopathy or radiculopathy, cervical region: Secondary | ICD-10-CM | POA: Diagnosis present

## 2018-05-28 DIAGNOSIS — I2510 Atherosclerotic heart disease of native coronary artery without angina pectoris: Secondary | ICD-10-CM | POA: Diagnosis present

## 2018-05-28 DIAGNOSIS — Z90710 Acquired absence of both cervix and uterus: Secondary | ICD-10-CM | POA: Diagnosis not present

## 2018-05-28 DIAGNOSIS — E875 Hyperkalemia: Secondary | ICD-10-CM | POA: Diagnosis present

## 2018-05-28 DIAGNOSIS — E1140 Type 2 diabetes mellitus with diabetic neuropathy, unspecified: Secondary | ICD-10-CM

## 2018-05-28 DIAGNOSIS — D72829 Elevated white blood cell count, unspecified: Secondary | ICD-10-CM

## 2018-05-28 DIAGNOSIS — Z91030 Bee allergy status: Secondary | ICD-10-CM

## 2018-05-28 DIAGNOSIS — E1165 Type 2 diabetes mellitus with hyperglycemia: Secondary | ICD-10-CM | POA: Diagnosis not present

## 2018-05-28 DIAGNOSIS — Z955 Presence of coronary angioplasty implant and graft: Secondary | ICD-10-CM

## 2018-05-28 DIAGNOSIS — F418 Other specified anxiety disorders: Secondary | ICD-10-CM

## 2018-05-28 DIAGNOSIS — Z888 Allergy status to other drugs, medicaments and biological substances status: Secondary | ICD-10-CM | POA: Diagnosis not present

## 2018-05-28 DIAGNOSIS — Z832 Family history of diseases of the blood and blood-forming organs and certain disorders involving the immune mechanism: Secondary | ICD-10-CM

## 2018-05-28 DIAGNOSIS — Z7982 Long term (current) use of aspirin: Secondary | ICD-10-CM

## 2018-05-28 DIAGNOSIS — Z8262 Family history of osteoporosis: Secondary | ICD-10-CM

## 2018-05-28 DIAGNOSIS — Z807 Family history of other malignant neoplasms of lymphoid, hematopoietic and related tissues: Secondary | ICD-10-CM

## 2018-05-28 DIAGNOSIS — IMO0002 Reserved for concepts with insufficient information to code with codable children: Secondary | ICD-10-CM

## 2018-05-28 DIAGNOSIS — Z87891 Personal history of nicotine dependence: Secondary | ICD-10-CM

## 2018-05-28 DIAGNOSIS — K219 Gastro-esophageal reflux disease without esophagitis: Secondary | ICD-10-CM | POA: Diagnosis present

## 2018-05-28 DIAGNOSIS — Z7902 Long term (current) use of antithrombotics/antiplatelets: Secondary | ICD-10-CM | POA: Diagnosis not present

## 2018-05-28 DIAGNOSIS — E785 Hyperlipidemia, unspecified: Secondary | ICD-10-CM | POA: Diagnosis present

## 2018-05-28 DIAGNOSIS — Z886 Allergy status to analgesic agent status: Secondary | ICD-10-CM | POA: Diagnosis not present

## 2018-05-28 DIAGNOSIS — F419 Anxiety disorder, unspecified: Secondary | ICD-10-CM | POA: Diagnosis present

## 2018-05-28 DIAGNOSIS — Z885 Allergy status to narcotic agent status: Secondary | ICD-10-CM | POA: Diagnosis not present

## 2018-05-28 DIAGNOSIS — Z9081 Acquired absence of spleen: Secondary | ICD-10-CM | POA: Diagnosis not present

## 2018-05-28 DIAGNOSIS — I252 Old myocardial infarction: Secondary | ICD-10-CM | POA: Diagnosis not present

## 2018-05-28 DIAGNOSIS — Z91041 Radiographic dye allergy status: Secondary | ICD-10-CM | POA: Diagnosis not present

## 2018-05-28 DIAGNOSIS — I255 Ischemic cardiomyopathy: Secondary | ICD-10-CM | POA: Diagnosis present

## 2018-05-28 DIAGNOSIS — K859 Acute pancreatitis without necrosis or infection, unspecified: Secondary | ICD-10-CM | POA: Diagnosis present

## 2018-05-28 DIAGNOSIS — J449 Chronic obstructive pulmonary disease, unspecified: Secondary | ICD-10-CM | POA: Diagnosis present

## 2018-05-28 DIAGNOSIS — I5042 Chronic combined systolic (congestive) and diastolic (congestive) heart failure: Secondary | ICD-10-CM | POA: Diagnosis present

## 2018-05-28 DIAGNOSIS — Z794 Long term (current) use of insulin: Secondary | ICD-10-CM | POA: Diagnosis not present

## 2018-05-28 DIAGNOSIS — Z8249 Family history of ischemic heart disease and other diseases of the circulatory system: Secondary | ICD-10-CM

## 2018-05-28 DIAGNOSIS — N903 Dysplasia of vulva, unspecified: Secondary | ICD-10-CM | POA: Diagnosis present

## 2018-05-28 DIAGNOSIS — M4802 Spinal stenosis, cervical region: Secondary | ICD-10-CM | POA: Diagnosis present

## 2018-05-28 DIAGNOSIS — R739 Hyperglycemia, unspecified: Secondary | ICD-10-CM | POA: Diagnosis present

## 2018-05-28 DIAGNOSIS — M5090 Cervical disc disorder, unspecified, unspecified cervical region: Secondary | ICD-10-CM | POA: Diagnosis present

## 2018-05-28 DIAGNOSIS — I9589 Other hypotension: Secondary | ICD-10-CM | POA: Diagnosis present

## 2018-05-28 DIAGNOSIS — R112 Nausea with vomiting, unspecified: Secondary | ICD-10-CM

## 2018-05-28 LAB — URINALYSIS, COMPLETE (UACMP) WITH MICROSCOPIC
Bilirubin Urine: NEGATIVE
Glucose, UA: >=500 mg/dL — AB
Hgb urine dipstick: NEGATIVE
Ketones, ur: NEGATIVE mg/dL
Nitrite: NEGATIVE
PH: 6 (ref 5.0–8.0)
Protein, ur: NEGATIVE mg/dL
Specific Gravity, Urine: 1.008 (ref 1.005–1.030)

## 2018-05-28 LAB — BASIC METABOLIC PANEL
Anion gap: 9 (ref 5–15)
Anion gap: 6 (ref 5–15)
BUN: 21 mg/dL — ABNORMAL HIGH (ref 6–20)
BUN: 13 mg/dL (ref 6–20)
CO2: 26 mmol/L (ref 22–32)
CO2: 25 mmol/L (ref 22–32)
CREATININE: 0.6 mg/dL (ref 0.44–1.00)
Calcium: 9.1 mg/dL (ref 8.9–10.3)
Calcium: 8.5 mg/dL — ABNORMAL LOW (ref 8.9–10.3)
Chloride: 91 mmol/L — ABNORMAL LOW (ref 98–111)
Chloride: 104 mmol/L (ref 98–111)
Creatinine, Ser: 0.57 mg/dL (ref 0.44–1.00)
GFR calc Af Amer: >60 mL/min (ref >60)
GFR calc non Af Amer: >60 mL/min (ref >60)
GFR calc non Af Amer: >60 mL/min (ref >60)
Glucose, Bld: 521 mg/dL (ref 70–99)
Glucose, Bld: 396 mg/dL — ABNORMAL HIGH (ref 70–99)
Potassium: 5.2 mmol/L — ABNORMAL HIGH (ref 3.5–5.1)
Potassium: 4.6 mmol/L (ref 3.5–5.1)
Sodium: 126 mmol/L — ABNORMAL LOW (ref 135–145)
Sodium: 135 mmol/L (ref 135–145)

## 2018-05-28 LAB — LIPASE, BLOOD: Lipase: 25 U/L (ref 11–51)

## 2018-05-28 LAB — GLUCOSE, CAPILLARY
GLUCOSE-CAPILLARY: 539 mg/dL — AB (ref 70–99)
GLUCOSE-CAPILLARY: 246 mg/dL — AB (ref 70–99)
Glucose-Capillary: 371 mg/dL — ABNORMAL HIGH (ref 70–99)
Glucose-Capillary: 266 mg/dL — ABNORMAL HIGH (ref 70–99)
Glucose-Capillary: 359 mg/dL — ABNORMAL HIGH (ref 70–99)
Glucose-Capillary: 263 mg/dL — ABNORMAL HIGH (ref 70–99)

## 2018-05-28 LAB — CBC
HCT: 46.4 % — ABNORMAL HIGH (ref 36.0–46.0)
Hemoglobin: 15.4 g/dL — ABNORMAL HIGH (ref 12.0–15.0)
MCH: 31.1 pg (ref 26.0–34.0)
MCHC: 33.2 g/dL (ref 30.0–36.0)
MCV: 93.7 fL (ref 80.0–100.0)
NRBC: 0 % (ref 0.0–0.2)
Platelets: 210 10*3/uL (ref 150–400)
RBC: 4.95 MIL/uL (ref 3.87–5.11)
RDW: 13.1 % (ref 11.5–15.5)
WBC: 14.3 10*3/uL — ABNORMAL HIGH (ref 4.0–10.5)

## 2018-05-28 LAB — TROPONIN I: Troponin I: <0.03 ng/mL (ref <0.03)

## 2018-05-28 MED ORDER — ASPIRIN EC 81 MG PO TBEC
81.0000 mg | DELAYED_RELEASE_TABLET | Freq: Every day | ORAL | Status: DC
  Administered 2018-05-28 – 2018-05-29 (×2): 81 mg via ORAL
  Filled 2018-05-28 (×2): qty 1

## 2018-05-28 MED ORDER — POLYETHYLENE GLYCOL 3350 17 G PO PACK: 17.0000 g | PACK | Freq: Every day | ORAL | Status: DC | PRN

## 2018-05-28 MED ORDER — PANTOPRAZOLE SODIUM 40 MG PO TBEC
80.0000 mg | DELAYED_RELEASE_TABLET | Freq: Every day | ORAL | Status: DC
  Administered 2018-05-28: 80 mg via ORAL
  Filled 2018-05-28: qty 2

## 2018-05-28 MED ORDER — SODIUM CHLORIDE 0.9 % IV BOLUS
1000.0000 mL | Freq: Once | INTRAVENOUS | Status: AC
  Administered 2018-05-28: 1000 mL via INTRAVENOUS

## 2018-05-28 MED ORDER — ONDANSETRON HCL 4 MG/2ML IJ SOLN
INTRAMUSCULAR | Status: AC
  Administered 2018-05-28: 4 mg via INTRAVENOUS
  Filled 2018-05-28: qty 2

## 2018-05-28 MED ORDER — ACETAMINOPHEN 325 MG PO TABS: 650.0000 mg | ORAL_TABLET | Freq: Four times a day (QID) | ORAL | Status: DC | PRN

## 2018-05-28 MED ORDER — ONDANSETRON HCL 4 MG/2ML IJ SOLN
4.0000 mg | Freq: Once | INTRAMUSCULAR | Status: AC
  Administered 2018-05-28: 4 mg via INTRAVENOUS
  Filled 2018-05-28: qty 2

## 2018-05-28 MED ORDER — INSULIN GLARGINE 100 UNIT/ML ~~LOC~~ SOLN
70.0000 [IU] | Freq: Every day | SUBCUTANEOUS | Status: DC
  Administered 2018-05-28: 70 [IU] via SUBCUTANEOUS
  Filled 2018-05-28 (×2): qty 0.7

## 2018-05-28 MED ORDER — ENOXAPARIN SODIUM 40 MG/0.4ML ~~LOC~~ SOLN
40.0000 mg | SUBCUTANEOUS | Status: DC
  Administered 2018-05-28: 40 mg via SUBCUTANEOUS
  Filled 2018-05-28: qty 0.4

## 2018-05-28 MED ORDER — INSULIN ASPART 100 UNIT/ML ~~LOC~~ SOLN
10.0000 [IU] | Freq: Once | SUBCUTANEOUS | Status: AC
  Administered 2018-05-28: 10 [IU] via INTRAVENOUS
  Filled 2018-05-28: qty 1

## 2018-05-28 MED ORDER — MORPHINE SULFATE (PF) 4 MG/ML IV SOLN
4.0000 mg | Freq: Once | INTRAVENOUS | Status: AC
  Administered 2018-05-28: 4 mg via INTRAVENOUS

## 2018-05-28 MED ORDER — ALBUTEROL SULFATE (2.5 MG/3ML) 0.083% IN NEBU: 3.0000 mL | INHALATION_SOLUTION | Freq: Four times a day (QID) | RESPIRATORY_TRACT | Status: DC | PRN

## 2018-05-28 MED ORDER — ATORVASTATIN CALCIUM 20 MG PO TABS
80.0000 mg | ORAL_TABLET | Freq: Every day | ORAL | Status: DC
  Administered 2018-05-28: 80 mg via ORAL
  Filled 2018-05-28: qty 4

## 2018-05-28 MED ORDER — TICAGRELOR 90 MG PO TABS
90.0000 mg | ORAL_TABLET | Freq: Two times a day (BID) | ORAL | Status: DC
  Administered 2018-05-28 – 2018-05-29 (×3): 90 mg via ORAL
  Filled 2018-05-28 (×3): qty 1

## 2018-05-28 MED ORDER — OXYCODONE-ACETAMINOPHEN 5-325 MG PO TABS
2.0000 | ORAL_TABLET | ORAL | Status: DC | PRN
  Administered 2018-05-28 – 2018-05-29 (×5): 2 via ORAL
  Filled 2018-05-28 (×5): qty 2

## 2018-05-28 MED ORDER — CITALOPRAM HYDROBROMIDE 20 MG PO TABS
20.0000 mg | ORAL_TABLET | Freq: Every day | ORAL | Status: DC
  Administered 2018-05-28 – 2018-05-29 (×2): 20 mg via ORAL
  Filled 2018-05-28 (×2): qty 1

## 2018-05-28 MED ORDER — ONDANSETRON HCL 4 MG/2ML IJ SOLN
4.0000 mg | Freq: Four times a day (QID) | INTRAMUSCULAR | Status: DC | PRN
  Administered 2018-05-28: 4 mg via INTRAVENOUS
  Filled 2018-05-28: qty 2

## 2018-05-28 MED ORDER — INSULIN ASPART 100 UNIT/ML ~~LOC~~ SOLN
0.0000 [IU] | Freq: Three times a day (TID) | SUBCUTANEOUS | Status: DC
  Administered 2018-05-28: 20 [IU] via SUBCUTANEOUS
  Administered 2018-05-29: 7 [IU] via SUBCUTANEOUS
  Administered 2018-05-29: 15 [IU] via SUBCUTANEOUS
  Filled 2018-05-28 (×3): qty 1

## 2018-05-28 MED ORDER — ONDANSETRON HCL 4 MG PO TABS: 4.0000 mg | ORAL_TABLET | Freq: Four times a day (QID) | ORAL | Status: DC | PRN

## 2018-05-28 MED ORDER — INSULIN ASPART 100 UNIT/ML ~~LOC~~ SOLN
0.0000 [IU] | Freq: Every day | SUBCUTANEOUS | Status: DC
  Administered 2018-05-28: 3 [IU] via SUBCUTANEOUS
  Filled 2018-05-28: qty 1

## 2018-05-28 MED ORDER — ONDANSETRON HCL 4 MG/2ML IJ SOLN
4.0000 mg | Freq: Once | INTRAMUSCULAR | Status: AC
  Administered 2018-05-28: 4 mg via INTRAVENOUS

## 2018-05-28 MED ORDER — MORPHINE SULFATE (PF) 4 MG/ML IV SOLN
INTRAVENOUS | Status: AC
  Filled 2018-05-28: qty 1

## 2018-05-28 MED ORDER — MORPHINE SULFATE (PF) 4 MG/ML IV SOLN
4.0000 mg | Freq: Once | INTRAVENOUS | Status: AC
  Administered 2018-05-28: 4 mg via INTRAVENOUS
  Filled 2018-05-28: qty 1

## 2018-05-28 MED ORDER — LORAZEPAM 1 MG PO TABS
1.0000 mg | ORAL_TABLET | Freq: Two times a day (BID) | ORAL | Status: DC | PRN
  Administered 2018-05-28 – 2018-05-29 (×2): 1 mg via ORAL
  Filled 2018-05-28 (×2): qty 1

## 2018-05-28 MED ORDER — METOPROLOL SUCCINATE ER 25 MG PO TB24: 12.5000 mg | ORAL_TABLET | Freq: Every day | ORAL | Status: DC

## 2018-05-28 MED ORDER — SODIUM CHLORIDE 0.9 % IV SOLN
INTRAVENOUS | Status: DC
  Administered 2018-05-28: 14:00:00 via INTRAVENOUS

## 2018-05-28 MED ORDER — ACETAMINOPHEN 650 MG RE SUPP: 650.0000 mg | Freq: Four times a day (QID) | RECTAL | Status: DC | PRN

## 2018-05-28 NOTE — ED Notes (Signed)
Pt up to the bathroom, able to provide urine sample at this time. Pt back to bed without incident. Pt requesting nausea medication administer per MD order, pt then requesting pain medication. Medication administered per MD order. Pt alert and oriented, visualized in NAD at this time.

## 2018-05-28 NOTE — ED Notes (Signed)
Pt repositioned in bed at this time, pt denies feeling bad, noted continued hypotension.

## 2018-05-28 NOTE — ED Triage Notes (Signed)
Pt to triage via wheelchair. Pt reports hx of pancreatitis and DKA. Pt reports during the night she developed abd pain, n/v and when she checked her blood sugar it was critical high. Took 25 units of her novolog.

## 2018-05-28 NOTE — ED Provider Notes (Signed)
[facility] Emergency Department Provider Note ____________________________________________   I have reviewed the triage vital signs and the triage nursing note.  HISTORY  Chief Complaint Hyperglycemia; Nausea; and Emesis   Historian Patient  HPI Renee Mack is a 51 y.o. female with a history of GERD, pancreatitis, coronary artery disease, with stents, as well as diabetes, reports yesterday started having vomiting and epigastric pain which felt like similar to prior episodes of pancreatitis.  Vomiting watery, nonbloody nonbilious multiple times overnight.  She took her blood sugar and it was elevated above 600.  She took an additional 25 units of NovoLog this morning around 5 or so a.m. and blood sugar has not come down.  Pain is moderate to severe in epigastrium and feels similar to prior episodes of pancreatitis.  No diarrhea.  No fever.  She has a mild chronic cough, but no new cough.  No confusion or altered mental status.  Feels like either DKA or pancreatitis to her.     Past Medical History:  Diagnosis Date  . Cancer ([place])    vulvular  . Cervical disc disease    a. 01/2018 MRI Cervical spine: Cervical spondylosis with multilevel disc and facet degeneration greatest at C5-6 and C6-7.  Moderate to sev R C5-6, mod L C5-6, and mod bilat C6-7 foraminal stenosis with multilevel mild foraminal stenosis.  Mild C5-6 and mod C6-7 canal stenosis. C6-7 central cord impingement w/ cord flattening.  . Chronic combined systolic (congestive) and diastolic (congestive) heart failure ([place])    a. 12/2017 Echo: EF 30-35%, mid-apicalanteroseptal, ant, and apical AK. Gr1 DD. Mild conc LVH; b. 03/2018 Echo: EF 45-50%, antsept, ant HK. Mild MR. Nl LA size. Nl RV fxn.  Marland Kitchen COPD (chronic obstructive pulmonary disease) (HCC)    not on home oxygen  . Coronary artery disease    a. 12/2017 ACS/PCI: LAD 100p (2.25x26 Resolute Onyx DES), 92m (2.0x12 Resolute Onyx DES), 80d (2.0x15  Resolute Onyx DES), RCA 90p (non-dominant). EF 25-35%. Post-MI course complicated by CGS; b. 11/3843 NSTEMI/subacute thrombosis-->LAD 100 (PTCA + DES x 1); c. 01/2018 NSTEMI/PCI: LM min irregs, LAD 50-60p ISR/hazy (3.5x12 Resolute DES), 71m/d (underexpansion of prior stents-->PTCA). EF 45-50%.  . Diabetes mellitus without complication ([place])   . GERD (gastroesophageal reflux disease)   . H/O degenerative disc disease   . Hypotension   . Ischemic cardiomyopathy    a. 12/2017 Echo: EF 30-35%; b. 03/2018 Echo: EF 45-50%.  . Myocardial infarction ([place])    a. 12/2017-->DES to LAD x 3.  . Pancreatitis   . Shingles   . Spinal stenosis   . Tobacco abuse   . Ulcer (traumatic) of oral mucosa   . Urinary incontinence     Patient Active Problem List   Diagnosis Date Noted  . Vulvar intraepithelial neoplasia (VIN) 05/24/2018  . History of ST elevation myocardial infarction (STEMI) 02/27/2018  . CAD (coronary artery disease) 02/27/2018  . Hyperlipidemia associated with type 2 diabetes mellitus ([place]) 02/27/2018  . Anxiety associated with depression 02/27/2018  . Chronic systolic heart failure ([place])   . Chest pain 12/26/2017  . Unstable angina ([place])   . Ischemic cardiomyopathy   . Former smoker   . Protein-calorie malnutrition, severe 07/18/2017  . Uncontrolled type 2 diabetes with neuropathy ([place]) 12/28/2014  . COPD (chronic obstructive pulmonary disease) ([place]) 12/28/2014  . GERD (gastroesophageal reflux disease) 12/28/2014  . History of shingles 12/28/2014  . Hyperlipidemia LDL goal <70 12/28/2014    Past Surgical History:  Procedure  Laterality Date  . ABDOMINAL HYSTERECTOMY     partial  . CORONARY BALLOON ANGIOPLASTY N/A 05/22/2018   Procedure: CORONARY BALLOON ANGIOPLASTY;  Surgeon: Wellington Hampshire, MD;  Location: [place] [facility];  Service: Cardiovascular;  Laterality: N/A;  . CORONARY STENT INTERVENTION N/A 12/26/2017   Procedure: CORONARY STENT INTERVENTION;  Surgeon: Wellington Hampshire, MD;  Location: [place] [facility];  Service: Cardiovascular;  Laterality: N/A;  . CORONARY STENT INTERVENTION N/A 02/08/2018   Procedure: CORONARY STENT INTERVENTION;  Surgeon: Nelva Bush, MD;  Location: [place] [facility];  Service: Cardiovascular;  Laterality: N/A;  . CORONARY/GRAFT ACUTE MI REVASCULARIZATION N/A 12/19/2017   Procedure: Coronary/Graft Acute MI Revascularization;  Surgeon: Wellington Hampshire, MD;  Location: [place] [facility];  Service: Cardiovascular;  Laterality: N/A;  . ECTOPIC PREGNANCY SURGERY Left   . INTRAVASCULAR ULTRASOUND/IVUS N/A 02/08/2018   Procedure: Intravascular Ultrasound/IVUS;  Surgeon: Nelva Bush, MD;  Location: [place] [facility];  Service: Cardiovascular;  Laterality: N/A;  . LEFT HEART CATH AND CORONARY ANGIOGRAPHY N/A 12/19/2017   Procedure: LEFT HEART CATH AND CORONARY ANGIOGRAPHY;  Surgeon: Wellington Hampshire, MD;  Location: [place] [facility];  Service: Cardiovascular;  Laterality: N/A;  . LEFT HEART CATH AND CORONARY ANGIOGRAPHY N/A 12/26/2017   Procedure: LEFT HEART CATH AND CORONARY ANGIOGRAPHY;  Surgeon: Wellington Hampshire, MD;  Location: [place] [facility];  Service: Cardiovascular;  Laterality: N/A;  . LEFT HEART CATH AND CORONARY ANGIOGRAPHY N/A 02/08/2018   Procedure: LEFT HEART CATH AND CORONARY ANGIOGRAPHY;  Surgeon: Nelva Bush, MD;  Location: [place] [facility];  Service: Cardiovascular;  Laterality: N/A;  . LEFT HEART CATH AND CORONARY ANGIOGRAPHY N/A 05/22/2018   Procedure: LEFT HEART CATH AND CORONARY ANGIOGRAPHY;  Surgeon: Wellington Hampshire, MD;  Location: [place] [facility];  Service: Cardiovascular;  Laterality: N/A;  . LYMPHADENECTOMY    . SPLENECTOMY, PARTIAL    . vulvulasectomy      Prior to Admission medications   Medication Sig Start Date End Date Taking? Authorizing Provider  acetaminophen (TYLENOL) 325 MG tablet Take 650 mg by mouth every 6 (six) hours as needed for mild pain.     [provider]  albuterol (PROAIR HFA) 108 (90 Base) MCG/ACT inhaler Inhale 1 puff into the lungs every 6 (six) hours as needed for wheezing or shortness of breath.     [provider]  aspirin 81 MG tablet Take 81 mg by mouth daily.    [provider]  atorvastatin (LIPITOR) 80 MG tablet Take 1 tablet (80 mg total) by mouth daily at 6 PM. 01/10/18 03/25/19  Theora Gianotti, NP  citalopram (CELEXA) 20 MG tablet Take 1 tablet (20 mg total) by mouth daily. 03/29/18   Karamalegos, Devonne Doughty, DO  esomeprazole (NEXIUM) 40 MG capsule Take 40 mg by mouth daily.     [provider]  insulin aspart (NOVOLOG) 100 UNIT/ML injection Inject 14 Units into the skin 3 (three) times daily before meals. Take 10 units PLUS sliding scale Patient taking differently: Inject 25 Units into the skin 3 (three) times daily before meals.  03/29/18   Karamalegos, Devonne Doughty, DO  insulin glargine (LANTUS) 100 UNIT/ML injection Inject 0.7 mLs (70 Units total) into the skin at bedtime. Patient taking differently: Inject 100 Units into the skin at bedtime.  02/27/18   Karamalegos, Devonne Doughty, DO  lidocaine (XYLOCAINE) 2 % jelly Apply 1 application topically as needed. 05/24/18   Beckey Rutter  G, NP  LORazepam (ATIVAN) 1 MG tablet Take 1 tablet (1 mg total) by mouth 2 (two) times daily as needed for anxiety. 02/27/18   Karamalegos, Devonne Doughty, DO  metoprolol succinate (TOPROL-XL) 25 MG 24 hr tablet Take 1 tablet (25 mg total) by mouth daily. 02/21/18   Theora Gianotti, NP  ondansetron (ZOFRAN) 4 MG tablet Take 1 tablet (4 mg total) by mouth daily as needed. 01/05/18   Schaevitz, Randall An, MD  oxyCODONE-acetaminophen (PERCOCET/ROXICET) 5-325 MG tablet Take 1 tablet by mouth every 8 (eight) hours as needed for moderate pain or severe pain (degenerative disc disease; arthritis). 05/23/18   Gladstone Lighter, MD  promethazine (PHENERGAN) 25 MG tablet Take 25 mg by mouth every 6 (six) hours as needed for  nausea. for nausea 05/15/18   [provider]  ranolazine (RANEXA) 500 MG 12 hr tablet Take 1 tablet (500 mg total) by mouth 2 (two) times daily. 03/09/18   Minna Merritts, MD  terconazole (TERAZOL 3) 0.8 % vaginal cream Apply intravaginally with applicator for 3 nights and topically to vulva then apply topically to vulva only. 05/24/18   Verlon Au, NP  ticagrelor (BRILINTA) 90 MG TABS tablet Take 90 mg by mouth 2 (two) times daily.    [provider]    Allergies  Allergen Reactions  . Bee Venom Itching, Shortness Of Breath and Swelling  . Metformin And Related Shortness Of Breath  . Darvon [Propoxyphene] Itching  . Gabapentin Swelling  . Nsaids Other (See Comments)    Ulcers   . Tramadol Hives  . Contrast Media [Iodinated Diagnostic Agents] Rash    If you benadryl, and steroids she is able to take the contrast per pt    Family History  Problem Relation Age of Onset  . Non-Hodgkin's lymphoma Mother   . Osteoporosis Mother   . Lupus Sister   . Heart disease Brother   . Heart attack Brother   . Heart attack Father     Social History Social History   Tobacco Use  . Smoking status: Former Smoker    Packs/day: 0.50    Years: 30.00    Pack years: 15.00    Types: Cigarettes    Last attempt to quit: 12/19/2017    Years since quitting: 0.4  . Smokeless tobacco: Never Used  . Tobacco comment: quit 12/19/2017.  Substance Use Topics  . Alcohol use: No  . Drug use: No    Review of Systems  Constitutional: Negative for fever. Eyes: Negative for visual changes. ENT: Negative for sore throat. Cardiovascular: Negative for chest pain. Respiratory: Negative for shortness of breath. Gastrointestinal: Positive for abdominal pain and vomiting as per HPI.Marland Kitchen Genitourinary: Negative for dysuria. Musculoskeletal: Negative for back pain. Skin: Negative for rash. Neurological: Negative for headache.  ____________________________________________   PHYSICAL  EXAM:  VITAL SIGNS: ED Triage Vitals  Enc Vitals Group     BP 05/28/18 0656 121/75     Pulse Rate 05/28/18 0656 78     Resp 05/28/18 0656 16     Temp 05/28/18 0656 97.7 F (36.5 C)     Temp Source 05/28/18 0656 Oral     SpO2 05/28/18 0656 100 %     Weight 05/28/18 0657 131 lb (59.4 kg)     Height 05/28/18 0657 5\' 1"  (1.549 m)     Head Circumference --      Peak Flow --      Pain Score 05/28/18 0701 8  Pain Loc --      Pain Edu? --      Excl. in [place]? --      Constitutional: Alert and oriented.  HEENT      Head: Normocephalic and atraumatic.      Eyes: Conjunctivae are normal. Pupils equal and round.       Ears:         Nose: No congestion/rhinnorhea.      Mouth/Throat: Mucous membranes are moderately dry.      Neck: No stridor. Cardiovascular/Chest: Normal rate, regular rhythm.  No murmurs, rubs, or gallops. Respiratory: Normal respiratory effort without tachypnea nor retractions. Breath sounds are clear and equal bilaterally. No wheezes/rales/rhonchi. Gastrointestinal: Soft. No distention, no guarding, no rebound.  Tenderness in the upper abdomen down to about mid abdomen.  No lower abdominal tenderness to palpation. Genitourinary/rectal:Deferred Musculoskeletal: Nontender with normal range of motion in all extremities. No joint effusions.  No lower extremity tenderness.  No edema. Neurologic:  Normal speech and language. No gross or focal neurologic deficits are appreciated. Skin:  Skin is warm, dry and intact. No rash noted. Psychiatric: Mood and affect are normal. Speech and behavior are normal. Patient exhibits appropriate insight and judgment.   ____________________________________________  LABS (pertinent positives/negatives) I, Lisa Roca, MD the attending physician have reviewed the labs noted below.  Labs Reviewed  GLUCOSE, CAPILLARY - Abnormal; Notable for the following components:      Result Value   Glucose-Capillary 539 (*)    All other components  within normal limits  BASIC METABOLIC PANEL - Abnormal; Notable for the following components:   Sodium 126 (*)    Potassium 5.2 (*)    Chloride 91 (*)    Glucose, Bld 521 (*)    BUN 21 (*)    All other components within normal limits  CBC - Abnormal; Notable for the following components:   WBC 14.3 (*)    Hemoglobin 15.4 (*)    HCT 46.4 (*)    All other components within normal limits  URINALYSIS, COMPLETE (UACMP) WITH MICROSCOPIC - Abnormal; Notable for the following components:   Color, Urine COLORLESS (*)    APPearance CLEAR (*)    Glucose, UA >=500 (*)    Leukocytes, UA TRACE (*)    Bacteria, UA RARE (*)    All other components within normal limits  GLUCOSE, CAPILLARY - Abnormal; Notable for the following components:   Glucose-Capillary 371 (*)    All other components within normal limits  LIPASE, BLOOD  BLOOD GAS, VENOUS  TROPONIN I  CBG MONITORING, ED    ____________________________________________    EKG I, Lisa Roca, MD, the attending physician have personally viewed and interpreted all ECGs.  66 bpm.  Normal sinus rhythm.  Narrow QRS.  Normal axis.  Normal ST and T wave ____________________________________________  RADIOLOGY   None __________________________________________  PROCEDURES  Procedure(s) performed: None  Procedures  Critical Care performed: None   ____________________________________________  ED COURSE / ASSESSMENT AND PLAN  Pertinent labs & imaging results that were available during my care of the patient were reviewed by me and considered in my medical decision making (see chart for details).    Clinically suspect hyperglycemia plus or minus DKA and possibly pancreatitis based on patient's past history and description of symptoms.  Vital signs are stable.  We will initiate IV fluids for hyperglycemia as well as hydration for what appears to be some clinical dehydration.  I am to treat her clinically with pain and nausea  medicine at present while awaiting laboratory studies.  From an abdominal pain standpoint, I am not highly suspicious for intra-abdominal surgical emergency, and I am holding off on any imaging at this point.  Laboratory studies show hyperglycemia without DKA.  Patient given 2 L normal saline.  Her blood sugar was coming down to the 300s.  I am to go ahead and give her 10 units of insulin.  He continues to have nausea and epigastric pain and nonbloody nonbilious vomiting.  She is getting a second dose of Zofran.  She is going to need hospital management for nausea and vomiting which is intractable.    CONSULTATIONS:  Hospitalist for admission   Patient / Family / Caregiver informed of clinical course, medical decision-making process, and agree with plan.     ___________________________________________   FINAL CLINICAL IMPRESSION(S) / ED DIAGNOSES   Final diagnoses:  Intractable nausea and vomiting  Hyperglycemia      ___________________________________________         Note: This dictation was prepared with Dragon dictation. Any transcriptional errors that result from this process are unintentional    Lisa Roca, MD 05/28/18 1101

## 2018-05-28 NOTE — ED Notes (Signed)
ED TO INPATIENT HANDOFF REPORT  Name/Age/Gender Renee Mack 52 y.o. female  Code Status Code Status History    Date Active Date Inactive Code Status Order ID Comments User Context   05/22/2018 0607 05/23/2018 1837 Full Code [id number]  Harrie Foreman, MD ED   03/24/2018 1305 03/25/2018 1821 Full Code [id number]  Hillary Bow, MD ED   02/07/2018 0630 02/09/2018 1532 Full Code [id number]  Arta Silence, MD ED   01/26/2018 0811 01/28/2018 1803 Full Code [id number]  Harrie Foreman, MD Inpatient   01/15/2018 1342 01/17/2018 1617 Full Code [id number]  Demetrios Loll, MD Inpatient   12/26/2017 1538 12/30/2017 1928 Full Code [id number]  Wellington Hampshire, MD Inpatient   12/26/2017 1537 12/26/2017 1538 Full Code [id number]  Vaughan Basta, MD Inpatient   12/19/2017 0839 12/21/2017 1940 Full Code [id number]  Wellington Hampshire, MD Inpatient   11/03/2017 1031 11/04/2017 1850 Full Code [id number]  Fritzi Mandes, MD Inpatient   09/28/2017 1044 09/29/2017 2121 Full Code [id number]  Harrie Foreman, MD ED   07/17/2017 2004 07/20/2017 1840 Full Code [id number]  Henreitta Leber, MD ED   06/12/2017 2227 06/16/2017 1910 Full Code [id number]  Gorden Harms, MD Inpatient   12/28/2014 0305 12/28/2014 1919 Full Code [id number]  Lance Coon, MD Inpatient      Home/SNF/Other Home  Chief Complaint  elevated blood suigar nauseated abd pain rads into back  Level of Care/Admitting Diagnosis ED Disposition    ED Disposition Condition [place]: [place] [100120]  Level of Care: Telemetry [5]  Diagnosis: Hyperglycemia [762831]  Admitting Physician: Hyman Bible DODD [[id number]]  Attending Physician: Hyman Bible DODD [[id number]]  Estimated length of stay: past midnight tomorrow  Certification:: I certify this patient will need inpatient services for at least 2 midnights  PT Class (Do Not Modify): Inpatient [101]  PT Acc Code (Do Not Modify): Private [1]       Medical  History Past Medical History:  Diagnosis Date  . Cancer ([place])    vulvular  . Cervical disc disease    a. 01/2018 MRI Cervical spine: Cervical spondylosis with multilevel disc and facet degeneration greatest at C5-6 and C6-7.  Moderate to sev R C5-6, mod L C5-6, and mod bilat C6-7 foraminal stenosis with multilevel mild foraminal stenosis.  Mild C5-6 and mod C6-7 canal stenosis. C6-7 central cord impingement w/ cord flattening.  . Chronic combined systolic (congestive) and diastolic (congestive) heart failure ([place])    a. 12/2017 Echo: EF 30-35%, mid-apicalanteroseptal, ant, and apical AK. Gr1 DD. Mild conc LVH; b. 03/2018 Echo: EF 45-50%, antsept, ant HK. Mild MR. Nl LA size. Nl RV fxn.  Marland Kitchen COPD (chronic obstructive pulmonary disease) (HCC)    not on home oxygen  . Coronary artery disease    a. 12/2017 ACS/PCI: LAD 100p (2.25x26 Resolute Onyx DES), 65m (2.0x12 Resolute Onyx DES), 80d (2.0x15 Resolute Onyx DES), RCA 90p (non-dominant). EF 25-35%. Post-MI course complicated by CGS; b. 11/9483 NSTEMI/subacute thrombosis-->LAD 100 (PTCA + DES x 1); c. 01/2018 NSTEMI/PCI: LM min irregs, LAD 50-60p ISR/hazy (3.5x12 Resolute DES), 50m/d (underexpansion of prior stents-->PTCA). EF 45-50%.  . Diabetes mellitus without complication ([place])   . GERD (gastroesophageal reflux disease)   . H/O degenerative disc disease   . Hypotension   . Ischemic cardiomyopathy    a. 12/2017 Echo: EF 30-35%; b. 03/2018 Echo: EF 45-50%.  . Myocardial infarction ([place])    a. 12/2017-->DES to LAD x 3.  Marland Kitchen  Pancreatitis   . Shingles   . Spinal stenosis   . Tobacco abuse   . Ulcer (traumatic) of oral mucosa   . Urinary incontinence     Allergies Allergies  Allergen Reactions  . Bee Venom Itching, Shortness Of Breath and Swelling  . Metformin And Related Shortness Of Breath  . Darvon [Propoxyphene] Itching  . Gabapentin Swelling  . Nsaids Other (See Comments)    Ulcers   . Tramadol Hives  . Contrast Media [Iodinated  Diagnostic Agents] Rash    If you benadryl, and steroids she is able to take the contrast per pt    IV Location/Drains/Wounds Patient Lines/Drains/Airways Status   Active Line/Drains/Airways    Name:   Placement date:   Placement time:   Site:   Days:   Peripheral IV 05/28/18 Right Antecubital   05/28/18    0839    Antecubital   less than 1          Labs/Imaging Results for orders placed or performed during the hospital encounter of 05/28/18 (from the past 48 hour(s))  Glucose, capillary     Status: Abnormal   Collection Time: 05/28/18  6:55 AM  Result Value Ref Range   Glucose-Capillary 539 (HH) 70 - 99 mg/dL  Basic metabolic panel     Status: Abnormal   Collection Time: 05/28/18  7:44 AM  Result Value Ref Range   Sodium 126 (L) 135 - 145 mmol/L   Potassium 5.2 (H) 3.5 - 5.1 mmol/L   Chloride 91 (L) 98 - 111 mmol/L   CO2 26 22 - 32 mmol/L   Glucose, Bld 521 (HH) 70 - 99 mg/dL    Comment: CRITICAL RESULT CALLED TO, READ BACK BY AND VERIFIED WITH Caron Ode @0853  05/28/2018 TTG    BUN 21 (H) 6 - 20 mg/dL   Creatinine, Ser 0.57 0.44 - 1.00 mg/dL   Calcium 9.1 8.9 - 10.3 mg/dL   GFR calc non Af Amer >60 >60 mL/min   GFR calc Af Amer >60 >60 mL/min   Anion gap 9 5 - 15    Comment: Performed at [facility], [address] [place], [place] [address]  CBC     Status: Abnormal   Collection Time: 05/28/18  7:44 AM  Result Value Ref Range   WBC 14.3 (H) 4.0 - 10.5 K/uL   RBC 4.95 3.87 - 5.11 MIL/uL   Hemoglobin 15.4 (H) 12.0 - 15.0 g/dL   HCT 46.4 (H) 36.0 - 46.0 %   MCV 93.7 80.0 - 100.0 fL   MCH 31.1 26.0 - 34.0 pg   MCHC 33.2 30.0 - 36.0 g/dL   RDW 13.1 11.5 - 15.5 %   Platelets 210 150 - 400 K/uL   nRBC 0.0 0.0 - 0.2 %    Comment: Performed at [facility], [address] [place], [place] [address]  Urinalysis, Complete w Microscopic     Status: Abnormal   Collection Time: 05/28/18  7:44 AM  Result Value Ref Range   Color, Urine COLORLESS (A)  YELLOW   APPearance CLEAR (A) CLEAR   Specific Gravity, Urine 1.008 1.005 - 1.030   pH 6.0 5.0 - 8.0   Glucose, UA >=500 (A) NEGATIVE mg/dL   Hgb urine dipstick NEGATIVE NEGATIVE   Bilirubin Urine NEGATIVE NEGATIVE   Ketones, ur NEGATIVE NEGATIVE mg/dL   Protein, ur NEGATIVE NEGATIVE mg/dL   Nitrite NEGATIVE NEGATIVE   Leukocytes, UA TRACE (A) NEGATIVE   RBC / HPF 0-5  0 - 5 RBC/hpf   WBC, UA 0-5 0 - 5 WBC/hpf   Bacteria, UA RARE (A) NONE SEEN   Squamous Epithelial / LPF 0-5 0 - 5   Mucus PRESENT     Comment: Performed at [facility], [address] [place], [place] [address]  Lipase, blood     Status: None   Collection Time: 05/28/18  7:44 AM  Result Value Ref Range   Lipase 25 11 - 51 U/L    Comment: Performed at [facility], [address] [place], [place] [address]  Troponin I - Add-On to previous collection     Status: None   Collection Time: 05/28/18  7:44 AM  Result Value Ref Range   Troponin I <0.03 <0.03 ng/mL    Comment: Performed at [facility] [address] [place], [place] [address]  Blood gas, venous     Status: None (Preliminary result)   Collection Time: 05/28/18  8:41 AM  Result Value Ref Range   pH, Ven 7.36 7.250 - 7.430   pCO2, Ven 49 44.0 - 60.0 mmHg   pO2, Ven PENDING 32.0 - 45.0 mmHg   Bicarbonate 27.7 20.0 - 28.0 mmol/L   Acid-Base Excess 1.5 0.0 - 2.0 mmol/L   O2 Saturation 44.7 %   Patient temperature 37.0    Collection site VEIN    Sample type VEIN     Comment: Performed at [facility], [address] [place],  [address]  Glucose, capillary     Status: Abnormal   Collection Time: 05/28/18 10:07 AM  Result Value Ref Range   Glucose-Capillary 371 (H) 70 - 99 mg/dL  Glucose, capillary     Status: Abnormal   Collection Time: 05/28/18 11:40 AM  Result Value Ref Range   Glucose-Capillary 266 (H) 70 - 99 mg/dL   No results found.  Pending Labs [employer] (From admission, onward)     Start     Ordered   Signed and Held  CBC  (enoxaparin (LOVENOX)    CrCl >/= 30 ml/min)  Once,   R    Comments:  Baseline for enoxaparin therapy IF NOT ALREADY DRAWN.  Notify MD if PLT < 100 K.    Signed and Held   Signed and Held  Creatinine, serum  (enoxaparin (LOVENOX)    CrCl >/= 30 ml/min)  Once,   R    Comments:  Baseline for enoxaparin therapy IF NOT ALREADY DRAWN.    Signed and Held   Signed and Held  Creatinine, serum  (enoxaparin (LOVENOX)    CrCl >/= 30 ml/min)  Weekly,   R    Comments:  while on enoxaparin therapy    Signed and Held   Signed and Held  Basic metabolic panel  Tomorrow morning,   R     Signed and Held   Signed and Held  CBC  Tomorrow morning,   R     Signed and Held   Signed and Held  Basic metabolic panel  Once,   R     Signed and Held          Vitals/Pain Today's Vitals   05/28/18 1116 05/28/18 1130 05/28/18 1215 05/28/18 1245  BP: (!) 91/58 (!) 86/51 (!) 88/58 (!) 85/54  Pulse: 63 (!) 59 (!) 56 (!) 56  Resp: 15 17 15 16   Temp:      TempSrc:      SpO2: 94% 95% 96% 96%  Weight:  Height:      PainSc:        Isolation Precautions No active isolations  Medications Medications  sodium chloride 0.9 % bolus 1,000 mL (0 mLs Intravenous Stopped 05/28/18 1110)  morphine 4 MG/ML injection 4 mg (4 mg Intravenous Given 05/28/18 0839)  ondansetron (ZOFRAN) injection 4 mg (4 mg Intravenous Given 05/28/18 0839)  sodium chloride 0.9 % bolus 1,000 mL (0 mLs Intravenous Stopped 05/28/18 1110)  insulin aspart (novoLOG) injection 10 Units (10 Units Intravenous Given 05/28/18 1028)  ondansetron (ZOFRAN) injection 4 mg (4 mg Intravenous Given 05/28/18 1033)  morphine 4 MG/ML injection 4 mg (4 mg Intravenous Given 05/28/18 1040)  sodium chloride 0.9 % bolus 1,000 mL (1,000 mLs Intravenous New Bag/Given 05/28/18 1142)    Mobility walks

## 2018-05-28 NOTE — ED Notes (Signed)
Pt noted to continue to be hypotensive, however, upon this RN arrival to room pt awakens easily, states she is "sleeping good". Pt continues to receive fluid bolus at this time.

## 2018-05-28 NOTE — ED Notes (Signed)
Dr. Brett Albino made aware of patient condition at this time.

## 2018-05-28 NOTE — Plan of Care (Signed)
  Problem: Education: Goal: Knowledge of General Education information will improve Description: Including pain rating scale, medication(s)/side effects and non-pharmacologic comfort measures Outcome: Progressing   Problem: Health Behavior/Discharge Planning: Goal: Ability to manage health-related needs will improve Outcome: Progressing   Problem: Skin Integrity: Goal: Risk for impaired skin integrity will decrease Outcome: Progressing   

## 2018-05-28 NOTE — ED Notes (Signed)
Admitting provider at bedside to assess patient.

## 2018-05-28 NOTE — H&P (Signed)
[facility] [place] NAME: Renee Mack    MR#:  [id number]  DATE OF BIRTH:  [birth year]  DATE OF ADMISSION:  05/28/2018  PRIMARY CARE PHYSICIAN: Olin Hauser, DO   REQUESTING/REFERRING PHYSICIAN: Lisa Roca, MD  CHIEF COMPLAINT:   Chief Complaint  Patient presents with  . Hyperglycemia  . Nausea  . Emesis    HISTORY OF PRESENT ILLNESS:  Renee Mack  is a 52 y.o. female with a known history of chronic systolic heart failure, COPD, CAD status post stents, type 2 diabetes, chronic hypertension, tobacco use who presented to the ED with nausea and vomiting that started last night.  She also noted that her blood sugars were in the 600s this morning.  She has been taking her insulin as prescribed.  She endorses polyuria and polydipsia.  She denies any fevers, chills, chest pain, shortness of breath, dysuria, urinary urgency, urinary frequency.  She endorses some mild epigastric abdominal pain, but states that this always happens when she has a lot of vomiting.  In the ED, she had some hypotension with BPs in the 80s over 50s.  Labs were significant for K5.2, glucose 521, anion gap 9, WBC 14.3.  She was given Novolog 10 units. She was given 3 L normal saline bolus.  Hospitalists were called for admission.  PAST MEDICAL HISTORY:   Past Medical History:  Diagnosis Date  . Cancer ([place])    vulvular  . Cervical disc disease    a. 01/2018 MRI Cervical spine: Cervical spondylosis with multilevel disc and facet degeneration greatest at C5-6 and C6-7.  Moderate to sev R C5-6, mod L C5-6, and mod bilat C6-7 foraminal stenosis with multilevel mild foraminal stenosis.  Mild C5-6 and mod C6-7 canal stenosis. C6-7 central cord impingement w/ cord flattening.  . Chronic combined systolic (congestive) and diastolic (congestive) heart failure ([place])    a. 12/2017 Echo: EF 30-35%, mid-apicalanteroseptal, ant, and apical AK. Gr1 DD. Mild conc LVH; b.  03/2018 Echo: EF 45-50%, antsept, ant HK. Mild MR. Nl LA size. Nl RV fxn.  Marland Kitchen COPD (chronic obstructive pulmonary disease) (H[facility])    not on home oxygen  . Coronary artery disease    a. 12/2017 ACS/PCI: LAD 100p (2.25x26 Resolute Onyx DES), 28m (2.0x12 Resolute Onyx DES), 80d (2.0x15 Resolute Onyx DES), RCA 90p (non-dominant). EF 25-35%. Post-MI course complicated by CGS; b. 08/5007 NSTEMI/subacute thrombosis-->LAD 100 (PTCA + DES x 1); c. 01/2018 NSTEMI/PCI: LM min irregs, LAD 50-60p ISR/hazy (3.5x12 Resolute DES), 28m/d (underexpansion of prior stents-->PTCA). EF 45-50%.  . Diabetes mellitus without complication ([place])   . GERD (gastroesophageal reflux disease)   . H/O degenerative disc disease   . Hypotension   . Ischemic cardiomyopathy    a. 12/2017 Echo: EF 30-35%; b. 03/2018 Echo: EF 45-50%.  . Myocardial infarction ([place])    a. 12/2017-->DES to LAD x 3.  . Pancreatitis   . Shingles   . Spinal stenosis   . Tobacco abuse   . Ulcer (traumatic) of oral mucosa   . Urinary incontinence     PAST SURGICAL HISTORY:   Past Surgical History:  Procedure Laterality Date  . ABDOMINAL HYSTERECTOMY     partial  . CORONARY BALLOON ANGIOPLASTY N/A 05/22/2018   Procedure: CORONARY BALLOON ANGIOPLASTY;  Surgeon: Wellington Hampshire, MD;  Location: [facility];  Service: Cardiovascular;  Laterality: N/A;  . CORONARY STENT INTERVENTION N/A 12/26/2017   Procedure: CORONARY STENT INTERVENTION;  Surgeon:  Wellington Hampshire, MD;  Location: [facility];  Service: Cardiovascular;  Laterality: N/A;  . CORONARY STENT INTERVENTION N/A 02/08/2018   Procedure: CORONARY STENT INTERVENTION;  Surgeon: Nelva Bush, MD;  Location: [facility];  Service: Cardiovascular;  Laterality: N/A;  . CORONARY/GRAFT ACUTE MI REVASCULARIZATION N/A 12/19/2017   Procedure: Coronary/Graft Acute MI Revascularization;  Surgeon: Wellington Hampshire, MD;  Location: [facility];  Service: Cardiovascular;   Laterality: N/A;  . ECTOPIC PREGNANCY SURGERY Left   . INTRAVASCULAR ULTRASOUND/IVUS N/A 02/08/2018   Procedure: Intravascular Ultrasound/IVUS;  Surgeon: Nelva Bush, MD;  Location: [facility];  Service: Cardiovascular;  Laterality: N/A;  . LEFT HEART CATH AND CORONARY ANGIOGRAPHY N/A 12/19/2017   Procedure: LEFT HEART CATH AND CORONARY ANGIOGRAPHY;  Surgeon: Wellington Hampshire, MD;  Location: [facility];  Service: Cardiovascular;  Laterality: N/A;  . LEFT HEART CATH AND CORONARY ANGIOGRAPHY N/A 12/26/2017   Procedure: LEFT HEART CATH AND CORONARY ANGIOGRAPHY;  Surgeon: Wellington Hampshire, MD;  Location: [facility];  Service: Cardiovascular;  Laterality: N/A;  . LEFT HEART CATH AND CORONARY ANGIOGRAPHY N/A 02/08/2018   Procedure: LEFT HEART CATH AND CORONARY ANGIOGRAPHY;  Surgeon: Nelva Bush, MD;  Location: [facility];  Service: Cardiovascular;  Laterality: N/A;  . LEFT HEART CATH AND CORONARY ANGIOGRAPHY N/A 05/22/2018   Procedure: LEFT HEART CATH AND CORONARY ANGIOGRAPHY;  Surgeon: Wellington Hampshire, MD;  Location: [facility];  Service: Cardiovascular;  Laterality: N/A;  . LYMPHADENECTOMY    . SPLENECTOMY, PARTIAL    . vulvulasectomy      SOCIAL HISTORY:   Social History   Tobacco Use  . Smoking status: Former Smoker    Packs/day: 0.50    Years: 30.00    Pack years: 15.00    Types: Cigarettes    Last attempt to quit: 12/19/2017    Years since quitting: 0.4  . Smokeless tobacco: Never Used  . Tobacco comment: quit 12/19/2017.  Substance Use Topics  . Alcohol use: No    FAMILY HISTORY:   Family History  Problem Relation Age of Onset  . Non-Hodgkin's lymphoma Mother   . Osteoporosis Mother   . Lupus Sister   . Heart disease Brother   . Heart attack Brother   . Heart attack Father     DRUG ALLERGIES:   Allergies  Allergen Reactions  . Bee Venom Itching, Shortness Of Breath and Swelling  . Metformin And Related Shortness  Of Breath  . Darvon [Propoxyphene] Itching  . Gabapentin Swelling  . Nsaids Other (See Comments)    Ulcers   . Tramadol Hives  . Contrast Media [Iodinated Diagnostic Agents] Rash    If you benadryl, and steroids she is able to take the contrast per pt    REVIEW OF SYSTEMS:   Review of Systems  Constitutional: Negative for chills and fever.  HENT: Negative for congestion and sore throat.   Eyes: Negative for blurred vision and double vision.  Respiratory: Negative for cough and shortness of breath.   Cardiovascular: Negative for chest pain, palpitations and leg swelling.  Gastrointestinal: Positive for abdominal pain, nausea and vomiting. Negative for constipation and diarrhea.  Genitourinary: Negative for dysuria, frequency and urgency.  Musculoskeletal: Positive for back pain. Negative for neck pain.  Neurological: Negative for dizziness and headaches.  Psychiatric/Behavioral: Negative for depression. The patient is not nervous/anxious.     MEDICATIONS AT HOME:   Prior to Admission medications  Medication Sig Start Date End Date Taking? Authorizing Provider  acetaminophen (TYLENOL) 325 MG tablet Take 650 mg by mouth every 6 (six) hours as needed for mild pain.     [provider]  albuterol (PROAIR HFA) 108 (90 Base) MCG/ACT inhaler Inhale 1 puff into the lungs every 6 (six) hours as needed for wheezing or shortness of breath.     [provider]  aspirin 81 MG tablet Take 81 mg by mouth daily.    [provider]  atorvastatin (LIPITOR) 80 MG tablet Take 1 tablet (80 mg total) by mouth daily at 6 PM. 01/10/18 03/25/19  Theora Gianotti, NP  citalopram (CELEXA) 20 MG tablet Take 1 tablet (20 mg total) by mouth daily. 03/29/18   Karamalegos, Devonne Doughty, DO  esomeprazole (NEXIUM) 40 MG capsule Take 40 mg by mouth daily.     [provider]  insulin aspart (NOVOLOG) 100 UNIT/ML injection Inject 14 Units into the skin 3 (three) times daily  before meals. Take 10 units PLUS sliding scale Patient taking differently: Inject 25 Units into the skin 3 (three) times daily before meals.  03/29/18   Karamalegos, Devonne Doughty, DO  insulin glargine (LANTUS) 100 UNIT/ML injection Inject 0.7 mLs (70 Units total) into the skin at bedtime. Patient taking differently: Inject 100 Units into the skin at bedtime.  02/27/18   Karamalegos, Devonne Doughty, DO  lidocaine (XYLOCAINE) 2 % jelly Apply 1 application topically as needed. 05/24/18   Verlon Au, NP  LORazepam (ATIVAN) 1 MG tablet Take 1 tablet (1 mg total) by mouth 2 (two) times daily as needed for anxiety. 02/27/18   Karamalegos, Devonne Doughty, DO  metoprolol succinate (TOPROL-XL) 25 MG 24 hr tablet Take 1 tablet (25 mg total) by mouth daily. 02/21/18   Theora Gianotti, NP  ondansetron (ZOFRAN) 4 MG tablet Take 1 tablet (4 mg total) by mouth daily as needed. 01/05/18   Schaevitz, Randall An, MD  oxyCODONE-acetaminophen (PERCOCET/ROXICET) 5-325 MG tablet Take 1 tablet by mouth every 8 (eight) hours as needed for moderate pain or severe pain (degenerative disc disease; arthritis). 05/23/18   Gladstone Lighter, MD  promethazine (PHENERGAN) 25 MG tablet Take 25 mg by mouth every 6 (six) hours as needed for nausea. for nausea 05/15/18   [provider]  ranolazine (RANEXA) 500 MG 12 hr tablet Take 1 tablet (500 mg total) by mouth 2 (two) times daily. 03/09/18   Minna Merritts, MD  terconazole (TERAZOL 3) 0.8 % vaginal cream Apply intravaginally with applicator for 3 nights and topically to vulva then apply topically to vulva only. 05/24/18   Verlon Au, NP  ticagrelor (BRILINTA) 90 MG TABS tablet Take 90 mg by mouth 2 (two) times daily.    [provider]      VITAL SIGNS:  Blood pressure 115/77, pulse 61, temperature 97.7 F (36.5 C), temperature source Oral, resp. rate 18, height 5\' 1"  (1.549 m), weight 59.4 kg, SpO2 94 %.  PHYSICAL EXAMINATION:  Physical  Exam  GENERAL:  52 y.o.-year-old patient lying in the bed with no acute distress.  EYES: Pupils equal, round, reactive to light and accommodation. No scleral icterus. Extraocular muscles intact.  HEENT: Head atraumatic, normocephalic. Oropharynx and nasopharynx clear.  Dry mucous membranes. NECK:  Supple, no jugular venous distention. No thyroid enlargement, no tenderness.  LUNGS: Normal breath sounds bilaterally, no wheezing, rales,rhonchi or crepitation. No use of accessory muscles of respiration.  CARDIOVASCULAR: Tachycardic, regular rhythm.  S1, S2  normal. No murmurs, rubs, or gallops.  ABDOMEN: Soft, nondistended. Bowel sounds present. No organomegaly or mass. + Mild tenderness to palpation of the epigastrium EXTREMITIES: No pedal edema, cyanosis, or clubbing.  NEUROLOGIC: Cranial nerves II through XII are intact. Muscle strength 5/5 in all extremities. Sensation intact. Gait not checked.  PSYCHIATRIC: The patient is alert and oriented x 3.  SKIN: No obvious rash, lesion, or ulcer.   LABORATORY PANEL:   CBC Recent Labs  Lab 05/28/18 0744  WBC 14.3*  HGB 15.4*  HCT 46.4*  PLT 210   ------------------------------------------------------------------------------------------------------------------  Chemistries  Recent Labs  Lab 05/22/18 0338  05/28/18 0744  NA 132*   < > 126*  K 4.1   < > 5.2*  CL 97*   < > 91*  CO2 24   < > 26  GLUCOSE 532*   < > 521*  BUN 17   < > 21*  CREATININE 0.59   < > 0.57  CALCIUM 9.4   < > 9.1  AST 11*  --   --   ALT 16  --   --   ALKPHOS 79  --   --   BILITOT 0.7  --   --    < > = values in this interval not displayed.   ------------------------------------------------------------------------------------------------------------------  Cardiac Enzymes Recent Labs  Lab 05/28/18 0744  TROPONINI <0.03   ------------------------------------------------------------------------------------------------------------------  RADIOLOGY:  No  results found.    IMPRESSION AND PLAN:   Hyperglycemia in type 2 diabetes- glucose 521 in the ED. No anion gap or signs of DKA.  Recent A1c 11.9. -Received 3 L normal saline bolus, will hold off on additional fluids given history of CHF -Lantus 70 units nightly -Resistant SSI  Intractable nausea and vomiting- likely related to hyperglycemia. -IV antiemetics -Hold off on additional fluids -Encourage p.o. intake  Hypotension- chronic issue for her.  MAPs in the high 60s.  Asymptomatic. -Hold home metoprolol for now, can likely restart tomorrow at decreased dose  Leukocytosis- UA without signs of infection.  Patient denies any infectious symptoms.  May be due to stress response in combination with dehydration. -Check CXR to rule out pneumonia -Check procalcitonin -Recheck WBC in the morning  Hyperkalemia-likely related to hyperglycemia. Cr normal. -Fluids and insulin -Recheck in the morning  Chronic systolic heart failure- recent ECHO with EF 45-50%.  No signs of volume overload. -Will need to monitor closely after 3 L normal saline bolus  CAD with history of STEMI- no active chest pain -Continue home aspirin, brilinta, and lipitor -Hold home ranexa due to hypotension  Chronic low back pain- has history of degenerative disc disease and spinal stenosis. -Continue home percocet  Hyperlipidemia- stable -Continue home lipitor  COPD- stable, no signs of acute exacerbation -Albuterol prn  Depression/anxiety- stable -Continue home celexa and ativan   All the records are reviewed and case discussed with ED provider. Management plans discussed with the patient, family and they are in agreement.  CODE STATUS: Full  TOTAL TIME TAKING CARE OF THIS PATIENT: 45 minutes.    Berna Spare Yarlin Breisch M.D on 05/28/2018 at 10:50 AM  Between 7am to 6pm - Pager - [phone]  After 6pm go to [email or web address] - Proofreader  [facility]  Office   [phone]  CC: Primary care physician; Olin Hauser, DO   Note: This dictation was prepared with Dragon dictation along with smaller phrase technology. Any transcriptional errors that result from this process are unintentional.

## 2018-05-28 NOTE — ED Notes (Signed)
[name] BP noted to be 88/53, EDP Paduchowski made awre, VORB for 1L NS bolus. Pt mains alert and oriented and visualized in NAD at this time.

## 2018-05-29 ENCOUNTER — Ambulatory Visit: Payer: Medicaid Other | Admitting: Family Medicine

## 2018-05-29 ENCOUNTER — Telehealth: Payer: Self-pay | Admitting: Nurse Practitioner

## 2018-05-29 LAB — BLOOD GAS, VENOUS
ACID-BASE EXCESS: 1.5 mmol/L (ref 0.0–2.0)
Bicarbonate: 27.7 mmol/L (ref 20.0–28.0)
O2 Saturation: 44.7 %
Patient temperature: 37
pCO2, Ven: 49 mmHg (ref 44.0–60.0)
pH, Ven: 7.36 (ref 7.250–7.430)

## 2018-05-29 LAB — BASIC METABOLIC PANEL
Anion gap: 6 (ref 5–15)
BUN: 15 mg/dL (ref 6–20)
CALCIUM: 8.4 mg/dL — AB (ref 8.9–10.3)
CO2: 25 mmol/L (ref 22–32)
Chloride: 105 mmol/L (ref 98–111)
Creatinine, Ser: 0.4 mg/dL — ABNORMAL LOW (ref 0.44–1.00)
GFR calc Af Amer: >60 mL/min (ref >60)
Glucose, Bld: 325 mg/dL — ABNORMAL HIGH (ref 70–99)
Potassium: 4.2 mmol/L (ref 3.5–5.1)
Sodium: 136 mmol/L (ref 135–145)

## 2018-05-29 LAB — GLUCOSE, CAPILLARY
Glucose-Capillary: 313 mg/dL — ABNORMAL HIGH (ref 70–99)
Glucose-Capillary: 320 mg/dL — ABNORMAL HIGH (ref 70–99)
Glucose-Capillary: 230 mg/dL — ABNORMAL HIGH (ref 70–99)

## 2018-05-29 LAB — CBC
HCT: 42.7 % (ref 36.0–46.0)
Hemoglobin: 13.8 g/dL (ref 12.0–15.0)
MCH: 30.9 pg (ref 26.0–34.0)
MCHC: 32.3 g/dL (ref 30.0–36.0)
MCV: 95.7 fL (ref 80.0–100.0)
PLATELETS: 207 10*3/uL (ref 150–400)
RBC: 4.46 MIL/uL (ref 3.87–5.11)
RDW: 13.2 % (ref 11.5–15.5)
WBC: 7.2 10*3/uL (ref 4.0–10.5)
nRBC: 0 % (ref 0.0–0.2)

## 2018-05-29 MED ORDER — INSULIN GLARGINE 100 UNIT/ML ~~LOC~~ SOLN
50.0000 [IU] | Freq: Two times a day (BID) | SUBCUTANEOUS | Status: DC
  Filled 2018-05-29: qty 0.5

## 2018-05-29 MED ORDER — INSULIN ASPART 100 UNIT/ML ~~LOC~~ SOLN: 3.0000 [IU] | Freq: Three times a day (TID) | SUBCUTANEOUS | Status: DC

## 2018-05-29 MED ORDER — INSULIN ASPART 100 UNIT/ML ~~LOC~~ SOLN: 25.0000 [IU] | Freq: Three times a day (TID) | SUBCUTANEOUS | 1 refills | Status: DC

## 2018-05-29 MED ORDER — METOPROLOL SUCCINATE ER 25 MG PO TB24: 12.5000 mg | ORAL_TABLET | Freq: Every day | ORAL | 0 refills | Status: DC

## 2018-05-29 MED ORDER — INSULIN GLARGINE 100 UNIT/ML ~~LOC~~ SOLN: 100.0000 [IU] | Freq: Every day | SUBCUTANEOUS | 1 refills | Status: DC

## 2018-05-29 MED ORDER — INSULIN ASPART 100 UNIT/ML ~~LOC~~ SOLN
8.0000 [IU] | Freq: Three times a day (TID) | SUBCUTANEOUS | Status: DC
  Administered 2018-05-29: 8 [IU] via SUBCUTANEOUS
  Filled 2018-05-29: qty 1

## 2018-05-29 MED ORDER — INSULIN ASPART 100 UNIT/ML IV SOLN: 3.0000 [IU] | Freq: Three times a day (TID) | INTRAVENOUS | Status: DC

## 2018-05-29 NOTE — Telephone Encounter (Signed)
Patient seen by Dr. Theora Gianotti on 05/24/18 with recommendation for vulvar biopsy. She is on dual anti-platelet therapy and biopsy was deferred pending discussion with cardiology. I reached out to Christell Faith, [place] [facility], who included Dr. Fletcher Anon in conversation. Per Dr. Fletcher Anon, risk of holding dual antiplatelet outweighs risk of bleeding given her recent cath and hx of stents & nstemi.  Discussed with Dr. Theora Gianotti who requests we discuss with patient and schedule for 05/31/18 for possible biopsy.  I've called patient to discuss, no answer, left message to return call.

## 2018-05-29 NOTE — Progress Notes (Addendum)
Inpatient Diabetes Program Recommendations  AACE/ADA: New Consensus Statement on Inpatient Glycemic Control (2015)  Target Ranges:  Prepandial:   less than 140 mg/dL      Peak postprandial:   less than 180 mg/dL (1-2 hours)      Critically ill patients:  140 - 180 mg/dL   Results for LAURIS, KEEPERS (MRN [id number]) as of 05/29/2018 09:48  Ref. Range 05/28/2018 06:55 05/28/2018 10:07 05/28/2018 11:40 05/28/2018 13:51 05/28/2018 16:18 05/28/2018 20:51  Glucose-Capillary Latest Ref Range: 70 - 99 mg/dL 539 (HH) 371 (H)  10 units NOVOLOG  266 (H) 246 (H) 359 (H)  20 units NOVOLOG  263 (H)  3 units NOVOLOG +  70 units LANTUS   Results for KIMLA, FURTH (MRN [id number]) as of 05/29/2018 09:48  Ref. Range 05/29/2018 02:21 05/29/2018 07:33  Glucose-Capillary Latest Ref Range: 70 - 99 mg/dL 313 (H) 320 (H)  15 units NOVOLOG    Results for KHARTER, SESTAK (MRN [id number]) as of 05/29/2018 09:48  Ref. Range 12/19/2017 09:34 03/24/2018 15:50 05/22/2018 03:38  Hemoglobin A1C Latest Ref Range: 4.8 - 5.6 % 12.1 (H) 11.9 (H) 11.9 (H)     Admit with: Hyperglycemia (glucose 521 mg/dl on admit)  History: DM, COPD, CHF  Home DM Meds: Novolog 25 units TID       Lantus 100 units QHS  Current Orders: Lantus 70 units QHS      Novolog Resistant Correction Scale/ SSI (0-20 units) TID AC + HS      Novolog 3 units TID with meals     Counseled by the DM Coordinator on 12/20/2017 and 05/23/2018.   Patient seen by her Endocrinology Practice on 04/20/2018--At that visit, patient was instructed to Increase her Lantus to 100 units Daily and Increase her Novolog to 25 units TID with meals.     MD- Note Novolog 3 units Meal Coverage started today--Will get 1st dose at 12pm today.  Not sure if this will be enough given pt takes Novolog 25 units TID with meals at home.  Please consider the following:  1. Increase Lantus to 100 units QHS (home dose)--Recommend dose be given in two separate shots 50 units  each  2. Increase Novolog Meal Coverage to: Novolog 8 units TID with meals (1/3 total home dose)    --Will follow patient during hospitalization--  Wyn Quaker RN, MSN, CDE Diabetes Coordinator Inpatient Glycemic Control Team Team Pager: [phone] (8a-5p)

## 2018-05-29 NOTE — Discharge Summary (Signed)
Renee Mack at [place] NAME: Renee Mack    MR#:  [id number]  DATE OF BIRTH:  Aug 25, 1965  DATE OF ADMISSION:  05/28/2018 ADMITTING PHYSICIAN: Sela Hua, MD  DATE OF DISCHARGE: 05/29/2018  PRIMARY CARE PHYSICIAN: Olin Hauser, DO    ADMISSION DIAGNOSIS:  Leukocytosis [D72.829] Hyperglycemia [R73.9] Intractable nausea and vomiting [R11.2]  DISCHARGE DIAGNOSIS:  Active Problems:   Hyperglycemia   SECONDARY DIAGNOSIS:   Past Medical History:  Diagnosis Date  . Cancer ([place])    vulvular  . Cervical disc disease    a. 01/2018 MRI Cervical spine: Cervical spondylosis with multilevel disc and facet degeneration greatest at C5-6 and C6-7.  Moderate to sev R C5-6, mod L C5-6, and mod bilat C6-7 foraminal stenosis with multilevel mild foraminal stenosis.  Mild C5-6 and mod C6-7 canal stenosis. C6-7 central cord impingement w/ cord flattening.  . Chronic combined systolic (congestive) and diastolic (congestive) heart failure ([place])    a. 12/2017 Echo: EF 30-35%, mid-apicalanteroseptal, ant, and apical AK. Gr1 DD. Mild conc LVH; b. 03/2018 Echo: EF 45-50%, antsept, ant HK. Mild MR. Nl LA size. Nl RV fxn.  Marland Kitchen COPD (chronic obstructive pulmonary disease) (H[facility])    not on home oxygen  . Coronary artery disease    a. 12/2017 ACS/PCI: LAD 100p (2.25x26 Resolute Onyx DES), 40m (2.0x12 Resolute Onyx DES), 80d (2.0x15 Resolute Onyx DES), RCA 90p (non-dominant). EF 25-35%. Post-MI course complicated by CGS; b. 10/8097 NSTEMI/subacute thrombosis-->LAD 100 (PTCA + DES x 1); c. 01/2018 NSTEMI/PCI: LM min irregs, LAD 50-60p ISR/hazy (3.5x12 Resolute DES), 49m/d (underexpansion of prior stents-->PTCA). EF 45-50%.  . Diabetes mellitus without complication ([place])   . GERD (gastroesophageal reflux disease)   . H/O degenerative disc disease   . Hypotension   . Ischemic cardiomyopathy    a. 12/2017 Echo: EF 30-35%; b. 03/2018 Echo: EF 45-50%.  . Myocardial  infarction ([place])    a. 12/2017-->DES to LAD x 3.  . Pancreatitis   . Shingles   . Spinal stenosis   . Tobacco abuse   . Ulcer (traumatic) of oral mucosa   . Urinary incontinence     HOSPITAL COURSE:  52 year old female with chronic systolic heart failure, COPD, type 2 diabetes and tobacco dependence who presented to the ER due to nausea and vomiting and found to have hyperglycemia.  1.  Hyperglycemia with type 2 diabetes: Patient has been having issues with her blood sugars as an outpatient. Lantus was increased to 100 units and NovoLog was increased to 25 units 3 times daily by the recommendations of her PCP.  She was still taking 70 units and using sliding scale. She will follow-up with her PCP in 2 to 3 days.  She will continue to log her blood sugars.   2.  Intractable nausea and vomiting: Patient was able to eat her meals without any nausea and vomiting  3.  Chronic hypotension He is on metoprolol which we will continue, never at a lower dose.  She will need a follow-up with her PCP and monitor her blood pressure.  4.  CAD: Continue aspirin, statin, Brilinta beta-blocker and Ranexa.  5. COPD without signs of exacerbation 6.  Chronic systolic heart failure ejection fraction 45 to 50%: Patient is euvolemic    DISCHARGE CONDITIONS AND DIET:   Stable for discharge on heart healthy diabetic diet  CONSULTS OBTAINED:    DRUG ALLERGIES:   Allergies  Allergen Reactions  . Bee Venom Itching,  Shortness Of Breath and Swelling  . Metformin And Related Shortness Of Breath  . Darvon [Propoxyphene] Itching  . Gabapentin Swelling  . Nsaids Other (See Comments)    Ulcers   . Tramadol Hives  . Contrast Media [Iodinated Diagnostic Agents] Rash    If you benadryl, and steroids she is able to take the contrast per pt    DISCHARGE MEDICATIONS:   Allergies as of 05/29/2018      Reactions   Bee Venom Itching, Shortness Of Breath, Swelling   Metformin And Related Shortness Of Breath    Darvon [propoxyphene] Itching   Gabapentin Swelling   Nsaids Other (See Comments)   Ulcers   Tramadol Hives   Contrast Media [iodinated Diagnostic Agents] Rash   If you benadryl, and steroids she is able to take the contrast per pt      Medication List    TAKE these medications   acetaminophen 325 MG tablet Commonly known as:  TYLENOL Take 650 mg by mouth every 6 (six) hours as needed for mild pain.   aspirin 81 MG tablet Take 81 mg by mouth daily.   atorvastatin 80 MG tablet Commonly known as:  LIPITOR Take 1 tablet (80 mg total) by mouth daily at 6 PM.   citalopram 20 MG tablet Commonly known as:  CELEXA Take 1 tablet (20 mg total) by mouth daily.   esomeprazole 40 MG capsule Commonly known as:  NEXIUM Take 40 mg by mouth daily.   insulin aspart 100 UNIT/ML injection Commonly known as:  novoLOG Inject 25 Units into the skin 3 (three) times daily before meals. Take 10 units PLUS sliding scale What changed:  how much to take   insulin glargine 100 UNIT/ML injection Commonly known as:  LANTUS Inject 1 mL (100 Units total) into the skin at bedtime.   lidocaine 2 % jelly Commonly known as:  XYLOCAINE Apply 1 application topically as needed.   LORazepam 1 MG tablet Commonly known as:  ATIVAN Take 1 tablet (1 mg total) by mouth 2 (two) times daily as needed for anxiety.   metoprolol succinate 25 MG 24 hr tablet Commonly known as:  TOPROL-XL Take 0.5 tablets (12.5 mg total) by mouth daily. Start taking on:  05/30/2018 What changed:  how much to take   ondansetron 4 MG tablet Commonly known as:  ZOFRAN Take 1 tablet (4 mg total) by mouth daily as needed.   oxyCODONE-acetaminophen 5-325 MG tablet Commonly known as:  PERCOCET/ROXICET Take 1 tablet by mouth every 8 (eight) hours as needed for moderate pain or severe pain (degenerative disc disease; arthritis). What changed:    how much to take  when to take this   PROAIR HFA 108 (90 Base) MCG/ACT  inhaler Generic drug:  albuterol Inhale 1 puff into the lungs every 6 (six) hours as needed for wheezing or shortness of breath.   promethazine 25 MG tablet Commonly known as:  PHENERGAN Take 25 mg by mouth every 6 (six) hours as needed for nausea. for nausea   ranolazine 500 MG 12 hr tablet Commonly known as:  RANEXA Take 1 tablet (500 mg total) by mouth 2 (two) times daily.   terconazole 0.8 % vaginal cream Commonly known as:  TERAZOL 3 Apply intravaginally with applicator for 3 nights and topically to vulva then apply topically to vulva only.   ticagrelor 90 MG Tabs tablet Commonly known as:  BRILINTA Take 90 mg by mouth 2 (two) times daily.  Today   CHIEF COMPLAINT:  Patient tolerated breakfast without any issues denies nausea or vomiting   VITAL SIGNS:  Blood pressure (!) 93/57, pulse 63, temperature 97.6 F (36.4 C), temperature source Oral, resp. rate 18, height 5\' 1"  (1.549 m), weight 61.1 kg, SpO2 95 %.   REVIEW OF SYSTEMS:  Review of Systems  Constitutional: Negative.  Negative for chills, fever and malaise/fatigue.  HENT: Negative.  Negative for ear discharge, ear pain, hearing loss, nosebleeds and sore throat.   Eyes: Negative.  Negative for blurred vision and pain.  Respiratory: Negative.  Negative for cough, hemoptysis, shortness of breath and wheezing.   Cardiovascular: Negative.  Negative for chest pain, palpitations and leg swelling.  Gastrointestinal: Negative.  Negative for abdominal pain, blood in stool, diarrhea, nausea and vomiting.  Genitourinary: Negative.  Negative for dysuria.  Musculoskeletal: Negative.  Negative for back pain.  Skin: Negative.   Neurological: Negative for dizziness, tremors, speech change, focal weakness, seizures and headaches.  Endo/Heme/Allergies: Negative.  Does not bruise/bleed easily.  Psychiatric/Behavioral: Negative.  Negative for depression, hallucinations and suicidal ideas.     PHYSICAL EXAMINATION:   GENERAL:  52 y.o.-year-old patient lying in the bed with no acute distress.  NECK:  Supple, no jugular venous distention. No thyroid enlargement, no tenderness.  LUNGS: Normal breath sounds bilaterally, no wheezing, rales,rhonchi  No use of accessory muscles of respiration.  CARDIOVASCULAR: S1, S2 normal. No murmurs, rubs, or gallops.  ABDOMEN: Soft, non-tender, non-distended. Bowel sounds present. No organomegaly or mass.  EXTREMITIES: No pedal edema, cyanosis, or clubbing.  PSYCHIATRIC: The patient is alert and oriented x 3.  SKIN: No obvious rash, lesion, or ulcer.   DATA REVIEW:   CBC Recent Labs  Lab 05/29/18 0352  WBC 7.2  HGB 13.8  HCT 42.7  PLT 207    Chemistries  Recent Labs  Lab 05/29/18 0352  NA 136  K 4.2  CL 105  CO2 25  GLUCOSE 325*  BUN 15  CREATININE 0.40*  CALCIUM 8.4*    Cardiac Enzymes Recent Labs  Lab 05/22/18 1621 05/22/18 2141 05/28/18 0744  TROPONINI <0.03 <0.03 <0.03    Microbiology Results  @[email or web address] @  RADIOLOGY:  Dg Chest 1 View  Result Date: 05/28/2018 CLINICAL DATA:  Leukocytosis. Abdominal pain and nausea and vomiting. Pancreatitis. EXAM: CHEST  1 VIEW COMPARISON:  05/22/2018 FINDINGS: The heart size and mediastinal contours are within normal limits. Both lungs are clear. The visualized skeletal structures are unremarkable. IMPRESSION: No active disease. Electronically Signed   By: Earle Gell M.D.   On: 05/28/2018 13:44      Allergies as of 05/29/2018      Reactions   Bee Venom Itching, Shortness Of Breath, Swelling   Metformin And Related Shortness Of Breath   Darvon [propoxyphene] Itching   Gabapentin Swelling   Nsaids Other (See Comments)   Ulcers   Tramadol Hives   Contrast Media [iodinated Diagnostic Agents] Rash   If you benadryl, and steroids she is able to take the contrast per pt      Medication List    TAKE these medications   acetaminophen 325 MG tablet Commonly known as:  TYLENOL Take 650 mg by  mouth every 6 (six) hours as needed for mild pain.   aspirin 81 MG tablet Take 81 mg by mouth daily.   atorvastatin 80 MG tablet Commonly known as:  LIPITOR Take 1 tablet (80 mg total) by mouth daily at 6 PM.   citalopram 20 MG tablet  Commonly known as:  CELEXA Take 1 tablet (20 mg total) by mouth daily.   esomeprazole 40 MG capsule Commonly known as:  NEXIUM Take 40 mg by mouth daily.   insulin aspart 100 UNIT/ML injection Commonly known as:  novoLOG Inject 25 Units into the skin 3 (three) times daily before meals. Take 10 units PLUS sliding scale What changed:  how much to take   insulin glargine 100 UNIT/ML injection Commonly known as:  LANTUS Inject 1 mL (100 Units total) into the skin at bedtime.   lidocaine 2 % jelly Commonly known as:  XYLOCAINE Apply 1 application topically as needed.   LORazepam 1 MG tablet Commonly known as:  ATIVAN Take 1 tablet (1 mg total) by mouth 2 (two) times daily as needed for anxiety.   metoprolol succinate 25 MG 24 hr tablet Commonly known as:  TOPROL-XL Take 0.5 tablets (12.5 mg total) by mouth daily. Start taking on:  05/30/2018 What changed:  how much to take   ondansetron 4 MG tablet Commonly known as:  ZOFRAN Take 1 tablet (4 mg total) by mouth daily as needed.   oxyCODONE-acetaminophen 5-325 MG tablet Commonly known as:  PERCOCET/ROXICET Take 1 tablet by mouth every 8 (eight) hours as needed for moderate pain or severe pain (degenerative disc disease; arthritis). What changed:    how much to take  when to take this   PROAIR HFA 108 (90 Base) MCG/ACT inhaler Generic drug:  albuterol Inhale 1 puff into the lungs every 6 (six) hours as needed for wheezing or shortness of breath.   promethazine 25 MG tablet Commonly known as:  PHENERGAN Take 25 mg by mouth every 6 (six) hours as needed for nausea. for nausea   ranolazine 500 MG 12 hr tablet Commonly known as:  RANEXA Take 1 tablet (500 mg total) by mouth 2 (two)  times daily.   terconazole 0.8 % vaginal cream Commonly known as:  TERAZOL 3 Apply intravaginally with applicator for 3 nights and topically to vulva then apply topically to vulva only.   ticagrelor 90 MG Tabs tablet Commonly known as:  BRILINTA Take 90 mg by mouth 2 (two) times daily.          Management plans discussed with the patient and she is in agreement. Stable for discharge home  Patient should follow up with pcp  CODE STATUS:     Code Status Orders  (From admission, onward)         Start     Ordered   05/28/18 1342  Full code  Continuous     05/28/18 1341        Code Status History    Date Active Date Inactive Code Status Order ID Comments User Context   05/22/2018 0607 05/23/2018 1837 Full Code [id number]  Harrie Foreman, MD ED   03/24/2018 1305 03/25/2018 1821 Full Code [id number]  Hillary Bow, MD ED   02/07/2018 0630 02/09/2018 1532 Full Code [id number]  Arta Silence, MD ED   01/26/2018 0811 01/28/2018 1803 Full Code [id number]  Harrie Foreman, MD Inpatient   01/15/2018 1342 01/17/2018 1617 Full Code [id number]  Demetrios Loll, MD Inpatient   12/26/2017 1538 12/30/2017 1928 Full Code [id number]  Wellington Hampshire, MD Inpatient   12/26/2017 1537 12/26/2017 1538 Full Code [id number]  Vaughan Basta, MD Inpatient   12/19/2017 0839 12/21/2017 1940 Full Code [id number]  Wellington Hampshire, MD Inpatient   11/03/2017 1031 11/04/2017 1850 Full Code [id number]  Fritzi Mandes, MD Inpatient  09/28/2017 1044 09/29/2017 2121 Full Code [id number]  Harrie Foreman, MD ED   07/17/2017 2004 07/20/2017 1840 Full Code [id number]  Henreitta Leber, MD ED   06/12/2017 2227 06/16/2017 1910 Full Code [id number]  Gorden Harms, MD Inpatient   12/28/2014 0305 12/28/2014 1919 Full Code [id number]  Lance Coon, MD Inpatient      TOTAL TIME TAKING CARE OF THIS PATIENT: 38 minutes.    Note: This dictation was prepared with Dragon dictation along with smaller phrase technology. Any  transcriptional errors that result from this process are unintentional.  Veldon Wager M.D on 05/29/2018 at 10:16 AM  Between 7am to 6pm - Pager - [phone] After 6pm go to [email or web address] - password [email or web address] [facility]  Office  [phone]  CC: Primary care physician; Olin Hauser, DO

## 2018-05-29 NOTE — Progress Notes (Signed)
Patient not found in room.  Believe that she discharged herself and walked out of hospital on her own accord.  Looked throughout the second floor and walked down to visitors entrance but have not seen her.  Telemetry and PIV had already been removed and patient had discharge paperwork.

## 2018-05-29 NOTE — Plan of Care (Signed)
  Problem: Activity: Goal: Risk for activity intolerance will decrease Outcome: Progressing Note:  Up independently to bathroom   Problem: Nutrition: Goal: Adequate nutrition will be maintained Outcome: Progressing   Problem: Coping: Goal: Level of anxiety will decrease Outcome: Progressing   Problem: Safety: Goal: Ability to remain free from injury will improve Outcome: Progressing   Problem: Pain Managment: Goal: General experience of comfort will improve Outcome: Not Progressing Note:  Pt continues to have chronic back pain, treating with oxycodone q4hr   Problem: Education: Goal: Knowledge of General Education information will improve Description Including pain rating scale, medication(s)/side effects and non-pharmacologic comfort measures Outcome: Completed/Met   Problem: Clinical Measurements: Goal: Respiratory complications will improve Outcome: Not Applicable

## 2018-05-29 NOTE — Progress Notes (Signed)
Patient alert and oriented, vss, no complaints of pain.  D/c telemetry, d/c PIV.  Given discharge paperwork.

## 2018-05-30 LAB — PAP LB AND HPV HIGH-RISK: HPV, high-risk: NEGATIVE

## 2018-05-31 ENCOUNTER — Inpatient Hospital Stay: Payer: Medicaid Other

## 2018-05-31 ENCOUNTER — Ambulatory Visit: Payer: Medicaid Other

## 2018-05-31 ENCOUNTER — Encounter: Payer: Self-pay | Admitting: Family Medicine

## 2018-05-31 ENCOUNTER — Ambulatory Visit (INDEPENDENT_AMBULATORY_CARE_PROVIDER_SITE_OTHER): Payer: Medicaid Other | Admitting: Family Medicine

## 2018-05-31 VITALS — BP 130/60 | HR 65 | Temp 98.3°F | Resp 16 | Ht 61.0 in | Wt 139.6 lb

## 2018-05-31 DIAGNOSIS — E1140 Type 2 diabetes mellitus with diabetic neuropathy, unspecified: Secondary | ICD-10-CM

## 2018-05-31 DIAGNOSIS — M4802 Spinal stenosis, cervical region: Secondary | ICD-10-CM | POA: Diagnosis not present

## 2018-05-31 DIAGNOSIS — IMO0002 Reserved for concepts with insufficient information to code with codable children: Secondary | ICD-10-CM

## 2018-05-31 DIAGNOSIS — E1165 Type 2 diabetes mellitus with hyperglycemia: Secondary | ICD-10-CM

## 2018-05-31 DIAGNOSIS — R739 Hyperglycemia, unspecified: Secondary | ICD-10-CM | POA: Diagnosis not present

## 2018-05-31 DIAGNOSIS — F418 Other specified anxiety disorders: Secondary | ICD-10-CM | POA: Diagnosis not present

## 2018-05-31 MED ORDER — LORAZEPAM 1 MG PO TABS: 1.0000 mg | ORAL_TABLET | Freq: Two times a day (BID) | ORAL | 2 refills | Status: DC | PRN

## 2018-05-31 NOTE — Progress Notes (Signed)
Subjective:    Patient ID: Renee Mack, female    DOB: [birth year], 52 y.o.   MRN: [id number]  Renee Mack is a 52 y.o. female presenting on 05/31/2018 for Hospitalization Follow-up (nausea)   [place] VISIT  Hospital/Location: [place] Date of Admission: 05/28/18 Date of Discharge: 05/29/18 Transitions of care telephone call: Not completed  Reason for Admission / Diagnosis: nausea, vomiting, hyperglycemia  FOLLOW-UP - Hospital H&P and Discharge Summary have been reviewed  - Patient presents today about 2 days after recent hospitalization. Brief summary of recent course, patient had symptoms of hyperglycemia and nausea/vomiting for few days, hospitalized, treated with IVF, lab testing, insulin dosage increase, and anti emetic.  - Today reports overall has done well after discharge. Symptoms of nausea vomiting have improved now feels about 40-50% better, but not normal yet. She is on higher dose of insulin.  Additionally she reports that she is followed by [facility], she has some limitation on which medications she can take due to history of DKA. States A1c has improved from 15 down to 11 - last reading 11.9. She continues on insulin regimen per Endocrinology. She has a new apt with Malissa Hippo NP tomorrow 06/01/18  - New medications on discharge: None - Also taking Promethazine 25mg  q 6 hr PRN which helps her nausea vomiting and high sugars have caused some nausea - Recently hospital visit 05/22/18 due to chest pain, she was started on Ranexa. - Changes to current meds on discharge: Lantus up from 70 up to 100u and Novolog sliding scale up to 25u TID  Denies any abdominal pain, fevers, chills, active nausea vomiting  Additional complaints:  Pain Management / OA DJD multiple joints, Cervical Spine / Spinal Stenosis - Followed by Dr Meriel Pica [facility], and has been seen in [facility] 01/2018 had MRI C spine that showed spinal stenosis, she was advised to be  referred to Neurologist or Neurosurgery for 2nd opinion.  Anxiety w/ depression Since last visit 02/2018, we discussed her mental health, and she was to return to [facility], to Dr Miles Costain, she saw him recently and he has continued her on the Celexa 20mg  daily. He reportedly thinks that she still needs the Ativan every day but he is limited on rx this med, and asked if we could continue it here. - She is taking Ativan 1mg  BID every day, she has recently been out of this med few days and feels some "jittery" and "nervous feeling", no prior history of withdrawal symptoms.   Depression screen [facility] 2/9 05/31/2018 02/27/2018  Decreased Interest 1 1  Down, Depressed, Hopeless 1 1  PHQ - 2 Score 2 2  Altered sleeping 2 3  Tired, decreased energy 1 3  Change in appetite 2 1  Feeling bad or failure about yourself  2 0  Trouble concentrating 2 -  Moving slowly or fidgety/restless 1 1  Suicidal thoughts 0 1  PHQ-9 Score 12 11  Difficult doing work/chores Not difficult at all Somewhat difficult   GAD 7 : Generalized Anxiety Score 05/31/2018 02/27/2018  Nervous, Anxious, on Edge 2 2  Control/stop worrying 2 2  Worry too much - different things 2 3  Trouble relaxing 2 3  Restless 1 2  Easily annoyed or irritable 1 2  Afraid - awful might happen 1 3  Total GAD 7 Score 11 17  Anxiety Difficulty Not difficult at all Somewhat difficult     Social History   Tobacco Use  .  Smoking status: Former Smoker    Packs/day: 0.50    Years: 30.00    Pack years: 15.00    Types: Cigarettes    Last attempt to quit: 12/19/2017    Years since quitting: 0.4  . Smokeless tobacco: Never Used  . Tobacco comment: quit 12/19/2017.  Substance Use Topics  . Alcohol use: No  . Drug use: No    Review of Systems Per HPI unless specifically indicated above     Objective:    BP 130/60   Pulse 65   Temp 98.3 F (36.8 C) (Oral)   Resp 16   Ht 5\' 1"  (1.549 m)   Wt 139 lb 9.6 oz (63.3 kg)   BMI 26.38 kg/m     Wt Readings from Last 3 Encounters:  05/31/18 139 lb 9.6 oz (63.3 kg)  05/29/18 134 lb 11.2 oz (61.1 kg)  05/24/18 137 lb (62.1 kg)    Physical Exam  Constitutional: She is oriented to person, place, and time. She appears well-developed and well-nourished. No distress.  Currently well appearing, comfortable except some neck pain, cooperative  HENT:  Head: Normocephalic and atraumatic.  Mouth/Throat: Oropharynx is clear and moist.  Eyes: Conjunctivae are normal. Right eye exhibits no discharge. Left eye exhibits no discharge.  Neck: No thyromegaly present.  Reduced ROM neck  Cardiovascular: Normal rate, regular rhythm, normal heart sounds and intact distal pulses.  No murmur heard. Pulmonary/Chest: Effort normal and breath sounds normal. No respiratory distress. She has no wheezes. She has no rales.  Abdominal: Soft. Bowel sounds are normal. She exhibits no distension. There is no tenderness. There is no guarding.  Musculoskeletal: Normal range of motion. She exhibits no edema.  Lymphadenopathy:    She has no cervical adenopathy.  Neurological: She is alert and oriented to person, place, and time.  Skin: Skin is warm and dry. No rash noted. She is not diaphoretic. No erythema.  Psychiatric: She has a normal mood and affect. Her behavior is normal.  Well groomed, good eye contact, normal speech and thoughts  Nursing note and vitals reviewed.    I have personally reviewed the radiology report from 01/26/18 MRI Cervical Spine.  EXAM: MRI CERVICAL SPINE WITHOUT CONTRAST  TECHNIQUE: Multiplanar, multisequence MR imaging of the cervical spine was performed. No intravenous contrast was administered.  COMPARISON:  None.  FINDINGS: Alignment: Physiologic.  Vertebrae: No fracture, evidence of discitis, or bone lesion.  Cord: Normal signal and morphology.  Posterior Fossa, vertebral arteries, paraspinal tissues: Negative.  Disc levels:  C2-3: No significant disc  displacement, foraminal stenosis, or canal stenosis.  C3-4: Mild disc osteophyte complex with left-greater-than-right uncovertebral and facet hypertrophy. Mild left foraminal stenosis. No significant canal stenosis.  C4-5: Mild disc osteophyte complex with bilateral uncovertebral and facet hypertrophy. Mild bilateral foraminal stenosis. No significant canal stenosis.  C5-6: Moderate disc osteophyte complex with right greater than left uncovertebral and facet hypertrophy. Moderate to severe right and moderate left foraminal stenosis. Mild canal stenosis with disc contact on the right anterior cord and mild cord flattening.  C6-7: Moderate disc osteophyte complex, left central disc protrusion with annular fissure, bilateral uncovertebral hypertrophy, and bilateral facet hypertrophy. Moderate bilateral foraminal stenosis. Moderate canal stenosis. Central cord impingement with cord flattening.  C7-T1: No significant disc displacement, foraminal stenosis, or canal stenosis. Bilateral facet hypertrophy.  IMPRESSION: 1. No acute osseous or cord signal abnormality. 2. Cervical spondylosis with multilevel disc and facet degenerative changes greatest at C5-6 and C6-7 levels. 3. Moderate to severe  right C5-6, moderate left C5-6, and moderate bilateral C6-7 foraminal stenosis. Multilevel mild foraminal stenosis. 4. Mild C5-6 and moderate C6-7 canal stenosis. C6-7 central cord impingement with cord flattening.   Electronically Signed   By: Kristine Garbe M.D.   On: 01/26/2018 20:22  Results for orders placed or performed during the hospital encounter of 05/28/18  Glucose, capillary  Result Value Ref Range   Glucose-Capillary 539 (HH) 70 - 99 mg/dL  Basic metabolic panel  Result Value Ref Range   Sodium 126 (L) 135 - 145 mmol/L   Potassium 5.2 (H) 3.5 - 5.1 mmol/L   Chloride 91 (L) 98 - 111 mmol/L   CO2 26 22 - 32 mmol/L   Glucose, Bld 521 (HH) 70 - 99 mg/dL    BUN 21 (H) 6 - 20 mg/dL   Creatinine, Ser 0.57 0.44 - 1.00 mg/dL   Calcium 9.1 8.9 - 10.3 mg/dL   GFR calc non Af Amer >60 >60 mL/min   GFR calc Af Amer >60 >60 mL/min   Anion gap 9 5 - 15  CBC  Result Value Ref Range   WBC 14.3 (H) 4.0 - 10.5 K/uL   RBC 4.95 3.87 - 5.11 MIL/uL   Hemoglobin 15.4 (H) 12.0 - 15.0 g/dL   HCT 46.4 (H) 36.0 - 46.0 %   MCV 93.7 80.0 - 100.0 fL   MCH 31.1 26.0 - 34.0 pg   MCHC 33.2 30.0 - 36.0 g/dL   RDW 13.1 11.5 - 15.5 %   Platelets 210 150 - 400 K/uL   nRBC 0.0 0.0 - 0.2 %  Urinalysis, Complete w Microscopic  Result Value Ref Range   Color, Urine COLORLESS (A) YELLOW   APPearance CLEAR (A) CLEAR   Specific Gravity, Urine 1.008 1.005 - 1.030   pH 6.0 5.0 - 8.0   Glucose, UA >=500 (A) NEGATIVE mg/dL   Hgb urine dipstick NEGATIVE NEGATIVE   Bilirubin Urine NEGATIVE NEGATIVE   Ketones, ur NEGATIVE NEGATIVE mg/dL   Protein, ur NEGATIVE NEGATIVE mg/dL   Nitrite NEGATIVE NEGATIVE   Leukocytes, UA TRACE (A) NEGATIVE   RBC / HPF 0-5 0 - 5 RBC/hpf   WBC, UA 0-5 0 - 5 WBC/hpf   Bacteria, UA RARE (A) NONE SEEN   Squamous Epithelial / LPF 0-5 0 - 5   Mucus PRESENT   Lipase, blood  Result Value Ref Range   Lipase 25 11 - 51 U/L  Blood gas, venous  Result Value Ref Range   pH, Ven 7.36 7.250 - 7.430   pCO2, Ven 49 44.0 - 60.0 mmHg   Bicarbonate 27.7 20.0 - 28.0 mmol/L   Acid-Base Excess 1.5 0.0 - 2.0 mmol/L   O2 Saturation 44.7 %   Patient temperature 37.0    Collection site VEIN    Sample type VEIN   Troponin I - Add-On to previous collection  Result Value Ref Range   Troponin I <0.03 <0.03 ng/mL  Glucose, capillary  Result Value Ref Range   Glucose-Capillary 371 (H) 70 - 99 mg/dL  Glucose, capillary  Result Value Ref Range   Glucose-Capillary 266 (H) 70 - 99 mg/dL  Basic metabolic panel  Result Value Ref Range   Sodium 135 135 - 145 mmol/L   Potassium 4.6 3.5 - 5.1 mmol/L   Chloride 104 98 - 111 mmol/L   CO2 25 22 - 32 mmol/L   Glucose,  Bld 396 (H) 70 - 99 mg/dL   BUN 13 6 - 20 mg/dL  Creatinine, Ser 0.60 0.44 - 1.00 mg/dL   Calcium 8.5 (L) 8.9 - 10.3 mg/dL   GFR calc non Af Amer >60 >60 mL/min   GFR calc Af Amer >60 >60 mL/min   Anion gap 6 5 - 15  Glucose, capillary  Result Value Ref Range   Glucose-Capillary 246 (H) 70 - 99 mg/dL  Glucose, capillary  Result Value Ref Range   Glucose-Capillary 359 (H) 70 - 99 mg/dL   Comment 1 Notify RN   Basic metabolic panel  Result Value Ref Range   Sodium 136 135 - 145 mmol/L   Potassium 4.2 3.5 - 5.1 mmol/L   Chloride 105 98 - 111 mmol/L   CO2 25 22 - 32 mmol/L   Glucose, Bld 325 (H) 70 - 99 mg/dL   BUN 15 6 - 20 mg/dL   Creatinine, Ser 0.40 (L) 0.44 - 1.00 mg/dL   Calcium 8.4 (L) 8.9 - 10.3 mg/dL   GFR calc non Af Amer >60 >60 mL/min   GFR calc Af Amer >60 >60 mL/min   Anion gap 6 5 - 15  CBC  Result Value Ref Range   WBC 7.2 4.0 - 10.5 K/uL   RBC 4.46 3.87 - 5.11 MIL/uL   Hemoglobin 13.8 12.0 - 15.0 g/dL   HCT 42.7 36.0 - 46.0 %   MCV 95.7 80.0 - 100.0 fL   MCH 30.9 26.0 - 34.0 pg   MCHC 32.3 30.0 - 36.0 g/dL   RDW 13.2 11.5 - 15.5 %   Platelets 207 150 - 400 K/uL   nRBC 0.0 0.0 - 0.2 %  Glucose, capillary  Result Value Ref Range   Glucose-Capillary 263 (H) 70 - 99 mg/dL   Comment 1 Notify RN    Comment 2 Document in Chart   Glucose, capillary  Result Value Ref Range   Glucose-Capillary 313 (H) 70 - 99 mg/dL   Comment 1 Notify RN    Comment 2 Document in Chart   Glucose, capillary  Result Value Ref Range   Glucose-Capillary 320 (H) 70 - 99 mg/dL  Glucose, capillary  Result Value Ref Range   Glucose-Capillary 230 (H) 70 - 99 mg/dL      Assessment & Plan:   Problem List Items Addressed This Visit    Anxiety associated with depression Stable chronic problem Followed by [facility] - Agree to continue Ativan BDZ since she has some mild withdrawal symptoms, and has been counseled by her psychiatry that she may continue this - recommended that we  continue the rx Future will taper off BDZ    Relevant Medications   LORazepam (ATIVAN) 1 MG tablet   Hyperglycemia   Uncontrolled type 2 diabetes with neuropathy (HCC) - Primary  Improved control but still elevated A1c >11.9 now on higher dose insulin Secondary Hyperglycemia Followed by [facility] - will return this week for follow-up    Relevant Medications   LORazepam (ATIVAN) 1 MG tablet    Other Visit Diagnoses    Degenerative cervical spinal stenosis     Chronic problem with neck pain OA/DJD and spinal stenosis identified on imaging MRI  Refer to [facility] for further intervention    Relevant Orders   Ambulatory referral to Neurosurgery      Meds ordered this encounter  Medications  . LORazepam (ATIVAN) 1 MG tablet    Sig: Take 1 tablet (1 mg total) by mouth 2 (two) times daily as needed for anxiety.    Dispense:  60 tablet  Refill:  2   Orders Placed This Encounter  Procedures  . Ambulatory referral to Neurosurgery    Referral Priority:   Routine    Referral Type:   Surgical    Referral Reason:   Specialty Services Required    Referred to Provider:   Marin Olp, PA-C    Requested Specialty:   Neurosurgery    Number of Visits Requested:   1   I have reviewed the discharge medication list, and have reconciled the current and discharge medications today.     Current Outpatient Medications:  .  acetaminophen (TYLENOL) 325 MG tablet, Take 650 mg by mouth every 6 (six) hours as needed for mild pain. , Disp: , Rfl:  .  albuterol (PROAIR HFA) 108 (90 Base) MCG/ACT inhaler, Inhale 1 puff into the lungs every 6 (six) hours as needed for wheezing or shortness of breath. , Disp: , Rfl:  .  aspirin 81 MG tablet, Take 81 mg by mouth daily., Disp: , Rfl:  .  atorvastatin (LIPITOR) 80 MG tablet, Take 1 tablet (80 mg total) by mouth daily at 6 PM., Disp: 90 tablet, Rfl: 3 .  citalopram (CELEXA) 20 MG tablet, Take 1 tablet (20 mg total) by mouth daily., Disp:  90 tablet, Rfl: 1 .  esomeprazole (NEXIUM) 40 MG capsule, Take 40 mg by mouth daily. , Disp: , Rfl:  .  insulin aspart (NOVOLOG) 100 UNIT/ML injection, Inject 25 Units into the skin 3 (three) times daily before meals. Take 10 units PLUS sliding scale, Disp: 22.5 mL, Rfl: 1 .  insulin glargine (LANTUS) 100 UNIT/ML injection, Inject 1 mL (100 Units total) into the skin at bedtime., Disp: 30 mL, Rfl: 1 .  lidocaine (XYLOCAINE) 2 % jelly, Apply 1 application topically as needed., Disp: 85 mL, Rfl: 0 .  LORazepam (ATIVAN) 1 MG tablet, Take 1 tablet (1 mg total) by mouth 2 (two) times daily as needed for anxiety., Disp: 60 tablet, Rfl: 2 .  metoprolol succinate (TOPROL-XL) 25 MG 24 hr tablet, Take 0.5 tablets (12.5 mg total) by mouth daily., Disp: 30 tablet, Rfl: 0 .  ondansetron (ZOFRAN) 4 MG tablet, Take 1 tablet (4 mg total) by mouth daily as needed. (Patient not taking: Reported on 05/28/2018), Disp: 10 tablet, Rfl: 0 .  oxyCODONE-acetaminophen (PERCOCET/ROXICET) 5-325 MG tablet, Take 1 tablet by mouth every 8 (eight) hours as needed for moderate pain or severe pain (degenerative disc disease; arthritis). (Patient taking differently: Take 2 tablets by mouth every 4 (four) hours as needed for moderate pain or severe pain (degenerative disc disease; arthritis). ), Disp: 15 tablet, Rfl: 0 .  promethazine (PHENERGAN) 25 MG tablet, Take 25 mg by mouth every 6 (six) hours as needed for nausea. for nausea, Disp: , Rfl: 3 .  ranolazine (RANEXA) 500 MG 12 hr tablet, Take 1 tablet (500 mg total) by mouth 2 (two) times daily., Disp: 60 tablet, Rfl: 3 .  terconazole (TERAZOL 3) 0.8 % vaginal cream, Apply intravaginally with applicator for 3 nights and topically to vulva then apply topically to vulva only., Disp: 20 g, Rfl: 2 .  ticagrelor (BRILINTA) 90 MG TABS tablet, Take 90 mg by mouth 2 (two) times daily., Disp: , Rfl:     Follow up plan: Return in about 3 months (around 08/30/2018) for 3 month,  Anxiety/Depression PHQ/GAD med refill, DM updates.  Nobie Putnam, [facility] 05/31/2018, 2:27 PM

## 2018-05-31 NOTE — Progress Notes (Deleted)
Gynecologic Oncology Interval Visit   Referring Provider: Dr. Parks Ranger  Chief Complaint: H/o vulvar cancer with new painful vulvar lesions  Subjective:  Renee Mack is a 52 y.o. female s/p TH for "uterine cancer" and h/o vulvar cancer who is seen in consultation from Dr. Parks Ranger for possible recurrent vulvar cancer who presents today for vulvar biopsies.   Due to recent cardiac event she can not stop her anti-platelet therapy. She and her cardiologist are aware of risk of increased bleeding.   At her last visit she was prescribed topical antifungals for vulvar candidiasis and topical lidocaine for pain control.   05/24/2018 Pap HGSIL/HRHPV negative  Gynecologic Oncology History: Renee Mack is a pleasant female s/p TH for "uterine cancer" and h/o vulvar cancer who is seen in consultation from Dr. Parks Ranger for possible recurrent vulvar cancer.   ~ 1994  History of 'uterine cancer' s/p hysterectomy in [place] for patient reported 'dysplasia; they'd tried burning and cutting it out'- vaginal hysterectomy; Retained ovaries and 1 tube.  05/05/2004- stage I vulvar cancer 1.3 mm invasion treated with left radical vulvectomy and bilateral groin node dissection 2013-   pap benign HPV negative 04/2012- right vulvectomy for microinvasive vulvar cancer and been 3 by Dr. Maudie Mercury 10/04/2012  MOHS surgery right groin for [facility] 10/15/2014- vulvar colposcopy revealed no suspicious lesions; patient was well-appearing and transferred to ED where she was diagnosed with DKA and pancreatitis 04/07/2017- presented to ED with vulvar pain exam notable for cobblestone epithelium at left labia minora (biopsied), cobblestoned, irritated appearing mucosa to left of urethra and vagina.  Approximately 2 mm isolated vesicle on right labia.    Macerated erythematous skin at buttocks abutting anus appearing chronically moist.  White epithelium at 6:00 of the anus but perianal exam limited by lack of stirrups.  Vulvar  biopsy showed HSIL at least VIN 2. 04/12/17-  seen for ER follow-up by Dr. [name]. Per note, at least recurrent disease amenable to resection.  Left upper aspect of vulva not amenable to outpatient laser.  May also have right-sided disease but patient declined vulvar biopsy.    Chronic vulvar pain not reproducible. 11/13/2017-11/16/2017-admitted to hospital for vulvar cellulitis and underwent I&D, treated with broad-spectrum antibiotics and discharged on Bactrim. 11/24/2017-  ER visit for severe vulvovaginal candidiasis treated with oral fluconazole and topical miconazole with oxycodone for breakthrough pain.   Patient lost to follow-up.   She has history of type 2 diabetes mellitus with hemoglobin A1c of 11.9% on 03/24/18 down from 15.1% on 11/14/2017. Current smoker. CAD s/p NSTEMI 12/2017 stenting x 5, chronic combined systolic and diastolic heart failure, ischemic cardiomyopathy, GERD, pancreatitis, multiple episodes of DKA with recurrent UTI, depression, panic disorder, chronic pain syndrome. LVEF 45-50%.  She says that recent heart attack "scared her" and that she realized that she needs to start taking better care of herself.  She has been advised that she needs additional surgery for treatment of vulvar cancer but cannot have surgery until her blood sugars are under better control. She continues indefinite anticoagulation with aspirin and brilinta (antiplatelet). She presented with complaints of vulvar pain , worse with urination, urinary incontinence, night sweats, headaches.   She was discharged from hospital , 05/23/18 after admission for hyperglycemia and unstable angina with underlying CAD. She underwent cardiac catheterization and percutaneous angioplasty of prior stent.  Per her report she had a Pap 6 months ago but in the computer her last Pap was 03/2012 ([facility]).    Problem List: Patient Active Problem List  Diagnosis Date Noted  . Hyperglycemia 05/28/2018  . Vulvar intraepithelial  neoplasia (VIN) 05/24/2018  . History of ST elevation myocardial infarction (STEMI) 02/27/2018  . CAD (coronary artery disease) 02/27/2018  . Hyperlipidemia associated with type 2 diabetes mellitus ([place]) 02/27/2018  . Anxiety associated with depression 02/27/2018  . Chronic systolic heart failure ([place])   . Chest pain 12/26/2017  . Unstable angina ([place])   . Ischemic cardiomyopathy   . Former smoker   . Protein-calorie malnutrition, severe 07/18/2017  . Uncontrolled type 2 diabetes with neuropathy ([place]) 12/28/2014  . COPD (chronic obstructive pulmonary disease) ([place]) 12/28/2014  . GERD (gastroesophageal reflux disease) 12/28/2014  . History of shingles 12/28/2014  . Hyperlipidemia LDL goal <70 12/28/2014    Past Medical History: Past Medical History:  Diagnosis Date  . Cancer ([place])    vulvular  . Cervical disc disease    a. 01/2018 MRI Cervical spine: Cervical spondylosis with multilevel disc and facet degeneration greatest at C5-6 and C6-7.  Moderate to sev R C5-6, mod L C5-6, and mod bilat C6-7 foraminal stenosis with multilevel mild foraminal stenosis.  Mild C5-6 and mod C6-7 canal stenosis. C6-7 central cord impingement w/ cord flattening.  . Chronic combined systolic (congestive) and diastolic (congestive) heart failure ([place])    a. 12/2017 Echo: EF 30-35%, mid-apicalanteroseptal, ant, and apical AK. Gr1 DD. Mild conc LVH; b. 03/2018 Echo: EF 45-50%, antsept, ant HK. Mild MR. Nl LA size. Nl RV fxn.  Marland Kitchen COPD (chronic obstructive pulmonary disease) ([facility])    not on home oxygen  . Coronary artery disease    a. 12/2017 ACS/PCI: LAD 100p (2.25x26 Resolute Onyx DES), 41m (2.0x12 Resolute Onyx DES), 80d (2.0x15 Resolute Onyx DES), RCA 90p (non-dominant). EF 25-35%. Post-MI course complicated by CGS; b. 06/8297 NSTEMI/subacute thrombosis-->LAD 100 (PTCA + DES x 1); c. 01/2018 NSTEMI/PCI: LM min irregs, LAD 50-60p ISR/hazy (3.5x12 Resolute DES), 34m/d (underexpansion of prior stents-->PTCA). EF  45-50%.  . Diabetes mellitus without complication ([place])   . GERD (gastroesophageal reflux disease)   . H/O degenerative disc disease   . Hypotension   . Ischemic cardiomyopathy    a. 12/2017 Echo: EF 30-35%; b. 03/2018 Echo: EF 45-50%.  . Myocardial infarction ([place])    a. 12/2017-->DES to LAD x 3.  . Pancreatitis   . Shingles   . Spinal stenosis   . Tobacco abuse   . Ulcer (traumatic) of oral mucosa   . Urinary incontinence     Past Surgical History: Past Surgical History:  Procedure Laterality Date  . ABDOMINAL HYSTERECTOMY     partial  . CORONARY BALLOON ANGIOPLASTY N/A 05/22/2018   Procedure: CORONARY BALLOON ANGIOPLASTY;  Surgeon: Wellington [place], MD;  Location: [place] [facility];  Service: Cardiovascular;  Laterality: N/A;  . CORONARY STENT INTERVENTION N/A 12/26/2017   Procedure: CORONARY STENT INTERVENTION;  Surgeon: Wellington [place], MD;  Location: [place] [facility];  Service: Cardiovascular;  Laterality: N/A;  . CORONARY STENT INTERVENTION N/A 02/08/2018   Procedure: CORONARY STENT INTERVENTION;  Surgeon: Nelva Bush, MD;  Location: [place] [facility];  Service: Cardiovascular;  Laterality: N/A;  . CORONARY/GRAFT ACUTE MI REVASCULARIZATION N/A 12/19/2017   Procedure: Coronary/Graft Acute MI Revascularization;  Surgeon: Wellington [place], MD;  Location: [place] [facility];  Service: Cardiovascular;  Laterality: N/A;  . ECTOPIC PREGNANCY SURGERY Left   . INTRAVASCULAR ULTRASOUND/IVUS N/A 02/08/2018   Procedure: Intravascular Ultrasound/IVUS;  Surgeon: Nelva Bush, MD;  Location: [place] [facility];  Service: Cardiovascular;  Laterality: N/A;  . LEFT HEART CATH AND CORONARY ANGIOGRAPHY N/A 12/19/2017   Procedure: LEFT HEART CATH AND CORONARY ANGIOGRAPHY;  Surgeon: Wellington [place], MD;  Location: [place] [facility];  Service: Cardiovascular;  Laterality: N/A;  . LEFT HEART CATH AND CORONARY ANGIOGRAPHY N/A 12/26/2017   Procedure: LEFT HEART CATH AND CORONARY  ANGIOGRAPHY;  Surgeon: Wellington [place], MD;  Location: [place] [facility];  Service: Cardiovascular;  Laterality: N/A;  . LEFT HEART CATH AND CORONARY ANGIOGRAPHY N/A 02/08/2018   Procedure: LEFT HEART CATH AND CORONARY ANGIOGRAPHY;  Surgeon: Nelva Bush, MD;  Location: [place];  Service: Cardiovascular;  Laterality: N/A;  . LEFT HEART CATH AND CORONARY ANGIOGRAPHY N/A 05/22/2018   Procedure: LEFT HEART CATH AND CORONARY ANGIOGRAPHY;  Surgeon: Wellington [place], MD;  Location: [place];  Service: Cardiovascular;  Laterality: N/A;  . LYMPHADENECTOMY    . SPLENECTOMY, PARTIAL    . vulvulasectomy      Past Gynecologic History:  Menarche: age 61 Menstrual details- irregular menstrual periods Hysterectomy ~ 25 years ago History of abnormal pap: yes History of STDs: denies Sexually active: denies  OB History:  [id number] Vaginal delivery  OB History  No data available    Family History: Family History  Problem Relation Age of Onset  . Non-Hodgkin's lymphoma Mother   . Osteoporosis Mother   . Lupus Sister   . Heart disease Brother   . Heart attack Brother   . Heart attack Father     Social History: Social History   Socioeconomic History  . Marital status: Widowed    Spouse name: Not on file  . Number of children: 4  . Years of education: Not on file  . Highest education level: Not on file  Occupational History  . Occupation: disabled  Social Needs  . Financial resource strain: Not very hard  . Food insecurity:    Worry: Never true    Inability: Never true  . Transportation needs:    Medical: No    Non-medical: No  Tobacco Use  . Smoking status: Former Smoker    Packs/day: 0.50    Years: 30.00    Pack years: 15.00    Types: Cigarettes    Last attempt to quit: 12/19/2017    Years since quitting: 0.4  . Smokeless tobacco: Never Used  . Tobacco comment: quit 12/19/2017.  Substance and Sexual Activity  . Alcohol use: No  . Drug use: No   . Sexual activity: Not Currently  Lifestyle  . Physical activity:    Days per week: 7 days    Minutes per session: 30 min  . Stress: Very much  Relationships  . Social connections:    Talks on phone: More than three times a week    Gets together: Patient refused    Attends religious service: Never    Active member of club or organization: No    Attends meetings of clubs or organizations: Never    Relationship status: Widowed  . Intimate partner violence:    Fear of current or ex partner: No    Emotionally abused: No    Physically abused: No    Forced sexual activity: No  Other Topics Concern  . Not on file  Social History Narrative   Lives in [place] by herself.  Does not routinely exercise.    Allergies: Allergies  Allergen Reactions  . Bee Venom Itching, Shortness Of Breath and Swelling  . Metformin And Related Shortness  Of Breath  . Darvon [Propoxyphene] Itching  . Gabapentin Swelling  . Nsaids Other (See Comments)    Ulcers   . Tramadol Hives  . Contrast Media [Iodinated Diagnostic Agents] Rash    If you benadryl, and steroids she is able to take the contrast per pt    Current Medications: Current Outpatient Medications  Medication Sig Dispense Refill  . acetaminophen (TYLENOL) 325 MG tablet Take 650 mg by mouth every 6 (six) hours as needed for mild pain.     Marland Kitchen albuterol (PROAIR HFA) 108 (90 Base) MCG/ACT inhaler Inhale 1 puff into the lungs every 6 (six) hours as needed for wheezing or shortness of breath.     Marland Kitchen aspirin 81 MG tablet Take 81 mg by mouth daily.    Marland Kitchen atorvastatin (LIPITOR) 80 MG tablet Take 1 tablet (80 mg total) by mouth daily at 6 PM. 90 tablet 3  . citalopram (CELEXA) 20 MG tablet Take 1 tablet (20 mg total) by mouth daily. 90 tablet 1  . esomeprazole (NEXIUM) 40 MG capsule Take 40 mg by mouth daily.     . insulin aspart (NOVOLOG) 100 UNIT/ML injection Inject 25 Units into the skin 3 (three) times daily before meals. Take 10 units PLUS  sliding scale 22.5 mL 1  . insulin glargine (LANTUS) 100 UNIT/ML injection Inject 1 mL (100 Units total) into the skin at bedtime. 30 mL 1  . lidocaine (XYLOCAINE) 2 % jelly Apply 1 application topically as needed. 85 mL 0  . LORazepam (ATIVAN) 1 MG tablet Take 1 tablet (1 mg total) by mouth 2 (two) times daily as needed for anxiety. 60 tablet 0  . metoprolol succinate (TOPROL-XL) 25 MG 24 hr tablet Take 0.5 tablets (12.5 mg total) by mouth daily. 30 tablet 0  . ondansetron (ZOFRAN) 4 MG tablet Take 1 tablet (4 mg total) by mouth daily as needed. (Patient not taking: Reported on 05/28/2018) 10 tablet 0  . oxyCODONE-acetaminophen (PERCOCET/ROXICET) 5-325 MG tablet Take 1 tablet by mouth every 8 (eight) hours as needed for moderate pain or severe pain (degenerative disc disease; arthritis). (Patient taking differently: Take 2 tablets by mouth every 4 (four) hours as needed for moderate pain or severe pain (degenerative disc disease; arthritis). ) 15 tablet 0  . promethazine (PHENERGAN) 25 MG tablet Take 25 mg by mouth every 6 (six) hours as needed for nausea. for nausea  3  . ranolazine (RANEXA) 500 MG 12 hr tablet Take 1 tablet (500 mg total) by mouth 2 (two) times daily. 60 tablet 3  . terconazole (TERAZOL 3) 0.8 % vaginal cream Apply intravaginally with applicator for 3 nights and topically to vulva then apply topically to vulva only. 20 g 2  . ticagrelor (BRILINTA) 90 MG TABS tablet Take 90 mg by mouth 2 (two) times daily.     No current facility-administered medications for this visit.     Review of Systems General:  Night sweats Skin: no complaints Eyes: vision impairment- awaiting eye doctor appt HEENT: no complaints Breasts: no complaints Pulmonary: no complaints Cardiac: no complaints Gastrointestinal: nausea and vomiting (not currently) Genitourinary/Sexual: urinary incontinence; pain with urination Ob/Gyn: vulvar pain Musculoskeletal: no complaints Hematology: on aspirin and  Brillinta Neurologic/Psych: headaches   Objective:  Physical Examination:  There were no vitals taken for this visit.    ECOG Performance Status: 1 - Symptomatic but completely ambulatory  Limited exam  Pelvic: EGBUS: very abnormal appearing vulvar with intense erythema involving the bilateral labia and clitoral hood.  Defect around the urethra and finding concerning for possible recurrent disease. There is AWE involving the right labia minora consistent with VIN at least.  Diffuse erythema involving the posterior vulva consistent with candidiasis.  Cervix: absent Vagina: no lesions, no discharge or bleeding. Pap obtained Uterus: absent Adnexa: limited exam due to discomfort. Fullness at left vaginal fornix.  Rectovaginal: confirmatory  VAGINAL COLPOSCOPY AND BIOPSY; VULVAR BIOPSY  The risks and benefits of the procedure were reviewed and informed consent obtained. Time out was performed. The patient received pre-procedure teaching and expressed understanding. The post-procedure instructions were reviewed with the patient and she expressed understanding. The patient does not have any barriers to learning. Pre-procedure Pain score:   Vaginal colposcopy revealed ***  A *** lesion was visible on the ***. The area was numbed with hurricane jelly and injected with lidocaine. A biopsy forceps was used to obtained a biopsy.  Hemostasis was obtained with silver nitrate. She tolerated the procedure well and is in stable condition.  Pre-procedure Pain score:    Lab Review Labs on site today: No labs on site today  Radiologic Imaging: No imaging on site today    Assessment:  Mccartney Brucks is a 51 y.o. female diagnosed with history of stage I vulvar cancer 1.3 mm invasion treated with left radical vulvectomy and bilateral groin node dissection in 05/05/2004; followed by right vulvectomy for microinvasive vulvar cancer 04/2012; MO[facility] surgery right groin for [facility] 10/04/2012; subsequent VIN2 at  least disease on biopsy 03/2017; then lost to follow up. Exam concerning for VIN versus vulvar cancer recurrence. Biopsies obtained.  Currently on anticoagulation with ticagrelor CAD s/p NSTEMI 12/2017 stenting x 5; s/p cardiac catherization 05/22/2018 and percutaneous angioplasty of prior stent.   05/24/2018 Pap HGSIL/HRHPV negative; colposcopy vagina ***  Symptomatic vulvar candidiasis.   Medical co-morbidities complicating care:History of ST elevation myocardial infarction (STEMI); CAD; type 2 DM; Chronic systolic heart failure ([place]); Ischemic cardiomyopathy; Protein-calorie malnutrition, severe; COPD.    Plan:   Problem List Items Addressed This Visit      Genitourinary   Vulvar intraepithelial neoplasia (VIN) - Primary    Other Visit Diagnoses    Vulvar lesion       Vulvar cancer ([place])          Follow up vulvar biopsies ***.   Topical lidocaine provided for pain control.   ***Topical antifungals for vulvar candidiasis.   Suggested return to clinic in  1-2 weeks to discuss biopsy results.    The patient's diagnosis, an outline of the further diagnostic and laboratory studies which will be required, the recommendation for surgery, and alternatives were discussed with her and her accompanying family members.  All questions were answered to their satisfaction.  A total of *** minutes were spent with the patient/family today; >50% was spent in education, counseling and coordination of care for possible recurrent vulvar cancer.   Ang[place] Gaetana Michaelis, MD    CC:  Olin Hauser, DO [address] [place], [place] [address] [phone]

## 2018-05-31 NOTE — Patient Instructions (Addendum)
Thank you for coming to the office today.  For blood sugar, keep on higher dose insulin for now - follow-up with Malissa Hippo, NP at Endocrinology  Refilled Ativan today - take one pill twice a day as needed - caution with withdrawal symptoms if you do not take for few days, in future we can slowly taper off of this. Keep discussing with Dr Solon Palm Neurosurgery at [facility]) / Surgery performed at [place], [place] [address] Ph - [phone]  Please schedule a Follow-up Appointment to: Return in about 3 months (around 08/30/2018) for 3 month, Anxiety/Depression PHQ/GAD med refill, DM updates.  If you have any other questions or concerns, please feel free to call the office or send a message through [place]. You may also schedule an earlier appointment if necessary.  Additionally, you may be receiving a survey about your experience at our office within a few days to 1 week by e-mail or mail. We value your feedback.  Nobie Putnam, DO [place]

## 2018-06-01 ENCOUNTER — Telehealth: Payer: Self-pay

## 2018-06-01 NOTE — Telephone Encounter (Signed)
Ms. Gatliff did not come to her appointment that was arranged for vulvar biopsy on 05/31/18. Attempted to call and her voicemail is full. She did present to her PCP 12/11 for hospital follow up. Letter will be sent with pap smear results and recommendation for biopsy. Oncology Nurse Navigator Documentation  Navigator Location: [facility] (06/01/18 0900)   )Navigator Encounter Type: Telephone (06/01/18 0900) Telephone: Outgoing Call;Diagnostic Results (06/01/18 0900)                                                  Time Spent with Patient: 15 (06/01/18 0900)

## 2018-06-05 ENCOUNTER — Telehealth: Payer: Self-pay | Admitting: Nurse Practitioner

## 2018-06-05 NOTE — Telephone Encounter (Signed)
Again attempted to contact patient regarding results and need for biopsy. No answer. Message left to return call.

## 2018-06-06 ENCOUNTER — Telehealth: Payer: Self-pay | Admitting: Nurse Practitioner

## 2018-06-06 NOTE — Telephone Encounter (Signed)
Patient returned call this morning.  Results of Pap smear discussed with patient as well as need for vulvar biopsy.  She is able to come to clinic tomorrow on 06/07/2018.  Appointment made.  Advised that this appointment would be with Dr. Fransisca Connors.  Per cardiology, she will not hold her anticoagulation.  She will have a driver.

## 2018-06-06 NOTE — Progress Notes (Deleted)
Cardiology Office Note Date:  06/06/2018  Patient ID:  Renee, Mack 28-Aug-1965, MRN [id number] PCP:  Olin Hauser, DO  Cardiologist:  Dr. Fletcher Anon, MD  ***refresh   Chief Complaint: Hospital follow up  History of Present Illness: Renee Mack is a 52 y.o. female with history of CAD s/pMI and drug-eluting stent placement to LAD x3 in 12/2017,with subsequent readmission for subacute stent thrombosis and repeat PCI x3, combined systolic and diastolic congestive heart failure, ischemic cardiomyopathy with EF of 45 to 50%, former tobacco abuse, COPD, hyperlipidemia, GERD, poorly controlled type 2 diabetes mellitus, and pancreatitis who presents for hospital follow-up after recent admission from 12/2 through 12/3 for unstable angina.  Renee Mack has had a complicated few months following initial STEMI the first of July for which she underwent cardiac cath that showedLAD 100p, 16m, 80d, RCA 90p (non-dominant).She required three DES to the LAD. Her recovery was complicated by cardiogenic shock and hypotension requiring vasopressors. Following initial MI, EF was reduced to 30-35% and was provided a LifeVest at discharge. This was followed by two separate admissions for chest, neck, arm, and back pain with associated nausea and vomiting. She underwent a cardiac cath, once in July and again in August. Each requiring PCI and stent placement related to ISR of the LAD.Upon cath in 82/019, under-expansion of prior LAD stents was noted.For recurrent pain, it was recommended to consider PCI of the nondominant RCA with possible hemodynamic assessment of the coronary-pulmonary artery fisulae to exclude coronary steal phenomenon. Repeat echo showed continued improvement, with latest echo showing an EF of 45-50% in October. Her LifeVest was able to be discontinued at that time. She has had issues with neck, arm and back pain related to cervical disc disease that was found on MRI in August and was  admitted again in October for the same pain. Her cardiac work up was negative at that time, her pain was reproducible with movementand attributed to cervical disc disease. She was last seen in the office on 05/03/2018, and was doing very well at that time. She has been noted to have relative hypotension which has precluded the addition of long-acting nitrate therapy.   She was admitted to the hospital on 12/2 with sudden onset of severe chest pain rated 8 out of 10 that began around 2 to 2:30 in the morning with associated shortness of breath, diaphoresis, nausea, and one episode of emesis.  EKG showed nonspecific ST-T changes.  Cardiac enzymes remain negative.  Chest x-ray showed no acute disease.  She underwent repeat cardiac cath on 05/22/2018 that showed significant underlying three-vessel CAD with patent LAD stents except in the mid to distal segment where there was 95% stenosis that appeared to be a combination of restenosis and possible localized thrombus.  She was noted to have stable proximal RCA disease in a nondominant vessel.  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  She underwent successful PTCA to the mid LAD with noncompliant balloon high pressure.  She was noted to have normal LVEDP.  She was readmitted to the hospital from 12/8-12/9 with intractable nausea and vomiting and was treated for hyperglycemia in the setting of poorly controlled diabetes with blood glucose greater than 500.  Cardiac enzyme was negative.  ***  Past Medical History:  Diagnosis Date  . Cancer ([place])    vulvular  . Cervical disc disease    a. 01/2018 MRI Cervical spine: Cervical spondylosis with multilevel  disc and facet degeneration greatest at C5-6 and C6-7.  Moderate to sev R C5-6, mod L C5-6, and mod bilat C6-7 foraminal stenosis with multilevel mild foraminal stenosis.  Mild C5-6 and mod C6-7 canal stenosis. C6-7 central cord impingement w/ cord  flattening.  . Chronic combined systolic (congestive) and diastolic (congestive) heart failure ([place])    a. 12/2017 Echo: EF 30-35%, mid-apicalanteroseptal, ant, and apical AK. Gr1 DD. Mild conc LVH; b. 03/2018 Echo: EF 45-50%, antsept, ant HK. Mild MR. Nl LA size. Nl RV fxn.  Marland Kitchen COPD (chronic obstructive pulmonary disease) ([facility])    not on home oxygen  . Coronary artery disease    a. 12/2017 ACS/PCI: LAD 100p (2.25x26 Resolute Onyx DES), 6m (2.0x12 Resolute Onyx DES), 80d (2.0x15 Resolute Onyx DES), RCA 90p (non-dominant). EF 25-35%. Post-MI course complicated by CGS; b. 09/8544 NSTEMI/subacute thrombosis-->LAD 100 (PTCA + DES x 1); c. 01/2018 NSTEMI/PCI: LM min irregs, LAD 50-60p ISR/hazy (3.5x12 Resolute DES), 50m/d (underexpansion of prior stents-->PTCA). EF 45-50%.  . Diabetes mellitus without complication ([place])   . GERD (gastroesophageal reflux disease)   . H/O degenerative disc disease   . Hypotension   . Ischemic cardiomyopathy    a. 12/2017 Echo: EF 30-35%; b. 03/2018 Echo: EF 45-50%.  . Myocardial infarction ([place])    a. 12/2017-->DES to LAD x 3.  . Pancreatitis   . Shingles   . Spinal stenosis   . Tobacco abuse   . Ulcer (traumatic) of oral mucosa   . Urinary incontinence     Past Surgical History:  Procedure Laterality Date  . ABDOMINAL HYSTERECTOMY     partial  . CORONARY BALLOON ANGIOPLASTY N/A 05/22/2018   Procedure: CORONARY BALLOON ANGIOPLASTY;  Surgeon: Wellington Hampshire, MD;  Location: [facility];  Service: Cardiovascular;  Laterality: N/A;  . CORONARY STENT INTERVENTION N/A 12/26/2017   Procedure: CORONARY STENT INTERVENTION;  Surgeon: Wellington Hampshire, MD;  Location: [facility];  Service: Cardiovascular;  Laterality: N/A;  . CORONARY STENT INTERVENTION N/A 02/08/2018   Procedure: CORONARY STENT INTERVENTION;  Surgeon: Nelva Bush, MD;  Location: [facility];  Service: Cardiovascular;  Laterality: N/A;  . CORONARY/GRAFT ACUTE MI  REVASCULARIZATION N/A 12/19/2017   Procedure: Coronary/Graft Acute MI Revascularization;  Surgeon: Wellington Hampshire, MD;  Location: [facility];  Service: Cardiovascular;  Laterality: N/A;  . ECTOPIC PREGNANCY SURGERY Left   . INTRAVASCULAR ULTRASOUND/IVUS N/A 02/08/2018   Procedure: Intravascular Ultrasound/IVUS;  Surgeon: Nelva Bush, MD;  Location: [facility];  Service: Cardiovascular;  Laterality: N/A;  . LEFT HEART CATH AND CORONARY ANGIOGRAPHY N/A 12/19/2017   Procedure: LEFT HEART CATH AND CORONARY ANGIOGRAPHY;  Surgeon: Wellington Hampshire, MD;  Location: [facility];  Service: Cardiovascular;  Laterality: N/A;  . LEFT HEART CATH AND CORONARY ANGIOGRAPHY N/A 12/26/2017   Procedure: LEFT HEART CATH AND CORONARY ANGIOGRAPHY;  Surgeon: Wellington Hampshire, MD;  Location: [facility];  Service: Cardiovascular;  Laterality: N/A;  . LEFT HEART CATH AND CORONARY ANGIOGRAPHY N/A 02/08/2018   Procedure: LEFT HEART CATH AND CORONARY ANGIOGRAPHY;  Surgeon: Nelva Bush, MD;  Location: [facility];  Service: Cardiovascular;  Laterality: N/A;  . LEFT HEART CATH AND CORONARY ANGIOGRAPHY N/A 05/22/2018   Procedure: LEFT HEART CATH AND CORONARY ANGIOGRAPHY;  Surgeon: Wellington Hampshire, MD;  Location: [facility];  Service: Cardiovascular;  Laterality: N/A;  . LYMPHADENECTOMY    . SPLENECTOMY, PARTIAL    .  vulvulasectomy      No outpatient medications have been marked as taking for the 06/08/18 encounter (Appointment) with Rise Mu, PA-C.    Allergies:   Bee venom; Metformin and related; Darvon [propoxyphene]; Gabapentin; Nsaids; Tramadol; and Contrast media [iodinated diagnostic agents]   Social History:  The patient  reports that she quit smoking about 5 months ago. Her smoking use included cigarettes. She has a 15.00 pack-year smoking history. She has never used smokeless tobacco. She reports that she does not drink alcohol or use drugs.    Family History:  The patient's family history includes Heart attack in her brother and father; Heart disease in her brother; Lupus in her sister; Non-Hodgkin's lymphoma in her mother; Osteoporosis in her mother.  ROS:   ROS   PHYSICAL EXAM: *** VS:  There were no vitals taken for this visit. BMI: There is no height or weight on file to calculate BMI.  Physical Exam   EKG:  Was ordered and interpreted by me today. Shows ***  Recent Labs: 03/24/2018: B Natriuretic Peptide 91.0; Magnesium 1.7 05/22/2018: ALT 16; TSH 1.447 05/29/2018: BUN 15; Creatinine, Ser 0.40; Hemoglobin 13.8; Platelets 207; Potassium 4.2; Sodium 136  03/06/2018: Cholesterol 129; HDL 26; LDL Cholesterol 60; Total CHOL/HDL Ratio 5.0; Triglycerides 213; VLDL 43   Estimated Creatinine Clearance: 68.5 mL/min (A) (by C-G formula based on SCr of 0.4 mg/dL (L)).   Wt Readings from Last 3 Encounters:  05/31/18 139 lb 9.6 oz (63.3 kg)  05/29/18 134 lb 11.2 oz (61.1 kg)  05/24/18 137 lb (62.1 kg)     Other studies reviewed: Additional studies/records reviewed today include: summarized above  ASSESSMENT AND PLAN:  1. ***  Disposition: F/u with Dr. Fletcher Anon or an APP in ***  Current medicines are reviewed at length with the patient today.  The patient did not have any concerns regarding medicines.  Signed, Christell Faith, PA-C 06/06/2018 7:47 AM     [facility] [address] [place] [address] [place], [place] [address] [phone]

## 2018-06-07 ENCOUNTER — Inpatient Hospital Stay: Payer: Medicaid Other

## 2018-06-07 ENCOUNTER — Ambulatory Visit: Payer: Medicaid Other

## 2018-06-07 ENCOUNTER — Inpatient Hospital Stay (HOSPITAL_BASED_OUTPATIENT_CLINIC_OR_DEPARTMENT_OTHER): Payer: Medicaid Other | Admitting: Obstetrics and Gynecology

## 2018-06-07 ENCOUNTER — Other Ambulatory Visit: Payer: Self-pay

## 2018-06-07 VITALS — BP 105/70 | HR 62 | Temp 97.6°F | Ht 61.0 in | Wt 135.2 lb

## 2018-06-07 DIAGNOSIS — C519 Malignant neoplasm of vulva, unspecified: Secondary | ICD-10-CM | POA: Diagnosis present

## 2018-06-07 DIAGNOSIS — B373 Candidiasis of vulva and vagina: Secondary | ICD-10-CM | POA: Diagnosis not present

## 2018-06-07 DIAGNOSIS — Z90710 Acquired absence of both cervix and uterus: Secondary | ICD-10-CM | POA: Diagnosis not present

## 2018-06-07 DIAGNOSIS — N903 Dysplasia of vulva, unspecified: Secondary | ICD-10-CM

## 2018-06-07 DIAGNOSIS — R102 Pelvic and perineal pain: Secondary | ICD-10-CM

## 2018-06-07 DIAGNOSIS — F17210 Nicotine dependence, cigarettes, uncomplicated: Secondary | ICD-10-CM | POA: Diagnosis not present

## 2018-06-07 MED ORDER — OXYCODONE HCL 5 MG PO TABS: 5.0000 mg | ORAL_TABLET | Freq: Four times a day (QID) | ORAL | 0 refills | Status: DC | PRN

## 2018-06-07 MED ORDER — OXYCODONE HCL 5 MG PO TABS
5.0000 mg | ORAL_TABLET | Freq: Once | ORAL | Status: AC
  Administered 2018-06-07: 5 mg via ORAL
  Filled 2018-06-07: qty 1

## 2018-06-07 NOTE — Progress Notes (Signed)
Gynecologic Oncology interval Visit   Referring Provider: Dr. Parks Ranger  Chief Complaint: Vulvar Cancer  Subjective:  Renee Mack is a 52 y.o. female s/p TH for "uterine cancer" and h/o vulvar cancer, initially seen in consultation for Dr. Parks Ranger for possible recurrent vulvar cancer, who returns to clinic today for vulvar biopsy.  Vaginal Pap on 05/24/18 - HSIL-epithelial cell abnormality/HSILVB.  Vulvar lesion on exam concerning for VIN versus vulvar cancer recurrence.  She returns today for biopsy.  She is currently on anticoagulation per tecagrelor and aspirin post NSTEMI and cardiac cath on 05/22/18.  Cardiology was consulted for recommendation and they advised not to hold antiplatelet medications due to her very high cardiac risk.  She was hospitalized on 05/28/2018-05/29/2018 for hyperglycemia and intractable nausea and vomiting.  She was discharged home.  Patient states that she had brief improvement in her pain after treatment of vulvar candidiasis but she continues to have significant severe pain and spotting.     Gynecologic Oncology History: ~ 1994  History of 'uterine cancer' s/p hysterectomy in [place] for patient reported 'dysplasia; they'd tried burning and cutting it out'- vaginal hysterectomy; Retained ovaries and 1 tube.  05/05/2004- stage I vulvar cancer 1.3 mm invasion treated with left radical vulvectomy and bilateral groin node dissection 2013-   pap benign HPV negative 04/2012- right vulvectomy for microinvasive vulvar cancer and been 3 by Dr. Maudie Mercury 10/04/2012  MOHS surgery right groin for [facility] 10/15/2014- vulvar colposcopy revealed no suspicious lesions; patient was well-appearing and transferred to ED where she was diagnosed with DKA and pancreatitis 04/07/2017- presented to ED with vulvar pain exam notable for cobblestone epithelium at left labia minora (biopsied), cobblestoned, irritated appearing mucosa to left of urethra and vagina.  Approximately 2 mm isolated  vesicle on right labia.    Macerated erythematous skin at buttocks abutting anus appearing chronically moist.  White epithelium at 6:00 of the anus but perianal exam limited by lack of stirrups.  Vulvar biopsy showed HSIL at least VIN 2. 04/12/17-  seen for ER follow-up by Dr. [name]. Per note, at least recurrent disease amenable to resection.  Left upper aspect of vulva not amenable to outpatient laser.  May also have right-sided disease but patient declined vulvar biopsy.    Chronic vulvar pain not reproducible. 11/13/2017-11/16/2017-admitted to hospital for vulvar cellulitis and underwent I&D, treated with broad-spectrum antibiotics and discharged on Bactrim. 11/24/2017-  ER visit for severe vulvovaginal candidiasis treated with oral fluconazole and topical miconazole with oxycodone for breakthrough pain.   Patient lost to follow-up.   She has history of type 2 diabetes mellitus with hemoglobin A1c of 11.9% on 03/24/18 down from 15.1% on 11/14/2017. Current smoker. CAD s/p NSTEMI 12/2017 stenting x 5, chronic combined systolic and diastolic heart failure, ischemic cardiomyopathy, GERD, pancreatitis, multiple episodes of DKA with recurrent UTI, depression, panic disorder, chronic pain syndrome. LVEF 45-50%.  She says that recent heart attack "scared her" and that she realized that she needs to start taking better care of herself.  She has been advised that she needs additional surgery for treatment of vulvar cancer but cannot have surgery until her blood sugars are under better control. She continues indefinite anticoagulation with aspirin and brilinta (antiplatelet). She has vulvar pain , worse with urination, urinary incontinence. She also complains of night sweats, headaches.  She was discharged from hospital yesterday, 05/23/18 after admission for hyperglycemia and unstable angina with underlying CAD. She underwent cardiac catheterization and percutaneous angioplasty of prior stent.  Per her report  she had  a Pap 6 months ago but in the computer her last Pap was 03/2012 ([facility]). She complains of vulvar pain.    Problem List: Patient Active Problem List   Diagnosis Date Noted  . Hyperglycemia 05/28/2018  . Vulvar intraepithelial neoplasia (VIN) 05/24/2018  . History of ST elevation myocardial infarction (STEMI) 02/27/2018  . CAD (coronary artery disease) 02/27/2018  . Hyperlipidemia associated with type 2 diabetes mellitus ([place]) 02/27/2018  . Anxiety associated with depression 02/27/2018  . Chronic systolic heart failure ([place])   . Chest pain 12/26/2017  . Unstable angina ([place])   . Ischemic cardiomyopathy   . Former smoker   . Protein-calorie malnutrition, severe 07/18/2017  . Uncontrolled type 2 diabetes with neuropathy ([place]) 12/28/2014  . COPD (chronic obstructive pulmonary disease) ([place]) 12/28/2014  . GERD (gastroesophageal reflux disease) 12/28/2014  . History of shingles 12/28/2014  . Hyperlipidemia LDL goal <70 12/28/2014    Past Medical History: Past Medical History:  Diagnosis Date  . Cancer ([place])    vulvular  . Cervical disc disease    a. 01/2018 MRI Cervical spine: Cervical spondylosis with multilevel disc and facet degeneration greatest at C5-6 and C6-7.  Moderate to sev R C5-6, mod L C5-6, and mod bilat C6-7 foraminal stenosis with multilevel mild foraminal stenosis.  Mild C5-6 and mod C6-7 canal stenosis. C6-7 central cord impingement w/ cord flattening.  . Chronic combined systolic (congestive) and diastolic (congestive) heart failure ([place])    a. 12/2017 Echo: EF 30-35%, mid-apicalanteroseptal, ant, and apical AK. Gr1 DD. Mild conc LVH; b. 03/2018 Echo: EF 45-50%, antsept, ant HK. Mild MR. Nl LA size. Nl RV fxn.  Marland Kitchen COPD (chronic obstructive pulmonary disease) ([facility])    not on home oxygen  . Coronary artery disease    a. 12/2017 ACS/PCI: LAD 100p (2.25x26 Resolute Onyx DES), 67m (2.0x12 Resolute Onyx DES), 80d (2.0x15 Resolute Onyx DES), RCA 90p (non-dominant). EF 25-35%.  Post-MI course complicated by CGS; b. 06/7614 NSTEMI/subacute thrombosis-->LAD 100 (PTCA + DES x 1); c. 01/2018 NSTEMI/PCI: LM min irregs, LAD 50-60p ISR/hazy (3.5x12 Resolute DES), 69m/d (underexpansion of prior stents-->PTCA). EF 45-50%.  . Diabetes mellitus without complication ([place])   . GERD (gastroesophageal reflux disease)   . H/O degenerative disc disease   . Hypotension   . Ischemic cardiomyopathy    a. 12/2017 Echo: EF 30-35%; b. 03/2018 Echo: EF 45-50%.  . Myocardial infarction ([place])    a. 12/2017-->DES to LAD x 3.  . Pancreatitis   . Shingles   . Spinal stenosis   . Tobacco abuse   . Ulcer (traumatic) of oral mucosa   . Urinary incontinence     Past Surgical History: Past Surgical History:  Procedure Laterality Date  . ABDOMINAL HYSTERECTOMY     partial  . CORONARY BALLOON ANGIOPLASTY N/A 05/22/2018   Procedure: CORONARY BALLOON ANGIOPLASTY;  Surgeon: Wellington [place], MD;  Location: [place] [facility];  Service: Cardiovascular;  Laterality: N/A;  . CORONARY STENT INTERVENTION N/A 12/26/2017   Procedure: CORONARY STENT INTERVENTION;  Surgeon: Wellington [place], MD;  Location: [place] [facility];  Service: Cardiovascular;  Laterality: N/A;  . CORONARY STENT INTERVENTION N/A 02/08/2018   Procedure: CORONARY STENT INTERVENTION;  Surgeon: Nelva Bush, MD;  Location: [place] [facility];  Service: Cardiovascular;  Laterality: N/A;  . CORONARY/GRAFT ACUTE MI REVASCULARIZATION N/A 12/19/2017   Procedure: Coronary/Graft Acute MI Revascularization;  Surgeon: Wellington [place], MD;  Location: [place] [facility];  Service: Cardiovascular;  Laterality:  N/A;  . ECTOPIC PREGNANCY SURGERY Left   . INTRAVASCULAR ULTRASOUND/IVUS N/A 02/08/2018   Procedure: Intravascular Ultrasound/IVUS;  Surgeon: Nelva Bush, MD;  Location: [place] [facility];  Service: Cardiovascular;  Laterality: N/A;  . LEFT HEART CATH AND CORONARY ANGIOGRAPHY N/A 12/19/2017   Procedure: LEFT HEART CATH AND  CORONARY ANGIOGRAPHY;  Surgeon: Wellington [place], MD;  Location: [place] [facility];  Service: Cardiovascular;  Laterality: N/A;  . LEFT HEART CATH AND CORONARY ANGIOGRAPHY N/A 12/26/2017   Procedure: LEFT HEART CATH AND CORONARY ANGIOGRAPHY;  Surgeon: Wellington [place], MD;  Location: [place] [facility];  Service: Cardiovascular;  Laterality: N/A;  . LEFT HEART CATH AND CORONARY ANGIOGRAPHY N/A 02/08/2018   Procedure: LEFT HEART CATH AND CORONARY ANGIOGRAPHY;  Surgeon: Nelva Bush, MD;  Location: [place] [facility];  Service: Cardiovascular;  Laterality: N/A;  . LEFT HEART CATH AND CORONARY ANGIOGRAPHY N/A 05/22/2018   Procedure: LEFT HEART CATH AND CORONARY ANGIOGRAPHY;  Surgeon: Wellington [place], MD;  Location: [place] [facility];  Service: Cardiovascular;  Laterality: N/A;  . LYMPHADENECTOMY    . SPLENECTOMY, PARTIAL    . vulvulasectomy      Past Gynecologic History:  Menarche: age 40 Menstrual details- irregular menstrual periods Hysterectomy ~ 25 years ago History of abnormal pap: yes History of STDs: denies Sexually active: denies  OB History:  [id number] Vaginal delivery  OB History  No obstetric history on file.    Family History: Family History  Problem Relation Age of Onset  . Non-Hodgkin's lymphoma Mother   . Osteoporosis Mother   . Lupus Sister   . Heart disease Brother   . Heart attack Brother   . Heart attack Father     Social History: Social History   Socioeconomic History  . Marital status: Widowed    Spouse name: Not on file  . Number of children: 4  . Years of education: Not on file  . Highest education level: Not on file  Occupational History  . Occupation: disabled  Social Needs  . Financial resource strain: Not very hard  . Food insecurity:    Worry: Never true    Inability: Never true  . Transportation needs:    Medical: No    Non-medical: No  Tobacco Use  . Smoking status: Former Smoker    Packs/day: 0.50    Years: 30.00     Pack years: 15.00    Types: Cigarettes    Last attempt to quit: 12/19/2017    Years since quitting: 0.4  . Smokeless tobacco: Never Used  . Tobacco comment: quit 12/19/2017.  Substance and Sexual Activity  . Alcohol use: No  . Drug use: No  . Sexual activity: Not Currently  Lifestyle  . Physical activity:    Days per week: 7 days    Minutes per session: 30 min  . Stress: Very much  Relationships  . Social connections:    Talks on phone: More than three times a week    Gets together: Patient refused    Attends religious service: Never    Active member of club or organization: No    Attends meetings of clubs or organizations: Never    Relationship status: Widowed  . Intimate partner violence:    Fear of current or ex partner: No    Emotionally abused: No    Physically abused: No    Forced sexual activity: No  Other Topics Concern  . Not on file  Social History Narrative  Lives in [place] by herself.  Does not routinely exercise.    Allergies: Allergies  Allergen Reactions  . Bee Venom Itching, Shortness Of Breath and Swelling  . Metformin And Related Shortness Of Breath  . Darvon [Propoxyphene] Itching  . Gabapentin Swelling  . Nsaids Other (See Comments)    Ulcers   . Tramadol Hives  . Contrast Media [Iodinated Diagnostic Agents] Rash    If you benadryl, and steroids she is able to take the contrast per pt    Current Medications: Current Outpatient Medications  Medication Sig Dispense Refill  . acetaminophen (TYLENOL) 325 MG tablet Take 650 mg by mouth every 6 (six) hours as needed for mild pain.     Marland Kitchen albuterol (PROAIR HFA) 108 (90 Base) MCG/ACT inhaler Inhale 1 puff into the lungs every 6 (six) hours as needed for wheezing or shortness of breath.     Marland Kitchen aspirin 81 MG tablet Take 81 mg by mouth daily.    Marland Kitchen atorvastatin (LIPITOR) 80 MG tablet Take 1 tablet (80 mg total) by mouth daily at 6 PM. 90 tablet 3  . citalopram (CELEXA) 20 MG tablet Take 1 tablet (20 mg  total) by mouth daily. 90 tablet 1  . esomeprazole (NEXIUM) 40 MG capsule Take 40 mg by mouth daily.     . insulin aspart (NOVOLOG) 100 UNIT/ML injection Inject 25 Units into the skin 3 (three) times daily before meals. Take 10 units PLUS sliding scale 22.5 mL 1  . insulin glargine (LANTUS) 100 UNIT/ML injection Inject 1 mL (100 Units total) into the skin at bedtime. 30 mL 1  . lidocaine (XYLOCAINE) 2 % jelly Apply 1 application topically as needed. 85 mL 0  . LORazepam (ATIVAN) 1 MG tablet Take 1 tablet (1 mg total) by mouth 2 (two) times daily as needed for anxiety. 60 tablet 2  . metoprolol succinate (TOPROL-XL) 25 MG 24 hr tablet Take 0.5 tablets (12.5 mg total) by mouth daily. 30 tablet 0  . ondansetron (ZOFRAN) 4 MG tablet Take 1 tablet (4 mg total) by mouth daily as needed. 10 tablet 0  . promethazine (PHENERGAN) 25 MG tablet Take 25 mg by mouth every 6 (six) hours as needed for nausea. for nausea  3  . ranolazine (RANEXA) 500 MG 12 hr tablet Take 1 tablet (500 mg total) by mouth 2 (two) times daily. 60 tablet 3  . terconazole (TERAZOL 3) 0.8 % vaginal cream Apply intravaginally with applicator for 3 nights and topically to vulva then apply topically to vulva only. 20 g 2  . ticagrelor (BRILINTA) 90 MG TABS tablet Take 90 mg by mouth 2 (two) times daily.     No current facility-administered medications for this visit.     Review of Systems General:  Night sweats Skin: no complaints Eyes: vision impairment- awaiting eye doctor appt- unchanged HEENT: no complaints Breasts: no complaints Pulmonary: no complaints Cardiac: no complaints Gastrointestinal: nausea and vomiting (not currently) Genitourinary/Sexual: urinary incontinence; pain with urination Ob/Gyn: vulvar pain & spotting Musculoskeletal: no complaints Hematology: on aspirin and Brillinta Neurologic/Psych: headaches   Objective:  Physical Examination:  BP 105/70 (BP Location: Left Arm, Patient Position: Sitting)    Pulse 62   Temp 97.6 F (36.4 C) (Tympanic)   Ht 5\' 1"  (1.549 m)   Wt 135 lb 3.2 oz (61.3 kg)   BMI 25.55 kg/m     ECOG Performance Status: 1 - Symptomatic but completely ambulatory  GENERAL: Patient is a well appearing female in no  acute distress LUNGS:  Clear to auscultation bilaterally.  No wheezes or rhonchi. HEART:  Regular rate and rhythm. No murmur appreciated. NEURO:  Nonfocal. Well oriented.  Appropriate affect.  Pelvic: EGBUS: very abnormal appearing vulvar with erythema with sharply defined velvety changes involving the bilateral labia and clitoral hood. Area around the urethra on the right is a bit irregular and finding concerning for possible recurrent disease. Diffuse erythema involving the posterior vulva consistent with candidiasis.   VULVAR BIOPSY NOTE The indications for vulvar biopsy (rule out neoplasia, establish lichen sclerosus diagnosis) were reviewed. Risks of the biopsy including pain, bleeding (increased risk), infection, inadequate specimen, scarring and need for additional procedures were discussed. The patient stated understanding and agreed to undergo procedure today. Consent was signed, time out performed. The patient received pre-procedure teaching and expressed understanding. The post-procedure instructions were reviewed with the patient and she expressed understanding. The patient does not have any barriers to learning.  The area to the right of the urethra was numbed with 3% xylocaine 5 ml. The patient's vulva was prepped with Betadine.    A biopsy forceps was used to obtained a biopsy/Two 3 -mm punch biopsy was done. Small bleeding was noted and hemostasis was achieved using silver nitrate sticks and Monsel's.  She had more bleeding when she got up, probably due to antiplatelet drugs.  Retreated with Monsel's and had her lie still for 30 minutes and no more bleeding.   The patient tolerated the procedure with some discomfort. Post-procedure instructions  (pelvic rest for one week) were given to the patient. The patient is to call with heavy bleeding, fever greater than 100.4, foul smelling vaginal discharge or other concerns.  Will contact with biopsy results.    Lab Review Labs on site today: No labs on site today  Radiologic Imaging: No imaging on site today    Assessment:  Renee Mack is a 52 y.o. female diagnosed with history of stage I vulvar cancer 1.3 mm invasion treated with left radical vulvectomy and bilateral groin node dissection in 05/05/2004; followed by right vulvectomy for microinvasive vulvar cancer 04/2012; MOHS surgery right groin for [facility] 10/04/2012; subsequent VIN2 at least disease on biopsy 03/2017; then lost to follow up. Exam concerning for VIN versus vulvar cancer recurrence. Vulvar Biopsy performed in clinic today.   Currently on anticoagulation with ticagrelor CAD s/p NSTEMI 12/2017 stenting x 5; s/p cardiac catherization 05/22/2018 and percutaneous angioplasty of prior stent.  Anticoagulation was discussed with cardiology prior to biopsy and they advised that patient was too high risk to hold medication.  Bleeding today controlled with Monsel's and silver nitrate.  Symptomatic vulvar candidiasis-improved  Medical co-morbidities complicating care:History of ST elevation myocardial infarction (STEMI); CAD; type 2 DM; Chronic systolic heart failure ([place]); Ischemic cardiomyopathy; Protein-calorie malnutrition, severe; COPD.    Plan:   Problem List Items Addressed This Visit      Genitourinary   Vulvar intraepithelial neoplasia (VIN) - Primary   Relevant Orders   Surgical pathology     Vulvar biopsy performed today. Topical lidocaine again provided for pain control along with oral oxycodone 5mg  x 10. Dose provided in clinic today. We will have patient return to clinic based on pathology results and treatment planning.   Topical antifungals for vulvar candidiasis.   Follow up HSIL vaginal Pap results. Suggested  return to clinic after biopsy results are available for treatment planning.    Beckey Rutter, DNP, AGNP-C [place] at [facility] [phone] (work cell) [phone] (office)  I personally interviewed and examined the patient.  Vulvar biopsy performed by myself.  Agreed with the above/below plan of care. Patient/family questions were answered.  Mellody Drown, MD   CC:  Olin Hauser, DO [address] [place], [place] [address] [phone]

## 2018-06-07 NOTE — Patient Instructions (Addendum)
Renee Mack, [place] sent a prescription for oxycodone to your pharmacy. You can take next dose at 4:30 if you are having severe or breakthrough pain. Continue tylenol as you have been doing for pain control. Please do not insert anything into vulvar or vagina for one week. Please call if heavy bleeding, fever greater than 100.4, foul smelling vaginal discharge, or other concerns. [phone] (option 3- triage). I will call you with biopsy results once I have them which will determine your follow-up.   Vulva Biopsy, Care After This sheet gives you information about how to care for yourself after your procedure. Your health care provider may also give you more specific instructions. If you have problems or questions, contact your health care provider. What can I expect after the procedure? After the procedure, it is common to have:  Slight bleeding from the biopsy site.  Discomfort at the biopsy site. Follow these instructions at home: Biopsy site care   Follow instructions from your health care provider about how to take care of your biopsy site. Make sure you: ? Clean the area using water and mild soap twice a day or as told by your health care provider. Gently pat the area dry. ? If you were prescribed an antibiotic ointment, apply it as told by your health care provider. Do not stop using the antibiotic even if your condition improves. ? Take a warm water bath (sitz bath) as needed to help with pain and discomfort. A sitz bath is taken while you are sitting down. The water should only come up to your hips and should cover your buttocks. ? Leave stitches (sutures), skin glue, or adhesive strips in place. These skin closures may need to stay in place for 2 weeks or longer. If adhesive strip edges start to loosen and curl up, you may trim the loose edges. Do not remove adhesive strips completely unless your health care provider tells you to do that.  Check your biopsy site every day for signs of  infection. Check for: ? More redness, swelling, or pain. ? More fluid or blood. ? Warmth. ? Pus or a bad smell.  Do not rub the biopsy area after urinating. Gently pat the area dry or use a bottle filled with warm water (peri-bottle) to clean the area. Gently wipe from front to back. Lifestyle  Wear loose, cotton underwear. Do not wear tight pants.  Do not use a tampon, douche, or put anything inside your vagina for at least 1 week or until your health care provider approves.  Do not have sex for at least 1 week or until your health care provider approves.  Do not exercise, such as running or biking, until your health care provider approves.  Do not swim or use a hot tub until your health care provider approves. You may shower or take a sitz bath. General instructions  Take over-the-counter and prescription medicines only as told by your health care provider.  Use a sanitary napkin until the bleeding stops.  Keep all follow-up visits as told by your health care provider. This is important. Contact a health care provider if:  You have more redness, swelling, or pain around your biopsy site.  You have more fluid or blood coming from your biopsy site.  Your biopsy site feels warm to the touch.  Your pain is not controlled with medicine. Get help right away if you have:  Heavy bleeding from the vulva.  Pus or a bad smell coming from your biopsy  site.  A fever.  Lower abdominal pain. Summary  After the procedure, it is common to have slight bleeding and discomfort at the biopsy site.  Follow instructions from your health care provider after your biopsy. Make sure you clean the area with water and mild soap. Pat the area dry.  Take sitz baths as needed to help with pain and discomfort. Leave any sutures in place.  Check your biopsy site for signs of infection, which may include more redness, swelling, pain, fluid, or blood, or feeling warm to the touch.  Get help right  away if you have heavy bleeding, a fever, pus or a bad smell, or pain in the lower abdomen. This information is not intended to replace advice given to you by your health care provider. Make sure you discuss any questions you have with your health care provider. Document Released: 05/24/2012 Document Revised: 12/08/2017 Document Reviewed: 12/08/2017 Elsevier Interactive Patient Education  2019 [employer].

## 2018-06-07 NOTE — Progress Notes (Signed)
Patient here for biopsy. She was hospitalized last week for hyperglycemia and N/V. She reports feeling very fatigued and weak all the time. She has vaginal discharge, itching and pain in her vulva. She is taking tylenol for pain but states it is infeffective.

## 2018-06-08 ENCOUNTER — Ambulatory Visit: Payer: Medicaid Other | Admitting: Physician Assistant

## 2018-06-08 ENCOUNTER — Telehealth: Payer: Self-pay | Admitting: Nurse Practitioner

## 2018-06-08 ENCOUNTER — Ambulatory Visit (INDEPENDENT_AMBULATORY_CARE_PROVIDER_SITE_OTHER): Payer: Medicaid Other | Admitting: Physician Assistant

## 2018-06-08 ENCOUNTER — Encounter: Payer: Self-pay | Admitting: Physician Assistant

## 2018-06-08 VITALS — BP 110/60 | HR 64 | Ht 61.0 in | Wt 136.0 lb

## 2018-06-08 DIAGNOSIS — I2510 Atherosclerotic heart disease of native coronary artery without angina pectoris: Secondary | ICD-10-CM

## 2018-06-08 DIAGNOSIS — I5022 Chronic systolic (congestive) heart failure: Secondary | ICD-10-CM

## 2018-06-08 DIAGNOSIS — I255 Ischemic cardiomyopathy: Secondary | ICD-10-CM

## 2018-06-08 DIAGNOSIS — I959 Hypotension, unspecified: Secondary | ICD-10-CM

## 2018-06-08 DIAGNOSIS — E785 Hyperlipidemia, unspecified: Secondary | ICD-10-CM | POA: Diagnosis not present

## 2018-06-08 DIAGNOSIS — E1165 Type 2 diabetes mellitus with hyperglycemia: Secondary | ICD-10-CM

## 2018-06-08 MED ORDER — METOPROLOL SUCCINATE ER 25 MG PO TB24: 25.0000 mg | ORAL_TABLET | Freq: Every day | ORAL | 3 refills | Status: DC

## 2018-06-08 NOTE — Patient Instructions (Addendum)
Medication Instructions:  Your physician has recommended you make the following change in your medication:  1- INCREASE Metoprolol to 1 tablet (25 mg) twice daily  If you need a refill on your cardiac medications before your next appointment, please call your pharmacy.   Lab work: None today   If you have labs (blood work) drawn today and your tests are completely normal, you will receive your results only by: Marland Kitchen MyChart Message (if you have MyChart) OR . A paper copy in the mail If you have any lab test that is abnormal or we need to change your treatment, we will call you to review the results.  Testing/Procedures: None ordered   Follow-Up: Please keep your scheduled appointment for Feb 14, 20 @ 1:40PM with Dr. Fletcher Anon.

## 2018-06-08 NOTE — Telephone Encounter (Signed)
Called patient to follow up after her vulvar biopsy yesterday. No answer. Not able to leave message d/t full voicemail.

## 2018-06-08 NOTE — Progress Notes (Signed)
Cardiology Office Note Date:  06/08/2018  Patient ID:  Renee Mack Dec 25, 1965, MRN [id number] PCP:  Olin Hauser, DO  Cardiologist:  Dr. Fletcher Anon, MD    Chief Complaint: Hospital follow up  History of Present Illness: Renee Mack is a 52 y.o. female with history of CAD s/pMI and drug-eluting stent placement to LAD x3 in 12/2017,with subsequent readmission for subacute stent thrombosis and repeat PCI x3, combined systolic and diastolic congestive heart failure, ischemic cardiomyopathy with EF of 45 to 50%, former tobacco abuse, COPD, hyperlipidemia, GERD, poorly controlled type 2 diabetes mellitus, and pancreatitis who presents for hospital follow-up after recent admission from 12/2 through 12/3 for unstable angina.  Renee Mack has had a complicated few months following initial STEMI the first of July for which Renee Mack underwent cardiac cath that showedLAD 100p, 65m, 80d, RCA 90p (non-dominant).Renee Mack required three DES to the LAD. Her recovery was complicated by cardiogenic shock and hypotension requiring vasopressors. Following initial MI, EF was reduced to 30-35% and was provided a LifeVest at discharge. This was followed by two separate admissions for chest, neck, arm, and back pain with associated nausea and vomiting. Renee Mack underwent a cardiac cath, once in July and again in August. Each requiring PCI and stent placement related to ISR of the LAD.Upon cath in 82/019, under-expansion of prior LAD stents was noted.For recurrent pain, it was recommended to consider PCI of the nondominant RCA with possible hemodynamic assessment of the coronary-pulmonary artery fisulae to exclude coronary steal phenomenon. Repeat echo showed continued improvement, with latest echo showing an EF of 45-50% in October. Her LifeVest was able to be discontinued at that time. Renee Mack has had issues with neck, arm and back pain related to cervical disc disease that was found on MRI in August and was admitted again in  October for the same pain. Her cardiac work up was negative at that time, her pain was reproducible with movementand attributed to cervical disc disease. Renee Mack was last seen in the office on 05/03/2018, and was doing very well at that time. Renee Mack has been noted to have relative hypotension which has precluded the addition of long-acting nitrate therapy.   Renee Mack was admitted to the hospital on 12/2 with sudden onset of severe chest pain rated 8 out of 10 that began around 2 to 2:30 in the morning with associated shortness of breath, diaphoresis, nausea, and one episode of emesis.  EKG showed nonspecific ST-T changes.  Cardiac enzymes remain negative.  Chest x-ray showed no acute disease.  Renee Mack underwent repeat cardiac cath on 05/22/2018 that showed significant underlying three-vessel CAD with patent LAD stents except in the mid to distal segment where there was 95% stenosis that appeared to be a combination of restenosis and possible localized thrombus.  Renee Mack was noted to have stable proximal RCA disease in a nondominant vessel.  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  Renee Mack underwent successful PTCA to the mid LAD with noncompliant balloon high pressure.  Renee Mack was noted to have normal LVEDP.  Renee Mack was readmitted to the hospital from 12/8-12/9 with intractable nausea and vomiting and was treated for hyperglycemia in the setting of poorly controlled diabetes with blood glucose greater than 500.  Cardiac enzyme was negative.  Renee Mack comes in doing well from a cardiac perspective.  No further chest pain or shortness of breath.  Renee Mack is compliant with all medications including dual antiplatelet therapy without missing any doses.  Renee Mack has not had any falls since Renee Mack was last seen.  No BRBPR or melena.  Renee Mack dictates that Renee Mack is taking a whole metoprolol rather than half and tolerating this well.  Renee Mack continues to not smoke.  Renee Mack is working with a diabetes counselor to  help with her blood sugars.  Renee Mack recently underwent vulvar biopsy and has noted some bleeding from this though this has stabilized.  No lower extremity swelling, abdominal distention, PND, orthopnea, early satiety.  No dizziness, presyncope, or syncope.  Energy continues to improve.  Renee Mack indicates that Renee Mack is close to feeling as good as Renee Mack felt back in 04/2018 prior to admission.  Renee Mack does not have any issues or concerns.   Past Medical History:  Diagnosis Date  . Cancer ([place])    vulvular  . Cervical disc disease    a. 01/2018 MRI Cervical spine: Cervical spondylosis with multilevel disc and facet degeneration greatest at C5-6 and C6-7.  Moderate to sev R C5-6, mod L C5-6, and mod bilat C6-7 foraminal stenosis with multilevel mild foraminal stenosis.  Mild C5-6 and mod C6-7 canal stenosis. C6-7 central cord impingement w/ cord flattening.  . Chronic combined systolic (congestive) and diastolic (congestive) heart failure ([place])    a. 12/2017 Echo: EF 30-35%, mid-apicalanteroseptal, ant, and apical AK. Gr1 DD. Mild conc LVH; b. 03/2018 Echo: EF 45-50%, antsept, ant HK. Mild MR. Nl LA size. Nl RV fxn.  Marland Kitchen COPD (chronic obstructive pulmonary disease) ([facility])    not on home oxygen  . Coronary artery disease    a. 12/2017 ACS/PCI: LAD 100p (2.25x26 Resolute Onyx DES), 36m (2.0x12 Resolute Onyx DES), 80d (2.0x15 Resolute Onyx DES), RCA 90p (non-dominant). EF 25-35%. Post-MI course complicated by CGS; b. 12/6281 NSTEMI/subacute thrombosis-->LAD 100 (PTCA + DES x 1); c. 01/2018 NSTEMI/PCI: LM min irregs, LAD 50-60p ISR/hazy (3.5x12 Resolute DES), 37m/d (underexpansion of prior stents-->PTCA). EF 45-50%.  . Diabetes mellitus without complication ([place])   . GERD (gastroesophageal reflux disease)   . H/O degenerative disc disease   . Hypotension   . Ischemic cardiomyopathy    a. 12/2017 Echo: EF 30-35%; b. 03/2018 Echo: EF 45-50%.  . Myocardial infarction ([place])    a. 12/2017-->DES to LAD x 3.  . Pancreatitis   .  Shingles   . Spinal stenosis   . Tobacco abuse   . Ulcer (traumatic) of oral mucosa   . Urinary incontinence     Past Surgical History:  Procedure Laterality Date  . ABDOMINAL HYSTERECTOMY     partial  . CORONARY BALLOON ANGIOPLASTY N/A 05/22/2018   Procedure: CORONARY BALLOON ANGIOPLASTY;  Surgeon: Wellington Hampshire, MD;  Location: [facility];  Service: Cardiovascular;  Laterality: N/A;  . CORONARY STENT INTERVENTION N/A 12/26/2017   Procedure: CORONARY STENT INTERVENTION;  Surgeon: Wellington Hampshire, MD;  Location: [facility];  Service: Cardiovascular;  Laterality: N/A;  . CORONARY STENT INTERVENTION N/A 02/08/2018   Procedure: CORONARY STENT INTERVENTION;  Surgeon: Nelva Bush, MD;  Location: [facility];  Service: Cardiovascular;  Laterality: N/A;  . CORONARY/GRAFT ACUTE MI REVASCULARIZATION N/A 12/19/2017   Procedure: Coronary/Graft Acute MI Revascularization;  Surgeon: Wellington Hampshire, MD;  Location: [facility];  Service: Cardiovascular;  Laterality: N/A;  . ECTOPIC PREGNANCY SURGERY Left   . INTRAVASCULAR ULTRASOUND/IVUS N/A 02/08/2018   Procedure: Intravascular Ultrasound/IVUS;  Surgeon: Nelva Bush, MD;  Location: [facility];  Service: Cardiovascular;  Laterality: N/A;  . LEFT HEART CATH AND  CORONARY ANGIOGRAPHY N/A 12/19/2017   Procedure: LEFT HEART CATH AND CORONARY ANGIOGRAPHY;  Surgeon: Wellington Hampshire, MD;  Location: [facility];  Service: Cardiovascular;  Laterality: N/A;  . LEFT HEART CATH AND CORONARY ANGIOGRAPHY N/A 12/26/2017   Procedure: LEFT HEART CATH AND CORONARY ANGIOGRAPHY;  Surgeon: Wellington Hampshire, MD;  Location: [facility];  Service: Cardiovascular;  Laterality: N/A;  . LEFT HEART CATH AND CORONARY ANGIOGRAPHY N/A 02/08/2018   Procedure: LEFT HEART CATH AND CORONARY ANGIOGRAPHY;  Surgeon: Nelva Bush, MD;  Location: [facility];  Service: Cardiovascular;  Laterality: N/A;  .  LEFT HEART CATH AND CORONARY ANGIOGRAPHY N/A 05/22/2018   Procedure: LEFT HEART CATH AND CORONARY ANGIOGRAPHY;  Surgeon: Wellington Hampshire, MD;  Location: [facility];  Service: Cardiovascular;  Laterality: N/A;  . LYMPHADENECTOMY    . SPLENECTOMY, PARTIAL    . vulvulasectomy      Current Meds  Medication Sig  . acetaminophen (TYLENOL) 325 MG tablet Take 650 mg by mouth every 6 (six) hours as needed for mild pain.   Marland Kitchen albuterol (PROAIR HFA) 108 (90 Base) MCG/ACT inhaler Inhale 1 puff into the lungs every 6 (six) hours as needed for wheezing or shortness of breath.   Marland Kitchen aspirin 81 MG tablet Take 81 mg by mouth daily.  Marland Kitchen atorvastatin (LIPITOR) 80 MG tablet Take 1 tablet (80 mg total) by mouth daily at 6 PM.  . citalopram (CELEXA) 20 MG tablet Take 1 tablet (20 mg total) by mouth daily.  Marland Kitchen esomeprazole (NEXIUM) 40 MG capsule Take 40 mg by mouth daily.   . insulin aspart (NOVOLOG) 100 UNIT/ML injection Inject 25 Units into the skin 3 (three) times daily before meals. Take 10 units PLUS sliding scale  . insulin glargine (LANTUS) 100 UNIT/ML injection Inject 1 mL (100 Units total) into the skin at bedtime.  . lidocaine (XYLOCAINE) 2 % jelly Apply 1 application topically as needed.  Marland Kitchen LORazepam (ATIVAN) 1 MG tablet Take 1 tablet (1 mg total) by mouth 2 (two) times daily as needed for anxiety.  . metoprolol succinate (TOPROL-XL) 25 MG 24 hr tablet Take 0.5 tablets (12.5 mg total) by mouth daily.  . ondansetron (ZOFRAN) 4 MG tablet Take 1 tablet (4 mg total) by mouth daily as needed.  Marland Kitchen oxyCODONE (OXY IR/ROXICODONE) 5 MG immediate release tablet Take 1 tablet (5 mg total) by mouth every 6 (six) hours as needed for severe pain or breakthrough pain.  . promethazine (PHENERGAN) 25 MG tablet Take 25 mg by mouth every 6 (six) hours as needed for nausea. for nausea  . ranolazine (RANEXA) 500 MG 12 hr tablet Take 1 tablet (500 mg total) by mouth 2 (two) times daily.  Marland Kitchen terconazole (TERAZOL 3) 0.8 %  vaginal cream Apply intravaginally with applicator for 3 nights and topically to vulva then apply topically to vulva only.  . ticagrelor (BRILINTA) 90 MG TABS tablet Take 90 mg by mouth 2 (two) times daily.    Allergies:   Bee venom; Metformin and related; Darvon [propoxyphene]; Gabapentin; Nsaids; Tramadol; and Contrast media [iodinated diagnostic agents]   Social History:  The patient  reports that Renee Mack quit smoking about 5 months ago. Her smoking use included cigarettes. Renee Mack has a 15.00 pack-year smoking history. Renee Mack has never used smokeless tobacco. Renee Mack reports that Renee Mack does not drink alcohol or use drugs.   Family History:  The patient's family history includes Heart attack in her brother and father; Heart disease  in her brother; Lupus in her sister; Non-Hodgkin's lymphoma in her mother; Osteoporosis in her mother.  ROS:   Review of Systems  Constitutional: Positive for malaise/fatigue. Negative for chills, diaphoresis, fever and weight loss.  HENT: Negative for congestion.   Eyes: Negative for discharge and redness.  Respiratory: Negative for cough, hemoptysis, sputum production, shortness of breath and wheezing.   Cardiovascular: Negative for chest pain, palpitations, orthopnea, claudication, leg swelling and PND.  Gastrointestinal: Negative for abdominal pain, blood in stool, heartburn, melena, nausea and vomiting.  Genitourinary: Negative for hematuria.  Musculoskeletal: Negative for falls and myalgias.  Skin: Negative for rash.  Neurological: Negative for dizziness, tingling, tremors, sensory change, speech change, focal weakness, loss of consciousness and weakness.  Endo/Heme/Allergies: Does not bruise/bleed easily.  Psychiatric/Behavioral: Negative for substance abuse. The patient is not nervous/anxious.   All other systems reviewed and are negative.    PHYSICAL EXAM:  VS:  BP 110/60 (BP Location: Right Arm, Patient Position: Sitting, Cuff Size: Normal)   Pulse 64   Ht 5\' 1"   (1.549 m)   Wt 136 lb (61.7 kg)   BMI 25.70 kg/m  BMI: Body mass index is 25.7 kg/m.  Physical Exam  Constitutional: Renee Mack is oriented to person, place, and time. Renee Mack appears well-developed and well-nourished.  HENT:  Head: Normocephalic and atraumatic.  Eyes: Right eye exhibits no discharge. Left eye exhibits no discharge.  Neck: Normal range of motion. No JVD present.  Cardiovascular: Normal rate, regular rhythm, S1 normal, S2 normal and normal heart sounds. Exam reveals no distant heart sounds, no friction rub, no midsystolic click and no opening snap.  No murmur heard. Pulses:      Posterior tibial pulses are 2+ on the right side and 2+ on the left side.  Right radial cardiac cath site is well-healing without any active bleeding, bruising, swelling, erythema, tenderness to palpation.  Radial pulse 2+.  Pulmonary/Chest: Effort normal and breath sounds normal. No respiratory distress. Renee Mack has no decreased breath sounds. Renee Mack has no wheezes. Renee Mack has no rales. Renee Mack exhibits no tenderness.  Abdominal: Soft. Renee Mack exhibits no distension. There is no abdominal tenderness.  Musculoskeletal:        General: No edema.  Neurological: Renee Mack is alert and oriented to person, place, and time.  Skin: Skin is warm and dry. No cyanosis. Nails show no clubbing.  Psychiatric: Renee Mack has a normal mood and affect. Her speech is normal and behavior is normal. Judgment and thought content normal.     EKG:  Was ordered and interpreted by me today. Shows NSR, 64 bpm, left axis deviation, nonspecific st/t changes  Recent Labs: 03/24/2018: B Natriuretic Peptide 91.0; Magnesium 1.7 05/22/2018: ALT 16; TSH 1.447 05/29/2018: BUN 15; Creatinine, Ser 0.40; Hemoglobin 13.8; Platelets 207; Potassium 4.2; Sodium 136  03/06/2018: Cholesterol 129; HDL 26; LDL Cholesterol 60; Total CHOL/HDL Ratio 5.0; Triglycerides 213; VLDL 43   Estimated Creatinine Clearance: 67.8 mL/min (A) (by C-G formula based on SCr of 0.4 mg/dL (L)).   Wt  Readings from Last 3 Encounters:  06/08/18 136 lb (61.7 kg)  06/07/18 135 lb 3.2 oz (61.3 kg)  05/31/18 139 lb 9.6 oz (63.3 kg)     Other studies reviewed: Additional studies/records reviewed today include: summarized above  ASSESSMENT AND PLAN:  1. CAD involving the native coronary arteries without angina: Renee Mack is doing well without any symptoms concerning for angina.  Continue aspirin 81 mg daily and Brilinta 90 mg twice daily.  Remains on Ranexa 500 mg  twice daily as well as Lipitor 80 mg daily.  Renee Mack indicates that Renee Mack quit smoking at the time of her initial PCI.  Continue aggressive risk factor modification including improvement in diabetes control and secondary prevention.  No plans for further ischemic evaluation at this time.  2. HFrEF secondary to ICM: Renee Mack does not appear grossly volume up.  Most recent echo from 03/2018 showed continued improvement in LVSF with an EF of 45 to 50%.  Remains on Toprol-XL.  Relative hypotension has precluded the addition of ACE inhibitor/ARB/spironolactone.  EF no longer indicates for initiation of Entresto.  CHF education.  3. HLD: LDL of 60 from 02/2018 with normal AST/ALT in 05/2018.  Remains on Lipitor 80 mg daily.  4. Poorly controlled diabetes: Working with a diabetes Social worker.  Per PCP.  5. Relative hypotension: Blood pressure is stable.  Renee Mack does indicate Renee Mack has been taking Toprol-XL 25 mg daily rather than the previously noted 12.5 mg daily.  Renee Mack has been tolerating this without issues.  In this setting, we will change her medication list to say Toprol-XL 25 mg daily.  Relative hypotension precludes addition of long-acting nitrate at this time.  Disposition: F/u with Dr. Fletcher Anon at previously scheduled appointment in 07/2018.  Patient presented to the office wearing copious amounts of fragrant perfume.  I would kindly ask that the patient refrain from wearing this to future appointments.  Current medicines are reviewed at length with the patient  today.  The patient did not have any concerns regarding medicines.  Melvern Banker PA-C 06/08/2018 2:57 PM     [address] [place], [place] [id number] [phone]

## 2018-06-09 LAB — SURGICAL PATHOLOGY

## 2018-06-12 ENCOUNTER — Telehealth: Payer: Self-pay

## 2018-06-12 NOTE — Telephone Encounter (Signed)
Vulvar biopsy results sent to Dr.'s Theora Gianotti and [facility] for review.  Surgical Pathology  CASE: [id number]  PATIENT: Renee Mack  Surgical Pathology Report      SPECIMEN SUBMITTED:  A. Vulva   CLINICAL HISTORY:  History of superficially invasive SCC of the vulva   PRE-OPERATIVE DIAGNOSIS:  None provided   POST-OPERATIVE DIAGNOSIS:  None provided.      DIAGNOSIS:  A. VULVA; BIOPSY:  - AT LEAST HIGH-GRADE SQUAMOUS INTRAEPITHELIAL LESION (VIN 3; SEVERE  DYSPLASIA).  - DUE TO FRAGMENTATION AND TANGENTIAL TISSUE ORIENTATION, DEFINITIVE  INVASION CANNOT BE RELIABLY ASSESSED.    GROSS DESCRIPTION:  A. Labeled: Vulvar  Received: In formalin  Tissue fragment(s): 3  Size: two are 0.1 x 0.1 x 0.1 cm and 0.5 x 0.5 x 0.2 cm  Description: Tan to brown unoriented fragments of tissue, the largest  mottled brown, is marked blue and bisected  Entirely submitted in 1 cassette.     Final Diagnosis performed by Allena Napoleon, MD.  Electronically signed  06/09/2018 4:33:17PM  The electronic signature indicates that the named Attending Pathologist  has evaluated the specimen  Technical component performed at [facility], [address], [place],  [place] [address] Lab: [phone] Dir: Rush Farmer, MD, MMM Professional  component performed at [facility], [facility], [place], [place], [place] [address] Lab: [phone] Dir: Dellia Nims.  Reuel Derby, MD

## 2018-06-13 ENCOUNTER — Inpatient Hospital Stay
Admission: EM | Admit: 2018-06-13 | Discharge: 2018-06-16 | DRG: 287 | Disposition: A | Discharge status: Home / Self Care | Payer: Medicaid Other | Attending: Internal Medicine | Admitting: Internal Medicine

## 2018-06-13 ENCOUNTER — Other Ambulatory Visit: Payer: Self-pay

## 2018-06-13 ENCOUNTER — Telehealth: Payer: Self-pay

## 2018-06-13 ENCOUNTER — Emergency Department: Payer: Medicaid Other

## 2018-06-13 DIAGNOSIS — Z8249 Family history of ischemic heart disease and other diseases of the circulatory system: Secondary | ICD-10-CM

## 2018-06-13 DIAGNOSIS — I255 Ischemic cardiomyopathy: Secondary | ICD-10-CM | POA: Diagnosis present

## 2018-06-13 DIAGNOSIS — Z9081 Acquired absence of spleen: Secondary | ICD-10-CM

## 2018-06-13 DIAGNOSIS — K219 Gastro-esophageal reflux disease without esophagitis: Secondary | ICD-10-CM | POA: Diagnosis present

## 2018-06-13 DIAGNOSIS — Z87891 Personal history of nicotine dependence: Secondary | ICD-10-CM

## 2018-06-13 DIAGNOSIS — J449 Chronic obstructive pulmonary disease, unspecified: Secondary | ICD-10-CM | POA: Diagnosis present

## 2018-06-13 DIAGNOSIS — Z955 Presence of coronary angioplasty implant and graft: Secondary | ICD-10-CM

## 2018-06-13 DIAGNOSIS — J9811 Atelectasis: Secondary | ICD-10-CM | POA: Diagnosis present

## 2018-06-13 DIAGNOSIS — I513 Intracardiac thrombosis, not elsewhere classified: Secondary | ICD-10-CM | POA: Diagnosis present

## 2018-06-13 DIAGNOSIS — F418 Other specified anxiety disorders: Secondary | ICD-10-CM

## 2018-06-13 DIAGNOSIS — I200 Unstable angina: Secondary | ICD-10-CM

## 2018-06-13 DIAGNOSIS — M4802 Spinal stenosis, cervical region: Secondary | ICD-10-CM | POA: Diagnosis present

## 2018-06-13 DIAGNOSIS — Z885 Allergy status to narcotic agent status: Secondary | ICD-10-CM

## 2018-06-13 DIAGNOSIS — Z79899 Other long term (current) drug therapy: Secondary | ICD-10-CM

## 2018-06-13 DIAGNOSIS — Z794 Long term (current) use of insulin: Secondary | ICD-10-CM

## 2018-06-13 DIAGNOSIS — J069 Acute upper respiratory infection, unspecified: Secondary | ICD-10-CM | POA: Diagnosis present

## 2018-06-13 DIAGNOSIS — I5042 Chronic combined systolic (congestive) and diastolic (congestive) heart failure: Secondary | ICD-10-CM | POA: Diagnosis present

## 2018-06-13 DIAGNOSIS — Z807 Family history of other malignant neoplasms of lymphoid, hematopoietic and related tissues: Secondary | ICD-10-CM

## 2018-06-13 DIAGNOSIS — Z91030 Bee allergy status: Secondary | ICD-10-CM

## 2018-06-13 DIAGNOSIS — I252 Old myocardial infarction: Secondary | ICD-10-CM

## 2018-06-13 DIAGNOSIS — Z832 Family history of diseases of the blood and blood-forming organs and certain disorders involving the immune mechanism: Secondary | ICD-10-CM

## 2018-06-13 DIAGNOSIS — Z90711 Acquired absence of uterus with remaining cervical stump: Secondary | ICD-10-CM

## 2018-06-13 DIAGNOSIS — I25110 Atherosclerotic heart disease of native coronary artery with unstable angina pectoris: Principal | ICD-10-CM | POA: Diagnosis present

## 2018-06-13 DIAGNOSIS — Z8589 Personal history of malignant neoplasm of other organs and systems: Secondary | ICD-10-CM

## 2018-06-13 DIAGNOSIS — Z888 Allergy status to other drugs, medicaments and biological substances status: Secondary | ICD-10-CM

## 2018-06-13 DIAGNOSIS — Z8262 Family history of osteoporosis: Secondary | ICD-10-CM

## 2018-06-13 DIAGNOSIS — E1140 Type 2 diabetes mellitus with diabetic neuropathy, unspecified: Secondary | ICD-10-CM | POA: Diagnosis present

## 2018-06-13 DIAGNOSIS — Z7902 Long term (current) use of antithrombotics/antiplatelets: Secondary | ICD-10-CM

## 2018-06-13 DIAGNOSIS — R0789 Other chest pain: Secondary | ICD-10-CM

## 2018-06-13 DIAGNOSIS — Z7982 Long term (current) use of aspirin: Secondary | ICD-10-CM

## 2018-06-13 DIAGNOSIS — Z91041 Radiographic dye allergy status: Secondary | ICD-10-CM

## 2018-06-13 DIAGNOSIS — I700 Atherosclerosis of aorta: Secondary | ICD-10-CM | POA: Diagnosis present

## 2018-06-13 DIAGNOSIS — E785 Hyperlipidemia, unspecified: Secondary | ICD-10-CM | POA: Diagnosis present

## 2018-06-13 DIAGNOSIS — R079 Chest pain, unspecified: Secondary | ICD-10-CM | POA: Diagnosis present

## 2018-06-13 LAB — GLUCOSE, CAPILLARY
Glucose-Capillary: 121 mg/dL — ABNORMAL HIGH (ref 70–99)
Glucose-Capillary: 249 mg/dL — ABNORMAL HIGH (ref 70–99)
Glucose-Capillary: 308 mg/dL — ABNORMAL HIGH (ref 70–99)
Glucose-Capillary: 73 mg/dL (ref 70–99)

## 2018-06-13 LAB — TROPONIN I
Troponin I: <0.03 ng/mL (ref <0.03)
Troponin I: <0.03 ng/mL (ref <0.03)
Troponin I: <0.03 ng/mL (ref <0.03)

## 2018-06-13 LAB — CBC
HCT: 41.6 % (ref 36.0–46.0)
Hemoglobin: 13.6 g/dL (ref 12.0–15.0)
MCH: 31.2 pg (ref 26.0–34.0)
MCHC: 32.7 g/dL (ref 30.0–36.0)
MCV: 95.4 fL (ref 80.0–100.0)
Platelets: 228 10*3/uL (ref 150–400)
RBC: 4.36 MIL/uL (ref 3.87–5.11)
RDW: 13.4 % (ref 11.5–15.5)
WBC: 9 10*3/uL (ref 4.0–10.5)
nRBC: 0 % (ref 0.0–0.2)

## 2018-06-13 LAB — BASIC METABOLIC PANEL
Anion gap: 8 (ref 5–15)
BUN: 20 mg/dL (ref 6–20)
CO2: 23 mmol/L (ref 22–32)
Calcium: 8.8 mg/dL — ABNORMAL LOW (ref 8.9–10.3)
Chloride: 104 mmol/L (ref 98–111)
Creatinine, Ser: 0.82 mg/dL (ref 0.44–1.00)
GFR calc Af Amer: >60 mL/min (ref >60)
GFR calc non Af Amer: >60 mL/min (ref >60)
GLUCOSE: 372 mg/dL — AB (ref 70–99)
Potassium: 4.1 mmol/L (ref 3.5–5.1)
Sodium: 135 mmol/L (ref 135–145)

## 2018-06-13 MED ORDER — INSULIN ASPART 100 UNIT/ML ~~LOC~~ SOLN
25.0000 [IU] | Freq: Three times a day (TID) | SUBCUTANEOUS | Status: DC
  Administered 2018-06-13 – 2018-06-16 (×6): 25 [IU] via SUBCUTANEOUS
  Filled 2018-06-13 (×6): qty 1

## 2018-06-13 MED ORDER — HYDROCODONE-ACETAMINOPHEN 5-325 MG PO TABS
1.0000 | ORAL_TABLET | Freq: Four times a day (QID) | ORAL | Status: DC | PRN
  Administered 2018-06-13 – 2018-06-16 (×9): 2 via ORAL
  Filled 2018-06-13 (×8): qty 2

## 2018-06-13 MED ORDER — ONDANSETRON HCL 4 MG/2ML IJ SOLN
4.0000 mg | Freq: Once | INTRAMUSCULAR | Status: AC
  Administered 2018-06-13: 4 mg via INTRAVENOUS
  Filled 2018-06-13: qty 2

## 2018-06-13 MED ORDER — MORPHINE SULFATE (PF) 4 MG/ML IV SOLN
4.0000 mg | Freq: Once | INTRAVENOUS | Status: AC
  Administered 2018-06-13: 4 mg via INTRAVENOUS

## 2018-06-13 MED ORDER — TICAGRELOR 90 MG PO TABS
90.0000 mg | ORAL_TABLET | Freq: Two times a day (BID) | ORAL | Status: DC
  Administered 2018-06-13 – 2018-06-16 (×7): 90 mg via ORAL
  Filled 2018-06-13 (×7): qty 1

## 2018-06-13 MED ORDER — ENOXAPARIN SODIUM 40 MG/0.4ML ~~LOC~~ SOLN
40.0000 mg | SUBCUTANEOUS | Status: DC
  Administered 2018-06-13 – 2018-06-14 (×2): 40 mg via SUBCUTANEOUS
  Filled 2018-06-13 (×2): qty 0.4

## 2018-06-13 MED ORDER — LORAZEPAM 1 MG PO TABS
1.0000 mg | ORAL_TABLET | Freq: Two times a day (BID) | ORAL | Status: DC | PRN
  Administered 2018-06-13 – 2018-06-16 (×6): 1 mg via ORAL
  Filled 2018-06-13 (×6): qty 1

## 2018-06-13 MED ORDER — ONDANSETRON HCL 4 MG PO TABS: 4.0000 mg | ORAL_TABLET | Freq: Three times a day (TID) | ORAL | Status: DC | PRN

## 2018-06-13 MED ORDER — PROMETHAZINE HCL 25 MG PO TABS
25.0000 mg | ORAL_TABLET | Freq: Four times a day (QID) | ORAL | Status: DC | PRN
  Administered 2018-06-14 – 2018-06-16 (×5): 25 mg via ORAL
  Filled 2018-06-13 (×8): qty 1

## 2018-06-13 MED ORDER — ALBUTEROL SULFATE HFA 108 (90 BASE) MCG/ACT IN AERS: 1.0000 | INHALATION_SPRAY | Freq: Four times a day (QID) | RESPIRATORY_TRACT | Status: DC | PRN

## 2018-06-13 MED ORDER — CITALOPRAM HYDROBROMIDE 20 MG PO TABS
20.0000 mg | ORAL_TABLET | Freq: Every day | ORAL | Status: DC
  Administered 2018-06-13 – 2018-06-16 (×4): 20 mg via ORAL
  Filled 2018-06-13 (×4): qty 1

## 2018-06-13 MED ORDER — RANOLAZINE ER 500 MG PO TB12
500.0000 mg | ORAL_TABLET | Freq: Two times a day (BID) | ORAL | Status: DC
  Filled 2018-06-13 (×2): qty 1

## 2018-06-13 MED ORDER — MORPHINE SULFATE (PF) 2 MG/ML IV SOLN
2.0000 mg | INTRAVENOUS | Status: DC | PRN
  Administered 2018-06-13 – 2018-06-16 (×10): 2 mg via INTRAVENOUS
  Filled 2018-06-13 (×10): qty 1

## 2018-06-13 MED ORDER — PANTOPRAZOLE SODIUM 40 MG PO TBEC
40.0000 mg | DELAYED_RELEASE_TABLET | Freq: Every day | ORAL | Status: DC
  Administered 2018-06-13 – 2018-06-16 (×4): 40 mg via ORAL
  Filled 2018-06-13 (×4): qty 1

## 2018-06-13 MED ORDER — ASPIRIN EC 81 MG PO TBEC
81.0000 mg | DELAYED_RELEASE_TABLET | Freq: Every day | ORAL | Status: DC
  Administered 2018-06-14 – 2018-06-16 (×3): 81 mg via ORAL
  Filled 2018-06-13 (×4): qty 1

## 2018-06-13 MED ORDER — ALBUTEROL SULFATE (2.5 MG/3ML) 0.083% IN NEBU: 2.5000 mg | INHALATION_SOLUTION | Freq: Four times a day (QID) | RESPIRATORY_TRACT | Status: DC | PRN

## 2018-06-13 MED ORDER — SODIUM CHLORIDE 0.9 % IV BOLUS
1000.0000 mL | Freq: Once | INTRAVENOUS | Status: AC
  Administered 2018-06-13: 1000 mL via INTRAVENOUS

## 2018-06-13 MED ORDER — INSULIN ASPART 100 UNIT/ML ~~LOC~~ SOLN
0.0000 [IU] | Freq: Every day | SUBCUTANEOUS | Status: DC
  Administered 2018-06-14 – 2018-06-15 (×2): 2 [IU] via SUBCUTANEOUS
  Filled 2018-06-13 (×2): qty 1

## 2018-06-13 MED ORDER — INSULIN GLARGINE 100 UNIT/ML ~~LOC~~ SOLN
50.0000 [IU] | Freq: Two times a day (BID) | SUBCUTANEOUS | Status: DC
  Administered 2018-06-13 – 2018-06-16 (×7): 50 [IU] via SUBCUTANEOUS
  Filled 2018-06-13 (×10): qty 0.5

## 2018-06-13 MED ORDER — ONDANSETRON HCL 4 MG/2ML IJ SOLN
4.0000 mg | Freq: Four times a day (QID) | INTRAMUSCULAR | Status: DC | PRN
  Administered 2018-06-15 – 2018-06-16 (×2): 4 mg via INTRAVENOUS
  Filled 2018-06-13 (×2): qty 2

## 2018-06-13 MED ORDER — INSULIN ASPART 100 UNIT/ML ~~LOC~~ SOLN
0.0000 [IU] | Freq: Three times a day (TID) | SUBCUTANEOUS | Status: DC
  Administered 2018-06-13: 7 [IU] via SUBCUTANEOUS
  Administered 2018-06-13: 3 [IU] via SUBCUTANEOUS
  Administered 2018-06-14 (×2): 2 [IU] via SUBCUTANEOUS
  Administered 2018-06-15 (×2): 5 [IU] via SUBCUTANEOUS
  Administered 2018-06-16: 2 [IU] via SUBCUTANEOUS
  Filled 2018-06-13 (×7): qty 1

## 2018-06-13 MED ORDER — NITROGLYCERIN 0.4 MG SL SUBL: 0.4000 mg | SUBLINGUAL_TABLET | SUBLINGUAL | Status: DC | PRN

## 2018-06-13 MED ORDER — ACETAMINOPHEN 325 MG PO TABS: 650.0000 mg | ORAL_TABLET | ORAL | Status: DC | PRN

## 2018-06-13 MED ORDER — MORPHINE SULFATE (PF) 4 MG/ML IV SOLN
INTRAVENOUS | Status: AC
  Filled 2018-06-13: qty 1

## 2018-06-13 MED ORDER — MORPHINE SULFATE (PF) 4 MG/ML IV SOLN
4.0000 mg | Freq: Once | INTRAVENOUS | Status: AC
  Administered 2018-06-13: 4 mg via INTRAVENOUS
  Filled 2018-06-13: qty 1

## 2018-06-13 MED ORDER — ATORVASTATIN CALCIUM 80 MG PO TABS
80.0000 mg | ORAL_TABLET | Freq: Every day | ORAL | Status: DC
  Administered 2018-06-13 – 2018-06-15 (×3): 80 mg via ORAL
  Filled 2018-06-13: qty 1
  Filled 2018-06-13 (×2): qty 4
  Filled 2018-06-13: qty 1
  Filled 2018-06-13: qty 4
  Filled 2018-06-13 (×2): qty 1

## 2018-06-13 MED ORDER — METOPROLOL SUCCINATE ER 25 MG PO TB24
25.0000 mg | ORAL_TABLET | Freq: Every day | ORAL | Status: DC
  Filled 2018-06-13: qty 1

## 2018-06-13 MED ORDER — RANOLAZINE ER 500 MG PO TB12
1000.0000 mg | ORAL_TABLET | Freq: Two times a day (BID) | ORAL | Status: DC
  Administered 2018-06-13 – 2018-06-16 (×7): 1000 mg via ORAL
  Filled 2018-06-13 (×9): qty 2

## 2018-06-13 MED ORDER — SODIUM CHLORIDE 0.9% FLUSH
10.0000 mL | Freq: Two times a day (BID) | INTRAVENOUS | Status: DC
  Administered 2018-06-13 – 2018-06-16 (×6): 10 mL via INTRAVENOUS

## 2018-06-13 NOTE — ED Notes (Signed)
Pt assisted to toilet to void 

## 2018-06-13 NOTE — ED Notes (Signed)
Attempted to call report and was advised that they would return the call

## 2018-06-13 NOTE — Consult Note (Signed)
Cardiology Consultation:   Patient ID: Renee Mack; [id number]; Mar 24, 1966   Admit date: 06/13/2018 Date of Consult: 06/13/2018  Primary Care Provider: Olin Hauser, DO Primary Cardiologist: Fletcher Anon   Patient Profile:   Renee Mack is a 52 y.o. female with a hx of CAD s/pMI and drug-eluting stent placement to LAD x3 in7/2019,with subsequent readmission for subacute stent thrombosis and repeat PCI x3 s/p PTCA most recently in 38/7564, combined systolic and diastolic congestive heart failure, ischemic cardiomyopathy with EF of 45 to 50%, former tobacco abuse, COPD, hyperlipidemia, GERD,poorly controlledtype 2 diabetes mellitus, and pancreatitis who is being seen today for the evaluation of chest pain at the request of Dr. Margaretmary Eddy.  History of Present Illness:   [name] has had a complicated few months following initial STEMI the first of July for which she underwent cardiac cath that showedLAD 100p, 60m, 80d, RCA 90p (non-dominant).She required three DES to the LAD. Her recovery was complicated by cardiogenic shock and hypotension requiring vasopressors. Following initial MI, EF was reduced to 30-35% and was provided a LifeVest at discharge. This was followed by two separate admissions for chest, neck, arm, and back pain with associated nausea and vomiting. She underwent a cardiac cath, once in July and again in August. Each requiring PCI and stent placement related to ISR of the LAD.Upon cath in 82/019, under-expansion of prior LAD stents was noted.For recurrent pain, it was recommended to consider PCI of the nondominant RCA with possible hemodynamic assessment of the coronary-pulmonary artery fisulae to exclude coronary steal phenomenon.Repeat echo showed continued improvement, with latest echo showing an EF of 45-50% in October. Her LifeVest was able to be discontinued at that time. She has had issues with neck, arm and back pain related to cervical disc disease that  was found on MRI in August and was admitted again in October for the same pain. Her cardiac work up was negative at that time, her pain was reproducible with movementand attributed to cervical disc disease.She was last seen in the office on 05/03/2018, and was doing very well at that time. She has been noted to have relative hypotension which has precluded the addition of long-acting nitrate therapy.   She was admitted to the hospital on 12/2 with sudden onset of severe chest pain rated 8 out of 10 that began around 2 to 2:30 in the morning with associated shortness of breath, diaphoresis, nausea, and one episode of emesis.  EKG showed nonspecific ST-T changes.  Cardiac enzymes remain negative.  Chest x-ray showed no acute disease.  She underwent repeat cardiac cath on 05/22/2018 that showed significant underlying three-vessel CAD with patent LAD stents except in the mid to distal segment where there was 95% stenosis that appeared to be a combination of restenosis and possible localized thrombus.  She was noted to have stable proximal RCA disease in a nondominant vessel.  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  She underwent successful PTCA to the mid LAD with noncompliant balloon high pressure.  She was noted to have normal LVEDP.  She was readmitted to the hospital from 12/8-12/9 with intractable nausea and vomiting and was treated for hyperglycemia in the setting of poorly controlled diabetes with blood glucose greater than 500.  Cardiac enzyme was negative.  In hospital follow up on 12/19 she was doing well and denied any further chest pain or SOB.  She was compliant with medications.   Patient  states since she was seen on 12/19, she has noted some mild intermittent chest pain that was not as bad as prior episodes. She was woken up this morning with substernal chest pain that was rated an 8/10, and exacerbated by coughing. Pain was similar to  prior episodes. She has been compliant with all medications. There again is associated neck and shoulder pain. No increased SOB, diaphoresis, nausea, vomiting, dizziness, presyncope, or syncope.  EKG unchanged from prior as below, CXR showed aortic atherosclerosis with slight bibasilar atelectasis without edema or consolidation. Labs showed troponin negative x 1, unremarkable CBC, K+ 4.1 with hemolysis, glucose 372, SCr 0.82. In the ED she was given morphine, Zofran and IV fluids. Upon admission, cardiology was asked to evaluate. Currently, continues to note chest pain that is worsened by coughing and reproducible to her palpation.    Past Medical History:  Diagnosis Date  . Cancer ([place])    vulvular  . Cervical disc disease    a. 01/2018 MRI Cervical spine: Cervical spondylosis with multilevel disc and facet degeneration greatest at C5-6 and C6-7.  Moderate to sev R C5-6, mod L C5-6, and mod bilat C6-7 foraminal stenosis with multilevel mild foraminal stenosis.  Mild C5-6 and mod C6-7 canal stenosis. C6-7 central cord impingement w/ cord flattening.  . Chronic combined systolic (congestive) and diastolic (congestive) heart failure ([place])    a. 12/2017 Echo: EF 30-35%, mid-apicalanteroseptal, ant, and apical AK. Gr1 DD. Mild conc LVH; b. 03/2018 Echo: EF 45-50%, antsept, ant HK. Mild MR. Nl LA size. Nl RV fxn.  Marland Kitchen COPD (chronic obstructive pulmonary disease) ([facility])    not on home oxygen  . Coronary artery disease    a. 12/2017 ACS/PCI: LAD 100p (2.25x26 Resolute Onyx DES), 45m (2.0x12 Resolute Onyx DES), 80d (2.0x15 Resolute Onyx DES), RCA 90p (non-dominant). EF 25-35%. Post-MI course complicated by CGS; b. 10/2839 NSTEMI/subacute thrombosis-->LAD 100 (PTCA + DES x 1); c. 01/2018 NSTEMI/PCI: LM min irregs, LAD 50-60p ISR/hazy (3.5x12 Resolute DES), 78m/d (underexpansion of prior stents-->PTCA). EF 45-50%.  . Diabetes mellitus without complication ([place])   . GERD (gastroesophageal reflux disease)   . H/O  degenerative disc disease   . Hypotension   . Ischemic cardiomyopathy    a. 12/2017 Echo: EF 30-35%; b. 03/2018 Echo: EF 45-50%.  . Myocardial infarction ([place])    a. 12/2017-->DES to LAD x 3.  . Pancreatitis   . Shingles   . Spinal stenosis   . Tobacco abuse   . Ulcer (traumatic) of oral mucosa   . Urinary incontinence     Past Surgical History:  Procedure Laterality Date  . ABDOMINAL HYSTERECTOMY     partial  . CORONARY BALLOON ANGIOPLASTY N/A 05/22/2018   Procedure: CORONARY BALLOON ANGIOPLASTY;  Surgeon: Wellington Hampshire, MD;  Location: [facility];  Service: Cardiovascular;  Laterality: N/A;  . CORONARY STENT INTERVENTION N/A 12/26/2017   Procedure: CORONARY STENT INTERVENTION;  Surgeon: Wellington Hampshire, MD;  Location: [facility];  Service: Cardiovascular;  Laterality: N/A;  . CORONARY STENT INTERVENTION N/A 02/08/2018   Procedure: CORONARY STENT INTERVENTION;  Surgeon: Nelva Bush, MD;  Location: [facility];  Service: Cardiovascular;  Laterality: N/A;  . CORONARY/GRAFT ACUTE MI REVASCULARIZATION N/A 12/19/2017   Procedure: Coronary/Graft Acute MI Revascularization;  Surgeon: Wellington Hampshire, MD;  Location: [facility];  Service: Cardiovascular;  Laterality: N/A;  . ECTOPIC PREGNANCY SURGERY Left   . INTRAVASCULAR ULTRASOUND/IVUS N/A 02/08/2018   Procedure: Intravascular Ultrasound/IVUS;  Surgeon: Nelva Bush, MD;  Location: [facility];  Service: Cardiovascular;  Laterality: N/A;  . LEFT HEART CATH AND CORONARY ANGIOGRAPHY N/A 12/19/2017   Procedure: LEFT HEART CATH AND CORONARY ANGIOGRAPHY;  Surgeon: Wellington Hampshire, MD;  Location: [facility];  Service: Cardiovascular;  Laterality: N/A;  . LEFT HEART CATH AND CORONARY ANGIOGRAPHY N/A 12/26/2017   Procedure: LEFT HEART CATH AND CORONARY ANGIOGRAPHY;  Surgeon: Wellington Hampshire, MD;  Location: [facility];  Service: Cardiovascular;  Laterality: N/A;  . LEFT  HEART CATH AND CORONARY ANGIOGRAPHY N/A 02/08/2018   Procedure: LEFT HEART CATH AND CORONARY ANGIOGRAPHY;  Surgeon: Nelva Bush, MD;  Location: [facility];  Service: Cardiovascular;  Laterality: N/A;  . LEFT HEART CATH AND CORONARY ANGIOGRAPHY N/A 05/22/2018   Procedure: LEFT HEART CATH AND CORONARY ANGIOGRAPHY;  Surgeon: Wellington Hampshire, MD;  Location: [facility];  Service: Cardiovascular;  Laterality: N/A;  . LYMPHADENECTOMY    . SPLENECTOMY, PARTIAL    . vulvulasectomy       Home Meds: Prior to Admission medications   Medication Sig Start Date End Date Taking? Authorizing Provider  acetaminophen (TYLENOL) 325 MG tablet Take 650 mg by mouth every 6 (six) hours as needed for mild pain.    Yes [provider]  aspirin 81 MG tablet Take 81 mg by mouth daily.   Yes [provider]  atorvastatin (LIPITOR) 80 MG tablet Take 1 tablet (80 mg total) by mouth daily at 6 PM. 01/10/18 03/25/19 Yes Theora Gianotti, NP  citalopram (CELEXA) 20 MG tablet Take 1 tablet (20 mg total) by mouth daily. 03/29/18  Yes Karamalegos, Devonne Doughty, DO  esomeprazole (NEXIUM) 40 MG capsule Take 40 mg by mouth daily.    Yes [provider]  insulin aspart (NOVOLOG) 100 UNIT/ML injection Inject 25 Units into the skin 3 (three) times daily before meals. Take 10 units PLUS sliding scale Patient taking differently: Inject 35 Units into the skin 3 (three) times daily before meals. Take 10 units PLUS sliding scale  05/29/18 06/29/18 Yes Mody, Sital, MD  insulin glargine (LANTUS) 100 UNIT/ML injection Inject 1 mL (100 Units total) into the skin at bedtime. Patient taking differently: Inject 60 Units into the skin 2 (two) times daily.  05/29/18 06/29/18 Yes Mody, Sital, MD  LORazepam (ATIVAN) 1 MG tablet Take 1 tablet (1 mg total) by mouth 2 (two) times daily as needed for anxiety. 05/31/18  Yes Karamalegos, Devonne Doughty, DO  metoprolol succinate (TOPROL-XL) 25 MG 24 hr tablet  Take 1 tablet (25 mg total) by mouth daily. 06/08/18  Yes Deegan Valentino, Areta Haber, PA-C  promethazine (PHENERGAN) 25 MG tablet Take 25 mg by mouth every 6 (six) hours as needed for nausea. for nausea 05/15/18  Yes [provider]  ranolazine (RANEXA) 500 MG 12 hr tablet Take 1 tablet (500 mg total) by mouth 2 (two) times daily. 03/09/18  Yes Minna Merritts, MD  ticagrelor (BRILINTA) 90 MG TABS tablet Take 90 mg by mouth 2 (two) times daily.   Yes [provider]  albuterol (PROAIR HFA) 108 (90 Base) MCG/ACT inhaler Inhale 1 puff into the lungs every 6 (six) hours as needed for wheezing or shortness of breath.     [provider]  lidocaine (XYLOCAINE) 2 % jelly Apply 1 application topically as needed. 05/24/18   Verlon Au, NP  ondansetron (ZOFRAN) 4 MG tablet Take 1 tablet (4 mg total) by mouth  daily as needed. Patient not taking: Reported on 06/13/2018 01/05/18   Orbie Pyo, MD  oxyCODONE (OXY IR/ROXICODONE) 5 MG immediate release tablet Take 1 tablet (5 mg total) by mouth every 6 (six) hours as needed for severe pain or breakthrough pain. Patient not taking: Reported on 06/13/2018 06/07/18   Verlon Au, NP  terconazole (TERAZOL 3) 0.8 % vaginal cream Apply intravaginally with applicator for 3 nights and topically to vulva then apply topically to vulva only. Patient not taking: Reported on 06/13/2018 05/24/18   Verlon Au, NP    Inpatient Medications: Scheduled Meds: . enoxaparin (LOVENOX) injection  40 mg Subcutaneous Q24H  . insulin aspart  0-5 Units Subcutaneous QHS  . insulin aspart  0-9 Units Subcutaneous TID WC  . insulin glargine  50 Units Subcutaneous BID   Continuous Infusions:  PRN Meds: acetaminophen, ondansetron (ZOFRAN) IV  Allergies:   Allergies  Allergen Reactions  . Bee Venom Itching, Shortness Of Breath and Swelling  . Metformin And Related Shortness Of Breath  . Darvon [Propoxyphene] Itching  . Gabapentin Swelling  .  Nsaids Other (See Comments)    Ulcers   . Tramadol Hives  . Contrast Media [Iodinated Diagnostic Agents] Rash    If you benadryl, and steroids she is able to take the contrast per pt    Social History:   Social History   Socioeconomic History  . Marital status: Widowed    Spouse name: Not on file  . Number of children: 4  . Years of education: Not on file  . Highest education level: Not on file  Occupational History  . Occupation: disabled  Social Needs  . Financial resource strain: Not very hard  . Food insecurity:    Worry: Never true    Inability: Never true  . Transportation needs:    Medical: No    Non-medical: No  Tobacco Use  . Smoking status: Former Smoker    Packs/day: 0.50    Years: 30.00    Pack years: 15.00    Types: Cigarettes    Last attempt to quit: 12/19/2017    Years since quitting: 0.4  . Smokeless tobacco: Never Used  . Tobacco comment: quit 12/19/2017.  Substance and Sexual Activity  . Alcohol use: No  . Drug use: No  . Sexual activity: Not Currently  Lifestyle  . Physical activity:    Days per week: 7 days    Minutes per session: 30 min  . Stress: Very much  Relationships  . Social connections:    Talks on phone: More than three times a week    Gets together: Patient refused    Attends religious service: Never    Active member of club or organization: No    Attends meetings of clubs or organizations: Never    Relationship status: Widowed  . Intimate partner violence:    Fear of current or ex partner: No    Emotionally abused: No    Physically abused: No    Forced sexual activity: No  Other Topics Concern  . Not on file  Social History Narrative   Lives in [place] by herself.  Does not routinely exercise.     Family History:   Family History  Problem Relation Age of Onset  . Non-Hodgkin's lymphoma Mother   . Osteoporosis Mother   . Lupus Sister   . Heart disease Brother   . Heart attack Brother   . Heart attack Father      ROS:  Review of Systems  Constitutional: Positive for malaise/fatigue. Negative for chills, diaphoresis, fever and weight loss.  HENT: Negative for congestion.   Eyes: Negative for discharge and redness.  Respiratory: Positive for cough and shortness of breath. Negative for hemoptysis, sputum production and wheezing.   Cardiovascular: Positive for chest pain. Negative for palpitations, orthopnea, claudication, leg swelling and PND.  Gastrointestinal: Negative for abdominal pain, blood in stool, heartburn, melena, nausea and vomiting.  Genitourinary: Negative for hematuria.  Musculoskeletal: Positive for back pain, joint pain, myalgias and neck pain. Negative for falls.  Skin: Negative for rash.  Neurological: Positive for weakness. Negative for dizziness, tingling, tremors, sensory change, speech change, focal weakness and loss of consciousness.  Endo/Heme/Allergies: Does not bruise/bleed easily.  Psychiatric/Behavioral: Negative for substance abuse. The patient is not nervous/anxious.   All other systems reviewed and are negative.     Physical Exam/Data:   Vitals:   06/13/18 0730 06/13/18 0800 06/13/18 0830 06/13/18 0900  BP: 114/62 112/63 (!) 101/57 (!) 89/42  Pulse: 73 76 71 69  Resp: (!) 22 (!) 25 (!) 21 (!) 22  Temp:      TempSrc:      SpO2: 93% 95% 95% 95%  Weight:      Height:        Intake/Output Summary (Last 24 hours) at 06/13/2018 1104 Last data filed at 06/13/2018 1009 Gross per 24 hour  Intake 1000 ml  Output -  Net 1000 ml   Filed Weights   06/13/18 0714  Weight: 61.2 kg   Body mass index is 25.51 kg/m.   Physical Exam: General: Well developed, well nourished, in no acute distress. Chest pain is exacerbated by coughing in the exam room in the ED.  Head: Normocephalic, atraumatic, sclera non-icteric, no xanthomas, nares without discharge.  Neck: Negative for carotid bruits. JVD not elevated. Lungs: Clear bilaterally to auscultation without wheezes,  rales, or rhonchi. Breathing is unlabored. Heart: RRR with S1 S2. No murmurs, rubs, or gallops appreciated. Palpation of the anterior chest wall fully reproduces the patient's chest pain.  Abdomen: Soft, non-tender, non-distended with normoactive bowel sounds. No hepatomegaly. No rebound/guarding. No obvious abdominal masses. Msk:  Strength and tone appear normal for age. Extremities: No clubbing or cyanosis. No edema. Distal pedal pulses are 2+ and equal bilaterally. Neuro: Alert and oriented X 3. No facial asymmetry. No focal deficit. Moves all extremities spontaneously. Psych:  Responds to questions appropriately with a normal affect.   EKG:  The EKG was personally reviewed and demonstrates: NSR, 74 bpm, prior anteroseptal infarct, anterolateral TWI (mostly unchanged from prior) Telemetry:  Telemetry was personally reviewed and demonstrates: NSR  Weights: [employer]   06/13/18 0714  Weight: 61.2 kg    Relevant CV Studies: Echo 03/2018: Study Conclusions  - Left ventricle: The cavity size was normal. Systolic function was   mildly reduced. The estimated ejection fraction was in the range   of 45% to 50%. Hypokinesis of the anteroseptal myocardium.   Hypokinesis of the anterior myocardium. Left ventricular   diastolic function parameters were normal. - Mitral valve: There was mild regurgitation. - Left atrium: The atrium was normal in size. - Right ventricle: Systolic function was normal. - Pulmonary arteries: Systolic pressure could not be accurately   estimated.  Impressions:  - Compared to previous study, EF has improved. __________  LHC 05/22/2018: Conclusion     Ost 2nd Diag to 2nd Diag lesion is 90% stenosed.  Previously placed Prox LAD drug eluting stent is  widely patent.  Prox Cx lesion is 30% stenosed.  Prox RCA lesion is 90% stenosed.  Mid LAD lesion is 95% stenosed.  Previously placed Mid LAD to Dist LAD stent (unknown type) is widely  patent.  Post intervention, there is a 0% residual stenosis.  Balloon angioplasty was performed using a BALLOON [place] [id number].  LV end diastolic pressure is normal.   1.  Significant underlying three-vessel coronary artery disease with patent LAD stents except in the mid to distal segment where there is 95% stenosis that appears to be a combination of restenosis and possible localized thrombosis (given that that lesion was soft to dilation).  Stable proximal RCA disease in a nondominant vessel.  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  2.  Normal left ventricular end-diastolic pressure.  Left ventricular angiography was not performed. 3.  Successful balloon angioplasty to the mid LAD with noncompliant balloon high pressure.  Recommendations: Continue indefinite dual antiplatelet therapy. Aggressive treatment of risk factors.  Likely discharge home tomorrow.    Laboratory Data:  Chemistry Recent Labs  Lab 06/13/18 0728  NA 135  K 4.1  CL 104  CO2 23  GLUCOSE 372*  BUN 20  CREATININE 0.82  CALCIUM 8.8*  GFRNONAA >60  GFRAA >60  ANIONGAP 8    No results for input(s): PROT, ALBUMIN, AST, ALT, ALKPHOS, BILITOT in the last 168 hours. Hematology Recent Labs  Lab 06/13/18 0728  WBC 9.0  RBC 4.36  HGB 13.6  HCT 41.6  MCV 95.4  MCH 31.2  MCHC 32.7  RDW 13.4  PLT 228   Cardiac Enzymes Recent Labs  Lab 06/13/18 0728  TROPONINI <0.03   No results for input(s): TROPIPOC in the last 168 hours.  BNPNo results for input(s): BNP, PROBNP in the last 168 hours.  DDimer No results for input(s): DDIMER in the last 168 hours.  Radiology/Studies:  Dg Chest 2 View  Result Date: 06/13/2018 IMPRESSION: Aortic atherosclerosis. Coronary stents present. Slight bibasilar atelectasis. No edema or consolidation. Stable cardiac silhouette. Aortic Atherosclerosis (ICD10-I70.0). Electronically Signed   By: Lowella Grip III  M.D.   On: 06/13/2018 08:20    Assessment and Plan:   1. CAD involving the native coronary arteries with stable angina: -Currently, continues to note chest pain, however this is worse with coughing and reproducible to palpation -It may be beneficial treat her for noncardiac chest pain as well -Initial troponin is negative, continue to cycle to rule out -Increase Ranexa to 1000 mg bid -Monitor on telemetry -Soft BP continues to preclude long acting nitrate therapy  -Metoprolol held in the ED given hypotension  -Aggressive secondary prevention   2. HFrEF secondary to ICM: -She does not appear volume up at this time -Most recent echo from 03/2018 showed continued improvement in her LVSF with an EF of 45-50% -Her relative hypotension has precluded the ability to escalate evidence-based heart failure therapy -Soft BP in the ED as below leading to the holding of metoprolol at this time, resume at a lower dose when able -Not currently on ACRi/ARB/Entresto/sprionolactone given soft BP (EF no longer indicates for Entresto usage) -CHF education  3. HLD: -LDL of 60 from 02/2018 with normal AST/ALT at that time -Lipitor 80 mg daily  4. Poorly controlled DM: -Per IM  5. Hypotension: -This has been an ongoing issue for her and has precluded the ability to escalate evidence-based heart failure therapy. BP soft in the ED with systolic readings in  the 80s mmHg. She has been given IV fluids. Continue to hold metoprolol for now. Unable to add long acting nitrate given soft BP. Most recent BP in the ED in the low 443X systolic.   6. Vulvar cancer: -Status post recent biopsy    For questions or updates, please contact [place] Please consult [email or web address] for contact info under Cardiology/STEMI.   Signed, Christell Faith, PA-C [facility] Pager: [phone] 06/13/2018, 11:04 AM

## 2018-06-13 NOTE — Progress Notes (Signed)
Family Meeting Note  Advance Directive:yes  Today a meeting took place with the Patient.   The following clinical team members were present during this meeting:MD  The following were discussed:Patient's diagnosis: Chest pain, history of coronary artery disease status post MI in July treated with PCI status post 3 drug-eluting stent followed by stent thrombosis and restenosis, diabetes mellitus, COPD, hyperlipidemia, combined systolic and diastolic congestive heart failure, cardiomyopathy, treatment plan of care discussed in detail with the patient.  She verbalized understanding of the plan.   Patient's progosis: Unable to determine and Goals for treatment: Full Code  Daughter Carrington Clamp is a healthcare POA  Additional follow-up to be provided: Cardiology and hospitalist  Time spent during discussion:17 min  Nicholes Mango, MD

## 2018-06-13 NOTE — Telephone Encounter (Signed)
Dr. Theora Gianotti would like to have Renee Mack come back to clinic to discuss biopsy results and start Imiquimod. She is noted to currently be in the ED with chest pain. I will follow up with her 12/26.

## 2018-06-13 NOTE — H&P (Signed)
[facility] NAME: Renee Mack    MR#:  [id number]  DATE OF BIRTH:  05-14-1966  DATE OF ADMISSION:  06/13/2018  PRIMARY CARE PHYSICIAN: Olin Hauser, DO   REQUESTING/REFERRING PHYSICIAN:   CHIEF COMPLAINT:  Chest pain  HISTORY OF PRESENT ILLNESS:  Renee Mack  is a 52 y.o. female with a known history of coronary artery disease recent history of acute MI in July treated with PCI and 3 drug-eluting stents followed by stent thrombosis and restenosis requiring repetition of the procedure, combined diastolic and systolic congestive heart failure with ejection fraction 45 to 50%, ischemic cardiomyopathy, GERD, hyperlipidemia and diabetes mellitus presenting to the ED with a chief complaint of sharp crushing chest pain in the middle of the chest associated with shortness of breath.  Pain is 8 out of 10 during my examination troponin less than 0.03 EKG normal sinus rhythm.  Hospitalist team is called to admit the patient.  Patient is still complaining of 8 out of 10 chest pain during my examination.  PAST MEDICAL HISTORY:   Past Medical History:  Diagnosis Date  . Cancer ([place])    vulvular  . Cervical disc disease    a. 01/2018 MRI Cervical spine: Cervical spondylosis with multilevel disc and facet degeneration greatest at C5-6 and C6-7.  Moderate to sev R C5-6, mod L C5-6, and mod bilat C6-7 foraminal stenosis with multilevel mild foraminal stenosis.  Mild C5-6 and mod C6-7 canal stenosis. C6-7 central cord impingement w/ cord flattening.  . Chronic combined systolic (congestive) and diastolic (congestive) heart failure ([place])    a. 12/2017 Echo: EF 30-35%, mid-apicalanteroseptal, ant, and apical AK. Gr1 DD. Mild conc LVH; b. 03/2018 Echo: EF 45-50%, antsept, ant HK. Mild MR. Nl LA size. Nl RV fxn.  Marland Kitchen COPD (chronic obstructive pulmonary disease) (H[facility])    not on home oxygen  . Coronary artery disease    a. 12/2017 ACS/PCI:  LAD 100p (2.25x26 Resolute Onyx DES), 22m (2.0x12 Resolute Onyx DES), 80d (2.0x15 Resolute Onyx DES), RCA 90p (non-dominant). EF 25-35%. Post-MI course complicated by CGS; b. 10/5730 NSTEMI/subacute thrombosis-->LAD 100 (PTCA + DES x 1); c. 01/2018 NSTEMI/PCI: LM min irregs, LAD 50-60p ISR/hazy (3.5x12 Resolute DES), 14m/d (underexpansion of prior stents-->PTCA). EF 45-50%.  . Diabetes mellitus without complication ([place])   . GERD (gastroesophageal reflux disease)   . H/O degenerative disc disease   . Hypotension   . Ischemic cardiomyopathy    a. 12/2017 Echo: EF 30-35%; b. 03/2018 Echo: EF 45-50%.  . Myocardial infarction ([place])    a. 12/2017-->DES to LAD x 3.  . Pancreatitis   . Shingles   . Spinal stenosis   . Tobacco abuse   . Ulcer (traumatic) of oral mucosa   . Urinary incontinence     PAST SURGICAL HISTOIRY:   Past Surgical History:  Procedure Laterality Date  . ABDOMINAL HYSTERECTOMY     partial  . CORONARY BALLOON ANGIOPLASTY N/A 05/22/2018   Procedure: CORONARY BALLOON ANGIOPLASTY;  Surgeon: Wellington Hampshire, MD;  Location: [facility];  Service: Cardiovascular;  Laterality: N/A;  . CORONARY STENT INTERVENTION N/A 12/26/2017   Procedure: CORONARY STENT INTERVENTION;  Surgeon: Wellington Hampshire, MD;  Location: [facility];  Service: Cardiovascular;  Laterality: N/A;  . CORONARY STENT INTERVENTION N/A 02/08/2018   Procedure: CORONARY STENT INTERVENTION;  Surgeon: Nelva Bush, MD;  Location: [facility];  Service: Cardiovascular;  Laterality: N/A;  .  CORONARY/GRAFT ACUTE MI REVASCULARIZATION N/A 12/19/2017   Procedure: Coronary/Graft Acute MI Revascularization;  Surgeon: Wellington Hampshire, MD;  Location: [facility];  Service: Cardiovascular;  Laterality: N/A;  . ECTOPIC PREGNANCY SURGERY Left   . INTRAVASCULAR ULTRASOUND/IVUS N/A 02/08/2018   Procedure: Intravascular Ultrasound/IVUS;  Surgeon: Nelva Bush, MD;  Location: [facility];   Service: Cardiovascular;  Laterality: N/A;  . LEFT HEART CATH AND CORONARY ANGIOGRAPHY N/A 12/19/2017   Procedure: LEFT HEART CATH AND CORONARY ANGIOGRAPHY;  Surgeon: Wellington Hampshire, MD;  Location: [facility];  Service: Cardiovascular;  Laterality: N/A;  . LEFT HEART CATH AND CORONARY ANGIOGRAPHY N/A 12/26/2017   Procedure: LEFT HEART CATH AND CORONARY ANGIOGRAPHY;  Surgeon: Wellington Hampshire, MD;  Location: [facility];  Service: Cardiovascular;  Laterality: N/A;  . LEFT HEART CATH AND CORONARY ANGIOGRAPHY N/A 02/08/2018   Procedure: LEFT HEART CATH AND CORONARY ANGIOGRAPHY;  Surgeon: Nelva Bush, MD;  Location: [facility];  Service: Cardiovascular;  Laterality: N/A;  . LEFT HEART CATH AND CORONARY ANGIOGRAPHY N/A 05/22/2018   Procedure: LEFT HEART CATH AND CORONARY ANGIOGRAPHY;  Surgeon: Wellington Hampshire, MD;  Location: [facility];  Service: Cardiovascular;  Laterality: N/A;  . LYMPHADENECTOMY    . SPLENECTOMY, PARTIAL    . vulvulasectomy      SOCIAL HISTORY:   Social History   Tobacco Use  . Smoking status: Former Smoker    Packs/day: 0.50    Years: 30.00    Pack years: 15.00    Types: Cigarettes    Last attempt to quit: 12/19/2017    Years since quitting: 0.4  . Smokeless tobacco: Never Used  . Tobacco comment: quit 12/19/2017.  Substance Use Topics  . Alcohol use: No    FAMILY HISTORY:   Family History  Problem Relation Age of Onset  . Non-Hodgkin's lymphoma Mother   . Osteoporosis Mother   . Lupus Sister   . Heart disease Brother   . Heart attack Brother   . Heart attack Father     DRUG ALLERGIES:   Allergies  Allergen Reactions  . Bee Venom Itching, Shortness Of Breath and Swelling  . Metformin And Related Shortness Of Breath  . Darvon [Propoxyphene] Itching  . Gabapentin Swelling  . Nsaids Other (See Comments)    Ulcers   . Tramadol Hives  . Contrast Media [Iodinated Diagnostic Agents] Rash    If you benadryl, and  steroids she is able to take the contrast per pt    REVIEW OF SYSTEMS:  CONSTITUTIONAL: No fever, fatigue or weakness.  EYES: No blurred or double vision.  EARS, NOSE, AND THROAT: No tinnitus or ear pain.  RESPIRATORY: No cough, shortness of breath, wheezing or hemoptysis.  CARDIOVASCULAR: Reporting chest pain and shortness of breath ,, denies orthopnea, edema.  GASTROINTESTINAL: No nausea, vomiting, diarrhea or abdominal pain.  GENITOURINARY: No dysuria, hematuria.  ENDOCRINE: No polyuria, nocturia,  HEMATOLOGY: No anemia, easy bruising or bleeding SKIN: No rash or lesion. MUSCULOSKELETAL: No joint pain or arthritis.   NEUROLOGIC: No tingling, numbness, weakness.  PSYCHIATRY: No anxiety or depression.   MEDICATIONS AT HOME:   Prior to Admission medications   Medication Sig Start Date End Date Taking? Authorizing Provider  acetaminophen (TYLENOL) 325 MG tablet Take 650 mg by mouth every 6 (six) hours as needed for mild pain.    Yes [provider]  aspirin 81 MG tablet Take 81 mg by mouth daily.  Yes [provider]  atorvastatin (LIPITOR) 80 MG tablet Take 1 tablet (80 mg total) by mouth daily at 6 PM. 01/10/18 03/25/19 Yes Theora Gianotti, NP  citalopram (CELEXA) 20 MG tablet Take 1 tablet (20 mg total) by mouth daily. 03/29/18  Yes Karamalegos, Devonne Doughty, DO  esomeprazole (NEXIUM) 40 MG capsule Take 40 mg by mouth daily.    Yes [provider]  insulin aspart (NOVOLOG) 100 UNIT/ML injection Inject 25 Units into the skin 3 (three) times daily before meals. Take 10 units PLUS sliding scale Patient taking differently: Inject 35 Units into the skin 3 (three) times daily before meals. Take 10 units PLUS sliding scale  05/29/18 06/29/18 Yes Mody, Sital, MD  insulin glargine (LANTUS) 100 UNIT/ML injection Inject 1 mL (100 Units total) into the skin at bedtime. Patient taking differently: Inject 60 Units into the skin 2 (two) times daily.  05/29/18 06/29/18 Yes  Mody, Sital, MD  LORazepam (ATIVAN) 1 MG tablet Take 1 tablet (1 mg total) by mouth 2 (two) times daily as needed for anxiety. 05/31/18  Yes Karamalegos, Devonne Doughty, DO  metoprolol succinate (TOPROL-XL) 25 MG 24 hr tablet Take 1 tablet (25 mg total) by mouth daily. 06/08/18  Yes Dunn, Areta Haber, PA-C  promethazine (PHENERGAN) 25 MG tablet Take 25 mg by mouth every 6 (six) hours as needed for nausea. for nausea 05/15/18  Yes [provider]  ranolazine (RANEXA) 500 MG 12 hr tablet Take 1 tablet (500 mg total) by mouth 2 (two) times daily. 03/09/18  Yes Minna Merritts, MD  ticagrelor (BRILINTA) 90 MG TABS tablet Take 90 mg by mouth 2 (two) times daily.   Yes [provider]  albuterol (PROAIR HFA) 108 (90 Base) MCG/ACT inhaler Inhale 1 puff into the lungs every 6 (six) hours as needed for wheezing or shortness of breath.     [provider]  lidocaine (XYLOCAINE) 2 % jelly Apply 1 application topically as needed. 05/24/18   Verlon Au, NP  ondansetron (ZOFRAN) 4 MG tablet Take 1 tablet (4 mg total) by mouth daily as needed. Patient not taking: Reported on 06/13/2018 01/05/18   Orbie Pyo, MD  oxyCODONE (OXY IR/ROXICODONE) 5 MG immediate release tablet Take 1 tablet (5 mg total) by mouth every 6 (six) hours as needed for severe pain or breakthrough pain. Patient not taking: Reported on 06/13/2018 06/07/18   Verlon Au, NP  terconazole (TERAZOL 3) 0.8 % vaginal cream Apply intravaginally with applicator for 3 nights and topically to vulva then apply topically to vulva only. Patient not taking: Reported on 06/13/2018 05/24/18   Verlon Au, NP      VITAL SIGNS:  Blood pressure (!) 106/52, pulse 67, temperature 98.2 F (36.8 C), temperature source Oral, resp. rate 20, height 5\' 1"  (1.549 m), weight 62.2 kg, SpO2 97 %.  PHYSICAL EXAMINATION:  GENERAL:  52 y.o.-year-old patient lying in the bed with no acute distress.  EYES: Pupils equal, round,  reactive to light and accommodation. No scleral icterus. Extraocular muscles intact.  HEENT: Head atraumatic, normocephalic. Oropharynx and nasopharynx clear.  NECK:  Supple, no jugular venous distention. No thyroid enlargement, no tenderness.  LUNGS: Normal breath sounds bilaterally, no wheezing, rales,rhonchi or crepitation. No use of accessory muscles of respiration.  CARDIOVASCULAR: S1, S2 normal. No murmurs, rubs, or gallops.  Nonreproducible chest pain ABDOMEN: Soft, nontender, nondistended. Bowel sounds present. No organomegaly or mass.  EXTREMITIES: No pedal edema, cyanosis, or clubbing.  NEUROLOGIC:  Awake, alert and oriented x3 sensation intact. Gait not checked.  PSYCHIATRIC: The patient is alert and oriented x 3.  SKIN: No obvious rash, lesion, or ulcer.   LABORATORY PANEL:   CBC Recent Labs  Lab 06/13/18 0728  WBC 9.0  HGB 13.6  HCT 41.6  PLT 228   ------------------------------------------------------------------------------------------------------------------  Chemistries  Recent Labs  Lab 06/13/18 0728  NA 135  K 4.1  CL 104  CO2 23  GLUCOSE 372*  BUN 20  CREATININE 0.82  CALCIUM 8.8*   ------------------------------------------------------------------------------------------------------------------  Cardiac Enzymes Recent Labs  Lab 06/13/18 0728  TROPONINI <0.03   ------------------------------------------------------------------------------------------------------------------  RADIOLOGY:  Dg Chest 2 View  Result Date: 06/13/2018 CLINICAL DATA:  Chest pain and shortness of breath EXAM: CHEST - 2 VIEW COMPARISON:  May 28, 2018 FINDINGS: There is slight bibasilar atelectasis. No edema or consolidation. Heart size and pulmonary vascularity are normal. No adenopathy. There are coronary stents present. There is aortic atherosclerosis. No evident bone lesions. IMPRESSION: Aortic atherosclerosis. Coronary stents present. Slight bibasilar atelectasis. No  edema or consolidation. Stable cardiac silhouette. Aortic Atherosclerosis (ICD10-I70.0). Electronically Signed   By: Lowella Grip III M.D.   On: 06/13/2018 08:20    EKG:   Orders placed or performed during the hospital encounter of 06/13/18  . ED EKG within 10 minutes  . ED EKG within 10 minutes    IMPRESSION AND PLAN:    #Chest pain with history of acute MI, drug-eluting stent placement followed by stent thrombosis and restenosis requiring repeated procedure Admitted telemetry Cycle cardiac biomarkers Cardiology consult placed-[facility] already saw the patient Monitor patient on telemetry Ranexa dose increased to thousand milligrams twice a day Continue aspirin 81 mg, Lipitor, Toprol, Brilinta  #Chronic combined systolic and diastolic congestive heart failure not fluid overloaded at this time Aspirin, Lipitor, Toprol will be continued not on any diuretics  #Diabetes mellitus insulin requiring Diabetic diet, sliding scale insulin and Lantus 50 units every 12 hours (Patient takes 60 units every 12 hours) Diabetic coordinator consult placed  #Hyperlipidemia statin  #COPD no exacerbation provide breathing treatments as needed  DVT prophylaxis   All the records are reviewed and case discussed with ED provider. Management plans discussed with the patient, she is in agreement CODE STATUS: fc   TOTAL TIME TAKING CARE OF THIS PATIENT: 45  minutes.   Note: This dictation was prepared with Dragon dictation along with smaller phrase technology. Any transcriptional errors that result from this process are unintentional.  Nicholes Mango M.D on 06/13/2018 at 12:13 PM  Between 7am to 6pm - Pager - [phone]  After 6pm go to [email or web address] - password [email or web address] [facility]  Office  [phone]  CC: Primary care physician; Olin Hauser, DO

## 2018-06-13 NOTE — ED Triage Notes (Addendum)
Pt arrived via EMS for c/o CP that started last pm - pain radiates into left arm and left neck 9/10 pressure - ASA 324mg  given and NTG given by EMS

## 2018-06-13 NOTE — ED Provider Notes (Signed)
[facility] Emergency Department Provider Note  Time seen: 7:58 AM  I have reviewed the triage vital signs and the nursing notes.   HISTORY  Chief Complaint Chest Pain    HPI Renee Mack is a 52 y.o. female with a significant past medical history of CAD status post MI in July 2019, 3 stents placed, complicated by restenosis of a stent in August 2019, also diabetes, presents to the emergency department for chest pain.  Patient states she had mild chest discomfort last night when going to sleep however she was awoken from her sleep at 5:00 this morning with 9/10 chest pain mostly in the center to left side of her chest along with nausea, vomiting this morning, shortness of breath and diaphoresis per patient.  Patient denies any recent cough or congestion.  Denies any fever.  No abdominal pain or diarrhea.   Past Medical History:  Diagnosis Date  . Cancer ([place])    vulvular  . Cervical disc disease    a. 01/2018 MRI Cervical spine: Cervical spondylosis with multilevel disc and facet degeneration greatest at C5-6 and C6-7.  Moderate to sev R C5-6, mod L C5-6, and mod bilat C6-7 foraminal stenosis with multilevel mild foraminal stenosis.  Mild C5-6 and mod C6-7 canal stenosis. C6-7 central cord impingement w/ cord flattening.  . Chronic combined systolic (congestive) and diastolic (congestive) heart failure ([place])    a. 12/2017 Echo: EF 30-35%, mid-apicalanteroseptal, ant, and apical AK. Gr1 DD. Mild conc LVH; b. 03/2018 Echo: EF 45-50%, antsept, ant HK. Mild MR. Nl LA size. Nl RV fxn.  Marland Kitchen COPD (chronic obstructive pulmonary disease) (HCC)    not on home oxygen  . Coronary artery disease    a. 12/2017 ACS/PCI: LAD 100p (2.25x26 Resolute Onyx DES), 93m (2.0x12 Resolute Onyx DES), 80d (2.0x15 Resolute Onyx DES), RCA 90p (non-dominant). EF 25-35%. Post-MI course complicated by CGS; b. 02/9832 NSTEMI/subacute thrombosis-->LAD 100 (PTCA + DES x 1); c. 01/2018 NSTEMI/PCI: LM min  irregs, LAD 50-60p ISR/hazy (3.5x12 Resolute DES), 58m/d (underexpansion of prior stents-->PTCA). EF 45-50%.  . Diabetes mellitus without complication ([place])   . GERD (gastroesophageal reflux disease)   . H/O degenerative disc disease   . Hypotension   . Ischemic cardiomyopathy    a. 12/2017 Echo: EF 30-35%; b. 03/2018 Echo: EF 45-50%.  . Myocardial infarction ([place])    a. 12/2017-->DES to LAD x 3.  . Pancreatitis   . Shingles   . Spinal stenosis   . Tobacco abuse   . Ulcer (traumatic) of oral mucosa   . Urinary incontinence     Patient Active Problem List   Diagnosis Date Noted  . Hyperglycemia 05/28/2018  . Vulvar intraepithelial neoplasia (VIN) 05/24/2018  . History of ST elevation myocardial infarction (STEMI) 02/27/2018  . CAD (coronary artery disease) 02/27/2018  . Hyperlipidemia associated with type 2 diabetes mellitus ([place]) 02/27/2018  . Anxiety associated with depression 02/27/2018  . Chronic systolic heart failure ([place])   . Chest pain 12/26/2017  . Unstable angina ([place])   . Ischemic cardiomyopathy   . Former smoker   . Protein-calorie malnutrition, severe 07/18/2017  . Uncontrolled type 2 diabetes with neuropathy ([place]) 12/28/2014  . COPD (chronic obstructive pulmonary disease) ([place]) 12/28/2014  . GERD (gastroesophageal reflux disease) 12/28/2014  . History of shingles 12/28/2014  . Hyperlipidemia LDL goal <70 12/28/2014    Past Surgical History:  Procedure Laterality Date  . ABDOMINAL HYSTERECTOMY     partial  . CORONARY BALLOON ANGIOPLASTY N/A  05/22/2018   Procedure: CORONARY BALLOON ANGIOPLASTY;  Surgeon: Wellington Hampshire, MD;  Location: [place] [facility];  Service: Cardiovascular;  Laterality: N/A;  . CORONARY STENT INTERVENTION N/A 12/26/2017   Procedure: CORONARY STENT INTERVENTION;  Surgeon: Wellington Hampshire, MD;  Location: [place] [facility];  Service: Cardiovascular;  Laterality: N/A;  . CORONARY STENT INTERVENTION N/A 02/08/2018   Procedure: CORONARY  STENT INTERVENTION;  Surgeon: Nelva Bush, MD;  Location: [place];  Service: Cardiovascular;  Laterality: N/A;  . CORONARY/GRAFT ACUTE MI REVASCULARIZATION N/A 12/19/2017   Procedure: Coronary/Graft Acute MI Revascularization;  Surgeon: Wellington Hampshire, MD;  Location: [place] [facility];  Service: Cardiovascular;  Laterality: N/A;  . ECTOPIC PREGNANCY SURGERY Left   . INTRAVASCULAR ULTRASOUND/IVUS N/A 02/08/2018   Procedure: Intravascular Ultrasound/IVUS;  Surgeon: Nelva Bush, MD;  Location: [place] [facility];  Service: Cardiovascular;  Laterality: N/A;  . LEFT HEART CATH AND CORONARY ANGIOGRAPHY N/A 12/19/2017   Procedure: LEFT HEART CATH AND CORONARY ANGIOGRAPHY;  Surgeon: Wellington Hampshire, MD;  Location: [facility];  Service: Cardiovascular;  Laterality: N/A;  . LEFT HEART CATH AND CORONARY ANGIOGRAPHY N/A 12/26/2017   Procedure: LEFT HEART CATH AND CORONARY ANGIOGRAPHY;  Surgeon: Wellington Hampshire, MD;  Location: [facility];  Service: Cardiovascular;  Laterality: N/A;  . LEFT HEART CATH AND CORONARY ANGIOGRAPHY N/A 02/08/2018   Procedure: LEFT HEART CATH AND CORONARY ANGIOGRAPHY;  Surgeon: Nelva Bush, MD;  Location: [facility];  Service: Cardiovascular;  Laterality: N/A;  . LEFT HEART CATH AND CORONARY ANGIOGRAPHY N/A 05/22/2018   Procedure: LEFT HEART CATH AND CORONARY ANGIOGRAPHY;  Surgeon: Wellington Hampshire, MD;  Location: [facility];  Service: Cardiovascular;  Laterality: N/A;  . LYMPHADENECTOMY    . SPLENECTOMY, PARTIAL    . vulvulasectomy      Prior to Admission medications   Medication Sig Start Date End Date Taking? Authorizing Provider  acetaminophen (TYLENOL) 325 MG tablet Take 650 mg by mouth every 6 (six) hours as needed for mild pain.     [provider]  albuterol (PROAIR HFA) 108 (90 Base) MCG/ACT inhaler Inhale 1 puff into the lungs every 6 (six) hours as needed for wheezing or shortness of breath.      [provider]  aspirin 81 MG tablet Take 81 mg by mouth daily.    [provider]  atorvastatin (LIPITOR) 80 MG tablet Take 1 tablet (80 mg total) by mouth daily at 6 PM. 01/10/18 03/25/19  Theora Gianotti, NP  citalopram (CELEXA) 20 MG tablet Take 1 tablet (20 mg total) by mouth daily. 03/29/18   Karamalegos, Devonne Doughty, DO  esomeprazole (NEXIUM) 40 MG capsule Take 40 mg by mouth daily.     [provider]  insulin aspart (NOVOLOG) 100 UNIT/ML injection Inject 25 Units into the skin 3 (three) times daily before meals. Take 10 units PLUS sliding scale 05/29/18 06/29/18  Bettey Costa, MD  insulin glargine (LANTUS) 100 UNIT/ML injection Inject 1 mL (100 Units total) into the skin at bedtime. 05/29/18 06/29/18  Bettey Costa, MD  lidocaine (XYLOCAINE) 2 % jelly Apply 1 application topically as needed. 05/24/18   Verlon Au, NP  LORazepam (ATIVAN) 1 MG tablet Take 1 tablet (1 mg total) by mouth 2 (two) times daily as needed for anxiety. 05/31/18   Karamalegos, Devonne Doughty, DO  metoprolol succinate (TOPROL-XL) 25 MG 24 hr tablet Take 1 tablet (25 mg total) by  mouth daily. 06/08/18   Dunn, Areta Haber, PA-C  ondansetron (ZOFRAN) 4 MG tablet Take 1 tablet (4 mg total) by mouth daily as needed. 01/05/18   Schaevitz, Randall An, MD  oxyCODONE (OXY IR/ROXICODONE) 5 MG immediate release tablet Take 1 tablet (5 mg total) by mouth every 6 (six) hours as needed for severe pain or breakthrough pain. 06/07/18   Verlon Au, NP  promethazine (PHENERGAN) 25 MG tablet Take 25 mg by mouth every 6 (six) hours as needed for nausea. for nausea 05/15/18   [provider]  ranolazine (RANEXA) 500 MG 12 hr tablet Take 1 tablet (500 mg total) by mouth 2 (two) times daily. 03/09/18   Minna Merritts, MD  terconazole (TERAZOL 3) 0.8 % vaginal cream Apply intravaginally with applicator for 3 nights and topically to vulva then apply topically to vulva only. 05/24/18   Verlon Au, NP   ticagrelor (BRILINTA) 90 MG TABS tablet Take 90 mg by mouth 2 (two) times daily.    [provider]    Allergies  Allergen Reactions  . Bee Venom Itching, Shortness Of Breath and Swelling  . Metformin And Related Shortness Of Breath  . Darvon [Propoxyphene] Itching  . Gabapentin Swelling  . Nsaids Other (See Comments)    Ulcers   . Tramadol Hives  . Contrast Media [Iodinated Diagnostic Agents] Rash    If you benadryl, and steroids she is able to take the contrast per pt    Family History  Problem Relation Age of Onset  . Non-Hodgkin's lymphoma Mother   . Osteoporosis Mother   . Lupus Sister   . Heart disease Brother   . Heart attack Brother   . Heart attack Father     Social History Social History   Tobacco Use  . Smoking status: Former Smoker    Packs/day: 0.50    Years: 30.00    Pack years: 15.00    Types: Cigarettes    Last attempt to quit: 12/19/2017    Years since quitting: 0.4  . Smokeless tobacco: Never Used  . Tobacco comment: quit 12/19/2017.  Substance Use Topics  . Alcohol use: No  . Drug use: No    Review of Systems Constitutional: Negative for fever. Cardiovascular: 9/10 chest pain. Respiratory: Positive for shortness of breath Gastrointestinal: Negative for abdominal pain.  Positive for vomiting. Musculoskeletal: Negative for leg pain or swelling Skin: Negative for skin complaints  Neurological: Negative for headache All other ROS negative  ____________________________________________   PHYSICAL EXAM:  VITAL SIGNS: ED Triage Vitals  Enc Vitals Group     BP 06/13/18 0716 113/60     Pulse Rate 06/13/18 0716 74     Resp 06/13/18 0716 13     Temp 06/13/18 0716 98.4 F (36.9 C)     Temp Source 06/13/18 0716 Oral     SpO2 06/13/18 0716 95 %     Weight 06/13/18 0714 135 lb (61.2 kg)     Height 06/13/18 0714 5\' 1"  (1.549 m)     Head Circumference --      Peak Flow --      Pain Score 06/13/18 0713 9     Pain Loc --      Pain Edu?  --      Excl. in [place]? --     Constitutional: Alert and oriented. Well appearing and in no distress. Eyes: Normal exam ENT   Head: Normocephalic and atraumatic.   Mouth/Throat: Mucous membranes are moist. Cardiovascular:  Normal rate, regular rhythm.  Respiratory: Normal respiratory effort without tachypnea nor retractions. Breath sounds are clear.  Moderate chest tenderness to palpation. Gastrointestinal: Soft and nontender. No distention. Musculoskeletal: Nontender with normal range of motion in all extremities. No lower extremity tenderness or edema. Neurologic:  Normal speech and language. No gross focal neurologic deficits  Skin:  Skin is warm, dry and intact.  Psychiatric: Mood and affect are normal.   ____________________________________________    EKG  EKG viewed and interpreted by myself shows a normal sinus rhythm at 74 bpm with a narrow QRS, normal axis, normal intervals, nonspecific ST changes.  Grossly unchanged from prior EKG.  ____________________________________________    RADIOLOGY  Chest x-ray negative  ____________________________________________   INITIAL IMPRESSION / ASSESSMENT AND PLAN / ED COURSE  Pertinent labs & imaging results that were available during my care of the patient were reviewed by me and considered in my medical decision making (see chart for details).  Patient presents to the emergency department for chest pain nausea vomiting diaphoresis and shortness of breath.  Patient is a very high risk chest pain patient having had 3 stents previously as well as a stent revision/replacement in August.  We will check labs, chest x-ray and continue to closely monitor.  Patient took nitroglycerin as well as 4 baby aspirin at home prior to arrival.  We will dose morphine for pain control.  Reassuringly patient's labs are largely within normal limits including a negative troponin.  Chest x-ray shows no acute findings.  EKG is unchanged from prior.   Patient's pain is improved with pain medication although continues to have chest pain.  Given the patient's high risk status will admit to the hospital service for continued monitoring.  ____________________________________________   FINAL CLINICAL IMPRESSION(S) / ED DIAGNOSES  chest pain   Harvest Dark, MD 06/13/18 [phone]

## 2018-06-14 ENCOUNTER — Encounter: Payer: Self-pay | Admitting: Radiology

## 2018-06-14 ENCOUNTER — Inpatient Hospital Stay: Payer: Medicaid Other

## 2018-06-14 DIAGNOSIS — K219 Gastro-esophageal reflux disease without esophagitis: Secondary | ICD-10-CM | POA: Diagnosis present

## 2018-06-14 DIAGNOSIS — Z8589 Personal history of malignant neoplasm of other organs and systems: Secondary | ICD-10-CM | POA: Diagnosis not present

## 2018-06-14 DIAGNOSIS — Z7902 Long term (current) use of antithrombotics/antiplatelets: Secondary | ICD-10-CM | POA: Diagnosis not present

## 2018-06-14 DIAGNOSIS — I5042 Chronic combined systolic (congestive) and diastolic (congestive) heart failure: Secondary | ICD-10-CM | POA: Diagnosis present

## 2018-06-14 DIAGNOSIS — I25110 Atherosclerotic heart disease of native coronary artery with unstable angina pectoris: Secondary | ICD-10-CM | POA: Diagnosis present

## 2018-06-14 DIAGNOSIS — Z8262 Family history of osteoporosis: Secondary | ICD-10-CM | POA: Diagnosis not present

## 2018-06-14 DIAGNOSIS — I255 Ischemic cardiomyopathy: Secondary | ICD-10-CM | POA: Diagnosis present

## 2018-06-14 DIAGNOSIS — Z87891 Personal history of nicotine dependence: Secondary | ICD-10-CM | POA: Diagnosis not present

## 2018-06-14 DIAGNOSIS — E785 Hyperlipidemia, unspecified: Secondary | ICD-10-CM | POA: Diagnosis present

## 2018-06-14 DIAGNOSIS — J069 Acute upper respiratory infection, unspecified: Secondary | ICD-10-CM | POA: Diagnosis present

## 2018-06-14 DIAGNOSIS — Z885 Allergy status to narcotic agent status: Secondary | ICD-10-CM | POA: Diagnosis not present

## 2018-06-14 DIAGNOSIS — Z91030 Bee allergy status: Secondary | ICD-10-CM | POA: Diagnosis not present

## 2018-06-14 DIAGNOSIS — Z9081 Acquired absence of spleen: Secondary | ICD-10-CM | POA: Diagnosis not present

## 2018-06-14 DIAGNOSIS — I252 Old myocardial infarction: Secondary | ICD-10-CM | POA: Diagnosis not present

## 2018-06-14 DIAGNOSIS — M4802 Spinal stenosis, cervical region: Secondary | ICD-10-CM | POA: Diagnosis present

## 2018-06-14 DIAGNOSIS — Z90711 Acquired absence of uterus with remaining cervical stump: Secondary | ICD-10-CM | POA: Diagnosis not present

## 2018-06-14 DIAGNOSIS — Z8249 Family history of ischemic heart disease and other diseases of the circulatory system: Secondary | ICD-10-CM | POA: Diagnosis not present

## 2018-06-14 DIAGNOSIS — Z888 Allergy status to other drugs, medicaments and biological substances status: Secondary | ICD-10-CM | POA: Diagnosis not present

## 2018-06-14 DIAGNOSIS — Z955 Presence of coronary angioplasty implant and graft: Secondary | ICD-10-CM | POA: Diagnosis not present

## 2018-06-14 DIAGNOSIS — Z832 Family history of diseases of the blood and blood-forming organs and certain disorders involving the immune mechanism: Secondary | ICD-10-CM | POA: Diagnosis not present

## 2018-06-14 DIAGNOSIS — J9811 Atelectasis: Secondary | ICD-10-CM | POA: Diagnosis present

## 2018-06-14 DIAGNOSIS — R0789 Other chest pain: Secondary | ICD-10-CM

## 2018-06-14 DIAGNOSIS — Z91041 Radiographic dye allergy status: Secondary | ICD-10-CM | POA: Diagnosis not present

## 2018-06-14 DIAGNOSIS — Z807 Family history of other malignant neoplasms of lymphoid, hematopoietic and related tissues: Secondary | ICD-10-CM | POA: Diagnosis not present

## 2018-06-14 DIAGNOSIS — J449 Chronic obstructive pulmonary disease, unspecified: Secondary | ICD-10-CM | POA: Diagnosis present

## 2018-06-14 LAB — GLUCOSE, CAPILLARY
Glucose-Capillary: 110 mg/dL — ABNORMAL HIGH (ref 70–99)
Glucose-Capillary: 152 mg/dL — ABNORMAL HIGH (ref 70–99)
Glucose-Capillary: 174 mg/dL — ABNORMAL HIGH (ref 70–99)
Glucose-Capillary: 217 mg/dL — ABNORMAL HIGH (ref 70–99)

## 2018-06-14 LAB — HIV ANTIBODY (ROUTINE TESTING W REFLEX): HIV Screen 4th Generation wRfx: NONREACTIVE

## 2018-06-14 MED ORDER — IOPAMIDOL (ISOVUE-370) INJECTION 76%
75.0000 mL | Freq: Once | INTRAVENOUS | Status: AC | PRN
  Administered 2018-06-14: 75 mL via INTRAVENOUS

## 2018-06-14 MED ORDER — PREDNISONE 50 MG PO TABS
50.0000 mg | ORAL_TABLET | Freq: Once | ORAL | Status: AC
  Administered 2018-06-14: 50 mg via ORAL
  Filled 2018-06-14: qty 1

## 2018-06-14 MED ORDER — PREDNISONE 50 MG PO TABS
50.0000 mg | ORAL_TABLET | Freq: Every day | ORAL | Status: DC
  Administered 2018-06-14: 50 mg via ORAL
  Filled 2018-06-14: qty 1

## 2018-06-14 MED ORDER — CYCLOBENZAPRINE HCL 10 MG PO TABS
5.0000 mg | ORAL_TABLET | Freq: Three times a day (TID) | ORAL | Status: DC | PRN
  Administered 2018-06-14 – 2018-06-15 (×4): 5 mg via ORAL
  Filled 2018-06-14 (×4): qty 1

## 2018-06-14 MED ORDER — DIPHENHYDRAMINE HCL 50 MG/ML IJ SOLN
50.0000 mg | Freq: Once | INTRAMUSCULAR | Status: AC
  Administered 2018-06-14: 50 mg via INTRAVENOUS
  Filled 2018-06-14: qty 1

## 2018-06-14 MED ORDER — SODIUM CHLORIDE 0.9 % IV BOLUS
500.0000 mL | Freq: Once | INTRAVENOUS | Status: AC
  Administered 2018-06-14: 500 mL via INTRAVENOUS

## 2018-06-14 NOTE — Plan of Care (Signed)
Patient has complained of chest pain, not relieved by pain medication. Declines to take nitroglycerin. Patient's systolic blood pressure in the 90's. Physician is aware.

## 2018-06-14 NOTE — Progress Notes (Signed)
Progress Note  Patient Name: Renee Mack Date of Encounter: 06/14/2018  Primary Cardiologist: [name], MD   Subjective   She continues to complain of constant chest pain which is worse with taking a deep breath.  The pain is sharp with a component of tightness feeling.  Inpatient Medications    Scheduled Meds: . aspirin EC  81 mg Oral Daily  . atorvastatin  80 mg Oral q1800  . citalopram  20 mg Oral Daily  . diphenhydrAMINE  50 mg Intravenous Once  . enoxaparin (LOVENOX) injection  40 mg Subcutaneous Q24H  . insulin aspart  0-5 Units Subcutaneous QHS  . insulin aspart  0-9 Units Subcutaneous TID WC  . insulin aspart  25 Units Subcutaneous TID AC  . insulin glargine  50 Units Subcutaneous BID  . pantoprazole  40 mg Oral Daily  . predniSONE  50 mg Oral Q breakfast  . ranolazine  1,000 mg Oral BID  . sodium chloride flush  10 mL Intravenous Q12H  . ticagrelor  90 mg Oral BID   Continuous Infusions:  PRN Meds: acetaminophen, albuterol, cyclobenzaprine, HYDROcodone-acetaminophen, LORazepam, morphine injection, nitroGLYCERIN, ondansetron (ZOFRAN) IV, ondansetron, promethazine   Vital Signs    Vitals:   06/14/18 0405 06/14/18 0733 06/14/18 0941 06/14/18 1120  BP:  109/62 129/66 119/69  Pulse:  66 70 62  Resp:  18 18   Temp:  (!) 97.5 F (36.4 C)    TempSrc:  Oral    SpO2:  98% 99% 98%  Weight: 63 kg     Height:        Intake/Output Summary (Last 24 hours) at 06/14/2018 1302 Last data filed at 06/14/2018 0412 Gross per 24 hour  Intake 505.53 ml  Output 1100 ml  Net -594.47 ml   Filed Weights   06/13/18 0714 06/13/18 1157 06/14/18 0405  Weight: 61.2 kg 62.2 kg 63 kg    Telemetry    Normal sinus rhythm- Personally Reviewed  ECG    Normal sinus rhythm with poor R wave progression in the anterior leads.  No significant ST changes- Personally Reviewed  Physical Exam   GEN: No acute distress.   Neck: No JVD Cardiac: RRR, no murmurs, rubs, or  gallops.  Respiratory: Clear to auscultation bilaterally. GI: Soft, nontender, non-distended  MS: No edema; No deformity. Neuro:  Nonfocal  Psych: Normal affect   Labs    Chemistry Recent Labs  Lab 06/13/18 0728  NA 135  K 4.1  CL 104  CO2 23  GLUCOSE 372*  BUN 20  CREATININE 0.82  CALCIUM 8.8*  GFRNONAA >60  GFRAA >60  ANIONGAP 8     Hematology Recent Labs  Lab 06/13/18 0728  WBC 9.0  RBC 4.36  HGB 13.6  HCT 41.6  MCV 95.4  MCH 31.2  MCHC 32.7  RDW 13.4  PLT 228    Cardiac Enzymes Recent Labs  Lab 06/13/18 0728 06/13/18 1225 06/13/18 1349 06/13/18 1643  TROPONINI <0.03 <0.03 <0.03 <0.03   No results for input(s): TROPIPOC in the last 168 hours.   BNPNo results for input(s): BNP, PROBNP in the last 168 hours.   DDimer No results for input(s): DDIMER in the last 168 hours.   Radiology    Dg Chest 2 View  Result Date: 06/13/2018 CLINICAL DATA:  Chest pain and shortness of breath EXAM: CHEST - 2 VIEW COMPARISON:  May 28, 2018 FINDINGS: There is slight bibasilar atelectasis. No edema or consolidation. Heart size and pulmonary vascularity are  normal. No adenopathy. There are coronary stents present. There is aortic atherosclerosis. No evident bone lesions. IMPRESSION: Aortic atherosclerosis. Coronary stents present. Slight bibasilar atelectasis. No edema or consolidation. Stable cardiac silhouette. Aortic Atherosclerosis (ICD10-I70.0). Electronically Signed   By: Lowella Grip III M.D.   On: 06/13/2018 08:20    Cardiac Studies     Patient Profile     52 y.o. female with a hx of CAD s/pMI and drug-eluting stent placement to LAD x3 in7/2019,with subsequent readmission for subacute stent thrombosis and repeat PCI x3 s/p PTCA most recently in 76/5465, combined systolic and diastolic congestive heart failure, ischemic cardiomyopathy with EF of 45 to 50%, former tobacco abuse, COPD, hyperlipidemia, GERD,poorly controlledtype 2 diabetes mellitus,  and pancreatitis who presented with atypical chest pain  Assessment & Plan    1.  Atypical chest pain: The the pain seems to be pleuritic in nature.  She ruled out for myocardial infarction and EKG showed no acute changes. I agree with CTA of the chest to rule out pulmonary embolism or other etiology for her pain. If CTA is unremarkable and the patient continues to have significant chest pain, we might have to repeat her cardiac catheterization.  2.  Coronary artery disease involving native coronary arteries: Continue aspirin and Brilinta.  We increased Ranexa to thousand milligrams twice daily.  This is overall a difficult situation given the recurrent in-stent restenosis in the LAD.  Unfortunately, the stents extend all the way distally and thus she does not have a good target for CABG even if she has current restenosis.  3.  Hyperlipidemia: Continue high-dose atorvastatin.  4.  Mild ischemic cardiomyopathy: She has not been able to tolerate small dose beta-blocker or ACE inhibitor due to low blood pressure.     For questions or updates, please contact [place] Please consult [email or web address] for contact info under        Signed, [name], MD  06/14/2018, 1:02 PM

## 2018-06-14 NOTE — Progress Notes (Signed)
Patient was awaken for chest pain. Patient stated she was sweaty. Checked blood glucose, POCT was 128. Patient is afebrile. Patient's systolic blood pressure is 89. Received orders for flexeril and 500cc bolus X1.

## 2018-06-14 NOTE — Progress Notes (Signed)
Pt continues to complain of left sided chest pain, 8/10 , medicated with hydrocodone once and morphine PRN, blood pressure stable 119/63 pulse 62. Dr. Fletcher Anon on the floor and made aware. Will continue to monitor

## 2018-06-14 NOTE — Progress Notes (Signed)
[facility] [place] NAME: Renee Mack    MR#:  [id number]  DATE OF BIRTH:  [birth year]  SUBJECTIVE:  CHIEF COMPLAINT:  Pt still with chest pain, reports pain is worse with deep breaths  REVIEW OF SYSTEMS:  CONSTITUTIONAL: No fever, fatigue or weakness.  EYES: No blurred or double vision.  EARS, NOSE, AND THROAT: No tinnitus or ear pain.  RESPIRATORY: No cough, shortness of breath, wheezing or hemoptysis.  CARDIOVASCULAR: has pleuriticchest pain, no orthopnea, edema.  GASTROINTESTINAL: No nausea, vomiting, diarrhea or abdominal pain.  GENITOURINARY: No dysuria, hematuria.  ENDOCRINE: No polyuria, nocturia,  HEMATOLOGY: No anemia, easy bruising or bleeding SKIN: No rash or lesion. MUSCULOSKELETAL: No joint pain or arthritis.   NEUROLOGIC: No tingling, numbness, weakness.  PSYCHIATRY: No anxiety or depression.   DRUG ALLERGIES:   Allergies  Allergen Reactions  . Bee Venom Itching, Shortness Of Breath and Swelling  . Metformin And Related Shortness Of Breath  . Darvon [Propoxyphene] Itching  . Gabapentin Swelling  . Nsaids Other (See Comments)    Ulcers   . Tramadol Hives  . Contrast Media [Iodinated Diagnostic Agents] Rash    If you benadryl, and steroids she is able to take the contrast per pt    VITALS:  Blood pressure (!) 105/58, pulse 63, temperature 98.1 F (36.7 C), temperature source Oral, resp. rate 18, height 5\' 1"  (1.549 m), weight 63 kg, SpO2 93 %.  PHYSICAL EXAMINATION:  GENERAL:  52 y.o.-year-old patient lying in the bed with no acute distress.  EYES: Pupils equal, round, reactive to light and accommodation. No scleral icterus. Extraocular muscles intact.  HEENT: Head atraumatic, normocephalic. Oropharynx and nasopharynx clear.  NECK:  Supple, no jugular venous distention. No thyroid enlargement, no tenderness.  LUNGS: Normal breath sounds bilaterally, no wheezing, rales,rhonchi or crepitation. No use of  accessory muscles of respiration.  CARDIOVASCULAR: S1, S2 normal. No murmurs, rubs, or gallops.  ABDOMEN: Soft, nontender, nondistended. Bowel sounds present.  EXTREMITIES: No pedal edema, cyanosis, or clubbing.  NEUROLOGIC: awake and alert . Sensation intact. Gait not checked.  PSYCHIATRIC: The patient is alert and oriented x 3.  SKIN: No obvious rash, lesion, or ulcer.    LABORATORY PANEL:   CBC Recent Labs  Lab 06/13/18 0728  WBC 9.0  HGB 13.6  HCT 41.6  PLT 228   ------------------------------------------------------------------------------------------------------------------  Chemistries  Recent Labs  Lab 06/13/18 0728  NA 135  K 4.1  CL 104  CO2 23  GLUCOSE 372*  BUN 20  CREATININE 0.82  CALCIUM 8.8*   ------------------------------------------------------------------------------------------------------------------  Cardiac Enzymes Recent Labs  Lab 06/13/18 1643  TROPONINI <0.03   ------------------------------------------------------------------------------------------------------------------  RADIOLOGY:  Dg Chest 2 View  Result Date: 06/13/2018 CLINICAL DATA:  Chest pain and shortness of breath EXAM: CHEST - 2 VIEW COMPARISON:  May 28, 2018 FINDINGS: There is slight bibasilar atelectasis. No edema or consolidation. Heart size and pulmonary vascularity are normal. No adenopathy. There are coronary stents present. There is aortic atherosclerosis. No evident bone lesions. IMPRESSION: Aortic atherosclerosis. Coronary stents present. Slight bibasilar atelectasis. No edema or consolidation. Stable cardiac silhouette. Aortic Atherosclerosis (ICD10-I70.0). Electronically Signed   By: Lowella Grip III M.D.   On: 06/13/2018 08:20    EKG:   Orders placed or performed during the hospital encounter of 06/13/18  . ED EKG within 10 minutes  . ED EKG within 10 minutes    ASSESSMENT AND PLAN:   Chest pain with history of  acute MI, drug-eluting stent  placement followed by stent thrombosis and restenosis requiring repeated procedure AMI ruled out neg troponins CTA chest to r/o PE  Cardiology consult placed-[facility] is following , if CTA chest neg ,might consider cardiac cath Monitor patient on telemetry Ranexa dose increased to thousand milligrams twice a day Continue aspirin 81 mg, Lipitor, Toprol, Brilinta  #Chronic combined systolic and diastolic congestive heart failure not fluid overloaded at this time Aspirin, Lipitor, Toprol will be continued not on any diuretics  #Diabetes mellitus insulin requiring Diabetic diet, sliding scale insulin and Lantus 50 units every 12 hours (Patient takes 60 units every 12 hours) Diabetic coordinator consult placed  #Hyperlipidemia statin  #COPD no exacerbation provide breathing treatments as needed  DVT prophylaxis     All the records are reviewed and case discussed with Care Management/Social Workerr. Management plans discussed with the patient, family and they are in agreement.  CODE STATUS: fc   TOTAL TIME TAKING CARE OF THIS PATIENT: 36  minutes.   POSSIBLE D/C IN 1-2  DAYS, DEPENDING ON CLINICAL CONDITION.  Note: This dictation was prepared with Dragon dictation along with smaller phrase technology. Any transcriptional errors that result from this process are unintentional.   Nicholes Mango M.D on 06/14/2018 at 9:29 PM  Between 7am to 6pm - Pager - [phone] After 6pm go to [email or web address] - password [email or web address] [facility]  Office  [phone]  CC: Primary care physician; Olin Hauser, DO

## 2018-06-14 NOTE — Plan of Care (Signed)
  Problem: Education: Goal: Knowledge of General Education information will improve Description Including pain rating scale, medication(s)/side effects and non-pharmacologic comfort measures Outcome: Progressing   Problem: Clinical Measurements: Goal: Cardiovascular complication will be avoided Outcome: Progressing   Problem: Education: Goal: Understanding of cardiac disease, CV risk reduction, and recovery process will improve Outcome: Progressing

## 2018-06-15 ENCOUNTER — Telehealth: Payer: Self-pay

## 2018-06-15 ENCOUNTER — Encounter
Admission: EM | Disposition: A | Discharge status: Home / Self Care | Payer: Self-pay | Source: Home / Self Care | Attending: Internal Medicine

## 2018-06-15 DIAGNOSIS — I25110 Atherosclerotic heart disease of native coronary artery with unstable angina pectoris: Principal | ICD-10-CM

## 2018-06-15 HISTORY — PX: LEFT HEART CATH AND CORONARY ANGIOGRAPHY: CATH118249

## 2018-06-15 LAB — CBC
HCT: 42.7 % (ref 36.0–46.0)
Hemoglobin: 14.3 g/dL (ref 12.0–15.0)
MCH: 31.2 pg (ref 26.0–34.0)
MCHC: 33.5 g/dL (ref 30.0–36.0)
MCV: 93.2 fL (ref 80.0–100.0)
NRBC: 0 % (ref 0.0–0.2)
Platelets: 259 10*3/uL (ref 150–400)
RBC: 4.58 MIL/uL (ref 3.87–5.11)
RDW: 13.8 % (ref 11.5–15.5)
WBC: 15.5 10*3/uL — AB (ref 4.0–10.5)

## 2018-06-15 LAB — PROTIME-INR
INR: 1.03
Prothrombin Time: 13.4 seconds (ref 11.4–15.2)

## 2018-06-15 LAB — GLUCOSE, CAPILLARY
GLUCOSE-CAPILLARY: 162 mg/dL — AB (ref 70–99)
Glucose-Capillary: 267 mg/dL — ABNORMAL HIGH (ref 70–99)
Glucose-Capillary: 107 mg/dL — ABNORMAL HIGH (ref 70–99)
Glucose-Capillary: 271 mg/dL — ABNORMAL HIGH (ref 70–99)
Glucose-Capillary: 248 mg/dL — ABNORMAL HIGH (ref 70–99)

## 2018-06-15 LAB — APTT: APTT: 31 s (ref 24–36)

## 2018-06-15 LAB — HEPARIN LEVEL (UNFRACTIONATED): Heparin Unfractionated: 0.44 IU/mL (ref 0.30–0.70)

## 2018-06-15 LAB — INFLUENZA PANEL BY PCR (TYPE A & B)
Influenza A By PCR: NEGATIVE
Influenza B By PCR: NEGATIVE

## 2018-06-15 SURGERY — LEFT HEART CATH AND CORONARY ANGIOGRAPHY
Anesthesia: Moderate Sedation

## 2018-06-15 MED ORDER — SODIUM CHLORIDE 0.9 % WEIGHT BASED INFUSION
3.0000 mL/kg/h | INTRAVENOUS | Status: DC
  Administered 2018-06-15: 3 mL/kg/h via INTRAVENOUS

## 2018-06-15 MED ORDER — SODIUM CHLORIDE FLUSH 0.9 % IV SOLN
INTRAVENOUS | Status: AC
  Filled 2018-06-15: qty 10

## 2018-06-15 MED ORDER — HEPARIN (PORCINE) 25000 UT/250ML-% IV SOLN
1050.0000 [IU]/h | INTRAVENOUS | Status: DC
  Administered 2018-06-15: 1050 [IU]/h via INTRAVENOUS
  Filled 2018-06-15: qty 250

## 2018-06-15 MED ORDER — DIPHENHYDRAMINE HCL 50 MG/ML IJ SOLN
50.0000 mg | Freq: Once | INTRAMUSCULAR | Status: AC
  Administered 2018-06-15: 50 mg via INTRAVENOUS

## 2018-06-15 MED ORDER — SODIUM CHLORIDE 0.9 % IV SOLN: 250.0000 mL | INTRAVENOUS | Status: DC | PRN

## 2018-06-15 MED ORDER — SODIUM CHLORIDE 0.9% FLUSH: 3.0000 mL | INTRAVENOUS | Status: DC | PRN

## 2018-06-15 MED ORDER — MIDAZOLAM HCL 2 MG/2ML IJ SOLN
INTRAMUSCULAR | Status: AC
  Filled 2018-06-15: qty 2

## 2018-06-15 MED ORDER — FENTANYL CITRATE (PF) 100 MCG/2ML IJ SOLN
INTRAMUSCULAR | Status: AC
  Filled 2018-06-15: qty 2

## 2018-06-15 MED ORDER — HYDROCODONE-ACETAMINOPHEN 5-325 MG PO TABS
ORAL_TABLET | ORAL | Status: AC
  Filled 2018-06-15: qty 2

## 2018-06-15 MED ORDER — SODIUM CHLORIDE 0.9 % WEIGHT BASED INFUSION: 1.0000 mL/kg/h | INTRAVENOUS | Status: DC

## 2018-06-15 MED ORDER — METHYLPREDNISOLONE SODIUM SUCC 125 MG IJ SOLR
125.0000 mg | Freq: Once | INTRAMUSCULAR | Status: AC
  Administered 2018-06-15: 125 mg via INTRAVENOUS

## 2018-06-15 MED ORDER — METHYLPREDNISOLONE SODIUM SUCC 125 MG IJ SOLR
INTRAMUSCULAR | Status: AC
  Filled 2018-06-15: qty 2

## 2018-06-15 MED ORDER — HEPARIN BOLUS VIA INFUSION
2000.0000 [IU] | Freq: Once | INTRAVENOUS | Status: AC
  Administered 2018-06-15: 2000 [IU] via INTRAVENOUS
  Filled 2018-06-15: qty 2000

## 2018-06-15 MED ORDER — SODIUM CHLORIDE 0.9 % IV SOLN
INTRAVENOUS | Status: AC
  Administered 2018-06-15: 18:00:00 via INTRAVENOUS

## 2018-06-15 MED ORDER — HEPARIN SODIUM (PORCINE) 1000 UNIT/ML IJ SOLN
INTRAMUSCULAR | Status: DC | PRN
  Administered 2018-06-15: 3000 [IU] via INTRAVENOUS

## 2018-06-15 MED ORDER — FENTANYL CITRATE (PF) 100 MCG/2ML IJ SOLN
INTRAMUSCULAR | Status: DC | PRN
  Administered 2018-06-15: 25 ug via INTRAVENOUS

## 2018-06-15 MED ORDER — MIDAZOLAM HCL 2 MG/2ML IJ SOLN
INTRAMUSCULAR | Status: DC | PRN
  Administered 2018-06-15: 1 mg via INTRAVENOUS

## 2018-06-15 MED ORDER — HEPARIN SODIUM (PORCINE) 1000 UNIT/ML IJ SOLN
INTRAMUSCULAR | Status: AC
  Filled 2018-06-15: qty 1

## 2018-06-15 MED ORDER — DIPHENHYDRAMINE HCL 50 MG/ML IJ SOLN
INTRAMUSCULAR | Status: AC
  Filled 2018-06-15: qty 1

## 2018-06-15 MED ORDER — IOPAMIDOL (ISOVUE-300) INJECTION 61%
INTRAVENOUS | Status: DC | PRN
  Administered 2018-06-15: 55 mL via INTRA_ARTERIAL

## 2018-06-15 MED ORDER — VERAPAMIL HCL 2.5 MG/ML IV SOLN
INTRAVENOUS | Status: AC
  Filled 2018-06-15: qty 2

## 2018-06-15 MED ORDER — HEPARIN (PORCINE) IN NACL 1000-0.9 UT/500ML-% IV SOLN
INTRAVENOUS | Status: AC
  Filled 2018-06-15: qty 1000

## 2018-06-15 MED ORDER — SODIUM CHLORIDE 0.9% FLUSH
3.0000 mL | Freq: Two times a day (BID) | INTRAVENOUS | Status: DC
  Administered 2018-06-16: 3 mL via INTRAVENOUS

## 2018-06-15 MED ORDER — SODIUM CHLORIDE 0.9 % IV SOLN
1.0000 g | INTRAVENOUS | Status: DC
  Administered 2018-06-15: 1 g via INTRAVENOUS
  Filled 2018-06-15 (×3): qty 10

## 2018-06-15 MED ORDER — SODIUM CHLORIDE 0.9% FLUSH
3.0000 mL | Freq: Two times a day (BID) | INTRAVENOUS | Status: DC
  Administered 2018-06-15: 3 mL via INTRAVENOUS

## 2018-06-15 SURGICAL SUPPLY — 6 items
CATH INFINITI 5FR JK (CATHETERS) ×3 IMPLANT
DEVICE RAD TR BAND REGULAR (VASCULAR PRODUCTS) ×3 IMPLANT
GLIDESHEATH SLEND SS 6F .021 (SHEATH) ×3 IMPLANT
KIT MANI 3VAL PERCEP (MISCELLANEOUS) ×3 IMPLANT
PACK CARDIAC CATH (CUSTOM PROCEDURE TRAY) ×3 IMPLANT
WIRE ROSEN-J .035X260CM (WIRE) ×3 IMPLANT

## 2018-06-15 NOTE — Telephone Encounter (Signed)
Noted that Renee Mack was admitted to the hospital. I will tentatively schedule her to see Dr. Theora Gianotti for Imiquimod discussion for treatment of:  DIAGNOSIS:  A. VULVA; BIOPSY:  - AT LEAST HIGH-GRADE SQUAMOUS INTRAEPITHELIAL LESION (VIN 3; SEVERE  DYSPLASIA).  - DUE TO FRAGMENTATION AND TANGENTIAL TISSUE ORIENTATION, DEFINITIVE  INVASION CANNOT BE RELIABLY ASSESSED.  Appointment arranged 07/05/18 at 1315. I will continue to follow her for discharge.

## 2018-06-15 NOTE — Progress Notes (Signed)
Notified Dr.Willis about thrombus noted on CT. Dr.Willis will place order for anticoagulation and vascular consult.

## 2018-06-15 NOTE — Progress Notes (Signed)
This RN made aware that patient room smells like smoke.  When entering the room, bathroom with strong odor of smoke.  Patient states someone came in her room and they were smokers.  Patient states that she does not smoke, per night nurse, similar issue arose last night.  Attempted to educate patient on not smoking in room.  Will notify MD and charge nurse.

## 2018-06-15 NOTE — Progress Notes (Signed)
ANTICOAGULATION CONSULT NOTE - Initial Consult  Pharmacy Consult for heparin drip Indication: atypical subclavian artery mural thrombus  Allergies  Allergen Reactions  . Bee Venom Itching, Shortness Of Breath and Swelling  . Metformin And Related Shortness Of Breath  . Darvon [Propoxyphene] Itching  . Gabapentin Swelling  . Nsaids Other (See Comments)    Ulcers   . Tramadol Hives  . Contrast Media [Iodinated Diagnostic Agents] Rash    If you benadryl, and steroids she is able to take the contrast per pt    Patient Measurements: Height: 5\' 1"  (154.9 cm) Weight: 139 lb (63 kg) IBW/kg (Calculated) : 47.8 Heparin Dosing Weight: 61 kg  Vital Signs: Temp: 98.1 F (36.7 C) (12/25 1918) Temp Source: Oral (12/25 1918) BP: 124/62 (12/25 2307) Pulse Rate: 68 (12/25 2307)  Labs: Recent Labs    06/13/18 0728 06/13/18 1225 06/13/18 1349 06/13/18 1643  HGB 13.6  --   --   --   HCT 41.6  --   --   --   PLT 228  --   --   --   CREATININE 0.82  --   --   --   TROPONINI <0.03 <0.03 <0.03 <0.03    Estimated Creatinine Clearance: 66.7 mL/min (by C-G formula based on SCr of 0.82 mg/dL).   Medical History: Past Medical History:  Diagnosis Date  . Cancer ([place])    vulvular  . Cervical disc disease    a. 01/2018 MRI Cervical spine: Cervical spondylosis with multilevel disc and facet degeneration greatest at C5-6 and C6-7.  Moderate to sev R C5-6, mod L C5-6, and mod bilat C6-7 foraminal stenosis with multilevel mild foraminal stenosis.  Mild C5-6 and mod C6-7 canal stenosis. C6-7 central cord impingement w/ cord flattening.  . Chronic combined systolic (congestive) and diastolic (congestive) heart failure ([place])    a. 12/2017 Echo: EF 30-35%, mid-apicalanteroseptal, ant, and apical AK. Gr1 DD. Mild conc LVH; b. 03/2018 Echo: EF 45-50%, antsept, ant HK. Mild MR. Nl LA size. Nl RV fxn.  Marland Kitchen COPD (chronic obstructive pulmonary disease) ([facility])    not on home oxygen  . Coronary artery disease     a. 12/2017 ACS/PCI: LAD 100p (2.25x26 Resolute Onyx DES), 43m (2.0x12 Resolute Onyx DES), 80d (2.0x15 Resolute Onyx DES), RCA 90p (non-dominant). EF 25-35%. Post-MI course complicated by CGS; b. 0/3500 NSTEMI/subacute thrombosis-->LAD 100 (PTCA + DES x 1); c. 01/2018 NSTEMI/PCI: LM min irregs, LAD 50-60p ISR/hazy (3.5x12 Resolute DES), 50m/d (underexpansion of prior stents-->PTCA). EF 45-50%.  . Diabetes mellitus without complication ([place])   . GERD (gastroesophageal reflux disease)   . H/O degenerative disc disease   . Hypotension   . Ischemic cardiomyopathy    a. 12/2017 Echo: EF 30-35%; b. 03/2018 Echo: EF 45-50%.  . Myocardial infarction ([place])    a. 12/2017-->DES to LAD x 3.  . Pancreatitis   . Shingles   . Spinal stenosis   . Tobacco abuse   . Ulcer (traumatic) of oral mucosa   . Urinary incontinence     Medications:  No anticoag in PTA meds  Assessment:  Goal of Therapy:  Heparin level 0.3-0.7 units/ml Monitor platelets by anticoagulation protocol: Yes   Plan:  Patient received 40 mg Lovenox at 20:00. Will bolus 2000 units and initial rate of 1050 units/hr. First heparin level 6 hours after start of infusion.  Anayah Arvanitis S 06/15/2018,2:17 AM

## 2018-06-15 NOTE — Progress Notes (Addendum)
[facility] [place] NAME: Renee Mack    MR#:  [id number]  DATE OF BIRTH:  1965/09/19  SUBJECTIVE:  CHIEF COMPLAINT:  Pt still with chest pain 8 out of 10, reporting chest congestion,  pain is worse with deep breaths.  Scheduled for cardiac catheter today  REVIEW OF SYSTEMS:  CONSTITUTIONAL: No fever, fatigue or weakness.  EYES: No blurred or double vision.  EARS, NOSE, AND THROAT: No tinnitus or ear pain.  RESPIRATORY: Reporting intermittent episodes of cough, chest congestion, denies shortness of breath, wheezing or hemoptysis.  CARDIOVASCULAR: has pleuriticchest pain, no orthopnea, edema.  GASTROINTESTINAL: No nausea, vomiting, diarrhea or abdominal pain.  GENITOURINARY: No dysuria, hematuria.  ENDOCRINE: No polyuria, nocturia,  HEMATOLOGY: No anemia, easy bruising or bleeding SKIN: No rash or lesion. MUSCULOSKELETAL: No joint pain or arthritis.   NEUROLOGIC: No tingling, numbness, weakness.  PSYCHIATRY: No anxiety or depression.   DRUG ALLERGIES:   Allergies  Allergen Reactions  . Bee Venom Itching, Shortness Of Breath and Swelling  . Metformin And Related Shortness Of Breath  . Darvon [Propoxyphene] Itching  . Gabapentin Swelling  . Nsaids Other (See Comments)    Ulcers   . Tramadol Hives  . Contrast Media [Iodinated Diagnostic Agents] Rash    If you benadryl, and steroids she is able to take the contrast per pt    VITALS:  Blood pressure (!) 108/57, pulse 73, temperature 98.1 F (36.7 C), temperature source Oral, resp. rate 18, height 5\' 1"  (1.549 m), weight 60.4 kg, SpO2 99 %.  PHYSICAL EXAMINATION:  GENERAL:  52 y.o.-year-old patient lying in the bed with no acute distress.  EYES: Pupils equal, round, reactive to light and accommodation. No scleral icterus. Extraocular muscles intact.  HEENT: Head atraumatic, normocephalic. Oropharynx and nasopharynx clear.  NECK:  Supple, no jugular venous distention. No  thyroid enlargement, no tenderness.  LUNGS: Normal breath sounds bilaterally, no wheezing, rales,rhonchi or crepitation. No use of accessory muscles of respiration.  CARDIOVASCULAR: S1, S2 normal. No murmurs, rubs, or gallops.  ABDOMEN: Soft, nontender, nondistended. Bowel sounds present.  EXTREMITIES: No pedal edema, cyanosis, or clubbing.  NEUROLOGIC: awake and alert . Sensation intact. Gait not checked.  PSYCHIATRIC: The patient is alert and oriented x 3.  SKIN: No obvious rash, lesion, or ulcer.    LABORATORY PANEL:   CBC Recent Labs  Lab 06/15/18 0859  WBC 15.5*  HGB 14.3  HCT 42.7  PLT 259   ------------------------------------------------------------------------------------------------------------------  Chemistries  Recent Labs  Lab 06/13/18 0728  NA 135  K 4.1  CL 104  CO2 23  GLUCOSE 372*  BUN 20  CREATININE 0.82  CALCIUM 8.8*   ------------------------------------------------------------------------------------------------------------------  Cardiac Enzymes Recent Labs  Lab 06/13/18 1643  TROPONINI <0.03   ------------------------------------------------------------------------------------------------------------------  RADIOLOGY:  Ct Angio Chest Pe W Or Wo Contrast  Result Date: 06/15/2018 CLINICAL DATA:  Acute onset of generalized pleuritic chest pain. EXAM: CT ANGIOGRAPHY CHEST WITH CONTRAST TECHNIQUE: Multidetector CT imaging of the chest was performed using the standard protocol during bolus administration of intravenous contrast. Multiplanar CT image reconstructions and MIPs were obtained to evaluate the vascular anatomy. CONTRAST:  44mL ISOVUE-370 IOPAMIDOL (ISOVUE-370) INJECTION 76% COMPARISON:  Chest radiograph performed 06/13/2018, and CTA of the chest performed 01/26/2018 FINDINGS: Cardiovascular:  There is no evidence of pulmonary embolus. The heart is normal in size. Coronary artery calcification is noted. There is mild calcification at the  aortic arch, and mild mural thrombus at the  proximal left subclavian artery, without significant luminal narrowing. Mediastinum/Nodes: The mediastinum is otherwise unremarkable. No mediastinal lymphadenopathy is seen. A calcified left hilar node may reflect remote granulomatous disease. No pericardial effusion is identified. The thyroid gland is unremarkable. No axillary lymphadenopathy is appreciated. Lungs/Pleura: Mild bibasilar atelectasis is noted. The lungs and otherwise clear. No pleural effusion or pneumothorax is seen. No masses are identified. Upper Abdomen: The visualized portions of the liver and spleen are unremarkable. Scattered calcification is seen along the proximal abdominal aorta, with mild slightly irregular mural thrombus. Musculoskeletal: No acute osseous abnormalities are identified. The visualized musculature is unremarkable in appearance. Review of the MIP images confirms the above findings. IMPRESSION: 1. No evidence of pulmonary embolus. 2. Mild bibasilar atelectasis noted. Lungs otherwise clear. 3. Coronary artery calcification noted. 4. Scattered calcification along the proximal abdominal aorta, with mild slightly irregular mural thrombus. Aortic Atherosclerosis (ICD10-I70.0). Electronically Signed   By: Garald Balding M.D.   On: 06/15/2018 00:08    EKG:   Orders placed or performed during the hospital encounter of 06/13/18  . ED EKG within 10 minutes  . ED EKG within 10 minutes    ASSESSMENT AND PLAN:   Chest pain with history of acute MI, drug-eluting stent placement followed by stent thrombosis and restenosis requiring repeated procedure AMI ruled out neg troponins CTA chest to r/o PE  Cardiology consult placed-[facility] is following , iCTA chest neg for pulmonary embolism, scheduled for cardiac cath today Monitor patient on telemetry Ranexa dose increased to thousand milligrams twice a day Continue aspirin 81 mg, Lipitor, Toprol, Brilinta   #URI with bronchitis Check  respiratory panel, influenza IV Rocephin Cough meds as needed  #Mild proximal abdominal aortic thrombus Seen by vascular surgery not recommending any heparin drip or other interventions, as there is no embolization Discontinue heparin drip  #Chronic combined systolic and diastolic congestive heart failure not fluid overloaded at this time Aspirin, Lipitor, Toprol will be continued not on any diuretics  #Diabetes mellitus insulin requiring Diabetic diet, sliding scale insulin and Lantus 50 units every 12 hours (Patient takes 60 units every 12 hours) Diabetic coordinator recommendations appreciated  #Hyperlipidemia statin  #COPD no exacerbation provide breathing treatments as needed Patient reported that she quit smoking  DVT prophylaxis     All the records are reviewed and case discussed with Care Management/Social Workerr. Management plans discussed with the patient, she is in agreement CODE STATUS: fc   TOTAL TIME TAKING CARE OF THIS PATIENT: 36  minutes.   POSSIBLE D/C IN 1-2  DAYS, DEPENDING ON CLINICAL CONDITION.  Note: This dictation was prepared with Dragon dictation along with smaller phrase technology. Any transcriptional errors that result from this process are unintentional.   Nicholes Mango M.D on 06/15/2018 at 12:38 PM  Between 7am to 6pm - Pager - [phone] After 6pm go to [email or web address] - password [email or web address] [facility]  Office  [phone]  CC: Primary care physician; Olin Hauser, DO

## 2018-06-15 NOTE — Progress Notes (Signed)
Patient returned from cath lab s/p heart cath via R radial. Dressing C/D/I.  +CMST.  VSS.

## 2018-06-15 NOTE — Progress Notes (Signed)
Post receiving two norco for pain in the chest that has been going on for several days, patient states her pain is only slightly relieved with the norco.  Blood pressure taken and patient BP is 95/47.  Due to this, will not give morphine at this time.  Spoke to patient and she is agreeable.  Will wait to see if norco continues to bring pain level down.

## 2018-06-15 NOTE — Progress Notes (Signed)
Report given to special recovery for patient to go down for cardiac cath.

## 2018-06-15 NOTE — Progress Notes (Addendum)
Inpatient Diabetes Program Recommendations  AACE/ADA: New Consensus Statement on Inpatient Glycemic Control (2015)  Target Ranges:  Prepandial:   less than 140 mg/dL      Peak postprandial:   less than 180 mg/dL (1-2 hours)      Critically ill patients:  140 - 180 mg/dL   Results for Renee Mack, Renee Mack (MRN [id number]) as of 06/15/2018 08:51  Ref. Range 06/13/2018 12:34 06/13/2018 16:26 06/13/2018 21:10 06/13/2018 23:57  Glucose-Capillary Latest Ref Range: 70 - 99 mg/dL 308 (H)  7 units NOVOLOG +  50 units LANTUS at 2pm  249 (H)  28 units NOVOLOG  73 121 (H)     50 units LANTUS   Results for Renee Mack, Renee Mack (MRN [id number]) as of 06/15/2018 08:51  Ref. Range 06/14/2018 08:06 06/14/2018 11:09 06/14/2018 16:42 06/14/2018 20:41  Glucose-Capillary Latest Ref Range: 70 - 99 mg/dL 110 (H)    50 units LANTUS 152 (H)  27 units NOVOLOG  174 (H)  27 units NOVOLOG  217 (H)  2 units NOVOLOG +  50 units LANTUS    Results for Renee Mack, Renee Mack (MRN [id number]) as of 06/15/2018 08:51  Ref. Range 06/15/2018 08:17  Glucose-Capillary Latest Ref Range: 70 - 99 mg/dL 267 (H)  30 units NOVOLOG +  50 units LANTUS    Admit with: CP/ Atypical subclavian artery mural thrombus  History: DM, CHF, COPD  Home DM Meds: Lantus 60 units BID       Novolog 35 units TID  Current Orders: Lantus 50 units BID      Novolog Sensitive Correction Scale/ SSI (0-9 units) TID AC + HS      Novolog 25 units TID with meals     Note that patient received a total of 150 mg Prednisone throughout the day yesterday (50 mg at 11am, 50 mg at 7pm, and another 50 mg at 11pm).  This is likely why pt's CBG is elevated this AM.  No more Prednisone ordered at this time.  No recommendations yet--CBGs were fairly well controlled on current Insulin regimen prior to Prednisone being given.      Endocrinologist: Drema Halon, NP--[facility] Last seen 06/01/2018 Was instructed to take the  following at that visit: INCREASE Lantus to 60 units twice daily Discontinue NOVOLIN and SWITCH to Novolog with increase from 25 up to 35 units before meals, PLUS sliding scale Novolog 200-250 4 units Novolog 251-300 8 units Novolog 301 - 350 12 units Novolog 351-400 16 units Novolog 400-450 20 units Continue to check blood sugars 4 times daily as you have been and bring meter meter to next clinic visit     --Will follow patient during hospitalization--  Wyn Quaker RN, MSN, CDE Diabetes Coordinator Inpatient Glycemic Control Team Team Pager: [phone] (8a-5p)

## 2018-06-15 NOTE — Progress Notes (Signed)
Progress Note  Patient Name: Renee Mack Date of Encounter: 06/15/2018  Primary Cardiologist: Fletcher Anon  Subjective   Continues to have 8/10 chest pain. CTA chest negative for PE on 12/25 with aortic mural thrombus noted. Vascular consult this morning. Started on heparin gtt overnight.   Inpatient Medications    Scheduled Meds: . aspirin EC  81 mg Oral Daily  . atorvastatin  80 mg Oral q1800  . citalopram  20 mg Oral Daily  . insulin aspart  0-5 Units Subcutaneous QHS  . insulin aspart  0-9 Units Subcutaneous TID WC  . insulin aspart  25 Units Subcutaneous TID AC  . insulin glargine  50 Units Subcutaneous BID  . pantoprazole  40 mg Oral Daily  . ranolazine  1,000 mg Oral BID  . sodium chloride flush  10 mL Intravenous Q12H  . ticagrelor  90 mg Oral BID   Continuous Infusions: . heparin 1,050 Units/hr (06/15/18 0248)   PRN Meds: acetaminophen, albuterol, cyclobenzaprine, HYDROcodone-acetaminophen, LORazepam, morphine injection, nitroGLYCERIN, ondansetron (ZOFRAN) IV, ondansetron, promethazine   Vital Signs    Vitals:   06/14/18 2122 06/14/18 2307 06/15/18 0316 06/15/18 0820  BP: (!) 105/58 124/62 116/67 (!) 104/51  Pulse: 63 68 61 80  Resp:    18  Temp:   97.8 F (36.6 C) 98.1 F (36.7 C)  TempSrc:   Oral Oral  SpO2: 93%  97% 97%  Weight:   60.4 kg   Height:        Intake/Output Summary (Last 24 hours) at 06/15/2018 0900 Last data filed at 06/15/2018 0700 Gross per 24 hour  Intake 303.7 ml  Output 2250 ml  Net -1946.3 ml   Filed Weights   06/13/18 1157 06/14/18 0405 06/15/18 0316  Weight: 62.2 kg 63 kg 60.4 kg    Telemetry    Sinus rhythm with a rare blocked PAC - Personally Reviewed  ECG    n/a - Personally Reviewed  Physical Exam   GEN: No acute distress.   Neck: No JVD. Cardiac: RRR, no murmurs, rubs, or gallops.  Respiratory: Clear to auscultation bilaterally.  GI: Soft, nontender, non-distended.   MS: No edema; No deformity. Neuro:   Alert and oriented x 3; Nonfocal.  Psych: Normal affect.  Labs    Chemistry Recent Labs  Lab 06/13/18 0728  NA 135  K 4.1  CL 104  CO2 23  GLUCOSE 372*  BUN 20  CREATININE 0.82  CALCIUM 8.8*  GFRNONAA >60  GFRAA >60  ANIONGAP 8     Hematology Recent Labs  Lab 06/13/18 0728  WBC 9.0  RBC 4.36  HGB 13.6  HCT 41.6  MCV 95.4  MCH 31.2  MCHC 32.7  RDW 13.4  PLT 228    Cardiac Enzymes Recent Labs  Lab 06/13/18 0728 06/13/18 1225 06/13/18 1349 06/13/18 1643  TROPONINI <0.03 <0.03 <0.03 <0.03   No results for input(s): TROPIPOC in the last 168 hours.   BNPNo results for input(s): BNP, PROBNP in the last 168 hours.   DDimer No results for input(s): DDIMER in the last 168 hours.   Radiology    Ct Angio Chest Pe W Or Wo Contrast  Result Date: 06/15/2018 IMPRESSION: 1. No evidence of pulmonary embolus. 2. Mild bibasilar atelectasis noted. Lungs otherwise clear. 3. Coronary artery calcification noted. 4. Scattered calcification along the proximal abdominal aorta, with mild slightly irregular mural thrombus. Aortic Atherosclerosis (ICD10-I70.0). Electronically Signed   By: Garald Balding M.D.   On: 06/15/2018 00:08  Cardiac Studies   Echo 03/2018: Study Conclusions  - Left ventricle: The cavity size was normal. Systolic function was mildly reduced. The estimated ejection fraction was in the range of 45% to 50%. Hypokinesis of the anteroseptal myocardium. Hypokinesis of the anterior myocardium. Left ventricular diastolic function parameters were normal. - Mitral valve: There was mild regurgitation. - Left atrium: The atrium was normal in size. - Right ventricle: Systolic function was normal. - Pulmonary arteries: Systolic pressure could not be accurately estimated.  Impressions:  - Compared to previous study, EF has improved. __________  LHC 05/22/2018: Conclusion     Ost 2nd Diag to 2nd Diag lesion is 90% stenosed.  Previously  placed Prox LAD drug eluting stent is widely patent.  Prox Cx lesion is 30% stenosed.  Prox RCA lesion is 90% stenosed.  Mid LAD lesion is 95% stenosed.  Previously placed Mid LAD to Dist LAD stent (unknown type) is widely patent.  Post intervention, there is a 0% residual stenosis.  Balloon angioplasty was performed using a BALLOON [place] [id number].  LV end diastolic pressure is normal.  1. Significant underlying three-vessel coronary artery disease with patent LAD stents except in the mid to distal segment where there is 95% stenosis that appears to be a combination of restenosis and possible localized thrombosis (given that that lesion was soft to dilation). Stable proximal RCA disease in a nondominant vessel. 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  2. Normal left ventricular end-diastolic pressure. Left ventricular angiography was not performed. 3. Successful balloon angioplasty to the mid LAD with noncompliant balloon high pressure.  Recommendations: Continue indefinite dual antiplatelet therapy. Aggressive treatment of risk factors. Likely discharge home tomorrow.    Patient Profile     52 y.o. female with history of CAD s/pMI and drug-eluting stent placement to LAD x3 in7/2019,with subsequent readmission for subacute stent thrombosis and repeat PCI x3 s/p PTCA most recently in 62/7035, combined systolic and diastolic congestive heart failure, ischemic cardiomyopathy with EF of 45 to 50%, former tobacco abuse, COPD, hyperlipidemia, GERD,poorly controlledtype 2 diabetes mellitus, and pancreatitis who is being seen today for the evaluation of chest pain.  Assessment & Plan    1. CAD involving the native coronary arteries with atypical chest pain: -Currently, continues to note 8/10 chest pain -She has ruled out and EKG is not acute -CTA chest negative for PE -Schedule LHC this morning with Dr.  Fletcher Anon -NPO -DAPT with ASA and Brilinta -Ranexa 1000 mg bid -Risks and benefits of cardiac catheterization have been discussed with the patient including risks of bleeding, bruising, infection, kidney damage, injury to a limb, stroke, heart attack, urgent need for bypass, and death. The patient understands these risks and is willing to proceed with the procedure. All questions have been answered and concerns listened to  2. Aortic mural thrombus: -Discussed with MD, anticoagulation is typically only needed with embolism -IM has started heparin gtt and consulted vascular surgery  -Discussed with vascular surgery, no intervention or medication change needed at this time outside of stopping heparin   3. Mild ICM: -She has not been able to tolerate small dose beta blocker or ACEi due to relative hypotension  -Advance evidence-based heart failure therapy as/if able  4. COPD: -Concern for possible ongoing smoking while admitted, patient denies this and indicates she no longer smokes    For questions or updates, please contact [place] Please consult [email or web address] for contact info under Cardiology/STEMI.  Signed, Christell Faith, PA-C [place] Pager: [phone] 06/15/2018, 9:00 AM

## 2018-06-15 NOTE — Progress Notes (Signed)
ANTICOAGULATION CONSULT NOTE - Initial Consult  Pharmacy Consult for heparin drip Indication: atypical subclavian artery mural thrombus  Allergies  Allergen Reactions  . Bee Venom Itching, Shortness Of Breath and Swelling  . Metformin And Related Shortness Of Breath  . Darvon [Propoxyphene] Itching  . Gabapentin Swelling  . Nsaids Other (See Comments)    Ulcers   . Tramadol Hives  . Contrast Media [Iodinated Diagnostic Agents] Rash    If you benadryl, and steroids she is able to take the contrast per pt    Patient Measurements: Height: 5\' 1"  (154.9 cm) Weight: 133 lb 1.6 oz (60.4 kg) IBW/kg (Calculated) : 47.8 Heparin Dosing Weight: 61 kg  Vital Signs: Temp: 98.1 F (36.7 C) (12/26 0820) Temp Source: Oral (12/26 0820) BP: 95/47 (12/26 0950) Pulse Rate: 78 (12/26 0950)  Labs: Recent Labs    06/13/18 0728 06/13/18 1225 06/13/18 1349 06/13/18 1643 06/15/18 0150 06/15/18 0859  HGB 13.6  --   --   --   --  14.3  HCT 41.6  --   --   --   --  42.7  PLT 228  --   --   --   --  259  APTT  --   --   --   --  31  --   LABPROT  --   --   --   --  13.4  --   INR  --   --   --   --  1.03  --   HEPARINUNFRC  --   --   --   --   --  0.44  CREATININE 0.82  --   --   --   --   --   TROPONINI <0.03 <0.03 <0.03 <0.03  --   --     Estimated Creatinine Clearance: 65.4 mL/min (by C-G formula based on SCr of 0.82 mg/dL).   Medical History: Past Medical History:  Diagnosis Date  . Cancer ([place])    vulvular  . Cervical disc disease    a. 01/2018 MRI Cervical spine: Cervical spondylosis with multilevel disc and facet degeneration greatest at C5-6 and C6-7.  Moderate to sev R C5-6, mod L C5-6, and mod bilat C6-7 foraminal stenosis with multilevel mild foraminal stenosis.  Mild C5-6 and mod C6-7 canal stenosis. C6-7 central cord impingement w/ cord flattening.  . Chronic combined systolic (congestive) and diastolic (congestive) heart failure ([place])    a. 12/2017 Echo: EF 30-35%,  mid-apicalanteroseptal, ant, and apical AK. Gr1 DD. Mild conc LVH; b. 03/2018 Echo: EF 45-50%, antsept, ant HK. Mild MR. Nl LA size. Nl RV fxn.  Marland Kitchen COPD (chronic obstructive pulmonary disease) (HCC)    not on home oxygen  . Coronary artery disease    a. 12/2017 ACS/PCI: LAD 100p (2.25x26 Resolute Onyx DES), 76m (2.0x12 Resolute Onyx DES), 80d (2.0x15 Resolute Onyx DES), RCA 90p (non-dominant). EF 25-35%. Post-MI course complicated by CGS; b. 09/84 NSTEMI/subacute thrombosis-->LAD 100 (PTCA + DES x 1); c. 01/2018 NSTEMI/PCI: LM min irregs, LAD 50-60p ISR/hazy (3.5x12 Resolute DES), 67m/d (underexpansion of prior stents-->PTCA). EF 45-50%.  . Diabetes mellitus without complication ([place])   . GERD (gastroesophageal reflux disease)   . H/O degenerative disc disease   . Hypotension   . Ischemic cardiomyopathy    a. 12/2017 Echo: EF 30-35%; b. 03/2018 Echo: EF 45-50%.  . Myocardial infarction ([place])    a. 12/2017-->DES to LAD x 3.  . Pancreatitis   . Shingles   .  Spinal stenosis   . Tobacco abuse   . Ulcer (traumatic) of oral mucosa   . Urinary incontinence     Medications:  No anticoag in PTA meds  Assessment:  12/26 HL 0859 0.44 (therapeutic)   Goal of Therapy:  Heparin level 0.3-0.7 units/ml Monitor platelets by anticoagulation protocol: Yes   Plan:  Continue heparin rate of 1050 units/hr. second heparin level 6 hours after first heparin level.  Lu Duffel, PharmD, BCPS Clinical Pharmacist 06/15/2018 11:14 AM

## 2018-06-15 NOTE — Consult Note (Signed)
Reason for Consult: Aortic Thrombus Referring Physician: Dr. Anastasia Fiedler is an 52 y.o. female.  HPI:  Patient  With CAD s/pMI and drug-eluting stent placement to LAD x3 in7/2019,with subsequent readmission for subacute stent thrombosis and repeat PCI x3s/p PTCA most recently in 05/2018, CHF, DM, tobacco use admitted for chest pain. CT Chest obtained revealed mild proximal abdominal aortic thrombus.  Past Medical History:  Diagnosis Date  . Cancer ([place])    vulvular  . Cervical disc disease    a. 01/2018 MRI Cervical spine: Cervical spondylosis with multilevel disc and facet degeneration greatest at C5-6 and C6-7.  Moderate to sev R C5-6, mod L C5-6, and mod bilat C6-7 foraminal stenosis with multilevel mild foraminal stenosis.  Mild C5-6 and mod C6-7 canal stenosis. C6-7 central cord impingement w/ cord flattening.  . Chronic combined systolic (congestive) and diastolic (congestive) heart failure ([place])    a. 12/2017 Echo: EF 30-35%, mid-apicalanteroseptal, ant, and apical AK. Gr1 DD. Mild conc LVH; b. 03/2018 Echo: EF 45-50%, antsept, ant HK. Mild MR. Nl LA size. Nl RV fxn.  Marland Kitchen COPD (chronic obstructive pulmonary disease) (HCC)    not on home oxygen  . Coronary artery disease    a. 12/2017 ACS/PCI: LAD 100p (2.25x26 Resolute Onyx DES), 79m (2.0x12 Resolute Onyx DES), 80d (2.0x15 Resolute Onyx DES), RCA 90p (non-dominant). EF 25-35%. Post-MI course complicated by CGS; b. 0/8676 NSTEMI/subacute thrombosis-->LAD 100 (PTCA + DES x 1); c. 01/2018 NSTEMI/PCI: LM min irregs, LAD 50-60p ISR/hazy (3.5x12 Resolute DES), 55m/d (underexpansion of prior stents-->PTCA). EF 45-50%.  . Diabetes mellitus without complication ([place])   . GERD (gastroesophageal reflux disease)   . H/O degenerative disc disease   . Hypotension   . Ischemic cardiomyopathy    a. 12/2017 Echo: EF 30-35%; b. 03/2018 Echo: EF 45-50%.  . Myocardial infarction ([place])    a. 12/2017-->DES to LAD x 3.  . Pancreatitis   . Shingles    . Spinal stenosis   . Tobacco abuse   . Ulcer (traumatic) of oral mucosa   . Urinary incontinence     Past Surgical History:  Procedure Laterality Date  . ABDOMINAL HYSTERECTOMY     partial  . CORONARY BALLOON ANGIOPLASTY N/A 05/22/2018   Procedure: CORONARY BALLOON ANGIOPLASTY;  Surgeon: Wellington Hampshire, MD;  Location: [facility];  Service: Cardiovascular;  Laterality: N/A;  . CORONARY STENT INTERVENTION N/A 12/26/2017   Procedure: CORONARY STENT INTERVENTION;  Surgeon: Wellington Hampshire, MD;  Location: [facility];  Service: Cardiovascular;  Laterality: N/A;  . CORONARY STENT INTERVENTION N/A 02/08/2018   Procedure: CORONARY STENT INTERVENTION;  Surgeon: Nelva Bush, MD;  Location: [facility];  Service: Cardiovascular;  Laterality: N/A;  . CORONARY/GRAFT ACUTE MI REVASCULARIZATION N/A 12/19/2017   Procedure: Coronary/Graft Acute MI Revascularization;  Surgeon: Wellington Hampshire, MD;  Location: [facility];  Service: Cardiovascular;  Laterality: N/A;  . ECTOPIC PREGNANCY SURGERY Left   . INTRAVASCULAR ULTRASOUND/IVUS N/A 02/08/2018   Procedure: Intravascular Ultrasound/IVUS;  Surgeon: Nelva Bush, MD;  Location: [facility];  Service: Cardiovascular;  Laterality: N/A;  . LEFT HEART CATH AND CORONARY ANGIOGRAPHY N/A 12/19/2017   Procedure: LEFT HEART CATH AND CORONARY ANGIOGRAPHY;  Surgeon: Wellington Hampshire, MD;  Location: [facility];  Service: Cardiovascular;  Laterality: N/A;  . LEFT HEART CATH AND CORONARY ANGIOGRAPHY N/A 12/26/2017   Procedure: LEFT HEART CATH AND CORONARY ANGIOGRAPHY;  Surgeon: Wellington Hampshire, MD;  Location:  [facility];  Service: Cardiovascular;  Laterality: N/A;  . LEFT HEART CATH AND CORONARY ANGIOGRAPHY N/A 02/08/2018   Procedure: LEFT HEART CATH AND CORONARY ANGIOGRAPHY;  Surgeon: Nelva Bush, MD;  Location: [facility];  Service: Cardiovascular;  Laterality: N/A;  . LEFT HEART  CATH AND CORONARY ANGIOGRAPHY N/A 05/22/2018   Procedure: LEFT HEART CATH AND CORONARY ANGIOGRAPHY;  Surgeon: Wellington Hampshire, MD;  Location: [facility];  Service: Cardiovascular;  Laterality: N/A;  . LYMPHADENECTOMY    . SPLENECTOMY, PARTIAL    . vulvulasectomy      Family History  Problem Relation Age of Onset  . Non-Hodgkin's lymphoma Mother   . Osteoporosis Mother   . Lupus Sister   . Heart disease Brother   . Heart attack Brother   . Heart attack Father     Social History:  reports that she quit smoking about 5 months ago. Her smoking use included cigarettes. She has a 15.00 pack-year smoking history. She has never used smokeless tobacco. She reports that she does not drink alcohol or use drugs.  Allergies:  Allergies  Allergen Reactions  . Bee Venom Itching, Shortness Of Breath and Swelling  . Metformin And Related Shortness Of Breath  . Darvon [Propoxyphene] Itching  . Gabapentin Swelling  . Nsaids Other (See Comments)    Ulcers   . Tramadol Hives  . Contrast Media [Iodinated Diagnostic Agents] Rash    If you benadryl, and steroids she is able to take the contrast per pt    Medications: I have reviewed the patient's current medications.  Results for orders placed or performed during the hospital encounter of 06/13/18 (from the past 48 hour(s))  HIV antibody (Routine Testing)     Status: None   Collection Time: 06/13/18 12:25 PM  Result Value Ref Range   HIV Screen 4th Generation wRfx Non Reactive Non Reactive    Comment: (NOTE) Performed At: [facility] [address] [place], [place] [id number] Rush Farmer MD BH:[id number]   Troponin I - Now Then Q3H     Status: None   Collection Time: 06/13/18 12:25 PM  Result Value Ref Range   Troponin I <0.03 <0.03 ng/mL    Comment: Performed at [facility], [address] [place], [place] [address]  Glucose, capillary     Status: Abnormal   Collection Time: 06/13/18 12:34 PM  Result  Value Ref Range   Glucose-Capillary 308 (H) 70 - 99 mg/dL  Troponin I - Now Then Q3H     Status: None   Collection Time: 06/13/18  1:49 PM  Result Value Ref Range   Troponin I <0.03 <0.03 ng/mL    Comment: Performed at [facility], [address] [place], [place] [address]  Glucose, capillary     Status: Abnormal   Collection Time: 06/13/18  4:26 PM  Result Value Ref Range   Glucose-Capillary 249 (H) 70 - 99 mg/dL  Troponin I - Now Then Q3H     Status: None   Collection Time: 06/13/18  4:43 PM  Result Value Ref Range   Troponin I <0.03 <0.03 ng/mL    Comment: Performed at [facility], [address] [place], [place] [address]  Glucose, capillary     Status: None   Collection Time: 06/13/18  9:10 PM  Result Value Ref Range   Glucose-Capillary 73 70 - 99 mg/dL  Glucose, capillary     Status: Abnormal   Collection Time: 06/13/18 11:57  PM  Result Value Ref Range   Glucose-Capillary 121 (H) 70 - 99 mg/dL  Glucose, capillary     Status: Abnormal   Collection Time: 06/14/18  8:06 AM  Result Value Ref Range   Glucose-Capillary 110 (H) 70 - 99 mg/dL   Comment 1 Notify RN    Comment 2 Document in Chart   Glucose, capillary     Status: Abnormal   Collection Time: 06/14/18 11:09 AM  Result Value Ref Range   Glucose-Capillary 152 (H) 70 - 99 mg/dL  Glucose, capillary     Status: Abnormal   Collection Time: 06/14/18  4:42 PM  Result Value Ref Range   Glucose-Capillary 174 (H) 70 - 99 mg/dL   Comment 1 Notify RN    Comment 2 Document in Chart   Glucose, capillary     Status: Abnormal   Collection Time: 06/14/18  8:41 PM  Result Value Ref Range   Glucose-Capillary 217 (H) 70 - 99 mg/dL   Comment 1 Notify RN    Comment 2 Document in Chart   APTT     Status: None   Collection Time: 06/15/18  1:50 AM  Result Value Ref Range   aPTT 31 24 - 36 seconds    Comment: Performed at [facility], [address] [place], [place] [address]  Protime-INR      Status: None   Collection Time: 06/15/18  1:50 AM  Result Value Ref Range   Prothrombin Time 13.4 11.4 - 15.2 seconds   INR 1.03     Comment: Performed at [facility], [address] [place], [place] [address]  Glucose, capillary     Status: Abnormal   Collection Time: 06/15/18  8:17 AM  Result Value Ref Range   Glucose-Capillary 267 (H) 70 - 99 mg/dL   Comment 1 Notify RN   Heparin level (unfractionated)     Status: None   Collection Time: 06/15/18  8:59 AM  Result Value Ref Range   Heparin Unfractionated 0.44 0.30 - 0.70 IU/mL    Comment: (NOTE) If heparin results are below expected values, and patient dosage has  been confirmed, suggest follow up testing of antithrombin III levels. Performed at [facility], [address] [place], [place] [address]   CBC     Status: Abnormal   Collection Time: 06/15/18  8:59 AM  Result Value Ref Range   WBC 15.5 (H) 4.0 - 10.5 K/uL   RBC 4.58 3.87 - 5.11 MIL/uL   Hemoglobin 14.3 12.0 - 15.0 g/dL   HCT 42.7 36.0 - 46.0 %   MCV 93.2 80.0 - 100.0 fL   MCH 31.2 26.0 - 34.0 pg   MCHC 33.5 30.0 - 36.0 g/dL   RDW 13.8 11.5 - 15.5 %   Platelets 259 150 - 400 K/uL   nRBC 0.0 0.0 - 0.2 %    Comment: Performed at [facility], [address] [place],  [address]    Ct Angio Chest Pe W Or Wo Contrast  Result Date: 06/15/2018 CLINICAL DATA:  Acute onset of generalized pleuritic chest pain. EXAM: CT ANGIOGRAPHY CHEST WITH CONTRAST TECHNIQUE: Multidetector CT imaging of the chest was performed using the standard protocol during bolus administration of intravenous contrast. Multiplanar CT image reconstructions and MIPs were obtained to evaluate the vascular anatomy. CONTRAST:  27mL ISOVUE-370 IOPAMIDOL (ISOVUE-370) INJECTION 76% COMPARISON:  Chest radiograph performed 06/13/2018, and CTA of the chest performed 01/26/2018 FINDINGS: Cardiovascular:  There is no evidence of  pulmonary embolus. The heart is normal in  size. Coronary artery calcification is noted. There is mild calcification at the aortic arch, and mild mural thrombus at the proximal left subclavian artery, without significant luminal narrowing. Mediastinum/Nodes: The mediastinum is otherwise unremarkable. No mediastinal lymphadenopathy is seen. A calcified left hilar node may reflect remote granulomatous disease. No pericardial effusion is identified. The thyroid gland is unremarkable. No axillary lymphadenopathy is appreciated. Lungs/Pleura: Mild bibasilar atelectasis is noted. The lungs and otherwise clear. No pleural effusion or pneumothorax is seen. No masses are identified. Upper Abdomen: The visualized portions of the liver and spleen are unremarkable. Scattered calcification is seen along the proximal abdominal aorta, with mild slightly irregular mural thrombus. Musculoskeletal: No acute osseous abnormalities are identified. The visualized musculature is unremarkable in appearance. Review of the MIP images confirms the above findings. IMPRESSION: 1. No evidence of pulmonary embolus. 2. Mild bibasilar atelectasis noted. Lungs otherwise clear. 3. Coronary artery calcification noted. 4. Scattered calcification along the proximal abdominal aorta, with mild slightly irregular mural thrombus. Aortic Atherosclerosis (ICD10-I70.0). Electronically Signed   By: Garald Balding M.D.   On: 06/15/2018 00:08    Review of Systems  Constitutional: Negative for fever.  Respiratory: Negative for cough.   Cardiovascular: Positive for chest pain. Negative for leg swelling.  Gastrointestinal: Negative.   Musculoskeletal:       Neuropathy  Neurological: Negative for dizziness.   Blood pressure (!) 104/51, pulse 80, temperature 98.1 F (36.7 C), temperature source Oral, resp. rate 18, height 5\' 1"  (1.549 m), weight 60.4 kg, SpO2 97 %. Physical Exam  Nursing note and vitals reviewed. Constitutional: She is oriented to person, place, and time. She appears  well-developed and well-nourished.  Cardiovascular: Intact distal pulses.  GI: Soft. She exhibits no distension. There is no abdominal tenderness.  Musculoskeletal:        General: No edema.  Neurological: She is alert and oriented to person, place, and time.    Assessment/Plan: Mild abdominal aortic thrombus.  No intervention or Heparin needed. No evidence of distal embolus or thrombosis. Please call/reconsult for further questions/concerns.  Esco, Miechia A 06/15/2018, 10:23 AM

## 2018-06-16 LAB — BASIC METABOLIC PANEL
Anion gap: 7 (ref 5–15)
BUN: 23 mg/dL — ABNORMAL HIGH (ref 6–20)
CO2: 23 mmol/L (ref 22–32)
CREATININE: 0.55 mg/dL (ref 0.44–1.00)
Calcium: 8.9 mg/dL (ref 8.9–10.3)
Chloride: 105 mmol/L (ref 98–111)
GFR calc Af Amer: >60 mL/min (ref >60)
GFR calc non Af Amer: >60 mL/min (ref >60)
Glucose, Bld: 266 mg/dL — ABNORMAL HIGH (ref 70–99)
Potassium: 4.1 mmol/L (ref 3.5–5.1)
Sodium: 135 mmol/L (ref 135–145)

## 2018-06-16 LAB — RESPIRATORY PANEL BY PCR
ADENOVIRUS-RVPPCR: NOT DETECTED
Bordetella pertussis: NOT DETECTED
Chlamydophila pneumoniae: NOT DETECTED
Coronavirus 229E: NOT DETECTED
Coronavirus HKU1: NOT DETECTED
Coronavirus NL63: NOT DETECTED
Coronavirus OC43: NOT DETECTED
Influenza A: NOT DETECTED
Influenza B: NOT DETECTED
METAPNEUMOVIRUS-RVPPCR: NOT DETECTED
Mycoplasma pneumoniae: NOT DETECTED
Parainfluenza Virus 1: NOT DETECTED
Parainfluenza Virus 2: NOT DETECTED
Parainfluenza Virus 3: NOT DETECTED
Parainfluenza Virus 4: NOT DETECTED
Respiratory Syncytial Virus: NOT DETECTED
Rhinovirus / Enterovirus: NOT DETECTED

## 2018-06-16 LAB — CBC
HCT: 42.3 % (ref 36.0–46.0)
Hemoglobin: 13.7 g/dL (ref 12.0–15.0)
MCH: 30.5 pg (ref 26.0–34.0)
MCHC: 32.4 g/dL (ref 30.0–36.0)
MCV: 94.2 fL (ref 80.0–100.0)
Platelets: 241 10*3/uL (ref 150–400)
RBC: 4.49 MIL/uL (ref 3.87–5.11)
RDW: 13.9 % (ref 11.5–15.5)
WBC: 15.8 10*3/uL — ABNORMAL HIGH (ref 4.0–10.5)
nRBC: 0 % (ref 0.0–0.2)

## 2018-06-16 LAB — GLUCOSE, CAPILLARY
Glucose-Capillary: 200 mg/dL — ABNORMAL HIGH (ref 70–99)
Glucose-Capillary: 77 mg/dL (ref 70–99)

## 2018-06-16 MED ORDER — INSULIN GLARGINE 100 UNIT/ML ~~LOC~~ SOLN: 55.0000 [IU] | Freq: Two times a day (BID) | SUBCUTANEOUS | 1 refills | Status: DC

## 2018-06-16 MED ORDER — CYCLOBENZAPRINE HCL 5 MG PO TABS: 5.0000 mg | ORAL_TABLET | Freq: Three times a day (TID) | ORAL | 0 refills | Status: DC | PRN

## 2018-06-16 MED ORDER — NITROGLYCERIN 0.4 MG SL SUBL: 0.4000 mg | SUBLINGUAL_TABLET | SUBLINGUAL | 0 refills | Status: AC | PRN

## 2018-06-16 MED ORDER — CEFDINIR 300 MG PO CAPS: 300.0000 mg | ORAL_CAPSULE | Freq: Two times a day (BID) | ORAL | 0 refills | Status: DC

## 2018-06-16 MED ORDER — ALBUTEROL SULFATE HFA 108 (90 BASE) MCG/ACT IN AERS: 2.0000 | INHALATION_SPRAY | Freq: Four times a day (QID) | RESPIRATORY_TRACT | 0 refills | Status: DC | PRN

## 2018-06-16 MED ORDER — CEFDINIR 300 MG PO CAPS
300.0000 mg | ORAL_CAPSULE | Freq: Two times a day (BID) | ORAL | Status: DC
  Administered 2018-06-16: 300 mg via ORAL
  Filled 2018-06-16 (×2): qty 1

## 2018-06-16 MED ORDER — RANOLAZINE ER 1000 MG PO TB12: 1000.0000 mg | ORAL_TABLET | Freq: Two times a day (BID) | ORAL | 0 refills | Status: DC

## 2018-06-16 MED ORDER — OXYCODONE HCL 5 MG PO TABS: 5.0000 mg | ORAL_TABLET | Freq: Four times a day (QID) | ORAL | 0 refills | Status: DC | PRN

## 2018-06-16 NOTE — Progress Notes (Addendum)
Inpatient Diabetes Program Recommendations  AACE/ADA: New Consensus Statement on Inpatient Glycemic Control (2015)  Target Ranges:  Prepandial:   less than 140 mg/dL      Peak postprandial:   less than 180 mg/dL (1-2 hours)      Critically ill patients:  140 - 180 mg/dL   Results for Renee Mack, Renee Mack (MRN [id number]) as of 06/16/2018 08:49  Ref. Range 06/14/2018 08:06 06/14/2018 11:09 06/14/2018 16:42 06/14/2018 20:41  Glucose-Capillary Latest Ref Range: 70 - 99 mg/dL 110 (H)     50 units LANTUS given at 9am 152 (H)  27 units NOVOLOG  174 (H)  27 units NOVOLOG  217 (H)  2 units NOVOLOG +  50 units LANTUS    Results for Renee Mack, Renee Mack (MRN [id number]) as of 06/16/2018 08:49  Ref. Range 06/15/2018 08:17 06/15/2018 11:33 06/15/2018 14:02 06/15/2018 17:08 06/15/2018 20:48  Glucose-Capillary Latest Ref Range: 70 - 99 mg/dL 267 (H)  30 units NOVOLOG +  50 units LANTUS  162 (H)    125 mg IV Solumedrol given at 12:47pm 107 (H) 271 (H)  30 units NOVOLOG  248 (H)  2 units NOVOLOG +  50 units LANTUS    Results for Renee Mack, Renee Mack (MRN [id number]) as of 06/16/2018 08:49  Ref. Range 06/16/2018 08:11  Glucose-Capillary Latest Ref Range: 70 - 99 mg/dL 200 (H)    Home DM Meds: Lantus 60 units BID                             Novolog 35 units TID  Current Orders: Lantus 50 units BID                            Novolog Sensitive Correction Scale/ SSI (0-9 units) TID AC + HS                            Novolog 25 units TID with meals     Note that patient received a total of 150 mg Prednisone throughout the day on 12/25 (50 mg at 11am, 50 mg at 7pm, and another 50 mg at 11pm)--This is likely why pt's CBG was elevated yesterday AM (12/26).  Also note that pt received 125 mg Solumedrol X 1 dose yesterday at 12:47pm for Cardiac Cath--This is likely why her CBGs ran high late last evening.  No recommendations today---CBGs were fairly well controlled on current Insulin regimen  prior to Prednisone and Solumedrol being given.     Endocrinologist: Drema Halon, NP--[facility] Last seen 06/01/2018 Was instructed to take the following at that visit: INCREASE Lantus to 60 units twice daily Discontinue NOVOLIN and SWITCH to Novolog with increase from 25 up to 35 units before meals, PLUS sliding scale Novolog 200-250 4 units Novolog 251-300 8 units Novolog 301 - 350 12 units Novolog 351-400 16 units Novolog 400-450 20 units Continue to check blood sugars 4 times daily as you have been and bring meter meter to next clinic visit      --Will follow patient during hospitalization--  Wyn Quaker RN, MSN, CDE Diabetes Coordinator Inpatient Glycemic Control Team Team Pager: [phone] (8a-5p)

## 2018-06-16 NOTE — Discharge Instructions (Signed)
Follow-up with primary care physician in 3 to 4 days Follow-up with cardiology Dr. Fletcher Anon in 1 week

## 2018-06-16 NOTE — Progress Notes (Signed)
Renee Mack Come to be D/C'd Home per MD order.  Discussed prescriptions and follow up appointments with the patient. Prescriptions given to patient, medication list explained in detail. Pt verbalized understanding.  Allergies as of 06/16/2018      Reactions   Bee Venom Itching, Shortness Of Breath, Swelling   Metformin And Related Shortness Of Breath   Darvon [propoxyphene] Itching   Gabapentin Swelling   Nsaids Other (See Comments)   Ulcers   Tramadol Hives   Contrast Media [iodinated Diagnostic Agents] Rash   If you benadryl, and steroids she is able to take the contrast per pt      Medication List    STOP taking these medications   terconazole 0.8 % vaginal cream Commonly known as:  TERAZOL 3     TAKE these medications   acetaminophen 325 MG tablet Commonly known as:  TYLENOL Take 650 mg by mouth every 6 (six) hours as needed for mild pain.   aspirin 81 MG tablet Take 81 mg by mouth daily.   atorvastatin 80 MG tablet Commonly known as:  LIPITOR Take 1 tablet (80 mg total) by mouth daily at 6 PM.   cefdinir 300 MG capsule Commonly known as:  OMNICEF Take 1 capsule (300 mg total) by mouth every 12 (twelve) hours.   citalopram 20 MG tablet Commonly known as:  CELEXA Take 1 tablet (20 mg total) by mouth daily.   cyclobenzaprine 5 MG tablet Commonly known as:  FLEXERIL Take 1 tablet (5 mg total) by mouth 3 (three) times daily as needed for muscle spasms.   esomeprazole 40 MG capsule Commonly known as:  NEXIUM Take 40 mg by mouth daily.   insulin aspart 100 UNIT/ML injection Commonly known as:  novoLOG Inject 25 Units into the skin 3 (three) times daily before meals. Take 10 units PLUS sliding scale What changed:  how much to take   insulin glargine 100 UNIT/ML injection Commonly known as:  LANTUS Inject 0.55 mLs (55 Units total) into the skin 2 (two) times daily. What changed:    how much to take  when to take this   lidocaine 2 % jelly Commonly known as:   XYLOCAINE Apply 1 application topically as needed.   LORazepam 1 MG tablet Commonly known as:  ATIVAN Take 1 tablet (1 mg total) by mouth 2 (two) times daily as needed for anxiety.   metoprolol succinate 25 MG 24 hr tablet Commonly known as:  TOPROL-XL Take 1 tablet (25 mg total) by mouth daily.   nitroGLYCERIN 0.4 MG SL tablet Commonly known as:  NITROSTAT Place 1 tablet (0.4 mg total) under the tongue every 5 (five) minutes as needed for chest pain.   ondansetron 4 MG tablet Commonly known as:  ZOFRAN Take 1 tablet (4 mg total) by mouth daily as needed.   oxyCODONE 5 MG immediate release tablet Commonly known as:  Oxy IR/ROXICODONE Take 1 tablet (5 mg total) by mouth every 6 (six) hours as needed for severe pain or breakthrough pain.   PROAIR HFA 108 (90 Base) MCG/ACT inhaler Generic drug:  albuterol Inhale 1 puff into the lungs every 6 (six) hours as needed for wheezing or shortness of breath. What changed:  Another medication with the same name was added. Make sure you understand how and when to take each.   albuterol 108 (90 Base) MCG/ACT inhaler Commonly known as:  PROVENTIL HFA;VENTOLIN HFA Inhale 2 puffs into the lungs every 6 (six) hours as needed for wheezing or shortness  of breath. What changed:  You were already taking a medication with the same name, and this prescription was added. Make sure you understand how and when to take each.   promethazine 25 MG tablet Commonly known as:  PHENERGAN Take 25 mg by mouth every 6 (six) hours as needed for nausea. for nausea   ranolazine 1000 MG SR tablet Commonly known as:  RANEXA Take 1 tablet (1,000 mg total) by mouth 2 (two) times daily. What changed:    medication strength  how much to take   ticagrelor 90 MG Tabs tablet Commonly known as:  BRILINTA Take 90 mg by mouth 2 (two) times daily.       Vitals:   06/16/18 0847 06/16/18 1112  BP: 120/66 132/70  Pulse: 66 68  Resp: 18   Temp: 99.5 F (37.5 C)    SpO2: 99% 98%    Tele box removed and returned. Skin clean, dry and intact without evidence of skin break down, no evidence of skin tears noted. IV catheter discontinued intact. Site without signs and symptoms of complications. Dressing and pressure applied. Pt denies pain at this time. No complaints noted.  An After Visit Summary was printed and given to the patient. Patient escorted via [place], and D/C home via private auto.  Renee Mack

## 2018-06-16 NOTE — Discharge Summary (Signed)
[facility] NAME: Renee Mack    MR#:  [id number]  DATE OF BIRTH:  14-Sep-1965  DATE OF ADMISSION:  06/13/2018 ADMITTING PHYSICIAN: Nicholes Mango, MD  DATE OF DISCHARGE:  06/16/18   PRIMARY CARE PHYSICIAN: Olin Hauser, DO    ADMISSION DIAGNOSIS:  Other chest pain [R07.89]  DISCHARGE DIAGNOSIS:  Active Problems:   Chest pain   SECONDARY DIAGNOSIS:   Past Medical History:  Diagnosis Date  . Cancer ([place])    vulvular  . Cervical disc disease    a. 01/2018 MRI Cervical spine: Cervical spondylosis with multilevel disc and facet degeneration greatest at C5-6 and C6-7.  Moderate to sev R C5-6, mod L C5-6, and mod bilat C6-7 foraminal stenosis with multilevel mild foraminal stenosis.  Mild C5-6 and mod C6-7 canal stenosis. C6-7 central cord impingement w/ cord flattening.  . Chronic combined systolic (congestive) and diastolic (congestive) heart failure ([place])    a. 12/2017 Echo: EF 30-35%, mid-apicalanteroseptal, ant, and apical AK. Gr1 DD. Mild conc LVH; b. 03/2018 Echo: EF 45-50%, antsept, ant HK. Mild MR. Nl LA size. Nl RV fxn.  Marland Kitchen COPD (chronic obstructive pulmonary disease) (HCC)    not on home oxygen  . Coronary artery disease    a. 12/2017 ACS/PCI: LAD 100p (2.25x26 Resolute Onyx DES), 66m (2.0x12 Resolute Onyx DES), 80d (2.0x15 Resolute Onyx DES), RCA 90p (non-dominant). EF 25-35%. Post-MI course complicated by CGS; b. 02/7025 NSTEMI/subacute thrombosis-->LAD 100 (PTCA + DES x 1); c. 01/2018 NSTEMI/PCI: LM min irregs, LAD 50-60p ISR/hazy (3.5x12 Resolute DES), 4m/d (underexpansion of prior stents-->PTCA). EF 45-50%.  . Diabetes mellitus without complication ([place])   . GERD (gastroesophageal reflux disease)   . H/O degenerative disc disease   . Hypotension   . Ischemic cardiomyopathy    a. 12/2017 Echo: EF 30-35%; b. 03/2018 Echo: EF 45-50%.  . Myocardial infarction ([place])    a. 12/2017-->DES to LAD x 3.  .  Pancreatitis   . Shingles   . Spinal stenosis   . Tobacco abuse   . Ulcer (traumatic) of oral mucosa   . Urinary incontinence     HOSPITAL COURSE:  Renee Mack  is a 52 y.o. female with a known history of coronary artery disease recent history of acute MI in July treated with PCI and 3 drug-eluting stents followed by stent thrombosis and restenosis requiring repetition of the procedure, combined diastolic and systolic congestive heart failure with ejection fraction 45 to 50%, ischemic cardiomyopathy, GERD, hyperlipidemia and diabetes mellitus presenting to the ED with a chief complaint of sharp crushing chest pain in the middle of the chest associated with shortness of breath.  Pain is 8 out of 10 during my examination troponin less than 0.03 EKG normal sinus rhythm.  Hospitalist team is called to admit the patient.  Patient is still complaining of 8 out of 10 chest pain during my examination.    Chest pain with history of acute MI, drug-eluting stent placement followed by stent thrombosis and restenosis requiring repeated procedure AMI ruled out neg troponins CTA chest neg PE  Cardiology consult placed-CMHGis following  Had cardiac cath  06/15/2018-patent LAD stent, no other recommendations from cardiology Monitored  patient on telemetry Ranexa dose increased to thousand milligrams twice a day Continue aspirin 81 mg, Lipitor, Toprol, Brilinta   #URI with bronchitis Negative respiratory panel, influenza IV Rocephin given and will discharge patient with p.o. Omnicef for 3 more days Cough meds as needed  #  Chronic history of spinal stenosis with neck pain outpatient follow-up with neurosurgery Will discharge with Flexeril 10 tablets and oxycodone 5 mg 10 tablets to take as needed  #Mild proximal abdominal aortic thrombus Seen by vascular surgery not recommending any heparin drip or other interventions, as there is no embolization Discontinue heparin drip  #Chronic combined  systolic and diastolic congestive heart failure not fluid overloaded at this time Aspirin, Lipitor, Toprol will be continued not on any diuretics  #Diabetes mellitus insulin requiring Diabetic diet, sliding scale insulin and Lantus 50 units every 12 hours (Patient who is taking 60 units every 12 hours) Diabetic coordinator recommendations appreciated  #Hyperlipidemia statin  #COPD no exacerbation provide breathing treatments as needed Patient reported that she quit smoking  DVT prophylaxis  DISCHARGE CONDITIONS:   STABLE   CONSULTS OBTAINED:  Treatment Team:  Wellington Hampshire, MD Evaristo Bury, MD   PROCEDURES  CARDIAC CATH 06/16/18  DRUG ALLERGIES:   Allergies  Allergen Reactions  . Bee Venom Itching, Shortness Of Breath and Swelling  . Metformin And Related Shortness Of Breath  . Darvon [Propoxyphene] Itching  . Gabapentin Swelling  . Nsaids Other (See Comments)    Ulcers   . Tramadol Hives  . Contrast Media [Iodinated Diagnostic Agents] Rash    If you benadryl, and steroids she is able to take the contrast per pt    DISCHARGE MEDICATIONS:   Allergies as of 06/16/2018      Reactions   Bee Venom Itching, Shortness Of Breath, Swelling   Metformin And Related Shortness Of Breath   Darvon [propoxyphene] Itching   Gabapentin Swelling   Nsaids Other (See Comments)   Ulcers   Tramadol Hives   Contrast Media [iodinated Diagnostic Agents] Rash   If you benadryl, and steroids she is able to take the contrast per pt      Medication List    STOP taking these medications   terconazole 0.8 % vaginal cream Commonly known as:  TERAZOL 3     TAKE these medications   acetaminophen 325 MG tablet Commonly known as:  TYLENOL Take 650 mg by mouth every 6 (six) hours as needed for mild pain.   aspirin 81 MG tablet Take 81 mg by mouth daily.   atorvastatin 80 MG tablet Commonly known as:  LIPITOR Take 1 tablet (80 mg total) by mouth daily at 6 PM.    cefdinir 300 MG capsule Commonly known as:  OMNICEF Take 1 capsule (300 mg total) by mouth every 12 (twelve) hours.   citalopram 20 MG tablet Commonly known as:  CELEXA Take 1 tablet (20 mg total) by mouth daily.   cyclobenzaprine 5 MG tablet Commonly known as:  FLEXERIL Take 1 tablet (5 mg total) by mouth 3 (three) times daily as needed for muscle spasms.   esomeprazole 40 MG capsule Commonly known as:  NEXIUM Take 40 mg by mouth daily.   insulin aspart 100 UNIT/ML injection Commonly known as:  novoLOG Inject 25 Units into the skin 3 (three) times daily before meals. Take 10 units PLUS sliding scale What changed:  how much to take   insulin glargine 100 UNIT/ML injection Commonly known as:  LANTUS Inject 0.55 mLs (55 Units total) into the skin 2 (two) times daily. What changed:    how much to take  when to take this   lidocaine 2 % jelly Commonly known as:  XYLOCAINE Apply 1 application topically as needed.   LORazepam 1 MG tablet Commonly known  as:  ATIVAN Take 1 tablet (1 mg total) by mouth 2 (two) times daily as needed for anxiety.   metoprolol succinate 25 MG 24 hr tablet Commonly known as:  TOPROL-XL Take 1 tablet (25 mg total) by mouth daily.   nitroGLYCERIN 0.4 MG SL tablet Commonly known as:  NITROSTAT Place 1 tablet (0.4 mg total) under the tongue every 5 (five) minutes as needed for chest pain.   ondansetron 4 MG tablet Commonly known as:  ZOFRAN Take 1 tablet (4 mg total) by mouth daily as needed.   oxyCODONE 5 MG immediate release tablet Commonly known as:  Oxy IR/ROXICODONE Take 1 tablet (5 mg total) by mouth every 6 (six) hours as needed for severe pain or breakthrough pain.   PROAIR HFA 108 (90 Base) MCG/ACT inhaler Generic drug:  albuterol Inhale 1 puff into the lungs every 6 (six) hours as needed for wheezing or shortness of breath. What changed:  Another medication with the same name was added. Make sure you understand how and when to take  each.   albuterol 108 (90 Base) MCG/ACT inhaler Commonly known as:  PROVENTIL HFA;VENTOLIN HFA Inhale 2 puffs into the lungs every 6 (six) hours as needed for wheezing or shortness of breath. What changed:  You were already taking a medication with the same name, and this prescription was added. Make sure you understand how and when to take each.   promethazine 25 MG tablet Commonly known as:  PHENERGAN Take 25 mg by mouth every 6 (six) hours as needed for nausea. for nausea   ranolazine 1000 MG SR tablet Commonly known as:  RANEXA Take 1 tablet (1,000 mg total) by mouth 2 (two) times daily. What changed:    medication strength  how much to take   ticagrelor 90 MG Tabs tablet Commonly known as:  BRILINTA Take 90 mg by mouth 2 (two) times daily.        DISCHARGE INSTRUCTIONS:  Follow-up with primary care physician in 3 to 4 days Follow-up with cardiology Dr. Fletcher Anon in 1 week   DIET:  Cardiac diet and Diabetic diet  DISCHARGE CONDITION:  Fair  ACTIVITY:  Activity as tolerated  OXYGEN:  Home Oxygen: No.   Oxygen Delivery: room air  DISCHARGE LOCATION:  home   If you experience worsening of your admission symptoms, develop shortness of breath, life threatening emergency, suicidal or homicidal thoughts you must seek medical attention immediately by calling 911 or calling your MD immediately  if symptoms less severe.  You Must read complete instructions/literature along with all the possible adverse reactions/side effects for all the Medicines you take and that have been prescribed to you. Take any new Medicines after you have completely understood and accpet all the possible adverse reactions/side effects.   Please note  You were cared for by a hospitalist during your hospital stay. If you have any questions about your discharge medications or the care you received while you were in the hospital after you are discharged, you can call the unit and asked to speak with  the hospitalist on call if the hospitalist that took care of you is not available. Once you are discharged, your primary care physician will handle any further medical issues. Please note that NO REFILLS for any discharge medications will be authorized once you are discharged, as it is imperative that you return to your primary care physician (or establish a relationship with a primary care physician if you do not have one) for your aftercare  needs so that they can reassess your need for medications and monitor your lab values.     Today  Chief Complaint  Patient presents with  . Chest Pain   Patient is still having chest pain which is better than yesterday.  Reports chronic cervical stenosis and neck pain which might be radiating to her chest.  Patient is going to see neurosurgery soon.  Okay to discharge patient from cardiology standpoint as cardiac cath has revealed a patent LAD stent.  Flu test is negative  ROS:  CONSTITUTIONAL: Denies fevers, chills. Denies any fatigue, weakness.  EYES: Denies blurry vision, double vision, eye pain. EARS, NOSE, THROAT: Denies tinnitus, ear pain, hearing loss. RESPIRATORY: Denies cough, wheeze, shortness of breath.  CARDIOVASCULAR: Denies chest pain, palpitations, edema.  GASTROINTESTINAL: Denies nausea, vomiting, diarrhea, abdominal pain. Denies bright red blood per rectum. GENITOURINARY: Denies dysuria, hematuria. ENDOCRINE: Denies nocturia or thyroid problems. HEMATOLOGIC AND LYMPHATIC: Denies easy bruising or bleeding. SKIN: Denies rash or lesion. MUSCULOSKELETAL: Denies pain in neck, back, shoulder, knees, hips or arthritic symptoms.  NEUROLOGIC: Denies paralysis, paresthesias.  PSYCHIATRIC: Denies anxiety or depressive symptoms.   VITAL SIGNS:  Blood pressure 132/70, pulse 68, temperature 99.5 F (37.5 C), temperature source Oral, resp. rate 18, height 5\' 1"  (1.549 m), weight 60.6 kg, SpO2 98 %.  I/O:    Intake/Output Summary (Last 24  hours) at 06/16/2018 1132 Last data filed at 06/16/2018 0700 Gross per 24 hour  Intake 480 ml  Output 1500 ml  Net -1020 ml    PHYSICAL EXAMINATION:  GENERAL:  52 y.o.-year-old patient lying in the bed with no acute distress.  EYES: Pupils equal, round, reactive to light and accommodation. No scleral icterus. Extraocular muscles intact.  HEENT: Head atraumatic, normocephalic. Oropharynx and nasopharynx clear.  NECK:  Supple, no jugular venous distention. No thyroid enlargement, no tenderness.  LUNGS: Normal breath sounds bilaterally, no wheezing, rales,rhonchi or crepitation. No use of accessory muscles of respiration.  CARDIOVASCULAR: S1, S2 normal. No murmurs, rubs, or gallops.  ABDOMEN: Soft, non-tender, non-distended. Bowel sounds present. No organomegaly or mass.  EXTREMITIES: No pedal edema, cyanosis, or clubbing.  NEUROLOGIC: Awake, alert and oriented x3. Sensation intact. Gait not checked.  PSYCHIATRIC: The patient is alert and oriented x 3.  SKIN: No obvious rash, lesion, or ulcer.   DATA REVIEW:   CBC Recent Labs  Lab 06/16/18 0519  WBC 15.8*  HGB 13.7  HCT 42.3  PLT 241    Chemistries  Recent Labs  Lab 06/16/18 0519  NA 135  K 4.1  CL 105  CO2 23  GLUCOSE 266*  BUN 23*  CREATININE 0.55  CALCIUM 8.9    Cardiac Enzymes Recent Labs  Lab 06/13/18 1643  [place] <0.03    Microbiology Results  Results for orders placed or performed during the hospital encounter of 06/13/18  Respiratory Panel by PCR     Status: None   Collection Time: 06/15/18  5:04 PM  Result Value Ref Range Status   Adenovirus NOT DETECTED NOT DETECTED Final   Coronavirus 229E NOT DETECTED NOT DETECTED Final   Coronavirus HKU1 NOT DETECTED NOT DETECTED Final   Coronavirus NL63 NOT DETECTED NOT DETECTED Final   Coronavirus OC43 NOT DETECTED NOT DETECTED Final   Metapneumovirus NOT DETECTED NOT DETECTED Final   Rhinovirus / Enterovirus NOT DETECTED NOT DETECTED Final   Influenza  A NOT DETECTED NOT DETECTED Final   Influenza B NOT DETECTED NOT DETECTED Final   Parainfluenza Virus 1 NOT  DETECTED NOT DETECTED Final   Parainfluenza Virus 2 NOT DETECTED NOT DETECTED Final   Parainfluenza Virus 3 NOT DETECTED NOT DETECTED Final   Parainfluenza Virus 4 NOT DETECTED NOT DETECTED Final   Respiratory Syncytial Virus NOT DETECTED NOT DETECTED Final   Bordetella pertussis NOT DETECTED NOT DETECTED Final   Chlamydophila pneumoniae NOT DETECTED NOT DETECTED Final   Mycoplasma pneumoniae NOT DETECTED NOT DETECTED Final    Comment: Performed at [facility], [place] [address], [place], [place] [address]    RADIOLOGY:  Dg Chest 2 View  Result Date: 06/13/2018 CLINICAL DATA:  Chest pain and shortness of breath EXAM: CHEST - 2 VIEW COMPARISON:  May 28, 2018 FINDINGS: There is slight bibasilar atelectasis. No edema or consolidation. Heart size and pulmonary vascularity are normal. No adenopathy. There are coronary stents present. There is aortic atherosclerosis. No evident bone lesions. IMPRESSION: Aortic atherosclerosis. Coronary stents present. Slight bibasilar atelectasis. No edema or consolidation. Stable cardiac silhouette. Aortic Atherosclerosis (ICD10-I70.0). Electronically Signed   By: Lowella Grip III M.D.   On: 06/13/2018 08:20   Ct Angio Chest Pe W Or Wo Contrast  Result Date: 06/15/2018 CLINICAL DATA:  Acute onset of generalized pleuritic chest pain. EXAM: CT ANGIOGRAPHY CHEST WITH CONTRAST TECHNIQUE: Multidetector CT imaging of the chest was performed using the standard protocol during bolus administration of intravenous contrast. Multiplanar CT image reconstructions and MIPs were obtained to evaluate the vascular anatomy. CONTRAST:  58mL ISOVUE-370 IOPAMIDOL (ISOVUE-370) INJECTION 76% COMPARISON:  Chest radiograph performed 06/13/2018, and CTA of the chest performed 01/26/2018 FINDINGS: Cardiovascular:  There is no evidence of pulmonary embolus. The heart is  normal in size. Coronary artery calcification is noted. There is mild calcification at the aortic arch, and mild mural thrombus at the proximal left subclavian artery, without significant luminal narrowing. Mediastinum/Nodes: The mediastinum is otherwise unremarkable. No mediastinal lymphadenopathy is seen. A calcified left hilar node may reflect remote granulomatous disease. No pericardial effusion is identified. The thyroid gland is unremarkable. No axillary lymphadenopathy is appreciated. Lungs/Pleura: Mild bibasilar atelectasis is noted. The lungs and otherwise clear. No pleural effusion or pneumothorax is seen. No masses are identified. Upper Abdomen: The visualized portions of the liver and spleen are unremarkable. Scattered calcification is seen along the proximal abdominal aorta, with mild slightly irregular mural thrombus. Musculoskeletal: No acute osseous abnormalities are identified. The visualized musculature is unremarkable in appearance. Review of the MIP images confirms the above findings. IMPRESSION: 1. No evidence of pulmonary embolus. 2. Mild bibasilar atelectasis noted. Lungs otherwise clear. 3. Coronary artery calcification noted. 4. Scattered calcification along the proximal abdominal aorta, with mild slightly irregular mural thrombus. Aortic Atherosclerosis (ICD10-I70.0). Electronically Signed   By: Garald Balding M.D.   On: 06/15/2018 00:08    EKG:   Orders placed or performed during the hospital encounter of 06/13/18  . ED EKG within 10 minutes  . ED EKG within 10 minutes      Management plans discussed with the patient, family and they are in agreement.  CODE STATUS:     Code Status Orders  (From admission, onward)         Start     Ordered   06/13/18 1050  Full code  Continuous     06/13/18 1050        Code Status History    Date Active Date Inactive Code Status Order ID Comments User Context   05/28/2018 1341 05/29/2018 1751 Full Code [id number]  [place], Pete Pelt,  MD Inpatient   05/22/2018 0607 05/23/2018 1837 Full Code [id number]  Harrie Foreman, MD ED   03/24/2018 1305 03/25/2018 1821 Full Code [id number]  Hillary Bow, MD ED   02/07/2018 0630 02/09/2018 1532 Full Code [id number]  Arta Silence, MD ED   01/26/2018 0811 01/28/2018 1803 Full Code [id number]  Harrie Foreman, MD Inpatient   01/15/2018 1342 01/17/2018 1617 Full Code [id number]  Demetrios Loll, MD Inpatient   12/26/2017 1538 12/30/2017 1928 Full Code [id number]  Wellington Hampshire, MD Inpatient   12/26/2017 1537 12/26/2017 1538 Full Code [id number]  Vaughan Basta, MD Inpatient   12/19/2017 0839 12/21/2017 1940 Full Code [id number]  Wellington Hampshire, MD Inpatient   11/03/2017 1031 11/04/2017 1850 Full Code [id number]  Fritzi Mandes, MD Inpatient   09/28/2017 1044 09/29/2017 2121 Full Code [id number]  Harrie Foreman, MD ED   07/17/2017 2004 07/20/2017 1840 Full Code [id number]  Henreitta Leber, MD ED   06/12/2017 2227 06/16/2017 1910 Full Code [id number]  Gorden Harms, MD Inpatient   12/28/2014 0305 12/28/2014 1919 Full Code [id number]  Lance Coon, MD Inpatient      TOTAL TIME TAKING CARE OF THIS PATIENT: 43  minutes.   Note: This dictation was prepared with Dragon dictation along with smaller phrase technology. Any transcriptional errors that result from this process are unintentional.   @[facility] @  on 06/16/2018 at 11:32 AM  Between 7am to 6pm - Pager - [phone]  After 6pm go to [email or web address] - password [email or web address]  Office  [phone]  CC: Primary care physician; Olin Hauser, DO

## 2018-06-18 ENCOUNTER — Encounter: Payer: Self-pay | Admitting: Cardiovascular Disease

## 2018-06-19 ENCOUNTER — Telehealth: Payer: Self-pay

## 2018-06-19 NOTE — Telephone Encounter (Signed)
Attempted to reach patient for TCM call and confirm hosptial f/u appt on 06/22/18 @ 10:40 am with Dr. Parks Ranger. Unable to leave message due to mailbox full.

## 2018-06-22 ENCOUNTER — Inpatient Hospital Stay: Payer: Medicaid Other | Admitting: Family Medicine

## 2018-06-26 ENCOUNTER — Other Ambulatory Visit: Payer: Self-pay

## 2018-06-26 ENCOUNTER — Emergency Department: Payer: Medicaid Other

## 2018-06-26 ENCOUNTER — Emergency Department
Admission: EM | Admit: 2018-06-26 | Discharge: 2018-06-26 | Disposition: A | Discharge status: Home / Self Care | Payer: Medicaid Other | Attending: Emergency Medicine | Admitting: Emergency Medicine

## 2018-06-26 ENCOUNTER — Encounter: Payer: Self-pay | Admitting: Emergency Medicine

## 2018-06-26 DIAGNOSIS — F329 Major depressive disorder, single episode, unspecified: Secondary | ICD-10-CM | POA: Diagnosis not present

## 2018-06-26 DIAGNOSIS — E119 Type 2 diabetes mellitus without complications: Secondary | ICD-10-CM | POA: Diagnosis not present

## 2018-06-26 DIAGNOSIS — I252 Old myocardial infarction: Secondary | ICD-10-CM | POA: Diagnosis not present

## 2018-06-26 DIAGNOSIS — Z8544 Personal history of malignant neoplasm of other female genital organs: Secondary | ICD-10-CM | POA: Insufficient documentation

## 2018-06-26 DIAGNOSIS — I5022 Chronic systolic (congestive) heart failure: Secondary | ICD-10-CM | POA: Insufficient documentation

## 2018-06-26 DIAGNOSIS — J449 Chronic obstructive pulmonary disease, unspecified: Secondary | ICD-10-CM | POA: Diagnosis not present

## 2018-06-26 DIAGNOSIS — Z7982 Long term (current) use of aspirin: Secondary | ICD-10-CM | POA: Insufficient documentation

## 2018-06-26 DIAGNOSIS — F419 Anxiety disorder, unspecified: Secondary | ICD-10-CM | POA: Insufficient documentation

## 2018-06-26 DIAGNOSIS — K529 Noninfective gastroenteritis and colitis, unspecified: Secondary | ICD-10-CM | POA: Diagnosis not present

## 2018-06-26 DIAGNOSIS — I2510 Atherosclerotic heart disease of native coronary artery without angina pectoris: Secondary | ICD-10-CM | POA: Diagnosis not present

## 2018-06-26 DIAGNOSIS — Z794 Long term (current) use of insulin: Secondary | ICD-10-CM | POA: Insufficient documentation

## 2018-06-26 DIAGNOSIS — Z79899 Other long term (current) drug therapy: Secondary | ICD-10-CM | POA: Diagnosis not present

## 2018-06-26 DIAGNOSIS — R112 Nausea with vomiting, unspecified: Secondary | ICD-10-CM | POA: Diagnosis present

## 2018-06-26 DIAGNOSIS — Z87891 Personal history of nicotine dependence: Secondary | ICD-10-CM | POA: Diagnosis not present

## 2018-06-26 LAB — CBC
HCT: 47.6 % — ABNORMAL HIGH (ref 36.0–46.0)
Hemoglobin: 15.8 g/dL — ABNORMAL HIGH (ref 12.0–15.0)
MCH: 31.2 pg (ref 26.0–34.0)
MCHC: 33.2 g/dL (ref 30.0–36.0)
MCV: 94.1 fL (ref 80.0–100.0)
Platelets: 339 10*3/uL (ref 150–400)
RBC: 5.06 MIL/uL (ref 3.87–5.11)
RDW: 13.2 % (ref 11.5–15.5)
WBC: 13.2 10*3/uL — AB (ref 4.0–10.5)
nRBC: 0 % (ref 0.0–0.2)

## 2018-06-26 LAB — BASIC METABOLIC PANEL
Anion gap: 9 (ref 5–15)
BUN: 15 mg/dL (ref 6–20)
CO2: 23 mmol/L (ref 22–32)
Calcium: 9.3 mg/dL (ref 8.9–10.3)
Chloride: 103 mmol/L (ref 98–111)
Creatinine, Ser: 0.65 mg/dL (ref 0.44–1.00)
Glucose, Bld: 334 mg/dL — ABNORMAL HIGH (ref 70–99)
Potassium: 4.5 mmol/L (ref 3.5–5.1)
Sodium: 135 mmol/L (ref 135–145)

## 2018-06-26 LAB — URINALYSIS, COMPLETE (UACMP) WITH MICROSCOPIC
Bacteria, UA: NONE SEEN
Bilirubin Urine: NEGATIVE
Glucose, UA: >=500 mg/dL — AB
Hgb urine dipstick: NEGATIVE
KETONES UR: 20 mg/dL — AB
NITRITE: NEGATIVE
Protein, ur: NEGATIVE mg/dL
Specific Gravity, Urine: 1.033 — ABNORMAL HIGH (ref 1.005–1.030)
pH: 5 (ref 5.0–8.0)

## 2018-06-26 LAB — GLUCOSE, CAPILLARY: Glucose-Capillary: 309 mg/dL — ABNORMAL HIGH (ref 70–99)

## 2018-06-26 MED ORDER — MORPHINE SULFATE (PF) 2 MG/ML IV SOLN
INTRAVENOUS | Status: AC
  Filled 2018-06-26: qty 1

## 2018-06-26 MED ORDER — SODIUM CHLORIDE 0.9 % IV SOLN
1000.0000 mL | Freq: Once | INTRAVENOUS | Status: AC
  Administered 2018-06-26: 1000 mL via INTRAVENOUS

## 2018-06-26 MED ORDER — ONDANSETRON HCL 4 MG/2ML IJ SOLN
4.0000 mg | Freq: Once | INTRAMUSCULAR | Status: AC
  Administered 2018-06-26: 4 mg via INTRAVENOUS
  Filled 2018-06-26: qty 2

## 2018-06-26 MED ORDER — ONDANSETRON 4 MG PO TBDP: 4.0000 mg | ORAL_TABLET | Freq: Three times a day (TID) | ORAL | 0 refills | Status: DC | PRN

## 2018-06-26 MED ORDER — MORPHINE SULFATE (PF) 2 MG/ML IV SOLN
2.0000 mg | Freq: Once | INTRAVENOUS | Status: AC
  Administered 2018-06-26: 2 mg via INTRAVENOUS
  Filled 2018-06-26: qty 1

## 2018-06-26 MED ORDER — DICYCLOMINE HCL 20 MG PO TABS: 20.0000 mg | ORAL_TABLET | Freq: Three times a day (TID) | ORAL | 0 refills | Status: DC | PRN

## 2018-06-26 MED ORDER — MORPHINE SULFATE (PF) 2 MG/ML IV SOLN
2.0000 mg | Freq: Once | INTRAVENOUS | Status: AC
  Administered 2018-06-26: 2 mg via INTRAVENOUS

## 2018-06-26 NOTE — Progress Notes (Deleted)
Cardiology Office Note Date:  06/26/2018  Patient ID:  Renee Mack, Renee Mack [birth year], MRN [id number] PCP:  Olin Hauser, DO  Cardiologist:  Dr. Fletcher Anon, MD  ***refresh   Chief Complaint: Hospital follow-up  History of Present Illness: Renee Mack is a 53 y.o. female with history of CAD s/pMI and drug-eluting stent placement to LAD x3 in7/2019,with subsequent readmission for subacute stent thrombosis and repeat PCI x3 s/p PTCA most recently in 26/8341, combined systolic and diastolic congestive heart failure, ischemic cardiomyopathy with EF of 45 to 50%, former tobacco abuse, COPD, hyperlipidemia, GERD,poorly controlledtype 2 diabetes mellitus, and pancreatitis who presents for hospital follow-up after recent admission to [facility] from 12/24 through 12/27 for recurrent chest pain.  Ms. Renee Mack has had a complicated few months following initial STEMI the first of July for which she underwent cardiac cath that showedLAD 100p, 8m, 80d, RCA 90p (non-dominant).She required three DES to the LAD. Her recovery was complicated by cardiogenic shock and hypotension requiring vasopressors. Following initial MI, EF was reduced to 30-35% and was provided a LifeVest at discharge. This was followed by two separate admissions for chest, neck, arm, and back pain with associated nausea and vomiting. She underwent a cardiac cath, once in July and again in August. Each requiring PCI and stent placement related to ISR of the LAD. For recurrent pain, it was recommended to consider PCI of the nondominant RCA with possible hemodynamic assessment of the coronary-pulmonary artery fisulae to exclude coronary steal phenomenon.Repeat echo showed continued improvement, with latest echo showing an EF of 45-50% in October. Her LifeVest was able to be discontinued at that time. She has had issues with neck, arm and back pain related to cervical disc disease that was found on MRI in August and was admitted again in October  for the same pain. Her cardiac work up was negative at that time, her pain was reproducible with movementand attributed to cervical disc disease. She has been noted to have relative hypotension which has precluded the addition of long-acting nitrate therapy.   She was admitted to the hospital on 12/2 with sudden onset of severe chest pain rated 8 out of 10 that began around 2 to 2:30 in the morning with associated shortness of breath, diaphoresis, nausea, and one episode of emesis. EKG showed nonspecific ST-T changes. Cardiac enzymes remain negative. Chest x-ray showed no acute disease. She underwent repeat cardiac cath on 05/22/2018 that showed significant underlying three-vessel CAD with patent LAD stents except in the mid to distal segment where there was 95% stenosis that appeared to be a combination of restenosis and possible localized thrombus. She was noted to have stable proximal RCA disease in a nondominant vessel. 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 She underwent successful PTCA to the mid LAD with noncompliant balloon high pressure. She was noted to have normal LVEDP. She was readmitted to the hospital from 12/8-12/9 with intractable nausea and vomiting and was treated for hyperglycemia in the setting of poorly controlled diabetes with blood glucose greater than 500. Cardiac enzyme was negative.  In hospital follow up on 12/19 she was doing well and denied any further chest pain or SOB.  She was compliant with medications.   She was most recently admitted to the hospital on 12/24 with intermittent chest pain that was exacerbated by coughing and similar to prior episodes.  She reported compliance with all medications.  There was associated neck and shoulder pain.  EKG was unchanged from prior studies.  Cardiac enzymes remained negative.  Chest x-ray showed aortic atherosclerosis with slight bibasilar atelectasis without  edema or consolidation.  Glucose was poorly controlled in the 300s.  CTA of the chest was negative for PE.  She continued to note 8 out of 10 chest pain and ultimately underwent repeat cardiac cath on 06/15/2018 which showed a widely patent LAD stent with no significant restenosis.  Stable 90% stenosis in the nondominant small RCA not different than previous.  There was also a small OM 2 which was subtotally occluded proximally which was unchanged from recent cardiac cath in August.  Mildly reduced LV SF with an EF of 45 to 50% with anterior wall hypokinesis.  Normal LVEDP.  Continuation of medical therapy was recommended.  Her Ranexa was titrated to 1000 mg twice daily.  Of note, there was question during this admission as to if the patient was not smoking and her hospital room.  ***  Past Medical History:  Diagnosis Date  . Cancer ([place])    vulvular  . Cervical disc disease    a. 01/2018 MRI Cervical spine: Cervical spondylosis with multilevel disc and facet degeneration greatest at C5-6 and C6-7.  Moderate to sev R C5-6, mod L C5-6, and mod bilat C6-7 foraminal stenosis with multilevel mild foraminal stenosis.  Mild C5-6 and mod C6-7 canal stenosis. C6-7 central cord impingement w/ cord flattening.  . Chronic combined systolic (congestive) and diastolic (congestive) heart failure ([place])    a. 12/2017 Echo: EF 30-35%, mid-apicalanteroseptal, ant, and apical AK. Gr1 DD. Mild conc LVH; b. 03/2018 Echo: EF 45-50%, antsept, ant HK. Mild MR. Nl LA size. Nl RV fxn.  Marland Kitchen COPD (chronic obstructive pulmonary disease) (H[facility])    not on home oxygen  . Coronary artery disease    a. 12/2017 ACS/PCI: LAD 100p (2.25x26 Resolute Onyx DES), 58m (2.0x12 Resolute Onyx DES), 80d (2.0x15 Resolute Onyx DES), RCA 90p (non-dominant). EF 25-35%. Post-MI course complicated by CGS; b. 10/3612 NSTEMI/subacute thrombosis-->LAD 100 (PTCA + DES x 1); c. 01/2018 NSTEMI/PCI: LM min irregs, LAD 50-60p ISR/hazy (3.5x12 Resolute DES), 58m/d  (underexpansion of prior stents-->PTCA). EF 45-50%.  . Diabetes mellitus without complication ([place])   . GERD (gastroesophageal reflux disease)   . H/O degenerative disc disease   . Hypotension   . Ischemic cardiomyopathy    a. 12/2017 Echo: EF 30-35%; b. 03/2018 Echo: EF 45-50%.  . Myocardial infarction ([place])    a. 12/2017-->DES to LAD x 3.  . Pancreatitis   . Shingles   . Spinal stenosis   . Tobacco abuse   . Ulcer (traumatic) of oral mucosa   . Urinary incontinence     Past Surgical History:  Procedure Laterality Date  . ABDOMINAL HYSTERECTOMY     partial  . CORONARY BALLOON ANGIOPLASTY N/A 05/22/2018   Procedure: CORONARY BALLOON ANGIOPLASTY;  Surgeon: Wellington Hampshire, MD;  Location: [place] [facility];  Service: Cardiovascular;  Laterality: N/A;  . CORONARY STENT INTERVENTION N/A 12/26/2017   Procedure: CORONARY STENT INTERVENTION;  Surgeon: Wellington Hampshire, MD;  Location: [place] [facility];  Service: Cardiovascular;  Laterality: N/A;  . CORONARY STENT INTERVENTION N/A 02/08/2018   Procedure: CORONARY STENT INTERVENTION;  Surgeon: Nelva Bush, MD;  Location: [place] [facility];  Service: Cardiovascular;  Laterality: N/A;  . CORONARY/GRAFT ACUTE MI REVASCULARIZATION N/A 12/19/2017   Procedure: Coronary/Graft Acute MI Revascularization;  Surgeon: Wellington Hampshire, MD;  Location: [place] [facility];  Service: Cardiovascular;  Laterality: N/A;  . ECTOPIC PREGNANCY SURGERY Left   . INTRAVASCULAR ULTRASOUND/IVUS N/A 02/08/2018   Procedure: Intravascular Ultrasound/IVUS;  Surgeon: Nelva Bush, MD;  Location: [place] [facility];  Service: Cardiovascular;  Laterality: N/A;  . LEFT HEART CATH AND CORONARY ANGIOGRAPHY N/A 12/19/2017   Procedure: LEFT HEART CATH AND CORONARY ANGIOGRAPHY;  Surgeon: Wellington Hampshire, MD;  Location: [place] [facility];  Service: Cardiovascular;  Laterality: N/A;  . LEFT HEART CATH AND CORONARY ANGIOGRAPHY N/A 12/26/2017   Procedure: LEFT HEART  CATH AND CORONARY ANGIOGRAPHY;  Surgeon: Wellington Hampshire, MD;  Location: [place] [facility];  Service: Cardiovascular;  Laterality: N/A;  . LEFT HEART CATH AND CORONARY ANGIOGRAPHY N/A 02/08/2018   Procedure: LEFT HEART CATH AND CORONARY ANGIOGRAPHY;  Surgeon: Nelva Bush, MD;  Location: [place] [facility];  Service: Cardiovascular;  Laterality: N/A;  . LEFT HEART CATH AND CORONARY ANGIOGRAPHY N/A 05/22/2018   Procedure: LEFT HEART CATH AND CORONARY ANGIOGRAPHY;  Surgeon: Wellington Hampshire, MD;  Location: [place] [facility];  Service: Cardiovascular;  Laterality: N/A;  . LEFT HEART CATH AND CORONARY ANGIOGRAPHY N/A 06/15/2018   Procedure: LEFT HEART CATH AND CORONARY ANGIOGRAPHY;  Surgeon: Wellington Hampshire, MD;  Location: [place] [facility];  Service: Cardiovascular;  Laterality: N/A;  . LYMPHADENECTOMY    . SPLENECTOMY, PARTIAL    . vulvulasectomy      No outpatient medications have been marked as taking for the 06/27/18 encounter (Appointment) with Rise Mu, PA-C.    Allergies:   Bee venom; Metformin and related; Darvon [propoxyphene]; Gabapentin; Nsaids; Tramadol; and Contrast media [iodinated diagnostic agents]   Social History:  The patient  reports that she quit smoking about 6 months ago. Her smoking use included cigarettes. She has a 15.00 pack-year smoking history. She has never used smokeless tobacco. She reports that she does not drink alcohol or use drugs.   Family History:  The patient's family history includes Heart attack in her brother and father; Heart disease in her brother; Lupus in her sister; Non-Hodgkin's lymphoma in her mother; Osteoporosis in her mother.  ROS:   ROS   PHYSICAL EXAM: *** VS:  There were no vitals taken for this visit. BMI: There is no height or weight on file to calculate BMI.  Physical Exam   EKG:  Was ordered and interpreted by me today. Shows ***  Recent Labs: 03/24/2018: B Natriuretic Peptide 91.0; Magnesium 1.7 05/22/2018:  ALT 16; TSH 1.447 06/16/2018: BUN 23; Creatinine, Ser 0.55; Hemoglobin 13.7; Platelets 241; Potassium 4.1; Sodium 135  03/06/2018: Cholesterol 129; HDL 26; LDL Cholesterol 60; Total CHOL/HDL Ratio 5.0; Triglycerides 213; VLDL 43   Estimated Creatinine Clearance: 67.1 mL/min (by C-G formula based on SCr of 0.55 mg/dL).   Wt Readings from Last 3 Encounters:  06/16/18 133 lb 11.2 oz (60.6 kg)  06/08/18 136 lb (61.7 kg)  06/07/18 135 lb 3.2 oz (61.3 kg)     Other studies reviewed: Additional studies/records reviewed today include: summarized above  ASSESSMENT AND PLAN:  1. ***  Disposition: F/u with Dr. Fletcher Mack or an APP in ***  Current medicines are reviewed at length with the patient today.  The patient did not have any concerns regarding medicines.  Signed, Christell Faith, PA-C 06/26/2018 6:56 AM     [facility] [address] [place] [address] [place], [place] [address] [phone]

## 2018-06-26 NOTE — ED Triage Notes (Signed)
Says vomiting not releived by phenergan since last night.  Says she has history of dka and sugar has been up .  No illnesses, but satys she has left flank pain

## 2018-06-26 NOTE — ED Provider Notes (Signed)
[facility] Emergency Department Provider Note   ____________________________________________    I have reviewed the triage vital signs and the nursing notes.   HISTORY  Chief Complaint Emesis and Hyperglycemia     HPI Renee Mack is a 53 y.o. female who presents with complaints of nausea and vomiting and abdominal cramping.  Patient reports her symptoms started yesterday evening, she reports multiple episodes of vomiting throughout the night, nonbilious, nonbloody.  Denies fevers or chills although does feel fatigued and achy.  Does note diffuse abdominal cramping, intermittent.  No diarrhea.  Took Phenergan with little improvement.  She is concerned because her blood sugar is elevated and she has had diabetic ketoacidosis in the past   Past Medical History:  Diagnosis Date  . Cancer ([place])    vulvular  . Cervical disc disease    a. 01/2018 MRI Cervical spine: Cervical spondylosis with multilevel disc and facet degeneration greatest at C5-6 and C6-7.  Moderate to sev R C5-6, mod L C5-6, and mod bilat C6-7 foraminal stenosis with multilevel mild foraminal stenosis.  Mild C5-6 and mod C6-7 canal stenosis. C6-7 central cord impingement w/ cord flattening.  . Chronic combined systolic (congestive) and diastolic (congestive) heart failure ([place])    a. 12/2017 Echo: EF 30-35%, mid-apicalanteroseptal, ant, and apical AK. Gr1 DD. Mild conc LVH; b. 03/2018 Echo: EF 45-50%, antsept, ant HK. Mild MR. Nl LA size. Nl RV fxn.  Marland Kitchen COPD (chronic obstructive pulmonary disease) (HCC)    not on home oxygen  . Coronary artery disease    a. 12/2017 ACS/PCI: LAD 100p (2.25x26 Resolute Onyx DES), 58m (2.0x12 Resolute Onyx DES), 80d (2.0x15 Resolute Onyx DES), RCA 90p (non-dominant). EF 25-35%. Post-MI course complicated by CGS; b. 01/6760 NSTEMI/subacute thrombosis-->LAD 100 (PTCA + DES x 1); c. 01/2018 NSTEMI/PCI: LM min irregs, LAD 50-60p ISR/hazy (3.5x12 Resolute DES), 28m/d  (underexpansion of prior stents-->PTCA). EF 45-50%.  . Diabetes mellitus without complication ([place])   . GERD (gastroesophageal reflux disease)   . H/O degenerative disc disease   . Hypotension   . Ischemic cardiomyopathy    a. 12/2017 Echo: EF 30-35%; b. 03/2018 Echo: EF 45-50%.  . Myocardial infarction ([place])    a. 12/2017-->DES to LAD x 3.  . Pancreatitis   . Shingles   . Spinal stenosis   . Tobacco abuse   . Ulcer (traumatic) of oral mucosa   . Urinary incontinence     Patient Active Problem List   Diagnosis Date Noted  . Hyperglycemia 05/28/2018  . Vulvar intraepithelial neoplasia (VIN) 05/24/2018  . History of ST elevation myocardial infarction (STEMI) 02/27/2018  . CAD (coronary artery disease) 02/27/2018  . Hyperlipidemia associated with type 2 diabetes mellitus ([place]) 02/27/2018  . Anxiety associated with depression 02/27/2018  . Chronic systolic heart failure ([place])   . Chest pain 12/26/2017  . Unstable angina ([place])   . Ischemic cardiomyopathy   . Former smoker   . Protein-calorie malnutrition, severe 07/18/2017  . Uncontrolled type 2 diabetes with neuropathy ([place]) 12/28/2014  . COPD (chronic obstructive pulmonary disease) ([place]) 12/28/2014  . GERD (gastroesophageal reflux disease) 12/28/2014  . History of shingles 12/28/2014  . Hyperlipidemia LDL goal <70 12/28/2014    Past Surgical History:  Procedure Laterality Date  . ABDOMINAL HYSTERECTOMY     partial  . CORONARY BALLOON ANGIOPLASTY N/A 05/22/2018   Procedure: CORONARY BALLOON ANGIOPLASTY;  Surgeon: Wellington Hampshire, MD;  Location: [place] [facility];  Service: Cardiovascular;  Laterality: N/A;  .  CORONARY STENT INTERVENTION N/A 12/26/2017   Procedure: CORONARY STENT INTERVENTION;  Surgeon: Wellington Hampshire, MD;  Location: [place] [facility];  Service: Cardiovascular;  Laterality: N/A;  . CORONARY STENT INTERVENTION N/A 02/08/2018   Procedure: CORONARY STENT INTERVENTION;  Surgeon: Nelva Bush, MD;   Location: [place] [facility];  Service: Cardiovascular;  Laterality: N/A;  . CORONARY/GRAFT ACUTE MI REVASCULARIZATION N/A 12/19/2017   Procedure: Coronary/Graft Acute MI Revascularization;  Surgeon: Wellington Hampshire, MD;  Location: [place] [facility];  Service: Cardiovascular;  Laterality: N/A;  . ECTOPIC PREGNANCY SURGERY Left   . INTRAVASCULAR ULTRASOUND/IVUS N/A 02/08/2018   Procedure: Intravascular Ultrasound/IVUS;  Surgeon: Nelva Bush, MD;  Location: [place] [facility];  Service: Cardiovascular;  Laterality: N/A;  . LEFT HEART CATH AND CORONARY ANGIOGRAPHY N/A 12/19/2017   Procedure: LEFT HEART CATH AND CORONARY ANGIOGRAPHY;  Surgeon: Wellington Hampshire, MD;  Location: [place] [facility];  Service: Cardiovascular;  Laterality: N/A;  . LEFT HEART CATH AND CORONARY ANGIOGRAPHY N/A 12/26/2017   Procedure: LEFT HEART CATH AND CORONARY ANGIOGRAPHY;  Surgeon: Wellington Hampshire, MD;  Location: [place] [facility];  Service: Cardiovascular;  Laterality: N/A;  . LEFT HEART CATH AND CORONARY ANGIOGRAPHY N/A 02/08/2018   Procedure: LEFT HEART CATH AND CORONARY ANGIOGRAPHY;  Surgeon: Nelva Bush, MD;  Location: [place] [facility];  Service: Cardiovascular;  Laterality: N/A;  . LEFT HEART CATH AND CORONARY ANGIOGRAPHY N/A 05/22/2018   Procedure: LEFT HEART CATH AND CORONARY ANGIOGRAPHY;  Surgeon: Wellington Hampshire, MD;  Location: [place] [facility];  Service: Cardiovascular;  Laterality: N/A;  . LEFT HEART CATH AND CORONARY ANGIOGRAPHY N/A 06/15/2018   Procedure: LEFT HEART CATH AND CORONARY ANGIOGRAPHY;  Surgeon: Wellington Hampshire, MD;  Location: [place] [facility];  Service: Cardiovascular;  Laterality: N/A;  . LYMPHADENECTOMY    . SPLENECTOMY, PARTIAL    . vulvulasectomy      Prior to Admission medications   Medication Sig Start Date End Date Taking? Authorizing Provider  acetaminophen (TYLENOL) 325 MG tablet Take 650 mg by mouth every 6 (six) hours as needed for mild pain.      [provider]  albuterol (PROAIR HFA) 108 (90 Base) MCG/ACT inhaler Inhale 1 puff into the lungs every 6 (six) hours as needed for wheezing or shortness of breath.     [provider]  albuterol (PROVENTIL HFA;VENTOLIN HFA) 108 (90 Base) MCG/ACT inhaler Inhale 2 puffs into the lungs every 6 (six) hours as needed for wheezing or shortness of breath. 06/16/18   Nicholes Mango, MD  aspirin 81 MG tablet Take 81 mg by mouth daily.    [provider]  atorvastatin (LIPITOR) 80 MG tablet Take 1 tablet (80 mg total) by mouth daily at 6 PM. 01/10/18 03/25/19  Theora Gianotti, NP  cefdinir (OMNICEF) 300 MG capsule Take 1 capsule (300 mg total) by mouth every 12 (twelve) hours. 06/16/18   Nicholes Mango, MD  citalopram (CELEXA) 20 MG tablet Take 1 tablet (20 mg total) by mouth daily. 03/29/18   Karamalegos, Devonne Doughty, DO  cyclobenzaprine (FLEXERIL) 5 MG tablet Take 1 tablet (5 mg total) by mouth 3 (three) times daily as needed for muscle spasms. 06/16/18   Nicholes Mango, MD  dicyclomine (BENTYL) 20 MG tablet Take 1 tablet (20 mg total) by mouth 3 (three) times daily as needed for spasms. 06/26/18 06/26/19  Lavonia Drafts, MD  esomeprazole (NEXIUM) 40 MG capsule Take 40 mg by mouth daily.  [provider]  insulin aspart (NOVOLOG) 100 UNIT/ML injection Inject 25 Units into the skin 3 (three) times daily before meals. Take 10 units PLUS sliding scale Patient taking differently: Inject 35 Units into the skin 3 (three) times daily before meals. Take 10 units PLUS sliding scale  05/29/18 06/29/18  Bettey Costa, MD  insulin glargine (LANTUS) 100 UNIT/ML injection Inject 0.55 mLs (55 Units total) into the skin 2 (two) times daily. 06/16/18 07/17/18  Nicholes Mango, MD  lidocaine (XYLOCAINE) 2 % jelly Apply 1 application topically as needed. 05/24/18   Verlon Au, NP  LORazepam (ATIVAN) 1 MG tablet Take 1 tablet (1 mg total) by mouth 2 (two) times daily as needed for anxiety.  05/31/18   Karamalegos, Devonne Doughty, DO  metoprolol succinate (TOPROL-XL) 25 MG 24 hr tablet Take 1 tablet (25 mg total) by mouth daily. 06/08/18   Dunn, Areta Haber, PA-C  nitroGLYCERIN (NITROSTAT) 0.4 MG SL tablet Place 1 tablet (0.4 mg total) under the tongue every 5 (five) minutes as needed for chest pain. 06/16/18   Gouru, Illene Silver, MD  ondansetron (ZOFRAN ODT) 4 MG disintegrating tablet Take 1 tablet (4 mg total) by mouth every 8 (eight) hours as needed for nausea or vomiting. 06/26/18   Lavonia Drafts, MD  ondansetron (ZOFRAN) 4 MG tablet Take 1 tablet (4 mg total) by mouth daily as needed. Patient not taking: Reported on 06/13/2018 01/05/18   Orbie Pyo, MD  oxyCODONE (OXY IR/ROXICODONE) 5 MG immediate release tablet Take 1 tablet (5 mg total) by mouth every 6 (six) hours as needed for severe pain or breakthrough pain. 06/16/18   Nicholes Mango, MD  promethazine (PHENERGAN) 25 MG tablet Take 25 mg by mouth every 6 (six) hours as needed for nausea. for nausea 05/15/18   [provider]  ranolazine (RANEXA) 1000 MG SR tablet Take 1 tablet (1,000 mg total) by mouth 2 (two) times daily. 06/16/18   Nicholes Mango, MD  ticagrelor (BRILINTA) 90 MG TABS tablet Take 90 mg by mouth 2 (two) times daily.    [provider]     Allergies Bee venom; Metformin and related; Darvon [propoxyphene]; Gabapentin; Nsaids; Tramadol; and Contrast media [iodinated diagnostic agents]  Family History  Problem Relation Age of Onset  . Non-Hodgkin's lymphoma Mother   . Osteoporosis Mother   . Lupus Sister   . Heart disease Brother   . Heart attack Brother   . Heart attack Father     Social History Social History   Tobacco Use  . Smoking status: Former Smoker    Packs/day: 0.50    Years: 30.00    Pack years: 15.00    Types: Cigarettes    Last attempt to quit: 12/19/2017    Years since quitting: 0.5  . Smokeless tobacco: Never Used  . Tobacco comment: quit 12/19/2017.  Substance Use  Topics  . Alcohol use: No  . Drug use: No    Review of Systems  Constitutional: No fever/chills Eyes: No visual changes.  ENT: No sore throat. Cardiovascular: Denies chest pain. Respiratory: Denies shortness of breath. Gastrointestinal: As above Genitourinary: Negative for dysuria. Musculoskeletal: Myalgias Skin: Negative for rash. Neurological: Negative for headaches    ____________________________________________   PHYSICAL EXAM:  VITAL SIGNS: ED Triage Vitals  Enc Vitals Group     BP 06/26/18 1120 (!) 120/98     Pulse Rate 06/26/18 1120 74     Resp 06/26/18 1120 16     Temp 06/26/18 1120 98 F (  36.7 C)     Temp Source 06/26/18 1120 Oral     SpO2 06/26/18 1120 97 %     Weight 06/26/18 1121 66 kg (145 lb 8.1 oz)     Height 06/26/18 1121 1.549 m (5\' 1" )     Head Circumference --      Peak Flow --      Pain Score 06/26/18 1121 9     Pain Loc --      Pain Edu? --      Excl. in [place]? --     Constitutional: Alert and oriented.  Eyes: Conjunctivae are normal.   Nose: No congestion/rhinnorhea. Mouth/Throat: Mucous membranes are moist.    Cardiovascular: Normal rate, regular rhythm. Grossly normal heart sounds.  Good peripheral circulation. Respiratory: Normal respiratory effort.  No retractions. Lungs CTAB. Gastrointestinal: Soft and nontender. No distention.  Reassuring exam  Musculoskeletal:Warm and well perfused Neurologic:  Normal speech and language. No gross focal neurologic deficits are appreciated.  Skin:  Skin is warm, dry and intact. No rash noted. Psychiatric: Mood and affect are normal. Speech and behavior are normal.  ____________________________________________   LABS (all labs ordered are listed, but only abnormal results are displayed)  Labs Reviewed  BASIC METABOLIC PANEL - Abnormal; Notable for the following components:      Result Value   Glucose, Bld 334 (*)    All other components within normal limits  CBC - Abnormal; Notable for the  following components:   WBC 13.2 (*)    Hemoglobin 15.8 (*)    HCT 47.6 (*)    All other components within normal limits  URINALYSIS, COMPLETE (UACMP) WITH MICROSCOPIC - Abnormal; Notable for the following components:   Color, Urine YELLOW (*)    APPearance CLEAR (*)    Specific Gravity, Urine 1.033 (*)    Glucose, UA >=500 (*)    Ketones, ur 20 (*)    Leukocytes, UA TRACE (*)    All other components within normal limits  BLOOD GAS, VENOUS - Abnormal; Notable for the following components:   pCO2, Ven 39 (*)    All other components within normal limits  GLUCOSE, CAPILLARY - Abnormal; Notable for the following components:   Glucose-Capillary 309 (*)    All other components within normal limits  CBG MONITORING, ED   ____________________________________________  EKG  None ____________________________________________  RADIOLOGY   ____________________________________________   PROCEDURES  Procedure(s) performed: No  Procedures   Critical Care performed: No ____________________________________________   INITIAL IMPRESSION / ASSESSMENT AND PLAN / ED COURSE  Pertinent labs & imaging results that were available during my care of the patient were reviewed by me and considered in my medical decision making (see chart for details).  Patient presents with nausea vomiting abdominal cramping.  Glucose is elevated at 334 but normal anion gap, normal pH.  Abdominal exam is reassuring, suspect viral gastritis/gastroenteritis.  Will treat with IV Zofran, IV fluids and reevaluate  Patient with continued complaints of abdominal cramping despite IV morphine.  CT scan obtained which was unremarkable.  Suspect cramping related to viral illness that is causing nausea and vomiting.   After further treatment patient reports feeling significantly better, appropriate for discharge at this time, strict return precautions discussed    ____________________________________________   FINAL  CLINICAL IMPRESSION(S) / ED DIAGNOSES  Final diagnoses:  Gastroenteritis        Note:  This document was prepared using Dragon voice recognition software and may include unintentional dictation errors.   Sosha Shepherd,  Herbie Baltimore, MD 06/26/18 [id number]

## 2018-06-27 ENCOUNTER — Ambulatory Visit: Payer: Medicaid Other | Admitting: Physician Assistant

## 2018-06-28 ENCOUNTER — Encounter: Payer: Self-pay | Admitting: Physician Assistant

## 2018-06-28 LAB — BLOOD GAS, VENOUS
Acid-base deficit: 1.8 mmol/L (ref 0.0–2.0)
Bicarbonate: 23.1 mmol/L (ref 20.0–28.0)
O2 Saturation: 53.5 %
Patient temperature: 37
pCO2, Ven: 39 mmHg — ABNORMAL LOW (ref 44.0–60.0)
pH, Ven: 7.38 (ref 7.250–7.430)

## 2018-07-03 ENCOUNTER — Telehealth: Payer: Self-pay

## 2018-07-03 NOTE — Telephone Encounter (Signed)
Appointment reminder call placed to Renee Mack for upcoming 1/15 appointment with Dr. Theora Gianotti. No answer. Left voicemail.

## 2018-07-05 ENCOUNTER — Inpatient Hospital Stay: Payer: Medicaid Other | Attending: Obstetrics and Gynecology | Admitting: Obstetrics and Gynecology

## 2018-07-05 ENCOUNTER — Inpatient Hospital Stay: Payer: Medicaid Other

## 2018-07-05 VITALS — BP 130/75 | HR 80 | Temp 97.8°F | Resp 18 | Ht 61.0 in | Wt 135.0 lb

## 2018-07-05 DIAGNOSIS — Z90710 Acquired absence of both cervix and uterus: Secondary | ICD-10-CM | POA: Diagnosis not present

## 2018-07-05 DIAGNOSIS — R102 Pelvic and perineal pain: Secondary | ICD-10-CM

## 2018-07-05 DIAGNOSIS — D071 Carcinoma in situ of vulva: Secondary | ICD-10-CM | POA: Diagnosis not present

## 2018-07-05 DIAGNOSIS — B373 Candidiasis of vulva and vagina: Secondary | ICD-10-CM

## 2018-07-05 DIAGNOSIS — K629 Disease of anus and rectum, unspecified: Secondary | ICD-10-CM | POA: Diagnosis not present

## 2018-07-05 DIAGNOSIS — Z87891 Personal history of nicotine dependence: Secondary | ICD-10-CM | POA: Diagnosis not present

## 2018-07-05 MED ORDER — OXYCODONE HCL 5 MG PO TABS: 5.0000 mg | ORAL_TABLET | Freq: Four times a day (QID) | ORAL | 0 refills | Status: DC | PRN

## 2018-07-05 MED ORDER — OXYCODONE HCL 5 MG PO TABS
5.0000 mg | ORAL_TABLET | Freq: Once | ORAL | Status: AC
  Administered 2018-07-05: 5 mg via ORAL
  Filled 2018-07-05: qty 1

## 2018-07-05 MED ORDER — NYSTATIN 100000 UNIT/GM EX POWD: Freq: Three times a day (TID) | CUTANEOUS | 0 refills | Status: DC

## 2018-07-05 NOTE — Patient Instructions (Signed)
Keep peri anal biopsy clean. Wash the area twice a day with soap and water and after every bowel movement.

## 2018-07-05 NOTE — Progress Notes (Signed)
Educated on how to take care of biopsy site verbally and provided in AVS. Educated further on nystatin powder and oxycodone use. She will see Dr. Baruch Gouty next Wednesday, 1/22 at 1400. She will follow up with gyn onc in 3 months or sooner if needed. This appointment has been provided.

## 2018-07-05 NOTE — Progress Notes (Signed)
Gynecologic Oncology Interval Visit   Referring Provider: Dr. Parks Ranger  Chief Complaint: VIN 3  Subjective:  Renee Mack is a 53 y.o. female s/p TH for "uterine cancer" and h/o vulvar cancer, initially seen in consultation for Dr. Parks Ranger for possible recurrent vulvar cancer, who returns to clinic today for management of VIN 3.   On 06/07/18 she underwent a vulvar biopsy with Dr. Fransisca Connors.  DIAGNOSIS:  A. VULVA; BIOPSY:  - AT LEAST HIGH-GRADE SQUAMOUS INTRAEPITHELIAL LESION (VIN 3; SEVERE DYSPLASIA).  - DUE TO FRAGMENTATION AND TANGENTIAL TISSUE ORIENTATION, DEFINITIVE INVASION CANNOT BE RELIABLY ASSESSED.   Vaginal Pap on 05/24/18 - HSIL-epithelial cell abnormality/HSIL; negative for HRHPV tested  Previous discussions with cardiology advised against stopping anti-platelet therapies for procedures or treatments. Given this, management options were discussed including radiation vs imiquimod. She returns to clinic today for discussion of management options.   However, she is in considerable pain involving the vulvar and perianal area. She is using Tylenol as well as lidocaine 2% jelly but has minimal relief.    Gynecologic Oncology History: ~ 1994  History of 'uterine cancer' s/p hysterectomy in [place] for patient reported 'dysplasia; they'd tried burning and cutting it out'- vaginal hysterectomy; Retained ovaries and 1 tube.  05/05/2004- stage I vulvar cancer 1.3 mm invasion treated with left radical vulvectomy and bilateral groin node dissection 2013-   pap benign HPV negative 04/2012- right vulvectomy for microinvasive vulvar cancer and been 3 by Dr. Maudie Mercury 10/04/2012  MOHS surgery right groin for [facility] 10/15/2014- vulvar colposcopy revealed no suspicious lesions; patient was well-appearing and transferred to ED where she was diagnosed with DKA and pancreatitis 04/07/2017- presented to ED with vulvar pain exam notable for cobblestone epithelium at left labia minora (biopsied),  cobblestoned, irritated appearing mucosa to left of urethra and vagina.  Approximately 2 mm isolated vesicle on right labia.    Macerated erythematous skin at buttocks abutting anus appearing chronically moist.  White epithelium at 6:00 of the anus but perianal exam limited by lack of stirrups.  Vulvar biopsy showed HSIL at least VIN 2. 04/12/17-  seen for ER follow-up by Dr. [name]. Per note, at least recurrent disease amenable to resection.  Left upper aspect of vulva not amenable to outpatient laser.  May also have right-sided disease but patient declined vulvar biopsy.    Chronic vulvar pain not reproducible. 11/13/2017-11/16/2017-admitted to hospital for vulvar cellulitis and underwent I&D, treated with broad-spectrum antibiotics and discharged on Bactrim. 11/24/2017-  ER visit for severe vulvovaginal candidiasis treated with oral fluconazole and topical miconazole with oxycodone for breakthrough pain.   Patient lost to follow-up.   She has history of type 2 diabetes mellitus with hemoglobin A1c of 11.9% on 03/24/18 down from 15.1% on 11/14/2017. Current smoker. CAD s/p NSTEMI 12/2017 stenting x 5, chronic combined systolic and diastolic heart failure, ischemic cardiomyopathy, GERD, pancreatitis, multiple episodes of DKA with recurrent UTI, depression, panic disorder, chronic pain syndrome. LVEF 45-50%.  She says that recent heart attack "scared her" and that she realized that she needs to start taking better care of herself.  She has been advised that she needs additional surgery for treatment of vulvar cancer but cannot have surgery until her blood sugars are under better control. She continues indefinite anticoagulation with aspirin and brilinta (antiplatelet). She has vulvar pain , worse with urination, urinary incontinence. She also complains of night sweats, headaches.  She was discharged from hospital yesterday, 05/23/18 after admission for hyperglycemia and unstable angina with underlying CAD. She  underwent cardiac catheterization and percutaneous angioplasty of prior stent.  Per her report she had a Pap 6 months ago but in the computer her last Pap was 03/2012 ([facility]). She complains of vulvar pain.    Vaginal Pap on 05/24/18 - HSIL-epithelial cell abnormality/HSILVB.  Vulvar lesion on exam concerning for VIN versus vulvar cancer recurrence.  She returns today for biopsy.  She is currently on anticoagulation per tecagrelor and aspirin post NSTEMI and cardiac cath on 05/22/18.  Cardiology was consulted for recommendation and they advised not to hold antiplatelet medications due to her very high cardiac risk.  She was hospitalized on 05/28/2018-05/29/2018 for hyperglycemia and intractable nausea and vomiting.  She was discharged home.   Problem List: Patient Active Problem List   Diagnosis Date Noted  . Hyperglycemia 05/28/2018  . Vulvar intraepithelial neoplasia (VIN) grade 3 05/24/2018  . History of ST elevation myocardial infarction (STEMI) 02/27/2018  . CAD (coronary artery disease) 02/27/2018  . Hyperlipidemia associated with type 2 diabetes mellitus ([place]) 02/27/2018  . Anxiety associated with depression 02/27/2018  . Chronic systolic heart failure ([place])   . Chest pain 12/26/2017  . Unstable angina ([place])   . Ischemic cardiomyopathy   . Former smoker   . Protein-calorie malnutrition, severe 07/18/2017  . Uncontrolled type 2 diabetes with neuropathy ([place]) 12/28/2014  . COPD (chronic obstructive pulmonary disease) ([place]) 12/28/2014  . GERD (gastroesophageal reflux disease) 12/28/2014  . History of shingles 12/28/2014  . Hyperlipidemia LDL goal <70 12/28/2014    Past Medical History: Past Medical History:  Diagnosis Date  . Cancer ([place])    vulvular  . Cervical disc disease    a. 01/2018 MRI Cervical spine: Cervical spondylosis with multilevel disc and facet degeneration greatest at C5-6 and C6-7.  Moderate to sev R C5-6, mod L C5-6, and mod bilat C6-7 foraminal stenosis with  multilevel mild foraminal stenosis.  Mild C5-6 and mod C6-7 canal stenosis. C6-7 central cord impingement w/ cord flattening.  . Chronic combined systolic (congestive) and diastolic (congestive) heart failure ([place])    a. 12/2017 Echo: EF 30-35%, mid-apicalanteroseptal, ant, and apical AK. Gr1 DD. Mild conc LVH; b. 03/2018 Echo: EF 45-50%, antsept, ant HK. Mild MR. Nl LA size. Nl RV fxn.  Marland Kitchen COPD (chronic obstructive pulmonary disease) ([facility])    not on home oxygen  . Coronary artery disease    a. 12/2017 ACS/PCI: LAD 100p (2.25x26 Resolute Onyx DES), 36m (2.0x12 Resolute Onyx DES), 80d (2.0x15 Resolute Onyx DES), RCA 90p (non-dominant). EF 25-35%. Post-MI course complicated by CGS; b. 10/4006 NSTEMI/subacute thrombosis-->LAD 100 (PTCA + DES x 1); c. 01/2018 NSTEMI/PCI: LM min irregs, LAD 50-60p ISR/hazy (3.5x12 Resolute DES), 9m/d (underexpansion of prior stents-->PTCA). EF 45-50%.  . Diabetes mellitus without complication ([place])   . GERD (gastroesophageal reflux disease)   . H/O degenerative disc disease   . Hypotension   . Ischemic cardiomyopathy    a. 12/2017 Echo: EF 30-35%; b. 03/2018 Echo: EF 45-50%.  . Myocardial infarction ([place])    a. 12/2017-->DES to LAD x 3.  . Pancreatitis   . Shingles   . Spinal stenosis   . Tobacco abuse   . Ulcer (traumatic) of oral mucosa   . Urinary incontinence   . VIN III (vulvar intraepithelial neoplasia III)     Past Surgical History: Past Surgical History:  Procedure Laterality Date  . ABDOMINAL HYSTERECTOMY     partial  . CORONARY BALLOON ANGIOPLASTY N/A 05/22/2018   Procedure: CORONARY BALLOON ANGIOPLASTY;  Surgeon: [name]  A, MD;  Location: [place] [facility];  Service: Cardiovascular;  Laterality: N/A;  . CORONARY STENT INTERVENTION N/A 12/26/2017   Procedure: CORONARY STENT INTERVENTION;  Surgeon: Wellington [place], MD;  Location: [place] [facility];  Service: Cardiovascular;  Laterality: N/A;  . CORONARY STENT INTERVENTION N/A 02/08/2018    Procedure: CORONARY STENT INTERVENTION;  Surgeon: Nelva Bush, MD;  Location: [place] [facility];  Service: Cardiovascular;  Laterality: N/A;  . CORONARY/GRAFT ACUTE MI REVASCULARIZATION N/A 12/19/2017   Procedure: Coronary/Graft Acute MI Revascularization;  Surgeon: Wellington [place], MD;  Location: [place] [facility];  Service: Cardiovascular;  Laterality: N/A;  . ECTOPIC PREGNANCY SURGERY Left   . INTRAVASCULAR ULTRASOUND/IVUS N/A 02/08/2018   Procedure: Intravascular Ultrasound/IVUS;  Surgeon: Nelva Bush, MD;  Location: [place] [facility];  Service: Cardiovascular;  Laterality: N/A;  . LEFT HEART CATH AND CORONARY ANGIOGRAPHY N/A 12/19/2017   Procedure: LEFT HEART CATH AND CORONARY ANGIOGRAPHY;  Surgeon: Wellington [place], MD;  Location: [place] [facility];  Service: Cardiovascular;  Laterality: N/A;  . LEFT HEART CATH AND CORONARY ANGIOGRAPHY N/A 12/26/2017   Procedure: LEFT HEART CATH AND CORONARY ANGIOGRAPHY;  Surgeon: Wellington [place], MD;  Location: [place] [facility];  Service: Cardiovascular;  Laterality: N/A;  . LEFT HEART CATH AND CORONARY ANGIOGRAPHY N/A 02/08/2018   Procedure: LEFT HEART CATH AND CORONARY ANGIOGRAPHY;  Surgeon: Nelva Bush, MD;  Location: [place] [facility];  Service: Cardiovascular;  Laterality: N/A;  . LEFT HEART CATH AND CORONARY ANGIOGRAPHY N/A 05/22/2018   Procedure: LEFT HEART CATH AND CORONARY ANGIOGRAPHY;  Surgeon: Wellington [place], MD;  Location: [place] [facility];  Service: Cardiovascular;  Laterality: N/A;  . LEFT HEART CATH AND CORONARY ANGIOGRAPHY N/A 06/15/2018   Procedure: LEFT HEART CATH AND CORONARY ANGIOGRAPHY;  Surgeon: Wellington [place], MD;  Location: [place] [facility];  Service: Cardiovascular;  Laterality: N/A;  . LYMPHADENECTOMY    . SPLENECTOMY, PARTIAL    . vulvulasectomy      Past Gynecologic History:  Menarche: age 74 Menstrual details- irregular menstrual periods Hysterectomy ~ 25 years ago History of  abnormal pap: yes History of STDs: denies Sexually active: denies  OB History:  [id number] Vaginal delivery  OB History  No obstetric history on file.    Family History: Family History  Problem Relation Age of Onset  . Non-Hodgkin's lymphoma Mother   . Osteoporosis Mother   . Lupus Sister   . Heart disease Brother   . Heart attack Brother   . Heart attack Father     Social History: Social History   Socioeconomic History  . Marital status: Widowed    Spouse name: Not on file  . Number of children: 4  . Years of education: Not on file  . Highest education level: Not on file  Occupational History  . Occupation: disabled  Social Needs  . Financial resource strain: Not very hard  . Food insecurity:    Worry: Never true    Inability: Never true  . Transportation needs:    Medical: No    Non-medical: No  Tobacco Use  . Smoking status: Former Smoker    Packs/day: 0.50    Years: 30.00    Pack years: 15.00    Types: Cigarettes    Last attempt to quit: 12/19/2017    Years since quitting: 0.5  . Smokeless tobacco: Never Used  . Tobacco comment: quit 12/19/2017.  Substance and Sexual Activity  . Alcohol use:  No  . Drug use: No  . Sexual activity: Not Currently  Lifestyle  . Physical activity:    Days per week: 7 days    Minutes per session: 30 min  . Stress: Very much  Relationships  . Social connections:    Talks on phone: More than three times a week    Gets together: Patient refused    Attends religious service: Never    Active member of club or organization: No    Attends meetings of clubs or organizations: Never    Relationship status: Widowed  . Intimate partner violence:    Fear of current or ex partner: No    Emotionally abused: No    Physically abused: No    Forced sexual activity: No  Other Topics Concern  . Not on file  Social History Narrative   Lives in [place] by herself.  Does not routinely exercise.    Allergies: Allergies  Allergen  Reactions  . Bee Venom Itching, Shortness Of Breath and Swelling  . Metformin And Related Shortness Of Breath  . Darvon [Propoxyphene] Itching  . Gabapentin Swelling  . Nsaids Other (See Comments)    Ulcers   . Tramadol Hives  . Contrast Media [Iodinated Diagnostic Agents] Rash    If you benadryl, and steroids she is able to take the contrast per pt    Current Medications: Current Outpatient Medications  Medication Sig Dispense Refill  . acetaminophen (TYLENOL) 325 MG tablet Take 650 mg by mouth every 6 (six) hours as needed for mild pain.     Marland Kitchen albuterol (PROAIR HFA) 108 (90 Base) MCG/ACT inhaler Inhale 1 puff into the lungs every 6 (six) hours as needed for wheezing or shortness of breath.     Marland Kitchen aspirin 81 MG tablet Take 81 mg by mouth daily.    Marland Kitchen atorvastatin (LIPITOR) 80 MG tablet Take 1 tablet (80 mg total) by mouth daily at 6 PM. 90 tablet 3  . citalopram (CELEXA) 20 MG tablet Take 1 tablet (20 mg total) by mouth daily. 90 tablet 1  . cyclobenzaprine (FLEXERIL) 5 MG tablet Take 1 tablet (5 mg total) by mouth 3 (three) times daily as needed for muscle spasms. 10 tablet 0  . dicyclomine (BENTYL) 20 MG tablet Take 1 tablet (20 mg total) by mouth 3 (three) times daily as needed for spasms. 30 tablet 0  . esomeprazole (NEXIUM) 40 MG capsule Take 40 mg by mouth daily.     . insulin aspart (NOVOLOG) 100 UNIT/ML injection Inject 25 Units into the skin 3 (three) times daily before meals. Take 10 units PLUS sliding scale (Patient taking differently: Inject 35 Units into the skin 3 (three) times daily before meals. Take 10 units PLUS sliding scale ) 22.5 mL 1  . insulin glargine (LANTUS) 100 UNIT/ML injection Inject 0.55 mLs (55 Units total) into the skin 2 (two) times daily. 33 mL 1  . lidocaine (XYLOCAINE) 2 % jelly Apply 1 application topically as needed. 85 mL 0  . LORazepam (ATIVAN) 1 MG tablet Take 1 tablet (1 mg total) by mouth 2 (two) times daily as needed for anxiety. 60 tablet 2  .  nitroGLYCERIN (NITROSTAT) 0.4 MG SL tablet Place 1 tablet (0.4 mg total) under the tongue every 5 (five) minutes as needed for chest pain. 10 tablet 0  . ondansetron (ZOFRAN ODT) 4 MG disintegrating tablet Take 1 tablet (4 mg total) by mouth every 8 (eight) hours as needed for nausea or vomiting. 20 tablet  0  . promethazine (PHENERGAN) 25 MG tablet Take 25 mg by mouth every 6 (six) hours as needed for nausea. for nausea  3  . ranolazine (RANEXA) 1000 MG SR tablet Take 1 tablet (1,000 mg total) by mouth 2 (two) times daily. 120 tablet 0  . ticagrelor (BRILINTA) 90 MG TABS tablet Take 90 mg by mouth 2 (two) times daily.    Marland Kitchen nystatin (MYCOSTATIN/NYSTOP) powder Apply topically 3 (three) times daily. 15 g 0  . oxyCODONE (OXY IR/ROXICODONE) 5 MG immediate release tablet Take 1 tablet (5 mg total) by mouth every 6 (six) hours as needed for severe pain or breakthrough pain. 20 tablet 0   No current facility-administered medications for this visit.     Review of Systems General:  Night sweats Skin: no complaints Eyes: vision impairment- awaiting eye doctor appt- unchanged HEENT: no complaints Breasts: no complaints Pulmonary: no complaints Cardiac: no complaints Gastrointestinal: negative for constipation, n/v, diarrhea Genitourinary/Sexual: urinary incontinence; pain with urination Ob/Gyn: vulvar pain & spotting Musculoskeletal: no complaints Hematology: on aspirin and Brillinta Neurologic/Psych: headaches   Objective:  Physical Examination:  BP 130/75   Pulse 80   Temp 97.8 F (36.6 C) (Tympanic)   Resp 18   Ht 5\' 1"  (1.549 m)   Wt 135 lb (61.2 kg)   BMI 25.51 kg/m     ECOG Performance Status: 1 - Symptomatic but completely ambulatory  GENERAL: chronically ill appearing female in no acute distress HEENT:  PERRL, neck supple with midline trachea.  ABDOMEN:  Soft, nontender, nondistended.  EXTREMITIES:  No peripheral edema.   NEURO:  Nonfocal. Well oriented.  Appropriate  affect.  Pelvic: EGBUS: very abnormal appearing vulvar with erythema with sharply defined velvety changes involving the bilateral labia and clitoral hood. Prior biopsy site has almost completely healed Area around the urethra on the right is a bit irregular and finding concerning for possible recurrent disease. Diffuse erythema involving the posterior vulva consistent with candidiasis.  Vagina: small 5-8 mm nodules noted otherwise negative. No bleeding.  Uterus: surgically absent Cervix: surgically absent  Perianal: 1 cm lesion at 1 o'clock firm to palpation abnormal appearing and tender.  VAGINAL COLPOSCOPY and PERIANAL BIOPSY The risks and benefits of the procedure were reviewed and informed consent obtained. Time out was performed. Risks of the biopsy including pain, bleeding (increased risk), infection were discussed. The patient stated understanding and agreed to undergo procedure today. Consent was signed, time out performed.The patient received pre-procedure teaching and expressed understanding. The post-procedure instructions were reviewed with the patient and she expressed understanding. The patient does not have any barriers to learning.  Vaginal colposcopy: The speculum was inserted and acetic acid applied. The procedure was limited by patient discomfort. No AWE identified and the nodules were not acetowhite. They blanched when touched and biopsy was no performed given risk of bleeding.   PERIANAL BIOPSY NOTE    The area to the left of the anus was numbed with topical lidocaine for at least 10 minutes followed by topical anesthesia with 2% xylocaine 4 ml. The patient's perianal area was prepped with Betadine.    A biopsy forceps was used to obtained at 1 o'clock with using biopsy forceps. Small bleeding was noted and hemostasis was achieved using Monsel's and direct pressure. Direct pressure was held for ~ 15 minutes.    The patient tolerated the procedure with some discomfort.   The  post-procedure instructions were reviewed with the patient and she expressed understanding. The patient does not  have any barriers to learning. The patient is to call with heavy bleeding, fever greater than 100.4, foul smelling vaginal discharge or other concerns.    Lab Review No labs on site today  Radiologic Imaging: No imaging on site today    Assessment:  Renee Mack is a 53 y.o. female diagnosed with history of stage I vulvar cancer 1.3 mm invasion treated with left radical vulvectomy and bilateral groin node dissection in 05/05/2004; followed by right vulvectomy for microinvasive vulvar cancer 04/2012; MOHS surgery right groin for [facility] 10/04/2012; subsequent VIN2 at least disease on biopsy 03/2017; then lost to follow up. Exam concerning for VIN versus vulvar cancer recurrence. Vulvar Biopsy at least VIN3 and definitive invasion can note be assessed.   Poor candidate for topical imiquimod or 5FU therapy.   Perianal lesion concern for malignancy, biopsy performed in clinic today.   HGSIL Vaginal Pap, negative for HRHPV types tested.   Symptomatic vulvar candidiasis-unable to use Diflucan due to contraindication with her anticoagulation therapy.   Medical co-morbidities complicating care:History of ST elevation myocardial infarction (STEMI); CAD; type 2 DM; Chronic systolic heart failure ([place]); Ischemic cardiomyopathy; Protein-calorie malnutrition, severe; COPD. Currently on anticoagulation with ticagrelor CAD s/p NSTEMI 12/2017 stenting x 5; s/p cardiac catherization 05/22/2018 and percutaneous angioplasty of prior stent.  Anticoagulation was discussed with cardiology prior to biopsy and they advised that patient was too high risk to hold medication.    Plan:   Problem List Items Addressed This Visit      Genitourinary   Vulvar intraepithelial neoplasia (VIN) grade 3 - Primary   Relevant Orders   Surgical pathology   Ambulatory referral to Radiation Oncology     VIN3, unable to  assess for definitive invasion. Given degree of vulvar discomfort she is a poor candidate for topical imiquimod or 5FU therapy. Recommend Radiation Oncology consultation.   Follow up and contact with perianal biopsy results.  If anal carcinoma suspect radiation would be the treatment recommendation.    Vulvar candidiasis, topical antifungals recommended and ordered.   HSIL vaginal Pap with negative HRHPV testing. Follow up Pap in one year.   I personally saw the patient, completed the exam, and performed the procedure.  A total of 25 minutes were spent with the patient/family today; >50% was spent in education, counseling and coordination of care for concern of anal cancer and at least high grade VIN.     CC:  Olin Hauser, DO [address] [place], [place] [address] [phone]

## 2018-07-05 NOTE — Progress Notes (Signed)
Patient having lots of pain in vulvar and anal area, especially more pain with urinating or having bowel movement.  She put cream on vulvar and burns like fire and then feels better for 2- 3 minutes and then back to bad pain. She has hard time sitting or putting pressure on it at all. She has been having bloody drainage every day from bx.

## 2018-07-07 ENCOUNTER — Telehealth: Payer: Self-pay

## 2018-07-07 LAB — SURGICAL PATHOLOGY

## 2018-07-07 NOTE — Telephone Encounter (Signed)
Dr. Theora Gianotti has reviewed peri anal pathology. Renee Mack is scheduled to see radiation oncology 1/22. She needs to keep this appointment. I attempted to Mack and notify Renee Mack with her biopsy results. Her voicemail is full. Unable to leave message. At the request of Dr. Theora Gianotti the following biopsies have been requested to be reviewed at [facility]. Request sent to pathology 07/07/18.  06/07/2018 vulvar biopsy [phone] 07/05/2018 left perianal biopsy [id number]   Oncology Nurse Navigator Documentation  Navigator Location: [facility] (07/07/18 1500)   )Navigator Encounter Type: Telephone;Diagnostic Results (07/07/18 1500) Telephone: Renee Mack (07/07/18 1500)                   Patient Visit Type: GynOnc (07/07/18 1500)                              Time Spent with Patient: 30 (07/07/18 1500)

## 2018-07-12 ENCOUNTER — Other Ambulatory Visit: Payer: Self-pay

## 2018-07-12 ENCOUNTER — Encounter: Payer: Self-pay | Admitting: Radiation Oncology

## 2018-07-12 ENCOUNTER — Telehealth: Payer: Self-pay

## 2018-07-12 ENCOUNTER — Ambulatory Visit
Admission: RE | Admit: 2018-07-12 | Discharge: 2018-07-12 | Disposition: A | Discharge status: Home / Self Care | Payer: Medicaid Other | Source: Ambulatory Visit | Attending: Radiation Oncology | Admitting: Radiation Oncology

## 2018-07-12 ENCOUNTER — Other Ambulatory Visit: Payer: Self-pay | Admitting: *Deleted

## 2018-07-12 VITALS — BP 127/74 | HR 70 | Temp 95.7°F | Resp 18 | Wt 139.4 lb

## 2018-07-12 DIAGNOSIS — M810 Age-related osteoporosis without current pathological fracture: Secondary | ICD-10-CM | POA: Insufficient documentation

## 2018-07-12 DIAGNOSIS — Z87891 Personal history of nicotine dependence: Secondary | ICD-10-CM | POA: Diagnosis not present

## 2018-07-12 DIAGNOSIS — Z90710 Acquired absence of both cervix and uterus: Secondary | ICD-10-CM | POA: Diagnosis not present

## 2018-07-12 DIAGNOSIS — Z79899 Other long term (current) drug therapy: Secondary | ICD-10-CM | POA: Insufficient documentation

## 2018-07-12 DIAGNOSIS — Z7982 Long term (current) use of aspirin: Secondary | ICD-10-CM | POA: Diagnosis not present

## 2018-07-12 DIAGNOSIS — J449 Chronic obstructive pulmonary disease, unspecified: Secondary | ICD-10-CM | POA: Diagnosis not present

## 2018-07-12 DIAGNOSIS — I2510 Atherosclerotic heart disease of native coronary artery without angina pectoris: Secondary | ICD-10-CM | POA: Diagnosis not present

## 2018-07-12 DIAGNOSIS — I429 Cardiomyopathy, unspecified: Secondary | ICD-10-CM | POA: Insufficient documentation

## 2018-07-12 DIAGNOSIS — E119 Type 2 diabetes mellitus without complications: Secondary | ICD-10-CM | POA: Insufficient documentation

## 2018-07-12 DIAGNOSIS — D071 Carcinoma in situ of vulva: Secondary | ICD-10-CM | POA: Diagnosis not present

## 2018-07-12 DIAGNOSIS — K219 Gastro-esophageal reflux disease without esophagitis: Secondary | ICD-10-CM | POA: Insufficient documentation

## 2018-07-12 DIAGNOSIS — I5042 Chronic combined systolic (congestive) and diastolic (congestive) heart failure: Secondary | ICD-10-CM | POA: Insufficient documentation

## 2018-07-12 DIAGNOSIS — Z794 Long term (current) use of insulin: Secondary | ICD-10-CM | POA: Insufficient documentation

## 2018-07-12 DIAGNOSIS — B379 Candidiasis, unspecified: Secondary | ICD-10-CM | POA: Insufficient documentation

## 2018-07-12 DIAGNOSIS — M5030 Other cervical disc degeneration, unspecified cervical region: Secondary | ICD-10-CM | POA: Diagnosis not present

## 2018-07-12 DIAGNOSIS — Z87440 Personal history of urinary (tract) infections: Secondary | ICD-10-CM | POA: Diagnosis not present

## 2018-07-12 DIAGNOSIS — N762 Acute vulvitis: Secondary | ICD-10-CM | POA: Diagnosis not present

## 2018-07-12 DIAGNOSIS — I252 Old myocardial infarction: Secondary | ICD-10-CM | POA: Insufficient documentation

## 2018-07-12 MED ORDER — SULFAMETHOXAZOLE-TRIMETHOPRIM 800-160 MG PO TABS: 1.0000 | ORAL_TABLET | Freq: Two times a day (BID) | ORAL | 0 refills | Status: DC

## 2018-07-12 MED ORDER — KETOCONAZOLE 2 % EX CREA: 1.0000 "application " | TOPICAL_CREAM | Freq: Every day | CUTANEOUS | 1 refills | Status: DC

## 2018-07-12 NOTE — Consult Note (Signed)
NEW PATIENT EVALUATION  Name: Renee Mack  MRN: [id number]  Date:   07/12/2018     DOB: 11-06-1965   This 53 y.o. female patient presents to the clinic for initial evaluation of recurrent at least high-grade dysplasia if not invasive squamous cell carcinoma of the vulva in patient status post.radical vulvectomy and bilateral groin node dissection back in 2005  REFERRING PHYSICIAN: Nobie Putnam *  CHIEF COMPLAINT:  Chief Complaint  Patient presents with  . Cancer    Initial consultation    DIAGNOSIS: The encounter diagnosis was Vulvar intraepithelial neoplasia (VIN) grade 3.   PREVIOUS INVESTIGATIONS:  CT scans reviewed Pathology reports reviewed Clinical notes reviewed  HPI: patient is a 53 year old female with history of uterine carcinoma back in 1994 status post hysterectomy. She was diagnosed in 2005 with stage I vulvar cancer well 1.3 mm invasion treated with a left radical vulvectomy and bilateral groin node dissection. In 2013 she had Mohs surgery for right groin squamous cell carcinoma.she's had numerous workups for recurrent vulvar cancer has had continued chronic contracted vulvar pain. In May 2019 she was admitted to the hospital for vulvar cellulitis underwent excision and drainage treated with broad-spectrum antibiotics and Bactrim. She's also been treated for vulvovaginal candidiasis with oral fluconazole and topical miconazole . She is a history of type 2 diabetes as coronary artery disease with chronic combined systolic and diastolic, heart failure ischemic cardiomyopathy GERD pancreatitis and episodes of diabetic ketoacidosis with recurrent UTIs.she's recently been evaluated by Dr. Theora Gianotti was noted on examination to have a vulvar lesion concerning forVIN versusinvasive vulvar cancer. She had vulvar biopsy showing at least high-grade squamous epithelial lesion VIN3 with at least severe dysplasia.patient also had a perianal biopsy which also was high-grade squamous  intraepithelial lesion with candidiasis.based on her frail condition and multiple medical morbid comorbidities she is now referred to radiation oncology for consideration of treatment. She is in significant pain and itching at this time. She's currently on antifungal powder.  PLANNED TREATMENT REGIMEN: possible radiation therapy after resolution of her current infection.  PAST MEDICAL HISTORY:  has a past medical history of Cancer ([place]), Cervical disc disease, Chronic combined systolic (congestive) and diastolic (congestive) heart failure ([facility]), COPD (chronic obstructive pulmonary disease) ([place]), Coronary artery disease, Diabetes mellitus without complication ([place]), GERD (gastroesophageal reflux disease), H/O degenerative disc disease, Hypotension, Ischemic cardiomyopathy, Myocardial infarction ([place]), Pancreatitis, Shingles, Spinal stenosis, Tobacco abuse, Ulcer (traumatic) of oral mucosa, Urinary incontinence, and VIN III (vulvar intraepithelial neoplasia III).    PAST SURGICAL HISTORY:  Past Surgical History:  Procedure Laterality Date  . ABDOMINAL HYSTERECTOMY     partial  . CORONARY BALLOON ANGIOPLASTY N/A 05/22/2018   Procedure: CORONARY BALLOON ANGIOPLASTY;  Surgeon: Wellington Hampshire, MD;  Location: [facility];  Service: Cardiovascular;  Laterality: N/A;  . CORONARY STENT INTERVENTION N/A 12/26/2017   Procedure: CORONARY STENT INTERVENTION;  Surgeon: Wellington Hampshire, MD;  Location: [facility];  Service: Cardiovascular;  Laterality: N/A;  . CORONARY STENT INTERVENTION N/A 02/08/2018   Procedure: CORONARY STENT INTERVENTION;  Surgeon: Nelva Bush, MD;  Location: [facility];  Service: Cardiovascular;  Laterality: N/A;  . CORONARY/GRAFT ACUTE MI REVASCULARIZATION N/A 12/19/2017   Procedure: Coronary/Graft Acute MI Revascularization;  Surgeon: Wellington Hampshire, MD;  Location: [facility];  Service: Cardiovascular;  Laterality: N/A;  . ECTOPIC PREGNANCY  SURGERY Left   . INTRAVASCULAR ULTRASOUND/IVUS N/A 02/08/2018   Procedure: Intravascular Ultrasound/IVUS;  Surgeon: Nelva Bush, MD;  Location:  [facility];  Service: Cardiovascular;  Laterality: N/A;  . LEFT HEART CATH AND CORONARY ANGIOGRAPHY N/A 12/19/2017   Procedure: LEFT HEART CATH AND CORONARY ANGIOGRAPHY;  Surgeon: Wellington Hampshire, MD;  Location: [facility];  Service: Cardiovascular;  Laterality: N/A;  . LEFT HEART CATH AND CORONARY ANGIOGRAPHY N/A 12/26/2017   Procedure: LEFT HEART CATH AND CORONARY ANGIOGRAPHY;  Surgeon: Wellington Hampshire, MD;  Location: [facility];  Service: Cardiovascular;  Laterality: N/A;  . LEFT HEART CATH AND CORONARY ANGIOGRAPHY N/A 02/08/2018   Procedure: LEFT HEART CATH AND CORONARY ANGIOGRAPHY;  Surgeon: Nelva Bush, MD;  Location: [facility];  Service: Cardiovascular;  Laterality: N/A;  . LEFT HEART CATH AND CORONARY ANGIOGRAPHY N/A 05/22/2018   Procedure: LEFT HEART CATH AND CORONARY ANGIOGRAPHY;  Surgeon: Wellington Hampshire, MD;  Location: [facility];  Service: Cardiovascular;  Laterality: N/A;  . LEFT HEART CATH AND CORONARY ANGIOGRAPHY N/A 06/15/2018   Procedure: LEFT HEART CATH AND CORONARY ANGIOGRAPHY;  Surgeon: Wellington Hampshire, MD;  Location: [facility];  Service: Cardiovascular;  Laterality: N/A;  . LYMPHADENECTOMY    . SPLENECTOMY, PARTIAL    . vulvulasectomy      FAMILY HISTORY: family history includes Heart attack in her brother and father; Heart disease in her brother; Lupus in her sister; Non-Hodgkin's lymphoma in her mother; Osteoporosis in her mother.  SOCIAL HISTORY:  reports that she quit smoking about 6 months ago. Her smoking use included cigarettes. She has a 15.00 pack-year smoking history. She has never used smokeless tobacco. She reports that she does not drink alcohol or use drugs.  ALLERGIES: Bee venom; Metformin and related; Darvon [propoxyphene]; Gabapentin; Nsaids;  Tramadol; and Contrast media [iodinated diagnostic agents]  MEDICATIONS:  Current Outpatient Medications  Medication Sig Dispense Refill  . acetaminophen (TYLENOL) 325 MG tablet Take 650 mg by mouth every 6 (six) hours as needed for mild pain.     Marland Kitchen albuterol (PROAIR HFA) 108 (90 Base) MCG/ACT inhaler Inhale 1 puff into the lungs every 6 (six) hours as needed for wheezing or shortness of breath.     Marland Kitchen aspirin 81 MG tablet Take 81 mg by mouth daily.    Marland Kitchen atorvastatin (LIPITOR) 80 MG tablet Take 1 tablet (80 mg total) by mouth daily at 6 PM. 90 tablet 3  . citalopram (CELEXA) 20 MG tablet Take 1 tablet (20 mg total) by mouth daily. 90 tablet 1  . cyclobenzaprine (FLEXERIL) 5 MG tablet Take 1 tablet (5 mg total) by mouth 3 (three) times daily as needed for muscle spasms. 10 tablet 0  . dicyclomine (BENTYL) 20 MG tablet Take 1 tablet (20 mg total) by mouth 3 (three) times daily as needed for spasms. 30 tablet 0  . esomeprazole (NEXIUM) 40 MG capsule Take 40 mg by mouth daily.     . insulin glargine (LANTUS) 100 UNIT/ML injection Inject 0.55 mLs (55 Units total) into the skin 2 (two) times daily. 33 mL 1  . lidocaine (XYLOCAINE) 2 % jelly Apply 1 application topically as needed. 85 mL 0  . LORazepam (ATIVAN) 1 MG tablet Take 1 tablet (1 mg total) by mouth 2 (two) times daily as needed for anxiety. 60 tablet 2  . mirtazapine (REMERON) 15 MG tablet Take 15 mg by mouth at bedtime.    . nitroGLYCERIN (NITROSTAT) 0.4 MG SL tablet Place 1 tablet (0.4 mg total) under the tongue every 5 (five) minutes as needed for  chest pain. 10 tablet 0  . nystatin (MYCOSTATIN/NYSTOP) powder Apply topically 3 (three) times daily. 15 g 0  . ondansetron (ZOFRAN ODT) 4 MG disintegrating tablet Take 1 tablet (4 mg total) by mouth every 8 (eight) hours as needed for nausea or vomiting. 20 tablet 0  . oxyCODONE (OXY IR/ROXICODONE) 5 MG immediate release tablet Take 1 tablet (5 mg total) by mouth every 6 (six) hours as needed for  severe pain or breakthrough pain. 20 tablet 0  . promethazine (PHENERGAN) 25 MG tablet Take 25 mg by mouth every 6 (six) hours as needed for nausea. for nausea  3  . ranolazine (RANEXA) 1000 MG SR tablet Take 1 tablet (1,000 mg total) by mouth 2 (two) times daily. 120 tablet 0  . ticagrelor (BRILINTA) 90 MG TABS tablet Take 90 mg by mouth 2 (two) times daily.    . insulin aspart (NOVOLOG) 100 UNIT/ML injection Inject 25 Units into the skin 3 (three) times daily before meals. Take 10 units PLUS sliding scale (Patient taking differently: Inject 35 Units into the skin 3 (three) times daily before meals. Take 10 units PLUS sliding scale ) 22.5 mL 1  . ketoconazole (NIZORAL) 2 % cream Apply 1 application topically daily. 60 g 1  . sulfamethoxazole-trimethoprim (BACTRIM DS,SEPTRA DS) 800-160 MG tablet Take 1 tablet by mouth 2 (two) times daily. 28 tablet 0   No current facility-administered medications for this encounter.     ECOG PERFORMANCE STATUS:  1 - Symptomatic but completely ambulatory  REVIEW OF SYSTEMS:  Patient denies any weight loss, fatigue, weakness, fever, chills or night sweats. Patient denies any loss of vision, blurred vision. Patient denies any ringing  of the ears or hearing loss. No irregular heartbeat. Patient denies heart murmur or history of fainting. Patient denies any chest pain or pain radiating to her upper extremities. Patient denies any shortness of breath, difficulty breathing at night, cough or hemoptysis. Patient denies any swelling in the lower legs. Patient denies any nausea vomiting, vomiting of blood, or coffee ground material in the vomitus. Patient denies any stomach pain. Patient states has had normal bowel movements no significant constipation or diarrhea. Patient denies any dysuria, hematuria or significant nocturia. Patient denies any problems walking, swelling in the joints or loss of balance. Patient denies any skin changes, loss of hair or loss of weight. Patient  denies any excessive worrying or anxiety or significant depression. Patient denies any problems with insomnia. Patient denies excessive thirst, polyuria, polydipsia. Patient denies any swollen glands, patient denies easy bruising or easy bleeding. Patient denies any recent infections, allergies or URI. Patient "s visual fields have not changed significantly in recent time.    PHYSICAL EXAM: BP 127/74 (BP Location: Left Arm, Patient Position: Sitting)   Pulse 70   Temp (!) 95.7 F (35.4 C) (Tympanic)   Resp 18   Wt 139 lb 7.1 oz (63.3 kg)   BMI 26.35 kg/m  On speculum examination vaginal vault is clear with no vaginal mucosal lesions. Area around the vagina is markedly erythematous consistent with maybe early cellulitis. This extends down to the perianal region. No evidence of inguinal adenopathy is identified.Well-developed well-nourished patient in NAD. HEENT reveals PERLA, EOMI, discs not visualized.  Oral cavity is clear. No oral mucosal lesions are identified. Neck is clear without evidence of cervical or supraclavicular adenopathy. Lungs are clear to A&P. Cardiac examination is essentially unremarkable with regular rate and rhythm without murmur rub or thrill. Abdomen is benign with no organomegaly  or masses noted. Motor sensory and DTR levels are equal and symmetric in the upper and lower extremities. Cranial nerves II through XII are grossly intact. Proprioception is intact. No peripheral adenopathy or edema is identified. No motor or sensory levels are noted. Crude visual fields are within normal range.  LABORATORY DATA: pathology reports reviewed    RADIOLOGY RESULTS:CT scan reviewed from prior stone study showing no evidence of gross disease in the pelvis.   IMPRESSION: at least VIN 3 of the vulvar region in patient with known medical history of vulvar carcinoma status post radical vulvectomy in 53 year old female  PLAN: I personally discussed the case with Dr. Limmie Patricia. I'm starting  the patient on Bactrim DS twice a day for 14 days. Also prescribing Econazole cream for some of candidiasis. I will reevaluate the patient in 2 weeks should we have some response in this area would offer a short course of radiation therapy3960 cGy in 22 fractions and evaluate for response. Risks and benefits of treatment including an increased skin reaction fatigue possible diarrhea possible increased lower urinary tract symptoms skin reaction all were described in detail to the patient.should she said showed response in hepatic therapy will proceed with simulation at that time. Patient is to call sooner with any concerns.  I would like to take this opportunity to thank you for allowing me to participate in the care of your patient.[name], MD

## 2018-07-12 NOTE — Telephone Encounter (Signed)
Consultation/slide/block request authorization form signed and faxed back to pathology. Original sent to MR for scanning.

## 2018-07-12 NOTE — Progress Notes (Signed)
Cardiology Office Note Date:  07/13/2018  Patient ID:  Renee Mack, Renee Mack 07/27/1965, MRN [id number] PCP:  Olin Hauser, DO  Cardiologist:  Dr. Fletcher Anon, MD    Chief Complaint: Hospital follow up  History of Present Illness: Renee Mack is a 53 y.o. female with history of CAD s/pMI and drug-eluting stent placement to LAD x3 in7/2019,with subsequent readmission for subacute stent thrombosis and repeat PCI x3 s/p PTCA most recently in 07/7739, combined systolic and diastolic congestive heart failure, ischemic cardiomyopathy with EF of 45 to 50%, former tobacco abuse, COPD, hyperlipidemia, GERD,poorly controlledtype 2 diabetes mellitus, and pancreatitis who presents for hospital follow-up after recent admission to [facility] from 12/24 through 12/27 for recurrent chest pain.  [name] has had a complicated few months following initial STEMI the first of July for which she underwent cardiac cath that showedLAD 100p, 20m, 80d, RCA 90p (non-dominant).She required three DES to the LAD. Her recovery was complicated by cardiogenic shock and hypotension requiring vasopressors. Following initial MI, EF was reduced to 30-35% and was provided a LifeVest at discharge. This was followed by two separate admissions for chest, neck, arm, and back pain with associated nausea and vomiting. She underwent a cardiac cath, once in July and again in August. Each requiring PCI and stent placement related to ISR of the LAD. For recurrent pain, it was recommended to consider PCI of the nondominant RCA with possible hemodynamic assessment of the coronary-pulmonary artery fisulae to exclude coronary steal phenomenon.Repeat echo showed continued improvement, with latest echo showing an EF of 45-50% in October. Her LifeVest was able to be discontinued at that time. She has had issues with neck, arm and back pain related to cervical disc disease that was found on MRI in August and was admitted again in October for the  same pain. Her cardiac work up was negative at that time, her pain was reproducible with movementand attributed to cervical disc disease. She has been noted to have relative hypotension which has precluded the addition of long-acting nitrate therapy.   She was admitted to the hospital on 12/2 with sudden onset of severe chest pain rated 8 out of 10 that began around 2 to 2:30 in the morning with associated shortness of breath, diaphoresis, nausea, and one episode of emesis. EKG showed nonspecific ST-T changes. Cardiac enzymes remain negative. Chest x-ray showed no acute disease. She underwent repeat cardiac cath on 05/22/2018 that showed significant underlying three-vessel CAD with patent LAD stents except in the mid to distal segment where there was 95% stenosis that appeared to be a combination of restenosis and possible localized thrombus. She was noted to have stable proximal RCA disease in a nondominant vessel. 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 She underwent successful PTCA to the mid LAD with noncompliant balloon high pressure. She was noted to have normal LVEDP. She was readmitted to the hospital from 12/8-12/9 with intractable nausea and vomiting and was treated for hyperglycemia in the setting of poorly controlled diabetes with blood glucose greater than 500. Cardiac enzyme was negative.  In hospital follow up on 12/19 she was doing well and denied any further chest pain or SOB.  She was compliant with medications.   She was most recently admitted to the hospital on 12/24 with intermittent chest pain that was exacerbated by coughing and similar to prior episodes.  She reported compliance with all medications.  There was associated neck and shoulder pain.  EKG was unchanged from prior studies.  Cardiac enzymes remained negative.  Chest x-ray showed aortic atherosclerosis with slight bibasilar atelectasis without edema or  consolidation.  Glucose was poorly controlled in the 300s.  CTA of the chest was negative for PE.  She continued to note 8 out of 10 chest pain and ultimately underwent repeat cardiac cath on 06/15/2018 which showed a widely patent LAD stent with no significant restenosis.  Stable 90% stenosis in the nondominant small RCA not different than previous.  There was also a small OM 2 which was subtotally occluded proximally which was unchanged from recent cardiac cath in August.  Mildly reduced LVSF with an EF of 45 to 50% with anterior wall hypokinesis.  Normal LVEDP.  Continuation of medical therapy was recommended.  Her Ranexa was titrated to 1000 mg twice daily.  Of note, there was question during this admission as to if the patient was not smoking and her hospital room.  She was a no show for her 06/27/2018 appointment in the setting of recently being seen in the ED on 1/6 for gastroenteritis and hyperglycemia.  Renal stone CT at that time showed no acute abnormalities of the abdomen or pelvis.  She comes in doing reasonably well from a cardiac perspective.  She has had a couple intermittent episodes of chest and neck pain though not as severe as her prior episodes.  She is currently chest pain-free.  When she does have these episodes she is typically cleaning her house and they improve with rest.  She is uncertain if they are related to potential angina versus her ongoing cervical spine disease.  She indicates that she has "a lot on my plate" in the setting of her ongoing cancer treatment, diabetes, and cardiac history.  She reports she is unable to have her cervical spine evaluated further at this time due to time constraints with her other medical issues.  She is compliant with all medications.  No falls since she was last seen.  No BRBPR or melena.  She continues to report she is abstaining from smoking though states "I have been tempted."  Past Medical History:  Diagnosis Date  . Cancer ([place])    vulvular   . Cervical disc disease    a. 01/2018 MRI Cervical spine: Cervical spondylosis with multilevel disc and facet degeneration greatest at C5-6 and C6-7.  Moderate to sev R C5-6, mod L C5-6, and mod bilat C6-7 foraminal stenosis with multilevel mild foraminal stenosis.  Mild C5-6 and mod C6-7 canal stenosis. C6-7 central cord impingement w/ cord flattening.  . Chronic combined systolic (congestive) and diastolic (congestive) heart failure ([place])    a. 12/2017 Echo: EF 30-35%, mid-apicalanteroseptal, ant, and apical AK. Gr1 DD. Mild conc LVH; b. 03/2018 Echo: EF 45-50%, antsept, ant HK. Mild MR. Nl LA size. Nl RV fxn.  Marland Kitchen COPD (chronic obstructive pulmonary disease) (H[facility])    not on home oxygen  . Coronary artery disease    a. 12/2017 ACS/PCI: LAD 100p (2.25x26 Resolute Onyx DES), 9m (2.0x12 Resolute Onyx DES), 80d (2.0x15 Resolute Onyx DES), RCA 90p (non-dominant). EF 25-35%. Post-MI course complicated by CGS; b. 09/979 NSTEMI/subacute thrombosis-->LAD 100 (PTCA + DES x 1); c. 01/2018 NSTEMI/PCI: LM min irregs, LAD 50-60p ISR/hazy (3.5x12 Resolute DES), 56m/d (underexpansion of prior stents-->PTCA). EF 45-50%.  . Diabetes mellitus without complication ([place])   . GERD (gastroesophageal reflux disease)   . H/O degenerative disc disease   . Hypotension   . Ischemic cardiomyopathy  a. 12/2017 Echo: EF 30-35%; b. 03/2018 Echo: EF 45-50%.  . Myocardial infarction ([place])    a. 12/2017-->DES to LAD x 3.  . Pancreatitis   . Shingles   . Spinal stenosis   . Tobacco abuse   . Ulcer (traumatic) of oral mucosa   . Urinary incontinence   . VIN III (vulvar intraepithelial neoplasia III)     Past Surgical History:  Procedure Laterality Date  . ABDOMINAL HYSTERECTOMY     partial  . CORONARY BALLOON ANGIOPLASTY N/A 05/22/2018   Procedure: CORONARY BALLOON ANGIOPLASTY;  Surgeon: Wellington Hampshire, MD;  Location: [facility];  Service: Cardiovascular;  Laterality: N/A;  . CORONARY STENT INTERVENTION N/A  12/26/2017   Procedure: CORONARY STENT INTERVENTION;  Surgeon: Wellington Hampshire, MD;  Location: [facility];  Service: Cardiovascular;  Laterality: N/A;  . CORONARY STENT INTERVENTION N/A 02/08/2018   Procedure: CORONARY STENT INTERVENTION;  Surgeon: Nelva Bush, MD;  Location: [facility];  Service: Cardiovascular;  Laterality: N/A;  . CORONARY/GRAFT ACUTE MI REVASCULARIZATION N/A 12/19/2017   Procedure: Coronary/Graft Acute MI Revascularization;  Surgeon: Wellington Hampshire, MD;  Location: [place] [facility];  Service: Cardiovascular;  Laterality: N/A;  . ECTOPIC PREGNANCY SURGERY Left   . INTRAVASCULAR ULTRASOUND/IVUS N/A 02/08/2018   Procedure: Intravascular Ultrasound/IVUS;  Surgeon: Nelva Bush, MD;  Location: [facility];  Service: Cardiovascular;  Laterality: N/A;  . LEFT HEART CATH AND CORONARY ANGIOGRAPHY N/A 12/19/2017   Procedure: LEFT HEART CATH AND CORONARY ANGIOGRAPHY;  Surgeon: Wellington Hampshire, MD;  Location: [facility];  Service: Cardiovascular;  Laterality: N/A;  . LEFT HEART CATH AND CORONARY ANGIOGRAPHY N/A 12/26/2017   Procedure: LEFT HEART CATH AND CORONARY ANGIOGRAPHY;  Surgeon: Wellington Hampshire, MD;  Location: [facility];  Service: Cardiovascular;  Laterality: N/A;  . LEFT HEART CATH AND CORONARY ANGIOGRAPHY N/A 02/08/2018   Procedure: LEFT HEART CATH AND CORONARY ANGIOGRAPHY;  Surgeon: Nelva Bush, MD;  Location: [facility];  Service: Cardiovascular;  Laterality: N/A;  . LEFT HEART CATH AND CORONARY ANGIOGRAPHY N/A 05/22/2018   Procedure: LEFT HEART CATH AND CORONARY ANGIOGRAPHY;  Surgeon: Wellington Hampshire, MD;  Location: [facility];  Service: Cardiovascular;  Laterality: N/A;  . LEFT HEART CATH AND CORONARY ANGIOGRAPHY N/A 06/15/2018   Procedure: LEFT HEART CATH AND CORONARY ANGIOGRAPHY;  Surgeon: Wellington Hampshire, MD;  Location: [place];  Service: Cardiovascular;  Laterality: N/A;    . LYMPHADENECTOMY    . SPLENECTOMY, PARTIAL    . vulvulasectomy      Current Meds  Medication Sig  . acetaminophen (TYLENOL) 325 MG tablet Take 650 mg by mouth every 6 (six) hours as needed for mild pain.   Marland Kitchen albuterol (PROAIR HFA) 108 (90 Base) MCG/ACT inhaler Inhale 1 puff into the lungs every 6 (six) hours as needed for wheezing or shortness of breath.   Marland Kitchen aspirin 81 MG tablet Take 81 mg by mouth daily.  Marland Kitchen atorvastatin (LIPITOR) 80 MG tablet Take 1 tablet (80 mg total) by mouth daily at 6 PM.  . citalopram (CELEXA) 20 MG tablet Take 1 tablet (20 mg total) by mouth daily.  . cyclobenzaprine (FLEXERIL) 5 MG tablet Take 1 tablet (5 mg total) by mouth 3 (three) times daily as needed for muscle spasms.  Marland Kitchen dicyclomine (BENTYL) 20 MG tablet Take 1 tablet (20 mg total) by mouth 3 (three) times daily as needed for spasms.  Marland Kitchen  esomeprazole (NEXIUM) 40 MG capsule Take 40 mg by mouth daily.   . insulin aspart (NOVOLOG) 100 UNIT/ML injection Inject 25 Units into the skin 3 (three) times daily before meals. Take 10 units PLUS sliding scale (Patient taking differently: Inject 35 Units into the skin 3 (three) times daily before meals. Take 10 units PLUS sliding scale )  . insulin glargine (LANTUS) 100 UNIT/ML injection Inject 0.55 mLs (55 Units total) into the skin 2 (two) times daily.  Marland Kitchen ketoconazole (NIZORAL) 2 % cream Apply 1 application topically daily.  Marland Kitchen lidocaine (XYLOCAINE) 2 % jelly Apply 1 application topically as needed.  Marland Kitchen LORazepam (ATIVAN) 1 MG tablet Take 1 tablet (1 mg total) by mouth 2 (two) times daily as needed for anxiety.  . mirtazapine (REMERON) 15 MG tablet Take 15 mg by mouth at bedtime.  . nitroGLYCERIN (NITROSTAT) 0.4 MG SL tablet Place 1 tablet (0.4 mg total) under the tongue every 5 (five) minutes as needed for chest pain.  Marland Kitchen nystatin (MYCOSTATIN/NYSTOP) powder Apply topically 3 (three) times daily.  . ondansetron (ZOFRAN ODT) 4 MG disintegrating tablet Take 1 tablet (4 mg  total) by mouth every 8 (eight) hours as needed for nausea or vomiting.  Marland Kitchen oxyCODONE (OXY IR/ROXICODONE) 5 MG immediate release tablet Take 0.5-1 tablets (2.5-5 mg total) by mouth every 6 (six) hours as needed for severe pain or breakthrough pain.  . promethazine (PHENERGAN) 25 MG tablet Take 25 mg by mouth every 6 (six) hours as needed for nausea. for nausea  . ranolazine (RANEXA) 1000 MG SR tablet Take 1 tablet (1,000 mg total) by mouth 2 (two) times daily.  Marland Kitchen sulfamethoxazole-trimethoprim (BACTRIM DS,SEPTRA DS) 800-160 MG tablet Take 1 tablet by mouth 2 (two) times daily.  . ticagrelor (BRILINTA) 90 MG TABS tablet Take 90 mg by mouth 2 (two) times daily.    Allergies:   Bee venom; Metformin and related; Darvon [propoxyphene]; Gabapentin; Nsaids; Tramadol; and Contrast media [iodinated diagnostic agents]   Social History:  The patient  reports that she quit smoking about 6 months ago. Her smoking use included cigarettes. She has a 15.00 pack-year smoking history. She has never used smokeless tobacco. She reports that she does not drink alcohol or use drugs.   Family History:  The patient's family history includes Heart attack in her brother and father; Heart disease in her brother; Lupus in her sister; Non-Hodgkin's lymphoma in her mother; Osteoporosis in her mother.  ROS:   Review of Systems  Constitutional: Positive for malaise/fatigue. Negative for chills, diaphoresis, fever and weight loss.  HENT: Negative for congestion.   Eyes: Negative for discharge and redness.  Respiratory: Negative for cough, hemoptysis, sputum production, shortness of breath and wheezing.   Cardiovascular: Positive for chest pain. Negative for palpitations, orthopnea, claudication, leg swelling and PND.  Gastrointestinal: Negative for abdominal pain, blood in stool, diarrhea, heartburn, melena, nausea and vomiting.  Genitourinary: Negative for hematuria.  Musculoskeletal: Positive for neck pain. Negative for falls  and myalgias.  Skin: Negative for rash.  Neurological: Positive for weakness. Negative for dizziness, tingling, tremors, sensory change, speech change, focal weakness and loss of consciousness.  Endo/Heme/Allergies: Does not bruise/bleed easily.  Psychiatric/Behavioral: Negative for substance abuse. The patient is not nervous/anxious.   All other systems reviewed and are negative.    PHYSICAL EXAM:  VS:  BP 130/70 (BP Location: Left Arm, Patient Position: Sitting, Cuff Size: Normal)   Pulse 72   Ht 5\' 1"  (1.549 m)   Wt 136 lb 8  oz (61.9 kg)   BMI 25.79 kg/m  BMI: Body mass index is 25.79 kg/m.  Physical Exam  Constitutional: She is oriented to person, place, and time. She appears well-developed and well-nourished.  HENT:  Head: Normocephalic and atraumatic.  Eyes: Right eye exhibits no discharge. Left eye exhibits no discharge.  Neck: Normal range of motion. No JVD present.  Cardiovascular: Normal rate, regular rhythm, S1 normal, S2 normal and normal heart sounds. Exam reveals no distant heart sounds, no friction rub, no midsystolic click and no opening snap.  No murmur heard. Pulses:      Posterior tibial pulses are 2+ on the right side and 2+ on the left side.  Pulmonary/Chest: Effort normal and breath sounds normal. No respiratory distress. She has no decreased breath sounds. She has no wheezes. She has no rales. She exhibits no tenderness.  Abdominal: Soft. She exhibits no distension. There is no abdominal tenderness.  Musculoskeletal:        General: No edema.  Neurological: She is alert and oriented to person, place, and time.  Skin: Skin is warm and dry. No cyanosis. Nails show no clubbing.  Psychiatric: She has a normal mood and affect. Her speech is normal and behavior is normal. Judgment and thought content normal.     EKG:  Was ordered and interpreted by me today. Shows NSR, 72 bpm, left axis deviation, prior septal infarct, nonspecific ST-T changes  Recent  Labs: 03/24/2018: B Natriuretic Peptide 91.0; Magnesium 1.7 05/22/2018: ALT 16; TSH 1.447 06/26/2018: BUN 15; Creatinine, Ser 0.65; Hemoglobin 15.8; Platelets 339; Potassium 4.5; Sodium 135  03/06/2018: Cholesterol 129; HDL 26; LDL Cholesterol 60; Total CHOL/HDL Ratio 5.0; Triglycerides 213; VLDL 43   Estimated Creatinine Clearance: 67.8 mL/min (by C-G formula based on SCr of 0.65 mg/dL).   Wt Readings from Last 3 Encounters:  07/13/18 136 lb 8 oz (61.9 kg)  07/12/18 139 lb 7.1 oz (63.3 kg)  07/05/18 135 lb (61.2 kg)     Other studies reviewed: Additional studies/records reviewed today include: summarized above  ASSESSMENT AND PLAN:  1. CAD involving the native coronary arteries with stable angina: She is doing well and exhibits stable symptoms.  In the past, we have been unable to add Imdur secondary to relative hypotension.  Her BP now allows for this medication to be started.  In this setting, we will start isosorbide mononitrate 30 mg daily.  She will otherwise continue Ranexa 1000 mg twice daily as well as aspirin 81 mg daily and Brilinta 90 mg twice daily.  If blood pressure allows, could consider addition of metoprolol for added antianginal effect or escalation of Imdur.  Continue aggressive risk factor modification, including improvement in diabetes control and secondary prevention.  2. HFrEF secondary to ICM: She does not appear grossly volume up.  Most recent echo from 03/2018 showed continued improvement in LV SF with an EF of 45 to 50%.  Toprol-XL has been discontinued secondary to relative hypotension.  We are adding Imdur as above.  If blood pressure remains stable with this would consider addition of low-dose Toprol and follow-up.  Relative hypotension has precluded the addition of ACE inhibitor/ARB/spironolactone in the past.  Start if able.  3. Hyperlipidemia: LDL of 60 from 02/2018 with normal AST/ALT in 05/2018.  Continue Lipitor 80 mg daily.  4. Poorly controlled diabetes: Per  PCP.  Disposition: F/u with Dr. Fletcher Anon or an APP in 3 months.  Current medicines are reviewed at length with the patient today.  The patient  did not have any concerns regarding medicines.  Signed, Christell Faith, PA-C 07/13/2018 11:41 AM     [facility] [address] [place], [place] [address] [phone]

## 2018-07-13 ENCOUNTER — Ambulatory Visit (INDEPENDENT_AMBULATORY_CARE_PROVIDER_SITE_OTHER): Payer: Medicaid Other | Admitting: Physician Assistant

## 2018-07-13 ENCOUNTER — Telehealth: Payer: Self-pay | Admitting: *Deleted

## 2018-07-13 ENCOUNTER — Encounter: Payer: Self-pay | Admitting: Physician Assistant

## 2018-07-13 VITALS — BP 130/70 | HR 72 | Ht 61.0 in | Wt 136.5 lb

## 2018-07-13 DIAGNOSIS — I5022 Chronic systolic (congestive) heart failure: Secondary | ICD-10-CM

## 2018-07-13 DIAGNOSIS — I10 Essential (primary) hypertension: Secondary | ICD-10-CM | POA: Diagnosis not present

## 2018-07-13 DIAGNOSIS — E785 Hyperlipidemia, unspecified: Secondary | ICD-10-CM

## 2018-07-13 DIAGNOSIS — E1165 Type 2 diabetes mellitus with hyperglycemia: Secondary | ICD-10-CM

## 2018-07-13 DIAGNOSIS — I255 Ischemic cardiomyopathy: Secondary | ICD-10-CM | POA: Diagnosis not present

## 2018-07-13 DIAGNOSIS — I25118 Atherosclerotic heart disease of native coronary artery with other forms of angina pectoris: Secondary | ICD-10-CM

## 2018-07-13 MED ORDER — OXYCODONE HCL 5 MG PO TABS: 2.5000 mg | ORAL_TABLET | Freq: Four times a day (QID) | ORAL | 0 refills | Status: DC | PRN

## 2018-07-13 MED ORDER — ISOSORBIDE MONONITRATE ER 30 MG PO TB24: 30.0000 mg | ORAL_TABLET | Freq: Every day | ORAL | 3 refills | Status: DC

## 2018-07-13 NOTE — Patient Instructions (Signed)
Medication Instructions:  Your physician has recommended you make the following change in your medication:  1- START Imdur 1 tablet (30 mg total) once daily   If you need a refill on your cardiac medications before your next appointment, please call your pharmacy.   Lab work: None ordered  If you have labs (blood work) drawn today and your tests are completely normal, you will receive your results only by: Marland Kitchen MyChart Message (if you have MyChart) OR . A paper copy in the mail If you have any lab test that is abnormal or we need to change your treatment, we will call you to review the results.  Testing/Procedures: None ordered   Follow-Up: At [facility], you and your health needs are our priority.  As part of our continuing mission to provide you with exceptional heart care, we have created designated Provider Care Teams.  These Care Teams include your primary Cardiologist (physician) and Advanced Practice Providers (APPs -  Physician Assistants and Nurse Practitioners) who all work together to provide you with the care you need, when you need it. You will need a follow up appointment in 3 months. You may see [name], MD or one of the following Advanced Practice Providers on your designated Care Team:   Murray Hodgkins, NP Christell Faith, PA-C . Marrianne Mood, PA-C

## 2018-07-13 NOTE — Telephone Encounter (Signed)
Called patient and discussed (see other note). Prescription sent.

## 2018-07-13 NOTE — Telephone Encounter (Signed)
Called patient to discuss. She has started antibiotic and antifungal. She has been taking tylenol for pain without significant improvement. Says she's unable to sit comfortably. Unable to tolerate tramadol d/t allergy (hives). Unable to take NSAIDs. Will send 3 day supply of oxycodone for breakthrough and expect her to continue alternating with tylenol.

## 2018-07-13 NOTE — Telephone Encounter (Signed)
Patient called reporting that she is having pain in the vulvar and anal area and needs pain medicine. She has tried ointments creams and powders to the area as well as Tylenol and nothing is helping. Please advise

## 2018-07-17 ENCOUNTER — Encounter: Payer: Self-pay | Admitting: Emergency Medicine

## 2018-07-17 ENCOUNTER — Emergency Department: Payer: Medicaid Other

## 2018-07-17 ENCOUNTER — Other Ambulatory Visit: Payer: Self-pay

## 2018-07-17 ENCOUNTER — Emergency Department
Admission: EM | Admit: 2018-07-17 | Discharge: 2018-07-17 | Disposition: A | Discharge status: Home / Self Care | Payer: Medicaid Other | Attending: Emergency Medicine | Admitting: Emergency Medicine

## 2018-07-17 DIAGNOSIS — K5900 Constipation, unspecified: Secondary | ICD-10-CM | POA: Diagnosis not present

## 2018-07-17 DIAGNOSIS — Z794 Long term (current) use of insulin: Secondary | ICD-10-CM | POA: Insufficient documentation

## 2018-07-17 DIAGNOSIS — Z79899 Other long term (current) drug therapy: Secondary | ICD-10-CM | POA: Insufficient documentation

## 2018-07-17 DIAGNOSIS — I5042 Chronic combined systolic (congestive) and diastolic (congestive) heart failure: Secondary | ICD-10-CM | POA: Insufficient documentation

## 2018-07-17 DIAGNOSIS — J449 Chronic obstructive pulmonary disease, unspecified: Secondary | ICD-10-CM | POA: Insufficient documentation

## 2018-07-17 DIAGNOSIS — Z87891 Personal history of nicotine dependence: Secondary | ICD-10-CM | POA: Insufficient documentation

## 2018-07-17 DIAGNOSIS — E119 Type 2 diabetes mellitus without complications: Secondary | ICD-10-CM | POA: Insufficient documentation

## 2018-07-17 DIAGNOSIS — R109 Unspecified abdominal pain: Secondary | ICD-10-CM

## 2018-07-17 DIAGNOSIS — Z7982 Long term (current) use of aspirin: Secondary | ICD-10-CM | POA: Diagnosis not present

## 2018-07-17 DIAGNOSIS — I2510 Atherosclerotic heart disease of native coronary artery without angina pectoris: Secondary | ICD-10-CM | POA: Diagnosis not present

## 2018-07-17 DIAGNOSIS — R1032 Left lower quadrant pain: Secondary | ICD-10-CM | POA: Diagnosis present

## 2018-07-17 LAB — LIPASE, BLOOD: Lipase: 19 U/L (ref 11–51)

## 2018-07-17 LAB — CBC WITH DIFFERENTIAL/PLATELET
Abs Immature Granulocytes: 0.04 10*3/uL (ref 0.00–0.07)
Basophils Absolute: 0 10*3/uL (ref 0.0–0.1)
Basophils Relative: 0 %
EOS ABS: 0.2 10*3/uL (ref 0.0–0.5)
Eosinophils Relative: 2 %
HCT: 43.1 % (ref 36.0–46.0)
Hemoglobin: 14.4 g/dL (ref 12.0–15.0)
IMMATURE GRANULOCYTES: 0 %
LYMPHS ABS: 2.6 10*3/uL (ref 0.7–4.0)
Lymphocytes Relative: 27 %
MCH: 31.1 pg (ref 26.0–34.0)
MCHC: 33.4 g/dL (ref 30.0–36.0)
MCV: 93.1 fL (ref 80.0–100.0)
Monocytes Absolute: 0.4 10*3/uL (ref 0.1–1.0)
Monocytes Relative: 4 %
Neutro Abs: 6.3 10*3/uL (ref 1.7–7.7)
Neutrophils Relative %: 67 %
PLATELETS: 262 10*3/uL (ref 150–400)
RBC: 4.63 MIL/uL (ref 3.87–5.11)
RDW: 13.1 % (ref 11.5–15.5)
WBC: 9.6 10*3/uL (ref 4.0–10.5)
nRBC: 0 % (ref 0.0–0.2)

## 2018-07-17 LAB — URINALYSIS, COMPLETE (UACMP) WITH MICROSCOPIC
BACTERIA UA: NONE SEEN
Bilirubin Urine: NEGATIVE
Glucose, UA: >=500 mg/dL — AB
Hgb urine dipstick: NEGATIVE
Ketones, ur: 5 mg/dL — AB
Leukocytes, UA: NEGATIVE
Nitrite: NEGATIVE
Protein, ur: NEGATIVE mg/dL
Specific Gravity, Urine: 1.026 (ref 1.005–1.030)
pH: 6 (ref 5.0–8.0)

## 2018-07-17 LAB — COMPREHENSIVE METABOLIC PANEL
ALT: 25 U/L (ref 0–44)
AST: 20 U/L (ref 15–41)
Albumin: 3.6 g/dL (ref 3.5–5.0)
Alkaline Phosphatase: 105 U/L (ref 38–126)
Anion gap: 8 (ref 5–15)
BUN: 24 mg/dL — ABNORMAL HIGH (ref 6–20)
CHLORIDE: 99 mmol/L (ref 98–111)
CO2: 25 mmol/L (ref 22–32)
Calcium: 9.1 mg/dL (ref 8.9–10.3)
Creatinine, Ser: 0.68 mg/dL (ref 0.44–1.00)
GFR calc Af Amer: >60 mL/min (ref >60)
GFR calc non Af Amer: >60 mL/min (ref >60)
GLUCOSE: 498 mg/dL — AB (ref 70–99)
Potassium: 4 mmol/L (ref 3.5–5.1)
Sodium: 132 mmol/L — ABNORMAL LOW (ref 135–145)
Total Bilirubin: 0.5 mg/dL (ref 0.3–1.2)
Total Protein: 7.4 g/dL (ref 6.5–8.1)

## 2018-07-17 LAB — GLUCOSE, CAPILLARY: Glucose-Capillary: 256 mg/dL — ABNORMAL HIGH (ref 70–99)

## 2018-07-17 MED ORDER — OXYCODONE-ACETAMINOPHEN 5-325 MG PO TABS: 1.0000 | ORAL_TABLET | Freq: Four times a day (QID) | ORAL | 0 refills | Status: DC | PRN

## 2018-07-17 MED ORDER — CEPHALEXIN 500 MG PO CAPS
500.0000 mg | ORAL_CAPSULE | Freq: Once | ORAL | Status: AC
  Administered 2018-07-17: 500 mg via ORAL
  Filled 2018-07-17: qty 1

## 2018-07-17 MED ORDER — MORPHINE SULFATE (PF) 4 MG/ML IV SOLN
4.0000 mg | Freq: Once | INTRAVENOUS | Status: AC
  Administered 2018-07-17: 4 mg via INTRAVENOUS

## 2018-07-17 MED ORDER — ONDANSETRON HCL 4 MG/2ML IJ SOLN
4.0000 mg | Freq: Once | INTRAMUSCULAR | Status: AC
  Administered 2018-07-17: 4 mg via INTRAVENOUS
  Filled 2018-07-17: qty 2

## 2018-07-17 MED ORDER — MORPHINE SULFATE (PF) 4 MG/ML IV SOLN
4.0000 mg | Freq: Once | INTRAVENOUS | Status: AC
  Administered 2018-07-17: 4 mg via INTRAVENOUS
  Filled 2018-07-17: qty 1

## 2018-07-17 MED ORDER — CEPHALEXIN 500 MG PO CAPS: 500.0000 mg | ORAL_CAPSULE | Freq: Two times a day (BID) | ORAL | 0 refills | Status: AC

## 2018-07-17 MED ORDER — MORPHINE SULFATE (PF) 4 MG/ML IV SOLN
INTRAVENOUS | Status: AC
  Administered 2018-07-17: 4 mg via INTRAVENOUS
  Filled 2018-07-17: qty 1

## 2018-07-17 MED ORDER — LACTULOSE 10 GM/15ML PO SOLN: 20.0000 g | Freq: Every day | ORAL | 0 refills | Status: DC | PRN

## 2018-07-17 MED ORDER — SODIUM CHLORIDE 0.9 % IV BOLUS
1000.0000 mL | Freq: Once | INTRAVENOUS | Status: AC
  Administered 2018-07-17: 1000 mL via INTRAVENOUS

## 2018-07-17 MED ORDER — HYDROMORPHONE HCL 1 MG/ML IJ SOLN
1.0000 mg | Freq: Once | INTRAMUSCULAR | Status: AC
  Administered 2018-07-17: 1 mg via INTRAVENOUS
  Filled 2018-07-17: qty 1

## 2018-07-17 MED ORDER — INSULIN ASPART 100 UNIT/ML ~~LOC~~ SOLN
12.0000 [IU] | Freq: Once | SUBCUTANEOUS | Status: AC
  Administered 2018-07-17: 12 [IU] via INTRAVENOUS
  Filled 2018-07-17: qty 1

## 2018-07-17 NOTE — ED Provider Notes (Signed)
[facility] Emergency Department Provider Note   ____________________________________________   First MD Initiated Contact with Patient 07/17/18 [phone]     (approximate)  I have reviewed the triage vital signs and the nursing notes.   HISTORY  Chief Complaint Flank Pain    HPI Renee Mack is a 53 y.o. female who presents to the ED from home with a chief complaint of left flank pain.  Reports waxing/waning left flank pain which worsened overnight.  Radiates to her left lower quadrant and is associated with nausea.  Take Phenergan and Zofran at home and nausea has improved.  History of kidney stones as well as pancreatitis.  Has baseline hematuria due to vulvar cancer.  Denies associated fever, chills, chest pain, shortness of breath, dysuria, diarrhea.  Denies recent travel or trauma.  Takes Brilinta.    Past Medical History:  Diagnosis Date  . Cancer ([place])    vulvular  . Cervical disc disease    a. 01/2018 MRI Cervical spine: Cervical spondylosis with multilevel disc and facet degeneration greatest at C5-6 and C6-7.  Moderate to sev R C5-6, mod L C5-6, and mod bilat C6-7 foraminal stenosis with multilevel mild foraminal stenosis.  Mild C5-6 and mod C6-7 canal stenosis. C6-7 central cord impingement w/ cord flattening.  . Chronic combined systolic (congestive) and diastolic (congestive) heart failure ([place])    a. 12/2017 Echo: EF 30-35%, mid-apicalanteroseptal, ant, and apical AK. Gr1 DD. Mild conc LVH; b. 03/2018 Echo: EF 45-50%, antsept, ant HK. Mild MR. Nl LA size. Nl RV fxn.  Marland Kitchen COPD (chronic obstructive pulmonary disease) (HCC)    not on home oxygen  . Coronary artery disease    a. 12/2017 ACS/PCI: LAD 100p (2.25x26 Resolute Onyx DES), 23m (2.0x12 Resolute Onyx DES), 80d (2.0x15 Resolute Onyx DES), RCA 90p (non-dominant). EF 25-35%. Post-MI course complicated by CGS; b. 10/2776 NSTEMI/subacute thrombosis-->LAD 100 (PTCA + DES x 1); c. 01/2018 NSTEMI/PCI: LM min  irregs, LAD 50-60p ISR/hazy (3.5x12 Resolute DES), 59m/d (underexpansion of prior stents-->PTCA). EF 45-50%.  . Diabetes mellitus without complication ([place])   . GERD (gastroesophageal reflux disease)   . H/O degenerative disc disease   . Hypotension   . Ischemic cardiomyopathy    a. 12/2017 Echo: EF 30-35%; b. 03/2018 Echo: EF 45-50%.  . Myocardial infarction ([place])    a. 12/2017-->DES to LAD x 3.  . Pancreatitis   . Shingles   . Spinal stenosis   . Tobacco abuse   . Ulcer (traumatic) of oral mucosa   . Urinary incontinence   . VIN III (vulvar intraepithelial neoplasia III)     Patient Active Problem List   Diagnosis Date Noted  . Hyperglycemia 05/28/2018  . Vulvar intraepithelial neoplasia (VIN) grade 3 05/24/2018  . History of ST elevation myocardial infarction (STEMI) 02/27/2018  . CAD (coronary artery disease) 02/27/2018  . Hyperlipidemia associated with type 2 diabetes mellitus ([place]) 02/27/2018  . Anxiety associated with depression 02/27/2018  . Chronic systolic heart failure ([place])   . Chest pain 12/26/2017  . Unstable angina ([place])   . Ischemic cardiomyopathy   . Former smoker   . Protein-calorie malnutrition, severe 07/18/2017  . Uncontrolled type 2 diabetes with neuropathy ([place]) 12/28/2014  . COPD (chronic obstructive pulmonary disease) ([place]) 12/28/2014  . GERD (gastroesophageal reflux disease) 12/28/2014  . History of shingles 12/28/2014  . Hyperlipidemia LDL goal <70 12/28/2014    Past Surgical History:  Procedure Laterality Date  . ABDOMINAL HYSTERECTOMY     partial  .  CORONARY BALLOON ANGIOPLASTY N/A 05/22/2018   Procedure: CORONARY BALLOON ANGIOPLASTY;  Surgeon: Wellington Hampshire, MD;  Location: [place] [facility];  Service: Cardiovascular;  Laterality: N/A;  . CORONARY STENT INTERVENTION N/A 12/26/2017   Procedure: CORONARY STENT INTERVENTION;  Surgeon: Wellington Hampshire, MD;  Location: [place] [facility];  Service: Cardiovascular;  Laterality: N/A;  .  CORONARY STENT INTERVENTION N/A 02/08/2018   Procedure: CORONARY STENT INTERVENTION;  Surgeon: Nelva Bush, MD;  Location: [place] [facility];  Service: Cardiovascular;  Laterality: N/A;  . CORONARY/GRAFT ACUTE MI REVASCULARIZATION N/A 12/19/2017   Procedure: Coronary/Graft Acute MI Revascularization;  Surgeon: Wellington Hampshire, MD;  Location: [place] [facility];  Service: Cardiovascular;  Laterality: N/A;  . ECTOPIC PREGNANCY SURGERY Left   . INTRAVASCULAR ULTRASOUND/IVUS N/A 02/08/2018   Procedure: Intravascular Ultrasound/IVUS;  Surgeon: Nelva Bush, MD;  Location: [place] [facility];  Service: Cardiovascular;  Laterality: N/A;  . LEFT HEART CATH AND CORONARY ANGIOGRAPHY N/A 12/19/2017   Procedure: LEFT HEART CATH AND CORONARY ANGIOGRAPHY;  Surgeon: Wellington Hampshire, MD;  Location: [place] [facility];  Service: Cardiovascular;  Laterality: N/A;  . LEFT HEART CATH AND CORONARY ANGIOGRAPHY N/A 12/26/2017   Procedure: LEFT HEART CATH AND CORONARY ANGIOGRAPHY;  Surgeon: Wellington Hampshire, MD;  Location: [place] [facility];  Service: Cardiovascular;  Laterality: N/A;  . LEFT HEART CATH AND CORONARY ANGIOGRAPHY N/A 02/08/2018   Procedure: LEFT HEART CATH AND CORONARY ANGIOGRAPHY;  Surgeon: Nelva Bush, MD;  Location: [place] [facility];  Service: Cardiovascular;  Laterality: N/A;  . LEFT HEART CATH AND CORONARY ANGIOGRAPHY N/A 05/22/2018   Procedure: LEFT HEART CATH AND CORONARY ANGIOGRAPHY;  Surgeon: Wellington Hampshire, MD;  Location: [place] [facility];  Service: Cardiovascular;  Laterality: N/A;  . LEFT HEART CATH AND CORONARY ANGIOGRAPHY N/A 06/15/2018   Procedure: LEFT HEART CATH AND CORONARY ANGIOGRAPHY;  Surgeon: Wellington Hampshire, MD;  Location: [place];  Service: Cardiovascular;  Laterality: N/A;  . LYMPHADENECTOMY    . SPLENECTOMY, PARTIAL    . vulvulasectomy      Prior to Admission medications   Medication Sig Start Date End Date Taking? Authorizing  Provider  acetaminophen (TYLENOL) 325 MG tablet Take 650 mg by mouth every 6 (six) hours as needed for mild pain.     [provider]  albuterol (PROAIR HFA) 108 (90 Base) MCG/ACT inhaler Inhale 1 puff into the lungs every 6 (six) hours as needed for wheezing or shortness of breath.     [provider]  aspirin 81 MG tablet Take 81 mg by mouth daily.    [provider]  atorvastatin (LIPITOR) 80 MG tablet Take 1 tablet (80 mg total) by mouth daily at 6 PM. 01/10/18 03/25/19  Theora Gianotti, NP  citalopram (CELEXA) 20 MG tablet Take 1 tablet (20 mg total) by mouth daily. 03/29/18   Karamalegos, Devonne Doughty, DO  cyclobenzaprine (FLEXERIL) 5 MG tablet Take 1 tablet (5 mg total) by mouth 3 (three) times daily as needed for muscle spasms. 06/16/18   Nicholes Mango, MD  dicyclomine (BENTYL) 20 MG tablet Take 1 tablet (20 mg total) by mouth 3 (three) times daily as needed for spasms. 06/26/18 06/26/19  Lavonia Drafts, MD  esomeprazole (NEXIUM) 40 MG capsule Take 40 mg by mouth daily.     [provider]  insulin aspart (NOVOLOG) 100 UNIT/ML injection Inject 25 Units into the skin 3 (three) times daily before meals. Take 10  units PLUS sliding scale Patient taking differently: Inject 35 Units into the skin 3 (three) times daily before meals. Take 10 units PLUS sliding scale  05/29/18 07/13/18  Bettey Costa, MD  insulin glargine (LANTUS) 100 UNIT/ML injection Inject 0.55 mLs (55 Units total) into the skin 2 (two) times daily. 06/16/18 07/17/18  Nicholes Mango, MD  isosorbide mononitrate (IMDUR) 30 MG 24 hr tablet Take 1 tablet (30 mg total) by mouth daily. 07/13/18 10/11/18  Rise Mu, PA-C  ketoconazole (NIZORAL) 2 % cream Apply 1 application topically daily. 07/12/18   [name], MD  lactulose (CHRONULAC) 10 GM/15ML solution Take 30 mLs (20 g total) by mouth daily as needed for mild constipation. 07/17/18   Paulette Blanch, MD  lidocaine (XYLOCAINE) 2 % jelly Apply 1  application topically as needed. 05/24/18   Verlon Au, NP  LORazepam (ATIVAN) 1 MG tablet Take 1 tablet (1 mg total) by mouth 2 (two) times daily as needed for anxiety. 05/31/18   Karamalegos, Devonne Doughty, DO  mirtazapine (REMERON) 15 MG tablet Take 15 mg by mouth at bedtime. 07/07/18   [provider]  nitroGLYCERIN (NITROSTAT) 0.4 MG SL tablet Place 1 tablet (0.4 mg total) under the tongue every 5 (five) minutes as needed for chest pain. 06/16/18   Nicholes Mango, MD  nystatin (MYCOSTATIN/NYSTOP) powder Apply topically 3 (three) times daily. 07/05/18   Gillis Ends, MD  ondansetron (ZOFRAN ODT) 4 MG disintegrating tablet Take 1 tablet (4 mg total) by mouth every 8 (eight) hours as needed for nausea or vomiting. 06/26/18   Lavonia Drafts, MD  oxyCODONE (OXY IR/ROXICODONE) 5 MG immediate release tablet Take 0.5-1 tablets (2.5-5 mg total) by mouth every 6 (six) hours as needed for severe pain or breakthrough pain. 07/13/18   Verlon Au, NP  promethazine (PHENERGAN) 25 MG tablet Take 25 mg by mouth every 6 (six) hours as needed for nausea. for nausea 05/15/18   [provider]  ranolazine (RANEXA) 1000 MG SR tablet Take 1 tablet (1,000 mg total) by mouth 2 (two) times daily. 06/16/18   Nicholes Mango, MD  sulfamethoxazole-trimethoprim (BACTRIM DS,SEPTRA DS) 800-160 MG tablet Take 1 tablet by mouth 2 (two) times daily. 07/12/18   [name], MD  ticagrelor (BRILINTA) 90 MG TABS tablet Take 90 mg by mouth 2 (two) times daily.    [provider]    Allergies Bee venom; Metformin and related; Darvon [propoxyphene]; Gabapentin; Nsaids; Tramadol; and Contrast media [iodinated diagnostic agents]  Family History  Problem Relation Age of Onset  . Non-Hodgkin's lymphoma Mother   . Osteoporosis Mother   . Lupus Sister   . Heart disease Brother   . Heart attack Brother   . Heart attack Father     Social History Social History   Tobacco Use  . Smoking status:  Former Smoker    Packs/day: 0.50    Years: 30.00    Pack years: 15.00    Types: Cigarettes    Last attempt to quit: 12/19/2017    Years since quitting: 0.5  . Smokeless tobacco: Never Used  . Tobacco comment: quit 12/19/2017.  Substance Use Topics  . Alcohol use: No  . Drug use: No    Review of Systems  Constitutional: No fever/chills Eyes: No visual changes. ENT: No sore throat. Cardiovascular: Denies chest pain. Respiratory: Denies shortness of breath. Gastrointestinal: Positive for left flank and left lower quadrant abdominal pain.  Positive for nausea and vomiting.  No diarrhea.  No  constipation. Genitourinary: Negative for dysuria. Musculoskeletal: Negative for back pain. Skin: Negative for rash. Neurological: Negative for headaches, focal weakness or numbness.   ____________________________________________   PHYSICAL EXAM:  VITAL SIGNS: ED Triage Vitals  Enc Vitals Group     BP 07/17/18 0535 (!) 155/78     Pulse Rate 07/17/18 0535 85     Resp 07/17/18 0535 18     Temp 07/17/18 0535 98.4 F (36.9 C)     Temp Source 07/17/18 0535 Oral     SpO2 07/17/18 0535 96 %     Weight 07/17/18 0536 135 lb (61.2 kg)     Height 07/17/18 0536 5\' 1"  (1.549 m)     Head Circumference --      Peak Flow --      Pain Score 07/17/18 0536 8     Pain Loc --      Pain Edu? --      Excl. in [place]? --     Constitutional: Alert and oriented. Well appearing and in mild acute distress. Eyes: Conjunctivae are normal. PERRL. EOMI. Head: Atraumatic. Nose: No congestion/rhinnorhea. Mouth/Throat: Mucous membranes are moist.  Oropharynx non-erythematous. Neck: No stridor.   Cardiovascular: Normal rate, regular rhythm. Grossly normal heart sounds.  Good peripheral circulation. Respiratory: Normal respiratory effort.  No retractions. Lungs CTAB. Gastrointestinal: Soft and mildly tender to palpation left lower quadrant without rebound or guarding. No distention. No abdominal bruits.  Mild left CVA  tenderness. Musculoskeletal: No lower extremity tenderness nor edema.  No joint effusions. Neurologic:  Normal speech and language. No gross focal neurologic deficits are appreciated. No gait instability. Skin:  Skin is warm, dry and intact. No rash noted. Psychiatric: Mood and affect are normal. Speech and behavior are normal.  ____________________________________________   LABS (all labs ordered are listed, but only abnormal results are displayed)  Labs Reviewed  COMPREHENSIVE METABOLIC PANEL - Abnormal; Notable for the following components:      Result Value   Sodium 132 (*)    Glucose, Bld 498 (*)    BUN 24 (*)    All other components within normal limits  CBC WITH DIFFERENTIAL/PLATELET  LIPASE, BLOOD  URINALYSIS, COMPLETE (UACMP) WITH MICROSCOPIC   ____________________________________________  EKG  None ____________________________________________  RADIOLOGY  ED MD interpretation: No acute findings; moderate stool burden  Official radiology report(s): Ct Renal Stone Study  Result Date: 07/17/2018 CLINICAL DATA:  Left flank pain for 4 days EXAM: CT ABDOMEN AND PELVIS WITHOUT CONTRAST TECHNIQUE: Multidetector CT imaging of the abdomen and pelvis was performed following the standard protocol without IV contrast. COMPARISON:  06/26/2018 FINDINGS: Lower chest:  No acute finding Hepatobiliary: No focal liver abnormality.No evidence of biliary obstruction or stone. Pancreas: Unremarkable. Spleen: Unremarkable. Adrenals/Urinary Tract: Negative adrenals. No hydronephrosis or ureteral stone. Left renal hilar calcification, likely vascular. Unremarkable bladder. Stomach/Bowel: Formed stool throughout the colon without obstruction or bowel inflammation. No appendicitis. Vascular/Lymphatic: Prominent aortic iliac atherosclerotic calcification for age. No mass or adenopathy. Reproductive:Distortion of the vulva that is similar to prior. There is history of vulvar cancer. Other: No ascites  or pneumoperitoneum. Incisional midline epigastric hernias containing fat and a small portion of transverse colon. Musculoskeletal: Lumbar facet degeneration.  No acute finding IMPRESSION: 1. No acute finding. 2. Formed stool throughout the colon, question constipation. 3. Chronic, incidental findings are described above. Electronically Signed   By: Monte Fantasia M.D.   On: 07/17/2018 06:42    ____________________________________________   PROCEDURES  Procedure(s) performed: None  Procedures  Critical Care performed: No  ____________________________________________   INITIAL IMPRESSION / ASSESSMENT AND PLAN / ED COURSE  As part of my medical decision making, I reviewed the following data within the [facility] notes reviewed and incorporated, Labs reviewed, Old chart reviewed, Radiograph reviewed  and Notes from prior ED visits    53 year old female who presents with left flank and abdominal pain. Differential diagnosis includes, but is not limited to, ovarian cyst, ovarian torsion, acute appendicitis, diverticulitis, urinary tract infection/pyelonephritis, endometriosis, bowel obstruction, colitis, renal colic, gastroenteritis, hernia, fibroids, endometriosis, etc.  Will obtain screening lab work, urinalysis, CT renal stone study.  Initiate IV fluid resuscitation, 1 mg IV Dilaudid given for pain, paired with 4 mg IV Zofran for nausea.  Will reassess.   Clinical Course as of Jul 17 656  Mon Jul 17, 2018  8329 Updated patient on laboratory and imaging study.  Awaiting urinalysis.  IV fluids continuing.  We will add insulin and recheck glucose.  Care transferred to Dr. Cherylann Banas.  Anticipate discharge home +/- antibiotics if she has UTI.   [JS]    Clinical Course User Index [JS] Paulette Blanch, MD     ____________________________________________   FINAL CLINICAL IMPRESSION(S) / ED DIAGNOSES  Final diagnoses:  Left flank pain  Constipation, unspecified  constipation type     ED Discharge Orders         Ordered    lactulose (CHRONULAC) 10 GM/15ML solution  Daily PRN     07/17/18 0657           Note:  This document was prepared using Dragon voice recognition software and may include unintentional dictation errors.    Paulette Blanch, MD 07/17/18 [phone]

## 2018-07-17 NOTE — ED Provider Notes (Signed)
-----------------------------------------   10:07 AM on 07/17/2018 -----------------------------------------  I took over care on this patient from Dr. Beather Arbour.  The patient had flank pain and hyperglycemia and was pending urinalysis and recheck of glucose after fluids.  The glucose is now down to the 200s.  The patient states she is feeling significantly better.  The urinalysis shows WBCs.  Although there are no other findings consistent with UTI, given that the patient's flank pain is otherwise unexplained I think would be prudent to treat empirically for likely UTI so I will prescribe a course of Keflex.  At this time the patient is stable for discharge home.  She reports that her pain related to the cancer is part of what is making it difficult for her to use the bathroom and requested a prescription for some pain medication.  I reviewed the patient's records in the PMP registry.  She has had a few other prescriptions for small quantities of oxycodone over the last 2 months, so I will prescribe a small quantity today until she can follow-up with her regular doctors.  Return precautions given, and the patient expresses understanding.   Arta Silence, MD 07/17/18 1009

## 2018-07-17 NOTE — ED Notes (Signed)
Pt alert and oriented X4, active, cooperative, pt in NAD. RR even and unlabored, color WNL.  Pt informed to return if any life threatening symptoms occur.  Discharge and followup instructions reviewed. Ambulates safely. Left with all of belongings.

## 2018-07-17 NOTE — ED Notes (Signed)
Patient reports left flank pain for approximately 4 days.  States history of kidney stones and this feels similar.  Patient reports hematuria, but reports a history of vulvar cancer and has bleeding from that.

## 2018-07-17 NOTE — Discharge Instructions (Addendum)
1.  Take lactulose as prescribed for bowel movements. 2.  Resume your MiraLAX and stool softeners daily to regulate and soften stools. 3.  Drink plenty of fluids daily. 4. Take the antibiotic as prescribed and finish the full course 5.  Return to the ER for worsening symptoms, persistent vomiting, difficulty breathing or other concerns.

## 2018-07-17 NOTE — ED Triage Notes (Signed)
Pt reports pain to her left flank region that started on Sunday. Hx of kidney stones and pancreatitis.

## 2018-07-18 ENCOUNTER — Telehealth: Payer: Self-pay

## 2018-07-18 NOTE — Telephone Encounter (Signed)
Called and spoke with Renee Mack in pathology for update on sending pathology to [facility] for review. She is still awaiting [facility] to sign off on returning slides form. She did state that she would fax request again and go ahead and send the slides.

## 2018-07-26 ENCOUNTER — Telehealth: Payer: Self-pay

## 2018-07-26 NOTE — Telephone Encounter (Signed)
[facility] has received slides. Review is in progress. Will follow.

## 2018-07-27 ENCOUNTER — Observation Stay
Admission: EM | Admit: 2018-07-27 | Discharge: 2018-07-28 | Disposition: A | Discharge status: Home / Self Care | Payer: Medicaid Other | Attending: Internal Medicine | Admitting: Internal Medicine

## 2018-07-27 ENCOUNTER — Other Ambulatory Visit: Payer: Self-pay

## 2018-07-27 ENCOUNTER — Emergency Department: Payer: Medicaid Other

## 2018-07-27 DIAGNOSIS — Z8249 Family history of ischemic heart disease and other diseases of the circulatory system: Secondary | ICD-10-CM | POA: Insufficient documentation

## 2018-07-27 DIAGNOSIS — Z7982 Long term (current) use of aspirin: Secondary | ICD-10-CM | POA: Insufficient documentation

## 2018-07-27 DIAGNOSIS — J449 Chronic obstructive pulmonary disease, unspecified: Secondary | ICD-10-CM | POA: Insufficient documentation

## 2018-07-27 DIAGNOSIS — K219 Gastro-esophageal reflux disease without esophagitis: Secondary | ICD-10-CM | POA: Diagnosis not present

## 2018-07-27 DIAGNOSIS — I252 Old myocardial infarction: Secondary | ICD-10-CM | POA: Insufficient documentation

## 2018-07-27 DIAGNOSIS — Z955 Presence of coronary angioplasty implant and graft: Secondary | ICD-10-CM | POA: Diagnosis not present

## 2018-07-27 DIAGNOSIS — R0789 Other chest pain: Principal | ICD-10-CM | POA: Insufficient documentation

## 2018-07-27 DIAGNOSIS — Z794 Long term (current) use of insulin: Secondary | ICD-10-CM | POA: Diagnosis not present

## 2018-07-27 DIAGNOSIS — I5042 Chronic combined systolic (congestive) and diastolic (congestive) heart failure: Secondary | ICD-10-CM | POA: Diagnosis not present

## 2018-07-27 DIAGNOSIS — E782 Mixed hyperlipidemia: Secondary | ICD-10-CM

## 2018-07-27 DIAGNOSIS — Z886 Allergy status to analgesic agent status: Secondary | ICD-10-CM | POA: Insufficient documentation

## 2018-07-27 DIAGNOSIS — I255 Ischemic cardiomyopathy: Secondary | ICD-10-CM | POA: Diagnosis not present

## 2018-07-27 DIAGNOSIS — I110 Hypertensive heart disease with heart failure: Secondary | ICD-10-CM | POA: Insufficient documentation

## 2018-07-27 DIAGNOSIS — Z87891 Personal history of nicotine dependence: Secondary | ICD-10-CM | POA: Insufficient documentation

## 2018-07-27 DIAGNOSIS — E1165 Type 2 diabetes mellitus with hyperglycemia: Secondary | ICD-10-CM | POA: Insufficient documentation

## 2018-07-27 DIAGNOSIS — Z66 Do not resuscitate: Secondary | ICD-10-CM | POA: Diagnosis not present

## 2018-07-27 DIAGNOSIS — I25119 Atherosclerotic heart disease of native coronary artery with unspecified angina pectoris: Secondary | ICD-10-CM | POA: Insufficient documentation

## 2018-07-27 DIAGNOSIS — I25118 Atherosclerotic heart disease of native coronary artery with other forms of angina pectoris: Secondary | ICD-10-CM | POA: Diagnosis not present

## 2018-07-27 DIAGNOSIS — E785 Hyperlipidemia, unspecified: Secondary | ICD-10-CM | POA: Diagnosis not present

## 2018-07-27 DIAGNOSIS — G894 Chronic pain syndrome: Secondary | ICD-10-CM | POA: Diagnosis not present

## 2018-07-27 DIAGNOSIS — R9431 Abnormal electrocardiogram [ECG] [EKG]: Secondary | ICD-10-CM | POA: Insufficient documentation

## 2018-07-27 DIAGNOSIS — Z79899 Other long term (current) drug therapy: Secondary | ICD-10-CM | POA: Insufficient documentation

## 2018-07-27 DIAGNOSIS — Z888 Allergy status to other drugs, medicaments and biological substances status: Secondary | ICD-10-CM | POA: Insufficient documentation

## 2018-07-27 DIAGNOSIS — F329 Major depressive disorder, single episode, unspecified: Secondary | ICD-10-CM | POA: Diagnosis not present

## 2018-07-27 DIAGNOSIS — R072 Precordial pain: Secondary | ICD-10-CM | POA: Diagnosis present

## 2018-07-27 DIAGNOSIS — E1140 Type 2 diabetes mellitus with diabetic neuropathy, unspecified: Secondary | ICD-10-CM | POA: Insufficient documentation

## 2018-07-27 DIAGNOSIS — R079 Chest pain, unspecified: Secondary | ICD-10-CM | POA: Diagnosis present

## 2018-07-27 DIAGNOSIS — K859 Acute pancreatitis without necrosis or infection, unspecified: Secondary | ICD-10-CM | POA: Insufficient documentation

## 2018-07-27 LAB — CBC
HCT: 46.2 % — ABNORMAL HIGH (ref 36.0–46.0)
Hemoglobin: 15.6 g/dL — ABNORMAL HIGH (ref 12.0–15.0)
MCH: 31.5 pg (ref 26.0–34.0)
MCHC: 33.8 g/dL (ref 30.0–36.0)
MCV: 93.3 fL (ref 80.0–100.0)
Platelets: 284 10*3/uL (ref 150–400)
RBC: 4.95 MIL/uL (ref 3.87–5.11)
RDW: 13.2 % (ref 11.5–15.5)
WBC: 9.8 10*3/uL (ref 4.0–10.5)
nRBC: 0 % (ref 0.0–0.2)

## 2018-07-27 LAB — BASIC METABOLIC PANEL
ANION GAP: 9 (ref 5–15)
BUN: 22 mg/dL — ABNORMAL HIGH (ref 6–20)
CO2: 25 mmol/L (ref 22–32)
Calcium: 9.6 mg/dL (ref 8.9–10.3)
Chloride: 96 mmol/L — ABNORMAL LOW (ref 98–111)
Creatinine, Ser: 0.77 mg/dL (ref 0.44–1.00)
GFR calc Af Amer: >60 mL/min (ref >60)
GFR calc non Af Amer: >60 mL/min (ref >60)
GLUCOSE: 478 mg/dL — AB (ref 70–99)
Potassium: 5 mmol/L (ref 3.5–5.1)
Sodium: 130 mmol/L — ABNORMAL LOW (ref 135–145)

## 2018-07-27 LAB — TROPONIN I
Troponin I: <0.03 ng/mL (ref <0.03)
Troponin I: <0.03 ng/mL (ref <0.03)
Troponin I: <0.03 ng/mL (ref <0.03)
Troponin I: <0.03 ng/mL (ref <0.03)

## 2018-07-27 LAB — GLUCOSE, CAPILLARY
GLUCOSE-CAPILLARY: 254 mg/dL — AB (ref 70–99)
Glucose-Capillary: 270 mg/dL — ABNORMAL HIGH (ref 70–99)
Glucose-Capillary: 331 mg/dL — ABNORMAL HIGH (ref 70–99)

## 2018-07-27 LAB — LIPID PANEL
Cholesterol: 197 mg/dL (ref 0–200)
HDL: 36 mg/dL — ABNORMAL LOW (ref >40)
LDL Cholesterol: 118 mg/dL — ABNORMAL HIGH (ref 0–99)
TRIGLYCERIDES: 215 mg/dL — AB (ref <150)
Total CHOL/HDL Ratio: 5.5 RATIO
VLDL: 43 mg/dL — ABNORMAL HIGH (ref 0–40)

## 2018-07-27 LAB — HEMOGLOBIN A1C
Hgb A1c MFr Bld: 12.6 % — ABNORMAL HIGH (ref 4.8–5.6)
Mean Plasma Glucose: 314.92 mg/dL

## 2018-07-27 MED ORDER — NITROGLYCERIN 0.4 MG SL SUBL
0.4000 mg | SUBLINGUAL_TABLET | SUBLINGUAL | Status: DC | PRN
  Administered 2018-07-27: 0.4 mg via SUBLINGUAL
  Filled 2018-07-27: qty 1

## 2018-07-27 MED ORDER — ACETAMINOPHEN 325 MG PO TABS
650.0000 mg | ORAL_TABLET | Freq: Four times a day (QID) | ORAL | Status: DC | PRN
  Administered 2018-07-27: 650 mg via ORAL
  Filled 2018-07-27 (×2): qty 2

## 2018-07-27 MED ORDER — PANTOPRAZOLE SODIUM 40 MG PO TBEC
40.0000 mg | DELAYED_RELEASE_TABLET | Freq: Every day | ORAL | Status: DC
  Administered 2018-07-28: 40 mg via ORAL
  Filled 2018-07-27: qty 1

## 2018-07-27 MED ORDER — ONDANSETRON 4 MG PO TBDP
4.0000 mg | ORAL_TABLET | Freq: Three times a day (TID) | ORAL | Status: DC | PRN
  Filled 2018-07-27: qty 1

## 2018-07-27 MED ORDER — MORPHINE SULFATE (PF) 2 MG/ML IV SOLN
2.0000 mg | Freq: Once | INTRAVENOUS | Status: AC
  Administered 2018-07-27: 2 mg via INTRAVENOUS
  Filled 2018-07-27: qty 1

## 2018-07-27 MED ORDER — TICAGRELOR 90 MG PO TABS
90.0000 mg | ORAL_TABLET | Freq: Two times a day (BID) | ORAL | Status: DC
  Administered 2018-07-27 – 2018-07-28 (×2): 90 mg via ORAL
  Filled 2018-07-27 (×3): qty 1

## 2018-07-27 MED ORDER — ASPIRIN EC 81 MG PO TBEC
81.0000 mg | DELAYED_RELEASE_TABLET | Freq: Every day | ORAL | Status: DC
  Administered 2018-07-28: 81 mg via ORAL
  Filled 2018-07-27: qty 1

## 2018-07-27 MED ORDER — ACETAMINOPHEN 325 MG PO TABS: 650.0000 mg | ORAL_TABLET | Freq: Four times a day (QID) | ORAL | Status: DC | PRN

## 2018-07-27 MED ORDER — SODIUM CHLORIDE 0.9 % IV BOLUS
1000.0000 mL | Freq: Once | INTRAVENOUS | Status: AC
  Administered 2018-07-27: 1000 mL via INTRAVENOUS

## 2018-07-27 MED ORDER — CYCLOBENZAPRINE HCL 10 MG PO TABS
5.0000 mg | ORAL_TABLET | Freq: Three times a day (TID) | ORAL | Status: DC | PRN
  Administered 2018-07-28 (×2): 5 mg via ORAL
  Filled 2018-07-27 (×2): qty 1

## 2018-07-27 MED ORDER — ISOSORBIDE MONONITRATE ER 30 MG PO TB24
30.0000 mg | ORAL_TABLET | Freq: Every evening | ORAL | Status: DC
  Administered 2018-07-27: 30 mg via ORAL
  Filled 2018-07-27 (×2): qty 1

## 2018-07-27 MED ORDER — MIRTAZAPINE 15 MG PO TABS
15.0000 mg | ORAL_TABLET | Freq: Every day | ORAL | Status: DC
  Administered 2018-07-27: 15 mg via ORAL
  Filled 2018-07-27: qty 1

## 2018-07-27 MED ORDER — INSULIN ASPART 100 UNIT/ML ~~LOC~~ SOLN
0.0000 [IU] | Freq: Every day | SUBCUTANEOUS | Status: DC
  Administered 2018-07-27: 4 [IU] via SUBCUTANEOUS
  Filled 2018-07-27: qty 1

## 2018-07-27 MED ORDER — CITALOPRAM HYDROBROMIDE 20 MG PO TABS
20.0000 mg | ORAL_TABLET | Freq: Every day | ORAL | Status: DC
  Administered 2018-07-28: 20 mg via ORAL
  Filled 2018-07-27: qty 1

## 2018-07-27 MED ORDER — MORPHINE SULFATE (PF) 2 MG/ML IV SOLN
2.0000 mg | INTRAVENOUS | Status: DC | PRN
  Administered 2018-07-27 – 2018-07-28 (×5): 2 mg via INTRAVENOUS
  Filled 2018-07-27 (×5): qty 1

## 2018-07-27 MED ORDER — HEPARIN SODIUM (PORCINE) 5000 UNIT/ML IJ SOLN
5000.0000 [IU] | Freq: Three times a day (TID) | INTRAMUSCULAR | Status: DC
  Administered 2018-07-27 – 2018-07-28 (×4): 5000 [IU] via SUBCUTANEOUS
  Filled 2018-07-27 (×6): qty 1

## 2018-07-27 MED ORDER — ALPRAZOLAM 0.25 MG PO TABS
0.2500 mg | ORAL_TABLET | Freq: Three times a day (TID) | ORAL | Status: DC | PRN
  Filled 2018-07-27: qty 1

## 2018-07-27 MED ORDER — OXYCODONE-ACETAMINOPHEN 5-325 MG PO TABS
1.0000 | ORAL_TABLET | Freq: Four times a day (QID) | ORAL | Status: DC | PRN
  Administered 2018-07-27 – 2018-07-28 (×3): 1 via ORAL
  Filled 2018-07-27 (×3): qty 1

## 2018-07-27 MED ORDER — MORPHINE SULFATE (PF) 4 MG/ML IV SOLN
4.0000 mg | Freq: Once | INTRAVENOUS | Status: AC
  Administered 2018-07-27: 4 mg via INTRAVENOUS
  Filled 2018-07-27: qty 1

## 2018-07-27 MED ORDER — LORAZEPAM 1 MG PO TABS
1.0000 mg | ORAL_TABLET | Freq: Two times a day (BID) | ORAL | Status: DC | PRN
  Administered 2018-07-27 – 2018-07-28 (×2): 1 mg via ORAL
  Filled 2018-07-27 (×2): qty 1

## 2018-07-27 MED ORDER — PROMETHAZINE HCL 25 MG PO TABS
25.0000 mg | ORAL_TABLET | Freq: Four times a day (QID) | ORAL | Status: DC | PRN
  Filled 2018-07-27: qty 1

## 2018-07-27 MED ORDER — ALBUTEROL SULFATE (2.5 MG/3ML) 0.083% IN NEBU: 3.0000 mL | INHALATION_SOLUTION | Freq: Four times a day (QID) | RESPIRATORY_TRACT | Status: DC | PRN

## 2018-07-27 MED ORDER — RANOLAZINE ER 500 MG PO TB12
1000.0000 mg | ORAL_TABLET | Freq: Two times a day (BID) | ORAL | Status: DC
  Administered 2018-07-27 – 2018-07-28 (×2): 1000 mg via ORAL
  Filled 2018-07-27 (×3): qty 2

## 2018-07-27 MED ORDER — INSULIN ASPART 100 UNIT/ML ~~LOC~~ SOLN
10.0000 [IU] | Freq: Three times a day (TID) | SUBCUTANEOUS | Status: DC
  Administered 2018-07-27 – 2018-07-28 (×3): 10 [IU] via SUBCUTANEOUS
  Filled 2018-07-27 (×3): qty 1

## 2018-07-27 MED ORDER — NITROGLYCERIN 0.4 MG SL SUBL: 0.4000 mg | SUBLINGUAL_TABLET | SUBLINGUAL | Status: DC | PRN

## 2018-07-27 MED ORDER — INSULIN ASPART 100 UNIT/ML ~~LOC~~ SOLN
0.0000 [IU] | Freq: Three times a day (TID) | SUBCUTANEOUS | Status: DC
  Administered 2018-07-27 – 2018-07-28 (×2): 5 [IU] via SUBCUTANEOUS
  Administered 2018-07-28: 7 [IU] via SUBCUTANEOUS
  Filled 2018-07-27 (×3): qty 1

## 2018-07-27 MED ORDER — ATORVASTATIN CALCIUM 20 MG PO TABS
80.0000 mg | ORAL_TABLET | Freq: Every day | ORAL | Status: DC
  Administered 2018-07-27: 80 mg via ORAL
  Filled 2018-07-27: qty 4
  Filled 2018-07-27: qty 1

## 2018-07-27 MED ORDER — INSULIN GLARGINE 100 UNIT/ML ~~LOC~~ SOLN
40.0000 [IU] | Freq: Two times a day (BID) | SUBCUTANEOUS | Status: DC
  Administered 2018-07-27 – 2018-07-28 (×2): 40 [IU] via SUBCUTANEOUS
  Filled 2018-07-27 (×6): qty 0.4

## 2018-07-27 MED ORDER — HYDROMORPHONE HCL 1 MG/ML IJ SOLN
1.0000 mg | Freq: Once | INTRAMUSCULAR | Status: AC
  Administered 2018-07-27: 1 mg via INTRAVENOUS
  Filled 2018-07-27: qty 1

## 2018-07-27 NOTE — ED Notes (Signed)
Pts bp after one SL NTG 95/60. Pt states "do not give me anymore nitro-it will bottom my bp out." Wants to rest and see if it calms down. Will continue to monitor.

## 2018-07-27 NOTE — ED Notes (Signed)
Cardiology to bedside at this time.

## 2018-07-27 NOTE — ED Provider Notes (Signed)
[facility] Emergency Department Provider Note   First MD Initiated Contact with Patient 07/27/18 [id number]     (approximate)  I have reviewed the triage vital signs and the nursing notes.   HISTORY  Chief Complaint Chest Pain    HPI Renee Mack is a 53 y.o. female with below list of chronic medical conditions including previous MI with 2 drug-eluting stent placement presents to the emergency department via EMS secondary to 10 out of 10 sharp midsternal chest pain with radiation to the neck and back for the patient.  Patient denies any dyspnea.  Patient denies any lower extremity pain or swelling.  Patient denies any nausea or vomiting  Past Medical History:  Diagnosis Date  . Cancer ([place])    vulvular  . Cervical disc disease    a. 01/2018 MRI Cervical spine: Cervical spondylosis with multilevel disc and facet degeneration greatest at C5-6 and C6-7.  Moderate to sev R C5-6, mod L C5-6, and mod bilat C6-7 foraminal stenosis with multilevel mild foraminal stenosis.  Mild C5-6 and mod C6-7 canal stenosis. C6-7 central cord impingement w/ cord flattening.  . Chronic combined systolic (congestive) and diastolic (congestive) heart failure ([place])    a. 12/2017 Echo: EF 30-35%, mid-apicalanteroseptal, ant, and apical AK. Gr1 DD. Mild conc LVH; b. 03/2018 Echo: EF 45-50%, antsept, ant HK. Mild MR. Nl LA size. Nl RV fxn.  Marland Kitchen COPD (chronic obstructive pulmonary disease) (HCC)    not on home oxygen  . Coronary artery disease    a. 12/2017 ACS/PCI: LAD 100p (2.25x26 Resolute Onyx DES), 73m (2.0x12 Resolute Onyx DES), 80d (2.0x15 Resolute Onyx DES), RCA 90p (non-dominant). EF 25-35%. Post-MI course complicated by CGS; b. 02/8920 NSTEMI/subacute thrombosis-->LAD 100 (PTCA + DES x 1); c. 01/2018 NSTEMI/PCI: LM min irregs, LAD 50-60p ISR/hazy (3.5x12 Resolute DES), 38m/d (underexpansion of prior stents-->PTCA). EF 45-50%.  . Diabetes mellitus without complication ([place])   . GERD  (gastroesophageal reflux disease)   . H/O degenerative disc disease   . Hypotension   . Ischemic cardiomyopathy    a. 12/2017 Echo: EF 30-35%; b. 03/2018 Echo: EF 45-50%.  . Myocardial infarction ([place])    a. 12/2017-->DES to LAD x 3.  . Pancreatitis   . Shingles   . Spinal stenosis   . Tobacco abuse   . Ulcer (traumatic) of oral mucosa   . Urinary incontinence   . VIN III (vulvar intraepithelial neoplasia III)     Patient Active Problem List   Diagnosis Date Noted  . Hyperglycemia 05/28/2018  . Vulvar intraepithelial neoplasia (VIN) grade 3 05/24/2018  . History of ST elevation myocardial infarction (STEMI) 02/27/2018  . CAD (coronary artery disease) 02/27/2018  . Hyperlipidemia associated with type 2 diabetes mellitus ([place]) 02/27/2018  . Anxiety associated with depression 02/27/2018  . Chronic systolic heart failure ([place])   . Chest pain 12/26/2017  . Unstable angina ([place])   . Ischemic cardiomyopathy   . Former smoker   . Protein-calorie malnutrition, severe 07/18/2017  . Uncontrolled type 2 diabetes with neuropathy ([place]) 12/28/2014  . COPD (chronic obstructive pulmonary disease) ([place]) 12/28/2014  . GERD (gastroesophageal reflux disease) 12/28/2014  . History of shingles 12/28/2014  . Hyperlipidemia LDL goal <70 12/28/2014    Past Surgical History:  Procedure Laterality Date  . ABDOMINAL HYSTERECTOMY     partial  . CORONARY BALLOON ANGIOPLASTY N/A 05/22/2018   Procedure: CORONARY BALLOON ANGIOPLASTY;  Surgeon: Wellington Hampshire, MD;  Location: [place] [facility];  Service: Cardiovascular;  Laterality: N/A;  . CORONARY STENT INTERVENTION N/A 12/26/2017   Procedure: CORONARY STENT INTERVENTION;  Surgeon: Wellington Hampshire, MD;  Location: [place] [facility];  Service: Cardiovascular;  Laterality: N/A;  . CORONARY STENT INTERVENTION N/A 02/08/2018   Procedure: CORONARY STENT INTERVENTION;  Surgeon: Nelva Bush, MD;  Location: [place] [facility];  Service:  Cardiovascular;  Laterality: N/A;  . CORONARY/GRAFT ACUTE MI REVASCULARIZATION N/A 12/19/2017   Procedure: Coronary/Graft Acute MI Revascularization;  Surgeon: Wellington Hampshire, MD;  Location: [place] [facility];  Service: Cardiovascular;  Laterality: N/A;  . ECTOPIC PREGNANCY SURGERY Left   . INTRAVASCULAR ULTRASOUND/IVUS N/A 02/08/2018   Procedure: Intravascular Ultrasound/IVUS;  Surgeon: Nelva Bush, MD;  Location: [place] [facility];  Service: Cardiovascular;  Laterality: N/A;  . LEFT HEART CATH AND CORONARY ANGIOGRAPHY N/A 12/19/2017   Procedure: LEFT HEART CATH AND CORONARY ANGIOGRAPHY;  Surgeon: Wellington Hampshire, MD;  Location: [place] [facility];  Service: Cardiovascular;  Laterality: N/A;  . LEFT HEART CATH AND CORONARY ANGIOGRAPHY N/A 12/26/2017   Procedure: LEFT HEART CATH AND CORONARY ANGIOGRAPHY;  Surgeon: Wellington Hampshire, MD;  Location: [place] [facility];  Service: Cardiovascular;  Laterality: N/A;  . LEFT HEART CATH AND CORONARY ANGIOGRAPHY N/A 02/08/2018   Procedure: LEFT HEART CATH AND CORONARY ANGIOGRAPHY;  Surgeon: Nelva Bush, MD;  Location: [place] [facility];  Service: Cardiovascular;  Laterality: N/A;  . LEFT HEART CATH AND CORONARY ANGIOGRAPHY N/A 05/22/2018   Procedure: LEFT HEART CATH AND CORONARY ANGIOGRAPHY;  Surgeon: Wellington Hampshire, MD;  Location: [place] [facility];  Service: Cardiovascular;  Laterality: N/A;  . LEFT HEART CATH AND CORONARY ANGIOGRAPHY N/A 06/15/2018   Procedure: LEFT HEART CATH AND CORONARY ANGIOGRAPHY;  Surgeon: Wellington Hampshire, MD;  Location: [place] [facility];  Service: Cardiovascular;  Laterality: N/A;  . LYMPHADENECTOMY    . SPLENECTOMY, PARTIAL    . vulvulasectomy      Prior to Admission medications   Medication Sig Start Date End Date Taking? Authorizing Provider  acetaminophen (TYLENOL) 325 MG tablet Take 650 mg by mouth every 6 (six) hours as needed for mild pain.     [provider]  albuterol (PROAIR  HFA) 108 (90 Base) MCG/ACT inhaler Inhale 1 puff into the lungs every 6 (six) hours as needed for wheezing or shortness of breath.     [provider]  aspirin 81 MG tablet Take 81 mg by mouth daily.    [provider]  atorvastatin (LIPITOR) 80 MG tablet Take 1 tablet (80 mg total) by mouth daily at 6 PM. 01/10/18 03/25/19  Theora Gianotti, NP  citalopram (CELEXA) 20 MG tablet Take 1 tablet (20 mg total) by mouth daily. 03/29/18   Karamalegos, Devonne Doughty, DO  cyclobenzaprine (FLEXERIL) 5 MG tablet Take 1 tablet (5 mg total) by mouth 3 (three) times daily as needed for muscle spasms. 06/16/18   Nicholes Mango, MD  dicyclomine (BENTYL) 20 MG tablet Take 1 tablet (20 mg total) by mouth 3 (three) times daily as needed for spasms. 06/26/18 06/26/19  Lavonia Drafts, MD  esomeprazole (NEXIUM) 40 MG capsule Take 40 mg by mouth daily.     [provider]  insulin aspart (NOVOLOG) 100 UNIT/ML injection Inject 25 Units into the skin 3 (three) times daily before meals. Take 10 units PLUS sliding scale Patient taking differently: Inject 35 Units into the skin 3 (three) times daily before meals. Take 10 units PLUS sliding scale  05/29/18  07/17/18  Bettey Costa, MD  insulin glargine (LANTUS) 100 UNIT/ML injection Inject 0.55 mLs (55 Units total) into the skin 2 (two) times daily. Patient taking differently: Inject 60 Units into the skin 2 (two) times daily.  06/16/18 07/17/18  Nicholes Mango, MD  isosorbide mononitrate (IMDUR) 30 MG 24 hr tablet Take 1 tablet (30 mg total) by mouth daily. 07/13/18 10/11/18  Rise Mu, PA-C  ketoconazole (NIZORAL) 2 % cream Apply 1 application topically daily. 07/12/18   [name], MD  lactulose (CHRONULAC) 10 GM/15ML solution Take 30 mLs (20 g total) by mouth daily as needed for mild constipation. 07/17/18   Paulette Blanch, MD  lidocaine (XYLOCAINE) 2 % jelly Apply 1 application topically as needed. Patient not taking: Reported on 07/17/2018 05/24/18    Verlon Au, NP  LORazepam (ATIVAN) 1 MG tablet Take 1 tablet (1 mg total) by mouth 2 (two) times daily as needed for anxiety. 05/31/18   Karamalegos, Devonne Doughty, DO  mirtazapine (REMERON) 15 MG tablet Take 15 mg by mouth at bedtime. 07/07/18   [provider]  nitroGLYCERIN (NITROSTAT) 0.4 MG SL tablet Place 1 tablet (0.4 mg total) under the tongue every 5 (five) minutes as needed for chest pain. 06/16/18   Nicholes Mango, MD  nystatin (MYCOSTATIN/NYSTOP) powder Apply topically 3 (three) times daily. 07/05/18   Gillis Ends, MD  ondansetron (ZOFRAN ODT) 4 MG disintegrating tablet Take 1 tablet (4 mg total) by mouth every 8 (eight) hours as needed for nausea or vomiting. 06/26/18   Lavonia Drafts, MD  oxyCODONE (OXY IR/ROXICODONE) 5 MG immediate release tablet Take 0.5-1 tablets (2.5-5 mg total) by mouth every 6 (six) hours as needed for severe pain or breakthrough pain. Patient not taking: Reported on 07/17/2018 07/13/18   Verlon Au, NP  oxyCODONE-acetaminophen (PERCOCET/ROXICET) 5-325 MG tablet Take 1 tablet by mouth every 6 (six) hours as needed for severe pain. 07/17/18   Arta Silence, MD  promethazine (PHENERGAN) 25 MG tablet Take 25 mg by mouth every 6 (six) hours as needed for nausea. for nausea 05/15/18   [provider]  ranolazine (RANEXA) 1000 MG SR tablet Take 1 tablet (1,000 mg total) by mouth 2 (two) times daily. 06/16/18   Nicholes Mango, MD  sulfamethoxazole-trimethoprim (BACTRIM DS,SEPTRA DS) 800-160 MG tablet Take 1 tablet by mouth 2 (two) times daily. 07/12/18   [name], MD  ticagrelor (BRILINTA) 90 MG TABS tablet Take 90 mg by mouth 2 (two) times daily.    [provider]    Allergies Bee venom; Metformin and related; Darvon [propoxyphene]; Gabapentin; Nsaids; Tramadol; and Contrast media [iodinated diagnostic agents]  Family History  Problem Relation Age of Onset  . Non-Hodgkin's lymphoma Mother   . Osteoporosis Mother   .  Lupus Sister   . Heart disease Brother   . Heart attack Brother   . Heart attack Father     Social History Social History   Tobacco Use  . Smoking status: Former Smoker    Packs/day: 0.50    Years: 30.00    Pack years: 15.00    Types: Cigarettes    Last attempt to quit: 12/19/2017    Years since quitting: 0.6  . Smokeless tobacco: Never Used  . Tobacco comment: quit 12/19/2017.  Substance Use Topics  . Alcohol use: No  . Drug use: No    Review of Systems Constitutional: No fever/chills Eyes: No visual changes. ENT: No sore throat. Cardiovascular: Positive for chest pain. Respiratory: Denies  shortness of breath. Gastrointestinal: No abdominal pain.  No nausea, no vomiting.  No diarrhea.  No constipation. Genitourinary: Negative for dysuria. Musculoskeletal: Negative for neck pain.  Negative for back pain. Integumentary: Negative for rash. Neurological: Negative for headaches, focal weakness or numbness.   ____________________________________________   PHYSICAL EXAM:  VITAL SIGNS: ED Triage Vitals  Enc Vitals Group     BP --      Pulse Rate 07/27/18 0526 72     Resp 07/27/18 0526 18     Temp 07/27/18 0526 97.6 F (36.4 C)     Temp Source 07/27/18 0526 Oral     SpO2 07/27/18 0526 99 %     Weight 07/27/18 0525 61 kg (134 lb 7.7 oz)     Height 07/27/18 0525 1.549 m (5\' 1" )     Head Circumference --      Peak Flow --      Pain Score 07/27/18 0525 8     Pain Loc --      Pain Edu? --      Excl. in [place]? --     Constitutional: Alert and oriented. Well appearing and in no acute distress. Eyes: Conjunctivae are normal.  Mouth/Throat: Mucous membranes are moist.  Oropharynx non-erythematous. Neck: No stridor.   Cardiovascular: Normal rate, regular rhythm. Good peripheral circulation. Grossly normal heart sounds. Respiratory: Normal respiratory effort.  No retractions. Lungs CTAB. Gastrointestinal: Soft and nontender. No distention.  Musculoskeletal: No lower  extremity tenderness nor edema. No gross deformities of extremities. Neurologic:  Normal speech and language. No gross focal neurologic deficits are appreciated.  Skin:  Skin is warm, dry and intact. No rash noted.   ____________________________________________   LABS (all labs ordered are listed, but only abnormal results are displayed)  Labs Reviewed  BASIC METABOLIC PANEL - Abnormal; Notable for the following components:      Result Value   Sodium 130 (*)    Chloride 96 (*)    Glucose, Bld 478 (*)    BUN 22 (*)    All other components within normal limits  CBC - Abnormal; Notable for the following components:   Hemoglobin 15.6 (*)    HCT 46.2 (*)    All other components within normal limits  TROPONIN I   ____________________________________________  EKG  ED ECG REPORT I, Coleman N BROWN, the attending physician, personally viewed and interpreted this ECG.   Date: 07/27/2018  EKG Time: 5:26 AM  Rate: 71  Rhythm: Normal sinus rhythm  Axis: Normal  Intervals: Normal  ST&T Change: None  ____________________________________________  RADIOLOGY I, [place] N BROWN, personally viewed and evaluated these images (plain radiographs) as part of my medical decision making, as well as reviewing the written report by the radiologist.  ED MD interpretation: No no acute findings noted on chest x-ray per radiologist.  Official radiology report(s): Dg Chest Port 1 View  Result Date: 07/27/2018 CLINICAL DATA:  Chest pain EXAM: PORTABLE CHEST 1 VIEW COMPARISON:  06/13/2018 FINDINGS: Normal heart size and mediastinal contours. Noted coronary stent. No acute infiltrate or edema. No effusion or pneumothorax. No acute osseous findings. IMPRESSION: No active disease. Electronically Signed   By: Monte Fantasia M.D.   On: 07/27/2018 05:54      Procedures   ____________________________________________   INITIAL IMPRESSION / ASSESSMENT AND PLAN / ED COURSE  As part of my medical  decision making, I reviewed the following data within the electronic MEDICAL RECORD NUMBER  53 year old female presented with above-stated history and  physical exam secondary to chest pain.  EKG revealed no evidence of ischemia or infarction.  Laboratory data including troponin negative x1 thus far.  Patient given IV morphine 2 mg with improvement of pain current pain score 6 out of 10.  Plan to obtain second troponin and discussed the patient with Dr. Fletcher Anon with disposition pending Dr. Fletcher Anon input. Patient's care transferred to Dr Joni Fears ____________________________________________  FINAL CLINICAL IMPRESSION(S) / ED DIAGNOSES  Final diagnoses:  Other chest pain     MEDICATIONS GIVEN DURING THIS VISIT:  Medications  morphine 2 MG/ML injection 2 mg (2 mg Intravenous Given 07/27/18 0531)     ED Discharge Orders    None       Note:  This document was prepared using Dragon voice recognition software and may include unintentional dictation errors.   Gregor Hams, MD 07/27/18 [phone]

## 2018-07-27 NOTE — ED Notes (Signed)
Pt complaining of severe chest pain with radiation through to back, pt appears nauseated without any emesis.  Pt given PO medication without any relief.  Repeat EKG done.  Hospitalist, [place] made aware.

## 2018-07-27 NOTE — ED Triage Notes (Signed)
Pt arrived via [facility] from home with c/o chest pain. EMS states pt went to sleep with CP and took night meds but awoke to 10/10 chest pain that was midsternal. EMS states pt had CBG of 478 and took 4 baby aspirin and 1 nitro sublingual.

## 2018-07-27 NOTE — ED Provider Notes (Signed)
-----------------------------------------   8:08 AM on 07/27/2018 ----------------------------------------- Care discussed with Dr. Fletcher Anon who notes that the patient has had multiple heart catheterizations over the past 6 months, most recently at the end of December.  They have been negative in the past.    Given these recurrent chronic symptoms with repeated reassuring work-ups, Dr. Fletcher Anon notes that if pain can be controlled he would recommend discharge home and outpatient follow-up.  However, if her pain still remains significant and persistent, he agrees that 23-hour observation would be appropriate although he would not plan to repeat a stress or cardiac cath at this point.  Patient was given 4 mg of morphine 30 minutes ago.  I will reassess response.  ----------------------------------------- 8:53 AM on 07/27/2018 -----------------------------------------  Pain 4/10.  Repeat troponin pending.  Patient comfortable with outpatient follow-up if troponin negative.  Will reassess.  ----------------------------------------- 10:36 AM on 07/27/2018 -----------------------------------------  Pain worsening again.  Plan to hospitalize for further telemetry observation.  Dilaudid 1 mg IV.  Will repeat EKG.  Final diagnoses:  Other chest pain  Precordial pain      Carrie Mew, MD 07/27/18 1038

## 2018-07-27 NOTE — Consult Note (Signed)
Cardiology Consultation:   Patient ID: [name] MRN: [id number]; DOB: 01/16/1966  Admit date: 07/27/2018 Date of Consult: 07/27/2018  Primary Care Provider: Olin Hauser, DO Primary Cardiologist: [name], MD  Primary Electrophysiologist:  None    Patient Profile:   Renee Mack is a 53 y.o. female with a hx of CAD s/pMI and drug-eluting stent placement to LAD x3 in7/2019,with subsequent readmission for subacute stent thrombosis and repeat PCI x3s/p PTCA most recently in 34/1962, combined systolic and diastolic congestive heart failure, ischemic cardiomyopathy with EF of 45%- 50%, former tobacco abuse, COPD, hyperlipidemia, GERD,poorly controlledtype 2 diabetes mellitus, and pancreatitiswho is being seen today for the evaluation of chest pain at the request of Dr. Anselm Jungling.  History of Present Illness:   [name]  has had a complicated few months following initial STEMI the first of July for which she underwent cardiac cath that showedLAD 100p, 12m, 80d, RCA 90p (non-dominant).She required three DES to the LAD. Her recovery was complicated by cardiogenic shock and hypotension requiring vasopressors. Following initial MI, EF was reduced to 30-35% and was provided a LifeVest at discharge. This was followed by two separate admissions for chest, neck, arm, and back pain with associated nausea and vomiting. She underwent a cardiac cath, once in July and again in August. Each requiring PCI and stent placement related to ISR of the LAD.Upon cath in 82/019, under-expansion of prior LAD stents was noted.For recurrent pain, it was recommended to consider PCI of the nondominant RCA with possible hemodynamic assessment of the coronary-pulmonary artery fisulae to exclude coronary steal phenomenon.Repeat echo showed continued improvement, with latest echo showing an EF of 45-50% in October. Her LifeVest was able to be discontinued at that time. She has had issues with neck, arm  and back pain related to cervical disc disease that was found on MRI in August and was admitted again in October for the same pain. Her cardiac work up was negative at that time, her pain was reproducible with movementand attributed to cervical disc disease.She was last seen in the office on 05/03/2018, and was doing very well at that time. She has been noted to have relative hypotension which has precluded the addition of long-acting nitrate therapy.   She was admitted to the hospital on 12/2 with sudden onset of severe chest pain rated 8 out of 10 that began around 2 to 2:30 in the morning with associated shortness of breath, diaphoresis, nausea, and one episode of emesis. EKG showed nonspecific ST-T changes. Cardiac enzymes remain negative. Chest x-ray showed no acute disease. She underwent repeat cardiac cath on 05/22/2018 that showed significant underlying three-vessel CAD with patent LAD stents except in the mid to distal segment where there was 95% stenosis that appeared to be a combination of restenosis and possible localized thrombus. She was noted to have stable proximal RCA disease in a nondominant vessel. 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 She underwent successful PTCA to the mid LAD with noncompliant balloon high pressure. She was noted to have normal LVEDP. She was readmitted to the hospital from 12/8-12/9 with intractable nausea and vomiting and was treated for hyperglycemia in the setting of poorly controlled diabetes with blood glucose greater than 500. Cardiac enzyme was negative.  In hospital follow up on 12/19 she was doing well and denied any further chest pain or SOB.  She was compliant with medications.   She was woken up 12/24 in the  morning with substernal chest pain that was rated an 8/10, and exacerbated by coughing. There again was associated neck and shoulder pain. No increased SOB, diaphoresis, nausea,  vomiting, dizziness, presyncope, or syncope. EKG unchanged from prior, CXR showed aortic atherosclerosis with slight bibasilar atelectasis without edema or consolidation. On examination, she had atypical pleuritic chest pain, worsened by coughing, and TTP.  She r/o for MI as negative enzymes and EKG without acute changes. CTA of the chest was negative for PE.  She continued to note 8 out of 10 chest pain and ultimately underwent repeat cardiac cath on 06/15/2018 which showed a widely patent LAD stent with no significant restenosis.  Stable 90% stenosis in the nondominant small RCA not different than previous.  There was also a small OM 2 which was subtotally occluded proximally which was unchanged from recent cardiac cath in August.  Mildly reduced LVSF with an EF of 45 to 50% with anterior wall hypokinesis.  Normal LVEDP.  Continuation of medical therapy was recommended.  Her Ranexa was titrated to 1000 mg twice daily.  Of note, there was question during this admission as to if the patient was not smoking and her hospital room.   She was a no show for her 06/27/2018 appointment in the setting of recently being seen in the ED on 1/6 for gastroenteritis and hyperglycemia.  On 07/13/2018, she was seen in office and noted to be doing well from a cardiac perspective with intermittent episodes of chest and neck pain though not as severe as previous episodes and usually occurring with exertion. She was undergoing ongoing CA treatment and "had a lot on her plate." She reported medication compliance and states she was still abstaining from smoking.   Today, she presents to [facility] ED with c/o chest pain that woke her around 2AM and was described as starting in her central chest and radiating to her left shoulder and arm. Associated sx included SOB. She took 3 nitro and one ASA with minimal relief. In the ED, EKG without acute changes. Vitals significant for hypotension with BP 99/59 and patient documented as refusing nitro  d/t low BP. She received 4mg  morphine and 1mg  IV dilaudid with minimal relief. Troponin negative x3. In the ED, patient reported she did also reported chest pain with radiation to back, as well as nausea without emesis. She reported lower back pain, stating "I think I have a kidney infection given this pain." Cardiology consulted for further management.   Past Medical History:  Diagnosis Date  . Cancer ([place])    vulvular  . Cervical disc disease    a. 01/2018 MRI Cervical spine: Cervical spondylosis with multilevel disc and facet degeneration greatest at C5-6 and C6-7.  Moderate to sev R C5-6, mod L C5-6, and mod bilat C6-7 foraminal stenosis with multilevel mild foraminal stenosis.  Mild C5-6 and mod C6-7 canal stenosis. C6-7 central cord impingement w/ cord flattening.  . Chronic combined systolic (congestive) and diastolic (congestive) heart failure ([place])    a. 12/2017 Echo: EF 30-35%, mid-apicalanteroseptal, ant, and apical AK. Gr1 DD. Mild conc LVH; b. 03/2018 Echo: EF 45-50%, antsept, ant HK. Mild MR. Nl LA size. Nl RV fxn.  Marland Kitchen COPD (chronic obstructive pulmonary disease) (H[facility])    not on home oxygen  . Coronary artery disease    a. 12/2017 ACS/PCI: LAD 100p (2.25x26 Resolute Onyx DES), 1m (2.0x12 Resolute Onyx DES), 80d (2.0x15 Resolute Onyx DES), RCA 90p (non-dominant). EF 25-35%. Post-MI course complicated by CGS; b. 11/5033 NSTEMI/subacute  thrombosis-->LAD 100 (PTCA + DES x 1); c. 01/2018 NSTEMI/PCI: LM min irregs, LAD 50-60p ISR/hazy (3.5x12 Resolute DES), 45m/d (underexpansion of prior stents-->PTCA). EF 45-50%.  . Diabetes mellitus without complication ([place])   . GERD (gastroesophageal reflux disease)   . H/O degenerative disc disease   . Hypotension   . Ischemic cardiomyopathy    a. 12/2017 Echo: EF 30-35%; b. 03/2018 Echo: EF 45-50%.  . Myocardial infarction ([place])    a. 12/2017-->DES to LAD x 3.  . Pancreatitis   . Shingles   . Spinal stenosis   . Tobacco abuse   . Ulcer (traumatic) of  oral mucosa   . Urinary incontinence   . VIN III (vulvar intraepithelial neoplasia III)     Past Surgical History:  Procedure Laterality Date  . ABDOMINAL HYSTERECTOMY     partial  . CORONARY BALLOON ANGIOPLASTY N/A 05/22/2018   Procedure: CORONARY BALLOON ANGIOPLASTY;  Surgeon: Wellington Hampshire, MD;  Location: [place] [facility];  Service: Cardiovascular;  Laterality: N/A;  . CORONARY STENT INTERVENTION N/A 12/26/2017   Procedure: CORONARY STENT INTERVENTION;  Surgeon: Wellington Hampshire, MD;  Location: [place] [facility];  Service: Cardiovascular;  Laterality: N/A;  . CORONARY STENT INTERVENTION N/A 02/08/2018   Procedure: CORONARY STENT INTERVENTION;  Surgeon: Nelva Bush, MD;  Location: [place] [facility];  Service: Cardiovascular;  Laterality: N/A;  . CORONARY/GRAFT ACUTE MI REVASCULARIZATION N/A 12/19/2017   Procedure: Coronary/Graft Acute MI Revascularization;  Surgeon: Wellington Hampshire, MD;  Location: [place] [facility];  Service: Cardiovascular;  Laterality: N/A;  . ECTOPIC PREGNANCY SURGERY Left   . INTRAVASCULAR ULTRASOUND/IVUS N/A 02/08/2018   Procedure: Intravascular Ultrasound/IVUS;  Surgeon: Nelva Bush, MD;  Location: [place] [facility];  Service: Cardiovascular;  Laterality: N/A;  . LEFT HEART CATH AND CORONARY ANGIOGRAPHY N/A 12/19/2017   Procedure: LEFT HEART CATH AND CORONARY ANGIOGRAPHY;  Surgeon: Wellington Hampshire, MD;  Location: [place] [facility];  Service: Cardiovascular;  Laterality: N/A;  . LEFT HEART CATH AND CORONARY ANGIOGRAPHY N/A 12/26/2017   Procedure: LEFT HEART CATH AND CORONARY ANGIOGRAPHY;  Surgeon: Wellington Hampshire, MD;  Location: [place] [facility];  Service: Cardiovascular;  Laterality: N/A;  . LEFT HEART CATH AND CORONARY ANGIOGRAPHY N/A 02/08/2018   Procedure: LEFT HEART CATH AND CORONARY ANGIOGRAPHY;  Surgeon: Nelva Bush, MD;  Location: [place] [facility];  Service: Cardiovascular;  Laterality: N/A;  . LEFT HEART CATH AND  CORONARY ANGIOGRAPHY N/A 05/22/2018   Procedure: LEFT HEART CATH AND CORONARY ANGIOGRAPHY;  Surgeon: Wellington Hampshire, MD;  Location: [place] [facility];  Service: Cardiovascular;  Laterality: N/A;  . LEFT HEART CATH AND CORONARY ANGIOGRAPHY N/A 06/15/2018   Procedure: LEFT HEART CATH AND CORONARY ANGIOGRAPHY;  Surgeon: Wellington Hampshire, MD;  Location: [place] [facility];  Service: Cardiovascular;  Laterality: N/A;  . LYMPHADENECTOMY    . SPLENECTOMY, PARTIAL    . vulvulasectomy       Home Medications:  Prior to Admission medications   Medication Sig Start Date End Date Taking? Authorizing Provider  acetaminophen (TYLENOL) 325 MG tablet Take 650 mg by mouth every 6 (six) hours as needed for mild pain.    Yes [provider]  albuterol (PROAIR HFA) 108 (90 Base) MCG/ACT inhaler Inhale 1 puff into the lungs every 6 (six) hours as needed for wheezing or shortness of breath.    Yes [provider]  aspirin 81 MG tablet Take 81 mg by mouth daily.  Yes [provider]  atorvastatin (LIPITOR) 80 MG tablet Take 1 tablet (80 mg total) by mouth daily at 6 PM. 01/10/18 03/25/19 Yes Theora Gianotti, NP  citalopram (CELEXA) 20 MG tablet Take 1 tablet (20 mg total) by mouth daily. 03/29/18  Yes Karamalegos, Devonne Doughty, DO  cyclobenzaprine (FLEXERIL) 5 MG tablet Take 1 tablet (5 mg total) by mouth 3 (three) times daily as needed for muscle spasms. 06/16/18  Yes Gouru, Illene Silver, MD  esomeprazole (NEXIUM) 40 MG capsule Take 40 mg by mouth daily.    Yes [provider]  insulin aspart (NOVOLOG) 100 UNIT/ML injection Inject 25 Units into the skin 3 (three) times daily before meals. Take 10 units PLUS sliding scale Patient taking differently: Inject 35 Units into the skin 3 (three) times daily before meals. Take 10 units PLUS sliding scale  05/29/18 07/27/18 Yes Mody, Sital, MD  insulin glargine (LANTUS) 100 UNIT/ML injection Inject 0.55 mLs (55 Units total) into the skin  2 (two) times daily. Patient taking differently: Inject 60 Units into the skin 2 (two) times daily.  06/16/18 07/27/18 Yes Gouru, Illene Silver, MD  isosorbide mononitrate (IMDUR) 30 MG 24 hr tablet Take 1 tablet (30 mg total) by mouth daily. 07/13/18 10/11/18 Yes Dunn, Areta Haber, PA-C  ketoconazole (NIZORAL) 2 % cream Apply 1 application topically daily. 07/12/18  Yes Chrystal, Eulas Post, MD  LORazepam (ATIVAN) 1 MG tablet Take 1 tablet (1 mg total) by mouth 2 (two) times daily as needed for anxiety. 05/31/18  Yes Karamalegos, Devonne Doughty, DO  mirtazapine (REMERON) 15 MG tablet Take 15 mg by mouth at bedtime. 07/07/18  Yes [provider]  nitroGLYCERIN (NITROSTAT) 0.4 MG SL tablet Place 1 tablet (0.4 mg total) under the tongue every 5 (five) minutes as needed for chest pain. 06/16/18  Yes Gouru, Illene Silver, MD  nystatin (MYCOSTATIN/NYSTOP) powder Apply topically 3 (three) times daily. 07/05/18  Yes Secord, Venida Jarvis, MD  ondansetron (ZOFRAN ODT) 4 MG disintegrating tablet Take 1 tablet (4 mg total) by mouth every 8 (eight) hours as needed for nausea or vomiting. 06/26/18  Yes Lavonia Drafts, MD  promethazine (PHENERGAN) 25 MG tablet Take 25 mg by mouth every 6 (six) hours as needed for nausea. for nausea 05/15/18  Yes [provider]  ranolazine (RANEXA) 1000 MG SR tablet Take 1 tablet (1,000 mg total) by mouth 2 (two) times daily. 06/16/18  Yes Gouru, Illene Silver, MD  ticagrelor (BRILINTA) 90 MG TABS tablet Take 90 mg by mouth 2 (two) times daily.   Yes [provider]    Inpatient Medications: Scheduled Meds: . aspirin EC  81 mg Oral Daily  . atorvastatin  80 mg Oral q1800  . [START ON 07/28/2018] citalopram  20 mg Oral Daily  . heparin injection (subcutaneous)  5,000 Units Subcutaneous Q8H  . insulin aspart  0-5 Units Subcutaneous QHS  . insulin aspart  0-9 Units Subcutaneous TID WC  . insulin aspart  10 Units Subcutaneous TID AC  . insulin glargine  40 Units Subcutaneous BID  . isosorbide  mononitrate  30 mg Oral Daily  . mirtazapine  15 mg Oral QHS  . [START ON 07/28/2018] pantoprazole  40 mg Oral Daily  . ranolazine  1,000 mg Oral BID  . ticagrelor  90 mg Oral BID   Continuous Infusions:  PRN Meds: acetaminophen, albuterol, ALPRAZolam, cyclobenzaprine, LORazepam, morphine injection, nitroGLYCERIN, ondansetron, oxyCODONE-acetaminophen, promethazine  Allergies:    Allergies  Allergen Reactions  . Bee Venom Itching, Shortness Of Breath and  Swelling  . Metformin And Related Shortness Of Breath and Swelling  . Darvon [Propoxyphene] Itching  . Gabapentin Swelling  . Nsaids Other (See Comments)    Ulcers   . Tramadol Hives  . Contrast Media [Iodinated Diagnostic Agents] Rash    If you benadryl, and steroids she is able to take the contrast per pt    Social History:   Social History   Socioeconomic History  . Marital status: Widowed    Spouse name: Not on file  . Number of children: 4  . Years of education: Not on file  . Highest education level: Not on file  Occupational History  . Occupation: disabled  Social Needs  . Financial resource strain: Not very hard  . Food insecurity:    Worry: Never true    Inability: Never true  . Transportation needs:    Medical: No    Non-medical: No  Tobacco Use  . Smoking status: Former Smoker    Packs/day: 0.50    Years: 30.00    Pack years: 15.00    Types: Cigarettes    Last attempt to quit: 12/19/2017    Years since quitting: 0.6  . Smokeless tobacco: Never Used  . Tobacco comment: quit 12/19/2017.  Substance and Sexual Activity  . Alcohol use: No  . Drug use: No  . Sexual activity: Not Currently  Lifestyle  . Physical activity:    Days per week: 7 days    Minutes per session: 30 min  . Stress: Very much  Relationships  . Social connections:    Talks on phone: More than three times a week    Gets together: Patient refused    Attends religious service: Never    Active member of club or organization: No     Attends meetings of clubs or organizations: Never    Relationship status: Widowed  . Intimate partner violence:    Fear of current or ex partner: No    Emotionally abused: No    Physically abused: No    Forced sexual activity: No  Other Topics Concern  . Not on file  Social History Narrative   Lives in [place] by herself.  Does not routinely exercise.    Family History:    Family History  Problem Relation Age of Onset  . Non-Hodgkin's lymphoma Mother   . Osteoporosis Mother   . Lupus Sister   . Heart disease Brother   . Heart attack Brother   . Heart attack Father      ROS:  Please see the history of present illness.  Review of Systems  Constitutional: Positive for malaise/fatigue.  Respiratory: Positive for shortness of breath. Negative for cough, hemoptysis and wheezing.   Cardiovascular: Positive for chest pain. Negative for palpitations, orthopnea and leg swelling.  Gastrointestinal: Positive for nausea. Negative for abdominal pain, blood in stool, melena and vomiting.  Musculoskeletal: Positive for back pain and myalgias. Negative for falls.       T5-T12 tender along spine and with palpation, aggravation of CP.  Lower back pain reported "I think I have a kidney infection too"  Neurological: Positive for weakness.  Psychiatric/Behavioral: The patient is nervous/anxious.   All other systems reviewed and are negative.   All other ROS reviewed and negative.     Physical Exam/Data:   Vitals:   07/27/18 0900 07/27/18 1415 07/27/18 1423 07/27/18 1500  BP: 110/65 (!) 118/55 (!) 99/59 (!) 104/56  Pulse: 67 69  61  Resp: 18 14  20  Temp:      TempSrc:      SpO2: 95% 95% 94% 93%  Weight:      Height:        Intake/Output Summary (Last 24 hours) at 07/27/2018 1553 Last data filed at 07/27/2018 1042 Gross per 24 hour  Intake 1000 ml  Output -  Net 1000 ml   Filed Weights   07/27/18 0525  Weight: 61 kg   Body mass index is 25.41 kg/m.  General:  Well  nourished, well developed, in no acute distress. Lying in bed HEENT: normal Neck: no JVD Vascular: No carotid bruits; radial pulses 2+ bilaterally Cardiac:  normal S1, S2; RRR; no murmur  Lungs:  clear to auscultation bilaterally, no wheezing, rhonchi or rales  Abd: soft, nontender, no hepatomegaly  Ext: no lower extremity edema Musculoskeletal:  No deformities. Thoracic region TTP along the spine from ~T5-T12 Skin: warm and dry  Neuro: no focal abnormalities noted Psych:  Normal affect   EKG:  The EKG was personally reviewed and demonstrates:  SR, 71bpm, no acute changes Telemetry:  Telemetry was personally reviewed and demonstrates:  SR  Relevant CV Studies:  [facility] 06/15/2018  Ost 2nd Diag to 2nd Diag lesion is 90% stenosed.  Non-stenotic Prox LAD lesion was previously treated.  Non-stenotic Mid LAD to Dist LAD lesion was previously treated.  Non-stenotic Mid LAD lesion.  Balloon angioplasty was performed.  Prox Cx lesion is 30% stenosed.  Prox RCA lesion is 90% stenosed.  There is mild left ventricular systolic dysfunction.  LV end diastolic pressure is normal.  The left ventricular ejection fraction is 45-50% by visual estimate.  Ost 2nd Mrg to 2nd Mrg lesion is 100% stenosed. 1.  Widely patent LAD stent with no significant restenosis.  Stable 90% stenosis in the nondominant small right coronary artery not different from before.  Also there is a small OM2 which is subtotally occluded proximally.  This is also unchanged from recent cardiac catheterization and catheterization in August.  2.  Mildly reduced LV systolic function with an EF of 45 to 50% with anterior wall hypokinesis.  Normal left ventricular end-diastolic pressure. Recommendations: Continue medical therapy. No clear culprit for the patient's symptoms other than known small vessel disease.     Echo 03/2018: Study Conclusions - Left ventricle: The cavity size was normal. Systolic function was mildly  reduced. The estimated ejection fraction was in the range of 45% to 50%. Hypokinesis of the anteroseptal myocardium. Hypokinesis of the anterior myocardium. Left ventricular diastolic function parameters were normal. - Mitral valve: There was mild regurgitation. - Left atrium: The atrium was normal in size. - Right ventricle: Systolic function was normal. - Pulmonary arteries: Systolic pressure could not be accurately estimated.  Impressions:  - Compared to previous study, EF has improved. __________  [facility] 05/22/2018: Conclusion    Ost 2nd Diag to 2nd Diag lesion is 90% stenosed.  Previously placed Prox LAD drug eluting stent is widely patent.  Prox Cx lesion is 30% stenosed.  Prox RCA lesion is 90% stenosed.  Mid LAD lesion is 95% stenosed.  Previously placed Mid LAD to Dist LAD stent (unknown type) is widely patent.  Post intervention, there is a 0% residual stenosis.  Balloon angioplasty was performed using a BALLOON [place] RX3.0X15.  LV end diastolic pressure is normal.  1. Significant underlying three-vessel coronary artery disease with patent LAD stents except in the mid to distal segment where there is 95% stenosis that appears to be  a combination of restenosis and possible localized thrombosis (given that that lesion was soft to dilation). Stable proximal RCA disease in a nondominant vessel. 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  2. Normal left ventricular end-diastolic pressure. Left ventricular angiography was not performed. 3. Successful balloon angioplasty to the mid LAD with noncompliant balloon high pressure.  Recommendations: Continue indefinite dual antiplatelet therapy. Aggressive treatment of risk factors. Likely discharge home tomorrow.       Laboratory Data:  Chemistry Recent Labs  Lab 07/27/18 0527  NA 130*  K 5.0  CL 96*  CO2 25  GLUCOSE 478*  BUN 22*    CREATININE 0.77  CALCIUM 9.6  GFRNONAA >60  GFRAA >60  ANIONGAP 9    No results for input(s): PROT, ALBUMIN, AST, ALT, ALKPHOS, BILITOT in the last 168 hours. Hematology Recent Labs  Lab 07/27/18 0527  WBC 9.8  RBC 4.95  HGB 15.6*  HCT 46.2*  MCV 93.3  MCH 31.5  MCHC 33.8  RDW 13.2  PLT 284   Cardiac Enzymes Recent Labs  Lab 07/27/18 0527 07/27/18 0830  TROPONINI <0.03 <0.03   No results for input(s): TROPIPOC in the last 168 hours.  BNPNo results for input(s): BNP, PROBNP in the last 168 hours.  DDimer No results for input(s): DDIMER in the last 168 hours.  Radiology/Studies:  Dg Chest Port 1 View  Result Date: 07/27/2018 CLINICAL DATA:  Chest pain EXAM: PORTABLE CHEST 1 VIEW COMPARISON:  06/13/2018 FINDINGS: Normal heart size and mediastinal contours. Noted coronary stent. No acute infiltrate or edema. No effusion or pneumothorax. No acute osseous findings. IMPRESSION: No active disease. Electronically Signed   By: Monte Fantasia M.D.   On: 07/27/2018 05:54    Assessment and Plan:   Chest Pain with h/o CAD  - S/p recent cath 05/2018 with cardiac history as above in HPI.  - Current and chronic atypical chest pain that radiates from chest to back. While chest not TTP on exam, she is TTP along the spine and from T5-T12, which reproduces and aggravates her chest pain on exam. Muscle tension noted. Suspicion that etiology of chest pain may be ongoing known spinal issues, including patient reported spinal stenosis. Low suspicion for cardiac etiology atypical chest pain that is reproduced by palpating along spine, recent cardiac catheterization performed 05/2018 as above, cardiac enzymes negative, and EKG without acute changes. Troponin cycling and negative x3.  - Rules out for ACS. No recommendation for further cardiac workup this admission. Continue medical management with PTA medications of brilinta, ranexa, imdur, lipitor, asa. Not on a BB d/t low BP.  Repeat troponin cycle  was ordered in the ED and will continue to monitor. Continue aggressive risk factor modification, including monitoring sugars and not smoking. Zofran as needed for continued nausea.  Continue outpatient follow-up with [facility] as scheduled. - Due to back pain on exam reproducing atypical CP, recommendation for patient follow-up with pain management (Dr. Ashley Mariner, MD) and Physiatry (Dr. Dr. Loletha Grayer. Chasnis DO).  Flexeril, dilaudid, and morphine have been ordered as needed for pain while in the ED.  Hyperkalemia - K 5.0. Check Mg, Daily BMET. Cr 0.77.  - Consider addition of low dose spironolactone now or as outpatient with follow-up BMET given elevated potassium.   HFrEF - Last echo 03/2018. EF 45% to 50%. Hypokinesis of the anteroseptal myocardium and anterior myocardium. Continue medical management as above. Consider addition of spironolactone as above.  HLD - LDL 118 07/2018 and not at goal. Increased from last LDL with patient reporting medication compliance. Consider follow-up in the lipid clinic for optimization of lipids and for appropriate risk factor management given history of CAD above.    DM2 - Pending A1C recheck, ordered in the ED.  Recommend outpatient follow-up and control of sugars for risk factor modification. Lifestyle changes recommended, including diet and exercise.    For questions or updates, please contact [place] Please consult [email or web address] for contact info under     Signed, Arvil Chaco, PA-C  07/27/2018 3:53 PM

## 2018-07-27 NOTE — Progress Notes (Signed)
Family Meeting Note  Advance Directive:yes  Today a meeting took place with the Patient.   The following clinical team members were present during this meeting:MD  The following were discussed:Patient's diagnosis: Coronary artery disease, angina, hypertension, diabetes, Patient's progosis: Unable to determine and Goals for treatment: DNR  Patient made it clear to me that in the event where her heart stops she would not like to be having resuscitation or putting on the life support.  Additional follow-up to be provided: cardiology  Time spent during discussion:20 minutes  Vaughan Basta, MD

## 2018-07-27 NOTE — ED Notes (Signed)
Resumed care from [place], [place]. Pt called out, stating her pain in back and chest have increased to 8/10. She states prior medications reduced it only to 6/10. MD notified.

## 2018-07-27 NOTE — H&P (Signed)
[facility] [place] NAME: Laxmi Choung    MR#:  [id number]  DATE OF BIRTH:  05-Nov-1965  DATE OF ADMISSION:  07/27/2018  PRIMARY CARE PHYSICIAN: Olin Hauser, DO   REQUESTING/REFERRING PHYSICIAN: Joni Fears  CHIEF COMPLAINT:   Chief Complaint  Patient presents with  . Chest Pain    HISTORY OF PRESENT ILLNESS: Zeniya Lapidus  is a 53 y.o. female with a known history of oral cancer, saliva cold disc disease, chronic combined systolic and diastolic heart failure, coronary artery disease, COPD, diabetes, gastroesophageal reflux disease, hypertension, pancreatitis, shingles-had recurrent admissions for chest pain related issues.  Last catheterization was done 2 months ago. Today suddenly started having chest pain which was central and radiating to her left shoulder and arm associated with some shortness of breath so came to emergency room with this.  She took 3 nitroglycerin tablets and one aspirin tablet at home with minimal relief. In ER her EKG and troponin were not suggestive of acute coronary syndrome.  Due to her significant cardiac history ER physician spoke to her primary cardiologist Dr. Doristine Section suggested to keep the patient on observation for further cardiac work-up.  PAST MEDICAL HISTORY:   Past Medical History:  Diagnosis Date  . Cancer ([place])    vulvular  . Cervical disc disease    a. 01/2018 MRI Cervical spine: Cervical spondylosis with multilevel disc and facet degeneration greatest at C5-6 and C6-7.  Moderate to sev R C5-6, mod L C5-6, and mod bilat C6-7 foraminal stenosis with multilevel mild foraminal stenosis.  Mild C5-6 and mod C6-7 canal stenosis. C6-7 central cord impingement w/ cord flattening.  . Chronic combined systolic (congestive) and diastolic (congestive) heart failure ([place])    a. 12/2017 Echo: EF 30-35%, mid-apicalanteroseptal, ant, and apical AK. Gr1 DD. Mild conc LVH; b. 03/2018 Echo: EF 45-50%, antsept, ant HK.  Mild MR. Nl LA size. Nl RV fxn.  Marland Kitchen COPD (chronic obstructive pulmonary disease) (H[facility])    not on home oxygen  . Coronary artery disease    a. 12/2017 ACS/PCI: LAD 100p (2.25x26 Resolute Onyx DES), 30m (2.0x12 Resolute Onyx DES), 80d (2.0x15 Resolute Onyx DES), RCA 90p (non-dominant). EF 25-35%. Post-MI course complicated by CGS; b. 08/5359 NSTEMI/subacute thrombosis-->LAD 100 (PTCA + DES x 1); c. 01/2018 NSTEMI/PCI: LM min irregs, LAD 50-60p ISR/hazy (3.5x12 Resolute DES), 57m/d (underexpansion of prior stents-->PTCA). EF 45-50%.  . Diabetes mellitus without complication ([place])   . GERD (gastroesophageal reflux disease)   . H/O degenerative disc disease   . Hypotension   . Ischemic cardiomyopathy    a. 12/2017 Echo: EF 30-35%; b. 03/2018 Echo: EF 45-50%.  . Myocardial infarction ([place])    a. 12/2017-->DES to LAD x 3.  . Pancreatitis   . Shingles   . Spinal stenosis   . Tobacco abuse   . Ulcer (traumatic) of oral mucosa   . Urinary incontinence   . VIN III (vulvar intraepithelial neoplasia III)     PAST SURGICAL HISTORY:  Past Surgical History:  Procedure Laterality Date  . ABDOMINAL HYSTERECTOMY     partial  . CORONARY BALLOON ANGIOPLASTY N/A 05/22/2018   Procedure: CORONARY BALLOON ANGIOPLASTY;  Surgeon: Wellington Hampshire, MD;  Location: [facility];  Service: Cardiovascular;  Laterality: N/A;  . CORONARY STENT INTERVENTION N/A 12/26/2017   Procedure: CORONARY STENT INTERVENTION;  Surgeon: Wellington Hampshire, MD;  Location: [facility];  Service: Cardiovascular;  Laterality: N/A;  . CORONARY STENT INTERVENTION  N/A 02/08/2018   Procedure: CORONARY STENT INTERVENTION;  Surgeon: Nelva Bush, MD;  Location: [facility];  Service: Cardiovascular;  Laterality: N/A;  . CORONARY/GRAFT ACUTE MI REVASCULARIZATION N/A 12/19/2017   Procedure: Coronary/Graft Acute MI Revascularization;  Surgeon: Wellington Hampshire, MD;  Location: [facility];  Service: Cardiovascular;   Laterality: N/A;  . ECTOPIC PREGNANCY SURGERY Left   . INTRAVASCULAR ULTRASOUND/IVUS N/A 02/08/2018   Procedure: Intravascular Ultrasound/IVUS;  Surgeon: Nelva Bush, MD;  Location: [facility];  Service: Cardiovascular;  Laterality: N/A;  . LEFT HEART CATH AND CORONARY ANGIOGRAPHY N/A 12/19/2017   Procedure: LEFT HEART CATH AND CORONARY ANGIOGRAPHY;  Surgeon: Wellington Hampshire, MD;  Location: [facility];  Service: Cardiovascular;  Laterality: N/A;  . LEFT HEART CATH AND CORONARY ANGIOGRAPHY N/A 12/26/2017   Procedure: LEFT HEART CATH AND CORONARY ANGIOGRAPHY;  Surgeon: Wellington Hampshire, MD;  Location: [facility];  Service: Cardiovascular;  Laterality: N/A;  . LEFT HEART CATH AND CORONARY ANGIOGRAPHY N/A 02/08/2018   Procedure: LEFT HEART CATH AND CORONARY ANGIOGRAPHY;  Surgeon: Nelva Bush, MD;  Location: [facility];  Service: Cardiovascular;  Laterality: N/A;  . LEFT HEART CATH AND CORONARY ANGIOGRAPHY N/A 05/22/2018   Procedure: LEFT HEART CATH AND CORONARY ANGIOGRAPHY;  Surgeon: Wellington Hampshire, MD;  Location: [facility];  Service: Cardiovascular;  Laterality: N/A;  . LEFT HEART CATH AND CORONARY ANGIOGRAPHY N/A 06/15/2018   Procedure: LEFT HEART CATH AND CORONARY ANGIOGRAPHY;  Surgeon: Wellington Hampshire, MD;  Location: [facility];  Service: Cardiovascular;  Laterality: N/A;  . LYMPHADENECTOMY    . SPLENECTOMY, PARTIAL    . vulvulasectomy      SOCIAL HISTORY:  Social History   Tobacco Use  . Smoking status: Former Smoker    Packs/day: 0.50    Years: 30.00    Pack years: 15.00    Types: Cigarettes    Last attempt to quit: 12/19/2017    Years since quitting: 0.6  . Smokeless tobacco: Never Used  . Tobacco comment: quit 12/19/2017.  Substance Use Topics  . Alcohol use: No    FAMILY HISTORY:  Family History  Problem Relation Age of Onset  . Non-Hodgkin's lymphoma Mother   . Osteoporosis Mother   . Lupus Sister   .  Heart disease Brother   . Heart attack Brother   . Heart attack Father     DRUG ALLERGIES:  Allergies  Allergen Reactions  . Bee Venom Itching, Shortness Of Breath and Swelling  . Metformin And Related Shortness Of Breath and Swelling  . Darvon [Propoxyphene] Itching  . Gabapentin Swelling  . Nsaids Other (See Comments)    Ulcers   . Tramadol Hives  . Contrast Media [Iodinated Diagnostic Agents] Rash    If you benadryl, and steroids she is able to take the contrast per pt    REVIEW OF SYSTEMS:   CONSTITUTIONAL: No fever, fatigue or weakness.  EYES: No blurred or double vision.  EARS, NOSE, AND THROAT: No tinnitus or ear pain.  RESPIRATORY: No cough, shortness of breath, wheezing or hemoptysis.  CARDIOVASCULAR: She has chest pain, no orthopnea, edema.  GASTROINTESTINAL: No nausea, vomiting, diarrhea or abdominal pain.  GENITOURINARY: No dysuria, hematuria.  ENDOCRINE: No polyuria, nocturia,  HEMATOLOGY: No anemia, easy bruising or bleeding SKIN: No rash or lesion. MUSCULOSKELETAL: No joint pain or arthritis.   NEUROLOGIC: No tingling, numbness, weakness.  PSYCHIATRY: No anxiety or depression.  MEDICATIONS AT HOME:  Prior to Admission medications   Medication Sig Start Date End Date Taking? Authorizing Provider  acetaminophen (TYLENOL) 325 MG tablet Take 650 mg by mouth every 6 (six) hours as needed for mild pain.    Yes [provider]  albuterol (PROAIR HFA) 108 (90 Base) MCG/ACT inhaler Inhale 1 puff into the lungs every 6 (six) hours as needed for wheezing or shortness of breath.    Yes [provider]  aspirin 81 MG tablet Take 81 mg by mouth daily.   Yes [provider]  atorvastatin (LIPITOR) 80 MG tablet Take 1 tablet (80 mg total) by mouth daily at 6 PM. 01/10/18 03/25/19 Yes Theora Gianotti, NP  citalopram (CELEXA) 20 MG tablet Take 1 tablet (20 mg total) by mouth daily. 03/29/18  Yes Karamalegos, Devonne Doughty, DO  cyclobenzaprine  (FLEXERIL) 5 MG tablet Take 1 tablet (5 mg total) by mouth 3 (three) times daily as needed for muscle spasms. 06/16/18  Yes Gouru, Illene Silver, MD  esomeprazole (NEXIUM) 40 MG capsule Take 40 mg by mouth daily.    Yes [provider]  insulin aspart (NOVOLOG) 100 UNIT/ML injection Inject 25 Units into the skin 3 (three) times daily before meals. Take 10 units PLUS sliding scale Patient taking differently: Inject 35 Units into the skin 3 (three) times daily before meals. Take 10 units PLUS sliding scale  05/29/18 07/27/18 Yes Mody, Sital, MD  insulin glargine (LANTUS) 100 UNIT/ML injection Inject 0.55 mLs (55 Units total) into the skin 2 (two) times daily. Patient taking differently: Inject 60 Units into the skin 2 (two) times daily.  06/16/18 07/27/18 Yes Gouru, Illene Silver, MD  isosorbide mononitrate (IMDUR) 30 MG 24 hr tablet Take 1 tablet (30 mg total) by mouth daily. 07/13/18 10/11/18 Yes Dunn, Areta Haber, PA-C  ketoconazole (NIZORAL) 2 % cream Apply 1 application topically daily. 07/12/18  Yes Chrystal, Eulas Post, MD  LORazepam (ATIVAN) 1 MG tablet Take 1 tablet (1 mg total) by mouth 2 (two) times daily as needed for anxiety. 05/31/18  Yes Karamalegos, Devonne Doughty, DO  mirtazapine (REMERON) 15 MG tablet Take 15 mg by mouth at bedtime. 07/07/18  Yes [provider]  nitroGLYCERIN (NITROSTAT) 0.4 MG SL tablet Place 1 tablet (0.4 mg total) under the tongue every 5 (five) minutes as needed for chest pain. 06/16/18  Yes Gouru, Illene Silver, MD  nystatin (MYCOSTATIN/NYSTOP) powder Apply topically 3 (three) times daily. 07/05/18  Yes Secord, Venida Jarvis, MD  ondansetron (ZOFRAN ODT) 4 MG disintegrating tablet Take 1 tablet (4 mg total) by mouth every 8 (eight) hours as needed for nausea or vomiting. 06/26/18  Yes Lavonia Drafts, MD  promethazine (PHENERGAN) 25 MG tablet Take 25 mg by mouth every 6 (six) hours as needed for nausea. for nausea 05/15/18  Yes [provider]  ranolazine (RANEXA) 1000 MG SR tablet  Take 1 tablet (1,000 mg total) by mouth 2 (two) times daily. 06/16/18  Yes Gouru, Illene Silver, MD  ticagrelor (BRILINTA) 90 MG TABS tablet Take 90 mg by mouth 2 (two) times daily.   Yes [provider]      PHYSICAL EXAMINATION:   VITAL SIGNS: Blood pressure (!) 99/59, pulse 69, temperature 97.6 F (36.4 C), temperature source Oral, resp. rate 14, height 5\' 1"  (1.549 m), weight 61 kg, SpO2 94 %.  GENERAL:  53 y.o.-year-old patient lying in the bed with no acute distress.  EYES: Pupils equal, round, reactive to light and accommodation. No scleral icterus. Extraocular muscles intact.  HEENT: Head atraumatic, normocephalic. Oropharynx and nasopharynx clear.  NECK:  Supple, no jugular venous distention. No thyroid enlargement, no tenderness.  LUNGS: Normal breath sounds bilaterally, no wheezing, rales,rhonchi or crepitation. No use of accessory muscles of respiration.  CARDIOVASCULAR: S1, S2 normal. No murmurs, rubs, or gallops.  ABDOMEN: Soft, nontender, nondistended. Bowel sounds present. No organomegaly or mass.  EXTREMITIES: No pedal edema, cyanosis, or clubbing.  NEUROLOGIC: Cranial nerves II through XII are intact. Muscle strength 5/5 in all extremities. Sensation intact. Gait not checked.  PSYCHIATRIC: The patient is alert and oriented x 3.  SKIN: No obvious rash, lesion, or ulcer.   LABORATORY PANEL:   CBC Recent Labs  Lab 07/27/18 0527  WBC 9.8  HGB 15.6*  HCT 46.2*  PLT 284  MCV 93.3  MCH 31.5  MCHC 33.8  RDW 13.2   ------------------------------------------------------------------------------------------------------------------  Chemistries  Recent Labs  Lab 07/27/18 0527  NA 130*  K 5.0  CL 96*  CO2 25  GLUCOSE 478*  BUN 22*  CREATININE 0.77  CALCIUM 9.6   ------------------------------------------------------------------------------------------------------------------ estimated creatinine clearance is 67.4 mL/min (by C-G formula based on SCr of 0.77  mg/dL). ------------------------------------------------------------------------------------------------------------------ No results for input(s): TSH, T4TOTAL, T3FREE, THYROIDAB in the last 72 hours.  Invalid input(s): FREET3   Coagulation profile No results for input(s): INR, PROTIME in the last 168 hours. ------------------------------------------------------------------------------------------------------------------- No results for input(s): DDIMER in the last 72 hours. -------------------------------------------------------------------------------------------------------------------  Cardiac Enzymes Recent Labs  Lab 07/27/18 0527 07/27/18 0830  TROPONINI <0.03 <0.03   ------------------------------------------------------------------------------------------------------------------ Invalid input(s): POCBNP  ---------------------------------------------------------------------------------------------------------------  Urinalysis    Component Value Date/Time   COLORURINE COLORLESS (A) 07/17/2018 0715   APPEARANCEUR CLEAR (A) 07/17/2018 0715   APPEARANCEUR Hazy 04/19/2014 0925   LABSPEC 1.026 07/17/2018 0715   LABSPEC 1.006 04/19/2014 0925   PHURINE 6.0 07/17/2018 0715   GLUCOSEU >=500 (A) 07/17/2018 0715   GLUCOSEU >=500 04/19/2014 0925   HGBUR NEGATIVE 07/17/2018 0715   BILIRUBINUR NEGATIVE 07/17/2018 0715   BILIRUBINUR Negative 04/19/2014 0925   KETONESUR 5 (A) 07/17/2018 0715   PROTEINUR NEGATIVE 07/17/2018 0715   NITRITE NEGATIVE 07/17/2018 0715   LEUKOCYTESUR NEGATIVE 07/17/2018 0715   LEUKOCYTESUR 3+ 04/19/2014 0925     RADIOLOGY: Dg Chest Port 1 View  Result Date: 07/27/2018 CLINICAL DATA:  Chest pain EXAM: PORTABLE CHEST 1 VIEW COMPARISON:  06/13/2018 FINDINGS: Normal heart size and mediastinal contours. Noted coronary stent. No acute infiltrate or edema. No effusion or pneumothorax. No acute osseous findings. IMPRESSION: No active disease. Electronically  Signed   By: Monte Fantasia M.D.   On: 07/27/2018 05:54    EKG: Orders placed or performed during the hospital encounter of 07/27/18  . EKG 12-Lead  . EKG 12-Lead  . ED EKG  . ED EKG  . Repeat EKG  . Repeat EKG  . EKG 12-Lead  . EKG 12-Lead    IMPRESSION AND PLAN:  *Anginal chest pain-history of coronary artery disease Keep on telemetry monitoring follow serial troponin. Cardiology consult. Continue home cardiac meds.  *Diabetes mellitus Keep home insulin at decreased dose and keep on sliding scale coverage.  *Hyperlipidemia Continue atorvastatin.  *Depression Continue home medications.  *COPD Currently no active wheezing, continue inhalers as needed.  All the records are reviewed and case discussed with ED provider. Management plans discussed with the patient, family and they are in agreement.  CODE STATUS: DNR    Code Status Orders  (From admission, onward)         Start  Ordered   07/27/18 1239  Do not attempt resuscitation (DNR)  Continuous    Question Answer Comment  In the event of cardiac or respiratory ARREST Do not call a "code blue"   In the event of cardiac or respiratory ARREST Do not perform Intubation, CPR, defibrillation or ACLS   In the event of cardiac or respiratory ARREST Use medication by any route, position, wound care, and other measures to relive pain and suffering. May use oxygen, suction and manual treatment of airway obstruction as needed for comfort.      07/27/18 1238        Code Status History    Date Active Date Inactive Code Status Order ID Comments User Context   06/13/2018 1050 06/16/2018 1805 Full Code [id number]  Nicholes Mango, MD ED   05/28/2018 1341 05/29/2018 1751 Full Code [id number]  Sela Hua, MD Inpatient   05/22/2018 0607 05/23/2018 1837 Full Code [id number]  Harrie Foreman, MD ED   03/24/2018 1305 03/25/2018 1821 Full Code [id number]  Hillary Bow, MD ED   02/07/2018 0630 02/09/2018 1532 Full Code [id number]   Arta Silence, MD ED   01/26/2018 0811 01/28/2018 1803 Full Code [id number]  Harrie Foreman, MD Inpatient   01/15/2018 1342 01/17/2018 1617 Full Code [id number]  Demetrios Loll, MD Inpatient   12/26/2017 1538 12/30/2017 1928 Full Code [id number]  Wellington Hampshire, MD Inpatient   12/26/2017 1537 12/26/2017 1538 Full Code [id number]  Vaughan Basta, MD Inpatient   12/19/2017 0839 12/21/2017 1940 Full Code [id number]  Wellington Hampshire, MD Inpatient   11/03/2017 1031 11/04/2017 1850 Full Code [id number]  Fritzi Mandes, MD Inpatient   09/28/2017 1044 09/29/2017 2121 Full Code [id number]  Harrie Foreman, MD ED   07/17/2017 2004 07/20/2017 1840 Full Code [id number]  Henreitta Leber, MD ED   06/12/2017 2227 06/16/2017 1910 Full Code [id number]  Gorden Harms, MD Inpatient   12/28/2014 0305 12/28/2014 1919 Full Code [id number]  Lance Coon, MD Inpatient       TOTAL TIME TAKING CARE OF THIS PATIENT: 50 minutes.    Vaughan Basta M.D on 07/27/2018   Between 7am to 6pm - Pager - [phone]  After 6pm go to [email or web address] - password [email or web address] [facility]  Office  [phone]  CC: Primary care physician; Olin Hauser, DO   Note: This dictation was prepared with Dragon dictation along with smaller phrase technology. Any transcriptional errors that result from this process are unintentional.

## 2018-07-28 DIAGNOSIS — I2510 Atherosclerotic heart disease of native coronary artery without angina pectoris: Secondary | ICD-10-CM | POA: Diagnosis not present

## 2018-07-28 DIAGNOSIS — R079 Chest pain, unspecified: Secondary | ICD-10-CM

## 2018-07-28 DIAGNOSIS — E785 Hyperlipidemia, unspecified: Secondary | ICD-10-CM | POA: Diagnosis not present

## 2018-07-28 LAB — GLUCOSE, CAPILLARY
GLUCOSE-CAPILLARY: 336 mg/dL — AB (ref 70–99)
Glucose-Capillary: 255 mg/dL — ABNORMAL HIGH (ref 70–99)

## 2018-07-28 LAB — TROPONIN I: Troponin I: <0.03 ng/mL (ref <0.03)

## 2018-07-28 MED ORDER — OXYCODONE-ACETAMINOPHEN 5-325 MG PO TABS: 1.0000 | ORAL_TABLET | Freq: Four times a day (QID) | ORAL | 0 refills | Status: DC | PRN

## 2018-07-28 MED ORDER — EZETIMIBE 10 MG PO TABS
10.0000 mg | ORAL_TABLET | Freq: Every day | ORAL | Status: DC
  Administered 2018-07-28: 10 mg via ORAL
  Filled 2018-07-28: qty 1

## 2018-07-28 MED ORDER — INSULIN ASPART 100 UNIT/ML ~~LOC~~ SOLN: 0.0000 [IU] | Freq: Every day | SUBCUTANEOUS | Status: DC

## 2018-07-28 MED ORDER — INSULIN ASPART 100 UNIT/ML ~~LOC~~ SOLN: 15.0000 [IU] | Freq: Three times a day (TID) | SUBCUTANEOUS | Status: DC

## 2018-07-28 MED ORDER — INSULIN ASPART 100 UNIT/ML ~~LOC~~ SOLN: 0.0000 [IU] | Freq: Three times a day (TID) | SUBCUTANEOUS | Status: DC

## 2018-07-28 NOTE — Discharge Summary (Signed)
[place] at [facility], 53 y.o., DOB October 04, 1965, MRN [id number]. Admission date: 07/27/2018 Discharge Date 07/28/2018 Primary MD Olin Hauser, DO Admitting Physician Vaughan Basta, MD  Admission Diagnosis  Precordial pain [R07.2] Other chest pain [R07.89]  Discharge Diagnosis   Active Problems: Atypical chest pain felt to be musculoskeletal Diabetes type 2 Hyperlipidemia Depression COPD without [place]  is a 53 y.o. female with a known history of oral cancer, saliva cold disc disease, chronic combined systolic and diastolic heart failure, coronary artery disease, COPD, diabetes, gastroesophageal reflux disease, hypertension, pancreatitis, shingles-had recurrent admissions for chest pain related issues.  Last catheterization was done 2 months ago.  Patient presented with chest pain.  Her cardiac enzymes EKG were all nonrevealing.  She was seen in consultation by cardiology who felt her symptoms are very atypical and can be discharged home.             Consults  cardiology  Significant Tests:  See full reports for all details     Dg Chest Port 1 View  Result Date: 07/27/2018 CLINICAL DATA:  Chest pain EXAM: PORTABLE CHEST 1 VIEW COMPARISON:  06/13/2018 FINDINGS: Normal heart size and mediastinal contours. Noted coronary stent. No acute infiltrate or edema. No effusion or pneumothorax. No acute osseous findings. IMPRESSION: No active disease. Electronically Signed   By: Monte Fantasia M.D.   On: 07/27/2018 05:54   Ct Renal Stone Study  Result Date: 07/17/2018 CLINICAL DATA:  Left flank pain for 4 days EXAM: CT ABDOMEN AND PELVIS WITHOUT CONTRAST TECHNIQUE: Multidetector CT imaging of the abdomen and pelvis was performed following the standard protocol without IV contrast. COMPARISON:  06/26/2018 FINDINGS: Lower chest:  No acute finding Hepatobiliary: No focal liver  abnormality.No evidence of biliary obstruction or stone. Pancreas: Unremarkable. Spleen: Unremarkable. Adrenals/Urinary Tract: Negative adrenals. No hydronephrosis or ureteral stone. Left renal hilar calcification, likely vascular. Unremarkable bladder. Stomach/Bowel: Formed stool throughout the colon without obstruction or bowel inflammation. No appendicitis. Vascular/Lymphatic: Prominent aortic iliac atherosclerotic calcification for age. No mass or adenopathy. Reproductive:Distortion of the vulva that is similar to prior. There is history of vulvar cancer. Other: No ascites or pneumoperitoneum. Incisional midline epigastric hernias containing fat and a small portion of transverse colon. Musculoskeletal: Lumbar facet degeneration.  No acute finding IMPRESSION: 1. No acute finding. 2. Formed stool throughout the colon, question constipation. 3. Chronic, incidental findings are described above. Electronically Signed   By: Monte Fantasia M.D.   On: 07/17/2018 06:42       Today   Subjective:   Renee Mack patient complains of some discomfort in the chest but improved Objective:   Blood pressure (!) 142/68, pulse 70, temperature 97.9 F (36.6 C), temperature source Oral, resp. rate 20, height 5\' 1"  (1.549 m), weight 61.2 kg, SpO2 97 %.  .  Intake/Output Summary (Last 24 hours) at 07/28/2018 1442 Last data filed at 07/28/2018 1300 Gross per 24 hour  Intake 1011 ml  Output 1400 ml  Net -389 ml    Exam VITAL SIGNS: Blood pressure (!) 142/68, pulse 70, temperature 97.9 F (36.6 C), temperature source Oral, resp. rate 20, height 5\' 1"  (1.549 m), weight 61.2 kg, SpO2 97 %.  GENERAL:  53 y.o.-year-old patient lying in the bed with no acute distress.  EYES: Pupils equal, round, reactive to light and accommodation. No scleral icterus. Extraocular muscles intact.  HEENT: Head atraumatic, normocephalic. Oropharynx and nasopharynx clear.  NECK:  Supple, no jugular venous distention. No thyroid  enlargement, no tenderness.  LUNGS: Normal breath sounds bilaterally, no wheezing, rales,rhonchi or crepitation. No use of accessory muscles of respiration.  CARDIOVASCULAR: S1, S2 normal. No murmurs, rubs, or gallops.  ABDOMEN: Soft, nontender, nondistended. Bowel sounds present. No organomegaly or mass.  EXTREMITIES: No pedal edema, cyanosis, or clubbing.  NEUROLOGIC: Cranial nerves II through XII are intact. Muscle strength 5/5 in all extremities. Sensation intact. Gait not checked.  PSYCHIATRIC: The patient is alert and oriented x 3.  SKIN: No obvious rash, lesion, or ulcer.   Data Review     CBC w Diff:  Lab Results  Component Value Date   WBC 9.8 07/27/2018   HGB 15.6 (H) 07/27/2018   HGB 16.1 (H) 04/19/2014   HCT 46.2 (H) 07/27/2018   HCT 48.7 (H) 04/19/2014   PLT 284 07/27/2018   PLT 318 04/19/2014   LYMPHOPCT 27 07/17/2018   MONOPCT 4 07/17/2018   EOSPCT 2 07/17/2018   BASOPCT 0 07/17/2018   CMP:  Lab Results  Component Value Date   NA 130 (L) 07/27/2018   NA 129 (L) 04/19/2014   K 5.0 07/27/2018   K 4.2 04/19/2014   CL 96 (L) 07/27/2018   CL 100 04/19/2014   CO2 25 07/27/2018   CO2 19 (L) 04/19/2014   BUN 22 (H) 07/27/2018   BUN 9 04/19/2014   CREATININE 0.77 07/27/2018   CREATININE 0.71 04/19/2014   PROT 7.4 07/17/2018   PROT 8.6 (H) 04/19/2014   ALBUMIN 3.6 07/17/2018   ALBUMIN 3.8 04/19/2014   BILITOT 0.5 07/17/2018   BILITOT 0.5 04/19/2014   ALKPHOS 105 07/17/2018   ALKPHOS 91 04/19/2014   AST 20 07/17/2018   AST 22 04/19/2014   ALT 25 07/17/2018   ALT 19 04/19/2014  .  Micro Results No results found for this or any previous visit (from the past 240 hour(s)).      Code Status Orders  (From admission, onward)         Start     Ordered   07/27/18 1239  Do not attempt resuscitation (DNR)  Continuous    Question Answer Comment  In the event of cardiac or respiratory ARREST Do not call a "code blue"   In the event of cardiac or  respiratory ARREST Do not perform Intubation, CPR, defibrillation or ACLS   In the event of cardiac or respiratory ARREST Use medication by any route, position, wound care, and other measures to relive pain and suffering. May use oxygen, suction and manual treatment of airway obstruction as needed for comfort.      07/27/18 1238        Code Status History    Date Active Date Inactive Code Status Order ID Comments User Context   06/13/2018 1050 06/16/2018 1805 Full Code [id number]  Nicholes Mango, MD ED   05/28/2018 1341 05/29/2018 1751 Full Code [id number]  Sela Hua, MD Inpatient   05/22/2018 0607 05/23/2018 1837 Full Code [id number]  Harrie Foreman, MD ED   03/24/2018 1305 03/25/2018 1821 Full Code [id number]  Hillary Bow, MD ED   02/07/2018 0630 02/09/2018 1532 Full Code [id number]  Arta Silence, MD ED   01/26/2018 0811 01/28/2018 1803 Full Code [id number]  Harrie Foreman, MD Inpatient   01/15/2018 1342 01/17/2018 1617 Full Code [id number]  Demetrios Loll, MD Inpatient   12/26/2017 1538 12/30/2017 1928 Full Code [id number]  Wellington Hampshire, MD Inpatient   12/26/2017 1537  12/26/2017 1538 Full Code [id number]  Vaughan Basta, MD Inpatient   12/19/2017 0839 12/21/2017 1940 Full Code [id number]  Wellington Hampshire, MD Inpatient   11/03/2017 1031 11/04/2017 1850 Full Code [id number]  Fritzi Mandes, MD Inpatient   09/28/2017 1044 09/29/2017 2121 Full Code [id number]  Harrie Foreman, MD ED   07/17/2017 2004 07/20/2017 1840 Full Code [id number]  Henreitta Leber, MD ED   06/12/2017 2227 06/16/2017 1910 Full Code [id number]  Gorden Harms, MD Inpatient   12/28/2014 0305 12/28/2014 1919 Full Code [id number]  Lance Coon, MD Inpatient            Discharge Medications   Allergies as of 07/28/2018      Reactions   Bee Venom Itching, Shortness Of Breath, Swelling   Metformin And Related Shortness Of Breath, Swelling   Darvon [propoxyphene] Itching   Gabapentin Swelling   Nsaids Other (See Comments)    Ulcers   Tramadol Hives   Contrast Media [iodinated Diagnostic Agents] Rash   If you benadryl, and steroids she is able to take the contrast per pt      Medication List    TAKE these medications   acetaminophen 325 MG tablet Commonly known as:  TYLENOL Take 650 mg by mouth every 6 (six) hours as needed for mild pain.   aspirin 81 MG tablet Take 81 mg by mouth daily.   atorvastatin 80 MG tablet Commonly known as:  LIPITOR Take 1 tablet (80 mg total) by mouth daily at 6 PM.   citalopram 20 MG tablet Commonly known as:  CELEXA Take 1 tablet (20 mg total) by mouth daily.   cyclobenzaprine 5 MG tablet Commonly known as:  FLEXERIL Take 1 tablet (5 mg total) by mouth 3 (three) times daily as needed for muscle spasms.   esomeprazole 40 MG capsule Commonly known as:  NEXIUM Take 40 mg by mouth daily.   insulin aspart 100 UNIT/ML injection Commonly known as:  novoLOG Inject 25 Units into the skin 3 (three) times daily before meals. Take 10 units PLUS sliding scale What changed:  how much to take   insulin glargine 100 UNIT/ML injection Commonly known as:  LANTUS Inject 0.55 mLs (55 Units total) into the skin 2 (two) times daily. What changed:  how much to take   isosorbide mononitrate 30 MG 24 hr tablet Commonly known as:  IMDUR Take 1 tablet (30 mg total) by mouth daily.   ketoconazole 2 % cream Commonly known as:  NIZORAL Apply 1 application topically daily.   LORazepam 1 MG tablet Commonly known as:  ATIVAN Take 1 tablet (1 mg total) by mouth 2 (two) times daily as needed for anxiety.   mirtazapine 15 MG tablet Commonly known as:  REMERON Take 15 mg by mouth at bedtime.   nitroGLYCERIN 0.4 MG SL tablet Commonly known as:  NITROSTAT Place 1 tablet (0.4 mg total) under the tongue every 5 (five) minutes as needed for chest pain.   nystatin powder Commonly known as:  MYCOSTATIN/NYSTOP Apply topically 3 (three) times daily.   ondansetron 4 MG disintegrating  tablet Commonly known as:  ZOFRAN ODT Take 1 tablet (4 mg total) by mouth every 8 (eight) hours as needed for nausea or vomiting.   oxyCODONE-acetaminophen 5-325 MG tablet Commonly known as:  PERCOCET/ROXICET Take 1 tablet by mouth every 6 (six) hours as needed for moderate pain or severe pain.   PROAIR HFA 108 (90 Base) MCG/ACT inhaler Generic drug:  albuterol Inhale 1 puff  into the lungs every 6 (six) hours as needed for wheezing or shortness of breath.   promethazine 25 MG tablet Commonly known as:  PHENERGAN Take 25 mg by mouth every 6 (six) hours as needed for nausea. for nausea   ranolazine 1000 MG SR tablet Commonly known as:  RANEXA Take 1 tablet (1,000 mg total) by mouth 2 (two) times daily.   ticagrelor 90 MG Tabs tablet Commonly known as:  BRILINTA Take 90 mg by mouth 2 (two) times daily.          Total Time in preparing paper work, data evaluation and todays exam - 21 minutes  Dustin Flock M.D on 07/28/2018 at [place]  [phone]

## 2018-07-28 NOTE — Progress Notes (Signed)
Inpatient Diabetes Program Recommendations  AACE/ADA: New Consensus Statement on Inpatient Glycemic Control   Target Ranges:  Prepandial:   less than 140 mg/dL      Peak postprandial:   less than 180 mg/dL (1-2 hours)      Critically ill patients:  140 - 180 mg/dL   Results for Renee Mack, Renee Mack (MRN [id number]) as of 07/28/2018 09:45  Ref. Range 07/27/2018 16:53 07/27/2018 17:57 07/27/2018 21:20 07/28/2018 08:15  Glucose-Capillary Latest Ref Range: 70 - 99 mg/dL 270 (H) 254 (H)  Novolog 15 units 331 (H)  Novolog 4 units  Lantus 40 units 336 (H)  Novolog 17 units   Review of Glycemic Control  Diabetes history: DM2 Outpatient Diabetes medications: Lantus 60 units BID, Novolog 35 units TID with meals Current orders for Inpatient glycemic control: Lantus 40 units BID, Novolog 10 units TID with meals, Novolog 0-9 units TID with meals, Novolog 0-5 units QHS  Inpatient Diabetes Program Recommendations:   Insulin-Correction: Please consider increasing Novolog correction to moderate scale (0-15 units) TID with meals.  Insulin-Meal Coverage: If post prandial glucose is consistently greater than 180 mg/dl, please consider increasing meal coverage to Novolog 15 units TID with meals.  HgbA1C: A1C 12.6% on 07/27/18. Per Care Everywhere, A1C was 12.7% on 07/18/18. Patient was seen by Inpatient Diabetes team on 05/23/18 during prior hospitalization. Patient is followed by Renae Gloss, NP (works with Dr. Gabriel Carina) and was last seen 07/26/18.   NOTE: Per office note by Renae Gloss, NP on 07/26/18, patient was asked to:  Increase Lantus from 60 units twice daily up to 70 units twice daily (ok to start at 65 units and then increase in a week if needed) Increase Novolog 35 units with meals plus sliding scale: BS 200-250 5 units BS 251-300 - 10 units BS 301-350 - 15 units BS 351- 400 - 20 units BS 401-450 - 25 units  Thanks, Barnie Alderman, RN, MSN, CDE Diabetes Coordinator Inpatient Diabetes Program [phone]  (Team Pager from 8am to 5pm)

## 2018-07-28 NOTE — Progress Notes (Signed)
Patient discharged to home.  Tele and IV d/c'd prior to discharge. Patient verbalizes understanding of discharge instructions. 

## 2018-07-28 NOTE — Progress Notes (Signed)
Progress Note  Patient Name: Renee Mack Date of Encounter: 07/28/2018  Primary Cardiologist: Fletcher Anon  Subjective   Continues to have left sided thoracic back, shoulder,and chest pain that is characterized as sharp and short-lived at times. Palpation reproduces her pain. Reports compliance with all medications and denies tobacco use. Ruled out.   Inpatient Medications    Scheduled Meds: . aspirin EC  81 mg Oral Daily  . atorvastatin  80 mg Oral q1800  . citalopram  20 mg Oral Daily  . heparin injection (subcutaneous)  5,000 Units Subcutaneous Q8H  . insulin aspart  0-5 Units Subcutaneous QHS  . insulin aspart  0-9 Units Subcutaneous TID WC  . insulin aspart  10 Units Subcutaneous TID AC  . insulin glargine  40 Units Subcutaneous BID  . isosorbide mononitrate  30 mg Oral QPM  . mirtazapine  15 mg Oral QHS  . pantoprazole  40 mg Oral Daily  . ranolazine  1,000 mg Oral BID  . ticagrelor  90 mg Oral BID   Continuous Infusions:  PRN Meds: acetaminophen, albuterol, ALPRAZolam, cyclobenzaprine, LORazepam, morphine injection, nitroGLYCERIN, ondansetron, oxyCODONE-acetaminophen, promethazine   Vital Signs    Vitals:   07/28/18 0354 07/28/18 0356 07/28/18 0456 07/28/18 0635  BP: 90/60 (!) 82/59 102/60 99/62  Pulse: 61 62 66 65  Resp: 18     Temp: 98.1 F (36.7 C)     TempSrc: Oral     SpO2: 96%     Weight:  61.2 kg    Height:        Intake/Output Summary (Last 24 hours) at 07/28/2018 0752 Last data filed at 07/28/2018 0235 Gross per 24 hour  Intake 1771 ml  Output 1400 ml  Net 371 ml   Filed Weights   07/27/18 0525 07/28/18 0356  Weight: 61 kg 61.2 kg    Telemetry    NSR - Personally Reviewed  ECG    n/a - Personally Reviewed  Physical Exam   GEN: No acute distress.   Neck: No JVD. Cardiac: RRR, no murmurs, rubs, or gallops. Palpation of the left side of her thoracic back reproduces her symptoms.  Respiratory: Clear to auscultation bilaterally.  GI:  Soft, nontender, non-distended.   MS: No edema; No deformity. Neuro:  Alert and oriented x 3; Nonfocal.  Psych: Normal affect.  Labs    Chemistry Recent Labs  Lab 07/27/18 0527  NA 130*  K 5.0  CL 96*  CO2 25  GLUCOSE 478*  BUN 22*  CREATININE 0.77  CALCIUM 9.6  GFRNONAA >60  GFRAA >60  ANIONGAP 9     Hematology Recent Labs  Lab 07/27/18 0527  WBC 9.8  RBC 4.95  HGB 15.6*  HCT 46.2*  MCV 93.3  MCH 31.5  MCHC 33.8  RDW 13.2  PLT 284    Cardiac Enzymes Recent Labs  Lab 07/27/18 0830 07/27/18 1507 07/27/18 1859 07/28/18 0023  TROPONINI <0.03 <0.03 <0.03 <0.03   No results for input(s): TROPIPOC in the last 168 hours.   BNPNo results for input(s): BNP, PROBNP in the last 168 hours.   DDimer No results for input(s): DDIMER in the last 168 hours.   Radiology    Dg Chest Port 1 View  Result Date: 07/27/2018 IMPRESSION: No active disease. Electronically Signed   By: Monte Fantasia M.D.   On: 07/27/2018 05:54    Cardiac Studies   LHC 06/15/2018: Conclusion     Ost 2nd Diag to 2nd Diag lesion is 90% stenosed.  Non-stenotic Prox LAD lesion was previously treated.  Non-stenotic Mid LAD to Dist LAD lesion was previously treated.  Non-stenotic Mid LAD lesion.  Balloon angioplasty was performed.  Prox Cx lesion is 30% stenosed.  Prox RCA lesion is 90% stenosed.  There is mild left ventricular systolic dysfunction.  LV end diastolic pressure is normal.  The left ventricular ejection fraction is 45-50% by visual estimate.  Ost 2nd Mrg to 2nd Mrg lesion is 100% stenosed.   1.  Widely patent LAD stent with no significant restenosis.  Stable 90% stenosis in the nondominant small right coronary artery not different from before.  Also there is a small OM2 which is subtotally occluded proximally.  This is also unchanged from recent cardiac catheterization and catheterization in August.  2.  Mildly reduced LV systolic function with an EF of 45 to 50%  with anterior wall hypokinesis.  Normal left ventricular end-diastolic pressure.  Recommendations: Continue medical therapy. No clear culprit for the patient's symptoms other than known small vessel disease.    The patient can be discharged home from a cardiac standpoint.     Patient Profile     53 y.o. female with history of CAD s/pMI and drug-eluting stent placement to LAD x3 in7/2019,with subsequent readmission for subacute stent thrombosis and repeat PCI x3s/p PTCA most recently in 41/2878, combined systolic and diastolic congestive heart failure, ischemic cardiomyopathy with EF of 45 to 50%, former tobacco abuse, COPD, hyperlipidemia, GERD,poorly controlledtype 2 diabetes mellitus, and pancreatitiswho we were asked to see for chest pain.   Assessment & Plan    1. Atypical chest pain: -Ruled out -Symptoms more concerning for MSK etiology -Has follow up with pain clinic and neurosurgery  -No plans for inpatient ischemic evaluation -Unable to escalate antianginal therapy secondary to BP and HR  2. CAD: -Continue DAPT with ASA and Brilinta -Continue Imdur and Ranexa -Not on a beta blocker secondary to soft BP and HR -Aggressive risk factor modification   3. HFrEF: -She does not appear grossly volume up -No longer on beta blocker secondary to relative hypotension -Relative hypotension has precluded the addition of ACE inhibitor/ARB/spironolactone  4. HLD: -Remains on Lipitor 80 mg daily -LDL of 118 this admission with a goal of < 70 -Add Zetia 10 mg daily -Recehck fasting lipid panel and liver function in ~ 8 weeks -If LDL remains above goal at that time, consider addition of PCSK-9 inhibitor    For questions or updates, please contact [facility] Please consult [email or web address] for contact info under Cardiology/STEMI.    Signed, Christell Faith, PA-C [place] Pager: [phone] 07/28/2018, 7:52 AM

## 2018-07-28 NOTE — Plan of Care (Signed)
  Problem: Pain Managment: Goal: General experience of comfort will improve Outcome: Not Progressing Note:  Pt is requiring morphine every 4 hour, percocet and flexiril

## 2018-07-31 ENCOUNTER — Encounter: Payer: Self-pay | Admitting: Radiation Oncology

## 2018-07-31 ENCOUNTER — Ambulatory Visit
Admission: RE | Admit: 2018-07-31 | Discharge: 2018-07-31 | Disposition: A | Discharge status: Home / Self Care | Payer: Medicaid Other | Source: Ambulatory Visit | Attending: Radiation Oncology | Admitting: Radiation Oncology

## 2018-07-31 ENCOUNTER — Other Ambulatory Visit: Payer: Self-pay

## 2018-07-31 ENCOUNTER — Other Ambulatory Visit: Payer: Self-pay | Admitting: *Deleted

## 2018-07-31 VITALS — BP 128/75 | HR 80 | Temp 96.1°F | Resp 16 | Wt 141.2 lb

## 2018-07-31 DIAGNOSIS — C519 Malignant neoplasm of vulva, unspecified: Secondary | ICD-10-CM

## 2018-07-31 NOTE — Progress Notes (Signed)
Met with Renee Mack today before, during, and after appointment with Dr. Chrystal. She has completed her antibiotics and she states her pain is worse. She continues to use her Nystatin powder. Candidiasis is still present but does appear less. Bilateral labia with less erythema. Area around clitoris, vulva, urethra with no changes. She is worried about running out of pain medication stating she only has enough to last through Tuesday. Noted refill given in the ED on 2/7, 20 tablets, one every 6 hours. Instructed her to continue the rotation she was previously given with tylenol and if that is not effective she can call the triage nurse. We will see her back on Wednesday in Gyn Onc clinic with Dr. Secord and Dr. Chrystal to finalize her treatment plan.  

## 2018-07-31 NOTE — Progress Notes (Signed)
Radiation Oncology Follow up Note  Name: Renee Mack   Date:   07/31/2018 MRN:  [id number] DOB: 1966/01/10    This 53 y.o. 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  REFERRING PROVIDER: Nobie Mack *  HPI: patient is a 53 year old female.with history of uterine carcinoma status post hysterectomy back in 1994. She was diagnosed with stage I vulvar cancer [place] 2005 treated with left radicalvulvectomy and bilateral groin dissection. She is also had Mohs surgery for right groin squamous cell carcinoma. She's also had multiple workups for recurrent vulvar cancer. She was treated in May 2019 with the vulvar cellulitis treated with broad-spectrum antibiotics. She's also seems on antibiotic therapy to develop significant yeast infections in this region. She was referred to me for possible radiation therapy and had active possible vulvarcellulitis in a poor on a 14 day course of Bactrim DS. This is resolved somewhat although the area is still losing and inflamed. She's here to discuss possible commencing with treatment.  COMPLICATIONS OF TREATMENT: none  FOLLOW UP COMPLIANCE: keeps appointments   PHYSICAL EXAM:  BP 128/75 (BP Location: Left Arm, Patient Position: Sitting)   Pulse 80   Temp (!) 96.1 F (35.6 C) (Tympanic)   Resp 16   Wt 141 lb 3.3 oz (64 kg)   BMI 26.68 kg/m  Area the vulvar shows improvement although still marked erythematous changes of the skin in this area which could be consistent withcellulitis an active infection.Well-developed well-nourished patient in NAD. HEENT reveals PERLA, EOMI, discs not visualized.  Oral cavity is clear. No oral mucosal lesions are identified. Neck is clear without evidence of cervical or supraclavicular adenopathy. Lungs are clear to A&P. Cardiac examination is  essentially unremarkable with regular rate and rhythm without murmur rub or thrill. Abdomen is benign with no organomegaly or masses noted. Motor sensory and DTR levels are equal and symmetric in the upper and lower extremities. Cranial nerves II through XII are grossly intact. Proprioception is intact. No peripheral adenopathy or edema is identified. No motor or sensory levels are noted. Crude visual fields are within normal range.  RADIOLOGY RESULTS: no current films for review  PLAN: patient is scheduled to see Dr. Theora Mack this Wednesday and I will review the case at that time with her. Certainly radiation therapy in a situation with active cellulitis is not indicated and will cause significant side effects. Possible in-house antibiotic therapy may improve the situation although I like to examine and discussed the case personally with Dr. Theora Mack at this time. Arrangements for follow-up on Wednesday with Dr. Theora Mack were range with nurse navigator.  I would like to take this opportunity to thank you for allowing me to participate in the care of your patient.Renee Filbert, MD

## 2018-08-01 NOTE — Progress Notes (Signed)
Gynecologic Oncology Interval Visit   Referring Provider: Dr. Parks Ranger  Chief Complaint: VIN 3  Subjective:  Renee Mack is a 53 y.o. female s/p TH for "uterine cancer" and h/o vulvar cancer, initially seen in consultation for Dr. Parks Ranger for possible recurrent vulvar cancer, who returns to clinic today for management of VIN 3.   Last seen in clinic by Dr. Theora Gianotti on 07/05/2018. Perianal biopsy performed.   A. Perianal Lesion- left; biopsy:  - high grade squamous intraepithelial lesion (HSIL- classifiable as PAIN-3, warty type).  - candidiasis  Given that cardiology advised against stopping anti-platelet therapies, we discussed radiation vs imiquimod but advised that she was a poor candidate for topical therapies. She was referred to radiation and saw Dr. Baruch Gouty on 07/12/2018. Exam concerning for secondary infection and she was started on bactrim ds BID x 14 days and given topical econazole.   Both vulvar and perianal biopsies were sent to [facility] for pathology review.   A.  Perianal lesion, left, biopsy.  Outside consult, [id number], [facility], [place], [place]. Date of procedure 07-05-18: - High grade squamous intraepithelial lesion (HSIL)/anal intraepithelial neoplasia-3 (AIN/PAIN-3). By report, fungal forms consistent with Candida sp. were visualized on PASF staining.  She saw Dr. Baruch Gouty for follow-up on 07/31/2018. Exam again concerning for cellulitis but overall improved.   Today, she reports continued pelvic pain and vulvar irritation and itching.  With vaginal bleeding.  She reports pain 8/10.  Has been taking Percocet for pain every 6 hours which was prescribed in hospital during admission for chest pain. She has completed bactrim. Continues topical ketoconazole. She feels the antibiotic may have made the fungal infection worse.     Gynecologic Oncology History: Renee Mack is a pleasant female s/p TH for "uterine cancer" and h/o vulvar cancer,  initially seen in consultation for Dr. Parks Ranger for possible recurrent vulvar cancer, who presented for management of VIN 3. Her history is as follows.  ~ 1994  History of 'uterine cancer' s/p hysterectomy in [place] for patient reported 'dysplasia; they'd tried burning and cutting it out'- vaginal hysterectomy; Retained ovaries and 1 tube.  05/05/2004- stage I vulvar cancer 1.3 mm invasion treated with left radical vulvectomy and bilateral groin node dissection 2013-   pap benign HPV negative 04/2012- right vulvectomy for microinvasive vulvar cancer and been 3 by Dr. Maudie Mercury 10/04/2012  MOHS surgery right groin for [facility] 10/15/2014- vulvar colposcopy revealed no suspicious lesions; patient was well-appearing and transferred to ED where she was diagnosed with DKA and pancreatitis 04/07/2017- presented to ED with vulvar pain exam notable for cobblestone epithelium at left labia minora (biopsied), cobblestoned, irritated appearing mucosa to left of urethra and vagina.  Approximately 2 mm isolated vesicle on right labia.    Macerated erythematous skin at buttocks abutting anus appearing chronically moist.  White epithelium at 6:00 of the anus but perianal exam limited by lack of stirrups.  Vulvar biopsy showed HSIL at least VIN 2. 04/12/17-  seen for ER follow-up by Dr. [name]. Per note, at least recurrent disease amenable to resection.  Left upper aspect of vulva not amenable to outpatient laser.  May also have right-sided disease but patient declined vulvar biopsy.    Chronic vulvar pain not reproducible. 11/13/2017-11/16/2017-admitted to hospital for vulvar cellulitis and underwent I&D, treated with broad-spectrum antibiotics and discharged on Bactrim. 11/24/2017-  ER visit for severe vulvovaginal candidiasis treated with oral fluconazole and topical miconazole with oxycodone for breakthrough pain.   Patient lost to follow-up.   She  has history of type 2 diabetes mellitus with hemoglobin A1c of 11.9% on  03/24/18 down from 15.1% on 11/14/2017. Current smoker. CAD s/p NSTEMI 12/2017 stenting x 5, chronic combined systolic and diastolic heart failure, ischemic cardiomyopathy, GERD, pancreatitis, multiple episodes of DKA with recurrent UTI, depression, panic disorder, chronic pain syndrome. LVEF 45-50%.  She says that recent heart attack "scared her" and that she realized that she needs to start taking better care of herself.  She has been advised that she needs additional surgery for treatment of vulvar cancer but cannot have surgery until her blood sugars are under better control. She continues indefinite anticoagulation with aspirin and brilinta (antiplatelet). She has vulvar pain , worse with urination, urinary incontinence. She also complains of night sweats, headaches.  She was discharged from hospital yesterday, 05/23/18 after admission for hyperglycemia and unstable angina with underlying CAD. She underwent cardiac catheterization and percutaneous angioplasty of prior stent.  Per her report she had a Pap 6 months ago but in the computer her last Pap was 03/2012 ([facility]). She complains of vulvar pain.    Vaginal Pap on 05/24/18 - HSIL-epithelial cell abnormality/HSILVB.  Vulvar lesion on exam concerning for VIN versus vulvar cancer recurrence.  She returns today for biopsy.  She is currently on anticoagulation per tecagrelor and aspirin post NSTEMI and cardiac cath on 05/22/18.  Cardiology was consulted for recommendation and they advised not to hold antiplatelet medications due to her very high cardiac risk.  She was hospitalized on 05/28/2018-05/29/2018 for hyperglycemia and intractable nausea and vomiting.  She was discharged home.   On 06/07/18 she underwent a vulvar biopsy with Dr. Fransisca Connors.  DIAGNOSIS:  A. VULVA; BIOPSY:  - AT LEAST HIGH-GRADE SQUAMOUS INTRAEPITHELIAL LESION (VIN 3; SEVERE DYSPLASIA).  - DUE TO FRAGMENTATION AND TANGENTIAL TISSUE ORIENTATION, DEFINITIVE INVASION CANNOT BE RELIABLY  ASSESSED.   Vaginal Pap on 05/24/18 - HSIL-epithelial cell abnormality/HSIL; negative for HRHPV tested.   Previous discussions with cardiology advised against stopping anti-platelet therapies for procedures or treatments. Given this, management options were discussed including radiation vs imiquimod. She expressed considerable pain involving the vulvar and perianal area. She is using Tylenol as well as lidocaine 2% jelly but has minimal relief.   She tried imiquimod but was unable to tolerate the therapy due to vulvar pain/irritation.     Problem List: Patient Active Problem List   Diagnosis Date Noted  . Hyperglycemia 05/28/2018  . Vulvar intraepithelial neoplasia (VIN) grade 3 05/24/2018  . History of ST elevation myocardial infarction (STEMI) 02/27/2018  . CAD (coronary artery disease) 02/27/2018  . Hyperlipidemia associated with type 2 diabetes mellitus ([place]) 02/27/2018  . Anxiety associated with depression 02/27/2018  . Chronic systolic heart failure ([place])   . Chest pain 12/26/2017  . Unstable angina ([place])   . Ischemic cardiomyopathy   . Former smoker   . Protein-calorie malnutrition, severe 07/18/2017  . Uncontrolled type 2 diabetes with neuropathy ([place]) 12/28/2014  . COPD (chronic obstructive pulmonary disease) ([place]) 12/28/2014  . GERD (gastroesophageal reflux disease) 12/28/2014  . History of shingles 12/28/2014  . Hyperlipidemia LDL goal <70 12/28/2014    Past Medical History: Past Medical History:  Diagnosis Date  . Cancer ([place])    vulvular  . Cervical disc disease    a. 01/2018 MRI Cervical spine: Cervical spondylosis with multilevel disc and facet degeneration greatest at C5-6 and C6-7.  Moderate to sev R C5-6, mod L C5-6, and mod bilat C6-7 foraminal stenosis with multilevel mild foraminal stenosis.  Mild  C5-6 and mod C6-7 canal stenosis. C6-7 central cord impingement w/ cord flattening.  . Chronic combined systolic (congestive) and diastolic (congestive) heart  failure ([place])    a. 12/2017 Echo: EF 30-35%, mid-apicalanteroseptal, ant, and apical AK. Gr1 DD. Mild conc LVH; b. 03/2018 Echo: EF 45-50%, antsept, ant HK. Mild MR. Nl LA size. Nl RV fxn.  Marland Kitchen COPD (chronic obstructive pulmonary disease) ([facility])    not on home oxygen  . Coronary artery disease    a. 12/2017 ACS/PCI: LAD 100p (2.25x26 Resolute Onyx DES), 40m (2.0x12 Resolute Onyx DES), 80d (2.0x15 Resolute Onyx DES), RCA 90p (non-dominant). EF 25-35%. Post-MI course complicated by CGS; b. 07/4233 NSTEMI/subacute thrombosis-->LAD 100 (PTCA + DES x 1); c. 01/2018 NSTEMI/PCI: LM min irregs, LAD 50-60p ISR/hazy (3.5x12 Resolute DES), 20m/d (underexpansion of prior stents-->PTCA). EF 45-50%.  . Diabetes mellitus without complication ([place])   . GERD (gastroesophageal reflux disease)   . H/O degenerative disc disease   . Hypotension   . Ischemic cardiomyopathy    a. 12/2017 Echo: EF 30-35%; b. 03/2018 Echo: EF 45-50%.  . Myocardial infarction ([place])    a. 12/2017-->DES to LAD x 3.  . Pancreatitis   . Shingles   . Spinal stenosis   . Tobacco abuse   . Ulcer (traumatic) of oral mucosa   . Urinary incontinence   . VIN III (vulvar intraepithelial neoplasia III)     Past Surgical History: Past Surgical History:  Procedure Laterality Date  . ABDOMINAL HYSTERECTOMY     partial  . CORONARY BALLOON ANGIOPLASTY N/A 05/22/2018   Procedure: CORONARY BALLOON ANGIOPLASTY;  Surgeon: Wellington [place], MD;  Location: [facility];  Service: Cardiovascular;  Laterality: N/A;  . CORONARY STENT INTERVENTION N/A 12/26/2017   Procedure: CORONARY STENT INTERVENTION;  Surgeon: Wellington [place], MD;  Location: [facility];  Service: Cardiovascular;  Laterality: N/A;  . CORONARY STENT INTERVENTION N/A 02/08/2018   Procedure: CORONARY STENT INTERVENTION;  Surgeon: Nelva Bush, MD;  Location: [facility];  Service: Cardiovascular;  Laterality: N/A;  . CORONARY/GRAFT ACUTE MI REVASCULARIZATION N/A  12/19/2017   Procedure: Coronary/Graft Acute MI Revascularization;  Surgeon: Wellington [place], MD;  Location: [facility];  Service: Cardiovascular;  Laterality: N/A;  . ECTOPIC PREGNANCY SURGERY Left   . INTRAVASCULAR ULTRASOUND/IVUS N/A 02/08/2018   Procedure: Intravascular Ultrasound/IVUS;  Surgeon: Nelva Bush, MD;  Location: [facility];  Service: Cardiovascular;  Laterality: N/A;  . LEFT HEART CATH AND CORONARY ANGIOGRAPHY N/A 12/19/2017   Procedure: LEFT HEART CATH AND CORONARY ANGIOGRAPHY;  Surgeon: Wellington [place], MD;  Location: [facility];  Service: Cardiovascular;  Laterality: N/A;  . LEFT HEART CATH AND CORONARY ANGIOGRAPHY N/A 12/26/2017   Procedure: LEFT HEART CATH AND CORONARY ANGIOGRAPHY;  Surgeon: Wellington [place], MD;  Location: [facility];  Service: Cardiovascular;  Laterality: N/A;  . LEFT HEART CATH AND CORONARY ANGIOGRAPHY N/A 02/08/2018   Procedure: LEFT HEART CATH AND CORONARY ANGIOGRAPHY;  Surgeon: Nelva Bush, MD;  Location: [facility];  Service: Cardiovascular;  Laterality: N/A;  . LEFT HEART CATH AND CORONARY ANGIOGRAPHY N/A 05/22/2018   Procedure: LEFT HEART CATH AND CORONARY ANGIOGRAPHY;  Surgeon: Wellington [place], MD;  Location: [facility];  Service: Cardiovascular;  Laterality: N/A;  . LEFT HEART CATH AND CORONARY ANGIOGRAPHY N/A 06/15/2018   Procedure: LEFT HEART CATH AND CORONARY ANGIOGRAPHY;  Surgeon: Wellington [place], MD;  Location: [facility];  Service: Cardiovascular;  Laterality: N/A;  . LYMPHADENECTOMY    . SPLENECTOMY, PARTIAL    . vulvulasectomy      Past Gynecologic History:  Menarche: age 62 Menstrual details- irregular menstrual periods Hysterectomy ~ 25 years ago History of abnormal pap: yes History of STDs: denies Sexually active: denies  OB History:  [id number] Vaginal delivery  OB History  No obstetric history on file.    Family History: Family History   Problem Relation Age of Onset  . Non-Hodgkin's lymphoma Mother   . Osteoporosis Mother   . Lupus Sister   . Heart disease Brother   . Heart attack Brother   . Heart attack Father     Social History: Social History   Socioeconomic History  . Marital status: Widowed    Spouse name: Not on file  . Number of children: 4  . Years of education: Not on file  . Highest education level: Not on file  Occupational History  . Occupation: disabled  Social Needs  . Financial resource strain: Not very hard  . Food insecurity:    Worry: Never true    Inability: Never true  . Transportation needs:    Medical: No    Non-medical: No  Tobacco Use  . Smoking status: Former Smoker    Packs/day: 0.50    Years: 30.00    Pack years: 15.00    Types: Cigarettes    Last attempt to quit: 12/19/2017    Years since quitting: 0.6  . Smokeless tobacco: Never Used  . Tobacco comment: quit 12/19/2017.  Substance and Sexual Activity  . Alcohol use: No  . Drug use: No  . Sexual activity: Not Currently  Lifestyle  . Physical activity:    Days per week: 7 days    Minutes per session: 30 min  . Stress: Very much  Relationships  . Social connections:    Talks on phone: More than three times a week    Gets together: Patient refused    Attends religious service: Never    Active member of club or organization: No    Attends meetings of clubs or organizations: Never    Relationship status: Widowed  . Intimate partner violence:    Fear of current or ex partner: No    Emotionally abused: No    Physically abused: No    Forced sexual activity: No  Other Topics Concern  . Not on file  Social History Narrative   Lives in [place] by herself.  Does not routinely exercise.    Allergies: Allergies  Allergen Reactions  . Bee Venom Itching, Shortness Of Breath and Swelling  . Metformin And Related Shortness Of Breath and Swelling  . Darvon [Propoxyphene] Itching  . Gabapentin Swelling  . Nsaids  Other (See Comments)    Ulcers   . Tramadol Hives  . Contrast Media [Iodinated Diagnostic Agents] Rash    If you benadryl, and steroids she is able to take the contrast per pt    Current Medications: Current Outpatient Medications  Medication Sig Dispense Refill  . acetaminophen (TYLENOL) 325 MG tablet Take 650 mg by mouth every 6 (six) hours as needed for mild pain.     Marland Kitchen albuterol (PROAIR HFA) 108 (90 Base) MCG/ACT inhaler Inhale 1 puff into the lungs every 6 (six) hours as needed for wheezing or shortness of breath.     Marland Kitchen aspirin 81 MG tablet Take 81 mg by mouth daily.    Marland Kitchen atorvastatin (LIPITOR) 80 MG tablet  Take 1 tablet (80 mg total) by mouth daily at 6 PM. 90 tablet 3  . citalopram (CELEXA) 20 MG tablet Take 1 tablet (20 mg total) by mouth daily. 90 tablet 1  . cyclobenzaprine (FLEXERIL) 5 MG tablet Take 1 tablet (5 mg total) by mouth 3 (three) times daily as needed for muscle spasms. 10 tablet 0  . esomeprazole (NEXIUM) 40 MG capsule Take 40 mg by mouth daily.     . insulin aspart (NOVOLOG) 100 UNIT/ML injection Inject 25 Units into the skin 3 (three) times daily before meals. Take 10 units PLUS sliding scale (Patient taking differently: Inject 35 Units into the skin 3 (three) times daily before meals. Take 10 units PLUS sliding scale ) 22.5 mL 1  . insulin glargine (LANTUS) 100 UNIT/ML injection Inject 0.55 mLs (55 Units total) into the skin 2 (two) times daily. (Patient taking differently: Inject 60 Units into the skin 2 (two) times daily. ) 33 mL 1  . isosorbide mononitrate (IMDUR) 30 MG 24 hr tablet Take 1 tablet (30 mg total) by mouth daily. 90 tablet 3  . ketoconazole (NIZORAL) 2 % cream Apply 1 application topically daily. 60 g 1  . LORazepam (ATIVAN) 1 MG tablet Take 1 tablet (1 mg total) by mouth 2 (two) times daily as needed for anxiety. 60 tablet 2  . mirtazapine (REMERON) 15 MG tablet Take 15 mg by mouth at bedtime.    Marland Kitchen nystatin (MYCOSTATIN/NYSTOP) powder Apply topically  3 (three) times daily. 15 g 0  . ondansetron (ZOFRAN ODT) 4 MG disintegrating tablet Take 1 tablet (4 mg total) by mouth every 8 (eight) hours as needed for nausea or vomiting. 20 tablet 0  . oxyCODONE-acetaminophen (PERCOCET/ROXICET) 5-325 MG tablet Take 1 tablet by mouth every 6 (six) hours as needed for moderate pain or severe pain. 20 tablet 0  . ranolazine (RANEXA) 1000 MG SR tablet Take 1 tablet (1,000 mg total) by mouth 2 (two) times daily. 120 tablet 0  . ticagrelor (BRILINTA) 90 MG TABS tablet Take 90 mg by mouth 2 (two) times daily.    . nitroGLYCERIN (NITROSTAT) 0.4 MG SL tablet Place 1 tablet (0.4 mg total) under the tongue every 5 (five) minutes as needed for chest pain. (Patient not taking: Reported on 08/02/2018) 10 tablet 0  . promethazine (PHENERGAN) 25 MG tablet Take 25 mg by mouth every 6 (six) hours as needed for nausea. for nausea  3   No current facility-administered medications for this visit.     Review of Systems General: Fatigue and weakness Skin: no complaints Eyes: no complaints HEENT: no complaints Breasts: no complaints Pulmonary: no complaints Cardiac: no complaints Gastrointestinal: Abdominal pain, bloating, decreased appetite, nausea vomiting, constipation Genitourinary/Sexual: no complaints Ob/Gyn: Per HPI Musculoskeletal: Back pain-chronic Hematology: On aspirin and Brilinta Neurologic/Psych: no complaints   Objective:  Physical Examination:  BP (!) 145/85 (BP Location: Left Arm, Patient Position: Sitting)   Pulse 84 Comment: manually  Temp (!) 95.2 F (35.1 C) (Tympanic)   Resp 18   Wt 136 lb 8 oz (61.9 kg)   BMI 25.79 kg/m     ECOG Performance Status: 1 - Symptomatic but completely ambulatory  GENERAL: chronically ill appearing female in no acute distress HEENT:  PERRL, neck supple with midline trachea.  ABDOMEN:  Soft, nontender, nondistended.  EXTREMITIES:  No peripheral edema.   NEURO:  Nonfocal. Well oriented.  Appropriate  affect.  Pelvic: EGBUS: very abnormal appearing vulvar with erythema with sharply defined velvety changes involving  the bilateral labia and clitoral hood. Prior biopsy site completely healed Area around the urethra on the right is a bit irregular and finding concerning for possible recurrent disease. Diffuse erythema involving the posterior vulva consistent with candidiasis which is unchanged compared to prior exams.   The remainder of the exam was deferred. Exam findings from 07/05/2018 Vagina: small 5-8 mm nodules noted otherwise negative. No bleeding.  Uterus: surgically absent Cervix: surgically absent    Lab Review No labs on site today.  - HIV - nonreactive 06/13/2018  Radiologic Imaging: No imaging on site today    Assessment:  Renee Mack is a 53 y.o. female diagnosed with history of stage I vulvar cancer 1.3 mm invasion treated with left radical vulvectomy and bilateral groin node dissection in 05/05/2004; followed by right vulvectomy for microinvasive vulvar cancer 04/2012; MOHS surgery right groin for [facility] 10/04/2012; subsequent VIN2 at least disease on biopsy 03/2017; then lost to follow up. Exam concerning for VIN versus vulvar cancer recurrence. Vulvar Biopsy at least VIN3 and definitive invasion can note be assessed.   Poor candidate for topical imiquimod or 5FU therapy.   Perianal lesion concern for malignancy, biopsy c/w high grade squamous intraepithelial lesion   HGSIL Vaginal Pap, negative for HRHPV types tested.   Symptomatic vulvar candidiasis-unable to use Diflucan due to contraindication with her anticoagulation therapy. Symptoms suboptimally controlled with Nystatin. HIV negative.   Medical co-morbidities complicating care:History of ST elevation myocardial infarction (STEMI); CAD; type 2 DM; Chronic systolic heart failure ([place]); Ischemic cardiomyopathy; Protein-calorie malnutrition, severe; COPD. Currently on anticoagulation with ticagrelor CAD s/p NSTEMI 12/2017  stenting x 5; s/p cardiac catherization 05/22/2018 and percutaneous angioplasty of prior stent.  Anticoagulation was discussed with cardiology prior to biopsy and they advised that patient was too high risk to hold medication.    Plan:   Problem List Items Addressed This Visit      Genitourinary   Vulvar intraepithelial neoplasia (VIN) grade 3 - Primary     VIN3, unable to assess for definitive invasion. Given degree of vulvar discomfort she is a poor candidate for topical imiquimod or 5FU therapy. Recommended Radiation Oncology, however, Dr. Baruch Gouty very concerned about radiation given her vulvar findings and concern for cellulitis. I do not think she has cellulitis but rather two different vulvar issues - the bright red area by the introitus and urethra may represent dysplasia or coloration change due to prior therapy. The posterior and perianal area most likely due to candidiasis.   I spoke with Dr. Baruch Gouty and he recommended second opinion at [facility] with the Radiation Oncology team there. If radiation is not an option we will need to discuss with cardiologist regarding when anti-platelet therapy can be stopped and surgery considered.   [facility] review of vulvar biopsy still pending.    Vulvar candidiasis, topical antifungals recommended, however, they have not been sufficient to control disease. We will speak with pharmacy and her cardiologist to see if there is another option other than ranolazine so that she can restart the diflucan.   HSIL vaginal Pap with negative HRHPV testing. Follow up Pap in one year, due in 12/20.    I personally had a face to face interaction and evaluated the patient jointly with the NP, Ms. Beckey Rutter.  I have reviewed her history and available records and have performed the key portions of the physical exam including general, HEENT, abdominal exam, pelvic exam with my findings confirming those documented above by the APP.  I have  discussed the case with  the APP and the patient.  I agree with the above documentation, assessment and plan which was fully formulated by me.  Counseling was completed by me.   I personally saw the patient and performed a substantive portion of this encounter in conjunction with the listed APP as documented above.  A total of 25 minutes were spent with the patient/family today; >50% was spent in education, counseling and coordination of care for ovarian cancer.   Renee Gaetana Michaelis, MD  CC:  Olin Hauser, DO [address] [place], [place] [address] [phone]

## 2018-08-02 ENCOUNTER — Telehealth: Payer: Self-pay

## 2018-08-02 ENCOUNTER — Other Ambulatory Visit: Payer: Self-pay

## 2018-08-02 ENCOUNTER — Inpatient Hospital Stay: Payer: Medicaid Other | Attending: Obstetrics and Gynecology | Admitting: Obstetrics and Gynecology

## 2018-08-02 VITALS — BP 145/85 | HR 84 | Temp 95.2°F | Resp 18 | Wt 136.5 lb

## 2018-08-02 DIAGNOSIS — Z87891 Personal history of nicotine dependence: Secondary | ICD-10-CM | POA: Insufficient documentation

## 2018-08-02 DIAGNOSIS — D071 Carcinoma in situ of vulva: Secondary | ICD-10-CM | POA: Insufficient documentation

## 2018-08-02 DIAGNOSIS — G893 Neoplasm related pain (acute) (chronic): Secondary | ICD-10-CM | POA: Diagnosis not present

## 2018-08-02 MED ORDER — OXYCODONE HCL 5 MG PO TABS: 5.0000 mg | ORAL_TABLET | Freq: Four times a day (QID) | ORAL | 0 refills | Status: DC | PRN

## 2018-08-02 NOTE — Progress Notes (Signed)
Here for follow up.  Continues w pain in vaginal area -level 8 today. Pain meds " help a little "

## 2018-08-02 NOTE — Telephone Encounter (Signed)
Called and notified Ms. Hefty that her pain medication prescription is available for pick up at the cancer center front desk. It was brought to my attention that the pain management clinic at [facility] only provides IR procedures and does not manage oral therapy. I attempted to provide her information regarding [facility]. She stated she was eating and would like the information later. She can call them at [phone] to arrange an appointment.

## 2018-08-03 ENCOUNTER — Telehealth: Payer: Self-pay

## 2018-08-03 NOTE — Telephone Encounter (Signed)
Original request for [facility] pathology review sent to pathology to inquire if vulvar biopsy from 06-08-19 - [id number] was sent as it did not result. Anal biopsy from 07-05-18 [id number] did result and Dr. Theora Gianotti has reviewed.

## 2018-08-03 NOTE — Telephone Encounter (Signed)
Referral has been sent to Dr. Christel Mormon at [facility]. Provided phone number to [facility] center for Renee Mack to call and make appointment. [phone]. Informed her that they have different locations and will arrange her with the closest location to her home. She  read back number and verbalized understanding.

## 2018-08-04 ENCOUNTER — Ambulatory Visit: Payer: Medicaid Other | Admitting: Cardiovascular Disease

## 2018-08-04 ENCOUNTER — Telehealth: Payer: Self-pay

## 2018-08-04 NOTE — Telephone Encounter (Signed)
Spoke with [facility] in pathology, 2/13. She stated that the vulvar biopsy was not sent because it was performed by Dr. Fransisca Connors. Reports she will send it out 2/13 at our request.

## 2018-08-07 ENCOUNTER — Telehealth: Payer: Self-pay | Admitting: *Deleted

## 2018-08-07 NOTE — Telephone Encounter (Signed)
Requesting Dr Theora Gianotti to refer her to Pain Clinic . Fax referral to [phone]

## 2018-08-07 NOTE — Telephone Encounter (Signed)
Patient was referred to [facility] in [place]. She will need to complete online new patient paperwork and call [phone] for appointment.

## 2018-08-08 NOTE — Telephone Encounter (Signed)
Patient informed to go online and complete new patient paperwork and then to call [phone] for an appointment and that referral has been sent as requested. She repeated phone number back to me

## 2018-08-09 ENCOUNTER — Telehealth: Payer: Self-pay

## 2018-08-09 NOTE — Telephone Encounter (Signed)
Verified with pathology that vulvar biopsy was sent out on 2/14 to [facility].

## 2018-08-15 ENCOUNTER — Encounter: Payer: Self-pay | Admitting: Emergency Medicine

## 2018-08-15 ENCOUNTER — Other Ambulatory Visit: Payer: Self-pay

## 2018-08-15 DIAGNOSIS — Z7982 Long term (current) use of aspirin: Secondary | ICD-10-CM | POA: Insufficient documentation

## 2018-08-15 DIAGNOSIS — J449 Chronic obstructive pulmonary disease, unspecified: Secondary | ICD-10-CM | POA: Insufficient documentation

## 2018-08-15 DIAGNOSIS — K029 Dental caries, unspecified: Secondary | ICD-10-CM | POA: Insufficient documentation

## 2018-08-15 DIAGNOSIS — Z794 Long term (current) use of insulin: Secondary | ICD-10-CM | POA: Insufficient documentation

## 2018-08-15 DIAGNOSIS — I5042 Chronic combined systolic (congestive) and diastolic (congestive) heart failure: Secondary | ICD-10-CM | POA: Insufficient documentation

## 2018-08-15 DIAGNOSIS — Z87891 Personal history of nicotine dependence: Secondary | ICD-10-CM | POA: Insufficient documentation

## 2018-08-15 DIAGNOSIS — I252 Old myocardial infarction: Secondary | ICD-10-CM | POA: Insufficient documentation

## 2018-08-15 DIAGNOSIS — I2510 Atherosclerotic heart disease of native coronary artery without angina pectoris: Secondary | ICD-10-CM | POA: Diagnosis not present

## 2018-08-15 DIAGNOSIS — I110 Hypertensive heart disease with heart failure: Secondary | ICD-10-CM | POA: Diagnosis not present

## 2018-08-15 DIAGNOSIS — Z79899 Other long term (current) drug therapy: Secondary | ICD-10-CM | POA: Diagnosis not present

## 2018-08-15 DIAGNOSIS — Z8544 Personal history of malignant neoplasm of other female genital organs: Secondary | ICD-10-CM | POA: Diagnosis not present

## 2018-08-15 DIAGNOSIS — K0889 Other specified disorders of teeth and supporting structures: Secondary | ICD-10-CM | POA: Diagnosis present

## 2018-08-15 DIAGNOSIS — E119 Type 2 diabetes mellitus without complications: Secondary | ICD-10-CM | POA: Diagnosis not present

## 2018-08-15 NOTE — ED Triage Notes (Signed)
Patient ambulatory to triage with steady gait, without difficulty or distress noted; pt reports left lower dental pain today

## 2018-08-16 ENCOUNTER — Emergency Department
Admission: EM | Admit: 2018-08-16 | Discharge: 2018-08-16 | Disposition: A | Discharge status: Home / Self Care | Payer: Medicaid Other | Attending: Emergency Medicine | Admitting: Emergency Medicine

## 2018-08-16 DIAGNOSIS — K029 Dental caries, unspecified: Secondary | ICD-10-CM

## 2018-08-16 MED ORDER — OXYCODONE-ACETAMINOPHEN 5-325 MG PO TABS: 1.0000 | ORAL_TABLET | ORAL | 0 refills | Status: DC | PRN

## 2018-08-16 MED ORDER — AMOXICILLIN-POT CLAVULANATE 875-125 MG PO TABS
1.0000 | ORAL_TABLET | Freq: Once | ORAL | Status: AC
  Administered 2018-08-16: 1 via ORAL

## 2018-08-16 MED ORDER — AMOXICILLIN-POT CLAVULANATE 875-125 MG PO TABS: 1.0000 | ORAL_TABLET | Freq: Two times a day (BID) | ORAL | 0 refills | Status: AC

## 2018-08-16 MED ORDER — LIDOCAINE VISCOUS HCL 2 % MT SOLN
15.0000 mL | Freq: Once | OROMUCOSAL | Status: AC
  Administered 2018-08-16: 15 mL via OROMUCOSAL
  Filled 2018-08-16: qty 15

## 2018-08-16 MED ORDER — OXYCODONE-ACETAMINOPHEN 5-325 MG PO TABS
1.0000 | ORAL_TABLET | Freq: Once | ORAL | Status: AC
  Administered 2018-08-16: 1 via ORAL
  Filled 2018-08-16: qty 1

## 2018-08-16 NOTE — ED Notes (Signed)
Pt with broken front bottom teeth x2. C/o pain x2 days. Pt on blood thinner, Brilinta.

## 2018-08-16 NOTE — ED Provider Notes (Signed)
[facility] Emergency Department Provider Note __   First MD Initiated Contact with Patient 08/16/18 [phone]     (approximate)  I have reviewed the triage vital signs and the nursing notes.   HISTORY  Chief Complaint Dental Pain   HPI Renee Mack is a 53 y.o. female presents to the emergency department with 1 day history of mandible central incisor pain.  Patient states current pain score is 10 out of 10.  Patient denies any fever.  Patient denies any difficulty breathing or swallowing. Past Medical History:  Diagnosis Date  . Cancer ([place])    vulvular  . Cervical disc disease    a. 01/2018 MRI Cervical spine: Cervical spondylosis with multilevel disc and facet degeneration greatest at C5-6 and C6-7.  Moderate to sev R C5-6, mod L C5-6, and mod bilat C6-7 foraminal stenosis with multilevel mild foraminal stenosis.  Mild C5-6 and mod C6-7 canal stenosis. C6-7 central cord impingement w/ cord flattening.  . Chronic combined systolic (congestive) and diastolic (congestive) heart failure ([place])    a. 12/2017 Echo: EF 30-35%, mid-apicalanteroseptal, ant, and apical AK. Gr1 DD. Mild conc LVH; b. 03/2018 Echo: EF 45-50%, antsept, ant HK. Mild MR. Nl LA size. Nl RV fxn.  Marland Kitchen COPD (chronic obstructive pulmonary disease) (HCC)    not on home oxygen  . Coronary artery disease    a. 12/2017 ACS/PCI: LAD 100p (2.25x26 Resolute Onyx DES), 33m (2.0x12 Resolute Onyx DES), 80d (2.0x15 Resolute Onyx DES), RCA 90p (non-dominant). EF 25-35%. Post-MI course complicated by CGS; b. 06/9377 NSTEMI/subacute thrombosis-->LAD 100 (PTCA + DES x 1); c. 01/2018 NSTEMI/PCI: LM min irregs, LAD 50-60p ISR/hazy (3.5x12 Resolute DES), 43m/d (underexpansion of prior stents-->PTCA). EF 45-50%.  . Diabetes mellitus without complication ([place])   . GERD (gastroesophageal reflux disease)   . H/O degenerative disc disease   . Hypotension   . Ischemic cardiomyopathy    a. 12/2017 Echo: EF 30-35%; b. 03/2018  Echo: EF 45-50%.  . Myocardial infarction ([place])    a. 12/2017-->DES to LAD x 3.  . Pancreatitis   . Shingles   . Spinal stenosis   . Tobacco abuse   . Ulcer (traumatic) of oral mucosa   . Urinary incontinence   . VIN III (vulvar intraepithelial neoplasia III)     Patient Active Problem List   Diagnosis Date Noted  . Hyperglycemia 05/28/2018  . Vulvar intraepithelial neoplasia (VIN) grade 3 05/24/2018  . History of ST elevation myocardial infarction (STEMI) 02/27/2018  . CAD (coronary artery disease) 02/27/2018  . Hyperlipidemia associated with type 2 diabetes mellitus ([place]) 02/27/2018  . Anxiety associated with depression 02/27/2018  . Chronic systolic heart failure ([place])   . Chest pain 12/26/2017  . Unstable angina ([place])   . Ischemic cardiomyopathy   . Former smoker   . Protein-calorie malnutrition, severe 07/18/2017  . Uncontrolled type 2 diabetes with neuropathy ([place]) 12/28/2014  . COPD (chronic obstructive pulmonary disease) ([place]) 12/28/2014  . GERD (gastroesophageal reflux disease) 12/28/2014  . History of shingles 12/28/2014  . Hyperlipidemia LDL goal <70 12/28/2014    Past Surgical History:  Procedure Laterality Date  . ABDOMINAL HYSTERECTOMY     partial  . CORONARY BALLOON ANGIOPLASTY N/A 05/22/2018   Procedure: CORONARY BALLOON ANGIOPLASTY;  Surgeon: Wellington Hampshire, MD;  Location: [place] [facility];  Service: Cardiovascular;  Laterality: N/A;  . CORONARY STENT INTERVENTION N/A 12/26/2017   Procedure: CORONARY STENT INTERVENTION;  Surgeon: Wellington Hampshire, MD;  Location: [place]  [facility];  Service: Cardiovascular;  Laterality: N/A;  . CORONARY STENT INTERVENTION N/A 02/08/2018   Procedure: CORONARY STENT INTERVENTION;  Surgeon: Nelva Bush, MD;  Location: [place] [facility];  Service: Cardiovascular;  Laterality: N/A;  . CORONARY/GRAFT ACUTE MI REVASCULARIZATION N/A 12/19/2017   Procedure: Coronary/Graft Acute MI Revascularization;  Surgeon: Wellington Hampshire, MD;  Location: [place] [facility];  Service: Cardiovascular;  Laterality: N/A;  . ECTOPIC PREGNANCY SURGERY Left   . INTRAVASCULAR ULTRASOUND/IVUS N/A 02/08/2018   Procedure: Intravascular Ultrasound/IVUS;  Surgeon: Nelva Bush, MD;  Location: [place] [facility];  Service: Cardiovascular;  Laterality: N/A;  . LEFT HEART CATH AND CORONARY ANGIOGRAPHY N/A 12/19/2017   Procedure: LEFT HEART CATH AND CORONARY ANGIOGRAPHY;  Surgeon: Wellington Hampshire, MD;  Location: [place] [facility];  Service: Cardiovascular;  Laterality: N/A;  . LEFT HEART CATH AND CORONARY ANGIOGRAPHY N/A 12/26/2017   Procedure: LEFT HEART CATH AND CORONARY ANGIOGRAPHY;  Surgeon: Wellington Hampshire, MD;  Location: [place] [facility];  Service: Cardiovascular;  Laterality: N/A;  . LEFT HEART CATH AND CORONARY ANGIOGRAPHY N/A 02/08/2018   Procedure: LEFT HEART CATH AND CORONARY ANGIOGRAPHY;  Surgeon: Nelva Bush, MD;  Location: [place] [facility];  Service: Cardiovascular;  Laterality: N/A;  . LEFT HEART CATH AND CORONARY ANGIOGRAPHY N/A 05/22/2018   Procedure: LEFT HEART CATH AND CORONARY ANGIOGRAPHY;  Surgeon: Wellington Hampshire, MD;  Location: [place] [facility];  Service: Cardiovascular;  Laterality: N/A;  . LEFT HEART CATH AND CORONARY ANGIOGRAPHY N/A 06/15/2018   Procedure: LEFT HEART CATH AND CORONARY ANGIOGRAPHY;  Surgeon: Wellington Hampshire, MD;  Location: [place] [facility];  Service: Cardiovascular;  Laterality: N/A;  . LYMPHADENECTOMY    . SPLENECTOMY, PARTIAL    . vulvulasectomy      Prior to Admission medications   Medication Sig Start Date End Date Taking? Authorizing Provider  acetaminophen (TYLENOL) 325 MG tablet Take 650 mg by mouth every 6 (six) hours as needed for mild pain.     [provider]  albuterol (PROAIR HFA) 108 (90 Base) MCG/ACT inhaler Inhale 1 puff into the lungs every 6 (six) hours as needed for wheezing or shortness of breath.     [provider]    amoxicillin-clavulanate (AUGMENTIN) 875-125 MG tablet Take 1 tablet by mouth every 12 (twelve) hours for 10 days. 08/16/18 08/26/18  Gregor Hams, MD  aspirin 81 MG tablet Take 81 mg by mouth daily.    [provider]  atorvastatin (LIPITOR) 80 MG tablet Take 1 tablet (80 mg total) by mouth daily at 6 PM. 01/10/18 03/25/19  Theora Gianotti, NP  citalopram (CELEXA) 20 MG tablet Take 1 tablet (20 mg total) by mouth daily. 03/29/18   Karamalegos, Devonne Doughty, DO  cyclobenzaprine (FLEXERIL) 5 MG tablet Take 1 tablet (5 mg total) by mouth 3 (three) times daily as needed for muscle spasms. 06/16/18   Nicholes Mango, MD  esomeprazole (NEXIUM) 40 MG capsule Take 40 mg by mouth daily.     [provider]  insulin aspart (NOVOLOG) 100 UNIT/ML injection Inject 25 Units into the skin 3 (three) times daily before meals. Take 10 units PLUS sliding scale Patient taking differently: Inject 35 Units into the skin 3 (three) times daily before meals. Take 10 units PLUS sliding scale  05/29/18 08/02/18  Bettey Costa, MD  insulin glargine (LANTUS) 100 UNIT/ML injection Inject 0.55 mLs (55 Units total) into the skin 2 (two) times daily. Patient taking  differently: Inject 60 Units into the skin 2 (two) times daily.  06/16/18 08/02/18  Nicholes Mango, MD  isosorbide mononitrate (IMDUR) 30 MG 24 hr tablet Take 1 tablet (30 mg total) by mouth daily. 07/13/18 10/11/18  Rise Mu, PA-C  ketoconazole (NIZORAL) 2 % cream Apply 1 application topically daily. 07/12/18   Chrystal, Eulas Post, MD  LORazepam (ATIVAN) 1 MG tablet Take 1 tablet (1 mg total) by mouth 2 (two) times daily as needed for anxiety. 05/31/18   Karamalegos, Devonne Doughty, DO  mirtazapine (REMERON) 15 MG tablet Take 15 mg by mouth at bedtime. 07/07/18   [provider]  nitroGLYCERIN (NITROSTAT) 0.4 MG SL tablet Place 1 tablet (0.4 mg total) under the tongue every 5 (five) minutes as needed for chest pain. Patient not taking: Reported on  08/02/2018 06/16/18   Nicholes Mango, MD  nystatin (MYCOSTATIN/NYSTOP) powder Apply topically 3 (three) times daily. 07/05/18   Gillis Ends, MD  ondansetron (ZOFRAN ODT) 4 MG disintegrating tablet Take 1 tablet (4 mg total) by mouth every 8 (eight) hours as needed for nausea or vomiting. 06/26/18   Lavonia Drafts, MD  oxyCODONE (OXY IR/ROXICODONE) 5 MG immediate release tablet Take 1 tablet (5 mg total) by mouth every 6 (six) hours as needed for severe pain. 08/02/18   Verlon Au, NP  oxyCODONE-acetaminophen (PERCOCET) 5-325 MG tablet Take 1 tablet by mouth every 4 (four) hours as needed for up to 6 doses. 08/16/18   Gregor Hams, MD  oxyCODONE-acetaminophen (PERCOCET/ROXICET) 5-325 MG tablet Take 1 tablet by mouth every 6 (six) hours as needed for moderate pain or severe pain. 07/28/18   Dustin Flock, MD  promethazine (PHENERGAN) 25 MG tablet Take 25 mg by mouth every 6 (six) hours as needed for nausea. for nausea 05/15/18   [provider]  ranolazine (RANEXA) 1000 MG SR tablet Take 1 tablet (1,000 mg total) by mouth 2 (two) times daily. 06/16/18   Nicholes Mango, MD  ticagrelor (BRILINTA) 90 MG TABS tablet Take 90 mg by mouth 2 (two) times daily.    [provider]    Allergies Bee venom; Metformin and related; Darvon [propoxyphene]; Gabapentin; Nsaids; Tramadol; and Contrast media [iodinated diagnostic agents]  Family History  Problem Relation Age of Onset  . Non-Hodgkin's lymphoma Mother   . Osteoporosis Mother   . Lupus Sister   . Heart disease Brother   . Heart attack Brother   . Heart attack Father     Social History Social History   Tobacco Use  . Smoking status: Former Smoker    Packs/day: 0.50    Years: 30.00    Pack years: 15.00    Types: Cigarettes    Last attempt to quit: 12/19/2017    Years since quitting: 0.6  . Smokeless tobacco: Never Used  . Tobacco comment: quit 12/19/2017.  Substance Use Topics  . Alcohol use: No  . Drug use: No      Review of Systems Constitutional: No fever/chills Eyes: No visual changes. ENT: No sore throat.  Positive for dental pain Cardiovascular: Denies chest pain. Respiratory: Denies shortness of breath. Gastrointestinal: No abdominal pain.  No nausea, no vomiting.  No diarrhea.  No constipation. Genitourinary: Negative for dysuria. Musculoskeletal: Negative for neck pain.  Negative for back pain. Integumentary: Negative for rash. Neurological: Negative for headaches, focal weakness or numbness.   ____________________________________________   PHYSICAL EXAM:  VITAL SIGNS: ED Triage Vitals  Enc Vitals Group     BP 08/15/18 2227 Marland Kitchen)  120/58     Pulse Rate 08/15/18 2227 71     Resp 08/15/18 2227 18     Temp 08/15/18 2227 97.6 F (36.4 C)     Temp Source 08/15/18 2227 Oral     SpO2 08/15/18 2227 98 %     Weight 08/15/18 2226 61.7 kg (136 lb)     Height 08/15/18 2226 1.549 m (5\' 1" )     Head Circumference --      Peak Flow --      Pain Score 08/15/18 2226 8     Pain Loc --      Pain Edu? --      Excl. in [place]? --     Constitutional: Alert and oriented. Well appearing and in no acute distress. Eyes: Conjunctivae are normal.  Head: Atraumatic. Mouth/Throat: Mucous membranes are moist. Oropharynx non-erythematous.  Dental caries left mandibular central incisor with associated gum swelling.  No evidence of Ludwig's angina Neck: No stridor.   Cardiovascular: Normal rate, regular rhythm. Good peripheral circulation. Grossly normal heart sounds. Respiratory: Normal respiratory effort.  No retractions. Lungs CTAB. Neurologic:  Normal speech and language. No gross focal neurologic deficits are appreciated.  Skin:  Skin is warm, dry and intact. No rash noted. Psychiatric: Mood and affect are normal. Speech and behavior are normal.  _  Procedures   ____________________________________________   INITIAL IMPRESSION / MDM / [place] / ED COURSE  As part of my medical  decision making, I reviewed the following data within the electronic MEDICAL RECORD NUMBER   53 year old female presented with above-stated history and physical exam consistent with dental caries.  Patient given Augmentin Percocet and viscous lidocaine emergency department improvement of pain.  Patient referred to dentist for further outpatient evaluation and management.  Patient prescribed Augmentin  ____________________________________________  FINAL CLINICAL IMPRESSION(S) / ED DIAGNOSES  Final diagnoses:  Dental caries     MEDICATIONS GIVEN DURING THIS VISIT:  Medications  lidocaine (XYLOCAINE) 2 % viscous mouth solution 15 mL (15 mLs Mouth/Throat Given 08/16/18 0101)  amoxicillin-clavulanate (AUGMENTIN) 875-125 MG per tablet 1 tablet (1 tablet Oral Given 08/16/18 0110)  oxyCODONE-acetaminophen (PERCOCET/ROXICET) 5-325 MG per tablet 1 tablet (1 tablet Oral Given 08/16/18 0110)     ED Discharge Orders         Ordered    amoxicillin-clavulanate (AUGMENTIN) 875-125 MG tablet  Every 12 hours     08/16/18 0115    oxyCODONE-acetaminophen (PERCOCET) 5-325 MG tablet  Every 4 hours PRN     08/16/18 0115           Note:  This document was prepared using Dragon voice recognition software and may include unintentional dictation errors.   Gregor Hams, MD 08/16/18 [phone]

## 2018-08-17 ENCOUNTER — Telehealth: Payer: Self-pay

## 2018-08-17 NOTE — Telephone Encounter (Signed)
Call placed to Renee Mack regarding her palliative care referral. She was scheduled at [facility] 3/23. I offered to have her seen sooner and closer to [facility], for the same services. She declined stating since they have already set it up, she will just keep it there. I inquired if she had been scheduled with the [facility]. She stated she completed the online portion and they keep telling her she needs a referral? She has not been scheduled.

## 2018-08-18 ENCOUNTER — Telehealth: Payer: Self-pay

## 2018-08-18 NOTE — Telephone Encounter (Signed)
I have reached out to our pain management team and inquired if they manage oral medication. The response was that they do. I have asked them to reopen the referral for Renee Mack so she can be seen more locally. Referral will undergo physician review. I have obtained the referral form for [facility] and will HOLD this until review from ur pain management is complete.

## 2018-08-21 ENCOUNTER — Encounter: Payer: Self-pay | Admitting: Nurse Practitioner

## 2018-08-21 NOTE — Telephone Encounter (Signed)
Referral sent to [facility]. In the interim, she can be managed by Palliative Care at [facility]. Called patient to discuss. No answer. Message left to return call.

## 2018-08-21 NOTE — Progress Notes (Signed)
Referral faxed to [facility] New Patient Referral and last 3 office notes included.

## 2018-08-27 ENCOUNTER — Other Ambulatory Visit: Payer: Self-pay

## 2018-08-27 ENCOUNTER — Encounter: Payer: Self-pay | Admitting: Emergency Medicine

## 2018-08-27 DIAGNOSIS — Z8544 Personal history of malignant neoplasm of other female genital organs: Secondary | ICD-10-CM | POA: Insufficient documentation

## 2018-08-27 DIAGNOSIS — I252 Old myocardial infarction: Secondary | ICD-10-CM | POA: Diagnosis not present

## 2018-08-27 DIAGNOSIS — E119 Type 2 diabetes mellitus without complications: Secondary | ICD-10-CM | POA: Insufficient documentation

## 2018-08-27 DIAGNOSIS — Z79899 Other long term (current) drug therapy: Secondary | ICD-10-CM | POA: Diagnosis not present

## 2018-08-27 DIAGNOSIS — Z794 Long term (current) use of insulin: Secondary | ICD-10-CM | POA: Diagnosis not present

## 2018-08-27 DIAGNOSIS — R1084 Generalized abdominal pain: Secondary | ICD-10-CM | POA: Diagnosis not present

## 2018-08-27 DIAGNOSIS — Z87891 Personal history of nicotine dependence: Secondary | ICD-10-CM | POA: Insufficient documentation

## 2018-08-27 DIAGNOSIS — R112 Nausea with vomiting, unspecified: Secondary | ICD-10-CM | POA: Diagnosis not present

## 2018-08-27 DIAGNOSIS — Z7982 Long term (current) use of aspirin: Secondary | ICD-10-CM | POA: Insufficient documentation

## 2018-08-27 DIAGNOSIS — R109 Unspecified abdominal pain: Secondary | ICD-10-CM | POA: Diagnosis present

## 2018-08-27 DIAGNOSIS — I2510 Atherosclerotic heart disease of native coronary artery without angina pectoris: Secondary | ICD-10-CM | POA: Insufficient documentation

## 2018-08-27 DIAGNOSIS — I5022 Chronic systolic (congestive) heart failure: Secondary | ICD-10-CM | POA: Diagnosis not present

## 2018-08-27 LAB — CBC
HCT: 46.2 % — ABNORMAL HIGH (ref 36.0–46.0)
Hemoglobin: 15.5 g/dL — ABNORMAL HIGH (ref 12.0–15.0)
MCH: 31.8 pg (ref 26.0–34.0)
MCHC: 33.5 g/dL (ref 30.0–36.0)
MCV: 94.7 fL (ref 80.0–100.0)
Platelets: 237 10*3/uL (ref 150–400)
RBC: 4.88 MIL/uL (ref 3.87–5.11)
RDW: 13.2 % (ref 11.5–15.5)
WBC: 9.1 10*3/uL (ref 4.0–10.5)
nRBC: 0 % (ref 0.0–0.2)

## 2018-08-27 LAB — URINALYSIS, COMPLETE (UACMP) WITH MICROSCOPIC
BACTERIA UA: NONE SEEN
Bilirubin Urine: NEGATIVE
Glucose, UA: >=500 mg/dL — AB
Ketones, ur: NEGATIVE mg/dL
Nitrite: NEGATIVE
Protein, ur: NEGATIVE mg/dL
Specific Gravity, Urine: 1.023 (ref 1.005–1.030)
Squamous Epithelial / HPF: NONE SEEN (ref 0–5)
pH: 6 (ref 5.0–8.0)

## 2018-08-27 LAB — COMPREHENSIVE METABOLIC PANEL
ALBUMIN: 3.9 g/dL (ref 3.5–5.0)
ALT: 13 U/L (ref 0–44)
AST: 20 U/L (ref 15–41)
Alkaline Phosphatase: 79 U/L (ref 38–126)
Anion gap: 8 (ref 5–15)
BUN: 17 mg/dL (ref 6–20)
CO2: 26 mmol/L (ref 22–32)
Calcium: 9 mg/dL (ref 8.9–10.3)
Chloride: 100 mmol/L (ref 98–111)
Creatinine, Ser: 0.53 mg/dL (ref 0.44–1.00)
GFR calc Af Amer: >60 mL/min (ref >60)
GFR calc non Af Amer: >60 mL/min (ref >60)
Glucose, Bld: 455 mg/dL — ABNORMAL HIGH (ref 70–99)
Potassium: 5.1 mmol/L (ref 3.5–5.1)
Sodium: 134 mmol/L — ABNORMAL LOW (ref 135–145)
Total Bilirubin: 0.6 mg/dL (ref 0.3–1.2)
Total Protein: 7.5 g/dL (ref 6.5–8.1)

## 2018-08-27 LAB — LIPASE, BLOOD: Lipase: 22 U/L (ref 11–51)

## 2018-08-27 LAB — GLUCOSE, CAPILLARY: Glucose-Capillary: 421 mg/dL — ABNORMAL HIGH (ref 70–99)

## 2018-08-27 LAB — TROPONIN I

## 2018-08-27 MED ORDER — SODIUM CHLORIDE 0.9 % IV BOLUS
1000.0000 mL | Freq: Once | INTRAVENOUS | Status: AC
  Administered 2018-08-27: 1000 mL via INTRAVENOUS

## 2018-08-27 MED ORDER — ONDANSETRON HCL 4 MG/2ML IJ SOLN
4.0000 mg | Freq: Once | INTRAMUSCULAR | Status: AC
  Administered 2018-08-27: 4 mg via INTRAVENOUS
  Filled 2018-08-27: qty 2

## 2018-08-27 MED ORDER — SODIUM CHLORIDE 0.9% FLUSH: 3.0000 mL | Freq: Once | INTRAVENOUS | Status: DC

## 2018-08-27 NOTE — ED Triage Notes (Signed)
Pt reports abd pain since last evening; pt says she has been vomiting all day; diabetic with history of DKA; also history of pancreatitis; pt says pain is epigastric and radiates through to her back; denies diarrhea;

## 2018-08-28 ENCOUNTER — Telehealth: Payer: Self-pay | Admitting: Family Medicine

## 2018-08-28 ENCOUNTER — Other Ambulatory Visit: Payer: Self-pay

## 2018-08-28 ENCOUNTER — Other Ambulatory Visit: Payer: Self-pay | Admitting: Family Medicine

## 2018-08-28 ENCOUNTER — Emergency Department
Admission: EM | Admit: 2018-08-28 | Discharge: 2018-08-28 | Disposition: A | Discharge status: Home / Self Care | Payer: Medicaid Other | Attending: Emergency Medicine | Admitting: Emergency Medicine

## 2018-08-28 ENCOUNTER — Emergency Department: Payer: Medicaid Other

## 2018-08-28 DIAGNOSIS — IMO0002 Reserved for concepts with insufficient information to code with codable children: Secondary | ICD-10-CM

## 2018-08-28 DIAGNOSIS — R1084 Generalized abdominal pain: Secondary | ICD-10-CM

## 2018-08-28 DIAGNOSIS — E1165 Type 2 diabetes mellitus with hyperglycemia: Principal | ICD-10-CM

## 2018-08-28 DIAGNOSIS — R112 Nausea with vomiting, unspecified: Secondary | ICD-10-CM

## 2018-08-28 DIAGNOSIS — E1140 Type 2 diabetes mellitus with diabetic neuropathy, unspecified: Secondary | ICD-10-CM

## 2018-08-28 LAB — GLUCOSE, CAPILLARY: Glucose-Capillary: 368 mg/dL — ABNORMAL HIGH (ref 70–99)

## 2018-08-28 MED ORDER — LIDOCAINE VISCOUS HCL 2 % MT SOLN
15.0000 mL | Freq: Once | OROMUCOSAL | Status: AC
  Administered 2018-08-28: 15 mL via ORAL
  Filled 2018-08-28: qty 15

## 2018-08-28 MED ORDER — FAMOTIDINE 20 MG PO TABS: 20.0000 mg | ORAL_TABLET | Freq: Two times a day (BID) | ORAL | 0 refills | Status: DC

## 2018-08-28 MED ORDER — MORPHINE SULFATE (PF) 4 MG/ML IV SOLN
4.0000 mg | Freq: Once | INTRAVENOUS | Status: AC
  Administered 2018-08-28: 4 mg via INTRAVENOUS
  Filled 2018-08-28: qty 1

## 2018-08-28 MED ORDER — ALUM & MAG HYDROXIDE-SIMETH 200-200-20 MG/5ML PO SUSP
30.0000 mL | Freq: Once | ORAL | Status: AC
  Administered 2018-08-28: 30 mL via ORAL
  Filled 2018-08-28: qty 30

## 2018-08-28 MED ORDER — ONDANSETRON 4 MG PO TBDP: 4.0000 mg | ORAL_TABLET | Freq: Three times a day (TID) | ORAL | 0 refills | Status: DC | PRN

## 2018-08-28 MED ORDER — SODIUM CHLORIDE 0.9 % IV BOLUS: 1000.0000 mL | Freq: Once | INTRAVENOUS | Status: DC

## 2018-08-28 MED ORDER — FENTANYL CITRATE (PF) 100 MCG/2ML IJ SOLN
50.0000 ug | INTRAMUSCULAR | Status: DC | PRN
  Administered 2018-08-28: 50 ug via INTRAVENOUS
  Filled 2018-08-28: qty 2

## 2018-08-28 MED ORDER — FAMOTIDINE IN NACL 20-0.9 MG/50ML-% IV SOLN
20.0000 mg | Freq: Once | INTRAVENOUS | Status: AC
  Administered 2018-08-28: 20 mg via INTRAVENOUS
  Filled 2018-08-28: qty 50

## 2018-08-28 MED ORDER — ONDANSETRON HCL 4 MG/2ML IJ SOLN
4.0000 mg | Freq: Once | INTRAMUSCULAR | Status: AC
  Administered 2018-08-28: 4 mg via INTRAVENOUS
  Filled 2018-08-28: qty 2

## 2018-08-28 MED ORDER — DICYCLOMINE HCL 10 MG PO CAPS: 10.0000 mg | ORAL_CAPSULE | Freq: Three times a day (TID) | ORAL | 0 refills | Status: DC | PRN

## 2018-08-28 NOTE — Telephone Encounter (Signed)
Pt. called requesting refill on  Lorazepam  1mg   Called into  [facility] [address]

## 2018-08-28 NOTE — ED Provider Notes (Signed)
[facility] Emergency Department Provider Note  ____________________________________________  Time seen: Approximately 3:55 AM  I have reviewed the triage vital signs and the nursing notes.   HISTORY  Chief Complaint Abdominal Pain; Hyperglycemia; and Emesis   HPI Renee Mack is a 53 y.o. female with a history of CHF with a EF of 30 to 35%, diabetes, pancreatitis, CAD status post stent, PUD, smoking who presents for evaluation of abdominal pain.  Patient reports 2 days of diffuse upper abdominal pain that she describes as sharp radiating to her back associated with nausea and several episodes of nonbloody nonbilious emesis.  Patient denies abdominal distention, constipation, diarrhea.  She has had dysuria for the last few days.  She is also noticed that her sugars were trending up.  She denies fever chills, chest pain or shortness of breath.  Currently her pain is 8 out of 10. Patient denies NSAIDs or alcohol use  Past Medical History:  Diagnosis Date  . Cancer ([place])    vulvular  . Cervical disc disease    a. 01/2018 MRI Cervical spine: Cervical spondylosis with multilevel disc and facet degeneration greatest at C5-6 and C6-7.  Moderate to sev R C5-6, mod L C5-6, and mod bilat C6-7 foraminal stenosis with multilevel mild foraminal stenosis.  Mild C5-6 and mod C6-7 canal stenosis. C6-7 central cord impingement w/ cord flattening.  . Chronic combined systolic (congestive) and diastolic (congestive) heart failure ([place])    a. 12/2017 Echo: EF 30-35%, mid-apicalanteroseptal, ant, and apical AK. Gr1 DD. Mild conc LVH; b. 03/2018 Echo: EF 45-50%, antsept, ant HK. Mild MR. Nl LA size. Nl RV fxn.  Marland Kitchen COPD (chronic obstructive pulmonary disease) (HCC)    not on home oxygen  . Coronary artery disease    a. 12/2017 ACS/PCI: LAD 100p (2.25x26 Resolute Onyx DES), 73m (2.0x12 Resolute Onyx DES), 80d (2.0x15 Resolute Onyx DES), RCA 90p (non-dominant). EF 25-35%. Post-MI course  complicated by CGS; b. 11/2692 NSTEMI/subacute thrombosis-->LAD 100 (PTCA + DES x 1); c. 01/2018 NSTEMI/PCI: LM min irregs, LAD 50-60p ISR/hazy (3.5x12 Resolute DES), 8m/d (underexpansion of prior stents-->PTCA). EF 45-50%.  . Diabetes mellitus without complication ([place])   . GERD (gastroesophageal reflux disease)   . H/O degenerative disc disease   . Hypotension   . Ischemic cardiomyopathy    a. 12/2017 Echo: EF 30-35%; b. 03/2018 Echo: EF 45-50%.  . Myocardial infarction ([place])    a. 12/2017-->DES to LAD x 3.  . Pancreatitis   . Shingles   . Spinal stenosis   . Tobacco abuse   . Ulcer (traumatic) of oral mucosa   . Urinary incontinence   . VIN III (vulvar intraepithelial neoplasia III)     Patient Active Problem List   Diagnosis Date Noted  . Hyperglycemia 05/28/2018  . Vulvar intraepithelial neoplasia (VIN) grade 3 05/24/2018  . History of ST elevation myocardial infarction (STEMI) 02/27/2018  . CAD (coronary artery disease) 02/27/2018  . Hyperlipidemia associated with type 2 diabetes mellitus ([place]) 02/27/2018  . Anxiety associated with depression 02/27/2018  . Chronic systolic heart failure ([place])   . Chest pain 12/26/2017  . Unstable angina ([place])   . Ischemic cardiomyopathy   . Former smoker   . Protein-calorie malnutrition, severe 07/18/2017  . Uncontrolled type 2 diabetes with neuropathy ([place]) 12/28/2014  . COPD (chronic obstructive pulmonary disease) ([place]) 12/28/2014  . GERD (gastroesophageal reflux disease) 12/28/2014  . History of shingles 12/28/2014  . Hyperlipidemia LDL goal <70 12/28/2014    Past Surgical  History:  Procedure Laterality Date  . ABDOMINAL HYSTERECTOMY     partial  . CORONARY BALLOON ANGIOPLASTY N/A 05/22/2018   Procedure: CORONARY BALLOON ANGIOPLASTY;  Surgeon: Wellington Hampshire, MD;  Location: [place] [facility];  Service: Cardiovascular;  Laterality: N/A;  . CORONARY STENT INTERVENTION N/A 12/26/2017   Procedure: CORONARY STENT INTERVENTION;   Surgeon: Wellington Hampshire, MD;  Location: [place] [facility];  Service: Cardiovascular;  Laterality: N/A;  . CORONARY STENT INTERVENTION N/A 02/08/2018   Procedure: CORONARY STENT INTERVENTION;  Surgeon: Nelva Bush, MD;  Location: [place] [facility];  Service: Cardiovascular;  Laterality: N/A;  . CORONARY/GRAFT ACUTE MI REVASCULARIZATION N/A 12/19/2017   Procedure: Coronary/Graft Acute MI Revascularization;  Surgeon: Wellington Hampshire, MD;  Location: [place] [facility];  Service: Cardiovascular;  Laterality: N/A;  . ECTOPIC PREGNANCY SURGERY Left   . INTRAVASCULAR ULTRASOUND/IVUS N/A 02/08/2018   Procedure: Intravascular Ultrasound/IVUS;  Surgeon: Nelva Bush, MD;  Location: [place] [facility];  Service: Cardiovascular;  Laterality: N/A;  . LEFT HEART CATH AND CORONARY ANGIOGRAPHY N/A 12/19/2017   Procedure: LEFT HEART CATH AND CORONARY ANGIOGRAPHY;  Surgeon: Wellington Hampshire, MD;  Location: [place] [facility];  Service: Cardiovascular;  Laterality: N/A;  . LEFT HEART CATH AND CORONARY ANGIOGRAPHY N/A 12/26/2017   Procedure: LEFT HEART CATH AND CORONARY ANGIOGRAPHY;  Surgeon: Wellington Hampshire, MD;  Location: [place] [facility];  Service: Cardiovascular;  Laterality: N/A;  . LEFT HEART CATH AND CORONARY ANGIOGRAPHY N/A 02/08/2018   Procedure: LEFT HEART CATH AND CORONARY ANGIOGRAPHY;  Surgeon: Nelva Bush, MD;  Location: [place] [facility];  Service: Cardiovascular;  Laterality: N/A;  . LEFT HEART CATH AND CORONARY ANGIOGRAPHY N/A 05/22/2018   Procedure: LEFT HEART CATH AND CORONARY ANGIOGRAPHY;  Surgeon: Wellington Hampshire, MD;  Location: [place] [facility];  Service: Cardiovascular;  Laterality: N/A;  . LEFT HEART CATH AND CORONARY ANGIOGRAPHY N/A 06/15/2018   Procedure: LEFT HEART CATH AND CORONARY ANGIOGRAPHY;  Surgeon: Wellington Hampshire, MD;  Location: [place] [facility];  Service: Cardiovascular;  Laterality: N/A;  . LYMPHADENECTOMY    . SPLENECTOMY, PARTIAL    .  vulvulasectomy      Prior to Admission medications   Medication Sig Start Date End Date Taking? Authorizing Provider  acetaminophen (TYLENOL) 325 MG tablet Take 650 mg by mouth every 6 (six) hours as needed for mild pain.    Yes [provider]  albuterol (PROAIR HFA) 108 (90 Base) MCG/ACT inhaler Inhale 1 puff into the lungs every 6 (six) hours as needed for wheezing or shortness of breath.    Yes [provider]  aspirin 81 MG tablet Take 81 mg by mouth daily.   Yes [provider]  atorvastatin (LIPITOR) 80 MG tablet Take 1 tablet (80 mg total) by mouth daily at 6 PM. 01/10/18 03/25/19 Yes Theora Gianotti, NP  citalopram (CELEXA) 20 MG tablet Take 1 tablet (20 mg total) by mouth daily. 03/29/18  Yes Karamalegos, Devonne Doughty, DO  cyclobenzaprine (FLEXERIL) 5 MG tablet Take 1 tablet (5 mg total) by mouth 3 (three) times daily as needed for muscle spasms. 06/16/18  Yes Gouru, Illene Silver, MD  esomeprazole (NEXIUM) 40 MG capsule Take 40 mg by mouth daily.    Yes [provider]  insulin aspart (NOVOLOG) 100 UNIT/ML injection Inject 25 Units into the skin 3 (three) times daily before meals. Take 10 units PLUS sliding scale Patient taking differently: Inject 35 Units into the  skin 3 (three) times daily before meals. Take 10 units PLUS sliding scale  05/29/18 08/27/18 Yes Mody, Sital, MD  insulin glargine (LANTUS) 100 UNIT/ML injection Inject 0.55 mLs (55 Units total) into the skin 2 (two) times daily. Patient taking differently: Inject 60 Units into the skin 2 (two) times daily.  06/16/18 08/27/18 Yes Gouru, Illene Silver, MD  isosorbide mononitrate (IMDUR) 30 MG 24 hr tablet Take 1 tablet (30 mg total) by mouth daily. 07/13/18 10/11/18 Yes Dunn, Areta Haber, PA-C  LORazepam (ATIVAN) 1 MG tablet Take 1 tablet (1 mg total) by mouth 2 (two) times daily as needed for anxiety. 05/31/18  Yes Karamalegos, Devonne Doughty, DO  mirtazapine (REMERON) 15 MG tablet Take 15 mg by mouth at bedtime.  07/07/18  Yes [provider]  nitroGLYCERIN (NITROSTAT) 0.4 MG SL tablet Place 1 tablet (0.4 mg total) under the tongue every 5 (five) minutes as needed for chest pain. 06/16/18  Yes Gouru, Illene Silver, MD  oxyCODONE (OXY IR/ROXICODONE) 5 MG immediate release tablet Take 1 tablet (5 mg total) by mouth every 6 (six) hours as needed for severe pain. 08/02/18  Yes Verlon Au, NP  promethazine (PHENERGAN) 25 MG tablet Take 25 mg by mouth every 6 (six) hours as needed for nausea. for nausea 05/15/18  Yes [provider]  ranolazine (RANEXA) 1000 MG SR tablet Take 1 tablet (1,000 mg total) by mouth 2 (two) times daily. 06/16/18  Yes Gouru, Illene Silver, MD  ticagrelor (BRILINTA) 90 MG TABS tablet Take 90 mg by mouth 2 (two) times daily.   Yes [provider]  dicyclomine (BENTYL) 10 MG capsule Take 1 capsule (10 mg total) by mouth 3 (three) times daily as needed for up to 14 days for spasms. 08/28/18 09/11/18  Rudene Re, MD  famotidine (PEPCID) 20 MG tablet Take 1 tablet (20 mg total) by mouth 2 (two) times daily. 08/28/18 08/28/19  Rudene Re, MD  ondansetron (ZOFRAN ODT) 4 MG disintegrating tablet Take 1 tablet (4 mg total) by mouth every 8 (eight) hours as needed. 08/28/18   Rudene Re, MD    Allergies Bee venom; Metformin and related; Darvon [propoxyphene]; Gabapentin; Nsaids; Tramadol; and Contrast media [iodinated diagnostic agents]  Family History  Problem Relation Age of Onset  . Non-Hodgkin's lymphoma Mother   . Osteoporosis Mother   . Lupus Sister   . Heart disease Brother   . Heart attack Brother   . Heart attack Father     Social History Social History   Tobacco Use  . Smoking status: Former Smoker    Packs/day: 0.50    Years: 30.00    Pack years: 15.00    Types: Cigarettes    Last attempt to quit: 12/19/2017    Years since quitting: 0.6  . Smokeless tobacco: Never Used  . Tobacco comment: quit 12/19/2017.  Substance Use Topics  . Alcohol use:  No  . Drug use: No    Review of Systems  Constitutional: Negative for fever. Eyes: Negative for visual changes. ENT: Negative for sore throat. Neck: No neck pain  Cardiovascular: Negative for chest pain. Respiratory: Negative for shortness of breath. Gastrointestinal: + abdominal pain, nausea, and vomiting. No diarrhea. Genitourinary: Negative for dysuria. Musculoskeletal: Negative for back pain. Skin: Negative for rash. Neurological: Negative for headaches, weakness or numbness. Psych: No SI or HI  ____________________________________________   PHYSICAL EXAM:  VITAL SIGNS: ED Triage Vitals  Enc Vitals Group     BP 08/27/18 2126 113/63     Pulse  Rate 08/27/18 2126 66     Resp 08/27/18 2126 18     Temp 08/27/18 2126 98 F (36.7 C)     Temp Source 08/27/18 2126 Oral     SpO2 08/27/18 2126 96 %     Weight 08/27/18 2126 138 lb (62.6 kg)     Height 08/27/18 2126 5\' 1"  (1.549 m)     Head Circumference --      Peak Flow --      Pain Score 08/27/18 2134 8     Pain Loc --      Pain Edu? --      Excl. in [place]? --     Constitutional: Alert and oriented, looks uncomfortable.  HEENT:      Head: Normocephalic and atraumatic.         Eyes: Conjunctivae are normal. Sclera is non-icteric.       Mouth/Throat: Mucous membranes are moist.       Neck: Supple with no signs of meningismus. Cardiovascular: Regular rate and rhythm. No murmurs, gallops, or rubs. 2+ symmetrical distal pulses are present in all extremities. No JVD. Respiratory: Normal respiratory effort. Lungs are clear to auscultation bilaterally. No wheezes, crackles, or rhonchi.  Gastrointestinal: Soft, diffuse tenderness to palpation, and non distended with positive bowel sounds. No rebound or guarding. Musculoskeletal: Nontender with normal range of motion in all extremities. No edema, cyanosis, or erythema of extremities. Neurologic: Normal speech and language. Face is symmetric. Moving all extremities. No gross focal  neurologic deficits are appreciated. Skin: Skin is warm, dry and intact. No rash noted. Psychiatric: Mood and affect are normal. Speech and behavior are normal.  ____________________________________________   LABS (all labs ordered are listed, but only abnormal results are displayed)  Labs Reviewed  COMPREHENSIVE METABOLIC PANEL - Abnormal; Notable for the following components:      Result Value   Sodium 134 (*)    Glucose, Bld 455 (*)    All other components within normal limits  CBC - Abnormal; Notable for the following components:   Hemoglobin 15.5 (*)    HCT 46.2 (*)    All other components within normal limits  URINALYSIS, COMPLETE (UACMP) WITH MICROSCOPIC - Abnormal; Notable for the following components:   Color, Urine COLORLESS (*)    APPearance CLEAR (*)    Glucose, UA >=500 (*)    Hgb urine dipstick SMALL (*)    Leukocytes,Ua SMALL (*)    All other components within normal limits  GLUCOSE, CAPILLARY - Abnormal; Notable for the following components:   Glucose-Capillary 421 (*)    All other components within normal limits  GLUCOSE, CAPILLARY - Abnormal; Notable for the following components:   Glucose-Capillary 368 (*)    All other components within normal limits  LIPASE, BLOOD  TROPONIN I  CBG MONITORING, ED  CBG MONITORING, ED   ____________________________________________  EKG  ED ECG REPORT I, Rudene Re, the attending physician, personally viewed and interpreted this ECG.  Normal sinus rhythm, rate of 63, normal intervals, normal axis, no ST elevations or depressions, diffuse T wave flattening with Q waves in anterior leads.  Unchanged from prior. ____________________________________________  RADIOLOGY  I have personally reviewed the images performed during this visit and I agree with the Radiologist's read.   Interpretation by Radiologist:  Ct Abdomen Pelvis Wo Contrast  Result Date: 08/28/2018 CLINICAL DATA:  Epigastric pain radiating to the  right upper quadrant and back. Multiple episodes of emesis. EXAM: CT ABDOMEN AND PELVIS WITHOUT CONTRAST TECHNIQUE:  Multidetector CT imaging of the abdomen and pelvis was performed following the standard protocol without IV contrast. COMPARISON:  07/17/2018 FINDINGS: Lower chest:  Atherosclerotic calcification Hepatobiliary: No focal liver abnormality.No evidence of biliary obstruction or stone. Pancreas: Unremarkable. Spleen: Unremarkable. Adrenals/Urinary Tract: Negative adrenals. No hydronephrosis or stone. Unremarkable bladder. Stomach/Bowel: No obstruction or bowel wall thickening. No appendicitis. Vascular/Lymphatic: No acute vascular abnormality. Atherosclerotic calcification no mass or adenopathy. Reproductive:Postoperative vulva and probable left inguinal dissection. Other: No ascites or pneumoperitoneum. Fatty epigastric hernia. Slightly more inferior midline hernia containing transverse colon. Musculoskeletal: No acute abnormalities. Ordinary degenerative changes in the spine. IMPRESSION: 1. No acute finding or change from prior. No bowel obstruction or evidence of inflammation. 2. Epigastric hernias containing fat and non thickened transverse colon. Electronically Signed   By: Monte Fantasia M.D.   On: 08/28/2018 04:29      ____________________________________________   PROCEDURES  Procedure(s) performed: None Procedures Critical Care performed:  None ____________________________________________   INITIAL IMPRESSION / ASSESSMENT AND PLAN / ED COURSE   53 y.o. female with a history of CHF with a EF of 30 to 35%, diabetes, pancreatitis, CAD status post stent, smoking who presents for evaluation of diffuse upper abdominal pain radiating to her back associated with nausea and vomiting for 2 days.  Patient looks uncomfortable but in no distress, has normal vital signs, abdomen is nondistended with diffuse tenderness to palpation.  Labs showing hyperglycemia with no evidence of DKA, normal  kidney function, normal electrolytes, normal liver enzymes and lipase, normal white count.  Troponin and EKG with no evidence of ischemia.  UA and CT pending.  Differential diagnoses including pancreatitis versus gallbladder versus peptic ulcer disease versus gastritis versus SBO versus diverticulitis versus pyelonephritis.    _________________________ 5:33 AM on 08/28/2018 ----------------------------------------- CT with no acute findings.  Patient feels improved after IV fluids, Pepcid and Zofran.  Most likely peptic ulcer disease versus gastritis.  No evidence of active bleeding.  Sugars have improved after normal saline.  Will discharge home on Pepcid, Zofran and Bentyl.  Discussed return precautions and close follow-up with primary care doctor.     As part of my medical decision making, I reviewed the following data within the [place] notes reviewed and incorporated, Labs reviewed , EKG interpreted , Old EKG reviewed, Old chart reviewed, Radiograph reviewed  Notes from prior ED visits and [place] Controlled Substance Database    Pertinent labs & imaging results that were available during my care of the patient were reviewed by me and considered in my medical decision making (see chart for details).    ____________________________________________   FINAL CLINICAL IMPRESSION(S) / ED DIAGNOSES  Final diagnoses:  Non-intractable vomiting with nausea, unspecified vomiting type  Generalized abdominal pain      NEW MEDICATIONS STARTED DURING THIS VISIT:  ED Discharge Orders         Ordered    famotidine (PEPCID) 20 MG tablet  2 times daily     08/28/18 0529    ondansetron (ZOFRAN ODT) 4 MG disintegrating tablet  Every 8 hours PRN     08/28/18 0529    dicyclomine (BENTYL) 10 MG capsule  3 times daily PRN     08/28/18 0529           Note:  This document was prepared using Dragon voice recognition software and may include unintentional dictation  errors.    Alfred Levins, [place], MD 08/28/18 [phone]

## 2018-08-28 NOTE — Telephone Encounter (Signed)
Please notify patient that I will agree to refill Lorazepam only temporary, for now we can continue, but we need to discuss again in future plan to taper down on dose.  Nobie Putnam, [facility] 08/28/2018, 10:06 PM

## 2018-08-28 NOTE — ED Notes (Signed)
ED Provider at bedside. 

## 2018-08-28 NOTE — ED Notes (Signed)
Reviewed discharge instructions, follow-up care, and prescriptions with patient. Patient verbalized understanding of all information reviewed. Patient stable, with no distress noted at this time.    

## 2018-08-28 NOTE — ED Notes (Signed)
Patient c/o epigastric pain radiating to RUQ and back. Patient c/o N/V, with approx 10 emeses in the last 24 hours. Patient is tender to palpation of abdomen. Patient denies diarrhea; reports one normal stool yesterday.   Patient c/o burning with urination, however, reports hx of vulvar cancer.

## 2018-08-29 ENCOUNTER — Other Ambulatory Visit: Payer: Self-pay

## 2018-08-29 ENCOUNTER — Emergency Department
Admission: EM | Admit: 2018-08-29 | Discharge: 2018-08-29 | Discharge status: Against Medical Advice | Payer: Medicaid Other | Attending: Emergency Medicine | Admitting: Emergency Medicine

## 2018-08-29 ENCOUNTER — Emergency Department: Payer: Medicaid Other

## 2018-08-29 DIAGNOSIS — Z79899 Other long term (current) drug therapy: Secondary | ICD-10-CM | POA: Insufficient documentation

## 2018-08-29 DIAGNOSIS — R1013 Epigastric pain: Secondary | ICD-10-CM | POA: Diagnosis present

## 2018-08-29 DIAGNOSIS — R112 Nausea with vomiting, unspecified: Secondary | ICD-10-CM | POA: Diagnosis not present

## 2018-08-29 DIAGNOSIS — J449 Chronic obstructive pulmonary disease, unspecified: Secondary | ICD-10-CM | POA: Diagnosis not present

## 2018-08-29 DIAGNOSIS — M542 Cervicalgia: Secondary | ICD-10-CM | POA: Insufficient documentation

## 2018-08-29 DIAGNOSIS — Z87891 Personal history of nicotine dependence: Secondary | ICD-10-CM | POA: Insufficient documentation

## 2018-08-29 DIAGNOSIS — I2510 Atherosclerotic heart disease of native coronary artery without angina pectoris: Secondary | ICD-10-CM | POA: Diagnosis not present

## 2018-08-29 DIAGNOSIS — E1165 Type 2 diabetes mellitus with hyperglycemia: Secondary | ICD-10-CM | POA: Diagnosis not present

## 2018-08-29 DIAGNOSIS — Z7982 Long term (current) use of aspirin: Secondary | ICD-10-CM | POA: Insufficient documentation

## 2018-08-29 DIAGNOSIS — I509 Heart failure, unspecified: Secondary | ICD-10-CM | POA: Insufficient documentation

## 2018-08-29 LAB — COMPREHENSIVE METABOLIC PANEL
ALT: 13 U/L (ref 0–44)
AST: 11 U/L — ABNORMAL LOW (ref 15–41)
Albumin: 3.7 g/dL (ref 3.5–5.0)
Alkaline Phosphatase: 75 U/L (ref 38–126)
Anion gap: 12 (ref 5–15)
BUN: 18 mg/dL (ref 6–20)
CO2: 20 mmol/L — ABNORMAL LOW (ref 22–32)
Calcium: 8.8 mg/dL — ABNORMAL LOW (ref 8.9–10.3)
Chloride: 100 mmol/L (ref 98–111)
Creatinine, Ser: 0.49 mg/dL (ref 0.44–1.00)
GFR calc Af Amer: >60 mL/min (ref >60)
GFR calc non Af Amer: >60 mL/min (ref >60)
Glucose, Bld: 385 mg/dL — ABNORMAL HIGH (ref 70–99)
Potassium: 4.4 mmol/L (ref 3.5–5.1)
Sodium: 132 mmol/L — ABNORMAL LOW (ref 135–145)
Total Bilirubin: 1.3 mg/dL — ABNORMAL HIGH (ref 0.3–1.2)
Total Protein: 7.5 g/dL (ref 6.5–8.1)

## 2018-08-29 LAB — URINALYSIS, COMPLETE (UACMP) WITH MICROSCOPIC
Bacteria, UA: NONE SEEN
Bilirubin Urine: NEGATIVE
Glucose, UA: >=500 mg/dL — AB
Ketones, ur: 20 mg/dL — AB
Nitrite: NEGATIVE
Protein, ur: NEGATIVE mg/dL
Specific Gravity, Urine: 1.02 (ref 1.005–1.030)
pH: 5 (ref 5.0–8.0)

## 2018-08-29 LAB — CBC
HCT: 45.8 % (ref 36.0–46.0)
Hemoglobin: 15.3 g/dL — ABNORMAL HIGH (ref 12.0–15.0)
MCH: 31.7 pg (ref 26.0–34.0)
MCHC: 33.4 g/dL (ref 30.0–36.0)
MCV: 95 fL (ref 80.0–100.0)
PLATELETS: 233 10*3/uL (ref 150–400)
RBC: 4.82 MIL/uL (ref 3.87–5.11)
RDW: 13.1 % (ref 11.5–15.5)
WBC: 10.4 10*3/uL (ref 4.0–10.5)
nRBC: 0 % (ref 0.0–0.2)

## 2018-08-29 LAB — LIPASE, BLOOD: Lipase: 16 U/L (ref 11–51)

## 2018-08-29 LAB — GLUCOSE, CAPILLARY: Glucose-Capillary: 283 mg/dL — ABNORMAL HIGH (ref 70–99)

## 2018-08-29 MED ORDER — PROMETHAZINE HCL 25 MG/ML IJ SOLN: 25.0000 mg | Freq: Once | INTRAMUSCULAR | Status: DC

## 2018-08-29 MED ORDER — HALOPERIDOL LACTATE 5 MG/ML IJ SOLN
2.0000 mg | Freq: Once | INTRAMUSCULAR | Status: AC
  Administered 2018-08-29: 2 mg via INTRAVENOUS
  Filled 2018-08-29: qty 1

## 2018-08-29 MED ORDER — METOCLOPRAMIDE HCL 5 MG/ML IJ SOLN
10.0000 mg | Freq: Once | INTRAMUSCULAR | Status: AC
  Administered 2018-08-29: 10 mg via INTRAVENOUS
  Filled 2018-08-29: qty 2

## 2018-08-29 MED ORDER — INSULIN ASPART 100 UNIT/ML ~~LOC~~ SOLN
10.0000 [IU] | Freq: Once | SUBCUTANEOUS | Status: AC
  Administered 2018-08-29: 10 [IU] via INTRAVENOUS
  Filled 2018-08-29: qty 1

## 2018-08-29 MED ORDER — SODIUM CHLORIDE 0.9 % IV BOLUS
1000.0000 mL | Freq: Once | INTRAVENOUS | Status: AC
  Administered 2018-08-29: 1000 mL via INTRAVENOUS

## 2018-08-29 MED ORDER — DIPHENHYDRAMINE HCL 50 MG/ML IJ SOLN
25.0000 mg | Freq: Once | INTRAMUSCULAR | Status: AC
  Administered 2018-08-29: 25 mg via INTRAVENOUS
  Filled 2018-08-29: qty 1

## 2018-08-29 MED ORDER — ACETAMINOPHEN 10 MG/ML IV SOLN
1000.0000 mg | Freq: Four times a day (QID) | INTRAVENOUS | Status: DC
  Administered 2018-08-29: 1000 mg via INTRAVENOUS
  Filled 2018-08-29 (×2): qty 100

## 2018-08-29 NOTE — Progress Notes (Signed)
Inpatient Diabetes Program Recommendations  AACE/ADA: New Consensus Statement on Inpatient Glycemic Control   Target Ranges:  Prepandial:   less than 140 mg/dL      Peak postprandial:   less than 180 mg/dL (1-2 hours)      Critically ill patients:  140 - 180 mg/dL  Results for Renee Mack, Renee Mack (MRN [id number]) as of 08/29/2018 09:45  Ref. Range 08/29/2018 09:08  Glucose-Capillary Latest Ref Range: 70 - 99 mg/dL 283 (H)   Results for Renee Mack, Renee Mack (MRN [id number]) as of 08/29/2018 09:45  Ref. Range 08/29/2018 03:34  Glucose Latest Ref Range: 70 - 99 mg/dL 385 (H)  Results for Renee Mack, Renee Mack (MRN [id number]) as of 08/29/2018 09:45  Ref. Range 07/27/2018 05:27  Hemoglobin A1C Latest Ref Range: 4.8 - 5.6 % 12.6 (H)   Review of Glycemic Control  Diabetes history: DM2 Outpatient Diabetes medications: Lantus 60 units BID, Novolog 35 units TID with meals plus additional units for correction Current orders for Inpatient glycemic control: None; in Emergency Department at this time  Inpatient Diabetes Program Recommendations:   Insulin orders if admitted: If patient is admitted, please consider ordering Lantus 40 units BID, Novolog 10 units TID with meals for meal coverage if patient eats at least 50% of meals, CBGs ACHS, Novolog 0-15 units TID with meals and Novolog 0-5 units QHS.   HgbA1C: A1C 12.6% on 07/27/18. Per Care Everywhere, A1C was 12.7% on 07/18/18. Patient was seen by Inpatient Diabetes team on 05/23/18 during prior hospitalization. Patient is followed by Renae Gloss, NP (works with Dr. Gabriel Carina) and was last seen 07/26/18.   NOTE: Per office note by Renae Gloss, NP on 07/26/18, patient was asked to:  Increase Lantus from 60 units twice daily up to 70 units twice daily (ok to start at 65 units and then increase in a week if needed) Increase Novolog 35 units with meals plus sliding scale: BS 200-250 5 units BS 251-300 - 10 units BS 301-350 - 15 units BS 351- 400 - 20 units BS 401-450 - 25  units  Patient has had 5 Emergency Room visits in the past 3 months and last hospital admission was 07/27/18 to 07/28/18.   Thanks, Barnie Alderman, RN, MSN, CDE Diabetes Coordinator Inpatient Diabetes Program [phone] (Team Pager from 8am to 5pm)

## 2018-08-29 NOTE — ED Provider Notes (Signed)
[facility] Emergency Department Provider Note  ____________________________________________  Time seen: Approximately 7:37 AM  I have reviewed the triage vital signs and the nursing notes.   HISTORY  Chief Complaint Emesis    HPI Renee Mack is a 53 y.o. female with a history of chronic neck pain, cancer, congestive heart failure, COPD, diabetes  who complains of epigastric pain and vomiting since 11:00 PM last night.  She reports there being some blood in her vomit and a history of stomach ulcers.  Denies chest pain shortness of breath fevers or chills.  No diarrhea or black or bloody stool.  Epigastric pain is waxing and waning, nonradiating, no aggravating or alleviating factors.     Past Medical History:  Diagnosis Date  . Cancer ([place])    vulvular  . Cervical disc disease    a. 01/2018 MRI Cervical spine: Cervical spondylosis with multilevel disc and facet degeneration greatest at C5-6 and C6-7.  Moderate to sev R C5-6, mod L C5-6, and mod bilat C6-7 foraminal stenosis with multilevel mild foraminal stenosis.  Mild C5-6 and mod C6-7 canal stenosis. C6-7 central cord impingement w/ cord flattening.  . Chronic combined systolic (congestive) and diastolic (congestive) heart failure ([place])    a. 12/2017 Echo: EF 30-35%, mid-apicalanteroseptal, ant, and apical AK. Gr1 DD. Mild conc LVH; b. 03/2018 Echo: EF 45-50%, antsept, ant HK. Mild MR. Nl LA size. Nl RV fxn.  Marland Kitchen COPD (chronic obstructive pulmonary disease) (HCC)    not on home oxygen  . Coronary artery disease    a. 12/2017 ACS/PCI: LAD 100p (2.25x26 Resolute Onyx DES), 34m (2.0x12 Resolute Onyx DES), 80d (2.0x15 Resolute Onyx DES), RCA 90p (non-dominant). EF 25-35%. Post-MI course complicated by CGS; b. 10/7844 NSTEMI/subacute thrombosis-->LAD 100 (PTCA + DES x 1); c. 01/2018 NSTEMI/PCI: LM min irregs, LAD 50-60p ISR/hazy (3.5x12 Resolute DES), 79m/d (underexpansion of prior stents-->PTCA). EF 45-50%.  .  Diabetes mellitus without complication ([place])   . GERD (gastroesophageal reflux disease)   . H/O degenerative disc disease   . Hypotension   . Ischemic cardiomyopathy    a. 12/2017 Echo: EF 30-35%; b. 03/2018 Echo: EF 45-50%.  . Myocardial infarction ([place])    a. 12/2017-->DES to LAD x 3.  . Pancreatitis   . Shingles   . Spinal stenosis   . Tobacco abuse   . Ulcer (traumatic) of oral mucosa   . Urinary incontinence   . VIN III (vulvar intraepithelial neoplasia III)      Patient Active Problem List   Diagnosis Date Noted  . Hyperglycemia 05/28/2018  . Vulvar intraepithelial neoplasia (VIN) grade 3 05/24/2018  . History of ST elevation myocardial infarction (STEMI) 02/27/2018  . CAD (coronary artery disease) 02/27/2018  . Hyperlipidemia associated with type 2 diabetes mellitus ([place]) 02/27/2018  . Anxiety associated with depression 02/27/2018  . Chronic systolic heart failure ([place])   . Chest pain 12/26/2017  . Unstable angina ([place])   . Ischemic cardiomyopathy   . Former smoker   . Protein-calorie malnutrition, severe 07/18/2017  . Uncontrolled type 2 diabetes with neuropathy ([place]) 12/28/2014  . COPD (chronic obstructive pulmonary disease) ([place]) 12/28/2014  . GERD (gastroesophageal reflux disease) 12/28/2014  . History of shingles 12/28/2014  . Hyperlipidemia LDL goal <70 12/28/2014     Past Surgical History:  Procedure Laterality Date  . ABDOMINAL HYSTERECTOMY     partial  . CORONARY BALLOON ANGIOPLASTY N/A 05/22/2018   Procedure: CORONARY BALLOON ANGIOPLASTY;  Surgeon: Wellington Hampshire, MD;  Location: [facility]  INVASIVE CV LAB;  Service: Cardiovascular;  Laterality: N/A;  . CORONARY STENT INTERVENTION N/A 12/26/2017   Procedure: CORONARY STENT INTERVENTION;  Surgeon: Wellington Hampshire, MD;  Location: [place] CV LAB;  Service: Cardiovascular;  Laterality: N/A;  . CORONARY STENT INTERVENTION N/A 02/08/2018   Procedure: CORONARY STENT INTERVENTION;  Surgeon: Nelva Bush,  MD;  Location: [place] CV LAB;  Service: Cardiovascular;  Laterality: N/A;  . CORONARY/GRAFT ACUTE MI REVASCULARIZATION N/A 12/19/2017   Procedure: Coronary/Graft Acute MI Revascularization;  Surgeon: Wellington Hampshire, MD;  Location: [place] CV LAB;  Service: Cardiovascular;  Laterality: N/A;  . ECTOPIC PREGNANCY SURGERY Left   . INTRAVASCULAR ULTRASOUND/IVUS N/A 02/08/2018   Procedure: Intravascular Ultrasound/IVUS;  Surgeon: Nelva Bush, MD;  Location: [place] CV LAB;  Service: Cardiovascular;  Laterality: N/A;  . LEFT HEART CATH AND CORONARY ANGIOGRAPHY N/A 12/19/2017   Procedure: LEFT HEART CATH AND CORONARY ANGIOGRAPHY;  Surgeon: Wellington Hampshire, MD;  Location: [place] CV LAB;  Service: Cardiovascular;  Laterality: N/A;  . LEFT HEART CATH AND CORONARY ANGIOGRAPHY N/A 12/26/2017   Procedure: LEFT HEART CATH AND CORONARY ANGIOGRAPHY;  Surgeon: Wellington Hampshire, MD;  Location: [place] CV LAB;  Service: Cardiovascular;  Laterality: N/A;  . LEFT HEART CATH AND CORONARY ANGIOGRAPHY N/A 02/08/2018   Procedure: LEFT HEART CATH AND CORONARY ANGIOGRAPHY;  Surgeon: Nelva Bush, MD;  Location: [place] CV LAB;  Service: Cardiovascular;  Laterality: N/A;  . LEFT HEART CATH AND CORONARY ANGIOGRAPHY N/A 05/22/2018   Procedure: LEFT HEART CATH AND CORONARY ANGIOGRAPHY;  Surgeon: Wellington Hampshire, MD;  Location: [place] CV LAB;  Service: Cardiovascular;  Laterality: N/A;  . LEFT HEART CATH AND CORONARY ANGIOGRAPHY N/A 06/15/2018   Procedure: LEFT HEART CATH AND CORONARY ANGIOGRAPHY;  Surgeon: Wellington Hampshire, MD;  Location: [place] CV LAB;  Service: Cardiovascular;  Laterality: N/A;  . LYMPHADENECTOMY    . SPLENECTOMY, PARTIAL    . vulvulasectomy       Prior to Admission medications   Medication Sig Start Date End Date Taking? Authorizing Provider  acetaminophen (TYLENOL) 325 MG tablet Take 650 mg by mouth every 6 (six) hours as needed for mild pain.      [provider]  albuterol (PROAIR HFA) 108 (90 Base) MCG/ACT inhaler Inhale 1 puff into the lungs every 6 (six) hours as needed for wheezing or shortness of breath.     [provider]  aspirin 81 MG tablet Take 81 mg by mouth daily.    [provider]  atorvastatin (LIPITOR) 80 MG tablet Take 1 tablet (80 mg total) by mouth daily at 6 PM. 01/10/18 03/25/19  Theora Gianotti, NP  citalopram (CELEXA) 20 MG tablet Take 1 tablet (20 mg total) by mouth daily. 03/29/18   Karamalegos, Devonne Doughty, DO  cyclobenzaprine (FLEXERIL) 5 MG tablet Take 1 tablet (5 mg total) by mouth 3 (three) times daily as needed for muscle spasms. 06/16/18   Nicholes Mango, MD  dicyclomine (BENTYL) 10 MG capsule Take 1 capsule (10 mg total) by mouth 3 (three) times daily as needed for up to 14 days for spasms. 08/28/18 09/11/18  Rudene Re, MD  esomeprazole (NEXIUM) 40 MG capsule Take 40 mg by mouth daily.     [provider]  famotidine (PEPCID) 20 MG tablet Take 1 tablet (20 mg total) by mouth 2 (two) times daily. 08/28/18 08/28/19  Rudene Re, MD  insulin aspart (NOVOLOG) 100 UNIT/ML injection Inject 25 Units into  the skin 3 (three) times daily before meals. Take 10 units PLUS sliding scale Patient taking differently: Inject 35 Units into the skin 3 (three) times daily before meals. Take 10 units PLUS sliding scale  05/29/18 08/27/18  Bettey Costa, MD  insulin glargine (LANTUS) 100 UNIT/ML injection Inject 0.55 mLs (55 Units total) into the skin 2 (two) times daily. Patient taking differently: Inject 60 Units into the skin 2 (two) times daily.  06/16/18 08/27/18  Nicholes Mango, MD  isosorbide mononitrate (IMDUR) 30 MG 24 hr tablet Take 1 tablet (30 mg total) by mouth daily. 07/13/18 10/11/18  Rise Mu, PA-C  LORazepam (ATIVAN) 1 MG tablet Take 1 tablet by mouth twice daily as needed for anxiety 08/28/18   Olin Hauser, DO  mirtazapine (REMERON) 15 MG tablet Take 15 mg  by mouth at bedtime. 07/07/18   [provider]  nitroGLYCERIN (NITROSTAT) 0.4 MG SL tablet Place 1 tablet (0.4 mg total) under the tongue every 5 (five) minutes as needed for chest pain. 06/16/18   Gouru, Illene Silver, MD  ondansetron (ZOFRAN ODT) 4 MG disintegrating tablet Take 1 tablet (4 mg total) by mouth every 8 (eight) hours as needed. 08/28/18   Rudene Re, MD  oxyCODONE (OXY IR/ROXICODONE) 5 MG immediate release tablet Take 1 tablet (5 mg total) by mouth every 6 (six) hours as needed for severe pain. 08/02/18   Verlon Au, NP  promethazine (PHENERGAN) 25 MG tablet Take 25 mg by mouth every 6 (six) hours as needed for nausea. for nausea 05/15/18   [provider]  ranolazine (RANEXA) 1000 MG SR tablet Take 1 tablet (1,000 mg total) by mouth 2 (two) times daily. 06/16/18   Nicholes Mango, MD  ticagrelor (BRILINTA) 90 MG TABS tablet Take 90 mg by mouth 2 (two) times daily.    [provider]     Allergies Bee venom; Metformin and related; Darvon [propoxyphene]; Gabapentin; Nsaids; Tramadol; and Contrast media [iodinated diagnostic agents]   Family History  Problem Relation Age of Onset  . Non-Hodgkin's lymphoma Mother   . Osteoporosis Mother   . Lupus Sister   . Heart disease Brother   . Heart attack Brother   . Heart attack Father     Social History Social History   Tobacco Use  . Smoking status: Former Smoker    Packs/day: 0.50    Years: 30.00    Pack years: 15.00    Types: Cigarettes    Last attempt to quit: 12/19/2017    Years since quitting: 0.6  . Smokeless tobacco: Never Used  . Tobacco comment: quit 12/19/2017.  Substance Use Topics  . Alcohol use: No  . Drug use: No    Review of Systems  Constitutional:   No fever or chills.  ENT:   No sore throat. No rhinorrhea. Cardiovascular:   No chest pain or syncope. Respiratory:   No dyspnea or cough. Gastrointestinal:   Positive for epigastric pain and vomiting.  No diarrhea or  constipation Musculoskeletal:   Negative for focal pain or swelling All other systems reviewed and are negative except as documented above in ROS and HPI.  ____________________________________________   PHYSICAL EXAM:  VITAL SIGNS: ED Triage Vitals  Enc Vitals Group     BP 08/29/18 0329 111/66     Pulse Rate 08/29/18 0329 92     Resp 08/29/18 0329 20     Temp 08/29/18 0329 99.2 F (37.3 C)     Temp Source 08/29/18 0329 Oral  SpO2 08/29/18 0329 96 %     Weight 08/29/18 0330 138 lb (62.6 kg)     Height 08/29/18 0330 5\' 1"  (1.549 m)     Head Circumference --      Peak Flow --      Pain Score 08/29/18 0330 9     Pain Loc --      Pain Edu? --      Excl. in [place]? --     Vital signs reviewed, nursing assessments reviewed.   Constitutional:   Alert and oriented. Non-toxic appearance. Eyes:   Conjunctivae are normal. EOMI. PERRL. ENT      Head:   Normocephalic and atraumatic.      Nose:   No congestion/rhinnorhea.       Mouth/Throat:   Dry mucous membranes, no pharyngeal erythema. No peritonsillar mass.       Neck:   No meningismus. Full ROM. Hematological/Lymphatic/Immunilogical:   No cervical lymphadenopathy. Cardiovascular:   RRR. Symmetric bilateral radial and DP pulses.  No murmurs. Cap refill less than 2 seconds. Respiratory:   Normal respiratory effort without tachypnea/retractions. Breath sounds are clear and equal bilaterally. No wheezes/rales/rhonchi. Gastrointestinal:   Soft and nontender. Non distended. There is no CVA tenderness.  No rebound, rigidity, or guarding.  Rectal exam performed with nurse Delsa Sale at bedside, brown stool Hemoccult negative  Musculoskeletal:   Normal range of motion in all extremities. No joint effusions.  No lower extremity tenderness.  No edema. Neurologic:   Normal speech and language.  Motor grossly intact. No acute focal neurologic deficits are appreciated.  Skin:    Skin is warm, dry and intact. No rash noted.  No petechiae, purpura, or  bullae.  ____________________________________________    LABS (pertinent positives/negatives) (all labs ordered are listed, but only abnormal results are displayed) Labs Reviewed  CBC - Abnormal; Notable for the following components:      Result Value   Hemoglobin 15.3 (*)    All other components within normal limits  COMPREHENSIVE METABOLIC PANEL - Abnormal; Notable for the following components:   Sodium 132 (*)    CO2 20 (*)    Glucose, Bld 385 (*)    Calcium 8.8 (*)    AST 11 (*)    Total Bilirubin 1.3 (*)    All other components within normal limits  URINALYSIS, COMPLETE (UACMP) WITH MICROSCOPIC - Abnormal; Notable for the following components:   Color, Urine STRAW (*)    APPearance CLEAR (*)    Glucose, UA >=500 (*)    Hgb urine dipstick SMALL (*)    Ketones, ur 20 (*)    Leukocytes,Ua TRACE (*)    All other components within normal limits  LIPASE, BLOOD  CBG MONITORING, ED   ____________________________________________   EKG    ____________________________________________    RADIOLOGY  No results found.  ____________________________________________   PROCEDURES Procedures  ____________________________________________  DIFFERENTIAL DIAGNOSIS   Pancreatitis, gastritis, GERD, hyperglycemia and dehydration, gastroparesis.  CLINICAL IMPRESSION / ASSESSMENT AND PLAN / ED COURSE  Medications ordered in the ED: Medications  sodium chloride 0.9 % bolus 1,000 mL (1,000 mLs Intravenous New Bag/Given 08/29/18 0550)  insulin aspart (novoLOG) injection 10 Units (10 Units Intravenous Given 08/29/18 0551)  metoCLOPramide (REGLAN) injection 10 mg (10 mg Intravenous Given 08/29/18 0551)  diphenhydrAMINE (BENADRYL) injection 25 mg (25 mg Intravenous Given 08/29/18 0551)    Pertinent labs & imaging results that were available during my care of the patient were reviewed by me and considered in  my medical decision making (see chart for details).    Patient presents  with upper abdominal pain and vomiting.  Doubt biliary disease or pancreatitis.  Vital signs and labs are unremarkable.  No evidence of GI bleed on exam, hemoglobin stable compared to yesterday and relatively hemoconcentrated.  She is mildly hyperglycemic with ketones in the urine.  Overall not acidotic, not consistent with DKA but would benefit from IV fluids and extra bolus dose aspart insulin.  Also give Reglan and Benadryl for symptom relief, reassess afterward.  If feeling better tolerating oral intake and glucose controlled, would be suitable for outpatient follow-up.      ____________________________________________   FINAL CLINICAL IMPRESSION(S) / ED DIAGNOSES    Final diagnoses:  Type 2 diabetes mellitus with hyperglycemia, unspecified whether long term insulin use (HCC)  Epigastric pain     ED Discharge Orders    None      Portions of this note were generated with dragon dictation software. Dictation errors may occur despite best attempts at proofreading.   Carrie Mew, MD 08/29/18 [phone]

## 2018-08-29 NOTE — ED Notes (Signed)
Upon going in room to turn off IV pump, room found empty with no signs of patient returning.  PIV removed and fluids running into floor.  EDP notified.

## 2018-08-29 NOTE — ED Notes (Signed)
Pt ambulatory to toilet independently. 

## 2018-08-29 NOTE — Telephone Encounter (Signed)
Patient advised.

## 2018-08-29 NOTE — ED Triage Notes (Signed)
Pt states she has been vomiting since 2300 states multiple times, states has noted blood in her vomit, hx of ulcers and pancreatitis. Pt also co RUQ pain, does have some urinary burning, no diarrhea.

## 2018-08-29 NOTE — ED Notes (Signed)
Secretary attempted to reach patient via phone; no answer at this time.

## 2018-08-29 NOTE — ED Provider Notes (Addendum)
Patient with intractable pain and vomiting.  I will discuss with the hospitalist for admission.   Earleen Newport, MD 08/29/18 [phone]  After the above dictation, per patient abruptly took her IV out and left without telling anyone.  I had already discussed with the hospitalist for admission.   Earleen Newport, MD 08/29/18 [phone]

## 2018-09-01 ENCOUNTER — Other Ambulatory Visit: Payer: Self-pay | Admitting: Cardiovascular Disease

## 2018-09-04 ENCOUNTER — Other Ambulatory Visit: Payer: Self-pay

## 2018-09-04 ENCOUNTER — Ambulatory Visit (INDEPENDENT_AMBULATORY_CARE_PROVIDER_SITE_OTHER): Payer: Medicaid Other | Admitting: Family Medicine

## 2018-09-04 ENCOUNTER — Encounter: Payer: Self-pay | Admitting: Family Medicine

## 2018-09-04 VITALS — BP 106/69 | HR 75 | Temp 98.3°F | Resp 16 | Ht 61.0 in | Wt 142.0 lb

## 2018-09-04 DIAGNOSIS — J432 Centrilobular emphysema: Secondary | ICD-10-CM

## 2018-09-04 DIAGNOSIS — D071 Carcinoma in situ of vulva: Secondary | ICD-10-CM | POA: Diagnosis not present

## 2018-09-04 DIAGNOSIS — Z515 Encounter for palliative care: Secondary | ICD-10-CM | POA: Diagnosis not present

## 2018-09-04 DIAGNOSIS — E1140 Type 2 diabetes mellitus with diabetic neuropathy, unspecified: Secondary | ICD-10-CM | POA: Diagnosis not present

## 2018-09-04 DIAGNOSIS — IMO0002 Reserved for concepts with insufficient information to code with codable children: Secondary | ICD-10-CM

## 2018-09-04 DIAGNOSIS — E43 Unspecified severe protein-calorie malnutrition: Secondary | ICD-10-CM

## 2018-09-04 DIAGNOSIS — E1165 Type 2 diabetes mellitus with hyperglycemia: Secondary | ICD-10-CM

## 2018-09-04 DIAGNOSIS — F418 Other specified anxiety disorders: Secondary | ICD-10-CM | POA: Diagnosis not present

## 2018-09-04 MED ORDER — CITALOPRAM HYDROBROMIDE 40 MG PO TABS: 40.0000 mg | ORAL_TABLET | Freq: Every day | ORAL | 1 refills | Status: DC

## 2018-09-04 NOTE — Patient Instructions (Addendum)
Thank you for coming to the office today.  Increased Citalopram (Celexa) from 20mg  daily up to 40mg  daily - new rx sent to pharmacy, to help mood and anxiety better. If not improved by 2-4 weeks, can call if you want to change medicine over to Lexapro instead, we can phone this in.  If significant worsening mood or anxiety, can return to office or to Dr Jacqualine Code  Also - please discuss the Ativan with Dr Joylene Draft ([facility]) to figure out if this is appropriate or if they have suggestions, we can rx in future if need or they can control this one for you as well.  Please schedule a Follow-up Appointment to: Return in about 3 months (around 12/05/2018) for 3 month follow-up for updates from [facility], Anxiety/Mood PHQGAD.  If you have any other questions or concerns, please feel free to call the office or send a message through [place]. You may also schedule an earlier appointment if necessary.  Additionally, you may be receiving a survey about your experience at our office within a few days to 1 week by e-mail or mail. We value your feedback.  Nobie Putnam, DO [place]

## 2018-09-04 NOTE — Progress Notes (Signed)
Subjective:    Patient ID: Renee Mack, female    DOB: 12/04/1965, 53 y.o.   MRN: [id number]  Renee Mack is a 53 y.o. female presenting on 09/04/2018 for Anxiety   HPI   Vulvar Cancer Stage 3 / Chronic Pain / [facility] unable to proceed with vulvar cancer treatment, will be referred to [facility], for palliative radiation - Palliative Care through [facility] for pain management, was also referred to [facility] locally. Also of note see below she was on Ativan regularly per psych in past, now they did not continue this, she has been bridged on this - She is scheduled to see both [facility] and Palliative Care on 3/23  Diabetes, improving Followed by [facility], last visit 07/26/18, she was increased from Lantus 60u twice a day 70 twice a day. Novolog 35u with meals plus sliding scale, with improving A1c slightly  Centrilobular Emphysema Quit smoking, has resolved symptoms. No inhalers. No flare up recently  Anxiety, with depression Chronic problem, worse due to medical conditions. Followed by [facility] Dr Jacqualine Code Psychiatry Currently on Celexa (Citalopram) 20mg  daily, Remeron 15mg  nightly, and Ativan 1mg   - She is taking Ativan 1mg  BID every day, without known history of withdrawal   Depression screen [facility] 2/9 09/04/2018 05/31/2018 02/27/2018  Decreased Interest 2 1 1   Down, Depressed, Hopeless 2 1 1   PHQ - 2 Score 4 2 2   Altered sleeping 3 2 3   Tired, decreased energy 3 1 3   Change in appetite 2 2 1   Feeling bad or failure about yourself  0 2 0  Trouble concentrating 2 2 -  Moving slowly or fidgety/restless 0 1 1  Suicidal thoughts 0 0 1  PHQ-9 Score 14 12 11   Difficult doing work/chores Not difficult at all Not difficult at all Somewhat difficult   GAD 7 : Generalized Anxiety Score 09/04/2018 05/31/2018 02/27/2018  Nervous, Anxious, on Edge 3 2 2   Control/stop worrying 3 2 2   Worry too much - different things 3 2 3   Trouble relaxing  3 2 3   Restless 3 1 2   Easily annoyed or irritable 2 1 2   Afraid - awful might happen 2 1 3   Total GAD 7 Score 19 11 17   Anxiety Difficulty Not difficult at all Not difficult at all Somewhat difficult    Social History   Tobacco Use  . Smoking status: Former Smoker    Packs/day: 0.50    Years: 30.00    Pack years: 15.00    Types: Cigarettes    Last attempt to quit: 12/19/2017    Years since quitting: 0.7  . Smokeless tobacco: Never Used  . Tobacco comment: quit 12/19/2017.  Substance Use Topics  . Alcohol use: No  . Drug use: No    Review of Systems Per HPI unless specifically indicated above     Objective:    BP 106/69   Pulse 75   Temp 98.3 F (36.8 C) (Oral)   Resp 16   Ht 5\' 1"  (1.549 m)   Wt 142 lb (64.4 kg)   BMI 26.83 kg/m   Wt Readings from Last 3 Encounters:  09/04/18 142 lb (64.4 kg)  08/29/18 138 lb (62.6 kg)  08/27/18 138 lb (62.6 kg)    Physical Exam Vitals signs and nursing note reviewed.  Constitutional:      General: She is not in acute distress.    Appearance: She is well-developed. She is not diaphoretic.  Comments: Well-appearing, comfortable, cooperative  HENT:     Head: Normocephalic and atraumatic.  Eyes:     General:        Right eye: No discharge.        Left eye: No discharge.     Conjunctiva/sclera: Conjunctivae normal.  Cardiovascular:     Rate and Rhythm: Normal rate.  Pulmonary:     Effort: Pulmonary effort is normal.  Skin:    General: Skin is warm and dry.     Findings: No erythema or rash.  Neurological:     Mental Status: She is alert and oriented to person, place, and time.  Psychiatric:        Behavior: Behavior normal.     Comments: Well groomed, good eye contact, normal speech and thoughts. Mood is intact, not anxious appearing today.    Results for orders placed or performed during the hospital encounter of 08/29/18  CBC  Result Value Ref Range   WBC 10.4 4.0 - 10.5 K/uL   RBC 4.82 3.87 - 5.11 MIL/uL    Hemoglobin 15.3 (H) 12.0 - 15.0 g/dL   HCT 45.8 36.0 - 46.0 %   MCV 95.0 80.0 - 100.0 fL   MCH 31.7 26.0 - 34.0 pg   MCHC 33.4 30.0 - 36.0 g/dL   RDW 13.1 11.5 - 15.5 %   Platelets 233 150 - 400 K/uL   nRBC 0.0 0.0 - 0.2 %  Comprehensive metabolic panel  Result Value Ref Range   Sodium 132 (L) 135 - 145 mmol/L   Potassium 4.4 3.5 - 5.1 mmol/L   Chloride 100 98 - 111 mmol/L   CO2 20 (L) 22 - 32 mmol/L   Glucose, Bld 385 (H) 70 - 99 mg/dL   BUN 18 6 - 20 mg/dL   Creatinine, Ser 0.49 0.44 - 1.00 mg/dL   Calcium 8.8 (L) 8.9 - 10.3 mg/dL   Total Protein 7.5 6.5 - 8.1 g/dL   Albumin 3.7 3.5 - 5.0 g/dL   AST 11 (L) 15 - 41 U/L   ALT 13 0 - 44 U/L   Alkaline Phosphatase 75 38 - 126 U/L   Total Bilirubin 1.3 (H) 0.3 - 1.2 mg/dL   GFR calc non Af Amer >60 >60 mL/min   GFR calc Af Amer >60 >60 mL/min   Anion gap 12 5 - 15  Lipase, blood  Result Value Ref Range   Lipase 16 11 - 51 U/L  Urinalysis, Complete w Microscopic  Result Value Ref Range   Color, Urine STRAW (A) YELLOW   APPearance CLEAR (A) CLEAR   Specific Gravity, Urine 1.020 1.005 - 1.030   pH 5.0 5.0 - 8.0   Glucose, UA >=500 (A) NEGATIVE mg/dL   Hgb urine dipstick SMALL (A) NEGATIVE   Bilirubin Urine NEGATIVE NEGATIVE   Ketones, ur 20 (A) NEGATIVE mg/dL   Protein, ur NEGATIVE NEGATIVE mg/dL   Nitrite NEGATIVE NEGATIVE   Leukocytes,Ua TRACE (A) NEGATIVE   RBC / HPF 0-5 0 - 5 RBC/hpf   WBC, UA 0-5 0 - 5 WBC/hpf   Bacteria, UA NONE SEEN NONE SEEN   Squamous Epithelial / LPF 0-5 0 - 5  Glucose, capillary  Result Value Ref Range   Glucose-Capillary 283 (H) 70 - 99 mg/dL      Assessment & Plan:   Problem List Items Addressed This Visit    Anxiety associated with depression Clinically mixed anxiety and depression, comorbid Followed by Psychiatry Dr Jacqualine Code Limited benefit on  Celexa 20mg  daily On BDZ chronic, with improvement now with complex comorbid health conditions  Plan - Increase Celexa from 20 up to 40mg   daily, new rx sent - Continue BDZ ativan as is for now - advised her to discuss with palliative at [facility] to determine if any recommendations or other med adjustment needed, perhaps they would manage her BDZ if transferring pain management as well    Relevant Medications   citalopram (CELEXA) 40 MG tablet   Centrilobular emphysema ([place]) Stable without acute flare or exacerbation Not on maintenance inhaler Follow-up PRN    Palliative care patient  Vulvar intraepithelial neoplasia (VIN) grade 3 - Primary   Establishing with [facility], palliative radiation    Protein-calorie malnutrition, severe Secondary to malignancy, nutrition status    Uncontrolled type 2 diabetes with neuropathy ([place]) Remains uncontrolled but gradual improvement On insulin Followed by [facility]    Vulvar intraepithelial neoplasia (VIN) grade 3 - Primary      Meds ordered this encounter  Medications  . citalopram (CELEXA) 40 MG tablet    Sig: Take 1 tablet (40 mg total) by mouth daily.    Dispense:  90 tablet    Refill:  1    Dose increase from 20 up to 40    Follow up plan: Return in about 3 months (around 12/05/2018) for 3 month follow-up for updates from [facility], Anxiety/Mood PHQGAD.  Nobie Putnam, [facility] 09/04/2018, 2:04 PM

## 2018-09-29 ENCOUNTER — Telehealth: Payer: Self-pay

## 2018-09-29 NOTE — Telephone Encounter (Signed)
Renee Mack was scheduled to follow up with Dr. Theora Gianotti 10-04-18 (3 month f/u). At the time this appointment was arranged she had not yet been seen by [facility]. Her radiation has now been started and is set to finish 10-23-18. Dr. Theora Gianotti would like to see her after radiation is completed. Appointment rescheduled to 10-21-18. Voicemail left with Renee Mack regarding the appointment changes.

## 2018-10-04 ENCOUNTER — Ambulatory Visit: Payer: Medicaid Other

## 2018-10-12 ENCOUNTER — Telehealth: Payer: Self-pay | Admitting: Cardiovascular Disease

## 2018-10-12 NOTE — Telephone Encounter (Signed)
Virtual Visit Pre-Appointment Phone Call  "(Name), I am calling you today to discuss your upcoming appointment. We are currently trying to limit exposure to the virus that causes COVID-19 by seeing patients at home rather than in the office."  1. "What is the BEST phone number to call the day of the visit?" - include this in appointment notes  2. "Do you have or have access to (through a family member/friend) a smartphone with video capability that we can use for your visit?" a. If yes - list this number in appt notes as "cell" (if different from BEST phone #) and list the appointment type as a VIDEO visit in appointment notes b. If no - list the appointment type as a PHONE visit in appointment notes  3. Confirm consent - "In the setting of the current Covid19 crisis, you are scheduled for a (phone or video) visit with your provider on (date) at (time).  Just as we do with many in-office visits, in order for you to participate in this visit, we must obtain consent.  If you'd like, I can send this to your mychart (if signed up) or email for you to review.  Otherwise, I can obtain your verbal consent now.  All virtual visits are billed to your insurance company just like a normal visit would be.  By agreeing to a virtual visit, we'd like you to understand that the technology does not allow for your provider to perform an examination, and thus may limit your provider's ability to fully assess your condition. If your provider identifies any concerns that need to be evaluated in person, we will make arrangements to do so.  Finally, though the technology is pretty good, we cannot assure that it will always work on either your or our end, and in the setting of a video visit, we may have to convert it to a phone-only visit.  In either situation, we cannot ensure that we have a secure connection.  Are you willing to proceed?" STAFF: Did the patient verbally acknowledge consent to telehealth visit? Document  YES/NO here: YES  4. Advise patient to be prepared - "Two hours prior to your appointment, go ahead and check your blood pressure, pulse, oxygen saturation, and your weight (if you have the equipment to check those) and write them all down. When your visit starts, your provider will ask you for this information. If you have an Apple Watch or Kardia device, please plan to have heart rate information ready on the day of your appointment. Please have a pen and paper handy nearby the day of the visit as well."  5. Give patient instructions for MyChart download to smartphone OR Doximity/Doxy.me as below if video visit (depending on what platform provider is using)  6. Inform patient they will receive a phone call 15 minutes prior to their appointment time (may be from unknown caller ID) so they should be prepared to answer    Renee Mack has been deemed a candidate for a follow-up tele-health visit to limit community exposure during the Covid-19 pandemic. I spoke with the patient via phone to ensure availability of phone/video source, confirm preferred email & phone number, and discuss instructions and expectations.  I reminded Renee Mack to be prepared with any vital sign and/or heart rhythm information that could potentially be obtained via home monitoring, at the time of her visit. I reminded Renee Mack to expect a phone call prior to her visit.  Renee Mack 10/12/2018 8:54 AM   INSTRUCTIONS FOR DOWNLOADING THE MYCHART APP TO SMARTPHONE  - The patient must first make sure to have activated MyChart and know their login information - If Apple, go to [employer] and type in MyChart in the search bar and download the app. If Android, ask patient to go to [employer] and type in [place] in the search bar and download the app. The app is free but as with any other app downloads, their phone may require them to verify saved payment information or Apple/Android  password.  - The patient will need to then log into the app with their MyChart username and password, and select [facility] as their healthcare provider to link the account. When it is time for your visit, go to the MyChart app, find appointments, and click Begin Video Visit. Be sure to Select Allow for your device to access the Microphone and Camera for your visit. You will then be connected, and your provider will be with you shortly.  **If they have any issues connecting, or need assistance please contact MyChart service desk [phone])**  **If using a computer, in order to ensure the best quality for their visit they will need to use either of the following Internet Browsers: Microsoft Edge, or Google ChromE  IF USING DOXIMITY or DOXY.ME - The patient will receive a link just prior to their visit by text.     FULL LENGTH CONSENT FOR TELE-HEALTH VISIT   I hereby voluntarily request, consent and authorize [facility] and its employed or contracted physicians, physician assistants, nurse practitioners or other licensed health care professionals (the Practitioner), to provide me with telemedicine health care services (the "Services") as deemed necessary by the treating Practitioner. I acknowledge and consent to receive the Services by the Practitioner via telemedicine. I understand that the telemedicine visit will involve communicating with the Practitioner through live audiovisual communication technology and the disclosure of certain medical information by electronic transmission. I acknowledge that I have been given the opportunity to request an in-person assessment or other available alternative prior to the telemedicine visit and am voluntarily participating in the telemedicine visit.  I understand that I have the right to withhold or withdraw my consent to the use of telemedicine in the course of my care at any time, without affecting my right to future care or treatment,  and that the Practitioner or I may terminate the telemedicine visit at any time. I understand that I have the right to inspect all information obtained and/or recorded in the course of the telemedicine visit and may receive copies of available information for a reasonable fee.  I understand that some of the potential risks of receiving the Services via telemedicine include:  Marland Kitchen Delay or interruption in medical evaluation due to technological equipment failure or disruption; . Information transmitted may not be sufficient (e.g. poor resolution of images) to allow for appropriate medical decision making by the Practitioner; and/or  . In rare instances, security protocols could fail, causing a breach of personal health information.  Furthermore, I acknowledge that it is my responsibility to provide information about my medical history, conditions and care that is complete and accurate to the best of my ability. I acknowledge that Practitioner's advice, recommendations, and/or decision may be based on factors not within their control, such as incomplete or inaccurate data provided by me or distortions of diagnostic images or specimens that may result from electronic transmissions. I understand that the practice  of medicine is not an Chief Strategy Officer and that Practitioner makes no warranties or guarantees regarding treatment outcomes. I acknowledge that I will receive a copy of this consent concurrently upon execution via email to the email address I last provided but may also request a printed copy by calling the office of [facility].    I understand that my insurance will be billed for this visit.   I have read or had this consent read to me. . I understand the contents of this consent, which adequately explains the benefits and risks of the Services being provided via telemedicine.  . I have been provided ample opportunity to ask questions regarding this consent and the Services and have had my questions  answered to my satisfaction. . I give my informed consent for the services to be provided through the use of telemedicine in my medical care  By participating in this telemedicine visit I agree to the above.

## 2018-10-13 ENCOUNTER — Ambulatory Visit: Payer: Medicaid Other | Admitting: Cardiovascular Disease

## 2018-10-27 ENCOUNTER — Other Ambulatory Visit: Payer: Self-pay

## 2018-10-27 ENCOUNTER — Encounter: Payer: Self-pay | Admitting: Cardiovascular Disease

## 2018-10-27 ENCOUNTER — Telehealth (INDEPENDENT_AMBULATORY_CARE_PROVIDER_SITE_OTHER): Payer: Medicaid Other | Admitting: Cardiovascular Disease

## 2018-10-27 VITALS — BP 106/69 | HR 75 | Ht 61.0 in | Wt 140.0 lb

## 2018-10-27 DIAGNOSIS — I25118 Atherosclerotic heart disease of native coronary artery with other forms of angina pectoris: Secondary | ICD-10-CM | POA: Diagnosis not present

## 2018-10-27 DIAGNOSIS — E785 Hyperlipidemia, unspecified: Secondary | ICD-10-CM

## 2018-10-27 MED ORDER — RANOLAZINE ER 1000 MG PO TB12: 1000.0000 mg | ORAL_TABLET | Freq: Two times a day (BID) | ORAL | 3 refills | Status: DC

## 2018-10-27 NOTE — Patient Instructions (Signed)
Medication Instructions:  Continue same medications If you need a refill on your cardiac medications before your next appointment, please call your pharmacy.   Lab work: None If you have labs (blood work) drawn today and your tests are completely normal, you will receive your results only by: . MyChart Message (if you have MyChart) OR . A paper copy in the mail If you have any lab test that is abnormal or we need to change your treatment, we will call you to review the results.  Testing/Procedures: None  Follow-Up: At [facility], you and your health needs are our priority.  As part of our continuing mission to provide you with exceptional heart care, we have created designated Provider Care Teams.  These Care Teams include your primary Cardiologist (physician) and Advanced Practice Providers (APPs -  Physician Assistants and Nurse Practitioners) who all work together to provide you with the care you need, when you need it. You will need a follow up appointment in 3 months.  Please call our office 2 months in advance to schedule this appointment.  You may see Omare Bilotta, MD or one of the following Advanced Practice Providers on your designated Care Team:   Christopher Berge, NP Ryan Dunn, PA-C . Jacquelyn Visser, PA-C     

## 2018-10-27 NOTE — Progress Notes (Signed)
Virtual Visit via Telephone Note   This visit type was conducted due to national recommendations for restrictions regarding the COVID-19 Pandemic (e.g. social distancing) in an effort to limit this patient's exposure and mitigate transmission in our community.  Due to her co-morbid illnesses, this patient is at least at moderate risk for complications without adequate follow up.  This format is felt to be most appropriate for this patient at this time.  The patient did not have access to video technology/had technical difficulties with video requiring transitioning to audio format only (telephone).  All issues noted in this document were discussed and addressed.  No physical exam could be performed with this format.  Please refer to the patient's chart for her  consent to telehealth for [facility].   Date:  10/27/2018   ID:  Renee Mack, DOB April 29, 1966, MRN [id number]  Patient Location: Home Provider Location: Office  PCP:  Olin Hauser, DO  Cardiologist:  [name], MD  Electrophysiologist:  None   Evaluation Performed:  Follow-Up Visit  Chief Complaint: No chest pain.  History of Present Illness:    Renee Mack is a 53 y.o. female who was reached via phone for a follow-up visit. She has known history of coronary artery disease status post anterior myocardial infarction in July 2019 which required placement of 3 drug-eluting stents.  Since then, she had recurrent stent thrombosis requiring additional PCI most recently in December 2019.  No events since then.  She has known history of chronic systolic heart failure due to ischemic cardiomyopathy with an EF of 45 to 50%, previous tobacco use, COPD, hyperlipidemia, GERD, diabetes mellitus and previous pancreatitis. She also has vulvar cancer being treated at [facility] with radiation therapy.  She was told that she might require surgery. Fortunately, she has been stable from a cardiac standpoint with no recent chest pain or  shortness of breath.  She takes her medications regularly.  The patient does not have symptoms concerning for COVID-19 infection (fever, chills, cough, or new shortness of breath).    Past Medical History:  Diagnosis Date   Cancer ([place])    vulvular   Cervical disc disease    a. 01/2018 MRI Cervical spine: Cervical spondylosis with multilevel disc and facet degeneration greatest at C5-6 and C6-7.  Moderate to sev R C5-6, mod L C5-6, and mod bilat C6-7 foraminal stenosis with multilevel mild foraminal stenosis.  Mild C5-6 and mod C6-7 canal stenosis. C6-7 central cord impingement w/ cord flattening.   Chronic combined systolic (congestive) and diastolic (congestive) heart failure ([place])    a. 12/2017 Echo: EF 30-35%, mid-apicalanteroseptal, ant, and apical AK. Gr1 DD. Mild conc LVH; b. 03/2018 Echo: EF 45-50%, antsept, ant HK. Mild MR. Nl LA size. Nl RV fxn.   COPD (chronic obstructive pulmonary disease) (HCC)    not on home oxygen   Coronary artery disease    a. 12/2017 ACS/PCI: LAD 100p (2.25x26 Resolute Onyx DES), 38m (2.0x12 Resolute Onyx DES), 80d (2.0x15 Resolute Onyx DES), RCA 90p (non-dominant). EF 25-35%. Post-MI course complicated by CGS; b. 02/9241 NSTEMI/subacute thrombosis-->LAD 100 (PTCA + DES x 1); c. 01/2018 NSTEMI/PCI: LM min irregs, LAD 50-60p ISR/hazy (3.5x12 Resolute DES), 63m/d (underexpansion of prior stents-->PTCA). EF 45-50%.   Diabetes mellitus without complication (HCC)    GERD (gastroesophageal reflux disease)    H/O degenerative disc disease    Hypotension    Ischemic cardiomyopathy    a. 12/2017 Echo: EF 30-35%; b. 03/2018 Echo: EF 45-50%.  Myocardial infarction ([place])    a. 12/2017-->DES to LAD x 3.   Pancreatitis    Shingles    Spinal stenosis    Tobacco abuse    Ulcer (traumatic) of oral mucosa    Urinary incontinence    VIN III (vulvar intraepithelial neoplasia III)    Past Surgical History:  Procedure Laterality Date   ABDOMINAL  HYSTERECTOMY     partial   CORONARY BALLOON ANGIOPLASTY N/A 05/22/2018   Procedure: CORONARY BALLOON ANGIOPLASTY;  Surgeon: Wellington Hampshire, MD;  Location: [facility];  Service: Cardiovascular;  Laterality: N/A;   CORONARY STENT INTERVENTION N/A 12/26/2017   Procedure: CORONARY STENT INTERVENTION;  Surgeon: Wellington Hampshire, MD;  Location: [facility];  Service: Cardiovascular;  Laterality: N/A;   CORONARY STENT INTERVENTION N/A 02/08/2018   Procedure: CORONARY STENT INTERVENTION;  Surgeon: Nelva Bush, MD;  Location: [facility];  Service: Cardiovascular;  Laterality: N/A;   CORONARY/GRAFT ACUTE MI REVASCULARIZATION N/A 12/19/2017   Procedure: Coronary/Graft Acute MI Revascularization;  Surgeon: Wellington Hampshire, MD;  Location: [facility];  Service: Cardiovascular;  Laterality: N/A;   ECTOPIC PREGNANCY SURGERY Left    INTRAVASCULAR ULTRASOUND/IVUS N/A 02/08/2018   Procedure: Intravascular Ultrasound/IVUS;  Surgeon: Nelva Bush, MD;  Location: [facility];  Service: Cardiovascular;  Laterality: N/A;   LEFT HEART CATH AND CORONARY ANGIOGRAPHY N/A 12/19/2017   Procedure: LEFT HEART CATH AND CORONARY ANGIOGRAPHY;  Surgeon: Wellington Hampshire, MD;  Location: [facility];  Service: Cardiovascular;  Laterality: N/A;   LEFT HEART CATH AND CORONARY ANGIOGRAPHY N/A 12/26/2017   Procedure: LEFT HEART CATH AND CORONARY ANGIOGRAPHY;  Surgeon: Wellington Hampshire, MD;  Location: [facility];  Service: Cardiovascular;  Laterality: N/A;   LEFT HEART CATH AND CORONARY ANGIOGRAPHY N/A 02/08/2018   Procedure: LEFT HEART CATH AND CORONARY ANGIOGRAPHY;  Surgeon: Nelva Bush, MD;  Location: [facility];  Service: Cardiovascular;  Laterality: N/A;   LEFT HEART CATH AND CORONARY ANGIOGRAPHY N/A 05/22/2018   Procedure: LEFT HEART CATH AND CORONARY ANGIOGRAPHY;  Surgeon: Wellington Hampshire, MD;  Location: [facility];  Service:  Cardiovascular;  Laterality: N/A;   LEFT HEART CATH AND CORONARY ANGIOGRAPHY N/A 06/15/2018   Procedure: LEFT HEART CATH AND CORONARY ANGIOGRAPHY;  Surgeon: Wellington Hampshire, MD;  Location: [facility];  Service: Cardiovascular;  Laterality: N/A;   LYMPHADENECTOMY     SPLENECTOMY, PARTIAL     vulvulasectomy       Current Meds  Medication Sig   acetaminophen (TYLENOL) 325 MG tablet Take 650 mg by mouth every 6 (six) hours as needed for mild pain.    albuterol (PROAIR HFA) 108 (90 Base) MCG/ACT inhaler Inhale 1 puff into the lungs every 6 (six) hours as needed for wheezing or shortness of breath.    aspirin 81 MG tablet Take 81 mg by mouth daily.   atorvastatin (LIPITOR) 80 MG tablet Take 1 tablet (80 mg total) by mouth daily at 6 PM.   citalopram (CELEXA) 40 MG tablet Take 1 tablet (40 mg total) by mouth daily.   cyclobenzaprine (FLEXERIL) 5 MG tablet Take 1 tablet (5 mg total) by mouth 3 (three) times daily as needed for muscle spasms.   esomeprazole (NEXIUM) 40 MG capsule Take 40 mg by mouth daily.    insulin aspart (NOVOLOG) 100 UNIT/ML injection Inject 25 Units into the skin 3 (three) times daily before meals. Take 10 units  PLUS sliding scale (Patient taking differently: Inject 35 Units into the skin 3 (three) times daily before meals. Take 10 units PLUS sliding scale )   insulin glargine (LANTUS) 100 UNIT/ML injection Inject 0.55 mLs (55 Units total) into the skin 2 (two) times daily. (Patient taking differently: Inject 60 Units into the skin 2 (two) times daily. )   LORazepam (ATIVAN) 1 MG tablet Take 1 tablet by mouth twice daily as needed for anxiety   mirtazapine (REMERON) 15 MG tablet Take 15 mg by mouth at bedtime.   nitroGLYCERIN (NITROSTAT) 0.4 MG SL tablet Place 1 tablet (0.4 mg total) under the tongue every 5 (five) minutes as needed for chest pain.   ondansetron (ZOFRAN ODT) 4 MG disintegrating tablet Take 1 tablet (4 mg total) by mouth every 8 (eight)  hours as needed.   oxyCODONE (OXY IR/ROXICODONE) 5 MG immediate release tablet Take 1 tablet (5 mg total) by mouth every 6 (six) hours as needed for severe pain.   promethazine (PHENERGAN) 25 MG tablet Take 25 mg by mouth every 6 (six) hours as needed for nausea. for nausea   ranolazine (RANEXA) 1000 MG SR tablet Take 1 tablet (1,000 mg total) by mouth 2 (two) times daily.   ticagrelor (BRILINTA) 90 MG TABS tablet Take 90 mg by mouth 2 (two) times daily.     Allergies:   Bee venom; Metformin and related; Darvon [propoxyphene]; Gabapentin; Nsaids; Tramadol; and Contrast media [iodinated diagnostic agents]   Social History   Tobacco Use   Smoking status: Former Smoker    Packs/day: 0.50    Years: 30.00    Pack years: 15.00    Types: Cigarettes    Last attempt to quit: 12/19/2017    Years since quitting: 0.8   Smokeless tobacco: Never Used   Tobacco comment: quit 12/19/2017.  Substance Use Topics   Alcohol use: No   Drug use: No     Family Hx: The patient's family history includes Heart attack in her brother and father; Heart disease in her brother; Lupus in her sister; Non-Hodgkin's lymphoma in her mother; Osteoporosis in her mother.  ROS:   Please see the history of present illness.     All other systems reviewed and are negative.   Prior CV studies:   The following studies were reviewed today:    Labs/Other Tests and Data Reviewed:    EKG:  No ECG reviewed.  Recent Labs: 03/24/2018: B Natriuretic Peptide 91.0; Magnesium 1.7 05/22/2018: TSH 1.447 08/29/2018: ALT 13; BUN 18; Creatinine, Ser 0.49; Hemoglobin 15.3; Platelets 233; Potassium 4.4; Sodium 132   Recent Lipid Panel Lab Results  Component Value Date/Time   CHOL 197 07/27/2018 03:07 PM   TRIG 215 (H) 07/27/2018 03:07 PM   HDL 36 (L) 07/27/2018 03:07 PM   CHOLHDL 5.5 07/27/2018 03:07 PM   LDLCALC 118 (H) 07/27/2018 03:07 PM    Wt Readings from Last 3 Encounters:  10/27/18 140 lb (63.5 kg)  09/04/18  142 lb (64.4 kg)  08/29/18 138 lb (62.6 kg)     Objective:    Vital Signs:  BP 106/69    Pulse 75    Ht 5\' 1"  (1.549 m)    Wt 140 lb (63.5 kg)    BMI 26.45 kg/m    VITAL SIGNS:  reviewed  ASSESSMENT & PLAN:    1. Coronary artery disease involving native coronary arteries with stable angina: She is overall doing very well and much better than before.  No further cardiac  events since December 2019.  The plan is to keep her on dual antiplatelet therapy indefinitely.  However, if surgery for vulvar cancer is felt to be lifesaving, we have to balance risk and benefits and consider stopping Brilinta for 5 days before surgery which might be reasonable to do given that her most recent PCI was 6 months ago.  I refilled her medication. 2. Hyperlipidemia: Continue high-dose atorvastatin.  Most recent LDL was 118.  She will require a repeat lipid profile. 3. Chronic systolic heart failure: Most recent echo showed an EF of 45 to 50%.  The patient had severe hypotension that precluded heart failure medications.  COVID-19 Education: The signs and symptoms of COVID-19 were discussed with the patient and how to seek care for testing (follow up with PCP or arrange E-visit).  The importance of social distancing was discussed today.  Time:   Today, I have spent 15 minutes with the patient with telehealth technology discussing the above problems.     Medication Adjustments/Labs and Tests Ordered: Current medicines are reviewed at length with the patient today.  Concerns regarding medicines are outlined above.   Tests Ordered: No orders of the defined types were placed in this encounter.   Medication Changes: No orders of the defined types were placed in this encounter.   Disposition:  Follow up in 3 month(s)  Signed, [name], MD  10/27/2018 12:51 PM    [facility]

## 2018-11-08 ENCOUNTER — Ambulatory Visit: Payer: Medicaid Other

## 2018-11-22 ENCOUNTER — Inpatient Hospital Stay: Payer: Medicaid Other | Attending: Obstetrics and Gynecology | Admitting: Obstetrics and Gynecology

## 2018-11-22 ENCOUNTER — Telehealth: Payer: Self-pay | Admitting: Nurse Practitioner

## 2018-11-22 ENCOUNTER — Other Ambulatory Visit: Payer: Self-pay

## 2018-11-22 ENCOUNTER — Telehealth: Payer: Self-pay

## 2018-11-22 ENCOUNTER — Encounter: Payer: Self-pay | Admitting: Obstetrics and Gynecology

## 2018-11-22 VITALS — BP 134/74 | HR 74 | Temp 98.5°F | Resp 20 | Ht 61.0 in | Wt 135.4 lb

## 2018-11-22 DIAGNOSIS — G893 Neoplasm related pain (acute) (chronic): Secondary | ICD-10-CM | POA: Diagnosis not present

## 2018-11-22 DIAGNOSIS — E119 Type 2 diabetes mellitus without complications: Secondary | ICD-10-CM | POA: Diagnosis not present

## 2018-11-22 DIAGNOSIS — I255 Ischemic cardiomyopathy: Secondary | ICD-10-CM | POA: Diagnosis not present

## 2018-11-22 DIAGNOSIS — I2510 Atherosclerotic heart disease of native coronary artery without angina pectoris: Secondary | ICD-10-CM | POA: Insufficient documentation

## 2018-11-22 DIAGNOSIS — I5042 Chronic combined systolic (congestive) and diastolic (congestive) heart failure: Secondary | ICD-10-CM | POA: Diagnosis not present

## 2018-11-22 DIAGNOSIS — R87629 Unspecified abnormal cytological findings in specimens from vagina: Secondary | ICD-10-CM | POA: Insufficient documentation

## 2018-11-22 DIAGNOSIS — Z923 Personal history of irradiation: Secondary | ICD-10-CM | POA: Insufficient documentation

## 2018-11-22 DIAGNOSIS — Z8542 Personal history of malignant neoplasm of other parts of uterus: Secondary | ICD-10-CM | POA: Diagnosis not present

## 2018-11-22 DIAGNOSIS — K219 Gastro-esophageal reflux disease without esophagitis: Secondary | ICD-10-CM | POA: Insufficient documentation

## 2018-11-22 DIAGNOSIS — R300 Dysuria: Secondary | ICD-10-CM

## 2018-11-22 DIAGNOSIS — F410 Panic disorder [episodic paroxysmal anxiety] without agoraphobia: Secondary | ICD-10-CM | POA: Diagnosis not present

## 2018-11-22 DIAGNOSIS — Z87891 Personal history of nicotine dependence: Secondary | ICD-10-CM | POA: Insufficient documentation

## 2018-11-22 DIAGNOSIS — I252 Old myocardial infarction: Secondary | ICD-10-CM | POA: Diagnosis not present

## 2018-11-22 DIAGNOSIS — Z90710 Acquired absence of both cervix and uterus: Secondary | ICD-10-CM | POA: Diagnosis not present

## 2018-11-22 DIAGNOSIS — R102 Pelvic and perineal pain: Secondary | ICD-10-CM

## 2018-11-22 DIAGNOSIS — D071 Carcinoma in situ of vulva: Secondary | ICD-10-CM | POA: Diagnosis not present

## 2018-11-22 LAB — URINALYSIS, COMPLETE (UACMP) WITH MICROSCOPIC
Bacteria, UA: NONE SEEN
Bilirubin Urine: NEGATIVE
Glucose, UA: >=500 mg/dL — AB
Hgb urine dipstick: NEGATIVE
Ketones, ur: NEGATIVE mg/dL
Leukocytes,Ua: NEGATIVE
Nitrite: NEGATIVE
Protein, ur: NEGATIVE mg/dL
Specific Gravity, Urine: 1.013 (ref 1.005–1.030)
pH: 5 (ref 5.0–8.0)

## 2018-11-22 NOTE — Telephone Encounter (Signed)
U/A negative for infection.

## 2018-11-22 NOTE — Progress Notes (Addendum)
Gynecologic Oncology Interval Visit   Referring Provider: Dr. Parks Ranger  Chief Complaint: VIN 3  Subjective:  Renee Mack is a 53 y.o. female s/p TH for "uterine cancer" and h/o vulvar cancer, initially seen in consultation for Dr. Parks Ranger for possible recurrent vulvar cancer, who returns to clinic today for management of VIN 3 s/p radiation therapy.   Given degree of vulvar discomfort, extensive VIN3, poor surgical candidate she was referred to radiation oncology and received radiation with Dr. Christel Mormon which she completed 11/09/2018. She was referred to pain management for chronic pain and elected to be followed by palliative care, Dr. Joylene Draft at [facility].   She received radiation with Dr. Christel Mormon 09/26/2018- 11/09/2018. She is scheduled to see him again the beginning of July.   She has persistent fatigue, weakness, vaginal discharge, itching, spotting, and pain. She continues oxycontin 30 mg BID and oxycodone 10mg  q4h prn for breakthrough. She says that Dr. Fletcher Anon, Cardiology did permit her to come off of anticoagulation for 5 days if surgery was needed. She complains of a boil/abscess on her right bottom which she thinks may have burst this morning. She does have skin toxicity due to radiation and is using SSD cream BID, topical lidocaine, and [place] soaks. Her symptoms have improved slightly over the last week.    Gynecologic Oncology History: Renee Mack is a pleasant female s/p TH for "uterine cancer" and h/o vulvar cancer, initially seen in consultation for Dr. Parks Ranger for possible recurrent vulvar cancer, who presented for management of VIN 3. Her history is as follows.  ~ 1994  History of 'uterine cancer' s/p hysterectomy in [place] for patient reported 'dysplasia; they'd tried burning and cutting it out'- vaginal hysterectomy; Retained ovaries and 1 tube.  05/05/2004- stage I vulvar cancer 1.3 mm invasion treated with left radical vulvectomy and bilateral groin node dissection 2013-    pap benign HPV negative 04/2012- right vulvectomy for microinvasive vulvar cancer and been 3 by Dr. Maudie Mercury 10/04/2012  MOHS surgery right groin for [facility] 10/15/2014- vulvar colposcopy revealed no suspicious lesions; patient was well-appearing and transferred to ED where she was diagnosed with DKA and pancreatitis 04/07/2017- presented to ED with vulvar pain exam notable for cobblestone epithelium at left labia minora (biopsied), cobblestoned, irritated appearing mucosa to left of urethra and vagina.  Approximately 2 mm isolated vesicle on right labia.    Macerated erythematous skin at buttocks abutting anus appearing chronically moist.  White epithelium at 6:00 of the anus but perianal exam limited by lack of stirrups.  Vulvar biopsy showed HSIL at least VIN 2. 04/12/17-  seen for ER follow-up by Dr. [name]. Per note, at least recurrent disease amenable to resection.  Left upper aspect of vulva not amenable to outpatient laser.  May also have right-sided disease but patient declined vulvar biopsy. Chronic vulvar pain not reproducible. 11/13/2017-11/16/2017-admitted to hospital for vulvar cellulitis and underwent I&D, treated with broad-spectrum antibiotics and discharged on Bactrim. 11/24/2017-  ER visit for severe vulvovaginal candidiasis treated with oral fluconazole and topical miconazole with oxycodone for breakthrough pain.   Patient lost to follow-up.   She has history of type 2 diabetes mellitus with hemoglobin A1c of 11.9% on 03/24/18 down from 15.1% on 11/14/2017. Current smoker. CAD s/p NSTEMI 12/2017 stenting x 5, chronic combined systolic and diastolic heart failure, ischemic cardiomyopathy, GERD, pancreatitis, multiple episodes of DKA with recurrent UTI, depression, panic disorder, chronic pain syndrome. LVEF 45-50%.  She says that recent heart attack "scared her" and that she realized that  she needs to start taking better care of herself.  She has been advised that she needs additional surgery for  treatment of vulvar cancer but cannot have surgery until her blood sugars are under better control. She continues indefinite anticoagulation with aspirin and brilinta (antiplatelet). She has vulvar pain , worse with urination, urinary incontinence. She also complains of night sweats, headaches.  She was discharged from hospital yesterday, 05/23/18 after admission for hyperglycemia and unstable angina with underlying CAD. She underwent cardiac catheterization and percutaneous angioplasty of prior stent.  Per her report she had a Pap 6 months ago but in the computer her last Pap was 03/2012 ([facility]). She complains of vulvar pain.    Vaginal Pap on 05/24/18 - HSIL-epithelial cell abnormality/HSILVB.  Vulvar lesion on exam concerning for VIN versus vulvar cancer recurrence.  She returns today for biopsy.  She is currently on anticoagulation per tecagrelor and aspirin post NSTEMI and cardiac cath on 05/22/18.  Cardiology was consulted for recommendation and they advised not to hold antiplatelet medications due to her very high cardiac risk.  She was hospitalized on 05/28/2018-05/29/2018 for hyperglycemia and intractable nausea and vomiting.  She was discharged home.   On 06/07/18 she underwent a vulvar biopsy with Dr. Fransisca Connors.  DIAGNOSIS:  A. VULVA; BIOPSY:  - AT LEAST HIGH-GRADE SQUAMOUS INTRAEPITHELIAL LESION (VIN 3; SEVERE DYSPLASIA).  - DUE TO FRAGMENTATION AND TANGENTIAL TISSUE ORIENTATION, DEFINITIVE INVASION CANNOT BE RELIABLY ASSESSED.   Vaginal Pap on 05/24/18 - HSIL-epithelial cell abnormality/HSIL; negative for HRHPV tested.   Previous discussions with cardiology advised against stopping anti-platelet therapies for procedures or treatments. Given this, management options were discussed including radiation vs imiquimod. She expressed considerable pain involving the vulvar and perianal area. She is using Tylenol as well as lidocaine 2% jelly but has minimal relief.   She tried imiquimod but was  unable to tolerate the therapy due to vulvar pain/irritation.   Seen in clinic by Dr. Theora Gianotti on 07/05/2018. Perianal biopsy performed.   A. Perianal Lesion- left; biopsy:  - high grade squamous intraepithelial lesion (HSIL- classifiable as PAIN-3, warty type).  - candidiasis  Given that cardiology advised against stopping anti-platelet therapies, we discussed radiation vs imiquimod but advised that she was a poor candidate for topical therapies. She was referred to radiation and saw Dr. Baruch Gouty on 07/12/2018. Exam concerning for secondary infection and she was started on bactrim ds BID x 14 days and given topical econazole.   Both vulvar and perianal biopsies were sent to [facility] for pathology review.   A.  Perianal lesion, left, biopsy.  Outside consult, [id number], [facility], [place], [place]. Date of procedure 07-05-18: - High grade squamous intraepithelial lesion (HSIL)/anal intraepithelial neoplasia-3 (AIN/PAIN-3). By report, fungal forms consistent with Candida sp. were visualized on PASF staining.  She saw Dr. Baruch Gouty for follow-up on 07/31/2018. Exam again concerning for cellulitis but overall improved. Referred to [facility]. Christel Mormon at [facility] for radiation therapy given the complexity of her anatomical distribution.      Problem List: Patient Active Problem List   Diagnosis Date Noted  . Palliative care patient 09/04/2018  . Hyperglycemia 05/28/2018  . Vulvar intraepithelial neoplasia (VIN) grade 3 05/24/2018  . History of ST elevation myocardial infarction (STEMI) 02/27/2018  . CAD (coronary artery disease) 02/27/2018  . Hyperlipidemia associated with type 2 diabetes mellitus ([place]) 02/27/2018  . Anxiety associated with depression 02/27/2018  . Chronic systolic heart failure ([place])   . Chest pain 12/26/2017  . Unstable angina ([place])   .  Ischemic cardiomyopathy   . Former smoker   . Protein-calorie malnutrition, severe 07/18/2017  . Uncontrolled type 2 diabetes with  neuropathy ([place]) 12/28/2014  . Centrilobular emphysema ([place]) 12/28/2014  . GERD (gastroesophageal reflux disease) 12/28/2014  . History of shingles 12/28/2014  . Hyperlipidemia LDL goal <70 12/28/2014    Past Medical History: Past Medical History:  Diagnosis Date  . Cancer ([place])    vulvular  . Cervical disc disease    a. 01/2018 MRI Cervical spine: Cervical spondylosis with multilevel disc and facet degeneration greatest at C5-6 and C6-7.  Moderate to sev R C5-6, mod L C5-6, and mod bilat C6-7 foraminal stenosis with multilevel mild foraminal stenosis.  Mild C5-6 and mod C6-7 canal stenosis. C6-7 central cord impingement w/ cord flattening.  . Chronic combined systolic (congestive) and diastolic (congestive) heart failure ([place])    a. 12/2017 Echo: EF 30-35%, mid-apicalanteroseptal, ant, and apical AK. Gr1 DD. Mild conc LVH; b. 03/2018 Echo: EF 45-50%, antsept, ant HK. Mild MR. Nl LA size. Nl RV fxn.  Marland Kitchen COPD (chronic obstructive pulmonary disease) (HCC)    not on home oxygen  . Coronary artery disease    a. 12/2017 ACS/PCI: LAD 100p (2.25x26 Resolute Onyx DES), 15m (2.0x12 Resolute Onyx DES), 80d (2.0x15 Resolute Onyx DES), RCA 90p (non-dominant). EF 25-35%. Post-MI course complicated by CGS; b. 01/5276 NSTEMI/subacute thrombosis-->LAD 100 (PTCA + DES x 1); c. 01/2018 NSTEMI/PCI: LM min irregs, LAD 50-60p ISR/hazy (3.5x12 Resolute DES), 38m/d (underexpansion of prior stents-->PTCA). EF 45-50%.  . Diabetes mellitus without complication ([place])   . GERD (gastroesophageal reflux disease)   . H/O degenerative disc disease   . Hypotension   . Ischemic cardiomyopathy    a. 12/2017 Echo: EF 30-35%; b. 03/2018 Echo: EF 45-50%.  . Myocardial infarction ([place])    a. 12/2017-->DES to LAD x 3.  . Pancreatitis   . Shingles   . Spinal stenosis   . Tobacco abuse   . Ulcer (traumatic) of oral mucosa   . Urinary incontinence   . VIN III (vulvar intraepithelial neoplasia III)     Past Surgical History: Past  Surgical History:  Procedure Laterality Date  . ABDOMINAL HYSTERECTOMY     partial  . CORONARY BALLOON ANGIOPLASTY N/A 05/22/2018   Procedure: CORONARY BALLOON ANGIOPLASTY;  Surgeon: Wellington [place], MD;  Location: [place] [facility];  Service: Cardiovascular;  Laterality: N/A;  . CORONARY STENT INTERVENTION N/A 12/26/2017   Procedure: CORONARY STENT INTERVENTION;  Surgeon: Wellington [place], MD;  Location: [place] [facility];  Service: Cardiovascular;  Laterality: N/A;  . CORONARY STENT INTERVENTION N/A 02/08/2018   Procedure: CORONARY STENT INTERVENTION;  Surgeon: Nelva Bush, MD;  Location: [place] [facility];  Service: Cardiovascular;  Laterality: N/A;  . CORONARY/GRAFT ACUTE MI REVASCULARIZATION N/A 12/19/2017   Procedure: Coronary/Graft Acute MI Revascularization;  Surgeon: Wellington [place], MD;  Location: [place];  Service: Cardiovascular;  Laterality: N/A;  . ECTOPIC PREGNANCY SURGERY Left   . INTRAVASCULAR ULTRASOUND/IVUS N/A 02/08/2018   Procedure: Intravascular Ultrasound/IVUS;  Surgeon: Nelva Bush, MD;  Location: [place];  Service: Cardiovascular;  Laterality: N/A;  . LEFT HEART CATH AND CORONARY ANGIOGRAPHY N/A 12/19/2017   Procedure: LEFT HEART CATH AND CORONARY ANGIOGRAPHY;  Surgeon: Wellington [place], MD;  Location: [facility];  Service: Cardiovascular;  Laterality: N/A;  . LEFT HEART CATH AND CORONARY ANGIOGRAPHY N/A 12/26/2017   Procedure: LEFT HEART CATH AND CORONARY ANGIOGRAPHY;  Surgeon: Wellington [place],  MD;  Location: [place] [facility];  Service: Cardiovascular;  Laterality: N/A;  . LEFT HEART CATH AND CORONARY ANGIOGRAPHY N/A 02/08/2018   Procedure: LEFT HEART CATH AND CORONARY ANGIOGRAPHY;  Surgeon: Nelva Bush, MD;  Location: [facility] [facility];  Service: Cardiovascular;  Laterality: N/A;  . LEFT HEART CATH AND CORONARY ANGIOGRAPHY N/A 05/22/2018   Procedure: LEFT HEART CATH AND CORONARY ANGIOGRAPHY;  Surgeon:  Wellington [place], MD;  Location: [place] [facility];  Service: Cardiovascular;  Laterality: N/A;  . LEFT HEART CATH AND CORONARY ANGIOGRAPHY N/A 06/15/2018   Procedure: LEFT HEART CATH AND CORONARY ANGIOGRAPHY;  Surgeon: Wellington [place], MD;  Location: [place] [facility];  Service: Cardiovascular;  Laterality: N/A;  . LYMPHADENECTOMY    . SPLENECTOMY, PARTIAL    . vulvulasectomy      Past Gynecologic History:  Menarche: age 81 Menstrual details- irregular menstrual periods Hysterectomy ~ 25 years ago History of abnormal pap: yes History of STDs: denies Sexually active: denies  OB History:  [id number] Vaginal delivery  OB History  No obstetric history on file.    Family History: Family History  Problem Relation Age of Onset  . Non-Hodgkin's lymphoma Mother   . Osteoporosis Mother   . Lupus Sister   . Heart disease Brother   . Heart attack Brother   . Heart attack Father     Social History: Social History   Socioeconomic History  . Marital status: Widowed    Spouse name: Not on file  . Number of children: 4  . Years of education: Not on file  . Highest education level: Not on file  Occupational History  . Occupation: disabled  Social Needs  . Financial resource strain: Not very hard  . Food insecurity:    Worry: Never true    Inability: Never true  . Transportation needs:    Medical: No    Non-medical: No  Tobacco Use  . Smoking status: Former Smoker    Packs/day: 0.50    Years: 30.00    Pack years: 15.00    Types: Cigarettes    Last attempt to quit: 12/19/2017    Years since quitting: 0.9  . Smokeless tobacco: Never Used  . Tobacco comment: quit 12/19/2017.  Substance and Sexual Activity  . Alcohol use: No  . Drug use: No  . Sexual activity: Not Currently  Lifestyle  . Physical activity:    Days per week: 7 days    Minutes per session: 30 min  . Stress: Very much  Relationships  . Social connections:    Talks on phone: More than three times a  week    Gets together: Patient refused    Attends religious service: Never    Active member of club or organization: No    Attends meetings of clubs or organizations: Never    Relationship status: Widowed  . Intimate partner violence:    Fear of current or ex partner: No    Emotionally abused: No    Physically abused: No    Forced sexual activity: No  Other Topics Concern  . Not on file  Social History Narrative   Lives in [place] by herself.  Does not routinely exercise.    Allergies: Allergies  Allergen Reactions  . Bee Venom Itching, Shortness Of Breath and Swelling  . Metformin And Related Shortness Of Breath and Swelling  . Darvon [Propoxyphene] Itching  . Gabapentin Swelling  . Nsaids Other (See Comments)    Ulcers   .  Tramadol Hives  . Contrast Media [Iodinated Diagnostic Agents] Rash    If you benadryl, and steroids she is able to take the contrast per pt    Current Medications: Current Outpatient Medications  Medication Sig Dispense Refill  . acetaminophen (TYLENOL) 325 MG tablet Take 650 mg by mouth every 6 (six) hours as needed for mild pain.     Marland Kitchen albuterol (PROAIR HFA) 108 (90 Base) MCG/ACT inhaler Inhale 1 puff into the lungs every 6 (six) hours as needed for wheezing or shortness of breath.     Marland Kitchen aluminum sulfate-calcium acetate (DOMEBORO) packet Apply 1 packet topically 2 (two) times daily as needed.    Marland Kitchen aspirin 81 MG tablet Take 81 mg by mouth daily.    Marland Kitchen atorvastatin (LIPITOR) 80 MG tablet Take 1 tablet (80 mg total) by mouth daily at 6 PM. 90 tablet 3  . Blood Glucose Monitoring Suppl (GLUCOCOM BLOOD GLUCOSE MONITOR) DEVI 1 m.    . citalopram (CELEXA) 40 MG tablet Take 1 tablet (40 mg total) by mouth daily. 90 tablet 1  . esomeprazole (NEXIUM) 40 MG capsule Take 40 mg by mouth daily.     . insulin glargine (LANTUS) 100 UNIT/ML injection Inject 60 Units into the skin.    Elmore Guise Devices (SIMPLE DIAGNOSTICS LANCING DEV) MISC AS DIRECTED    .  lidocaine (XYLOCAINE) 2 % jelly Apply topically as needed.    Marland Kitchen LORazepam (ATIVAN) 1 MG tablet Take 1 tablet by mouth twice daily as needed for anxiety 60 tablet 2  . mirtazapine (REMERON) 15 MG tablet Take 15 mg by mouth at bedtime.    . nitroGLYCERIN (NITROSTAT) 0.4 MG SL tablet Place 1 tablet (0.4 mg total) under the tongue every 5 (five) minutes as needed for chest pain. 10 tablet 0  . ondansetron (ZOFRAN ODT) 4 MG disintegrating tablet Take 1 tablet (4 mg total) by mouth every 8 (eight) hours as needed. 20 tablet 0  . oxyCODONE 30 MG 12 hr tablet Take 1 tablet by mouth 2 (two) times a day.    . Oxycodone HCl 10 MG TABS Take 1 tablet by mouth every 4 (four) hours as needed.    . promethazine (PHENERGAN) 25 MG tablet Take 25 mg by mouth every 6 (six) hours as needed for nausea. for nausea  3  . ranolazine (RANEXA) 1000 MG SR tablet Take 1 tablet (1,000 mg total) by mouth 2 (two) times daily. 180 tablet 3  . silver sulfADIAZINE (SILVADENE) 1 % cream Apply topically 2 (two) times a day.    . ticagrelor (BRILINTA) 90 MG TABS tablet Take 90 mg by mouth 2 (two) times daily.    . BD VEO INSULIN SYRINGE U/F 31G X 15/64" 0.5 ML MISC 4 (four) times daily. use as directed    . cyclobenzaprine (FLEXERIL) 5 MG tablet Take 1 tablet (5 mg total) by mouth 3 (three) times daily as needed for muscle spasms. (Patient not taking: Reported on 11/22/2018) 10 tablet 0  . insulin aspart (NOVOLOG) 100 UNIT/ML injection Inject 25 Units into the skin 3 (three) times daily before meals. Take 10 units PLUS sliding scale (Patient taking differently: Inject 35 Units into the skin 3 (three) times daily before meals. Take 10 units PLUS sliding scale ) 22.5 mL 1  . insulin glargine (LANTUS) 100 UNIT/ML injection Inject 0.55 mLs (55 Units total) into the skin 2 (two) times daily. (Patient taking differently: Inject 60 Units into the skin 2 (two) times daily. ) 33  mL 1   No current facility-administered medications for this visit.     Review of Systems General:  Fatigue & weakness- chronic Skin: boil/abscess right bottom Eyes: no complaints HEENT: no complaints Breasts: no complaints Pulmonary: no complaints Cardiac: no complaints Gastrointestinal: decreased appetite, nausea, vomiting, diarrhea- chronic Genitourinary/Sexual: problems with bladder function Ob/Gyn: vaginal complains per hpi Musculoskeletal: back pain- chronic Hematology: On aspirin and Brilinta Neurologic/Psych: numbness, tingling   Objective:  Physical Examination:  BP 134/74 (BP Location: Right Arm, Patient Position: Sitting)   Pulse 74   Temp 98.5 F (36.9 C) (Tympanic)   Resp 20   Ht 5\' 1"  (1.549 m)   Wt 135 lb 6.4 oz (61.4 kg)   BMI 25.58 kg/m     ECOG Performance Status: 1 - Symptomatic but completely ambulatory  GENERAL:  no acute distress HEENT:  PERRL, neck supple with midline trachea.  ABDOMEN:  Soft, nontender, nondistended.  EXTREMITIES:  No peripheral edema.   NEURO:  Nonfocal. Well oriented.  Appropriate affect.  Pelvic: EGBUS:  vulvar with erythema diffusely suspect due to radiation skin toxicity; previously seen sharply defined velvety changes involving the bilateral labia and clitoral hood; right vulvar area adjacent to urethra, and perianal lesion now resolved. There was greenish discharge on the urethra.   I&O catheterization performed and 1 cc of clear urine removed.     The remainder of the exam was deferred. Exam findings from 07/05/2018 Vagina: small 5-8 mm nodules noted otherwise negative. No bleeding.  Uterus: surgically absent Cervix: surgically absent    Lab Review No labs on site today.  - HIV - nonreactive 06/13/2018  Radiologic Imaging: No imaging on site today    Assessment:  Renee Mack is a 53 y.o. female diagnosed with history of stage I vulvar cancer 1.3 mm invasion treated with left radical vulvectomy and bilateral groin node dissection in 05/05/2004; followed by right vulvectomy for  microinvasive vulvar cancer 04/2012; MOHS surgery right groin for [facility] 10/04/2012; subsequent VIN2 at least disease on biopsy 03/2017; then lost to follow up. Exam concerning for VIN versus vulvar cancer recurrence. Vulvar Biopsy at least VIN3 and definitive invasion can not be assessed.   Poor candidate for topical imiquimod or 5FU therapy. Poor surgical candidate as anti-platelet therapy can not be discontinued due to MI.   Perianal lesion concern for malignancy, biopsy c/w high grade squamous intraepithelial lesion  S/p vulvar radiation therapy; no gross evidence of disease.   Dysuria and clinical exam findings concerning for UTI: s/p I&O negative for purulent urine.   HGSIL Vaginal Pap, negative for HRHPV types tested.   History of symptomatic vulvar candidiasis   Medical co-morbidities complicating care:History of ST elevation myocardial infarction (STEMI); CAD; type 2 DM; Chronic systolic heart failure ([place]); Ischemic cardiomyopathy; Protein-calorie malnutrition, severe; COPD. Currently on anticoagulation with ticagrelor CAD s/p NSTEMI 12/2017 stenting x 5; s/p cardiac catherization 05/22/2018 and percutaneous angioplasty of prior stent.  Anticoagulation was discussed with cardiology prior to biopsy and they advised that patient was too high risk to hold medication.    Plan:   Problem List Items Addressed This Visit      Genitourinary   Vulvar intraepithelial neoplasia (VIN) grade 3    Other Visit Diagnoses    Dysuria    -  Primary   Relevant Orders   Urinalysis, Complete w Microscopic   Vulvar pain       Abnormal Pap smear of vagina         Follow up with  Radiation Oncology in July and return to our clinic in August for repeat evaluation.   Check ua and culture to assess for UTI  HSIL vaginal Pap with negative HRHPV testing. Follow up Pap in one year, due in 12/20.   I personally had a face to face interaction and evaluated the patient jointly with the NP, Ms. Beckey Rutter.  I  have reviewed her history and available records and have performed the key portions of the physical exam including general, HEENT, abdominal exam, pelvic exam with my findings confirming those documented above by the APP.  I have discussed the case with the APP and the patient.  I agree with the above documentation, assessment and plan which was fully formulated by me.  Counseling was completed by me.   I personally saw the patient and performed a substantive portion of this encounter in conjunction with the listed APP as documented above.  A total of 20 minutes were spent with the patient/family today; >50% was spent in education, counseling and coordination of care for ovarian cancer.   Angeles Gaetana Michaelis, MD  CC:  Olin Hauser, DO [address] [place], [place] [address] [phone]

## 2018-11-22 NOTE — Telephone Encounter (Signed)
Called and notified Ms. Coplen with negative U/A results. No signs of infection. Continue to keep radiation area clean.

## 2018-11-27 ENCOUNTER — Other Ambulatory Visit: Payer: Self-pay | Admitting: Family Medicine

## 2018-11-27 DIAGNOSIS — F418 Other specified anxiety disorders: Secondary | ICD-10-CM

## 2018-11-27 DIAGNOSIS — Z515 Encounter for palliative care: Secondary | ICD-10-CM

## 2018-11-27 DIAGNOSIS — E1140 Type 2 diabetes mellitus with diabetic neuropathy, unspecified: Secondary | ICD-10-CM

## 2018-11-27 DIAGNOSIS — IMO0002 Reserved for concepts with insufficient information to code with codable children: Secondary | ICD-10-CM

## 2018-11-28 ENCOUNTER — Telehealth: Payer: Self-pay | Admitting: Family Medicine

## 2018-11-28 DIAGNOSIS — F418 Other specified anxiety disorders: Secondary | ICD-10-CM

## 2018-11-28 MED ORDER — CITALOPRAM HYDROBROMIDE 40 MG PO TABS: 40.0000 mg | ORAL_TABLET | Freq: Every day | ORAL | 1 refills | Status: DC

## 2018-11-28 NOTE — Telephone Encounter (Signed)
Pt  Called requesting refill on citalopram

## 2018-12-04 ENCOUNTER — Ambulatory Visit (INDEPENDENT_AMBULATORY_CARE_PROVIDER_SITE_OTHER): Payer: Medicaid Other | Admitting: Family Medicine

## 2018-12-04 DIAGNOSIS — F418 Other specified anxiety disorders: Secondary | ICD-10-CM

## 2018-12-04 NOTE — Progress Notes (Signed)
Patient was not reached at time of virtual appointment by phone. Apt cancelled same day. Patient was not evaluated or given medical advice today.  Nobie Putnam, [facility] 12/04/2018, 9:39 PM

## 2018-12-07 ENCOUNTER — Ambulatory Visit (INDEPENDENT_AMBULATORY_CARE_PROVIDER_SITE_OTHER): Payer: Medicaid Other | Admitting: Family Medicine

## 2018-12-07 ENCOUNTER — Other Ambulatory Visit: Payer: Self-pay

## 2018-12-07 ENCOUNTER — Encounter: Payer: Self-pay | Admitting: Family Medicine

## 2018-12-07 VITALS — BP 102/67 | HR 69

## 2018-12-07 DIAGNOSIS — F418 Other specified anxiety disorders: Secondary | ICD-10-CM

## 2018-12-07 DIAGNOSIS — Z515 Encounter for palliative care: Secondary | ICD-10-CM

## 2018-12-07 NOTE — Patient Instructions (Signed)
AVS info given by phone. No MyChart access 

## 2018-12-07 NOTE — Assessment & Plan Note (Signed)
For Vulvar cancer, [facility] S/p palliative radiation treatment per oncology -On opiate pain management  Agree to continue BDZ Ativan, see a&p anxiety

## 2018-12-07 NOTE — Assessment & Plan Note (Signed)
Improved Chronic GAD with mixed features of depression, some related to life stressors and recent passed family members as well as personal health decline  Plan: 1. Continue current therapy Celexa 40mg  daily 2. Continue current Ativan 1mg  BID PRN - reviewed [place] CSRS, appropriate, last filled 11/2018, opiates per palliative pain management - Continue f/u with [facility] Dr Jacqualine Code

## 2018-12-07 NOTE — Progress Notes (Signed)
Virtual Visit via Telephone The purpose of this virtual visit is to provide medical care while limiting exposure to the novel coronavirus (COVID19) for both patient and office staff.  Consent was obtained for phone visit:  Yes.   Answered questions that patient had about telehealth interaction:  Yes.   I discussed the limitations, risks, security and privacy concerns of performing an evaluation and management service by telephone. I also discussed with the patient that there may be a patient responsible charge related to this service. The patient expressed understanding and agreed to proceed.  Patient Location: Home Provider Location: [facility])  ---------------------------------------------------------------------- Chief Complaint  Patient presents with  . Anxiety    S: Reviewed CMA documentation. I have called patient and gathered additional HPI as follows:  FOLLOW-UP Vulvar Cancer Stage 3 / Chronic Pain / Palliative Care Followed by [facility], for palliative radiation, Dr Christel Mormon, completed 7 weeks radiation therapy on 11/09/18. On Palliative Care and Pain Management. Currently on Oxycontin and Oxycodone  Follow-up Anxiety, with depression Chronic problem, worse due to medical conditions - now seems improved on current regimen, last visit increased Citalopram from 20 up to 40mg  Followed by [facility] Dr Jacqualine Code Psychiatry Currently on Celexa (Citalopram) 40mg  daily, Remeron 15mg  nightly, and Ativan 1mg   - She is taking Ativan 1mg  BID every day, without known history of withdrawal. Last ordered/filled 11/2018 - She says palliative care is comfortable with this treatment and they recommend to continue on it  Denies any high risk travel to areas of current concern for COVID19. Denies any known or suspected exposure to person with or possibly with COVID19.  Denies any fevers, chills, sweats, body ache, cough, shortness of breath, sinus pain or pressure,  headache, abdominal pain, diarrhea  Past Medical History:  Diagnosis Date  . Cancer ([place])    vulvular  . Cervical disc disease    a. 01/2018 MRI Cervical spine: Cervical spondylosis with multilevel disc and facet degeneration greatest at C5-6 and C6-7.  Moderate to sev R C5-6, mod L C5-6, and mod bilat C6-7 foraminal stenosis with multilevel mild foraminal stenosis.  Mild C5-6 and mod C6-7 canal stenosis. C6-7 central cord impingement w/ cord flattening.  . Chronic combined systolic (congestive) and diastolic (congestive) heart failure ([place])    a. 12/2017 Echo: EF 30-35%, mid-apicalanteroseptal, ant, and apical AK. Gr1 DD. Mild conc LVH; b. 03/2018 Echo: EF 45-50%, antsept, ant HK. Mild MR. Nl LA size. Nl RV fxn.  Marland Kitchen COPD (chronic obstructive pulmonary disease) (H[facility])    not on home oxygen  . Coronary artery disease    a. 12/2017 ACS/PCI: LAD 100p (2.25x26 Resolute Onyx DES), 60m (2.0x12 Resolute Onyx DES), 80d (2.0x15 Resolute Onyx DES), RCA 90p (non-dominant). EF 25-35%. Post-MI course complicated by CGS; b. 06/6107 NSTEMI/subacute thrombosis-->LAD 100 (PTCA + DES x 1); c. 01/2018 NSTEMI/PCI: LM min irregs, LAD 50-60p ISR/hazy (3.5x12 Resolute DES), 82m/d (underexpansion of prior stents-->PTCA). EF 45-50%.  . Diabetes mellitus without complication ([place])   . GERD (gastroesophageal reflux disease)   . H/O degenerative disc disease   . Hypotension   . Ischemic cardiomyopathy    a. 12/2017 Echo: EF 30-35%; b. 03/2018 Echo: EF 45-50%.  . Myocardial infarction ([place])    a. 12/2017-->DES to LAD x 3.  . Pancreatitis   . Shingles   . Spinal stenosis   . Tobacco abuse   . Ulcer (traumatic) of oral mucosa   . Urinary incontinence   . VIN III (vulvar intraepithelial  neoplasia III)    Social History   Tobacco Use  . Smoking status: Former Smoker    Packs/day: 0.50    Years: 30.00    Pack years: 15.00    Types: Cigarettes    Quit date: 12/19/2017    Years since quitting: 0.9  . Smokeless tobacco:  Never Used  . Tobacco comment: quit 12/19/2017.  Substance Use Topics  . Alcohol use: No  . Drug use: No    Current Outpatient Medications:  .  acetaminophen (TYLENOL) 325 MG tablet, Take 650 mg by mouth every 6 (six) hours as needed for mild pain. , Disp: , Rfl:  .  albuterol (PROAIR HFA) 108 (90 Base) MCG/ACT inhaler, Inhale 1 puff into the lungs every 6 (six) hours as needed for wheezing or shortness of breath. , Disp: , Rfl:  .  aluminum sulfate-calcium acetate (DOMEBORO) packet, Apply 1 packet topically 2 (two) times daily as needed., Disp: , Rfl:  .  aspirin 81 MG tablet, Take 81 mg by mouth daily., Disp: , Rfl:  .  atorvastatin (LIPITOR) 80 MG tablet, Take 1 tablet (80 mg total) by mouth daily at 6 PM., Disp: 90 tablet, Rfl: 3 .  BD VEO INSULIN SYRINGE U/F 31G X 15/64" 0.5 ML MISC, 4 (four) times daily. use as directed, Disp: , Rfl:  .  Blood Glucose Monitoring Suppl (GLUCOCOM BLOOD GLUCOSE MONITOR) DEVI, 1 m., Disp: , Rfl:  .  citalopram (CELEXA) 40 MG tablet, Take 1 tablet (40 mg total) by mouth daily., Disp: 90 tablet, Rfl: 1 .  esomeprazole (NEXIUM) 40 MG capsule, Take 40 mg by mouth daily. , Disp: , Rfl:  .  insulin glargine (LANTUS) 100 UNIT/ML injection, Inject 60 Units into the skin 2 (two) times daily. , Disp: , Rfl:  .  Lancet Devices (SIMPLE DIAGNOSTICS LANCING DEV) MISC, AS DIRECTED, Disp: , Rfl:  .  lidocaine (XYLOCAINE) 2 % jelly, Apply topically as needed., Disp: , Rfl:  .  LORazepam (ATIVAN) 1 MG tablet, Take 1 tablet by mouth twice daily as needed for anxiety, Disp: 60 tablet, Rfl: 2 .  mirtazapine (REMERON) 15 MG tablet, Take 15 mg by mouth at bedtime., Disp: , Rfl:  .  nitroGLYCERIN (NITROSTAT) 0.4 MG SL tablet, Place 1 tablet (0.4 mg total) under the tongue every 5 (five) minutes as needed for chest pain., Disp: 10 tablet, Rfl: 0 .  ondansetron (ZOFRAN ODT) 4 MG disintegrating tablet, Take 1 tablet (4 mg total) by mouth every 8 (eight) hours as needed., Disp: 20  tablet, Rfl: 0 .  oxyCODONE (OXYCONTIN) 30 MG 12 hr tablet, Take 1 tablet by mouth 2 (two) times a day., Disp: , Rfl:  .  Oxycodone HCl 10 MG TABS, Take 1 tablet by mouth every 4 (four) hours as needed., Disp: , Rfl:  .  promethazine (PHENERGAN) 25 MG tablet, Take 25 mg by mouth every 6 (six) hours as needed for nausea. for nausea, Disp: , Rfl: 3 .  ranolazine (RANEXA) 1000 MG SR tablet, Take 1 tablet (1,000 mg total) by mouth 2 (two) times daily., Disp: 180 tablet, Rfl: 3 .  silver sulfADIAZINE (SILVADENE) 1 % cream, Apply topically 2 (two) times a day., Disp: , Rfl:  .  ticagrelor (BRILINTA) 90 MG TABS tablet, Take 90 mg by mouth 2 (two) times daily., Disp: , Rfl:  .  cyclobenzaprine (FLEXERIL) 5 MG tablet, Take 1 tablet (5 mg total) by mouth 3 (three) times daily as needed for muscle spasms. (Patient  not taking: Reported on 12/07/2018), Disp: 10 tablet, Rfl: 0 .  insulin aspart (NOVOLOG) 100 UNIT/ML injection, Inject 25 Units into the skin 3 (three) times daily before meals. Take 10 units PLUS sliding scale (Patient taking differently: Inject 35 Units into the skin 3 (three) times daily before meals. Take 10 units PLUS sliding scale ), Disp: 22.5 mL, Rfl: 1 .  insulin glargine (LANTUS) 100 UNIT/ML injection, Inject 0.55 mLs (55 Units total) into the skin 2 (two) times daily. (Patient taking differently: Inject 60 Units into the skin 2 (two) times daily. ), Disp: 33 mL, Rfl: 1  Depression screen [facility] 2/9 12/07/2018 09/04/2018 05/31/2018  Decreased Interest 2 2 1   Down, Depressed, Hopeless 2 2 1   PHQ - 2 Score 4 4 2   Altered sleeping 0 3 2  Tired, decreased energy 3 3 1   Change in appetite 3 2 2   Feeling bad or failure about yourself  0 0 2  Trouble concentrating 1 2 2   Moving slowly or fidgety/restless 0 0 1  Suicidal thoughts 0 0 0  PHQ-9 Score 11 14 12   Difficult doing work/chores Not difficult at all Not difficult at all Not difficult at all    GAD 7 : Generalized Anxiety Score 12/07/2018  09/04/2018 05/31/2018 02/27/2018  Nervous, Anxious, on Edge 2 3 2 2   Control/stop worrying 1 3 2 2   Worry too much - different things 1 3 2 3   Trouble relaxing 2 3 2 3   Restless 1 3 1 2   Easily annoyed or irritable 0 2 1 2   Afraid - awful might happen 1 2 1 3   Total GAD 7 Score 8 19 11 17   Anxiety Difficulty Somewhat difficult Not difficult at all Not difficult at all Somewhat difficult    -------------------------------------------------------------------------- O: No physical exam performed due to remote telephone encounter.  Lab results reviewed.  Recent Results (from the past 2160 hour(s))  Urinalysis, Complete w Microscopic     Status: Abnormal   Collection Time: 11/22/18 11:20 AM  Result Value Ref Range   Color, Urine YELLOW (A) YELLOW   APPearance CLEAR (A) CLEAR   Specific Gravity, Urine 1.013 1.005 - 1.030   pH 5.0 5.0 - 8.0   Glucose, UA >=500 (A) NEGATIVE mg/dL   Hgb urine dipstick NEGATIVE NEGATIVE   Bilirubin Urine NEGATIVE NEGATIVE   Ketones, ur NEGATIVE NEGATIVE mg/dL   Protein, ur NEGATIVE NEGATIVE mg/dL   Nitrite NEGATIVE NEGATIVE   Leukocytes,Ua NEGATIVE NEGATIVE   RBC / HPF 0-5 0 - 5 RBC/hpf   WBC, UA 6-10 0 - 5 WBC/hpf   Bacteria, UA NONE SEEN NONE SEEN   Squamous Epithelial / LPF 0-5 0 - 5    Comment: Performed at [facility], [address] [place], [place] [address]    -------------------------------------------------------------------------- A&P:  Problem List Items Addressed This Visit    Anxiety associated with depression - Primary    Improved Chronic GAD with mixed features of depression, some related to life stressors and recent passed family members as well as personal health decline  Plan: 1. Continue current therapy Celexa 40mg  daily 2. Continue current Ativan 1mg  BID PRN - reviewed [place] CSRS, appropriate, last filled 11/2018, opiates per palliative pain management - Continue f/u with [facility] Psychiatry Dr Jacqualine Code       Palliative  care patient    For Vulvar cancer, [facility]/p palliative radiation treatment per oncology -On opiate pain management  Agree to continue BDZ Ativan, see a&p anxiety  No orders of the defined types were placed in this encounter.   Follow-up: - Return in 3 months for Anxiety / med refills  Patient verbalizes understanding with the above medical recommendations including the limitation of remote medical advice.  Specific follow-up and call-back criteria were given for patient to follow-up or seek medical care more urgently if needed.   - Time spent in direct consultation with patient on phone: 9 minutes  Nobie Putnam, [facility] 12/07/2018, 11:22 AM

## 2019-01-24 ENCOUNTER — Ambulatory Visit: Payer: Medicaid Other

## 2019-01-24 ENCOUNTER — Inpatient Hospital Stay: Payer: Medicaid Other | Attending: Obstetrics and Gynecology

## 2019-01-24 NOTE — Progress Notes (Deleted)
Gynecologic Oncology Interval Visit   Referring Provider: Dr. Parks Ranger  Chief Complaint: VIN 3  Subjective:  Renee Mack is a 53 y.o. female s/p TH for "uterine cancer" and h/o vulvar cancer, initially seen in consultation for Dr. Parks Ranger for possible recurrent vulvar cancer, who returns to clinic today for management of VIN 3 s/p radiation therapy.   Given degree of vulvar discomfort, extensive VIN3, poor surgical candidate she was referred to radiation oncology and received radiation with Dr. Christel Mormon which she completed 11/09/2018. She was referred to pain management for chronic pain and elected to be followed by palliative care, Dr. Joylene Draft at [facility].   She received radiation with Dr. Christel Mormon 09/26/2018- 11/09/2018.  It was suggested that she follow-up with him in July but she has not been seen.  She was last seen by Dr. Theora Gianotti 11/22/2018.  Skin changes thought to be secondary to radiation.  She was complaining of dysuria- I&O catheterization was performed with UA negative for infection.   ***She has persistent fatigue, weakness, vaginal discharge, itching, spotting, and pain. She continues oxycontin 30 mg BID and oxycodone 10mg  q4h prn for breakthrough. She says that Dr. Fletcher Anon, Cardiology did permit her to come off of anticoagulation for 5 days if surgery was needed. She complains of a boil/abscess on her right bottom which she thinks may have burst this morning. She does have skin toxicity due to radiation and is using SSD cream BID, topical lidocaine, and [place] soaks. Her symptoms have improved slightly over the last week.   Gynecologic Oncology History: Renee Mack is a pleasant female s/p TH for "uterine cancer" and h/o vulvar cancer, initially seen in consultation for Dr. Parks Ranger for possible recurrent vulvar cancer, who presented for management of VIN 3. Her history is as follows.  ~ 1994  History of 'uterine cancer' s/p hysterectomy in [place] for patient reported 'dysplasia;  they'd tried burning and cutting it out'- vaginal hysterectomy; Retained ovaries and 1 tube.  05/05/2004- stage I vulvar cancer 1.3 mm invasion treated with left radical vulvectomy and bilateral groin node dissection 2013-   pap benign HPV negative 04/2012- right vulvectomy for microinvasive vulvar cancer and been 3 by Dr. Maudie Mercury 10/04/2012  MOHS surgery right groin for [facility] 10/15/2014- vulvar colposcopy revealed no suspicious lesions; patient was well-appearing and transferred to ED where she was diagnosed with DKA and pancreatitis 04/07/2017- presented to ED with vulvar pain exam notable for cobblestone epithelium at left labia minora (biopsied), cobblestoned, irritated appearing mucosa to left of urethra and vagina.  Approximately 2 mm isolated vesicle on right labia.    Macerated erythematous skin at buttocks abutting anus appearing chronically moist.  White epithelium at 6:00 of the anus but perianal exam limited by lack of stirrups.  Vulvar biopsy showed HSIL at least VIN 2. 04/12/17-  seen for ER follow-up by Dr. [name]. Per note, at least recurrent disease amenable to resection.  Left upper aspect of vulva not amenable to outpatient laser.  May also have right-sided disease but patient declined vulvar biopsy. Chronic vulvar pain not reproducible. 11/13/2017-11/16/2017-admitted to hospital for vulvar cellulitis and underwent I&D, treated with broad-spectrum antibiotics and discharged on Bactrim. 11/24/2017-  ER visit for severe vulvovaginal candidiasis treated with oral fluconazole and topical miconazole with oxycodone for breakthrough pain.   Patient lost to follow-up.   She has history of type 2 diabetes mellitus with hemoglobin A1c of 11.9% on 03/24/18 down from 15.1% on 11/14/2017. Current smoker. CAD s/p NSTEMI 12/2017 stenting x 5, chronic combined  systolic and diastolic heart failure, ischemic cardiomyopathy, GERD, pancreatitis, multiple episodes of DKA with recurrent UTI, depression, panic disorder,  chronic pain syndrome. LVEF 45-50%.  She says that recent heart attack "scared her" and that she realized that she needs to start taking better care of herself.  She has been advised that she needs additional surgery for treatment of vulvar cancer but cannot have surgery until her blood sugars are under better control. She continues indefinite anticoagulation with aspirin and brilinta (antiplatelet). She has vulvar pain , worse with urination, urinary incontinence. She also complains of night sweats, headaches.  She was discharged from hospital yesterday, 05/23/18 after admission for hyperglycemia and unstable angina with underlying CAD. She underwent cardiac catheterization and percutaneous angioplasty of prior stent.  Per her report she had a Pap 6 months ago but in the computer her last Pap was 03/2012 ([facility]). She complains of vulvar pain.    Vaginal Pap on 05/24/18 - HSIL-epithelial cell abnormality/HSILVB.  Vulvar lesion on exam concerning for VIN versus vulvar cancer recurrence.  She returns today for biopsy.  She is currently on anticoagulation per tecagrelor and aspirin post NSTEMI and cardiac cath on 05/22/18.  Cardiology was consulted for recommendation and they advised not to hold antiplatelet medications due to her very high cardiac risk.  She was hospitalized on 05/28/2018-05/29/2018 for hyperglycemia and intractable nausea and vomiting.  She was discharged home.   On 06/07/18 she underwent a vulvar biopsy with Dr. Fransisca Connors.  DIAGNOSIS:  A. VULVA; BIOPSY:  - AT LEAST HIGH-GRADE SQUAMOUS INTRAEPITHELIAL LESION (VIN 3; SEVERE DYSPLASIA).  - DUE TO FRAGMENTATION AND TANGENTIAL TISSUE ORIENTATION, DEFINITIVE INVASION CANNOT BE RELIABLY ASSESSED.   Vaginal Pap on 05/24/18 - HSIL-epithelial cell abnormality/HSIL; negative for HRHPV tested.   Previous discussions with cardiology advised against stopping anti-platelet therapies for procedures or treatments. Given this, management options were  discussed including radiation vs imiquimod. She expressed considerable pain involving the vulvar and perianal area. She is using Tylenol as well as lidocaine 2% jelly but has minimal relief.   She tried imiquimod but was unable to tolerate the therapy due to vulvar pain/irritation.   Seen in clinic by Dr. Theora Gianotti on 07/05/2018. Perianal biopsy performed.   A. Perianal Lesion- left; biopsy:  - high grade squamous intraepithelial lesion (HSIL- classifiable as PAIN-3, warty type).  - candidiasis  Given that cardiology advised against stopping anti-platelet therapies, we discussed radiation vs imiquimod but advised that she was a poor candidate for topical therapies. She was referred to radiation and saw Dr. Baruch Gouty on 07/12/2018. Exam concerning for secondary infection and she was started on bactrim ds BID x 14 days and given topical econazole.   Both vulvar and perianal biopsies were sent to [facility] for pathology review.   A.  Perianal lesion, left, biopsy.  Outside consult, [id number], [facility], [place], [place]. Date of procedure 07-05-18: - High grade squamous intraepithelial lesion (HSIL)/anal intraepithelial neoplasia-3 (AIN/PAIN-3). By report, fungal forms consistent with Candida sp. were visualized on PASF staining.  She saw Dr. Baruch Gouty for follow-up on 07/31/2018. Exam again concerning for cellulitis but overall improved. Referred to [facility]. Christel Mormon at [facility] for radiation therapy given the complexity of her anatomical distribution.      Problem List: Patient Active Problem List   Diagnosis Date Noted  . Palliative care patient 09/04/2018  . Hyperglycemia 05/28/2018  . Vulvar intraepithelial neoplasia (VIN) grade 3 05/24/2018  . History of ST elevation myocardial infarction (STEMI) 02/27/2018  . CAD (coronary artery disease)  02/27/2018  . Hyperlipidemia associated with type 2 diabetes mellitus ([place]) 02/27/2018  . Anxiety associated with depression 02/27/2018  . Chronic  systolic heart failure ([place])   . Chest pain 12/26/2017  . Unstable angina ([place])   . Ischemic cardiomyopathy   . Former smoker   . Protein-calorie malnutrition, severe 07/18/2017  . Uncontrolled type 2 diabetes with neuropathy ([place]) 12/28/2014  . Centrilobular emphysema ([place]) 12/28/2014  . GERD (gastroesophageal reflux disease) 12/28/2014  . History of shingles 12/28/2014  . Hyperlipidemia LDL goal <70 12/28/2014    Past Medical History: Past Medical History:  Diagnosis Date  . Cancer ([place])    vulvular  . Cervical disc disease    a. 01/2018 MRI Cervical spine: Cervical spondylosis with multilevel disc and facet degeneration greatest at C5-6 and C6-7.  Moderate to sev R C5-6, mod L C5-6, and mod bilat C6-7 foraminal stenosis with multilevel mild foraminal stenosis.  Mild C5-6 and mod C6-7 canal stenosis. C6-7 central cord impingement w/ cord flattening.  . Chronic combined systolic (congestive) and diastolic (congestive) heart failure ([place])    a. 12/2017 Echo: EF 30-35%, mid-apicalanteroseptal, ant, and apical AK. Gr1 DD. Mild conc LVH; b. 03/2018 Echo: EF 45-50%, antsept, ant HK. Mild MR. Nl LA size. Nl RV fxn.  Marland Kitchen COPD (chronic obstructive pulmonary disease) (HCC)    not on home oxygen  . Coronary artery disease    a. 12/2017 ACS/PCI: LAD 100p (2.25x26 Resolute Onyx DES), 18m (2.0x12 Resolute Onyx DES), 80d (2.0x15 Resolute Onyx DES), RCA 90p (non-dominant). EF 25-35%. Post-MI course complicated by CGS; b. 12/1060 NSTEMI/subacute thrombosis-->LAD 100 (PTCA + DES x 1); c. 01/2018 NSTEMI/PCI: LM min irregs, LAD 50-60p ISR/hazy (3.5x12 Resolute DES), 82m/d (underexpansion of prior stents-->PTCA). EF 45-50%.  . Diabetes mellitus without complication ([place])   . GERD (gastroesophageal reflux disease)   . H/O degenerative disc disease   . Hypotension   . Ischemic cardiomyopathy    a. 12/2017 Echo: EF 30-35%; b. 03/2018 Echo: EF 45-50%.  . Myocardial infarction ([place])    a. 12/2017-->DES to LAD x 3.   . Pancreatitis   . Shingles   . Spinal stenosis   . Tobacco abuse   . Ulcer (traumatic) of oral mucosa   . Urinary incontinence   . VIN III (vulvar intraepithelial neoplasia III)     Past Surgical History: Past Surgical History:  Procedure Laterality Date  . ABDOMINAL HYSTERECTOMY     partial  . CORONARY BALLOON ANGIOPLASTY N/A 05/22/2018   Procedure: CORONARY BALLOON ANGIOPLASTY;  Surgeon: Wellington [place], MD;  Location: [facility];  Service: Cardiovascular;  Laterality: N/A;  . CORONARY STENT INTERVENTION N/A 12/26/2017   Procedure: CORONARY STENT INTERVENTION;  Surgeon: Wellington [place], MD;  Location: [facility];  Service: Cardiovascular;  Laterality: N/A;  . CORONARY STENT INTERVENTION N/A 02/08/2018   Procedure: CORONARY STENT INTERVENTION;  Surgeon: Nelva Bush, MD;  Location: [facility];  Service: Cardiovascular;  Laterality: N/A;  . CORONARY/GRAFT ACUTE MI REVASCULARIZATION N/A 12/19/2017   Procedure: Coronary/Graft Acute MI Revascularization;  Surgeon: Wellington [place], MD;  Location: [facility];  Service: Cardiovascular;  Laterality: N/A;  . ECTOPIC PREGNANCY SURGERY Left   . INTRAVASCULAR ULTRASOUND/IVUS N/A 02/08/2018   Procedure: Intravascular Ultrasound/IVUS;  Surgeon: Nelva Bush, MD;  Location: [facility];  Service: Cardiovascular;  Laterality: N/A;  . LEFT HEART CATH AND CORONARY ANGIOGRAPHY N/A 12/19/2017   Procedure: LEFT HEART CATH AND CORONARY ANGIOGRAPHY;  Surgeon:  Wellington [place], MD;  Location: [facility];  Service: Cardiovascular;  Laterality: N/A;  . LEFT HEART CATH AND CORONARY ANGIOGRAPHY N/A 12/26/2017   Procedure: LEFT HEART CATH AND CORONARY ANGIOGRAPHY;  Surgeon: Wellington [place], MD;  Location: [facility];  Service: Cardiovascular;  Laterality: N/A;  . LEFT HEART CATH AND CORONARY ANGIOGRAPHY N/A 02/08/2018   Procedure: LEFT HEART CATH AND CORONARY ANGIOGRAPHY;  Surgeon: Nelva Bush, MD;  Location: [facility];  Service: Cardiovascular;  Laterality: N/A;  . LEFT HEART CATH AND CORONARY ANGIOGRAPHY N/A 05/22/2018   Procedure: LEFT HEART CATH AND CORONARY ANGIOGRAPHY;  Surgeon: Wellington [place], MD;  Location: [facility];  Service: Cardiovascular;  Laterality: N/A;  . LEFT HEART CATH AND CORONARY ANGIOGRAPHY N/A 06/15/2018   Procedure: LEFT HEART CATH AND CORONARY ANGIOGRAPHY;  Surgeon: Wellington [place], MD;  Location: [facility];  Service: Cardiovascular;  Laterality: N/A;  . LYMPHADENECTOMY    . SPLENECTOMY, PARTIAL    . vulvulasectomy      Past Gynecologic History:  Menarche: age 69 Menstrual details- irregular menstrual periods Hysterectomy ~ 25 years ago History of abnormal pap: yes History of STDs: denies Sexually active: denies  OB History:  [id number] Vaginal delivery  OB History  No obstetric history on file.    Family History: Family History  Problem Relation Age of Onset  . Non-Hodgkin's lymphoma Mother   . Osteoporosis Mother   . Lupus Sister   . Heart disease Brother   . Heart attack Brother   . Heart attack Father     Social History: Social History   Socioeconomic History  . Marital status: Widowed    Spouse name: Not on file  . Number of children: 4  . Years of education: Not on file  . Highest education level: Not on file  Occupational History  . Occupation: disabled  Social Needs  . Financial resource strain: Not very hard  . Food insecurity    Worry: Never true    Inability: Never true  . Transportation needs    Medical: No    Non-medical: No  Tobacco Use  . Smoking status: Former Smoker    Packs/day: 0.50    Years: 30.00    Pack years: 15.00    Types: Cigarettes    Quit date: 12/19/2017    Years since quitting: 1.0  . Smokeless tobacco: Never Used  . Tobacco comment: quit 12/19/2017.  Substance and Sexual Activity  . Alcohol use: No  . Drug use: No  . Sexual activity: Not  Currently  Lifestyle  . Physical activity    Days per week: 7 days    Minutes per session: 30 min  . Stress: Very much  Relationships  . Social connections    Talks on phone: More than three times a week    Gets together: Patient refused    Attends religious service: Never    Active member of club or organization: No    Attends meetings of clubs or organizations: Never    Relationship status: Widowed  . Intimate partner violence    Fear of current or ex partner: No    Emotionally abused: No    Physically abused: No    Forced sexual activity: No  Other Topics Concern  . Not on file  Social History Narrative   Lives in [place] by herself.  Does not routinely exercise.    Allergies: Allergies  Allergen Reactions  .  Bee Venom Itching, Shortness Of Breath and Swelling  . Metformin And Related Shortness Of Breath and Swelling  . Darvon [Propoxyphene] Itching  . Gabapentin Swelling  . Nsaids Other (See Comments)    Ulcers   . Tramadol Hives  . Contrast Media [Iodinated Diagnostic Agents] Rash    If you benadryl, and steroids she is able to take the contrast per pt    Current Medications: Current Outpatient Medications  Medication Sig Dispense Refill  . acetaminophen (TYLENOL) 325 MG tablet Take 650 mg by mouth every 6 (six) hours as needed for mild pain.     Marland Kitchen albuterol (PROAIR HFA) 108 (90 Base) MCG/ACT inhaler Inhale 1 puff into the lungs every 6 (six) hours as needed for wheezing or shortness of breath.     Marland Kitchen aluminum sulfate-calcium acetate (DOMEBORO) packet Apply 1 packet topically 2 (two) times daily as needed.    Marland Kitchen aspirin 81 MG tablet Take 81 mg by mouth daily.    Marland Kitchen atorvastatin (LIPITOR) 80 MG tablet Take 1 tablet (80 mg total) by mouth daily at 6 PM. 90 tablet 3  . BD VEO INSULIN SYRINGE U/F 31G X 15/64" 0.5 ML MISC 4 (four) times daily. use as directed    . Blood Glucose Monitoring Suppl (GLUCOCOM BLOOD GLUCOSE MONITOR) DEVI 1 m.    . citalopram (CELEXA) 40 MG  tablet Take 1 tablet (40 mg total) by mouth daily. 90 tablet 1  . cyclobenzaprine (FLEXERIL) 5 MG tablet Take 1 tablet (5 mg total) by mouth 3 (three) times daily as needed for muscle spasms. (Patient not taking: Reported on 12/07/2018) 10 tablet 0  . esomeprazole (NEXIUM) 40 MG capsule Take 40 mg by mouth daily.     . insulin aspart (NOVOLOG) 100 UNIT/ML injection Inject 25 Units into the skin 3 (three) times daily before meals. Take 10 units PLUS sliding scale (Patient taking differently: Inject 35 Units into the skin 3 (three) times daily before meals. Take 10 units PLUS sliding scale ) 22.5 mL 1  . insulin glargine (LANTUS) 100 UNIT/ML injection Inject 0.55 mLs (55 Units total) into the skin 2 (two) times daily. (Patient taking differently: Inject 60 Units into the skin 2 (two) times daily. ) 33 mL 1  . insulin glargine (LANTUS) 100 UNIT/ML injection Inject 60 Units into the skin 2 (two) times daily.     Elmore Guise Devices (SIMPLE DIAGNOSTICS LANCING DEV) MISC AS DIRECTED    . lidocaine (XYLOCAINE) 2 % jelly Apply topically as needed.    Marland Kitchen LORazepam (ATIVAN) 1 MG tablet Take 1 tablet by mouth twice daily as needed for anxiety 60 tablet 2  . mirtazapine (REMERON) 15 MG tablet Take 15 mg by mouth at bedtime.    . nitroGLYCERIN (NITROSTAT) 0.4 MG SL tablet Place 1 tablet (0.4 mg total) under the tongue every 5 (five) minutes as needed for chest pain. 10 tablet 0  . ondansetron (ZOFRAN ODT) 4 MG disintegrating tablet Take 1 tablet (4 mg total) by mouth every 8 (eight) hours as needed. 20 tablet 0  . oxyCODONE (OXYCONTIN) 30 MG 12 hr tablet Take 1 tablet by mouth 2 (two) times a day.    . Oxycodone HCl 10 MG TABS Take 1 tablet by mouth every 4 (four) hours as needed.    . promethazine (PHENERGAN) 25 MG tablet Take 25 mg by mouth every 6 (six) hours as needed for nausea. for nausea  3  . ranolazine (RANEXA) 1000 MG SR tablet Take 1 tablet (  1,000 mg total) by mouth 2 (two) times daily. 180 tablet 3  .  silver sulfADIAZINE (SILVADENE) 1 % cream Apply topically 2 (two) times a day.    . ticagrelor (BRILINTA) 90 MG TABS tablet Take 90 mg by mouth 2 (two) times daily.     No current facility-administered medications for this visit.    Review of Systems General: Chronic fatigue and weakness Skin: Boil/abscess resolved Eyes: no complaints HEENT: no complaints Breasts: no complaints Pulmonary: no complaints Cardiac: no complaints Gastrointestinal: Chronic diarrhea, decreased appetite, nausea Genitourinary/Sexual: Problems with bladder function Ob/Gyn: Chronic vulvar pain Musculoskeletal: no complaints Hematology: On aspirin and Brilinta Neurologic/Psych: Chronic numbness and tingling   Objective:  Physical Examination:  There were no vitals taken for this visit.    ECOG Performance Status: 1 - Symptomatic but completely ambulatory  GENERAL: Patient is a well appearing female in no acute distress HEENT:  Sclera clear. Neck supple LUNGS:  Clear to auscultation bilaterally.   HEART:  Regular rate and rhythm.  ABDOMEN:  Soft, nontender.  No hernias, incisions well healed. No masses or ascites EXTREMITIES:  No peripheral edema. Atraumatic. No cyanosis NEURO:  Nonfocal. Well oriented.  Appropriate affect.  ***Pelvic: EGBUS:  vulvar with erythema diffusely suspect due to radiation skin toxicity; previously seen sharply defined velvety changes involving the bilateral labia and clitoral hood; right vulvar area adjacent to urethra, and perianal lesion now resolved. There was greenish discharge on the urethra.   ***The remainder of the exam was deferred. Exam findings from 07/05/2018 Vagina: small 5-8 mm nodules noted otherwise negative. No bleeding.  Uterus: surgically absent Cervix: surgically absent  Lab Review No labs on site today.  - HIV - nonreactive 06/13/2018  Radiologic Imaging: No imaging on site today    Assessment:  Jame Seelig is a 53 y.o. female diagnosed with  history of stage I vulvar cancer 1.3 mm invasion treated with left radical vulvectomy and bilateral groin node dissection in 05/05/2004; followed by right vulvectomy for microinvasive vulvar cancer 04/2012; MOHS surgery right groin for [facility] 10/04/2012; subsequent VIN2 at least disease on biopsy 03/2017; then lost to follow up. Exam concerning for VIN versus vulvar cancer recurrence. Vulvar Biopsy at least VIN3 and definitive invasion can not be assessed.   Poor candidate for topical imiquimod or 5FU therapy. Poor surgical candidate as anti-platelet therapy can not be discontinued due to MI.   Perianal lesion concern for malignancy, biopsy c/w high grade squamous intraepithelial lesion  S/p vulvar radiation therapy; no gross evidence of disease.   Dysuria and clinical exam findings concerning for UTI: s/p I&O negative for purulent urine.   HGSIL Vaginal Pap, negative for HRHPV types tested.   History of symptomatic vulvar candidiasis   Medical co-morbidities complicating care:History of ST elevation myocardial infarction (STEMI); CAD; type 2 DM; Chronic systolic heart failure ([place]); Ischemic cardiomyopathy; Protein-calorie malnutrition, severe; COPD. Currently on anticoagulation with ticagrelor CAD s/p NSTEMI 12/2017 stenting x 5; s/p cardiac catherization 05/22/2018 and percutaneous angioplasty of prior stent.  Anticoagulation was discussed with cardiology prior to biopsy and they advised that patient was too high risk to hold medication.    Plan:   Problem List Items Addressed This Visit      Genitourinary   Vulvar intraepithelial neoplasia (VIN) grade 3 - Primary     Follow up with Radiation Oncology in July and return to our clinic in August for repeat evaluation.   HSIL vaginal Pap with negative HRHPV testing. Follow up Pap in one  year, due in 12/20.   Beckey Rutter, DNP, AGNP-C [place] [facility] [phone] (work cell) [phone] (office)  I personally had a face  to face interaction and evaluated the patient jointly with the NP, Ms. Beckey Rutter.  I have reviewed her history and available records and have performed the key portions of the physical exam including general, HEENT, abdominal exam, pelvic exam with my findings confirming those documented above by the APP.  I have discussed the case with the APP and the patient.  I agree with the above documentation, assessment and plan which was fully formulated by me.  Counseling was completed by me.   I personally saw the patient and performed a substantive portion of this encounter in conjunction with the listed APP as documented above.  A total of 20 minutes were spent with the patient/family today; >50% was spent in education, counseling and coordination of care for ovarian cancer.  Angeles Gaetana Michaelis, MD  CC:  Olin Hauser, DO [address], [place] [address] [phone]

## 2019-02-23 ENCOUNTER — Telehealth: Payer: Self-pay | Admitting: Family Medicine

## 2019-02-23 DIAGNOSIS — Z515 Encounter for palliative care: Secondary | ICD-10-CM

## 2019-02-23 DIAGNOSIS — F418 Other specified anxiety disorders: Secondary | ICD-10-CM

## 2019-02-23 MED ORDER — LORAZEPAM 1 MG PO TABS: 1.0000 mg | ORAL_TABLET | Freq: Two times a day (BID) | ORAL | 2 refills | Status: DC | PRN

## 2019-02-23 NOTE — Telephone Encounter (Signed)
Pt  Called requesting refill on Medication LORazepam (ATIVAN) 1 MG tablet [4573]

## 2019-02-28 ENCOUNTER — Inpatient Hospital Stay: Payer: Medicaid Other | Attending: Obstetrics and Gynecology

## 2019-03-05 ENCOUNTER — Other Ambulatory Visit: Payer: Self-pay

## 2019-03-05 MED ORDER — TICAGRELOR 90 MG PO TABS: 90.0000 mg | ORAL_TABLET | Freq: Two times a day (BID) | ORAL | 0 refills | Status: DC

## 2019-03-05 MED ORDER — ATORVASTATIN CALCIUM 80 MG PO TABS: 80.0000 mg | ORAL_TABLET | Freq: Every day | ORAL | 0 refills | Status: AC

## 2019-03-07 ENCOUNTER — Telehealth: Payer: Self-pay | Admitting: Obstetrics and Gynecology

## 2019-03-07 NOTE — Telephone Encounter (Signed)
LM to try and R/S missed GYN appt

## 2019-03-12 ENCOUNTER — Ambulatory Visit: Payer: Medicaid Other | Admitting: Family Medicine

## 2019-03-13 ENCOUNTER — Other Ambulatory Visit: Payer: Self-pay

## 2019-03-13 ENCOUNTER — Other Ambulatory Visit: Payer: Self-pay | Admitting: Family Medicine

## 2019-03-13 DIAGNOSIS — IMO0002 Reserved for concepts with insufficient information to code with codable children: Secondary | ICD-10-CM

## 2019-03-13 DIAGNOSIS — E1140 Type 2 diabetes mellitus with diabetic neuropathy, unspecified: Secondary | ICD-10-CM

## 2019-03-13 MED ORDER — "BD VEO INSULIN SYRINGE U/F 31G X 15/64"" 0.5 ML MISC": 5 refills | Status: DC

## 2019-03-14 ENCOUNTER — Encounter: Payer: Self-pay | Admitting: Family Medicine

## 2019-03-14 ENCOUNTER — Other Ambulatory Visit: Payer: Self-pay

## 2019-03-14 ENCOUNTER — Ambulatory Visit (INDEPENDENT_AMBULATORY_CARE_PROVIDER_SITE_OTHER): Payer: Medicaid Other | Admitting: Family Medicine

## 2019-03-14 DIAGNOSIS — F418 Other specified anxiety disorders: Secondary | ICD-10-CM

## 2019-03-14 DIAGNOSIS — Z515 Encounter for palliative care: Secondary | ICD-10-CM | POA: Diagnosis not present

## 2019-03-14 DIAGNOSIS — Z1211 Encounter for screening for malignant neoplasm of colon: Secondary | ICD-10-CM

## 2019-03-14 MED ORDER — LORAZEPAM 1 MG PO TABS: 1.0000 mg | ORAL_TABLET | Freq: Three times a day (TID) | ORAL | 2 refills | Status: DC | PRN

## 2019-03-14 NOTE — Assessment & Plan Note (Addendum)
Recent worsening due to cancer prognosis Chronic GAD with mixed features of depression, some related to life stressors and passed family members as well as personal health decline  On palliative care, recommended to continue Lorazepam for anxiety, she is on pain management from them as well  Plan: 1. INCREASE frequency Lorazepam 1mg  now TID PRN and can take 0.5 tab overnight for breakthrough if wake up with panic 2. Continue other meds Celexa 40mg  daily and MIrtazapine 15mg  daily - Continue f/u with [facility]

## 2019-03-14 NOTE — Patient Instructions (Signed)
AVS given by phone. 

## 2019-03-14 NOTE — Progress Notes (Signed)
Virtual Visit via Telephone The purpose of this virtual visit is to provide medical care while limiting exposure to the novel coronavirus (COVID19) for both patient and office staff.  Consent was obtained for phone visit:  Yes.   Answered questions that patient had about telehealth interaction:  Yes.   I discussed the limitations, risks, security and privacy concerns of performing an evaluation and management service by telephone. I also discussed with the patient that there may be a patient responsible charge related to this service. The patient expressed understanding and agreed to proceed.  Patient Location: Home Provider Location: [facility])  ---------------------------------------------------------------------- Chief Complaint  Patient presents with  . Depression    PHQ9 11    S: Reviewed CMA documentation. I have called patient and gathered additional HPI as follows:  Follow-up Anxiety, with depression Last visit with me virtual telephone 11/2018, see note continued on Lorazepam per palliative recommendations, followed by [facility], and treatment per Oncology for Vulvar cancer. On pain management. - Now she reports stressors worsening with as cancer gets worse, her anxiety gets worse, wakes up with heart flying and shaking often, says the Lorazepam is working but not lasting long enough, has breakthrough symptoms during day and night. - Her Psychiatry Dr Jacqualine Code passed away. Now she has new Psych next month [facility] Currently on Celexa (Citalopram) 27m daily, Remeron 140mnightly, and Ativan 63m50m She is taking Ativan 63mg33mD every day,without known history of withdrawal - She says palliative care is comfortable with this treatment and they recommend to continue on it  Denies any high risk travel to areas of current concern for COVID19. Denies any known or suspected exposure to person with or possibly with COVID19.  Denies any fevers, chills, sweats,  body ache, cough, shortness of breath, sinus pain or pressure, headache, abdominal pain, diarrhea  Past Medical History:  Diagnosis Date  . Cancer (HCC)[place] vulvular  . Cervical disc disease    a. 01/2018 MRI Cervical spine: Cervical spondylosis with multilevel disc and facet degeneration greatest at C5-6 and C6-7.  Moderate to sev R C5-6, mod L C5-6, and mod bilat C6-7 foraminal stenosis with multilevel mild foraminal stenosis.  Mild C5-6 and mod C6-7 canal stenosis. C6-7 central cord impingement w/ cord flattening.  . Chronic combined systolic (congestive) and diastolic (congestive) heart failure (HCC)[place] a. 12/2017 Echo: EF 30-35%, mid-apicalanteroseptal, ant, and apical AK. Gr1 DD. Mild conc LVH; b. 03/2018 Echo: EF 45-50%, antsept, ant HK. Mild MR. Nl LA size. Nl RV fxn.  . COMarland KitchenD (chronic obstructive pulmonary disease) (HCC)    not on home oxygen  . Coronary artery disease    a. 12/2017 ACS/PCI: LAD 100p (2.25x26 Resolute Onyx DES), 33m 43mx12 Resolute Onyx DES), 80d (2.0x15 Resolute Onyx DES), RCA 90p (non-dominant). EF 25-35%. Post-MI course complicated by CGS; b. 12/2008/5397MI/subacute thrombosis-->LAD 100 (PTCA + DES x 1); c. 01/2018 NSTEMI/PCI: LM min irregs, LAD 50-60p ISR/hazy (3.5x12 Resolute DES), 43m/d18mderexpansion of prior stents-->PTCA). EF 45-50%.  . GERDMarland Kitchen(gastroesophageal reflux disease)   . H/O degenerative disc disease   . Hypotension   . Ischemic cardiomyopathy    a. 12/2017 Echo: EF 30-35%; b. 03/2018 Echo: EF 45-50%.  . Myocardial infarction (HCC)  [place]. 12/2017-->DES to LAD x 3.  . Pancreatitis   . Shingles   . Spinal stenosis   . Tobacco abuse   . Ulcer (traumatic) of oral mucosa   . Urinary incontinence   .  VIN III (vulvar intraepithelial neoplasia III)    Social History   Tobacco Use  . Smoking status: Former Smoker    Packs/day: 0.50    Years: 30.00    Pack years: 15.00    Types: Cigarettes    Quit date: 12/19/2017    Years since quitting: 1.2  . Smokeless  tobacco: Never Used  . Tobacco comment: quit 12/19/2017.  Substance Use Topics  . Alcohol use: No  . Drug use: No    Current Outpatient Medications:  .  acetaminophen (TYLENOL) 325 MG tablet, Take 650 mg by mouth every 6 (six) hours as needed for mild pain. , Disp: , Rfl:  .  albuterol (PROAIR HFA) 108 (90 Base) MCG/ACT inhaler, Inhale 1 puff into the lungs every 6 (six) hours as needed for wheezing or shortness of breath. , Disp: , Rfl:  .  amitriptyline (ELAVIL) 50 MG tablet, Take 50 mg by mouth at bedtime. [facility], Disp: , Rfl:  .  aspirin 81 MG tablet, Take 81 mg by mouth daily., Disp: , Rfl:  .  atorvastatin (LIPITOR) 80 MG tablet, Take 1 tablet (80 mg total) by mouth daily at 6 PM., Disp: 90 tablet, Rfl: 0 .  BD VEO INSULIN SYRINGE U/F 31G X 15/64" 0.5 ML MISC, use as directed 4 times daily, Disp: 300 each, Rfl: 5 .  Blood Glucose Monitoring Suppl (GLUCOCOM BLOOD GLUCOSE MONITOR) DEVI, 1 m., Disp: , Rfl:  .  citalopram (CELEXA) 40 MG tablet, Take 1 tablet (40 mg total) by mouth daily., Disp: 90 tablet, Rfl: 1 .  esomeprazole (NEXIUM) 40 MG capsule, Take 40 mg by mouth daily. , Disp: , Rfl:  .  insulin aspart (NOVOLOG) 100 UNIT/ML injection, Inject 25 Units into the skin 3 (three) times daily before meals. Take 10 units PLUS sliding scale (Patient taking differently: Inject 35 Units into the skin 3 (three) times daily before meals. Take 10 units PLUS sliding scale ), Disp: 22.5 mL, Rfl: 1 .  insulin glargine (LANTUS) 100 UNIT/ML injection, Inject 0.55 mLs (55 Units total) into the skin 2 (two) times daily. (Patient taking differently: Inject 60 Units into the skin 2 (two) times daily. ), Disp: 33 mL, Rfl: 1 .  insulin glargine (LANTUS) 100 UNIT/ML injection, Inject 60 Units into the skin 2 (two) times daily. , Disp: , Rfl:  .  Lancet Devices (SIMPLE DIAGNOSTICS LANCING DEV) MISC, AS DIRECTED, Disp: , Rfl:  .  lidocaine (XYLOCAINE) 2 % jelly, Apply topically as needed., Disp: , Rfl:   .  LORazepam (ATIVAN) 1 MG tablet, Take 1 tablet (1 mg total) by mouth 3 (three) times daily as needed for anxiety. May take extra 0.35m (half tablet) if wake up overnight, for anxiety, Disp: 105 tablet, Rfl: 2 .  methocarbamol (ROBAXIN) 500 MG tablet, Take 500 mg by mouth 4 (four) times daily., Disp: , Rfl:  .  mirtazapine (REMERON) 15 MG tablet, Take 15 mg by mouth at bedtime., Disp: , Rfl:  .  nitroGLYCERIN (NITROSTAT) 0.4 MG SL tablet, Place 1 tablet (0.4 mg total) under the tongue every 5 (five) minutes as needed for chest pain., Disp: 10 tablet, Rfl: 0 .  ondansetron (ZOFRAN ODT) 4 MG disintegrating tablet, Take 1 tablet (4 mg total) by mouth every 8 (eight) hours as needed., Disp: 20 tablet, Rfl: 0 .  oxyCODONE (OXYCONTIN) 30 MG 12 hr tablet, Take 1 tablet by mouth 3 (three) times daily. [facility], Disp: , Rfl:  .  Oxycodone  HCl 10 MG TABS, Take 2 tablets by mouth every 3 (three) hours. [facility], Disp: , Rfl:  .  promethazine (PHENERGAN) 25 MG tablet, Take 25 mg by mouth every 6 (six) hours as needed for nausea. for nausea, Disp: , Rfl: 3 .  ranolazine (RANEXA) 1000 MG SR tablet, Take 1 tablet (1,000 mg total) by mouth 2 (two) times daily., Disp: 180 tablet, Rfl: 3 .  silver sulfADIAZINE (SILVADENE) 1 % cream, Apply topically 2 (two) times a day., Disp: , Rfl:  .  ticagrelor (BRILINTA) 90 MG TABS tablet, Take 1 tablet (90 mg total) by mouth 2 (two) times daily., Disp: 60 tablet, Rfl: 0  Depression screen [facility] 2/9 03/14/2019 12/07/2018 09/04/2018  Decreased Interest _0 Down, Depressed, Hopeless _1 PHQ - 2 Score _2 Altered sleeping 0 0 3  Tired, decreased energy 0 3 3  Change in appetite _3 Feeling bad or failure about yourself  1 0 0  Trouble concentrating _4 Moving slowly or fidgety/restless 0 0 0  Suicidal thoughts 0 0 0  PHQ-9 Score _5 Difficult doing work/chores Somewhat difficult Not difficult at all Not difficult at all    GAD 7 :  Generalized Anxiety Score 12/07/2018 09/04/2018 05/31/2018 02/27/2018  Nervous, Anxious, on Edge _6 Control/stop worrying _7 Worry too much - different things _8 Trouble relaxing _9 Restless _10 Easily annoyed or irritable 0 _11 Afraid - awful might happen _12 Total GAD 7 Score _13 Anxiety Difficulty Somewhat difficult Not difficult at all Not difficult at all Somewhat difficult    -------------------------------------------------------------------------- O: No physical exam performed due to remote telephone encounter.  Lab results reviewed.  No results found for this or any previous visit (from the past 2160 hour(s)).  -------------------------------------------------------------------------- A&P:  Problem List Items Addressed This Visit    Anxiety associated with depression - Primary    Recent worsening due to cancer prognosis Chronic GAD with mixed features of depression, some related to life stressors and passed family members as well as personal health decline  On palliative care, recommended to continue Lorazepam for anxiety, she is on pain management from them as well  Plan: 1. INCREASE frequency Lorazepam 65m now TID PRN and can take 0.5 tab overnight for breakthrough if wake up with panic 2. Continue other meds Celexa 4109mdaily and MIrtazapine 1557maily - Continue f/u with [facility]      Relevant Medications   amitriptyline (ELAVIL) 50 MG tablet   LORazepam (ATIVAN) 1 MG tablet   Palliative care patient   Relevant Medications   LORazepam (ATIVAN) 1 MG tablet    Other Visit Diagnoses    Screening for colon cancer       Relevant Orders   Cologuard    Interested in updating colon cancer screening, prefer home kit testing Ordered Cologuard  Also due for DM follow-up in 4 weeks, [facility] [place] would like to come back here.  Meds ordered this encounter   Medications  . LORazepam (ATIVAN) 1 MG tablet    Sig: Take 1 tablet (1 mg total) by mouth 3 (three) times daily as needed for anxiety. May take extra 0.5mg28malf tablet) if  wake up overnight, for anxiety    Dispense:  105 tablet    Refill:  2    Follow-up: - Return in 4-6 weeks Diabetes follow-up  Patient verbalizes understanding with the above medical recommendations including the limitation of remote medical advice.  Specific follow-up and call-back criteria were given for patient to follow-up or seek medical care more urgently if needed.   - Time spent in direct consultation with patient on phone: 11 minutes   Nobie Putnam, [facility] 03/14/2019, 11:59 AM

## 2019-03-20 NOTE — Progress Notes (Signed)
Letter sent to Renee Mack regarding her missed 01/24/19 and 02/28/19 appointment with Dr. Theora Gianotti. It can be viewed under letters.

## 2019-03-22 ENCOUNTER — Other Ambulatory Visit: Payer: Self-pay

## 2019-03-22 ENCOUNTER — Telehealth: Payer: Self-pay | Admitting: Family Medicine

## 2019-03-22 DIAGNOSIS — K219 Gastro-esophageal reflux disease without esophagitis: Secondary | ICD-10-CM

## 2019-03-22 MED ORDER — ESOMEPRAZOLE MAGNESIUM 40 MG PO CPDR: 40.0000 mg | DELAYED_RELEASE_CAPSULE | Freq: Every day | ORAL | 0 refills | Status: DC

## 2019-03-22 NOTE — Telephone Encounter (Signed)
Pt called requesting refill on  Nexium  40 mg

## 2019-03-26 LAB — COLOGUARD: Cologuard: NEGATIVE

## 2019-04-26 ENCOUNTER — Ambulatory Visit (INDEPENDENT_AMBULATORY_CARE_PROVIDER_SITE_OTHER): Payer: Medicaid Other | Admitting: Family Medicine

## 2019-04-26 ENCOUNTER — Other Ambulatory Visit: Payer: Self-pay

## 2019-04-26 ENCOUNTER — Encounter: Payer: Self-pay | Admitting: Family Medicine

## 2019-04-26 VITALS — BP 138/63 | HR 77 | Temp 99.0°F | Resp 16 | Ht 61.0 in | Wt 141.6 lb

## 2019-04-26 DIAGNOSIS — E1140 Type 2 diabetes mellitus with diabetic neuropathy, unspecified: Secondary | ICD-10-CM | POA: Diagnosis not present

## 2019-04-26 DIAGNOSIS — J432 Centrilobular emphysema: Secondary | ICD-10-CM | POA: Diagnosis not present

## 2019-04-26 DIAGNOSIS — K219 Gastro-esophageal reflux disease without esophagitis: Secondary | ICD-10-CM | POA: Diagnosis not present

## 2019-04-26 DIAGNOSIS — E1165 Type 2 diabetes mellitus with hyperglycemia: Secondary | ICD-10-CM | POA: Diagnosis not present

## 2019-04-26 DIAGNOSIS — IMO0002 Reserved for concepts with insufficient information to code with codable children: Secondary | ICD-10-CM

## 2019-04-26 DIAGNOSIS — Z515 Encounter for palliative care: Secondary | ICD-10-CM | POA: Diagnosis not present

## 2019-04-26 DIAGNOSIS — F418 Other specified anxiety disorders: Secondary | ICD-10-CM

## 2019-04-26 LAB — POCT UA - MICROALBUMIN: Microalbumin Ur, POC: 20 mg/L

## 2019-04-26 LAB — POCT GLYCOSYLATED HEMOGLOBIN (HGB A1C): Hemoglobin A1C: 14 % — AB (ref 4.0–5.6)

## 2019-04-26 MED ORDER — ESOMEPRAZOLE MAGNESIUM 40 MG PO CPDR: 40.0000 mg | DELAYED_RELEASE_CAPSULE | Freq: Every day | ORAL | 1 refills | Status: DC

## 2019-04-26 MED ORDER — BENZONATATE 100 MG PO CAPS: 100.0000 mg | ORAL_CAPSULE | Freq: Three times a day (TID) | ORAL | 1 refills | Status: DC | PRN

## 2019-04-26 NOTE — Assessment & Plan Note (Signed)
Controlled Refill PPI Esomeprazole

## 2019-04-26 NOTE — Assessment & Plan Note (Signed)
Uncontrolled DM with hyperglycemia, [id number] Complicated by cancer treatments Complications - peripheral neuropathy, other including hyperlipidemia, GERD, depression, vascular disease with CAD - increases risk of future cardiovascular complications   Plan:  1. Continue current therapy - Lantus 60 BIDand Novolog TID SSI - increase from 35 up by +2 every 1 week as needed for fasting CBG >150 in AM - She can return to [facility] given still inadequate controlled DM and has not followed back up with them lately due to [place] concerns. 2. Encourage improved lifestyle - low carb, low sugar diet, reduce portion size, continue improving regular exercise 3. Check CBG, bring log to next visit for review 4. Continue ASA, Statin - cannot tolerate ACEi/ARB due to BP/CHF - check urine microalbumin negative 5. DM Foot exam done today / Advised to schedule DM ophtho exam, send record handout given

## 2019-04-26 NOTE — Progress Notes (Signed)
Subjective:    Patient ID: Renee Mack, female    DOB: [birth year], 53 y.o.   MRN: [id number]  Renee Mack is a 52 y.o. female presenting on 04/26/2019 for Diabetes   HPI   CHRONIC DM, Type 2 with complications neuropathy Reports concern high sugar due to cancer treatments, stress among other factors Has not seen [facility] recently due to COVID restrictions, but plans to call them CBGs: Avg 200-300, no low, high >350 Meds: Lantus insulin 60u BID, Novolog TID wc 35u, with SSI Reports good compliance. Tolerating well w/o side-effects Currently  Not on acei / arb Neuropathy in feet - improved on elavil Denies hypoglycemia, polyuria, visual changes, numbness or tingling.  Anxiety / Depression / Palliative Care Patient / Vulvar Cancer Chronic Pain followed by [facility], and treatment per Oncology for Vulvar cancer. On pain management - Her Psychiatry Renee Mack passed away. Now she has new Psych end of this month [facility] 11/20 for initial telemedicine visit Currently on Celexa (Citalopram)40mg  daily, Remeron 15mg  nightly, and Lorazepam 1mg  TID PRN and additional 0.5mg  overnight if breakthrough needed Palliative agrees and recommends the increase BDZ from last visit She is doing better on this regimen.  Centrilobular Emphysema COPD Doing well, no recent flare, occasional cough needs refill benzonatate PRN  GERD Controlled on PPI Request refill esomeprazole  Health Maintenance:  Cologuard negative 03/26/19.  Depression screen [facility] 2/9 04/26/2019 03/14/2019 12/07/2018  Decreased Interest 2 3 2   Down, Depressed, Hopeless 2 3 2   PHQ - 2 Score 4 6 4   Altered sleeping 2 0 0  Tired, decreased energy 3 0 3  Change in appetite 2 1 3   Feeling bad or failure about yourself  0 1 0  Trouble concentrating 1 3 1   Moving slowly or fidgety/restless 2 0 0  Suicidal thoughts 0 0 0  PHQ-9 Score 14 11 11   Difficult doing work/chores Very difficult Somewhat difficult Not  difficult at all   GAD 7 : Generalized Anxiety Score 04/26/2019 12/07/2018 09/04/2018 05/31/2018  Nervous, Anxious, on Edge 3 2 3 2   Control/stop worrying 3 1 3 2   Worry too much - different things 2 1 3 2   Trouble relaxing 2 2 3 2   Restless 0 1 3 1   Easily annoyed or irritable 1 0 2 1  Afraid - awful might happen 1 1 2 1   Total GAD 7 Score 12 8 19 11   Anxiety Difficulty Somewhat difficult Somewhat difficult Not difficult at all Not difficult at all     Social History   Tobacco Use  . Smoking status: Former Smoker    Packs/day: 0.50    Years: 30.00    Pack years: 15.00    Types: Cigarettes    Quit date: 12/19/2017    Years since quitting: 1.3  . Smokeless tobacco: Former Systems developer  . Tobacco comment: quit 12/19/2017.  Substance Use Topics  . Alcohol use: No  . Drug use: No    Review of Systems Per HPI unless specifically indicated above     Objective:    BP 138/63   Pulse 77   Temp 99 F (37.2 C) (Oral)   Resp 16   Ht 5\' 1"  (1.549 m)   Wt 141 lb 9.6 oz (64.2 kg)   BMI 26.76 kg/m   Wt Readings from Last 3 Encounters:  04/26/19 141 lb 9.6 oz (64.2 kg)  11/22/18 135 lb 6.4 oz (61.4 kg)  10/27/18 140 lb (63.5 kg)    Physical  Exam Vitals signs and nursing note reviewed.  Constitutional:      General: She is not in acute distress.    Appearance: She is well-developed. She is not diaphoretic.     Comments: Chronically ill appearing 53 year old female, cooperative  HENT:     Head: Normocephalic and atraumatic.  Eyes:     General:        Right eye: No discharge.        Left eye: No discharge.     Conjunctiva/sclera: Conjunctivae normal.  Cardiovascular:     Rate and Rhythm: Normal rate and regular rhythm.     Pulses: Normal pulses.     Heart sounds: Normal heart sounds. No murmur.  Pulmonary:     Effort: Pulmonary effort is normal.     Breath sounds: No wheezing or rales.  Skin:    General: Skin is warm and dry.     Findings: No erythema or rash.  Neurological:      Mental Status: She is alert and oriented to person, place, and time.  Psychiatric:        Behavior: Behavior normal.     Comments: Well groomed, good eye contact, normal speech and thoughts      Diabetic Foot Exam - Simple   Simple Foot Form Diabetic Foot exam was performed with the following findings: Yes 04/26/2019 11:28 AM  Visual Inspection See comments: Yes Sensation Testing See comments: Yes Pulse Check Posterior Tibialis and Dorsalis pulse intact bilaterally: Yes Comments Bilateral feet with reduced sensation monofilament toes great toe, intact dorsal and mid and hindfoot. Some dry skin dermatitis. Callus formation but no ulceration.      Results for orders placed or performed in visit on 04/26/19  Cologuard  Result Value Ref Range   Cologuard Negative Negative  [facility] - POCT UA - Microalbumin  Result Value Ref Range   Microalbumin Ur, POC 20 mg/L  [facility] - Hemoglobin A1c POCT (Hb A1C)  Result Value Ref Range   Hemoglobin A1C 14.0 (A) 4.0 - 5.6 %      Assessment & Plan:   Problem List Items Addressed This Visit    Uncontrolled type 2 diabetes with neuropathy ([name]) - Primary    Uncontrolled DM with hyperglycemia, 123456 [id number] Complicated by cancer treatments Complications - peripheral neuropathy, other including hyperlipidemia, GERD, depression, vascular disease with CAD - increases risk of future cardiovascular complications   Plan:  1. Continue current therapy - Lantus 60 BIDand Novolog TID SSI - increase from 35 up by +2 every 1 week as needed for fasting CBG >150 in AM - She can return to [facility] given still inadequate controlled DM and has not followed back up with them lately due to [place] concerns. 2. Encourage improved lifestyle - low carb, low sugar diet, reduce portion size, continue improving regular exercise 3. Check CBG, bring log to next visit for review 4. Continue ASA, Statin - cannot tolerate ACEi/ARB due to BP/CHF - check urine microalbumin  negative 5. DM Foot exam done today / Advised to schedule DM ophtho exam, send record handout given      Relevant Orders   [facility] - POCT UA - Microalbumin (Completed)   [place] [facility] - Hemoglobin A1c POCT (Hb A1C) (Completed)   Palliative care patient    For Vulvar cancer, [facility] S/p palliative radiation treatment per oncology -On opiate pain management  Agree to continue BDZ Ativan, see a&p anxiety  She will return to Psychiatry soon [facility]  GERD (gastroesophageal reflux disease) (Chronic)    Controlled Refill PPI Esomeprazole      Relevant Medications   esomeprazole (NEXIUM) 40 MG capsule   Centrilobular emphysema (HCC)    Stable chronic problem Secondary to tobacco abuse, smoking - now former smoker Needs refill benzonatate PRN On albuterol PRN      Relevant Medications   benzonatate (TESSALON) 100 MG capsule   Anxiety associated with depression    Stable to improved Chronic GAD with mixed features of depression, some related to life stressors and passed family members as well as personal health decline On palliative care, recommended to continue Lorazepam for anxiety, she is on pain management from them as well  Plan: 1. Continue current Lorazepam 1mg  now TID PRN and can take 0.5 tab overnight for breakthrough if wake up with panic 2. Continue other meds Celexa 40mg  daily and MIrtazapine 15mg  daily - Continue f/u with [facility] now 11/20 new provider            Meds ordered this encounter  Medications  . esomeprazole (NEXIUM) 40 MG capsule    Sig: Take 1 capsule (40 mg total) by mouth daily.    Dispense:  90 capsule    Refill:  1  . benzonatate (TESSALON) 100 MG capsule    Sig: Take 1 capsule (100 mg total) by mouth 3 (three) times daily as needed for cough.    Dispense:  60 capsule    Refill:  1    Follow up plan: Return in about 3 months (around 07/27/2019) for 4 month DM / Anxiety / Palliatiave.   Nobie Putnam, [facility] 04/26/2019, 11:15 AM

## 2019-04-26 NOTE — Assessment & Plan Note (Signed)
For Vulvar cancer, [facility] S/p palliative radiation treatment per oncology -On opiate pain management  Agree to continue BDZ Ativan, see a&p anxiety  She will return to Psychiatry soon [facility]

## 2019-04-26 NOTE — Assessment & Plan Note (Signed)
Stable chronic problem Secondary to tobacco abuse, smoking - now former smoker Needs refill benzonatate PRN On albuterol PRN

## 2019-04-26 NOTE — Patient Instructions (Addendum)
Thank you for coming to the office today.  Increase Novolog dose as advised. Up to 37 with meals 3 times daily, can gradually increase up to 40 and also adjust Sliding Scale insulin  Increase if fasting sugar in morning is >150 consistently  Continue Lantus 60u twice a day  We are cautious adding other new medicine due to side effects and tolerance.  Call [place] if needed if not improving sugar.  Cologuard NEGATIVE  REfilled Esomeprazole / Tessalon perls  Please schedule a Follow-up Appointment to: Return in about 3 months (around 07/27/2019) for 4 month DM / Anxiety / Palliatiave.  If you have any other questions or concerns, please feel free to call the office or send a message through [facility]. You may also schedule an earlier appointment if necessary.  Additionally, you may be receiving a survey about your experience at our office within a few days to 1 week by e-mail or mail. We value your feedback.  Nobie Putnam, DO [place]

## 2019-04-26 NOTE — Assessment & Plan Note (Signed)
Stable to improved Chronic GAD with mixed features of depression, some related to life stressors and passed family members as well as personal health decline On palliative care, recommended to continue Lorazepam for anxiety, she is on pain management from them as well  Plan: 1. Continue current Lorazepam 1mg  now TID PRN and can take 0.5 tab overnight for breakthrough if wake up with panic 2. Continue other meds Celexa 40mg  daily and MIrtazapine 15mg  daily - Continue f/u with [facility] now 11/20 new provider

## 2019-05-14 ENCOUNTER — Telehealth: Payer: Self-pay

## 2019-05-14 MED ORDER — TICAGRELOR 90 MG PO TABS: 90.0000 mg | ORAL_TABLET | Freq: Two times a day (BID) | ORAL | 0 refills | Status: AC

## 2019-05-14 NOTE — Telephone Encounter (Signed)
Left voicemail message requesting to have patient call back to schedule appointment.  

## 2019-05-14 NOTE — Telephone Encounter (Signed)
Requested Prescriptions   Signed Prescriptions Disp Refills  . ticagrelor (BRILINTA) 90 MG TABS tablet 60 tablet 0    Sig: Take 1 tablet (90 mg total) by mouth 2 (two) times daily. *NEEDS OFFICE VISIT FOR FURTHER REFILLS-PLEASE CALL [phone] TO SCHEDULE.*    Authorizing Provider: Theora Gianotti    Ordering User: Raelene Bott, BRANDY L

## 2019-05-14 NOTE — Telephone Encounter (Signed)
Call attempted to schedule overdue F/U appointment for patient.

## 2019-06-18 ENCOUNTER — Other Ambulatory Visit: Payer: Self-pay | Admitting: Family Medicine

## 2019-06-18 DIAGNOSIS — F418 Other specified anxiety disorders: Secondary | ICD-10-CM

## 2019-06-18 DIAGNOSIS — Z515 Encounter for palliative care: Secondary | ICD-10-CM

## 2019-06-18 MED ORDER — LORAZEPAM 1 MG PO TABS: 1.0000 mg | ORAL_TABLET | Freq: Three times a day (TID) | ORAL | 2 refills | Status: DC | PRN

## 2019-06-18 MED ORDER — CITALOPRAM HYDROBROMIDE 40 MG PO TABS: 40.0000 mg | ORAL_TABLET | Freq: Every day | ORAL | 1 refills | Status: DC

## 2019-06-18 NOTE — Telephone Encounter (Signed)
Pt. called requesting refill on Clexa mg 40 mg,  Ativan 1 mg

## 2019-07-27 ENCOUNTER — Encounter: Payer: Self-pay | Admitting: Family Medicine

## 2019-07-27 ENCOUNTER — Ambulatory Visit (INDEPENDENT_AMBULATORY_CARE_PROVIDER_SITE_OTHER): Payer: Medicare Other | Admitting: Family Medicine

## 2019-07-27 ENCOUNTER — Other Ambulatory Visit: Payer: Self-pay

## 2019-07-27 VITALS — Temp 98.9°F

## 2019-07-27 DIAGNOSIS — F418 Other specified anxiety disorders: Secondary | ICD-10-CM | POA: Diagnosis not present

## 2019-07-27 DIAGNOSIS — IMO0002 Reserved for concepts with insufficient information to code with codable children: Secondary | ICD-10-CM

## 2019-07-27 DIAGNOSIS — J432 Centrilobular emphysema: Secondary | ICD-10-CM | POA: Diagnosis not present

## 2019-07-27 DIAGNOSIS — Z515 Encounter for palliative care: Secondary | ICD-10-CM

## 2019-07-27 DIAGNOSIS — C519 Malignant neoplasm of vulva, unspecified: Secondary | ICD-10-CM

## 2019-07-27 DIAGNOSIS — E43 Unspecified severe protein-calorie malnutrition: Secondary | ICD-10-CM

## 2019-07-27 DIAGNOSIS — E1140 Type 2 diabetes mellitus with diabetic neuropathy, unspecified: Secondary | ICD-10-CM

## 2019-07-27 DIAGNOSIS — E1165 Type 2 diabetes mellitus with hyperglycemia: Secondary | ICD-10-CM

## 2019-07-27 NOTE — Progress Notes (Signed)
Virtual Visit via Telephone The purpose of this virtual visit is to provide medical care while limiting exposure to the novel coronavirus (COVID19) for both patient and office staff.  Consent was obtained for phone visit:  Yes.   Answered questions that patient had about telehealth interaction:  Yes.   I discussed the limitations, risks, security and privacy concerns of performing an evaluation and management service by telephone. I also discussed with the patient that there may be a patient responsible charge related to this service. The patient expressed understanding and agreed to proceed.  Patient Location: Home Provider Location: [facility])  ---------------------------------------------------------------------- Chief Complaint  Patient presents with  . Anxiety  . Diabetes    S: Reviewed CMA documentation. I have called patient and gathered additional HPI as follows:  CHRONIC DM, Type 2 with complications neuropathy Reports concern high sugar due to cancer treatments, and pain raising her sugar stress among other factors Has not seen [facility] recently due to [place] restrictions, but plans to call them soon she says she was waiting for vaccine or spring/summer 2021 CBGs: Avg 200-300, no low, high >350 Meds: Lantus insulin 60u BID, Novolog TID wc 35u, with SSI Reports good compliance. Tolerating well w/o side-effects Currently  Not on acei / arb Neuropathy in feet - improved on elavil Denies hypoglycemia, polyuria, visual changes, numbness or tingling.  Anxiety / Depression / Palliative Care Patient / Vulvar Cancer Chronic Pain [facility] by [facility] Junius Finner NP On Oxycontin 60mg  12 hr and Oxycodone 10 PRN Treatment per Oncology for Vulvar cancer. On pain management [facility] Currently on Celexa (Citalopram)40mg  daily, Remeron 15mg  nightly, and Lorazepam 1mg  TID PRN and additional 0.5mg  overnight if breakthrough  needed Palliative continues to recommend the current dose BDZ She is doing better on this regimen.  Centrilobular Emphysema COPD Doing well, no recent flare, occasional cough but no worsening dyspnea.  Denies any high risk travel to areas of current concern for COVID19. Denies any known or suspected exposure to person with or possibly with COVID19.  Denies any fevers, chills, sweats, body ache, shortness of breath, sinus pain or pressure, headache, abdominal pain, diarrhea  Past Medical History:  Diagnosis Date  . Cancer ([place])    vulvular  . Cervical disc disease    a. 01/2018 MRI Cervical spine: Cervical spondylosis with multilevel disc and facet degeneration greatest at C5-6 and C6-7.  Moderate to sev R C5-6, mod L C5-6, and mod bilat C6-7 foraminal stenosis with multilevel mild foraminal stenosis.  Mild C5-6 and mod C6-7 canal stenosis. C6-7 central cord impingement w/ cord flattening.  . Chronic combined systolic (congestive) and diastolic (congestive) heart failure ([place])    a. 12/2017 Echo: EF 30-35%, mid-apicalanteroseptal, ant, and apical AK. Gr1 DD. Mild conc LVH; b. 03/2018 Echo: EF 45-50%, antsept, ant HK. Mild MR. Nl LA size. Nl RV fxn.  Marland Kitchen COPD (chronic obstructive pulmonary disease) (HCC)    not on home oxygen  . Coronary artery disease    a. 12/2017 ACS/PCI: LAD 100p (2.25x26 Resolute Onyx DES), 37m (2.0x12 Resolute Onyx DES), 80d (2.0x15 Resolute Onyx DES), RCA 90p (non-dominant). EF 25-35%. Post-MI course complicated by CGS; b. [id number] NSTEMI/subacute thrombosis-->LAD 100 (PTCA + DES x 1); c. 01/2018 NSTEMI/PCI: LM min irregs, LAD 50-60p ISR/hazy (3.5x12 Resolute DES), 71m/d (underexpansion of prior stents-->PTCA). EF 45-50%.  Marland Kitchen GERD (gastroesophageal reflux disease)   . H/O degenerative disc disease   . Hypotension   . Ischemic cardiomyopathy  a. 12/2017 Echo: EF 30-35%; b. 03/2018 Echo: EF 45-50%.  . Myocardial infarction ([place])    a. 12/2017-->DES to LAD x 3.  .  Pancreatitis   . Shingles   . Spinal stenosis   . Tobacco abuse   . Ulcer (traumatic) of oral mucosa   . Urinary incontinence   . VIN III (vulvar intraepithelial neoplasia III)    Social History   Tobacco Use  . Smoking status: Former Smoker    Packs/day: 0.50    Years: 30.00    Pack years: 15.00    Types: Cigarettes    Quit date: 12/19/2017    Years since quitting: 1.6  . Smokeless tobacco: Former Systems developer  . Tobacco comment: quit 12/19/2017.  Substance Use Topics  . Alcohol use: No  . Drug use: No    Current Outpatient Medications:  .  acetaminophen (TYLENOL) 325 MG tablet, Take 650 mg by mouth every 6 (six) hours as needed for mild pain. , Disp: , Rfl:  .  albuterol (PROAIR HFA) 108 (90 Base) MCG/ACT inhaler, Inhale 1 puff into the lungs every 6 (six) hours as needed for wheezing or shortness of breath. , Disp: , Rfl:  .  amitriptyline (ELAVIL) 50 MG tablet, Take 75 mg by mouth at bedtime. [facility] , Disp: , Rfl:  .  aspirin 81 MG tablet, Take 81 mg by mouth daily., Disp: , Rfl:  .  BD VEO INSULIN SYRINGE U/F 31G X 15/64" 0.5 ML MISC, use as directed 4 times daily, Disp: 300 each, Rfl: 5 .  Blood Glucose Monitoring Suppl (GLUCOCOM BLOOD GLUCOSE MONITOR) DEVI, 1 m., Disp: , Rfl:  .  citalopram (CELEXA) 40 MG tablet, Take 1 tablet (40 mg total) by mouth daily., Disp: 90 tablet, Rfl: 1 .  esomeprazole (NEXIUM) 40 MG capsule, Take 1 capsule (40 mg total) by mouth daily., Disp: 90 capsule, Rfl: 1 .  insulin glargine (LANTUS) 100 UNIT/ML injection, Inject 60 Units into the skin 2 (two) times daily. , Disp: , Rfl:  .  Lancet Devices (SIMPLE DIAGNOSTICS LANCING DEV) MISC, AS DIRECTED, Disp: , Rfl:  .  lidocaine (XYLOCAINE) 2 % jelly, Apply topically as needed., Disp: , Rfl:  .  LORazepam (ATIVAN) 1 MG tablet, Take 1 tablet (1 mg total) by mouth 3 (three) times daily as needed for anxiety. May take extra 0.5mg  (half tablet) if wake up overnight, for anxiety, Disp: 105 tablet, Rfl: 2 .   methocarbamol (ROBAXIN) 500 MG tablet, Take 500 mg by mouth 4 (four) times daily., Disp: , Rfl:  .  mirtazapine (REMERON) 15 MG tablet, Take 15 mg by mouth at bedtime., Disp: , Rfl:  .  nitroGLYCERIN (NITROSTAT) 0.4 MG SL tablet, Place 1 tablet (0.4 mg total) under the tongue every 5 (five) minutes as needed for chest pain., Disp: 10 tablet, Rfl: 0 .  oxyCODONE (OXYCONTIN) 60 MG 12 hr tablet, Take by mouth., Disp: , Rfl:  .  Oxycodone HCl 10 MG TABS, Take 2 tablets by mouth every 3 (three) hours. [facility], Disp: , Rfl:  .  polyethylene glycol powder (GLYCOLAX/MIRALAX) 17 GM/SCOOP powder, Take by mouth., Disp: , Rfl:  .  promethazine (PHENERGAN) 25 MG tablet, Take 25 mg by mouth every 6 (six) hours as needed for nausea. for nausea, Disp: , Rfl: 3 .  ranolazine (RANEXA) 1000 MG SR tablet, Take 1 tablet (1,000 mg total) by mouth 2 (two) times daily., Disp: 180 tablet, Rfl: 3 .  silver sulfADIAZINE (SILVADENE) 1 %  cream, Apply topically 2 (two) times a day., Disp: , Rfl:  .  ticagrelor (BRILINTA) 90 MG TABS tablet, Take 1 tablet (90 mg total) by mouth 2 (two) times daily. *NEEDS OFFICE VISIT FOR FURTHER REFILLS-PLEASE CALL [phone] TO SCHEDULE.*, Disp: 60 tablet, Rfl: 0 .  atorvastatin (LIPITOR) 80 MG tablet, Take 1 tablet (80 mg total) by mouth daily at 6 PM., Disp: 90 tablet, Rfl: 0 .  benzonatate (TESSALON) 100 MG capsule, Take 1 capsule (100 mg total) by mouth 3 (three) times daily as needed for cough. (Patient not taking: Reported on 07/27/2019), Disp: 60 capsule, Rfl: 1 .  insulin aspart (NOVOLOG) 100 UNIT/ML injection, Inject 25 Units into the skin 3 (three) times daily before meals. Take 10 units PLUS sliding scale (Patient taking differently: Inject 35 Units into the skin 3 (three) times daily before meals. Take 10 units PLUS sliding scale ), Disp: 22.5 mL, Rfl: 1  Depression screen [facility] 2/9 07/27/2019 04/26/2019 03/14/2019  Decreased Interest 0 2 3  Down, Depressed, Hopeless 1 2 3   PHQ - 2  Score 1 4 6   Altered sleeping 0 2 0  Tired, decreased energy 3 3 0  Change in appetite 1 2 1   Feeling bad or failure about yourself  0 0 1  Trouble concentrating 0 1 3  Moving slowly or fidgety/restless 0 2 0  Suicidal thoughts 0 0 0  PHQ-9 Score 5 14 11   Difficult doing work/chores Not difficult at all Very difficult Somewhat difficult    GAD 7 : Generalized Anxiety Score 04/26/2019 12/07/2018 09/04/2018 05/31/2018  Nervous, Anxious, on Edge 3 2 3 2   Control/stop worrying 3 1 3 2   Worry too much - different things 2 1 3 2   Trouble relaxing 2 2 3 2   Restless 0 1 3 1   Easily annoyed or irritable 1 0 2 1  Afraid - awful might happen 1 1 2 1   Total GAD 7 Score 12 8 19 11   Anxiety Difficulty Somewhat difficult Somewhat difficult Not difficult at all Not difficult at all    -------------------------------------------------------------------------- O: No physical exam performed due to remote telephone encounter.  Lab results reviewed.  No results found for this or any previous visit (from the past 2160 hour(s)).  -------------------------------------------------------------------------- A&P:  Problem List Items Addressed This Visit    Vulvar cancer ([place])   Uncontrolled type 2 diabetes with neuropathy ([place])    Uncontrolled DM with hyperglycemia, A1c >14 previously, did not have repeat A1c now due to remote visit - has not returned to Endocrinology Complicated by cancer treatments Complications - peripheral neuropathy, other including hyperlipidemia, GERD, depression, vascular disease with CAD - increases risk of future cardiovascular complications   Plan:  1. Continue current therapy - Lantus 60 BIDand Novolog TID SSI - increase from 35 up by +2 every 1 week as needed for fasting CBG >150 in AM - as she did not increase prior - She can return to [facility] given still inadequate controlled DM and has not followed back up with them lately due to [place] concerns. - she will call  them now 2. Encourage improved lifestyle - low carb, low sugar diet, reduce portion size, continue improving regular exercise 3. Check CBG, bring log to next visit for review 4. Continue ASA, Statin - cannot tolerate ACEi/ARB due to BP/CHF      Protein-calorie malnutrition, severe   Palliative care patient   Centrilobular emphysema (HCC)    Stable chronic problem Secondary to tobacco abuse, smoking -  now former smoker On albuterol PRN      Anxiety associated with depression - Primary    Stable to improved on higher dose BDZ now and seeing [facility]  Chronic GAD with mixed features of depression, some related to life stressors and passed family members as well as personal health decline On palliative care, recommended to continue Lorazepam for anxiety, she is on pain management from them as well  Checked PDMP  Plan: 1. Continue current Lorazepam 1mg  now TID PRN and can take 0.5 tab overnight for breakthrough if wake up with panic - has 1 more refill, then can re order when request for 3 month 2. Continue other meds Celexa 40mg  daily and MIrtazapine 15mg  daily  - Continue f/u with [facility]        For Vulvar cancer, [facility] S/p palliative radiation treatment per oncology -On opiate pain management  Agree to continue BDZ Ativan, see a&p anxiety [facility]  No orders of the defined types were placed in this encounter.   Follow-up: - Return in 4 months for med refills  Patient verbalizes understanding with the above medical recommendations including the limitation of remote medical advice.  Specific follow-up and call-back criteria were given for patient to follow-up or seek medical care more urgently if needed.   - Time spent in direct consultation with patient on phone: 7 minutes   Nobie Putnam, [facility] 07/27/2019, 11:05 AM

## 2019-07-27 NOTE — Assessment & Plan Note (Signed)
Stable to improved on higher dose BDZ now and seeing [facility]  Chronic GAD with mixed features of depression, some related to life stressors and passed family members as well as personal health decline On palliative care, recommended to continue Lorazepam for anxiety, she is on pain management from them as well  Checked PDMP  Plan: 1. Continue current Lorazepam 1mg  now TID PRN and can take 0.5 tab overnight for breakthrough if wake up with panic - has 1 more refill, then can re order when request for 3 month 2. Continue other meds Celexa 40mg  daily and MIrtazapine 15mg  daily  - Continue f/u with [facility]

## 2019-07-27 NOTE — Assessment & Plan Note (Signed)
Stable chronic problem Secondary to tobacco abuse, smoking - now former smoker On albuterol PRN

## 2019-07-27 NOTE — Assessment & Plan Note (Signed)
Uncontrolled DM with hyperglycemia, A1c >14 previously, did not have repeat A1c now due to remote visit - has not returned to Endocrinology Complicated by cancer treatments Complications - peripheral neuropathy, other including hyperlipidemia, GERD, depression, vascular disease with CAD - increases risk of future cardiovascular complications   Plan:  1. Continue current therapy - Lantus 60 BIDand Novolog TID SSI - increase from 35 up by +2 every 1 week as needed for fasting CBG >150 in AM - as she did not increase prior - She can return to [name]ology given still inadequate controlled DM and has not followed back up with them lately due to [place] concerns. - she will call them now 2. Encourage improved lifestyle - low carb, low sugar diet, reduce portion size, continue improving regular exercise 3. Check CBG, bring log to next visit for review 4. Continue ASA, Statin - cannot tolerate ACEi/ARB due to BP/CHF

## 2019-09-14 ENCOUNTER — Other Ambulatory Visit: Payer: Self-pay

## 2019-09-14 ENCOUNTER — Telehealth: Payer: Self-pay | Admitting: Family Medicine

## 2019-09-14 DIAGNOSIS — Z515 Encounter for palliative care: Secondary | ICD-10-CM

## 2019-09-14 DIAGNOSIS — F418 Other specified anxiety disorders: Secondary | ICD-10-CM

## 2019-09-14 MED ORDER — LORAZEPAM 1 MG PO TABS: 1.0000 mg | ORAL_TABLET | Freq: Three times a day (TID) | ORAL | 2 refills | Status: DC | PRN

## 2019-09-14 NOTE — Telephone Encounter (Signed)
Pt is requesting refill on lorazepam 1 MG called into [facility]

## 2019-10-05 ENCOUNTER — Other Ambulatory Visit: Payer: Self-pay | Admitting: Family Medicine

## 2019-10-05 DIAGNOSIS — J432 Centrilobular emphysema: Secondary | ICD-10-CM

## 2019-10-15 ENCOUNTER — Other Ambulatory Visit: Payer: Self-pay | Admitting: Family Medicine

## 2019-10-15 DIAGNOSIS — K219 Gastro-esophageal reflux disease without esophagitis: Secondary | ICD-10-CM

## 2019-10-15 NOTE — Telephone Encounter (Signed)
Requested Prescriptions  Pending Prescriptions Disp Refills  . esomeprazole (NEXIUM) 40 MG capsule [Pharmacy Med Name: Esomeprazole Magnesium 40 MG Oral Capsule Delayed Release] 90 capsule 0    Sig: Take 1 capsule by mouth once daily     Gastroenterology: Proton Pump Inhibitors Passed - 10/15/2019  9:48 AM      Passed - Valid encounter within last 12 months    Recent Outpatient Visits          2 months ago Anxiety associated with depression   [place], DO   5 months ago Uncontrolled type 2 diabetes with neuropathy [facility])   [place], DO   7 months ago Anxiety associated with depression   [place], DO   10 months ago Anxiety associated with depression   [place], DO   10 months ago Anxiety associated with depression   [place], DO

## 2019-11-20 ENCOUNTER — Ambulatory Visit (INDEPENDENT_AMBULATORY_CARE_PROVIDER_SITE_OTHER): Payer: Medicare Other

## 2019-11-20 VITALS — Ht 61.0 in | Wt 141.0 lb

## 2019-11-20 DIAGNOSIS — Z0000 Encounter for general adult medical examination without abnormal findings: Secondary | ICD-10-CM | POA: Diagnosis not present

## 2019-11-20 NOTE — Patient Instructions (Signed)
Renee Mack , Thank you for taking time to come for your Medicare Wellness Visit. I appreciate your ongoing commitment to your health goals. Please review the following plan we discussed and let me know if I can assist you in the future.   Screening recommendations/referrals: Colonoscopy: Cologuard completed Mammogram: Declined Bone Density: Not indicated Recommended yearly ophthalmology/optometry visit for glaucoma screening and checkup Recommended yearly dental visit for hygiene and checkup  Vaccinations: Influenza vaccine: Due 02/2020 Pneumococcal vaccine: Completed Tdap vaccine: Due. Please discuss with pharmacist Shingles vaccine: Due. Please discuss with pharmacist  Covid-19: Declined  Advanced directives: Will pick up at next office visit  Conditions/risks identified: Diabetes, please have your diabetic eye eam sent to our office.  Next appointment: Follow up in 1 year for your annual wellness visit.  Preventive Care 40-64 Years, Female Preventive care refers to lifestyle choices and visits with your health care provider that can promote health and wellness. What does preventive care include?  A yearly physical exam. This is also called an annual well check.  Dental exams once or twice a year.  Routine eye exams. Ask your health care provider how often you should have your eyes checked.  Personal lifestyle choices, including:  Daily care of your teeth and gums.  Regular physical activity.  Eating a healthy diet.  Avoiding tobacco and drug use.  Limiting alcohol use.  Practicing safe sex.  Taking low-dose aspirin daily starting at age 56.  Taking vitamin and mineral supplements as recommended by your health care provider. What happens during an annual well check? The services and screenings done by your health care provider during your annual well check will depend on your age, overall health, lifestyle risk factors, and family history of disease. Counseling   Your health care provider may ask you questions about your:  Alcohol use.  Tobacco use.  Drug use.  Emotional well-being.  Home and relationship well-being.  Sexual activity.  Eating habits.  Work and work Statistician.  Method of birth control.  Menstrual cycle.  Pregnancy history. Screening  You may have the following tests or measurements:  Height, weight, and BMI.  Blood pressure.  Lipid and cholesterol levels. These may be checked every 5 years, or more frequently if you are over 70 years old.  Skin check.  Lung cancer screening. You may have this screening every year starting at age 3 if you have a 30-pack-year history of smoking and currently smoke or have quit within the past 15 years.  Fecal occult blood test (FOBT) of the stool. You may have this test every year starting at age 33.  Flexible sigmoidoscopy or colonoscopy. You may have a sigmoidoscopy every 5 years or a colonoscopy every 10 years starting at age 40.  Hepatitis C blood test.  Hepatitis B blood test.  Sexually transmitted disease (STD) testing.  Diabetes screening. This is done by checking your blood sugar (glucose) after you have not eaten for a while (fasting). You may have this done every 1-3 years.  Mammogram. This may be done every 1-2 years. Talk to your health care provider about when you should start having regular mammograms. This may depend on whether you have a family history of breast cancer.  BRCA-related cancer screening. This may be done if you have a family history of breast, ovarian, tubal, or peritoneal cancers.  Pelvic exam and Pap test. This may be done every 3 years starting at age 67. Starting at age 64, this may be done every 5  years if you have a Pap test in combination with an HPV test.  Bone density scan. This is done to screen for osteoporosis. You may have this scan if you are at high risk for osteoporosis. Discuss your test results, treatment options, and if  necessary, the need for more tests with your health care provider. Vaccines  Your health care provider may recommend certain vaccines, such as:  Influenza vaccine. This is recommended every year.  Tetanus, diphtheria, and acellular pertussis (Tdap, Td) vaccine. You may need a Td booster every 10 years.  Zoster vaccine. You may need this after age 72.  Pneumococcal 13-valent conjugate (PCV13) vaccine. You may need this if you have certain conditions and were not previously vaccinated.  Pneumococcal polysaccharide (PPSV23) vaccine. You may need one or two doses if you smoke cigarettes or if you have certain conditions. Talk to your health care provider about which screenings and vaccines you need and how often you need them. This information is not intended to replace advice given to you by your health care provider. Make sure you discuss any questions you have with your health care provider. Document Released: 07/04/2015 Document Revised: 02/25/2016 Document Reviewed: 04/08/2015 Elsevier Interactive Patient Education  2017 [employer] Prevention in the Home Falls can cause injuries. They can happen to people of all ages. There are many things you can do to make your home safe and to help prevent falls. What can I do on the outside of my home?  Regularly fix the edges of walkways and driveways and fix any cracks.  Remove anything that might make you trip as you walk through a door, such as a raised step or threshold.  Trim any bushes or trees on the path to your home.  Use bright outdoor lighting.  Clear any walking paths of anything that might make someone trip, such as rocks or tools.  Regularly check to see if handrails are loose or broken. Make sure that both sides of any steps have handrails.  Any raised decks and porches should have guardrails on the edges.  Have any leaves, snow, or ice cleared regularly.  Use sand or salt on walking paths during  winter.  Clean up any spills in your garage right away. This includes oil or grease spills. What can I do in the bathroom?  Use night lights.  Install grab bars by the toilet and in the tub and shower. Do not use towel bars as grab bars.  Use non-skid mats or decals in the tub or shower.  If you need to sit down in the shower, use a plastic, non-slip stool.  Keep the floor dry. Clean up any water that spills on the floor as soon as it happens.  Remove soap buildup in the tub or shower regularly.  Attach bath mats securely with double-sided non-slip rug tape.  Do not have throw rugs and other things on the floor that can make you trip. What can I do in the bedroom?  Use night lights.  Make sure that you have a light by your bed that is easy to reach.  Do not use any sheets or blankets that are too big for your bed. They should not hang down onto the floor.  Have a firm chair that has side arms. You can use this for support while you get dressed.  Do not have throw rugs and other things on the floor that can make you trip. What can I do  in the kitchen?  Clean up any spills right away.  Avoid walking on wet floors.  Keep items that you use a lot in easy-to-reach places.  If you need to reach something above you, use a strong step stool that has a grab bar.  Keep electrical cords out of the way.  Do not use floor polish or wax that makes floors slippery. If you must use wax, use non-skid floor wax.  Do not have throw rugs and other things on the floor that can make you trip. What can I do with my stairs?  Do not leave any items on the stairs.  Make sure that there are handrails on both sides of the stairs and use them. Fix handrails that are broken or loose. Make sure that handrails are as long as the stairways.  Check any carpeting to make sure that it is firmly attached to the stairs. Fix any carpet that is loose or worn.  Avoid having throw rugs at the top or  bottom of the stairs. If you do have throw rugs, attach them to the floor with carpet tape.  Make sure that you have a light switch at the top of the stairs and the bottom of the stairs. If you do not have them, ask someone to add them for you. What else can I do to help prevent falls?  Wear shoes that:  Do not have high heels.  Have rubber bottoms.  Are comfortable and fit you well.  Are closed at the toe. Do not wear sandals.  If you use a stepladder:  Make sure that it is fully opened. Do not climb a closed stepladder.  Make sure that both sides of the stepladder are locked into place.  Ask someone to hold it for you, if possible.  Clearly mark and make sure that you can see:  Any grab bars or handrails.  First and last steps.  Where the edge of each step is.  Use tools that help you move around (mobility aids) if they are needed. These include:  Canes.  Walkers.  Scooters.  Crutches.  Turn on the lights when you go into a dark area. Replace any light bulbs as soon as they burn out.  Set up your furniture so you have a clear path. Avoid moving your furniture around.  If any of your floors are uneven, fix them.  If there are any pets around you, be aware of where they are.  Review your medicines with your doctor. Some medicines can make you feel dizzy. This can increase your chance of falling. Ask your doctor what other things that you can do to help prevent falls. This information is not intended to replace advice given to you by your health care provider. Make sure you discuss any questions you have with your health care provider. Document Released: 04/03/2009 Document Revised: 11/13/2015 Document Reviewed: 07/12/2014 Elsevier Interactive Patient Education  2017 [employer].

## 2019-11-20 NOTE — Progress Notes (Signed)
Subjective:   Renee Mack is a 54 y.o. female who presents for an Initial Medicare Annual Wellness Visit.  I connected with Sanya today by telephone and verified that I am speaking with the correct person using two identifiers. Location patient: home Location provider: work Persons participating in the virtual visit: patient, Marine scientist.    I discussed the limitations, risks, security and privacy concerns of performing an evaluation and management service by telephone and the availability of in person appointments. I also discussed with the patient that there may be a patient responsible charge related to this service. The patient expressed understanding and verbally consented to this telephonic visit.    Interactive audio and video telecommunications were attempted between this provider and patient, however failed, due to patient having technical difficulties OR patient did not have access to video capability.  We continued and completed visit with audio only.  Some vital signs may be absent or patient reported.   Time Spent with patient on telephone encounter: 30 minutes  Review of Systems     Cardiac Risk Factors include: diabetes mellitus;dyslipidemia     Objective:    Today's Vitals   11/20/19 1442  Weight: 141 lb (64 kg)  Height: 5\' 1"  (1.549 m)  PainSc: 4    Body mass index is 26.64 kg/m.  Advanced Directives 11/20/2019 11/22/2018 08/27/2018 08/15/2018 08/02/2018 07/31/2018 07/27/2018  Does Patient Have a Medical Advance Directive? No No No No No No No  Would patient like information on creating a medical advance directive? - - No - Patient declined No - Patient declined No - Patient declined No - Patient declined No - Patient declined    Current Medications (verified) Outpatient Encounter Medications as of 11/20/2019  Medication Sig  . acetaminophen (TYLENOL) 325 MG tablet Take 650 mg by mouth every 6 (six) hours as needed for mild pain.   Marland Kitchen albuterol (PROAIR HFA) 108 (90 Base)  MCG/ACT inhaler Inhale 1 puff into the lungs every 6 (six) hours as needed for wheezing or shortness of breath.   Marland Kitchen amitriptyline (ELAVIL) 50 MG tablet Take 75 mg by mouth at bedtime. [facility]   . aspirin 81 MG tablet Take 81 mg by mouth daily.  Marland Kitchen atorvastatin (LIPITOR) 80 MG tablet Take 1 tablet (80 mg total) by mouth daily at 6 PM.  . BD VEO INSULIN SYRINGE U/F 31G X 15/64" 0.5 ML MISC use as directed 4 times daily  . Blood Glucose Monitoring Suppl (GLUCOCOM BLOOD GLUCOSE MONITOR) DEVI 1 m.  . citalopram (CELEXA) 40 MG tablet Take 1 tablet (40 mg total) by mouth daily.  Marland Kitchen esomeprazole (NEXIUM) 40 MG capsule Take 1 capsule by mouth once daily  . insulin aspart (NOVOLOG) 100 UNIT/ML injection Inject 25 Units into the skin 3 (three) times daily before meals. Take 10 units PLUS sliding scale (Patient taking differently: Inject 35 Units into the skin 3 (three) times daily before meals. Take 10 units PLUS sliding scale )  . insulin glargine (LANTUS) 100 UNIT/ML injection Inject 60 Units into the skin 2 (two) times daily.   Elmore Guise Devices (SIMPLE DIAGNOSTICS LANCING DEV) MISC AS DIRECTED  . LORazepam (ATIVAN) 1 MG tablet Take 1 tablet (1 mg total) by mouth 3 (three) times daily as needed for anxiety. May take extra 0.5mg  (half tablet) if wake up overnight, for anxiety  . methocarbamol (ROBAXIN) 500 MG tablet Take 500 mg by mouth 4 (four) times daily.  . mirtazapine (REMERON) 15 MG tablet Take 15 mg  by mouth at bedtime.  . nitroGLYCERIN (NITROSTAT) 0.4 MG SL tablet Place 1 tablet (0.4 mg total) under the tongue every 5 (five) minutes as needed for chest pain.  . Oxycodone HCl 10 MG TABS Take 2 tablets by mouth every 3 (three) hours. [facility]  . polyethylene glycol powder (GLYCOLAX/MIRALAX) 17 GM/SCOOP powder Take by mouth.  . promethazine (PHENERGAN) 25 MG tablet Take 25 mg by mouth every 6 (six) hours as needed for nausea. for nausea  . ranolazine (RANEXA) 1000 MG SR tablet Take 1 tablet  (1,000 mg total) by mouth 2 (two) times daily.  . ticagrelor (BRILINTA) 90 MG TABS tablet Take 1 tablet (90 mg total) by mouth 2 (two) times daily. *NEEDS OFFICE VISIT FOR FURTHER REFILLS-PLEASE CALL [phone] TO SCHEDULE.*  . benzonatate (TESSALON) 100 MG capsule Take 1 capsule by mouth three times daily as needed for cough (Patient not taking: Reported on 11/20/2019)   No facility-administered encounter medications on file as of 11/20/2019.    Allergies (verified) Bee venom, Metformin and related, Darvon [propoxyphene], Gabapentin, Nsaids, Tramadol, and Contrast media [iodinated diagnostic agents]   History: Past Medical History:  Diagnosis Date  . Cancer ([place])    vulvular  . Cervical disc disease    a. 01/2018 MRI Cervical spine: Cervical spondylosis with multilevel disc and facet degeneration greatest at C5-6 and C6-7.  Moderate to sev R C5-6, mod L C5-6, and mod bilat C6-7 foraminal stenosis with multilevel mild foraminal stenosis.  Mild C5-6 and mod C6-7 canal stenosis. C6-7 central cord impingement w/ cord flattening.  . Chronic combined systolic (congestive) and diastolic (congestive) heart failure ([place])    a. 12/2017 Echo: EF 30-35%, mid-apicalanteroseptal, ant, and apical AK. Gr1 DD. Mild conc LVH; b. 03/2018 Echo: EF 45-50%, antsept, ant HK. Mild MR. Nl LA size. Nl RV fxn.  Marland Kitchen COPD (chronic obstructive pulmonary disease) ([facility])    not on home oxygen  . Coronary artery disease    a. 12/2017 ACS/PCI: LAD 100p (2.25x26 Resolute Onyx DES), 85m (2.0x12 Resolute Onyx DES), 80d (2.0x15 Resolute Onyx DES), RCA 90p (non-dominant). EF 25-35%. Post-MI course complicated by CGS; b. [id number] NSTEMI/subacute thrombosis-->LAD 100 (PTCA + DES x 1); c. 01/2018 NSTEMI/PCI: LM min irregs, LAD 50-60p ISR/hazy (3.5x12 Resolute DES), 55m/d (underexpansion of prior stents-->PTCA). EF 45-50%.  Marland Kitchen GERD (gastroesophageal reflux disease)   . H/O degenerative disc disease   . Hypotension   . Ischemic cardiomyopathy      a. 12/2017 Echo: EF 30-35%; b. 03/2018 Echo: EF 45-50%.  . Myocardial infarction ([place])    a. 12/2017-->DES to LAD x 3.  . Pancreatitis   . Shingles   . Spinal stenosis   . Tobacco abuse   . Ulcer (traumatic) of oral mucosa   . Urinary incontinence   . VIN III (vulvar intraepithelial neoplasia III)    Past Surgical History:  Procedure Laterality Date  . ABDOMINAL HYSTERECTOMY     partial  . CORONARY BALLOON ANGIOPLASTY N/A 05/22/2018   Procedure: CORONARY BALLOON ANGIOPLASTY;  Surgeon: Wellington Hampshire, MD;  Location: [facility];  Service: Cardiovascular;  Laterality: N/A;  . CORONARY STENT INTERVENTION N/A 12/26/2017   Procedure: CORONARY STENT INTERVENTION;  Surgeon: Wellington Hampshire, MD;  Location: [facility];  Service: Cardiovascular;  Laterality: N/A;  . CORONARY STENT INTERVENTION N/A 02/08/2018   Procedure: CORONARY STENT INTERVENTION;  Surgeon: Nelva Bush, MD;  Location: [facility];  Service: Cardiovascular;  Laterality: N/A;  . CORONARY/GRAFT ACUTE  MI REVASCULARIZATION N/A 12/19/2017   Procedure: Coronary/Graft Acute MI Revascularization;  Surgeon: Wellington Hampshire, MD;  Location: [facility];  Service: Cardiovascular;  Laterality: N/A;  . ECTOPIC PREGNANCY SURGERY Left   . INTRAVASCULAR ULTRASOUND/IVUS N/A 02/08/2018   Procedure: Intravascular Ultrasound/IVUS;  Surgeon: Nelva Bush, MD;  Location: [facility];  Service: Cardiovascular;  Laterality: N/A;  . LEFT HEART CATH AND CORONARY ANGIOGRAPHY N/A 12/19/2017   Procedure: LEFT HEART CATH AND CORONARY ANGIOGRAPHY;  Surgeon: Wellington Hampshire, MD;  Location: [place] [facility];  Service: Cardiovascular;  Laterality: N/A;  . LEFT HEART CATH AND CORONARY ANGIOGRAPHY N/A 12/26/2017   Procedure: LEFT HEART CATH AND CORONARY ANGIOGRAPHY;  Surgeon: Wellington Hampshire, MD;  Location: [facility];  Service: Cardiovascular;  Laterality: N/A;  . LEFT HEART CATH AND CORONARY  ANGIOGRAPHY N/A 02/08/2018   Procedure: LEFT HEART CATH AND CORONARY ANGIOGRAPHY;  Surgeon: Nelva Bush, MD;  Location: [place] [facility];  Service: Cardiovascular;  Laterality: N/A;  . LEFT HEART CATH AND CORONARY ANGIOGRAPHY N/A 05/22/2018   Procedure: LEFT HEART CATH AND CORONARY ANGIOGRAPHY;  Surgeon: Wellington Hampshire, MD;  Location: [facility];  Service: Cardiovascular;  Laterality: N/A;  . LEFT HEART CATH AND CORONARY ANGIOGRAPHY N/A 06/15/2018   Procedure: LEFT HEART CATH AND CORONARY ANGIOGRAPHY;  Surgeon: Wellington Hampshire, MD;  Location: [facility];  Service: Cardiovascular;  Laterality: N/A;  . LYMPHADENECTOMY    . SPLENECTOMY, PARTIAL    . vulvulasectomy     Family History  Problem Relation Age of Onset  . Non-Hodgkin's lymphoma Mother   . Osteoporosis Mother   . Lupus Sister   . Heart disease Brother   . Heart attack Brother   . Heart attack Father    Social History   Socioeconomic History  . Marital status: Widowed    Spouse name: Not on file  . Number of children: 4  . Years of education: Not on file  . Highest education level: Not on file  Occupational History  . Occupation: disabled  Tobacco Use  . Smoking status: Former Smoker    Packs/day: 0.50    Years: 30.00    Pack years: 15.00    Types: Cigarettes    Quit date: 12/19/2017    Years since quitting: 1.9  . Smokeless tobacco: Former Systems developer  . Tobacco comment: quit 12/19/2017.  Substance and Sexual Activity  . Alcohol use: No  . Drug use: No  . Sexual activity: Not Currently  Other Topics Concern  . Not on file  Social History Narrative   Lives in [place] by herself.  Does not routinely exercise.   Social Determinants of Health   Financial Resource Strain: Low Risk   . Difficulty of Paying Living Expenses: Not hard at all  Food Insecurity:   . Worried About Charity fundraiser in the Last Year:   . Arboriculturist in the Last Year:   Transportation Needs:   . Lexicographer (Medical):   Marland Kitchen Lack of Transportation (Non-Medical):   Physical Activity: Sufficiently Active  . Days of Exercise per Week: 7 days  . Minutes of Exercise per Session: 30 min  Stress:   . Feeling of Stress :   Social Connections:   . Frequency of Communication with Friends and Family:   . Frequency of Social Gatherings with Friends and Family:   . Attends Religious Services:   . Active Member  of Clubs or Organizations:   . Attends Archivist Meetings:   Marland Kitchen Marital Status:     Tobacco Counseling Counseling given: Not Answered Comment: quit 12/19/2017.   Clinical Intake:  Pre-visit preparation completed: Yes  Pain : 0-10 Pain Score: 4  Pain Type: Chronic pain Pain Location: Back Pain Descriptors / Indicators: Aching, Sharp, Throbbing Pain Onset: More than a month ago Pain Frequency: Constant Pain Relieving Factors: oxycontin  Pain Relieving Factors: oxycontin  Nutritional Status: BMI 25 -29 Overweight Nutritional Risks: Other (Comment)(decreased appetite, drinks Boost daily) Diabetes: Yes CBG done?: Yes(341 today) CBG resulted in Enter/ Edit results?: No  How often do you need to have someone help you when you read instructions, pamphlets, or other written materials from your doctor or pharmacy?: 1 - Never  Nutrition Risk Assessment:  Has the patient had any N/V/D within the last 2 months?  No  Does the patient have any non-healing wounds?  No  Has the patient had any unintentional weight loss or weight gain?  No   Diabetes:  Is the patient diabetic?  Yes If diabetic, was a CBG obtained today?  No Patient obtained at home How often do you monitor your CBG's? daily.   Financial Strains and Diabetes Management:  Are you having any financial strains with the device, your supplies or your medication? No .  Does the patient want to be seen by Chronic Care Management for management of their diabetes?  No  Would the patient like to be referred  to a Nutritionist or for Diabetic Management?  No   Diabetic Exams:  Diabetic Eye Exam: Completed per patient within the last year. Patient unsure of eye doctor's name. Pt has been advised about the importance in completing this exam.  Diabetic Foot Exam: Completed 04/26/2019.       Information entered by :: Caroleen Hamman LPNActivities of Daily Living In your present state of health, do you have any difficulty performing the following activities: 11/20/2019 03/14/2019  Hearing? N N  Vision? N N  Difficulty concentrating or making decisions? N N  Walking or climbing stairs? Y N  Comment Stairs due to back pain -  Dressing or bathing? - N  Doing errands, shopping? N N  Preparing Food and eating ? N -  Using the Toilet? N -  In the past six months, have you accidently leaked urine? Y -  Comment uses pads -  Do you have problems with loss of bowel control? N -  Managing your Medications? N -  Managing your Finances? N -  Housekeeping or managing your Housekeeping? N -  Some recent data might be hidden     Immunizations and Health Maintenance Immunization History  Administered Date(s) Administered  . Hepatitis B, adult 04/28/2006, 05/30/2006, 10/26/2006  . Influenza, Seasonal, Injecte, Preservative Fre 03/30/2006, 05/12/2007, 03/11/2008, 07/16/2009, 03/13/2010, 03/18/2011, 07/10/2012, 05/08/2013  . Influenza,inj,Quad PF,6+ Mos 03/01/2014, 07/03/2016, 06/15/2017, 02/27/2018  . Pneumococcal Polysaccharide-23 03/30/2006, 03/11/2008, 06/15/2017   Health Maintenance Due  Topic Date Due  . Hepatitis C Screening  Never done  . OPHTHALMOLOGY EXAM  Never done  . HEMOGLOBIN A1C  10/24/2019    Patient Care Team: Olin Hauser, DO as PCP - General (Family Medicine) Wellington Hampshire, MD as PCP - Cardiology (Cardiology) Clent Jacks, RN as Registered Nurse Morton Stall, Howell Rucks, NP as Nurse Practitioner (Nurse Practitioner) Shirline Frees, MD as Referring Physician  (Radiation Oncology)  Indicate any recent Medical Services you may have received from  other than [facility] providers in the past year (date may be approximate).     Assessment:   This is a routine wellness examination for [place].  Hearing/Vision screen  Hearing Screening   125Hz  250Hz  500Hz  1000Hz  2000Hz  3000Hz  4000Hz  6000Hz  8000Hz   Right ear:           Left ear:           Vision Screening Comments: Goes to eye doctor annually  Dietary issues and exercise activities discussed: Current Exercise Habits: Home exercise routine, Type of exercise: walking, Time (Minutes): 30, Frequency (Times/Week): 7, Weekly Exercise (Minutes/Week): 210, Intensity: Mild, Exercise limited by: orthopedic condition(s)  Goals Addressed   None    Depression Screen PHQ 2/9 Scores 11/20/2019 07/27/2019 04/26/2019 03/14/2019 12/07/2018 09/04/2018 05/31/2018  PHQ - 2 Score 3 1 4 6 4 4 2   PHQ- 9 Score 14 5 14 11 11 14 12     Fall Risk Fall Risk  11/20/2019 03/14/2019 05/31/2018  Falls in the past year? 0 0 0  Number falls in past yr: 0 0 -  Injury with Fall? 0 0 -  Follow up - - Falls evaluation completed    [place]:  Any stairs in or around the home? Yes  If so, are there any without handrails? No   Home free of loose throw rugs in walkways, pet beds, electrical cords, etc? Yes  Adequate lighting in your home to reduce risk of falls? Yes   ASSISTIVE DEVICES UTILIZED TO PREVENT FALLS:  Life alert? No  Use of a cane, walker or w/c? No  Grab bars in the bathroom? Yes  Shower chair or bench in shower? No  Elevated toilet seat or a handicapped toilet? No    TIMED UP AND GO:  Was the test performed? No . Virtual visit   Cognitive Function: No impairment noted        Screening Tests Health Maintenance  Topic Date Due  . Hepatitis C Screening  Never done  . OPHTHALMOLOGY EXAM  Never done  . HEMOGLOBIN A1C  10/24/2019  . COVID-19 Vaccine (1) 12/06/2019 (Originally  11/21/1977)  . MAMMOGRAM  11/19/2020 (Originally 11/20/2013)  . TETANUS/TDAP  11/19/2020 (Originally 11/21/1982)  . INFLUENZA VACCINE  01/20/2020  . FOOT EXAM  04/25/2020  . URINE MICROALBUMIN  04/25/2020  . PAP SMEAR-Modifier  05/24/2021  . Fecal DNA (Cologuard)  03/25/2022  . PNEUMOCOCCAL POLYSACCHARIDE VACCINE AGE 109-64 HIGH RISK  Completed  . HIV Screening  Completed    Qualifies for Shingles Vaccine? Yes  . Due for Shingrix. Education has been provided regarding the importance of this vaccine. Pt has been advised to call insurance company to determine out of pocket expense. Advised may also receive vaccine at local pharmacy or Health Dept. Verbalized acceptance and understanding.  Tdap: Although this vaccine is not a covered service during a Wellness Exam, does the patient still wish to receive this vaccine today?  No .  Education has been provided regarding the importance of this vaccine. Advised may receive this vac cine at local pharmacy or Health Dept. Aware to provide a copy of the vaccination record if obtained from local pharmacy or Health Dept. Verbalized acceptance and understanding.  Flu Vaccine: Due 02/2020  Pneumococcal Vaccine: Completed .   COVID-19 vaccine: Patient declined.   Cancer Screenings:  Colorectal Screening: Completed Cologuard 03/26/2019.  Mammogram:  Patient declined  Bone Density: Not indicated  Lung Cancer Screening: (Low Dose CT Chest recommended if Age  55-80 years, 30 pack-year currently smoking OR have quit w/in 15years.) does not qualify.    Additional Screening:  Hepatitis C Screening: PCP to order   Dental Screening: Recommended annual dental exams for proper oral hygiene  Community Resource Referral:  CRR required this visit?  No       Plan:  I have personally reviewed and addressed the Medicare Annual Wellness questionnaire and have noted the following in the patient's chart:  A. Medical and social history B. Use of alcohol, tobacco  or illicit drugs  C. Current medications and supplements D. Functional ability and status E.  Nutritional status F.  Physical activity G. Advance directives H. List of other physicians I.  Hospitalizations, surgeries, and ER visits in previous 12 months J.  [place] such as hearing and vision if needed, cognitive and depression L. Referrals and appointments   In addition, I have reviewed and discussed with patient certain preventive protocols, quality metrics, and best practice recommendations. A written personalized care plan for preventive services as well as general preventive health recommendations were provided to patient.   Signed,    Marta Antu, LPN   [id number]  Nurse Health Advisor    Nurse Notes: None

## 2019-11-21 NOTE — Progress Notes (Deleted)
Cardiology Office Note    Date:  11/21/2019   ID:  Renee Mack, DOB May 24, 1966, MRN [id number]  PCP:  Olin Hauser, DO  Cardiologist:  [name], MD  Electrophysiologist:  None   Chief Complaint: Follow up  History of Present Illness:   Renee Mack is a 54 y.o. female with history of CAD s/pMI and drug-eluting stent placement to LAD x3 in7/2019,with subsequent readmissions for subacute stent thrombosis and repeat PCI x3s/p PTCA most recently in [id number], combined systolic and diastolic congestive heart failure secondary to ICM with last EF of 50%, poorly controlled DM2, vulvar cancer with radiation therapy at [facility] with chronic pain, COPD with former tobacco use, HLD, pancreatitis, and GERD who presents for follow up of her CAD and ICM.   Renee Mack had a complicated few months following initial STEMI in 12/2017, for which she underwent cardiac cath that showedLAD 100p, 17m, 80d, RCA 90p (non-dominant).She required three DES to the LAD. Her recovery was complicated by cardiogenic shock and hypotension requiring vasopressors. Following initial MI, her EF was reduced to 30-35% and was provided a LifeVest at discharge. This was followed by two separate admissions for chest, neck, arm, and back pain with associated nausea and vomiting. She underwent a cardiac cath, once in later 12/2017 and again in 01/2018, with each requiring PCI and stent placement related to ISR of the LAD. For recurrent pain, it was recommended to consider PCI of the nondominant RCA with possible hemodynamic assessment of the coronary-pulmonary artery fisulae to exclude coronary steal phenomenon.Repeat echo in 03/2018, showed continued improvement with an EF of 45-50%. Her LifeVest was able to be discontinued at that time. She has had issues with neck, arm and back pain related to cervical disc disease that was found on MRI in August and was admitted again in October for the same pain. Her cardiac work up  was negative at that time, her pain was reproducible with movementand attributed to cervical disc disease. She has been noted to have relative hypotension which has precluded the addition of long-acting nitrate therapy.  She was admitted in 05/2018 with recurrent chest pain and ruled out.  She underwent repeat cardiac cath on 05/22/2018 that showed significant underlying three-vessel CAD with patent LAD stents except in the mid to distal segment where there was 95% stenosis that appeared to be a combination of restenosis and possible localized thrombus. She was noted to have stable proximal RCA disease in a nondominant vessel. 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 She underwent successful PTCA to the mid LAD with noncompliant balloon high pressure. She was noted to have normal LVEDP.  She was admitted again in 05/2018, with recurrent chest pain and again ruled out.  Diagnostic LHC on 06/15/2018, showed a widely patent LAD stent with no significant restenosis.  Stable 90% stenosis in the nondominant small RCA not different than previous.  There was also a small OM 2 which was subtotally occluded proximally which was unchanged from recent cardiac cath in August.  Mildly reduced LVSF with an EF of 45 to 50% with anterior wall hypokinesis.  Normal LVEDP.  Continuation of medical therapy was recommended.  She was last seen virtually in 10/2018 and was stable from a cardiac perspective with no recurrent chest pain or SOB.   ***   Labs independently reviewed: 04/2019 - A1c 14.0 08/2018 - potassium 4.4, BUN 18, SCr 0.49, albumin 3.7, AST/ALT normal, HGB 15.3, PLT  233 07/2018 - TC 197, TG 215, HDL 36, LDL 118 05/2018 - TSH normal  Past Medical History:  Diagnosis Date  . Cancer ([place])    vulvular  . Cervical disc disease    a. 01/2018 MRI Cervical spine: Cervical spondylosis with multilevel disc and facet degeneration greatest at C5-6 and C6-7.   Moderate to sev R C5-6, mod L C5-6, and mod bilat C6-7 foraminal stenosis with multilevel mild foraminal stenosis.  Mild C5-6 and mod C6-7 canal stenosis. C6-7 central cord impingement w/ cord flattening.  . Chronic combined systolic (congestive) and diastolic (congestive) heart failure ([name])    a. 12/2017 Echo: EF 30-35%, mid-apicalanteroseptal, ant, and apical AK. Gr1 DD. Mild conc LVH; b. 03/2018 Echo: EF 45-50%, antsept, ant HK. Mild MR. Nl LA size. Nl RV fxn.  Marland Kitchen COPD (chronic obstructive pulmonary disease) (HCC)    not on home oxygen  . Coronary artery disease    a. 12/2017 ACS/PCI: LAD 100p (2.25x26 Resolute Onyx DES), 23m (2.0x12 Resolute Onyx DES), 80d (2.0x15 Resolute Onyx DES), RCA 90p (non-dominant). EF 25-35%. Post-MI course complicated by CGS; b. [id number] NSTEMI/subacute thrombosis-->LAD 100 (PTCA + DES x 1); c. 01/2018 NSTEMI/PCI: LM min irregs, LAD 50-60p ISR/hazy (3.5x12 Resolute DES), 23m/d (underexpansion of prior stents-->PTCA). EF 45-50%.  Marland Kitchen GERD (gastroesophageal reflux disease)   . H/O degenerative disc disease   . Hypotension   . Ischemic cardiomyopathy    a. 12/2017 Echo: EF 30-35%; b. 03/2018 Echo: EF 45-50%.  . Myocardial infarction ([place])    a. 12/2017-->DES to LAD x 3.  . Pancreatitis   . Shingles   . Spinal stenosis   . Tobacco abuse   . Ulcer (traumatic) of oral mucosa   . Urinary incontinence   . VIN III (vulvar intraepithelial neoplasia III)     Past Surgical History:  Procedure Laterality Date  . ABDOMINAL HYSTERECTOMY     partial  . CORONARY BALLOON ANGIOPLASTY N/A 05/22/2018   Procedure: CORONARY BALLOON ANGIOPLASTY;  Surgeon: Wellington Hampshire, MD;  Location: [place] [facility];  Service: Cardiovascular;  Laterality: N/A;  . CORONARY STENT INTERVENTION N/A 12/26/2017   Procedure: CORONARY STENT INTERVENTION;  Surgeon: Wellington Hampshire, MD;  Location: [place] [facility];  Service: Cardiovascular;  Laterality: N/A;  . CORONARY STENT INTERVENTION N/A  02/08/2018   Procedure: CORONARY STENT INTERVENTION;  Surgeon: Nelva Bush, MD;  Location: [facility];  Service: Cardiovascular;  Laterality: N/A;  . CORONARY/GRAFT ACUTE MI REVASCULARIZATION N/A 12/19/2017   Procedure: Coronary/Graft Acute MI Revascularization;  Surgeon: Wellington Hampshire, MD;  Location: [place] [facility];  Service: Cardiovascular;  Laterality: N/A;  . ECTOPIC PREGNANCY SURGERY Left   . INTRAVASCULAR ULTRASOUND/IVUS N/A 02/08/2018   Procedure: Intravascular Ultrasound/IVUS;  Surgeon: Nelva Bush, MD;  Location: [place] [facility];  Service: Cardiovascular;  Laterality: N/A;  . LEFT HEART CATH AND CORONARY ANGIOGRAPHY N/A 12/19/2017   Procedure: LEFT HEART CATH AND CORONARY ANGIOGRAPHY;  Surgeon: Wellington Hampshire, MD;  Location: [place] [facility];  Service: Cardiovascular;  Laterality: N/A;  . LEFT HEART CATH AND CORONARY ANGIOGRAPHY N/A 12/26/2017   Procedure: LEFT HEART CATH AND CORONARY ANGIOGRAPHY;  Surgeon: Wellington Hampshire, MD;  Location: [place] [facility];  Service: Cardiovascular;  Laterality: N/A;  . LEFT HEART CATH AND CORONARY ANGIOGRAPHY N/A 02/08/2018   Procedure: LEFT HEART CATH AND CORONARY ANGIOGRAPHY;  Surgeon: Nelva Bush, MD;  Location: [place] [facility];  Service: Cardiovascular;  Laterality: N/A;  .  LEFT HEART CATH AND CORONARY ANGIOGRAPHY N/A 05/22/2018   Procedure: LEFT HEART CATH AND CORONARY ANGIOGRAPHY;  Surgeon: Wellington Hampshire, MD;  Location: [place] [facility];  Service: Cardiovascular;  Laterality: N/A;  . LEFT HEART CATH AND CORONARY ANGIOGRAPHY N/A 06/15/2018   Procedure: LEFT HEART CATH AND CORONARY ANGIOGRAPHY;  Surgeon: Wellington Hampshire, MD;  Location: [place] [facility];  Service: Cardiovascular;  Laterality: N/A;  . LYMPHADENECTOMY    . SPLENECTOMY, PARTIAL    . vulvulasectomy      Current Medications: No outpatient medications have been marked as taking for the 11/26/19 encounter (Appointment) with Rise Mu, PA-C.    Allergies:   Bee venom, Metformin and related, Darvon [propoxyphene], Gabapentin, Nsaids, Tramadol, and Contrast media [iodinated diagnostic agents]   Social History   Socioeconomic History  . Marital status: Widowed    Spouse name: Not on file  . Number of children: 4  . Years of education: Not on file  . Highest education level: Not on file  Occupational History  . Occupation: disabled  Tobacco Use  . Smoking status: Former Smoker    Packs/day: 0.50    Years: 30.00    Pack years: 15.00    Types: Cigarettes    Quit date: 12/19/2017    Years since quitting: 1.9  . Smokeless tobacco: Former Systems developer  . Tobacco comment: quit 12/19/2017.  Substance and Sexual Activity  . Alcohol use: No  . Drug use: No  . Sexual activity: Not Currently  Other Topics Concern  . Not on file  Social History Narrative   Lives in [place] by herself.  Does not routinely exercise.   Social Determinants of Health   Financial Resource Strain: Low Risk   . Difficulty of Paying Living Expenses: Not hard at all  Food Insecurity:   . Worried About Charity fundraiser in the Last Year:   . Arboriculturist in the Last Year:   Transportation Needs:   . Film/video editor (Medical):   Marland Kitchen Lack of Transportation (Non-Medical):   Physical Activity: Sufficiently Active  . Days of Exercise per Week: 7 days  . Minutes of Exercise per Session: 30 min  Stress:   . Feeling of Stress :   Social Connections:   . Frequency of Communication with Friends and Family:   . Frequency of Social Gatherings with Friends and Family:   . Attends Religious Services:   . Active Member of Clubs or Organizations:   . Attends Archivist Meetings:   Marland Kitchen Marital Status:      Family History:  The patient's family history includes Heart attack in her brother and father; Heart disease in her brother; Lupus in her sister; Non-Hodgkin's lymphoma in her mother; Osteoporosis in her mother.  ROS:    ROS   EKGs/Labs/Other Studies Reviewed:    Studies reviewed were summarized above. The additional studies were reviewed today:  LHC 06/15/2018:  Ost 2nd Diag to 2nd Diag lesion is 90% stenosed.  Non-stenotic Prox LAD lesion was previously treated.  Non-stenotic Mid LAD to Dist LAD lesion was previously treated.  Non-stenotic Mid LAD lesion.  Balloon angioplasty was performed.  Prox Cx lesion is 30% stenosed.  Prox RCA lesion is 90% stenosed.  There is mild left ventricular systolic dysfunction.  LV end diastolic pressure is normal.  The left ventricular ejection fraction is 45-50% by visual estimate.  Ost 2nd Mrg to 2nd Mrg lesion is 100% stenosed.  1.  Widely patent LAD stent with no significant restenosis.  Stable 90% stenosis in the nondominant small right coronary artery not different from before.  Also there is a small OM2 which is subtotally occluded proximally.  This is also unchanged from recent cardiac catheterization and catheterization in August.  2.  Mildly reduced LV systolic function with an EF of 45 to 50% with anterior wall hypokinesis.  Normal left ventricular end-diastolic pressure.  Recommendations: Continue medical therapy. No clear culprit for the patient's symptoms other than known small vessel disease.  __________  LHC 05/22/2018:  Ost 2nd Diag to 2nd Diag lesion is 90% stenosed.  Previously placed Prox LAD drug eluting stent is widely patent.  Prox Cx lesion is 30% stenosed.  Prox RCA lesion is 90% stenosed.  Mid LAD lesion is 95% stenosed.  Previously placed Mid LAD to Dist LAD stent (unknown type) is widely patent.  Post intervention, there is a 0% residual stenosis.  Balloon angioplasty was performed using a BALLOON [place] RX3.0X15.  LV end diastolic pressure is normal.   1.  Significant underlying three-vessel coronary artery disease with patent LAD stents except in the mid to distal segment where there is 95% stenosis that  appears to be a combination of restenosis and possible localized thrombosis (given that that lesion was soft to dilation).  Stable proximal RCA disease in a nondominant vessel.  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  2.  Normal left ventricular end-diastolic pressure.  Left ventricular angiography was not performed. 3.  Successful balloon angioplasty to the mid LAD with noncompliant balloon high pressure.  Recommendations: Continue indefinite dual antiplatelet therapy. Aggressive treatment of risk factors.  Likely discharge home tomorrow. __________  2D echo 03/2018: - Left ventricle: The cavity size was normal. Systolic function was  mildly reduced. The estimated ejection fraction was in the range  of 45% to 50%. Hypokinesis of the anteroseptal myocardium.  Hypokinesis of the anterior myocardium. Left ventricular  diastolic function parameters were normal.  - Mitral valve: There was mild regurgitation.  - Left atrium: The atrium was normal in size.  - Right ventricle: Systolic function was normal.  - Pulmonary arteries: Systolic pressure could not be accurately  estimated.   Impressions:   - Compared to previous study, EF has improved. ___________  LHC 02/08/2018:  A drug-eluting stent was successfully placed using a STENT RESOLUTE ONYX 3.5X12.   Conclusions: 1. Significant two-vessel coronary artery disease involving proximal and midportion of previously stented LAD as well as nondominant RCA. 2. Patent stents in the proximal through distal LAD, with underexpansion in the proximal and mid portions as well as concern for area of early restenosis versus nonocclusive thrombus formation. 3. 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 4. Mildly reduced left ventricular systolic function with mid and apical anterior  hypokinesis.  LVEF 45 to 50%. 5. Normal left ventricular systolic function. 6. Successful IVUS-guided PCI to LAD with stenting of the proximal LAD and angioplasty of the proximal and mid LAD stented segments with improved stent expansion and 0% residual stenosis.  Recommendations: 1. Continue aggressive secondary prevention as well as antianginal therapy. 2. Defer treatment of nondominant RCA. 3. If chest pain recurs, one would need to consider PCI to nondominant RCA as a heroic measure and/or hemodynamic assessment of coronary-pulmonary artery fistulae to exclude coronary steal phenomenon.  Recommend dual antiplatelet therapy with Aspirin  81mg  daily and Ticagrelor 90mg  twice daily long-term (beyond 12 months) because of Long stented segment extending from the proximal through distal LAD with history of prior subacute stent thrombosis.  __________  LHC 12/26/2017:  Dist LAD lesion is 10% stenosed.  Balloon angioplasty was performed.  Prox Cx lesion is 30% stenosed.  Prox RCA lesion is 90% stenosed.  Prox LAD to Mid LAD lesion is 100% stenosed.  Mid LAD lesion is 70% stenosed.  A drug-eluting stent was successfully placed using a STENT RESOLUTE ONYX 2.0X18.  Post intervention, there is a 15% residual stenosis.  Post intervention, there is a 10% residual stenosis.  Balloon angioplasty was performed using a BALLOON [place] RX2.5X20.  Post intervention, there is a 0% residual stenosis.  Ost 2nd Diag to 2nd Diag lesion is 90% stenosed.   1.  Subacute stent thrombosis in the LAD likely due to poor outflow related to previous late presentation of anterior myocardial infarction as well as long segments of stents.  Collaterals are noted from the right coronary artery to the LAD. 2.  Moderately elevated left ventricular end-diastolic pressure. 3.  Successful very difficult angioplasty and drug-eluting stent placement to the LAD as outlined above.  Recommendations: Given anterior  apical scar related to late presentation as well as long segments of stents, there is a high chance that the LAD will close again in the future.  If that happens, there might not be much benefit of attempting another PCI.  Might be best to rely on collaterals from the right coronary artery.  In that situation, PCI of the right coronary artery might be indicated in spite of the vessel being nondominant.  This will be considered before hospital discharge.  Recommend uninterrupted dual antiplatelet therapy with Aspirin 81mg  daily and Ticagrelor 90mg  twice daily for a minimum of 12 months (ACS - Class I recommendation).  Prefer long-term treatment beyond 1 year due to stent thrombosis.  Please note that the patient's EKG did not meet criteria for a STEMI and this was done as an urgent case due to continued chest pain. __________  2D echo 12/19/2017: - Left ventricle: The cavity size was normal. There was mild  concentric hypertrophy. Systolic function was moderately to  severely reduced. The estimated ejection fraction was in the  range of 30% to 35%. Akinesis of the mid-apicalanteroseptal,  anterior, and apical myocardium. Doppler parameters are  consistent with abnormal left ventricular relaxation (grade 1  diastolic dysfunction). ___________  LHC 12/19/2017:  Prox Cx lesion is 30% stenosed.  Prox LAD lesion is 100% stenosed.  Mid LAD lesion is 80% stenosed.  Dist LAD lesion is 80% stenosed.  Post intervention, there is a 0% residual stenosis.  A drug-eluting stent was successfully placed using a STENT RESOLUTE ONYX 2.25X26.  Post intervention, there is a 0% residual stenosis.  Post intervention, there is a 10% residual stenosis.  A drug-eluting stent was successfully placed using a STENT RESOLUTE ONYX 2.0X15.  A drug-eluting stent was successfully placed using a STENT RESOLUTE ONYX 2.0X12.  The left ventricular ejection fraction is 25-35% by visual estimate.  Prox  RCA lesion is 90% stenosed.   1.  Severe one-vessel coronary artery disease with thrombotic occlusion of the proximal LAD and significant disease in the mid and distal LAD.  Left dominant system.  Significant proximal stenosis in a nondominant right coronary artery. 2.  Moderately to severely reduced LV systolic function with an EF of 30% with akinesis of the mid to distal anterior  and apical myocardium. 3.  Moderately elevated left ventricular end-diastolic pressure at 26 mmHg. 4.  Successful angioplasty and 3 drug-eluting stent placement to the LAD to establish TIMI-3 flow.  Recommendations Continue Aggrastat for 12 hours. Dual antiplatelet therapy for at least one year Aggressive treatment of risk factors. Obtain an echocardiogram. Wean off norepinephrine drip and start small dose carvedilol once blood pressure tolerates.  Please note that the initial EKGs did not meet STEMI criteria given that there was ST elevation only in V3.  However, given the patient's continued symptoms and change in her EKG from prior one, we decided to proceed with urgent cardiac catheterization.   EKG:  EKG is ordered today.  The EKG ordered today demonstrates ***  Recent Labs: No results found for requested labs within last 8760 hours.  Recent Lipid Panel    Component Value Date/Time   CHOL 197 07/27/2018 1507   TRIG 215 (H) 07/27/2018 1507   HDL 36 (L) 07/27/2018 1507   CHOLHDL 5.5 07/27/2018 1507   VLDL 43 (H) 07/27/2018 1507   LDLCALC 118 (H) 07/27/2018 1507    PHYSICAL EXAM:    VS:  There were no vitals taken for this visit.  BMI: There is no height or weight on file to calculate BMI.  Physical Exam  Wt Readings from Last 3 Encounters:  11/20/19 141 lb (64 kg)  04/26/19 141 lb 9.6 oz (64.2 kg)  11/22/18 135 lb 6.4 oz (61.4 kg)     ASSESSMENT & PLAN:   1. ***  Disposition: F/u with Dr. Fletcher Anon or an APP in ***.   Medication Adjustments/Labs and Tests Ordered: Current medicines are  reviewed at length with the patient today.  Concerns regarding medicines are outlined above. Medication changes, Labs and Tests ordered today are summarized above and listed in the Patient Instructions accessible in Encounters.   Signed, Christell Faith, PA-C 11/21/2019 3:32 PM     [facility] [address] [place] [address] [place], [place] [address] [phone]

## 2019-11-26 ENCOUNTER — Ambulatory Visit: Payer: Medicaid Other | Admitting: Physician Assistant

## 2019-11-27 ENCOUNTER — Encounter: Payer: Self-pay | Admitting: Physician Assistant

## 2019-12-17 ENCOUNTER — Other Ambulatory Visit: Payer: Self-pay | Admitting: Family Medicine

## 2019-12-17 DIAGNOSIS — F418 Other specified anxiety disorders: Secondary | ICD-10-CM

## 2019-12-17 DIAGNOSIS — Z515 Encounter for palliative care: Secondary | ICD-10-CM

## 2019-12-17 MED ORDER — LORAZEPAM 1 MG PO TABS: 1.0000 mg | ORAL_TABLET | Freq: Three times a day (TID) | ORAL | 2 refills | Status: DC | PRN

## 2019-12-17 NOTE — Telephone Encounter (Signed)
PT is out of her meds  LORazepam (ATIVAN) 1 MG tablet [[id number]]  [facility] [address]), [place] [address]  [place] ([place]) [place] [address]  Phone: [phone] Fax: [phone]

## 2019-12-17 NOTE — Telephone Encounter (Signed)
Requested medication (s) are due for refill today: yes  Requested medication (s) are on the active medication list: yes  Last refill:  09/14/19  Future visit scheduled: no  Notes to clinic:  not delegated    Requested Prescriptions  Pending Prescriptions Disp Refills   LORazepam (ATIVAN) 1 MG tablet 105 tablet 2    Sig: Take 1 tablet (1 mg total) by mouth 3 (three) times daily as needed for anxiety. May take extra 0.5mg  (half tablet) if wake up overnight, for anxiety      Not Delegated - Psychiatry:  Anxiolytics/Hypnotics Failed - 12/17/2019  8:27 AM      Failed - This refill cannot be delegated      Failed - Urine Drug Screen completed in last 360 days.      Passed - Valid encounter within last 6 months    Recent Outpatient Visits           4 months ago Anxiety associated with depression   [place], DO   7 months ago Uncontrolled type 2 diabetes with neuropathy [facility])   [place], DO   9 months ago Anxiety associated with depression   [facility] Olin Hauser, DO   1 year ago Anxiety associated with depression   [place], DO   1 year ago Anxiety associated with depression   [place], DO       Future Appointments             In 69 months [facility], [facility]

## 2019-12-17 NOTE — Addendum Note (Signed)
Addended by: [place] Naegeli on: 12/17/2019 08:27 AM   Modules accepted: Orders

## 2020-01-08 ENCOUNTER — Other Ambulatory Visit: Payer: Self-pay | Admitting: Family Medicine

## 2020-01-08 DIAGNOSIS — IMO0002 Reserved for concepts with insufficient information to code with codable children: Secondary | ICD-10-CM

## 2020-01-08 NOTE — Telephone Encounter (Signed)
Please notify patient that she is overdue for diabetes follow-up with [facility].  She will need to see them for further management.  Ask if she has enough Novolog until she can get an appointment.  If she does not have enough, clarify her current dose, let me know we can do a temporary order until she can see Endocrinology.  Nobie Putnam, [facility] 01/08/2020, 6:02 PM

## 2020-01-10 MED ORDER — INSULIN ASPART 100 UNIT/ML ~~LOC~~ SOLN: 35.0000 [IU] | Freq: Three times a day (TID) | SUBCUTANEOUS | 0 refills | Status: DC

## 2020-01-10 MED ORDER — INSULIN GLARGINE 100 UNIT/ML ~~LOC~~ SOLN: 70.0000 [IU] | Freq: Two times a day (BID) | SUBCUTANEOUS | 0 refills | Status: DC

## 2020-01-10 NOTE — Telephone Encounter (Signed)
Sent rx temporary fill Novolog 35u TID wc +10 SSI Lantus 70u BID  Needs to schedule w/ Endocrinologist for future fills  Nobie Putnam, DO [facility] 01/10/2020, 11:56 AM

## 2020-01-10 NOTE — Telephone Encounter (Signed)
Spoke to the patient she will call [facility] to schedule an apt and taking same way as in past wants Dr Raliegh Ip to Rx meds.

## 2020-01-11 IMAGING — CT CT ABD-PELV W/ CM
2 of 5 series · 16 of 46 positions shown, 18 images · IV contrast (APPLIED)
Comparison: October 27, 2017

CLINICAL DATA: Bilateral lower quadrant abdomen pain.

EXAM:
CT ABDOMEN AND PELVIS WITH CONTRAST
TECHNIQUE: Multidetector CT imaging of the abdomen and pelvis was performed
using the standard protocol following bolus administration of
intravenous contrast.
CONTRAST:  100mL OMNIPAQUE IOHEXOL 300 MG/ML  SOLN

[Series 2: routine abd/pel with · axial · 0.76mm/px · z∈[-1108,-723]mm · 13 of 89 slices shown, 15 images]
[im 6/89  soft-tissue]
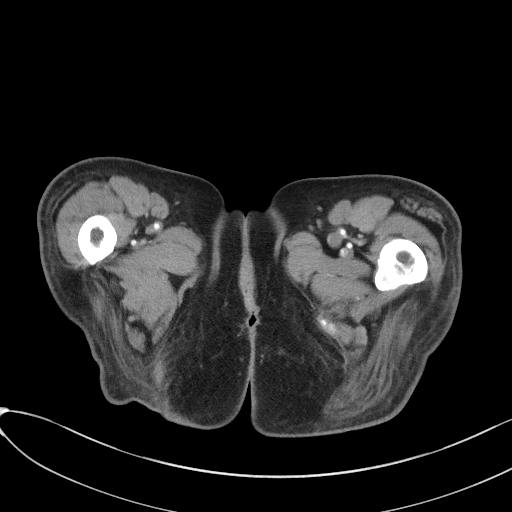
[im 6/89  bone]
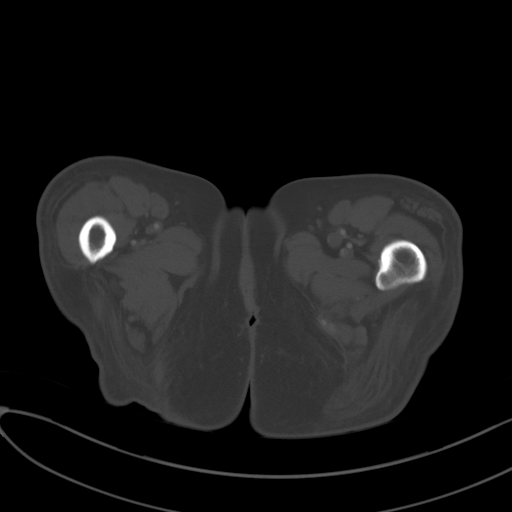
[im 12/89  soft-tissue]
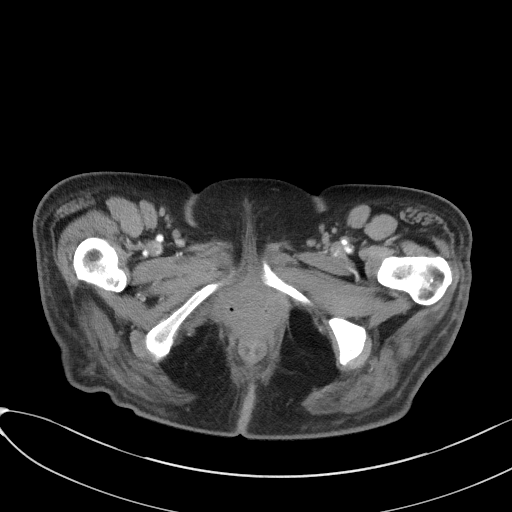
[im 17/89  soft-tissue]
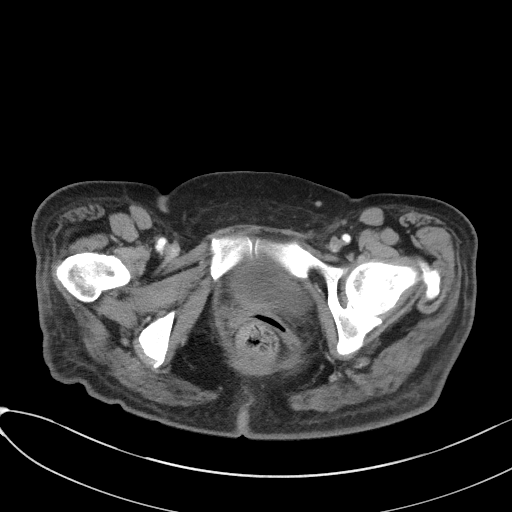
[im 28/89  soft-tissue]
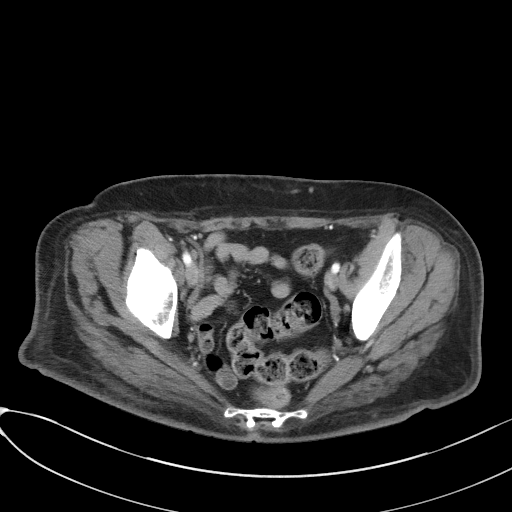
[im 34/89  soft-tissue]
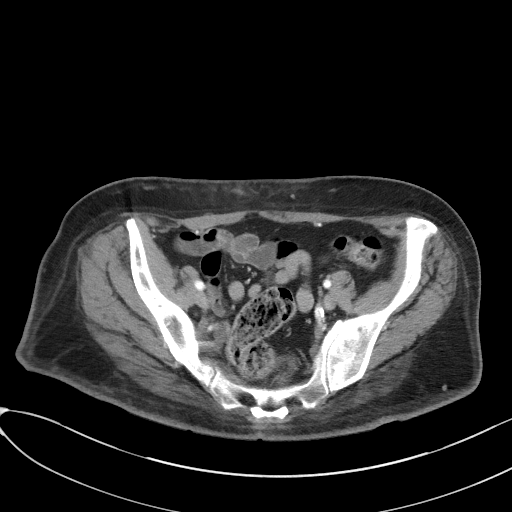
[im 39/89  soft-tissue]
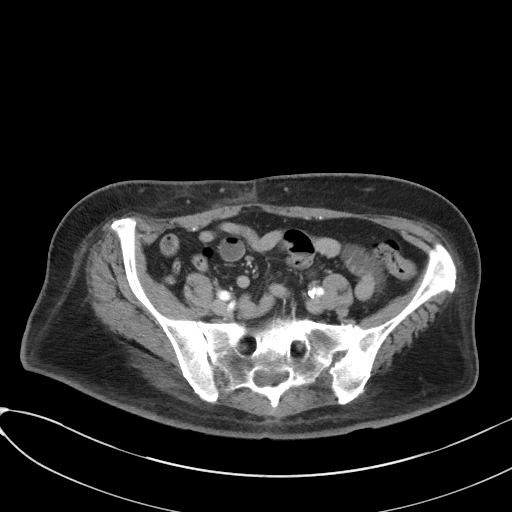
[im 45/89  soft-tissue]
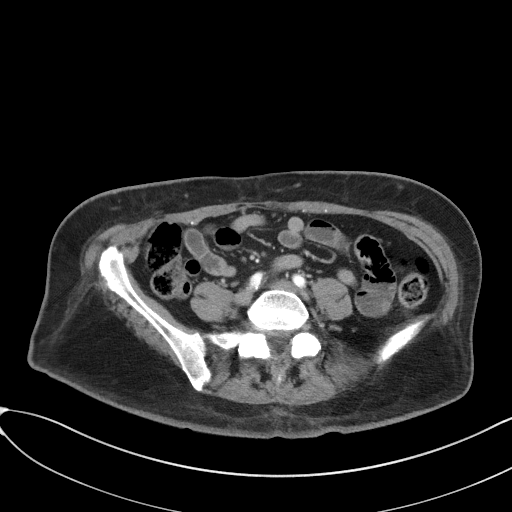
[im 50/89  soft-tissue]
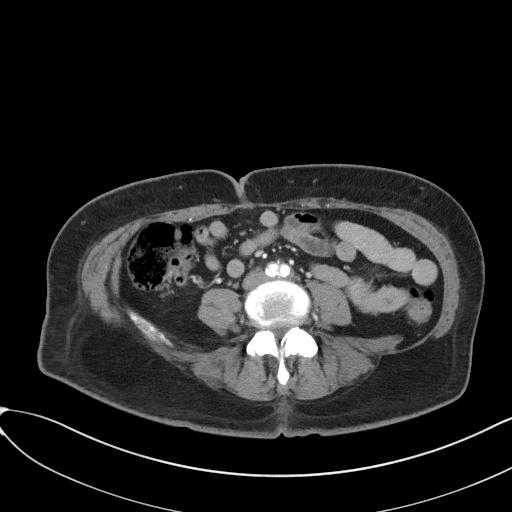
[im 56/89  soft-tissue]
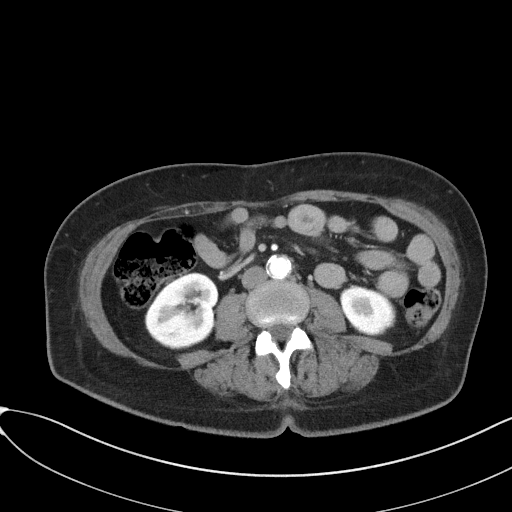
[im 56/89  bone]
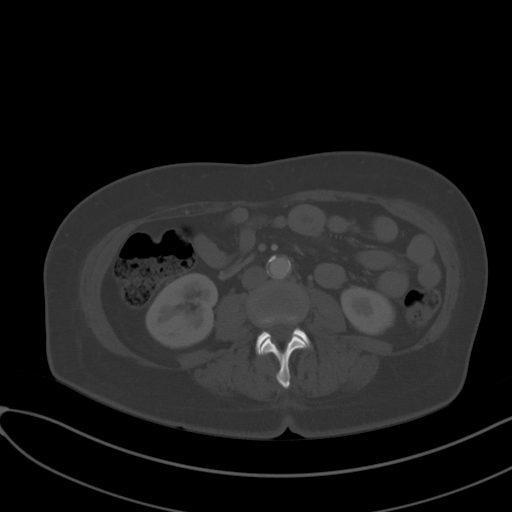
[im 61/89  soft-tissue]
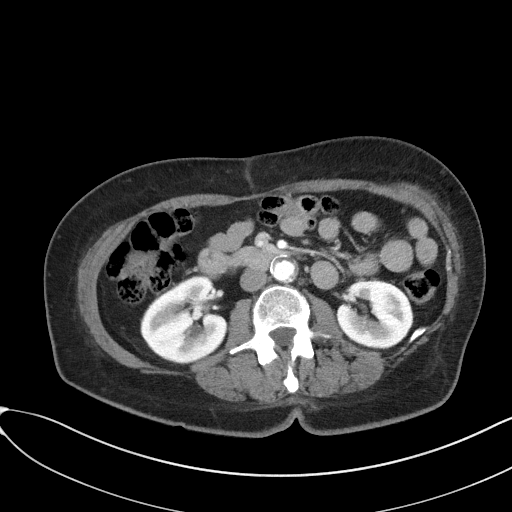
[im 72/89  soft-tissue]
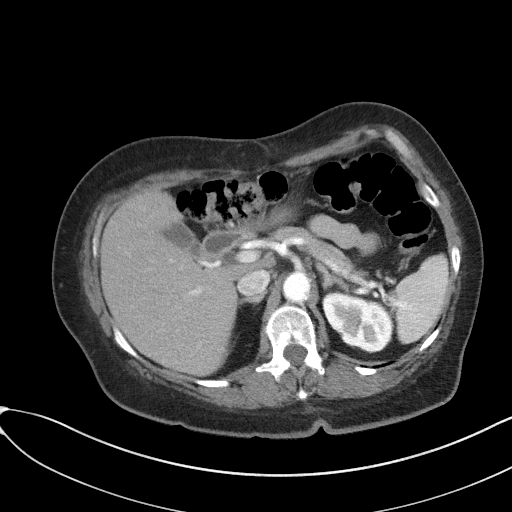
[im 78/89  soft-tissue]
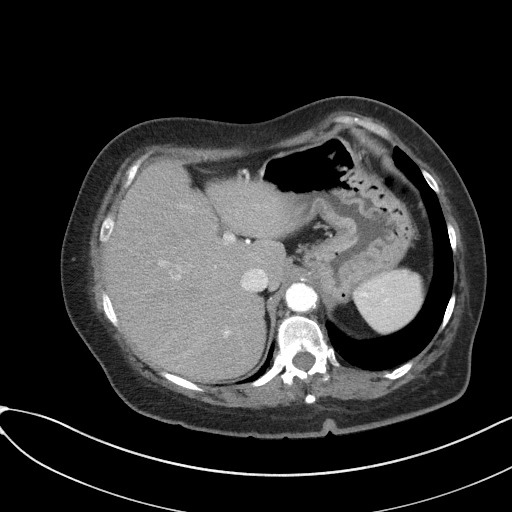
[im 83/89  soft-tissue]
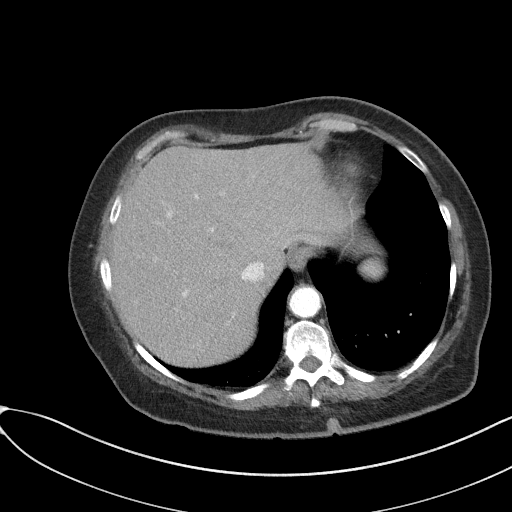

[Series 5: coronal st · coronal · 0.78mm/px · 3 of 91 slices shown]
[im 31/91  soft-tissue]
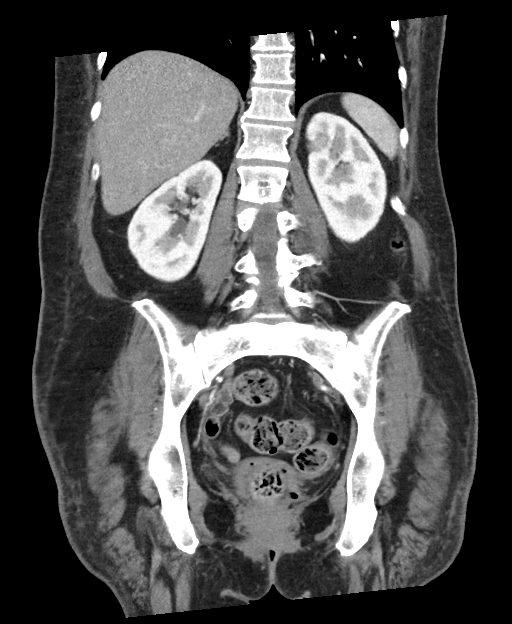
[im 41/91  soft-tissue]
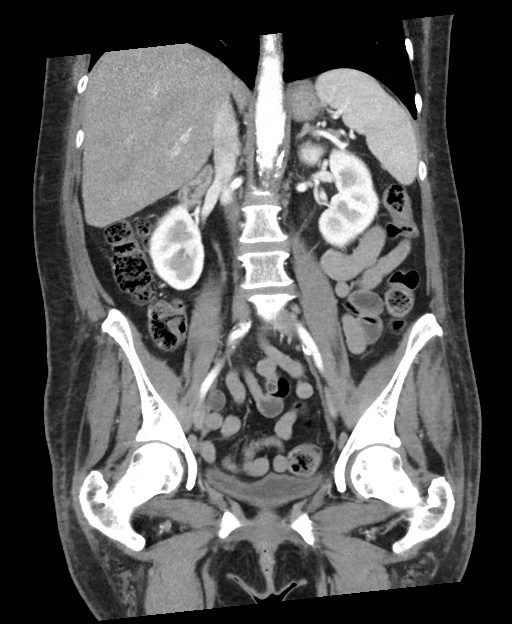
[im 51/91  soft-tissue]
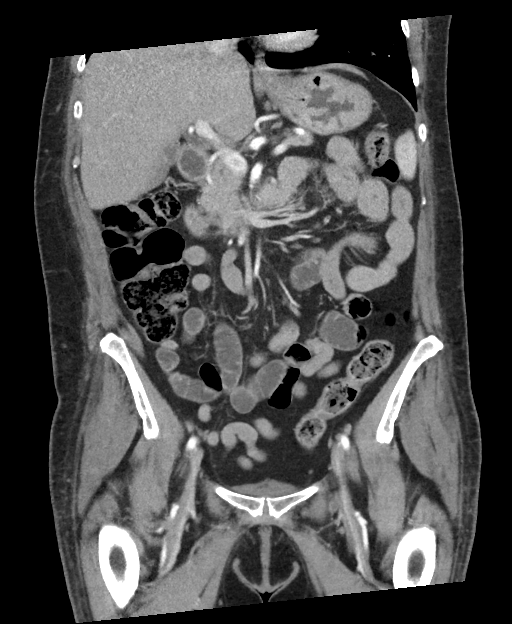

[16 of 46 positions shown; findings below may reference images not displayed]

FINDINGS: Lower chest: No acute abnormality.

Hepatobiliary: There is mild diffuse low density of the liver
without vessel displacement. No focal liver lesion is identified.
The gallbladder is normal. There is dilatation of the common bowel
duct measuring 8.5 mm unchanged compared prior CT.

Pancreas: Unremarkable. No pancreatic ductal dilatation or
surrounding inflammatory changes.

Spleen: Normal in size without focal abnormality.

Adrenals/Urinary Tract: The adrenal glands are normal. There is
focal scar of the upper pole left kidney. No renal lesion is
identified. There is no hydronephrosis bilaterally. The bladder is
partially decompressed without focal abnormality.

Stomach/Bowel: There is a small hiatal hernia. The stomach is
otherwise normal. There is no small bowel obstruction or
diverticulitis. The appendix is normal. There is focal anterior
herniation of the transverse colon without evidence of incarceration
unchanged compared to prior CT.

Vascular/Lymphatic: Aortic atherosclerosis. No enlarged abdominal or
pelvic lymph nodes.

Reproductive: Status post hysterectomy. No adnexal masses.

Other: Umbilical herniation of mesenteric fat is identified.

Musculoskeletal: No acute or significant osseous findings.
IMPRESSION: No bowel obstruction. No evidence of diverticulitis. Focal
herniation of transverse colon in the midline anterior abdomen
without evidence of incarceration, unchanged compared prior CT.

Fatty infiltration of liver.

Focal scar of left kidney.

## 2020-01-15 ENCOUNTER — Other Ambulatory Visit: Payer: Self-pay | Admitting: Family Medicine

## 2020-01-15 DIAGNOSIS — K219 Gastro-esophageal reflux disease without esophagitis: Secondary | ICD-10-CM

## 2020-01-15 MED ORDER — ESOMEPRAZOLE MAGNESIUM 40 MG PO CPDR: 40.0000 mg | DELAYED_RELEASE_CAPSULE | Freq: Every day | ORAL | 0 refills | Status: DC

## 2020-01-15 NOTE — Telephone Encounter (Signed)
PT need a refill  esomeprazole (NEXIUM) 40 MG capsule [[id number]] [place] (N), [place] - [address]  [place] ([place]) [place] [address]  Phone: [phone] Fax: [phone]

## 2020-02-01 IMAGING — CR DG CHEST 2V
2 series · 2 of 2 positions shown · non-contrast
Comparison: Chest radiograph performed 01/15/2018

CLINICAL DATA: Acute onset of substernal chest pain, chest
pressure, and neck, arm and back pressure. Nausea and shortness of
breath.

EXAM:
CHEST - 2 VIEW

[chest pa]
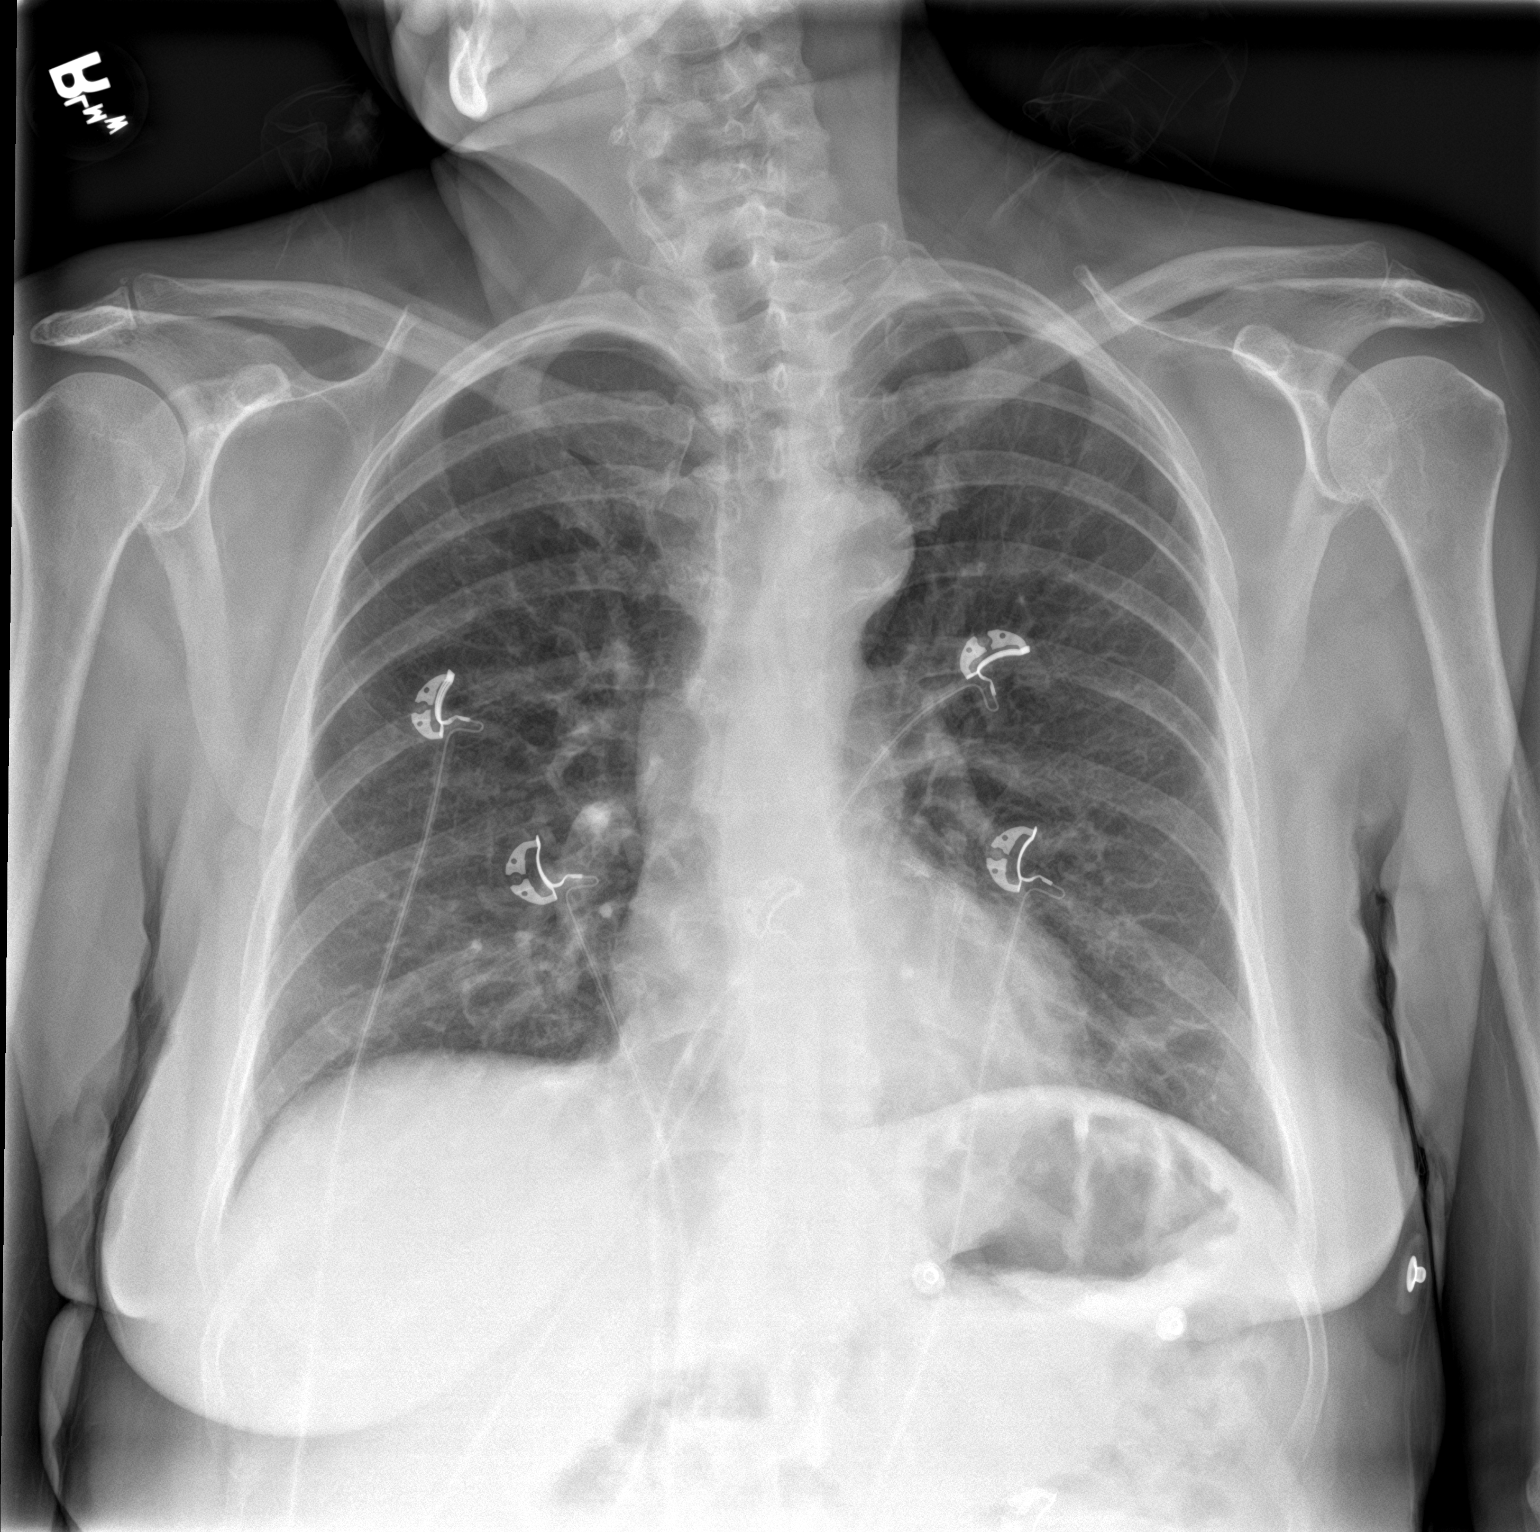

[chest lat]
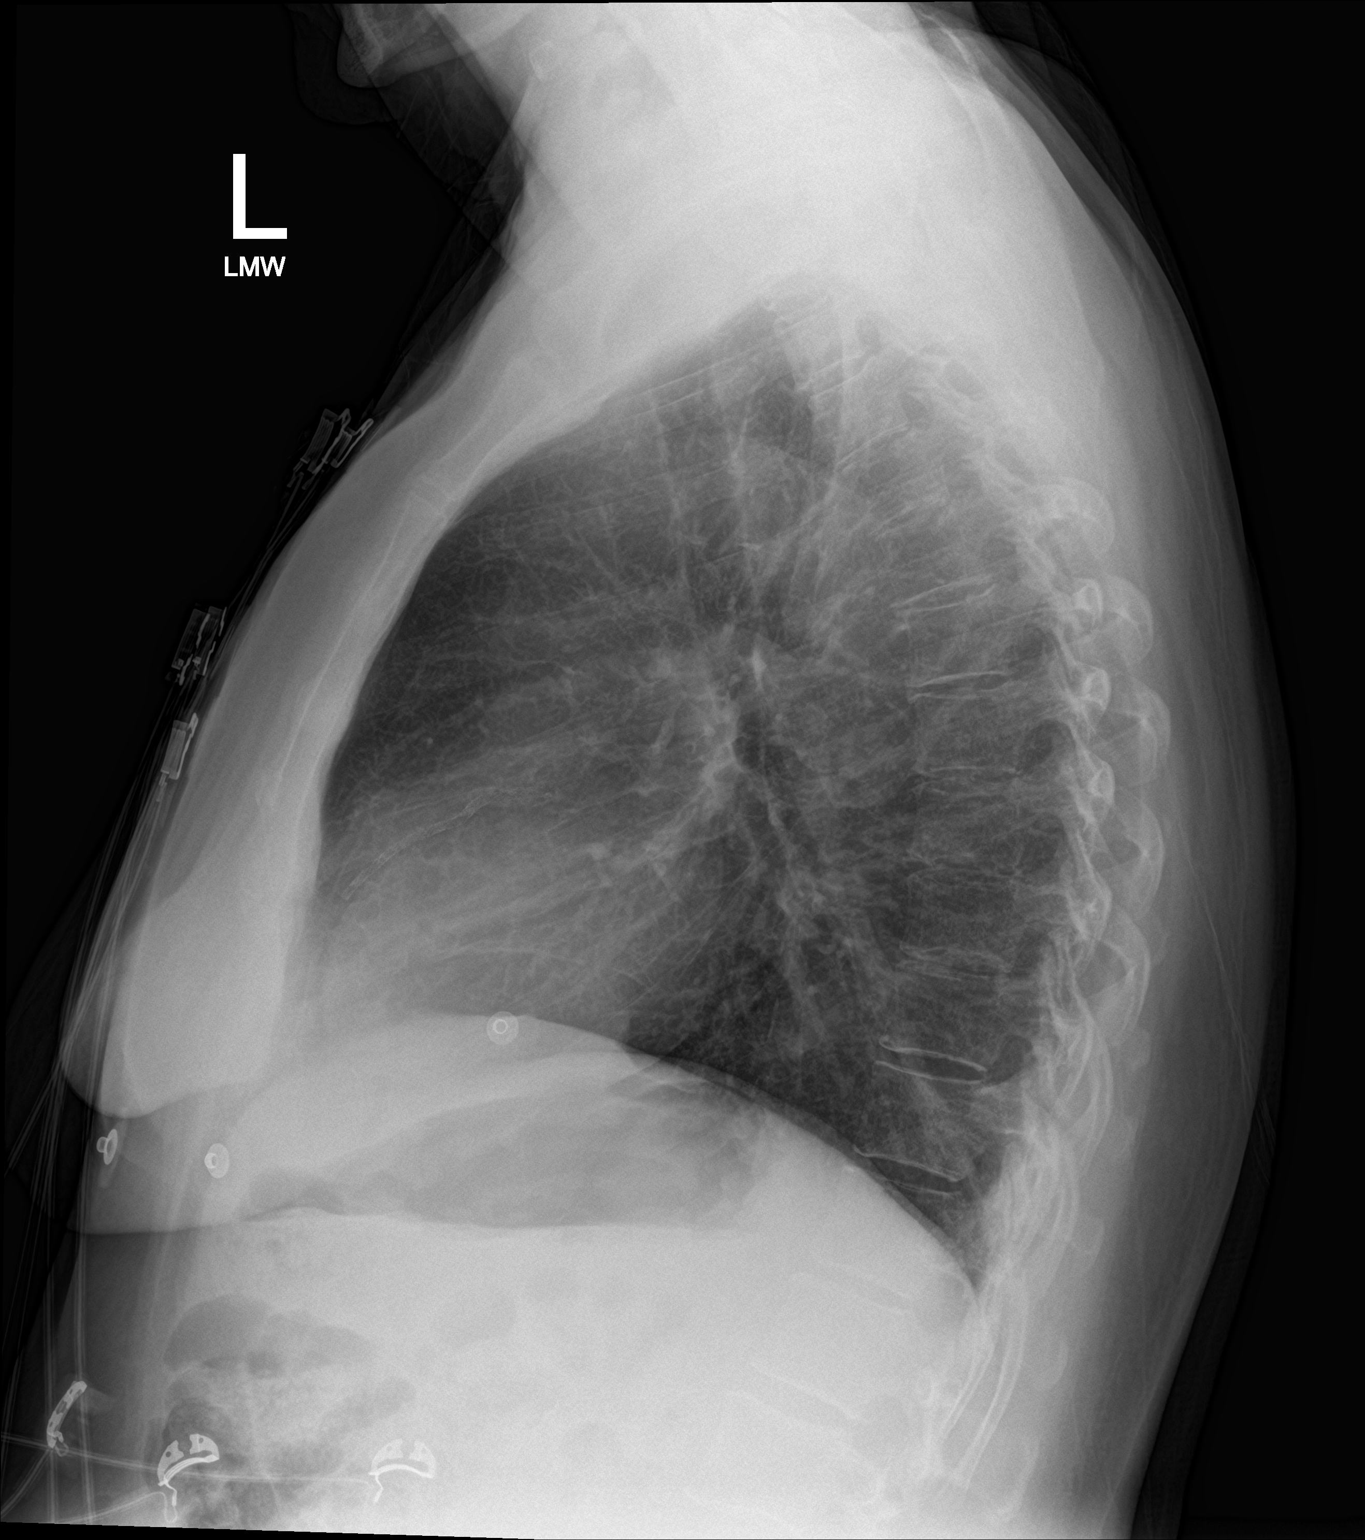

[2 of 2 positions shown; findings below may reference images not displayed]

FINDINGS: The lungs are well-aerated. Minimal left basilar atelectasis is
noted. There is no evidence of pleural effusion or pneumothorax.

The heart is normal in size; the mediastinal contour is within
normal limits. No acute osseous abnormalities are seen.
IMPRESSION: Minimal left basilar atelectasis noted; lungs otherwise clear.

## 2020-03-30 ENCOUNTER — Inpatient Hospital Stay
Admission: EM | Admit: 2020-03-30 | Discharge: 2020-04-05 | DRG: 637 | Disposition: A | Discharge status: Home Health | Payer: Medicare Other | Attending: Family Medicine | Admitting: Family Medicine

## 2020-03-30 ENCOUNTER — Emergency Department: Payer: Medicare Other

## 2020-03-30 ENCOUNTER — Other Ambulatory Visit: Payer: Self-pay

## 2020-03-30 ENCOUNTER — Inpatient Hospital Stay: Payer: Medicare Other

## 2020-03-30 DIAGNOSIS — R911 Solitary pulmonary nodule: Secondary | ICD-10-CM | POA: Diagnosis present

## 2020-03-30 DIAGNOSIS — M5090 Cervical disc disorder, unspecified, unspecified cervical region: Secondary | ICD-10-CM | POA: Diagnosis present

## 2020-03-30 DIAGNOSIS — G893 Neoplasm related pain (acute) (chronic): Secondary | ICD-10-CM | POA: Diagnosis present

## 2020-03-30 DIAGNOSIS — K219 Gastro-esophageal reflux disease without esophagitis: Secondary | ICD-10-CM | POA: Diagnosis present

## 2020-03-30 DIAGNOSIS — E876 Hypokalemia: Secondary | ICD-10-CM | POA: Diagnosis present

## 2020-03-30 DIAGNOSIS — Z91041 Radiographic dye allergy status: Secondary | ICD-10-CM

## 2020-03-30 DIAGNOSIS — Z885 Allergy status to narcotic agent status: Secondary | ICD-10-CM

## 2020-03-30 DIAGNOSIS — I110 Hypertensive heart disease with heart failure: Secondary | ICD-10-CM | POA: Diagnosis present

## 2020-03-30 DIAGNOSIS — E1169 Type 2 diabetes mellitus with other specified complication: Secondary | ICD-10-CM | POA: Diagnosis present

## 2020-03-30 DIAGNOSIS — Z20822 Contact with and (suspected) exposure to covid-19: Secondary | ICD-10-CM | POA: Diagnosis present

## 2020-03-30 DIAGNOSIS — R112 Nausea with vomiting, unspecified: Secondary | ICD-10-CM | POA: Diagnosis not present

## 2020-03-30 DIAGNOSIS — J432 Centrilobular emphysema: Secondary | ICD-10-CM | POA: Diagnosis present

## 2020-03-30 DIAGNOSIS — I252 Old myocardial infarction: Secondary | ICD-10-CM

## 2020-03-30 DIAGNOSIS — I255 Ischemic cardiomyopathy: Secondary | ICD-10-CM | POA: Diagnosis present

## 2020-03-30 DIAGNOSIS — Z888 Allergy status to other drugs, medicaments and biological substances status: Secondary | ICD-10-CM

## 2020-03-30 DIAGNOSIS — E861 Hypovolemia: Secondary | ICD-10-CM | POA: Diagnosis present

## 2020-03-30 DIAGNOSIS — R7989 Other specified abnormal findings of blood chemistry: Secondary | ICD-10-CM

## 2020-03-30 DIAGNOSIS — I2510 Atherosclerotic heart disease of native coronary artery without angina pectoris: Secondary | ICD-10-CM | POA: Diagnosis present

## 2020-03-30 DIAGNOSIS — E1110 Type 2 diabetes mellitus with ketoacidosis without coma: Secondary | ICD-10-CM | POA: Diagnosis present

## 2020-03-30 DIAGNOSIS — D071 Carcinoma in situ of vulva: Secondary | ICD-10-CM | POA: Diagnosis present

## 2020-03-30 DIAGNOSIS — Z794 Long term (current) use of insulin: Secondary | ICD-10-CM

## 2020-03-30 DIAGNOSIS — I5042 Chronic combined systolic (congestive) and diastolic (congestive) heart failure: Secondary | ICD-10-CM | POA: Diagnosis present

## 2020-03-30 DIAGNOSIS — Z955 Presence of coronary angioplasty implant and graft: Secondary | ICD-10-CM

## 2020-03-30 DIAGNOSIS — K859 Acute pancreatitis without necrosis or infection, unspecified: Secondary | ICD-10-CM

## 2020-03-30 DIAGNOSIS — Z8249 Family history of ischemic heart disease and other diseases of the circulatory system: Secondary | ICD-10-CM

## 2020-03-30 DIAGNOSIS — J189 Pneumonia, unspecified organism: Secondary | ICD-10-CM | POA: Diagnosis present

## 2020-03-30 DIAGNOSIS — Z91030 Bee allergy status: Secondary | ICD-10-CM

## 2020-03-30 DIAGNOSIS — Z7982 Long term (current) use of aspirin: Secondary | ICD-10-CM

## 2020-03-30 DIAGNOSIS — N179 Acute kidney failure, unspecified: Secondary | ICD-10-CM | POA: Diagnosis present

## 2020-03-30 DIAGNOSIS — Z79891 Long term (current) use of opiate analgesic: Secondary | ICD-10-CM

## 2020-03-30 DIAGNOSIS — Z9081 Acquired absence of spleen: Secondary | ICD-10-CM

## 2020-03-30 DIAGNOSIS — E785 Hyperlipidemia, unspecified: Secondary | ICD-10-CM | POA: Diagnosis present

## 2020-03-30 DIAGNOSIS — E860 Dehydration: Secondary | ICD-10-CM | POA: Diagnosis present

## 2020-03-30 DIAGNOSIS — Z23 Encounter for immunization: Secondary | ICD-10-CM | POA: Diagnosis not present

## 2020-03-30 DIAGNOSIS — Z79899 Other long term (current) drug therapy: Secondary | ICD-10-CM

## 2020-03-30 DIAGNOSIS — Z87891 Personal history of nicotine dependence: Secondary | ICD-10-CM

## 2020-03-30 DIAGNOSIS — C519 Malignant neoplasm of vulva, unspecified: Secondary | ICD-10-CM | POA: Diagnosis not present

## 2020-03-30 DIAGNOSIS — I959 Hypotension, unspecified: Secondary | ICD-10-CM | POA: Diagnosis present

## 2020-03-30 LAB — COMPREHENSIVE METABOLIC PANEL
ALT: 7 U/L (ref 0–44)
AST: 16 U/L (ref 15–41)
Albumin: 4 g/dL (ref 3.5–5.0)
Alkaline Phosphatase: 94 U/L (ref 38–126)
Anion gap: 27 — ABNORMAL HIGH (ref 5–15)
BUN: 47 mg/dL — ABNORMAL HIGH (ref 6–20)
CO2: 8 mmol/L — ABNORMAL LOW (ref 22–32)
Calcium: 9 mg/dL (ref 8.9–10.3)
Chloride: 89 mmol/L — ABNORMAL LOW (ref 98–111)
Creatinine, Ser: 2.2 mg/dL — ABNORMAL HIGH (ref 0.44–1.00)
GFR, Estimated: 24 mL/min — ABNORMAL LOW (ref >60)
Glucose, Bld: 864 mg/dL (ref 70–99)
Potassium: 5 mmol/L (ref 3.5–5.1)
Sodium: 124 mmol/L — ABNORMAL LOW (ref 135–145)
Total Bilirubin: 3.5 mg/dL — ABNORMAL HIGH (ref 0.3–1.2)
Total Protein: 8.8 g/dL — ABNORMAL HIGH (ref 6.5–8.1)

## 2020-03-30 LAB — CBC WITH DIFFERENTIAL/PLATELET
Abs Immature Granulocytes: 0.47 10*3/uL — ABNORMAL HIGH (ref 0.00–0.07)
Basophils Absolute: 0.1 10*3/uL (ref 0.0–0.1)
Basophils Relative: 0 %
Eosinophils Absolute: 4.5 10*3/uL — ABNORMAL HIGH (ref 0.0–0.5)
Eosinophils Relative: 20 %
HCT: 55.1 % — ABNORMAL HIGH (ref 36.0–46.0)
Hemoglobin: 17.6 g/dL — ABNORMAL HIGH (ref 12.0–15.0)
Immature Granulocytes: 2 %
Lymphocytes Relative: 7 %
Lymphs Abs: 1.5 10*3/uL (ref 0.7–4.0)
MCH: 31.8 pg (ref 26.0–34.0)
MCHC: 31.9 g/dL (ref 30.0–36.0)
MCV: 99.5 fL (ref 80.0–100.0)
Monocytes Absolute: 1.5 10*3/uL — ABNORMAL HIGH (ref 0.1–1.0)
Monocytes Relative: 6 %
Neutro Abs: 14.6 10*3/uL — ABNORMAL HIGH (ref 1.7–7.7)
Neutrophils Relative %: 65 %
Platelets: 353 10*3/uL (ref 150–400)
RBC: 5.54 MIL/uL — ABNORMAL HIGH (ref 3.87–5.11)
RDW: 13.8 % (ref 11.5–15.5)
WBC: 22.6 10*3/uL — ABNORMAL HIGH (ref 4.0–10.5)
nRBC: 0 % (ref 0.0–0.2)

## 2020-03-30 LAB — URINALYSIS, COMPLETE (UACMP) WITH MICROSCOPIC
Bacteria, UA: NONE SEEN
Bilirubin Urine: NEGATIVE
Glucose, UA: >=500 mg/dL — AB
Ketones, ur: 80 mg/dL — AB
Leukocytes,Ua: NEGATIVE
Nitrite: NEGATIVE
Protein, ur: 100 mg/dL — AB
Specific Gravity, Urine: 1.021 (ref 1.005–1.030)
pH: 5 (ref 5.0–8.0)

## 2020-03-30 LAB — GLUCOSE, CAPILLARY
Glucose-Capillary: >600 mg/dL (ref 70–99)
Glucose-Capillary: >600 mg/dL (ref 70–99)
Glucose-Capillary: >600 mg/dL (ref 70–99)
Glucose-Capillary: >600 mg/dL (ref 70–99)
Glucose-Capillary: 572 mg/dL (ref 70–99)
Glucose-Capillary: 518 mg/dL (ref 70–99)
Glucose-Capillary: 504 mg/dL (ref 70–99)
Glucose-Capillary: 362 mg/dL — ABNORMAL HIGH (ref 70–99)
Glucose-Capillary: 407 mg/dL — ABNORMAL HIGH (ref 70–99)
Glucose-Capillary: 409 mg/dL — ABNORMAL HIGH (ref 70–99)
Glucose-Capillary: 302 mg/dL — ABNORMAL HIGH (ref 70–99)
Glucose-Capillary: 249 mg/dL — ABNORMAL HIGH (ref 70–99)
Glucose-Capillary: 230 mg/dL — ABNORMAL HIGH (ref 70–99)
Glucose-Capillary: 219 mg/dL — ABNORMAL HIGH (ref 70–99)
Glucose-Capillary: 212 mg/dL — ABNORMAL HIGH (ref 70–99)
Glucose-Capillary: 206 mg/dL — ABNORMAL HIGH (ref 70–99)
Glucose-Capillary: 221 mg/dL — ABNORMAL HIGH (ref 70–99)
Glucose-Capillary: 258 mg/dL — ABNORMAL HIGH (ref 70–99)

## 2020-03-30 LAB — BASIC METABOLIC PANEL
Anion gap: 27 — ABNORMAL HIGH (ref 5–15)
Anion gap: 11 (ref 5–15)
Anion gap: 11 (ref 5–15)
BUN: 46 mg/dL — ABNORMAL HIGH (ref 6–20)
BUN: 62 mg/dL — ABNORMAL HIGH (ref 6–20)
BUN: 59 mg/dL — ABNORMAL HIGH (ref 6–20)
CO2: 8 mmol/L — ABNORMAL LOW (ref 22–32)
CO2: 17 mmol/L — ABNORMAL LOW (ref 22–32)
CO2: 18 mmol/L — ABNORMAL LOW (ref 22–32)
Calcium: 8.9 mg/dL (ref 8.9–10.3)
Calcium: 9.3 mg/dL (ref 8.9–10.3)
Calcium: 9.4 mg/dL (ref 8.9–10.3)
Chloride: 92 mmol/L — ABNORMAL LOW (ref 98–111)
Chloride: 102 mmol/L (ref 98–111)
Chloride: 104 mmol/L (ref 98–111)
Creatinine, Ser: 1.93 mg/dL — ABNORMAL HIGH (ref 0.44–1.00)
Creatinine, Ser: 1.09 mg/dL — ABNORMAL HIGH (ref 0.44–1.00)
Creatinine, Ser: 1.11 mg/dL — ABNORMAL HIGH (ref 0.44–1.00)
GFR, Estimated: 28 mL/min — ABNORMAL LOW (ref >60)
GFR, Estimated: 57 mL/min — ABNORMAL LOW (ref >60)
GFR, Estimated: 55 mL/min — ABNORMAL LOW (ref >60)
Glucose, Bld: 787 mg/dL (ref 70–99)
Glucose, Bld: 245 mg/dL — ABNORMAL HIGH (ref 70–99)
Glucose, Bld: 215 mg/dL — ABNORMAL HIGH (ref 70–99)
Potassium: 4.7 mmol/L (ref 3.5–5.1)
Potassium: 3.6 mmol/L (ref 3.5–5.1)
Potassium: 3.4 mmol/L — ABNORMAL LOW (ref 3.5–5.1)
Sodium: 127 mmol/L — ABNORMAL LOW (ref 135–145)
Sodium: 130 mmol/L — ABNORMAL LOW (ref 135–145)
Sodium: 133 mmol/L — ABNORMAL LOW (ref 135–145)

## 2020-03-30 LAB — BLOOD GAS, VENOUS
Acid-base deficit: 24.8 mmol/L — ABNORMAL HIGH (ref 0.0–2.0)
Bicarbonate: 6 mmol/L — ABNORMAL LOW (ref 20.0–28.0)
O2 Saturation: 50.8 %
Patient temperature: 37
pCO2, Ven: 26 mmHg — ABNORMAL LOW (ref 44.0–60.0)
pH, Ven: 6.97 — CL (ref 7.250–7.430)
pO2, Ven: 45 mmHg (ref 32.0–45.0)

## 2020-03-30 LAB — RESPIRATORY PANEL BY RT PCR (FLU A&B, COVID)
Influenza A by PCR: NEGATIVE
Influenza B by PCR: NEGATIVE
SARS Coronavirus 2 by RT PCR: NEGATIVE

## 2020-03-30 LAB — HIV ANTIBODY (ROUTINE TESTING W REFLEX): HIV Screen 4th Generation wRfx: NONREACTIVE

## 2020-03-30 LAB — PROTIME-INR
INR: 1.2 (ref 0.8–1.2)
Prothrombin Time: 15 seconds (ref 11.4–15.2)

## 2020-03-30 LAB — MAGNESIUM
Magnesium: 2.4 mg/dL (ref 1.7–2.4)
Magnesium: 2.3 mg/dL (ref 1.7–2.4)

## 2020-03-30 LAB — LIPASE, BLOOD: Lipase: 631 U/L — ABNORMAL HIGH (ref 11–51)

## 2020-03-30 LAB — LACTIC ACID, PLASMA
Lactic Acid, Venous: 2.6 mmol/L (ref 0.5–1.9)
Lactic Acid, Venous: 1.5 mmol/L (ref 0.5–1.9)

## 2020-03-30 LAB — BETA-HYDROXYBUTYRIC ACID
Beta-Hydroxybutyric Acid: >8 mmol/L — ABNORMAL HIGH (ref 0.05–0.27)
Beta-Hydroxybutyric Acid: 0.37 mmol/L — ABNORMAL HIGH (ref 0.05–0.27)
Beta-Hydroxybutyric Acid: 0.48 mmol/L — ABNORMAL HIGH (ref 0.05–0.27)

## 2020-03-30 LAB — PHOSPHORUS: Phosphorus: 5.8 mg/dL — ABNORMAL HIGH (ref 2.5–4.6)

## 2020-03-30 LAB — PROCALCITONIN: Procalcitonin: 0.38 ng/mL

## 2020-03-30 MED ORDER — POTASSIUM CHLORIDE 10 MEQ/100ML IV SOLN
10.0000 meq | INTRAVENOUS | Status: AC
  Administered 2020-03-30 – 2020-03-31 (×4): 10 meq via INTRAVENOUS
  Filled 2020-03-30 (×4): qty 100

## 2020-03-30 MED ORDER — MORPHINE SULFATE (PF) 4 MG/ML IV SOLN
4.0000 mg | Freq: Once | INTRAVENOUS | Status: AC
  Administered 2020-03-30: 4 mg via INTRAVENOUS
  Filled 2020-03-30: qty 1

## 2020-03-30 MED ORDER — FAMOTIDINE IN NACL 20-0.9 MG/50ML-% IV SOLN
20.0000 mg | INTRAVENOUS | Status: DC
  Administered 2020-03-30 – 2020-03-31 (×2): 20 mg via INTRAVENOUS
  Filled 2020-03-30 (×3): qty 50

## 2020-03-30 MED ORDER — METHOCARBAMOL 500 MG PO TABS
500.0000 mg | ORAL_TABLET | Freq: Four times a day (QID) | ORAL | Status: DC
  Filled 2020-03-30 (×2): qty 1

## 2020-03-30 MED ORDER — HEPARIN SODIUM (PORCINE) 5000 UNIT/ML IJ SOLN
5000.0000 [IU] | Freq: Three times a day (TID) | INTRAMUSCULAR | Status: DC
  Administered 2020-03-30 – 2020-04-05 (×19): 5000 [IU] via SUBCUTANEOUS
  Filled 2020-03-30 (×19): qty 1

## 2020-03-30 MED ORDER — DEXTROSE 50 % IV SOLN
0.0000 mL | INTRAVENOUS | Status: DC | PRN
  Administered 2020-04-01: 50 mL via INTRAVENOUS
  Filled 2020-03-30: qty 50

## 2020-03-30 MED ORDER — SODIUM CHLORIDE 0.9 % IV SOLN: INTRAVENOUS | Status: DC

## 2020-03-30 MED ORDER — INSULIN ASPART 100 UNIT/ML ~~LOC~~ SOLN
0.0000 [IU] | Freq: Every day | SUBCUTANEOUS | Status: DC
  Administered 2020-03-30: 2 [IU] via SUBCUTANEOUS
  Filled 2020-03-30: qty 1

## 2020-03-30 MED ORDER — INSULIN ASPART 100 UNIT/ML ~~LOC~~ SOLN: 0.0000 [IU] | Freq: Three times a day (TID) | SUBCUTANEOUS | Status: DC

## 2020-03-30 MED ORDER — INSULIN REGULAR(HUMAN) IN NACL 100-0.9 UT/100ML-% IV SOLN
INTRAVENOUS | Status: DC
  Administered 2020-03-30: 7 [IU]/h via INTRAVENOUS
  Filled 2020-03-30 (×2): qty 100

## 2020-03-30 MED ORDER — INSULIN GLARGINE 100 UNIT/ML ~~LOC~~ SOLN
15.0000 [IU] | Freq: Two times a day (BID) | SUBCUTANEOUS | Status: DC
  Administered 2020-03-30: 15 [IU] via SUBCUTANEOUS
  Filled 2020-03-30 (×2): qty 0.15

## 2020-03-30 MED ORDER — CITALOPRAM HYDROBROMIDE 20 MG PO TABS
40.0000 mg | ORAL_TABLET | Freq: Every day | ORAL | Status: DC
  Administered 2020-03-31 – 2020-04-05 (×6): 40 mg via ORAL
  Filled 2020-03-30 (×7): qty 2

## 2020-03-30 MED ORDER — SODIUM BICARBONATE 8.4 % IV SOLN
100.0000 meq | Freq: Once | INTRAVENOUS | Status: AC
  Administered 2020-03-30: 100 meq via INTRAVENOUS
  Filled 2020-03-30: qty 50

## 2020-03-30 MED ORDER — SODIUM CHLORIDE 0.9 % IV SOLN
2.0000 g | INTRAVENOUS | Status: DC
  Administered 2020-03-30 – 2020-03-31 (×2): 2 g via INTRAVENOUS
  Filled 2020-03-30 (×2): qty 2

## 2020-03-30 MED ORDER — LACTATED RINGERS IV SOLN: INTRAVENOUS | Status: DC

## 2020-03-30 MED ORDER — AMITRIPTYLINE HCL 75 MG PO TABS
75.0000 mg | ORAL_TABLET | Freq: Every day | ORAL | Status: DC
  Administered 2020-03-31 – 2020-04-02 (×3): 75 mg via ORAL
  Filled 2020-03-30 (×4): qty 1

## 2020-03-30 MED ORDER — SENNA 8.6 MG PO TABS
1.0000 | ORAL_TABLET | Freq: Every day | ORAL | Status: DC
  Administered 2020-03-31 – 2020-04-03 (×4): 8.6 mg via ORAL
  Filled 2020-03-30 (×3): qty 1

## 2020-03-30 MED ORDER — DOCUSATE SODIUM 100 MG PO CAPS
100.0000 mg | ORAL_CAPSULE | Freq: Two times a day (BID) | ORAL | Status: DC | PRN
  Administered 2020-03-31: 100 mg via ORAL

## 2020-03-30 MED ORDER — HYDROMORPHONE HCL 1 MG/ML IJ SOLN
1.0000 mg | INTRAMUSCULAR | Status: AC
  Administered 2020-03-30: 1 mg via INTRAVENOUS
  Filled 2020-03-30: qty 1

## 2020-03-30 MED ORDER — POLYETHYLENE GLYCOL 3350 17 G PO PACK
17.0000 g | PACK | Freq: Every day | ORAL | Status: DC | PRN
  Administered 2020-04-03: 17 g via ORAL
  Filled 2020-03-30: qty 1

## 2020-03-30 MED ORDER — LACTATED RINGERS IV BOLUS (SEPSIS)
1000.0000 mL | Freq: Once | INTRAVENOUS | Status: AC
  Administered 2020-03-30: 1000 mL via INTRAVENOUS

## 2020-03-30 MED ORDER — ONDANSETRON HCL 4 MG/2ML IJ SOLN
4.0000 mg | Freq: Four times a day (QID) | INTRAMUSCULAR | Status: DC | PRN
  Administered 2020-03-30 – 2020-04-03 (×4): 4 mg via INTRAVENOUS
  Filled 2020-03-30 (×5): qty 2

## 2020-03-30 MED ORDER — DEXTROSE IN LACTATED RINGERS 5 % IV SOLN: INTRAVENOUS | Status: DC

## 2020-03-30 MED ORDER — DOCUSATE SODIUM 100 MG PO CAPS
100.0000 mg | ORAL_CAPSULE | Freq: Every day | ORAL | Status: DC
  Administered 2020-03-31 – 2020-04-03 (×4): 100 mg via ORAL
  Filled 2020-03-30 (×5): qty 1

## 2020-03-30 MED ORDER — POTASSIUM CHLORIDE 10 MEQ/100ML IV SOLN
10.0000 meq | INTRAVENOUS | Status: AC
  Administered 2020-03-30: 10 meq via INTRAVENOUS
  Filled 2020-03-30: qty 100

## 2020-03-30 MED ORDER — ALBUTEROL SULFATE HFA 108 (90 BASE) MCG/ACT IN AERS
1.0000 | INHALATION_SPRAY | Freq: Four times a day (QID) | RESPIRATORY_TRACT | Status: DC | PRN
  Filled 2020-03-30: qty 6.7

## 2020-03-30 MED ORDER — MORPHINE SULFATE (PF) 2 MG/ML IV SOLN
2.0000 mg | INTRAVENOUS | Status: DC | PRN
  Administered 2020-03-30: 2 mg via INTRAVENOUS
  Filled 2020-03-30: qty 1

## 2020-03-30 MED ORDER — MIRTAZAPINE 15 MG PO TABS
15.0000 mg | ORAL_TABLET | Freq: Every day | ORAL | Status: DC
  Administered 2020-03-31 – 2020-04-04 (×5): 15 mg via ORAL
  Filled 2020-03-30 (×5): qty 1

## 2020-03-30 MED ORDER — HYDROMORPHONE HCL 1 MG/ML IJ SOLN
1.0000 mg | INTRAMUSCULAR | Status: DC | PRN
  Administered 2020-03-30 – 2020-03-31 (×4): 1 mg via INTRAVENOUS
  Filled 2020-03-30 (×4): qty 1

## 2020-03-30 NOTE — Progress Notes (Signed)
PHARMACY CONSULT NOTE - FOLLOW UP  Pharmacy Consult for Electrolyte Monitoring and Replacement   Recent Labs: Potassium (mmol/L)  Date Value  03/30/2020 4.7  04/19/2014 4.2   Magnesium (mg/dL)  Date Value  03/30/2020 2.3   Calcium (mg/dL)  Date Value  03/30/2020 8.9   Calcium, Total (mg/dL)  Date Value  04/19/2014 9.3   Albumin (g/dL)  Date Value  03/30/2020 4.0  04/19/2014 3.8   Phosphorus (mg/dL)  Date Value  03/30/2020 5.8 (H)   Sodium (mmol/L)  Date Value  03/30/2020 127 (L)  04/19/2014 129 (L)    Assessment: Patient is a 54 y/o F with medical history including stage III vulvar intraepithelial neoplasia, pancreatitis, ischemic cardiomyopathy, diabetes, CHF admitted with diabetic ketoacidosis. Pharmacy has been consulted to assist with electrolyte monitor and replacement.   DKA treatment protocol has been initiated. Patient is receiving IV insulin + fluid resuscitation.   Scr 2.2 >> 1.93   Goal of Therapy:  Electrolytes WNL   Plan:  --Sodium improving, AKI improving.  --No additional electrolyte supplementation needed.  --Will recheck electrolytes Q4H.   Benita Gutter  03/30/2020 8:50 AM

## 2020-03-30 NOTE — ED Notes (Signed)
Pt transported to CT ?

## 2020-03-30 NOTE — ED Notes (Signed)
Family member remains at bedside. 

## 2020-03-30 NOTE — Progress Notes (Addendum)
PHARMACY CONSULT NOTE - FOLLOW UP  Pharmacy Consult for Electrolyte Monitoring and Replacement   Recent Labs: Potassium (mmol/L)  Date Value  03/30/2020 5.0  04/19/2014 4.2   Magnesium (mg/dL)  Date Value  03/30/2020 2.4   Calcium (mg/dL)  Date Value  03/30/2020 9.0   Calcium, Total (mg/dL)  Date Value  04/19/2014 9.3   Albumin (g/dL)  Date Value  03/30/2020 4.0  04/19/2014 3.8   Phosphorus (mg/dL)  Date Value  12/20/2017 3.4   Sodium (mmol/L)  Date Value  03/30/2020 124 (L)  04/19/2014 129 (L)     Assessment: 10/10 @ 0304: Electrolytes WNL   Goal of Therapy:  Electrolytes WNL   Plan:  No additional electrolyte supplementation needed.  Will recheck electrolytes Q4H.   Orene Desanctis ,PharmD Clinical Pharmacist 03/30/2020 4:34 AM

## 2020-03-30 NOTE — ED Notes (Signed)
Critical lab result lactic 2.6 reported to Dr. Karma Greaser

## 2020-03-30 NOTE — Progress Notes (Signed)
Pt alert/oriented and following commands.  Sinus tach on cardiac monitor hr low 120's other vss. Pt states she is having pain RN to administer dilaudid.  Transitioning pt off insulin gtt.  Will continue to monitor and assess pt.  Marda Stalker, [place] Pager [phone] (please enter 7 digits) PCCM Consult Pager [phone] (please enter 7 digits)

## 2020-03-30 NOTE — ED Notes (Signed)
Patient continues to rest. States she does not want to eat her lunch, doesn't have much of an appetite. Patient assisted to different position in bed. Denies need for anything at this time.

## 2020-03-30 NOTE — ED Notes (Signed)
Family member at bedside.

## 2020-03-30 NOTE — ED Notes (Signed)
Critical lab results VBG reported to Dr Karma Greaser

## 2020-03-30 NOTE — ED Notes (Signed)
Assumed care of pt at 1900. Pt repositioned in bed for comfort. Medicated per MAR. Pt alert and talking in small phrases with regular and unlabored breathing. Oriented to name and place. Blanket provided and lights dimmed for comfort.

## 2020-03-30 NOTE — ED Notes (Signed)
ICU NP at bedside.

## 2020-03-30 NOTE — ED Notes (Signed)
X-ray at bedside

## 2020-03-30 NOTE — Progress Notes (Signed)
Pharmacy Antibiotic Note  Renee Mack is a 54 y.o. female admitted on 03/30/2020. Pharmacy has been consulted for cefepime dosing.  Plan: Cefepime 2 g IV q24h for current renal function  Height: 5\' 1"  (154.9 cm) Weight: 56.7 kg (125 lb) IBW/kg (Calculated) : 47.8  Temp (24hrs), Avg:97 F (36.1 C), Min:97 F (36.1 C), Max:97 F (36.1 C)  Recent Labs  Lab 03/30/20 0304 03/30/20 0425  WBC 22.6*  --   CREATININE 2.20* 1.93*  LATICACIDVEN 2.6*  --     Estimated Creatinine Clearance: 24.6 mL/min (A) (by C-G formula based on SCr of 1.93 mg/dL (H)).    Allergies  Allergen Reactions  . Bee Venom Itching, Shortness Of Breath and Swelling  . Metformin And Related Shortness Of Breath and Swelling  . Darvon [Propoxyphene] Itching  . Gabapentin Swelling  . Nsaids Other (See Comments)    Ulcers   . Tramadol Hives  . Contrast Media [Iodinated Diagnostic Agents] Rash    If you benadryl, and steroids she is able to take the contrast per pt    Antimicrobials this admission: Cefepime 10/10 >>   Microbiology results: 10/10 BCx: NG 12hr 10/10 UCx: pending   Thank you for allowing pharmacy to be a part of this patient's care.  Tawnya Crook, PharmD 03/30/2020 7:18 AM

## 2020-03-30 NOTE — ED Notes (Signed)
Patient states she is a little sick on her stomach. Zofran given. Will continue to monitor.

## 2020-03-30 NOTE — H&P (Signed)
Name: Lamiah Marmol MRN: [id number] DOB: 08-19-1965    ADMISSION DATE:  03/30/2020 CONSULTATION DATE: 03/30/2020  REFERRING MD : Dr. Karma Greaser   CHIEF COMPLAINT: Nausea/Vomiting   BRIEF PATIENT DESCRIPTION:  54 yo female admitted with severe metabolic acidosis secondary to DKA requiring insulin gtt   SIGNIFICANT EVENTS/STUDIES:  10/10: Pt admitted to ICU with DKA   HISTORY OF PRESENT ILLNESS:   This is a 54 yo female with a PMH of Stage III Vulvar Intraepithelial Neoplasia, Urinary Incontinence, Tobacco Abuse, Spinal Stenosis, Shingles, Pancreatitis, MI, Ischemic Cardiomyopathy (Echo 2019: EF 30-35%), HTN, Degenerative Disc Disease, GERD, CAD, COPD, Chronic Combined Systolic and Diastolic CHF, and Cervical Disc Disease.  She presented to [facility] ER on 10/10 from home via EMS with nausea, vomiting, and generalized pain onset of symptoms 10/9.  Per ER notes pts neighbors called EMS when they heard her crying out and had not seen the pt since yesterday.  When EMS arrived at pts home they found her on the floor with c/o weakness and thirst due to inability to eat or drink fluids secondary to frequent vomiting.  Upon arrival to the ER lab results revealed Na+ 124, chloride 89, CO2 8, glucose 864, BUN 47, creatinine 2.20, anion gap 27, lipase 631, lactic acid 2.6, wbc 22.6, COVID-19/Influeza PCR negative, and vbg pH 6.97/pCO2 26/acid-base deficit 24.8/bicarb 6.0.  CXR consistent with chronic bronchitis.  Lab results ruled pt in for DKA, therefore insulin gtt initiated.  Due to elevated lipase ER physician ordered non contrasted CT Chest/Abd/Pelvis.  PCCM team contacted for ICU admission.    PAST MEDICAL HISTORY :   has a past medical history of Cancer ([place]), Cervical disc disease, Chronic combined systolic (congestive) and diastolic (congestive) heart failure (HCC), COPD (chronic obstructive pulmonary disease) ([place]), Coronary artery disease, GERD (gastroesophageal reflux disease), H/O degenerative disc  disease, Hypotension, Ischemic cardiomyopathy, Myocardial infarction ([place]), Pancreatitis, Shingles, Spinal stenosis, Tobacco abuse, Ulcer (traumatic) of oral mucosa, Urinary incontinence, and VIN III (vulvar intraepithelial neoplasia III).  has a past surgical history that includes Lymphadenectomy; vulvulasectomy; Splenectomy, partial; Abdominal hysterectomy; Ectopic pregnancy surgery (Left); Coronary/Graft Acute MI Revascularization (N/A, 12/19/2017); LEFT HEART CATH AND CORONARY ANGIOGRAPHY (N/A, 12/19/2017); LEFT HEART CATH AND CORONARY ANGIOGRAPHY (N/A, 12/26/2017); CORONARY STENT INTERVENTION (N/A, 12/26/2017); LEFT HEART CATH AND CORONARY ANGIOGRAPHY (N/A, 02/08/2018); Intravascular Ultrasound/IVUS (N/A, 02/08/2018); CORONARY STENT INTERVENTION (N/A, 02/08/2018); LEFT HEART CATH AND CORONARY ANGIOGRAPHY (N/A, 05/22/2018); CORONARY BALLOON ANGIOPLASTY (N/A, 05/22/2018); and LEFT HEART CATH AND CORONARY ANGIOGRAPHY (N/A, 06/15/2018). Prior to Admission medications   Medication Sig Start Date End Date Taking? Authorizing Provider  acetaminophen (TYLENOL) 325 MG tablet Take 650 mg by mouth every 6 (six) hours as needed for mild pain.     [provider]  albuterol (PROAIR HFA) 108 (90 Base) MCG/ACT inhaler Inhale 1 puff into the lungs every 6 (six) hours as needed for wheezing or shortness of breath.     [provider]  amitriptyline (ELAVIL) 50 MG tablet Take 75 mg by mouth at bedtime. [facility]     [provider]  aspirin 81 MG tablet Take 81 mg by mouth daily.    [provider]  atorvastatin (LIPITOR) 80 MG tablet Take 1 tablet (80 mg total) by mouth daily at 6 PM. 03/05/19 11/20/19  Theora Gianotti, NP  BD VEO INSULIN SYRINGE U/F 31G X 15/64" 0.5 ML MISC use as directed 4 times daily 03/13/19   Parks Ranger, Devonne Doughty, DO  benzonatate (TESSALON) 100 MG capsule Take 1  capsule by mouth three times daily as needed for cough Patient not taking: Reported on 11/20/2019  10/05/19   Olin Hauser, DO  Blood Glucose Monitoring Suppl [facility] BLOOD GLUCOSE MONITOR) DEVI 1 m. 11/15/17   Parks Ranger, Devonne Doughty, DO  citalopram (CELEXA) 40 MG tablet Take 1 tablet (40 mg total) by mouth daily. 06/18/19   Karamalegos, Devonne Doughty, DO  esomeprazole (NEXIUM) 40 MG capsule Take 1 capsule (40 mg total) by mouth daily. 01/15/20   Karamalegos, Devonne Doughty, DO  insulin aspart (NOVOLOG) 100 UNIT/ML injection Inject 35 Units into the skin 3 (three) times daily before meals. Take 10 units PLUS sliding scale 01/10/20 02/09/20  Karamalegos, Devonne Doughty, DO  insulin glargine (LANTUS) 100 UNIT/ML injection Inject 0.7 mLs (70 Units total) into the skin 2 (two) times daily. 01/10/20   Olin Hauser, DO  Lancet Devices (SIMPLE DIAGNOSTICS LANCING DEV) MISC AS DIRECTED 11/15/17   Parks Ranger, Devonne Doughty, DO  LORazepam (ATIVAN) 1 MG tablet Take 1 tablet (1 mg total) by mouth 3 (three) times daily as needed for anxiety. May take extra 0.5mg  (half tablet) if wake up overnight, for anxiety 12/17/19   Parks Ranger, Devonne Doughty, DO  methocarbamol (ROBAXIN) 500 MG tablet Take 500 mg by mouth 4 (four) times daily.    [provider]  mirtazapine (REMERON) 15 MG tablet Take 15 mg by mouth at bedtime. 07/07/18   [provider]  nitroGLYCERIN (NITROSTAT) 0.4 MG SL tablet Place 1 tablet (0.4 mg total) under the tongue every 5 (five) minutes as needed for chest pain. 06/16/18   Nicholes Mango, MD  Oxycodone HCl 10 MG TABS Take 2 tablets by mouth every 3 (three) hours. [facility] 10/04/18   Magnus Sinning, MD  polyethylene glycol powder [facility]) 17 GM/SCOOP powder Take by mouth. 10/20/14   [provider]  promethazine (PHENERGAN) 25 MG tablet Take 25 mg by mouth every 6 (six) hours as needed for nausea. for nausea 05/15/18   [provider]  ranolazine (RANEXA) 1000 MG SR tablet Take 1 tablet (1,000 mg total) by mouth 2 (two) times daily.  10/27/18   Wellington Hampshire, MD  ticagrelor (BRILINTA) 90 MG TABS tablet Take 1 tablet (90 mg total) by mouth 2 (two) times daily. *NEEDS OFFICE VISIT FOR FURTHER REFILLS-PLEASE CALL [phone] TO SCHEDULE.* 05/14/19   Theora Gianotti, NP   Allergies  Allergen Reactions  . Bee Venom Itching, Shortness Of Breath and Swelling  . Metformin And Related Shortness Of Breath and Swelling  . Darvon [Propoxyphene] Itching  . Gabapentin Swelling  . Nsaids Other (See Comments)    Ulcers   . Tramadol Hives  . Contrast Media [Iodinated Diagnostic Agents] Rash    If you benadryl, and steroids she is able to take the contrast per pt    FAMILY HISTORY:  family history includes Heart attack in her brother and father; Heart disease in her brother; Lupus in her sister; Non-Hodgkin's lymphoma in her mother; Osteoporosis in her mother. SOCIAL HISTORY:  reports that she quit smoking about 2 years ago. Her smoking use included cigarettes. She has a 15.00 pack-year smoking history. She has quit using smokeless tobacco. She reports that she does not drink alcohol and does not use drugs.  REVIEW OF SYSTEMS: Positives in BOLD  Constitutional: Negative for fever, chills, weight loss, malaise/fatigue and diaphoresis.  HENT: Negative for hearing loss, ear pain, nosebleeds, congestion, sore throat, neck pain, tinnitus and ear discharge.   Eyes: Negative for blurred  vision, double vision, photophobia, pain, discharge and redness.  Respiratory: cough, hemoptysis, sputum production, shortness of breath, wheezing and stridor.   Cardiovascular: Negative for chest pain, palpitations, orthopnea, claudication, leg swelling and PND.  Gastrointestinal: heartburn, nausea, vomiting, abdominal pain, diarrhea, constipation, blood in stool and melena.  Genitourinary: Negative for dysuria, urgency, frequency, hematuria and flank pain.  Musculoskeletal: generalized pain, myalgias, back pain, joint pain and falls.  Skin:  Negative for itching and rash.  Neurological: Negative for dizziness, tingling, tremors, sensory change, speech change, focal weakness, seizures, loss of consciousness, weakness and headaches.  Endo/Heme/Allergies: Negative for environmental allergies and polydipsia. Does not bruise/bleed easily.  SUBJECTIVE:  Pt c/o abdominal and buttocks pain  VITAL SIGNS: Temp:  [97 F (36.1 C)] 97 F (36.1 C) (10/10 0345) Pulse Rate:  [115-122] 115 (10/10 0400) Resp:  [29] 29 (10/10 0322) BP: (100-104)/(58-66) 104/58 (10/10 0400) SpO2:  [100 %] 100 % (10/10 0400) Weight:  [56.7 kg] 56.7 kg (10/10 0255)  PHYSICAL EXAMINATION: General: chronically ill frail appearing female, NAD resting in bed  Neuro: alert and oriented, follows commands, PERRLA  HEENT: supple, no JVD  Cardiovascular: sinus tach, no R/G  Lungs: clear throughout, even, non labored   Abdomen: hypoactive BS x4, soft, tender, non distended  Musculoskeletal: normal bulk and tone, no edema  Skin: intact no rashes or lesions present   Recent Labs  Lab 03/30/20 0304  NA 124*  K 5.0  CL 89*  CO2 8*  BUN 47*  CREATININE 2.20*  GLUCOSE 864*   Recent Labs  Lab 03/30/20 0304  HGB 17.6*  HCT 55.1*  WBC 22.6*  PLT 353   DG Chest Port 1 View  Result Date: 03/30/2020 CLINICAL DATA:  Possible sepsis.  Found down. EXAM: PORTABLE CHEST 1 VIEW COMPARISON:  08/29/2018 FINDINGS: Numerous leads and wires project over the chest. Midline trachea. Normal heart size. Atherosclerosis in the transverse aorta. No pleural effusion or pneumothorax. Mild hyperinflation and interstitial thickening. Somewhat more confluent increased density in the left lung base, along the left heart border. IMPRESSION: Hyperinflation and interstitial thickening, likely related to COPD/chronic bronchitis. Somewhat more focal left base opacity. This could represent scarring superimposed upon interstitial thickening. Early pneumonia could look similar. If there are  symptoms to localize left lower lobe pneumonia, recommend radiographic follow-up in 4-6 days. Aortic Atherosclerosis (ICD10-I70.0). Electronically Signed   By: Abigail Miyamoto M.D.   On: 03/30/2020 04:22    ASSESSMENT / PLAN:  Diabetic ketoacidosis  Continue insulin gtt until anion gap closed and serum CO2 >20 BMP q4hrs and beta-hydroxybutyric acid q8hrs while on insulin gtt IV fluids per DKA protocol  Ischemic cardiomyopathy  Hx: Chronic combined systolic and diastolic CHF, hypotension, CAD, hypotension, and hypotension  Continuous telemetry monitoring Will restart outpatient atorvastatin, brilinta, and aspirin once pt able to tolerate po's    Hyponatremia and acute renal failure secondary to dehydration Lactic acidosis   Metabolic acidosis secondary to DKA  Trend BMP and lactic acid  Replace electrolytes as indicated  Monitor UOP Avoid nephrotoxic medications  Will administer 2 amps of sodium bicarb   Leukocytosis Elevated lipase  Hx: Pancreatitis  Trend WBC and monitor fever curve PCT results pending  CT Abd Pelvis to r/o acute pancreatitis  CT Chest to r/o pneumonia   GERD  IV pepcid   Acute pain  Hx: Chronic pain secondary to vulvar intraepithelial neoplasia III Prn dilaudid for pain management  Will start outpatient narcotics once pt able to tolerate po's  Marda Stalker, [place] Pager [phone] (please enter 7 digits) PCCM Consult Pager [phone] (please enter 7 digits)

## 2020-03-30 NOTE — ED Provider Notes (Addendum)
[facility] Emergency Department Provider Note  ____________________________________________   First MD Initiated Contact with Patient 03/30/20 [phone]     (approximate)  I have reviewed the triage vital signs and the nursing notes.   HISTORY  Chief Complaint Weakness  Level 5 caveat:  history/ROS limited by acute/critical illness  HPI Renee Mack is a 54 y.o. female who reportedly has advanced vulvar cancer and is on palliative care for pain control but still reports that she is full code.  She also has a history of diabetes with prior episodes of DKA.  She presents by EMS tonight  for evaluation of generalized severe pain, vomiting for about 24 hours, generalized weakness.  She is in a lot of pain and is currently crying out and unable to provide much history.  She reports that she did not feel "normal" yesterday and felt nauseated and unable to eat or drink very much.  She says she was too sick to get up today and vomited extensively.  Paramedics report that there was vomit "all over the floor" and that EMS was alerted because neighbors heard her yelling through the walls.  She reports pain all over but primarily vulvar pain consistent with her history of cancer.  She does not know if she has had a fever.  She is not having any shortness of breath.        Past Medical History:  Diagnosis Date  . Cancer ([place])    vulvular  . Cervical disc disease    a. 01/2018 MRI Cervical spine: Cervical spondylosis with multilevel disc and facet degeneration greatest at C5-6 and C6-7.  Moderate to sev R C5-6, mod L C5-6, and mod bilat C6-7 foraminal stenosis with multilevel mild foraminal stenosis.  Mild C5-6 and mod C6-7 canal stenosis. C6-7 central cord impingement w/ cord flattening.  . Chronic combined systolic (congestive) and diastolic (congestive) heart failure ([place])    a. 12/2017 Echo: EF 30-35%, mid-apicalanteroseptal, ant, and apical AK. Gr1 DD. Mild conc LVH; b.  03/2018 Echo: EF 45-50%, antsept, ant HK. Mild MR. Nl LA size. Nl RV fxn.  Marland Kitchen COPD (chronic obstructive pulmonary disease) (HCC)    not on home oxygen  . Coronary artery disease    a. 12/2017 ACS/PCI: LAD 100p (2.25x26 Resolute Onyx DES), 54m (2.0x12 Resolute Onyx DES), 80d (2.0x15 Resolute Onyx DES), RCA 90p (non-dominant). EF 25-35%. Post-MI course complicated by CGS; b. 01/1274 NSTEMI/subacute thrombosis-->LAD 100 (PTCA + DES x 1); c. 01/2018 NSTEMI/PCI: LM min irregs, LAD 50-60p ISR/hazy (3.5x12 Resolute DES), 30m/d (underexpansion of prior stents-->PTCA). EF 45-50%.  Marland Kitchen GERD (gastroesophageal reflux disease)   . H/O degenerative disc disease   . Hypotension   . Ischemic cardiomyopathy    a. 12/2017 Echo: EF 30-35%; b. 03/2018 Echo: EF 45-50%.  . Myocardial infarction ([place])    a. 12/2017-->DES to LAD x 3.  . Pancreatitis   . Shingles   . Spinal stenosis   . Tobacco abuse   . Ulcer (traumatic) of oral mucosa   . Urinary incontinence   . VIN III (vulvar intraepithelial neoplasia III)     Patient Active Problem List   Diagnosis Date Noted  . Vulvar cancer ([place]) 07/27/2019  . Palliative care patient 09/04/2018  . Hyperglycemia 05/28/2018  . Vulvar intraepithelial neoplasia (VIN) grade 3 05/24/2018  . History of ST elevation myocardial infarction (STEMI) 02/27/2018  . CAD (coronary artery disease) 02/27/2018  . Hyperlipidemia associated with type 2 diabetes mellitus ([place]) 02/27/2018  . Anxiety  associated with depression 02/27/2018  . Chronic systolic heart failure ([place])   . Chest pain 12/26/2017  . Unstable angina ([place])   . Ischemic cardiomyopathy   . Former smoker   . Protein-calorie malnutrition, severe 07/18/2017  . Uncontrolled type 2 diabetes with neuropathy ([place]) 12/28/2014  . Centrilobular emphysema ([place]) 12/28/2014  . GERD (gastroesophageal reflux disease) 12/28/2014  . History of shingles 12/28/2014  . Hyperlipidemia LDL goal <70 12/28/2014    Past Surgical History:    Procedure Laterality Date  . ABDOMINAL HYSTERECTOMY     partial  . CORONARY BALLOON ANGIOPLASTY N/A 05/22/2018   Procedure: CORONARY BALLOON ANGIOPLASTY;  Surgeon: Wellington Hampshire, MD;  Location: [place] [facility];  Service: Cardiovascular;  Laterality: N/A;  . CORONARY STENT INTERVENTION N/A 12/26/2017   Procedure: CORONARY STENT INTERVENTION;  Surgeon: Wellington Hampshire, MD;  Location: [place] [facility];  Service: Cardiovascular;  Laterality: N/A;  . CORONARY STENT INTERVENTION N/A 02/08/2018   Procedure: CORONARY STENT INTERVENTION;  Surgeon: Nelva Bush, MD;  Location: [place] [facility];  Service: Cardiovascular;  Laterality: N/A;  . CORONARY/GRAFT ACUTE MI REVASCULARIZATION N/A 12/19/2017   Procedure: Coronary/Graft Acute MI Revascularization;  Surgeon: Wellington Hampshire, MD;  Location: [place] [facility];  Service: Cardiovascular;  Laterality: N/A;  . ECTOPIC PREGNANCY SURGERY Left   . INTRAVASCULAR ULTRASOUND/IVUS N/A 02/08/2018   Procedure: Intravascular Ultrasound/IVUS;  Surgeon: Nelva Bush, MD;  Location: [place] [facility];  Service: Cardiovascular;  Laterality: N/A;  . LEFT HEART CATH AND CORONARY ANGIOGRAPHY N/A 12/19/2017   Procedure: LEFT HEART CATH AND CORONARY ANGIOGRAPHY;  Surgeon: Wellington Hampshire, MD;  Location: [place] [facility];  Service: Cardiovascular;  Laterality: N/A;  . LEFT HEART CATH AND CORONARY ANGIOGRAPHY N/A 12/26/2017   Procedure: LEFT HEART CATH AND CORONARY ANGIOGRAPHY;  Surgeon: Wellington Hampshire, MD;  Location: [place] [facility];  Service: Cardiovascular;  Laterality: N/A;  . LEFT HEART CATH AND CORONARY ANGIOGRAPHY N/A 02/08/2018   Procedure: LEFT HEART CATH AND CORONARY ANGIOGRAPHY;  Surgeon: Nelva Bush, MD;  Location: [place] [facility];  Service: Cardiovascular;  Laterality: N/A;  . LEFT HEART CATH AND CORONARY ANGIOGRAPHY N/A 05/22/2018   Procedure: LEFT HEART CATH AND CORONARY ANGIOGRAPHY;  Surgeon: Wellington Hampshire, MD;   Location: [place] [facility];  Service: Cardiovascular;  Laterality: N/A;  . LEFT HEART CATH AND CORONARY ANGIOGRAPHY N/A 06/15/2018   Procedure: LEFT HEART CATH AND CORONARY ANGIOGRAPHY;  Surgeon: Wellington Hampshire, MD;  Location: [place] [facility];  Service: Cardiovascular;  Laterality: N/A;  . LYMPHADENECTOMY    . SPLENECTOMY, PARTIAL    . vulvulasectomy      Prior to Admission medications   Medication Sig Start Date End Date Taking? Authorizing Provider  acetaminophen (TYLENOL) 325 MG tablet Take 650 mg by mouth every 6 (six) hours as needed for mild pain.     [provider]  albuterol (PROAIR HFA) 108 (90 Base) MCG/ACT inhaler Inhale 1 puff into the lungs every 6 (six) hours as needed for wheezing or shortness of breath.     [provider]  amitriptyline (ELAVIL) 50 MG tablet Take 75 mg by mouth at bedtime. [facility]     [provider]  aspirin 81 MG tablet Take 81 mg by mouth daily.    [provider]  atorvastatin (LIPITOR) 80 MG tablet Take 1 tablet (80 mg total) by mouth daily at 6 PM. 03/05/19 11/20/19  Theora Gianotti, NP  BD VEO INSULIN SYRINGE U/F 31G X 15/64" 0.5 ML MISC use as directed 4 times daily 03/13/19   Parks Ranger, Devonne Doughty, DO  benzonatate (TESSALON) 100 MG capsule Take 1 capsule by mouth three times daily as needed for cough Patient not taking: Reported on 11/20/2019 10/05/19   Olin Hauser, DO  Blood Glucose Monitoring Suppl [facility] BLOOD GLUCOSE MONITOR) DEVI 1 m. 11/15/17   Parks Ranger, Devonne Doughty, DO  citalopram (CELEXA) 40 MG tablet Take 1 tablet (40 mg total) by mouth daily. 06/18/19   Karamalegos, Devonne Doughty, DO  esomeprazole (NEXIUM) 40 MG capsule Take 1 capsule (40 mg total) by mouth daily. 01/15/20   Karamalegos, Devonne Doughty, DO  insulin aspart (NOVOLOG) 100 UNIT/ML injection Inject 35 Units into the skin 3 (three) times daily before meals. Take 10 units PLUS sliding scale 01/10/20 02/09/20   Karamalegos, Devonne Doughty, DO  insulin glargine (LANTUS) 100 UNIT/ML injection Inject 0.7 mLs (70 Units total) into the skin 2 (two) times daily. 01/10/20   Olin Hauser, DO  Lancet Devices (SIMPLE DIAGNOSTICS LANCING DEV) MISC AS DIRECTED 11/15/17   Parks Ranger, Devonne Doughty, DO  LORazepam (ATIVAN) 1 MG tablet Take 1 tablet (1 mg total) by mouth 3 (three) times daily as needed for anxiety. May take extra 0.5mg  (half tablet) if wake up overnight, for anxiety 12/17/19   Parks Ranger, Devonne Doughty, DO  methocarbamol (ROBAXIN) 500 MG tablet Take 500 mg by mouth 4 (four) times daily.    [provider]  mirtazapine (REMERON) 15 MG tablet Take 15 mg by mouth at bedtime. 07/07/18   [provider]  nitroGLYCERIN (NITROSTAT) 0.4 MG SL tablet Place 1 tablet (0.4 mg total) under the tongue every 5 (five) minutes as needed for chest pain. 06/16/18   Nicholes Mango, MD  Oxycodone HCl 10 MG TABS Take 2 tablets by mouth every 3 (three) hours. [facility] 10/04/18   Magnus Sinning, MD  polyethylene glycol powder [facility]) 17 GM/SCOOP powder Take by mouth. 10/20/14   [provider]  promethazine (PHENERGAN) 25 MG tablet Take 25 mg by mouth every 6 (six) hours as needed for nausea. for nausea 05/15/18   [provider]  ranolazine (RANEXA) 1000 MG SR tablet Take 1 tablet (1,000 mg total) by mouth 2 (two) times daily. 10/27/18   Wellington Hampshire, MD  ticagrelor (BRILINTA) 90 MG TABS tablet Take 1 tablet (90 mg total) by mouth 2 (two) times daily. *NEEDS OFFICE VISIT FOR FURTHER REFILLS-PLEASE CALL [phone] TO SCHEDULE.* 05/14/19   Theora Gianotti, NP    Allergies Bee venom, Metformin and related, Darvon [propoxyphene], Gabapentin, Nsaids, Tramadol, and Contrast media [iodinated diagnostic agents]  Family History  Problem Relation Age of Onset  . Non-Hodgkin's lymphoma Mother   . Osteoporosis Mother   . Lupus Sister   . Heart disease Brother     . Heart attack Brother   . Heart attack Father     Social History Social History   Tobacco Use  . Smoking status: Former Smoker    Packs/day: 0.50    Years: 30.00    Pack years: 15.00    Types: Cigarettes    Quit date: 12/19/2017    Years since quitting: 2.2  . Smokeless tobacco: Former Systems developer  . Tobacco comment: quit 12/19/2017.  Vaping Use  . Vaping Use: Never used  Substance Use Topics  . Alcohol use: No  . Drug use: No    Review of Systems Level 5 caveat:  history/ROS limited  by acute/critical illness   ____________________________________________   PHYSICAL EXAM:  ED Triage Vitals  Enc Vitals Group     BP 03/30/20 0322 100/66     Pulse Rate 03/30/20 0322 (!) 122     Resp 03/30/20 0322 (!) 29     Temp 03/30/20 0345 (!) 97 F (36.1 C)     Temp Source 03/30/20 0345 Rectal     SpO2 03/30/20 0322 100 %     Weight 03/30/20 0255 56.7 kg (125 lb)     Height 03/30/20 0255 1.549 m (5\' 1" )     Head Circumference --      Peak Flow --      Pain Score 03/30/20 0254 10     Pain Loc --      Pain Edu? --      Excl. in [place]? --      Constitutional: Alert and oriented but in severe distress. Eyes: Conjunctivae are normal.  Head: Atraumatic. Nose: No congestion/rhinnorhea. Mouth/Throat: Dry mucous membranes Neck: No stridor.  No meningeal signs.   Cardiovascular: Tachycardia, regular rhythm. Good peripheral circulation. Grossly normal heart sounds. Respiratory: Increased respiratory rate but essentially normal respiratory effort.  No retractions. Gastrointestinal: Soft and nondistended with generalized tenderness to palpation throughout but without any focalized tenderness or evidence of localized peritonitis. Musculoskeletal: No lower extremity tenderness nor edema. No gross deformities of extremities. Neurologic: Unable to participate fully in neurological exam but she is moving all 4 extremities and has no obvious focal neurological deficits. Skin:  Skin is warm, dry and  intact.   ____________________________________________   LABS (all labs ordered are listed, but only abnormal results are displayed)  Labs Reviewed  LACTIC ACID, PLASMA - Abnormal; Notable for the following components:      Result Value   Lactic Acid, Venous 2.6 (*)    All other components within normal limits  COMPREHENSIVE METABOLIC PANEL - Abnormal; Notable for the following components:   Sodium 124 (*)    Chloride 89 (*)    CO2 8 (*)    Glucose, Bld 864 (*)    BUN 47 (*)    Creatinine, Ser 2.20 (*)    Total Protein 8.8 (*)    Total Bilirubin 3.5 (*)    GFR, Estimated 24 (*)    Anion gap 27 (*)    All other components within normal limits  CBC WITH DIFFERENTIAL/PLATELET - Abnormal; Notable for the following components:   WBC 22.6 (*)    RBC 5.54 (*)    Hemoglobin 17.6 (*)    HCT 55.1 (*)    Neutro Abs 14.6 (*)    Monocytes Absolute 1.5 (*)    Eosinophils Absolute 4.5 (*)    Abs Immature Granulocytes 0.47 (*)    All other components within normal limits  URINALYSIS, COMPLETE (UACMP) WITH MICROSCOPIC - Abnormal; Notable for the following components:   Color, Urine YELLOW (*)    APPearance HAZY (*)    Glucose, UA >=500 (*)    Hgb urine dipstick SMALL (*)    Ketones, ur 80 (*)    Protein, ur 100 (*)    All other components within normal limits  LIPASE, BLOOD - Abnormal; Notable for the following components:   Lipase 631 (*)    All other components within normal limits  BLOOD GAS, VENOUS - Abnormal; Notable for the following components:   pH, Ven 6.97 (*)    pCO2, Ven 26 (*)    Bicarbonate 6.0 (*)    Acid-base  deficit 24.8 (*)    All other components within normal limits  BASIC METABOLIC PANEL - Abnormal; Notable for the following components:   Sodium 127 (*)    Chloride 92 (*)    CO2 8 (*)    Glucose, Bld 787 (*)    BUN 46 (*)    Creatinine, Ser 1.93 (*)    GFR, Estimated 28 (*)    Anion gap 27 (*)    All other components within normal limits    BETA-HYDROXYBUTYRIC ACID - Abnormal; Notable for the following components:   Beta-Hydroxybutyric Acid >8.00 (*)    All other components within normal limits  PHOSPHORUS - Abnormal; Notable for the following components:   Phosphorus 5.8 (*)    All other components within normal limits  GLUCOSE, CAPILLARY - Abnormal; Notable for the following components:   Glucose-Capillary >600 (*)    All other components within normal limits  CULTURE, BLOOD (ROUTINE X 2)  RESPIRATORY PANEL BY RT PCR (FLU A&B, COVID)  URINE CULTURE  CULTURE, BLOOD (ROUTINE X 2)  PROTIME-INR  MAGNESIUM  MAGNESIUM  LACTIC ACID, PLASMA  BASIC METABOLIC PANEL  BASIC METABOLIC PANEL  BASIC METABOLIC PANEL  BASIC METABOLIC PANEL  BETA-HYDROXYBUTYRIC ACID  BETA-HYDROXYBUTYRIC ACID  HIV ANTIBODY (ROUTINE TESTING W REFLEX)  BASIC METABOLIC PANEL  PROCALCITONIN   ____________________________________________  EKG  ED ECG REPORT I, Hinda Kehr, the attending physician, personally viewed and interpreted this ECG.  Date: 03/30/2020 EKG Time: 2:55 AM Rate: 129 Rhythm: Sinus tachycardia QRS Axis: normal Intervals: normal ST/T Wave abnormalities: Non-specific ST segment / T-wave changes, but no clear evidence of acute ischemia. Narrative Interpretation: no definitive evidence of acute ischemia; does not meet STEMI criteria.   ____________________________________________  RADIOLOGY I, Hinda Kehr, personally viewed and evaluated these images (plain radiographs) as part of my medical decision making, as well as reviewing the written report by the radiologist.  ED MD interpretation:  CXR questionable for PNA.  CTs of chest/abd/pelvis pending at time of admission.  Official radiology report(s): DG Chest Port 1 View  Result Date: 03/30/2020 CLINICAL DATA:  Possible sepsis.  Found down. EXAM: PORTABLE CHEST 1 VIEW COMPARISON:  08/29/2018 FINDINGS: Numerous leads and wires project over the chest. Midline trachea.  Normal heart size. Atherosclerosis in the transverse aorta. No pleural effusion or pneumothorax. Mild hyperinflation and interstitial thickening. Somewhat more confluent increased density in the left lung base, along the left heart border. IMPRESSION: Hyperinflation and interstitial thickening, likely related to COPD/chronic bronchitis. Somewhat more focal left base opacity. This could represent scarring superimposed upon interstitial thickening. Early pneumonia could look similar. If there are symptoms to localize left lower lobe pneumonia, recommend radiographic follow-up in 4-6 days. Aortic Atherosclerosis (ICD10-I70.0). Electronically Signed   By: Abigail Miyamoto M.D.   On: 03/30/2020 04:22    ____________________________________________   PROCEDURES   Procedure(s) performed (including Critical Care):  .Critical Care Performed by: Hinda Kehr, MD Authorized by: Hinda Kehr, MD   Critical care provider statement:    Critical care time (minutes):  45   Critical care time was exclusive of:  Separately billable procedures and treating other patients   Critical care was necessary to treat or prevent imminent or life-threatening deterioration of the following conditions:  Metabolic crisis   Critical care was time spent personally by me on the following activities:  Development of treatment plan with patient or surrogate, discussions with consultants, evaluation of patient's response to treatment, examination of patient, obtaining history from patient or surrogate,  ordering and performing treatments and interventions, ordering and review of laboratory studies, ordering and review of radiographic studies, pulse oximetry, re-evaluation of patient's condition and review of old charts .1-3 Lead EKG Interpretation Performed by: Hinda Kehr, MD Authorized by: Hinda Kehr, MD     Interpretation: abnormal     ECG rate:  132   ECG rate assessment: tachycardic     Rhythm: sinus tachycardia      Ectopy: none     Conduction: normal       ____________________________________________   INITIAL IMPRESSION / MDM / ASSESSMENT AND PLAN / ED COURSE  As part of my medical decision making, I reviewed the following data within the [place] notes reviewed and incorporated, Labs reviewed , EKG interpreted , Old chart reviewed, Radiograph reviewed , Discussed with admitting physician , Notes from prior ED visits and [place] Controlled Substance Database   Differential diagnosis includes, but is not limited to, sepsis, UTI, pneumonia, COVID-19, DKA, other nonspecific metabolic or electrolyte abnormality.  Patient has no focal tenderness to palpation of the abdomen and I think most likely this is primarily a DKA issue.  I initiated a "presepsis" work-up but I am not calling a code sepsis yet.  However the patient is hypotensive, tachycardic, and tachypneic, which could be DKA with volume loss or it could be evidence of severe sepsis.  I ordered lactated Ringer's 1 L IV while awaiting the rest of the work-up.  I verified in the medical record that the patient takes oxycodone 60 mg 3 times a day in addition to Klonopin and I suspect that she is having at least a degree of opioid withdrawal given the extensive vomiting she has been having today as well.  That undoubtedly is adding to her generalized pain and probably to her tachycardia and possibly vomiting as well.  I will give her a dose of morphine 4 mg IV to see if this helps but will hold off on high doses of opioids given her hypotension.  The patient is on the cardiac monitor to evaluate for evidence of arrhythmia and/or significant heart rate changes.     Clinical Course as of Mar 30 714  Sun Mar 30, 2020  0322 WBC(!): 22.6 [CF]  0335 VBG very concerning for DKA  Blood gas, venous(!!) [CF]  0341 Lactic Acid, Venous(!!): 2.6 [CF]  0425 Comprehensive metabolic panel shows a potassium of 5, glucose of 864, and anion gap of  27.  Also has acute renal failure with a creatinine of 2.2.  I ordered DKA treatment according to the order set.  Patient also received initially 4 mg of morphine IV but then required another milligram of IV Dilaudid in order to get in and out catheterization for urine.  Given the severity of her lab abnormalities and clinical presentation, I contacted Marda Stalker with the ICU.  We discussed the case and she will admit the patient.   [CF]  0426 Lipase(!): 631 [CF]  0426 Questionable LLL opacity  DG Chest [facility] [CF]  [phone] Ordering CT chest/abd/pelvis w/o contrast to further evaluate possible LLL opacity as well as to look for evidence of pancreatitis or other acute intraabdominal infection/abnormality.   [CF]  0437 Magnesium: 2.4 [CF]  0437 Anion gap(!): 27 [CF]  0439 SARS Coronavirus 2 by RT PCR: NEGATIVE [CF]  0522 Ketones, no obvious infection  Urinalysis, Complete w Microscopic(!) [CF]  0544 Beta-Hydroxybutyric Acid(!): >8.00 [CF]  3419 Made ICU aware of the CT chest  concerning for pneumonia.  Still awaiting official radiology report.   [CF]    Clinical Course User Index [CF] Hinda Kehr, MD     ____________________________________________  FINAL CLINICAL IMPRESSION(S) / ED DIAGNOSES  Final diagnoses:  Diabetic ketoacidosis without coma associated with type 2 diabetes mellitus ([place])  Acute renal failure, unspecified acute renal failure type ([place])  Intractable vomiting with nausea, unspecified vomiting type  Vulvar cancer ([place])  Chronic pain due to neoplasm  Acute pancreatitis, unspecified complication status, unspecified pancreatitis type  Elevated lactic acid level     MEDICATIONS GIVEN DURING THIS VISIT:  Medications  insulin regular, human (MYXREDLIN) 100 units/ 100 mL infusion (7 Units/hr Intravenous Rate/Dose Verify 03/30/20 0712)  lactated ringers infusion ( Intravenous New Bag/Given 03/30/20 0607)  dextrose 5 % in lactated ringers infusion (has no  administration in time range)  dextrose 50 % solution 0-50 mL (has no administration in time range)  potassium chloride 10 mEq in 100 mL IVPB (10 mEq Intravenous New Bag/Given 03/30/20 0609)  docusate sodium (COLACE) capsule 100 mg (has no administration in time range)  polyethylene glycol (MIRALAX / GLYCOLAX) packet 17 g (has no administration in time range)  heparin injection 5,000 Units (has no administration in time range)  ondansetron (ZOFRAN) injection 4 mg (4 mg Intravenous Given 03/30/20 0631)  albuterol (VENTOLIN HFA) 108 (90 Base) MCG/ACT inhaler 1 puff (has no administration in time range)  citalopram (CELEXA) tablet 40 mg (has no administration in time range)  amitriptyline (ELAVIL) tablet 75 mg (has no administration in time range)  methocarbamol (ROBAXIN) tablet 500 mg (has no administration in time range)  mirtazapine (REMERON) tablet 15 mg (has no administration in time range)  HYDROmorphone (DILAUDID) injection 1 mg (has no administration in time range)  famotidine (PEPCID) IVPB 20 mg premix (has no administration in time range)  lactated ringers bolus 1,000 mL (0 mLs Intravenous Stopped 03/30/20 0451)  morphine 4 MG/ML injection 4 mg (4 mg Intravenous Given 03/30/20 0320)  HYDROmorphone (DILAUDID) injection 1 mg (1 mg Intravenous Given 03/30/20 0401)  sodium bicarbonate injection 100 mEq (100 mEq Intravenous Given 03/30/20 0553)     ED Discharge Orders    None      *Please note:  Christiona Siddique was evaluated in Emergency Department on 03/30/2020 for the symptoms described in the history of present illness. She was evaluated in the context of the global COVID-19 pandemic, which necessitated consideration that the patient might be at risk for infection with the SARS-CoV-2 virus that causes COVID-19. Institutional protocols and algorithms that pertain to the evaluation of patients at risk for COVID-19 are in a state of rapid change based on information released by regulatory  bodies including the CDC and federal and state organizations. These policies and algorithms were followed during the patient's care in the ED.  Some ED evaluations and interventions may be delayed as a result of limited staffing during and after the pandemic.*  Note:  This document was prepared using Dragon voice recognition software and may include unintentional dictation errors.   Hinda Kehr, MD 03/30/20 4696    Hinda Kehr, MD 03/30/20 [phone]

## 2020-03-30 NOTE — ED Triage Notes (Addendum)
Pt arrives from home where she lives alone via [facility].  Neighbors had not seen her since yesterday, heard her crying out and called EMS.  Pt found on floor, main complaint weakness.  A&Ox4.  Pt complaining of thirst, nausea and pain.

## 2020-03-30 NOTE — ED Notes (Signed)
Patient is resting well. States she feels fine but just feels sleepy. Patient denies need for anything at this time. Will continue to monitor.

## 2020-03-31 LAB — URINE CULTURE: Culture: NO GROWTH

## 2020-03-31 LAB — PHOSPHORUS: Phosphorus: 1.7 mg/dL — ABNORMAL LOW (ref 2.5–4.6)

## 2020-03-31 LAB — CBC WITH DIFFERENTIAL/PLATELET
Abs Immature Granulocytes: 0.11 10*3/uL — ABNORMAL HIGH (ref 0.00–0.07)
Basophils Absolute: 0 10*3/uL (ref 0.0–0.1)
Basophils Relative: 0 %
Eosinophils Absolute: 0 10*3/uL (ref 0.0–0.5)
Eosinophils Relative: 0 %
HCT: 42.8 % (ref 36.0–46.0)
Hemoglobin: 15.2 g/dL — ABNORMAL HIGH (ref 12.0–15.0)
Immature Granulocytes: 1 %
Lymphocytes Relative: 5 %
Lymphs Abs: 1 10*3/uL (ref 0.7–4.0)
MCH: 31.9 pg (ref 26.0–34.0)
MCHC: 35.5 g/dL (ref 30.0–36.0)
MCV: 89.9 fL (ref 80.0–100.0)
Monocytes Absolute: 1.2 10*3/uL — ABNORMAL HIGH (ref 0.1–1.0)
Monocytes Relative: 6 %
Neutro Abs: 18.5 10*3/uL — ABNORMAL HIGH (ref 1.7–7.7)
Neutrophils Relative %: 88 %
Platelets: 214 10*3/uL (ref 150–400)
RBC: 4.76 MIL/uL (ref 3.87–5.11)
RDW: 13.2 % (ref 11.5–15.5)
WBC: 20.8 10*3/uL — ABNORMAL HIGH (ref 4.0–10.5)
nRBC: 0 % (ref 0.0–0.2)

## 2020-03-31 LAB — HEMOGLOBIN A1C
Hgb A1c MFr Bld: 14.5 % — ABNORMAL HIGH (ref 4.8–5.6)
Mean Plasma Glucose: 369.45 mg/dL

## 2020-03-31 LAB — BASIC METABOLIC PANEL
Anion gap: 13 (ref 5–15)
Anion gap: 15 (ref 5–15)
BUN: 48 mg/dL — ABNORMAL HIGH (ref 6–20)
BUN: 40 mg/dL — ABNORMAL HIGH (ref 6–20)
CO2: 13 mmol/L — ABNORMAL LOW (ref 22–32)
CO2: 11 mmol/L — ABNORMAL LOW (ref 22–32)
Calcium: 8.8 mg/dL — ABNORMAL LOW (ref 8.9–10.3)
Calcium: 8.7 mg/dL — ABNORMAL LOW (ref 8.9–10.3)
Chloride: 108 mmol/L (ref 98–111)
Chloride: 107 mmol/L (ref 98–111)
Creatinine, Ser: 1.12 mg/dL — ABNORMAL HIGH (ref 0.44–1.00)
Creatinine, Ser: 1.07 mg/dL — ABNORMAL HIGH (ref 0.44–1.00)
GFR, Estimated: 55 mL/min — ABNORMAL LOW (ref >60)
GFR, Estimated: 58 mL/min — ABNORMAL LOW (ref >60)
Glucose, Bld: 284 mg/dL — ABNORMAL HIGH (ref 70–99)
Glucose, Bld: 367 mg/dL — ABNORMAL HIGH (ref 70–99)
Potassium: 4.5 mmol/L (ref 3.5–5.1)
Potassium: 4.4 mmol/L (ref 3.5–5.1)
Sodium: 134 mmol/L — ABNORMAL LOW (ref 135–145)
Sodium: 133 mmol/L — ABNORMAL LOW (ref 135–145)

## 2020-03-31 LAB — GLUCOSE, CAPILLARY
Glucose-Capillary: 287 mg/dL — ABNORMAL HIGH (ref 70–99)
Glucose-Capillary: 341 mg/dL — ABNORMAL HIGH (ref 70–99)
Glucose-Capillary: 347 mg/dL — ABNORMAL HIGH (ref 70–99)
Glucose-Capillary: 290 mg/dL — ABNORMAL HIGH (ref 70–99)
Glucose-Capillary: 147 mg/dL — ABNORMAL HIGH (ref 70–99)
Glucose-Capillary: 90 mg/dL (ref 70–99)

## 2020-03-31 LAB — MAGNESIUM: Magnesium: 2.1 mg/dL (ref 1.7–2.4)

## 2020-03-31 MED ORDER — FAMOTIDINE 20 MG PO TABS
20.0000 mg | ORAL_TABLET | Freq: Every day | ORAL | Status: DC
  Administered 2020-04-01 – 2020-04-05 (×5): 20 mg via ORAL
  Filled 2020-03-31 (×5): qty 1

## 2020-03-31 MED ORDER — MAGNESIUM SULFATE 2 GM/50ML IV SOLN
INTRAVENOUS | Status: AC
  Administered 2020-03-31: 2 g via INTRAVENOUS
  Filled 2020-03-31: qty 50

## 2020-03-31 MED ORDER — INSULIN ASPART 100 UNIT/ML ~~LOC~~ SOLN
10.0000 [IU] | Freq: Three times a day (TID) | SUBCUTANEOUS | Status: DC
  Administered 2020-04-01 (×2): 10 [IU] via SUBCUTANEOUS
  Filled 2020-03-31 (×3): qty 1

## 2020-03-31 MED ORDER — OXYCODONE HCL ER 20 MG PO T12A
60.0000 mg | EXTENDED_RELEASE_TABLET | Freq: Three times a day (TID) | ORAL | Status: DC
  Administered 2020-03-31 – 2020-04-05 (×15): 60 mg via ORAL
  Filled 2020-03-31: qty 6
  Filled 2020-03-31 (×2): qty 3
  Filled 2020-03-31: qty 6
  Filled 2020-03-31: qty 3
  Filled 2020-03-31: qty 6
  Filled 2020-03-31: qty 3
  Filled 2020-03-31 (×2): qty 6
  Filled 2020-03-31 (×2): qty 3
  Filled 2020-03-31 (×4): qty 6

## 2020-03-31 MED ORDER — ATORVASTATIN CALCIUM 20 MG PO TABS
80.0000 mg | ORAL_TABLET | Freq: Every day | ORAL | Status: DC
  Administered 2020-03-31 – 2020-04-04 (×5): 80 mg via ORAL
  Filled 2020-03-31 (×5): qty 4

## 2020-03-31 MED ORDER — PROMETHAZINE HCL 25 MG PO TABS: 25.0000 mg | ORAL_TABLET | Freq: Four times a day (QID) | ORAL | Status: DC | PRN

## 2020-03-31 MED ORDER — INSULIN GLARGINE 100 UNIT/ML ~~LOC~~ SOLN
20.0000 [IU] | Freq: Two times a day (BID) | SUBCUTANEOUS | Status: DC
  Administered 2020-03-31: 20 [IU] via SUBCUTANEOUS
  Filled 2020-03-31 (×2): qty 0.2

## 2020-03-31 MED ORDER — INSULIN ASPART 100 UNIT/ML ~~LOC~~ SOLN: 0.0000 [IU] | Freq: Every day | SUBCUTANEOUS | Status: DC

## 2020-03-31 MED ORDER — CLONAZEPAM 0.5 MG PO TABS
0.5000 mg | ORAL_TABLET | Freq: Three times a day (TID) | ORAL | Status: DC | PRN
  Administered 2020-04-02 – 2020-04-03 (×3): 0.5 mg via ORAL
  Filled 2020-03-31 (×3): qty 1

## 2020-03-31 MED ORDER — OXYCODONE HCL ER 60 MG PO T12A
1.0000 | EXTENDED_RELEASE_TABLET | Freq: Three times a day (TID) | ORAL | Status: DC
Start: 2020-03-31 — End: 2020-03-31

## 2020-03-31 MED ORDER — SODIUM CHLORIDE 0.9 % IV SOLN
2.0000 g | Freq: Two times a day (BID) | INTRAVENOUS | Status: DC
  Administered 2020-03-31 – 2020-04-02 (×5): 2 g via INTRAVENOUS
  Filled 2020-03-31 (×8): qty 2

## 2020-03-31 MED ORDER — INSULIN ASPART 100 UNIT/ML ~~LOC~~ SOLN
0.0000 [IU] | Freq: Three times a day (TID) | SUBCUTANEOUS | Status: DC
  Administered 2020-03-31: 3 [IU] via SUBCUTANEOUS
  Administered 2020-03-31: 11 [IU] via SUBCUTANEOUS
  Administered 2020-04-01 (×2): 3 [IU] via SUBCUTANEOUS
  Filled 2020-03-31 (×3): qty 1

## 2020-03-31 MED ORDER — POTASSIUM & SODIUM PHOSPHATES 280-160-250 MG PO PACK
1.0000 | PACK | Freq: Three times a day (TID) | ORAL | Status: AC
  Administered 2020-03-31 (×3): 1 via ORAL
  Filled 2020-03-31 (×3): qty 1

## 2020-03-31 MED ORDER — SUCRALFATE 1 G PO TABS
1.0000 g | ORAL_TABLET | Freq: Four times a day (QID) | ORAL | Status: DC
  Administered 2020-03-31 – 2020-04-05 (×21): 1 g via ORAL
  Filled 2020-03-31 (×21): qty 1

## 2020-03-31 MED ORDER — RANOLAZINE ER 500 MG PO TB12
1000.0000 mg | ORAL_TABLET | Freq: Two times a day (BID) | ORAL | Status: DC
  Administered 2020-03-31 – 2020-04-02 (×6): 1000 mg via ORAL
  Filled 2020-03-31 (×7): qty 2

## 2020-03-31 MED ORDER — INSULIN ASPART 100 UNIT/ML ~~LOC~~ SOLN: 0.0000 [IU] | Freq: Three times a day (TID) | SUBCUTANEOUS | Status: DC

## 2020-03-31 MED ORDER — OXYCODONE HCL 5 MG PO TABS
20.0000 mg | ORAL_TABLET | ORAL | Status: DC | PRN
  Administered 2020-04-01 – 2020-04-03 (×8): 20 mg via ORAL
  Filled 2020-03-31 (×8): qty 4

## 2020-03-31 MED ORDER — INSULIN GLARGINE 100 UNIT/ML ~~LOC~~ SOLN
35.0000 [IU] | Freq: Two times a day (BID) | SUBCUTANEOUS | Status: DC
  Administered 2020-03-31 – 2020-04-02 (×4): 35 [IU] via SUBCUTANEOUS
  Filled 2020-03-31 (×6): qty 0.35

## 2020-03-31 MED ORDER — ASPIRIN EC 81 MG PO TBEC
81.0000 mg | DELAYED_RELEASE_TABLET | Freq: Every day | ORAL | Status: DC
  Administered 2020-03-31 – 2020-04-05 (×6): 81 mg via ORAL
  Filled 2020-03-31 (×7): qty 1

## 2020-03-31 MED ORDER — ACETAMINOPHEN 325 MG PO TABS: 650.0000 mg | ORAL_TABLET | Freq: Four times a day (QID) | ORAL | Status: DC | PRN

## 2020-03-31 MED ORDER — INSULIN ASPART 100 UNIT/ML ~~LOC~~ SOLN
0.0000 [IU] | Freq: Three times a day (TID) | SUBCUTANEOUS | Status: DC
  Administered 2020-03-31: 15 [IU] via SUBCUTANEOUS
  Filled 2020-03-31: qty 1

## 2020-03-31 MED ORDER — MAGNESIUM SULFATE 2 GM/50ML IV SOLN: 2.0000 g | Freq: Once | INTRAVENOUS | Status: AC

## 2020-03-31 MED ORDER — OXYCODONE HCL ER 20 MG PO T12A
60.0000 mg | EXTENDED_RELEASE_TABLET | Freq: Two times a day (BID) | ORAL | Status: DC
  Administered 2020-03-31: 60 mg via ORAL
  Filled 2020-03-31: qty 6
  Filled 2020-03-31: qty 3

## 2020-03-31 MED ORDER — OXYCODONE HCL 5 MG PO TABS
10.0000 mg | ORAL_TABLET | ORAL | Status: DC | PRN
  Administered 2020-03-31: 10 mg via ORAL
  Filled 2020-03-31: qty 2

## 2020-03-31 NOTE — ED Notes (Signed)
BG 341, Dr. Mortimer Fries made aware via secure chat, awaiting orders. Pt repositioned in bed, AO x4. Side rails up x2, call bell within reach

## 2020-03-31 NOTE — Progress Notes (Signed)
Inpatient Diabetes Program Recommendations  AACE/ADA: New Consensus Statement on Inpatient Glycemic Control (2015)  Target Ranges:  Prepandial:   less than 140 mg/dL      Peak postprandial:   less than 180 mg/dL (1-2 hours)      Critically ill patients:  140 - 180 mg/dL   Lab Results  Component Value Date   GLUCAP 347 (H) 03/31/2020   HGBA1C 14.0 (A) 04/26/2019    Review of Glycemic Control  Diabetes history: DM2 Outpatient Diabetes medications: Lantus 70 units bid + Novolog 35 units tid meal coverage Current orders for Inpatient glycemic control: Lantus 20 units bid + Novolog resistant correction tid + hs 0-5 units  Inpatient Diabetes Program Recommendations:   -Increase Lantus to 35 units bid -Add Novolog 10 units tid meal coverage if eats 50%  Noted pending A1c.  Thank you, Nani Gasser. Deano Tomaszewski, RN, MSN, CDE  Diabetes Coordinator Inpatient Glycemic Control Team Team Pager [phone] (8am-5pm) 03/31/2020 11:30 AM

## 2020-03-31 NOTE — Progress Notes (Signed)
PHARMACY CONSULT NOTE - FOLLOW UP  Pharmacy Consult for Electrolyte Monitoring and Replacement   Recent Labs: Potassium (mmol/L)  Date Value  03/31/2020 4.4  04/19/2014 4.2   Magnesium (mg/dL)  Date Value  03/31/2020 2.1   Calcium (mg/dL)  Date Value  03/31/2020 8.7 (L)   Calcium, Total (mg/dL)  Date Value  04/19/2014 9.3   Albumin (g/dL)  Date Value  03/30/2020 4.0  04/19/2014 3.8   Phosphorus (mg/dL)  Date Value  03/31/2020 1.7 (L)   Sodium (mmol/L)  Date Value  03/31/2020 133 (L)  04/19/2014 129 (L)    Assessment: Patient is a 54 y/o F with medical history including stage III vulvar intraepithelial neoplasia, pancreatitis, ischemic cardiomyopathy, diabetes, CHF admitted with diabetic ketoacidosis. Pharmacy has been consulted to assist with electrolyte monitor and replacement.   DKA treatment protocol was initiated. Patient has been transitioned off IV insulin.   Scr 2.2 >> 1.93 >> 1.07  Goal of Therapy:  Electrolytes WNL   Plan:  --Sodium improving, AKI improving.  --Will replace phosphorus with PhosNaK 1 packet tid x 3.  --Will recheck electrolytes with AM labs.   Paulina Fusi, PharmD, BCPS 03/31/2020 10:17 AM

## 2020-03-31 NOTE — Progress Notes (Addendum)
NAME:  Renee Mack, MRN:  [id number], DOB:  20-Jun-1966, LOS: 1 ADMISSION DATE:  03/30/2020, CONSULTATION DATE:  03/30/20 REFERRING MD:  Dr. Karma Greaser, CHIEF COMPLAINT:  Nausea/ vomiting  Brief History   54 yo female admitted with severe metabolic acidosis secondary to DKA requiring insulin gtt   Past Medical History  COPD- no home O2 Vulvar intraepithelial neoplasia III Pancreatitis MI (2019, DES to LAD x 3) T2DM Ischemic cardiomyopathy GERD CAD Chronic combined systolic and diastolic heart failure Cervical disc disease  Significant Hospital Events   10/10: Pt admitted to ICU with DKA   Consults:    Procedures:    Significant Diagnostic Tests:  10/10 CT chest & abdomen w/o contrast >> multifocal lower lung ground glass opacities, consistent with atypical infection & 6 mm lingular nodule & emphysema/ hepatic steatosis/ aortic atherosclerosis/ GERD with possible esophagitis/ ventral abdominal wall hernias/ non specific gastric distension- pancreas WNL   Micro Data:  COVID -19 10/10 >> negative Influenza A/B 10/10 >> negative BC 10/10 >> UC 10/10 >> negative  Antimicrobials:  Cefepime 10/10 >> 10/11  Interim history/subjective:  Patient sleeping in bed on my arrival.  Once awakened alert and oriented, becoming tearful complaining of pain.  Discussed plan of care, all questions answered.  Labs/Imaging personally reviewed Net + 1L Na+: 133 - improved K+: 4.4 Serum CO2: 11 BUN/ Cr.: 40/1.07 TMAX/ WBC: 36.5 / 20.8  Objective   Blood pressure 115/63, pulse (!) 118, temperature 97.7 F (36.5 C), temperature source Oral, resp. rate (!) 21, height 5\' 1"  (1.549 m), weight 56.7 kg, SpO2 99 %.        Intake/Output Summary (Last 24 hours) at 03/31/2020 0956 Last data filed at 03/30/2020 2200 Gross per 24 hour  Intake 0 ml  Output --  Net 0 ml   Filed Weights   03/30/20 0255  Weight: 56.7 kg    Examination: General: Adult female, ill appearing, NAD HEENT: MM  pink/moist, anicteric Neuro: Alert & oriented, follows commands, MAE, PERRL  CV: s1s2, RRR, no m/r/g, pulses + 2 R/P Pulm: regular, non labored on RA, breath sounds clear- BUL & coarse/diminished-BLL GI: soft, hernia present, non tender, bs x 4 Skin: known vulvular cancer, no rashes/lesions noted Extremities: warm/dry, no edema noted, generalized weakness  Resolved Hospital Problem list     Assessment & Plan:  Diabetes Type 2 with resolved Diabetic ketoacidosis  Patient converted off of insulin drip 10/10, Diabetes coordinator consulted- patient reports taking 100 units lantus BID and 35 units of novalog AC,HS  - Q 4 AC, HS CBG monitoring - increased SSI to resistant scale - Increased lantus 35 Units BID, added 10 units Novalog standing dose TID per DC recs - Daily BMP - f/u A1C - continue IVF - Strict I/O  Ischemic cardiomyopathy  Hx: Chronic combined systolic and diastolic CHF, hypotension, CAD, hypotension, and hypotension   - continuous cardiac monitoring - home medications restarted now that pt is tolerating PO: aspirin, atorvastatin  Hyponatremia and acute renal failure secondary to dehydration Lactic acidosis   Metabolic acidosis secondary to DKA  Na+ trending up, lactic acid trending down, patient converted off insulin drip overnight 10/10-10/11.  - continue NS IVF, patient completing 3 doses of potassium/sodium phosphates - continue to replace PRN - daily BMP - avoid nephrotoxic agents, ensure adequate renal perfusion - monitor I/O  Leukocytosis Elevated lipase  Hx: Pancreatitis  Chest CT showed lower lobe ground glass opacities, possible atypical pneumonia. 10/11- PCT 0.38, WBC trending down, afebrile. CT  abd, negative for pancreatitis  - trend PCT, WBC and monitor fever curve - trend hepatic panel - received 2 doses of cefepime, will monitor for now low threshold to restart ABX if needed  Acute pain  Hx: Chronic pain secondary to vulvar intraepithelial  neoplasia III Confirmed home medication doses, restarted outpatient narcotics  - continue Oxycodone & Oxycontin per outpatient regimen - continuous SpO2 monitoring  Pulmonary nodule New 6 mm lingular nodule seen on chest CT  - recommend f/u outpatient Best practice:  Diet: Carb modified Pain/Anxiety/Delirium protocol (if indicated): home medication regimen VAP protocol (if indicated): N/A DVT prophylaxis: heparin SQ GI prophylaxis: N/a Glucose control: AC, HS- SSI + standing dose + lantus BID Mobility: mobilize as tolerated Code Status: FULL Family Communication: Patient updated bedside, all questions answered- 10/11 Disposition: Med-surg  Labs   CBC: Recent Labs  Lab 03/30/20 0304 03/31/20 0223  WBC 22.6* 20.8*  NEUTROABS 14.6* 18.5*  HGB 17.6* 15.2*  HCT 55.1* 42.8  MCV 99.5 89.9  PLT 353 951    Basic Metabolic Panel: Recent Labs  Lab 03/30/20 0304 03/30/20 0425 03/30/20 0500 03/30/20 1640 03/30/20 2021 03/31/20 0223  NA 124* 127*  --  130* 133* 134*  K 5.0 4.7  --  3.6 3.4* 4.5  CL 89* 92*  --  102 104 108  CO2 8* 8*  --  17* 18* 13*  GLUCOSE 864* 787*  --  245* 215* 284*  BUN 47* 46*  --  62* 59* 48*  CREATININE 2.20* 1.93*  --  1.09* 1.11* 1.12*  CALCIUM 9.0 8.9  --  9.3 9.4 8.8*  MG 2.4  --  2.3  --   --  2.1  PHOS  --   --  5.8*  --   --  1.7*   GFR: Estimated Creatinine Clearance: 42.3 mL/min (A) (by C-G formula based on SCr of 1.12 mg/dL (H)). Recent Labs  Lab 03/30/20 0304 03/30/20 0425 03/30/20 1643 03/31/20 0223  PROCALCITON  --  0.38  --   --   WBC 22.6*  --   --  20.8*  LATICACIDVEN 2.6*  --  1.5  --     Liver Function Tests: Recent Labs  Lab 03/30/20 0304  AST 16  ALT 7  ALKPHOS 94  BILITOT 3.5*  PROT 8.8*  ALBUMIN 4.0   Recent Labs  Lab 03/30/20 0304  LIPASE 631*   No results for input(s): AMMONIA in the last 168 hours.  ABG    Component Value Date/Time   HCO3 6.0 (L) 03/30/2020 0304   ACIDBASEDEF 24.8 (H)  03/30/2020 0304   O2SAT 50.8 03/30/2020 0304     Coagulation Profile: Recent Labs  Lab 03/30/20 0304  INR 1.2    Cardiac Enzymes: No results for input(s): CKTOTAL, CKMB, CKMBINDEX, TROPONINI in the last 168 hours.  HbA1C: Hemoglobin A1C  Date/Time Value Ref Range Status  04/26/2019 11:30 AM 14.0 (A) 4.0 - 5.6 % Final   Hgb A1c MFr Bld  Date/Time Value Ref Range Status  07/27/2018 05:27 AM 12.6 (H) 4.8 - 5.6 % Final    Comment:    (NOTE) Pre diabetes:          5.7%-6.4% Diabetes:              >6.4% Glycemic control for   <7.0% adults with diabetes   05/22/2018 03:38 AM 11.9 (H) 4.8 - 5.6 % Final    Comment:    (NOTE) Pre diabetes:  5.7%-6.4% Diabetes:              >6.4% Glycemic control for   <7.0% adults with diabetes     CBG: Recent Labs  Lab 03/30/20 2143 03/30/20 2320 03/31/20 0219 03/31/20 0544 03/31/20 0821  GLUCAP 221* 258* 287* 341* 347*    Critical care time:      Domingo Pulse [place], AGACNP-BC [facility]    Please see Amion for pager details.

## 2020-03-31 NOTE — Progress Notes (Signed)
Pharmacy Antibiotic Note  Renee Mack is a 54 y.o. female admitted on 03/30/2020. Pharmacy has been consulted for cefepime dosing.  Plan: Renal function is improving, will increase Cefepime 2 g IV q12h for current renal function  Height: 5\' 1"  (154.9 cm) Weight: 56.7 kg (125 lb) IBW/kg (Calculated) : 47.8  Temp (24hrs), Avg:97.7 F (36.5 C), Min:97.7 F (36.5 C), Max:97.7 F (36.5 C)  Recent Labs  Lab 03/30/20 0304 03/30/20 0304 03/30/20 0425 03/30/20 1640 03/30/20 1643 03/30/20 2021 03/31/20 0223 03/31/20 0929  WBC 22.6*  --   --   --   --   --  20.8*  --   CREATININE 2.20*   < > 1.93* 1.09*  --  1.11* 1.12* 1.07*  LATICACIDVEN 2.6*  --   --   --  1.5  --   --   --    < > = values in this interval not displayed.    Estimated Creatinine Clearance: 44.3 mL/min (A) (by C-G formula based on SCr of 1.07 mg/dL (H)).    Allergies  Allergen Reactions  . Bee Venom Itching, Shortness Of Breath and Swelling  . Metformin And Related Shortness Of Breath and Swelling  . Darvon [Propoxyphene] Itching  . Gabapentin Swelling  . Nsaids Other (See Comments)    Ulcers   . Tramadol Hives  . Contrast Media [Iodinated Diagnostic Agents] Rash    If you benadryl, and steroids she is able to take the contrast per pt    Antimicrobials this admission: Cefepime 10/10 >>   Microbiology results: 10/10 BCx: NG 12hr 10/10 UCx: pending   Thank you for allowing pharmacy to be a part of this patient's care.  Paulina Fusi, PharmD, BCPS 03/31/2020 10:30 AM

## 2020-03-31 NOTE — Care Management (Addendum)
Brief Note for Transfer of Care from PCCM to [facility] service:   54 yo female with a complex past medical history of several comorbidities including Stage III Vulvar Intraepithelial Neoplasia, Urinary Incontinence, Tobacco Abuse, Spinal Stenosis, Shingles, Pancreatitis, MI, Ischemic Cardiomyopathy (Echo 2019: EF 30-35%), HTN, Degenerative Disc Disease, GERD, CAD, COPD, Chronic Combined Systolic and Diastolic CHF, Cervical Disc Disease, chronic opioid use.  Admitted to PCCM service on 10/10 with DKA after presenting with nausea/vomiting and generalized pain.  She was treated with insulin infusion, and has since been transitioned off drip to subcutaneous insulin.   She is still on hold for a bed in the ED at this time, now med/surg status.  Of note, patient has significantly high insulin requirements and may benefit from medication for insulin resistance if not on one already.  Diabetes coordinator is following.  Her home opioid regimen has been verified by [facility] service and resumed.    Currently off antibiotics with procalcitonin pending, but was treated with Cefepime initially due to ground glass opacities seen on imaging.  Unclear if truly infection, will need to reassess if antibiotics indicated.  Monitor clinically for now.   Will consult pharmacy for electrolyte replacement.   [place] service will assume care of patient tomorrow, 04/01/2020.

## 2020-03-31 NOTE — Progress Notes (Signed)
PHARMACIST - PHYSICIAN COMMUNICATION CONCERNING: IV to Oral Route Change Policy  RECOMMENDATION: This patient is receiving famotidine by the intravenous route.  Based on criteria approved by the Pharmacy and Therapeutics Committee, the intravenous medication(s) is/are being converted to the equivalent oral dose form(s).   DESCRIPTION: These criteria include:  The patient is eating (either orally or via tube) and/or has been taking other orally administered medications for a least 24 hours  The patient has no evidence of active gastrointestinal bleeding or impaired GI absorption (gastrectomy, short bowel, patient on TNA or NPO).  If you have questions about this conversion, please contact the Pharmacy Department  []   [phone] )  [facility] [x]   [phone] )  [facility] []   [phone] )  [facility] []   [phone] )  [facility] []   [phone] )  [place], [facility] 03/31/2020 10:31 AM

## 2020-03-31 NOTE — ED Notes (Signed)
Report called to Brianna, RN

## 2020-03-31 NOTE — ED Notes (Signed)
Assumed care of pt at 1900, pt AO x4. Talking in full sentences with regular and unlabored breathing. Denies needs or concerns at this time.

## 2020-04-01 DIAGNOSIS — E1110 Type 2 diabetes mellitus with ketoacidosis without coma: Principal | ICD-10-CM

## 2020-04-01 LAB — GLUCOSE, CAPILLARY
Glucose-Capillary: 133 mg/dL — ABNORMAL HIGH (ref 70–99)
Glucose-Capillary: 124 mg/dL — ABNORMAL HIGH (ref 70–99)
Glucose-Capillary: 28 mg/dL — CL (ref 70–99)
Glucose-Capillary: 30 mg/dL — CL (ref 70–99)
Glucose-Capillary: 214 mg/dL — ABNORMAL HIGH (ref 70–99)
Glucose-Capillary: 172 mg/dL — ABNORMAL HIGH (ref 70–99)

## 2020-04-01 LAB — BASIC METABOLIC PANEL
Anion gap: 6 (ref 5–15)
BUN: 34 mg/dL — ABNORMAL HIGH (ref 6–20)
CO2: 20 mmol/L — ABNORMAL LOW (ref 22–32)
Calcium: 8.7 mg/dL — ABNORMAL LOW (ref 8.9–10.3)
Chloride: 112 mmol/L — ABNORMAL HIGH (ref 98–111)
Creatinine, Ser: 0.63 mg/dL (ref 0.44–1.00)
GFR, Estimated: >60 mL/min (ref >60)
Glucose, Bld: 139 mg/dL — ABNORMAL HIGH (ref 70–99)
Potassium: 3.1 mmol/L — ABNORMAL LOW (ref 3.5–5.1)
Sodium: 138 mmol/L (ref 135–145)

## 2020-04-01 LAB — CBC
HCT: 40.1 % (ref 36.0–46.0)
Hemoglobin: 14.1 g/dL (ref 12.0–15.0)
MCH: 32.1 pg (ref 26.0–34.0)
MCHC: 35.2 g/dL (ref 30.0–36.0)
MCV: 91.3 fL (ref 80.0–100.0)
Platelets: 170 10*3/uL (ref 150–400)
RBC: 4.39 MIL/uL (ref 3.87–5.11)
RDW: 14.2 % (ref 11.5–15.5)
WBC: 11 10*3/uL — ABNORMAL HIGH (ref 4.0–10.5)
nRBC: 0 % (ref 0.0–0.2)

## 2020-04-01 LAB — HEPATIC FUNCTION PANEL
ALT: <5 U/L (ref 0–44)
AST: 8 U/L — ABNORMAL LOW (ref 15–41)
Albumin: 2.8 g/dL — ABNORMAL LOW (ref 3.5–5.0)
Alkaline Phosphatase: 57 U/L (ref 38–126)
Bilirubin, Direct: <0.1 mg/dL (ref 0.0–0.2)
Total Bilirubin: 0.6 mg/dL (ref 0.3–1.2)
Total Protein: 6.3 g/dL — ABNORMAL LOW (ref 6.5–8.1)

## 2020-04-01 LAB — PROCALCITONIN: Procalcitonin: 0.41 ng/mL

## 2020-04-01 LAB — PHOSPHORUS: Phosphorus: 2 mg/dL — ABNORMAL LOW (ref 2.5–4.6)

## 2020-04-01 LAB — MAGNESIUM: Magnesium: 2.5 mg/dL — ABNORMAL HIGH (ref 1.7–2.4)

## 2020-04-01 MED ORDER — PANTOPRAZOLE SODIUM 40 MG PO TBEC
40.0000 mg | DELAYED_RELEASE_TABLET | Freq: Every day | ORAL | Status: DC
  Administered 2020-04-01 – 2020-04-02 (×2): 40 mg via ORAL
  Filled 2020-04-01 (×2): qty 1

## 2020-04-01 MED ORDER — INSULIN ASPART 100 UNIT/ML ~~LOC~~ SOLN
0.0000 [IU] | Freq: Three times a day (TID) | SUBCUTANEOUS | Status: DC
  Administered 2020-04-03: 2 [IU] via SUBCUTANEOUS
  Administered 2020-04-04: 1 [IU] via SUBCUTANEOUS
  Administered 2020-04-04: 2 [IU] via SUBCUTANEOUS
  Administered 2020-04-05 (×2): 3 [IU] via SUBCUTANEOUS
  Filled 2020-04-01 (×5): qty 1

## 2020-04-01 MED ORDER — INSULIN ASPART 100 UNIT/ML ~~LOC~~ SOLN: 0.0000 [IU] | Freq: Every day | SUBCUTANEOUS | Status: DC

## 2020-04-01 MED ORDER — INFLUENZA VAC SPLIT QUAD 0.5 ML IM SUSY
0.5000 mL | PREFILLED_SYRINGE | INTRAMUSCULAR | Status: DC
  Filled 2020-04-01: qty 0.5

## 2020-04-01 MED ORDER — CLONAZEPAM 0.5 MG PO TABS: 0.5000 mg | ORAL_TABLET | Freq: Three times a day (TID) | ORAL | Status: DC | PRN

## 2020-04-01 MED ORDER — POTASSIUM PHOSPHATES 15 MMOLE/5ML IV SOLN
20.0000 mmol | Freq: Once | INTRAVENOUS | Status: AC
  Administered 2020-04-01: 20 mmol via INTRAVENOUS
  Filled 2020-04-01: qty 6.67

## 2020-04-01 MED ORDER — POTASSIUM CHLORIDE CRYS ER 20 MEQ PO TBCR
40.0000 meq | EXTENDED_RELEASE_TABLET | ORAL | Status: AC
  Administered 2020-04-01 (×2): 40 meq via ORAL
  Filled 2020-04-01 (×2): qty 2

## 2020-04-01 MED ORDER — CALCIUM CARBONATE ANTACID 500 MG PO CHEW
1.0000 | CHEWABLE_TABLET | Freq: Three times a day (TID) | ORAL | Status: DC | PRN
  Administered 2020-04-02 – 2020-04-03 (×3): 200 mg via ORAL
  Filled 2020-04-01 (×3): qty 1

## 2020-04-01 NOTE — TOC Initial Note (Signed)
Transition of Care [facility]) - Initial/Assessment Note    Patient Details  Name: Renee Mack MRN: [id number] Date of Birth: 06-20-1966  [facility] CM/SW Contact:    Candie Chroman, LCSW Phone Number: 04/01/2020, 12:03 PM  Clinical Narrative:  Readmission prevention screen complete. CSW met with patient. No supports at bedside. CSW introduced role and explained that discharge planning would be discussed. PCP is Nobie Putnam, DO. Patient has transportation to appointments. Pharmacy is Paediatric nurse on [address]. No issues obtaining medications. No home health or DME use prior to admission. No further concerns. CSW encouraged patient to contact CSW as needed. CSW will continue to follow patient for support and facilitate return home when stable.                Expected Discharge Plan: Home/Self Care Barriers to Discharge: Continued Medical Work up   Patient Goals and CMS Choice        Expected Discharge Plan and Services Expected Discharge Plan: Home/Self Care     Post Acute Care Choice: NA Living arrangements for the past 2 months: Apartment                                      Prior Living Arrangements/Services Living arrangements for the past 2 months: Apartment   Patient language and need for interpreter reviewed:: Yes Do you feel safe going back to the place where you live?: Yes            Criminal Activity/Legal Involvement Pertinent to Current Situation/Hospitalization: No - Comment as needed  Activities of Daily Living Home Assistive Devices/Equipment: None ADL Screening (condition at time of admission) Patient's cognitive ability adequate to safely complete daily activities?: Yes Is the patient deaf or have difficulty hearing?: No Does the patient have difficulty seeing, even when wearing glasses/contacts?: No Does the patient have difficulty concentrating, remembering, or making decisions?: No Patient able to express need for  assistance with ADLs?: Yes Does the patient have difficulty dressing or bathing?: No Independently performs ADLs?: Yes (appropriate for developmental age) Does the patient have difficulty walking or climbing stairs?: No Weakness of Legs: Both Weakness of Arms/Hands: None  Permission Sought/Granted                  Emotional Assessment Appearance:: Appears stated age Attitude/Demeanor/Rapport: Engaged, Gracious Affect (typically observed): Accepting, Appropriate, Calm, Pleasant Orientation: : Oriented to Self, Oriented to Place, Oriented to  Time, Oriented to Situation Alcohol / Substance Use: Not Applicable Psych Involvement: No (comment)  Admission diagnosis:  Diabetic ketoacidosis ([place]) [E11.10] Vulvar cancer ([place]) [C51.9] DKA (diabetic ketoacidosis) ([place]) [E11.10] Elevated lactic acid level [R79.89] Acute renal failure, unspecified acute renal failure type ([place]) [N17.9] Diabetic ketoacidosis without coma associated with type 2 diabetes mellitus (HCC) [E11.10] Chronic pain due to neoplasm [G89.3] Intractable vomiting with nausea, unspecified vomiting type [R11.2] Acute pancreatitis, unspecified complication status, unspecified pancreatitis type [K85.90] Patient Active Problem List   Diagnosis Date Noted  . Diabetic ketoacidosis ([place]) 03/30/2020  . DKA (diabetic ketoacidosis) ([place]) 03/30/2020  . Vulvar cancer ([place]) 07/27/2019  . Palliative care patient 09/04/2018  . Hyperglycemia 05/28/2018  . Vulvar intraepithelial neoplasia (VIN) grade 3 05/24/2018  . History of ST elevation myocardial infarction (STEMI) 02/27/2018  . CAD (coronary artery disease) 02/27/2018  . Hyperlipidemia associated with type 2 diabetes mellitus ([place]) 02/27/2018  . Anxiety associated with depression 02/27/2018  . Chronic  systolic heart failure ([place])   . Chest pain 12/26/2017  . Unstable angina ([place])   . Ischemic cardiomyopathy   . Former smoker   . Protein-calorie malnutrition, severe  07/18/2017  . Uncontrolled type 2 diabetes with neuropathy ([place]) 12/28/2014  . Centrilobular emphysema ([place]) 12/28/2014  . GERD (gastroesophageal reflux disease) 12/28/2014  . History of shingles 12/28/2014  . Hyperlipidemia LDL goal <70 12/28/2014   PCP:  Olin Hauser, DO Pharmacy:   [facility] [place] [address] [place]) [place] [address] Phone: [phone] Fax: [phone]     Social Determinants of Health ([place]) Interventions    Readmission Risk Interventions Readmission Risk Prevention Plan 04/01/2020  Transportation Screening Complete  PCP or Specialist Appt within 3-5 Days Complete  Social Work Consult for [place] Planning/Counseling [place] Not Applicable  Medication Review Press photographer) Complete  Some recent data might be hidden

## 2020-04-01 NOTE — Progress Notes (Signed)
PROGRESS NOTE    Renee Mack   KCM:[id number]  DOB: [birth year]  PCP: Olin Hauser, DO    DOA: 03/30/2020 LOS: 2   Brief Narrative   54 yo female with a complex past medical history of several comorbidities including Stage III Vulvar Intraepithelial Neoplasia, Urinary Incontinence, Tobacco Abuse, Spinal Stenosis, Shingles, Pancreatitis, MI, Ischemic Cardiomyopathy (Echo 2019: EF 30-35%), HTN, Degenerative Disc Disease, GERD, CAD, COPD, Chronic Combined Systolic and Diastolic CHF, Cervical Disc Disease, chronic opioid use.   Admitted to PCCM service on 10/10 with DKA after presenting with nausea/vomiting and generalized pain.  She was treated with insulin infusion, and has since been transitioned off drip to subcutaneous insulin.    Is being treated with IV Cefepime for bibasilar pneumonia on imaging, elevated procal and pt reports cough and generally ill at home prior to admission.  Hospitalist service assumed care of patient on 10/12.       Assessment & Plan   Active Problems:   Diabetic ketoacidosis (HCC)   DKA (diabetic ketoacidosis) (HCC)  Community-acquired Pneumonia - POA, pt with cough and ill-feeling at home, bibasilar ground glass opacities on imaging, elevated procal.  Negative for Covid-19. --Continue Cefepime --Likely can transition to PO antibiotic tomorrow to complete course  Diabetic Ketoacidosis - Resolved.  POA with blood glucose >864, pH 6.97, anion gap 27.   She feels hypoglycemic with normal / controlled  glucose levels.  Insulin-dependent Type 2 Diabetes with Insulin resistance - very poorly controlled with A1c 14.5%. Home insulin regimen is Lantus 100 units BID, Novolog 35 TID AC/HS. --Diabetes coordinator following --Currently on Lantus 35 units BID, Novolog 10 units TID WC  --Reduce sliding coverage to sensitive, added HS sliding as well --NS @ 75 cc/hr until PO intake improves --consider adding medication for insulin resistance  Anion  gap metabolic acidosis / Lactic acidosis - due to dehydration in setting of DKA, resolved. PH on admission was 6.97 with gap of 27, lactic acid initially 2.6.  Hyponatremia - POA, Resolved.  Due to hypovolemia, dehydration, hyperglycemia Hypokalemia / Hypophosphatemia --pharmacy consulted for electrolyte management --K 3.1, giving oral 40 mEqx2 today --phos being replaced  Acute kidney injury - POA, resolved.  Due to pre-renal azotemia in setting of dehydration with DKA. --monitor BMP, IV fluids as above until better PO intake  Vulvar intraepithelial neoplasia III / Vulvar cancer - followed by palliative care at home.  Oncology at [facility]. Chonic pain on opioids - home regimen was verified and resumed.    Ischemic cardiomyopathy / Chronic combined systolic diastolic CHF - appears euvolemic, compensated. EF was 45-50% on left heart cath in Dec 2019. --monitor volume status with IV fluids  --strict I/O's    Coronary artery disease - stable no CP --continued on ASA, Lipitor  Hypotension - MAP's okay, continue IV fluids. History of Hypertension  Leukocytosis - POA, improving. Due to pneumonia and likely reactive in setting of acute illness.  Hx of pancreatitis / elevate lipase - CT abdomen/pelvis did not show signs of pancreatitis, pt without abdominal complaints.  Pulmonary nodule - new 6 mm lingular nodule seen on CT.  Recommendation for follow up CT in 6-12 months    DVT prophylaxis: heparin injection 5,000 Units Start: 03/30/20 0600 SCDs Start: 03/30/20 0429   Diet:  Diet Orders (From admission, onward)    Start     Ordered   03/30/20 2251  Diet Carb Modified Fluid consistency: Thin; Room service appropriate? Yes  Diet effective now  Question Answer Comment  Diet-HS Snack? Nothing   Calorie Level Medium 1600-2000   Fluid consistency: Thin   Room service appropriate? Yes      03/30/20 2250            Code Status: Full Code    Subjective 04/01/20    Pt seen  this AM at bedside. Reports feeling better, but she feels dizzy and weak.  She says she feels hypoglycemic when sugar is controlled because she typically runs so high.  Says she had been feeling ill with a cough at home prior to admission.  Feeling better overall.     Disposition Plan & Communication   Status is: Inpatient  Remains inpatient appropriate because:IV treatments appropriate due to intensity of illness or inability to take PO.  On IV antibiotics for bilateral PNA, and has poor PO intake.  D/C home once adequate oral intake, ambulating safely, stable electrolytes and transitioned to PO antibiotics,   Dispo: The patient is from: Home              Anticipated d/c is to: Home              Anticipated d/c date is: 1 day              Patient currently is not medically stable to d/c.   Family Communication: none at bedside, will attempt to call this afternoon    Consults, Procedures, Significant Events   Consultants:   None  Procedures:   None  Antimicrobials:  Anti-infectives (From admission, onward)   Start     Dose/Rate Route Frequency Ordered Stop   03/31/20 2200  ceFEPIme (MAXIPIME) 2 g in sodium chloride 0.9 % 100 mL IVPB        2 g 200 mL/hr over 30 Minutes Intravenous Every 12 hours 03/31/20 1032     03/30/20 0800  ceFEPIme (MAXIPIME) 2 g in sodium chloride 0.9 % 100 mL IVPB  Status:  Discontinued        2 g 200 mL/hr over 30 Minutes Intravenous Every 24 hours 03/30/20 0718 03/31/20 1032        Objective   Vitals:   03/31/20 2101 03/31/20 2346 04/01/20 0743 04/01/20 1202  BP: (!) 117/58 (!) 112/59 98/65 (!) 113/59  Pulse: 79 84 79 81  Resp: 18 16 18 16   Temp: 98.1 F (36.7 C) 97.8 F (36.6 C) 97.6 F (36.4 C) 98.6 F (37 C)  TempSrc: Oral  Oral Oral  SpO2: 99% 98% 98% 100%  Weight:      Height:        Intake/Output Summary (Last 24 hours) at 04/01/2020 1431 Last data filed at 04/01/2020 1020 Gross per 24 hour  Intake 0 ml  Output --  Net 0  ml   Filed Weights   03/30/20 0255  Weight: 56.7 kg    Physical Exam:  General exam: awake, alert, no acute distress Respiratory system: CTAB with overall decreased breath sounds, no wheezes or rhonchi, normal respiratory effort. Cardiovascular system: normal S1/S2, RRR, no pedal edema.   Gastrointestinal system: soft, NT, ND Central nervous system: A&O x3. no gross focal neurologic deficits, normal speech Extremities: moves all, no cyanosis, normal tone Psychiatry: normal mood, congruent affect, judgement and insight appear normal  Labs   Data Reviewed: I have personally reviewed following labs and imaging studies  CBC: Recent Labs  Lab 03/30/20 0304 03/31/20 0223 04/01/20 0606  WBC 22.6* 20.8* 11.0*  NEUTROABS 14.6* 18.5*  --  HGB 17.6* 15.2* 14.1  HCT 55.1* 42.8 40.1  MCV 99.5 89.9 91.3  PLT 353 214 081   Basic Metabolic Panel: Recent Labs  Lab 03/30/20 0304 03/30/20 0425 03/30/20 0500 03/30/20 1640 03/30/20 2021 03/31/20 0223 03/31/20 0929 04/01/20 0606  NA 124*   < >  --  130* 133* 134* 133* 138  K 5.0   < >  --  3.6 3.4* 4.5 4.4 3.1*  CL 89*   < >  --  102 104 108 107 112*  CO2 8*   < >  --  17* 18* 13* 11* 20*  GLUCOSE 864*   < >  --  245* 215* 284* 367* 139*  BUN 47*   < >  --  62* 59* 48* 40* 34*  CREATININE 2.20*   < >  --  1.09* 1.11* 1.12* 1.07* 0.63  CALCIUM 9.0   < >  --  9.3 9.4 8.8* 8.7* 8.7*  MG 2.4  --  2.3  --   --  2.1  --  2.5*  PHOS  --   --  5.8*  --   --  1.7*  --  2.0*   < > = values in this interval not displayed.   GFR: Estimated Creatinine Clearance: 59.3 mL/min (by C-G formula based on SCr of 0.63 mg/dL). Liver Function Tests: Recent Labs  Lab 03/30/20 0304 04/01/20 0606  AST 16 8*  ALT 7 <5  ALKPHOS 94 57  BILITOT 3.5* 0.6  PROT 8.8* 6.3*  ALBUMIN 4.0 2.8*   Recent Labs  Lab 03/30/20 0304  LIPASE 631*   No results for input(s): AMMONIA in the last 168 hours. Coagulation Profile: Recent Labs  Lab  03/30/20 0304  INR 1.2   Cardiac Enzymes: No results for input(s): CKTOTAL, CKMB, CKMBINDEX, TROPONINI in the last 168 hours. BNP (last 3 results) No results for input(s): PROBNP in the last 8760 hours. HbA1C: Recent Labs    03/31/20 0223  HGBA1C 14.5*   CBG: Recent Labs  Lab 03/31/20 1204 03/31/20 1638 03/31/20 2230 04/01/20 0821 04/01/20 1203  GLUCAP 290* 147* 90 133* 124*   Lipid Profile: No results for input(s): CHOL, HDL, LDLCALC, TRIG, CHOLHDL, LDLDIRECT in the last 72 hours. Thyroid Function Tests: No results for input(s): TSH, T4TOTAL, FREET4, T3FREE, THYROIDAB in the last 72 hours. Anemia Panel: No results for input(s): VITAMINB12, FOLATE, FERRITIN, TIBC, IRON, RETICCTPCT in the last 72 hours. Sepsis Labs: Recent Labs  Lab 03/30/20 0304 03/30/20 0425 03/30/20 1643 04/01/20 0606  PROCALCITON  --  0.38  --  0.41  LATICACIDVEN 2.6*  --  1.5  --     Recent Results (from the past 240 hour(s))  Culture, blood (Routine X 2) w Reflex to ID Panel     Status: None (Preliminary result)   Collection Time: 03/30/20  2:23 AM   Specimen: BLOOD  Result Value Ref Range Status   Specimen Description BLOOD BLOOD RIGHT FOREARM  Final   Special Requests   Final    BOTTLES DRAWN AEROBIC AND ANAEROBIC Blood Culture results may not be optimal due to an inadequate volume of blood received in culture bottles   Culture   Final    NO GROWTH 1 DAY Performed at [facility], [address], [place], [place] [address]    Report Status PENDING  Incomplete  Urine culture     Status: None   Collection Time: 03/30/20  3:04 AM   Specimen: Urine, Random  Result  Value Ref Range Status   Specimen Description   Final    URINE, RANDOM Performed at [facility], [address], [place], [place] [address]    Special Requests   Final    NONE Performed at [facility], [address] [place], [place] [address]    Culture   Final    NO GROWTH Performed at  [facility], [place] [address] [place], [place] [address]    Report Status 03/31/2020 FINAL  Final  Blood Culture (routine x 2)     Status: None (Preliminary result)   Collection Time: 03/30/20  3:05 AM   Specimen: BLOOD  Result Value Ref Range Status   Specimen Description BLOOD BLOOD LEFT FOREARM  Final   Special Requests   Final    BOTTLES DRAWN AEROBIC AND ANAEROBIC Blood Culture results may not be optimal due to an inadequate volume of blood received in culture bottles   Culture   Final    NO GROWTH 2 DAYS Performed at [facility], [address] [place], [place] [address]    Report Status PENDING  Incomplete  Respiratory Panel by RT PCR (Flu A&B, Covid) - Nasopharyngeal Swab     Status: None   Collection Time: 03/30/20  3:05 AM   Specimen: Nasopharyngeal Swab  Result Value Ref Range Status   SARS Coronavirus 2 by RT PCR NEGATIVE NEGATIVE Final    Comment: (NOTE) SARS-CoV-2 target nucleic acids are NOT DETECTED.  The SARS-CoV-2 RNA is generally detectable in upper respiratoy specimens during the acute phase of infection. The lowest concentration of SARS-CoV-2 viral copies this assay can detect is 131 copies/mL. A negative result does not preclude SARS-Cov-2 infection and should not be used as the sole basis for treatment or other patient management decisions. A negative result may occur with  improper specimen collection/handling, submission of specimen other than nasopharyngeal swab, presence of viral mutation(s) within the areas targeted by this assay, and inadequate number of viral copies (<131 copies/mL). A negative result must be combined with clinical observations, patient history, and epidemiological information. The expected result is Negative.  Fact Sheet for Patients:  PinkCheek.be  Fact Sheet for Healthcare Providers:  GravelBags.it  This test is no t yet approved or cleared by the  [place] FDA and  has been authorized for detection and/or diagnosis of SARS-CoV-2 by FDA under an Emergency Use Authorization (EUA). This EUA will remain  in effect (meaning this test can be used) for the duration of the COVID-19 declaration under Section 564(b)(1) of the Act, 21 U.S.C. section 360bbb-3(b)(1), unless the authorization is terminated or revoked sooner.     Influenza A by PCR NEGATIVE NEGATIVE Final   Influenza B by PCR NEGATIVE NEGATIVE Final    Comment: (NOTE) The Xpert Xpress SARS-CoV-2/FLU/RSV assay is intended as an aid in  the diagnosis of influenza from Nasopharyngeal swab specimens and  should not be used as a sole basis for treatment. Nasal washings and  aspirates are unacceptable for Xpert Xpress SARS-CoV-2/FLU/RSV  testing.  Fact Sheet for Patients: PinkCheek.be  Fact Sheet for Healthcare Providers: GravelBags.it  This test is not yet approved or cleared by the [place] FDA and  has been authorized for detection and/or diagnosis of SARS-CoV-2 by  FDA under an Emergency Use Authorization (EUA). This EUA will remain  in effect (meaning this test can be used) for the duration of the  Covid-19 declaration under Section 564(b)(1) of the Act, 21  U.S.C.  section 360bbb-3(b)(1), unless the authorization is  terminated or revoked. Performed at [facility], [address] [place], [place] [address]       Imaging Studies   No results found.   Medications   Scheduled Meds: . amitriptyline  75 mg Oral QHS  . aspirin EC  81 mg Oral Daily  . atorvastatin  80 mg Oral q1800  . citalopram  40 mg Oral Daily  . docusate sodium  100 mg Oral Daily  . famotidine  20 mg Oral Daily  . heparin  5,000 Units Subcutaneous Q8H  . [START ON 04/02/2020] influenza vac split quadrivalent PF  0.5 mL Intramuscular Tomorrow-1000  . insulin aspart  0-20 Units Subcutaneous TID AC & HS  . insulin  aspart  10 Units Subcutaneous TID AC  . insulin glargine  35 Units Subcutaneous BID  . mirtazapine  15 mg Oral QHS  . oxyCODONE  60 mg Oral TID  . pantoprazole  40 mg Oral Daily  . potassium chloride  40 mEq Oral Q4H  . ranolazine  1,000 mg Oral BID  . senna  1 tablet Oral Daily  . sucralfate  1 g Oral QID   Continuous Infusions: . sodium chloride 75 mL/hr at 03/30/20 2211  . ceFEPime (MAXIPIME) IV 2 g (04/01/20 0915)  . potassium PHOSPHATE IVPB (in mmol) 20 mmol (04/01/20 1130)       LOS: 2 days    Time spent: 30 minutes    Ezekiel Slocumb, DO [facility]  04/01/2020, 2:31 PM    If 7PM-7AM, please contact night-coverage. How to contact the [facility] Attending or Consulting provider [place] or covering provider during after hours [place], for this patient?    1. Check the care team in [facility] and look for a) attending/consulting TR[facility] provider listed and b) the [facility] team listed 2. Log into [email or web address] and use [place]'s universal password to access. If you do not have the password, please contact the hospital operator. 3. Locate the [facility] provider you are looking for under [facility] and page to a number that you can be directly reached. 4. If you still have difficulty reaching the provider, please page the [facility] (Director on Call) for the Hospitalists listed on amion for assistance.

## 2020-04-01 NOTE — Progress Notes (Addendum)
Hypoglycemic Event  CBG: 28  Treatment: D50 50 mL (25 gm)  Symptoms: sleepy  Follow-up CBG: Time: 1740  CBG Result: 214  Possible Reasons for Event: Inadequate meal intake - patient reports that she didn't eat a lot of her salad from lunch.   Comments/MD notified: Dr. Arbutus Ped notified via secure chat.     Oswaldo Milian

## 2020-04-01 NOTE — Hospital Course (Addendum)
54 yo female with a complex past medical history of several comorbidities including Stage III Vulvar Intraepithelial Neoplasia, Urinary Incontinence, Tobacco Abuse, Spinal Stenosis, Shingles, Pancreatitis, MI, Ischemic Cardiomyopathy (Echo 2019: EF 30-35%), HTN, Degenerative Disc Disease, GERD, CAD, COPD, Chronic Combined Systolic and Diastolic CHF, Cervical Disc Disease, chronic opioid use.   Admitted to PCCM service on 10/10 with DKA after presenting with nausea/vomiting and generalized pain.  She was treated with insulin infusion, and has since been transitioned off drip to subcutaneous insulin.    Is being treated with IV Cefepime for bibasilar pneumonia on imaging, elevated procal and pt reports cough and generally ill at home prior to admission.  Hospitalist service assumed care of patient on 10/12.

## 2020-04-01 NOTE — Progress Notes (Signed)
Inpatient Diabetes Program Recommendations  AACE/ADA: New Consensus Statement on Inpatient Glycemic Control (2015)  Target Ranges:  Prepandial:   less than 140 mg/dL      Peak postprandial:   less than 180 mg/dL (1-2 hours)      Critically ill patients:  140 - 180 mg/dL   Lab Results  Component Value Date   GLUCAP 133 (H) 04/01/2020   HGBA1C 14.5 (H) 03/31/2020    Review of Glycemic Control Results for Renee Mack, Renee Mack (MRN [id number]) as of 04/01/2020 11:32  Ref. Range 03/31/2020 08:21 03/31/2020 12:04 03/31/2020 16:38 03/31/2020 22:30 04/01/2020 08:21  Glucose-Capillary Latest Ref Range: 70 - 99 mg/dL 347 (H) 290 (H) 147 (H) 90 133 (H)   Diabetes history: DM2 Outpatient Diabetes medications: Lantus 70 units bid + Novolog 35 units tid meal coverage Current orders for Inpatient glycemic control: Lantus 35 units bid + Novolog resistant correction qid  Inpatient Diabetes Program Recommendations:   -Decrease Novolog correction to sensitive 0-9 units tid + hs 0-5 units  Thank you, Bethena Roys E. Maziyah Vessel, RN, MSN, CDE  Diabetes Coordinator Inpatient Glycemic Control Team Team Pager [phone] (8am-5pm) 04/01/2020 11:37 AM

## 2020-04-01 NOTE — Progress Notes (Signed)
PHARMACY CONSULT NOTE  Pharmacy Consult for Electrolyte Monitoring and Replacement   Recent Labs: Potassium (mmol/L)  Date Value  04/01/2020 3.1 (L)  04/19/2014 4.2   Magnesium (mg/dL)  Date Value  04/01/2020 2.5 (H)   Calcium (mg/dL)  Date Value  04/01/2020 8.7 (L)   Calcium, Total (mg/dL)  Date Value  04/19/2014 9.3   Albumin (g/dL)  Date Value  04/01/2020 2.8 (L)  04/19/2014 3.8   Phosphorus (mg/dL)  Date Value  04/01/2020 2.0 (L)   Sodium (mmol/L)  Date Value  04/01/2020 138  04/19/2014 129 (L)   Corrected Ca: 9.66 mg/dL  Assessment: Patient is a 54 y/o F with medical history including stage III vulvar intraepithelial neoplasia, pancreatitis, ischemic cardiomyopathy, diabetes, CHF admitted with diabetic ketoacidosis. Pharmacy has been consulted to assist with electrolyte monitor and replacement.    Goal of Therapy:  Electrolytes WNL   Plan:   20 mmol IV potassium phosphate (this contains 29 mEq IV potassium)  recheck electrolytes with AM labs.   Vallery Sa, PharmD, BCPS 04/01/2020 8:28 AM

## 2020-04-02 DIAGNOSIS — R7989 Other specified abnormal findings of blood chemistry: Secondary | ICD-10-CM

## 2020-04-02 DIAGNOSIS — G893 Neoplasm related pain (acute) (chronic): Secondary | ICD-10-CM

## 2020-04-02 DIAGNOSIS — C519 Malignant neoplasm of vulva, unspecified: Secondary | ICD-10-CM

## 2020-04-02 DIAGNOSIS — N179 Acute kidney failure, unspecified: Secondary | ICD-10-CM | POA: Diagnosis not present

## 2020-04-02 DIAGNOSIS — E1110 Type 2 diabetes mellitus with ketoacidosis without coma: Secondary | ICD-10-CM | POA: Diagnosis not present

## 2020-04-02 DIAGNOSIS — R112 Nausea with vomiting, unspecified: Secondary | ICD-10-CM

## 2020-04-02 DIAGNOSIS — K859 Acute pancreatitis without necrosis or infection, unspecified: Secondary | ICD-10-CM

## 2020-04-02 LAB — GLUCOSE, CAPILLARY
Glucose-Capillary: 71 mg/dL (ref 70–99)
Glucose-Capillary: 97 mg/dL (ref 70–99)
Glucose-Capillary: 68 mg/dL — ABNORMAL LOW (ref 70–99)
Glucose-Capillary: 79 mg/dL (ref 70–99)
Glucose-Capillary: 52 mg/dL — ABNORMAL LOW (ref 70–99)
Glucose-Capillary: 53 mg/dL — ABNORMAL LOW (ref 70–99)
Glucose-Capillary: 64 mg/dL — ABNORMAL LOW (ref 70–99)
Glucose-Capillary: 97 mg/dL (ref 70–99)

## 2020-04-02 LAB — BASIC METABOLIC PANEL
Anion gap: 6 (ref 5–15)
BUN: 16 mg/dL (ref 6–20)
CO2: 21 mmol/L — ABNORMAL LOW (ref 22–32)
Calcium: 8.7 mg/dL — ABNORMAL LOW (ref 8.9–10.3)
Chloride: 113 mmol/L — ABNORMAL HIGH (ref 98–111)
Creatinine, Ser: 0.48 mg/dL (ref 0.44–1.00)
GFR, Estimated: >60 mL/min (ref >60)
Glucose, Bld: 94 mg/dL (ref 70–99)
Potassium: 3.7 mmol/L (ref 3.5–5.1)
Sodium: 140 mmol/L (ref 135–145)

## 2020-04-02 LAB — PHOSPHORUS: Phosphorus: 2.1 mg/dL — ABNORMAL LOW (ref 2.5–4.6)

## 2020-04-02 LAB — PROCALCITONIN: Procalcitonin: <0.1 ng/mL

## 2020-04-02 LAB — MAGNESIUM: Magnesium: 2.2 mg/dL (ref 1.7–2.4)

## 2020-04-02 MED ORDER — INFLUENZA VAC SPLIT QUAD 0.5 ML IM SUSY
0.5000 mL | PREFILLED_SYRINGE | INTRAMUSCULAR | Status: AC
  Administered 2020-04-03: 0.5 mL via INTRAMUSCULAR
  Filled 2020-04-02: qty 0.5

## 2020-04-02 MED ORDER — INSULIN ASPART PROT & ASPART (70-30 MIX) 100 UNIT/ML ~~LOC~~ SUSP: 42.0000 [IU] | Freq: Two times a day (BID) | SUBCUTANEOUS | Status: DC

## 2020-04-02 MED ORDER — K PHOS MONO-SOD PHOS DI & MONO 155-852-130 MG PO TABS
500.0000 mg | ORAL_TABLET | Freq: Two times a day (BID) | ORAL | Status: AC
  Administered 2020-04-02 (×2): 500 mg via ORAL
  Filled 2020-04-02 (×2): qty 2

## 2020-04-02 MED ORDER — LIVING WELL WITH DIABETES BOOK
Freq: Once | Status: AC
  Filled 2020-04-02 (×2): qty 1

## 2020-04-02 MED ORDER — INSULIN STARTER KIT- PEN NEEDLES (ENGLISH)
1.0000 | Freq: Once | Status: AC
  Administered 2020-04-02: 1
  Filled 2020-04-02: qty 1

## 2020-04-02 MED ORDER — TICAGRELOR 90 MG PO TABS
90.0000 mg | ORAL_TABLET | Freq: Two times a day (BID) | ORAL | Status: DC
  Administered 2020-04-02 – 2020-04-05 (×6): 90 mg via ORAL
  Filled 2020-04-02 (×7): qty 1

## 2020-04-02 NOTE — Progress Notes (Signed)
Hypoglycemic Event  CBG: 53  Treatment:30 grams carbohydrate Symptoms: Sleepy Follow-up CBG: Time: 2122  CBG Result:64  Possible Reasons for Event: Inadequate PO intake  Comments/MD notified: M. [place]

## 2020-04-02 NOTE — Progress Notes (Signed)
  Hypoglycemic Event  CBG: 52  Treatment: 30 grams carbohydrate  Symptoms: Sleepy  Follow-up CBG: Time: 2100  Possible Reasons for Event: Inadequate PO intake Comments/MD notified: M. [place]

## 2020-04-02 NOTE — Progress Notes (Addendum)
Inpatient Diabetes Program Recommendations  AACE/ADA: New Consensus Statement on Inpatient Glycemic Control (2015)  Target Ranges:  Prepandial:   less than 140 mg/dL      Peak postprandial:   less than 180 mg/dL (1-2 hours)      Critically ill patients:  140 - 180 mg/dL   Lab Results  Component Value Date   GLUCAP 71 04/02/2020   HGBA1C 14.5 (H) 03/31/2020    Review of Glycemic Control  Diabetes history:DM2 Outpatient Diabetes medications:Lantus 70 units bid + Novolog 35 units tid meal coverage Current orders for Inpatient glycemic control:Lantus 35 units bid +Novolog 10 units tid meal coverage + Novolog sensitive correction tid + hs 0-5 units  Inpatient Diabetes Program Recommendations:   Noted patient had hypoglycemia after receiving Novolog meal coverage + correction and only ate < 1/2 of a salad. -Decrease Lantus to 30 units bid -Decrease Novolog meal coverage to 5 units tid if eats 50% meal  Secure message received from Dr. Roger Shelter requesting recommendations on dosing of 70/30 insulin mix bid. Patient is not eating consistent amount of carbohydrates so will need to watch for hypoglycemia. Novolog 70/30 mix 42 units bid ac = approximately 58.8 units basal + 25.2 units meal coverage may be a good starting dose for 70/30. Patient prefers insulin pens and has taken insulin by pen in the past and more convenient in taking out to eat with her.  Insulin pen needles: Order # [id number] Novolog 70/30 Flexpen : # [id number]  Spoke with patient and daughter @ bedside. And patient agrees to take 70/30 insulin bid and start eating breakfast prior to taking injection each morning. She says she has actually been taking Lantus 100 units bid instead of the 70 listed & only taking meal coverage 20-22 units after one or 2 meals. Reviewed with patient insulin needs much less since admitted into the hospital. Answered basic nutrition questions along with reviewing hyper and hypoglycemia protocols.  Patient and daughter were able to verbalize understanding.  Thank you, Nani Gasser. Kinaya Hilliker, RN, MSN, CDE  Diabetes Coordinator Inpatient Glycemic Control Team Team Pager [phone] (8am-5pm) 04/02/2020 9:23 AM

## 2020-04-02 NOTE — Care Management Important Message (Signed)
Important Message  Patient Details  Name: Renee Mack MRN: [id number] Date of Birth: 14-Jun-1966   Medicare Important Message Given:  Yes     Dannette Barbara 04/02/2020, 10:09 AM

## 2020-04-02 NOTE — Progress Notes (Signed)
Mobility Specialist - Progress Note   04/02/20 1152  Mobility  Activity Ambulated in room;Transferred to/from BSC  Level of Assistance Contact guard assist, steadying assist  Assistive Device Front wheel walker  Distance Ambulated (ft) 20 ft  Mobility Response Tolerated well  Mobility performed by Mobility specialist  $Mobility charge 1 Mobility    Pre-mobility: 86 HR, 96% SpO2 During mobility: 103 HR, 98% SpO2 Post-mobility: 95 HR, 97% SpO2  Pt laying in bed upon arrival. Pt agreed to session. Pt a&o x4. Pt states she was having "9/10" lower back pain. However, it does not limit her mobility. Pt requested to go to [facility]. Pt transferred to [facility] w/ CGA. Pt able to perform personal hygiene mod. Independently. Pt progressed to ambulate ~20' total in room using a RW w/ CGA. Pt had a slow gait pattern d/t feeling lightheaded. Pt states she has been feeling lightheaded every time she's OOB since admission. Pt had slight LOB. Suggested pt take a 5 min seated rest break for safety when pt ambulated to room door. Pt states lightheadedness is steady t/o ambulation. Pt able to ambulate safely back to bed w/ CGA. No LOB noted. Overall, pt tolerated session well. Pt is pleasant and motivated t/o session. Pt left laying in bed w/ alarm set. Call bell and phone placed in reach.     Lyall Faciane Mobility Specialist  04/02/20, 12:04 PM

## 2020-04-02 NOTE — Progress Notes (Signed)
PHARMACY CONSULT NOTE  Pharmacy Consult for Electrolyte Monitoring and Replacement   Recent Labs: Potassium (mmol/L)  Date Value  04/02/2020 3.7  04/19/2014 4.2   Magnesium (mg/dL)  Date Value  04/02/2020 2.2   Calcium (mg/dL)  Date Value  04/02/2020 8.7 (L)   Calcium, Total (mg/dL)  Date Value  04/19/2014 9.3   Albumin (g/dL)  Date Value  04/01/2020 2.8 (L)  04/19/2014 3.8   Phosphorus (mg/dL)  Date Value  04/02/2020 2.1 (L)   Sodium (mmol/L)  Date Value  04/02/2020 140  04/19/2014 129 (L)   Corrected Ca: 9.66 mg/dL  Assessment: Patient is a 54 y/o F with medical history including stage III vulvar intraepithelial neoplasia, pancreatitis, ischemic cardiomyopathy, diabetes, CHF admitted with diabetic ketoacidosis. Pharmacy has been consulted to assist with electrolyte monitor and replacement.    Goal of Therapy:  Electrolytes WNL   Plan:   K-phos neutral 500 mg x 2  recheck electrolytes with AM labs.   Vallery Sa, PharmD, BCPS 04/02/2020 7:05 AM

## 2020-04-02 NOTE — Progress Notes (Signed)
Hypoglycemic Event  CBG: 64  Treatment: 15 gram carbohydrate  Symptoms: None Follow-up CBG: Time:2140  CBG Result:97  Possible Reasons for Event: Inadequate PO intake Comments/MD notified:M. [place]

## 2020-04-02 NOTE — Plan of Care (Signed)
Patient not eating well.  Received glargine this am and has had low blood sugars throughout shift.  Patient able to drink more than eat. Will continue to montior.

## 2020-04-02 NOTE — Progress Notes (Signed)
PROGRESS NOTE    Renee Mack   GXQ:[id number]  DOB: August 29, 1965  PCP: Olin Hauser, DO    DOA: 03/30/2020 LOS: 3    Subjective 04/02/20    The patient was seen and examined this morning, stable no acute distress. Complaining of generalized weaknesses, dizziness. Denies any chest pain, improved shortness of breath     Brief Narrative   54 yo female with a complex past medical history of several comorbidities including Stage III Vulvar Intraepithelial Neoplasia, Urinary Incontinence, Tobacco Abuse, Spinal Stenosis, Shingles, Pancreatitis, MI, Ischemic Cardiomyopathy (Echo 2019: EF 30-35%), HTN, Degenerative Disc Disease, GERD, CAD, COPD, Chronic Combined Systolic and Diastolic CHF, Cervical Disc Disease, chronic opioid use.   Admitted to PCCM service on 10/10 with DKA after presenting with nausea/vomiting and generalized pain.  She was treated with insulin infusion, and has since been transitioned off drip to subcutaneous insulin.    Is being treated with IV Cefepime for bibasilar pneumonia on imaging, elevated procal and pt reports cough and generally ill at home prior to admission.  Hospitalist service assumed care of patient on 10/12.       Assessment & Plan   Active Problems:   Diabetic ketoacidosis (HCC)   DKA (diabetic ketoacidosis) (HCC)  Community-acquired Pneumonia - POA, pt with cough and ill-feeling at home, bibasilar ground glass opacities on imaging, elevated procal.  Negative for Covid-19. --Continue Cefepime for today anticipating to switch to oral as culture remain negative   Diabetic Ketoacidosis POA -Resolved  with blood glucose >864, pH 6.97, anion gap 27.  -Status post aggressive IV fluid treatment, insulin drip -Titrating subcu insulin Tolerating p.o.  Insulin-dependent Type 2 Diabetes with Insulin resistance - very poorly controlled with A1c 14.5%. Home insulin regimen is Lantus 100 units BID, Novolog 35 TID AC/HS. -Last blood  sugars 172, 71, 97 --After extensive discussion with pharmacy and diabetic coordinator we believe the best regimen for her will be NovoLog 70/30 42 units subcu twice daily --Currently on Lantus 35 units BID, Novolog 10 units TID WC >>> to be discontinued today --Reduce sliding coverage to sensitive, added HS sliding as well --NS @ 75 cc/hr until PO intake improves >>> will be discontinued   Anion gap metabolic acidosis / Lactic acidosis  -Resolved - due to dehydration in setting of DKA, resolved. PH on admission was 6.97 with gap of 27, lactic acid initially 2.6.  Hyponatremia - POA, resolved due to DKA  Hypokalemia / Hypophosphatemia --Monitoring electrolytes, improved --K 3.1, giving oral 40 mEqx2 on 04/01/2020 --phos being replaced  Acute kidney injury - POA,  -Resolved  Due to pre-renal azotemia in setting of dehydration with DKA.   Vulvar intraepithelial neoplasia III / Vulvar cancer - followed by palliative care at home.  Oncology at [facility]. Chonic pain on opioids - home regimen was verified and resumed.    Ischemic cardiomyopathy / Chronic combined systolic diastolic CHF - appears euvolemic, compensated. EF was 45-50% on left heart cath in Dec 2019. -Status post IV fluid resuscitation -  discontinued --strict I/O's    Coronary artery disease -  Denies any chest pain or shortness of breath --continued on ASA, Lipitor  Hypotension -resolved History of Hypertension  Leukocytosis - POA, improving, monitoring . Due to pneumonia and likely reactive in setting of acute illness. Continue antibiotics  Hx of pancreatitis / elevate lipase - CT abdomen/pelvis did not show signs of pancreatitis, pt without abdominal complaints.  Pulmonary nodule - new 6 mm lingular nodule seen on CT.  Recommendation for follow up CT in 6-12 months    DVT prophylaxis: heparin injection 5,000 Units Start: 03/30/20 0600 SCDs Start: 03/30/20 0429   Diet:  Diet Orders (From admission, onward)     Start     Ordered   03/30/20 2251  Diet Carb Modified Fluid consistency: Thin; Room service appropriate? Yes  Diet effective now       Question Answer Comment  Diet-HS Snack? Nothing   Calorie Level Medium 1600-2000   Fluid consistency: Thin   Room service appropriate? Yes      03/30/20 2250            Code Status: Full Code     Disposition Plan & Communication   Status is: Inpatient  Remains inpatient appropriate because:IV treatments appropriate due to intensity of illness or inability to take PO.  On IV antibiotics for bilateral PNA, and has poor PO intake.  D/C home once adequate oral intake, ambulating safely, stable electrolytes and transitioned to PO antibiotics,   Dispo: The patient is from: Home              Anticipated d/c is to: Home              Anticipated d/c date is: 1 day              Patient currently is not medically stable to d/c.   Family Communication: none at bedside, will attempt to call this afternoon    Consults, Procedures, Significant Events   Consultants:   None  Procedures:   None  Antimicrobials:  Anti-infectives (From admission, onward)   Start     Dose/Rate Route Frequency Ordered Stop   03/31/20 2200  ceFEPIme (MAXIPIME) 2 g in sodium chloride 0.9 % 100 mL IVPB        2 g 200 mL/hr over 30 Minutes Intravenous Every 12 hours 03/31/20 1032     03/30/20 0800  ceFEPIme (MAXIPIME) 2 g in sodium chloride 0.9 % 100 mL IVPB  Status:  Discontinued        2 g 200 mL/hr over 30 Minutes Intravenous Every 24 hours 03/30/20 0718 03/31/20 1032        Objective   Vitals:   04/02/20 0500 04/02/20 0502 04/02/20 0849 04/02/20 1142  BP:  109/62 (!) 119/59 (!) 111/55  Pulse:  73 78 81  Resp:  18 20 16   Temp:  98.6 F (37 C) 97.9 F (36.6 C) 98.2 F (36.8 C)  TempSrc:   Oral Oral  SpO2:  100% 98% 100%  Weight: 63.8 kg     Height:        Intake/Output Summary (Last 24 hours) at 04/02/2020 1249 Last data filed at 04/02/2020  1005 Gross per 24 hour  Intake 622.49 ml  Output 1000 ml  Net -377.51 ml   Filed Weights   03/30/20 0255 04/02/20 0500  Weight: 56.7 kg 63.8 kg      Physical Exam:   General:  Alert, oriented, cooperative, no distress;   HEENT:  Normocephalic, PERRL, otherwise with in Normal limits   Neuro:  CNII-XII intact. , normal motor and sensation, reflexes intact   Lungs:   Clear to auscultation BL, Respirations unlabored, no wheezes / crackles  Cardio:    S1/S2, RRR, No murmure, No Rubs or Gallops   Abdomen:   Soft, non-tender, bowel sounds active all four quadrants,  no guarding or peritoneal signs.  Muscular skeletal:  Limited exam - in bed, able to move  all 4 extremities, Normal strength,  2+ pulses,  symmetric, No pitting edema  Skin:  Dry, warm to touch, negative for any Rashes, No open wounds  Wounds: Please see nursing documentation          Labs   Data Reviewed: I have personally reviewed following labs and imaging studies  CBC: Recent Labs  Lab 03/30/20 0304 03/31/20 0223 04/01/20 0606  WBC 22.6* 20.8* 11.0*  NEUTROABS 14.6* 18.5*  --   HGB 17.6* 15.2* 14.1  HCT 55.1* 42.8 40.1  MCV 99.5 89.9 91.3  PLT 353 214 376   Basic Metabolic Panel: Recent Labs  Lab 03/30/20 0304 03/30/20 0425 03/30/20 0500 03/30/20 1640 03/30/20 2021 03/31/20 0223 03/31/20 0929 04/01/20 0606 04/02/20 0448  NA 124*   < >  --    < > 133* 134* 133* 138 140  K 5.0   < >  --    < > 3.4* 4.5 4.4 3.1* 3.7  CL 89*   < >  --    < > 104 108 107 112* 113*  CO2 8*   < >  --    < > 18* 13* 11* 20* 21*  GLUCOSE 864*   < >  --    < > 215* 284* 367* 139* 94  BUN 47*   < >  --    < > 59* 48* 40* 34* 16  CREATININE 2.20*   < >  --    < > 1.11* 1.12* 1.07* 0.63 0.48  CALCIUM 9.0   < >  --    < > 9.4 8.8* 8.7* 8.7* 8.7*  MG 2.4  --  2.3  --   --  2.1  --  2.5* 2.2  PHOS  --   --  5.8*  --   --  1.7*  --  2.0* 2.1*   < > = values in this interval not displayed.   GFR: Estimated Creatinine  Clearance: 67.2 mL/min (by C-G formula based on SCr of 0.48 mg/dL). Liver Function Tests: Recent Labs  Lab 03/30/20 0304 04/01/20 0606  AST 16 8*  ALT 7 <5  ALKPHOS 94 57  BILITOT 3.5* 0.6  PROT 8.8* 6.3*  ALBUMIN 4.0 2.8*   Recent Labs  Lab 03/30/20 0304  LIPASE 631*   No results for input(s): AMMONIA in the last 168 hours. Coagulation Profile: Recent Labs  Lab 03/30/20 0304  INR 1.2   Cardiac Enzymes: No results for input(s): CKTOTAL, CKMB, CKMBINDEX, TROPONINI in the last 168 hours. BNP (last 3 results) No results for input(s): PROBNP in the last 8760 hours. HbA1C: Recent Labs    03/31/20 0223  HGBA1C 14.5*   CBG: Recent Labs  Lab 04/01/20 1644 04/01/20 1740 04/01/20 2057 04/02/20 0734 04/02/20 1144  GLUCAP 28* 214* 172* 71 97   Lipid Profile: No results for input(s): CHOL, HDL, LDLCALC, TRIG, CHOLHDL, LDLDIRECT in the last 72 hours. Thyroid Function Tests: No results for input(s): TSH, T4TOTAL, FREET4, T3FREE, THYROIDAB in the last 72 hours. Anemia Panel: No results for input(s): VITAMINB12, FOLATE, FERRITIN, TIBC, IRON, RETICCTPCT in the last 72 hours. Sepsis Labs: Recent Labs  Lab 03/30/20 0304 03/30/20 0425 03/30/20 1643 04/01/20 0606 04/02/20 0448  PROCALCITON  --  0.38  --  0.41 <0.10  LATICACIDVEN 2.6*  --  1.5  --   --     Recent Results (from the past 240 hour(s))  Culture, blood (Routine X 2) w Reflex to ID Panel  Status: None (Preliminary result)   Collection Time: 03/30/20  2:23 AM   Specimen: BLOOD  Result Value Ref Range Status   Specimen Description BLOOD BLOOD RIGHT FOREARM  Final   Special Requests   Final    BOTTLES DRAWN AEROBIC AND ANAEROBIC Blood Culture results may not be optimal due to an inadequate volume of blood received in culture bottles   Culture   Final    NO GROWTH 2 DAYS Performed at [facility], [address], [place], [place] [address]    Report Status PENDING  Incomplete  Urine culture      Status: None   Collection Time: 03/30/20  3:04 AM   Specimen: Urine, Random  Result Value Ref Range Status   Specimen Description   Final    URINE, RANDOM Performed at [facility], [address] [place], [place] [address]    Special Requests   Final    NONE Performed at [facility], [address] [place], [place] [address]    Culture   Final    NO GROWTH Performed at [facility], [place] [address] [place], [place] [address]    Report Status 03/31/2020 FINAL  Final  Blood Culture (routine x 2)     Status: None (Preliminary result)   Collection Time: 03/30/20  3:05 AM   Specimen: BLOOD  Result Value Ref Range Status   Specimen Description BLOOD BLOOD LEFT FOREARM  Final   Special Requests   Final    BOTTLES DRAWN AEROBIC AND ANAEROBIC Blood Culture results may not be optimal due to an inadequate volume of blood received in culture bottles   Culture   Final    NO GROWTH 3 DAYS Performed at [facility], [address] [place], [place] [address]    Report Status PENDING  Incomplete  Respiratory Panel by RT PCR (Flu A&B, Covid) - Nasopharyngeal Swab     Status: None   Collection Time: 03/30/20  3:05 AM   Specimen: Nasopharyngeal Swab  Result Value Ref Range Status   SARS Coronavirus 2 by RT PCR NEGATIVE NEGATIVE Final    Comment: (NOTE) SARS-CoV-2 target nucleic acids are NOT DETECTED.  The SARS-CoV-2 RNA is generally detectable in upper respiratoy specimens during the acute phase of infection. The lowest concentration of SARS-CoV-2 viral copies this assay can detect is 131 copies/mL. A negative result does not preclude SARS-Cov-2 infection and should not be used as the sole basis for treatment or other patient management decisions. A negative result may occur with  improper specimen collection/handling, submission of specimen other than nasopharyngeal swab, presence of viral mutation(s) within the areas targeted by this assay, and  inadequate number of viral copies (<131 copies/mL). A negative result must be combined with clinical observations, patient history, and epidemiological information. The expected result is Negative.  Fact Sheet for Patients:  PinkCheek.be  Fact Sheet for Healthcare Providers:  GravelBags.it  This test is no t yet approved or cleared by the [place] FDA and  has been authorized for detection and/or diagnosis of SARS-CoV-2 by FDA under an Emergency Use Authorization (EUA). This EUA will remain  in effect (meaning this test can be used) for the duration of the COVID-19 declaration under Section 564(b)(1) of the Act, 21 U.S.C. section 360bbb-3(b)(1), unless the authorization is terminated or revoked sooner.     Influenza A by PCR NEGATIVE NEGATIVE Final   Influenza B by PCR NEGATIVE NEGATIVE Final    Comment: (NOTE)  The Xpert Xpress SARS-CoV-2/FLU/RSV assay is intended as an aid in  the diagnosis of influenza from Nasopharyngeal swab specimens and  should not be used as a sole basis for treatment. Nasal washings and  aspirates are unacceptable for Xpert Xpress SARS-CoV-2/FLU/RSV  testing.  Fact Sheet for Patients: PinkCheek.be  Fact Sheet for Healthcare Providers: GravelBags.it  This test is not yet approved or cleared by the [place] FDA and  has been authorized for detection and/or diagnosis of SARS-CoV-2 by  FDA under an Emergency Use Authorization (EUA). This EUA will remain  in effect (meaning this test can be used) for the duration of the  Covid-19 declaration under Section 564(b)(1) of the Act, 21  U.S.C. section 360bbb-3(b)(1), unless the authorization is  terminated or revoked. Performed at [facility], [address], [place], [place] [address]       Imaging Studies   No results found.   Medications   Scheduled Meds: .  amitriptyline  75 mg Oral QHS  . aspirin EC  81 mg Oral Daily  . atorvastatin  80 mg Oral q1800  . citalopram  40 mg Oral Daily  . docusate sodium  100 mg Oral Daily  . famotidine  20 mg Oral Daily  . heparin  5,000 Units Subcutaneous Q8H  . [START ON 04/03/2020] influenza vac split quadrivalent PF  0.5 mL Intramuscular Tomorrow-1000  . insulin aspart  0-5 Units Subcutaneous QHS  . insulin aspart  0-9 Units Subcutaneous TID WC  . insulin aspart protamine- aspart  42 Units Subcutaneous BID WC  . mirtazapine  15 mg Oral QHS  . oxyCODONE  60 mg Oral TID  . pantoprazole  40 mg Oral Daily  . phosphorus  500 mg Oral BID  . ranolazine  1,000 mg Oral BID  . senna  1 tablet Oral Daily  . sucralfate  1 g Oral QID   Continuous Infusions: . sodium chloride 75 mL/hr at 03/30/20 2211  . ceFEPime (MAXIPIME) IV 2 g (04/02/20 1017)       LOS: 3 days    Time spent: 30 minutes    Anahli Arvanitis A Styles Fambro, DO [facility]  04/02/2020, 12:49 PM    If 7PM-7AM, please contact night-coverage. How to contact the [facility] Attending or Consulting provider [place] or covering provider during after hours [place], for this patient?    1. Check the care team in [facility] and look for a) attending/consulting [facility] provider listed and b) the [facility] team listed 2. Log into [email or web address] and use [place]'s universal password to access. If you do not have the password, please contact the hospital operator. 3. Locate the [facility] provider you are looking for under [facility] and page to a number that you can be directly reached. 4. If you still have difficulty reaching the provider, please page the [facility] (Director on Call) for the Hospitalists listed on amion for assistance.

## 2020-04-03 DIAGNOSIS — R112 Nausea with vomiting, unspecified: Secondary | ICD-10-CM | POA: Diagnosis not present

## 2020-04-03 DIAGNOSIS — N179 Acute kidney failure, unspecified: Secondary | ICD-10-CM | POA: Diagnosis not present

## 2020-04-03 DIAGNOSIS — G893 Neoplasm related pain (acute) (chronic): Secondary | ICD-10-CM | POA: Diagnosis not present

## 2020-04-03 DIAGNOSIS — C519 Malignant neoplasm of vulva, unspecified: Secondary | ICD-10-CM | POA: Diagnosis not present

## 2020-04-03 LAB — BASIC METABOLIC PANEL
Anion gap: 7 (ref 5–15)
BUN: 8 mg/dL (ref 6–20)
CO2: 25 mmol/L (ref 22–32)
Calcium: 8.2 mg/dL — ABNORMAL LOW (ref 8.9–10.3)
Chloride: 108 mmol/L (ref 98–111)
Creatinine, Ser: 0.4 mg/dL — ABNORMAL LOW (ref 0.44–1.00)
GFR, Estimated: >60 mL/min (ref >60)
Glucose, Bld: 99 mg/dL (ref 70–99)
Potassium: 2.5 mmol/L — CL (ref 3.5–5.1)
Sodium: 140 mmol/L (ref 135–145)

## 2020-04-03 LAB — POTASSIUM: Potassium: 3.5 mmol/L (ref 3.5–5.1)

## 2020-04-03 LAB — CBC
HCT: 33.8 % — ABNORMAL LOW (ref 36.0–46.0)
Hemoglobin: 11.7 g/dL — ABNORMAL LOW (ref 12.0–15.0)
MCH: 32 pg (ref 26.0–34.0)
MCHC: 34.6 g/dL (ref 30.0–36.0)
MCV: 92.3 fL (ref 80.0–100.0)
Platelets: 148 10*3/uL — ABNORMAL LOW (ref 150–400)
RBC: 3.66 MIL/uL — ABNORMAL LOW (ref 3.87–5.11)
RDW: 13.6 % (ref 11.5–15.5)
WBC: 8.8 10*3/uL (ref 4.0–10.5)
nRBC: 0 % (ref 0.0–0.2)

## 2020-04-03 LAB — PHOSPHORUS: Phosphorus: 3.3 mg/dL (ref 2.5–4.6)

## 2020-04-03 LAB — GLUCOSE, CAPILLARY
Glucose-Capillary: 27 mg/dL — CL (ref 70–99)
Glucose-Capillary: 98 mg/dL (ref 70–99)
Glucose-Capillary: 167 mg/dL — ABNORMAL HIGH (ref 70–99)
Glucose-Capillary: 99 mg/dL (ref 70–99)
Glucose-Capillary: 128 mg/dL — ABNORMAL HIGH (ref 70–99)

## 2020-04-03 LAB — MAGNESIUM: Magnesium: 2.1 mg/dL (ref 1.7–2.4)

## 2020-04-03 MED ORDER — DOCUSATE SODIUM 100 MG PO CAPS
200.0000 mg | ORAL_CAPSULE | Freq: Two times a day (BID) | ORAL | Status: DC
  Administered 2020-04-03 – 2020-04-05 (×4): 200 mg via ORAL
  Filled 2020-04-03 (×4): qty 2

## 2020-04-03 MED ORDER — SENNA 8.6 MG PO TABS
1.0000 | ORAL_TABLET | Freq: Two times a day (BID) | ORAL | Status: DC
  Administered 2020-04-03 – 2020-04-05 (×4): 8.6 mg via ORAL
  Filled 2020-04-03 (×4): qty 1

## 2020-04-03 MED ORDER — MAGNESIUM SULFATE 2 GM/50ML IV SOLN
2.0000 g | Freq: Once | INTRAVENOUS | Status: AC
  Administered 2020-04-03: 2 g via INTRAVENOUS
  Filled 2020-04-03: qty 50

## 2020-04-03 MED ORDER — POTASSIUM CHLORIDE 10 MEQ/100ML IV SOLN
10.0000 meq | INTRAVENOUS | Status: AC
  Administered 2020-04-03 (×3): 10 meq via INTRAVENOUS
  Filled 2020-04-03 (×3): qty 100

## 2020-04-03 MED ORDER — RANOLAZINE ER 500 MG PO TB12
500.0000 mg | ORAL_TABLET | Freq: Two times a day (BID) | ORAL | Status: DC
  Administered 2020-04-03 – 2020-04-05 (×5): 500 mg via ORAL
  Filled 2020-04-03 (×6): qty 1

## 2020-04-03 MED ORDER — SODIUM CHLORIDE 0.9 % IV SOLN
INTRAVENOUS | Status: DC | PRN
  Administered 2020-04-03: 500 mL via INTRAVENOUS

## 2020-04-03 MED ORDER — POTASSIUM CHLORIDE CRYS ER 20 MEQ PO TBCR
40.0000 meq | EXTENDED_RELEASE_TABLET | Freq: Two times a day (BID) | ORAL | Status: DC
  Administered 2020-04-03 (×2): 40 meq via ORAL
  Filled 2020-04-03 (×3): qty 2

## 2020-04-03 MED ORDER — AMITRIPTYLINE HCL 50 MG PO TABS
50.0000 mg | ORAL_TABLET | Freq: Every day | ORAL | Status: DC
  Administered 2020-04-03: 50 mg via ORAL
  Filled 2020-04-03 (×2): qty 1

## 2020-04-03 MED ORDER — SODIUM CHLORIDE 0.9 % IV BOLUS
500.0000 mL | Freq: Once | INTRAVENOUS | Status: AC
  Administered 2020-04-03: 500 mL via INTRAVENOUS

## 2020-04-03 MED ORDER — OXYCODONE HCL 5 MG PO TABS
5.0000 mg | ORAL_TABLET | ORAL | Status: DC | PRN
  Administered 2020-04-03 – 2020-04-05 (×5): 5 mg via ORAL
  Filled 2020-04-03 (×5): qty 1

## 2020-04-03 MED ORDER — SODIUM CHLORIDE 0.9 % IV SOLN
2.0000 g | Freq: Two times a day (BID) | INTRAVENOUS | Status: AC
  Administered 2020-04-03: 2 g via INTRAVENOUS
  Filled 2020-04-03: qty 2

## 2020-04-03 MED ORDER — INSULIN ASPART PROT & ASPART (70-30 MIX) 100 UNIT/ML ~~LOC~~ SUSP
15.0000 [IU] | Freq: Two times a day (BID) | SUBCUTANEOUS | Status: DC
  Administered 2020-04-04 (×2): 15 [IU] via SUBCUTANEOUS
  Filled 2020-04-03 (×2): qty 10

## 2020-04-03 NOTE — Progress Notes (Signed)
PROGRESS NOTE    Renee Mack   NID:[id number]  DOB: 12-06-1965  PCP: Olin Hauser, DO    DOA: 03/30/2020 LOS: 4    Subjective 04/02/20    The patient was seen and examined this morning.  Awake alert oriented no acute distress stating she is not feeling well.  Complaining of weakness... Denies any chest pain shortness of breath... Mild dizziness with ambulation  Patient was noted for low blood pressure, hypokalemia " Episode of hypoglycemia blood sugar as low as 53 this morning 167    Brief Narrative   54 yo female with a complex past medical history of several comorbidities including Stage III Vulvar Intraepithelial Neoplasia, Urinary Incontinence, Tobacco Abuse, Spinal Stenosis, Shingles, Pancreatitis, MI, Ischemic Cardiomyopathy (Echo 2019: EF 30-35%), HTN, Degenerative Disc Disease, GERD, CAD, COPD, Chronic Combined Systolic and Diastolic CHF, Cervical Disc Disease, chronic opioid use.   Admitted to PCCM service on 10/10 with DKA after presenting with nausea/vomiting and generalized pain.  She was treated with insulin infusion, and has since been transitioned off drip to subcutaneous insulin.    Is being treated with IV Cefepime for bibasilar pneumonia on imaging, elevated procal and pt reports cough and generally ill at home prior to admission.  Hospitalist service assumed care of patient on 10/12.       Assessment & Plan   Active Problems:   Diabetic ketoacidosis ([place])   DKA (diabetic ketoacidosis) (HCC)  Community-acquired Pneumonia - POA,  -Improving pt with cough and ill-feeling at home, bibasilar ground glass opacities on imaging, elevated procal.  Negative for Covid-19. -Continue Cefepime for today anticipating to switch to oral as culture remain negative   Diabetic Ketoacidosis POA -Resolved --mild hypoglycemia overnight  with blood glucose >864, pH 6.97, anion gap 27.  -Status post aggressive IV fluid treatment, insulin drip -Initiated NPH  yesterday 70/30 45 units >> twice daily Tolerating p.o.  Insulin-dependent Type 2 Diabetes with Insulin resistance - very poorly controlled with A1c 14.5%. Home insulin regimen is Lantus 100 units BID, Novolog 35 TID AC/HS. -Last blood sugars 172, 71, 97 --After extensive discussion with pharmacy and diabetic coordinator we believe the best regimen for her will be NovoLog 70/30  --Currently on Lantus 35 units BID, Novolog 10 units TID WC >>> to be discontinued on 04/02/2020 -Patient has been initiated on NovoLog 70/30 42 units twice daily will be reduced to 15 units twice daily --Reduce sliding coverage to sensitive, added HS sliding as well -Status post IV fluid resuscitation   Anion gap metabolic acidosis / Lactic acidosis  -Resolved - due to dehydration in setting of DKA, resolved. PH on admission was 6.97 with gap of 27, lactic acid initially 2.6.  Hyponatremia - POA, resolved due to DKA  Hypokalemia / Hypophosphatemia --Monitoring electrolytes, improved -Monitoring potassium today 2.5, as needed IV will be given along with magnesium   Acute kidney injury - POA,  -Resolved  Due to pre-renal azotemia in setting of dehydration with DKA.   Vulvar intraepithelial neoplasia III / Vulvar cancer - followed by palliative care at home.  Oncology at [facility]. Chonic pain on opioids - home regimen was verified and resumed.    Ischemic cardiomyopathy / Chronic combined systolic diastolic CHF - appears euvolemic, compensated. EF was 45-50% on left heart cath in Dec 2019. -Status post IV fluid resuscitation -  discontinued --strict I/O's    Coronary artery disease -  Denies any chest pain or shortness of breath --continued on ASA, Lipitor  Hypotension -hypotensive once  again this morning blood pressure as low as 89/48, 500 mL bolus normal saline will be given History of Hypertension  Leukocytosis - POA, improving, monitoring Due to pneumonia and likely reactive in setting of acute  illness. Continue antibiotics  Hx of pancreatitis / elevate lipase - CT abdomen/pelvis did not show signs of pancreatitis, pt without abdominal complaints.  Pulmonary nodule - new 6 mm lingular nodule seen on CT.  Recommendation for follow up CT in 6-12 months    DVT prophylaxis: heparin injection 5,000 Units Start: 03/30/20 0600 SCDs Start: 03/30/20 0429   Diet:  Diet Orders (From admission, onward)    Start     Ordered   03/30/20 2251  Diet Carb Modified Fluid consistency: Thin; Room service appropriate? Yes  Diet effective now       Question Answer Comment  Diet-HS Snack? Nothing   Calorie Level Medium 1600-2000   Fluid consistency: Thin   Room service appropriate? Yes      03/30/20 2250            Code Status: Full Code     Disposition Plan & Communication   Status is: Inpatient  Remains inpatient appropriate because:IV treatments appropriate due to intensity of illness or inability to take PO.  On IV antibiotics for bilateral PNA, and has poor PO intake.  D/C home once adequate oral intake, ambulating safely, stable electrolytes and transitioned to PO antibiotics,   Dispo: The patient is from: Home              Anticipated d/c is to: Home              Anticipated d/c date is: 1 day              Patient currently is not medically stable to d/c.   Family Communication: none at bedside, will attempt to call this afternoon    Consults, Procedures, Significant Events   Consultants:   None  Procedures:   None  Antimicrobials:  Anti-infectives (From admission, onward)   Start     Dose/Rate Route Frequency Ordered Stop   03/31/20 2200  ceFEPIme (MAXIPIME) 2 g in sodium chloride 0.9 % 100 mL IVPB        2 g 200 mL/hr over 30 Minutes Intravenous Every 12 hours 03/31/20 1032     03/30/20 0800  ceFEPIme (MAXIPIME) 2 g in sodium chloride 0.9 % 100 mL IVPB  Status:  Discontinued        2 g 200 mL/hr over 30 Minutes Intravenous Every 24 hours 03/30/20 0718  03/31/20 1032        Objective   Vitals:   04/03/20 0500 04/03/20 0814 04/03/20 1151 04/03/20 1153  BP:  (!) 98/53 (!) 88/55 (!) 89/48  Pulse:  81 81 78  Resp:   14   Temp:  98.3 F (36.8 C) 98.9 F (37.2 C)   TempSrc:  Oral Oral   SpO2:  99% 94%   Weight: 63.4 kg     Height:        Intake/Output Summary (Last 24 hours) at 04/03/2020 1212 Last data filed at 04/03/2020 0800 Gross per 24 hour  Intake 1463.29 ml  Output --  Net 1463.29 ml   Filed Weights   03/30/20 0255 04/02/20 0500 04/03/20 0500  Weight: 56.7 kg 63.8 kg 63.4 kg       Physical Exam:   General:  Alert, oriented, cooperative, stating she is not feeling well  HEENT:  Normocephalic, PERRL,  otherwise with in Normal limits   Neuro:  CNII-XII intact. , normal motor and sensation, reflexes intact   Lungs:   Clear to auscultation BL, Respirations unlabored, no wheezes / crackles  Cardio:    S1/S2, RRR, No murmure, No Rubs or Gallops   Abdomen:   Soft, non-tender, bowel sounds active all four quadrants,  no guarding or peritoneal signs.  Muscular skeletal:   Generalized weaknesses, Limited exam - in bed, able to move all 4 extremities, Normal strength,  2+ pulses,  symmetric, No pitting edema  Skin:  Dry, warm to touch, negative for any Rashes, No open wounds  Wounds: Please see nursing documentation              Labs   Data Reviewed: I have personally reviewed following labs and imaging studies  CBC: Recent Labs  Lab 03/30/20 0304 03/31/20 0223 04/01/20 0606 04/03/20 0317  WBC 22.6* 20.8* 11.0* 8.8  NEUTROABS 14.6* 18.5*  --   --   HGB 17.6* 15.2* 14.1 11.7*  HCT 55.1* 42.8 40.1 33.8*  MCV 99.5 89.9 91.3 92.3  PLT 353 214 170 732*   Basic Metabolic Panel: Recent Labs  Lab 03/30/20 0500 03/30/20 1640 03/31/20 0223 03/31/20 0929 04/01/20 0606 04/02/20 0448 04/03/20 0317  NA  --    < > 134* 133* 138 140 140  K  --    < > 4.5 4.4 3.1* 3.7 2.5*  CL  --    < > 108 107 112* 113* 108   CO2  --    < > 13* 11* 20* 21* 25  GLUCOSE  --    < > 284* 367* 139* 94 99  BUN  --    < > 48* 40* 34* 16 8  CREATININE  --    < > 1.12* 1.07* 0.63 0.48 0.40*  CALCIUM  --    < > 8.8* 8.7* 8.7* 8.7* 8.2*  MG 2.3  --  2.1  --  2.5* 2.2 2.1  PHOS 5.8*  --  1.7*  --  2.0* 2.1* 3.3   < > = values in this interval not displayed.   GFR: Estimated Creatinine Clearance: 66.9 mL/min (A) (by C-G formula based on SCr of 0.4 mg/dL (L)). Liver Function Tests: Recent Labs  Lab 03/30/20 0304 04/01/20 0606  AST 16 8*  ALT 7 <5  ALKPHOS 94 57  BILITOT 3.5* 0.6  PROT 8.8* 6.3*  ALBUMIN 4.0 2.8*   Recent Labs  Lab 03/30/20 0304  LIPASE 631*   No results for input(s): AMMONIA in the last 168 hours. Coagulation Profile: Recent Labs  Lab 03/30/20 0304  INR 1.2   Cardiac Enzymes: No results for input(s): CKTOTAL, CKMB, CKMBINDEX, TROPONINI in the last 168 hours. BNP (last 3 results) No results for input(s): PROBNP in the last 8760 hours. HbA1C: No results for input(s): HGBA1C in the last 72 hours. CBG: Recent Labs  Lab 04/02/20 2103 04/02/20 2122 04/02/20 2140 04/03/20 0802 04/03/20 1152  GLUCAP 53* 64* 97 98 167*   Lipid Profile: No results for input(s): CHOL, HDL, LDLCALC, TRIG, CHOLHDL, LDLDIRECT in the last 72 hours. Thyroid Function Tests: No results for input(s): TSH, T4TOTAL, FREET4, T3FREE, THYROIDAB in the last 72 hours. Anemia Panel: No results for input(s): VITAMINB12, FOLATE, FERRITIN, TIBC, IRON, RETICCTPCT in the last 72 hours. Sepsis Labs: Recent Labs  Lab 03/30/20 0304 03/30/20 0425 03/30/20 1643 04/01/20 0606 04/02/20 0448  PROCALCITON  --  0.38  --  0.41 <0.10  LATICACIDVEN 2.6*  --  1.5  --   --     Recent Results (from the past 240 hour(s))  Culture, blood (Routine X 2) w Reflex to ID Panel     Status: None (Preliminary result)   Collection Time: 03/30/20  2:23 AM   Specimen: BLOOD  Result Value Ref Range Status   Specimen Description BLOOD  BLOOD RIGHT FOREARM  Final   Special Requests   Final    BOTTLES DRAWN AEROBIC AND ANAEROBIC Blood Culture results may not be optimal due to an inadequate volume of blood received in culture bottles   Culture   Final    NO GROWTH 3 DAYS Performed at [facility] [address] [place], [place] [address]    Report Status PENDING  Incomplete  Urine culture     Status: None   Collection Time: 03/30/20  3:04 AM   Specimen: Urine, Random  Result Value Ref Range Status   Specimen Description   Final    URINE, RANDOM Performed at [facility], [address], [place], [place] [address]    Special Requests   Final    NONE Performed at [facility], [address] [place], [place] [address]    Culture   Final    NO GROWTH Performed at [facility], [place] [address] [place], [place] [address]    Report Status 03/31/2020 FINAL  Final  Blood Culture (routine x 2)     Status: None (Preliminary result)   Collection Time: 03/30/20  3:05 AM   Specimen: BLOOD  Result Value Ref Range Status   Specimen Description BLOOD BLOOD LEFT FOREARM  Final   Special Requests   Final    BOTTLES DRAWN AEROBIC AND ANAEROBIC Blood Culture results may not be optimal due to an inadequate volume of blood received in culture bottles   Culture   Final    NO GROWTH 4 DAYS Performed at [facility], [address] [place], [place] [address]    Report Status PENDING  Incomplete  Respiratory Panel by RT PCR (Flu A&B, Covid) - Nasopharyngeal Swab     Status: None   Collection Time: 03/30/20  3:05 AM   Specimen: Nasopharyngeal Swab  Result Value Ref Range Status   SARS Coronavirus 2 by RT PCR NEGATIVE NEGATIVE Final    Comment: (NOTE) SARS-CoV-2 target nucleic acids are NOT DETECTED.  The SARS-CoV-2 RNA is generally detectable in upper respiratoy specimens during the acute phase of infection. The lowest concentration of SARS-CoV-2 viral copies this assay can detect  is 131 copies/mL. A negative result does not preclude SARS-Cov-2 infection and should not be used as the sole basis for treatment or other patient management decisions. A negative result may occur with  improper specimen collection/handling, submission of specimen other than nasopharyngeal swab, presence of viral mutation(s) within the areas targeted by this assay, and inadequate number of viral copies (<131 copies/mL). A negative result must be combined with clinical observations, patient history, and epidemiological information. The expected result is Negative.  Fact Sheet for Patients:  PinkCheek.be  Fact Sheet for Healthcare Providers:  GravelBags.it  This test is no t yet approved or cleared by the [place] FDA and  has been authorized for detection and/or diagnosis of SARS-CoV-2 by FDA under an Emergency Use Authorization (EUA). This EUA will remain  in effect (meaning this test can be used) for the duration of the COVID-19 declaration under Section 564(b)(1) of the Act,  21 U.S.C. section 360bbb-3(b)(1), unless the authorization is terminated or revoked sooner.     Influenza A by PCR NEGATIVE NEGATIVE Final   Influenza B by PCR NEGATIVE NEGATIVE Final    Comment: (NOTE) The Xpert Xpress SARS-CoV-2/FLU/RSV assay is intended as an aid in  the diagnosis of influenza from Nasopharyngeal swab specimens and  should not be used as a sole basis for treatment. Nasal washings and  aspirates are unacceptable for Xpert Xpress SARS-CoV-2/FLU/RSV  testing.  Fact Sheet for Patients: PinkCheek.be  Fact Sheet for Healthcare Providers: GravelBags.it  This test is not yet approved or cleared by the [place] FDA and  has been authorized for detection and/or diagnosis of SARS-CoV-2 by  FDA under an Emergency Use Authorization (EUA). This EUA will remain  in effect  (meaning this test can be used) for the duration of the  Covid-19 declaration under Section 564(b)(1) of the Act, 21  U.S.C. section 360bbb-3(b)(1), unless the authorization is  terminated or revoked. Performed at [facility], [address] [place], [place] [address]       Imaging Studies   No results found.   Medications   Scheduled Meds: . amitriptyline  50 mg Oral QHS  . aspirin EC  81 mg Oral Daily  . atorvastatin  80 mg Oral q1800  . citalopram  40 mg Oral Daily  . docusate sodium  100 mg Oral Daily  . famotidine  20 mg Oral Daily  . heparin  5,000 Units Subcutaneous Q8H  . insulin aspart  0-5 Units Subcutaneous QHS  . insulin aspart  0-9 Units Subcutaneous TID WC  . insulin aspart protamine- aspart  15 Units Subcutaneous BID WC  . mirtazapine  15 mg Oral QHS  . oxyCODONE  60 mg Oral TID  . potassium chloride  40 mEq Oral BID  . ranolazine  500 mg Oral BID  . senna  1 tablet Oral Daily  . sucralfate  1 g Oral QID  . ticagrelor  90 mg Oral BID   Continuous Infusions: . ceFEPime (MAXIPIME) IV Stopped (04/03/20 0027)  . sodium chloride         LOS: 4 days    Time spent: 30 minutes    Aking Klabunde A Nichalos Brenton, DO [facility]  04/03/2020, 12:12 PM    If 7PM-7AM, please contact night-coverage. How to contact the [facility] Attending or Consulting provider [place] or covering provider during after hours [place], for this patient?    1. Check the care team in [facility] and look for a) attending/consulting TR[facility] provider listed and b) the [facility] team listed 2. Log into [email or web address] and use [place]'s universal password to access. If you do not have the password, please contact the hospital operator. 3. Locate the [facility] provider you are looking for under [facility] and page to a number that you can be directly reached. 4. If you still have difficulty reaching the provider, please page the [facility] (Director on Call) for the Hospitalists listed on amion for  assistance.

## 2020-04-03 NOTE — Progress Notes (Signed)
Inpatient Diabetes Program Recommendations  AACE/ADA: New Consensus Statement on Inpatient Glycemic Control (2015)  Target Ranges:  Prepandial:   less than 140 mg/dL      Peak postprandial:   less than 180 mg/dL (1-2 hours)      Critically ill patients:  140 - 180 mg/dL   Lab Results  Component Value Date   GLUCAP 98 04/03/2020   HGBA1C 14.5 (H) 03/31/2020    Review of Glycemic Control Results for DAYJAH, SELMAN (MRN [id number]) as of 04/03/2020 11:59  Ref. Range 04/02/2020 20:44 04/02/2020 21:03 04/02/2020 21:22 04/02/2020 21:40 04/03/2020 08:02  Glucose-Capillary Latest Ref Range: 70 - 99 mg/dL 52 (L) 53 (L) 64 (L) 97 98   Diabetes history: DM2 Outpatient Diabetes medications: Uncertain of total doses of Lantus & Novolog (? Lantus 100 units bid + Novolog 20-22 units 1-2 times daily pc meals) Current orders for Inpatient glycemic control: Novolog 70/30 42 units bid + Novolog sensitive correction tid + hs 0-5 units  Inpatient Diabetes Program Recommendations:   Patient continues to require much less insulin than what she states she was taking @ home. Patient only received Lantus 35 units yesterday @ 10:15 and had hypoglycemia. May  consider decrease in 70/30 insulin to 15 units bid (approximately 21 units basal + 9 units meal coverage) Will continue to follow patient while in the hospital.  Thank you, Nani Gasser. Laquia Rosano, RN, MSN, CDE  Diabetes Coordinator Inpatient Glycemic Control Team Team Pager [phone] (8am-5pm) 04/03/2020 12:09 PM

## 2020-04-03 NOTE — Progress Notes (Signed)
Lab called with pt's critical potassium of 2.5. FNP notified M. [place] notified. Pt in bed resting at this time.

## 2020-04-03 NOTE — Progress Notes (Addendum)
Mobility Specialist - Progress Note   04/03/20 1214  Mobility  Activity Contraindicated/medical hold  Mobility performed by Mobility specialist    Per chart review, pt's K currently at 2.5. K level sits outside of mobility's safety guidelines. Will hold and re-attempt session when medically appropriate.    Pawan Knechtel Mobility Specialist  04/03/20, 12:17 PM

## 2020-04-03 NOTE — Progress Notes (Signed)
[place] for Electrolyte Monitoring and Replacement   Recent Labs: Potassium (mmol/L)  Date Value  04/03/2020 2.5 (LL)  04/19/2014 4.2   Magnesium (mg/dL)  Date Value  04/03/2020 2.1   Calcium (mg/dL)  Date Value  04/03/2020 8.2 (L)   Calcium, Total (mg/dL)  Date Value  04/19/2014 9.3   Albumin (g/dL)  Date Value  04/01/2020 2.8 (L)  04/19/2014 3.8   Phosphorus (mg/dL)  Date Value  04/03/2020 3.3   Sodium (mmol/L)  Date Value  04/03/2020 140  04/19/2014 129 (L)   Corrected Ca: 9.66 mg/dL  Assessment: Patient is a 54 y/o F with medical history including stage III vulvar intraepithelial neoplasia, pancreatitis, ischemic cardiomyopathy, diabetes, CHF admitted with diabetic ketoacidosis. Pharmacy has been consulted to assist with electrolyte monitor and replacement.    Goal of Therapy:  Electrolytes WNL   Plan:   K 2.5. Mag 2.1  Phos 3.3  Scr 0.40   FNP has ordered KCL 10 meq IV x 3 doses and KCL PO 40 meq BID x 2 days  Will recheck K at 1800 and f/u electrolytes with AM labs.   Chinita Greenland PharmD Clinical Pharmacist 04/03/2020

## 2020-04-03 NOTE — Progress Notes (Signed)
Patient's BP has been running soft. Patient is asymptomatic. Per Dr. Roger Shelter give 526mL NS bolus and recheck.     04/03/20 1153  Vitals  BP (!) 89/48 (RN Tully Mcinturff notified)  MAP (mmHg) (!) 62  BP Method Automatic  Pulse Rate 78  MEWS COLOR  MEWS Score Color Green  MEWS Score  MEWS Temp 0  MEWS Systolic 1  MEWS Pulse 0  MEWS RR 0  MEWS LOC 0  MEWS Score 1    Garlen Reinig Candyce Churn, RN

## 2020-04-04 DIAGNOSIS — C519 Malignant neoplasm of vulva, unspecified: Secondary | ICD-10-CM | POA: Diagnosis not present

## 2020-04-04 DIAGNOSIS — E1110 Type 2 diabetes mellitus with ketoacidosis without coma: Secondary | ICD-10-CM | POA: Diagnosis not present

## 2020-04-04 DIAGNOSIS — G893 Neoplasm related pain (acute) (chronic): Secondary | ICD-10-CM | POA: Diagnosis not present

## 2020-04-04 DIAGNOSIS — N179 Acute kidney failure, unspecified: Secondary | ICD-10-CM | POA: Diagnosis not present

## 2020-04-04 LAB — GLUCOSE, CAPILLARY
Glucose-Capillary: 149 mg/dL — ABNORMAL HIGH (ref 70–99)
Glucose-Capillary: 114 mg/dL — ABNORMAL HIGH (ref 70–99)
Glucose-Capillary: 168 mg/dL — ABNORMAL HIGH (ref 70–99)
Glucose-Capillary: 162 mg/dL — ABNORMAL HIGH (ref 70–99)

## 2020-04-04 LAB — BASIC METABOLIC PANEL
Anion gap: 7 (ref 5–15)
BUN: 9 mg/dL (ref 6–20)
CO2: 28 mmol/L (ref 22–32)
Calcium: 8.5 mg/dL — ABNORMAL LOW (ref 8.9–10.3)
Chloride: 105 mmol/L (ref 98–111)
Creatinine, Ser: 0.57 mg/dL (ref 0.44–1.00)
GFR, Estimated: >60 mL/min (ref >60)
Glucose, Bld: 154 mg/dL — ABNORMAL HIGH (ref 70–99)
Potassium: 4.2 mmol/L (ref 3.5–5.1)
Sodium: 140 mmol/L (ref 135–145)

## 2020-04-04 LAB — CULTURE, BLOOD (ROUTINE X 2): Culture: NO GROWTH

## 2020-04-04 LAB — CORTISOL-AM, BLOOD: Cortisol - AM: 12.7 ug/dL (ref 6.7–22.6)

## 2020-04-04 LAB — CBC
HCT: 36.1 % (ref 36.0–46.0)
Hemoglobin: 12.3 g/dL (ref 12.0–15.0)
MCH: 31.7 pg (ref 26.0–34.0)
MCHC: 34.1 g/dL (ref 30.0–36.0)
MCV: 93 fL (ref 80.0–100.0)
Platelets: 160 10*3/uL (ref 150–400)
RBC: 3.88 MIL/uL (ref 3.87–5.11)
RDW: 14 % (ref 11.5–15.5)
WBC: 10.6 10*3/uL — ABNORMAL HIGH (ref 4.0–10.5)
nRBC: 0 % (ref 0.0–0.2)

## 2020-04-04 LAB — PHOSPHORUS: Phosphorus: 2.1 mg/dL — ABNORMAL LOW (ref 2.5–4.6)

## 2020-04-04 LAB — MAGNESIUM: Magnesium: 2.1 mg/dL (ref 1.7–2.4)

## 2020-04-04 MED ORDER — AMITRIPTYLINE HCL 25 MG PO TABS
25.0000 mg | ORAL_TABLET | Freq: Every day | ORAL | Status: DC
  Administered 2020-04-04: 25 mg via ORAL
  Filled 2020-04-04 (×2): qty 1

## 2020-04-04 MED ORDER — SODIUM CHLORIDE 0.9 % IV BOLUS
500.0000 mL | Freq: Once | INTRAVENOUS | Status: AC
  Administered 2020-04-04: 500 mL via INTRAVENOUS

## 2020-04-04 MED ORDER — LEVOFLOXACIN 750 MG PO TABS
750.0000 mg | ORAL_TABLET | Freq: Every day | ORAL | Status: DC
  Administered 2020-04-04 – 2020-04-05 (×2): 750 mg via ORAL
  Filled 2020-04-04 (×2): qty 1

## 2020-04-04 MED ORDER — K PHOS MONO-SOD PHOS DI & MONO 155-852-130 MG PO TABS
500.0000 mg | ORAL_TABLET | Freq: Three times a day (TID) | ORAL | Status: AC
  Administered 2020-04-04 (×2): 500 mg via ORAL
  Filled 2020-04-04 (×2): qty 2

## 2020-04-04 NOTE — Care Management Important Message (Signed)
Important Message  Patient Details  Name: Renee Mack MRN: [id number] Date of Birth: 11-12-1965   Medicare Important Message Given:  Yes     Dannette Barbara 04/04/2020, 11:23 AM

## 2020-04-04 NOTE — Progress Notes (Signed)
PROGRESS NOTE    Renee Mack   IRS:[id number]  DOB: January 20, 1966  PCP: Olin Hauser, DO    DOA: 03/30/2020 LOS: 5    Subjective 04/02/20    Patient was seen and examined still complaining of generalized aches and pain, generalized weakness, patient was noted to be hypotensive, blood sugars have improved no episodes of hypoglycemia this morning. No issues overnight     Brief Narrative   54 yo female with a complex past medical history of several comorbidities including Stage III Vulvar Intraepithelial Neoplasia, Urinary Incontinence, Tobacco Abuse, Spinal Stenosis, Shingles, Pancreatitis, MI, Ischemic Cardiomyopathy (Echo 2019: EF 30-35%), HTN, Degenerative Disc Disease, GERD, CAD, COPD, Chronic Combined Systolic and Diastolic CHF, Cervical Disc Disease, chronic opioid use.   Admitted to PCCM service on 10/10 with DKA after presenting with nausea/vomiting and generalized pain.  She was treated with insulin infusion, and has since been transitioned off drip to subcutaneous insulin.    Is being treated with IV Cefepime for bibasilar pneumonia on imaging, elevated procal and pt reports cough and generally ill at home prior to admission.  Hospitalist service assumed care of patient on 10/12.       Assessment & Plan   Active Problems:   Diabetic ketoacidosis (HCC)   DKA (diabetic ketoacidosis) (HCC)  Community-acquired Pneumonia - POA,  -Much improved, switching her IV antibiotics to p.o. Levaquin -Improved shortness of breath cough - bibasilar ground glass opacities on imaging, elevated procal.  Negative for Covid-19. -Continue Cefepime for today anticipating to switch to oral as culture remain negative   Diabetic Ketoacidosis POA -Resolved  with blood glucose >864, pH 6.97, anion gap 27.  -Status post aggressive IV fluid treatment, insulin drip -Initiated NPH yesterday 70/30 45 units >> twice daily Tolerating p.o.  Insulin-dependent Type 2 Diabetes with  Insulin resistance - very poorly controlled with A1c 14.5%. Home insulin regimen is Lantus 100 units BID, Novolog 35 TID AC/HS. >>> has been switched to NPH twice daily -Last blood sugars: 99, 128, 149 this morning  --Titrating NovoLog 70/30  >> 15 units twice daily -Checking blood sugar QA CHS, SSI coverage    Anion gap metabolic acidosis / Lactic acidosis  -Due to the KA,-resolved. PH on admission was 6.97 with gap of 27, lactic acid initially 2.6.  Hyponatremia - POA, resolved due to DKA  Hypokalemia / Hypophosphatemia --Monitoring electrolytes, improved -Repleted with magnesium  Acute kidney injury - POA,  -Resolved  Due to pre-renal azotemia in setting of dehydration with DKA.   Vulvar intraepithelial neoplasia III / Vulvar cancer - followed by palliative care at home.  Oncology at [facility]. Chonic pain on opioids - home regimen was verified and resumed.    Ischemic cardiomyopathy / Chronic combined systolic diastolic CHF - appears euvolemic, compensated. EF was 45-50% on left heart cath in Dec 2019. -Status post IV fluid resuscitation -  discontinued --strict I/O's    Coronary artery disease -  Denies any chest pain or shortness of breath --continued on ASA, Lipitor  Hypotension  -Remained hypotensive, her tricyclic amitriptyline has been reduced in dose, TED hose has been applied, -Another bolus of normal saline 500 mL will be given  History of Hypertension -currently hypotensive not on any hypertensive medications  Leukocytosis - POA, improving, monitoring Due to pneumonia and likely reactive in setting of acute illness. Continue antibiotics  Hx of pancreatitis / elevate lipase - CT abdomen/pelvis did not show signs of pancreatitis, pt without abdominal complaints.  Pulmonary nodule - new 6  mm lingular nodule seen on CT.  Recommendation for follow up CT in 6-12 months    DVT prophylaxis: Place TED hose Start: 04/04/20 1137 heparin injection 5,000 Units Start:  03/30/20 0600 SCDs Start: 03/30/20 0429   Diet:  Diet Orders (From admission, onward)    Start     Ordered   03/30/20 2251  Diet Carb Modified Fluid consistency: Thin; Room service appropriate? Yes  Diet effective now       Question Answer Comment  Diet-HS Snack? Nothing   Calorie Level Medium 1600-2000   Fluid consistency: Thin   Room service appropriate? Yes      03/30/20 2250            Code Status: Full Code     Disposition Plan & Communication   Status is: Inpatient  Remains inpatient appropriate because:IV treatments appropriate due to intensity of illness or inability to take PO.  On IV antibiotics for bilateral PNA, and has poor PO intake.  D/C home once adequate oral intake, ambulating safely, stable electrolytes and transitioned to PO antibiotics,   Dispo: The patient is from: Home              Anticipated d/c is to: Home              Anticipated d/c date is: 1 day-likely will be discharged in a.m. if improved hemodynamically, improved blood  pressure, better glycemic control on new regimen of NPH              Patient currently is not medically stable to d/c.   Family Communication: none at bedside, will attempt to call this afternoon    Consults, Procedures, Significant Events   Consultants:   None  Procedures:   None  Antimicrobials:  Anti-infectives (From admission, onward)   Start     Dose/Rate Route Frequency Ordered Stop   04/04/20 1230  levofloxacin (LEVAQUIN) tablet 750 mg        750 mg Oral Daily 04/04/20 1133 04/09/20 0959   04/03/20 1500  ceFEPIme (MAXIPIME) 2 g in sodium chloride 0.9 % 100 mL IVPB        2 g 200 mL/hr over 30 Minutes Intravenous Every 12 hours 04/03/20 1415 04/03/20 1802   03/31/20 2200  ceFEPIme (MAXIPIME) 2 g in sodium chloride 0.9 % 100 mL IVPB  Status:  Discontinued        2 g 200 mL/hr over 30 Minutes Intravenous Every 12 hours 03/31/20 1032 04/03/20 1415   03/30/20 0800  ceFEPIme (MAXIPIME) 2 g in sodium chloride  0.9 % 100 mL IVPB  Status:  Discontinued        2 g 200 mL/hr over 30 Minutes Intravenous Every 24 hours 03/30/20 0718 03/31/20 1032        Objective   Vitals:   04/03/20 2317 04/04/20 0624 04/04/20 0753 04/04/20 1111  BP: 116/65 104/64 (!) 94/53 (!) 89/59  Pulse: 94 86 84 79  Resp: 14 20 14 16   Temp: 98 F (36.7 C) 98.5 F (36.9 C) 99.4 F (37.4 C) 98.4 F (36.9 C)  TempSrc: Oral Oral Oral Oral  SpO2: 94% 92% 92% 94%  Weight:  61.7 kg    Height:        Intake/Output Summary (Last 24 hours) at 04/04/2020 1136 Last data filed at 04/04/2020 0320 Gross per 24 hour  Intake 1325.33 ml  Output 1400 ml  Net -74.67 ml   Filed Weights   04/02/20 0500 04/03/20 0500 04/04/20  0624  Weight: 63.8 kg 63.4 kg 61.7 kg       Physical Exam:   General:  Alert, oriented, cooperative, no distress;   HEENT:  Normocephalic, PERRL, otherwise with in Normal limits   Neuro:  CNII-XII intact. , normal motor and sensation, reflexes intact   Lungs:   Clear to auscultation BL, Respirations unlabored, no wheezes / crackles  Cardio:    S1/S2, RRR, No murmure, No Rubs or Gallops   Abdomen:   Soft, non-tender, bowel sounds active all four quadrants,  no guarding or peritoneal signs.  Muscular skeletal:  Limited exam - in bed, able to move all 4 extremities, Normal strength,  2+ pulses,  symmetric, No pitting edema  Skin:  Dry, warm to touch, negative for any Rashes, No open wounds  Wounds: Please see nursing documentation                 Labs   Data Reviewed: I have personally reviewed following labs and imaging studies  CBC: Recent Labs  Lab 03/30/20 0304 03/31/20 0223 04/01/20 0606 04/03/20 0317 04/04/20 0519  WBC 22.6* 20.8* 11.0* 8.8 10.6*  NEUTROABS 14.6* 18.5*  --   --   --   HGB 17.6* 15.2* 14.1 11.7* 12.3  HCT 55.1* 42.8 40.1 33.8* 36.1  MCV 99.5 89.9 91.3 92.3 93.0  PLT 353 214 170 148* 161   Basic Metabolic Panel: Recent Labs  Lab 03/31/20 0223  03/31/20 0223 03/31/20 0929 03/31/20 0929 04/01/20 0606 04/02/20 0448 04/03/20 0317 04/03/20 1249 04/04/20 0519  NA 134*   < > 133*  --  138 140 140  --  140  K 4.5   < > 4.4   < > 3.1* 3.7 2.5* 3.5 4.2  CL 108   < > 107  --  112* 113* 108  --  105  CO2 13*   < > 11*  --  20* 21* 25  --  28  GLUCOSE 284*   < > 367*  --  139* 94 99  --  154*  BUN 48*   < > 40*  --  34* 16 8  --  9  CREATININE 1.12*   < > 1.07*  --  0.63 0.48 0.40*  --  0.57  CALCIUM 8.8*   < > 8.7*  --  8.7* 8.7* 8.2*  --  8.5*  MG 2.1  --   --   --  2.5* 2.2 2.1  --  2.1  PHOS 1.7*  --   --   --  2.0* 2.1* 3.3  --  2.1*   < > = values in this interval not displayed.   GFR: Estimated Creatinine Clearance: 66.2 mL/min (by C-G formula based on SCr of 0.57 mg/dL). Liver Function Tests: Recent Labs  Lab 03/30/20 0304 04/01/20 0606  AST 16 8*  ALT 7 <5  ALKPHOS 94 57  BILITOT 3.5* 0.6  PROT 8.8* 6.3*  ALBUMIN 4.0 2.8*   Recent Labs  Lab 03/30/20 0304  LIPASE 631*   No results for input(s): AMMONIA in the last 168 hours. Coagulation Profile: Recent Labs  Lab 03/30/20 0304  INR 1.2   Cardiac Enzymes: No results for input(s): CKTOTAL, CKMB, CKMBINDEX, TROPONINI in the last 168 hours. BNP (last 3 results) No results for input(s): PROBNP in the last 8760 hours. HbA1C: No results for input(s): HGBA1C in the last 72 hours. CBG: Recent Labs  Lab 04/03/20 0802 04/03/20 1152 04/03/20 1632 04/03/20 2146 04/04/20 0960  GLUCAP 98 167* 99 128* 149*   Lipid Profile: No results for input(s): CHOL, HDL, LDLCALC, TRIG, CHOLHDL, LDLDIRECT in the last 72 hours. Thyroid Function Tests: No results for input(s): TSH, T4TOTAL, FREET4, T3FREE, THYROIDAB in the last 72 hours. Anemia Panel: No results for input(s): VITAMINB12, FOLATE, FERRITIN, TIBC, IRON, RETICCTPCT in the last 72 hours. Sepsis Labs: Recent Labs  Lab 03/30/20 0304 03/30/20 0425 03/30/20 1643 04/01/20 0606 04/02/20 0448  PROCALCITON  --   0.38  --  0.41 <0.10  LATICACIDVEN 2.6*  --  1.5  --   --     Recent Results (from the past 240 hour(s))  Culture, blood (Routine X 2) w Reflex to ID Panel     Status: None (Preliminary result)   Collection Time: 03/30/20  2:23 AM   Specimen: BLOOD  Result Value Ref Range Status   Specimen Description BLOOD BLOOD RIGHT FOREARM  Final   Special Requests   Final    BOTTLES DRAWN AEROBIC AND ANAEROBIC Blood Culture results may not be optimal due to an inadequate volume of blood received in culture bottles   Culture   Final    NO GROWTH 4 DAYS Performed at [facility], [address] [place], [place] [address]    Report Status PENDING  Incomplete  Urine culture     Status: None   Collection Time: 03/30/20  3:04 AM   Specimen: Urine, Random  Result Value Ref Range Status   Specimen Description   Final    URINE, RANDOM Performed at [facility], [address] [place], [place] [address]    Special Requests   Final    NONE Performed at [facility], [address], [place], [place] [address]    Culture   Final    NO GROWTH Performed at [facility], [address] [place], [place] [address]    Report Status 03/31/2020 FINAL  Final  Blood Culture (routine x 2)     Status: None   Collection Time: 03/30/20  3:05 AM   Specimen: BLOOD  Result Value Ref Range Status   Specimen Description BLOOD BLOOD LEFT FOREARM  Final   Special Requests   Final    BOTTLES DRAWN AEROBIC AND ANAEROBIC Blood Culture results may not be optimal due to an inadequate volume of blood received in culture bottles   Culture   Final    NO GROWTH 5 DAYS Performed at [facility], [address] [place], [place] [address]    Report Status 04/04/2020 FINAL  Final  Respiratory Panel by RT PCR (Flu A&B, Covid) - Nasopharyngeal Swab     Status: None   Collection Time: 03/30/20  3:05 AM   Specimen: Nasopharyngeal Swab  Result Value Ref Range Status   SARS  Coronavirus 2 by RT PCR NEGATIVE NEGATIVE Final    Comment: (NOTE) SARS-CoV-2 target nucleic acids are NOT DETECTED.  The SARS-CoV-2 RNA is generally detectable in upper respiratoy specimens during the acute phase of infection. The lowest concentration of SARS-CoV-2 viral copies this assay can detect is 131 copies/mL. A negative result does not preclude SARS-Cov-2 infection and should not be used as the sole basis for treatment or other patient management decisions. A negative result may occur with  improper specimen collection/handling, submission of specimen other than nasopharyngeal swab, presence of viral mutation(s) within the areas targeted by this assay, and inadequate number of viral copies (<131 copies/mL). A negative result must be combined with clinical  observations, patient history, and epidemiological information. The expected result is Negative.  Fact Sheet for Patients:  PinkCheek.be  Fact Sheet for Healthcare Providers:  GravelBags.it  This test is no t yet approved or cleared by the [place] FDA and  has been authorized for detection and/or diagnosis of SARS-CoV-2 by FDA under an Emergency Use Authorization (EUA). This EUA will remain  in effect (meaning this test can be used) for the duration of the COVID-19 declaration under Section 564(b)(1) of the Act, 21 U.S.C. section 360bbb-3(b)(1), unless the authorization is terminated or revoked sooner.     Influenza A by PCR NEGATIVE NEGATIVE Final   Influenza B by PCR NEGATIVE NEGATIVE Final    Comment: (NOTE) The Xpert Xpress SARS-CoV-2/FLU/RSV assay is intended as an aid in  the diagnosis of influenza from Nasopharyngeal swab specimens and  should not be used as a sole basis for treatment. Nasal washings and  aspirates are unacceptable for Xpert Xpress SARS-CoV-2/FLU/RSV  testing.  Fact Sheet for  Patients: PinkCheek.be  Fact Sheet for Healthcare Providers: GravelBags.it  This test is not yet approved or cleared by the [place] FDA and  has been authorized for detection and/or diagnosis of SARS-CoV-2 by  FDA under an Emergency Use Authorization (EUA). This EUA will remain  in effect (meaning this test can be used) for the duration of the  Covid-19 declaration under Section 564(b)(1) of the Act, 21  U.S.C. section 360bbb-3(b)(1), unless the authorization is  terminated or revoked. Performed at [facility], [address] [place], [place] [address]       Imaging Studies   No results found.   Medications   Scheduled Meds: . amitriptyline  25 mg Oral QHS  . aspirin EC  81 mg Oral Daily  . atorvastatin  80 mg Oral q1800  . citalopram  40 mg Oral Daily  . docusate sodium  200 mg Oral BID  . famotidine  20 mg Oral Daily  . heparin  5,000 Units Subcutaneous Q8H  . insulin aspart  0-5 Units Subcutaneous QHS  . insulin aspart  0-9 Units Subcutaneous TID WC  . insulin aspart protamine- aspart  15 Units Subcutaneous BID WC  . levofloxacin  750 mg Oral Daily  . mirtazapine  15 mg Oral QHS  . oxyCODONE  60 mg Oral TID  . phosphorus  500 mg Oral TID  . ranolazine  500 mg Oral BID  . senna  1 tablet Oral BID  . sucralfate  1 g Oral QID  . ticagrelor  90 mg Oral BID   Continuous Infusions: . sodium chloride 10 mL/hr at 04/03/20 1802  . sodium chloride         LOS: 5 days    Time spent: 30 minutes    Deatra James, MD [facility]  04/04/2020, 11:36 AM    If 7PM-7AM, please contact night-coverage. How to contact the [facility] Attending or Consulting provider [place] or covering provider during after hours [place], for this patient?    1. Check the care team in [facility] and look for a) attending/consulting TR[facility] provider listed and b) the [facility] team listed 2. Log into [email or web address] and use  [place]'s universal password to access. If you do not have the password, please contact the hospital operator. 3. Locate the [facility] provider you are looking for under [facility] and page to a number that you can be directly reached. 4. If you still have difficulty reaching the provider, please  page the [facility] (Director on Call) for the Hospitalists listed on amion for assistance.

## 2020-04-04 NOTE — Progress Notes (Signed)
PHARMACY CONSULT NOTE  Pharmacy Consult for Electrolyte Monitoring and Replacement   Recent Labs: Potassium (mmol/L)  Date Value  04/04/2020 4.2  04/19/2014 4.2   Magnesium (mg/dL)  Date Value  04/04/2020 2.1   Calcium (mg/dL)  Date Value  04/04/2020 8.5 (L)   Calcium, Total (mg/dL)  Date Value  04/19/2014 9.3   Albumin (g/dL)  Date Value  04/01/2020 2.8 (L)  04/19/2014 3.8   Phosphorus (mg/dL)  Date Value  04/04/2020 2.1 (L)   Sodium (mmol/L)  Date Value  04/04/2020 140  04/19/2014 129 (L)   Corrected Ca: 9.46 mg/dL  Assessment: Patient is a 54 y/o F with medical history including stage III vulvar intraepithelial neoplasia, pancreatitis, ischemic cardiomyopathy, diabetes, CHF admitted with diabetic ketoacidosis. Pharmacy has been consulted to assist with electrolyte monitor and replacement.    Goal of Therapy:  Electrolytes WNL   Plan:   K-Phos neutral 500 mg po x 2  f/u electrolytes with AM labs.   Chinita Greenland PharmD Clinical Pharmacist 04/04/2020

## 2020-04-04 NOTE — Evaluation (Signed)
Physical Therapy Evaluation Patient Details Name: Renee Mack MRN: [id number] DOB: 10/24/1965 Today's Date: 04/04/2020   History of Present Illness  This is a 54 yo female with a PMH of Stage III Vulvar Intraepithelial Neoplasia, Urinary Incontinence, Tobacco Abuse, Spinal Stenosis, Shingles, Pancreatitis, MI, Ischemic Cardiomyopathy (Echo 2019: EF 30-35%), HTN, Degenerative Disc Disease, GERD, CAD, COPD, Chronic Combined Systolic and Diastolic CHF, and Cervical Disc Disease.  She presented to [facility] ER on 10/10 from home via EMS with nausea, vomiting, and generalized pain onset of symptoms 10/9.  Per ER notes pts neighbors called EMS when they heard her crying out and had not seen the pt since yesterday.  When EMS arrived at pts home they found her on the floor with c/o weakness and thirst due to inability to eat or drink fluids secondary to frequent vomiting.  Clinical Impression  Patient received in bed, agreeable to PT session. She reports dizziness with getting up. Has been up to the bedside commode with assistance. She is mod independent with bed mobility, transfers with min assist. Transferred to [facility] from bed with min guard, and then ambulated 20 feet with min guard holding to IV pole. Patient continues to report dizziness with mobility and is unsteady relying on B UE support for gait. She will continue to benefit from skilled PT while here to improve strength and functional independence for return home at discharge.     Follow Up Recommendations Home health PT    Equipment Recommendations  Rolling walker with 5" wheels    Recommendations for Other Services       Precautions / Restrictions Precautions Precautions: Fall Restrictions Weight Bearing Restrictions: No      Mobility  Bed Mobility Overal bed mobility: Modified Independent                Transfers Overall transfer level: Needs assistance Equipment used: 1 person hand held assist Transfers: Sit to/from  Stand Sit to Stand: Min assist         General transfer comment: unsteady and reports dizziness with sitting up  Ambulation/Gait Ambulation/Gait assistance: Min guard Gait Distance (Feet): 20 Feet Assistive device: IV Pole Gait Pattern/deviations: Step-through pattern;Decreased stride length;Trunk flexed Gait velocity: decreased   General Gait Details: unsteady with ambulation, therefore holding IV pole for steadying with min guard assist.  Stairs            Wheelchair Mobility    Modified Rankin (Stroke Patients Only)       Balance Overall balance assessment: Needs assistance Sitting-balance support: Feet supported Sitting balance-Leahy Scale: Good     Standing balance support: Bilateral upper extremity supported;During functional activity Standing balance-Leahy Scale: Fair Standing balance comment: unsteady, reliant on B UE support for gait at this time                             Pertinent Vitals/Pain Pain Assessment: Faces Faces Pain Scale: Hurts little more Pain Location: bottom with scooting in bed Pain Descriptors / Indicators: Discomfort Pain Intervention(s): Monitored during session;Repositioned    Home Living Family/patient expects to be discharged to:: Private residence Living Arrangements: Alone Available Help at Discharge: Neighbor;Available PRN/intermittently Type of Home: Apartment Home Access: Level entry     Home Layout: One level        Prior Function Level of Independence: Independent         Comments: Patient reports she ambulates without AD, Drives, fully independent.  Hand Dominance        Extremity/Trunk Assessment   Upper Extremity Assessment Upper Extremity Assessment: Generalized weakness    Lower Extremity Assessment Lower Extremity Assessment: Generalized weakness    Cervical / Trunk Assessment Cervical / Trunk Assessment: Normal  Communication   Communication: No difficulties  Cognition  Arousal/Alertness: Awake/alert Behavior During Therapy: WFL for tasks assessed/performed Overall Cognitive Status: Within Functional Limits for tasks assessed                                        General Comments      Exercises     Assessment/Plan    PT Assessment Patient needs continued PT services  PT Problem List Decreased strength;Decreased mobility;Decreased activity tolerance;Decreased balance;Decreased knowledge of use of DME;Decreased knowledge of precautions;Decreased safety awareness       PT Treatment Interventions DME instruction;Therapeutic activities;Gait training;Therapeutic exercise;Patient/family education;Functional mobility training;Balance training    PT Goals (Current goals can be found in the Care Plan section)  Acute Rehab PT Goals Patient Stated Goal: to get stronger, return home PT Goal Formulation: With patient Time For Goal Achievement: 04/11/20 Potential to Achieve Goals: Good    Frequency Min 2X/week   Barriers to discharge Decreased caregiver support      Co-evaluation               AM-PAC PT "6 Clicks" Mobility  Outcome Measure Help needed turning from your back to your side while in a flat bed without using bedrails?: None Help needed moving from lying on your back to sitting on the side of a flat bed without using bedrails?: None Help needed moving to and from a bed to a chair (including a wheelchair)?: A Little Help needed standing up from a chair using your arms (e.g., wheelchair or bedside chair)?: A Little Help needed to walk in hospital room?: A Lot Help needed climbing 3-5 steps with a railing? : A Lot 6 Click Score: 18    End of Session Equipment Utilized During Treatment: Gait belt Activity Tolerance: Patient limited by fatigue Patient left: in bed;with call bell/phone within reach;with bed alarm set Nurse Communication: Mobility status PT Visit Diagnosis: Unsteadiness on feet (R26.81);Muscle weakness  (generalized) (M62.81);Difficulty in walking, not elsewhere classified (R26.2)    Time: 5784-6962 PT Time Calculation (min) (ACUTE ONLY): 16 min   Charges:   PT Evaluation $PT Eval Moderate Complexity: 1 Mod         Alexah Kivett, PT, GCS 04/04/20,4:10 PM

## 2020-04-04 NOTE — Progress Notes (Signed)
Mobility Specialist - Progress Note   04/04/20 1350  Mobility  Activity Refused mobility  Mobility performed by Mobility specialist    Pt refused session d/t eating lunch at this time. Will re-attempt at a later date/time.    Camera Krienke Mobility Specialist  04/04/20, 1:51 PM

## 2020-04-04 NOTE — Plan of Care (Signed)
Continuing with plan of care. 

## 2020-04-05 DIAGNOSIS — C519 Malignant neoplasm of vulva, unspecified: Secondary | ICD-10-CM | POA: Diagnosis not present

## 2020-04-05 DIAGNOSIS — N179 Acute kidney failure, unspecified: Secondary | ICD-10-CM | POA: Diagnosis not present

## 2020-04-05 DIAGNOSIS — E1110 Type 2 diabetes mellitus with ketoacidosis without coma: Secondary | ICD-10-CM | POA: Diagnosis not present

## 2020-04-05 DIAGNOSIS — R112 Nausea with vomiting, unspecified: Secondary | ICD-10-CM | POA: Diagnosis not present

## 2020-04-05 LAB — CBC
HCT: 34.1 % — ABNORMAL LOW (ref 36.0–46.0)
Hemoglobin: 11.5 g/dL — ABNORMAL LOW (ref 12.0–15.0)
MCH: 31.7 pg (ref 26.0–34.0)
MCHC: 33.7 g/dL (ref 30.0–36.0)
MCV: 93.9 fL (ref 80.0–100.0)
Platelets: 199 10*3/uL (ref 150–400)
RBC: 3.63 MIL/uL — ABNORMAL LOW (ref 3.87–5.11)
RDW: 13.9 % (ref 11.5–15.5)
WBC: 9.3 10*3/uL (ref 4.0–10.5)
nRBC: 0 % (ref 0.0–0.2)

## 2020-04-05 LAB — RENAL FUNCTION PANEL
Albumin: 2.2 g/dL — ABNORMAL LOW (ref 3.5–5.0)
Anion gap: 10 (ref 5–15)
BUN: 7 mg/dL (ref 6–20)
CO2: 24 mmol/L (ref 22–32)
Calcium: 8.1 mg/dL — ABNORMAL LOW (ref 8.9–10.3)
Chloride: 102 mmol/L (ref 98–111)
Creatinine, Ser: 0.58 mg/dL (ref 0.44–1.00)
GFR, Estimated: >60 mL/min (ref >60)
Glucose, Bld: 247 mg/dL — ABNORMAL HIGH (ref 70–99)
Phosphorus: 2.3 mg/dL — ABNORMAL LOW (ref 2.5–4.6)
Potassium: 3.3 mmol/L — ABNORMAL LOW (ref 3.5–5.1)
Sodium: 136 mmol/L (ref 135–145)

## 2020-04-05 LAB — BASIC METABOLIC PANEL
Anion gap: 9 (ref 5–15)
BUN: 7 mg/dL (ref 6–20)
CO2: 25 mmol/L (ref 22–32)
Calcium: 8.2 mg/dL — ABNORMAL LOW (ref 8.9–10.3)
Chloride: 102 mmol/L (ref 98–111)
Creatinine, Ser: 0.59 mg/dL (ref 0.44–1.00)
GFR, Estimated: >60 mL/min (ref >60)
Glucose, Bld: 247 mg/dL — ABNORMAL HIGH (ref 70–99)
Potassium: 3.4 mmol/L — ABNORMAL LOW (ref 3.5–5.1)
Sodium: 136 mmol/L (ref 135–145)

## 2020-04-05 LAB — CULTURE, BLOOD (ROUTINE X 2): Culture: NO GROWTH

## 2020-04-05 LAB — GLUCOSE, CAPILLARY
Glucose-Capillary: 233 mg/dL — ABNORMAL HIGH (ref 70–99)
Glucose-Capillary: 206 mg/dL — ABNORMAL HIGH (ref 70–99)

## 2020-04-05 LAB — MAGNESIUM: Magnesium: 1.8 mg/dL (ref 1.7–2.4)

## 2020-04-05 MED ORDER — POTASSIUM CHLORIDE CRYS ER 20 MEQ PO TBCR
20.0000 meq | EXTENDED_RELEASE_TABLET | Freq: Once | ORAL | Status: AC
  Administered 2020-04-05: 20 meq via ORAL
  Filled 2020-04-05: qty 1

## 2020-04-05 MED ORDER — MAGNESIUM SULFATE 2 GM/50ML IV SOLN
2.0000 g | Freq: Once | INTRAVENOUS | Status: AC
  Administered 2020-04-05: 2 g via INTRAVENOUS
  Filled 2020-04-05: qty 50

## 2020-04-05 MED ORDER — INSULIN ASPART 100 UNIT/ML ~~LOC~~ SOLN: 0.0000 [IU] | Freq: Three times a day (TID) | SUBCUTANEOUS | 11 refills | Status: DC

## 2020-04-05 MED ORDER — K PHOS MONO-SOD PHOS DI & MONO 155-852-130 MG PO TABS
500.0000 mg | ORAL_TABLET | ORAL | Status: DC
  Administered 2020-04-05: 500 mg via ORAL
  Filled 2020-04-05 (×3): qty 2

## 2020-04-05 MED ORDER — NOVOLIN 70/30 FLEXPEN (70-30) 100 UNIT/ML ~~LOC~~ SUPN: 18.0000 [IU] | PEN_INJECTOR | Freq: Two times a day (BID) | SUBCUTANEOUS | 11 refills | Status: AC

## 2020-04-05 MED ORDER — LEVOFLOXACIN 750 MG PO TABS: 750.0000 mg | ORAL_TABLET | Freq: Every day | ORAL | 0 refills | Status: AC

## 2020-04-05 MED ORDER — RANOLAZINE ER 500 MG PO TB12: 500.0000 mg | ORAL_TABLET | Freq: Two times a day (BID) | ORAL | 0 refills | Status: AC

## 2020-04-05 MED ORDER — AMITRIPTYLINE HCL 25 MG PO TABS: 25.0000 mg | ORAL_TABLET | Freq: Every day | ORAL | 2 refills | Status: AC

## 2020-04-05 MED ORDER — DOCUSATE SODIUM 100 MG PO CAPS
200.0000 mg | ORAL_CAPSULE | Freq: Two times a day (BID) | ORAL | 0 refills | Status: DC
Start: 2020-04-05 — End: 2020-10-12

## 2020-04-05 MED ORDER — INSULIN ASPART PROT & ASPART (70-30 MIX) 100 UNIT/ML ~~LOC~~ SUSP
18.0000 [IU] | Freq: Two times a day (BID) | SUBCUTANEOUS | Status: DC
  Administered 2020-04-05: 18 [IU] via SUBCUTANEOUS
  Filled 2020-04-05: qty 10

## 2020-04-05 NOTE — Plan of Care (Signed)
Discharge teaching completed with patient who is in stable condition. 

## 2020-04-05 NOTE — Plan of Care (Signed)
Continuing with plan of care. 

## 2020-04-05 NOTE — TOC Transition Note (Signed)
Transition of Care [facility]) - CM/SW Discharge Note   Patient Details  Name: Jerney Baksh MRN: [id number] Date of Birth: 1965/09/21  Transition of Care [facility]) CM/SW Contact:  Harriet Masson, RN Phone Number:[phone] 04/05/2020, 2:20 PM   Clinical Narrative:    Request for HHPT/OT, RN, SW. PT evaluation also requested rolling walker. [facility]) notified with [facility] for services. Agency will also follow up with thier requested for rolling walker. RN unable to interview the pt for DME choices. [facility] will follow up on this request on set up on the initial home visit.   No additional needs at this time.    Barriers to Discharge: Continued Medical Work up   Patient Goals and CMS Choice        Discharge Placement                       Discharge Plan and Services     Post Acute Care Choice: NA                               Social Determinants of Health (SDOH) Interventions     Readmission Risk Interventions Readmission Risk Prevention Plan 04/01/2020  Transportation Screening Complete  PCP or Specialist Appt within 3-5 Days Complete  Social Work Consult for [place] Planning/Counseling [place] Not Applicable  Medication Review [employer] photographer) Complete  Some recent data might be hidden

## 2020-04-05 NOTE — Discharge Summary (Signed)
Physician Discharge Summary [facility]    Patient: Renee Mack                   Admit date: 03/30/2020   DOB: [birth year]             Discharge date:04/05/2020/8:19 AM POE:[id number]                          PCP: Renee Hauser, DO  Disposition: HOME with Home Health   Recommendations for Outpatient Follow-up:   . Follow up: in 1 week  Discharge Condition: Stable   Code Status:   Code Status: Full Code  Diet recommendation: Diabetic diet   Discharge Diagnoses:    Active Problems:   Diabetic ketoacidosis ([place])   DKA (diabetic ketoacidosis) ([place])   History of Present Illness/ Hospital Course Renee Mack Summary:  54 yo female with acomplex past medical history of several comorbidities includingStage III Vulvar Intraepithelial Neoplasia, Urinary Incontinence, Tobacco Abuse, Spinal Stenosis, Shingles, Pancreatitis, MI, Ischemic Cardiomyopathy (Echo 2019: EF 30-35%), HTN, Degenerative Disc Disease, GERD, CAD, COPD, Chronic Combined Systolic and Diastolic CHF, Cervical Disc Disease, chronic opioid use.  Admitted to PCCM service on 10/10 with DKA after presenting with nausea/vomiting and generalized pain. She was treated with insulin infusion, and has since been transitioned off drip to subcutaneous insulin.   Is being treated with IV Cefepime for bibasilar pneumonia on imaging, elevated procal and pt reports cough and generally ill at home prior to admission.  Hospitalist service assumed care of patient on 10/12.      Community-acquired Pneumonia - POA,  -Much improved, switching her IV antibiotics to p.o. Levaquin -Improved shortness of breath cough - bibasilar ground glass opacities on imaging, elevated procal.  Negative for Covid-19. -was on Cefepime x 5 days >>> to switch to oral as culture remain negative>>> PO Levaquin   Diabetic Ketoacidosis POA -Resolved  with blood glucose >864, pH 6.97, anion gap 27.  -Status post aggressive IV fluid  treatment, insulin drip -Initiated NPH yesterday 70/30 45 units >> twice daily Tolerating p.o.  Insulin-dependent Type 2 Diabetes with Insulin resistance - very poorly controlled with A1c 14.5%. Home insulin regimen is Lantus 100 units BID, Novolog 35 TID AC/HS. >>> has been switched to NPH twice daily -Last blood sugars: 99, 128, 149 this morning  --Titrating NovoLog 70/30  >> 18 units twice daily e    Anion gap metabolic acidosis / Lactic acidosis  -Resolved -Due to the DKA PH on admission was 6.97 with gap of 27, lactic acid initially 2.6.  Hyponatremia - POA, resolved due to DKA  Hypokalemia / Hypophosphatemia --Monitoring electrolytes, improved -Repleted with magnesium  Acute kidney injury - POA,  -Resolved  Due to pre-renal azotemia in setting of dehydration with DKA.   Vulvar intraepithelial neoplasia III / Vulvar cancer - followed by palliative care at home.  Oncology at [facility]. Chonic pain on opioids - home regimen was verified and resumed.    Ischemic cardiomyopathy / Chronic combined systolic diastolic CHF - appears euvolemic, compensated. EF was 45-50% on left heart cath in Dec 2019. -Status post IV fluid resuscitation -  discontinued Continue current medication including purulent    Coronary artery disease -  Denies any chest pain or shortness of breath --continued on ASA, Lipitor, Mylanta  Hypotension  -Remained hypotensive, her tricyclic amitriptyline has been reduced in dose, TED hose has been applied, Stable now   History of Hypertension -currently hypotensive not  on any hypertensive medications  Leukocytosis - POA, improving, monitoring Due to pneumonia and likely reactive in setting of acute illness. Continue antibiotics ..now po Levaquin   Hx of pancreatitis / elevate lipase - CT abdomen/pelvis did not show signs of pancreatitis, pt without abdominal complaints.  Pulmonary nodule - new 6 mm lingular nodule seen on CT.   Recommendation for follow up CT in 6-12 months           Discharge Instructions:   Discharge Instructions    Activity as tolerated - No restrictions   Complete by: As directed    Call MD for:  difficulty breathing, headache or visual disturbances   Complete by: As directed    Call MD for:  persistant nausea and vomiting   Complete by: As directed    Diet - low sodium heart healthy   Complete by: As directed    Discharge instructions   Complete by: As directed    Follow-up with [facility] for chronic pain management, current medical management -annual palliative care management Recommend reducing pain medication as is causing low blood pressure Compliant with current insulin regimen including checking blood sugar before meals Current NPH dose needs to be adjusted to obtain optimal blood glucose control   Increase activity slowly   Complete by: As directed        Medication List    STOP taking these medications   acetaminophen 325 MG tablet Commonly known as: TYLENOL   benzonatate 100 MG capsule Commonly known as: TESSALON   citalopram 40 MG tablet Commonly known as: CELEXA   esomeprazole 40 MG capsule Commonly known as: NEXIUM   insulin glargine 100 UNIT/ML injection Commonly known as: Lantus   LORazepam 1 MG tablet Commonly known as: ATIVAN   methocarbamol 500 MG tablet Commonly known as: ROBAXIN   ondansetron 4 MG disintegrating tablet Commonly known as: ZOFRAN-ODT   promethazine 25 MG tablet Commonly known as: PHENERGAN     TAKE these medications   amitriptyline 25 MG tablet Commonly known as: ELAVIL Take 1 tablet (25 mg total) by mouth at bedtime. What changed:   medication strength  how much to take  additional instructions   aspirin 81 MG tablet Take 81 mg by mouth daily.   atorvastatin 80 MG tablet Commonly known as: LIPITOR Take 1 tablet (80 mg total) by mouth daily at 6 PM.   BD Veo Insulin Syringe U/F 31G X 15/64" 0.5 ML  Misc Generic drug: Insulin Syringe-Needle U-100 use as directed 4 times daily   clonazePAM 0.5 MG tablet Commonly known as: KLONOPIN Take 0.5 mg by mouth 3 (three) times daily as needed.   docusate sodium 100 MG capsule Commonly known as: COLACE Take 2 capsules (200 mg total) by mouth 2 (two) times daily.   GlucoCom Blood Glucose Monitor Devi 1 m.   insulin aspart 100 UNIT/ML injection Commonly known as: novoLOG Inject 0-9 Units into the skin 3 (three) times daily with meals. To check blood sugars q. ACHS, follow the sliding scale provided Unit of insulin per sliding scale insulin What changed:   how much to take  when to take this  additional instructions   levofloxacin 750 MG tablet Commonly known as: LEVAQUIN Take 1 tablet (750 mg total) by mouth daily for 4 days.   mirtazapine 15 MG tablet Commonly known as: REMERON Take 15 mg by mouth at bedtime.   nitroGLYCERIN 0.4 MG SL tablet Commonly known as: NITROSTAT Place 1 tablet (0.4 mg total) under the  tongue every 5 (five) minutes as needed for chest pain.   NovoLIN 70/30 FlexPen (70-30) 100 UNIT/ML KwikPen Generic drug: insulin isophane & regular human Inject 18 Units into the skin 2 (two) times daily.   OxyCONTIN 60 MG 12 hr tablet Generic drug: oxyCODONE Take 1 tablet by mouth every 8 (eight) hours. What changed: Another medication with the same name was removed. Continue taking this medication, and follow the directions you see here.   polyethylene glycol powder 17 GM/SCOOP powder Commonly known as: GLYCOLAX/MIRALAX Take 17 g by mouth daily.   ProAir HFA 108 (90 Base) MCG/ACT inhaler Generic drug: albuterol Inhale 1 puff into the lungs every 6 (six) hours as needed for wheezing or shortness of breath.   ranolazine 500 MG 12 hr tablet Commonly known as: RANEXA Take 1 tablet (500 mg total) by mouth 2 (two) times daily. What changed:   medication strength  how much to take   [facility] [place]  Dev Misc AS DIRECTED   sucralfate 1 g tablet Commonly known as: CARAFATE Take 1 g by mouth 4 (four) times daily.   ticagrelor 90 MG Tabs tablet Commonly known as: BRILINTA Take 1 tablet (90 mg total) by mouth 2 (two) times daily. *NEEDS OFFICE VISIT FOR FURTHER REFILLS-PLEASE CALL [phone] TO SCHEDULE.*       Allergies  Allergen Reactions  . Bee Venom Itching, Shortness Of Breath and Swelling  . Metformin And Related Shortness Of Breath and Swelling  . Darvon [Propoxyphene] Itching  . Gabapentin Swelling  . Nsaids Other (See Comments)    Ulcers   . Tramadol Hives  . Contrast Media [Iodinated Diagnostic Agents] Rash    If you benadryl, and steroids she is able to take the contrast per pt     Procedures /Studies:   CT ABDOMEN PELVIS WO CONTRAST  Result Date: 03/30/2020 CLINICAL DATA:  Respiratory illness. Patient found on floor. Weakness. Pain. Indeterminate chest radiograph. EXAM: CT CHEST, ABDOMEN AND PELVIS WITHOUT CONTRAST TECHNIQUE: Multidetector CT imaging of the chest, abdomen and pelvis was performed following the standard protocol without IV contrast. COMPARISON:  Today's chest radiograph, dictated separately. Abdominopelvic CT 08/28/2018. CTA chest of 06/15/2018 is also reviewed. FINDINGS: CT CHEST FINDINGS Cardiovascular: Aortic atherosclerosis. Normal heart size, without pericardial effusion. Multivessel coronary artery atherosclerosis. Suspect at least 1 lad stent. Mediastinum/Nodes: No mediastinal or definite hilar adenopathy, given limitations of unenhanced CT. The esophagus is mildly dilated with fluid within. There may be thickening of the distal esophagus. Lungs/Pleura: No pleural fluid.  Moderate centrilobular emphysema. Lingular 6 mm nodule on 96/4 is new since the prior CT. Bilateral patchy areas of ground-glass opacity some of which demonstrate mild septal thickening. The most confluent is in the left lower lobe, corresponding to the plain film abnormality.  Others are seen in the right middle and right lower lobes. Musculoskeletal: No acute osseous abnormality. CT ABDOMEN PELVIS FINDINGS Hepatobiliary: Normal liver. Normal gallbladder, without biliary ductal dilatation. Pancreas: Normal, without mass or ductal dilatation. Spleen: Normal in size, without focal abnormality. Adrenals/Urinary Tract: Normal adrenal glands. Left renal vascular calcification. No renal calculi or hydronephrosis. Mild bladder distension. Stomach/Bowel: Mild gastric distension without cause identified. Stomach is primarily fluid-filled. Large colorectal stool burden. Normal terminal ileum and appendix. Minimal transverse colon again positioned within a left abdominal wall ventral hernia including on 77/2. Normal small bowel. Vascular/Lymphatic: Aortic atherosclerosis. No abdominopelvic adenopathy. Reproductive: Hysterectomy.  No adnexal mass. Other: No significant free fluid. Moderate pelvic floor laxity. Apparent soft tissue/skin thickening about  the perianal region including on 126/2. Other fat containing more cephalad ventral abdominal wall hernias. Musculoskeletal: Mild S shaped thoracolumbar spine curvature. IMPRESSION: 1. Multifocal lower lung predominant ground-glass opacities, most consistent with atypical infection. Favor COVID-19 pneumonia. 2. Nonspecific gastric distension, mild. 3.  Possible constipation. 4. Ventral abdominal wall hernias containing fat and nonobstructive transverse colon. 5. Aortic atherosclerosis (ICD10-I70.0) and emphysema (ICD10-J43.9). 6. Dilated, fluid-filled esophagus, suggesting dysmotility or gastroesophageal reflux. Possible distal esophageal wall thickening which could represent esophagitis. Cannot exclude an obstructive lesion or stricture. 7. Hepatic steatosis. 8. Pelvic floor laxity. Apparent soft tissue fullness about the perianal regions is most likely secondary to laxity. Consider physical exam correlation. 9. 6 mm lingular nodule. Non-contrast chest  CT at 6-12 months is recommended. If the nodule is stable at time of repeat CT, then future CT at 18-24 months (from today's scan) is considered optional for low-risk patients, but is recommended for high-risk patients. This recommendation follows the consensus statement: Guidelines for Management of Incidental Pulmonary Nodules Detected on CT Images: From the Fleischner Society 2017; Radiology 2017; 284:228-243. Electronically Signed   By: Abigail Miyamoto M.D.   On: 03/30/2020 07:27   CT Chest Wo Contrast  Result Date: 03/30/2020 CLINICAL DATA:  Respiratory illness. Patient found on floor. Weakness. Pain. Indeterminate chest radiograph. EXAM: CT CHEST, ABDOMEN AND PELVIS WITHOUT CONTRAST TECHNIQUE: Multidetector CT imaging of the chest, abdomen and pelvis was performed following the standard protocol without IV contrast. COMPARISON:  Today's chest radiograph, dictated separately. Abdominopelvic CT 08/28/2018. CTA chest of 06/15/2018 is also reviewed. FINDINGS: CT CHEST FINDINGS Cardiovascular: Aortic atherosclerosis. Normal heart size, without pericardial effusion. Multivessel coronary artery atherosclerosis. Suspect at least 1 lad stent. Mediastinum/Nodes: No mediastinal or definite hilar adenopathy, given limitations of unenhanced CT. The esophagus is mildly dilated with fluid within. There may be thickening of the distal esophagus. Lungs/Pleura: No pleural fluid.  Moderate centrilobular emphysema. Lingular 6 mm nodule on 96/4 is new since the prior CT. Bilateral patchy areas of ground-glass opacity some of which demonstrate mild septal thickening. The most confluent is in the left lower lobe, corresponding to the plain film abnormality. Others are seen in the right middle and right lower lobes. Musculoskeletal: No acute osseous abnormality. CT ABDOMEN PELVIS FINDINGS Hepatobiliary: Normal liver. Normal gallbladder, without biliary ductal dilatation. Pancreas: Normal, without mass or ductal dilatation. Spleen:  Normal in size, without focal abnormality. Adrenals/Urinary Tract: Normal adrenal glands. Left renal vascular calcification. No renal calculi or hydronephrosis. Mild bladder distension. Stomach/Bowel: Mild gastric distension without cause identified. Stomach is primarily fluid-filled. Large colorectal stool burden. Normal terminal ileum and appendix. Minimal transverse colon again positioned within a left abdominal wall ventral hernia including on 77/2. Normal small bowel. Vascular/Lymphatic: Aortic atherosclerosis. No abdominopelvic adenopathy. Reproductive: Hysterectomy.  No adnexal mass. Other: No significant free fluid. Moderate pelvic floor laxity. Apparent soft tissue/skin thickening about the perianal region including on 126/2. Other fat containing more cephalad ventral abdominal wall hernias. Musculoskeletal: Mild S shaped thoracolumbar spine curvature. IMPRESSION: 1. Multifocal lower lung predominant ground-glass opacities, most consistent with atypical infection. Favor COVID-19 pneumonia. 2. Nonspecific gastric distension, mild. 3.  Possible constipation. 4. Ventral abdominal wall hernias containing fat and nonobstructive transverse colon. 5. Aortic atherosclerosis (ICD10-I70.0) and emphysema (ICD10-J43.9). 6. Dilated, fluid-filled esophagus, suggesting dysmotility or gastroesophageal reflux. Possible distal esophageal wall thickening which could represent esophagitis. Cannot exclude an obstructive lesion or stricture. 7. Hepatic steatosis. 8. Pelvic floor laxity. Apparent soft tissue fullness about the perianal regions is most likely secondary  to laxity. Consider physical exam correlation. 9. 6 mm lingular nodule. Non-contrast chest CT at 6-12 months is recommended. If the nodule is stable at time of repeat CT, then future CT at 18-24 months (from today's scan) is considered optional for low-risk patients, but is recommended for high-risk patients. This recommendation follows the consensus statement:  Guidelines for Management of Incidental Pulmonary Nodules Detected on CT Images: From the Fleischner Society 2017; Radiology 2017; 284:228-243. Electronically Signed   By: Abigail Miyamoto M.D.   On: 03/30/2020 07:27   DG Chest Port 1 View  Result Date: 03/30/2020 CLINICAL DATA:  Possible sepsis.  Found down. EXAM: PORTABLE CHEST 1 VIEW COMPARISON:  08/29/2018 FINDINGS: Numerous leads and wires project over the chest. Midline trachea. Normal heart size. Atherosclerosis in the transverse aorta. No pleural effusion or pneumothorax. Mild hyperinflation and interstitial thickening. Somewhat more confluent increased density in the left lung base, along the left heart border. IMPRESSION: Hyperinflation and interstitial thickening, likely related to COPD/chronic bronchitis. Somewhat more focal left base opacity. This could represent scarring superimposed upon interstitial thickening. Early pneumonia could look similar. If there are symptoms to localize left lower lobe pneumonia, recommend radiographic follow-up in 4-6 days. Aortic Atherosclerosis (ICD10-I70.0). Electronically Signed   By: Abigail Miyamoto M.D.   On: 03/30/2020 04:22    Subjective:   Patient was seen and examined 04/05/2020, 8:19 AM Patient stable today. No acute distress.  No issues overnight Stable for discharge.  Discharge Exam:    Vitals:   04/04/20 2122 04/05/20 0022 04/05/20 0459 04/05/20 0504  BP: 105/60 (!) 103/51 (!) 104/53   Pulse: 85 83 80   Resp: 20 17 20    Temp: 99.8 F (37.7 C) 98.1 F (36.7 C) 97.8 F (36.6 C)   TempSrc: Oral  Oral   SpO2: 93% 94% 95%   Weight:    62.2 kg  Height:        General: Pt lying comfortably in bed & appears in no obvious distress. Cardiovascular: S1 & S2 heard, RRR, S1/S2 +. No murmurs, rubs, gallops or clicks. No JVD or pedal edema. Respiratory: Clear to auscultation without wheezing, rhonchi or crackles. No increased work of breathing. Abdominal:  Non-distended, non-tender & soft. No  organomegaly or masses appreciated. Normal bowel sounds heard. CNS: Alert and oriented. No focal deficits. Extremities: no edema, no cyanosis    The results of significant diagnostics from this hospitalization (including imaging, microbiology, ancillary and laboratory) are listed below for reference.      Microbiology:   Recent Results (from the past 240 hour(s))  Culture, blood (Routine X 2) w Reflex to ID Panel     Status: None   Collection Time: 03/30/20  2:23 AM   Specimen: BLOOD  Result Value Ref Range Status   Specimen Description BLOOD BLOOD RIGHT FOREARM  Final   Special Requests   Final    BOTTLES DRAWN AEROBIC AND ANAEROBIC Blood Culture results may not be optimal due to an inadequate volume of blood received in culture bottles   Culture   Final    NO GROWTH 5 DAYS Performed at [facility], [address] [place], [place] [address]    Report Status 04/05/2020 FINAL  Final  Urine culture     Status: None   Collection Time: 03/30/20  3:04 AM   Specimen: Urine, Random  Result Value Ref Range Status   Specimen Description   Final    URINE, RANDOM Performed at [facility], [address] [place], [place] [address]    Special Requests   Final    NONE Performed at [facility], [address] [place], [place] [address]    Culture   Final    NO GROWTH Performed at [facility], [place] [address] [place], [place] [address]    Report Status 03/31/2020 FINAL  Final  Blood Culture (routine x 2)     Status: None   Collection Time: 03/30/20  3:05 AM   Specimen: BLOOD  Result Value Ref Range Status   Specimen Description BLOOD BLOOD LEFT FOREARM  Final   Special Requests   Final    BOTTLES DRAWN AEROBIC AND ANAEROBIC Blood Culture results may not be optimal due to an inadequate volume of blood received in culture bottles   Culture   Final    NO GROWTH 5 DAYS Performed at [facility], [address] [place], [place]  [address]    Report Status 04/04/2020 FINAL  Final  Respiratory Panel by RT PCR (Flu A&B, Covid) - Nasopharyngeal Swab     Status: None   Collection Time: 03/30/20  3:05 AM   Specimen: Nasopharyngeal Swab  Result Value Ref Range Status   SARS Coronavirus 2 by RT PCR NEGATIVE NEGATIVE Final    Comment: (NOTE) SARS-CoV-2 target nucleic acids are NOT DETECTED.  The SARS-CoV-2 RNA is generally detectable in upper respiratoy specimens during the acute phase of infection. The lowest concentration of SARS-CoV-2 viral copies this assay can detect is 131 copies/mL. A negative result does not preclude SARS-Cov-2 infection and should not be used as the sole basis for treatment or other patient management decisions. A negative result may occur with  improper specimen collection/handling, submission of specimen other than nasopharyngeal swab, presence of viral mutation(s) within the areas targeted by this assay, and inadequate number of viral copies (<131 copies/mL). A negative result must be combined with clinical observations, patient history, and epidemiological information. The expected result is Negative.  Fact Sheet for Patients:  PinkCheek.be  Fact Sheet for Healthcare Providers:  GravelBags.it  This test is no t yet approved or cleared by the [place] FDA and  has been authorized for detection and/or diagnosis of SARS-CoV-2 by FDA under an Emergency Use Authorization (EUA). This EUA will remain  in effect (meaning this test can be used) for the duration of the COVID-19 declaration under Section 564(b)(1) of the Act, 21 U.S.C. section 360bbb-3(b)(1), unless the authorization is terminated or revoked sooner.     Influenza A by PCR NEGATIVE NEGATIVE Final   Influenza B by PCR NEGATIVE NEGATIVE Final    Comment: (NOTE) The Xpert Xpress SARS-CoV-2/FLU/RSV assay is intended as an aid in  the diagnosis of influenza from  Nasopharyngeal swab specimens and  should not be used as a sole basis for treatment. Nasal washings and  aspirates are unacceptable for Xpert Xpress SARS-CoV-2/FLU/RSV  testing.  Fact Sheet for Patients: PinkCheek.be  Fact Sheet for Healthcare Providers: GravelBags.it  This test is not yet approved or cleared by the [place] FDA and  has been authorized for detection and/or diagnosis of SARS-CoV-2 by  FDA under an Emergency Use Authorization (EUA). This EUA will remain  in effect (meaning this test can be used) for the duration of the  Covid-19 declaration under Section 564(b)(1) of the Act, 21  U.S.C. section 360bbb-3(b)(1), unless the authorization is  terminated or revoked. Performed at [facility], [address] [place],  [address]  Labs:   CBC: Recent Labs  Lab 03/30/20 0304 03/30/20 0304 03/31/20 0223 04/01/20 0606 04/03/20 0317 04/04/20 0519 04/05/20 0259  WBC 22.6*   < > 20.8* 11.0* 8.8 10.6* 9.3  NEUTROABS 14.6*  --  18.5*  --   --   --   --   HGB 17.6*   < > 15.2* 14.1 11.7* 12.3 11.5*  HCT 55.1*   < > 42.8 40.1 33.8* 36.1 34.1*  MCV 99.5   < > 89.9 91.3 92.3 93.0 93.9  PLT 353   < > 214 170 148* 160 199   < > = values in this interval not displayed.   Basic Metabolic Panel: Recent Labs  Lab 04/01/20 0606 04/01/20 0606 04/02/20 0448 04/03/20 0317 04/03/20 1249 04/04/20 0519 04/05/20 0259  NA 138  --  140 140  --  140 136  136  K 3.1*   < > 3.7 2.5* 3.5 4.2 3.3*  3.4*  CL 112*  --  113* 108  --  105 102  102  CO2 20*  --  21* 25  --  28 24  25   GLUCOSE 139*  --  94 99  --  154* 247*  247*  BUN 34*  --  16 8  --  9 7  7   CREATININE 0.63  --  0.48 0.40*  --  0.57 0.58  0.59  CALCIUM 8.7*  --  8.7* 8.2*  --  8.5* 8.1*  8.2*  MG 2.5*  --  2.2 2.1  --  2.1 1.8  PHOS 2.0*  --  2.1* 3.3  --  2.1* 2.3*   < > = values in this interval not displayed.   Liver  Function Tests: Recent Labs  Lab 03/30/20 0304 04/01/20 0606 04/05/20 0259  AST 16 8*  --   ALT 7 <5  --   ALKPHOS 94 57  --   BILITOT 3.5* 0.6  --   PROT 8.8* 6.3*  --   ALBUMIN 4.0 2.8* 2.2*   BNP (last 3 results) No results for input(s): BNP in the last 8760 hours. Cardiac Enzymes: No results for input(s): CKTOTAL, CKMB, CKMBINDEX, TROPONINI in the last 168 hours. CBG: Recent Labs  Lab 04/04/20 0728 04/04/20 1154 04/04/20 1626 04/04/20 2124 04/05/20 0727  GLUCAP 149* 114* 168* 162* 233*   Hgb A1c No results for input(s): HGBA1C in the last 72 hours. Lipid Profile No results for input(s): CHOL, HDL, LDLCALC, TRIG, CHOLHDL, LDLDIRECT in the last 72 hours. Thyroid function studies No results for input(s): TSH, T4TOTAL, T3FREE, THYROIDAB in the last 72 hours.  Invalid input(s): FREET3 Anemia work up No results for input(s): VITAMINB12, FOLATE, FERRITIN, TIBC, IRON, RETICCTPCT in the last 72 hours. Urinalysis    Component Value Date/Time   COLORURINE YELLOW (A) 03/30/2020 0304   APPEARANCEUR HAZY (A) 03/30/2020 0304   APPEARANCEUR Hazy 04/19/2014 0925   LABSPEC 1.021 03/30/2020 0304   LABSPEC 1.006 04/19/2014 0925   PHURINE 5.0 03/30/2020 0304   GLUCOSEU >=500 (A) 03/30/2020 0304   GLUCOSEU >=500 04/19/2014 0925   HGBUR SMALL (A) 03/30/2020 0304   BILIRUBINUR NEGATIVE 03/30/2020 0304   BILIRUBINUR Negative 04/19/2014 0925   KETONESUR 80 (A) 03/30/2020 0304   PROTEINUR 100 (A) 03/30/2020 0304   NITRITE NEGATIVE 03/30/2020 0304   LEUKOCYTESUR NEGATIVE 03/30/2020 0304   LEUKOCYTESUR 3+ 04/19/2014 0925         Time coordinating discharge: Over 45 minutes  SIGNED: Deatra James, MD, FACP, FHM.  [facility],  Please use [email or web address] to Page If 7PM-7AM, please contact night-coverage [email or web address] [facility] 04/05/2020, 8:19 AM

## 2020-04-05 NOTE — Progress Notes (Signed)
PHARMACY CONSULT NOTE  Pharmacy Consult for Electrolyte Monitoring and Replacement   Recent Labs: Potassium (mmol/L)  Date Value  04/05/2020 3.3 (L)  04/05/2020 3.4 (L)  04/19/2014 4.2   Magnesium (mg/dL)  Date Value  04/05/2020 1.8   Calcium (mg/dL)  Date Value  04/05/2020 8.1 (L)  04/05/2020 8.2 (L)   Calcium, Total (mg/dL)  Date Value  04/19/2014 9.3   Albumin (g/dL)  Date Value  04/05/2020 2.2 (L)  04/19/2014 3.8   Phosphorus (mg/dL)  Date Value  04/05/2020 2.3 (L)   Sodium (mmol/L)  Date Value  04/05/2020 136  04/05/2020 136  04/19/2014 129 (L)   Corrected Ca: 9.46 mg/dL  Assessment: Patient is a 54 y/o F with medical history including stage III vulvar intraepithelial neoplasia, pancreatitis, ischemic cardiomyopathy, diabetes, CHF admitted with diabetic ketoacidosis. Pharmacy has been consulted to assist with electrolyte monitor and replacement.    Goal of Therapy:  Electrolytes WNL   Plan:  K 3.3  Mag 1.8  Phos 2.3  Scr 0.59  KCL 20 meq PO x1 dose  K-Phos neutral tablet 500 mg po x 3 doses  Magnesium sulfate 2 gm IV x 1  f/u electrolytes with AM labs.   Chinita Greenland PharmD Clinical Pharmacist 04/05/2020

## 2020-04-15 ENCOUNTER — Telehealth: Payer: Self-pay | Admitting: Family Medicine

## 2020-04-15 DIAGNOSIS — F418 Other specified anxiety disorders: Secondary | ICD-10-CM

## 2020-04-15 DIAGNOSIS — K219 Gastro-esophageal reflux disease without esophagitis: Secondary | ICD-10-CM

## 2020-04-16 MED ORDER — ESOMEPRAZOLE MAGNESIUM 40 MG PO CPDR: 40.0000 mg | DELAYED_RELEASE_CAPSULE | Freq: Every day | ORAL | 1 refills | Status: DC

## 2020-04-16 NOTE — Addendum Note (Signed)
Addended by: Olin Hauser on: 04/16/2020 05:25 PM   Modules accepted: Orders

## 2020-04-16 NOTE — Telephone Encounter (Addendum)
Pt called in to follow up on Rx for Nexium. pt says that she is still currently taking medication and would like a refill    [phone]

## 2020-04-17 ENCOUNTER — Telehealth (INDEPENDENT_AMBULATORY_CARE_PROVIDER_SITE_OTHER): Payer: Medicare Other | Admitting: Family Medicine

## 2020-04-17 ENCOUNTER — Other Ambulatory Visit: Payer: Self-pay

## 2020-04-17 ENCOUNTER — Encounter: Payer: Self-pay | Admitting: Family Medicine

## 2020-04-17 VITALS — BP 136/60 | Temp 98.1°F

## 2020-04-17 DIAGNOSIS — K219 Gastro-esophageal reflux disease without esophagitis: Secondary | ICD-10-CM | POA: Diagnosis not present

## 2020-04-17 MED ORDER — FAMOTIDINE 20 MG PO TABS: 20.0000 mg | ORAL_TABLET | Freq: Two times a day (BID) | ORAL | 1 refills | Status: DC

## 2020-04-17 MED ORDER — ESOMEPRAZOLE MAGNESIUM 40 MG PO CPDR: 40.0000 mg | DELAYED_RELEASE_CAPSULE | Freq: Every day | ORAL | 1 refills | Status: DC

## 2020-04-17 NOTE — Assessment & Plan Note (Signed)
Uncontrolled, has been taking double the dose of her esomeprazole, discussed taking 40mg  only daily and adding in an H2 blocker to help with her GERD symptoms.  Will start on famotidine 20mg  BID to help with GERD in addition to esomeprazole.  Discussed if having continued symptoms once taking this medication, will refer to GI for evaluation.

## 2020-04-17 NOTE — Progress Notes (Signed)
Virtual Visit via Telephone  The purpose of this virtual visit is to provide medical care while limiting exposure to the novel coronavirus (COVID19) for both patient and office staff.  Consent was obtained for phone visit:  Yes.   Answered questions that patient had about telehealth interaction:  Yes.   I discussed the limitations, risks, security and privacy concerns of performing an evaluation and management service by telephone. I also discussed with the patient that there may be a patient responsible charge related to this service. The patient expressed understanding and agreed to proceed.  Patient is at home and is accessed via telephone Services are provided by Harlin Rain, FNP-C from [facility])  ---------------------------------------------------------------------- Chief Complaint  Patient presents with  . Gastroesophageal Reflux    need refill Nexium    S: Reviewed CMA documentation. I have called patient and gathered additional HPI as follows:  Renee Mack presents to clinic for telephone virtual visit.  Reports she has been taking her esomeprazole 41m daily, instead of 1 capsule she has been taking 2 daily for her GERD symptoms.  Has not found much relief with this treatment plan.  Has not taken an H2 blocker for her symptoms or met with gastroenterology.  Patient is currently home Denies any high risk travel to areas of current concern for COVID19. Denies any known or suspected exposure to person with or possibly with COVID19.  Past Medical History:  Diagnosis Date  . Cancer ([place]    vulvular  . Cervical disc disease    a. 01/2018 MRI Cervical spine: Cervical spondylosis with multilevel disc and facet degeneration greatest at C5-6 and C6-7.  Moderate to sev R C5-6, mod L C5-6, and mod bilat C6-7 foraminal stenosis with multilevel mild foraminal stenosis.  Mild C5-6 and mod C6-7 canal stenosis. C6-7 central cord impingement w/ cord flattening.   . Chronic combined systolic (congestive) and diastolic (congestive) heart failure ([place]yps[place]    a. 12/2017 Echo: EF 30-35%, mid-apicalanteroseptal, ant, and apical AK. Gr1 DD. Mild conc LVH; b. 03/2018 Echo: EF 45-50%, antsept, ant HK. Mild MR. Nl LA size. Nl RV fxn.  .Marland KitchenCOPD (chronic obstructive pulmonary disease) (HCC)    not on home oxygen  . Coronary artery disease    a. 12/2017 ACS/PCI: LAD 100p (2.25x26 Resolute Onyx DES), 899m2.0x12 Resolute Onyx DES), 80d (2.0x15 Resolute Onyx DES), RCA 90p (non-dominant). EF 25-35%. Post-MI course complicated by CGS; b. 7/1/0272STEMI/subacute thrombosis-->LAD 100 (PTCA + DES x 1); c. 01/2018 NSTEMI/PCI: LM min irregs, LAD 50-60p ISR/hazy (3.5x12 Resolute DES), 6025m(underexpansion of prior stents-->PTCA). EF 45-50%.  . GMarland KitchenRD (gastroesophageal reflux disease)   . H/O degenerative disc disease   . Hypotension   . Ischemic cardiomyopathy    a. 12/2017 Echo: EF 30-35%; b. 03/2018 Echo: EF 45-50%.  . Myocardial infarction (HCC[place]  a. 12/2017-->DES to LAD x 3.  . Pancreatitis   . Shingles   . Spinal stenosis   . Tobacco abuse   . Ulcer (traumatic) of oral mucosa   . Urinary incontinence   . VIN III (vulvar intraepithelial neoplasia III)    Social History   Tobacco Use  . Smoking status: Former Smoker    Packs/day: 0.50    Years: 30.00    Pack years: 15.00    Types: Cigarettes    Quit date: 12/19/2017    Years since quitting: 2.3  . 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  Vaping Use  .  Vaping Use: Never used  Substance Use Topics  . Alcohol use: No  . Drug use: No    Current Outpatient Medications:  .  albuterol (PROAIR HFA) 108 (90 Base) MCG/ACT inhaler, Inhale 1 puff into the lungs every 6 (six) hours as needed for wheezing or shortness of breath. , Disp: , Rfl:  .  amitriptyline (ELAVIL) 25 MG tablet, Take 1 tablet (25 mg total) by mouth at bedtime. (Patient taking differently: Take 75 mg by mouth at bedtime. ), Disp:  30 tablet, Rfl: 2 .  aspirin 81 MG tablet, Take 81 mg by mouth daily., Disp: , Rfl:  .  Blood Glucose Monitoring Suppl (GLUCOCOM BLOOD GLUCOSE MONITOR) DEVI, 1 m., Disp: , Rfl:  .  clonazePAM (KLONOPIN) 0.5 MG tablet, Take 0.5 mg by mouth 3 (three) times daily as needed., Disp: , Rfl:  .  docusate sodium (COLACE) 100 MG capsule, Take 2 capsules (200 mg total) by mouth 2 (two) times daily., Disp: 10 capsule, Rfl: 0 .  esomeprazole (NEXIUM) 40 MG capsule, Take 1 capsule (40 mg total) by mouth daily before breakfast., Disp: 90 capsule, Rfl: 1 .  insulin isophane & regular human (NOVOLIN 70/30 FLEXPEN) (70-30) 100 UNIT/ML KwikPen, Inject 18 Units into the skin 2 (two) times daily., Disp: 15 mL, Rfl: 11 .  Lancet Devices (SIMPLE DIAGNOSTICS LANCING DEV) MISC, AS DIRECTED, Disp: , Rfl:  .  Lidocaine HCl 4 % GEL, Apply topically., Disp: , Rfl:  .  mirtazapine (REMERON) 15 MG tablet, Take 15 mg by mouth at bedtime., Disp: , Rfl:  .  nitroGLYCERIN (NITROSTAT) 0.4 MG SL tablet, Place 1 tablet (0.4 mg total) under the tongue every 5 (five) minutes as needed for chest pain., Disp: 10 tablet, Rfl: 0 .  ondansetron (ZOFRAN-ODT) 4 MG disintegrating tablet, Take by mouth., Disp: , Rfl:  .  oxyCODONE (OXYCONTIN) 60 MG 12 hr tablet, Take 1 tablet by mouth every 8 (eight) hours., Disp: , Rfl:  .  Oxycodone HCl 20 MG TABS, Take 20 mg by mouth every 3 (three) hours as needed. , Disp: , Rfl:  .  polyethylene glycol powder (GLYCOLAX/MIRALAX) 17 GM/SCOOP powder, Take 17 g by mouth daily. , Disp: , Rfl:  .  ranolazine (RANEXA) 500 MG 12 hr tablet, Take 1 tablet (500 mg total) by mouth 2 (two) times daily., Disp: 60 tablet, Rfl: 0 .  sucralfate (CARAFATE) 1 g tablet, Take 1 g by mouth 4 (four) times daily., Disp: , Rfl:  .  ticagrelor (BRILINTA) 90 MG TABS tablet, Take 1 tablet (90 mg total) by mouth 2 (two) times daily. *NEEDS OFFICE VISIT FOR FURTHER REFILLS-PLEASE CALL [phone] TO SCHEDULE.*, Disp: 60 tablet, Rfl:  0 .  atorvastatin (LIPITOR) 80 MG tablet, Take 1 tablet (80 mg total) by mouth daily at 6 PM., Disp: 90 tablet, Rfl: 0 .  BD VEO INSULIN SYRINGE U/F 31G X 15/64" 0.5 ML MISC, use as directed 4 times daily (Patient not taking: Reported on 04/17/2020), Disp: 300 each, Rfl: 5 .  famotidine (PEPCID) 20 MG tablet, Take 1 tablet (20 mg total) by mouth 2 (two) times daily., Disp: 180 tablet, Rfl: 1 .  insulin aspart (NOVOLOG) 100 UNIT/ML injection, Inject 0-9 Units into the skin 3 (three) times daily with meals. To check blood sugars q. ACHS, follow the sliding scale provided Unit of insulin per sliding scale insulin (Patient not taking: Reported on 04/17/2020), Disp: 10 mL, Rfl: 11 .  SSD 1 % cream, SMARTSIG:1 Topical 4-5 Times  Daily, Disp: , Rfl:   Depression screen [facility] 2/9 11/20/2019 07/27/2019 04/26/2019  Decreased Interest 2 0 2  Down, Depressed, Hopeless _0 PHQ - 2 Score _1 Altered sleeping 1 0 2  Tired, decreased energy _2 Change in appetite _3 Feeling bad or failure about yourself  1 0 0  Trouble concentrating 3 0 1  Moving slowly or fidgety/restless 0 0 2  Suicidal thoughts 0 0 0  PHQ-9 Score _4 Difficult doing work/chores Very difficult Not difficult at all Very difficult    GAD 7 : Generalized Anxiety Score 04/26/2019 12/07/2018 09/04/2018 05/31/2018  Nervous, Anxious, on Edge _5 Control/stop worrying _6 Worry too much - different things _7 Trouble relaxing _8 Restless 0 _9 Easily annoyed or irritable 1 0 2 1  Afraid - awful might happen _10 Total GAD 7 Score _11 Anxiety Difficulty Somewhat difficult Somewhat difficult Not difficult at all Not difficult at all    -------------------------------------------------------------------------- O: No physical exam performed due to remote telephone encounter.  Physical Exam: Patient remotely monitored without video.  Verbal communication appropriate.  Cognition  normal.   Recent Results (from the past 2160 hour(s))  Culture, blood (Routine X 2) w Reflex to ID Panel     Status: None   Collection Time: 03/30/20  2:23 AM   Specimen: BLOOD  Result Value Ref Range   Specimen Description BLOOD BLOOD RIGHT FOREARM    Special Requests      BOTTLES DRAWN AEROBIC AND ANAEROBIC Blood Culture results may not be optimal due to an inadequate volume of blood received in culture bottles   Culture      NO GROWTH 5 DAYS Performed at [facility], [address] [place], [place] [address]    Report Status 04/05/2020 FINAL   Lactic acid, plasma     Status: Abnormal   Collection Time: 03/30/20  3:04 AM  Result Value Ref Range   Lactic Acid, Venous 2.6 (HH) 0.5 - 1.9 mmol/L    Comment: CRITICAL RESULT CALLED TO, READ BACK BY AND VERIFIED WITH REBEKAH BAXTER ON 03/30/20 AT 0337 BY JAG Performed at [facility], [address] [place], [place] [address]   Comprehensive metabolic panel     Status: Abnormal   Collection Time: 03/30/20  3:04 AM  Result Value Ref Range   Sodium 124 (L) 135 - 145 mmol/L    Comment: LYTES REPEATED / JAG   Potassium 5.0 3.5 - 5.1 mmol/L    Comment: HEMOLYSIS AT THIS LEVEL MAY AFFECT RESULT   Chloride 89 (L) 98 - 111 mmol/L   CO2 8 (L) 22 - 32 mmol/L   Glucose, Bld 864 (HH) 70 - 99 mg/dL    Comment: CRITICAL RESULT CALLED TO, READ BACK BY AND VERIFIED WITH REBEKAH BRAXTER ON 03/30/20 AT 0413 BY JAG Glucose reference range applies only to samples taken after fasting for at least 8 hours.    BUN 47 (H) 6 - 20 mg/dL   Creatinine, Ser 2.20 (H) 0.44 - 1.00 mg/dL   Calcium 9.0 8.9 - 10.3 mg/dL   Total Protein 8.8 (H) 6.5 - 8.1 g/dL   Albumin 4.0 3.5 - 5.0 g/dL   AST 16 15 - 41 U/L    Comment: HEMOLYSIS AT THIS LEVEL  MAY AFFECT RESULT   ALT 7 0 - 44 U/L    Comment: HEMOLYSIS AT THIS LEVEL MAY AFFECT RESULT   Alkaline Phosphatase 94 38 - 126 U/L   Total Bilirubin 3.5 (H) 0.3 - 1.2 mg/dL    Comment: HEMOLYSIS  AT THIS LEVEL MAY AFFECT RESULT   GFR, Estimated 24 (L) >60 mL/min   Anion gap 27 (H) 5 - 15    Comment: Performed at [facility], [address] [place], [place] [address]  CBC WITH DIFFERENTIAL     Status: Abnormal   Collection Time: 03/30/20  3:04 AM  Result Value Ref Range   WBC 22.6 (H) 4.0 - 10.5 K/uL   RBC 5.54 (H) 3.87 - 5.11 MIL/uL   Hemoglobin 17.6 (H) 12.0 - 15.0 g/dL   HCT 55.1 (H) 36 - 46 %   MCV 99.5 80.0 - 100.0 fL   MCH 31.8 26.0 - 34.0 pg   MCHC 31.9 30.0 - 36.0 g/dL   RDW 13.8 11.5 - 15.5 %   Platelets 353 150 - 400 K/uL   nRBC 0.0 0.0 - 0.2 %   Neutrophils Relative % 65 %   Neutro Abs 14.6 (H) 1.7 - 7.7 K/uL   Lymphocytes Relative 7 %   Lymphs Abs 1.5 0.7 - 4.0 K/uL   Monocytes Relative 6 %   Monocytes Absolute 1.5 (H) 0.1 - 1.0 K/uL   Eosinophils Relative 20 %   Eosinophils Absolute 4.5 (H) 0.0 - 0.5 K/uL   Basophils Relative 0 %   Basophils Absolute 0.1 0.0 - 0.1 K/uL   Immature Granulocytes 2 %   Abs Immature Granulocytes 0.47 (H) 0.00 - 0.07 K/uL    Comment: Performed at [facility], [address] [place], [place] [address]  Protime-INR     Status: None   Collection Time: 03/30/20  3:04 AM  Result Value Ref Range   Prothrombin Time 15.0 11.4 - 15.2 seconds   INR 1.2 0.8 - 1.2    Comment: (NOTE) INR goal varies based on device and disease states. Performed at [facility], [address] [place], [place] [address]   Urinalysis, Complete w Microscopic     Status: Abnormal   Collection Time: 03/30/20  3:04 AM  Result Value Ref Range   Color, Urine YELLOW (A) YELLOW   APPearance HAZY (A) CLEAR   Specific Gravity, Urine 1.021 1.005 - 1.030   pH 5.0 5.0 - 8.0   Glucose, UA >=500 (A) NEGATIVE mg/dL   Hgb urine dipstick SMALL (A) NEGATIVE   Bilirubin Urine NEGATIVE NEGATIVE   Ketones, ur 80 (A) NEGATIVE mg/dL   Protein, ur 100 (A) NEGATIVE mg/dL   Nitrite NEGATIVE NEGATIVE   Leukocytes,Ua NEGATIVE NEGATIVE   RBC  / HPF 0-5 0 - 5 RBC/hpf   WBC, UA 0-5 0 - 5 WBC/hpf   Bacteria, UA NONE SEEN NONE SEEN   Squamous Epithelial / LPF 0-5 0 - 5   Mucus PRESENT    Granular Casts, UA PRESENT     Comment: Performed at [facility], [address], [place], [place] [address]  Urine culture     Status: None   Collection Time: 03/30/20  3:04 AM   Specimen: Urine, Random  Result Value Ref Range   Specimen Description      URINE, RANDOM Performed at [facility], [address], [place], [place] [address]    Special Requests      NONE Performed at [facility], [address] [place], [place] [address]    Culture      NO GROWTH Performed at [facility], [address], [place], [place] [address]    Report Status 03/31/2020 FINAL   Lipase, blood     Status: Abnormal   Collection Time: 03/30/20  3:04 AM  Result Value Ref Range   Lipase 631 (H) 11 - 51 U/L    Comment: RESULT CONFIRMED BY MANUAL DILUTION / JAG Performed at [facility], [address] [place], [place] [address]   Blood gas, venous     Status: Abnormal   Collection Time: 03/30/20  3:04 AM  Result Value Ref Range   pH, Ven 6.97 (LL) 7.25 - 7.43    Comment: CRITICAL RESULT CALLED TO, READ BACK BY AND VERIFIED WITH: REBECCA RN [id number] DT    pCO2, Ven 26 (L) 44 - 60 mmHg   pO2, Ven 45.0 32 - 45 mmHg   Bicarbonate 6.0 (L) 20.0 - 28.0 mmol/L   Acid-base deficit 24.8 (H) 0.0 - 2.0 mmol/L   O2 Saturation 50.8 %   Patient temperature 37.0    Collection site LINE    Sample type VENOUS     Comment: Performed at [facility], [address], [place], [place] [address]  Magnesium     Status: None   Collection Time: 03/30/20  3:04 AM  Result Value Ref Range   Magnesium 2.4 1.7 - 2.4 mg/dL    Comment: Performed at [facility], [address] [place], [place] [address]  Blood Culture (routine x 2)     Status: None   Collection Time: 03/30/20  3:05 AM   Specimen:  BLOOD  Result Value Ref Range   Specimen Description BLOOD BLOOD LEFT FOREARM    Special Requests      BOTTLES DRAWN AEROBIC AND ANAEROBIC Blood Culture results may not be optimal due to an inadequate volume of blood received in culture bottles   Culture      NO GROWTH 5 DAYS Performed at [facility], [address] [place], [place] [address]    Report Status 04/04/2020 FINAL   Respiratory Panel by RT PCR (Flu A&B, Covid) - Nasopharyngeal Swab     Status: None   Collection Time: 03/30/20  3:05 AM   Specimen: Nasopharyngeal Swab  Result Value Ref Range   SARS Coronavirus 2 by RT PCR NEGATIVE NEGATIVE    Comment: (NOTE) SARS-CoV-2 target nucleic acids are NOT DETECTED.  The SARS-CoV-2 RNA is generally detectable in upper respiratoy specimens during the acute phase of infection. The lowest concentration of SARS-CoV-2 viral copies this assay can detect is 131 copies/mL. A negative result does not preclude SARS-Cov-2 infection and should not be used as the sole basis for treatment or other patient management decisions. A negative result may occur with  improper specimen collection/handling, submission of specimen other than nasopharyngeal swab, presence of viral mutation(s) within the areas targeted by this assay, and inadequate number of viral copies (<131 copies/mL). A negative result must be combined with clinical observations, patient history, and epidemiological information. The expected result is Negative.  Fact Sheet for Patients:  PinkCheek.be  Fact Sheet for Healthcare Providers:  GravelBags.it  This test is no t yet approved or cleared by the [place] FDA and  has been authorized for detection and/or diagnosis of SARS-CoV-2 by FDA under an Emergency Use Authorization (EUA). This EUA will remain  in effect (meaning this  test can be used) for the duration of the COVID-19 declaration under Section  564(b)(1) of the Act, 21 U.S.C. section 360bbb-3(b)(1), unless the authorization is terminated or revoked sooner.     Influenza A by PCR NEGATIVE NEGATIVE   Influenza B by PCR NEGATIVE NEGATIVE    Comment: (NOTE) The Xpert Xpress SARS-CoV-2/FLU/RSV assay is intended as an aid in  the diagnosis of influenza from Nasopharyngeal swab specimens and  should not be used as a sole basis for treatment. Nasal washings and  aspirates are unacceptable for Xpert Xpress SARS-CoV-2/FLU/RSV  testing.  Fact Sheet for Patients: PinkCheek.be  Fact Sheet for Healthcare Providers: GravelBags.it  This test is not yet approved or cleared by the [place] FDA and  has been authorized for detection and/or diagnosis of SARS-CoV-2 by  FDA under an Emergency Use Authorization (EUA). This EUA will remain  in effect (meaning this test can be used) for the duration of the  Covid-19 declaration under Section 564(b)(1) of the Act, 21  U.S.C. section 360bbb-3(b)(1), unless the authorization is  terminated or revoked. Performed at [facility], [address] [place], [place] [address]   Basic metabolic panel     Status: Abnormal   Collection Time: 03/30/20  4:25 AM  Result Value Ref Range   Sodium 127 (L) 135 - 145 mmol/L    Comment: LYTES REPEATED / JAG   Potassium 4.7 3.5 - 5.1 mmol/L   Chloride 92 (L) 98 - 111 mmol/L   CO2 8 (L) 22 - 32 mmol/L   Glucose, Bld 787 (HH) 70 - 99 mg/dL    Comment: CRITICAL RESULT CALLED TO, READ BACK BY AND VERIFIED WITH REBEKAH BRAXTER ON 03/30/20 AT 0622 BY JAG Glucose reference range applies only to samples taken after fasting for at least 8 hours.    BUN 46 (H) 6 - 20 mg/dL   Creatinine, Ser 1.93 (H) 0.44 - 1.00 mg/dL   Calcium 8.9 8.9 - 10.3 mg/dL   GFR, Estimated 28 (L) >60 mL/min   Anion gap 27 (H) 5 - 15    Comment: Performed at [facility], [address] [place], [place]  [address]  Beta-hydroxybutyric acid     Status: Abnormal   Collection Time: 03/30/20  4:25 AM  Result Value Ref Range   Beta-Hydroxybutyric Acid >8.00 (H) 0.05 - 0.27 mmol/L    Comment: RESULT CONFIRMED BY MANUAL DILUTION / JAG Performed at [facility], [address] [place], [place] [address]   Procalcitonin - Baseline     Status: None   Collection Time: 03/30/20  4:25 AM  Result Value Ref Range   Procalcitonin 0.38 ng/mL    Comment:        Interpretation: PCT (Procalcitonin) <= 0.5 ng/mL: Systemic infection (sepsis) is not likely. Local bacterial infection is possible. (NOTE)       Sepsis PCT Algorithm           Lower Respiratory Tract                                      Infection PCT Algorithm    ----------------------------     ----------------------------         PCT < 0.25 ng/mL                PCT < 0.10 ng/mL          Strongly encourage  Strongly discourage   discontinuation of antibiotics    initiation of antibiotics    ----------------------------     -----------------------------       PCT 0.25 - 0.50 ng/mL            PCT 0.10 - 0.25 ng/mL               OR       >80% decrease in PCT            Discourage initiation of                                            antibiotics      Encourage discontinuation           of antibiotics    ----------------------------     -----------------------------         PCT >= 0.50 ng/mL              PCT 0.26 - 0.50 ng/mL               AND        <80% decrease in PCT             Encourage initiation of                                             antibiotics       Encourage continuation           of antibiotics    ----------------------------     -----------------------------        PCT >= 0.50 ng/mL                  PCT > 0.50 ng/mL               AND         increase in PCT                  Strongly encourage                                      initiation of antibiotics    Strongly encourage escalation            of antibiotics                                     -----------------------------                                           PCT <= 0.25 ng/mL                                                 OR                                        >  80% decrease in PCT                                      Discontinue / Do not initiate                                             antibiotics  Performed at [facility], [address] [place], [place] [address]   Magnesium     Status: None   Collection Time: 03/30/20  5:00 AM  Result Value Ref Range   Magnesium 2.3 1.7 - 2.4 mg/dL    Comment: Performed at [facility], [address] [place], [place] [address]  Phosphorus     Status: Abnormal   Collection Time: 03/30/20  5:00 AM  Result Value Ref Range   Phosphorus 5.8 (H) 2.5 - 4.6 mg/dL    Comment: Performed at [facility], [address] [place], [place] [address]  Glucose, capillary     Status: Abnormal   Collection Time: 03/30/20  6:02 AM  Result Value Ref Range   Glucose-Capillary >600 (HH) 70 - 99 mg/dL    Comment: Glucose reference range applies only to samples taken after fasting for at least 8 hours.  Glucose, capillary     Status: Abnormal   Collection Time: 03/30/20  7:11 AM  Result Value Ref Range   Glucose-Capillary >600 (HH) 70 - 99 mg/dL    Comment: Glucose reference range applies only to samples taken after fasting for at least 8 hours.  Glucose, capillary     Status: Abnormal   Collection Time: 03/30/20  7:43 AM  Result Value Ref Range   Glucose-Capillary >600 (HH) 70 - 99 mg/dL    Comment: Glucose reference range applies only to samples taken after fasting for at least 8 hours.  Glucose, capillary     Status: Abnormal   Collection Time: 03/30/20  8:16 AM  Result Value Ref Range   Glucose-Capillary >600 (HH) 70 - 99 mg/dL    Comment: Glucose reference range applies only to samples taken after fasting for at least 8 hours.  Glucose, capillary      Status: Abnormal   Collection Time: 03/30/20  8:52 AM  Result Value Ref Range   Glucose-Capillary 572 (HH) 70 - 99 mg/dL    Comment: Glucose reference range applies only to samples taken after fasting for at least 8 hours.   Comment 1 Document in Chart   Glucose, capillary     Status: Abnormal   Collection Time: 03/30/20  9:36 AM  Result Value Ref Range   Glucose-Capillary 518 (HH) 70 - 99 mg/dL    Comment: Glucose reference range applies only to samples taken after fasting for at least 8 hours.   Comment 1 Document in Chart   Glucose, capillary     Status: Abnormal   Collection Time: 03/30/20 10:58 AM  Result Value Ref Range   Glucose-Capillary 504 (HH) 70 - 99 mg/dL    Comment: Glucose reference range applies only to samples taken after fasting for at least 8 hours.   Comment 1 Notify RN   Glucose, capillary     Status: Abnormal   Collection Time: 03/30/20 12:32 PM  Result Value Ref Range   Glucose-Capillary 407 (H) 70 - 99 mg/dL  Comment: Glucose reference range applies only to samples taken after fasting for at least 8 hours.  Glucose, capillary     Status: Abnormal   Collection Time: 03/30/20  1:16 PM  Result Value Ref Range   Glucose-Capillary 362 (H) 70 - 99 mg/dL    Comment: Glucose reference range applies only to samples taken after fasting for at least 8 hours.  Glucose, capillary     Status: Abnormal   Collection Time: 03/30/20  2:21 PM  Result Value Ref Range   Glucose-Capillary 409 (H) 70 - 99 mg/dL    Comment: Glucose reference range applies only to samples taken after fasting for at least 8 hours.  Glucose, capillary     Status: Abnormal   Collection Time: 03/30/20  3:12 PM  Result Value Ref Range   Glucose-Capillary 302 (H) 70 - 99 mg/dL    Comment: Glucose reference range applies only to samples taken after fasting for at least 8 hours.  Basic metabolic panel     Status: Abnormal   Collection Time: 03/30/20  4:40 PM  Result Value Ref Range   Sodium 130  (L) 135 - 145 mmol/L   Potassium 3.6 3.5 - 5.1 mmol/L   Chloride 102 98 - 111 mmol/L   CO2 17 (L) 22 - 32 mmol/L   Glucose, Bld 245 (H) 70 - 99 mg/dL    Comment: Glucose reference range applies only to samples taken after fasting for at least 8 hours.   BUN 62 (H) 6 - 20 mg/dL   Creatinine, Ser 1.09 (H) 0.44 - 1.00 mg/dL   Calcium 9.3 8.9 - 10.3 mg/dL   GFR, Estimated 57 (L) >60 mL/min   Anion gap 11 5 - 15    Comment: Performed at [facility], [address] [place], [place] [address]  Beta-hydroxybutyric acid     Status: Abnormal   Collection Time: 03/30/20  4:40 PM  Result Value Ref Range   Beta-Hydroxybutyric Acid 0.37 (H) 0.05 - 0.27 mmol/L    Comment: Performed at [facility], [address] [place], [place] [address]  Lactic acid, plasma     Status: None   Collection Time: 03/30/20  4:43 PM  Result Value Ref Range   Lactic Acid, Venous 1.5 0.5 - 1.9 mmol/L    Comment: Performed at [facility], [address] [place], [place] [address]  HIV Antibody (routine testing w rflx)     Status: None   Collection Time: 03/30/20  4:43 PM  Result Value Ref Range   HIV Screen 4th Generation wRfx Non Reactive Non Reactive    Comment: Performed at [facility], [place] [address] [place], [place] [address]  Glucose, capillary     Status: Abnormal   Collection Time: 03/30/20  5:07 PM  Result Value Ref Range   Glucose-Capillary 249 (H) 70 - 99 mg/dL    Comment: Glucose reference range applies only to samples taken after fasting for at least 8 hours.  Glucose, capillary     Status: Abnormal   Collection Time: 03/30/20  5:53 PM  Result Value Ref Range   Glucose-Capillary 230 (H) 70 - 99 mg/dL    Comment: Glucose reference range applies only to samples taken after fasting for at least 8 hours.  Glucose, capillary     Status: Abnormal   Collection Time: 03/30/20  7:14 PM  Result Value Ref Range   Glucose-Capillary 219 (H) 70 - 99 mg/dL    Comment:  Glucose  reference range applies only to samples taken after fasting for at least 8 hours.  Glucose, capillary     Status: Abnormal   Collection Time: 03/30/20  8:19 PM  Result Value Ref Range   Glucose-Capillary 212 (H) 70 - 99 mg/dL    Comment: Glucose reference range applies only to samples taken after fasting for at least 8 hours.  Basic metabolic panel     Status: Abnormal   Collection Time: 03/30/20  8:21 PM  Result Value Ref Range   Sodium 133 (L) 135 - 145 mmol/L   Potassium 3.4 (L) 3.5 - 5.1 mmol/L   Chloride 104 98 - 111 mmol/L   CO2 18 (L) 22 - 32 mmol/L   Glucose, Bld 215 (H) 70 - 99 mg/dL    Comment: Glucose reference range applies only to samples taken after fasting for at least 8 hours.   BUN 59 (H) 6 - 20 mg/dL   Creatinine, Ser 1.11 (H) 0.44 - 1.00 mg/dL   Calcium 9.4 8.9 - 10.3 mg/dL   GFR, Estimated 55 (L) >60 mL/min   Anion gap 11 5 - 15    Comment: Performed at [facility], [address] [place], [place] [address]  Beta-hydroxybutyric acid     Status: Abnormal   Collection Time: 03/30/20  8:21 PM  Result Value Ref Range   Beta-Hydroxybutyric Acid 0.48 (H) 0.05 - 0.27 mmol/L    Comment: Performed at [facility], [address] [place], [place] [address]  Glucose, capillary     Status: Abnormal   Collection Time: 03/30/20  9:19 PM  Result Value Ref Range   Glucose-Capillary 206 (H) 70 - 99 mg/dL    Comment: Glucose reference range applies only to samples taken after fasting for at least 8 hours.  Glucose, capillary     Status: Abnormal   Collection Time: 03/30/20  9:43 PM  Result Value Ref Range   Glucose-Capillary 221 (H) 70 - 99 mg/dL    Comment: Glucose reference range applies only to samples taken after fasting for at least 8 hours.  Glucose, capillary     Status: Abnormal   Collection Time: 03/30/20 11:20 PM  Result Value Ref Range   Glucose-Capillary 258 (H) 70 - 99 mg/dL    Comment: Glucose reference range applies only to  samples taken after fasting for at least 8 hours.  Glucose, capillary     Status: Abnormal   Collection Time: 03/31/20  2:19 AM  Result Value Ref Range   Glucose-Capillary 287 (H) 70 - 99 mg/dL    Comment: Glucose reference range applies only to samples taken after fasting for at least 8 hours.  Basic metabolic panel     Status: Abnormal   Collection Time: 03/31/20  2:23 AM  Result Value Ref Range   Sodium 134 (L) 135 - 145 mmol/L   Potassium 4.5 3.5 - 5.1 mmol/L   Chloride 108 98 - 111 mmol/L   CO2 13 (L) 22 - 32 mmol/L   Glucose, Bld 284 (H) 70 - 99 mg/dL    Comment: Glucose reference range applies only to samples taken after fasting for at least 8 hours.   BUN 48 (H) 6 - 20 mg/dL   Creatinine, Ser 1.12 (H) 0.44 - 1.00 mg/dL   Calcium 8.8 (L) 8.9 - 10.3 mg/dL   GFR, Estimated 55 (L) >60 mL/min   Anion gap 13 5 - 15    Comment: Performed at [facility], [address] [place], [place] [address]  CBC with Differential/Platelet     Status: Abnormal   Collection Time: 03/31/20  2:23 AM  Result Value Ref Range   WBC 20.8 (H) 4.0 - 10.5 K/uL   RBC 4.76 3.87 - 5.11 MIL/uL   Hemoglobin 15.2 (H) 12.0 - 15.0 g/dL   HCT 42.8 36 - 46 %   MCV 89.9 80.0 - 100.0 fL   MCH 31.9 26.0 - 34.0 pg   MCHC 35.5 30.0 - 36.0 g/dL   RDW 13.2 11.5 - 15.5 %   Platelets 214 150 - 400 K/uL   nRBC 0.0 0.0 - 0.2 %   Neutrophils Relative % 88 %   Neutro Abs 18.5 (H) 1.7 - 7.7 K/uL   Lymphocytes Relative 5 %   Lymphs Abs 1.0 0.7 - 4.0 K/uL   Monocytes Relative 6 %   Monocytes Absolute 1.2 (H) 0.1 - 1.0 K/uL   Eosinophils Relative 0 %   Eosinophils Absolute 0.0 0.0 - 0.5 K/uL   Basophils Relative 0 %   Basophils Absolute 0.0 0.0 - 0.1 K/uL   Immature Granulocytes 1 %   Abs Immature Granulocytes 0.11 (H) 0.00 - 0.07 K/uL    Comment: Performed at [facility], [address], [place], [place] [address]  Magnesium     Status: None   Collection Time: 03/31/20  2:23 AM  Result Value  Ref Range   Magnesium 2.1 1.7 - 2.4 mg/dL    Comment: Performed at [facility], [address], [place], [place] [address]  Phosphorus     Status: Abnormal   Collection Time: 03/31/20  2:23 AM  Result Value Ref Range   Phosphorus 1.7 (L) 2.5 - 4.6 mg/dL    Comment: Performed at [facility], [address] [place], [place] [address]  Hemoglobin A1c     Status: Abnormal   Collection Time: 03/31/20  2:23 AM  Result Value Ref Range   Hgb A1c MFr Bld 14.5 (H) 4.8 - 5.6 %    Comment: (NOTE) Pre diabetes:          5.7%-6.4%  Diabetes:              >6.4%  Glycemic control for   <7.0% adults with diabetes    Mean Plasma Glucose 369.45 mg/dL    Comment: Performed at [facility], [address] [place], [place] [address]  Glucose, capillary     Status: Abnormal   Collection Time: 03/31/20  5:44 AM  Result Value Ref Range   Glucose-Capillary 341 (H) 70 - 99 mg/dL    Comment: Glucose reference range applies only to samples taken after fasting for at least 8 hours.  Glucose, capillary     Status: Abnormal   Collection Time: 03/31/20  8:21 AM  Result Value Ref Range   Glucose-Capillary 347 (H) 70 - 99 mg/dL    Comment: Glucose reference range applies only to samples taken after fasting for at least 8 hours.  Basic metabolic panel     Status: Abnormal   Collection Time: 03/31/20  9:29 AM  Result Value Ref Range   Sodium 133 (L) 135 - 145 mmol/L   Potassium 4.4 3.5 - 5.1 mmol/L    Comment: HEMOLYSIS AT THIS LEVEL MAY AFFECT RESULT   Chloride 107 98 - 111 mmol/L   CO2 11 (L) 22 - 32 mmol/L   Glucose, Bld 367 (H) 70 - 99 mg/dL    Comment: Glucose reference range applies  only to samples taken after fasting for at least 8 hours.   BUN 40 (H) 6 - 20 mg/dL   Creatinine, Ser 1.07 (H) 0.44 - 1.00 mg/dL   Calcium 8.7 (L) 8.9 - 10.3 mg/dL   GFR, Estimated 58 (L) >60 mL/min   Anion gap 15 5 - 15    Comment: Performed at [facility], [address]  [place], [place] [address]  Glucose, capillary     Status: Abnormal   Collection Time: 03/31/20 12:04 PM  Result Value Ref Range   Glucose-Capillary 290 (H) 70 - 99 mg/dL    Comment: Glucose reference range applies only to samples taken after fasting for at least 8 hours.  Glucose, capillary     Status: Abnormal   Collection Time: 03/31/20  4:38 PM  Result Value Ref Range   Glucose-Capillary 147 (H) 70 - 99 mg/dL    Comment: Glucose reference range applies only to samples taken after fasting for at least 8 hours.  Glucose, capillary     Status: None   Collection Time: 03/31/20 10:30 PM  Result Value Ref Range   Glucose-Capillary 90 70 - 99 mg/dL    Comment: Glucose reference range applies only to samples taken after fasting for at least 8 hours.  Basic metabolic panel     Status: Abnormal   Collection Time: 04/01/20  6:06 AM  Result Value Ref Range   Sodium 138 135 - 145 mmol/L   Potassium 3.1 (L) 3.5 - 5.1 mmol/L   Chloride 112 (H) 98 - 111 mmol/L   CO2 20 (L) 22 - 32 mmol/L   Glucose, Bld 139 (H) 70 - 99 mg/dL    Comment: Glucose reference range applies only to samples taken after fasting for at least 8 hours.   BUN 34 (H) 6 - 20 mg/dL   Creatinine, Ser 0.63 0.44 - 1.00 mg/dL   Calcium 8.7 (L) 8.9 - 10.3 mg/dL   GFR, Estimated >60 >60 mL/min   Anion gap 6 5 - 15    Comment: Performed at [facility], [address] [place], [place] [address]  Magnesium     Status: Abnormal   Collection Time: 04/01/20  6:06 AM  Result Value Ref Range   Magnesium 2.5 (H) 1.7 - 2.4 mg/dL    Comment: Performed at [facility], [address] [place], [place] [address]  Phosphorus     Status: Abnormal   Collection Time: 04/01/20  6:06 AM  Result Value Ref Range   Phosphorus 2.0 (L) 2.5 - 4.6 mg/dL    Comment: Performed at [facility], [address] [place], [place] [address]  CBC     Status: Abnormal   Collection Time: 04/01/20  6:06 AM  Result Value Ref Range    WBC 11.0 (H) 4.0 - 10.5 K/uL   RBC 4.39 3.87 - 5.11 MIL/uL   Hemoglobin 14.1 12.0 - 15.0 g/dL   HCT 40.1 36 - 46 %   MCV 91.3 80.0 - 100.0 fL   MCH 32.1 26.0 - 34.0 pg   MCHC 35.2 30.0 - 36.0 g/dL   RDW 14.2 11.5 - 15.5 %   Platelets 170 150 - 400 K/uL   nRBC 0.0 0.0 - 0.2 %    Comment: Performed at [facility], [address], [place], [place] [address]  Hepatic function panel     Status: Abnormal   Collection Time: 04/01/20  6:06 AM  Result Value Ref Range  Total Protein 6.3 (L) 6.5 - 8.1 g/dL   Albumin 2.8 (L) 3.5 - 5.0 g/dL   AST 8 (L) 15 - 41 U/L   ALT <5 0 - 44 U/L   Alkaline Phosphatase 57 38 - 126 U/L   Total Bilirubin 0.6 0.3 - 1.2 mg/dL   Bilirubin, Direct <0.1 0.0 - 0.2 mg/dL   Indirect Bilirubin NOT CALCULATED 0.3 - 0.9 mg/dL    Comment: Performed at [facility], [address] [place], [place] [address]  Procalcitonin     Status: None   Collection Time: 04/01/20  6:06 AM  Result Value Ref Range   Procalcitonin 0.41 ng/mL    Comment:        Interpretation: PCT (Procalcitonin) <= 0.5 ng/mL: Systemic infection (sepsis) is not likely. Local bacterial infection is possible. (NOTE)       Sepsis PCT Algorithm           Lower Respiratory Tract                                      Infection PCT Algorithm    ----------------------------     ----------------------------         PCT < 0.25 ng/mL                PCT < 0.10 ng/mL          Strongly encourage             Strongly discourage   discontinuation of antibiotics    initiation of antibiotics    ----------------------------     -----------------------------       PCT 0.25 - 0.50 ng/mL            PCT 0.10 - 0.25 ng/mL               OR       >80% decrease in PCT            Discourage initiation of                                            antibiotics      Encourage discontinuation           of antibiotics    ----------------------------     -----------------------------         PCT >= 0.50  ng/mL              PCT 0.26 - 0.50 ng/mL               AND        <80% decrease in PCT             Encourage initiation of                                             antibiotics       Encourage continuation           of antibiotics    ----------------------------     -----------------------------        PCT >= 0.50 ng/mL  PCT > 0.50 ng/mL               AND         increase in PCT                  Strongly encourage                                      initiation of antibiotics    Strongly encourage escalation           of antibiotics                                     -----------------------------                                           PCT <= 0.25 ng/mL                                                 OR                                        > 80% decrease in PCT                                      Discontinue / Do not initiate                                             antibiotics  Performed at [facility], [address] [place], [place] [address]   Glucose, capillary     Status: Abnormal   Collection Time: 04/01/20  8:21 AM  Result Value Ref Range   Glucose-Capillary 133 (H) 70 - 99 mg/dL    Comment: Glucose reference range applies only to samples taken after fasting for at least 8 hours.   Comment 1 Notify RN   Glucose, capillary     Status: Abnormal   Collection Time: 04/01/20 12:03 PM  Result Value Ref Range   Glucose-Capillary 124 (H) 70 - 99 mg/dL    Comment: Glucose reference range applies only to samples taken after fasting for at least 8 hours.   Comment 1 Notify RN   Glucose, capillary     Status: Abnormal   Collection Time: 04/01/20  4:37 PM  Result Value Ref Range   Glucose-Capillary 27 (LL) 70 - 99 mg/dL    Comment: Glucose reference range applies only to samples taken after fasting for at least 8 hours. REPEATED TO VERIFY, CHARGE CREDITED Performed at [facility], [address] [place], [place] [address]     Comment 1 Notify RN   Glucose, capillary     Status: Abnormal   Collection Time: 04/01/20  4:39 PM  Result Value Ref Range   Glucose-Capillary 30 (LL)  70 - 99 mg/dL    Comment: Glucose reference range applies only to samples taken after fasting for at least 8 hours.   Comment 1 Notify RN   Glucose, capillary     Status: Abnormal   Collection Time: 04/01/20  4:44 PM  Result Value Ref Range   Glucose-Capillary 28 (LL) 70 - 99 mg/dL    Comment: Glucose reference range applies only to samples taken after fasting for at least 8 hours.   Comment 1 Notify RN   Glucose, capillary     Status: Abnormal   Collection Time: 04/01/20  5:40 PM  Result Value Ref Range   Glucose-Capillary 214 (H) 70 - 99 mg/dL    Comment: Glucose reference range applies only to samples taken after fasting for at least 8 hours.  Glucose, capillary     Status: Abnormal   Collection Time: 04/01/20  8:57 PM  Result Value Ref Range   Glucose-Capillary 172 (H) 70 - 99 mg/dL    Comment: Glucose reference range applies only to samples taken after fasting for at least 8 hours.  Basic metabolic panel     Status: Abnormal   Collection Time: 04/02/20  4:48 AM  Result Value Ref Range   Sodium 140 135 - 145 mmol/L   Potassium 3.7 3.5 - 5.1 mmol/L   Chloride 113 (H) 98 - 111 mmol/L   CO2 21 (L) 22 - 32 mmol/L   Glucose, Bld 94 70 - 99 mg/dL    Comment: Glucose reference range applies only to samples taken after fasting for at least 8 hours.   BUN 16 6 - 20 mg/dL   Creatinine, Ser 0.48 0.44 - 1.00 mg/dL   Calcium 8.7 (L) 8.9 - 10.3 mg/dL   GFR, Estimated >60 >60 mL/min   Anion gap 6 5 - 15    Comment: Performed at [facility], [address] [place], [place] [address]  Procalcitonin     Status: None   Collection Time: 04/02/20  4:48 AM  Result Value Ref Range   Procalcitonin <0.10 ng/mL    Comment:        Interpretation: PCT (Procalcitonin) <= 0.5 ng/mL: Systemic infection (sepsis) is not likely. Local  bacterial infection is possible. (NOTE)       Sepsis PCT Algorithm           Lower Respiratory Tract                                      Infection PCT Algorithm    ----------------------------     ----------------------------         PCT < 0.25 ng/mL                PCT < 0.10 ng/mL          Strongly encourage             Strongly discourage   discontinuation of antibiotics    initiation of antibiotics    ----------------------------     -----------------------------       PCT 0.25 - 0.50 ng/mL            PCT 0.10 - 0.25 ng/mL               OR       >80% decrease in PCT            Discourage initiation of  antibiotics      Encourage discontinuation           of antibiotics    ----------------------------     -----------------------------         PCT >= 0.50 ng/mL              PCT 0.26 - 0.50 ng/mL               AND        <80% decrease in PCT             Encourage initiation of                                             antibiotics       Encourage continuation           of antibiotics    ----------------------------     -----------------------------        PCT >= 0.50 ng/mL                  PCT > 0.50 ng/mL               AND         increase in PCT                  Strongly encourage                                      initiation of antibiotics    Strongly encourage escalation           of antibiotics                                     -----------------------------                                           PCT <= 0.25 ng/mL                                                 OR                                        > 80% decrease in PCT                                      Discontinue / Do not initiate                                             antibiotics  Performed at [facility], [address] [place], [place] [address]   Magnesium     Status: None   Collection Time: 04/02/20  4:48 AM  Result Value Ref Range    Magnesium 2.2 1.7 - 2.4 mg/dL    Comment: Performed at [facility], [address] [place], [place] [address]  Phosphorus     Status: Abnormal   Collection Time: 04/02/20  4:48 AM  Result Value Ref Range   Phosphorus 2.1 (L) 2.5 - 4.6 mg/dL    Comment: Performed at [facility], [address] [place], [place] [address]  Glucose, capillary     Status: None   Collection Time: 04/02/20  7:34 AM  Result Value Ref Range   Glucose-Capillary 71 70 - 99 mg/dL    Comment: Glucose reference range applies only to samples taken after fasting for at least 8 hours.   Comment 1 Notify RN   Glucose, capillary     Status: None   Collection Time: 04/02/20 11:44 AM  Result Value Ref Range   Glucose-Capillary 97 70 - 99 mg/dL    Comment: Glucose reference range applies only to samples taken after fasting for at least 8 hours.   Comment 1 Notify RN   Glucose, capillary     Status: Abnormal   Collection Time: 04/02/20  2:38 PM  Result Value Ref Range   Glucose-Capillary 68 (L) 70 - 99 mg/dL    Comment: Glucose reference range applies only to samples taken after fasting for at least 8 hours.   Comment 1 Notify RN   Glucose, capillary     Status: None   Collection Time: 04/02/20  5:04 PM  Result Value Ref Range   Glucose-Capillary 79 70 - 99 mg/dL    Comment: Glucose reference range applies only to samples taken after fasting for at least 8 hours.   Comment 1 Notify RN   Glucose, capillary     Status: Abnormal   Collection Time: 04/02/20  8:44 PM  Result Value Ref Range   Glucose-Capillary 52 (L) 70 - 99 mg/dL    Comment: Glucose reference range applies only to samples taken after fasting for at least 8 hours.  Glucose, capillary     Status: Abnormal   Collection Time: 04/02/20  9:03 PM  Result Value Ref Range   Glucose-Capillary 53 (L) 70 - 99 mg/dL    Comment: Glucose reference range applies only to samples taken after fasting for at least 8 hours.   Comment 1 Document in  Chart   Glucose, capillary     Status: Abnormal   Collection Time: 04/02/20  9:22 PM  Result Value Ref Range   Glucose-Capillary 64 (L) 70 - 99 mg/dL    Comment: Glucose reference range applies only to samples taken after fasting for at least 8 hours.   Comment 1 Document in Chart   Glucose, capillary     Status: None   Collection Time: 04/02/20  9:40 PM  Result Value Ref Range   Glucose-Capillary 97 70 - 99 mg/dL    Comment: Glucose reference range applies only to samples taken after fasting for at least 8 hours.  Basic metabolic panel     Status: Abnormal   Collection Time: 04/03/20  3:17 AM  Result Value Ref Range   Sodium 140 135 - 145 mmol/L   Potassium 2.5 (LL) 3.5 - 5.1 mmol/L    Comment: CRITICAL RESULT CALLED TO, READ BACK BY AND VERIFIED WITH BRIANNA CLEMONS@ 0459 04/03/20 RH    Chloride 108 98 - 111 mmol/L   CO2 25 22 - 32 mmol/L   Glucose, Bld 99 70 -  99 mg/dL    Comment: Glucose reference range applies only to samples taken after fasting for at least 8 hours.   BUN 8 6 - 20 mg/dL   Creatinine, Ser 0.40 (L) 0.44 - 1.00 mg/dL   Calcium 8.2 (L) 8.9 - 10.3 mg/dL   GFR, Estimated >60 >60 mL/min   Anion gap 7 5 - 15    Comment: Performed at [facility], [address] [place], [place] [address]  Magnesium     Status: None   Collection Time: 04/03/20  3:17 AM  Result Value Ref Range   Magnesium 2.1 1.7 - 2.4 mg/dL    Comment: Performed at [facility], [address] [place], [place] [address]  Phosphorus     Status: None   Collection Time: 04/03/20  3:17 AM  Result Value Ref Range   Phosphorus 3.3 2.5 - 4.6 mg/dL    Comment: Performed at [facility], [address] [place], [place] [address]  CBC     Status: Abnormal   Collection Time: 04/03/20  3:17 AM  Result Value Ref Range   WBC 8.8 4.0 - 10.5 K/uL   RBC 3.66 (L) 3.87 - 5.11 MIL/uL   Hemoglobin 11.7 (L) 12.0 - 15.0 g/dL   HCT 33.8 (L) 36 - 46 %   MCV 92.3 80.0 - 100.0 fL    MCH 32.0 26.0 - 34.0 pg   MCHC 34.6 30.0 - 36.0 g/dL   RDW 13.6 11.5 - 15.5 %   Platelets 148 (L) 150 - 400 K/uL   nRBC 0.0 0.0 - 0.2 %    Comment: Performed at [facility], [address] [place], [place] [address]  Glucose, capillary     Status: None   Collection Time: 04/03/20  8:02 AM  Result Value Ref Range   Glucose-Capillary 98 70 - 99 mg/dL    Comment: Glucose reference range applies only to samples taken after fasting for at least 8 hours.  Glucose, capillary     Status: Abnormal   Collection Time: 04/03/20 11:52 AM  Result Value Ref Range   Glucose-Capillary 167 (H) 70 - 99 mg/dL    Comment: Glucose reference range applies only to samples taken after fasting for at least 8 hours.   Comment 1 Notify RN   Potassium     Status: None   Collection Time: 04/03/20 12:49 PM  Result Value Ref Range   Potassium 3.5 3.5 - 5.1 mmol/L    Comment: Performed at [facility], [address] [place], [place] [address]  Glucose, capillary     Status: None   Collection Time: 04/03/20  4:32 PM  Result Value Ref Range   Glucose-Capillary 99 70 - 99 mg/dL    Comment: Glucose reference range applies only to samples taken after fasting for at least 8 hours.   Comment 1 Notify RN   Glucose, capillary     Status: Abnormal   Collection Time: 04/03/20  9:46 PM  Result Value Ref Range   Glucose-Capillary 128 (H) 70 - 99 mg/dL    Comment: Glucose reference range applies only to samples taken after fasting for at least 8 hours.   Comment 1 Notify RN   Basic metabolic panel     Status: Abnormal   Collection Time: 04/04/20  5:19 AM  Result Value Ref Range   Sodium 140 135 - 145 mmol/L   Potassium 4.2 3.5 - 5.1 mmol/L   Chloride 105 98 - 111 mmol/L  CO2 28 22 - 32 mmol/L   Glucose, Bld 154 (H) 70 - 99 mg/dL    Comment: Glucose reference range applies only to samples taken after fasting for at least 8 hours.   BUN 9 6 - 20 mg/dL   Creatinine, Ser 0.57 0.44 - 1.00 mg/dL    Calcium 8.5 (L) 8.9 - 10.3 mg/dL   GFR, Estimated >60 >60 mL/min   Anion gap 7 5 - 15    Comment: Performed at [facility], [address] [place], [place] [address]  CBC     Status: Abnormal   Collection Time: 04/04/20  5:19 AM  Result Value Ref Range   WBC 10.6 (H) 4.0 - 10.5 K/uL   RBC 3.88 3.87 - 5.11 MIL/uL   Hemoglobin 12.3 12.0 - 15.0 g/dL   HCT 36.1 36 - 46 %   MCV 93.0 80.0 - 100.0 fL   MCH 31.7 26.0 - 34.0 pg   MCHC 34.1 30.0 - 36.0 g/dL   RDW 14.0 11.5 - 15.5 %   Platelets 160 150 - 400 K/uL   nRBC 0.0 0.0 - 0.2 %    Comment: Performed at [facility], [address], [place], [place] [address]  Magnesium     Status: None   Collection Time: 04/04/20  5:19 AM  Result Value Ref Range   Magnesium 2.1 1.7 - 2.4 mg/dL    Comment: Performed at [facility], [address] [place], [place] [address]  Phosphorus     Status: Abnormal   Collection Time: 04/04/20  5:19 AM  Result Value Ref Range   Phosphorus 2.1 (L) 2.5 - 4.6 mg/dL    Comment: Performed at [facility], [address] [place], [place] [address]  Glucose, capillary     Status: Abnormal   Collection Time: 04/04/20  7:28 AM  Result Value Ref Range   Glucose-Capillary 149 (H) 70 - 99 mg/dL    Comment: Glucose reference range applies only to samples taken after fasting for at least 8 hours.  Glucose, capillary     Status: Abnormal   Collection Time: 04/04/20 11:54 AM  Result Value Ref Range   Glucose-Capillary 114 (H) 70 - 99 mg/dL    Comment: Glucose reference range applies only to samples taken after fasting for at least 8 hours.  Cortisol-am, blood     Status: None   Collection Time: 04/04/20 12:38 PM  Result Value Ref Range   Cortisol - AM 12.7 6.7 - 22.6 ug/dL    Comment: Performed at [facility], [address] [place], [place] [address]  Glucose, capillary     Status: Abnormal   Collection Time: 04/04/20  4:26 PM  Result Value Ref Range    Glucose-Capillary 168 (H) 70 - 99 mg/dL    Comment: Glucose reference range applies only to samples taken after fasting for at least 8 hours.  Glucose, capillary     Status: Abnormal   Collection Time: 04/04/20  9:24 PM  Result Value Ref Range   Glucose-Capillary 162 (H) 70 - 99 mg/dL    Comment: Glucose reference range applies only to samples taken after fasting for at least 8 hours.  CBC     Status: Abnormal   Collection Time: 04/05/20  2:59 AM  Result Value Ref Range   WBC 9.3 4.0 - 10.5 K/uL   RBC 3.63 (L) 3.87 - 5.11 MIL/uL   Hemoglobin 11.5 (L) 12.0 - 15.0 g/dL   HCT  34.1 (L) 36 - 46 %   MCV 93.9 80.0 - 100.0 fL   MCH 31.7 26.0 - 34.0 pg   MCHC 33.7 30.0 - 36.0 g/dL   RDW 13.9 11.5 - 15.5 %   Platelets 199 150 - 400 K/uL   nRBC 0.0 0.0 - 0.2 %    Comment: Performed at [facility], [address] [place], [place] [address]  Renal function panel     Status: Abnormal   Collection Time: 04/05/20  2:59 AM  Result Value Ref Range   Sodium 136 135 - 145 mmol/L   Potassium 3.3 (L) 3.5 - 5.1 mmol/L   Chloride 102 98 - 111 mmol/L   CO2 24 22 - 32 mmol/L   Glucose, Bld 247 (H) 70 - 99 mg/dL    Comment: Glucose reference range applies only to samples taken after fasting for at least 8 hours.   BUN 7 6 - 20 mg/dL   Creatinine, Ser 0.58 0.44 - 1.00 mg/dL   Calcium 8.1 (L) 8.9 - 10.3 mg/dL   Phosphorus 2.3 (L) 2.5 - 4.6 mg/dL   Albumin 2.2 (L) 3.5 - 5.0 g/dL   GFR, Estimated >60 >60 mL/min   Anion gap 10 5 - 15    Comment: Performed at [facility], [address] [place], [place] [address]  Magnesium     Status: None   Collection Time: 04/05/20  2:59 AM  Result Value Ref Range   Magnesium 1.8 1.7 - 2.4 mg/dL    Comment: Performed at [facility], [address] [place], [place] [address]  Basic metabolic panel     Status: Abnormal   Collection Time: 04/05/20  2:59 AM  Result Value Ref Range   Sodium 136 135 - 145 mmol/L   Potassium 3.4 (L) 3.5  - 5.1 mmol/L   Chloride 102 98 - 111 mmol/L   CO2 25 22 - 32 mmol/L   Glucose, Bld 247 (H) 70 - 99 mg/dL    Comment: Glucose reference range applies only to samples taken after fasting for at least 8 hours.   BUN 7 6 - 20 mg/dL   Creatinine, Ser 0.59 0.44 - 1.00 mg/dL   Calcium 8.2 (L) 8.9 - 10.3 mg/dL   GFR, Estimated >60 >60 mL/min   Anion gap 9 5 - 15    Comment: Performed at [facility], [address] [place], [place] [address]  Glucose, capillary     Status: Abnormal   Collection Time: 04/05/20  7:27 AM  Result Value Ref Range   Glucose-Capillary 233 (H) 70 - 99 mg/dL    Comment: Glucose reference range applies only to samples taken after fasting for at least 8 hours.  Glucose, capillary     Status: Abnormal   Collection Time: 04/05/20 11:35 AM  Result Value Ref Range   Glucose-Capillary 206 (H) 70 - 99 mg/dL    Comment: Glucose reference range applies only to samples taken after fasting for at least 8 hours.   Comment 1 Notify RN     -------------------------------------------------------------------------- A&P:  Problem List Items Addressed This Visit      Digestive   GERD (gastroesophageal reflux disease) - Primary (Chronic)    Uncontrolled, has been taking double the dose of her esomeprazole, discussed taking $RemoveBeforeDE'40mg'[id number]$  only daily and adding in an H2 blocker to help with her GERD symptoms.  Will start on famotidine $RemoveBefor'20mg'OUpqAmrRWwWC$  BID to help with GERD in addition to esomeprazole.  Discussed if having  continued symptoms once taking this medication, will refer to GI for evaluation.      Relevant Medications   ondansetron (ZOFRAN-ODT) 4 MG disintegrating tablet   esomeprazole (NEXIUM) 40 MG capsule   famotidine (PEPCID) 20 MG tablet      Meds ordered this encounter  Medications  . esomeprazole (NEXIUM) 40 MG capsule    Sig: Take 1 capsule (40 mg total) by mouth daily before breakfast.    Dispense:  90 capsule    Refill:  1  . famotidine (PEPCID) 20 MG tablet    Sig:  Take 1 tablet (20 mg total) by mouth 2 (two) times daily.    Dispense:  180 tablet    Refill:  1    Follow-up: - Return if symptoms worsen or fail to improve with current treatment plan  Patient verbalizes understanding with the above medical recommendations including the limitation of remote medical advice.  Specific follow-up and call-back criteria were given for patient to follow-up or seek medical care more urgently if needed.  - Time spent in direct consultation with patient on phone: 6 minutes  Harlin Rain, [facility] 04/17/2020, 3:30 PM

## 2020-05-20 ENCOUNTER — Ambulatory Visit: Payer: Medicare Other | Admitting: Family Medicine

## 2020-09-18 ENCOUNTER — Emergency Department
Admission: EM | Admit: 2020-09-18 | Discharge: 2020-09-18 | Disposition: A | Discharge status: Home / Self Care | Payer: Medicare Other | Attending: Emergency Medicine | Admitting: Emergency Medicine

## 2020-09-18 ENCOUNTER — Other Ambulatory Visit: Payer: Self-pay

## 2020-09-18 ENCOUNTER — Emergency Department: Payer: Medicare Other

## 2020-09-18 DIAGNOSIS — Z7982 Long term (current) use of aspirin: Secondary | ICD-10-CM | POA: Insufficient documentation

## 2020-09-18 DIAGNOSIS — M25512 Pain in left shoulder: Secondary | ICD-10-CM | POA: Diagnosis present

## 2020-09-18 DIAGNOSIS — R079 Chest pain, unspecified: Secondary | ICD-10-CM | POA: Diagnosis not present

## 2020-09-18 DIAGNOSIS — E1165 Type 2 diabetes mellitus with hyperglycemia: Secondary | ICD-10-CM | POA: Insufficient documentation

## 2020-09-18 DIAGNOSIS — J449 Chronic obstructive pulmonary disease, unspecified: Secondary | ICD-10-CM | POA: Diagnosis not present

## 2020-09-18 DIAGNOSIS — Z8544 Personal history of malignant neoplasm of other female genital organs: Secondary | ICD-10-CM | POA: Diagnosis not present

## 2020-09-18 DIAGNOSIS — M7552 Bursitis of left shoulder: Secondary | ICD-10-CM | POA: Insufficient documentation

## 2020-09-18 DIAGNOSIS — Z951 Presence of aortocoronary bypass graft: Secondary | ICD-10-CM | POA: Diagnosis not present

## 2020-09-18 DIAGNOSIS — I5042 Chronic combined systolic (congestive) and diastolic (congestive) heart failure: Secondary | ICD-10-CM | POA: Diagnosis not present

## 2020-09-18 DIAGNOSIS — Z794 Long term (current) use of insulin: Secondary | ICD-10-CM | POA: Insufficient documentation

## 2020-09-18 DIAGNOSIS — I2510 Atherosclerotic heart disease of native coronary artery without angina pectoris: Secondary | ICD-10-CM | POA: Insufficient documentation

## 2020-09-18 DIAGNOSIS — R739 Hyperglycemia, unspecified: Secondary | ICD-10-CM

## 2020-09-18 DIAGNOSIS — E1110 Type 2 diabetes mellitus with ketoacidosis without coma: Secondary | ICD-10-CM | POA: Insufficient documentation

## 2020-09-18 DIAGNOSIS — Z87891 Personal history of nicotine dependence: Secondary | ICD-10-CM | POA: Insufficient documentation

## 2020-09-18 LAB — CBC
HCT: 40.3 % (ref 36.0–46.0)
Hemoglobin: 13.3 g/dL (ref 12.0–15.0)
MCH: 31.7 pg (ref 26.0–34.0)
MCHC: 33 g/dL (ref 30.0–36.0)
MCV: 96 fL (ref 80.0–100.0)
Platelets: 270 10*3/uL (ref 150–400)
RBC: 4.2 MIL/uL (ref 3.87–5.11)
RDW: 13.4 % (ref 11.5–15.5)
WBC: 8.6 10*3/uL (ref 4.0–10.5)
nRBC: 0 % (ref 0.0–0.2)

## 2020-09-18 LAB — COMPREHENSIVE METABOLIC PANEL
ALT: 21 U/L (ref 0–44)
AST: 41 U/L (ref 15–41)
Albumin: 3.5 g/dL (ref 3.5–5.0)
Alkaline Phosphatase: 108 U/L (ref 38–126)
Anion gap: 9 (ref 5–15)
BUN: 11 mg/dL (ref 6–20)
CO2: 24 mmol/L (ref 22–32)
Calcium: 9.3 mg/dL (ref 8.9–10.3)
Chloride: 102 mmol/L (ref 98–111)
Creatinine, Ser: 0.51 mg/dL (ref 0.44–1.00)
GFR, Estimated: >60 mL/min (ref >60)
Glucose, Bld: 329 mg/dL — ABNORMAL HIGH (ref 70–99)
Potassium: 4.3 mmol/L (ref 3.5–5.1)
Sodium: 135 mmol/L (ref 135–145)
Total Bilirubin: 0.4 mg/dL (ref 0.3–1.2)
Total Protein: 7.1 g/dL (ref 6.5–8.1)

## 2020-09-18 LAB — URINALYSIS, COMPLETE (UACMP) WITH MICROSCOPIC
Bacteria, UA: NONE SEEN
Bilirubin Urine: NEGATIVE
Glucose, UA: >=500 mg/dL — AB
Hgb urine dipstick: NEGATIVE
Ketones, ur: NEGATIVE mg/dL
Leukocytes,Ua: NEGATIVE
Nitrite: NEGATIVE
Protein, ur: NEGATIVE mg/dL
Specific Gravity, Urine: 1.012 (ref 1.005–1.030)
pH: 5 (ref 5.0–8.0)

## 2020-09-18 LAB — TROPONIN I (HIGH SENSITIVITY)
Troponin I (High Sensitivity): 5 ng/L (ref <18)
Troponin I (High Sensitivity): 5 ng/L (ref <18)

## 2020-09-18 LAB — LIPASE, BLOOD: Lipase: 20 U/L (ref 11–51)

## 2020-09-18 MED ORDER — OXYCODONE-ACETAMINOPHEN 5-325 MG PO TABS
1.0000 | ORAL_TABLET | Freq: Once | ORAL | Status: AC
  Administered 2020-09-18: 1 via ORAL
  Filled 2020-09-18: qty 1

## 2020-09-18 MED ORDER — OXYCODONE-ACETAMINOPHEN 5-325 MG PO TABS: 1.0000 | ORAL_TABLET | ORAL | 0 refills | Status: DC | PRN

## 2020-09-18 MED ORDER — ONDANSETRON HCL 4 MG/2ML IJ SOLN
4.0000 mg | Freq: Once | INTRAMUSCULAR | Status: AC
  Administered 2020-09-18: 4 mg via INTRAVENOUS
  Filled 2020-09-18: qty 2

## 2020-09-18 MED ORDER — SODIUM CHLORIDE 0.9 % IV BOLUS
1000.0000 mL | Freq: Once | INTRAVENOUS | Status: AC
  Administered 2020-09-18: 1000 mL via INTRAVENOUS

## 2020-09-18 NOTE — ED Notes (Signed)
Pending IV access for med admin. This RN attempted x2, another RN attempted x1

## 2020-09-18 NOTE — ED Notes (Signed)
Pt reports chest pain starting 1-2 hours ago, L sided, radiates to L shoulder. Pt reports "pressure and pain". +n/v. Reports she took one baby ASA and 1 nitro at home with no relief. States she is a palliative care pt at [facility] for bone marrow cancer. AAO NAD VSS

## 2020-09-18 NOTE — ED Triage Notes (Signed)
Pt in by [facility] with co chest pain times x2 hrs. Also co n.v hx of STEMI in 2019. CBG per EMS 278, states chest pain tender on palpation.

## 2020-09-18 NOTE — ED Provider Notes (Signed)
[facility] Emergency Department Provider Note   ____________________________________________   Event Date/Time   First MD Initiated Contact with Patient 09/18/20 0423     (approximate)  I have reviewed the triage vital signs and the nursing notes.   HISTORY  Chief Complaint Chest Pain    HPI Renee Mack is a 55 y.o. female brought to the ED via EMS from home with a chief complaint of left shoulder pain.  Patient with a history of CAD, COPD, ischemic cardiomyopathy, pancreatitis who awoke approximately 2 hours ago with sharp pain in her left shoulder which is exacerbated by movement.  Still reports nausea, vomiting and diarrhea.  Denies fever, cough, shortness of breath, abdominal pain, dysuria.  Denies trauma, fall or injury.  Patient is right-hand dominant.     Past Medical History:  Diagnosis Date  . Cancer ([place])    vulvular  . Cervical disc disease    a. 01/2018 MRI Cervical spine: Cervical spondylosis with multilevel disc and facet degeneration greatest at C5-6 and C6-7.  Moderate to sev R C5-6, mod L C5-6, and mod bilat C6-7 foraminal stenosis with multilevel mild foraminal stenosis.  Mild C5-6 and mod C6-7 canal stenosis. C6-7 central cord impingement w/ cord flattening.  . Chronic combined systolic (congestive) and diastolic (congestive) heart failure ([place])    a. 12/2017 Echo: EF 30-35%, mid-apicalanteroseptal, ant, and apical AK. Gr1 DD. Mild conc LVH; b. 03/2018 Echo: EF 45-50%, antsept, ant HK. Mild MR. Nl LA size. Nl RV fxn.  Marland Kitchen COPD (chronic obstructive pulmonary disease) (HCC)    not on home oxygen  . Coronary artery disease    a. 12/2017 ACS/PCI: LAD 100p (2.25x26 Resolute Onyx DES), 44m (2.0x12 Resolute Onyx DES), 80d (2.0x15 Resolute Onyx DES), RCA 90p (non-dominant). EF 25-35%. Post-MI course complicated by CGS; b. 09/9673 NSTEMI/subacute thrombosis-->LAD 100 (PTCA + DES x 1); c. 01/2018 NSTEMI/PCI: LM min irregs, LAD 50-60p ISR/hazy (3.5x12  Resolute DES), 20m/d (underexpansion of prior stents-->PTCA). EF 45-50%.  Marland Kitchen GERD (gastroesophageal reflux disease)   . H/O degenerative disc disease   . Hypotension   . Ischemic cardiomyopathy    a. 12/2017 Echo: EF 30-35%; b. 03/2018 Echo: EF 45-50%.  . Myocardial infarction ([place])    a. 12/2017-->DES to LAD x 3.  . Pancreatitis   . Shingles   . Spinal stenosis   . Tobacco abuse   . Ulcer (traumatic) of oral mucosa   . Urinary incontinence   . VIN III (vulvar intraepithelial neoplasia III)     Patient Active Problem List   Diagnosis Date Noted  . Diabetic ketoacidosis ([place]) 03/30/2020  . DKA (diabetic ketoacidosis) ([place]) 03/30/2020  . Vulvar cancer ([place]) 07/27/2019  . Palliative care patient 09/04/2018  . Hyperglycemia 05/28/2018  . Vulvar intraepithelial neoplasia (VIN) grade 3 05/24/2018  . History of ST elevation myocardial infarction (STEMI) 02/27/2018  . CAD (coronary artery disease) 02/27/2018  . Hyperlipidemia associated with type 2 diabetes mellitus ([place]) 02/27/2018  . Anxiety associated with depression 02/27/2018  . Chronic systolic heart failure ([place])   . Chest pain 12/26/2017  . Unstable angina ([place])   . Ischemic cardiomyopathy   . Former smoker   . Protein-calorie malnutrition, severe 07/18/2017  . Uncontrolled type 2 diabetes with neuropathy ([place]) 12/28/2014  . Centrilobular emphysema ([place]) 12/28/2014  . GERD (gastroesophageal reflux disease) 12/28/2014  . History of shingles 12/28/2014  . Hyperlipidemia LDL goal <70 12/28/2014    Past Surgical History:  Procedure Laterality Date  . ABDOMINAL  HYSTERECTOMY     partial  . CORONARY BALLOON ANGIOPLASTY N/A 05/22/2018   Procedure: CORONARY BALLOON ANGIOPLASTY;  Surgeon: Wellington Hampshire, MD;  Location: [facility];  Service: Cardiovascular;  Laterality: N/A;  . CORONARY STENT INTERVENTION N/A 12/26/2017   Procedure: CORONARY STENT INTERVENTION;  Surgeon: Wellington Hampshire, MD;  Location: [facility];  Service: Cardiovascular;  Laterality: N/A;  . CORONARY STENT INTERVENTION N/A 02/08/2018   Procedure: CORONARY STENT INTERVENTION;  Surgeon: Nelva Bush, MD;  Location: [place] [facility];  Service: Cardiovascular;  Laterality: N/A;  . CORONARY/GRAFT ACUTE MI REVASCULARIZATION N/A 12/19/2017   Procedure: Coronary/Graft Acute MI Revascularization;  Surgeon: Wellington Hampshire, MD;  Location: [facility];  Service: Cardiovascular;  Laterality: N/A;  . ECTOPIC PREGNANCY SURGERY Left   . INTRAVASCULAR ULTRASOUND/IVUS N/A 02/08/2018   Procedure: Intravascular Ultrasound/IVUS;  Surgeon: Nelva Bush, MD;  Location: [facility];  Service: Cardiovascular;  Laterality: N/A;  . LEFT HEART CATH AND CORONARY ANGIOGRAPHY N/A 12/19/2017   Procedure: LEFT HEART CATH AND CORONARY ANGIOGRAPHY;  Surgeon: Wellington Hampshire, MD;  Location: [facility];  Service: Cardiovascular;  Laterality: N/A;  . LEFT HEART CATH AND CORONARY ANGIOGRAPHY N/A 12/26/2017   Procedure: LEFT HEART CATH AND CORONARY ANGIOGRAPHY;  Surgeon: Wellington Hampshire, MD;  Location: [facility];  Service: Cardiovascular;  Laterality: N/A;  . LEFT HEART CATH AND CORONARY ANGIOGRAPHY N/A 02/08/2018   Procedure: LEFT HEART CATH AND CORONARY ANGIOGRAPHY;  Surgeon: Nelva Bush, MD;  Location: [facility];  Service: Cardiovascular;  Laterality: N/A;  . LEFT HEART CATH AND CORONARY ANGIOGRAPHY N/A 05/22/2018   Procedure: LEFT HEART CATH AND CORONARY ANGIOGRAPHY;  Surgeon: Wellington Hampshire, MD;  Location: [facility];  Service: Cardiovascular;  Laterality: N/A;  . LEFT HEART CATH AND CORONARY ANGIOGRAPHY N/A 06/15/2018   Procedure: LEFT HEART CATH AND CORONARY ANGIOGRAPHY;  Surgeon: Wellington Hampshire, MD;  Location: [facility];  Service: Cardiovascular;  Laterality: N/A;  . LYMPHADENECTOMY    . SPLENECTOMY, PARTIAL    . vulvulasectomy      Prior to Admission medications    Medication Sig Start Date End Date Taking? Authorizing Provider  oxyCODONE-acetaminophen (PERCOCET/ROXICET) 5-325 MG tablet Take 1 tablet by mouth every 4 (four) hours as needed for severe pain. 09/18/20  Yes Paulette Blanch, MD  albuterol (PROAIR HFA) 108 (90 Base) MCG/ACT inhaler Inhale 1 puff into the lungs every 6 (six) hours as needed for wheezing or shortness of breath.     [provider]  amitriptyline (ELAVIL) 25 MG tablet Take 1 tablet (25 mg total) by mouth at bedtime. Patient taking differently: Take 75 mg by mouth at bedtime.  04/05/20 05/05/20  ShahmehdiValeria Batman, MD  aspirin 81 MG tablet Take 81 mg by mouth daily.    [provider]  atorvastatin (LIPITOR) 80 MG tablet Take 1 tablet (80 mg total) by mouth daily at 6 PM. 03/05/19 03/30/20  Theora Gianotti, NP  BD VEO INSULIN SYRINGE U/F 31G X 15/64" 0.5 ML MISC use as directed 4 times daily Patient not taking: Reported on 04/17/2020 03/13/19   Olin Hauser, DO  Blood Glucose Monitoring Suppl (GLUCOCOM BLOOD GLUCOSE MONITOR) DEVI 1 m. 11/15/17   Parks Ranger, Devonne Doughty, DO  clonazePAM (KLONOPIN) 0.5 MG tablet Take 0.5 mg by mouth 3 (three) times daily as needed. 03/19/20   [provider]  docusate sodium (COLACE)  100 MG capsule Take 2 capsules (200 mg total) by mouth 2 (two) times daily. 04/05/20   Deatra James, MD  esomeprazole (NEXIUM) 40 MG capsule Take 1 capsule (40 mg total) by mouth daily before breakfast. 04/17/20   Malfi, Lupita Raider, FNP  famotidine (PEPCID) 20 MG tablet Take 1 tablet (20 mg total) by mouth 2 (two) times daily. 04/17/20   Malfi, Lupita Raider, FNP  insulin aspart (NOVOLOG) 100 UNIT/ML injection Inject 0-9 Units into the skin 3 (three) times daily with meals. To check blood sugars q. ACHS, follow the sliding scale provided Unit of insulin per sliding scale insulin Patient not taking: Reported on 04/17/2020 04/05/20   Shahmehdi, Valeria Batman, MD  insulin isophane & regular  human (NOVOLIN 70/30 FLEXPEN) (70-30) 100 UNIT/ML KwikPen Inject 18 Units into the skin 2 (two) times daily. 04/05/20   Deatra James, MD  Lancet Devices (SIMPLE DIAGNOSTICS LANCING DEV) MISC AS DIRECTED 11/15/17   Parks Ranger Devonne Doughty, DO  Lidocaine HCl 4 % GEL Apply topically. 02/05/20   [provider]  mirtazapine (REMERON) 15 MG tablet Take 15 mg by mouth at bedtime. 07/07/18   [provider]  nitroGLYCERIN (NITROSTAT) 0.4 MG SL tablet Place 1 tablet (0.4 mg total) under the tongue every 5 (five) minutes as needed for chest pain. 06/16/18   Gouru, Illene Silver, MD  ondansetron (ZOFRAN-ODT) 4 MG disintegrating tablet Take by mouth. 11/27/19   [provider]  oxyCODONE (OXYCONTIN) 60 MG 12 hr tablet Take 1 tablet by mouth every 8 (eight) hours.    [provider]  Oxycodone HCl 20 MG TABS Take 20 mg by mouth every 3 (three) hours as needed.  04/14/20   [provider]  polyethylene glycol powder (GLYCOLAX/MIRALAX) 17 GM/SCOOP powder Take 17 g by mouth daily.  10/20/14   [provider]  ranolazine (RANEXA) 500 MG 12 hr tablet Take 1 tablet (500 mg total) by mouth 2 (two) times daily. 04/05/20 05/05/20  Deatra James, MD  SSD 1 % cream SMARTSIG:1 Topical 4-5 Times Daily 01/25/20   [provider]  sucralfate (CARAFATE) 1 g tablet Take 1 g by mouth 4 (four) times daily. 03/01/20   [provider]  ticagrelor (BRILINTA) 90 MG TABS tablet Take 1 tablet (90 mg total) by mouth 2 (two) times daily. *NEEDS OFFICE VISIT FOR FURTHER REFILLS-PLEASE CALL [phone] TO SCHEDULE.* 05/14/19   Theora Gianotti, NP    Allergies Bee venom, Metformin and related, Darvon [propoxyphene], Gabapentin, Nsaids, Tramadol, and Contrast media [iodinated diagnostic agents]  Family History  Problem Relation Age of Onset  . Non-Hodgkin's lymphoma Mother   . Osteoporosis Mother   . Lupus Sister   . Heart disease Brother   . Heart attack  Brother   . Heart attack Father     Social History Social History   Tobacco Use  . Smoking status: Former Smoker    Packs/day: 0.50    Years: 30.00    Pack years: 15.00    Types: Cigarettes    Quit date: 12/19/2017    Years since quitting: 2.7  . Smokeless tobacco: Former Systems developer  . Tobacco comment: quit 12/19/2017.  Vaping Use  . Vaping Use: Never used  Substance Use Topics  . Alcohol use: No  . Drug use: No    Review of Systems  Constitutional: No fever/chills Eyes: No visual changes. ENT: No sore throat. Cardiovascular: Denies chest pain. Respiratory: Denies shortness of breath. Gastrointestinal: No abdominal pain.  No  nausea, no vomiting.  No diarrhea.  No constipation. Genitourinary: Negative for dysuria. Musculoskeletal: Positive for left shoulder pain.  Negative for back pain. Skin: Negative for rash. Neurological: Negative for headaches, focal weakness or numbness.   ____________________________________________   PHYSICAL EXAM:  VITAL SIGNS: ED Triage Vitals  Enc Vitals Group     BP 09/18/20 0358 131/62     Pulse Rate 09/18/20 0358 73     Resp 09/18/20 0358 20     Temp 09/18/20 0358 98.4 F (36.9 C)     Temp src --      SpO2 09/18/20 0358 100 %     Weight 09/18/20 0359 130 lb (59 kg)     Height 09/18/20 0359 5\' 1"  (1.549 m)     Head Circumference --      Peak Flow --      Pain Score 09/18/20 0358 10     Pain Loc --      Pain Edu? --      Excl. in [place]? --     Constitutional: Alert and oriented. Well appearing and in mild acute distress.  Holding left shoulder with right hand. Eyes: Conjunctivae are normal. PERRL. EOMI. Head: Atraumatic. Nose: No congestion/rhinnorhea. Mouth/Throat: Mucous membranes are moist.   Neck: No stridor.  No carotid bruits.  Supple neck without meningismus Cardiovascular: Normal rate, regular rhythm. Grossly normal heart sounds.  Good peripheral circulation. Respiratory: Normal respiratory effort.  No retractions. Lungs  CTAB. Gastrointestinal: Soft and nontender to light or deep palpation. No distention. No abdominal bruits. No CVA tenderness. Musculoskeletal:  Left anterior shoulder tender to palpation and with range of movement.  2+ radial pulse.  Brisk, less than 5-second capillary refill No lower extremity tenderness nor edema.  No joint effusions. Neurologic:  Normal speech and language. No gross focal neurologic deficits are appreciated. No gait instability. Skin:  Skin is warm, dry and intact. No rash noted. Psychiatric: Mood and affect are normal. Speech and behavior are normal.  ____________________________________________   LABS (all labs ordered are listed, but only abnormal results are displayed)  Labs Reviewed  COMPREHENSIVE METABOLIC PANEL - Abnormal; Notable for the following components:      Result Value   Glucose, Bld 329 (*)    All other components within normal limits  URINALYSIS, COMPLETE (UACMP) WITH MICROSCOPIC - Abnormal; Notable for the following components:   Color, Urine STRAW (*)    APPearance CLEAR (*)    Glucose, UA >=500 (*)    All other components within normal limits  CBC  LIPASE, BLOOD  TROPONIN I (HIGH SENSITIVITY)  TROPONIN I (HIGH SENSITIVITY)   ____________________________________________  EKG  ED ECG REPORT I, Suann Klier J, the attending physician, personally viewed and interpreted this ECG.   Date: 09/18/2020  EKG Time: 0402  Rate: 76  Rhythm: normal EKG, normal sinus rhythm  Axis: Normal  Intervals:none  ST&T Change: Nonspecific  ____________________________________________  RADIOLOGY I, Khrystian Schauf J, personally viewed and evaluated these images (plain radiographs) as part of my medical decision making, as well as reviewing the written report by the radiologist.  ED MD interpretation: No acute cardiopulmonary process  Official radiology report(s): DG Chest 2 View  Result Date: 09/18/2020 CLINICAL DATA:  Chest pain EXAM: CHEST - 2 VIEW  COMPARISON:  03/30/2020 FINDINGS: Minimal left basilar scarring is noted. Tiny granuloma within the right upper lobe. The lungs are otherwise clear. No pneumothorax or pleural effusion. Cardiac size within normal limits. Pulmonary vascularity is normal. No acute bone abnormality.  IMPRESSION: No active cardiopulmonary disease. Electronically Signed   By: Fidela Salisbury MD   On: 09/18/2020 04:37    ____________________________________________   PROCEDURES  Procedure(s) performed (including Critical Care):  Procedures   ____________________________________________   INITIAL IMPRESSION / ASSESSMENT AND PLAN / ED COURSE  As part of my medical decision making, I reviewed the following data within the [facility] notes reviewed and incorporated, Labs reviewed, EKG interpreted, Old chart reviewed, Radiograph reviewed, Notes from prior ED visits and [place] Controlled Substance Database     55 year old female presenting with left shoulder pain, history of ACS Differential diagnosis includes, but is not limited to, ACS, aortic dissection, pulmonary embolism, cardiac tamponade, pneumothorax, pneumonia, pericarditis, myocarditis, GI-related causes including esophagitis/gastritis, and musculoskeletal chest wall pain.    Initial EKG and troponin unremarkable.  Hyperglycemia without elevated anion gap.  Will repeat troponin, obtain urinalysis as patient is also experiencing nausea, vomiting and diarrhea without abdominal pain.  Suspect bursitis to left shoulder.  Will initiate IV fluid resuscitation, analgesia and antiemetic.  Will reassess.  Clinical Course as of 09/18/20 0655  Thu Sep 18, 2020  0651 Updated patient on repeat negative troponin.  Plan to discharge after completion of IV fluids.  Will discharge with limited prescription for Percocet and sling.  Patient will follow up closely with her PCP.  Strict return precautions given.  Patient verbalizes understanding agrees with  plan of care. [JS]    Clinical Course User Index [JS] Paulette Blanch, MD     ____________________________________________   FINAL CLINICAL IMPRESSION(S) / ED DIAGNOSES  Final diagnoses:  Nonspecific chest pain  Hyperglycemia  Acute bursitis of left shoulder     ED Discharge Orders         Ordered    oxyCODONE-acetaminophen (PERCOCET/ROXICET) 5-325 MG tablet  Every 4 hours PRN        09/18/20 1245          *Please note:  Kirstyn Lean was evaluated in Emergency Department on 09/18/2020 for the symptoms described in the history of present illness. She was evaluated in the context of the global COVID-19 pandemic, which necessitated consideration that the patient might be at risk for infection with the SARS-CoV-2 virus that causes COVID-19. Institutional protocols and algorithms that pertain to the evaluation of patients at risk for COVID-19 are in a state of rapid change based on information released by regulatory bodies including the CDC and federal and state organizations. These policies and algorithms were followed during the patient's care in the ED.  Some ED evaluations and interventions may be delayed as a result of limited staffing during and the pandemic.*   Note:  This document was prepared using Dragon voice recognition software and may include unintentional dictation errors.   Paulette Blanch, MD 09/18/20 [phone]

## 2020-09-18 NOTE — ED Notes (Signed)
Pt calm , collective denied pain or sob. Ambulatory upon discharge

## 2020-09-18 NOTE — Discharge Instructions (Addendum)
You may take Percocet as needed for pain. Wear sling as needed for comfort. Return to the ER for worsening symptoms, persistent vomiting, difficulty breathing or other concerns.

## 2020-09-18 NOTE — ED Notes (Addendum)
Another RN attempted x1. Used vein finder but pt does not have any veins. Charge RN made aware and at bedside to attempt to stick pt

## 2020-09-19 ENCOUNTER — Telehealth: Payer: Self-pay

## 2020-09-19 NOTE — Telephone Encounter (Signed)
Transition Care Management Unsuccessful Follow-up Telephone Call  Date of discharge and from where:  09/18/2020 from [facility]  Attempts:  1st Attempt  Reason for unsuccessful TCM follow-up call:  Left voice message

## 2020-09-22 ENCOUNTER — Other Ambulatory Visit: Payer: Self-pay

## 2020-09-22 DIAGNOSIS — K219 Gastro-esophageal reflux disease without esophagitis: Secondary | ICD-10-CM

## 2020-09-22 MED ORDER — ESOMEPRAZOLE MAGNESIUM 40 MG PO CPDR: 40.0000 mg | DELAYED_RELEASE_CAPSULE | Freq: Every day | ORAL | 0 refills | Status: DC

## 2020-09-22 MED ORDER — FAMOTIDINE 20 MG PO TABS: 20.0000 mg | ORAL_TABLET | Freq: Two times a day (BID) | ORAL | 0 refills | Status: DC

## 2020-09-22 NOTE — Telephone Encounter (Signed)
Transition Care Management Unsuccessful Follow-up Telephone Call  Date of discharge and from where:  09/18/2020 from [facility]  Attempts:  2nd Attempt  Reason for unsuccessful TCM follow-up call:  Left voice message

## 2020-09-22 NOTE — Telephone Encounter (Signed)
Script resent

## 2020-09-23 NOTE — Telephone Encounter (Signed)
Transition Care Management Unsuccessful Follow-up Telephone Call  Date of discharge and from where:  09/18/2020 from [facility]  Attempts:  3rd Attempt  Reason for unsuccessful TCM follow-up call:  Unable to reach patient

## 2020-10-10 ENCOUNTER — Observation Stay: Payer: Medicare Other

## 2020-10-10 ENCOUNTER — Encounter: Payer: Self-pay | Admitting: Emergency Medicine

## 2020-10-10 ENCOUNTER — Other Ambulatory Visit: Payer: Self-pay

## 2020-10-10 ENCOUNTER — Inpatient Hospital Stay: Payer: Medicare Other

## 2020-10-10 ENCOUNTER — Inpatient Hospital Stay
Admission: EM | Admit: 2020-10-10 | Discharge: 2020-10-12 | DRG: 640 | Disposition: A | Discharge status: Home / Self Care | Payer: Medicare Other | Attending: Internal Medicine | Admitting: Internal Medicine

## 2020-10-10 DIAGNOSIS — Z87891 Personal history of nicotine dependence: Secondary | ICD-10-CM | POA: Diagnosis not present

## 2020-10-10 DIAGNOSIS — I5042 Chronic combined systolic (congestive) and diastolic (congestive) heart failure: Secondary | ICD-10-CM | POA: Diagnosis present

## 2020-10-10 DIAGNOSIS — Z91041 Radiographic dye allergy status: Secondary | ICD-10-CM

## 2020-10-10 DIAGNOSIS — E785 Hyperlipidemia, unspecified: Secondary | ICD-10-CM | POA: Diagnosis present

## 2020-10-10 DIAGNOSIS — F1120 Opioid dependence, uncomplicated: Secondary | ICD-10-CM

## 2020-10-10 DIAGNOSIS — Z8544 Personal history of malignant neoplasm of other female genital organs: Secondary | ICD-10-CM | POA: Diagnosis not present

## 2020-10-10 DIAGNOSIS — R64 Cachexia: Secondary | ICD-10-CM | POA: Diagnosis not present

## 2020-10-10 DIAGNOSIS — Z6824 Body mass index (BMI) 24.0-24.9, adult: Secondary | ICD-10-CM

## 2020-10-10 DIAGNOSIS — Z20822 Contact with and (suspected) exposure to covid-19: Secondary | ICD-10-CM | POA: Diagnosis not present

## 2020-10-10 DIAGNOSIS — I252 Old myocardial infarction: Secondary | ICD-10-CM | POA: Diagnosis not present

## 2020-10-10 DIAGNOSIS — E860 Dehydration: Secondary | ICD-10-CM | POA: Diagnosis not present

## 2020-10-10 DIAGNOSIS — E1165 Type 2 diabetes mellitus with hyperglycemia: Secondary | ICD-10-CM | POA: Diagnosis not present

## 2020-10-10 DIAGNOSIS — J432 Centrilobular emphysema: Secondary | ICD-10-CM | POA: Diagnosis not present

## 2020-10-10 DIAGNOSIS — Z79899 Other long term (current) drug therapy: Secondary | ICD-10-CM

## 2020-10-10 DIAGNOSIS — R059 Cough, unspecified: Secondary | ICD-10-CM

## 2020-10-10 DIAGNOSIS — E1169 Type 2 diabetes mellitus with other specified complication: Secondary | ICD-10-CM | POA: Diagnosis not present

## 2020-10-10 DIAGNOSIS — Z79891 Long term (current) use of opiate analgesic: Secondary | ICD-10-CM

## 2020-10-10 DIAGNOSIS — Z832 Family history of diseases of the blood and blood-forming organs and certain disorders involving the immune mechanism: Secondary | ICD-10-CM

## 2020-10-10 DIAGNOSIS — Z8249 Family history of ischemic heart disease and other diseases of the circulatory system: Secondary | ICD-10-CM

## 2020-10-10 DIAGNOSIS — R112 Nausea with vomiting, unspecified: Secondary | ICD-10-CM | POA: Diagnosis present

## 2020-10-10 DIAGNOSIS — E43 Unspecified severe protein-calorie malnutrition: Secondary | ICD-10-CM | POA: Diagnosis not present

## 2020-10-10 DIAGNOSIS — K219 Gastro-esophageal reflux disease without esophagitis: Secondary | ICD-10-CM | POA: Diagnosis present

## 2020-10-10 DIAGNOSIS — E119 Type 2 diabetes mellitus without complications: Secondary | ICD-10-CM | POA: Insufficient documentation

## 2020-10-10 DIAGNOSIS — M47812 Spondylosis without myelopathy or radiculopathy, cervical region: Secondary | ICD-10-CM | POA: Diagnosis not present

## 2020-10-10 DIAGNOSIS — A084 Viral intestinal infection, unspecified: Secondary | ICD-10-CM | POA: Diagnosis present

## 2020-10-10 DIAGNOSIS — R197 Diarrhea, unspecified: Secondary | ICD-10-CM

## 2020-10-10 DIAGNOSIS — K529 Noninfective gastroenteritis and colitis, unspecified: Secondary | ICD-10-CM

## 2020-10-10 DIAGNOSIS — Z7902 Long term (current) use of antithrombotics/antiplatelets: Secondary | ICD-10-CM | POA: Diagnosis not present

## 2020-10-10 DIAGNOSIS — R1084 Generalized abdominal pain: Secondary | ICD-10-CM

## 2020-10-10 DIAGNOSIS — I110 Hypertensive heart disease with heart failure: Secondary | ICD-10-CM | POA: Diagnosis not present

## 2020-10-10 DIAGNOSIS — Z8262 Family history of osteoporosis: Secondary | ICD-10-CM

## 2020-10-10 DIAGNOSIS — I2510 Atherosclerotic heart disease of native coronary artery without angina pectoris: Secondary | ICD-10-CM | POA: Diagnosis present

## 2020-10-10 DIAGNOSIS — Z7982 Long term (current) use of aspirin: Secondary | ICD-10-CM | POA: Diagnosis not present

## 2020-10-10 DIAGNOSIS — Z515 Encounter for palliative care: Secondary | ICD-10-CM

## 2020-10-10 DIAGNOSIS — Z91030 Bee allergy status: Secondary | ICD-10-CM

## 2020-10-10 DIAGNOSIS — I255 Ischemic cardiomyopathy: Secondary | ICD-10-CM | POA: Diagnosis present

## 2020-10-10 DIAGNOSIS — R739 Hyperglycemia, unspecified: Secondary | ICD-10-CM | POA: Diagnosis present

## 2020-10-10 DIAGNOSIS — Z885 Allergy status to narcotic agent status: Secondary | ICD-10-CM

## 2020-10-10 DIAGNOSIS — J449 Chronic obstructive pulmonary disease, unspecified: Secondary | ICD-10-CM | POA: Insufficient documentation

## 2020-10-10 DIAGNOSIS — Z955 Presence of coronary angioplasty implant and graft: Secondary | ICD-10-CM

## 2020-10-10 DIAGNOSIS — I25118 Atherosclerotic heart disease of native coronary artery with other forms of angina pectoris: Secondary | ICD-10-CM | POA: Diagnosis not present

## 2020-10-10 DIAGNOSIS — Z807 Family history of other malignant neoplasms of lymphoid, hematopoietic and related tissues: Secondary | ICD-10-CM

## 2020-10-10 DIAGNOSIS — D071 Carcinoma in situ of vulva: Secondary | ICD-10-CM | POA: Diagnosis present

## 2020-10-10 DIAGNOSIS — I5022 Chronic systolic (congestive) heart failure: Secondary | ICD-10-CM | POA: Diagnosis not present

## 2020-10-10 DIAGNOSIS — C519 Malignant neoplasm of vulva, unspecified: Secondary | ICD-10-CM | POA: Diagnosis present

## 2020-10-10 DIAGNOSIS — F418 Other specified anxiety disorders: Secondary | ICD-10-CM | POA: Diagnosis present

## 2020-10-10 DIAGNOSIS — Z886 Allergy status to analgesic agent status: Secondary | ICD-10-CM

## 2020-10-10 DIAGNOSIS — R0981 Nasal congestion: Secondary | ICD-10-CM

## 2020-10-10 DIAGNOSIS — R079 Chest pain, unspecified: Secondary | ICD-10-CM | POA: Diagnosis present

## 2020-10-10 DIAGNOSIS — Z794 Long term (current) use of insulin: Secondary | ICD-10-CM | POA: Diagnosis not present

## 2020-10-10 DIAGNOSIS — R54 Age-related physical debility: Secondary | ICD-10-CM | POA: Diagnosis present

## 2020-10-10 LAB — COMPREHENSIVE METABOLIC PANEL
ALT: 13 U/L (ref 0–44)
AST: 12 U/L — ABNORMAL LOW (ref 15–41)
Albumin: 4.2 g/dL (ref 3.5–5.0)
Alkaline Phosphatase: 167 U/L — ABNORMAL HIGH (ref 38–126)
Anion gap: 16 — ABNORMAL HIGH (ref 5–15)
BUN: 9 mg/dL (ref 6–20)
CO2: 18 mmol/L — ABNORMAL LOW (ref 22–32)
Calcium: 9.9 mg/dL (ref 8.9–10.3)
Chloride: 100 mmol/L (ref 98–111)
Creatinine, Ser: 0.69 mg/dL (ref 0.44–1.00)
GFR, Estimated: >60 mL/min (ref >60)
Glucose, Bld: 310 mg/dL — ABNORMAL HIGH (ref 70–99)
Potassium: 3.9 mmol/L (ref 3.5–5.1)
Sodium: 134 mmol/L — ABNORMAL LOW (ref 135–145)
Total Bilirubin: 1.2 mg/dL (ref 0.3–1.2)
Total Protein: 9.5 g/dL — ABNORMAL HIGH (ref 6.5–8.1)

## 2020-10-10 LAB — URINALYSIS, COMPLETE (UACMP) WITH MICROSCOPIC
Bacteria, UA: NONE SEEN
Bilirubin Urine: NEGATIVE
Glucose, UA: >=500 mg/dL — AB
Hgb urine dipstick: NEGATIVE
Ketones, ur: 80 mg/dL — AB
Leukocytes,Ua: NEGATIVE
Nitrite: NEGATIVE
Protein, ur: NEGATIVE mg/dL
Specific Gravity, Urine: 1.012 (ref 1.005–1.030)
pH: 6 (ref 5.0–8.0)

## 2020-10-10 LAB — CBC WITH DIFFERENTIAL/PLATELET
Abs Immature Granulocytes: 0.02 10*3/uL (ref 0.00–0.07)
Basophils Absolute: 0 10*3/uL (ref 0.0–0.1)
Basophils Relative: 0 %
Eosinophils Absolute: 0 10*3/uL (ref 0.0–0.5)
Eosinophils Relative: 1 %
HCT: 48.9 % — ABNORMAL HIGH (ref 36.0–46.0)
Hemoglobin: 16.3 g/dL — ABNORMAL HIGH (ref 12.0–15.0)
Immature Granulocytes: 0 %
Lymphocytes Relative: 23 %
Lymphs Abs: 1.5 10*3/uL (ref 0.7–4.0)
MCH: 31.3 pg (ref 26.0–34.0)
MCHC: 33.3 g/dL (ref 30.0–36.0)
MCV: 93.9 fL (ref 80.0–100.0)
Monocytes Absolute: 0.3 10*3/uL (ref 0.1–1.0)
Monocytes Relative: 4 %
Neutro Abs: 4.8 10*3/uL (ref 1.7–7.7)
Neutrophils Relative %: 72 %
Platelets: 428 10*3/uL — ABNORMAL HIGH (ref 150–400)
RBC: 5.21 MIL/uL — ABNORMAL HIGH (ref 3.87–5.11)
RDW: 13.2 % (ref 11.5–15.5)
WBC: 6.7 10*3/uL (ref 4.0–10.5)
nRBC: 0 % (ref 0.0–0.2)

## 2020-10-10 LAB — GLUCOSE, CAPILLARY: Glucose-Capillary: 246 mg/dL — ABNORMAL HIGH (ref 70–99)

## 2020-10-10 LAB — BASIC METABOLIC PANEL
Anion gap: 14 (ref 5–15)
BUN: 8 mg/dL (ref 6–20)
CO2: 16 mmol/L — ABNORMAL LOW (ref 22–32)
Calcium: 8.9 mg/dL (ref 8.9–10.3)
Chloride: 104 mmol/L (ref 98–111)
Creatinine, Ser: 0.62 mg/dL (ref 0.44–1.00)
GFR, Estimated: >60 mL/min (ref >60)
Glucose, Bld: 314 mg/dL — ABNORMAL HIGH (ref 70–99)
Potassium: 4.2 mmol/L (ref 3.5–5.1)
Sodium: 134 mmol/L — ABNORMAL LOW (ref 135–145)

## 2020-10-10 LAB — HEMOGLOBIN A1C
Hgb A1c MFr Bld: 10.4 % — ABNORMAL HIGH (ref 4.8–5.6)
Mean Plasma Glucose: 251.78 mg/dL

## 2020-10-10 LAB — PROCALCITONIN: Procalcitonin: <0.1 ng/mL

## 2020-10-10 LAB — LIPASE, BLOOD: Lipase: 20 U/L (ref 11–51)

## 2020-10-10 MED ORDER — INSULIN ASPART 100 UNIT/ML ~~LOC~~ SOLN: 0.0000 [IU] | Freq: Every day | SUBCUTANEOUS | Status: DC

## 2020-10-10 MED ORDER — ALBUTEROL SULFATE HFA 108 (90 BASE) MCG/ACT IN AERS
1.0000 | INHALATION_SPRAY | Freq: Four times a day (QID) | RESPIRATORY_TRACT | Status: DC | PRN
  Filled 2020-10-10: qty 6.7

## 2020-10-10 MED ORDER — MOMETASONE FURO-FORMOTEROL FUM 200-5 MCG/ACT IN AERO
2.0000 | INHALATION_SPRAY | Freq: Two times a day (BID) | RESPIRATORY_TRACT | Status: DC
  Administered 2020-10-11 – 2020-10-12 (×3): 2 via RESPIRATORY_TRACT
  Filled 2020-10-10: qty 8.8

## 2020-10-10 MED ORDER — HYDROMORPHONE HCL 1 MG/ML IJ SOLN
1.0000 mg | Freq: Once | INTRAMUSCULAR | Status: AC
  Administered 2020-10-10: 1 mg via INTRAVENOUS
  Filled 2020-10-10: qty 1

## 2020-10-10 MED ORDER — MIDODRINE HCL 5 MG PO TABS
5.0000 mg | ORAL_TABLET | Freq: Three times a day (TID) | ORAL | Status: DC | PRN
  Filled 2020-10-10: qty 1

## 2020-10-10 MED ORDER — OXYCODONE HCL ER 15 MG PO T12A
80.0000 mg | EXTENDED_RELEASE_TABLET | ORAL | Status: AC
  Administered 2020-10-10: 80 mg via ORAL
  Filled 2020-10-10: qty 2

## 2020-10-10 MED ORDER — TICAGRELOR 90 MG PO TABS
90.0000 mg | ORAL_TABLET | Freq: Two times a day (BID) | ORAL | Status: DC
  Administered 2020-10-10 – 2020-10-12 (×4): 90 mg via ORAL
  Filled 2020-10-10 (×5): qty 1

## 2020-10-10 MED ORDER — CLONAZEPAM 0.5 MG PO TABS
0.5000 mg | ORAL_TABLET | Freq: Three times a day (TID) | ORAL | Status: DC | PRN
  Administered 2020-10-11: 0.5 mg via ORAL
  Filled 2020-10-10: qty 1

## 2020-10-10 MED ORDER — ACETAMINOPHEN 325 MG PO TABS: 650.0000 mg | ORAL_TABLET | Freq: Four times a day (QID) | ORAL | Status: DC | PRN

## 2020-10-10 MED ORDER — INSULIN ASPART PROT & ASPART (70-30 MIX) 100 UNIT/ML ~~LOC~~ SUSP
18.0000 [IU] | Freq: Two times a day (BID) | SUBCUTANEOUS | Status: DC
  Administered 2020-10-11 – 2020-10-12 (×2): 18 [IU] via SUBCUTANEOUS
  Filled 2020-10-10: qty 10

## 2020-10-10 MED ORDER — ONDANSETRON HCL 4 MG/2ML IJ SOLN
4.0000 mg | INTRAMUSCULAR | Status: AC
  Administered 2020-10-10: 4 mg via INTRAVENOUS
  Filled 2020-10-10: qty 2

## 2020-10-10 MED ORDER — HYDROCORTISONE NA SUCCINATE PF 250 MG IJ SOLR
200.0000 mg | Freq: Once | INTRAMUSCULAR | Status: AC
  Administered 2020-10-10: 200 mg via INTRAVENOUS
  Filled 2020-10-10: qty 200

## 2020-10-10 MED ORDER — DOCUSATE SODIUM 100 MG PO CAPS
200.0000 mg | ORAL_CAPSULE | Freq: Two times a day (BID) | ORAL | Status: DC
  Administered 2020-10-10: 200 mg via ORAL
  Filled 2020-10-10: qty 2

## 2020-10-10 MED ORDER — ISOSORBIDE MONONITRATE ER 30 MG PO TB24
30.0000 mg | ORAL_TABLET | Freq: Every day | ORAL | Status: DC
  Administered 2020-10-11 – 2020-10-12 (×2): 30 mg via ORAL
  Filled 2020-10-10 (×2): qty 1

## 2020-10-10 MED ORDER — CLONAZEPAM 1 MG PO TABS
1.0000 mg | ORAL_TABLET | Freq: Three times a day (TID) | ORAL | Status: DC | PRN
  Administered 2020-10-10: 1 mg via ORAL
  Filled 2020-10-10: qty 2

## 2020-10-10 MED ORDER — PANTOPRAZOLE SODIUM 40 MG PO TBEC
40.0000 mg | DELAYED_RELEASE_TABLET | Freq: Every day | ORAL | Status: DC
  Administered 2020-10-11 – 2020-10-12 (×2): 40 mg via ORAL
  Filled 2020-10-10 (×2): qty 1

## 2020-10-10 MED ORDER — LACTATED RINGERS IV BOLUS
1000.0000 mL | Freq: Once | INTRAVENOUS | Status: AC
  Administered 2020-10-10: 1000 mL via INTRAVENOUS

## 2020-10-10 MED ORDER — INSULIN ISOPHANE & REGULAR (HUMAN 70-30)100 UNIT/ML KWIKPEN: 18.0000 [IU] | PEN_INJECTOR | Freq: Two times a day (BID) | SUBCUTANEOUS | Status: DC

## 2020-10-10 MED ORDER — ASPIRIN EC 81 MG PO TBEC
81.0000 mg | DELAYED_RELEASE_TABLET | Freq: Every day | ORAL | Status: DC
  Administered 2020-10-10 – 2020-10-12 (×3): 81 mg via ORAL
  Filled 2020-10-10 (×3): qty 1

## 2020-10-10 MED ORDER — PIPERACILLIN-TAZOBACTAM 3.375 G IVPB 30 MIN
3.3750 g | Freq: Once | INTRAVENOUS | Status: AC
  Administered 2020-10-10: 3.375 g via INTRAVENOUS
  Filled 2020-10-10: qty 50

## 2020-10-10 MED ORDER — MIRTAZAPINE 15 MG PO TABS
15.0000 mg | ORAL_TABLET | Freq: Every day | ORAL | Status: DC
  Administered 2020-10-10 – 2020-10-11 (×2): 15 mg via ORAL
  Filled 2020-10-10 (×2): qty 1

## 2020-10-10 MED ORDER — INSULIN ASPART 100 UNIT/ML ~~LOC~~ SOLN
0.0000 [IU] | Freq: Three times a day (TID) | SUBCUTANEOUS | Status: DC
  Administered 2020-10-11: 20 [IU] via SUBCUTANEOUS
  Administered 2020-10-11 (×2): 4 [IU] via SUBCUTANEOUS
  Administered 2020-10-12: 11 [IU] via SUBCUTANEOUS
  Filled 2020-10-10 (×5): qty 1

## 2020-10-10 MED ORDER — ENOXAPARIN SODIUM 40 MG/0.4ML ~~LOC~~ SOLN
40.0000 mg | SUBCUTANEOUS | Status: DC
  Administered 2020-10-11: 40 mg via SUBCUTANEOUS
  Filled 2020-10-10: qty 0.4

## 2020-10-10 MED ORDER — INSULIN ASPART 100 UNIT/ML ~~LOC~~ SOLN: 0.0000 [IU] | Freq: Three times a day (TID) | SUBCUTANEOUS | Status: DC

## 2020-10-10 MED ORDER — OXYCODONE HCL ER 20 MG PO T12A
80.0000 mg | EXTENDED_RELEASE_TABLET | Freq: Three times a day (TID) | ORAL | Status: DC
  Administered 2020-10-10 – 2020-10-12 (×6): 80 mg via ORAL
  Filled 2020-10-10 (×6): qty 4

## 2020-10-10 MED ORDER — ACETAMINOPHEN 650 MG RE SUPP: 650.0000 mg | Freq: Four times a day (QID) | RECTAL | Status: DC | PRN

## 2020-10-10 MED ORDER — ONDANSETRON HCL 4 MG PO TABS
4.0000 mg | ORAL_TABLET | Freq: Four times a day (QID) | ORAL | Status: DC | PRN
  Administered 2020-10-11 – 2020-10-12 (×2): 4 mg via ORAL
  Filled 2020-10-10 (×2): qty 1

## 2020-10-10 MED ORDER — DIPHENHYDRAMINE HCL 25 MG PO CAPS
50.0000 mg | ORAL_CAPSULE | Freq: Once | ORAL | Status: AC
  Filled 2020-10-10: qty 2

## 2020-10-10 MED ORDER — SODIUM CHLORIDE 0.9 % IV SOLN
12.5000 mg | Freq: Four times a day (QID) | INTRAVENOUS | Status: DC | PRN
  Filled 2020-10-10: qty 0.5

## 2020-10-10 MED ORDER — ATORVASTATIN CALCIUM 20 MG PO TABS
80.0000 mg | ORAL_TABLET | Freq: Every day | ORAL | Status: DC
  Administered 2020-10-10 – 2020-10-11 (×2): 80 mg via ORAL
  Filled 2020-10-10 (×2): qty 4

## 2020-10-10 MED ORDER — ONDANSETRON HCL 4 MG/2ML IJ SOLN
4.0000 mg | Freq: Four times a day (QID) | INTRAMUSCULAR | Status: DC | PRN
  Administered 2020-10-11: 4 mg via INTRAVENOUS
  Filled 2020-10-10: qty 2

## 2020-10-10 MED ORDER — OXYCODONE HCL 5 MG PO TABS
30.0000 mg | ORAL_TABLET | ORAL | Status: DC | PRN
  Administered 2020-10-10 – 2020-10-12 (×6): 30 mg via ORAL
  Filled 2020-10-10 (×6): qty 6

## 2020-10-10 MED ORDER — MORPHINE SULFATE (PF) 4 MG/ML IV SOLN
4.0000 mg | Freq: Once | INTRAVENOUS | Status: AC
Start: 2020-10-10 — End: 2020-10-10
  Administered 2020-10-10: 4 mg via INTRAVENOUS
  Filled 2020-10-10: qty 1

## 2020-10-10 MED ORDER — RANOLAZINE ER 500 MG PO TB12
500.0000 mg | ORAL_TABLET | Freq: Two times a day (BID) | ORAL | Status: DC
  Administered 2020-10-10 – 2020-10-12 (×4): 500 mg via ORAL
  Filled 2020-10-10 (×5): qty 1

## 2020-10-10 MED ORDER — NITROGLYCERIN 0.4 MG SL SUBL: 0.4000 mg | SUBLINGUAL_TABLET | SUBLINGUAL | Status: DC | PRN

## 2020-10-10 MED ORDER — MIDODRINE HCL 5 MG PO TABS: 5.0000 mg | ORAL_TABLET | Freq: Three times a day (TID) | ORAL | Status: DC | PRN

## 2020-10-10 MED ORDER — INSULIN ASPART 100 UNIT/ML ~~LOC~~ SOLN
0.0000 [IU] | Freq: Every day | SUBCUTANEOUS | Status: DC
  Administered 2020-10-10: 2 [IU] via SUBCUTANEOUS
  Filled 2020-10-10: qty 1

## 2020-10-10 MED ORDER — OXYCODONE HCL 5 MG PO TABS
30.0000 mg | ORAL_TABLET | ORAL | Status: AC
  Administered 2020-10-10: 30 mg via ORAL
  Filled 2020-10-10: qty 6

## 2020-10-10 MED ORDER — DIGOXIN 125 MCG PO TABS
0.1250 mg | ORAL_TABLET | Freq: Every day | ORAL | Status: DC
  Administered 2020-10-11 – 2020-10-12 (×2): 0.125 mg via ORAL
  Filled 2020-10-10 (×2): qty 1

## 2020-10-10 MED ORDER — AMITRIPTYLINE HCL 75 MG PO TABS
75.0000 mg | ORAL_TABLET | Freq: Every day | ORAL | Status: DC
  Administered 2020-10-10 – 2020-10-11 (×2): 75 mg via ORAL
  Filled 2020-10-10 (×3): qty 1

## 2020-10-10 MED ORDER — IOHEXOL 300 MG/ML  SOLN
100.0000 mL | Freq: Once | INTRAMUSCULAR | Status: AC | PRN
  Administered 2020-10-10: 100 mL via INTRAVENOUS

## 2020-10-10 MED ORDER — DIPHENHYDRAMINE HCL 50 MG/ML IJ SOLN
50.0000 mg | Freq: Once | INTRAMUSCULAR | Status: AC
  Administered 2020-10-10: 50 mg via INTRAVENOUS
  Filled 2020-10-10: qty 1

## 2020-10-10 MED ORDER — FAMOTIDINE 20 MG PO TABS
20.0000 mg | ORAL_TABLET | Freq: Two times a day (BID) | ORAL | Status: DC
  Administered 2020-10-10 – 2020-10-12 (×4): 20 mg via ORAL
  Filled 2020-10-10 (×4): qty 1

## 2020-10-10 NOTE — ED Notes (Signed)
ED Provider at bedside. 

## 2020-10-10 NOTE — ED Notes (Signed)
Patient reports self dosing insulin r/t pending medication orders/med verification. Patient reports 18 units of Novolin was given by personal supply. Dr. Tobie Poet notified. Inpatient RN also notified via handoff.

## 2020-10-10 NOTE — ED Provider Notes (Signed)
[facility] Emergency Department Provider Note  ____________________________________________   Event Date/Time   First MD Initiated Contact with Patient 10/10/20 [phone]     (approximate)  I have reviewed the triage vital signs and the nursing notes.   HISTORY  Chief Complaint Abdominal Pain, Emesis, and Diarrhea    HPI Renee Mack is a 55 y.o. female with medical history as listed below who states that she is on palliative care due to her cancer (she describes it as "bone cancer" but she has vulvar cancer listed on her medical record).  She presents by  private vehicle for evaluation of nausea and vomiting as well as a few episodes of diarrhea for the last 24 hours.  She said that it started acutely and has been severe.  As result she has not been able to take her medications because she cannot keep anything down.  Nothing particular makes it better or worse.  She said that the nausea and the vomiting been the worst but she has also had 4 episodes of loose stool.  She is also having generalized aching abdominal pain all throughout her abdomen as well as some abdominal cramps.  She denies fever but has had some chills recently.  She denies shortness of breath, cough, and chest pain.  She has had some dysuria recently and she reports that the pain in her abdomen radiates through to her lower back.        Past Medical History:  Diagnosis Date  . Cancer ([place])    vulvular  . Cervical disc disease    a. 01/2018 MRI Cervical spine: Cervical spondylosis with multilevel disc and facet degeneration greatest at C5-6 and C6-7.  Moderate to sev R C5-6, mod L C5-6, and mod bilat C6-7 foraminal stenosis with multilevel mild foraminal stenosis.  Mild C5-6 and mod C6-7 canal stenosis. C6-7 central cord impingement w/ cord flattening.  . Chronic combined systolic (congestive) and diastolic (congestive) heart failure ([place])    a. 12/2017 Echo: EF 30-35%,  mid-apicalanteroseptal, ant, and apical AK. Gr1 DD. Mild conc LVH; b. 03/2018 Echo: EF 45-50%, antsept, ant HK. Mild MR. Nl LA size. Nl RV fxn.  Marland Kitchen COPD (chronic obstructive pulmonary disease) (HCC)    not on home oxygen  . Coronary artery disease    a. 12/2017 ACS/PCI: LAD 100p (2.25x26 Resolute Onyx DES), 4m (2.0x12 Resolute Onyx DES), 80d (2.0x15 Resolute Onyx DES), RCA 90p (non-dominant). EF 25-35%. Post-MI course complicated by CGS; b. 0/7371 NSTEMI/subacute thrombosis-->LAD 100 (PTCA + DES x 1); c. 01/2018 NSTEMI/PCI: LM min irregs, LAD 50-60p ISR/hazy (3.5x12 Resolute DES), 27m/d (underexpansion of prior stents-->PTCA). EF 45-50%.  Marland Kitchen GERD (gastroesophageal reflux disease)   . H/O degenerative disc disease   . Hypotension   . Ischemic cardiomyopathy    a. 12/2017 Echo: EF 30-35%; b. 03/2018 Echo: EF 45-50%.  . Myocardial infarction ([place])    a. 12/2017-->DES to LAD x 3.  . Pancreatitis   . Shingles   . Spinal stenosis   . Tobacco abuse   . Ulcer (traumatic) of oral mucosa   . Urinary incontinence   . VIN III (vulvar intraepithelial neoplasia III)     Patient Active Problem List   Diagnosis Date Noted  . Diabetic ketoacidosis ([place]) 03/30/2020  . DKA (diabetic ketoacidosis) ([place]) 03/30/2020  . Vulvar cancer ([place]) 07/27/2019  . Palliative care patient 09/04/2018  . Hyperglycemia 05/28/2018  . Vulvar intraepithelial neoplasia (VIN) grade 3 05/24/2018  . History of ST elevation myocardial  infarction (STEMI) 02/27/2018  . CAD (coronary artery disease) 02/27/2018  . Hyperlipidemia associated with type 2 diabetes mellitus ([place]) 02/27/2018  . Anxiety associated with depression 02/27/2018  . Chronic systolic heart failure ([place])   . Chest pain 12/26/2017  . Unstable angina ([place])   . Ischemic cardiomyopathy   . Former smoker   . Protein-calorie malnutrition, severe 07/18/2017  . DM (diabetes mellitus) ([place]) 12/28/2014  . Centrilobular emphysema ([place]) 12/28/2014  . GERD (gastroesophageal  reflux disease) 12/28/2014  . History of shingles 12/28/2014  . Hyperlipidemia LDL goal <70 12/28/2014    Past Surgical History:  Procedure Laterality Date  . ABDOMINAL HYSTERECTOMY     partial  . CORONARY BALLOON ANGIOPLASTY N/A 05/22/2018   Procedure: CORONARY BALLOON ANGIOPLASTY;  Surgeon: Wellington Hampshire, MD;  Location: [facility];  Service: Cardiovascular;  Laterality: N/A;  . CORONARY STENT INTERVENTION N/A 12/26/2017   Procedure: CORONARY STENT INTERVENTION;  Surgeon: Wellington Hampshire, MD;  Location: [facility];  Service: Cardiovascular;  Laterality: N/A;  . CORONARY STENT INTERVENTION N/A 02/08/2018   Procedure: CORONARY STENT INTERVENTION;  Surgeon: Nelva Bush, MD;  Location: [facility];  Service: Cardiovascular;  Laterality: N/A;  . CORONARY/GRAFT ACUTE MI REVASCULARIZATION N/A 12/19/2017   Procedure: Coronary/Graft Acute MI Revascularization;  Surgeon: Wellington Hampshire, MD;  Location: [facility];  Service: Cardiovascular;  Laterality: N/A;  . ECTOPIC PREGNANCY SURGERY Left   . INTRAVASCULAR ULTRASOUND/IVUS N/A 02/08/2018   Procedure: Intravascular Ultrasound/IVUS;  Surgeon: Nelva Bush, MD;  Location: [facility];  Service: Cardiovascular;  Laterality: N/A;  . LEFT HEART CATH AND CORONARY ANGIOGRAPHY N/A 12/19/2017   Procedure: LEFT HEART CATH AND CORONARY ANGIOGRAPHY;  Surgeon: Wellington Hampshire, MD;  Location: [facility];  Service: Cardiovascular;  Laterality: N/A;  . LEFT HEART CATH AND CORONARY ANGIOGRAPHY N/A 12/26/2017   Procedure: LEFT HEART CATH AND CORONARY ANGIOGRAPHY;  Surgeon: Wellington Hampshire, MD;  Location: [facility];  Service: Cardiovascular;  Laterality: N/A;  . LEFT HEART CATH AND CORONARY ANGIOGRAPHY N/A 02/08/2018   Procedure: LEFT HEART CATH AND CORONARY ANGIOGRAPHY;  Surgeon: Nelva Bush, MD;  Location: [facility];  Service: Cardiovascular;  Laterality: N/A;  . LEFT HEART  CATH AND CORONARY ANGIOGRAPHY N/A 05/22/2018   Procedure: LEFT HEART CATH AND CORONARY ANGIOGRAPHY;  Surgeon: Wellington Hampshire, MD;  Location: [facility];  Service: Cardiovascular;  Laterality: N/A;  . LEFT HEART CATH AND CORONARY ANGIOGRAPHY N/A 06/15/2018   Procedure: LEFT HEART CATH AND CORONARY ANGIOGRAPHY;  Surgeon: Wellington Hampshire, MD;  Location: [facility];  Service: Cardiovascular;  Laterality: N/A;  . LYMPHADENECTOMY    . SPLENECTOMY, PARTIAL    . vulvulasectomy      Prior to Admission medications   Medication Sig Start Date End Date Taking? Authorizing Provider  albuterol (PROAIR HFA) 108 (90 Base) MCG/ACT inhaler Inhale 1 puff into the lungs every 6 (six) hours as needed for wheezing or shortness of breath.    Yes [provider]  amitriptyline (ELAVIL) 25 MG tablet Take 1 tablet (25 mg total) by mouth at bedtime. Patient taking differently: Take 75 mg by mouth at bedtime. 04/05/20 05/05/20 Yes Shahmehdi, Valeria Batman, MD  aspirin 81 MG tablet Take 81 mg by mouth daily.   Yes [provider]  atorvastatin (LIPITOR) 80 MG tablet Take 1 tablet (80 mg total) by mouth daily at 6 PM. 03/05/19 03/30/20 Yes  Theora Gianotti, NP  clonazePAM (KLONOPIN) 1 MG tablet Take 1 mg by mouth 3 (three) times daily as needed. 03/19/20  Yes [provider]  docusate sodium (COLACE) 100 MG capsule Take 2 capsules (200 mg total) by mouth 2 (two) times daily. 04/05/20  Yes Shahmehdi, Valeria Batman, MD  esomeprazole (NEXIUM) 40 MG capsule Take 1 capsule (40 mg total) by mouth daily before breakfast. 09/22/20  Yes Karamalegos, Devonne Doughty, DO  famotidine (PEPCID) 20 MG tablet Take 1 tablet (20 mg total) by mouth 2 (two) times daily. 09/22/20  Yes Karamalegos, Devonne Doughty, DO  insulin isophane & regular human (NOVOLIN 70/30 FLEXPEN) (70-30) 100 UNIT/ML KwikPen Inject 18 Units into the skin 2 (two) times daily. 04/05/20  Yes Shahmehdi, Seyed A, MD  Lidocaine HCl 4 % GEL  Apply topically. 02/05/20  Yes [provider]  methocarbamol (ROBAXIN) 500 MG tablet Take 1 tablet by mouth 4 (four) times daily as needed. 08/01/20  Yes [provider]  mirtazapine (REMERON) 15 MG tablet Take 15 mg by mouth at bedtime. 07/07/18  Yes [provider]  nitroGLYCERIN (NITROSTAT) 0.4 MG SL tablet Place 1 tablet (0.4 mg total) under the tongue every 5 (five) minutes as needed for chest pain. 06/16/18  Yes Gouru, Aruna, MD  ondansetron (ZOFRAN-ODT) 4 MG disintegrating tablet Take 4 mg by mouth every 8 (eight) hours as needed. 11/27/19  Yes [provider]  oxyCODONE (OXYCONTIN) 80 mg 12 hr tablet Take 1 tablet by mouth every 8 (eight) hours.   Yes [provider]  oxycodone (ROXICODONE) 30 MG immediate release tablet Take 30 mg by mouth every 3 (three) hours as needed. 04/14/20  Yes [provider]  polyethylene glycol powder (GLYCOLAX/MIRALAX) 17 GM/SCOOP powder Take 17 g by mouth daily.  10/20/14  Yes [provider]  ranolazine (RANEXA) 500 MG 12 hr tablet Take 1 tablet (500 mg total) by mouth 2 (two) times daily. 04/05/20 05/05/20 Yes Shahmehdi, Valeria Batman, MD  SSD 1 % cream SMARTSIG:1 Topical 4-5 Times Daily 01/25/20  Yes [provider]  sucralfate (CARAFATE) 1 g tablet Take 1 g by mouth 4 (four) times daily. 03/01/20  Yes [provider]  ticagrelor (BRILINTA) 90 MG TABS tablet Take 1 tablet (90 mg total) by mouth 2 (two) times daily. *NEEDS OFFICE VISIT FOR FURTHER REFILLS-PLEASE CALL [phone] TO SCHEDULE.* 05/14/19  Yes Theora Gianotti, NP  BD VEO INSULIN SYRINGE U/F 31G X 15/64" 0.5 ML MISC use as directed 4 times daily Patient not taking: Reported on 04/17/2020 03/13/19   Olin Hauser, DO  Blood Glucose Monitoring Suppl (GLUCOCOM BLOOD GLUCOSE MONITOR) DEVI 1 m. 11/15/17   Parks Ranger, Devonne Doughty, DO  insulin aspart (NOVOLOG) 100 UNIT/ML injection Inject 0-9 Units into the skin 3  (three) times daily with meals. To check blood sugars q. ACHS, follow the sliding scale provided Unit of insulin per sliding scale insulin Patient not taking: No sig reported 04/05/20   Deatra James, MD  Lancet Devices (SIMPLE DIAGNOSTICS LANCING DEV) MISC AS DIRECTED 11/15/17   Parks Ranger, Devonne Doughty, DO  oxyCODONE-acetaminophen (PERCOCET/ROXICET) 5-325 MG tablet Take 1 tablet by mouth every 4 (four) hours as needed for severe pain. Patient not taking: No sig reported 09/18/20   Paulette Blanch, MD    Allergies Bee venom, Metformin and related, Darvon [propoxyphene], Gabapentin, Nsaids, Tramadol, and Contrast media [iodinated diagnostic agents]  Family History  Problem Relation Age of Onset  . Non-Hodgkin's lymphoma Mother   .  Osteoporosis Mother   . Lupus Sister   . Heart disease Brother   . Heart attack Brother   . Heart attack Father     Social History Social History   Tobacco Use  . Smoking status: Former Smoker    Packs/day: 0.50    Years: 30.00    Pack years: 15.00    Types: Cigarettes    Quit date: 12/19/2017    Years since quitting: 2.8  . Smokeless tobacco: Former Systems developer  . Tobacco comment: quit 12/19/2017.  Vaping Use  . Vaping Use: Never used  Substance Use Topics  . Alcohol use: No  . Drug use: No    Review of Systems Constitutional: No fever, some chills when she is sick. Eyes: No visual changes. ENT: No sore throat. Cardiovascular: Denies chest pain. Respiratory: Denies shortness of breath. Gastrointestinal: Positive for generalized abdominal pain rating to her back, nausea, vomiting, and diarrhea. Genitourinary: Positive for mild dysuria. Musculoskeletal: Negative for neck pain.  Pain in her lower back radiating from the abdomen. Integumentary: Negative for rash. Neurological: Negative for headaches, focal weakness or numbness.   ____________________________________________   PHYSICAL EXAM:  VITAL SIGNS: ED Triage Vitals  Enc Vitals Group      BP 10/10/20 0412 133/79     Pulse Rate 10/10/20 0412 94     Resp 10/10/20 0412 18     Temp 10/10/20 0412 98 F (36.7 C)     Temp Source 10/10/20 0412 Oral     SpO2 10/10/20 0412 100 %     Weight 10/10/20 0414 59 kg (130 lb)     Height 10/10/20 0414 1.549 m (5\' 1" )     Head Circumference --      Peak Flow --      Pain Score 10/10/20 0412 9     Pain Loc --      Pain Edu? --      Excl. in [place]? --     Constitutional: Alert and oriented.  Appears uncomfortable. Eyes: Conjunctivae are normal.  Head: Atraumatic. Nose: No congestion/rhinnorhea. Mouth/Throat: Patient is wearing a mask. Neck: No stridor.  No meningeal signs.   Cardiovascular: Normal rate, regular rhythm. Good peripheral circulation. Respiratory: Normal respiratory effort.  No retractions. Gastrointestinal: Soft and nondistended.  Patient reports some mild generalized tenderness to palpation throughout but without any localized focal tenderness/peritonitis. Musculoskeletal: No lower extremity tenderness nor edema. No gross deformities of extremities. Neurologic:  Normal speech and language. No gross focal neurologic deficits are appreciated.  Skin:  Skin is warm, dry and intact. Psychiatric: Mood and affect are normal. Speech and behavior are normal.  ____________________________________________   LABS (all labs ordered are listed, but only abnormal results are displayed)  Labs Reviewed  COMPREHENSIVE METABOLIC PANEL - Abnormal; Notable for the following components:      Result Value   Sodium 134 (*)    CO2 18 (*)    Glucose, Bld 310 (*)    Total Protein 9.5 (*)    AST 12 (*)    Alkaline Phosphatase 167 (*)    Anion gap 16 (*)    All other components within normal limits  URINALYSIS, COMPLETE (UACMP) WITH MICROSCOPIC - Abnormal; Notable for the following components:   Color, Urine YELLOW (*)    APPearance CLEAR (*)    Glucose, UA >=500 (*)    Ketones, ur 80 (*)    All other components within normal limits   CBC WITH DIFFERENTIAL/PLATELET - Abnormal; Notable for the following components:  RBC 5.21 (*)    Hemoglobin 16.3 (*)    HCT 48.9 (*)    Platelets 428 (*)    All other components within normal limits  LIPASE, BLOOD  BASIC METABOLIC PANEL   ____________________________________________   RADIOLOGY CT abdomen/pelvis w/ IV contrast pending at time of sign out.  ____________________________________________   PROCEDURES   Procedure(s) performed (including Critical Care):  Procedures   ____________________________________________   INITIAL IMPRESSION / MDM / [place] / ED COURSE  As part of my medical decision making, I reviewed the following data within the [facility] notes reviewed and incorporated, Labs reviewed , Old chart reviewed, Patient signed out to Dr. Joni Fears and reviewed Notes from prior ED visits   Differential diagnosis includes, but is not limited to, viral gastroenteritis, bacterial infection, UTI/pyelonephritis, complication of medications, narcotic withdrawal.  Patient has diffuse abdominal pain and tenderness but without peritonitis.  Her biggest issue seems to be the nausea and vomiting which is also preventing her from taking her chronic narcotics prescribed by palliative care.  Her vital signs are reassuring with no fever and normal blood pressure.  Labs pending including urinalysis.  Morphine 4 mg IV, Zofran 4 mg IV.     Clinical Course as of 10/10/20 0651  Fri Oct 10, 2020  0517 CBC with Differential/Platelet(!) CBC appears hemoconcentrated with no leukocytosis but hemoglobin is 16.3 and platelets of 428,000.  I ordered 1 L lactated Ringer's for rehydration given her recent GI volume losses. [CF]  [phone] Patient's urine is generally clear other than ketones and glucosuria.  Lipase is normal.  Comprehensive metabolic panel is notable for hyperglycemia and mildly elevated anion gap but I suspect this is volume related  rather than related to DKA.  Her CO2 is down to 18, however, and I believe she would benefit from additional hydration.  I ordered a second liter of lactated Ringer's.  Given her constellation of symptoms and comorbidities I think it is reasonable to proceed with a CT abdomen/pelvis with IV contrast.  Unfortunately she has reported allergy to IV contrast dye and I think she would benefit from the IV contrast so I ordered the protocol for emergent procedures including hydrocortisone 200 mg IV and Benadryl within an hour of her scan. [CF]  [id number] Patient says she is feeling better but still having occasional abdominal pain.  I talked to her about the allergic reaction preparation for the CT scan and she very much wants to proceed with imaging.  She understands the plan and that I will be transferring care to the oncoming physician (Dr. Joni Fears) at 7 AM. Hemodynamically stable and in no distress at this time. [CF]  [phone] Ordered repeat metabolic panel after completion of fluid boluses.  CT pending per allergic reaction protocol.  Will sign over care to Dr. Joni Fears. [CF]    Clinical Course User Index [CF] Hinda Kehr, MD     ____________________________________________  FINAL CLINICAL IMPRESSION(S) / ED DIAGNOSES  Final diagnoses:  Dehydration  Nausea vomiting and diarrhea  Generalized abdominal pain     MEDICATIONS GIVEN DURING THIS VISIT:  Medications  lactated ringers bolus 1,000 mL (has no administration in time range)  hydrocortisone sodium succinate (SOLU-CORTEF) injection 200 mg (has no administration in time range)  diphenhydrAMINE (BENADRYL) capsule 50 mg (has no administration in time range)    Or  diphenhydrAMINE (BENADRYL) injection 50 mg (has no administration in time range)  morphine 4 MG/ML injection 4 mg (4 mg Intravenous Given  10/10/20 0448)  ondansetron (ZOFRAN) injection 4 mg (4 mg Intravenous Given 10/10/20 0447)  lactated ringers bolus 1,000 mL (1,000 mLs Intravenous  New Bag/Given 10/10/20 0522)     ED Discharge Orders    None      *Please note:  Cathyrn Pinera was evaluated in Emergency Department on 10/10/2020 for the symptoms described in the history of present illness. She was evaluated in the context of the global COVID-19 pandemic, which necessitated consideration that the patient might be at risk for infection with the SARS-CoV-2 virus that causes COVID-19. Institutional protocols and algorithms that pertain to the evaluation of patients at risk for COVID-19 are in a state of rapid change based on information released by regulatory bodies including the CDC and federal and state organizations. These policies and algorithms were followed during the patient's care in the ED.  Some ED evaluations and interventions may be delayed as a result of limited staffing during and after the pandemic.*  Note:  This document was prepared using Dragon voice recognition software and may include unintentional dictation errors.   Hinda Kehr, MD 10/10/20 [phone]

## 2020-10-10 NOTE — H&P (Addendum)
History and Physical   Renee Mack [id number] DOB: 1965/12/05 DOA: 10/10/2020  PCP: Olin Hauser, DO  Outpatient Specialists: [facility] Patient coming from: Home  I have personally briefly reviewed patient's old medical records in [place].  Chief Concern: Nausea, vomiting, diarrhea  HPI: Renee Mack is a 55 y.o. female with medical history significant for stage III valvular intra epithelial carcinoma currently on palliative pain management, IDDM2, novolin 18 units in the AM and 18 units in the evening, CAD, GERD, COPD, history of pancreatitis, anxiety, chronic opioid use, heart failure reduced ejection fraction, hypertension, presents to the emergency department for chief concerns of nausea vomiting and diarrhea.  She states that she is on chronic pain medication and is usually constipated and when she had diarrhea she knew she something was wrong.  She endorses nausea, vomiting two days ago, and states she tried zofran and phenergan at home with relief. She endorses diarrhea, started 10/09/20. She endorses sore throat, running rose, dysuria. Dysuria, starteda bout two days ago.  She denies melena and bright red blood per rectum.  She denies coffee ground emesis and blood in her emesis.  She denies diet changes, sick contracts. No recent cruise. She denies recent antibiotic use. She was hospitalized about two months ago, in February for DKA and pneumonia.  Social history: lives by herself. Her husband passed away a few years. She endorses former tobacco use, quit 1 year ago, at her peak, 3 ppd, started at age 23 years old.   Vaccination: Per patient, she is vaccinated for COVID-19, 2 doses  ROS: Constitutional: no weight change, no fever ENT/Mouth: no sore throat, no rhinorrhea Eyes: no eye pain, no vision changes Cardiovascular: no chest pain, no dyspnea,  no edema, no palpitations Respiratory: no cough, no sputum, no  wheezing Gastrointestinal: + nausea, + vomiting, + diarrhea, no constipation Genitourinary: no urinary incontinence, no dysuria, no hematuria Musculoskeletal: no arthralgias, no myalgias Skin: no skin lesions, no pruritus, Neuro: + weakness, no loss of consciousness, no syncope Psych: no anxiety, no depression, + decrease appetite Heme/Lymph: no bruising, no bleeding  ED Course: Discussed with ED provider, patient requiring hospitalization for intractable nausea and vomiting.  Initially on presentation, she was found to have mildly elevated anion gap with ketones in her urine.  Meeting technical criteria for DKA however she was awake alert and oriented as ED provider thought this was secondary to dehydration rather than true DKA.  Given patient endorses compliance with insulin at home.  Gap closed with hydration.  Vitals in the emergency department during the time of admission for myself, temperature of 98, respiration rate of 16, blood pressure 108/52, heart rate 82, patient SPO2 of 95% on room air.  She is status post 2 L LR bolus per ED, diphenhydramine 50 mg capsule and IV, solu-cortex injection 200 mg IV, dilaudid 1 mg IV, morphine 4 mg IV, oxycodone 30 mg PO, oxycodone 80 mg PO per ED.  Assessment/Plan  Principal Problem:   Intractable vomiting with nausea Active Problems:   Centrilobular emphysema (HCC)   GERD (gastroesophageal reflux disease)   Protein-calorie malnutrition, severe   Former smoker   Chest pain   Chronic systolic heart failure (HCC)   History of ST elevation myocardial infarction (STEMI)   CAD (coronary artery disease)   Hyperlipidemia associated with type 2 diabetes mellitus (HCC)   Anxiety associated with depression   Vulvar intraepithelial neoplasia (VIN) grade 3   Hyperglycemia   Palliative care patient  Vulvar cancer ([place])   # Intractable nausea and vomiting with diarrhea # Gastroenteritis - As patient is immunocompromise cannot exclude infectious  diarrhea - C. difficile, GI panel ordered - Status post 2 L l lactated Ringer's per EDP - Given patient's history of heart failure reduced ejection fraction and 2 L lactated Ringer's, I will not initiate IVF at this time, encourage p.o. intake with as needed antinausea medication - Ondansetron as needed for nausea and vomiting, Phenergan IV as needed for intractable nausea and vomiting  # Heart failure reduced ejection fraction-resumed home digoxin 0.125 mg daily for 10/11/2020 - Digoxin was noted on telemedicine outpatient note in March 2022 - Holding metoprolol succinate 25 mg daily due to normal tensive dehydration from gastroenteritis - Imdur 30 mg daily resumed for [id number]  # Metabolic acidosis secondary to GI loss, treat as above, encourage p.o. intake  # Low normotensive- Midodrine 5 mg 3 times daily as needed for SBP less than 100  # Insulin-dependent diabetes mellitus- resumed home insulin dose of Novolin 18 units twice daily -While in the emergency department patient self dosed herself with 18 units of her home insulin - Patient is no longer taking Lantus or NovoLog - Insulin SSI with at bedtime coverage ordered  # Anxiety and depression-resumed home clonazepam 0.5 mg 3 times daily for anxiety, -Resumed amitriptyline 75 mg nightly -Resumed home mirtazapine 15 mg nightly  # Failure to thrive- resumed home mirtazapine 50 mg nightly  # Hyperlipidemia-atorvastatin 80 mg nightly  # CAD-resumed Brilinta 90 mg twice daily, aspirin 81 mg daily, atorvastatin 80 mg nightly, Imdur 30 mg daily, ranolazine 500 mg twice daily, nitroglycerin sublingual 0.4 mg sublingual every 5 minutes as needed for chest pain  # Stable angina- ranolazine 500 mg twice daily resumed  # GERD-pantoprazole 40 mg daily, famotidine 20 mg twice daily resumed  # COPD-resumed home inhalers with substitution for Dulera  # History of tobacco use-previously smoked 3 packs/day  # COVID screening test is ordered    Chart reviewed.   Hospitalization from 03/30/2020 to 04/05/2020: Admitted to PCCM service for DKA presenting for nausea and vomiting.  She was treated with insulin infusion and transition to subcutaneous insulin.  She was also treated with IV cefepime for 5 days for bibasilar pneumonia on imaging with elevated Pro-Cal.  She was then transition to p.o. Levaquin.  [id number]: Systolic function mildly reduced, estimated EF was 45 to 50%, hypokinesis of the anteroseptal myocardium.  Hypokinesis of the anterior myocardium.  Left ventricular diastolic function parameters were normal.  Mild regurgitation of the mitral valve.  Left atrium was normal in size.  Right ventricle systolic function was normal.  Systolic pulmonary artery pressure could not be estimated.  DVT prophylaxis: Enoxaparin 40 mg subcutaneous every 24 hours, TED hose Code Status: full code on palliative care for pain management  Diet: Heart healthy/carb modified Family Communication: No Disposition Plan: Pending clinical course Consults called: None at this time Admission status: MedSurg, observation, with telemetry  Past Medical History:  Diagnosis Date  . Cancer ([place])    vulvular  . Cervical disc disease    a. 01/2018 MRI Cervical spine: Cervical spondylosis with multilevel disc and facet degeneration greatest at C5-6 and C6-7.  Moderate to sev R C5-6, mod L C5-6, and mod bilat C6-7 foraminal stenosis with multilevel mild foraminal stenosis.  Mild C5-6 and mod C6-7 canal stenosis. C6-7 central cord impingement w/ cord flattening.  . Chronic combined systolic (congestive) and diastolic (congestive) heart failure (HCC)  a. 12/2017 Echo: EF 30-35%, mid-apicalanteroseptal, ant, and apical AK. Gr1 DD. Mild conc LVH; b. 03/2018 Echo: EF 45-50%, antsept, ant HK. Mild MR. Nl LA size. Nl RV fxn.  Marland Kitchen COPD (chronic obstructive pulmonary disease) (HCC)    not on home oxygen  . Coronary artery disease    a. 12/2017 ACS/PCI: LAD 100p (2.25x26  Resolute Onyx DES), 17m (2.0x12 Resolute Onyx DES), 80d (2.0x15 Resolute Onyx DES), RCA 90p (non-dominant). EF 25-35%. Post-MI course complicated by CGS; b. 123XX123 NSTEMI/subacute thrombosis-->LAD 100 (PTCA + DES x 1); c. 01/2018 NSTEMI/PCI: LM min irregs, LAD 50-60p ISR/hazy (3.5x12 Resolute DES), 28m/d (underexpansion of prior stents-->PTCA). EF 45-50%.  Marland Kitchen GERD (gastroesophageal reflux disease)   . H/O degenerative disc disease   . Hypotension   . Ischemic cardiomyopathy    a. 12/2017 Echo: EF 30-35%; b. 03/2018 Echo: EF 45-50%.  . Myocardial infarction ([place])    a. 12/2017-->DES to LAD x 3.  . Pancreatitis   . Shingles   . Spinal stenosis   . Tobacco abuse   . Ulcer (traumatic) of oral mucosa   . Urinary incontinence   . VIN III (vulvar intraepithelial neoplasia III)    Past Surgical History:  Procedure Laterality Date  . ABDOMINAL HYSTERECTOMY     partial  . CORONARY BALLOON ANGIOPLASTY N/A 05/22/2018   Procedure: CORONARY BALLOON ANGIOPLASTY;  Surgeon: Wellington Hampshire, MD;  Location: [place] [facility];  Service: Cardiovascular;  Laterality: N/A;  . CORONARY STENT INTERVENTION N/A 12/26/2017   Procedure: CORONARY STENT INTERVENTION;  Surgeon: Wellington Hampshire, MD;  Location: [place] [facility];  Service: Cardiovascular;  Laterality: N/A;  . CORONARY STENT INTERVENTION N/A 02/08/2018   Procedure: CORONARY STENT INTERVENTION;  Surgeon: Nelva Bush, MD;  Location: [place] [facility];  Service: Cardiovascular;  Laterality: N/A;  . CORONARY/GRAFT ACUTE MI REVASCULARIZATION N/A 12/19/2017   Procedure: Coronary/Graft Acute MI Revascularization;  Surgeon: Wellington Hampshire, MD;  Location: [place] [facility];  Service: Cardiovascular;  Laterality: N/A;  . ECTOPIC PREGNANCY SURGERY Left   . INTRAVASCULAR ULTRASOUND/IVUS N/A 02/08/2018   Procedure: Intravascular Ultrasound/IVUS;  Surgeon: Nelva Bush, MD;  Location: [place] [facility];  Service: Cardiovascular;  Laterality: N/A;   . LEFT HEART CATH AND CORONARY ANGIOGRAPHY N/A 12/19/2017   Procedure: LEFT HEART CATH AND CORONARY ANGIOGRAPHY;  Surgeon: Wellington Hampshire, MD;  Location: [place] [facility];  Service: Cardiovascular;  Laterality: N/A;  . LEFT HEART CATH AND CORONARY ANGIOGRAPHY N/A 12/26/2017   Procedure: LEFT HEART CATH AND CORONARY ANGIOGRAPHY;  Surgeon: Wellington Hampshire, MD;  Location: [place] [facility];  Service: Cardiovascular;  Laterality: N/A;  . LEFT HEART CATH AND CORONARY ANGIOGRAPHY N/A 02/08/2018   Procedure: LEFT HEART CATH AND CORONARY ANGIOGRAPHY;  Surgeon: Nelva Bush, MD;  Location: [place] [facility];  Service: Cardiovascular;  Laterality: N/A;  . LEFT HEART CATH AND CORONARY ANGIOGRAPHY N/A 05/22/2018   Procedure: LEFT HEART CATH AND CORONARY ANGIOGRAPHY;  Surgeon: Wellington Hampshire, MD;  Location: [place] [facility];  Service: Cardiovascular;  Laterality: N/A;  . LEFT HEART CATH AND CORONARY ANGIOGRAPHY N/A 06/15/2018   Procedure: LEFT HEART CATH AND CORONARY ANGIOGRAPHY;  Surgeon: Wellington Hampshire, MD;  Location: [place] [facility];  Service: Cardiovascular;  Laterality: N/A;  . LYMPHADENECTOMY    . SPLENECTOMY, PARTIAL    . vulvulasectomy     Social History:  reports that she quit smoking about 2 years ago. Her smoking use included cigarettes.  She has a 15.00 pack-year smoking history. She has quit using smokeless tobacco. She reports that she does not drink alcohol and does not use drugs.  Allergies  Allergen Reactions  . Bee Venom Itching, Shortness Of Breath and Swelling  . Metformin And Related Shortness Of Breath and Swelling  . Darvon [Propoxyphene] Itching  . Gabapentin Swelling  . Nsaids Other (See Comments)    Ulcers   . Tramadol Hives  . Contrast Media [Iodinated Diagnostic Agents] Rash    If you benadryl, and steroids she is able to take the contrast per pt   Family History  Problem Relation Age of Onset  . Non-Hodgkin's lymphoma Mother   .  Osteoporosis Mother   . Lupus Sister   . Heart disease Brother   . Heart attack Brother   . Heart attack Father    Family history: Family history reviewed and not pertinent  Prior to Admission medications   Medication Sig Start Date End Date Taking? Authorizing Provider  albuterol (PROAIR HFA) 108 (90 Base) MCG/ACT inhaler Inhale 1 puff into the lungs every 6 (six) hours as needed for wheezing or shortness of breath.    Yes [provider]  amitriptyline (ELAVIL) 25 MG tablet Take 1 tablet (25 mg total) by mouth at bedtime. Patient taking differently: Take 75 mg by mouth at bedtime. 04/05/20 05/05/20 Yes Shahmehdi, Valeria Batman, MD  aspirin 81 MG tablet Take 81 mg by mouth daily.   Yes [provider]  atorvastatin (LIPITOR) 80 MG tablet Take 1 tablet (80 mg total) by mouth daily at 6 PM. 03/05/19 03/30/20 Yes Theora Gianotti, NP  clonazePAM (KLONOPIN) 1 MG tablet Take 1 mg by mouth 3 (three) times daily as needed. 03/19/20  Yes [provider]  docusate sodium (COLACE) 100 MG capsule Take 2 capsules (200 mg total) by mouth 2 (two) times daily. 04/05/20  Yes Shahmehdi, Valeria Batman, MD  esomeprazole (NEXIUM) 40 MG capsule Take 1 capsule (40 mg total) by mouth daily before breakfast. 09/22/20  Yes Karamalegos, Devonne Doughty, DO  famotidine (PEPCID) 20 MG tablet Take 1 tablet (20 mg total) by mouth 2 (two) times daily. 09/22/20  Yes Karamalegos, Devonne Doughty, DO  insulin isophane & regular human (NOVOLIN 70/30 FLEXPEN) (70-30) 100 UNIT/ML KwikPen Inject 18 Units into the skin 2 (two) times daily. 04/05/20  Yes Shahmehdi, Seyed A, MD  Lidocaine HCl 4 % GEL Apply topically. 02/05/20  Yes [provider]  methocarbamol (ROBAXIN) 500 MG tablet Take 1 tablet by mouth 4 (four) times daily as needed. 08/01/20  Yes [provider]  mirtazapine (REMERON) 15 MG tablet Take 15 mg by mouth at bedtime. 07/07/18  Yes [provider]  nitroGLYCERIN (NITROSTAT) 0.4 MG  SL tablet Place 1 tablet (0.4 mg total) under the tongue every 5 (five) minutes as needed for chest pain. 06/16/18  Yes Gouru, Aruna, MD  ondansetron (ZOFRAN-ODT) 4 MG disintegrating tablet Take 4 mg by mouth every 8 (eight) hours as needed. 11/27/19  Yes [provider]  oxyCODONE (OXYCONTIN) 80 mg 12 hr tablet Take 1 tablet by mouth every 8 (eight) hours.   Yes [provider]  oxycodone (ROXICODONE) 30 MG immediate release tablet Take 30 mg by mouth every 3 (three) hours as needed. 04/14/20  Yes [provider]  polyethylene glycol powder (GLYCOLAX/MIRALAX) 17 GM/SCOOP powder Take 17 g by mouth daily.  10/20/14  Yes [provider]  ranolazine (RANEXA) 500 MG 12 hr tablet Take 1 tablet (  500 mg total) by mouth 2 (two) times daily. 04/05/20 05/05/20 Yes Shahmehdi, Valeria Batman, MD  SSD 1 % cream SMARTSIG:1 Topical 4-5 Times Daily 01/25/20  Yes [provider]  sucralfate (CARAFATE) 1 g tablet Take 1 g by mouth 4 (four) times daily. 03/01/20  Yes [provider]  ticagrelor (BRILINTA) 90 MG TABS tablet Take 1 tablet (90 mg total) by mouth 2 (two) times daily. *NEEDS OFFICE VISIT FOR FURTHER REFILLS-PLEASE CALL [phone] TO SCHEDULE.* 05/14/19  Yes Theora Gianotti, NP  BD VEO INSULIN SYRINGE U/F 31G X 15/64" 0.5 ML MISC use as directed 4 times daily Patient not taking: Reported on 04/17/2020 03/13/19   Olin Hauser, DO  Blood Glucose Monitoring Suppl (GLUCOCOM BLOOD GLUCOSE MONITOR) DEVI 1 m. 11/15/17   Parks Ranger, Devonne Doughty, DO  insulin aspart (NOVOLOG) 100 UNIT/ML injection Inject 0-9 Units into the skin 3 (three) times daily with meals. To check blood sugars q. ACHS, follow the sliding scale provided Unit of insulin per sliding scale insulin Patient not taking: No sig reported 04/05/20   Deatra James, MD  Lancet Devices (SIMPLE DIAGNOSTICS LANCING DEV) MISC AS DIRECTED 11/15/17   Parks Ranger, Devonne Doughty, DO   oxyCODONE-acetaminophen (PERCOCET/ROXICET) 5-325 MG tablet Take 1 tablet by mouth every 4 (four) hours as needed for severe pain. Patient not taking: No sig reported 09/18/20   Paulette Blanch, MD   Physical Exam: Vitals:   10/10/20 1430 10/10/20 1600 10/10/20 1834 10/10/20 1957  BP: (!) 108/52 107/61 (!) 102/55 (!) 101/56  Pulse: 87 79 74 70  Resp: 16 16 18 17   Temp:  97.9 F (36.6 C) 98.5 F (36.9 C) 98 F (36.7 C)  TempSrc:  Oral Oral Oral  SpO2: 96% 98% 98% 99%  Weight:      Height:       Constitutional: appears older than chronological age, cachectic, frail, NAD, calm, comfortable Eyes: PERRL, lids and conjunctivae normal ENMT: Mucous membranes are moist. Posterior pharynx clear of any exudate or lesions. Age-appropriate dentition. Hearing appropriate Neck: normal, supple, no masses, no thyromegaly Respiratory: clear to auscultation bilaterally, no wheezing, no crackles. Normal respiratory effort. No accessory muscle use.  Cardiovascular: Regular rate and rhythm, no murmurs / rubs / gallops. No extremity edema. 2+ pedal pulses. No carotid bruits.  Abdomen: Scaphoid abdomen, no tenderness, no masses palpated, no hepatosplenomegaly. Bowel sounds positive.  Musculoskeletal: no clubbing / cyanosis. No joint deformity upper and lower extremities. Good ROM, no contractures, no atrophy. Normal muscle tone.  Skin: no rashes, lesions, ulcers. No induration Neurologic: Sensation intact. Strength 5/5 in all 4.  Psychiatric: Normal judgment and insight. Alert and oriented x 3. Normal mood.   EKG: Not indicated  Chest x-ray on Admission: I personally reviewed and I agree with radiologist reading as below.  CT ABDOMEN PELVIS W CONTRAST  Result Date: 10/10/2020 CLINICAL DATA:  Nausea and diarrhea.  Abdominal pain. EXAM: CT ABDOMEN AND PELVIS WITH CONTRAST TECHNIQUE: Multidetector CT imaging of the abdomen and pelvis was performed using the standard protocol following bolus administration of  intravenous contrast. CONTRAST:  158mL OMNIPAQUE IOHEXOL 300 MG/ML  SOLN COMPARISON:  03/30/2020 FINDINGS: Lower chest: Left anterior descending coronary artery atherosclerosis. Descending thoracic aortic atherosclerosis. Hepatobiliary: Dilated common bile duct at 1.0 cm in diameter, stable from 08/28/2018, cause for the extrahepatic biliary dilatation uncertain. No significant intrahepatic biliary dilatation. No focal hepatic parenchymal lesion identified. The gallbladder appears unremarkable. Pancreas: Unremarkable Spleen: Unremarkable Adrenals/Urinary Tract: Both adrenal glands appear normal.  Mild scarring in the left kidney upper pole. Vascular calcification in the left renal hilum. Mild right hydronephrosis and proximal hydroureter extending to the iliac vessel cross over. The ureters difficult to identify distal to this point partially due to mass effect from the distended urinary bladder. No distal hydroureter is identified. There is some calcifications in the vicinity of the mid ureter but these are probably vascular. The bladder measures 15.8 by 12.1 by 12.0 cm (volume = 1200 cm^3). Stomach/Bowel: No dilated bowel. Normal appendix. Suspected wall thickening in the proximal and transverse colon (although nondistention could be contributory), suspicious for proximal colitis. Vascular/Lymphatic: Aortoiliac atherosclerotic vascular disease. Reproductive: Uterus absent. Adnexa unremarkable. Soft tissue prominence along the vulva and vaginal vestibule bilaterally. Other: No supplemental non-categorized findings. Musculoskeletal: 4 mm degenerative anterolisthesis at L4-5 with lumbar spondylosis and degenerative disc disease contributing to left greater than right foraminal impingement at L4-5 and probable mild central narrowing of the thecal sac at this level. IMPRESSION: 1. Wall thickening in the ascending and transverse colon potentially reflecting proximal colitis. 2. Mild right hydronephrosis and proximal  hydroureter extending to the iliac vessel cross over. No definite hydroureter distal to this point. Distended urinary bladder with volume of 1200 cubic cm. 3. Soft tissue prominence along the vulva and vaginal vestibule bilaterally with some ill definition of tissue planes. This is nonspecific but could well be inflammatory. Correlation with at least visual inspection of the region suggested. 4. Other imaging findings of potential clinical significance: Aortic Atherosclerosis (ICD10-I70.0). Coronary atherosclerosis. Stable mild prominence of the extrahepatic biliary tree. Lumbar impingement at L4-5 due to spondylosis and degenerative disc disease. Electronically Signed   By: Van Clines M.D.   On: 10/10/2020 12:20   DG Chest Port 1 View  Result Date: 10/10/2020 CLINICAL DATA:  Nausea vomiting diarrhea EXAM: PORTABLE CHEST 1 VIEW COMPARISON:  09/18/2020, 08/29/2018, 03/30/2020 FINDINGS: Patchy atelectasis or scarring at the lingula and left base. Mild diffuse bronchitic changes likely chronic. No consolidation or pleural effusion. Stable cardiomediastinal silhouette with aortic atherosclerosis. Probable coronary stent. IMPRESSION: Patchy atelectasis or scarring at the lingula and left base. Chronic bronchitic changes Electronically Signed   By: Donavan Foil M.D.   On: 10/10/2020 15:55   Labs on Admission: I have personally reviewed following labs  CBC: Recent Labs  Lab 10/10/20 0445  WBC 6.7  NEUTROABS 4.8  HGB 16.3*  HCT 48.9*  MCV 93.9  PLT [id number]*   Basic Metabolic Panel: Recent Labs  Lab 10/10/20 0445 10/10/20 1045  NA 134* 134*  K 3.9 4.2  CL 100 104  CO2 18* 16*  GLUCOSE 310* 314*  BUN 9 8  CREATININE 0.69 0.62  CALCIUM 9.9 8.9   GFR: Estimated Creatinine Clearance: 64.8 mL/min (by C-G formula based on SCr of 0.62 mg/dL).  Liver Function Tests: Recent Labs  Lab 10/10/20 0445  AST 12*  ALT 13  ALKPHOS 167*  BILITOT 1.2  PROT 9.5*  ALBUMIN 4.2   Recent Labs   Lab 10/10/20 0445  LIPASE 20   HbA1C: Recent Labs    10/10/20 0445  HGBA1C 10.4*   CBG: Recent Labs  Lab 10/10/20 2114  GLUCAP 246*   Urine analysis:    Component Value Date/Time   COLORURINE YELLOW (A) 10/10/2020 0521   APPEARANCEUR CLEAR (A) 10/10/2020 0521   APPEARANCEUR Hazy 04/19/2014 0925   LABSPEC 1.012 10/10/2020 0521   LABSPEC 1.006 04/19/2014 0925   PHURINE 6.0 10/10/2020 0521   GLUCOSEU >=500 (A) 10/10/2020 [id number]  GLUCOSEU >=500 04/19/2014 0925   HGBUR NEGATIVE 10/10/2020 0521   BILIRUBINUR NEGATIVE 10/10/2020 0521   BILIRUBINUR Negative 04/19/2014 0925   KETONESUR 80 (A) 10/10/2020 0521   PROTEINUR NEGATIVE 10/10/2020 0521   NITRITE NEGATIVE 10/10/2020 0521   LEUKOCYTESUR NEGATIVE 10/10/2020 0521   LEUKOCYTESUR 3+ 04/19/2014 0925   Tayte Mcwherter N Elya Diloreto D.O. [facility]  If 7PM-7AM, please contact overnight-coverage provider If 7AM-7PM, please contact day coverage provider [email or web address]  10/10/2020, 9:29 PM

## 2020-10-10 NOTE — ED Notes (Signed)
Patient reports pain, and reports pain is chronic bone pain r/t cancer. Patient also reports anxiety r/t pain. Patient provided PRN pain medication and anxiety medication, see MAR.

## 2020-10-10 NOTE — ED Notes (Signed)
Per CT give benadryl at 1045

## 2020-10-10 NOTE — Progress Notes (Signed)
This 55 yo WF pt was admitted to 219 from the ED d/t N&V and diarrhea.  VSS on arrival.  Pt c/o pain a 7 on 1-10 scale and states that she need her oxycontin.  Will give to pt as soon as it is available to be removed from the Pyxis. PMHx: Vulvular CA (pallitiave care pt), COPD, CAD, CHF, cervical disc disease, GERD, hypotension, pancreatitis, left BKA, and DM. Pt oriented to room and admission profile completed.

## 2020-10-10 NOTE — ED Notes (Signed)
Pt assisted to bedside toilet, steady gait. Assisted back to bed. Pt requests pain and nausea medication, message sent to attending Dr. Joni Fears. Bed locked in low position with wheels locked, side rails up x2, call light in reach.

## 2020-10-10 NOTE — ED Provider Notes (Signed)
Procedures  Clinical Course as of 10/10/20 1511  Fri Oct 10, 2020  0517 CBC with Differential/Platelet(!) CBC appears hemoconcentrated with no leukocytosis but hemoglobin is 16.3 and platelets of 428,000.  I ordered 1 L lactated Ringer's for rehydration given her recent GI volume losses. [CF]  [phone] Patient's urine is generally clear other than ketones and glucosuria.  Lipase is normal.  Comprehensive metabolic panel is notable for hyperglycemia and mildly elevated anion gap but I suspect this is volume related rather than related to DKA.  Her CO2 is down to 18, however, and I believe she would benefit from additional hydration.  I ordered a second liter of lactated Ringer's.  Given her constellation of symptoms and comorbidities I think it is reasonable to proceed with a CT abdomen/pelvis with IV contrast.  Unfortunately she has reported allergy to IV contrast dye and I think she would benefit from the IV contrast so I ordered the protocol for emergent procedures including hydrocortisone 200 mg IV and Benadryl within an hour of her scan. [CF]  8127 Patient says she is feeling better but still having occasional abdominal pain.  I talked to her about the allergic reaction preparation for the CT scan and she very much wants to proceed with imaging.  She understands the plan and that I will be transferring care to the oncoming physician (Dr. Joni Fears) at 7 AM. Hemodynamically stable and in no distress at this time. [CF]  [phone] Ordered repeat metabolic panel after completion of fluid boluses.  CT pending per allergic reaction protocol.  Will sign over care to Dr. Joni Fears. [CF]    Clinical Course User Index [CF] Hinda Kehr, MD    ----------------------------------------- 3:10 PM on 10/10/2020 ----------------------------------------- CT scan reveals colitis.  Patient still having nausea vomiting and severe pain.  She was able to take her chronic oral opioids including OxyContin and 30 mg oxycodone.   Given her comorbidities, will give IV Zosyn and admit to hospitalist for continued symptom management and hydration.    Carrie Mew, MD 10/10/20 [phone]

## 2020-10-10 NOTE — ED Notes (Signed)
Ok to transport patient per Freda Munro, Therapist, sports. Transport requested.

## 2020-10-10 NOTE — ED Notes (Signed)
Assigned as Primary RN at this time. Patient is lying comfortably and in no acute distress. Awaiting CT scan. Will continue to monitor and assess.

## 2020-10-10 NOTE — ED Triage Notes (Signed)
Pt arrived via POV with reports of N/V/D since Thursday AM along with lower abdominal pain and low back pain.  Pt states she is unable to keep gingerale down.

## 2020-10-10 NOTE — ED Notes (Signed)
Nira Conn, RN aware of verbal ok to transport per inpatient RN via secure chat.

## 2020-10-10 NOTE — ED Notes (Signed)
Report received from [place], [place]. Patient updated on pending inpatient admission. Patient denies needs at this time.

## 2020-10-10 NOTE — ED Notes (Signed)
Provider (Dr. Tobie Poet) at bedside.

## 2020-10-10 NOTE — ED Notes (Signed)
Bladder scan volume: 246mL

## 2020-10-11 DIAGNOSIS — F418 Other specified anxiety disorders: Secondary | ICD-10-CM

## 2020-10-11 DIAGNOSIS — I5022 Chronic systolic (congestive) heart failure: Secondary | ICD-10-CM | POA: Diagnosis not present

## 2020-10-11 DIAGNOSIS — I25118 Atherosclerotic heart disease of native coronary artery with other forms of angina pectoris: Secondary | ICD-10-CM | POA: Diagnosis not present

## 2020-10-11 DIAGNOSIS — R112 Nausea with vomiting, unspecified: Secondary | ICD-10-CM | POA: Diagnosis not present

## 2020-10-11 DIAGNOSIS — E860 Dehydration: Secondary | ICD-10-CM | POA: Diagnosis not present

## 2020-10-11 LAB — GLUCOSE, CAPILLARY
Glucose-Capillary: 356 mg/dL — ABNORMAL HIGH (ref 70–99)
Glucose-Capillary: 198 mg/dL — ABNORMAL HIGH (ref 70–99)
Glucose-Capillary: 177 mg/dL — ABNORMAL HIGH (ref 70–99)
Glucose-Capillary: 172 mg/dL — ABNORMAL HIGH (ref 70–99)

## 2020-10-11 LAB — BASIC METABOLIC PANEL
Anion gap: 10 (ref 5–15)
BUN: 18 mg/dL (ref 6–20)
CO2: 25 mmol/L (ref 22–32)
Calcium: 9.6 mg/dL (ref 8.9–10.3)
Chloride: 101 mmol/L (ref 98–111)
Creatinine, Ser: 0.75 mg/dL (ref 0.44–1.00)
GFR, Estimated: >60 mL/min (ref >60)
Glucose, Bld: 350 mg/dL — ABNORMAL HIGH (ref 70–99)
Potassium: 3.8 mmol/L (ref 3.5–5.1)
Sodium: 136 mmol/L (ref 135–145)

## 2020-10-11 LAB — CBC
HCT: 40.2 % (ref 36.0–46.0)
Hemoglobin: 13.6 g/dL (ref 12.0–15.0)
MCH: 31.8 pg (ref 26.0–34.0)
MCHC: 33.8 g/dL (ref 30.0–36.0)
MCV: 93.9 fL (ref 80.0–100.0)
Platelets: 351 10*3/uL (ref 150–400)
RBC: 4.28 MIL/uL (ref 3.87–5.11)
RDW: 13.2 % (ref 11.5–15.5)
WBC: 9.5 10*3/uL (ref 4.0–10.5)
nRBC: 0 % (ref 0.0–0.2)

## 2020-10-11 LAB — SARS CORONAVIRUS 2 (TAT 6-24 HRS): SARS Coronavirus 2: NEGATIVE

## 2020-10-11 MED ORDER — GUAIFENESIN ER 600 MG PO TB12
600.0000 mg | ORAL_TABLET | Freq: Two times a day (BID) | ORAL | Status: DC
  Administered 2020-10-11 – 2020-10-12 (×2): 600 mg via ORAL
  Filled 2020-10-11 (×2): qty 1

## 2020-10-11 MED ORDER — CLONAZEPAM 1 MG PO TABS
1.0000 mg | ORAL_TABLET | Freq: Three times a day (TID) | ORAL | Status: DC | PRN
  Administered 2020-10-11 – 2020-10-12 (×2): 1 mg via ORAL
  Filled 2020-10-11 (×2): qty 1

## 2020-10-11 NOTE — TOC Initial Note (Signed)
Transition of Care [facility]) - Initial/Assessment Note    Patient Details  Name: Renee Mack MRN: [id number] Date of Birth: August 23, 1965  [facility] of [facility]) CM/SW Contact:    Magnus Ivan, LCSW Phone Number: 10/11/2020, 2:54 PM  Clinical Narrative:                Patient on Enteric isolation. Spoke with patient via phone for High Risk Assessment. Patient lives alone and drives herself to appointments. PCP is Dr. Parks Ranger. Pharmacy is [facility]ediatric nurse on [facility]. Patient has a RW. No HH or DME history. Patient reported she has a ride home for when she is discharged and denied TOC needs at this time.   Expected Discharge Plan: Home/Self Care Barriers to Discharge: Continued Medical Work up   Patient Goals and CMS Choice Patient states their goals for this hospitalization and ongoing recovery are:: to return home CMS Medicare.gov Compare Post Acute Care list provided to:: Patient Choice offered to / list presented to : Patient  Expected Discharge Plan and Services Expected Discharge Plan: Home/Self Care       Living arrangements for the past 2 months: Single Family Home                                      Prior Living Arrangements/Services Living arrangements for the past 2 months: Single Family Home Lives with:: Self Patient language and need for interpreter reviewed:: Yes Do you feel safe going back to the place where you live?: Yes      Need for Family Participation in Patient Care: Yes (Comment) Care giver support system in place?: Yes (comment) Current home services: DME Criminal Activity/Legal Involvement Pertinent to Current Situation/Hospitalization: No - Comment as needed  Activities of Daily Living Home Assistive Devices/Equipment: Walker (specify type),Shower chair without back ADL Screening (condition at time of admission) Patient's cognitive ability adequate to safely complete daily activities?: Yes Is the patient deaf or have difficulty  hearing?: No Does the patient have difficulty seeing, even when wearing glasses/contacts?: No Does the patient have difficulty concentrating, remembering, or making decisions?: No Patient able to express need for assistance with ADLs?: Yes Does the patient have difficulty dressing or bathing?: Yes Independently performs ADLs?: Yes (appropriate for developmental age) Does the patient have difficulty walking or climbing stairs?: Yes Weakness of Legs: Both Weakness of Arms/Hands: None  Permission Sought/Granted                  Emotional Assessment       Orientation: : Oriented to Self,Oriented to Place,Oriented to  Time,Oriented to Situation Alcohol / Substance Use: Not Applicable Psych Involvement: No (comment)  Admission diagnosis:  Cough [R05.9] Dehydration [E86.0] Colitis [K52.9] Generalized abdominal pain [R10.84] Congestion of nasal sinus [F64.33] Uncomplicated opioid dependence (HCC) [F11.20] Nausea vomiting and diarrhea [R11.2, R19.7] Chest pain [R07.9] Intractable vomiting with nausea [R11.2] Patient Active Problem List   Diagnosis Date Noted  . Intractable vomiting with nausea 10/10/2020  . Diabetic ketoacidosis ([place]) 03/30/2020  . DKA (diabetic ketoacidosis) ([place]) 03/30/2020  . Vulvar cancer ([place]) 07/27/2019  . Palliative care patient 09/04/2018  . Hyperglycemia 05/28/2018  . Vulvar intraepithelial neoplasia (VIN) grade 3 05/24/2018  . History of ST elevation myocardial infarction (STEMI) 02/27/2018  . CAD (coronary artery disease) 02/27/2018  . Hyperlipidemia associated with type 2 diabetes mellitus ([place]) 02/27/2018  . Anxiety associated with depression 02/27/2018  . Chronic  systolic heart failure ([place])   . Chest pain 12/26/2017  . Unstable angina ([place])   . Ischemic cardiomyopathy   . Former smoker   . Protein-calorie malnutrition, severe 07/18/2017  . DM (diabetes mellitus) ([place]) 12/28/2014  . Centrilobular emphysema ([place]) 12/28/2014  . GERD  (gastroesophageal reflux disease) 12/28/2014  . History of shingles 12/28/2014  . Hyperlipidemia LDL goal <70 12/28/2014   PCP:  Olin Hauser, DO Pharmacy:   [facility] [address]), [place] [address] [place]) [place] [address] Phone: [phone] Fax: [phone]     Social Determinants of Health (SDOH) Interventions    Readmission Risk Interventions Readmission Risk Prevention Plan 10/11/2020 04/01/2020  Transportation Screening Complete Complete  PCP or Specialist Appt within 3-5 Days Complete Complete  HRI or Home Care Consult Complete -  Social Work Consult for [place] Planning/Counseling Complete Complete  Palliative Care Screening Complete Not Applicable  Medication Review Press photographer) Complete Complete  Some recent data might be hidden

## 2020-10-11 NOTE — Progress Notes (Signed)
1        [place] [facility] [place] NAME: Renee Mack    MR#:  [id number]  DATE OF BIRTH:  Feb 29, 1966  SUBJECTIVE:  CHIEF COMPLAINT:   Chief Complaint  Patient presents with  . Abdominal Pain  . Emesis  . Diarrhea  Has not had any further vomiting or diarrhea although feels very weak and not sure if she would be able to tolerate diet without IV Zofran REVIEW OF SYSTEMS:  Review of Systems  Constitutional: Positive for malaise/fatigue. Negative for diaphoresis, fever and weight loss.  HENT: Negative for ear discharge, ear pain, hearing loss, nosebleeds, sore throat and tinnitus.   Eyes: Negative for blurred vision and pain.  Respiratory: Negative for cough, hemoptysis, shortness of breath and wheezing.   Cardiovascular: Negative for chest pain, palpitations, orthopnea and leg swelling.  Gastrointestinal: Positive for diarrhea, nausea and vomiting. Negative for abdominal pain, blood in stool, constipation and heartburn.  Genitourinary: Negative for dysuria, frequency and urgency.  Musculoskeletal: Negative for back pain and myalgias.  Skin: Negative for itching and rash.  Neurological: Negative for dizziness, tingling, tremors, focal weakness, seizures, weakness and headaches.  Psychiatric/Behavioral: Negative for depression. The patient is not nervous/anxious.    DRUG ALLERGIES:   Allergies  Allergen Reactions  . Bee Venom Itching, Shortness Of Breath and Swelling  . Metformin And Related Shortness Of Breath and Swelling  . Darvon [Propoxyphene] Itching  . Gabapentin Swelling  . Nsaids Other (See Comments)    Ulcers   . Tramadol Hives  . Contrast Media [Iodinated Diagnostic Agents] Rash    If you benadryl, and steroids she is able to take the contrast per pt   VITALS:  Blood pressure 107/65, pulse (!) 104, temperature 97.8 F (36.6 C), temperature source Oral, resp. rate 18, height 5\' 1"  (1.549 m), weight 59 kg, SpO2 96 %. PHYSICAL EXAMINATION:   Physical Exam  55 year old frail and cachectic female laying in the bed comfortably without any acute distress.  She looks much older than her chronological age Eyes pupils equal round reactive light accommodation no scleral icterus Lungs clear to auscultation bilaterally no wheezing rales rhonchi crepitation Cardiovascular S1-S2 normal no murmur rales or gallop, tachycardic Abdomen soft nontender nondistended bowel sounds present Neuro alert and oriented, nonfocal Skin no rash or lesion LABORATORY PANEL:  Female CBC Recent Labs  Lab 10/11/20 0403  WBC 9.5  HGB 13.6  HCT 40.2  PLT 351   ------------------------------------------------------------------------------------------------------------------ Chemistries  Recent Labs  Lab 10/10/20 0445 10/10/20 1045 10/11/20 0403  NA 134*   < > 136  K 3.9   < > 3.8  CL 100   < > 101  CO2 18*   < > 25  GLUCOSE 310*   < > 350*  BUN 9   < > 18  CREATININE 0.69   < > 0.75  CALCIUM 9.9   < > 9.6  AST 12*  --   --   ALT 13  --   --   ALKPHOS 167*  --   --   BILITOT 1.2  --   --    < > = values in this interval not displayed.   RADIOLOGY:  DG Chest Port 1 View  Result Date: 10/10/2020 CLINICAL DATA:  Nausea vomiting diarrhea EXAM: PORTABLE CHEST 1 VIEW COMPARISON:  09/18/2020, 08/29/2018, 03/30/2020 FINDINGS: Patchy atelectasis or scarring at the lingula and left base. Mild diffuse bronchitic changes likely chronic. No consolidation or pleural effusion. Stable  cardiomediastinal silhouette with aortic atherosclerosis. Probable coronary stent. IMPRESSION: Patchy atelectasis or scarring at the lingula and left base. Chronic bronchitic changes Electronically Signed   By: Donavan Foil M.D.   On: 10/10/2020 15:55   ASSESSMENT AND PLAN:  55 y.o. female with medical history significant for stage III valvular intra epithelial carcinoma currently on palliative pain management, IDDM2, novolin 18 units in the AM and 18 units in the evening, CAD,  GERD, COPD, history of pancreatitis, anxiety, chronic opioid use, heart failure reduced ejection fraction, hypertension admitted for nausea vomiting and diarrhea  # Intractable nausea and vomiting with diarrhea -present on admission and now resolved - Patient is still using IV Zofran so hard to confirm although she denies any vomiting or diarrhea while here.  She is still not feeling comfortable going home and requesting to stay off IV Zofran to make sure she does not throw up.  I have requested to stop IV Zofran and watch her overnight to make sure she can tolerate diet Likely viral Gastroenteritis as patient is immunocompromise cannot exclude infectious diarrhea - C. difficile, GI panel ordered patient has had no stool here -  Symptomatic management for now.  Avoid IV Zofran or Phenergan to make sure she is able to manage herself at home on oral regimen  # Heart failure reduced ejection fraction- well compensated at this time.  # Metabolic acidosis secondary to GI loss, present on admission and now resolved, encourage p.o. intake  # Low normotensive- Midodrine 5 mg 3 times daily as needed for SBP less than 100  # Insulin-dependent diabetes mellitus-  continue home insulin dose of Novolin 18 units twice daily - Insulin SSI with at bedtime coverage ordered  # Anxiety and depression- continue home clonazepam 0.5 mg 3 times daily for anxiety,amitriptyline 75 mg nightly, mirtazapine 15 mg nightly  # Failure to thrive-  continue mirtazapine 50 mg nightly  # Hyperlipidemia-atorvastatin 80 mg nightly  # CAD-resumed Brilinta 90 mg twice daily, aspirin 81 mg daily, atorvastatin 80 mg nightly, Imdur 30 mg daily, ranolazine 500 mg twice daily, nitroglycerin sublingual 0.4 mg sublingual every 5 minutes as needed for chest pain  # Stable angina- ranolazine 500 mg twice daily resumed  # GERD-pantoprazole 40 mg daily, famotidine 20 mg twice daily resumed  # COPD-resumed home inhalers with  substitution for Dulera  # History of tobacco use-previously smoked 3 packs/day.  Counseled  # COVID screening test is negative    Body mass index is 24.56 kg/m.  Net IO Since Admission: 2,000 mL [10/11/20 1351]      Status is: Observation  The patient remains OBS appropriate and will d/c before 2 midnights.  Dispo: The patient is from: Home              Anticipated d/c is to: Home              Patient currently is not medically stable to d/c.   Difficult to place patient No   DVT prophylaxis:       enoxaparin (LOVENOX) injection 40 mg Start: 10/10/20 2230 Place TED hose Start: 10/10/20 1713     Family Communication: "discussed with patient")   All the records are reviewed and case discussed with Care Management/Social Worker. Management plans discussed with the patient, nursing and they are in agreement.  CODE STATUS: Full Code Level of care: Med-Surg  TOTAL TIME TAKING CARE OF THIS PATIENT: 35 minutes.   More than 50% of the time was spent in  counseling/coordination of care: YES  POSSIBLE D/C IN 1 DAYS, DEPENDING ON CLINICAL CONDITION.   Max Sane M.D on 10/11/2020 at 1:51 PM  [facility]   CC: Primary care physician; Olin Hauser, DO  Note: This dictation was prepared with Dragon dictation along with smaller phrase technology. Any transcriptional errors that result from this process are unintentional.

## 2020-10-12 DIAGNOSIS — F418 Other specified anxiety disorders: Secondary | ICD-10-CM | POA: Diagnosis not present

## 2020-10-12 DIAGNOSIS — R112 Nausea with vomiting, unspecified: Secondary | ICD-10-CM | POA: Diagnosis not present

## 2020-10-12 DIAGNOSIS — I25118 Atherosclerotic heart disease of native coronary artery with other forms of angina pectoris: Secondary | ICD-10-CM | POA: Diagnosis not present

## 2020-10-12 DIAGNOSIS — I5022 Chronic systolic (congestive) heart failure: Secondary | ICD-10-CM | POA: Diagnosis not present

## 2020-10-12 DIAGNOSIS — E860 Dehydration: Secondary | ICD-10-CM | POA: Diagnosis not present

## 2020-10-12 LAB — BASIC METABOLIC PANEL
Anion gap: 8 (ref 5–15)
BUN: 17 mg/dL (ref 6–20)
CO2: 25 mmol/L (ref 22–32)
Calcium: 8.9 mg/dL (ref 8.9–10.3)
Chloride: 105 mmol/L (ref 98–111)
Creatinine, Ser: 0.63 mg/dL (ref 0.44–1.00)
GFR, Estimated: >60 mL/min (ref >60)
Glucose, Bld: 227 mg/dL — ABNORMAL HIGH (ref 70–99)
Potassium: 3.4 mmol/L — ABNORMAL LOW (ref 3.5–5.1)
Sodium: 138 mmol/L (ref 135–145)

## 2020-10-12 LAB — GLUCOSE, CAPILLARY: Glucose-Capillary: 266 mg/dL — ABNORMAL HIGH (ref 70–99)

## 2020-10-12 LAB — CBC
HCT: 40.4 % (ref 36.0–46.0)
Hemoglobin: 13.3 g/dL (ref 12.0–15.0)
MCH: 31.2 pg (ref 26.0–34.0)
MCHC: 32.9 g/dL (ref 30.0–36.0)
MCV: 94.8 fL (ref 80.0–100.0)
Platelets: 309 10*3/uL (ref 150–400)
RBC: 4.26 MIL/uL (ref 3.87–5.11)
RDW: 13.4 % (ref 11.5–15.5)
WBC: 6.6 10*3/uL (ref 4.0–10.5)
nRBC: 0 % (ref 0.0–0.2)

## 2020-10-12 NOTE — Plan of Care (Signed)
Education resolved, patient to be discharged

## 2020-10-12 NOTE — Progress Notes (Signed)
Patient has chronic hypotension and BP is 96/67. Hospitalist informed - received OK to give scheduled Oxycontin.

## 2020-10-12 NOTE — Discharge Summary (Signed)
[facility] NAME: Renee Mack    MR#:  [id number]  DATE OF BIRTH:  02/21/1966  DATE OF ADMISSION:  10/10/2020   ADMITTING PHYSICIAN: Amy N Cox, DO  DATE OF DISCHARGE: 10/12/2020 12:08 PM  PRIMARY CARE PHYSICIAN: Olin Hauser, DO   ADMISSION DIAGNOSIS:  Cough [R05.9] Dehydration [E86.0] Colitis [K52.9] Generalized abdominal pain [R10.84] Congestion of nasal sinus [Z12.45] Uncomplicated opioid dependence (HCC) [F11.20] Nausea vomiting and diarrhea [R11.2, R19.7] Chest pain [R07.9] Intractable vomiting with nausea [R11.2] DISCHARGE DIAGNOSIS:  Principal Problem:   Intractable vomiting with nausea Active Problems:   Centrilobular emphysema (HCC)   GERD (gastroesophageal reflux disease)   Protein-calorie malnutrition, severe   Former smoker   Chest pain   Chronic systolic heart failure (HCC)   History of ST elevation myocardial infarction (STEMI)   CAD (coronary artery disease)   Hyperlipidemia associated with type 2 diabetes mellitus (HCC)   Anxiety associated with depression   Vulvar intraepithelial neoplasia (VIN) grade 3   Hyperglycemia   Palliative care patient   Vulvar cancer ([place])  SECONDARY DIAGNOSIS:   Past Medical History:  Diagnosis Date  . Cancer ([place])    vulvular  . Cervical disc disease    a. 01/2018 MRI Cervical spine: Cervical spondylosis with multilevel disc and facet degeneration greatest at C5-6 and C6-7.  Moderate to sev R C5-6, mod L C5-6, and mod bilat C6-7 foraminal stenosis with multilevel mild foraminal stenosis.  Mild C5-6 and mod C6-7 canal stenosis. C6-7 central cord impingement w/ cord flattening.  . Chronic combined systolic (congestive) and diastolic (congestive) heart failure ([place])    a. 12/2017 Echo: EF 30-35%, mid-apicalanteroseptal, ant, and apical AK. Gr1 DD. Mild conc LVH; b. 03/2018 Echo: EF 45-50%, antsept, ant HK. Mild MR. Nl LA size. Nl RV fxn.  Marland Kitchen COPD (chronic obstructive pulmonary  disease) (HCC)    not on home oxygen  . Coronary artery disease    a. 12/2017 ACS/PCI: LAD 100p (2.25x26 Resolute Onyx DES), 20m (2.0x12 Resolute Onyx DES), 80d (2.0x15 Resolute Onyx DES), RCA 90p (non-dominant). EF 25-35%. Post-MI course complicated by CGS; b. 01/997 NSTEMI/subacute thrombosis-->LAD 100 (PTCA + DES x 1); c. 01/2018 NSTEMI/PCI: LM min irregs, LAD 50-60p ISR/hazy (3.5x12 Resolute DES), 51m/d (underexpansion of prior stents-->PTCA). EF 45-50%.  Marland Kitchen GERD (gastroesophageal reflux disease)   . H/O degenerative disc disease   . Hypotension   . Ischemic cardiomyopathy    a. 12/2017 Echo: EF 30-35%; b. 03/2018 Echo: EF 45-50%.  . Myocardial infarction ([place])    a. 12/2017-->DES to LAD x 3.  . Pancreatitis   . Shingles   . Spinal stenosis   . Tobacco abuse   . Ulcer (traumatic) of oral mucosa   . Urinary incontinence   . VIN III (vulvar intraepithelial neoplasia III)    HOSPITAL COURSE:   55 y.o.femalewith medical history significant forstage III valvular intra epithelial carcinoma currently on palliative pain management,IDDM2, novolin 18 units in the AM and 18 units in the evening,CAD, GERD, COPD, history of pancreatitis,anxiety,chronic opioid use,heart failure reduced ejection fraction,hypertension admitted for nausea vomiting and diarrhea  #Intractable nausea and vomiting with diarrhea -present on admission and now resolved - likely viral etiology  #Heart failure reduced ejection fraction- well compensated at this time.  #Metabolic acidosis secondary to GI loss, present on admission and now resolved, encouraged p.o. intake  #Low normotensive-better at DC  #Insulin-dependent diabetes mellitus- SSI while in the hospital. Resume home meds at DC  #  Anxiety and depression- continue home clonazepam 0.5 mg 3 times daily for anxiety,amitriptyline 75 mg nightly, mirtazapine 15 mg nightly  #Failure to thrive- continue mirtazapine 50 mg  nightly  #Hyperlipidemia-atorvastatin 80 mg nightly  #CAD-continue Brilinta 90 mg twice daily, aspirin 81 mg daily, atorvastatin 80 mg nightly, Imdur 30 mg daily, ranolazine 500 mg twice daily  #Stable angina-ranolazine 500 mg twice daily   #GERD on PPI  #COPD-stable. No flare  #History of tobacco use-previously smoked 3 packs/day.  Counseled DISCHARGE CONDITIONS:  stable CONSULTS OBTAINED:   DRUG ALLERGIES:   Allergies  Allergen Reactions  . Bee Venom Itching, Shortness Of Breath and Swelling  . Metformin And Related Shortness Of Breath and Swelling  . Darvon [Propoxyphene] Itching  . Gabapentin Swelling  . Nsaids Other (See Comments)    Ulcers   . Tramadol Hives  . Contrast Media [Iodinated Diagnostic Agents] Rash    If you benadryl, and steroids she is able to take the contrast per pt   DISCHARGE MEDICATIONS:   Allergies as of 10/12/2020      Reactions   Bee Venom Itching, Shortness Of Breath, Swelling   Metformin And Related Shortness Of Breath, Swelling   Darvon [propoxyphene] Itching   Gabapentin Swelling   Nsaids Other (See Comments)   Ulcers   Tramadol Hives   Contrast Media [iodinated Diagnostic Agents] Rash   If you benadryl, and steroids she is able to take the contrast per pt      Medication List    STOP taking these medications   BD Veo Insulin Syringe U/F 31G X 15/64" 0.5 ML Misc Generic drug: Insulin Syringe-Needle U-100   docusate sodium 100 MG capsule Commonly known as: COLACE   insulin aspart 100 UNIT/ML injection Commonly known as: novoLOG   oxyCODONE-acetaminophen 5-325 MG tablet Commonly known as: PERCOCET/ROXICET   polyethylene glycol powder 17 GM/SCOOP powder Commonly known as: GLYCOLAX/MIRALAX     TAKE these medications   amitriptyline 25 MG tablet Commonly known as: ELAVIL Take 1 tablet (25 mg total) by mouth at bedtime. What changed: how much to take   aspirin 81 MG tablet Take 81 mg by mouth daily.    atorvastatin 80 MG tablet Commonly known as: LIPITOR Take 1 tablet (80 mg total) by mouth daily at 6 PM.   clonazePAM 1 MG tablet Commonly known as: KLONOPIN Take 1 mg by mouth 3 (three) times daily as needed.   esomeprazole 40 MG capsule Commonly known as: NexIUM Take 1 capsule (40 mg total) by mouth daily before breakfast.   famotidine 20 MG tablet Commonly known as: PEPCID Take 1 tablet (20 mg total) by mouth 2 (two) times daily.   GlucoCom Blood Glucose Monitor Devi 1 m.   Lidocaine HCl 4 % Gel Apply topically.   methocarbamol 500 MG tablet Commonly known as: ROBAXIN Take 1 tablet by mouth 4 (four) times daily as needed.   mirtazapine 15 MG tablet Commonly known as: REMERON Take 15 mg by mouth at bedtime.   nitroGLYCERIN 0.4 MG SL tablet Commonly known as: NITROSTAT Place 1 tablet (0.4 mg total) under the tongue every 5 (five) minutes as needed for chest pain.   NovoLIN 70/30 FlexPen (70-30) 100 UNIT/ML KwikPen Generic drug: insulin isophane & regular human Inject 18 Units into the skin 2 (two) times daily.   ondansetron 4 MG disintegrating tablet Commonly known as: ZOFRAN-ODT Take 4 mg by mouth every 8 (eight) hours as needed.   oxyCODONE 80 mg 12 hr tablet Commonly  known as: OXYCONTIN Take 1 tablet by mouth every 8 (eight) hours.   oxycodone 30 MG immediate release tablet Commonly known as: ROXICODONE Take 30 mg by mouth every 3 (three) hours as needed.   ProAir HFA 108 (90 Base) MCG/ACT inhaler Generic drug: albuterol Inhale 1 puff into the lungs every 6 (six) hours as needed for wheezing or shortness of breath.   ranolazine 500 MG 12 hr tablet Commonly known as: RANEXA Take 1 tablet (500 mg total) by mouth 2 (two) times daily.   Simple Diagnostics Lancing Dev Misc AS DIRECTED   SSD 1 % cream Generic drug: silver sulfADIAZINE SMARTSIG:1 Topical 4-5 Times Daily   sucralfate 1 g tablet Commonly known as: CARAFATE Take 1 g by mouth 4 (four)  times daily.   ticagrelor 90 MG Tabs tablet Commonly known as: BRILINTA Take 1 tablet (90 mg total) by mouth 2 (two) times daily. *NEEDS OFFICE VISIT FOR FURTHER REFILLS-PLEASE CALL [phone] TO SCHEDULE.*      DISCHARGE INSTRUCTIONS:   DIET:  Regular diet DISCHARGE CONDITION:  Good ACTIVITY:  Activity as tolerated OXYGEN:  Home Oxygen: No.  Oxygen Delivery: room air DISCHARGE LOCATION:  home   If you experience worsening of your admission symptoms, develop shortness of breath, life threatening emergency, suicidal or homicidal thoughts you must seek medical attention immediately by calling 911 or calling your MD immediately  if symptoms less severe.  You Must read complete instructions/literature along with all the possible adverse reactions/side effects for all the Medicines you take and that have been prescribed to you. Take any new Medicines after you have completely understood and accpet all the possible adverse reactions/side effects.   Please note  You were cared for by a hospitalist during your hospital stay. If you have any questions about your discharge medications or the care you received while you were in the hospital after you are discharged, you can call the unit and asked to speak with the hospitalist on call if the hospitalist that took care of you is not available. Once you are discharged, your primary care physician will handle any further medical issues. Please note that NO REFILLS for any discharge medications will be authorized once you are discharged, as it is imperative that you return to your primary care physician (or establish a relationship with a primary care physician if you do not have one) for your aftercare needs so that they can reassess your need for medications and monitor your lab values.    On the day of Discharge:  VITAL SIGNS:  Blood pressure 132/71, pulse 75, temperature 97.6 F (36.4 C), temperature source Oral, resp. rate 18, height 5\' 1"   (1.549 m), weight 59 kg, SpO2 99 %. PHYSICAL EXAMINATION:  GENERAL:  55 y.o.-year-old patient lying in the bed with no acute distress.  EYES: Pupils equal, round, reactive to light and accommodation. No scleral icterus. Extraocular muscles intact.  HEENT: Head atraumatic, normocephalic. Oropharynx and nasopharynx clear.  NECK:  Supple, no jugular venous distention. No thyroid enlargement, no tenderness.  LUNGS: Normal breath sounds bilaterally, no wheezing, rales,rhonchi or crepitation. No use of accessory muscles of respiration.  CARDIOVASCULAR: S1, S2 normal. No murmurs, rubs, or gallops.  ABDOMEN: Soft, non-tender, non-distended. Bowel sounds present. No organomegaly or mass.  EXTREMITIES: No pedal edema, cyanosis, or clubbing.  NEUROLOGIC: Cranial nerves II through XII are intact. Muscle strength 5/5 in all extremities. Sensation intact. Gait not checked.  PSYCHIATRIC: The patient is alert and oriented x 3.  SKIN:  No obvious rash, lesion, or ulcer.  DATA REVIEW:   CBC Recent Labs  Lab 10/12/20 0442  WBC 6.6  HGB 13.3  HCT 40.4  PLT 309    Chemistries  Recent Labs  Lab 10/10/20 0445 10/10/20 1045 10/12/20 0442  NA 134*   < > 138  K 3.9   < > 3.4*  CL 100   < > 105  CO2 18*   < > 25  GLUCOSE 310*   < > 227*  BUN 9   < > 17  CREATININE 0.69   < > 0.63  CALCIUM 9.9   < > 8.9  AST 12*  --   --   ALT 13  --   --   ALKPHOS 167*  --   --   BILITOT 1.2  --   --    < > = values in this interval not displayed.     Outpatient follow-up  Follow-up Information    Olin Hauser, DO. Schedule an appointment as soon as possible for a visit in 3 days.   Specialty: Family Medicine Why: patient to make own follow up appointment; office closed Contact information: [place] [address] [phone]        Wellington Hampshire, MD.   Specialty: Cardiology Why: patient to make own follow up appointment; office closed Contact information: [place] [address] [phone]               30 Day Unplanned Readmission Risk Score   Flowsheet Row ED to Hosp-Admission (Discharged) from 10/10/2020 in [facility]  30 Day Unplanned Readmission Risk Score (%) 14.41 Filed at 10/10/2020 1200     This score is the patient's risk of an unplanned readmission within 30 days of being discharged (0 -100%). The score is based on dignosis, age, lab data, medications, orders, and past utilization.   Low:  0-14.9   Medium: 15-21.9   High: 22-29.9   Extreme: 30 and above         Management plans discussed with the patient, family and they are in agreement.  CODE STATUS: Prior   TOTAL TIME TAKING CARE OF THIS PATIENT: 45 minutes.    Max Sane M.D on 10/12/2020 at 7:16 PM  [facility]   CC: Primary care physician; Olin Hauser, DO   Note: This dictation was prepared with Dragon dictation along with smaller phrase technology. Any transcriptional errors that result from this process are unintentional.

## 2020-10-12 NOTE — Discharge Instructions (Signed)

## 2020-10-13 ENCOUNTER — Emergency Department
Admission: EM | Admit: 2020-10-13 | Discharge: 2020-10-13 | Disposition: A | Discharge status: Home / Self Care | Payer: Medicare Other | Attending: Emergency Medicine | Admitting: Emergency Medicine

## 2020-10-13 ENCOUNTER — Other Ambulatory Visit: Payer: Self-pay

## 2020-10-13 DIAGNOSIS — J449 Chronic obstructive pulmonary disease, unspecified: Secondary | ICD-10-CM | POA: Diagnosis not present

## 2020-10-13 DIAGNOSIS — Z87891 Personal history of nicotine dependence: Secondary | ICD-10-CM | POA: Insufficient documentation

## 2020-10-13 DIAGNOSIS — Z794 Long term (current) use of insulin: Secondary | ICD-10-CM | POA: Insufficient documentation

## 2020-10-13 DIAGNOSIS — Z8544 Personal history of malignant neoplasm of other female genital organs: Secondary | ICD-10-CM | POA: Diagnosis not present

## 2020-10-13 DIAGNOSIS — R112 Nausea with vomiting, unspecified: Secondary | ICD-10-CM | POA: Diagnosis not present

## 2020-10-13 DIAGNOSIS — R1084 Generalized abdominal pain: Secondary | ICD-10-CM | POA: Diagnosis present

## 2020-10-13 DIAGNOSIS — Z7982 Long term (current) use of aspirin: Secondary | ICD-10-CM | POA: Insufficient documentation

## 2020-10-13 DIAGNOSIS — I5042 Chronic combined systolic (congestive) and diastolic (congestive) heart failure: Secondary | ICD-10-CM | POA: Insufficient documentation

## 2020-10-13 DIAGNOSIS — I2510 Atherosclerotic heart disease of native coronary artery without angina pectoris: Secondary | ICD-10-CM | POA: Insufficient documentation

## 2020-10-13 DIAGNOSIS — G8929 Other chronic pain: Secondary | ICD-10-CM

## 2020-10-13 DIAGNOSIS — E1110 Type 2 diabetes mellitus with ketoacidosis without coma: Secondary | ICD-10-CM | POA: Diagnosis not present

## 2020-10-13 LAB — URINALYSIS, COMPLETE (UACMP) WITH MICROSCOPIC
Bacteria, UA: NONE SEEN
Bilirubin Urine: NEGATIVE
Glucose, UA: >=500 mg/dL — AB
Hgb urine dipstick: NEGATIVE
Ketones, ur: NEGATIVE mg/dL
Leukocytes,Ua: NEGATIVE
Nitrite: NEGATIVE
Protein, ur: NEGATIVE mg/dL
Specific Gravity, Urine: 1.001 — ABNORMAL LOW (ref 1.005–1.030)
pH: 6 (ref 5.0–8.0)

## 2020-10-13 LAB — COMPREHENSIVE METABOLIC PANEL
ALT: 8 U/L (ref 0–44)
AST: 13 U/L — ABNORMAL LOW (ref 15–41)
Albumin: 3.5 g/dL (ref 3.5–5.0)
Alkaline Phosphatase: 84 U/L (ref 38–126)
Anion gap: 9 (ref 5–15)
BUN: 16 mg/dL (ref 6–20)
CO2: 23 mmol/L (ref 22–32)
Calcium: 9 mg/dL (ref 8.9–10.3)
Chloride: 102 mmol/L (ref 98–111)
Creatinine, Ser: 0.68 mg/dL (ref 0.44–1.00)
GFR, Estimated: >60 mL/min (ref >60)
Glucose, Bld: 210 mg/dL — ABNORMAL HIGH (ref 70–99)
Potassium: 3.7 mmol/L (ref 3.5–5.1)
Sodium: 134 mmol/L — ABNORMAL LOW (ref 135–145)
Total Bilirubin: 0.4 mg/dL (ref 0.3–1.2)
Total Protein: 7.4 g/dL (ref 6.5–8.1)

## 2020-10-13 LAB — CBC
HCT: 45.8 % (ref 36.0–46.0)
Hemoglobin: 14.2 g/dL (ref 12.0–15.0)
MCH: 31.2 pg (ref 26.0–34.0)
MCHC: 31 g/dL (ref 30.0–36.0)
MCV: 100.7 fL — ABNORMAL HIGH (ref 80.0–100.0)
Platelets: 187 10*3/uL (ref 150–400)
RBC: 4.55 MIL/uL (ref 3.87–5.11)
RDW: 13.3 % (ref 11.5–15.5)
WBC: 6.8 10*3/uL (ref 4.0–10.5)
nRBC: 0 % (ref 0.0–0.2)

## 2020-10-13 LAB — LIPASE, BLOOD: Lipase: 18 U/L (ref 11–51)

## 2020-10-13 MED ORDER — HYDROMORPHONE HCL 2 MG/ML IJ SOLN: 4.0000 mg | Freq: Once | INTRAMUSCULAR | Status: DC

## 2020-10-13 MED ORDER — LACTATED RINGERS IV BOLUS
1000.0000 mL | Freq: Once | INTRAVENOUS | Status: AC
  Administered 2020-10-13: 1000 mL via INTRAVENOUS

## 2020-10-13 MED ORDER — ONDANSETRON HCL 4 MG/2ML IJ SOLN
4.0000 mg | Freq: Once | INTRAMUSCULAR | Status: AC
  Administered 2020-10-13: 4 mg via INTRAVENOUS

## 2020-10-13 MED ORDER — ONDANSETRON HCL 4 MG/2ML IJ SOLN
INTRAMUSCULAR | Status: AC
  Filled 2020-10-13: qty 2

## 2020-10-13 MED ORDER — HYDROMORPHONE HCL 1 MG/ML IJ SOLN
4.0000 mg | Freq: Once | INTRAMUSCULAR | Status: AC
Start: 2020-10-13 — End: 2020-10-13
  Administered 2020-10-13: 4 mg via INTRAVENOUS
  Filled 2020-10-13: qty 4

## 2020-10-13 MED ORDER — HYDROMORPHONE HCL 1 MG/ML IJ SOLN
2.0000 mg | Freq: Once | INTRAMUSCULAR | Status: AC
  Administered 2020-10-13: 2 mg via INTRAVENOUS
  Filled 2020-10-13: qty 2

## 2020-10-13 NOTE — ED Notes (Signed)
Pt given 2mg  dilaudid. Will monitor patient for 30 min before discharge.

## 2020-10-13 NOTE — ED Triage Notes (Signed)
Pt comes with c/o abdominal pain and vomiting that started last night. Pt states unable to keep anything down.

## 2020-10-13 NOTE — Discharge Instructions (Signed)
Please seek medical attention for any high fevers, chest pain, shortness of breath, change in behavior, persistent vomiting, bloody stool or any other new or concerning symptoms.  

## 2020-10-13 NOTE — ED Provider Notes (Signed)
[facility] Emergency Department Provider Note ____________________________________________   Event Date/Time   First MD Initiated Contact with Patient 10/13/20 1250     (approximate)  I have reviewed the triage vital signs and the nursing notes.  HISTORY  Chief Complaint Abdominal Pain   HPI Renee Mack is a 55 y.o. femalewho presents to the ED for evaluation of abdominal pain and emesis.  Chart review indicates history of CHF with reduced ejection fraction, CAD, GERD, DM on insulin.  History of stage III vulvar carcinoma on palliative care pain management. Patient had recent admission 4/22-4/24, discharging yesterday afternoon, due to intractable nausea and vomiting attributed to viral etiology and opiate withdrawals.  I reviewed CT scan from this admission on 4/22 with wall thickening suggestive of proximal colitis.   Looks like patient is on heroic doses of opiates due to her cancer-related pain.  OxyContin 80 mg every 8 hours and short acting oxycodone 30 mg every 3-4 hours.  Patient reports feeling "okay" at the time of discharge yesterday, but having worsening nausea, vomiting and pain diffusely throughout her body overnight last night and through this morning.  She reports recurrent nausea, vomiting and associated worsening generalized pain progressively worsening throughout the day.  Denies falls, syncopal episodes, fever, injuries or trauma.  Denies stool changes or dysuria, but does report decreased urinary output.  Past Medical History:  Diagnosis Date  . Cancer ([place])    vulvular  . Cervical disc disease    a. 01/2018 MRI Cervical spine: Cervical spondylosis with multilevel disc and facet degeneration greatest at C5-6 and C6-7.  Moderate to sev R C5-6, mod L C5-6, and mod bilat C6-7 foraminal stenosis with multilevel mild foraminal stenosis.  Mild C5-6 and mod C6-7 canal stenosis. C6-7 central cord impingement w/ cord flattening.  . Chronic  combined systolic (congestive) and diastolic (congestive) heart failure ([place])    a. 12/2017 Echo: EF 30-35%, mid-apicalanteroseptal, ant, and apical AK. Gr1 DD. Mild conc LVH; b. 03/2018 Echo: EF 45-50%, antsept, ant HK. Mild MR. Nl LA size. Nl RV fxn.  Marland Kitchen COPD (chronic obstructive pulmonary disease) (HCC)    not on home oxygen  . Coronary artery disease    a. 12/2017 ACS/PCI: LAD 100p (2.25x26 Resolute Onyx DES), 68m (2.0x12 Resolute Onyx DES), 80d (2.0x15 Resolute Onyx DES), RCA 90p (non-dominant). EF 25-35%. Post-MI course complicated by CGS; b. 02/7587 NSTEMI/subacute thrombosis-->LAD 100 (PTCA + DES x 1); c. 01/2018 NSTEMI/PCI: LM min irregs, LAD 50-60p ISR/hazy (3.5x12 Resolute DES), 34m/d (underexpansion of prior stents-->PTCA). EF 45-50%.  Marland Kitchen GERD (gastroesophageal reflux disease)   . H/O degenerative disc disease   . Hypotension   . Ischemic cardiomyopathy    a. 12/2017 Echo: EF 30-35%; b. 03/2018 Echo: EF 45-50%.  . Myocardial infarction ([place])    a. 12/2017-->DES to LAD x 3.  . Pancreatitis   . Shingles   . Spinal stenosis   . Tobacco abuse   . Ulcer (traumatic) of oral mucosa   . Urinary incontinence   . VIN III (vulvar intraepithelial neoplasia III)     Patient Active Problem List   Diagnosis Date Noted  . Intractable vomiting with nausea 10/10/2020  . Diabetic ketoacidosis ([place]) 03/30/2020  . DKA (diabetic ketoacidosis) ([place]) 03/30/2020  . Vulvar cancer ([place]) 07/27/2019  . Palliative care patient 09/04/2018  . Hyperglycemia 05/28/2018  . Vulvar intraepithelial neoplasia (VIN) grade 3 05/24/2018  . History of ST elevation myocardial infarction (STEMI) 02/27/2018  . CAD (coronary artery disease) 02/27/2018  .  Hyperlipidemia associated with type 2 diabetes mellitus ([place]) 02/27/2018  . Anxiety associated with depression 02/27/2018  . Chronic systolic heart failure ([place])   . Chest pain 12/26/2017  . Unstable angina ([place])   . Ischemic cardiomyopathy   . Former smoker   .  Protein-calorie malnutrition, severe 07/18/2017  . DM (diabetes mellitus) ([place]) 12/28/2014  . Centrilobular emphysema ([place]) 12/28/2014  . GERD (gastroesophageal reflux disease) 12/28/2014  . History of shingles 12/28/2014  . Hyperlipidemia LDL goal <70 12/28/2014    Past Surgical History:  Procedure Laterality Date  . ABDOMINAL HYSTERECTOMY     partial  . CORONARY BALLOON ANGIOPLASTY N/A 05/22/2018   Procedure: CORONARY BALLOON ANGIOPLASTY;  Surgeon: Wellington Hampshire, MD;  Location: [place] [facility];  Service: Cardiovascular;  Laterality: N/A;  . CORONARY STENT INTERVENTION N/A 12/26/2017   Procedure: CORONARY STENT INTERVENTION;  Surgeon: Wellington Hampshire, MD;  Location: [place] [facility];  Service: Cardiovascular;  Laterality: N/A;  . CORONARY STENT INTERVENTION N/A 02/08/2018   Procedure: CORONARY STENT INTERVENTION;  Surgeon: Nelva Bush, MD;  Location: [place] [facility];  Service: Cardiovascular;  Laterality: N/A;  . CORONARY/GRAFT ACUTE MI REVASCULARIZATION N/A 12/19/2017   Procedure: Coronary/Graft Acute MI Revascularization;  Surgeon: Wellington Hampshire, MD;  Location: [place] [facility];  Service: Cardiovascular;  Laterality: N/A;  . ECTOPIC PREGNANCY SURGERY Left   . INTRAVASCULAR ULTRASOUND/IVUS N/A 02/08/2018   Procedure: Intravascular Ultrasound/IVUS;  Surgeon: Nelva Bush, MD;  Location: [place] [facility];  Service: Cardiovascular;  Laterality: N/A;  . LEFT HEART CATH AND CORONARY ANGIOGRAPHY N/A 12/19/2017   Procedure: LEFT HEART CATH AND CORONARY ANGIOGRAPHY;  Surgeon: Wellington Hampshire, MD;  Location: [place] [facility];  Service: Cardiovascular;  Laterality: N/A;  . LEFT HEART CATH AND CORONARY ANGIOGRAPHY N/A 12/26/2017   Procedure: LEFT HEART CATH AND CORONARY ANGIOGRAPHY;  Surgeon: Wellington Hampshire, MD;  Location: [place] [facility];  Service: Cardiovascular;  Laterality: N/A;  . LEFT HEART CATH AND CORONARY ANGIOGRAPHY N/A 02/08/2018   Procedure:  LEFT HEART CATH AND CORONARY ANGIOGRAPHY;  Surgeon: Nelva Bush, MD;  Location: [place] [facility];  Service: Cardiovascular;  Laterality: N/A;  . LEFT HEART CATH AND CORONARY ANGIOGRAPHY N/A 05/22/2018   Procedure: LEFT HEART CATH AND CORONARY ANGIOGRAPHY;  Surgeon: Wellington Hampshire, MD;  Location: [place] [facility];  Service: Cardiovascular;  Laterality: N/A;  . LEFT HEART CATH AND CORONARY ANGIOGRAPHY N/A 06/15/2018   Procedure: LEFT HEART CATH AND CORONARY ANGIOGRAPHY;  Surgeon: Wellington Hampshire, MD;  Location: [place] [facility];  Service: Cardiovascular;  Laterality: N/A;  . LYMPHADENECTOMY    . SPLENECTOMY, PARTIAL    . vulvulasectomy      Prior to Admission medications   Medication Sig Start Date End Date Taking? Authorizing Provider  albuterol (PROAIR HFA) 108 (90 Base) MCG/ACT inhaler Inhale 1 puff into the lungs every 6 (six) hours as needed for wheezing or shortness of breath.     [provider]  amitriptyline (ELAVIL) 25 MG tablet Take 1 tablet (25 mg total) by mouth at bedtime. Patient taking differently: Take 75 mg by mouth at bedtime. 04/05/20 05/05/20  ShahmehdiValeria Batman, MD  aspirin 81 MG tablet Take 81 mg by mouth daily.    [provider]  atorvastatin (LIPITOR) 80 MG tablet Take 1 tablet (80 mg total) by mouth daily at 6 PM. 03/05/19 03/30/20  Theora Gianotti, NP  Blood Glucose Monitoring Suppl (GLUCOCOM BLOOD GLUCOSE  MONITOR) DEVI 1 m. 11/15/17   Olin Hauser, DO  clonazePAM (KLONOPIN) 1 MG tablet Take 1 mg by mouth 3 (three) times daily as needed. 03/19/20   [provider]  esomeprazole (NEXIUM) 40 MG capsule Take 1 capsule (40 mg total) by mouth daily before breakfast. 09/22/20   Parks Ranger, Devonne Doughty, DO  famotidine (PEPCID) 20 MG tablet Take 1 tablet (20 mg total) by mouth 2 (two) times daily. 09/22/20   Karamalegos, Devonne Doughty, DO  insulin isophane & regular human (NOVOLIN 70/30 FLEXPEN) (70-30) 100 UNIT/ML  KwikPen Inject 18 Units into the skin 2 (two) times daily. 04/05/20   Deatra James, MD  Lancet Devices (SIMPLE DIAGNOSTICS LANCING DEV) MISC AS DIRECTED 11/15/17   Parks Ranger Devonne Doughty, DO  Lidocaine HCl 4 % GEL Apply topically. 02/05/20   [provider]  methocarbamol (ROBAXIN) 500 MG tablet Take 1 tablet by mouth 4 (four) times daily as needed. 08/01/20   [provider]  mirtazapine (REMERON) 15 MG tablet Take 15 mg by mouth at bedtime. 07/07/18   [provider]  nitroGLYCERIN (NITROSTAT) 0.4 MG SL tablet Place 1 tablet (0.4 mg total) under the tongue every 5 (five) minutes as needed for chest pain. 06/16/18   Gouru, Illene Silver, MD  ondansetron (ZOFRAN-ODT) 4 MG disintegrating tablet Take 4 mg by mouth every 8 (eight) hours as needed. 11/27/19   [provider]  oxyCODONE (OXYCONTIN) 80 mg 12 hr tablet Take 1 tablet by mouth every 8 (eight) hours.    [provider]  oxycodone (ROXICODONE) 30 MG immediate release tablet Take 30 mg by mouth every 3 (three) hours as needed. 04/14/20   [provider]  ranolazine (RANEXA) 500 MG 12 hr tablet Take 1 tablet (500 mg total) by mouth 2 (two) times daily. 04/05/20 05/05/20  Deatra James, MD  SSD 1 % cream SMARTSIG:1 Topical 4-5 Times Daily 01/25/20   [provider]  sucralfate (CARAFATE) 1 g tablet Take 1 g by mouth 4 (four) times daily. 03/01/20   [provider]  ticagrelor (BRILINTA) 90 MG TABS tablet Take 1 tablet (90 mg total) by mouth 2 (two) times daily. *NEEDS OFFICE VISIT FOR FURTHER REFILLS-PLEASE CALL [phone] TO SCHEDULE.* 05/14/19   Theora Gianotti, NP    Allergies Bee venom, Metformin and related, Darvon [propoxyphene], Gabapentin, Nsaids, Tramadol, and Contrast media [iodinated diagnostic agents]  Family History  Problem Relation Age of Onset  . Non-Hodgkin's lymphoma Mother   . Osteoporosis Mother   . Lupus Sister   . Heart disease Brother   .  Heart attack Brother   . Heart attack Father     Social History Social History   Tobacco Use  . Smoking status: Former Smoker    Packs/day: 0.50    Years: 30.00    Pack years: 15.00    Types: Cigarettes    Quit date: 12/19/2017    Years since quitting: 2.8  . Smokeless tobacco: Former Systems developer  . Tobacco comment: quit 12/19/2017.  Vaping Use  . Vaping Use: Never used  Substance Use Topics  . Alcohol use: No  . Drug use: No    Review of Systems  Constitutional: No fever/chills Eyes: No visual changes. ENT: No sore throat. Cardiovascular: Denies chest pain. Respiratory: Denies shortness of breath. Gastrointestinal: No abdominal pain.  No diarrhea.  No constipation. Positive for nausea and vomiting Genitourinary: Negative for dysuria. Musculoskeletal: Positive for severe diffuse myalgias Skin: Negative for rash. Neurological: Negative for headaches,  focal weakness or numbness.  ____________________________________________   PHYSICAL EXAM:  VITAL SIGNS: Vitals:   10/13/20 1217  BP: 112/77  Pulse: 91  Resp: 18  Temp: 98.5 F (36.9 C)  SpO2: 96%    Constitutional: Alert and oriented.  Laying on her side and appears uncomfortable.  Conversational in full sentences. Eyes: Conjunctivae are normal. PERRL. EOMI. Head: Atraumatic. Nose: No congestion/rhinnorhea. Mouth/Throat: Mucous membranes are moist.  Oropharynx non-erythematous. Neck: No stridor. No cervical spine tenderness to palpation. Cardiovascular: Normal rate, regular rhythm. Grossly normal heart sounds.  Good peripheral circulation. Respiratory: Normal respiratory effort.  No retractions. Lungs CTAB. Gastrointestinal: Soft , nondistended, nontender to palpation. No CVA tenderness.   Musculoskeletal: No lower extremity tenderness nor edema.  No joint effusions. No signs of acute trauma. Neurologic:  Normal speech and language. No gross focal neurologic deficits are appreciated. No gait instability noted. Skin:   Skin is warm, dry and intact. No rash noted. Psychiatric: Mood and affect are normal. Speech and behavior are normal. ____________________________________________   LABS (all labs ordered are listed, but only abnormal results are displayed)  Labs Reviewed  COMPREHENSIVE METABOLIC PANEL - Abnormal; Notable for the following components:      Result Value   Sodium 134 (*)    Glucose, Bld 210 (*)    AST 13 (*)    All other components within normal limits  CBC - Abnormal; Notable for the following components:   MCV 100.7 (*)    All other components within normal limits  LIPASE, BLOOD  URINALYSIS, COMPLETE (UACMP) WITH MICROSCOPIC   ____________________________________________  RADIOLOGY  ED MD interpretation:    Official radiology report(s): No results found.  ____________________________________________   PROCEDURES and INTERVENTIONS  Procedure(s) performed (including Critical Care):  .1-3 Lead EKG Interpretation Performed by: Vladimir Crofts, MD Authorized by: Vladimir Crofts, MD     Interpretation: normal     ECG rate:  90   ECG rate assessment: normal     Rhythm: sinus rhythm     Ectopy: none     Conduction: normal    Ultrasound ED Peripheral IV (Provider)  Date/Time: 10/13/2020 2:46 PM Performed by: Vladimir Crofts, MD Authorized by: Vladimir Crofts, MD   Procedure details:    Indications: hydration, multiple failed IV attempts and poor IV access     Skin Prep: chlorhexidine gluconate     Location: left basilic v.   Angiocath:  20 G   Bedside Ultrasound Guided: Yes     Images: not archived     Patient tolerated procedure without complications: Yes     Dressing applied: Yes      Medications  lactated ringers bolus 1,000 mL (has no administration in time range)  HYDROmorphone (DILAUDID) injection 4 mg (has no administration in time range)    ____________________________________________   MDM / ED COURSE   55 year old woman presents with acute on chronic  diffuse pain, recurrent emesis and nausea, likely due to opiate withdrawals.  Normal vitals.  Exam with a benign abdomen, and no signs of trauma, neurologic or vascular deficits.  Blood work is unremarkable.  Will provide fluid resuscitation and opiate analgesia.  I suspect as we provide opiates and fluids her symptoms will resolve and she will hopefully be amenable to outpatient management after this.  Awaiting urinalysis.  Pt signed out to oncoming provider to follow up on UA and clinical picture.    Clinical Course as of 10/13/20 1446  Mon Oct 13, 2020  1445 USIV placed by me  [  DS]    Clinical Course User Index [DS] Vladimir Crofts, MD    ____________________________________________   FINAL CLINICAL IMPRESSION(S) / ED DIAGNOSES  Final diagnoses:  None     ED Discharge Orders    None       [place]   Note:  This document was prepared using Dragon voice recognition software and may include unintentional dictation errors.   Vladimir Crofts, MD 10/13/20 [phone]

## 2020-11-06 ENCOUNTER — Other Ambulatory Visit: Payer: Self-pay

## 2020-11-06 ENCOUNTER — Inpatient Hospital Stay: Payer: Medicare Other | Admitting: Registered Nurse

## 2020-11-06 ENCOUNTER — Inpatient Hospital Stay: Payer: Medicare Other

## 2020-11-06 ENCOUNTER — Inpatient Hospital Stay
Admission: EM | Admit: 2020-11-06 | Discharge: 2020-11-14 | DRG: 471 | Disposition: A | Discharge status: Home Health | Payer: Medicare Other | Attending: Internal Medicine | Admitting: Internal Medicine

## 2020-11-06 ENCOUNTER — Emergency Department: Payer: Medicare Other

## 2020-11-06 ENCOUNTER — Encounter
Admission: EM | Disposition: A | Discharge status: Home Health | Payer: Self-pay | Source: Home / Self Care | Attending: Internal Medicine

## 2020-11-06 DIAGNOSIS — G9341 Metabolic encephalopathy: Secondary | ICD-10-CM | POA: Diagnosis not present

## 2020-11-06 DIAGNOSIS — Z832 Family history of diseases of the blood and blood-forming organs and certain disorders involving the immune mechanism: Secondary | ICD-10-CM

## 2020-11-06 DIAGNOSIS — Z20822 Contact with and (suspected) exposure to covid-19: Secondary | ICD-10-CM | POA: Diagnosis present

## 2020-11-06 DIAGNOSIS — Z9081 Acquired absence of spleen: Secondary | ICD-10-CM

## 2020-11-06 DIAGNOSIS — Z7982 Long term (current) use of aspirin: Secondary | ICD-10-CM

## 2020-11-06 DIAGNOSIS — J690 Pneumonitis due to inhalation of food and vomit: Secondary | ICD-10-CM | POA: Diagnosis not present

## 2020-11-06 DIAGNOSIS — Z91041 Radiographic dye allergy status: Secondary | ICD-10-CM

## 2020-11-06 DIAGNOSIS — Z794 Long term (current) use of insulin: Secondary | ICD-10-CM

## 2020-11-06 DIAGNOSIS — E1165 Type 2 diabetes mellitus with hyperglycemia: Secondary | ICD-10-CM | POA: Diagnosis not present

## 2020-11-06 DIAGNOSIS — Z888 Allergy status to other drugs, medicaments and biological substances status: Secondary | ICD-10-CM

## 2020-11-06 DIAGNOSIS — Z6824 Body mass index (BMI) 24.0-24.9, adult: Secondary | ICD-10-CM

## 2020-11-06 DIAGNOSIS — Z8249 Family history of ischemic heart disease and other diseases of the circulatory system: Secondary | ICD-10-CM

## 2020-11-06 DIAGNOSIS — M79602 Pain in left arm: Secondary | ICD-10-CM

## 2020-11-06 DIAGNOSIS — Z90710 Acquired absence of both cervix and uterus: Secondary | ICD-10-CM

## 2020-11-06 DIAGNOSIS — Z885 Allergy status to narcotic agent status: Secondary | ICD-10-CM

## 2020-11-06 DIAGNOSIS — I25118 Atherosclerotic heart disease of native coronary artery with other forms of angina pectoris: Secondary | ICD-10-CM | POA: Diagnosis not present

## 2020-11-06 DIAGNOSIS — Z807 Family history of other malignant neoplasms of lymphoid, hematopoietic and related tissues: Secondary | ICD-10-CM

## 2020-11-06 DIAGNOSIS — C519 Malignant neoplasm of vulva, unspecified: Secondary | ICD-10-CM | POA: Diagnosis present

## 2020-11-06 DIAGNOSIS — T380X5A Adverse effect of glucocorticoids and synthetic analogues, initial encounter: Secondary | ICD-10-CM | POA: Diagnosis not present

## 2020-11-06 DIAGNOSIS — J432 Centrilobular emphysema: Secondary | ICD-10-CM | POA: Diagnosis present

## 2020-11-06 DIAGNOSIS — R7401 Elevation of levels of liver transaminase levels: Secondary | ICD-10-CM | POA: Diagnosis not present

## 2020-11-06 DIAGNOSIS — R1312 Dysphagia, oropharyngeal phase: Secondary | ICD-10-CM | POA: Diagnosis not present

## 2020-11-06 DIAGNOSIS — Z8262 Family history of osteoporosis: Secondary | ICD-10-CM

## 2020-11-06 DIAGNOSIS — Z923 Personal history of irradiation: Secondary | ICD-10-CM

## 2020-11-06 DIAGNOSIS — A419 Sepsis, unspecified organism: Secondary | ICD-10-CM | POA: Diagnosis not present

## 2020-11-06 DIAGNOSIS — M20092 Other deformity of left finger(s): Secondary | ICD-10-CM | POA: Diagnosis present

## 2020-11-06 DIAGNOSIS — R29818 Other symptoms and signs involving the nervous system: Secondary | ICD-10-CM | POA: Diagnosis not present

## 2020-11-06 DIAGNOSIS — M4802 Spinal stenosis, cervical region: Principal | ICD-10-CM | POA: Diagnosis present

## 2020-11-06 DIAGNOSIS — F418 Other specified anxiety disorders: Secondary | ICD-10-CM | POA: Diagnosis present

## 2020-11-06 DIAGNOSIS — Z91030 Bee allergy status: Secondary | ICD-10-CM

## 2020-11-06 DIAGNOSIS — M50023 Cervical disc disorder at C6-C7 level with myelopathy: Secondary | ICD-10-CM | POA: Diagnosis present

## 2020-11-06 DIAGNOSIS — E440 Moderate protein-calorie malnutrition: Secondary | ICD-10-CM | POA: Diagnosis present

## 2020-11-06 DIAGNOSIS — M2578 Osteophyte, vertebrae: Secondary | ICD-10-CM | POA: Diagnosis present

## 2020-11-06 DIAGNOSIS — R29898 Other symptoms and signs involving the musculoskeletal system: Secondary | ICD-10-CM

## 2020-11-06 DIAGNOSIS — Z87891 Personal history of nicotine dependence: Secondary | ICD-10-CM

## 2020-11-06 DIAGNOSIS — Y92239 Unspecified place in hospital as the place of occurrence of the external cause: Secondary | ICD-10-CM | POA: Diagnosis not present

## 2020-11-06 DIAGNOSIS — R6520 Severe sepsis without septic shock: Secondary | ICD-10-CM | POA: Diagnosis not present

## 2020-11-06 DIAGNOSIS — E785 Hyperlipidemia, unspecified: Secondary | ICD-10-CM | POA: Diagnosis present

## 2020-11-06 DIAGNOSIS — I252 Old myocardial infarction: Secondary | ICD-10-CM | POA: Diagnosis not present

## 2020-11-06 DIAGNOSIS — Z886 Allergy status to analgesic agent status: Secondary | ICD-10-CM

## 2020-11-06 DIAGNOSIS — Z955 Presence of coronary angioplasty implant and graft: Secondary | ICD-10-CM

## 2020-11-06 DIAGNOSIS — Z9119 Patient's noncompliance with other medical treatment and regimen: Secondary | ICD-10-CM

## 2020-11-06 DIAGNOSIS — K219 Gastro-esophageal reflux disease without esophagitis: Secondary | ICD-10-CM | POA: Diagnosis present

## 2020-11-06 DIAGNOSIS — K838 Other specified diseases of biliary tract: Secondary | ICD-10-CM | POA: Diagnosis present

## 2020-11-06 DIAGNOSIS — K5900 Constipation, unspecified: Secondary | ICD-10-CM | POA: Diagnosis not present

## 2020-11-06 DIAGNOSIS — Z79899 Other long term (current) drug therapy: Secondary | ICD-10-CM

## 2020-11-06 DIAGNOSIS — I5022 Chronic systolic (congestive) heart failure: Secondary | ICD-10-CM | POA: Diagnosis not present

## 2020-11-06 DIAGNOSIS — R079 Chest pain, unspecified: Secondary | ICD-10-CM | POA: Diagnosis present

## 2020-11-06 DIAGNOSIS — Z981 Arthrodesis status: Secondary | ICD-10-CM | POA: Diagnosis not present

## 2020-11-06 DIAGNOSIS — T17908A Unspecified foreign body in respiratory tract, part unspecified causing other injury, initial encounter: Secondary | ICD-10-CM

## 2020-11-06 DIAGNOSIS — M50123 Cervical disc disorder at C6-C7 level with radiculopathy: Secondary | ICD-10-CM | POA: Diagnosis present

## 2020-11-06 DIAGNOSIS — G893 Neoplasm related pain (acute) (chronic): Secondary | ICD-10-CM | POA: Diagnosis present

## 2020-11-06 DIAGNOSIS — R042 Hemoptysis: Secondary | ICD-10-CM | POA: Diagnosis present

## 2020-11-06 DIAGNOSIS — Z515 Encounter for palliative care: Secondary | ICD-10-CM

## 2020-11-06 DIAGNOSIS — I255 Ischemic cardiomyopathy: Secondary | ICD-10-CM | POA: Diagnosis present

## 2020-11-06 DIAGNOSIS — M21332 Wrist drop, left wrist: Secondary | ICD-10-CM | POA: Diagnosis present

## 2020-11-06 DIAGNOSIS — E876 Hypokalemia: Secondary | ICD-10-CM | POA: Diagnosis not present

## 2020-11-06 DIAGNOSIS — Z9114 Patient's other noncompliance with medication regimen: Secondary | ICD-10-CM

## 2020-11-06 DIAGNOSIS — Z419 Encounter for procedure for purposes other than remedying health state, unspecified: Secondary | ICD-10-CM

## 2020-11-06 DIAGNOSIS — J392 Other diseases of pharynx: Secondary | ICD-10-CM | POA: Diagnosis not present

## 2020-11-06 DIAGNOSIS — Z7189 Other specified counseling: Secondary | ICD-10-CM | POA: Diagnosis not present

## 2020-11-06 DIAGNOSIS — E119 Type 2 diabetes mellitus without complications: Secondary | ICD-10-CM

## 2020-11-06 DIAGNOSIS — I700 Atherosclerosis of aorta: Secondary | ICD-10-CM | POA: Diagnosis present

## 2020-11-06 DIAGNOSIS — I5042 Chronic combined systolic (congestive) and diastolic (congestive) heart failure: Secondary | ICD-10-CM | POA: Diagnosis present

## 2020-11-06 DIAGNOSIS — I2510 Atherosclerotic heart disease of native coronary artery without angina pectoris: Secondary | ICD-10-CM | POA: Diagnosis present

## 2020-11-06 DIAGNOSIS — R070 Pain in throat: Secondary | ICD-10-CM | POA: Diagnosis not present

## 2020-11-06 HISTORY — PX: ANTERIOR CERVICAL DECOMP/DISCECTOMY FUSION: SHX1161

## 2020-11-06 LAB — CBC WITH DIFFERENTIAL/PLATELET
Abs Immature Granulocytes: 0.01 10*3/uL (ref 0.00–0.07)
Basophils Absolute: 0 10*3/uL (ref 0.0–0.1)
Basophils Relative: 0 %
Eosinophils Absolute: 0.3 10*3/uL (ref 0.0–0.5)
Eosinophils Relative: 5 %
HCT: 42.4 % (ref 36.0–46.0)
Hemoglobin: 14.3 g/dL (ref 12.0–15.0)
Immature Granulocytes: 0 %
Lymphocytes Relative: 31 %
Lymphs Abs: 2.1 10*3/uL (ref 0.7–4.0)
MCH: 30.8 pg (ref 26.0–34.0)
MCHC: 33.7 g/dL (ref 30.0–36.0)
MCV: 91.4 fL (ref 80.0–100.0)
Monocytes Absolute: 0.4 10*3/uL (ref 0.1–1.0)
Monocytes Relative: 5 %
Neutro Abs: 4 10*3/uL (ref 1.7–7.7)
Neutrophils Relative %: 59 %
Platelets: 298 10*3/uL (ref 150–400)
RBC: 4.64 MIL/uL (ref 3.87–5.11)
RDW: 13.1 % (ref 11.5–15.5)
Smear Review: NORMAL
WBC: 6.7 10*3/uL (ref 4.0–10.5)
nRBC: 0 % (ref 0.0–0.2)

## 2020-11-06 LAB — RESP PANEL BY RT-PCR (FLU A&B, COVID) ARPGX2
Influenza A by PCR: NEGATIVE
Influenza B by PCR: NEGATIVE
SARS Coronavirus 2 by RT PCR: NEGATIVE

## 2020-11-06 LAB — TROPONIN I (HIGH SENSITIVITY)
Troponin I (High Sensitivity): 12 ng/L (ref <18)
Troponin I (High Sensitivity): 11 ng/L (ref <18)
Troponin I (High Sensitivity): 7 ng/L (ref <18)

## 2020-11-06 LAB — COMPREHENSIVE METABOLIC PANEL
ALT: 12 U/L (ref 0–44)
AST: 13 U/L — ABNORMAL LOW (ref 15–41)
Albumin: 3.5 g/dL (ref 3.5–5.0)
Alkaline Phosphatase: 98 U/L (ref 38–126)
Anion gap: 10 (ref 5–15)
BUN: 9 mg/dL (ref 6–20)
CO2: 25 mmol/L (ref 22–32)
Calcium: 9.4 mg/dL (ref 8.9–10.3)
Chloride: 102 mmol/L (ref 98–111)
Creatinine, Ser: 0.6 mg/dL (ref 0.44–1.00)
GFR, Estimated: >60 mL/min (ref >60)
Glucose, Bld: 281 mg/dL — ABNORMAL HIGH (ref 70–99)
Potassium: 4.1 mmol/L (ref 3.5–5.1)
Sodium: 137 mmol/L (ref 135–145)
Total Bilirubin: 0.5 mg/dL (ref 0.3–1.2)
Total Protein: 7.5 g/dL (ref 6.5–8.1)

## 2020-11-06 LAB — PLATELET FUNCTION ASSAY
Collagen / ADP: 102 seconds (ref 0–118)
Collagen / Epinephrine: 290 seconds — ABNORMAL HIGH (ref 0–193)

## 2020-11-06 LAB — MAGNESIUM: Magnesium: 1.7 mg/dL (ref 1.7–2.4)

## 2020-11-06 LAB — HEMOGLOBIN A1C
Hgb A1c MFr Bld: 11.1 % — ABNORMAL HIGH (ref 4.8–5.6)
Mean Plasma Glucose: 271.87 mg/dL

## 2020-11-06 LAB — TYPE AND SCREEN
ABO/RH(D): A POS
Antibody Screen: NEGATIVE

## 2020-11-06 LAB — BRAIN NATRIURETIC PEPTIDE: B Natriuretic Peptide: 52.7 pg/mL (ref 0.0–100.0)

## 2020-11-06 LAB — GLUCOSE, CAPILLARY
Glucose-Capillary: 308 mg/dL — ABNORMAL HIGH (ref 70–99)
Glucose-Capillary: 261 mg/dL — ABNORMAL HIGH (ref 70–99)

## 2020-11-06 LAB — PROTIME-INR
INR: 1 (ref 0.8–1.2)
Prothrombin Time: 12.7 s (ref 11.4–15.2)

## 2020-11-06 LAB — APTT: aPTT: 27 seconds (ref 24–36)

## 2020-11-06 SURGERY — ANTERIOR CERVICAL DECOMPRESSION/DISCECTOMY FUSION 2 LEVELS
Anesthesia: General

## 2020-11-06 MED ORDER — PHENYLEPHRINE HCL (PRESSORS) 10 MG/ML IV SOLN
INTRAVENOUS | Status: DC | PRN
  Administered 2020-11-06 (×3): 100 ug via INTRAVENOUS

## 2020-11-06 MED ORDER — NITROGLYCERIN 0.4 MG SL SUBL
0.4000 mg | SUBLINGUAL_TABLET | SUBLINGUAL | Status: DC | PRN
Start: 2020-11-06 — End: 2020-11-14

## 2020-11-06 MED ORDER — MIDAZOLAM HCL 2 MG/2ML IJ SOLN
INTRAMUSCULAR | Status: AC
  Filled 2020-11-06: qty 2

## 2020-11-06 MED ORDER — HYDROMORPHONE HCL 1 MG/ML IJ SOLN
INTRAMUSCULAR | Status: AC
  Administered 2020-11-06: 0.5 mg via INTRAVENOUS
  Filled 2020-11-06: qty 1

## 2020-11-06 MED ORDER — PHENYLEPHRINE HCL (PRESSORS) 10 MG/ML IV SOLN
INTRAVENOUS | Status: AC
  Filled 2020-11-06: qty 1

## 2020-11-06 MED ORDER — LIDOCAINE HCL 4 % MT SOLN
OROMUCOSAL | Status: DC | PRN
  Administered 2020-11-06: 4 mL via TOPICAL

## 2020-11-06 MED ORDER — HYDROCORTISONE NA SUCCINATE PF 250 MG IJ SOLR
200.0000 mg | Freq: Once | INTRAMUSCULAR | Status: AC
  Administered 2020-11-06: 200 mg via INTRAVENOUS
  Filled 2020-11-06: qty 200

## 2020-11-06 MED ORDER — FENTANYL CITRATE (PF) 100 MCG/2ML IJ SOLN
25.0000 ug | INTRAMUSCULAR | Status: DC | PRN
  Administered 2020-11-06 (×5): 25 ug via INTRAVENOUS

## 2020-11-06 MED ORDER — FENTANYL CITRATE (PF) 100 MCG/2ML IJ SOLN
INTRAMUSCULAR | Status: AC
  Administered 2020-11-06: 25 ug via INTRAVENOUS
  Filled 2020-11-06: qty 2

## 2020-11-06 MED ORDER — MORPHINE SULFATE (PF) 4 MG/ML IV SOLN
4.0000 mg | Freq: Once | INTRAVENOUS | Status: AC
  Administered 2020-11-06: 4 mg via INTRAVENOUS
  Filled 2020-11-06: qty 1

## 2020-11-06 MED ORDER — HYDRALAZINE HCL 20 MG/ML IJ SOLN: 5.0000 mg | INTRAMUSCULAR | Status: DC | PRN

## 2020-11-06 MED ORDER — IOHEXOL 350 MG/ML SOLN
75.0000 mL | Freq: Once | INTRAVENOUS | Status: AC | PRN
  Administered 2020-11-06: 75 mL via INTRAVENOUS

## 2020-11-06 MED ORDER — CEFAZOLIN SODIUM 1 G IJ SOLR
INTRAMUSCULAR | Status: AC
  Filled 2020-11-06: qty 20

## 2020-11-06 MED ORDER — ONDANSETRON HCL 4 MG/2ML IJ SOLN: 4.0000 mg | Freq: Three times a day (TID) | INTRAMUSCULAR | Status: DC | PRN

## 2020-11-06 MED ORDER — PANTOPRAZOLE SODIUM 40 MG PO TBEC
40.0000 mg | DELAYED_RELEASE_TABLET | Freq: Every day | ORAL | Status: DC
  Administered 2020-11-07 – 2020-11-14 (×8): 40 mg via ORAL
  Filled 2020-11-06 (×8): qty 1

## 2020-11-06 MED ORDER — ROCURONIUM BROMIDE 10 MG/ML (PF) SYRINGE
PREFILLED_SYRINGE | INTRAVENOUS | Status: AC
  Filled 2020-11-06: qty 20

## 2020-11-06 MED ORDER — OXYCODONE HCL ER 15 MG PO T12A
80.0000 mg | EXTENDED_RELEASE_TABLET | Freq: Three times a day (TID) | ORAL | Status: DC
  Administered 2020-11-06 – 2020-11-14 (×20): 80 mg via ORAL
  Filled 2020-11-06 (×9): qty 2
  Filled 2020-11-06: qty 4
  Filled 2020-11-06 (×7): qty 2
  Filled 2020-11-06: qty 4
  Filled 2020-11-06 (×4): qty 2
  Filled 2020-11-06: qty 4

## 2020-11-06 MED ORDER — MIRTAZAPINE 15 MG PO TABS
15.0000 mg | ORAL_TABLET | Freq: Every day | ORAL | Status: DC
  Administered 2020-11-06 – 2020-11-13 (×8): 15 mg via ORAL
  Filled 2020-11-06 (×9): qty 1

## 2020-11-06 MED ORDER — KETAMINE HCL 50 MG/ML IJ SOLN
INTRAMUSCULAR | Status: DC | PRN
  Administered 2020-11-06 (×2): 25 mg via INTRAVENOUS

## 2020-11-06 MED ORDER — HYDROMORPHONE HCL 1 MG/ML IJ SOLN
0.5000 mg | INTRAMUSCULAR | Status: AC | PRN
  Administered 2020-11-06 (×2): 0.5 mg via INTRAVENOUS

## 2020-11-06 MED ORDER — ONDANSETRON HCL 4 MG/2ML IJ SOLN: 4.0000 mg | Freq: Once | INTRAMUSCULAR | Status: DC | PRN

## 2020-11-06 MED ORDER — MIDAZOLAM HCL 2 MG/2ML IJ SOLN
1.0000 mg | INTRAMUSCULAR | Status: AC | PRN
  Administered 2020-11-06: 1 mg via INTRAVENOUS
  Filled 2020-11-06: qty 2

## 2020-11-06 MED ORDER — EPHEDRINE 5 MG/ML INJ
INTRAVENOUS | Status: AC
  Filled 2020-11-06: qty 10

## 2020-11-06 MED ORDER — CEFAZOLIN SODIUM-DEXTROSE 2-3 GM-%(50ML) IV SOLR
INTRAVENOUS | Status: DC | PRN
  Administered 2020-11-06: 2 g via INTRAVENOUS

## 2020-11-06 MED ORDER — OXYCODONE HCL 5 MG PO TABS
30.0000 mg | ORAL_TABLET | ORAL | Status: DC | PRN
  Administered 2020-11-06 – 2020-11-14 (×23): 30 mg via ORAL
  Filled 2020-11-06 (×23): qty 6

## 2020-11-06 MED ORDER — GLYCOPYRROLATE 0.2 MG/ML IJ SOLN
INTRAMUSCULAR | Status: DC | PRN
  Administered 2020-11-06: .2 mg via INTRAVENOUS

## 2020-11-06 MED ORDER — DIPHENHYDRAMINE HCL 25 MG PO CAPS
50.0000 mg | ORAL_CAPSULE | Freq: Once | ORAL | Status: AC
  Administered 2020-11-06: 50 mg via ORAL

## 2020-11-06 MED ORDER — AMITRIPTYLINE HCL 25 MG PO TABS
75.0000 mg | ORAL_TABLET | Freq: Every day | ORAL | Status: DC
  Administered 2020-11-06 – 2020-11-13 (×8): 75 mg via ORAL
  Filled 2020-11-06 (×8): qty 3

## 2020-11-06 MED ORDER — SUCRALFATE 1 G PO TABS
1.0000 g | ORAL_TABLET | Freq: Four times a day (QID) | ORAL | Status: DC
  Administered 2020-11-07 – 2020-11-14 (×28): 1 g via ORAL
  Filled 2020-11-06 (×29): qty 1

## 2020-11-06 MED ORDER — SUGAMMADEX SODIUM 500 MG/5ML IV SOLN
INTRAVENOUS | Status: AC
  Filled 2020-11-06: qty 5

## 2020-11-06 MED ORDER — ONDANSETRON HCL 4 MG/2ML IJ SOLN
4.0000 mg | Freq: Once | INTRAMUSCULAR | Status: AC
  Administered 2020-11-06: 4 mg via INTRAVENOUS
  Filled 2020-11-06: qty 2

## 2020-11-06 MED ORDER — DIPHENHYDRAMINE HCL 25 MG PO CAPS: 50.0000 mg | ORAL_CAPSULE | Freq: Once | ORAL | Status: DC

## 2020-11-06 MED ORDER — INSULIN ASPART PROT & ASPART (70-30 MIX) 100 UNIT/ML ~~LOC~~ SUSP: 9.0000 [IU] | Freq: Two times a day (BID) | SUBCUTANEOUS | Status: DC

## 2020-11-06 MED ORDER — ASPIRIN EC 81 MG PO TBEC
81.0000 mg | DELAYED_RELEASE_TABLET | Freq: Every day | ORAL | Status: DC
  Administered 2020-11-07 – 2020-11-14 (×8): 81 mg via ORAL
  Filled 2020-11-06 (×8): qty 1

## 2020-11-06 MED ORDER — LIDOCAINE HCL (PF) 2 % IJ SOLN
INTRAMUSCULAR | Status: AC
  Filled 2020-11-06: qty 5

## 2020-11-06 MED ORDER — RANOLAZINE ER 500 MG PO TB12
500.0000 mg | ORAL_TABLET | Freq: Two times a day (BID) | ORAL | Status: DC
  Administered 2020-11-07 – 2020-11-13 (×14): 500 mg via ORAL
  Filled 2020-11-06 (×18): qty 1

## 2020-11-06 MED ORDER — SUCCINYLCHOLINE CHLORIDE 20 MG/ML IJ SOLN
INTRAMUSCULAR | Status: DC | PRN
  Administered 2020-11-06: 90 mg via INTRAVENOUS

## 2020-11-06 MED ORDER — DEXAMETHASONE SODIUM PHOSPHATE 10 MG/ML IJ SOLN
INTRAMUSCULAR | Status: AC
  Filled 2020-11-06: qty 1

## 2020-11-06 MED ORDER — ATORVASTATIN CALCIUM 20 MG PO TABS
80.0000 mg | ORAL_TABLET | Freq: Every day | ORAL | Status: DC
  Administered 2020-11-07 – 2020-11-11 (×5): 80 mg via ORAL
  Filled 2020-11-06: qty 4
  Filled 2020-11-06: qty 1
  Filled 2020-11-06 (×3): qty 4

## 2020-11-06 MED ORDER — THROMBIN 5000 UNITS EX SOLR
CUTANEOUS | Status: DC | PRN
  Administered 2020-11-06: 5000 [IU] via TOPICAL

## 2020-11-06 MED ORDER — MIDAZOLAM HCL 2 MG/2ML IJ SOLN
INTRAMUSCULAR | Status: DC | PRN
  Administered 2020-11-06: 2 mg via INTRAVENOUS

## 2020-11-06 MED ORDER — ONDANSETRON HCL 4 MG/2ML IJ SOLN
INTRAMUSCULAR | Status: DC | PRN
  Administered 2020-11-06: 4 mg via INTRAVENOUS

## 2020-11-06 MED ORDER — CLONAZEPAM 1 MG PO TABS
1.0000 mg | ORAL_TABLET | Freq: Three times a day (TID) | ORAL | Status: DC | PRN
  Administered 2020-11-06 – 2020-11-14 (×12): 1 mg via ORAL
  Filled 2020-11-06 (×12): qty 1

## 2020-11-06 MED ORDER — FAMOTIDINE 20 MG PO TABS
20.0000 mg | ORAL_TABLET | Freq: Two times a day (BID) | ORAL | Status: DC
  Administered 2020-11-06 – 2020-11-14 (×16): 20 mg via ORAL
  Filled 2020-11-06 (×16): qty 1

## 2020-11-06 MED ORDER — FENTANYL CITRATE (PF) 100 MCG/2ML IJ SOLN
INTRAMUSCULAR | Status: DC | PRN
  Administered 2020-11-06 (×5): 50 ug via INTRAVENOUS

## 2020-11-06 MED ORDER — ONDANSETRON HCL 4 MG/2ML IJ SOLN
4.0000 mg | INTRAMUSCULAR | Status: AC
  Administered 2020-11-06: 4 mg via INTRAVENOUS
  Filled 2020-11-06: qty 2

## 2020-11-06 MED ORDER — ACETAMINOPHEN 10 MG/ML IV SOLN
INTRAVENOUS | Status: AC
  Filled 2020-11-06: qty 100

## 2020-11-06 MED ORDER — SODIUM CHLORIDE 0.9 % IV SOLN
INTRAVENOUS | Status: DC | PRN
  Administered 2020-11-06: 50 ug/min via INTRAVENOUS

## 2020-11-06 MED ORDER — LIDOCAINE HCL (CARDIAC) PF 100 MG/5ML IV SOSY
PREFILLED_SYRINGE | INTRAVENOUS | Status: DC | PRN
  Administered 2020-11-06: 30 mg via INTRAVENOUS

## 2020-11-06 MED ORDER — FENTANYL CITRATE (PF) 100 MCG/2ML IJ SOLN
25.0000 ug | INTRAMUSCULAR | Status: DC | PRN
  Administered 2020-11-06: 25 ug via INTRAVENOUS
  Filled 2020-11-06: qty 2

## 2020-11-06 MED ORDER — PROPOFOL 1000 MG/100ML IV EMUL
INTRAVENOUS | Status: AC
  Filled 2020-11-06: qty 100

## 2020-11-06 MED ORDER — SILVER SULFADIAZINE 1 % EX CREA
TOPICAL_CREAM | Freq: Every day | CUTANEOUS | Status: DC
  Administered 2020-11-08 – 2020-11-13 (×5): 1 via TOPICAL
  Filled 2020-11-06: qty 85

## 2020-11-06 MED ORDER — KETAMINE HCL 50 MG/ML IJ SOLN
INTRAMUSCULAR | Status: AC
  Filled 2020-11-06: qty 1

## 2020-11-06 MED ORDER — ALBUTEROL SULFATE HFA 108 (90 BASE) MCG/ACT IN AERS
2.0000 | INHALATION_SPRAY | RESPIRATORY_TRACT | Status: DC | PRN
  Filled 2020-11-06: qty 6.7

## 2020-11-06 MED ORDER — METHOCARBAMOL 500 MG PO TABS
500.0000 mg | ORAL_TABLET | Freq: Four times a day (QID) | ORAL | Status: DC | PRN
  Administered 2020-11-07: 500 mg via ORAL
  Filled 2020-11-06 (×2): qty 1

## 2020-11-06 MED ORDER — SODIUM CHLORIDE 0.9 % IV SOLN: INTRAVENOUS | Status: DC

## 2020-11-06 MED ORDER — ACETAMINOPHEN 325 MG PO TABS
650.0000 mg | ORAL_TABLET | Freq: Four times a day (QID) | ORAL | Status: DC | PRN
  Filled 2020-11-06 (×2): qty 2

## 2020-11-06 MED ORDER — DEXMEDETOMIDINE (PRECEDEX) IN NS 20 MCG/5ML (4 MCG/ML) IV SYRINGE
PREFILLED_SYRINGE | INTRAVENOUS | Status: AC
  Filled 2020-11-06: qty 5

## 2020-11-06 MED ORDER — DM-GUAIFENESIN ER 30-600 MG PO TB12
1.0000 | ORAL_TABLET | Freq: Two times a day (BID) | ORAL | Status: DC | PRN
  Administered 2020-11-09: 1 via ORAL
  Filled 2020-11-06 (×2): qty 1

## 2020-11-06 MED ORDER — INSULIN ASPART 100 UNIT/ML IJ SOLN
0.0000 [IU] | Freq: Three times a day (TID) | INTRAMUSCULAR | Status: DC
  Administered 2020-11-06 – 2020-11-07 (×2): 7 [IU] via SUBCUTANEOUS
  Administered 2020-11-07: 9 [IU] via SUBCUTANEOUS
  Administered 2020-11-07: 2 [IU] via SUBCUTANEOUS
  Administered 2020-11-08: 1 [IU] via SUBCUTANEOUS
  Administered 2020-11-08: 3 [IU] via SUBCUTANEOUS
  Administered 2020-11-08 – 2020-11-09 (×2): 1 [IU] via SUBCUTANEOUS
  Administered 2020-11-10: 3 [IU] via SUBCUTANEOUS
  Administered 2020-11-10: 1 [IU] via SUBCUTANEOUS
  Administered 2020-11-11: 2 [IU] via SUBCUTANEOUS
  Administered 2020-11-11: 3 [IU] via SUBCUTANEOUS
  Administered 2020-11-12: 7 [IU] via SUBCUTANEOUS
  Administered 2020-11-13 (×3): 9 [IU] via SUBCUTANEOUS
  Filled 2020-11-06 (×16): qty 1

## 2020-11-06 MED ORDER — PROPOFOL 10 MG/ML IV BOLUS
INTRAVENOUS | Status: DC | PRN
  Administered 2020-11-06: 120 mg via INTRAVENOUS

## 2020-11-06 MED ORDER — DEXAMETHASONE SODIUM PHOSPHATE 10 MG/ML IJ SOLN
INTRAMUSCULAR | Status: DC | PRN
  Administered 2020-11-06: 10 mg via INTRAVENOUS

## 2020-11-06 MED ORDER — DIPHENHYDRAMINE HCL 50 MG/ML IJ SOLN: 50.0000 mg | Freq: Once | INTRAMUSCULAR | Status: DC

## 2020-11-06 MED ORDER — INSULIN ISOPHANE & REGULAR (HUMAN 70-30)100 UNIT/ML KWIKPEN: 9.0000 [IU] | PEN_INJECTOR | Freq: Two times a day (BID) | SUBCUTANEOUS | Status: DC

## 2020-11-06 MED ORDER — BUPIVACAINE-EPINEPHRINE (PF) 0.5% -1:200000 IJ SOLN
INTRAMUSCULAR | Status: DC | PRN
  Administered 2020-11-06: 7 mL via PERINEURAL

## 2020-11-06 MED ORDER — DEXMEDETOMIDINE (PRECEDEX) IN NS 20 MCG/5ML (4 MCG/ML) IV SYRINGE
PREFILLED_SYRINGE | INTRAVENOUS | Status: DC | PRN
  Administered 2020-11-06: 8 ug via INTRAVENOUS
  Administered 2020-11-06: 4 ug via INTRAVENOUS

## 2020-11-06 MED ORDER — EPHEDRINE SULFATE 50 MG/ML IJ SOLN
INTRAMUSCULAR | Status: DC | PRN
  Administered 2020-11-06 (×2): 10 mg via INTRAVENOUS
  Administered 2020-11-06: 15 mg via INTRAVENOUS

## 2020-11-06 MED ORDER — GADOBUTROL 1 MMOL/ML IV SOLN
6.0000 mL | Freq: Once | INTRAVENOUS | Status: AC | PRN
  Administered 2020-11-06: 6 mL via INTRAVENOUS

## 2020-11-06 MED ORDER — ROCURONIUM BROMIDE 100 MG/10ML IV SOLN
INTRAVENOUS | Status: DC | PRN
  Administered 2020-11-06: 5 mg via INTRAVENOUS
  Administered 2020-11-06: 25 mg via INTRAVENOUS
  Administered 2020-11-06: 10 mg via INTRAVENOUS

## 2020-11-06 MED ORDER — BUPIVACAINE-EPINEPHRINE (PF) 0.5% -1:200000 IJ SOLN
INTRAMUSCULAR | Status: AC
  Filled 2020-11-06: qty 30

## 2020-11-06 MED ORDER — ONDANSETRON HCL 4 MG/2ML IJ SOLN
INTRAMUSCULAR | Status: AC
  Filled 2020-11-06: qty 2

## 2020-11-06 MED ORDER — FENTANYL CITRATE (PF) 250 MCG/5ML IJ SOLN
INTRAMUSCULAR | Status: AC
  Filled 2020-11-06: qty 5

## 2020-11-06 MED ORDER — THROMBIN 5000 UNITS EX SOLR
CUTANEOUS | Status: AC
  Filled 2020-11-06: qty 10000

## 2020-11-06 MED ORDER — INSULIN ASPART 100 UNIT/ML IJ SOLN
0.0000 [IU] | Freq: Every day | INTRAMUSCULAR | Status: DC
  Administered 2020-11-12 – 2020-11-13 (×2): 5 [IU] via SUBCUTANEOUS
  Filled 2020-11-06 (×2): qty 1

## 2020-11-06 SURGICAL SUPPLY — 56 items
BULB RESERV EVAC DRAIN JP 100C (MISCELLANEOUS) IMPLANT
BUR NEURO DRILL SOFT 3.0X3.8M (BURR) ×2 IMPLANT
CHLORAPREP W/TINT 26 (MISCELLANEOUS) ×4 IMPLANT
COUNTER NEEDLE 20/40 LG (NEEDLE) ×2 IMPLANT
COVER WAND RF STERILE (DRAPES) ×2 IMPLANT
DERMABOND ADVANCED (GAUZE/BANDAGES/DRESSINGS) ×1
DERMABOND ADVANCED .7 DNX12 (GAUZE/BANDAGES/DRESSINGS) ×1 IMPLANT
DRAIN CHANNEL JP 10F RND 20C F (MISCELLANEOUS) IMPLANT
DRAPE C ARM PK CFD 31 SPINE (DRAPES) ×2 IMPLANT
DRAPE C-ARM XRAY 36X54 (DRAPES) ×2 IMPLANT
DRAPE LAPAROTOMY 77X122 PED (DRAPES) ×2 IMPLANT
DRAPE MICROSCOPE SPINE 48X150 (DRAPES) ×2 IMPLANT
DRAPE SURG 17X11 SM STRL (DRAPES) ×8 IMPLANT
ELECT CAUTERY BLADE TIP 2.5 (TIP) ×4
ELECT REM PT RETURN 9FT ADLT (ELECTROSURGICAL) ×2
ELECTRODE CAUTERY BLDE TIP 2.5 (TIP) ×2 IMPLANT
ELECTRODE REM PT RTRN 9FT ADLT (ELECTROSURGICAL) ×1 IMPLANT
FEE INTRAOP CADWELL SUPPLY NCS (MISCELLANEOUS) ×1 IMPLANT
FEE INTRAOP MONITOR IMPULS NCS (MISCELLANEOUS) IMPLANT
GLOVE SURG SYN 8.5  E (GLOVE) ×3
GLOVE SURG SYN 8.5 E (GLOVE) ×3 IMPLANT
GOWN SRG XL LVL 3 NONREINFORCE (GOWNS) ×1 IMPLANT
GOWN STRL NON-REIN TWL XL LVL3 (GOWNS) ×1
GOWN STRL REUS W/ TWL XL LVL3 (GOWN DISPOSABLE) ×1 IMPLANT
GOWN STRL REUS W/TWL XL LVL3 (GOWN DISPOSABLE) ×1
GRADUATE 1200CC STRL 31836 (MISCELLANEOUS) ×2 IMPLANT
INTRAOP CADWELL SUPPLY FEE NCS (MISCELLANEOUS) ×1
INTRAOP DISP SUPPLY FEE NCS (MISCELLANEOUS) ×1
INTRAOP MONITOR FEE IMPULS NCS (MISCELLANEOUS)
INTRAOP MONITOR FEE IMPULSE (MISCELLANEOUS)
KIT TURNOVER KIT A (KITS) ×2 IMPLANT
MANIFOLD NEPTUNE II (INSTRUMENTS) ×2 IMPLANT
MARKER SKIN DUAL TIP RULER LAB (MISCELLANEOUS) ×4 IMPLANT
NEEDLE HYPO 22GX1.5 SAFETY (NEEDLE) ×2 IMPLANT
NS IRRIG 1000ML POUR BTL (IV SOLUTION) ×2 IMPLANT
PACK LAMINECTOMY NEURO (CUSTOM PROCEDURE TRAY) ×2 IMPLANT
PAD ARMBOARD 7.5X6 YLW CONV (MISCELLANEOUS) ×2 IMPLANT
PIN CASPAR 14 (PIN) ×1 IMPLANT
PIN CASPAR 14MM (PIN) ×2
PLATE ANT CERV XTEND 2 LV 28 (Plate) ×2 IMPLANT
PUTTY DBX 1CC (Putty) ×2 IMPLANT
PUTTY DBX 1CC DEPUY (Putty) ×1 IMPLANT
SCREW VAR 4.2 XD SELF DRILL 14 (Screw) ×12 IMPLANT
SPACER C HEDRON 12X14 7M 7D (Spacer) ×4 IMPLANT
SPOGE SURGIFLO 8M (HEMOSTASIS) ×2
SPONGE KITTNER 5P (MISCELLANEOUS) ×4 IMPLANT
SPONGE SURGIFLO 8M (HEMOSTASIS) ×2 IMPLANT
SUT SILK 2 0 (SUTURE)
SUT SILK 2-0 18XBRD TIE 12 (SUTURE) IMPLANT
SUT V-LOC 90 ABS DVC 3-0 CL (SUTURE) ×2 IMPLANT
SUT VIC AB 3-0 SH 8-18 (SUTURE) ×2 IMPLANT
SYR 30ML LL (SYRINGE) ×2 IMPLANT
TAPE CLOTH 3X10 WHT NS LF (GAUZE/BANDAGES/DRESSINGS) ×2 IMPLANT
TOWEL OR 17X26 4PK STRL BLUE (TOWEL DISPOSABLE) ×8 IMPLANT
TRAY FOLEY MTR SLVR 16FR STAT (SET/KITS/TRAYS/PACK) IMPLANT
TUBING CONNECTING 10 (TUBING) ×2 IMPLANT

## 2020-11-06 NOTE — ED Notes (Signed)
RN tried to hook pt back up to the monitor and BP cuff and pt refused.

## 2020-11-06 NOTE — ED Notes (Signed)
Assisted pt to ambulate to toilet. Then placed back in bed,.

## 2020-11-06 NOTE — H&P (View-Only) (Signed)
Referring Physician:  No referring provider defined for this encounter.  Primary Physician:  Olin Hauser, DO  Chief Complaint:  L arm weakness  History of Present Illness: 11/06/2020 Renee Mack is a 55 y.o. female who presents with the chief complaint of L arm weakness. Over the past day, she has developed worsening numbness and weakness of her left arm.  She also has discomfort in her chest, and has had problems with her balance over the past couple of weeks.  She was doing well prior to that.  She has developed tingling in her hands, worse on the left.  She has had worsening grip strength and dexterity. She is also having bad neck pain, which has worsened recently..  Of note, she is currently being treated for vulvular cancer.  She is on palliative care for pain.  The symptoms are causing a significant impact on the patient's life.   Review of Systems:  A 10 point review of systems is negative, except for the pertinent positives and negatives detailed in the HPI.  Past Medical History: Past Medical History:  Diagnosis Date  . Cancer ([place])    vulvular  . Cervical disc disease    a. 01/2018 MRI Cervical spine: Cervical spondylosis with multilevel disc and facet degeneration greatest at C5-6 and C6-7.  Moderate to sev R C5-6, mod L C5-6, and mod bilat C6-7 foraminal stenosis with multilevel mild foraminal stenosis.  Mild C5-6 and mod C6-7 canal stenosis. C6-7 central cord impingement w/ cord flattening.  . Chronic combined systolic (congestive) and diastolic (congestive) heart failure ([place])    a. 12/2017 Echo: EF 30-35%, mid-apicalanteroseptal, ant, and apical AK. Gr1 DD. Mild conc LVH; b. 03/2018 Echo: EF 45-50%, antsept, ant HK. Mild MR. Nl LA size. Nl RV fxn.  Marland Kitchen COPD (chronic obstructive pulmonary disease) (H[facility])    not on home oxygen  . Coronary artery disease    a. 12/2017 ACS/PCI: LAD 100p (2.25x26 Resolute Onyx DES), 72m (2.0x12 Resolute Onyx DES), 80d (2.0x15  Resolute Onyx DES), RCA 90p (non-dominant). EF 25-35%. Post-MI course complicated by CGS; b. [id number] NSTEMI/subacute thrombosis-->LAD 100 (PTCA + DES x 1); c. 01/2018 NSTEMI/PCI: LM min irregs, LAD 50-60p ISR/hazy (3.5x12 Resolute DES), 30m/d (underexpansion of prior stents-->PTCA). EF 45-50%.  Marland Kitchen GERD (gastroesophageal reflux disease)   . H/O degenerative disc disease   . Hypotension   . Ischemic cardiomyopathy    a. 12/2017 Echo: EF 30-35%; b. 03/2018 Echo: EF 45-50%.  . Myocardial infarction ([place])    a. 12/2017-->DES to LAD x 3.  . Pancreatitis   . Shingles   . Spinal stenosis   . Tobacco abuse   . Ulcer (traumatic) of oral mucosa   . Urinary incontinence   . VIN III (vulvar intraepithelial neoplasia III)     Past Surgical History: Past Surgical History:  Procedure Laterality Date  . ABDOMINAL HYSTERECTOMY     partial  . CORONARY BALLOON ANGIOPLASTY N/A 05/22/2018   Procedure: CORONARY BALLOON ANGIOPLASTY;  Surgeon: Wellington Hampshire, MD;  Location: [facility];  Service: Cardiovascular;  Laterality: N/A;  . CORONARY STENT INTERVENTION N/A 12/26/2017   Procedure: CORONARY STENT INTERVENTION;  Surgeon: Wellington Hampshire, MD;  Location: [facility];  Service: Cardiovascular;  Laterality: N/A;  . CORONARY STENT INTERVENTION N/A 02/08/2018   Procedure: CORONARY STENT INTERVENTION;  Surgeon: Nelva Bush, MD;  Location: [facility];  Service: Cardiovascular;  Laterality: N/A;  . CORONARY/GRAFT ACUTE MI REVASCULARIZATION N/A 12/19/2017  Procedure: Coronary/Graft Acute MI Revascularization;  Surgeon: Wellington Hampshire, MD;  Location: [facility];  Service: Cardiovascular;  Laterality: N/A;  . ECTOPIC PREGNANCY SURGERY Left   . INTRAVASCULAR ULTRASOUND/IVUS N/A 02/08/2018   Procedure: Intravascular Ultrasound/IVUS;  Surgeon: Nelva Bush, MD;  Location: [facility];  Service: Cardiovascular;  Laterality: N/A;  . LEFT HEART CATH AND CORONARY  ANGIOGRAPHY N/A 12/19/2017   Procedure: LEFT HEART CATH AND CORONARY ANGIOGRAPHY;  Surgeon: Wellington Hampshire, MD;  Location: [facility];  Service: Cardiovascular;  Laterality: N/A;  . LEFT HEART CATH AND CORONARY ANGIOGRAPHY N/A 12/26/2017   Procedure: LEFT HEART CATH AND CORONARY ANGIOGRAPHY;  Surgeon: Wellington Hampshire, MD;  Location: [facility];  Service: Cardiovascular;  Laterality: N/A;  . LEFT HEART CATH AND CORONARY ANGIOGRAPHY N/A 02/08/2018   Procedure: LEFT HEART CATH AND CORONARY ANGIOGRAPHY;  Surgeon: Nelva Bush, MD;  Location: [facility];  Service: Cardiovascular;  Laterality: N/A;  . LEFT HEART CATH AND CORONARY ANGIOGRAPHY N/A 05/22/2018   Procedure: LEFT HEART CATH AND CORONARY ANGIOGRAPHY;  Surgeon: Wellington Hampshire, MD;  Location: [facility];  Service: Cardiovascular;  Laterality: N/A;  . LEFT HEART CATH AND CORONARY ANGIOGRAPHY N/A 06/15/2018   Procedure: LEFT HEART CATH AND CORONARY ANGIOGRAPHY;  Surgeon: Wellington Hampshire, MD;  Location: [facility];  Service: Cardiovascular;  Laterality: N/A;  . LYMPHADENECTOMY    . SPLENECTOMY, PARTIAL    . vulvulasectomy      Allergies: Allergies as of 11/06/2020 - Review Complete 11/06/2020  Allergen Reaction Noted  . Bee venom Itching, Shortness Of Breath, and Swelling 10/04/2012  . Metformin and related Shortness Of Breath and Swelling 12/27/2014  . Darvon [propoxyphene] Itching 12/27/2014  . Gabapentin Swelling 12/27/2014  . Nsaids Other (See Comments) 12/27/2014  . Tramadol Hives 04/29/2017  . Contrast media [iodinated diagnostic agents] Rash 05/24/2018    Medications:  Current Facility-Administered Medications:  .  morphine 4 MG/ML injection 4 mg, 4 mg, Intravenous, Once, Lavonia Drafts, MD .  ondansetron [facility]) injection 4 mg, 4 mg, Intravenous, Once, Lavonia Drafts, MD  Current Outpatient Medications:  .  albuterol (PROAIR HFA) 108 (90 Base) MCG/ACT inhaler, Inhale  1 puff into the lungs every 6 (six) hours as needed for wheezing or shortness of breath. , Disp: , Rfl:  .  amitriptyline (ELAVIL) 25 MG tablet, Take 1 tablet (25 mg total) by mouth at bedtime. (Patient taking differently: Take 75 mg by mouth at bedtime.), Disp: 30 tablet, Rfl: 2 .  aspirin 81 MG tablet, Take 81 mg by mouth daily., Disp: , Rfl:  .  atorvastatin (LIPITOR) 80 MG tablet, Take 1 tablet (80 mg total) by mouth daily at 6 PM., Disp: 90 tablet, Rfl: 0 .  Blood Glucose Monitoring Suppl (GLUCOCOM BLOOD GLUCOSE MONITOR) DEVI, 1 m., Disp: , Rfl:  .  clonazePAM (KLONOPIN) 1 MG tablet, Take 1 mg by mouth 3 (three) times daily as needed., Disp: , Rfl:  .  esomeprazole (NEXIUM) 40 MG capsule, Take 1 capsule (40 mg total) by mouth daily before breakfast., Disp: 90 capsule, Rfl: 0 .  famotidine (PEPCID) 20 MG tablet, Take 1 tablet (20 mg total) by mouth 2 (two) times daily., Disp: 180 tablet, Rfl: 0 .  insulin isophane & regular human (NOVOLIN 70/30 FLEXPEN) (70-30) 100 UNIT/ML KwikPen, Inject 18 Units into the skin 2 (two) times daily., Disp: 15 mL, Rfl: 11 .  Lancet Devices (SIMPLE DIAGNOSTICS LANCING  DEV) MISC, AS DIRECTED, Disp: , Rfl:  .  Lidocaine HCl 4 % GEL, Apply topically., Disp: , Rfl:  .  methocarbamol (ROBAXIN) 500 MG tablet, Take 1 tablet by mouth 4 (four) times daily as needed., Disp: , Rfl:  .  mirtazapine (REMERON) 15 MG tablet, Take 15 mg by mouth at bedtime., Disp: , Rfl:  .  nitroGLYCERIN (NITROSTAT) 0.4 MG SL tablet, Place 1 tablet (0.4 mg total) under the tongue every 5 (five) minutes as needed for chest pain., Disp: 10 tablet, Rfl: 0 .  ondansetron (ZOFRAN-ODT) 4 MG disintegrating tablet, Take 4 mg by mouth every 8 (eight) hours as needed., Disp: , Rfl:  .  oxyCODONE (OXYCONTIN) 80 mg 12 hr tablet, Take 1 tablet by mouth every 8 (eight) hours., Disp: , Rfl:  .  oxycodone (ROXICODONE) 30 MG immediate release tablet, Take 30 mg by mouth every 3 (three) hours as needed., Disp: ,  Rfl:  .  ranolazine (RANEXA) 500 MG 12 hr tablet, Take 1 tablet (500 mg total) by mouth 2 (two) times daily., Disp: 60 tablet, Rfl: 0 .  SSD 1 % cream, SMARTSIG:1 Topical 4-5 Times Daily, Disp: , Rfl:  .  sucralfate (CARAFATE) 1 g tablet, Take 1 g by mouth 4 (four) times daily., Disp: , Rfl:  .  ticagrelor (BRILINTA) 90 MG TABS tablet, Take 1 tablet (90 mg total) by mouth 2 (two) times daily. *NEEDS OFFICE VISIT FOR FURTHER REFILLS-PLEASE CALL [phone] TO SCHEDULE.*, Disp: 60 tablet, Rfl: 0   Social History: Social History   Tobacco Use  . Smoking status: Former Smoker    Packs/day: 0.50    Years: 30.00    Pack years: 15.00    Types: Cigarettes    Quit date: 12/19/2017    Years since quitting: 2.8  . Smokeless tobacco: Former Systems developer  . Tobacco comment: quit 12/19/2017.  Vaping Use  . Vaping Use: Never used  Substance Use Topics  . Alcohol use: No  . Drug use: No    Family Medical History: Family History  Problem Relation Age of Onset  . Non-Hodgkin's lymphoma Mother   . Osteoporosis Mother   . Lupus Sister   . Heart disease Brother   . Heart attack Brother   . Heart attack Father     Physical Examination: Vitals:   11/06/20 1200 11/06/20 1230  BP: 129/71 126/70  Pulse: 91 86  Resp: 18 17  Temp:    SpO2: 96% 94%     General: Patient is well developed, well nourished, calm, collected, and in no apparent distress.  Psychiatric: Patient is non-anxious.  Head:  Pupils equal, round, and reactive to light.  ENT:  Oral mucosa appears well hydrated.  Neck:   Supple.  Full range of motion.  Respiratory: Patient is breathing without any difficulty.  Extremities: No edema.  Vascular: Palpable pulses in dorsal pedal vessels.  Skin:   On exposed skin, there are no abnormal skin lesions.  NEUROLOGICAL:  General: In no acute distress.   Awake, alert, oriented to person, place, and time.  Pupils equal round and reactive to light.  Facial tone is symmetric.  Tongue  protrusion is midline.  There is left pronator drift.  ROM of spine: diminished.  Strength: Side Biceps Triceps Deltoid Interossei Grip Wrist Ext. Wrist Flex.  R 5 5 5 5 5 5 5   L 4 4- 4- 2 2 2  4+   Side Iliopsoas Quads Hamstring PF DF EHL  R 5 5 5  5  5 5  L 5 5 5 5 5 5     Bilateral upper and lower extremity sensation is intact to light touch with exception of L C6 and C7 distributions. Reflexes are 3+ and symmetric at the biceps, triceps, brachioradialis, patella and achilles. Hoffman's is absent.  Clonus is not present.   Gait is untested.  No difficulty with tandem gait.    Imaging: MRI C spine 11/06/20 C5-6: Posterior disc protrusion asymmetric to the right causing mild mass effect on the anterior cord surface and resulting in mild spinal canal stenosis, mildly progressed from prior MRI. Uncovertebral and facet degenerative changes resulting moderate right and severe left neural foraminal narrowing.  C6-7: Posterior disc protrusion mildly asymmetric to the left causing mass effect on the cord without cord signal abnormality and resulting in moderate to severe spinal canal stenosis, progressed from prior MRI. Uncovertebral and facet degenerative changes resulting moderate right and severe left neural foraminal narrowing.  C7-T1: Facet degenerative changes. No spinal canal or neural foraminal stenosis.  IMPRESSION: 1. Degenerative changes of the cervical spine, more pronounced at C6-7 where there is moderate to severe spinal canal stenosis with mass effect on the cord, progressed from prior MRI. No cord signal abnormality. 2. Mild spinal canal stenosis at C5-6, mildly progressed from prior MRI. 3. Multilevel neural foraminal narrowing with moderate right and severe left neural foraminal narrowing at C5-6 and C6-7.   Electronically Signed   By: Pedro Earls M.D.   On: 11/06/2020 10:23  I have personally reviewed the images and agree with the  above interpretation.  Labs: CBC Latest Ref Rng & Units 11/06/2020 10/13/2020 10/12/2020  WBC 4.0 - 10.5 K/uL 6.7 6.8 6.6  Hemoglobin 12.0 - 15.0 g/dL 14.3 14.2 13.3  Hematocrit 36.0 - 46.0 % 42.4 45.8 40.4  Platelets 150 - 400 K/uL 298 187 309       Assessment and Plan: Ms. Tatem is a pleasant 55 y.o. female with acutely progressive myeloradiculopathy.  She developed worsening weakness of her L arm.   Due to her worsening weakness, I recommended surgical intervention (C5-7 ACDF). We will need to check her platelet function.  I discussed the planned procedure at length with the patient, including the risks, benefits, alternatives, and indications. The risks discussed include but are not limited to bleeding, infection, need for reoperation, spinal fluid leak, stroke, vision loss, anesthetic complication, coma, paralysis, and even death. We also discussed the possibility of post-operative dysphagia, vocal cord paralysis, and the risk of adjacent segment disease in the future. I also described in detail that improvement was not guaranteed.  The patient expressed understanding of these risks, and asked that we proceed with surgery. I described the surgery in layman's terms, and gave ample opportunity for questions, which were answered to the best of my ability.   Tandre Conly K. Izora Ribas MD, [facility]

## 2020-11-06 NOTE — Anesthesia Procedure Notes (Signed)
Procedure Name: Intubation Performed by: Otilio Groleau, CRNA Pre-anesthesia Checklist: Patient identified, Patient being monitored, Timeout performed, Emergency Drugs available and Suction available Patient Re-evaluated:Patient Re-evaluated prior to induction Oxygen Delivery Method: Circle system utilized Preoxygenation: Pre-oxygenation with 100% oxygen Induction Type: IV induction and Rapid sequence Laryngoscope Size: 3 and McGraph Grade View: Grade I Tube type: Oral Tube size: 7.0 mm Number of attempts: 1 Airway Equipment and Method: Stylet and Video-laryngoscopy Placement Confirmation: ETT inserted through vocal cords under direct vision,  positive ETCO2 and breath sounds checked- equal and bilateral Secured at: 21 cm Tube secured with: Tape Dental Injury: Teeth and Oropharynx as per pre-operative assessment        

## 2020-11-06 NOTE — ED Notes (Signed)
Pt back from MRI 

## 2020-11-06 NOTE — ED Notes (Signed)
Pt to MRI

## 2020-11-06 NOTE — Interval H&P Note (Signed)
History and Physical Interval Note:  11/06/2020 4:37 PM  Renee Mack  has presented today for surgery, with the diagnosis of Spinal Injury C5-C7.  The various methods of treatment have been discussed with the patient and family. After consideration of risks, benefits and other options for treatment, the patient has consented to  Procedure(s): ANTERIOR CERVICAL DECOMPRESSION/DISCECTOMY FUSION 2 LEVELS C5-C7 (N/A) as a surgical intervention.  The patient's history has been reviewed, patient examined, no change in status, stable for surgery.  I have reviewed the patient's chart and labs.  Questions were answered to the patient's satisfaction.    Heart sounds normal no MRG. Chest Clear to Auscultation Bilaterally.     [name]

## 2020-11-06 NOTE — Op Note (Signed)
Indications: Ms. Renee Mack is a 55 yo female who presented with cervical myelopathy with radiculopathy and left arm weakness.  She developed worsening weakness prompting ER visit.  Findings: stenosis C5-7  Preoperative Diagnosis: Cervical myelopathy with radiculopathy, left arm weakness Postoperative Diagnosis: same   EBL: 20 ml IVF: 800 ml Drains: none Disposition: Extubated and Stable to PACU Complications: none  No foley catheter was placed.   Preoperative Note:   Risks of surgery discussed include: infection, bleeding, stroke, coma, death, paralysis, CSF leak, nerve/spinal cord injury, numbness, tingling, weakness, complex regional pain syndrome, recurrent stenosis and/or disc herniation, vascular injury, development of instability, neck/back pain, need for further surgery, persistent symptoms, development of deformity, and the risks of anesthesia. The patient understood these risks and agreed to proceed.  Operative Note:   Procedure:  1) Anterior cervical diskectomy and fusion at C5/6 and C6/7 2) Anterior cervical instrumentation at C5 - 7 using Globus Xtend 3) Placement of biomechanical devices at C5/6 and C6/7  4) Use of operative microscope 5) Use of flouroscopy   Procedure: After obtaining informed consent, the patient taken to the operating room, placed in supine position, general anesthesia induced.  The patient had a small shoulder roll placed behind their shoulders.  The patient received preop antibiotics and IV Decadron.  The patient had a neck incision outlined, was prepped and draped in usual sterile fashion. The incision was injected with local anesthetic.   An incision was opened, dissection taken down medial to the carotid artery and jugular vein, lateral to the trachea and esophagus.  The prevertebral fascia identified and a localizing x-ray demonstrated the correct level.  The longus colli were dissected laterally, and self-retaining retractors placed to open the  operative field. The microscope was then brought into the field.  With this complete, distractor pins were placed in the vertebral bodies of C5 and C7. The distractor was placed, and the annuli at C5/6 and C6/7 were opened using a bovie.  Curettes and pituitary rongeurs used to remove the majority of disk, then the drill was used to remove the posterior osteophyte and begin the foraminotomies. The nerve hook was used to elevate the posterior longitudinal ligament, which was then removed with Kerrison rongeurs. The microblunt nerve hook could be passed out the foramina bilaterally at each level.   Meticulous hemostasis was obtained.  A biomechanical device (Globus Hedron 7 mm height x 14 mm width by 12 mm depth) was placed at C6/7. A second biomechanical device (Globus Hedron 7 mm height x 14 mm width by 12 mm depth) was placed at C5/6. Each device had been filled with allograft for aid in arthrodesis.  The caspar distractor was removed, and bone wax used for hemostasis. A 28 mm Globus Xtend plate was chosen.  Two screws placed in each vertebral body, respectively making sure the screws were behind the locking mechanism.  Final AP and lateral radiographs were taken.   With everything in good position, the wound was irrigated copiously with bacitracin-containing solution and meticulous hemostasis obtained.  Wound was closed in 2 layers using interrupted inverted 3-0 Vicryl sutures in the platysma and 3-0 monocryl on the dermis.  The wound was dressed with dermabond, the head of bed at 30 degrees, taken to recovery room in stable condition.  No new postop neurological deficits were identified.  Sponge and pattie counts were correct at the end of the procedure.   Meade Maw MD

## 2020-11-06 NOTE — ED Triage Notes (Signed)
Pt presents to ER via ems from home c/o chest pain and left arm weakness that started around 0400 today.  Pt reports pain starts in left side of chest and goes down left arm.  Pt states pain is pressure like in nature.  Pt has hx of MI with stents placed, vulvular cancer.  Pt also reports coffee ground emesis this morning.  Pt takes brillinta at home.  Pt A&O x4 at this time.

## 2020-11-06 NOTE — Anesthesia Preprocedure Evaluation (Addendum)
Anesthesia Evaluation  Patient identified by MRN, date of birth, ID band Patient awake    Reviewed: Allergy & Precautions, H&P , NPO status , Patient's Chart, lab work & pertinent test results, reviewed documented beta blocker date and time   Airway Mallampati: II  TM Distance: >3 FB Neck ROM: full    Dental  (+) Upper Dentures, Lower Dentures   Pulmonary neg pulmonary ROS, COPD, former smoker,    Pulmonary exam normal        Cardiovascular Exercise Tolerance: Poor + angina with exertion + CAD, + Past MI and +CHF  negative cardio ROS Normal cardiovascular exam Rhythm:regular Rate:Normal     Neuro/Psych PSYCHIATRIC DISORDERS Anxiety Depression No pain on AO extension. No signs of nerve compression. ja negative neurological ROS  negative psych ROS   GI/Hepatic negative GI ROS, Neg liver ROS, GERD  ,  Endo/Other  negative endocrine ROSdiabetes, Poorly Controlled, Type 1, Insulin Dependent  Renal/GU negative Renal ROS Bladder dysfunction   negative genitourinary   Musculoskeletal   Abdominal   Peds negative pediatric ROS (+)  Hematology negative hematology ROS (+)   Anesthesia Other Findings Past Medical History: No date: Cancer ([place])     Comment:  vulvular No date: Cervical disc disease     Comment:  a. 01/2018 MRI Cervical spine: Cervical spondylosis with               multilevel disc and facet degeneration greatest at C5-6               and C6-7.  Moderate to sev R C5-6, mod L C5-6, and mod               bilat C6-7 foraminal stenosis with multilevel mild               foraminal stenosis.  Mild C5-6 and mod C6-7 canal               stenosis. C6-7 central cord impingement w/ cord               flattening. No date: Chronic combined systolic (congestive) and diastolic  (congestive) heart failure ([place])     Comment:  a. 12/2017 Echo: EF 30-35%, mid-apicalanteroseptal, ant,               and apical AK. Gr1 DD. Mild  conc LVH; b. 03/2018 Echo: EF              45-50%, antsept, ant HK. Mild MR. Nl LA size. Nl RV fxn. No date: COPD (chronic obstructive pulmonary disease) (HCC)     Comment:  not on home oxygen No date: Coronary artery disease     Comment:  a. 12/2017 ACS/PCI: LAD 100p (2.25x26 Resolute Onyx DES),              71m (2.0x12 Resolute Onyx DES), 80d (2.0x15 Resolute Onyx              DES), RCA 90p (non-dominant). EF 25-35%. Post-MI course               complicated by CGS; b. 09/2351 NSTEMI/subacute               thrombosis-->LAD 100 (PTCA + DES x 1); c. 01/2018               NSTEMI/PCI: LM min irregs, LAD 50-60p ISR/hazy (3.5x12               Resolute DES), 61m/d (underexpansion  of prior               stents-->PTCA). EF 45-50%. No date: GERD (gastroesophageal reflux disease) No date: H/O degenerative disc disease No date: Hypotension No date: Ischemic cardiomyopathy     Comment:  a. 12/2017 Echo: EF 30-35%; b. 03/2018 Echo: EF 45-50%. No date: Myocardial infarction [facility])     Comment:  a. 12/2017-->DES to LAD x 3. No date: Pancreatitis No date: Shingles No date: Spinal stenosis No date: Tobacco abuse No date: Ulcer (traumatic) of oral mucosa No date: Urinary incontinence No date: VIN III (vulvar intraepithelial neoplasia III) Past Surgical History: No date: ABDOMINAL HYSTERECTOMY     Comment:  partial 05/22/2018: CORONARY BALLOON ANGIOPLASTY; N/A     Comment:  Procedure: CORONARY BALLOON ANGIOPLASTY;  Surgeon:               Wellington Hampshire, MD;  Location: [place] CV LAB;                Service: Cardiovascular;  Laterality: N/A; 12/26/2017: CORONARY STENT INTERVENTION; N/A     Comment:  Procedure: CORONARY STENT INTERVENTION;  Surgeon: Wellington Hampshire, MD;  Location: [place] CV LAB;                Service: Cardiovascular;  Laterality: N/A; 02/08/2018: CORONARY STENT INTERVENTION; N/A     Comment:  Procedure: CORONARY STENT INTERVENTION;  Surgeon: Nelva Bush, MD;  Location: [place] CV LAB;                Service: Cardiovascular;  Laterality: N/A; 12/19/2017: CORONARY/GRAFT ACUTE MI REVASCULARIZATION; N/A     Comment:  Procedure: Coronary/Graft Acute MI Revascularization;                Surgeon: Wellington Hampshire, MD;  Location: [place]               CV LAB;  Service: Cardiovascular;  Laterality: N/A; No date: ECTOPIC PREGNANCY SURGERY; Left 02/08/2018: INTRAVASCULAR ULTRASOUND/IVUS; N/A     Comment:  Procedure: Intravascular Ultrasound/IVUS;  Surgeon: Nelva Bush, MD;  Location: [place] CV LAB;                Service: Cardiovascular;  Laterality: N/A; 12/19/2017: LEFT HEART CATH AND CORONARY ANGIOGRAPHY; N/A     Comment:  Procedure: LEFT HEART CATH AND CORONARY ANGIOGRAPHY;                Surgeon: Wellington Hampshire, MD;  Location: [place]               CV LAB;  Service: Cardiovascular;  Laterality: N/A; 12/26/2017: LEFT HEART CATH AND CORONARY ANGIOGRAPHY; N/A     Comment:  Procedure: LEFT HEART CATH AND CORONARY ANGIOGRAPHY;                Surgeon: Wellington Hampshire, MD;  Location: [place]               CV LAB;  Service: Cardiovascular;  Laterality: N/A; 02/08/2018: LEFT HEART CATH AND CORONARY ANGIOGRAPHY; N/A     Comment:  Procedure: LEFT HEART CATH AND CORONARY ANGIOGRAPHY;                Surgeon: Nelva Bush, MD;  Location: [place]               CV LAB;  Service: Cardiovascular;  Laterality: N/A; 05/22/2018: LEFT HEART CATH AND CORONARY ANGIOGRAPHY; N/A     Comment:  Procedure: LEFT HEART CATH AND CORONARY ANGIOGRAPHY;                Surgeon: Wellington Hampshire, MD;  Location: [place]               CV LAB;  Service: Cardiovascular;  Laterality: N/A; 06/15/2018: LEFT HEART CATH AND CORONARY ANGIOGRAPHY; N/A     Comment:  Procedure: LEFT HEART CATH AND CORONARY ANGIOGRAPHY;                Surgeon: Wellington Hampshire, MD;  Location: [place]               CV LAB;   Service: Cardiovascular;  Laterality: N/A; No date: LYMPHADENECTOMY No date: SPLENECTOMY, PARTIAL No date: vulvulasectomy BMI    Body Mass Index: 24.56 kg/m     Reproductive/Obstetrics negative OB ROS                            Anesthesia Physical Anesthesia Plan  ASA: III and emergent  Anesthesia Plan: General ETT   Post-op Pain Management:    Induction: Intravenous  PONV Risk Score and Plan:   Airway Management Planned: Oral ETT  Additional Equipment:   Intra-op Plan:   Post-operative Plan: Extubation in OR  Informed Consent: I have reviewed the patients History and Physical, chart, labs and discussed the procedure including the risks, benefits and alternatives for the proposed anesthesia with the patient or authorized representative who has indicated his/her understanding and acceptance.     Dental advisory given  Plan Discussed with: CRNA and Surgeon  Anesthesia Plan Comments:        Anesthesia Quick Evaluation

## 2020-11-06 NOTE — ED Notes (Signed)
[name] called for update on pt. This RN called and no answer, no message left.

## 2020-11-06 NOTE — Transfer of Care (Signed)
Immediate Anesthesia Transfer of Care Note  Patient: Renee Mack  Procedure(s) Performed: ANTERIOR CERVICAL DECOMPRESSION/DISCECTOMY FUSION 2 LEVELS C5-C7 (N/A )  Patient Location: PACU  Anesthesia Type:General  Level of Consciousness: awake and alert   Airway & Oxygen Therapy: Patient Spontanous Breathing and Patient connected to face mask oxygen  Post-op Assessment: Report given to RN and Post -op Vital signs reviewed and stable  Post vital signs: Reviewed  Last Vitals:  Vitals Value Taken Time  BP 128/78 11/06/20 1939  Temp    Pulse 119 11/06/20 1940  Resp 21 11/06/20 1940  SpO2 99 % 11/06/20 1940  Vitals shown include unvalidated device data.  Last Pain:  Vitals:   11/06/20 1702  TempSrc: Oral  PainSc: 0-No pain      Patients Stated Pain Goal: 0 (81/15/72 6203)  Complications: No complications documented.

## 2020-11-06 NOTE — Consult Note (Signed)
 Referring Physician:  No referring provider defined for this encounter.  Primary Physician:  Karamalegos, Alexander J, DO  Chief Complaint:  L arm weakness  History of Present Illness: 11/06/2020 Renee Mack is a 55 y.o. female who presents with the chief complaint of L arm weakness. Over the past day, she has developed worsening numbness and weakness of her left arm.  She also has discomfort in her chest, and has had problems with her balance over the past couple of weeks.  She was doing well prior to that.  She has developed tingling in her hands, worse on the left.  She has had worsening grip strength and dexterity. She is also having bad neck pain, which has worsened recently..  Of note, she is currently being treated for vulvular cancer.  She is on palliative care for pain.  The symptoms are causing a significant impact on the patient's life.   Review of Systems:  A 10 point review of systems is negative, except for the pertinent positives and negatives detailed in the HPI.  Past Medical History: Past Medical History:  Diagnosis Date  . Cancer (HCC)    vulvular  . Cervical disc disease    a. 01/2018 MRI Cervical spine: Cervical spondylosis with multilevel disc and facet degeneration greatest at C5-6 and C6-7.  Moderate to sev R C5-6, mod L C5-6, and mod bilat C6-7 foraminal stenosis with multilevel mild foraminal stenosis.  Mild C5-6 and mod C6-7 canal stenosis. C6-7 central cord impingement w/ cord flattening.  . Chronic combined systolic (congestive) and diastolic (congestive) heart failure (HCC)    a. 12/2017 Echo: EF 30-35%, mid-apicalanteroseptal, ant, and apical AK. Gr1 DD. Mild conc LVH; b. 03/2018 Echo: EF 45-50%, antsept, ant HK. Mild MR. Nl LA size. Nl RV fxn.  . COPD (chronic obstructive pulmonary disease) (HCC)    not on home oxygen  . Coronary artery disease    a. 12/2017 ACS/PCI: LAD 100p (2.25x26 Resolute Onyx DES), 80m (2.0x12 Resolute Onyx DES), 80d (2.0x15  Resolute Onyx DES), RCA 90p (non-dominant). EF 25-35%. Post-MI course complicated by CGS; b. 12/2017 NSTEMI/subacute thrombosis-->LAD 100 (PTCA + DES x 1); c. 01/2018 NSTEMI/PCI: LM min irregs, LAD 50-60p ISR/hazy (3.5x12 Resolute DES), 60m/d (underexpansion of prior stents-->PTCA). EF 45-50%.  . GERD (gastroesophageal reflux disease)   . H/O degenerative disc disease   . Hypotension   . Ischemic cardiomyopathy    a. 12/2017 Echo: EF 30-35%; b. 03/2018 Echo: EF 45-50%.  . Myocardial infarction (HCC)    a. 12/2017-->DES to LAD x 3.  . Pancreatitis   . Shingles   . Spinal stenosis   . Tobacco abuse   . Ulcer (traumatic) of oral mucosa   . Urinary incontinence   . VIN III (vulvar intraepithelial neoplasia III)     Past Surgical History: Past Surgical History:  Procedure Laterality Date  . ABDOMINAL HYSTERECTOMY     partial  . CORONARY BALLOON ANGIOPLASTY N/A 05/22/2018   Procedure: CORONARY BALLOON ANGIOPLASTY;  Surgeon: Arida, Muhammad A, MD;  Location: [facility] INVASIVE CV LAB;  Service: Cardiovascular;  Laterality: N/A;  . CORONARY STENT INTERVENTION N/A 12/26/2017   Procedure: CORONARY STENT INTERVENTION;  Surgeon: Arida, Muhammad A, MD;  Location: [facility] INVAS[facility] CV LAB;  Service: Cardiovascular;  Laterality: N/A;  . CORONARY STENT INTERVENTION N/A 02/08/2018   Procedure: CORONARY STENT INTERVENTION;  Surgeon: End, Christopher, MD;  Location: [facility] INV[facility] CV LAB;  Service: Cardiovascular;  Laterality: N/A;  . CORONARY/GRAFT ACUTE MI REVASCULARIZATION N/A 12/19/2017     Procedure: Coronary/Graft Acute MI Revascularization;  Surgeon: Wellington Hampshire, MD;  Location: [place] CV LAB;  Service: Cardiovascular;  Laterality: N/A;  . ECTOPIC PREGNANCY SURGERY Left   . INTRAVASCULAR ULTRASOUND/IVUS N/A 02/08/2018   Procedure: Intravascular Ultrasound/IVUS;  Surgeon: Nelva Bush, MD;  Location: [place] [facility];  Service: Cardiovascular;  Laterality: N/A;  . LEFT HEART CATH AND CORONARY  ANGIOGRAPHY N/A 12/19/2017   Procedure: LEFT HEART CATH AND CORONARY ANGIOGRAPHY;  Surgeon: Wellington Hampshire, MD;  Location: [place] [facility] LAB;  Service: Cardiovascular;  Laterality: N/A;  . LEFT HEART CATH AND CORONARY ANGIOGRAPHY N/A 12/26/2017   Procedure: LEFT HEART CATH AND CORONARY ANGIOGRAPHY;  Surgeon: Wellington Hampshire, MD;  Location: [place] CV LAB;  Service: Cardiovascular;  Laterality: N/A;  . LEFT HEART CATH AND CORONARY ANGIOGRAPHY N/A 02/08/2018   Procedure: LEFT HEART CATH AND CORONARY ANGIOGRAPHY;  Surgeon: Nelva Bush, MD;  Location: [place] CV LAB;  Service: Cardiovascular;  Laterality: N/A;  . LEFT HEART CATH AND CORONARY ANGIOGRAPHY N/A 05/22/2018   Procedure: LEFT HEART CATH AND CORONARY ANGIOGRAPHY;  Surgeon: Wellington Hampshire, MD;  Location: [place] CV [facility];  Service: Cardiovascular;  Laterality: N/A;  . LEFT HEART CATH AND CORONARY ANGIOGRAPHY N/A 06/15/2018   Procedure: LEFT HEART CATH AND CORONARY ANGIOGRAPHY;  Surgeon: Wellington Hampshire, MD;  Location: [place] [facility];  Service: Cardiovascular;  Laterality: N/A;  . LYMPHADENECTOMY    . SPLENECTOMY, PARTIAL    . vulvulasectomy      Allergies: Allergies as of 11/06/2020 - Review Complete 11/06/2020  Allergen Reaction Noted  . Bee venom Itching, Shortness Of Breath, and Swelling 10/04/2012  . Metformin and related Shortness Of Breath and Swelling 12/27/2014  . Darvon [propoxyphene] Itching 12/27/2014  . Gabapentin Swelling 12/27/2014  . Nsaids Other (See Comments) 12/27/2014  . Tramadol Hives 04/29/2017  . Contrast media [iodinated diagnostic agents] Rash 05/24/2018    Medications:  Current Facility-Administered Medications:  .  morphine 4 MG/ML injection 4 mg, 4 mg, Intravenous, Once, Lavonia Drafts, MD .  ondansetron [facility]) injection 4 mg, 4 mg, Intravenous, Once, Lavonia Drafts, MD  Current Outpatient Medications:  .  albuterol (PROAIR HFA) 108 (90 Base) MCG/ACT inhaler, Inhale  1 puff into the lungs every 6 (six) hours as needed for wheezing or shortness of breath. , Disp: , Rfl:  .  amitriptyline (ELAVIL) 25 MG tablet, Take 1 tablet (25 mg total) by mouth at bedtime. (Patient taking differently: Take 75 mg by mouth at bedtime.), Disp: 30 tablet, Rfl: 2 .  aspirin 81 MG tablet, Take 81 mg by mouth daily., Disp: , Rfl:  .  atorvastatin (LIPITOR) 80 MG tablet, Take 1 tablet (80 mg total) by mouth daily at 6 PM., Disp: 90 tablet, Rfl: 0 .  Blood Glucose Monitoring Suppl (GLUCOCOM BLOOD GLUCOSE MONITOR) DEVI, 1 m., Disp: , Rfl:  .  clonazePAM (KLONOPIN) 1 MG tablet, Take 1 mg by mouth 3 (three) times daily as needed., Disp: , Rfl:  .  esomeprazole (NEXIUM) 40 MG capsule, Take 1 capsule (40 mg total) by mouth daily before breakfast., Disp: 90 capsule, Rfl: 0 .  famotidine (PEPCID) 20 MG tablet, Take 1 tablet (20 mg total) by mouth 2 (two) times daily., Disp: 180 tablet, Rfl: 0 .  insulin isophane & regular human (NOVOLIN 70/30 FLEXPEN) (70-30) 100 UNIT/ML KwikPen, Inject 18 Units into the skin 2 (two) times daily., Disp: 15 mL, Rfl: 11 .  Lancet Devices (SIMPLE DIAGNOSTICS LANCING  DEV) MISC, AS DIRECTED, Disp: , Rfl:  .  Lidocaine HCl 4 % GEL, Apply topically., Disp: , Rfl:  .  methocarbamol (ROBAXIN) 500 MG tablet, Take 1 tablet by mouth 4 (four) times daily as needed., Disp: , Rfl:  .  mirtazapine (REMERON) 15 MG tablet, Take 15 mg by mouth at bedtime., Disp: , Rfl:  .  nitroGLYCERIN (NITROSTAT) 0.4 MG SL tablet, Place 1 tablet (0.4 mg total) under the tongue every 5 (five) minutes as needed for chest pain., Disp: 10 tablet, Rfl: 0 .  ondansetron (ZOFRAN-ODT) 4 MG disintegrating tablet, Take 4 mg by mouth every 8 (eight) hours as needed., Disp: , Rfl:  .  oxyCODONE (OXYCONTIN) 80 mg 12 hr tablet, Take 1 tablet by mouth every 8 (eight) hours., Disp: , Rfl:  .  oxycodone (ROXICODONE) 30 MG immediate release tablet, Take 30 mg by mouth every 3 (three) hours as needed., Disp: ,  Rfl:  .  ranolazine (RANEXA) 500 MG 12 hr tablet, Take 1 tablet (500 mg total) by mouth 2 (two) times daily., Disp: 60 tablet, Rfl: 0 .  SSD 1 % cream, SMARTSIG:1 Topical 4-5 Times Daily, Disp: , Rfl:  .  sucralfate (CARAFATE) 1 g tablet, Take 1 g by mouth 4 (four) times daily., Disp: , Rfl:  .  ticagrelor (BRILINTA) 90 MG TABS tablet, Take 1 tablet (90 mg total) by mouth 2 (two) times daily. *NEEDS OFFICE VISIT FOR FURTHER REFILLS-PLEASE CALL [phone] TO SCHEDULE.*, Disp: 60 tablet, Rfl: 0   Social History: Social History   Tobacco Use  . Smoking status: Former Smoker    Packs/day: 0.50    Years: 30.00    Pack years: 15.00    Types: Cigarettes    Quit date: 12/19/2017    Years since quitting: 2.8  . Smokeless tobacco: Former Systems developer  . Tobacco comment: quit 12/19/2017.  Vaping Use  . Vaping Use: Never used  Substance Use Topics  . Alcohol use: No  . Drug use: No    Family Medical History: Family History  Problem Relation Age of Onset  . Non-Hodgkin's lymphoma Mother   . Osteoporosis Mother   . Lupus Sister   . Heart disease Brother   . Heart attack Brother   . Heart attack Father     Physical Examination: Vitals:   11/06/20 1200 11/06/20 1230  BP: 129/71 126/70  Pulse: 91 86  Resp: 18 17  Temp:    SpO2: 96% 94%     General: Patient is well developed, well nourished, calm, collected, and in no apparent distress.  Psychiatric: Patient is non-anxious.  Head:  Pupils equal, round, and reactive to light.  ENT:  Oral mucosa appears well hydrated.  Neck:   Supple.  Full range of motion.  Respiratory: Patient is breathing without any difficulty.  Extremities: No edema.  Vascular: Palpable pulses in dorsal pedal vessels.  Skin:   On exposed skin, there are no abnormal skin lesions.  NEUROLOGICAL:  General: In no acute distress.   Awake, alert, oriented to person, place, and time.  Pupils equal round and reactive to light.  Facial tone is symmetric.  Tongue  protrusion is midline.  There is left pronator drift.  ROM of spine: diminished.  Strength: Side Biceps Triceps Deltoid Interossei Grip Wrist Ext. Wrist Flex.  R 5 5 5 5 5 5 5   L 4 4- 4- 2 2 2  4+   Side Iliopsoas Quads Hamstring PF DF EHL  R 5 5 5  5  5 5  L 5 5 5 5 5 5     Bilateral upper and lower extremity sensation is intact to light touch with exception of L C6 and C7 distributions. Reflexes are 3+ and symmetric at the biceps, triceps, brachioradialis, patella and achilles. Hoffman's is absent.  Clonus is not present.   Gait is untested.  No difficulty with tandem gait.    Imaging: MRI C spine 11/06/20 C5-6: Posterior disc protrusion asymmetric to the right causing mild mass effect on the anterior cord surface and resulting in mild spinal canal stenosis, mildly progressed from prior MRI. Uncovertebral and facet degenerative changes resulting moderate right and severe left neural foraminal narrowing.  C6-7: Posterior disc protrusion mildly asymmetric to the left causing mass effect on the cord without cord signal abnormality and resulting in moderate to severe spinal canal stenosis, progressed from prior MRI. Uncovertebral and facet degenerative changes resulting moderate right and severe left neural foraminal narrowing.  C7-T1: Facet degenerative changes. No spinal canal or neural foraminal stenosis.  IMPRESSION: 1. Degenerative changes of the cervical spine, more pronounced at C6-7 where there is moderate to severe spinal canal stenosis with mass effect on the cord, progressed from prior MRI. No cord signal abnormality. 2. Mild spinal canal stenosis at C5-6, mildly progressed from prior MRI. 3. Multilevel neural foraminal narrowing with moderate right and severe left neural foraminal narrowing at C5-6 and C6-7.   Electronically Signed   By: Pedro Earls M.D.   On: 11/06/2020 10:23  I have personally reviewed the images and agree with the  above interpretation.  Labs: CBC Latest Ref Rng & Units 11/06/2020 10/13/2020 10/12/2020  WBC 4.0 - 10.5 K/uL 6.7 6.8 6.6  Hemoglobin 12.0 - 15.0 g/dL 14.3 14.2 13.3  Hematocrit 36.0 - 46.0 % 42.4 45.8 40.4  Platelets 150 - 400 K/uL 298 187 309       Assessment and Plan: Ms. Tatem is a pleasant 55 y.o. female with acutely progressive myeloradiculopathy.  She developed worsening weakness of her L arm.   Due to her worsening weakness, I recommended surgical intervention (C5-7 ACDF). We will need to check her platelet function.  I discussed the planned procedure at length with the patient, including the risks, benefits, alternatives, and indications. The risks discussed include but are not limited to bleeding, infection, need for reoperation, spinal fluid leak, stroke, vision loss, anesthetic complication, coma, paralysis, and even death. We also discussed the possibility of post-operative dysphagia, vocal cord paralysis, and the risk of adjacent segment disease in the future. I also described in detail that improvement was not guaranteed.  The patient expressed understanding of these risks, and asked that we proceed with surgery. I described the surgery in layman's terms, and gave ample opportunity for questions, which were answered to the best of my ability.   Emran Molzahn K. Izora Ribas MD, [place] [facility] of Neuro[facility]

## 2020-11-06 NOTE — ED Provider Notes (Signed)
[facility] Emergency Department Provider Note  ____________________________________________   Event Date/Time   First MD Initiated Contact with Patient 11/06/20 [phone]     (approximate)  I have reviewed the triage vital signs and the nursing notes.   HISTORY  Chief Complaint Chest Pain and Extremity Weakness  Level 5 caveat:  history/ROS limited by acute/critical illness  HPI Renee Mack is a 55 y.o. female    with extensive medical history including severe cardiac disease with multiple prior heart attacks and PCI.  She presents by EMS with severe chest pain and decreased function of her left arm.  The patient reports she was at her normal state of health until she awoke from sleep at 4:00 this morning (just over 2 hours ago) with severe sharp central chest pain that is making her a little bit short of breath.  At the same time that the chest pain started, she realized that her left arm is hurting but also has decreased sensation and she has decreased movement of the arm.  She can grip with her hand but in order to move her arm she has to reach across her body with her right arm to move the left.  This is very new for her.  She is not having any neck pain and has had no recent fall or injury, but the pain seems to be throughout her left arm.  She has had some nausea, no vomiting, no abdominal pain.  She has not passed out.  She takes Brilinta but no other blood thinners of which she is aware.  Nothing in particular makes the symptoms better or worse and all of her symptoms are severe.        Past Medical History:  Diagnosis Date  . Cancer ([place])    vulvular  . Cervical disc disease    a. 01/2018 MRI Cervical spine: Cervical spondylosis with multilevel disc and facet degeneration greatest at C5-6 and C6-7.  Moderate to sev R C5-6, mod L C5-6, and mod bilat C6-7 foraminal stenosis with multilevel mild foraminal stenosis.  Mild C5-6 and mod C6-7 canal  stenosis. C6-7 central cord impingement w/ cord flattening.  . Chronic combined systolic (congestive) and diastolic (congestive) heart failure ([place])    a. 12/2017 Echo: EF 30-35%, mid-apicalanteroseptal, ant, and apical AK. Gr1 DD. Mild conc LVH; b. 03/2018 Echo: EF 45-50%, antsept, ant HK. Mild MR. Nl LA size. Nl RV fxn.  Marland Kitchen COPD (chronic obstructive pulmonary disease) (HCC)    not on home oxygen  . Coronary artery disease    a. 12/2017 ACS/PCI: LAD 100p (2.25x26 Resolute Onyx DES), 52m (2.0x12 Resolute Onyx DES), 80d (2.0x15 Resolute Onyx DES), RCA 90p (non-dominant). EF 25-35%. Post-MI course complicated by CGS; b. 11/2701 NSTEMI/subacute thrombosis-->LAD 100 (PTCA + DES x 1); c. 01/2018 NSTEMI/PCI: LM min irregs, LAD 50-60p ISR/hazy (3.5x12 Resolute DES), 52m/d (underexpansion of prior stents-->PTCA). EF 45-50%.  Marland Kitchen GERD (gastroesophageal reflux disease)   . H/O degenerative disc disease   . Hypotension   . Ischemic cardiomyopathy    a. 12/2017 Echo: EF 30-35%; b. 03/2018 Echo: EF 45-50%.  . Myocardial infarction ([place])    a. 12/2017-->DES to LAD x 3.  . Pancreatitis   . Shingles   . Spinal stenosis   . Tobacco abuse   . Ulcer (traumatic) of oral mucosa   . Urinary incontinence   . VIN III (vulvar intraepithelial neoplasia III)     Patient Active Problem List   Diagnosis Date Noted  .  Intractable vomiting with nausea 10/10/2020  . Diabetic ketoacidosis ([place]) 03/30/2020  . DKA (diabetic ketoacidosis) ([place]) 03/30/2020  . Vulvar cancer ([place]) 07/27/2019  . Palliative care patient 09/04/2018  . Hyperglycemia 05/28/2018  . Vulvar intraepithelial neoplasia (VIN) grade 3 05/24/2018  . History of ST elevation myocardial infarction (STEMI) 02/27/2018  . CAD (coronary artery disease) 02/27/2018  . Hyperlipidemia associated with type 2 diabetes mellitus ([place]) 02/27/2018  . Anxiety associated with depression 02/27/2018  . Chronic systolic heart failure ([place])   . Chest pain 12/26/2017  . Unstable  angina ([place])   . Ischemic cardiomyopathy   . Former smoker   . Protein-calorie malnutrition, severe 07/18/2017  . DM (diabetes mellitus) ([place]) 12/28/2014  . Centrilobular emphysema ([place]) 12/28/2014  . GERD (gastroesophageal reflux disease) 12/28/2014  . History of shingles 12/28/2014  . Hyperlipidemia LDL goal <70 12/28/2014    Past Surgical History:  Procedure Laterality Date  . ABDOMINAL HYSTERECTOMY     partial  . CORONARY BALLOON ANGIOPLASTY N/A 05/22/2018   Procedure: CORONARY BALLOON ANGIOPLASTY;  Surgeon: Wellington Hampshire, MD;  Location: [place] [facility];  Service: Cardiovascular;  Laterality: N/A;  . CORONARY STENT INTERVENTION N/A 12/26/2017   Procedure: CORONARY STENT INTERVENTION;  Surgeon: Wellington Hampshire, MD;  Location: [place] [facility];  Service: Cardiovascular;  Laterality: N/A;  . CORONARY STENT INTERVENTION N/A 02/08/2018   Procedure: CORONARY STENT INTERVENTION;  Surgeon: Nelva Bush, MD;  Location: [place] [facility];  Service: Cardiovascular;  Laterality: N/A;  . CORONARY/GRAFT ACUTE MI REVASCULARIZATION N/A 12/19/2017   Procedure: Coronary/Graft Acute MI Revascularization;  Surgeon: Wellington Hampshire, MD;  Location: [place] [facility];  Service: Cardiovascular;  Laterality: N/A;  . ECTOPIC PREGNANCY SURGERY Left   . INTRAVASCULAR ULTRASOUND/IVUS N/A 02/08/2018   Procedure: Intravascular Ultrasound/IVUS;  Surgeon: Nelva Bush, MD;  Location: [place] [facility];  Service: Cardiovascular;  Laterality: N/A;  . LEFT HEART CATH AND CORONARY ANGIOGRAPHY N/A 12/19/2017   Procedure: LEFT HEART CATH AND CORONARY ANGIOGRAPHY;  Surgeon: Wellington Hampshire, MD;  Location: [place] [facility];  Service: Cardiovascular;  Laterality: N/A;  . LEFT HEART CATH AND CORONARY ANGIOGRAPHY N/A 12/26/2017   Procedure: LEFT HEART CATH AND CORONARY ANGIOGRAPHY;  Surgeon: Wellington Hampshire, MD;  Location: [place] [facility];  Service: Cardiovascular;  Laterality: N/A;  . LEFT  HEART CATH AND CORONARY ANGIOGRAPHY N/A 02/08/2018   Procedure: LEFT HEART CATH AND CORONARY ANGIOGRAPHY;  Surgeon: Nelva Bush, MD;  Location: [place] [facility];  Service: Cardiovascular;  Laterality: N/A;  . LEFT HEART CATH AND CORONARY ANGIOGRAPHY N/A 05/22/2018   Procedure: LEFT HEART CATH AND CORONARY ANGIOGRAPHY;  Surgeon: Wellington Hampshire, MD;  Location: [place] [facility];  Service: Cardiovascular;  Laterality: N/A;  . LEFT HEART CATH AND CORONARY ANGIOGRAPHY N/A 06/15/2018   Procedure: LEFT HEART CATH AND CORONARY ANGIOGRAPHY;  Surgeon: Wellington Hampshire, MD;  Location: [place] [facility];  Service: Cardiovascular;  Laterality: N/A;  . LYMPHADENECTOMY    . SPLENECTOMY, PARTIAL    . vulvulasectomy      Prior to Admission medications   Medication Sig Start Date End Date Taking? Authorizing Provider  albuterol (PROAIR HFA) 108 (90 Base) MCG/ACT inhaler Inhale 1 puff into the lungs every 6 (six) hours as needed for wheezing or shortness of breath.     [provider]  amitriptyline (ELAVIL) 25 MG tablet Take 1 tablet (25 mg total) by mouth at bedtime. Patient taking differently: Take 75  mg by mouth at bedtime. 04/05/20 05/05/20  ShahmehdiValeria Batman, MD  aspirin 81 MG tablet Take 81 mg by mouth daily.    [provider]  atorvastatin (LIPITOR) 80 MG tablet Take 1 tablet (80 mg total) by mouth daily at 6 PM. 03/05/19 03/30/20  Theora Gianotti, NP  Blood Glucose Monitoring Suppl (GLUCOCOM BLOOD GLUCOSE MONITOR) DEVI 1 m. 11/15/17   Karamalegos, Devonne Doughty, DO  clonazePAM (KLONOPIN) 1 MG tablet Take 1 mg by mouth 3 (three) times daily as needed. 03/19/20   [provider]  esomeprazole (NEXIUM) 40 MG capsule Take 1 capsule (40 mg total) by mouth daily before breakfast. 09/22/20   Parks Ranger, Devonne Doughty, DO  famotidine (PEPCID) 20 MG tablet Take 1 tablet (20 mg total) by mouth 2 (two) times daily. 09/22/20   Karamalegos, Devonne Doughty, DO  insulin  isophane & regular human (NOVOLIN 70/30 FLEXPEN) (70-30) 100 UNIT/ML KwikPen Inject 18 Units into the skin 2 (two) times daily. 04/05/20   Deatra James, MD  Lancet Devices (SIMPLE DIAGNOSTICS LANCING DEV) MISC AS DIRECTED 11/15/17   Parks Ranger Devonne Doughty, DO  Lidocaine HCl 4 % GEL Apply topically. 02/05/20   [provider]  methocarbamol (ROBAXIN) 500 MG tablet Take 1 tablet by mouth 4 (four) times daily as needed. 08/01/20   [provider]  mirtazapine (REMERON) 15 MG tablet Take 15 mg by mouth at bedtime. 07/07/18   [provider]  nitroGLYCERIN (NITROSTAT) 0.4 MG SL tablet Place 1 tablet (0.4 mg total) under the tongue every 5 (five) minutes as needed for chest pain. 06/16/18   Gouru, Illene Silver, MD  ondansetron (ZOFRAN-ODT) 4 MG disintegrating tablet Take 4 mg by mouth every 8 (eight) hours as needed. 11/27/19   [provider]  oxyCODONE (OXYCONTIN) 80 mg 12 hr tablet Take 1 tablet by mouth every 8 (eight) hours.    [provider]  oxycodone (ROXICODONE) 30 MG immediate release tablet Take 30 mg by mouth every 3 (three) hours as needed. 04/14/20   [provider]  ranolazine (RANEXA) 500 MG 12 hr tablet Take 1 tablet (500 mg total) by mouth 2 (two) times daily. 04/05/20 05/05/20  Deatra James, MD  SSD 1 % cream SMARTSIG:1 Topical 4-5 Times Daily 01/25/20   [provider]  sucralfate (CARAFATE) 1 g tablet Take 1 g by mouth 4 (four) times daily. 03/01/20   [provider]  ticagrelor (BRILINTA) 90 MG TABS tablet Take 1 tablet (90 mg total) by mouth 2 (two) times daily. *NEEDS OFFICE VISIT FOR FURTHER REFILLS-PLEASE CALL [phone] TO SCHEDULE.* 05/14/19   Theora Gianotti, NP    Allergies Bee venom, Metformin and related, Darvon [propoxyphene], Gabapentin, Nsaids, Tramadol, and Contrast media [iodinated diagnostic agents]  Family History  Problem Relation Age of Onset  . Non-Hodgkin's lymphoma Mother   .  Osteoporosis Mother   . Lupus Sister   . Heart disease Brother   . Heart attack Brother   . Heart attack Father     Social History Social History   Tobacco Use  . Smoking status: Former Smoker    Packs/day: 0.50    Years: 30.00    Pack years: 15.00    Types: Cigarettes    Quit date: 12/19/2017    Years since quitting: 2.8  . Smokeless tobacco: Former Systems developer  . Tobacco comment: quit 12/19/2017.  Vaping Use  . Vaping Use: Never used  Substance Use Topics  . Alcohol use: No  . Drug  use: No    Review of Systems Level 5 caveat:  history/ROS limited by acute/critical illness   constitutional: No fever/chills Eyes: No visual changes. ENT: No sore throat.  She does have a sensation of throat tightening. Cardiovascular: Acute onset severe chest pain that woke her from sleep. Respiratory: Some shortness of breath associated with the chest pain. Gastrointestinal: No abdominal pain.  No nausea, no vomiting.  No diarrhea.  No constipation. Genitourinary: Negative for dysuria. Musculoskeletal: Pain and decreased sensation and ability to move her left upper extremity.  No other acute musculoskeletal issues. Integumentary: Negative for rash. Neurological: Decreased sensation to touch and localized weakness of the patient's left arm.  No other focal numbness nor weakness of which she is aware.   ____________________________________________   PHYSICAL EXAM:  VITAL SIGNS: ED Triage Vitals  Enc Vitals Group     BP 11/06/20 0620 (!) 147/90     Pulse Rate 11/06/20 0620 84     Resp 11/06/20 0620 18     Temp 11/06/20 0620 98.4 F (36.9 C)     Temp Source 11/06/20 0620 Oral     SpO2 11/06/20 0620 100 %     Weight 11/06/20 0621 58.5 kg (129 lb)     Height 11/06/20 0621 1.549 m (5\' 1" )     Head Circumference --      Peak Flow --      Pain Score 11/06/20 0621 10     Pain Loc --      Pain Edu? --      Excl. in [place]? --     Constitutional: Alert and oriented.  Moderate to severe distress  due to pain. Eyes: Conjunctivae are normal.  Head: Atraumatic. Nose: No congestion/rhinnorhea. Mouth/Throat: Patient is wearing a mask. Neck: No stridor.  No meningeal signs.   Cardiovascular: Normal rate, regular rhythm.   Palpable radial pulses in bilateral upper extremities, but the left upper extremity does feel cooler to the touch than the right.  Capillary refill seems slightly decreased in bilateral upper extremities. Respiratory: Normal respiratory effort.  No retractions. Gastrointestinal: Soft and nondistended, minimal tenderness to palpation throughout the abdomen without any localized peritonitis. Musculoskeletal: Patient is unable to move her left upper extremity to any degree of success other than to lightly grip fingers.  She says she is unable to lift it and has to move her left arm with her right arm.  She has no chronic musculoskeletal issues at baseline that would prohibit its use. Neurologic:  Normal speech and language.  Pain and decreased motor function and decreased sensation of the left upper extremity.  No obvious cranial nerve deficits. Skin:  Skin is warm, dry and intact. Psychiatric: Mood and affect are appropriately anxious and concerned.  ____________________________________________   LABS (all labs ordered are listed, but only abnormal results are displayed)  Labs Reviewed  COMPREHENSIVE METABOLIC PANEL - Abnormal; Notable for the following components:      Result Value   Glucose, Bld 281 (*)    AST 13 (*)    All other components within normal limits  RESP PANEL BY RT-PCR (FLU A&B, COVID) ARPGX2  MAGNESIUM  CBC WITH DIFFERENTIAL/PLATELET  PROTIME-INR  APTT  TROPONIN I (HIGH SENSITIVITY)  TROPONIN I (HIGH SENSITIVITY)   ____________________________________________  EKG  ED ECG REPORT I, Hinda Kehr, the attending physician, personally viewed and interpreted this ECG.  Date: 11/06/2020 EKG Time: 6:20 AM Rate: 86 Rhythm: normal sinus rhythm QRS  Axis: normal Intervals: normal ST/T Wave abnormalities:  Non-specific ST segment / T-wave changes, but no clear evidence of acute ischemia. Narrative Interpretation: no definitive evidence of acute ischemia; does not meet STEMI criteria.   ____________________________________________  RADIOLOGY I, Hinda Kehr, personally viewed and evaluated these images (plain radiographs) as part of my medical decision making, as well as reviewing the written report by the radiologist.  I also discussed the results by phone with the radiologist.  ED MD interpretation: No acute abnormalities on CT head.  CT angio has extensive chronic issues but nothing to explain her current symptoms.  Official radiology report(s): CT Head Wo Contrast  Result Date: 11/06/2020 CLINICAL DATA:  Left arm weakness EXAM: CT HEAD WITHOUT CONTRAST TECHNIQUE: Contiguous axial images were obtained from the base of the skull through the vertex without intravenous contrast. COMPARISON:  None. FINDINGS: Brain: Tiny remote infarct within the right cerebellar hemisphere. Punctate lacunar infarct versus dilated perivascular space within the inferior right basal ganglia. No abnormal intra or extra-axial mass lesion or fluid collection. No abnormal mass effect or midline shift. No evidence of acute intracranial hemorrhage or infarct. Ventricular size is normal. Cerebellum unremarkable. Vascular: Unremarkable Skull: Intact Sinuses/Orbits: Mild mucosal thickening noted within the maxillary sinuses bilaterally as well as within several ethmoid air cells bilaterally. No air-fluid levels. Remaining paranasal sinuses are clear. Orbits are unremarkable. Other: Mastoid air cells and middle ear cavities are clear. IMPRESSION: No evidence of acute intracranial hemorrhage or infarct. Tiny remote right cerebellar and possible right basal ganglia infarcts. Electronically Signed   By: Fidela Salisbury MD   On: 11/06/2020 07:09   CT Angio Chest/Abd/Pel for  Dissection W and/or W/WO  Result Date: 11/06/2020 CLINICAL DATA:  Chest pain, left arm weakness EXAM: CT ANGIOGRAPHY CHEST, ABDOMEN AND PELVIS TECHNIQUE: Non-contrast CT of the chest was initially obtained. Multidetector CT imaging through the chest, abdomen and pelvis was performed using the standard protocol during bolus administration of intravenous contrast. Multiplanar reconstructed images and MIPs were obtained and reviewed to evaluate the vascular anatomy. CONTRAST:  47mL OMNIPAQUE IOHEXOL 350 MG/ML SOLN COMPARISON:  03/30/2020 FINDINGS: CTA CHEST FINDINGS Cardiovascular: The thoracic aorta is normal in caliber. No evidence of intramural hematoma, dissection, or aneurysm. There is moderate largely atheromatous plaque predominantly within the descending thoracic aorta demonstrating a corrugated morphology. The arch vasculature demonstrates normal anatomic configuration and is widely patent proximally. Mild coronary artery calcification. Long segment left anterior descending coronary artery stenting has been performed. Global cardiac size is within normal limits. No pericardial effusion. The central pulmonary arteries are of normal caliber. Mediastinum/Nodes: The visualized thyroid is unremarkable. No pathologic thoracic adenopathy. The esophagus is unremarkable. Lungs/Pleura: Moderate centrilobular emphysema. No focal pulmonary nodules or infiltrates. Previously noted pulmonary infiltrates have resolved, in keeping with acute infection or inflammation on the prior examination. No pneumothorax or pleural effusion. Central airways are widely patent. There is mild central bronchial wall thickening again noted in keeping with chronic airway inflammation. Musculoskeletal: No acute bone abnormality. No lytic or blastic bone lesions identified. Review of the MIP images confirms the above findings. CTA ABDOMEN AND PELVIS FINDINGS VASCULAR Aorta: Normal caliber. No evidence of aneurysm or dissection. Extensive  atheromatous plaque identified with moderate fissuring resulting in a corrugated pattern. No periaortic inflammatory change identified. Celiac: Less than 50% stenosis of the origin. Normal anatomic configuration. Distally widely patent. No evidence of aneurysm or dissection. SMA: Less than 50% stenosis at its origin. Distally widely patent. No aneurysm or dissection. Renals: Single renal arteries bilaterally. 50% stenosis of the right  renal artery at its origin secondary to atherosclerotic plaque. Less than 50% stenosis of the left renal artery at its origin. Normal vascular morphology. No aneurysm. IMA: 50% stenosis at the origin.  Distally widely patent. Inflow: Moderate mixed atherosclerotic plaque within the right common iliac artery without evidence of hemodynamically significant stenosis. Left common iliac artery is widely patent. No evidence of aneurysm or dissection. Internal iliac arteries are patent at their origins bilaterally. Veins: No obvious venous abnormality within the limitations of this arterial phase study. Review of the MIP images confirms the above findings. NON-VASCULAR Hepatobiliary: The liver is unremarkable. The gallbladder is unremarkable. Similar to that noted on prior CT examination of 10/10/2020, the extrahepatic bile duct is dilated measuring up to 1 cm in greatest dimension, new since remote prior examination of 03/30/2020. As noted previously, a distal obstructing lesion is not clearly identified on this examination. Pancreas: Unremarkable Spleen: Unremarkable Adrenals/Urinary Tract: Adrenal glands are unremarkable. Kidneys are normal, without renal calculi, focal lesion, or hydronephrosis. Bladder is unremarkable. Stomach/Bowel: There is moderate stool throughout the colon. The stomach, small bowel, and large bowel are otherwise unremarkable. Appendix normal. No free intraperitoneal gas or fluid. Lymphatic: No pathologic adenopathy within the abdomen and pelvis. Reproductive: There  is extensive soft tissue thickening at the introitus and involving the medial aspects of the vulva bilaterally, similar to prior examination. Inflammatory changes extend posteriorly to involve the anus. There is asymmetric thickening of the right wall of the rectum, best appreciated on axial image # 195/5, more prominent than that seen on prior examination. Other: Tiny fat containing umbilical hernia noted. Musculoskeletal: No acute bone abnormality. No lytic or blastic bone lesions. Review of the MIP images confirms the above findings. IMPRESSION: No evidence of a thoracoabdominal aortic aneurysm or dissection. Moderate largely atheromatous plaque demonstrating extensive fissuring within the a descending thoracic and abdominal aorta. Clinical correlation for evidence of embolization (blue toe syndrome, livedo reticularis) may be helpful for further management. 50% stenosis of the right renal artery at its origin. 50% stenosis of the inferior mesenteric artery at its origin. The wide patency of the celiac axis and superior mesenteric artery however, would not support the diagnosis of chronic mesenteric ischemia. Mild coronary artery calcification with long segment stenting of the left anterior descending coronary artery. Moderate centrilobular emphysema. Persistent dilation of the extrahepatic bile duct without obvious cause identified. Correlation with liver enzymes would be helpful to assess for an obstructing process. If indicated, ERCP or MRCP examination may be more helpful for further evaluation. Moderate stool throughout the colon. Asymmetric thickening of the a distal rectum may result in some degree of obstruction and resultant fecal retention. Soft tissue thickening appears confluent with that seen within the introitus and perineum noted on multiple prior examinations. Aortic Atherosclerosis (ICD10-I70.0) and Emphysema (ICD10-J43.9). Electronically Signed   By: Helyn Numbers MD   On: 11/06/2020 07:38     ____________________________________________   PROCEDURES   Procedure(s) performed (including Critical Care):  .Critical Care Performed by: Loleta Rose, MD Authorized by: Loleta Rose, MD   Critical care provider statement:    Critical care time (minutes):  45   Critical care time was exclusive of:  Separately billable procedures and treating other patients   Critical care was necessary to treat or prevent imminent or life-threatening deterioration of the following conditions:  Cardiac failure, circulatory failure and CNS failure or compromise   Critical care was time spent personally by me on the following activities:  Development of treatment plan  with patient or surrogate, discussions with consultants, evaluation of patient's response to treatment, examination of patient, obtaining history from patient or surrogate, ordering and performing treatments and interventions, ordering and review of laboratory studies, ordering and review of radiographic studies, pulse oximetry, re-evaluation of patient's condition and review of old charts .1-3 Lead EKG Interpretation Performed by: Hinda Kehr, MD Authorized by: Hinda Kehr, MD     Interpretation: normal     ECG rate:  88   ECG rate assessment: normal     Rhythm: sinus rhythm     Ectopy: none     Conduction: normal       ____________________________________________   INITIAL IMPRESSION / MDM / ASSESSMENT AND PLAN / ED COURSE  As part of my medical decision making, I reviewed the following data within the [place] notes reviewed and incorporated, Labs reviewed , EKG interpreted , Old chart reviewed, Patient signed out to Dr. Corky Downs, Discussed with radiologist and Notes from prior ED visits   Differential diagnosis includes, but is not limited to, aortic dissection, ACS, CVA, intracranial hemorrhage, electrolyte or metabolic abnormality.  I have very concerned about thoracic aortic dissection.   The patient has acute onset of severe chest pain with associated neurological deficits affecting her left upper extremity.  Given the amount of pain she is experiencing both in her chest and her left arm, I think this is much more likely than CVA.  In spite of the national shortage of IV contrast material, I believe that she qualifies.  Additionally, she has a listed allergy of a mild rash to IV contrast dye but the record indicates that she has done fine in the past with steroids and Benadryl.  I do not believe that it would be safe to wait 4 hours after the administration of the steroids.  I called Randall Hiss the CT technologist and I explained the situation and that I am authorizing proceeding with the CTA without waiting for labs and without waiting the 4 hours indicated by the IV contrast allergy protocol.  I ordered Solu-Cortef 200 mg IV and we have provided Benadryl 50 mg p.o. (I am reluctant to provide 50 mg IV; even though she is not elderly, she has numerous chronic medical conditions and I am concerned that 50 mg IV will cause altered mental status and an inability to assess whether her altered status is due to her medication or worsening acute illness).  I am giving her morphine 4 mg IV and Zofran 4 mg IV in addition to the medications above.  I verified in the medical record that her last labs had normal kidney function and I feel that the risk of waiting outweighs the benefit of waiting for her lab results before proceeding with IV contrast.  I also ordered a head CT without contrast to further evaluate the possibility of intracranial issue such as CVA or intracranial hemorrhage prior to the administration of the IV contrast.  Lab work is pending including coagulation studies and type and screen in case she does have a dissection.  Blood pressure is stable currently.  There was no substantial difference in blood pressures between the left and the right arm.  The patient is on the cardiac monitor to  evaluate for evidence of arrhythmia and/or significant heart rate changes.     Clinical Course as of 11/06/20 0829  Thu Nov 06, 2020  0721 CBC with Differential/Platelet Normal CBC.  Also normal coagulation studies [CF]  0743 Troponin I (High  Sensitivity): 12 [CF]  6606 I personally reviewed the CTA imaging and did not see evidence of dissection but there does appear to be extensive atherosclerotic plaques.  I then reviewed the radiology report and then called and spoke by phone with the radiologist, Dr. Christa See.  We went over the results in detail.  He confirmed that there is extensive chronic atherosclerotic plaques in the aorta but it is mostly in the descending thoracic aorta and abdominal aorta.  There does not appear to be any lesion or abnormality above the subclavian artery that could account for the left upper extremity neurological deficits.  He confirmed that what ever is causing the patient's issues does not appear to be within the chest.  I reassessed the patient.  She is still having pain but it has improved a little bit after morphine.  However she still has limited motor function of her left arm, with pain and some decreased sensation.  Given her history of cervical spine issues, I believe that the next step is to obtain MR brain and MR cervical spine to rule out CVA and acute cervical spine impingement that could lead to substantial neurological deficits like she is exhibiting.  I discussed this with the patient and she understands and agrees with the plan. [CF]  [id number] Transferring ED care to Dr. Corky Downs to follow up imaging and reassess patient. [CF]  0828 SARS Coronavirus 2 by RT PCR: NEGATIVE [CF]    Clinical Course User Index [CF] Hinda Kehr, MD     ____________________________________________  FINAL CLINICAL IMPRESSION(S) / ED DIAGNOSES  Final diagnoses:  Chest pain, unspecified type  Left arm pain  Left arm weakness  Vulvar cancer ([place])  Acute focal neurological  deficit     MEDICATIONS GIVEN DURING THIS VISIT:  Medications  midazolam (VERSED) injection 1 mg (has no administration in time range)  hydrocortisone sodium succinate (SOLU-CORTEF) injection 200 mg (200 mg Intravenous Given 11/06/20 0636)  ondansetron (ZOFRAN) injection 4 mg (4 mg Intravenous Given 11/06/20 0634)  morphine 4 MG/ML injection 4 mg (4 mg Intravenous Given 11/06/20 0634)  diphenhydrAMINE (BENADRYL) capsule 50 mg (50 mg Oral Given 11/06/20 0636)  iohexol (OMNIPAQUE) 350 MG/ML injection 75 mL (75 mLs Intravenous Contrast Given 11/06/20 0644)  morphine 4 MG/ML injection 4 mg (4 mg Intravenous Given 11/06/20 3016)     ED Discharge Orders    None      *Please note:  Mily Malecki was evaluated in Emergency Department on 11/06/2020 for the symptoms described in the history of present illness. She was evaluated in the context of the global COVID-19 pandemic, which necessitated consideration that the patient might be at risk for infection with the SARS-CoV-2 virus that causes COVID-19. Institutional protocols and algorithms that pertain to the evaluation of patients at risk for COVID-19 are in a state of rapid change based on information released by regulatory bodies including the CDC and federal and state organizations. These policies and algorithms were followed during the patient's care in the ED.  Some ED evaluations and interventions may be delayed as a result of limited staffing during and after the pandemic.*  Note:  This document was prepared using Dragon voice recognition software and may include unintentional dictation errors.   Hinda Kehr, MD 11/06/20 [id number]

## 2020-11-06 NOTE — ED Provider Notes (Signed)
Patient evaluated by Dr. Cari Caraway of neurosurgery, greatly appreciate his consultation.  Plan will be OR around 5:00, medicine to admit   Renee Drafts, MD 11/06/20 1336

## 2020-11-06 NOTE — Progress Notes (Signed)
    Attending Progress Note  History: Renee Mack is here for progressive weakness and cervical myeloradiculopathy.  11/06/20 - ACDF C5-7  POD0: Doing well in PACU.  Feels left arm more than prior to surgery.  Physical Exam: Vitals:   11/06/20 1500 11/06/20 1702  BP: (!) 143/75 138/86  Pulse: (!) 109 84  Resp: (!) 21 18  Temp: 98.4 F (36.9 C) 98.4 F (36.9 C)  SpO2: 98% 95%    AA Ox3 CNI  Strength:5/5 throughout RUE LUE with 4-/5 biceps, deltoids, and triceps. L wrist drop 2/5, finger drop 2/5   Data:  Recent Labs  Lab 11/06/20 0629  NA 137  K 4.1  CL 102  CO2 25  BUN 9  CREATININE 0.60  GLUCOSE 281*  CALCIUM 9.4   Recent Labs  Lab 11/06/20 0629  AST 13*  ALT 12  ALKPHOS 98     Recent Labs  Lab 11/06/20 0629  WBC 6.7  HGB 14.3  HCT 42.4  PLT 298   Recent Labs  Lab 11/06/20 0629  APTT 27  INR 1.0         Other tests/results: xrays pending  Assessment/Plan:  Renee Mack is stable from ACDF.  - mobilize - pain control - DVT prophylaxis - PTOT - check xrays tomorrow   Meade Maw MD, [facility] Department of Neurosurgery

## 2020-11-06 NOTE — ED Provider Notes (Signed)
Imaging is overall reassuring, question nerve impingement C5-C6 region, have consulted Dr. Cari Caraway of neurosurgery to evaluate.   Lavonia Drafts, MD 11/06/20 1141

## 2020-11-06 NOTE — H&P (Signed)
History and Physical    Renee Mack LKT:[id number] DOB: 1965-08-31 DOA: 11/06/2020  Referring MD/NP/PA:   PCP: Olin Hauser, DO   Patient coming from:  The patient is coming from home.  At baseline, pt is independent for most of ADL.        Chief Complaint: chest pain and left arm weakness  HPI: Renee Mack is a 55 y.o. female with medical history significant of HLD, DM, CAD with DES, sCHF with EF 45-50%, vulvar CA (s/p of surgery and radiation therapy), COPD, GERD, depression with anxiety, former smoker, pancreatitis, DKA, who presents with chest pain and left arm weakness.  Pt states that her chest pain started at about 4:00 AM today. Her chest pain is located in the upper chest close to the neck, 8 out of 10 severity, sharp, radiating to the left arm.  Patient has some mild shortness of breath.  Patient has mild cough, no fever or chills. Patient states that she coughed up a little bright red blood, just one episode.  Patient also reports left arm weakness and left toe numbness.  He has some left upper arm pain.  No leg weakness.  No facial droop or slurred speech.  Patient has nausea, denies vomiting, diarrhea or abdominal pain.  No hematemesis.  No rectal bleeding.  Patient is on Brilinta currently.  ED Course: pt was found to have WBC 6.1, INR 1.0, PTT 27, troponin level 12, 11, negative COVID-19 PCR, electrolytes renal function okay, temperature normal, blood pressure 131/69, heart rate 83, RR 19, oxygen saturation 96% on room air.  MRI of brain is negative for stroke. MRA negative for LVO. CTA negative for aortic dissection. Pt is admitted to progressive bed as inpatient.  Dr. Izora Ribas of neurosurgeon is consulted.  MRI-C spin: 1. Degenerative changes of the cervical spine, more pronounced at C6-7 where there is moderate to severe spinal canal stenosis with mass effect on the cord, progressed from prior MRI. No cord signal abnormality. 2. Mild spinal canal stenosis at  C5-6, mildly progressed from prior MRI. 3. Multilevel neural foraminal narrowing with moderate right and severe left neural foraminal narrowing at C5-6 and C6-7.   CTA of chest/abd/pelvis:  No evidence of a thoracoabdominal aortic aneurysm or dissection. Moderate largely atheromatous plaque demonstrating extensive fissuring within the a descending thoracic and abdominal aorta. Clinical correlation for evidence of embolization (blue toe syndrome, livedo reticularis) may be helpful for further management.  50% stenosis of the right renal artery at its origin.  50% stenosis of the inferior mesenteric artery at its origin. The wide patency of the celiac axis and superior mesenteric artery however, would not support the diagnosis of chronic mesenteric ischemia.  Mild coronary artery calcification with long segment stenting of the left anterior descending coronary artery.  Moderate centrilobular emphysema.  Persistent dilation of the extrahepatic bile duct without obvious cause identified. Correlation with liver enzymes would be helpful to assess for an obstructing process. If indicated, ERCP or MRCP examination may be more helpful for further evaluation.  Moderate stool throughout the colon. Asymmetric thickening of the a distal rectum may result in some degree of obstruction and resultant fecal retention. Soft tissue thickening appears confluent with that seen within the introitus and perineum noted on multiple prior examinations.  Aortic Atherosclerosis (ICD10-I70.0) and Emphysema (ICD10-J43.9).   Review of Systems:   General: no fevers, chills, no body weight gain, has poor appetite, has fatigue HEENT: no blurry vision, hearing changes or sore throat Respiratory: has  dyspnea, coughing, hemoptysis, no wheezing CV: has chest pain, no palpitations GI: has nausea, no vomiting, abdominal pain, diarrhea, constipation GU: no dysuria, burning on urination, increased urinary  frequency, hematuria  Ext: no leg edema Neuro: no vision change or hearing loss. Has left arm weakness and left toe numbness Skin: no rash, no skin tear. MSK: No muscle spasm, no deformity, no limitation of range of movement in spin Heme: No easy bruising.  Travel history: No recent long distant travel.  Allergy:  Allergies  Allergen Reactions  . Bee Venom Itching, Shortness Of Breath and Swelling  . Metformin And Related Shortness Of Breath and Swelling  . Darvon [Propoxyphene] Itching  . Gabapentin Swelling  . Nsaids Other (See Comments)    Ulcers   . Tramadol Hives  . Contrast Media [Iodinated Diagnostic Agents] Rash    If you benadryl, and steroids she is able to take the contrast per pt    Past Medical History:  Diagnosis Date  . Cancer ([place])    vulvular  . Cervical disc disease    a. 01/2018 MRI Cervical spine: Cervical spondylosis with multilevel disc and facet degeneration greatest at C5-6 and C6-7.  Moderate to sev R C5-6, mod L C5-6, and mod bilat C6-7 foraminal stenosis with multilevel mild foraminal stenosis.  Mild C5-6 and mod C6-7 canal stenosis. C6-7 central cord impingement w/ cord flattening.  . Chronic combined systolic (congestive) and diastolic (congestive) heart failure ([place])    a. 12/2017 Echo: EF 30-35%, mid-apicalanteroseptal, ant, and apical AK. Gr1 DD. Mild conc LVH; b. 03/2018 Echo: EF 45-50%, antsept, ant HK. Mild MR. Nl LA size. Nl RV fxn.  Marland Kitchen COPD (chronic obstructive pulmonary disease) ([facility])    not on home oxygen  . Coronary artery disease    a. 12/2017 ACS/PCI: LAD 100p (2.25x26 Resolute Onyx DES), 40m (2.0x12 Resolute Onyx DES), 80d (2.0x15 Resolute Onyx DES), RCA 90p (non-dominant). EF 25-35%. Post-MI course complicated by CGS; b. [id number] NSTEMI/subacute thrombosis-->LAD 100 (PTCA + DES x 1); c. 01/2018 NSTEMI/PCI: LM min irregs, LAD 50-60p ISR/hazy (3.5x12 Resolute DES), 35m/d (underexpansion of prior stents-->PTCA). EF 45-50%.  Marland Kitchen GERD (gastroesophageal  reflux disease)   . H/O degenerative disc disease   . Hypotension   . Ischemic cardiomyopathy    a. 12/2017 Echo: EF 30-35%; b. 03/2018 Echo: EF 45-50%.  . Myocardial infarction ([place])    a. 12/2017-->DES to LAD x 3.  . Pancreatitis   . Shingles   . Spinal stenosis   . Tobacco abuse   . Ulcer (traumatic) of oral mucosa   . Urinary incontinence   . VIN III (vulvar intraepithelial neoplasia III)     Past Surgical History:  Procedure Laterality Date  . ABDOMINAL HYSTERECTOMY     partial  . CORONARY BALLOON ANGIOPLASTY N/A 05/22/2018   Procedure: CORONARY BALLOON ANGIOPLASTY;  Surgeon: Wellington Hampshire, MD;  Location: [facility];  Service: Cardiovascular;  Laterality: N/A;  . CORONARY STENT INTERVENTION N/A 12/26/2017   Procedure: CORONARY STENT INTERVENTION;  Surgeon: Wellington Hampshire, MD;  Location: [facility];  Service: Cardiovascular;  Laterality: N/A;  . CORONARY STENT INTERVENTION N/A 02/08/2018   Procedure: CORONARY STENT INTERVENTION;  Surgeon: Nelva Bush, MD;  Location: [place] [facility];  Service: Cardiovascular;  Laterality: N/A;  . CORONARY/GRAFT ACUTE MI REVASCULARIZATION N/A 12/19/2017   Procedure: Coronary/Graft Acute MI Revascularization;  Surgeon: Wellington Hampshire, MD;  Location: [facility];  Service: Cardiovascular;  Laterality: N/A;  .  ECTOPIC PREGNANCY SURGERY Left   . INTRAVASCULAR ULTRASOUND/IVUS N/A 02/08/2018   Procedure: Intravascular Ultrasound/IVUS;  Surgeon: Nelva Bush, MD;  Location: [facility];  Service: Cardiovascular;  Laterality: N/A;  . LEFT HEART CATH AND CORONARY ANGIOGRAPHY N/A 12/19/2017   Procedure: LEFT HEART CATH AND CORONARY ANGIOGRAPHY;  Surgeon: Wellington Hampshire, MD;  Location: [facility];  Service: Cardiovascular;  Laterality: N/A;  . LEFT HEART CATH AND CORONARY ANGIOGRAPHY N/A 12/26/2017   Procedure: LEFT HEART CATH AND CORONARY ANGIOGRAPHY;  Surgeon: Wellington Hampshire, MD;  Location:  [facility];  Service: Cardiovascular;  Laterality: N/A;  . LEFT HEART CATH AND CORONARY ANGIOGRAPHY N/A 02/08/2018   Procedure: LEFT HEART CATH AND CORONARY ANGIOGRAPHY;  Surgeon: Nelva Bush, MD;  Location: [facility];  Service: Cardiovascular;  Laterality: N/A;  . LEFT HEART CATH AND CORONARY ANGIOGRAPHY N/A 05/22/2018   Procedure: LEFT HEART CATH AND CORONARY ANGIOGRAPHY;  Surgeon: Wellington Hampshire, MD;  Location: [facility];  Service: Cardiovascular;  Laterality: N/A;  . LEFT HEART CATH AND CORONARY ANGIOGRAPHY N/A 06/15/2018   Procedure: LEFT HEART CATH AND CORONARY ANGIOGRAPHY;  Surgeon: Wellington Hampshire, MD;  Location: [facility];  Service: Cardiovascular;  Laterality: N/A;  . LYMPHADENECTOMY    . SPLENECTOMY, PARTIAL    . vulvulasectomy      Social History:  reports that she quit smoking about 2 years ago. Her smoking use included cigarettes. She has a 15.00 pack-year smoking history. She has quit using smokeless tobacco. She reports that she does not drink alcohol and does not use drugs.  Family History:  Family History  Problem Relation Age of Onset  . Non-Hodgkin's lymphoma Mother   . Osteoporosis Mother   . Lupus Sister   . Heart disease Brother   . Heart attack Brother   . Heart attack Father      Prior to Admission medications   Medication Sig Start Date End Date Taking? Authorizing Provider  albuterol (PROAIR HFA) 108 (90 Base) MCG/ACT inhaler Inhale 1 puff into the lungs every 6 (six) hours as needed for wheezing or shortness of breath.     [provider]  amitriptyline (ELAVIL) 25 MG tablet Take 1 tablet (25 mg total) by mouth at bedtime. Patient taking differently: Take 75 mg by mouth at bedtime. 04/05/20 05/05/20  ShahmehdiValeria Batman, MD  aspirin 81 MG tablet Take 81 mg by mouth daily.    [provider]  atorvastatin (LIPITOR) 80 MG tablet Take 1 tablet (80 mg total) by mouth daily at 6 PM. 03/05/19  03/30/20  Theora Gianotti, NP  Blood Glucose Monitoring Suppl (GLUCOCOM BLOOD GLUCOSE MONITOR) DEVI 1 m. 11/15/17   Karamalegos, Devonne Doughty, DO  clonazePAM (KLONOPIN) 1 MG tablet Take 1 mg by mouth 3 (three) times daily as needed. 03/19/20   [provider]  esomeprazole (NEXIUM) 40 MG capsule Take 1 capsule (40 mg total) by mouth daily before breakfast. 09/22/20   Parks Ranger, Devonne Doughty, DO  famotidine (PEPCID) 20 MG tablet Take 1 tablet (20 mg total) by mouth 2 (two) times daily. 09/22/20   Karamalegos, Devonne Doughty, DO  insulin isophane & regular human (NOVOLIN 70/30 FLEXPEN) (70-30) 100 UNIT/ML KwikPen Inject 18 Units into the skin 2 (two) times daily. 04/05/20   Deatra James, MD  Lancet Devices (SIMPLE DIAGNOSTICS LANCING DEV) MISC AS DIRECTED 11/15/17   Parks Ranger Devonne Doughty, DO  Lidocaine HCl 4 % GEL Apply  topically. 02/05/20   [provider]  methocarbamol (ROBAXIN) 500 MG tablet Take 1 tablet by mouth 4 (four) times daily as needed. 08/01/20   [provider]  mirtazapine (REMERON) 15 MG tablet Take 15 mg by mouth at bedtime. 07/07/18   [provider]  nitroGLYCERIN (NITROSTAT) 0.4 MG SL tablet Place 1 tablet (0.4 mg total) under the tongue every 5 (five) minutes as needed for chest pain. 06/16/18   Gouru, Illene Silver, MD  ondansetron (ZOFRAN-ODT) 4 MG disintegrating tablet Take 4 mg by mouth every 8 (eight) hours as needed. 11/27/19   [provider]  oxyCODONE (OXYCONTIN) 80 mg 12 hr tablet Take 1 tablet by mouth every 8 (eight) hours.    [provider]  oxycodone (ROXICODONE) 30 MG immediate release tablet Take 30 mg by mouth every 3 (three) hours as needed. 04/14/20   [provider]  ranolazine (RANEXA) 500 MG 12 hr tablet Take 1 tablet (500 mg total) by mouth 2 (two) times daily. 04/05/20 05/05/20  Deatra James, MD  SSD 1 % cream SMARTSIG:1 Topical 4-5 Times Daily 01/25/20   [provider]  sucralfate  (CARAFATE) 1 g tablet Take 1 g by mouth 4 (four) times daily. 03/01/20   [provider]  ticagrelor (BRILINTA) 90 MG TABS tablet Take 1 tablet (90 mg total) by mouth 2 (two) times daily. *NEEDS OFFICE VISIT FOR FURTHER REFILLS-PLEASE CALL [phone] TO SCHEDULE.* 05/14/19   Theora Gianotti, NP    Physical Exam: Vitals:   11/06/20 1400 11/06/20 1430 11/06/20 1500 11/06/20 1702  BP: 102/68 131/72 (!) 143/75 138/86  Pulse: 86 85 (!) 109 84  Resp: 19 14 (!) 21 18  Temp:   98.4 F (36.9 C) 98.4 F (36.9 C)  TempSrc:    Oral  SpO2: 96% 95% 98% 95%  Weight:    59 kg  Height:    5\' 1"  (1.549 m)   General: Not in acute distress HEENT:       Eyes: PERRL, EOMI, no scleral icterus.       ENT: No discharge from the ears and nose, no pharynx injection, no tonsillar enlargement.        Neck: No JVD, no bruit, no mass felt. Heme: No neck lymph node enlargement. Cardiac: S1/S2, RRR, No murmurs, No gallops or rubs. Respiratory: No rales, wheezing, rhonchi or rubs. GI: Soft, nondistended, nontender, no rebound pain, no organomegaly, BS present. GU: No hematuria Ext: No pitting leg edema bilaterally. 1+DP/PT pulse bilaterally. Musculoskeletal: No joint deformities, No joint redness or warmth, no limitation of ROM in spin. Skin: No rashes.  Neuro: Alert, oriented X3, cranial nerves II-XII grossly intact, moves all extremities normally. Muscle strength 1/5 in left arm and 5/5 in other extremities, sensation to light touch intact. Psych: Patient is not psychotic, no suicidal or hemocidal ideation.  Labs on Admission: I have personally reviewed following labs and imaging studies  CBC: Recent Labs  Lab 11/06/20 0629  WBC 6.7  NEUTROABS 4.0  HGB 14.3  HCT 42.4  MCV 91.4  PLT [id number]   Basic Metabolic Panel: Recent Labs  Lab 11/06/20 0629  NA 137  K 4.1  CL 102  CO2 25  GLUCOSE 281*  BUN 9  CREATININE 0.60  CALCIUM 9.4  MG 1.7   GFR: Estimated Creatinine Clearance:  64.8 mL/min (by C-G formula based on SCr of 0.6 mg/dL). Liver Function Tests: Recent Labs  Lab 11/06/20 0629  AST 13*  ALT 12  ALKPHOS  98  BILITOT 0.5  PROT 7.5  ALBUMIN 3.5   No results for input(s): LIPASE, AMYLASE in the last 168 hours. No results for input(s): AMMONIA in the last 168 hours. Coagulation Profile: Recent Labs  Lab 11/06/20 0629  INR 1.0   Cardiac Enzymes: No results for input(s): CKTOTAL, CKMB, CKMBINDEX, TROPONINI in the last 168 hours. BNP (last 3 results) No results for input(s): PROBNP in the last 8760 hours. HbA1C: No results for input(s): HGBA1C in the last 72 hours. CBG: Recent Labs  Lab 11/06/20 1643  GLUCAP 308*   Lipid Profile: No results for input(s): CHOL, HDL, LDLCALC, TRIG, CHOLHDL, LDLDIRECT in the last 72 hours. Thyroid Function Tests: No results for input(s): TSH, T4TOTAL, FREET4, T3FREE, THYROIDAB in the last 72 hours. Anemia Panel: No results for input(s): VITAMINB12, FOLATE, FERRITIN, TIBC, IRON, RETICCTPCT in the last 72 hours. Urine analysis:    Component Value Date/Time   COLORURINE STRAW (A) 10/13/2020 1421   APPEARANCEUR CLEAR (A) 10/13/2020 1421   APPEARANCEUR Hazy 04/19/2014 0925   LABSPEC 1.001 (L) 10/13/2020 1421   LABSPEC 1.006 04/19/2014 0925   PHURINE 6.0 10/13/2020 1421   GLUCOSEU >=500 (A) 10/13/2020 1421   GLUCOSEU >=500 04/19/2014 0925   HGBUR NEGATIVE 10/13/2020 1421   BILIRUBINUR NEGATIVE 10/13/2020 1421   BILIRUBINUR Negative 04/19/2014 0925   KETONESUR NEGATIVE 10/13/2020 1421   PROTEINUR NEGATIVE 10/13/2020 1421   NITRITE NEGATIVE 10/13/2020 1421   LEUKOCYTESUR NEGATIVE 10/13/2020 1421   LEUKOCYTESUR 3+ 04/19/2014 0925   Sepsis Labs: @LABRCNTIP (procalcitonin:4,lacticidven:4) ) Recent Results (from the past 240 hour(s))  Resp Panel by RT-PCR (Flu A&B, Covid) Nasopharyngeal Swab     Status: None   Collection Time: 11/06/20  7:17 AM   Specimen: Nasopharyngeal Swab; Nasopharyngeal(NP) swabs in vial  transport medium  Result Value Ref Range Status   SARS Coronavirus 2 by RT PCR NEGATIVE NEGATIVE Final    Comment: (NOTE) SARS-CoV-2 target nucleic acids are NOT DETECTED.  The SARS-CoV-2 RNA is generally detectable in upper respiratory specimens during the acute phase of infection. The lowest concentration of SARS-CoV-2 viral copies this assay can detect is 138 copies/mL. A negative result does not preclude SARS-Cov-2 infection and should not be used as the sole basis for treatment or other patient management decisions. A negative result may occur with  improper specimen collection/handling, submission of specimen other than nasopharyngeal swab, presence of viral mutation(s) within the areas targeted by this assay, and inadequate number of viral copies(<138 copies/mL). A negative result must be combined with clinical observations, patient history, and epidemiological information. The expected result is Negative.  Fact Sheet for Patients:  EntrepreneurPulse.com.au  Fact Sheet for Healthcare Providers:  IncredibleEmployment.be  This test is no t yet approved or cleared by the [place] FDA and  has been authorized for detection and/or diagnosis of SARS-CoV-2 by FDA under an Emergency Use Authorization (EUA). This EUA will remain  in effect (meaning this test can be used) for the duration of the COVID-19 declaration under Section 564(b)(1) of the Act, 21 U.S.C.section 360bbb-3(b)(1), unless the authorization is terminated  or revoked sooner.       Influenza A by PCR NEGATIVE NEGATIVE Final   Influenza B by PCR NEGATIVE NEGATIVE Final    Comment: (NOTE) The Xpert Xpress SARS-CoV-2/FLU/RSV plus assay is intended as an aid in the diagnosis of influenza from Nasopharyngeal swab specimens and should not be used as a sole basis for treatment. Nasal washings and aspirates are unacceptable for Xpert Xpress SARS-CoV-2/FLU/RSV testing.  Fact  Sheet for Patients: EntrepreneurPulse.com.au  Fact Sheet for Healthcare Providers: IncredibleEmployment.be  This test is not yet approved or cleared by the [place] FDA and has been authorized for detection and/or diagnosis of SARS-CoV-2 by FDA under an Emergency Use Authorization (EUA). This EUA will remain in effect (meaning this test can be used) for the duration of the COVID-19 declaration under Section 564(b)(1) of the Act, 21 U.S.C. section 360bbb-3(b)(1), unless the authorization is terminated or revoked.  Performed at [facility], [address] [place], [place] [address]      Radiological Exams on Admission: CT Head Wo Contrast  Result Date: 11/06/2020 CLINICAL DATA:  Left arm weakness EXAM: CT HEAD WITHOUT CONTRAST TECHNIQUE: Contiguous axial images were obtained from the base of the skull through the vertex without intravenous contrast. COMPARISON:  None. FINDINGS: Brain: Tiny remote infarct within the right cerebellar hemisphere. Punctate lacunar infarct versus dilated perivascular space within the inferior right basal ganglia. No abnormal intra or extra-axial mass lesion or fluid collection. No abnormal mass effect or midline shift. No evidence of acute intracranial hemorrhage or infarct. Ventricular size is normal. Cerebellum unremarkable. Vascular: Unremarkable Skull: Intact Sinuses/Orbits: Mild mucosal thickening noted within the maxillary sinuses bilaterally as well as within several ethmoid air cells bilaterally. No air-fluid levels. Remaining paranasal sinuses are clear. Orbits are unremarkable. Other: Mastoid air cells and middle ear cavities are clear. IMPRESSION: No evidence of acute intracranial hemorrhage or infarct. Tiny remote right cerebellar and possible right basal ganglia infarcts. Electronically Signed   By: Fidela Salisbury MD   On: 11/06/2020 07:09   MR ANGIO HEAD WO CONTRAST  Result Date: 11/06/2020 CLINICAL  DATA:  Neuro deficit, acute, stroke suspected. EXAM: MRA HEAD WITHOUT CONTRAST TECHNIQUE: Angiographic images of the Circle of Willis were acquired using MRA technique without intravenous contrast. COMPARISON:  No pertinent prior exam. FINDINGS: The visualized portions of the distal cervical and intracranial internal carotid arteries are widely patent with normal flow related enhancement. The bilateral anterior cerebral arteries and middle cerebral arteries are widely patent with antegrade flow without high-grade flow-limiting stenosis or proximal branch occlusion. No intracranial aneurysm within the anterior circulation. The vertebral arteries are widely patent with antegrade flow. The posterior inferior cerebral arteries are normal. Vertebrobasilar junction and basilar artery are widely patent with antegrade flow without evidence of basilar stenosis or aneurysm. Posterior cerebral arteries are normal bilaterally. No intracranial aneurysm within the posterior circulation. IMPRESSION: No evidence of intracranial large vessel occlusion or hemodynamically significant stenosis. Electronically Signed   By: Pedro Earls M.D.   On: 11/06/2020 10:28   MR Angiogram Neck W or Wo Contrast  Result Date: 11/06/2020 CLINICAL DATA:  Neuro deficit, acute, stroke suspected. EXAM: MRA NECK WITHOUT AND WITH CONTRAST TECHNIQUE: Multiplanar and multiecho pulse sequences of the neck were obtained without and with intravenous contrast. Angiographic images of the neck were obtained using MRA technique without and with intravenous contrast. CONTRAST:  2mL GADAVIST GADOBUTROL 1 MMOL/ML IV SOLN COMPARISON:  CT angiogram of the chest Nov 06, 2020. FINDINGS: Aortic arch: Normal 3 vessel aortic branching pattern. The visualized subclavian arteries are normal. Mild luminal irregularity of the aortic arch correlating with atherosclerotic disease seen on prior CT angiogram of the chest. No significant stenosis or aneurysm.  Right carotid system: Mild atherosclerotic changes at the right carotid bifurcation without hemodynamically significant stenosis. Left carotid system: Atherosclerotic changes at of the left carotid bifurcation without hemodynamically significant stenosis. Vertebral arteries: Codominant. Vertebral artery  origins are normal. Vertebral arteries are normal in course and caliber to the vertebrobasilar confluence without stenosis or evidence of dissection. IMPRESSION: 1. Mild atherosclerotic changes in the bilateral carotid bifurcation without hemodynamically significant stenosis. 2. Mild atherosclerotic changes in the aortic arch. Electronically Signed   By: Pedro Earls M.D.   On: 11/06/2020 10:40   MR BRAIN WO CONTRAST  Result Date: 11/06/2020 CLINICAL DATA:  Neuro deficit, acute, stroke suspected. EXAM: MRI HEAD WITHOUT CONTRAST TECHNIQUE: Multiplanar, multiecho pulse sequences of the brain and surrounding structures were obtained without intravenous contrast. COMPARISON:  Head CT Nov 06, 2020 FINDINGS: Brain: No acute infarction, hemorrhage, hydrocephalus, extra-axial collection or mass lesion. Vascular: Normal flow voids. Skull and upper cervical spine: Normal marrow signal. Sinuses/Orbits: Mucosal thickening throughout the paranasal sinuses, more pronounced in the ethmoid cells. The orbits are maintained. IMPRESSION: 1. Unremarkable MRI of the brain. 2. Inflammatory paranasal sinus disease. Electronically Signed   By: Pedro Earls M.D.   On: 11/06/2020 10:10   MR Cervical Spine Wo Contrast  Result Date: 11/06/2020 CLINICAL DATA:  Spinal stenosis. Cervical radiculopathy. Known malignancy. EXAM: MRI CERVICAL SPINE WITHOUT CONTRAST TECHNIQUE: Multiplanar, multisequence MR imaging of the cervical spine was performed. No intravenous contrast was administered. COMPARISON:  MRI of the cervical spine January 26, 2018. FINDINGS: Alignment: Physiologic. Vertebrae: No fracture, evidence  of discitis, or bone lesion. Cord: Mass effect on the cord at C5-6 and C6-7, progressed from prior MRI, without cord signal abnormality. Posterior Fossa, vertebral arteries, paraspinal tissues: Negative. Disc levels: C2-3: No spinal canal or neural foraminal stenosis. C3-4: Tiny posterior disc protrusion without significant spinal canal stenosis. Uncovertebral and facet degenerative changes resulting in mild left neural foraminal narrowing. C4-5: Posterior disc protrusion causing small indentation of the thecal sac without significant spinal canal stenosis. Uncovertebral and facet degenerative changes resulting in mild left neural foraminal narrowing. C5-6: Posterior disc protrusion asymmetric to the right causing mild mass effect on the anterior cord surface and resulting in mild spinal canal stenosis, mildly progressed from prior MRI. Uncovertebral and facet degenerative changes resulting moderate right and severe left neural foraminal narrowing. C6-7: Posterior disc protrusion mildly asymmetric to the left causing mass effect on the cord without cord signal abnormality and resulting in moderate to severe spinal canal stenosis, progressed from prior MRI. Uncovertebral and facet degenerative changes resulting moderate right and severe left neural foraminal narrowing. C7-T1: Facet degenerative changes. No spinal canal or neural foraminal stenosis. IMPRESSION: 1. Degenerative changes of the cervical spine, more pronounced at C6-7 where there is moderate to severe spinal canal stenosis with mass effect on the cord, progressed from prior MRI. No cord signal abnormality. 2. Mild spinal canal stenosis at C5-6, mildly progressed from prior MRI. 3. Multilevel neural foraminal narrowing with moderate right and severe left neural foraminal narrowing at C5-6 and C6-7. Electronically Signed   By: Pedro Earls M.D.   On: 11/06/2020 10:23   CT Angio Chest/Abd/Pel for Dissection W and/or W/WO  Result Date:  11/06/2020 CLINICAL DATA:  Chest pain, left arm weakness EXAM: CT ANGIOGRAPHY CHEST, ABDOMEN AND PELVIS TECHNIQUE: Non-contrast CT of the chest was initially obtained. Multidetector CT imaging through the chest, abdomen and pelvis was performed using the standard protocol during bolus administration of intravenous contrast. Multiplanar reconstructed images and MIPs were obtained and reviewed to evaluate the vascular anatomy. CONTRAST:  70mL OMNIPAQUE IOHEXOL 350 MG/ML SOLN COMPARISON:  03/30/2020 FINDINGS: CTA CHEST FINDINGS Cardiovascular: The thoracic aorta is normal  in caliber. No evidence of intramural hematoma, dissection, or aneurysm. There is moderate largely atheromatous plaque predominantly within the descending thoracic aorta demonstrating a corrugated morphology. The arch vasculature demonstrates normal anatomic configuration and is widely patent proximally. Mild coronary artery calcification. Long segment left anterior descending coronary artery stenting has been performed. Global cardiac size is within normal limits. No pericardial effusion. The central pulmonary arteries are of normal caliber. Mediastinum/Nodes: The visualized thyroid is unremarkable. No pathologic thoracic adenopathy. The esophagus is unremarkable. Lungs/Pleura: Moderate centrilobular emphysema. No focal pulmonary nodules or infiltrates. Previously noted pulmonary infiltrates have resolved, in keeping with acute infection or inflammation on the prior examination. No pneumothorax or pleural effusion. Central airways are widely patent. There is mild central bronchial wall thickening again noted in keeping with chronic airway inflammation. Musculoskeletal: No acute bone abnormality. No lytic or blastic bone lesions identified. Review of the MIP images confirms the above findings. CTA ABDOMEN AND PELVIS FINDINGS VASCULAR Aorta: Normal caliber. No evidence of aneurysm or dissection. Extensive atheromatous plaque identified with moderate  fissuring resulting in a corrugated pattern. No periaortic inflammatory change identified. Celiac: Less than 50% stenosis of the origin. Normal anatomic configuration. Distally widely patent. No evidence of aneurysm or dissection. SMA: Less than 50% stenosis at its origin. Distally widely patent. No aneurysm or dissection. Renals: Single renal arteries bilaterally. 50% stenosis of the right renal artery at its origin secondary to atherosclerotic plaque. Less than 50% stenosis of the left renal artery at its origin. Normal vascular morphology. No aneurysm. IMA: 50% stenosis at the origin.  Distally widely patent. Inflow: Moderate mixed atherosclerotic plaque within the right common iliac artery without evidence of hemodynamically significant stenosis. Left common iliac artery is widely patent. No evidence of aneurysm or dissection. Internal iliac arteries are patent at their origins bilaterally. Veins: No obvious venous abnormality within the limitations of this arterial phase study. Review of the MIP images confirms the above findings. NON-VASCULAR Hepatobiliary: The liver is unremarkable. The gallbladder is unremarkable. Similar to that noted on prior CT examination of 10/10/2020, the extrahepatic bile duct is dilated measuring up to 1 cm in greatest dimension, new since remote prior examination of 03/30/2020. As noted previously, a distal obstructing lesion is not clearly identified on this examination. Pancreas: Unremarkable Spleen: Unremarkable Adrenals/Urinary Tract: Adrenal glands are unremarkable. Kidneys are normal, without renal calculi, focal lesion, or hydronephrosis. Bladder is unremarkable. Stomach/Bowel: There is moderate stool throughout the colon. The stomach, small bowel, and large bowel are otherwise unremarkable. Appendix normal. No free intraperitoneal gas or fluid. Lymphatic: No pathologic adenopathy within the abdomen and pelvis. Reproductive: There is extensive soft tissue thickening at the  introitus and involving the medial aspects of the vulva bilaterally, similar to prior examination. Inflammatory changes extend posteriorly to involve the anus. There is asymmetric thickening of the right wall of the rectum, best appreciated on axial image # 195/5, more prominent than that seen on prior examination. Other: Tiny fat containing umbilical hernia noted. Musculoskeletal: No acute bone abnormality. No lytic or blastic bone lesions. Review of the MIP images confirms the above findings. IMPRESSION: No evidence of a thoracoabdominal aortic aneurysm or dissection. Moderate largely atheromatous plaque demonstrating extensive fissuring within the a descending thoracic and abdominal aorta. Clinical correlation for evidence of embolization (blue toe syndrome, livedo reticularis) may be helpful for further management. 50% stenosis of the right renal artery at its origin. 50% stenosis of the inferior mesenteric artery at its origin. The wide patency of the celiac axis  and superior mesenteric artery however, would not support the diagnosis of chronic mesenteric ischemia. Mild coronary artery calcification with long segment stenting of the left anterior descending coronary artery. Moderate centrilobular emphysema. Persistent dilation of the extrahepatic bile duct without obvious cause identified. Correlation with liver enzymes would be helpful to assess for an obstructing process. If indicated, ERCP or MRCP examination may be more helpful for further evaluation. Moderate stool throughout the colon. Asymmetric thickening of the a distal rectum may result in some degree of obstruction and resultant fecal retention. Soft tissue thickening appears confluent with that seen within the introitus and perineum noted on multiple prior examinations. Aortic Atherosclerosis (ICD10-I70.0) and Emphysema (ICD10-J43.9). Electronically Signed   By: Fidela Salisbury MD   On: 11/06/2020 07:38     EKG: I have personally reviewed.  Sinus  rhythm, QTC 479, LAE, poor R wave progression, borderline left axis deviation  Assessment/Plan Principal Problem:   Spinal stenosis of cervical region Active Problems:   GERD (gastroesophageal reflux disease)   Hyperlipidemia LDL goal <70   Chest pain   Chronic systolic heart failure (HCC)   CAD (coronary artery disease)   Anxiety associated with depression   Vulvar cancer (HCC)   Diabetes mellitus without complication (HCC)   Hemoptysis   Dilated bile duct   Spinal stenosis of cervical region: Patient's left arm weakness is likely due to cervical spine stenosis. MRI of C spin showed degenerative disc disease C5-C7, with mass-effect on spinal cord.  Dr. Izora Ribas of neurosurgery is consulted, will do surgery today. Due to multiple cardiac issues, I consulted Dr. Rockey Situ of cardiology for presurgical clearance.  -Admitted to progressive bed as inpatient -Follow-up with Dr. Rhea Bleacher recommendations -pt/ot when able to  GERD (gastroesophageal reflux disease) -Protonix and Pepcid  Hyperlipidemia LDL goal <70 -Lipitor  Chest pain and history of CAD: S/p of DES 2019.  Troponin 12, 11.  Her chest pain is close to the front neck, may be related to the spinal cord stenosis -Trend troponin -Continue aspirin -Hold Brilinta due to hemoptysis -check A1c, FLP  Chronic systolic heart failure ([place]): 2D echo on 03/25/2018 showed EF of 45-50%.  Patient does not have leg edema or JVD.  CHF seem to be compensated. -Check BNP --> 58.7  Anxiety associated with depression -Continue home medications  Diabetes mellitus without complication [facility]): Recent A1c 10.4, poorly controlled.  Patient still taking 70/30 insulin. -70/30 insulin, decrease dose from 18 to 9 unit twice daily -Sliding scale insulin  Hemoptysis: CT angiograms negative for aortic dissection or central PE -Hold Brilinta  Dilated bile duct: CT angiogram incidentally showed extrahepatic bile duct dilation.  No GI symptoms.   Liver function normal. -Monitoring for any GI symptoms  Valvar cancer: s/p of surgery and XRT. -pt is high dose pain med, OxyContin 80 mg twice daily and as needed oxycodone 30 mg every 8 hours   DVT ppx: SCD Code Status: Full code Family Communication:  Yes, patient's daughter by phone  Disposition Plan:  Anticipate discharge back to previous environment Consults called:  Dr. Izora Ribas of neurosurgery Admission status and Level of care: Progressive Cardiac:   as inpt      Status is: Inpatient  Remains inpatient appropriate because:Inpatient level of care appropriate due to severity of illness   Dispo: The patient is from: Home              Anticipated d/c is to: Home  Patient currently is not medically stable to d/c.   Difficult to place patient No           Date of Service 11/06/2020    Ivor [facility]   If 7PM-7AM, please contact night-coverage [email or web address] 11/06/2020, 6:22 PM

## 2020-11-07 ENCOUNTER — Encounter: Payer: Self-pay | Admitting: Neurosurgery

## 2020-11-07 ENCOUNTER — Inpatient Hospital Stay: Payer: Medicare Other

## 2020-11-07 ENCOUNTER — Inpatient Hospital Stay (HOSPITAL_COMMUNITY)
Admit: 2020-11-07 | Discharge: 2020-11-07 | Disposition: A | Discharge status: Home / Self Care | Payer: Medicare Other | Attending: Neurosurgery | Admitting: Neurosurgery

## 2020-11-07 DIAGNOSIS — I255 Ischemic cardiomyopathy: Secondary | ICD-10-CM

## 2020-11-07 DIAGNOSIS — E119 Type 2 diabetes mellitus without complications: Secondary | ICD-10-CM | POA: Diagnosis not present

## 2020-11-07 DIAGNOSIS — M4802 Spinal stenosis, cervical region: Secondary | ICD-10-CM | POA: Diagnosis not present

## 2020-11-07 DIAGNOSIS — I25118 Atherosclerotic heart disease of native coronary artery with other forms of angina pectoris: Secondary | ICD-10-CM | POA: Diagnosis not present

## 2020-11-07 DIAGNOSIS — R29818 Other symptoms and signs involving the nervous system: Secondary | ICD-10-CM

## 2020-11-07 DIAGNOSIS — R079 Chest pain, unspecified: Secondary | ICD-10-CM

## 2020-11-07 DIAGNOSIS — Z981 Arthrodesis status: Secondary | ICD-10-CM

## 2020-11-07 LAB — GLUCOSE, CAPILLARY
Glucose-Capillary: 316 mg/dL — ABNORMAL HIGH (ref 70–99)
Glucose-Capillary: 393 mg/dL — ABNORMAL HIGH (ref 70–99)
Glucose-Capillary: 191 mg/dL — ABNORMAL HIGH (ref 70–99)
Glucose-Capillary: 140 mg/dL — ABNORMAL HIGH (ref 70–99)

## 2020-11-07 LAB — CBC
HCT: 37.6 % (ref 36.0–46.0)
Hemoglobin: 12.7 g/dL (ref 12.0–15.0)
MCH: 31 pg (ref 26.0–34.0)
MCHC: 33.8 g/dL (ref 30.0–36.0)
MCV: 91.7 fL (ref 80.0–100.0)
Platelets: 278 10*3/uL (ref 150–400)
RBC: 4.1 MIL/uL (ref 3.87–5.11)
RDW: 13.1 % (ref 11.5–15.5)
WBC: 12.7 10*3/uL — ABNORMAL HIGH (ref 4.0–10.5)
nRBC: 0 % (ref 0.0–0.2)

## 2020-11-07 LAB — BASIC METABOLIC PANEL
Anion gap: 11 (ref 5–15)
BUN: 12 mg/dL (ref 6–20)
CO2: 20 mmol/L — ABNORMAL LOW (ref 22–32)
Calcium: 8.7 mg/dL — ABNORMAL LOW (ref 8.9–10.3)
Chloride: 104 mmol/L (ref 98–111)
Creatinine, Ser: 0.51 mg/dL (ref 0.44–1.00)
GFR, Estimated: >60 mL/min (ref >60)
Glucose, Bld: 370 mg/dL — ABNORMAL HIGH (ref 70–99)
Potassium: 4.1 mmol/L (ref 3.5–5.1)
Sodium: 135 mmol/L (ref 135–145)

## 2020-11-07 LAB — LIPID PANEL
Cholesterol: 165 mg/dL (ref 0–200)
HDL: 43 mg/dL (ref >40)
LDL Cholesterol: 106 mg/dL — ABNORMAL HIGH (ref 0–99)
Total CHOL/HDL Ratio: 3.8 RATIO
Triglycerides: 78 mg/dL (ref <150)
VLDL: 16 mg/dL (ref 0–40)

## 2020-11-07 LAB — ECHOCARDIOGRAM COMPLETE
Area-P 1/2: 3.72 cm2
Height: 61 in
S' Lateral: 2.73 cm
Weight: 2044.8 oz

## 2020-11-07 MED ORDER — ENOXAPARIN SODIUM 40 MG/0.4ML IJ SOSY
40.0000 mg | PREFILLED_SYRINGE | INTRAMUSCULAR | Status: DC
  Administered 2020-11-07 – 2020-11-13 (×7): 40 mg via SUBCUTANEOUS
  Filled 2020-11-07 (×7): qty 0.4

## 2020-11-07 MED ORDER — LIVING WELL WITH DIABETES BOOK
Freq: Once | Status: AC
  Filled 2020-11-07: qty 1

## 2020-11-07 MED ORDER — INSULIN ASPART PROT & ASPART (70-30 MIX) 100 UNIT/ML ~~LOC~~ SUSP
12.0000 [IU] | Freq: Two times a day (BID) | SUBCUTANEOUS | Status: DC
  Administered 2020-11-07 – 2020-11-11 (×8): 12 [IU] via SUBCUTANEOUS
  Filled 2020-11-07 (×9): qty 10

## 2020-11-07 NOTE — Progress Notes (Signed)
PROGRESS NOTE    Renee Mack  ZYS:[id number] DOB: [birth year] DOA: 11/06/2020 PCP: Olin Hauser, DO   Brief Narrative:  55 y.o. female with medical history significant of HLD, DM, CAD with DES, sCHF with EF 45-50%, vulvar CA (s/p of surgery and radiation therapy), COPD, GERD, depression with anxiety, former smoker, pancreatitis, DKA, who presents with chest pain and left arm weakness.  Pt states that her chest pain started at about 4:00 AM today. Her chest pain is located in the upper chest close to the neck, 8 out of 10 severity, sharp, radiating to the left arm.  Patient has some mild shortness of breath.  Patient has mild cough, no fever or chills. Patient states that she coughed up a little bright red blood, just one episode.  Patient also reports left arm weakness and left toe numbness.  He has some left upper arm pain.   Found on MRI imaging survey to have degenerative changes of cervical spine most pronounced at C6-7.  Moderate severe spinal canal stenosis with mass-effect on the cord which is progressed from prior MRI.  Patient was emergently taken to the operating room with neurosurgery on 5/19 for cervical discectomy and fusion.  Tolerated procedure well.  Postop day 1 evaluated by neurosurgery.  Felt to be stable postoperatively.   Assessment & Plan:   Principal Problem:   Spinal stenosis of cervical region Active Problems:   GERD (gastroesophageal reflux disease)   Hyperlipidemia LDL goal <70   Chest pain   Chronic systolic heart failure (HCC)   CAD (coronary artery disease)   Anxiety associated with depression   Vulvar cancer (HCC)   Diabetes mellitus without complication (HCC)   Hemoptysis   Dilated bile duct  Spinal stenosis of cervical region  Patient's left arm weakness is likely due to cervical spine stenosis.  MRI of C spin showed degenerative disc disease C5-C7, with mass-effect on spinal cord.   Dr. Izora Ribas of neurosurgery is consulted Status post  cervical discectomy with fusion 5/19 Plan: DVT prophylaxis Therapy evaluations TOC consult As needed pain control  GERD (gastroesophageal reflux disease) Continue home Protonix and Pepcid  Hyperlipidemia LDL goal <70 Continue home Lipitor  Chest pain and history of CAD: S/p of DES 2019.  Troponin 12, 11.   Her chest pain is close to the front neck, may be related to the spinal cord stenosis Not indicative of ACS Plan: Cardiology consulted, recommendations appreciated Continue aspirin 81 daily Hold Brilinta postoperatively  Chronic systolic heart failure ([place])  2D echo on 03/25/2018 showed EF of 45-50%.   Patient does not have leg edema or JVD.   CHF seem to be compensated. -Check BNP --> 58.7  Anxiety associated with depression -Continue home medications  Diabetes mellitus without complication (HCC)  Recent A1c 10.4, poorly controlled.   Patient still taking 70/30 insulin. Sugars remain elevated Plan:. Continue 70/30 insulin.  Dose increased to 12 units twice daily Every 4 hours sliding scale Diabetes coordinator consult Carb modified diet  Hemoptysis CT angiograms negative for aortic dissection or central PE -Hold Brilinta -Continue aspirin  Dilated bile duct  CT angiogram incidentally showed extrahepatic bile duct dilation.   No GI symptoms.  Liver function normal. -Monitoring for any GI symptoms  Vulvar cancer  s/p of surgery and XRT. -pt is high dose pain med,  OxyContin 80 mg twice daily and as needed oxycodone 30 mg every 8 hours   DVT prophylaxis: SQ Lovenox  code Status: Full Family Communication: None Disposition Plan:  Status is: Inpatient  Remains inpatient appropriate because:Inpatient level of care appropriate due to severity of illness   Dispo: The patient is from: Home              Anticipated d/c is to: Home              Patient currently is not medically stable to d/c.   Difficult to place patient No  Postop day 1 status  post anterior cervical discectomy and fusion.  Postoperative pain control management.  We will attempt to optimize medical comorbidities.     Level of care: Med-Surg  Consultants:   Neurosurgery  Cardiology  Procedures:  Anterior cervical discectomy and fusion, 5/19  Antimicrobials:   None   Subjective: Patient seen and examined.  Sleeping this morning but easily aroused.  No pain complaints.  Objective: Vitals:   11/06/20 2217 11/07/20 0330 11/07/20 0721 11/07/20 1132  BP: 128/66 125/77 122/67 102/65  Pulse: (!) 111 (!) 108 (!) 108 83  Resp: 18  17 17   Temp: 97.8 F (36.6 C) 98.6 F (37 C) 98.6 F (37 C) 98.4 F (36.9 C)  TempSrc: Oral Oral Axillary   SpO2: 94%  94% 99%  Weight:  58 kg    Height:        Intake/Output Summary (Last 24 hours) at 11/07/2020 1416 Last data filed at 11/06/2020 1928 Gross per 24 hour  Intake 750 ml  Output 20 ml  Net 730 ml   Filed Weights   11/06/20 0621 11/06/20 1702 11/07/20 0330  Weight: 58.5 kg 59 kg 58 kg    Examination:  General exam: No acute distress.  Sleepy Respiratory system: Clear to auscultation. Respiratory effort normal. Cardiovascular system: S1 & S2 heard, RRR. No JVD, murmurs, rubs, gallops or clicks. No pedal edema. Gastrointestinal system: Abdomen is nondistended, soft and nontender. No organomegaly or masses felt. Normal bowel sounds heard. Central nervous system: Alert and oriented. No focal neurological deficits. Extremities: Decreased power in upper extremities Skin: No rashes, lesions or ulcers Psychiatry: Judgement and insight appear normal. Mood & affect appropriate.     Data Reviewed: I have personally reviewed following labs and imaging studies  CBC: Recent Labs  Lab 11/06/20 0629 11/07/20 0520  WBC 6.7 12.7*  NEUTROABS 4.0  --   HGB 14.3 12.7  HCT 42.4 37.6  MCV 91.4 91.7  PLT 298 010   Basic Metabolic Panel: Recent Labs  Lab 11/06/20 0629 11/07/20 0520  NA 137 135  K 4.1  4.1  CL 102 104  CO2 25 20*  GLUCOSE 281* 370*  BUN 9 12  CREATININE 0.60 0.51  CALCIUM 9.4 8.7*  MG 1.7  --    GFR: Estimated Creatinine Clearance: 64.3 mL/min (by C-G formula based on SCr of 0.51 mg/dL). Liver Function Tests: Recent Labs  Lab 11/06/20 0629  AST 13*  ALT 12  ALKPHOS 98  BILITOT 0.5  PROT 7.5  ALBUMIN 3.5   No results for input(s): LIPASE, AMYLASE in the last 168 hours. No results for input(s): AMMONIA in the last 168 hours. Coagulation Profile: Recent Labs  Lab 11/06/20 0629  INR 1.0   Cardiac Enzymes: No results for input(s): CKTOTAL, CKMB, CKMBINDEX, TROPONINI in the last 168 hours. BNP (last 3 results) No results for input(s): PROBNP in the last 8760 hours. HbA1C: Recent Labs    11/06/20 1350  HGBA1C 11.1*   CBG: Recent Labs  Lab 11/06/20 1643 11/06/20 2048 11/07/20 0722 11/07/20 1132  GLUCAP 308*  261* 316* 393*   Lipid Profile: Recent Labs    11/07/20 0520  CHOL 165  HDL 43  LDLCALC 106*  TRIG 78  CHOLHDL 3.8   Thyroid Function Tests: No results for input(s): TSH, T4TOTAL, FREET4, T3FREE, THYROIDAB in the last 72 hours. Anemia Panel: No results for input(s): VITAMINB12, FOLATE, FERRITIN, TIBC, IRON, RETICCTPCT in the last 72 hours. Sepsis Labs: No results for input(s): PROCALCITON, LATICACIDVEN in the last 168 hours.  Recent Results (from the past 240 hour(s))  Resp Panel by RT-PCR (Flu A&B, Covid) Nasopharyngeal Swab     Status: None   Collection Time: 11/06/20  7:17 AM   Specimen: Nasopharyngeal Swab; Nasopharyngeal(NP) swabs in vial transport medium  Result Value Ref Range Status   SARS Coronavirus 2 by RT PCR NEGATIVE NEGATIVE Final    Comment: (NOTE) SARS-CoV-2 target nucleic acids are NOT DETECTED.  The SARS-CoV-2 RNA is generally detectable in upper respiratory specimens during the acute phase of infection. The lowest concentration of SARS-CoV-2 viral copies this assay can detect is 138 copies/mL. A negative  result does not preclude SARS-Cov-2 infection and should not be used as the sole basis for treatment or other patient management decisions. A negative result may occur with  improper specimen collection/handling, submission of specimen other than nasopharyngeal swab, presence of viral mutation(s) within the areas targeted by this assay, and inadequate number of viral copies(<138 copies/mL). A negative result must be combined with clinical observations, patient history, and epidemiological information. The expected result is Negative.  Fact Sheet for Patients:  EntrepreneurPulse.com.au  Fact Sheet for Healthcare Providers:  IncredibleEmployment.be  This test is no t yet approved or cleared by the [place] FDA and  has been authorized for detection and/or diagnosis of SARS-CoV-2 by FDA under an Emergency Use Authorization (EUA). This EUA will remain  in effect (meaning this test can be used) for the duration of the COVID-19 declaration under Section 564(b)(1) of the Act, 21 U.S.C.section 360bbb-3(b)(1), unless the authorization is terminated  or revoked sooner.       Influenza A by PCR NEGATIVE NEGATIVE Final   Influenza B by PCR NEGATIVE NEGATIVE Final    Comment: (NOTE) The Xpert Xpress SARS-CoV-2/FLU/RSV plus assay is intended as an aid in the diagnosis of influenza from Nasopharyngeal swab specimens and should not be used as a sole basis for treatment. Nasal washings and aspirates are unacceptable for Xpert Xpress SARS-CoV-2/FLU/RSV testing.  Fact Sheet for Patients: EntrepreneurPulse.com.au  Fact Sheet for Healthcare Providers: IncredibleEmployment.be  This test is not yet approved or cleared by the [place] FDA and has been authorized for detection and/or diagnosis of SARS-CoV-2 by FDA under an Emergency Use Authorization (EUA). This EUA will remain in effect (meaning this test can be used)  for the duration of the COVID-19 declaration under Section 564(b)(1) of the Act, 21 U.S.C. section 360bbb-3(b)(1), unless the authorization is terminated or revoked.  Performed at [facility], [address] [place], [place] [address]          Radiology Studies: DG Cervical Spine 2 or 3 views  Result Date: 11/07/2020 CLINICAL DATA:  Status post cervical fusion EXAM: CERVICAL SPINE - 2-3 VIEW COMPARISON:  Intraoperative cervical spine images Nov 06, 2020; cervical MR Nov 06, 2020 FINDINGS: Frontal and lateral views were obtained. Patient is status post anterior screw and plate fixation from [id number] with disc spacers at C5-6 and C6-7. Support hardware intact. No fracture or spondylolisthesis. Air in the prevertebral soft tissues is consistent  with recent surgery. Predental space region normal. Disc spaces appear unremarkable. Occasional foci of carotid artery calcification bilaterally noted. Visualized lung apices clear. IMPRESSION: Anterior screw and plate fixation from [id number] with disc spacers at C5-6 and C6-7. Support hardware intact. No fracture or spondylolisthesis. No evident arthropathy. Prevertebral soft tissue air consistent with recent surgery. Scattered foci of carotid artery calcification bilaterally noted. Electronically Signed   By: Lowella Grip III M.D.   On: 11/07/2020 13:20   DG Cervical Spine 2-3 Views  Result Date: 11/06/2020 CLINICAL DATA:  Anterior cervical decompression EXAM: CERVICAL SPINE - 2-3 VIEW COMPARISON:  MRI from earlier in the same day. FLUOROSCOPY TIME:  Radiation Exposure Index (as provided by the fluoroscopic device): 0.75 mGy If the device does not provide the exposure index: Fluoroscopy Time:  5 seconds Number of Acquired Images:  2 FINDINGS: Initial images show a surgical instrument at the anterior aspect of the C5-6 interspace. Subsequently anterior fixation plate from [id number] is noted. IMPRESSION: C5-C7 cervical fusion. Electronically Signed    By: Inez Catalina M.D.   On: 11/06/2020 20:24   CT Head Wo Contrast  Result Date: 11/06/2020 CLINICAL DATA:  Left arm weakness EXAM: CT HEAD WITHOUT CONTRAST TECHNIQUE: Contiguous axial images were obtained from the base of the skull through the vertex without intravenous contrast. COMPARISON:  None. FINDINGS: Brain: Tiny remote infarct within the right cerebellar hemisphere. Punctate lacunar infarct versus dilated perivascular space within the inferior right basal ganglia. No abnormal intra or extra-axial mass lesion or fluid collection. No abnormal mass effect or midline shift. No evidence of acute intracranial hemorrhage or infarct. Ventricular size is normal. Cerebellum unremarkable. Vascular: Unremarkable Skull: Intact Sinuses/Orbits: Mild mucosal thickening noted within the maxillary sinuses bilaterally as well as within several ethmoid air cells bilaterally. No air-fluid levels. Remaining paranasal sinuses are clear. Orbits are unremarkable. Other: Mastoid air cells and middle ear cavities are clear. IMPRESSION: No evidence of acute intracranial hemorrhage or infarct. Tiny remote right cerebellar and possible right basal ganglia infarcts. Electronically Signed   By: Fidela Salisbury MD   On: 11/06/2020 07:09   MR ANGIO HEAD WO CONTRAST  Result Date: 11/06/2020 CLINICAL DATA:  Neuro deficit, acute, stroke suspected. EXAM: MRA HEAD WITHOUT CONTRAST TECHNIQUE: Angiographic images of the Circle of Willis were acquired using MRA technique without intravenous contrast. COMPARISON:  No pertinent prior exam. FINDINGS: The visualized portions of the distal cervical and intracranial internal carotid arteries are widely patent with normal flow related enhancement. The bilateral anterior cerebral arteries and middle cerebral arteries are widely patent with antegrade flow without high-grade flow-limiting stenosis or proximal branch occlusion. No intracranial aneurysm within the anterior circulation. The vertebral  arteries are widely patent with antegrade flow. The posterior inferior cerebral arteries are normal. Vertebrobasilar junction and basilar artery are widely patent with antegrade flow without evidence of basilar stenosis or aneurysm. Posterior cerebral arteries are normal bilaterally. No intracranial aneurysm within the posterior circulation. IMPRESSION: No evidence of intracranial large vessel occlusion or hemodynamically significant stenosis. Electronically Signed   By: Pedro Earls M.D.   On: 11/06/2020 10:28   MR Angiogram Neck W or Wo Contrast  Result Date: 11/06/2020 CLINICAL DATA:  Neuro deficit, acute, stroke suspected. EXAM: MRA NECK WITHOUT AND WITH CONTRAST TECHNIQUE: Multiplanar and multiecho pulse sequences of the neck were obtained without and with intravenous contrast. Angiographic images of the neck were obtained using MRA technique without and with intravenous contrast. CONTRAST:  73mL GADAVIST GADOBUTROL 1 MMOL/ML IV  SOLN COMPARISON:  CT angiogram of the chest Nov 06, 2020. FINDINGS: Aortic arch: Normal 3 vessel aortic branching pattern. The visualized subclavian arteries are normal. Mild luminal irregularity of the aortic arch correlating with atherosclerotic disease seen on prior CT angiogram of the chest. No significant stenosis or aneurysm. Right carotid system: Mild atherosclerotic changes at the right carotid bifurcation without hemodynamically significant stenosis. Left carotid system: Atherosclerotic changes at of the left carotid bifurcation without hemodynamically significant stenosis. Vertebral arteries: Codominant. Vertebral artery origins are normal. Vertebral arteries are normal in course and caliber to the vertebrobasilar confluence without stenosis or evidence of dissection. IMPRESSION: 1. Mild atherosclerotic changes in the bilateral carotid bifurcation without hemodynamically significant stenosis. 2. Mild atherosclerotic changes in the aortic arch.  Electronically Signed   By: Pedro Earls M.D.   On: 11/06/2020 10:40   MR BRAIN WO CONTRAST  Result Date: 11/06/2020 CLINICAL DATA:  Neuro deficit, acute, stroke suspected. EXAM: MRI HEAD WITHOUT CONTRAST TECHNIQUE: Multiplanar, multiecho pulse sequences of the brain and surrounding structures were obtained without intravenous contrast. COMPARISON:  Head CT Nov 06, 2020 FINDINGS: Brain: No acute infarction, hemorrhage, hydrocephalus, extra-axial collection or mass lesion. Vascular: Normal flow voids. Skull and upper cervical spine: Normal marrow signal. Sinuses/Orbits: Mucosal thickening throughout the paranasal sinuses, more pronounced in the ethmoid cells. The orbits are maintained. IMPRESSION: 1. Unremarkable MRI of the brain. 2. Inflammatory paranasal sinus disease. Electronically Signed   By: Pedro Earls M.D.   On: 11/06/2020 10:10   MR Cervical Spine Wo Contrast  Result Date: 11/06/2020 CLINICAL DATA:  Spinal stenosis. Cervical radiculopathy. Known malignancy. EXAM: MRI CERVICAL SPINE WITHOUT CONTRAST TECHNIQUE: Multiplanar, multisequence MR imaging of the cervical spine was performed. No intravenous contrast was administered. COMPARISON:  MRI of the cervical spine January 26, 2018. FINDINGS: Alignment: Physiologic. Vertebrae: No fracture, evidence of discitis, or bone lesion. Cord: Mass effect on the cord at C5-6 and C6-7, progressed from prior MRI, without cord signal abnormality. Posterior Fossa, vertebral arteries, paraspinal tissues: Negative. Disc levels: C2-3: No spinal canal or neural foraminal stenosis. C3-4: Tiny posterior disc protrusion without significant spinal canal stenosis. Uncovertebral and facet degenerative changes resulting in mild left neural foraminal narrowing. C4-5: Posterior disc protrusion causing small indentation of the thecal sac without significant spinal canal stenosis. Uncovertebral and facet degenerative changes resulting in mild left  neural foraminal narrowing. C5-6: Posterior disc protrusion asymmetric to the right causing mild mass effect on the anterior cord surface and resulting in mild spinal canal stenosis, mildly progressed from prior MRI. Uncovertebral and facet degenerative changes resulting moderate right and severe left neural foraminal narrowing. C6-7: Posterior disc protrusion mildly asymmetric to the left causing mass effect on the cord without cord signal abnormality and resulting in moderate to severe spinal canal stenosis, progressed from prior MRI. Uncovertebral and facet degenerative changes resulting moderate right and severe left neural foraminal narrowing. C7-T1: Facet degenerative changes. No spinal canal or neural foraminal stenosis. IMPRESSION: 1. Degenerative changes of the cervical spine, more pronounced at C6-7 where there is moderate to severe spinal canal stenosis with mass effect on the cord, progressed from prior MRI. No cord signal abnormality. 2. Mild spinal canal stenosis at C5-6, mildly progressed from prior MRI. 3. Multilevel neural foraminal narrowing with moderate right and severe left neural foraminal narrowing at C5-6 and C6-7. Electronically Signed   By: Pedro Earls M.D.   On: 11/06/2020 10:23   DG C-Arm 1-60 Min-No Report  Result  Date: 11/06/2020 Fluoroscopy was utilized by the requesting physician.  No radiographic interpretation.   CT Angio Chest/Abd/Pel for Dissection W and/or W/WO  Result Date: 11/06/2020 CLINICAL DATA:  Chest pain, left arm weakness EXAM: CT ANGIOGRAPHY CHEST, ABDOMEN AND PELVIS TECHNIQUE: Non-contrast CT of the chest was initially obtained. Multidetector CT imaging through the chest, abdomen and pelvis was performed using the standard protocol during bolus administration of intravenous contrast. Multiplanar reconstructed images and MIPs were obtained and reviewed to evaluate the vascular anatomy. CONTRAST:  10mL OMNIPAQUE IOHEXOL 350 MG/ML SOLN  COMPARISON:  03/30/2020 FINDINGS: CTA CHEST FINDINGS Cardiovascular: The thoracic aorta is normal in caliber. No evidence of intramural hematoma, dissection, or aneurysm. There is moderate largely atheromatous plaque predominantly within the descending thoracic aorta demonstrating a corrugated morphology. The arch vasculature demonstrates normal anatomic configuration and is widely patent proximally. Mild coronary artery calcification. Long segment left anterior descending coronary artery stenting has been performed. Global cardiac size is within normal limits. No pericardial effusion. The central pulmonary arteries are of normal caliber. Mediastinum/Nodes: The visualized thyroid is unremarkable. No pathologic thoracic adenopathy. The esophagus is unremarkable. Lungs/Pleura: Moderate centrilobular emphysema. No focal pulmonary nodules or infiltrates. Previously noted pulmonary infiltrates have resolved, in keeping with acute infection or inflammation on the prior examination. No pneumothorax or pleural effusion. Central airways are widely patent. There is mild central bronchial wall thickening again noted in keeping with chronic airway inflammation. Musculoskeletal: No acute bone abnormality. No lytic or blastic bone lesions identified. Review of the MIP images confirms the above findings. CTA ABDOMEN AND PELVIS FINDINGS VASCULAR Aorta: Normal caliber. No evidence of aneurysm or dissection. Extensive atheromatous plaque identified with moderate fissuring resulting in a corrugated pattern. No periaortic inflammatory change identified. Celiac: Less than 50% stenosis of the origin. Normal anatomic configuration. Distally widely patent. No evidence of aneurysm or dissection. SMA: Less than 50% stenosis at its origin. Distally widely patent. No aneurysm or dissection. Renals: Single renal arteries bilaterally. 50% stenosis of the right renal artery at its origin secondary to atherosclerotic plaque. Less than 50% stenosis  of the left renal artery at its origin. Normal vascular morphology. No aneurysm. IMA: 50% stenosis at the origin.  Distally widely patent. Inflow: Moderate mixed atherosclerotic plaque within the right common iliac artery without evidence of hemodynamically significant stenosis. Left common iliac artery is widely patent. No evidence of aneurysm or dissection. Internal iliac arteries are patent at their origins bilaterally. Veins: No obvious venous abnormality within the limitations of this arterial phase study. Review of the MIP images confirms the above findings. NON-VASCULAR Hepatobiliary: The liver is unremarkable. The gallbladder is unremarkable. Similar to that noted on prior CT examination of 10/10/2020, the extrahepatic bile duct is dilated measuring up to 1 cm in greatest dimension, new since remote prior examination of 03/30/2020. As noted previously, a distal obstructing lesion is not clearly identified on this examination. Pancreas: Unremarkable Spleen: Unremarkable Adrenals/Urinary Tract: Adrenal glands are unremarkable. Kidneys are normal, without renal calculi, focal lesion, or hydronephrosis. Bladder is unremarkable. Stomach/Bowel: There is moderate stool throughout the colon. The stomach, small bowel, and large bowel are otherwise unremarkable. Appendix normal. No free intraperitoneal gas or fluid. Lymphatic: No pathologic adenopathy within the abdomen and pelvis. Reproductive: There is extensive soft tissue thickening at the introitus and involving the medial aspects of the vulva bilaterally, similar to prior examination. Inflammatory changes extend posteriorly to involve the anus. There is asymmetric thickening of the right wall of the rectum, best appreciated on axial  image # 195/5, more prominent than that seen on prior examination. Other: Tiny fat containing umbilical hernia noted. Musculoskeletal: No acute bone abnormality. No lytic or blastic bone lesions. Review of the MIP images confirms the  above findings. IMPRESSION: No evidence of a thoracoabdominal aortic aneurysm or dissection. Moderate largely atheromatous plaque demonstrating extensive fissuring within the a descending thoracic and abdominal aorta. Clinical correlation for evidence of embolization (blue toe syndrome, livedo reticularis) may be helpful for further management. 50% stenosis of the right renal artery at its origin. 50% stenosis of the inferior mesenteric artery at its origin. The wide patency of the celiac axis and superior mesenteric artery however, would not support the diagnosis of chronic mesenteric ischemia. Mild coronary artery calcification with long segment stenting of the left anterior descending coronary artery. Moderate centrilobular emphysema. Persistent dilation of the extrahepatic bile duct without obvious cause identified. Correlation with liver enzymes would be helpful to assess for an obstructing process. If indicated, ERCP or MRCP examination may be more helpful for further evaluation. Moderate stool throughout the colon. Asymmetric thickening of the a distal rectum may result in some degree of obstruction and resultant fecal retention. Soft tissue thickening appears confluent with that seen within the introitus and perineum noted on multiple prior examinations. Aortic Atherosclerosis (ICD10-I70.0) and Emphysema (ICD10-J43.9). Electronically Signed   By: Fidela Salisbury MD   On: 11/06/2020 07:38        Scheduled Meds: . amitriptyline  75 mg Oral QHS  . aspirin EC  81 mg Oral Daily  . atorvastatin  80 mg Oral q1800  . enoxaparin (LOVENOX) injection  40 mg Subcutaneous Q24H  . famotidine  20 mg Oral BID  . insulin aspart  0-5 Units Subcutaneous QHS  . insulin aspart  0-9 Units Subcutaneous TID WC  . insulin aspart protamine- aspart  12 Units Subcutaneous BID WC  . living well with diabetes book   Does not apply Once  . mirtazapine  15 mg Oral QHS  . oxyCODONE  80 mg Oral Q8H  . pantoprazole  40 mg  Oral QAC breakfast  . ranolazine  500 mg Oral BID  . silver sulfADIAZINE   Topical Daily  . sucralfate  1 g Oral QID   Continuous Infusions: . sodium chloride 50 mL/hr at 11/06/20 2251     LOS: 1 day    Time spent: 25 minutes    Sidney Ace, MD [facility] Pager [phone]  If 7PM-7AM, please contact night-coverage 11/07/2020, 2:16 PM

## 2020-11-07 NOTE — Anesthesia Postprocedure Evaluation (Signed)
Anesthesia Post Note  Patient: Renee Mack  Procedure(s) Performed: ANTERIOR CERVICAL DECOMPRESSION/DISCECTOMY FUSION 2 LEVELS C5-C7 (N/A )  Patient location during evaluation: PACU Anesthesia Type: General Level of consciousness: awake and alert Pain management: pain level controlled Vital Signs Assessment: post-procedure vital signs reviewed and stable Respiratory status: spontaneous breathing, nonlabored ventilation, respiratory function stable and patient connected to nasal cannula oxygen Cardiovascular status: blood pressure returned to baseline and stable Postop Assessment: no apparent nausea or vomiting Anesthetic complications: no   No complications documented.   Last Vitals:  Vitals:   11/06/20 2155 11/06/20 2217  BP:  128/66  Pulse: (!) 110 (!) 111  Resp: 16 18  Temp:  36.6 C  SpO2: 98% 94%    Last Pain:  Vitals:   11/07/20 0100  TempSrc:   PainSc: Renee Mack

## 2020-11-07 NOTE — Consult Note (Addendum)
Cardiology Consultation:   Patient ID: Renee Mack MRN: [id number]; DOB: February 12, 1966  Admit date: 11/06/2020 Date of Consult: 11/07/2020  PCP:  Olin Hauser, DO   [facility] Providers Cardiologist:  [name], MD   {  Patient Profile:   Renee Mack is a 55 y.o. female with a hx of CAD status post MI and DES placement to LAD x3 in 12/2017, subsequent readmission for subacute stent thrombosis and repeat PCI x3 status post PTCA most recently in [id number], combined systolic and diastolic CHF, ischemic cardiomyopathy with EF 45 to 50%, former tobacco abuse, COPD, hyperlipidemia, GERD, diabetes type 2, pancreatitis who is being seen 11/07/2020 for the evaluation of chest pain at the request of Dr. Priscella Mann.  History of Present Illness:   Ms. Armand been seen in the past by Dr. Cindee Lame.  Patient had NSTEMI in July 2019 for which she underwent cath that showed LAD 100p, 42m, 80d, RCAp (non-dominant).  She required 3 DES in the LAD.  Her recovery was complicated by cardiogenic shock and hypotension requiring vasopressors.  Following the MI EF was reduced to 25 to 35% was provided with a LifeVest at discharge.  Patient was admitted again with chest pain in Jult 2019 and August 2019 each requiring PCI and stent placement related to ISR of the LAD.  In cath 01/2018.  EF improved to 45 to 50%.  Since that time she was seen multiple times in admissions for chest pain, work-up for ACS negative.  Her last cath was 06/15/2018 which showed widely patent LAD stent with no significant restenosis, stable 90% stenosis in the nondominant small RCA not different than previous, small OM 2 which was subtotally occluded proximally which was unchanged from cath in August, mildly reduced EF 45 to 50% with anterior wall hypokinesis, normal LVEDP.  Echo therapy was recommended, Ranexa was titrated up to 1000 mg twice daily.  Patient was last seen 10/26/2020 by televisit.  She was stable from a cardiac perspective.   She was being treated for vulvar cancer and [facility] with radiation therapy with possible surgery.  Plan for indefinite DAPT with Brilinta and aspirin, however okay to stop Brilinta 5 days prior to surgery.  Patient was lost to follow-up. Says she has been occupied with treating her vulvar cancer.  Patient presented [facility] ED 11/06/2020 for chest pain and left arm weakness. She said neck pain seemed to be worse than the chest pain. She was awakened tow night ago with chest and neck pain, radiated down left arm. It was a sharp pain, worse with a deep breath, movement, and exertion. She had SOB, nausea, and diaphoresis. Symptoms were similar to prior heart attack  In the ED blood pressure 147/90, respiratory rate 18, afebrile, 100% O2.  Labs showed potassium 4.1, CO2 21, creatinine 0.6, BUN 9, magnesium 1.7, albumin 3.5, BNP 52, WBC 6.7, hemoglobin 14.3. HS troponin 12>11.  COVID-negative.  EK showed NSR with no acute changes. MRI of the brain negative for acute process, CTA negative for aortic dissection.  MRI of the C-spine showed degenerative changes, moderate to severe stenosis C6-C7 with mild stenosis C5-6, progressed from prior MRI.  Patient was seen by neurosurgery and admitted.  She underwent fusion at that afternoon and was admitted.  On interview she said she still has chest and neck pain. She thinks it is coming from recent neck surgery, but cannot exclude cardiac pain. Says she has not been compliant with her cardiac medications. She takes Aspirin daily, but stopped  taking Brilinta about a year ago. Has not been taking statin.  Past Medical History:  Diagnosis Date  . Cancer ([place])    vulvular  . Cervical disc disease    a. 01/2018 MRI Cervical spine: Cervical spondylosis with multilevel disc and facet degeneration greatest at C5-6 and C6-7.  Moderate to sev R C5-6, mod L C5-6, and mod bilat C6-7 foraminal stenosis with multilevel mild foraminal stenosis.  Mild C5-6 and mod C6-7 canal stenosis.  C6-7 central cord impingement w/ cord flattening.  . Chronic combined systolic (congestive) and diastolic (congestive) heart failure ([place])    a. 12/2017 Echo: EF 30-35%, mid-apicalanteroseptal, ant, and apical AK. Gr1 DD. Mild conc LVH; b. 03/2018 Echo: EF 45-50%, antsept, ant HK. Mild MR. Nl LA size. Nl RV fxn.  Marland Kitchen COPD (chronic obstructive pulmonary disease) (HCC)    not on home oxygen  . Coronary artery disease    a. 12/2017 ACS/PCI: LAD 100p (2.25x26 Resolute Onyx DES), 80m (2.0x12 Resolute Onyx DES), 80d (2.0x15 Resolute Onyx DES), RCA 90p (non-dominant). EF 25-35%. Post-MI course complicated by CGS; b. [id number] NSTEMI/subacute thrombosis-->LAD 100 (PTCA + DES x 1); c. 01/2018 NSTEMI/PCI: LM min irregs, LAD 50-60p ISR/hazy (3.5x12 Resolute DES), 20m/d (underexpansion of prior stents-->PTCA). EF 45-50%.  Marland Kitchen GERD (gastroesophageal reflux disease)   . H/O degenerative disc disease   . Hypotension   . Ischemic cardiomyopathy    a. 12/2017 Echo: EF 30-35%; b. 03/2018 Echo: EF 45-50%.  . Myocardial infarction ([place])    a. 12/2017-->DES to LAD x 3.  . Pancreatitis   . Shingles   . Spinal stenosis   . Tobacco abuse   . Ulcer (traumatic) of oral mucosa   . Urinary incontinence   . VIN III (vulvar intraepithelial neoplasia III)     Past Surgical History:  Procedure Laterality Date  . ABDOMINAL HYSTERECTOMY     partial  . CORONARY BALLOON ANGIOPLASTY N/A 05/22/2018   Procedure: CORONARY BALLOON ANGIOPLASTY;  Surgeon: Wellington Hampshire, MD;  Location: [place] [facility];  Service: Cardiovascular;  Laterality: N/A;  . CORONARY STENT INTERVENTION N/A 12/26/2017   Procedure: CORONARY STENT INTERVENTION;  Surgeon: Wellington Hampshire, MD;  Location: [place] [facility];  Service: Cardiovascular;  Laterality: N/A;  . CORONARY STENT INTERVENTION N/A 02/08/2018   Procedure: CORONARY STENT INTERVENTION;  Surgeon: Nelva Bush, MD;  Location: [place] [facility];  Service: Cardiovascular;  Laterality: N/A;   . CORONARY/GRAFT ACUTE MI REVASCULARIZATION N/A 12/19/2017   Procedure: Coronary/Graft Acute MI Revascularization;  Surgeon: Wellington Hampshire, MD;  Location: [place] [facility];  Service: Cardiovascular;  Laterality: N/A;  . ECTOPIC PREGNANCY SURGERY Left   . INTRAVASCULAR ULTRASOUND/IVUS N/A 02/08/2018   Procedure: Intravascular Ultrasound/IVUS;  Surgeon: Nelva Bush, MD;  Location: [place] [facility];  Service: Cardiovascular;  Laterality: N/A;  . LEFT HEART CATH AND CORONARY ANGIOGRAPHY N/A 12/19/2017   Procedure: LEFT HEART CATH AND CORONARY ANGIOGRAPHY;  Surgeon: Wellington Hampshire, MD;  Location: [place] [facility];  Service: Cardiovascular;  Laterality: N/A;  . LEFT HEART CATH AND CORONARY ANGIOGRAPHY N/A 12/26/2017   Procedure: LEFT HEART CATH AND CORONARY ANGIOGRAPHY;  Surgeon: Wellington Hampshire, MD;  Location: [place] [facility];  Service: Cardiovascular;  Laterality: N/A;  . LEFT HEART CATH AND CORONARY ANGIOGRAPHY N/A 02/08/2018   Procedure: LEFT HEART CATH AND CORONARY ANGIOGRAPHY;  Surgeon: Nelva Bush, MD;  Location: [place] [facility];  Service: Cardiovascular;  Laterality: N/A;  . LEFT HEART CATH AND  CORONARY ANGIOGRAPHY N/A 05/22/2018   Procedure: LEFT HEART CATH AND CORONARY ANGIOGRAPHY;  Surgeon: Wellington Hampshire, MD;  Location: [place] [facility];  Service: Cardiovascular;  Laterality: N/A;  . LEFT HEART CATH AND CORONARY ANGIOGRAPHY N/A 06/15/2018   Procedure: LEFT HEART CATH AND CORONARY ANGIOGRAPHY;  Surgeon: Wellington Hampshire, MD;  Location: [place] [facility];  Service: Cardiovascular;  Laterality: N/A;  . LYMPHADENECTOMY    . SPLENECTOMY, PARTIAL    . vulvulasectomy       Home Medications:  Prior to Admission medications   Medication Sig Start Date End Date Taking? Authorizing Provider  albuterol (PROAIR HFA) 108 (90 Base) MCG/ACT inhaler Inhale 1 puff into the lungs every 6 (six) hours as needed for wheezing or shortness of breath.    Yes [provider]  aspirin 81 MG tablet Take 81 mg by mouth daily.   Yes [provider]  Blood Glucose Monitoring Suppl (GLUCOCOM BLOOD GLUCOSE MONITOR) DEVI 1 m. 11/15/17  Yes Karamalegos, Devonne Doughty, DO  clonazePAM (KLONOPIN) 1 MG tablet Take 1 mg by mouth 3 (three) times daily as needed. 03/19/20  Yes [provider]  esomeprazole (NEXIUM) 40 MG capsule Take 1 capsule (40 mg total) by mouth daily before breakfast. 09/22/20  Yes Karamalegos, Devonne Doughty, DO  famotidine (PEPCID) 20 MG tablet Take 1 tablet (20 mg total) by mouth 2 (two) times daily. 09/22/20  Yes Karamalegos, Devonne Doughty, DO  insulin isophane & regular human (NOVOLIN 70/30 FLEXPEN) (70-30) 100 UNIT/ML KwikPen Inject 18 Units into the skin 2 (two) times daily. 04/05/20  Yes Deatra James, MD  Lancet Devices (SIMPLE DIAGNOSTICS LANCING DEV) MISC AS DIRECTED 11/15/17  Yes Karamalegos, Devonne Doughty, DO  Lidocaine HCl 4 % GEL Apply topically. 02/05/20  Yes [provider]  mirtazapine (REMERON) 15 MG tablet Take 15 mg by mouth at bedtime. 07/07/18  Yes [provider]  nitroGLYCERIN (NITROSTAT) 0.4 MG SL tablet Place 1 tablet (0.4 mg total) under the tongue every 5 (five) minutes as needed for chest pain. 06/16/18  Yes Gouru, Aruna, MD  ondansetron (ZOFRAN-ODT) 4 MG disintegrating tablet Take 4 mg by mouth every 8 (eight) hours as needed. 11/27/19  Yes [provider]  oxyCODONE (OXYCONTIN) 80 mg 12 hr tablet Take 1 tablet by mouth every 8 (eight) hours.   Yes [provider]  oxycodone (ROXICODONE) 30 MG immediate release tablet Take 30 mg by mouth every 3 (three) hours as needed. 04/14/20  Yes [provider]  SSD 1 % cream SMARTSIG:1 Topical 4-5 Times Daily 01/25/20  Yes [provider]  sucralfate (CARAFATE) 1 g tablet Take 1 g by mouth 4 (four) times daily. 03/01/20  Yes [provider]  ticagrelor (BRILINTA) 90 MG TABS tablet Take 1 tablet (90 mg total) by mouth 2  (two) times daily. *NEEDS OFFICE VISIT FOR FURTHER REFILLS-PLEASE CALL [phone] TO SCHEDULE.* 05/14/19  Yes Theora Gianotti, NP  amitriptyline (ELAVIL) 25 MG tablet Take 1 tablet (25 mg total) by mouth at bedtime. Patient taking differently: Take 75 mg by mouth at bedtime. 04/05/20 05/05/20  Deatra James, MD  atorvastatin (LIPITOR) 80 MG tablet Take 1 tablet (80 mg total) by mouth daily at 6 PM. 03/05/19 03/30/20  Theora Gianotti, NP  methocarbamol (ROBAXIN) 500 MG tablet Take 1 tablet by mouth 4 (four) times daily as needed. 08/01/20   [provider]  ranolazine (RANEXA) 500 MG 12 hr tablet Take 1 tablet (500 mg  total) by mouth 2 (two) times daily. 04/05/20 05/05/20  Deatra James, MD    Inpatient Medications: Scheduled Meds: . amitriptyline  75 mg Oral QHS  . aspirin EC  81 mg Oral Daily  . atorvastatin  80 mg Oral q1800  . famotidine  20 mg Oral BID  . insulin aspart  0-5 Units Subcutaneous QHS  . insulin aspart  0-9 Units Subcutaneous TID WC  . insulin aspart protamine- aspart  12 Units Subcutaneous BID WC  . mirtazapine  15 mg Oral QHS  . oxyCODONE  80 mg Oral Q8H  . pantoprazole  40 mg Oral QAC breakfast  . ranolazine  500 mg Oral BID  . silver sulfADIAZINE   Topical Daily  . sucralfate  1 g Oral QID   Continuous Infusions: . sodium chloride 50 mL/hr at 11/06/20 2251   PRN Meds: acetaminophen, albuterol, clonazePAM, dextromethorphan-guaiFENesin, fentaNYL (SUBLIMAZE) injection, hydrALAZINE, methocarbamol, nitroGLYCERIN, ondansetron (ZOFRAN) IV, oxycodone  Allergies:    Allergies  Allergen Reactions  . Bee Venom Itching, Shortness Of Breath and Swelling  . Metformin And Related Shortness Of Breath and Swelling  . Darvon [Propoxyphene] Itching  . Gabapentin Swelling  . Nsaids Other (See Comments)    Ulcers   . Tramadol Hives  . Contrast Media [Iodinated Diagnostic Agents] Rash    If you benadryl, and steroids she is able to take  the contrast per pt    Social History:   Social History   Socioeconomic History  . Marital status: Widowed    Spouse name: Not on file  . Number of children: 4  . Years of education: Not on file  . Highest education level: Not on file  Occupational History  . Occupation: disabled  Tobacco Use  . Smoking status: Former Smoker    Packs/day: 0.50    Years: 30.00    Pack years: 15.00    Types: Cigarettes    Quit date: 12/19/2017    Years since quitting: 2.8  . Smokeless tobacco: Former Systems developer  . Tobacco comment: quit 12/19/2017.  Vaping Use  . Vaping Use: Never used  Substance and Sexual Activity  . Alcohol use: No  . Drug use: No  . Sexual activity: Not Currently  Other Topics Concern  . Not on file  Social History Narrative   Lives in [place] by herself.  Does not routinely exercise.   Social Determinants of Health   Financial Resource Strain: Low Risk   . Difficulty of Paying Living Expenses: Not hard at all  Food Insecurity: Not on file  Transportation Needs: Not on file  Physical Activity: Sufficiently Active  . Days of Exercise per Week: 7 days  . Minutes of Exercise per Session: 30 min  Stress: Not on file  Social Connections: Not on file  Intimate Partner Violence: Not on file    Family History:    Family History  Problem Relation Age of Onset  . Non-Hodgkin's lymphoma Mother   . Osteoporosis Mother   . Lupus Sister   . Heart disease Brother   . Heart attack Brother   . Heart attack Father      ROS:  Please see the history of present illness.   All other ROS reviewed and negative.     Physical Exam/Data:   Vitals:   11/06/20 2155 11/06/20 2217 11/07/20 0330 11/07/20 0721  BP:  128/66 125/77 122/67  Pulse: (!) 110 (!) 111 (!) 108 (!) 108  Resp: 16 18  17   Temp:  97.8 F (36.6 C) 98.6 F (37 C) 98.6 F (37 C)  TempSrc:  Oral Oral Axillary  SpO2: 98% 94%  94%  Weight:   58 kg   Height:        Intake/Output Summary (Last 24 hours) at  11/07/2020 0758 Last data filed at 11/06/2020 1928 Gross per 24 hour  Intake 750 ml  Output 20 ml  Net 730 ml   Last 3 Weights 11/07/2020 11/06/2020 11/06/2020  Weight (lbs) 127 lb 12.8 oz 130 lb 129 lb  Weight (kg) 57.97 kg 58.968 kg 58.514 kg     Body mass index is 24.15 kg/m.  General:  Well nourished, well developed, in no acute distress HEENT: normal Lymph: no adenopathy Neck: no JVD Endocrine:  No thryomegaly Vascular: No carotid bruits; FA pulses 2+ bilaterally without bruits  Cardiac:  normal S1, S2; RR, tachycardia; no murmur  Lungs:  clear to auscultation bilaterally, no wheezing, rhonchi or rales  Abd: soft, nontender, no hepatomegaly  Ext: no edema Musculoskeletal:  No deformities, BUE and BLE strength normal and equal Skin: warm and dry  Neuro:  CNs 2-12 intact, no focal abnormalities noted Psych:  Normal affect   EKG:  The EKG was personally reviewed and demonstrates:  NSR, anteroseptal 1 waves, old TWI aVL Telemetry:  Telemetry was personally reviewed and demonstrates:  ST< HR 100-120  Relevant CV Studies:  Echo ordered  Echo 03/2018 Study Conclusions   - Left ventricle: The cavity size was normal. Systolic function was  mildly reduced. The estimated ejection fraction was in the range  of 45% to 50%. Hypokinesis of the anteroseptal myocardium.  Hypokinesis of the anterior myocardium. Left ventricular  diastolic function parameters were normal.  - Mitral valve: There was mild regurgitation.  - Left atrium: The atrium was normal in size.  - Right ventricle: Systolic function was normal.  - Pulmonary arteries: Systolic pressure could not be accurately  estimated.   Impressions:   - Compared to previous study, EF has improved.   Cardiac cath 05/2018  Colon Flattery 2nd Diag to 2nd Diag lesion is 90% stenosed.  Non-stenotic Prox LAD lesion was previously treated.  Non-stenotic Mid LAD to Dist LAD lesion was previously treated.  Non-stenotic Mid LAD  lesion.  Balloon angioplasty was performed.  Prox Cx lesion is 30% stenosed.  Prox RCA lesion is 90% stenosed.  There is mild left ventricular systolic dysfunction.  LV end diastolic pressure is normal.  The left ventricular ejection fraction is 45-50% by visual estimate.  Ost 2nd Mrg to 2nd Mrg lesion is 100% stenosed.   1.  Widely patent LAD stent with no significant restenosis.  Stable 90% stenosis in the nondominant small right coronary artery not different from before.  Also there is a small OM2 which is subtotally occluded proximally.  This is also unchanged from recent cardiac catheterization and catheterization in August.  2.  Mildly reduced LV systolic function with an EF of 45 to 50% with anterior wall hypokinesis.  Normal left ventricular end-diastolic pressure.  Recommendations: Continue medical therapy. No clear culprit for the patient's symptoms other than known small vessel disease.    The patient can be discharged home from a cardiac standpoint.    Laboratory Data:  High Sensitivity Troponin:   Recent Labs  Lab 11/06/20 0629 11/06/20 0847 11/06/20 1350  TROPONINIHS 12 11 7      Chemistry Recent Labs  Lab 11/06/20 0629 11/07/20 0520  NA 137 135  K 4.1 4.1  CL 102  104  CO2 25 20*  GLUCOSE 281* 370*  BUN 9 12  CREATININE 0.60 0.51  CALCIUM 9.4 8.7*  GFRNONAA >60 >60  ANIONGAP 10 11    Recent Labs  Lab 11/06/20 0629  PROT 7.5  ALBUMIN 3.5  AST 13*  ALT 12  ALKPHOS 98  BILITOT 0.5   Hematology Recent Labs  Lab 11/06/20 0629 11/07/20 0520  WBC 6.7 12.7*  RBC 4.64 4.10  HGB 14.3 12.7  HCT 42.4 37.6  MCV 91.4 91.7  MCH 30.8 31.0  MCHC 33.7 33.8  RDW 13.1 13.1  PLT 298 278   BNP Recent Labs  Lab 11/06/20 0629  BNP 52.7    DDimer No results for input(s): DDIMER in the last 168 hours.   Radiology/Studies:  DG Cervical Spine 2-3 Views  Result Date: 11/06/2020 CLINICAL DATA:  Anterior cervical decompression EXAM: CERVICAL  SPINE - 2-3 VIEW COMPARISON:  MRI from earlier in the same day. FLUOROSCOPY TIME:  Radiation Exposure Index (as provided by the fluoroscopic device): 0.75 mGy If the device does not provide the exposure index: Fluoroscopy Time:  5 seconds Number of Acquired Images:  2 FINDINGS: Initial images show a surgical instrument at the anterior aspect of the C5-6 interspace. Subsequently anterior fixation plate from [id number] is noted. IMPRESSION: C5-C7 cervical fusion. Electronically Signed   By: Inez Catalina M.D.   On: 11/06/2020 20:24   CT Head Wo Contrast  Result Date: 11/06/2020 CLINICAL DATA:  Left arm weakness EXAM: CT HEAD WITHOUT CONTRAST TECHNIQUE: Contiguous axial images were obtained from the base of the skull through the vertex without intravenous contrast. COMPARISON:  None. FINDINGS: Brain: Tiny remote infarct within the right cerebellar hemisphere. Punctate lacunar infarct versus dilated perivascular space within the inferior right basal ganglia. No abnormal intra or extra-axial mass lesion or fluid collection. No abnormal mass effect or midline shift. No evidence of acute intracranial hemorrhage or infarct. Ventricular size is normal. Cerebellum unremarkable. Vascular: Unremarkable Skull: Intact Sinuses/Orbits: Mild mucosal thickening noted within the maxillary sinuses bilaterally as well as within several ethmoid air cells bilaterally. No air-fluid levels. Remaining paranasal sinuses are clear. Orbits are unremarkable. Other: Mastoid air cells and middle ear cavities are clear. IMPRESSION: No evidence of acute intracranial hemorrhage or infarct. Tiny remote right cerebellar and possible right basal ganglia infarcts. Electronically Signed   By: Fidela Salisbury MD   On: 11/06/2020 07:09   MR ANGIO HEAD WO CONTRAST  Result Date: 11/06/2020 CLINICAL DATA:  Neuro deficit, acute, stroke suspected. EXAM: MRA HEAD WITHOUT CONTRAST TECHNIQUE: Angiographic images of the Circle of Willis were acquired using MRA  technique without intravenous contrast. COMPARISON:  No pertinent prior exam. FINDINGS: The visualized portions of the distal cervical and intracranial internal carotid arteries are widely patent with normal flow related enhancement. The bilateral anterior cerebral arteries and middle cerebral arteries are widely patent with antegrade flow without high-grade flow-limiting stenosis or proximal branch occlusion. No intracranial aneurysm within the anterior circulation. The vertebral arteries are widely patent with antegrade flow. The posterior inferior cerebral arteries are normal. Vertebrobasilar junction and basilar artery are widely patent with antegrade flow without evidence of basilar stenosis or aneurysm. Posterior cerebral arteries are normal bilaterally. No intracranial aneurysm within the posterior circulation. IMPRESSION: No evidence of intracranial large vessel occlusion or hemodynamically significant stenosis. Electronically Signed   By: Pedro Earls M.D.   On: 11/06/2020 10:28   MR Angiogram Neck W or Wo Contrast  Result Date: 11/06/2020 CLINICAL DATA:  Neuro deficit, acute, stroke suspected. EXAM: MRA NECK WITHOUT AND WITH CONTRAST TECHNIQUE: Multiplanar and multiecho pulse sequences of the neck were obtained without and with intravenous contrast. Angiographic images of the neck were obtained using MRA technique without and with intravenous contrast. CONTRAST:  56mL GADAVIST GADOBUTROL 1 MMOL/ML IV SOLN COMPARISON:  CT angiogram of the chest Nov 06, 2020. FINDINGS: Aortic arch: Normal 3 vessel aortic branching pattern. The visualized subclavian arteries are normal. Mild luminal irregularity of the aortic arch correlating with atherosclerotic disease seen on prior CT angiogram of the chest. No significant stenosis or aneurysm. Right carotid system: Mild atherosclerotic changes at the right carotid bifurcation without hemodynamically significant stenosis. Left carotid system:  Atherosclerotic changes at of the left carotid bifurcation without hemodynamically significant stenosis. Vertebral arteries: Codominant. Vertebral artery origins are normal. Vertebral arteries are normal in course and caliber to the vertebrobasilar confluence without stenosis or evidence of dissection. IMPRESSION: 1. Mild atherosclerotic changes in the bilateral carotid bifurcation without hemodynamically significant stenosis. 2. Mild atherosclerotic changes in the aortic arch. Electronically Signed   By: Pedro Earls M.D.   On: 11/06/2020 10:40   MR BRAIN WO CONTRAST  Result Date: 11/06/2020 CLINICAL DATA:  Neuro deficit, acute, stroke suspected. EXAM: MRI HEAD WITHOUT CONTRAST TECHNIQUE: Multiplanar, multiecho pulse sequences of the brain and surrounding structures were obtained without intravenous contrast. COMPARISON:  Head CT Nov 06, 2020 FINDINGS: Brain: No acute infarction, hemorrhage, hydrocephalus, extra-axial collection or mass lesion. Vascular: Normal flow voids. Skull and upper cervical spine: Normal marrow signal. Sinuses/Orbits: Mucosal thickening throughout the paranasal sinuses, more pronounced in the ethmoid cells. The orbits are maintained. IMPRESSION: 1. Unremarkable MRI of the brain. 2. Inflammatory paranasal sinus disease. Electronically Signed   By: Pedro Earls M.D.   On: 11/06/2020 10:10   MR Cervical Spine Wo Contrast  Result Date: 11/06/2020 CLINICAL DATA:  Spinal stenosis. Cervical radiculopathy. Known malignancy. EXAM: MRI CERVICAL SPINE WITHOUT CONTRAST TECHNIQUE: Multiplanar, multisequence MR imaging of the cervical spine was performed. No intravenous contrast was administered. COMPARISON:  MRI of the cervical spine January 26, 2018. FINDINGS: Alignment: Physiologic. Vertebrae: No fracture, evidence of discitis, or bone lesion. Cord: Mass effect on the cord at C5-6 and C6-7, progressed from prior MRI, without cord signal abnormality. Posterior  Fossa, vertebral arteries, paraspinal tissues: Negative. Disc levels: C2-3: No spinal canal or neural foraminal stenosis. C3-4: Tiny posterior disc protrusion without significant spinal canal stenosis. Uncovertebral and facet degenerative changes resulting in mild left neural foraminal narrowing. C4-5: Posterior disc protrusion causing small indentation of the thecal sac without significant spinal canal stenosis. Uncovertebral and facet degenerative changes resulting in mild left neural foraminal narrowing. C5-6: Posterior disc protrusion asymmetric to the right causing mild mass effect on the anterior cord surface and resulting in mild spinal canal stenosis, mildly progressed from prior MRI. Uncovertebral and facet degenerative changes resulting moderate right and severe left neural foraminal narrowing. C6-7: Posterior disc protrusion mildly asymmetric to the left causing mass effect on the cord without cord signal abnormality and resulting in moderate to severe spinal canal stenosis, progressed from prior MRI. Uncovertebral and facet degenerative changes resulting moderate right and severe left neural foraminal narrowing. C7-T1: Facet degenerative changes. No spinal canal or neural foraminal stenosis. IMPRESSION: 1. Degenerative changes of the cervical spine, more pronounced at C6-7 where there is moderate to severe spinal canal stenosis with mass effect on the cord, progressed from prior MRI. No cord signal abnormality. 2. Mild spinal canal  stenosis at C5-6, mildly progressed from prior MRI. 3. Multilevel neural foraminal narrowing with moderate right and severe left neural foraminal narrowing at C5-6 and C6-7. Electronically Signed   By: Pedro Earls M.D.   On: 11/06/2020 10:23   DG C-Arm 1-60 Min-No Report  Result Date: 11/06/2020 Fluoroscopy was utilized by the requesting physician.  No radiographic interpretation.   CT Angio Chest/Abd/Pel for Dissection W and/or W/WO  Result Date:  11/06/2020 CLINICAL DATA:  Chest pain, left arm weakness EXAM: CT ANGIOGRAPHY CHEST, ABDOMEN AND PELVIS TECHNIQUE: Non-contrast CT of the chest was initially obtained. Multidetector CT imaging through the chest, abdomen and pelvis was performed using the standard protocol during bolus administration of intravenous contrast. Multiplanar reconstructed images and MIPs were obtained and reviewed to evaluate the vascular anatomy. CONTRAST:  51mL OMNIPAQUE IOHEXOL 350 MG/ML SOLN COMPARISON:  03/30/2020 FINDINGS: CTA CHEST FINDINGS Cardiovascular: The thoracic aorta is normal in caliber. No evidence of intramural hematoma, dissection, or aneurysm. There is moderate largely atheromatous plaque predominantly within the descending thoracic aorta demonstrating a corrugated morphology. The arch vasculature demonstrates normal anatomic configuration and is widely patent proximally. Mild coronary artery calcification. Long segment left anterior descending coronary artery stenting has been performed. Global cardiac size is within normal limits. No pericardial effusion. The central pulmonary arteries are of normal caliber. Mediastinum/Nodes: The visualized thyroid is unremarkable. No pathologic thoracic adenopathy. The esophagus is unremarkable. Lungs/Pleura: Moderate centrilobular emphysema. No focal pulmonary nodules or infiltrates. Previously noted pulmonary infiltrates have resolved, in keeping with acute infection or inflammation on the prior examination. No pneumothorax or pleural effusion. Central airways are widely patent. There is mild central bronchial wall thickening again noted in keeping with chronic airway inflammation. Musculoskeletal: No acute bone abnormality. No lytic or blastic bone lesions identified. Review of the MIP images confirms the above findings. CTA ABDOMEN AND PELVIS FINDINGS VASCULAR Aorta: Normal caliber. No evidence of aneurysm or dissection. Extensive atheromatous plaque identified with moderate  fissuring resulting in a corrugated pattern. No periaortic inflammatory change identified. Celiac: Less than 50% stenosis of the origin. Normal anatomic configuration. Distally widely patent. No evidence of aneurysm or dissection. SMA: Less than 50% stenosis at its origin. Distally widely patent. No aneurysm or dissection. Renals: Single renal arteries bilaterally. 50% stenosis of the right renal artery at its origin secondary to atherosclerotic plaque. Less than 50% stenosis of the left renal artery at its origin. Normal vascular morphology. No aneurysm. IMA: 50% stenosis at the origin.  Distally widely patent. Inflow: Moderate mixed atherosclerotic plaque within the right common iliac artery without evidence of hemodynamically significant stenosis. Left common iliac artery is widely patent. No evidence of aneurysm or dissection. Internal iliac arteries are patent at their origins bilaterally. Veins: No obvious venous abnormality within the limitations of this arterial phase study. Review of the MIP images confirms the above findings. NON-VASCULAR Hepatobiliary: The liver is unremarkable. The gallbladder is unremarkable. Similar to that noted on prior CT examination of 10/10/2020, the extrahepatic bile duct is dilated measuring up to 1 cm in greatest dimension, new since remote prior examination of 03/30/2020. As noted previously, a distal obstructing lesion is not clearly identified on this examination. Pancreas: Unremarkable Spleen: Unremarkable Adrenals/Urinary Tract: Adrenal glands are unremarkable. Kidneys are normal, without renal calculi, focal lesion, or hydronephrosis. Bladder is unremarkable. Stomach/Bowel: There is moderate stool throughout the colon. The stomach, small bowel, and large bowel are otherwise unremarkable. Appendix normal. No free intraperitoneal gas or fluid. Lymphatic: No pathologic adenopathy  within the abdomen and pelvis. Reproductive: There is extensive soft tissue thickening at the  introitus and involving the medial aspects of the vulva bilaterally, similar to prior examination. Inflammatory changes extend posteriorly to involve the anus. There is asymmetric thickening of the right wall of the rectum, best appreciated on axial image # 195/5, more prominent than that seen on prior examination. Other: Tiny fat containing umbilical hernia noted. Musculoskeletal: No acute bone abnormality. No lytic or blastic bone lesions. Review of the MIP images confirms the above findings. IMPRESSION: No evidence of a thoracoabdominal aortic aneurysm or dissection. Moderate largely atheromatous plaque demonstrating extensive fissuring within the a descending thoracic and abdominal aorta. Clinical correlation for evidence of embolization (blue toe syndrome, livedo reticularis) may be helpful for further management. 50% stenosis of the right renal artery at its origin. 50% stenosis of the inferior mesenteric artery at its origin. The wide patency of the celiac axis and superior mesenteric artery however, would not support the diagnosis of chronic mesenteric ischemia. Mild coronary artery calcification with long segment stenting of the left anterior descending coronary artery. Moderate centrilobular emphysema. Persistent dilation of the extrahepatic bile duct without obvious cause identified. Correlation with liver enzymes would be helpful to assess for an obstructing process. If indicated, ERCP or MRCP examination may be more helpful for further evaluation. Moderate stool throughout the colon. Asymmetric thickening of the a distal rectum may result in some degree of obstruction and resultant fecal retention. Soft tissue thickening appears confluent with that seen within the introitus and perineum noted on multiple prior examinations. Aortic Atherosclerosis (ICD10-I70.0) and Emphysema (ICD10-J43.9). Electronically Signed   By: Fidela Salisbury MD   On: 11/06/2020 07:38     Assessment and Plan:   Chest  pain H/o CAD with prior LAD placement with ISR in 2019 - presented with chest pain and right arm weakness, found to have worsening C5-C7 stenosis and underwent fusion by neurosurgery.  - HS troponin negative. EKG with no new changes - Chest pain difficult to determine whether it is from her neck vs cardiac pain, it has typical and atypical features. - last cath was 2019 showing widely patent LAD stent with no significant stenosis, 90% stenosis nondominant small RCA, small OM2 which is subtotally occluded, unchanged since August 2019, EF 45-50%, normal LVEDP - H/o of multiple admission for chest pain with negative work-up - PTA ASA and Brilinta, although she admits to not taking Brilinta for at least a year. Plan was for indefinite DAPT. Brilinta held for surgery, resume when safe per surgeon - Also was not on a statin, re-start - no BB due to severe hypotension in the past, consider low dose BB - Previously on Ranexa -  Echo performed - Given medications non-compliance and symptoms an ischemic evaluation is not unreasonable, but gien negative troponin and unchanged EKG can possible be as OP.  Ischemic CM with improved EF 45-50%  HFrEF - EF as low as 30-35% in 12/2017 in the setting of NSTEMI - improved by cath 05/2018, has not been seen since 2020 - No CHF meds PTA due to severe hypotension - re-check echo, further plan pending echo  HTN - h/o of hypotension, now appear wnl - trial low dose BB  HLD - LDL 106 10/2020 - goal <70 - re-start atorvastatin 80mg  daily  DM2 - A1C 10.4 - per IM  C-spine stenosis s/p fusion - per nuerosurgery   For questions or updates, please contact [facility] Please consult [email or web address] for contact info  under    Signed, Daena Alper Ninfa Meeker, PA-C  11/07/2020 7:58 AM

## 2020-11-07 NOTE — Progress Notes (Signed)
Inpatient Diabetes Program Recommendations  AACE/ADA: New Consensus Statement on Inpatient Glycemic Control (2015)  Target Ranges:  Prepandial:   less than 140 mg/dL      Peak postprandial:   less than 180 mg/dL (1-2 hours)      Critically ill patients:  140 - 180 mg/dL   Lab Results  Component Value Date   GLUCAP 316 (H) 11/07/2020   HGBA1C 11.1 (H) 11/06/2020    Review of Glycemic Control Results for Renee Mack, Renee Mack (MRN [id number]) as of 11/07/2020 10:57  Ref. Range 11/06/2020 16:43 11/06/2020 20:48 11/07/2020 07:22  Glucose-Capillary Latest Ref Range: 70 - 99 mg/dL 308 (H) 261 (H) 316 (H)   Diabetes history: DM 2 Outpatient Diabetes medications:  Novolin 70/30 18 units bid Current orders for Inpatient glycemic control:  Novolog sensitive tid with meals and HS Novolog 70/30 mix 12 units bid Inpatient Diabetes Program Recommendations:   Agree with current orders.  A1C=11.1%-  Will discuss with patient today.    Thanks,  Adah Perl, RN, BC-ADM Inpatient Diabetes Coordinator Pager [phone] (8a-5p)

## 2020-11-07 NOTE — Progress Notes (Addendum)
Inpatient Diabetes Program Recommendations  AACE/ADA: New Consensus Statement on Inpatient Glycemic Control (2015)  Target Ranges:  Prepandial:   less than 140 mg/dL      Peak postprandial:   less than 180 mg/dL (1-2 hours)      Critically ill patients:  140 - 180 mg/dL   Lab Results  Component Value Date   GLUCAP 393 (H) 11/07/2020   HGBA1C 11.1 (H) 11/06/2020    Review of Glycemic Control  Inpatient Diabetes Program Recommendations:   Attempted to call patient's room phone (DM coordinator working remotely from [employer]), cell phone and daughters cell phone without success. Ordered Living Well with Diabetes book for patient along with exit notes attached on nutrition and sick day rules. Nurses, please continue ongoing review with patient @ bedside.  Thank you, Nani Gasser. Paidyn Mcferran, RN, MSN, CDE  Diabetes Coordinator Inpatient Glycemic Control Team Team Pager [phone] (8am-5pm) 11/07/2020 2:24 PM

## 2020-11-07 NOTE — Progress Notes (Signed)
    Attending Progress Note  History: Shaquela Weichert is here for progressive weakness and cervical myeloradiculopathy.  11/06/20 - ACDF C5-7  POD1: C/o neck pain.  L arm is feeling stronger.  No issues swallowing.  POD0: Doing well in PACU.  Feels left arm more than prior to surgery.  Physical Exam: Vitals:   11/07/20 0330 11/07/20 0721  BP: 125/77 122/67  Pulse: (!) 108 (!) 108  Resp:  17  Temp: 98.6 F (37 C) 98.6 F (37 C)  SpO2:  94%    AA Ox3 CNI  Strength:5/5 throughout RUE LUE with 4-/5 biceps, deltoids, and triceps. L wrist drop 2/5, finger drop 2/5 Incision c/d/i  Data:  Recent Labs  Lab 11/06/20 0629 11/07/20 0520  NA 137 135  K 4.1 4.1  CL 102 104  CO2 25 20*  BUN 9 12  CREATININE 0.60 0.51  GLUCOSE 281* 370*  CALCIUM 9.4 8.7*   Recent Labs  Lab 11/06/20 0629  AST 13*  ALT 12  ALKPHOS 98     Recent Labs  Lab 11/06/20 0629 11/07/20 0520  WBC 6.7 12.7*  HGB 14.3 12.7  HCT 42.4 37.6  PLT 298 278   Recent Labs  Lab 11/06/20 0629  APTT 27  INR 1.0         Other tests/results: xrays pending  Assessment/Plan:  Melah Ebling is stable from ACDF.  - mobilize - pain control - DVT prophylaxis - PTOT - check xrays today - Other care per primary team.   Meade Maw MD, [facility] Department of Neurosurgery

## 2020-11-07 NOTE — Evaluation (Signed)
Physical Therapy Evaluation Patient Details Name: Renee Mack MRN: [id number] DOB: Jun 10, 1966 Today's Date: 11/07/2020   History of Present Illness  presented to ER secondary to chest pain, L UE weakness/numbness; admitted for management of spinal stenosis of cervical region, s/p emergent C5-7 ACDF (11/06/20). Of note, currently undergoing radiation therapy for vulvular cancer.  Clinical Impression  Patient resting in bed upon arrival to room; alert and oriented, follows commands and agreeable to participation with session.  Rates pain in cervical area 8/10; meds requested per RN.  L UE > LE with moderate weakness and impaired motor control/coordination; significant difficulty with functional strength/use of L wrist and hand.  Endorses moderate paresthesia L UE mid forearm distally and L LE mid-calf distally.  Does feel both strength, ROM and sensory awareness of both L UE/LE have improved since surgery.  Currently requiring mod assist for bed mobility; min/mod assist for sit/stand, basic transfers and short-distance gait (5') with RW.  Demonstrates shuffling steps, limited step height/length; limited dynamic balance and overall activity tolerance.  Requires use of RW and +1 at all times.  Increased time/effort for L UE placement/grasp on RW Would benefit from skilled PT to address above deficits and promote optimal return to PLOF.; recommend transition to acute inpatient rehab upon discharge for high-intensity, post-acute rehab services.      Follow Up Recommendations CIR    Equipment Recommendations       Recommendations for Other Services       Precautions / Restrictions Precautions Precautions: Fall;Cervical Restrictions Weight Bearing Restrictions: No      Mobility  Bed Mobility Overal bed mobility: Needs Assistance Bed Mobility: Supine to Sit     Supine to sit: Mod assist     General bed mobility comments: step by step cuing for log rolling; extensive assist for truncal  elevation    Transfers Overall transfer level: Needs assistance Equipment used: Rolling walker (2 wheeled) Transfers: Sit to/from Stand Sit to Stand: Min assist;Mod assist         General transfer comment: cuing for hand placement, assist for L UE placement/grasp on RW.  Ambulation/Gait Ambulation/Gait assistance: Min assist;Mod assist Gait Distance (Feet): 5 Feet Assistive device: Rolling walker (2 wheeled)       General Gait Details: shuffling steps, limited step height/length; limited dynamic balance and overall activity tolerance.  Requires use of RW and +1 at all times.  Increased time/effort for L UE placement/grasp on RW  Stairs            Wheelchair Mobility    Modified Rankin (Stroke Patients Only)       Balance Overall balance assessment: Needs assistance Sitting-balance support: No upper extremity supported;Feet supported Sitting balance-Leahy Scale: Fair     Standing balance support: Bilateral upper extremity supported Standing balance-Leahy Scale: Poor                               Pertinent Vitals/Pain Pain Assessment: 0-10 Pain Score: 8  Pain Location: cervical area Pain Descriptors / Indicators: Aching;Guarding;Grimacing Pain Intervention(s): Limited activity within patient's tolerance;Monitored during session;Repositioned;Patient requesting pain meds-RN notified    Home Living Family/patient expects to be discharged to:: Private residence Living Arrangements: Alone Available Help at Discharge: Family;Available PRN/intermittently Type of Home: Apartment Home Access: Level entry     Home Layout: One level        Prior Function Level of Independence: Independent         Comments:  Mod indep with 4WRW for ADLs, household and community mobilization; + driving; no home O2.  Denies fall history.     Hand Dominance        Extremity/Trunk Assessment   Upper Extremity Assessment Upper Extremity Assessment:  (L UE  grossly 2+ to 3-/5 throughout L shoulder and elbow, 0 to 1-/5 throughout L wrist and hand.  General paresthesia L fingers, hand and forearm.  Generalized tenodesis movement/grasp of L hand.  R UE grossly 4-/5 throughout.)    Lower Extremity Assessment Lower Extremity Assessment:  (L hip and knee grossly 3-/5, ankle 2+/5, general paresthesia L foot.  R LE grossly 4-/5)       Communication   Communication: No difficulties  Cognition Arousal/Alertness: Awake/alert Behavior During Therapy: WFL for tasks assessed/performed Overall Cognitive Status: Within Functional Limits for tasks assessed                                        General Comments      Exercises Other Exercises Other Exercises: Educated in role of PT and progressive mobility, cervical precautions, log rolling technique; patient voiced understanding and awareness.  Will continue to reinforce as needed.   Assessment/Plan    PT Assessment Patient needs continued PT services  PT Problem List Decreased strength;Decreased range of motion;Decreased activity tolerance;Decreased balance;Decreased mobility;Decreased coordination;Decreased knowledge of use of DME;Decreased safety awareness;Decreased knowledge of precautions;Cardiopulmonary status limiting activity;Decreased skin integrity;Pain       PT Treatment Interventions DME instruction;Gait training;Stair training;Functional mobility training;Therapeutic activities;Patient/family education;Cognitive remediation;Balance training;Therapeutic exercise    PT Goals (Current goals can be found in the Care Plan section)  Acute Rehab PT Goals Patient Stated Goal: to go back home PT Goal Formulation: With patient Time For Goal Achievement: 11/21/20 Potential to Achieve Goals: Fair    Frequency 7X/week   Barriers to discharge        Co-evaluation               AM-PAC PT "6 Clicks" Mobility  Outcome Measure Help needed turning from your back to your  side while in a flat bed without using bedrails?: A Little Help needed moving from lying on your back to sitting on the side of a flat bed without using bedrails?: A Lot Help needed moving to and from a bed to a chair (including a wheelchair)?: A Lot Help needed standing up from a chair using your arms (e.g., wheelchair or bedside chair)?: A Lot Help needed to walk in hospital room?: A Lot Help needed climbing 3-5 steps with a railing? : A Lot 6 Click Score: 13    End of Session Equipment Utilized During Treatment: Gait belt Activity Tolerance: Patient tolerated treatment well Patient left: in chair;with call bell/phone within reach;with chair alarm set Nurse Communication: Mobility status PT Visit Diagnosis: Muscle weakness (generalized) (M62.81);Difficulty in walking, not elsewhere classified (R26.2)    Time: 2951-8841 PT Time Calculation (min) (ACUTE ONLY): 35 min   Charges:   PT Evaluation $PT Eval Moderate Complexity: 1 Mod PT Treatments $Therapeutic Activity: 8-22 mins        Baylin Gamblin H. Owens Shark, PT, DPT, NCS 11/07/20, 4:45 PM [phone]

## 2020-11-07 NOTE — Evaluation (Signed)
Occupational Therapy Evaluation Patient Details Name: Renee Mack MRN: [id number] DOB: 1966-01-17 Today's Date: 11/07/2020    History of Present Illness presented to ER secondary to chest pain, L UE weakness/numbness; admitted for management of spinal stenosis of cervical region, s/p emergent C5-7 ACDF (11/06/20). Of note, currently undergoing radiation therapy for vulvular cancer.   Clinical Impression   Pt seen for OT evaluation this date. Pt received in recliner, sleeping lightly, wakes easily, and endorses significant cervical area pain. RN notified. Pt A&Ox4, follows all commands, and agreeable to limited OT evaluation 2/2 pain (RN reports she will need to check BP again prior to pain medicine). Pt presents with moderate L UE > LE weakness and impaired motor control/coordination; significant difficulty with functional strength/use of L wrist and hand.  Endorses moderate paresthesia L UE mid forearm distally and L LE mid-calf distally. Pain to light touch to proximal>distal LUE, "pins and needles." Does feel both strength, ROM and sensory awareness of both L UE/LE have improved since surgery. Pt currently requiring at least MOD A for bathing, dressing, and toileting tasks, ADL mobility, MIN A for grooming tasks, and set up for one handed self feeding tasks using dominant RUE. ADL mobility deferred 2/2 pain. Recliner positioning adjusted to support comfort. Pt appreciative. LUE repositioned on pillow for optimal neutral positioning and joint protection. Pt would benefit from high intensity, skilled OT to address above deficits and promote optimal return to PLOF. Recommend transition to acute inpatient rehab upon discharge for high-intensity, post-acute rehab services.    Follow Up Recommendations  CIR    Equipment Recommendations  3 in 1 bedside commode    Recommendations for Other Services       Precautions / Restrictions Precautions Precautions: Fall;Cervical Restrictions Weight Bearing  Restrictions: No      Mobility Bed Mobility     General bed mobility comments: NT, up in recliner    Transfers         General transfer comment: held 2/2 significant cervical pain    Balance                             ADL either performed or assessed with clinical judgement   ADL Overall ADL's : Needs assistance/impaired                                       General ADL Comments: Pt currently requires MOD A for LB ADL and UB ADL tasks 2/2 LUE and LLE impairments and pain. ADL transfers deferred 2/2 pain, anticipate need for MOD A for ADL transfers based on PT session. Unable to perform bilat ADL tasks 2/2 LUE impairments. Requires set up for grooming and self feeding using RUE to perform and requiring MIN A for thoroughness for coming hair and applying deodorant.     Vision         Perception     Praxis      Pertinent Vitals/Pain Pain Assessment: 0-10 Pain Score: 7  Pain Location: cervical area Pain Descriptors / Indicators: Aching;Guarding;Grimacing Pain Intervention(s): Limited activity within patient's tolerance;Monitored during session;Repositioned;Patient requesting pain meds-RN notified     Hand Dominance Right   Extremity/Trunk Assessment Upper Extremity Assessment Upper Extremity Assessment: LUE deficits/detail;RUE deficits/detail RUE Deficits / Details: grossly 4-/5 LUE Deficits / Details: L UE grossly 2+ to 3-/5 throughout L shoulder and elbow, 0 to  1-/5 throughout L wrist, 1 to 2-/5 with composite finger flexion. General paresthesia L fingers (ulnar innervation worse than medial), hand and forearm. Generalized tenodesis movement/grasp of L hand. "pins and needles", increased pain/sensory response to light touch to proximal LUE/L shoulder LUE: Unable to fully assess due to pain LUE Sensation: decreased light touch LUE Coordination: decreased fine motor;decreased gross motor   Lower Extremity Assessment Lower Extremity  Assessment: Defer to PT evaluation (L hip and knee grossly 3-/5, ankle 2+/5, general paresthesia L foot. R LE grossly 4-/5)   Cervical / Trunk Assessment Cervical / Trunk Assessment: Other exceptions Cervical / Trunk Exceptions: s/p anterior cervical surgery   Communication Communication Communication: No difficulties   Cognition Arousal/Alertness: Awake/alert Behavior During Therapy: WFL for tasks assessed/performed Overall Cognitive Status: Within Functional Limits for tasks assessed                                     General Comments       Exercises Other Exercises Other Exercises: Pt educated in role of OT, cervical precautions, LUE positioning and joint protection. Pt verbalized understanding and will benefit from additional instruction to support recall and carryover.   Shoulder Instructions      Home Living Family/patient expects to be discharged to:: Private residence Living Arrangements: Alone Available Help at Discharge: Family;Available PRN/intermittently Type of Home: Apartment Home Access: Level entry     Home Layout: One level     Bathroom Shower/Tub: Teacher, early years/pre: Standard     Home Equipment: Walker - 4 wheels          Prior Functioning/Environment Level of Independence: Independent        Comments: Mod indep with [employer] for ADLs, household and community mobilization; + driving; no home O2.  Denies fall history.        OT Problem List: Decreased strength;Decreased coordination;Pain;Decreased range of motion;Impaired sensation;Impaired balance (sitting and/or standing);Decreased knowledge of use of DME or AE;Impaired UE functional use      OT Treatment/Interventions: Self-care/ADL training;Therapeutic exercise;Therapeutic activities;Neuromuscular education;Patient/family education;DME and/or AE instruction;Balance training    OT Goals(Current goals can be found in the care plan section) Acute Rehab OT  Goals Patient Stated Goal: to go back home OT Goal Formulation: With patient Time For Goal Achievement: 11/21/20 Potential to Achieve Goals: Good ADL Goals Pt Will Perform Upper Body Dressing: sitting;with modified independence Pt Will Perform Lower Body Dressing: sit to/from stand;with supervision Pt Will Transfer to Toilet: ambulating;with supervision (elevated commode) Pt/caregiver will Perform Home Exercise Program: Increased strength;Left upper extremity;With written HEP provided;With minimal assist Additional ADL Goal #1: Pt will demonstrate independence with LUE positioning when at rest to ensure joint and skin integrity as well as ensure optimal neutral positioning.  OT Frequency: Min 3X/week   Barriers to D/C:            Co-evaluation              AM-PAC OT "6 Clicks" Daily Activity     Outcome Measure Help from another person eating meals?: A Little Help from another person taking care of personal grooming?: A Little Help from another person toileting, which includes using toliet, bedpan, or urinal?: A Lot Help from another person bathing (including washing, rinsing, drying)?: A Lot Help from another person to put on and taking off regular upper body clothing?: A Lot Help from another person to put on and  taking off regular lower body clothing?: A Lot 6 Click Score: 14   End of Session Nurse Communication: Patient requests pain meds  Activity Tolerance: Patient limited by pain Patient left: in chair;with call bell/phone within reach;with chair alarm set  OT Visit Diagnosis: Other abnormalities of gait and mobility (R26.89);Muscle weakness (generalized) (M62.81);Pain;Hemiplegia and hemiparesis Hemiplegia - Right/Left: Left Hemiplegia - dominant/non-dominant: Non-Dominant Hemiplegia - caused by: Unspecified Pain - Right/Left: Left Pain - part of body: Shoulder;Arm (and cervical area)                Time: 4166-0630 OT Time Calculation (min): 15 min Charges:  OT  General Charges $OT Visit: 1 Visit OT Evaluation $OT Eval Moderate Complexity: 1 Mod  Hanley Hays, MPH, MS, OTR/L ascom [phone] 11/07/20, 5:05 PM

## 2020-11-08 DIAGNOSIS — M4802 Spinal stenosis, cervical region: Secondary | ICD-10-CM | POA: Diagnosis not present

## 2020-11-08 LAB — GLUCOSE, CAPILLARY
Glucose-Capillary: 209 mg/dL — ABNORMAL HIGH (ref 70–99)
Glucose-Capillary: 135 mg/dL — ABNORMAL HIGH (ref 70–99)
Glucose-Capillary: 147 mg/dL — ABNORMAL HIGH (ref 70–99)
Glucose-Capillary: 75 mg/dL (ref 70–99)

## 2020-11-08 MED ORDER — HYDROMORPHONE HCL 1 MG/ML IJ SOLN
1.0000 mg | INTRAMUSCULAR | Status: DC | PRN
  Administered 2020-11-08 – 2020-11-09 (×6): 1 mg via INTRAVENOUS
  Filled 2020-11-08 (×7): qty 1

## 2020-11-08 NOTE — Progress Notes (Signed)
PROGRESS NOTE    Renee Mack  GUY:[id number] DOB: [birth year] DOA: 11/06/2020 PCP: Olin Hauser, DO   Brief Narrative:  55 y.o. female with medical history significant of HLD, DM, CAD with DES, sCHF with EF 45-50%, vulvar CA (s/p of surgery and radiation therapy), COPD, GERD, depression with anxiety, former smoker, pancreatitis, DKA, who presents with chest pain and left arm weakness.  Pt states that her chest pain started at about 4:00 AM today. Her chest pain is located in the upper chest close to the neck, 8 out of 10 severity, sharp, radiating to the left arm.  Patient has some mild shortness of breath.  Patient has mild cough, no fever or chills. Patient states that she coughed up a little bright red blood, just one episode.  Patient also reports left arm weakness and left toe numbness.  He has some left upper arm pain.   Found on MRI imaging survey to have degenerative changes of cervical spine most pronounced at C6-7.  Moderate severe spinal canal stenosis with mass-effect on the cord which is progressed from prior MRI.  Patient was emergently taken to the operating room with neurosurgery on 5/19 for cervical discectomy and fusion.  Tolerated procedure well.  Postop day 1 evaluated by neurosurgery.  Felt to be stable postoperatively.    Assessment & Plan:   Principal Problem:   Spinal stenosis of cervical region Active Problems:   GERD (gastroesophageal reflux disease)   Hyperlipidemia LDL goal <70   Chest pain   Chronic systolic heart failure (HCC)   CAD (coronary artery disease)   Anxiety associated with depression   Vulvar cancer (HCC)   Diabetes mellitus without complication (HCC)   Hemoptysis   Dilated bile duct  Spinal stenosis of cervical region  Patient's left arm weakness is likely due to cervical spine stenosis.  MRI of C spin showed degenerative disc disease C5-C7, with mass-effect on spinal cord.   Dr. Izora Ribas of neurosurgery is consulted Status  post cervical discectomy with fusion 5/19 Moderate-severe post operative pain Plan: Continue SQ lovenox PT and OT evaluations, recommend CIR TOC and CIR consult As needed pain control  GERD (gastroesophageal reflux disease) Continue home Protonix and Pepcid  Hyperlipidemia LDL goal <70 Continue home Lipitor  Chest pain and history of CAD: S/p of DES 2019.  Troponin 12, 11.   Her chest pain is close to the front neck, may be related to the spinal cord stenosis Not indicative of ACS Plan: Cardiology consulted, recommendations appreciated Continue aspirin 81 daily Hold Brilinta postoperatively for only 7 days  Chronic systolic heart failure ([place])  2D echo on 03/25/2018 showed EF of 45-50%.   Patient does not have leg edema or JVD.   CHF seem to be compensated. -Check BNP --> 58.7  Anxiety associated with depression -Continue home medications  Diabetes mellitus without complication (HCC)  Recent A1c 10.4, poorly controlled.   Patient still taking 70/30 insulin. Improved control over interval Plan:. Continue 70/30 insulin.  12 units twice daily Sensitive sliding scale before meals and at bedtime Diabetes coordinator consult Carb modified diet  Hemoptysis CT angiograms negative for aortic dissection or central PE -Hold Brilinta -Continue aspirin  Dilated bile duct  CT angiogram incidentally showed extrahepatic bile duct dilation.   No GI symptoms.  Liver function normal. -Monitoring for any GI symptoms  Vulvar cancer  s/p of surgery and XRT. -pt is high dose pain med,  OxyContin 80 mg twice daily and as needed oxycodone 30 mg every  8 hours   DVT prophylaxis: SQ Lovenox  code Status: Full Family Communication: None today.  Offered to call but patient declined Disposition Plan: Status is: Inpatient  Remains inpatient appropriate because:Inpatient level of care appropriate due to severity of illness   Dispo: The patient is from: Home               Anticipated d/c is to: Home              Patient currently is not medically stable to d/c.   Difficult to place patient No  Postoperative day #2 status post anterior cervical discectomy and fusion.  Postoperative pain control.  Diabetes management.  CIR recommended by therapy.  TOC consult in place.     Level of care: Med-Surg  Consultants:   Neurosurgery  Cardiology  Procedures:  Anterior cervical discectomy and fusion, 5/19  Antimicrobials:   None   Subjective: Patient seen and examined.  Resting in bed.  Pain rated as moderate to severe  Objective: Vitals:   11/07/20 2332 11/08/20 0354 11/08/20 0725 11/08/20 0749  BP: 119/70 138/78 (!) 141/76 139/76  Pulse: 83 93 (!) 102 93  Resp: 17 17 14    Temp: 97.9 F (36.6 C) 98.2 F (36.8 C) 97.9 F (36.6 C)   TempSrc:   Oral   SpO2: 95% 96% 94%   Weight:      Height:        Intake/Output Summary (Last 24 hours) at 11/08/2020 1024 Last data filed at 11/08/2020 0407 Gross per 24 hour  Intake --  Output 325 ml  Net -325 ml   Filed Weights   11/06/20 0621 11/06/20 1702 11/07/20 0330  Weight: 58.5 kg 59 kg 58 kg    Examination:  General exam: Distress due to pain Respiratory system: Poor respiratory effort.  Lungs clear.  Room air Cardiovascular system: S1 & S2 heard, RRR. No JVD, murmurs, rubs, gallops or clicks. No pedal edema. Gastrointestinal system: Abdomen is nondistended, soft and nontender. No organomegaly or masses felt. Normal bowel sounds heard. Central nervous system: Alert and oriented. No focal neurological deficits. Extremities: Decreased power left upper extremity Skin: No rashes, lesions or ulcers Psychiatry: Judgement and insight appear normal. Mood & affect appropriate.     Data Reviewed: I have personally reviewed following labs and imaging studies  CBC: Recent Labs  Lab 11/06/20 0629 11/07/20 0520  WBC 6.7 12.7*  NEUTROABS 4.0  --   HGB 14.3 12.7  HCT 42.4 37.6  MCV 91.4 91.7   PLT 298 0000000   Basic Metabolic Panel: Recent Labs  Lab 11/06/20 0629 11/07/20 0520  NA 137 135  K 4.1 4.1  CL 102 104  CO2 25 20*  GLUCOSE 281* 370*  BUN 9 12  CREATININE 0.60 0.51  CALCIUM 9.4 8.7*  MG 1.7  --    GFR: Estimated Creatinine Clearance: 64.3 mL/min (by C-G formula based on SCr of 0.51 mg/dL). Liver Function Tests: Recent Labs  Lab 11/06/20 0629  AST 13*  ALT 12  ALKPHOS 98  BILITOT 0.5  PROT 7.5  ALBUMIN 3.5   No results for input(s): LIPASE, AMYLASE in the last 168 hours. No results for input(s): AMMONIA in the last 168 hours. Coagulation Profile: Recent Labs  Lab 11/06/20 0629  INR 1.0   Cardiac Enzymes: No results for input(s): CKTOTAL, CKMB, CKMBINDEX, TROPONINI in the last 168 hours. BNP (last 3 results) No results for input(s): PROBNP in the last 8760 hours. HbA1C: Recent Labs  11/06/20 1350  HGBA1C 11.1*   CBG: Recent Labs  Lab 11/07/20 0722 11/07/20 1132 11/07/20 1602 11/07/20 2128 11/08/20 0728  GLUCAP 316* 393* 191* 140* 209*   Lipid Profile: Recent Labs    11/07/20 0520  CHOL 165  HDL 43  LDLCALC 106*  TRIG 78  CHOLHDL 3.8   Thyroid Function Tests: No results for input(s): TSH, T4TOTAL, FREET4, T3FREE, THYROIDAB in the last 72 hours. Anemia Panel: No results for input(s): VITAMINB12, FOLATE, FERRITIN, TIBC, IRON, RETICCTPCT in the last 72 hours. Sepsis Labs: No results for input(s): PROCALCITON, LATICACIDVEN in the last 168 hours.  Recent Results (from the past 240 hour(s))  Resp Panel by RT-PCR (Flu A&B, Covid) Nasopharyngeal Swab     Status: None   Collection Time: 11/06/20  7:17 AM   Specimen: Nasopharyngeal Swab; Nasopharyngeal(NP) swabs in vial transport medium  Result Value Ref Range Status   SARS Coronavirus 2 by RT PCR NEGATIVE NEGATIVE Final    Comment: (NOTE) SARS-CoV-2 target nucleic acids are NOT DETECTED.  The SARS-CoV-2 RNA is generally detectable in upper respiratory specimens during the  acute phase of infection. The lowest concentration of SARS-CoV-2 viral copies this assay can detect is 138 copies/mL. A negative result does not preclude SARS-Cov-2 infection and should not be used as the sole basis for treatment or other patient management decisions. A negative result may occur with  improper specimen collection/handling, submission of specimen other than nasopharyngeal swab, presence of viral mutation(s) within the areas targeted by this assay, and inadequate number of viral copies(<138 copies/mL). A negative result must be combined with clinical observations, patient history, and epidemiological information. The expected result is Negative.  Fact Sheet for Patients:  EntrepreneurPulse.com.au  Fact Sheet for Healthcare Providers:  IncredibleEmployment.be  This test is no t yet approved or cleared by the [place] FDA and  has been authorized for detection and/or diagnosis of SARS-CoV-2 by FDA under an Emergency Use Authorization (EUA). This EUA will remain  in effect (meaning this test can be used) for the duration of the COVID-19 declaration under Section 564(b)(1) of the Act, 21 U.S.C.section 360bbb-3(b)(1), unless the authorization is terminated  or revoked sooner.       Influenza A by PCR NEGATIVE NEGATIVE Final   Influenza B by PCR NEGATIVE NEGATIVE Final    Comment: (NOTE) The Xpert Xpress SARS-CoV-2/FLU/RSV plus assay is intended as an aid in the diagnosis of influenza from Nasopharyngeal swab specimens and should not be used as a sole basis for treatment. Nasal washings and aspirates are unacceptable for Xpert Xpress SARS-CoV-2/FLU/RSV testing.  Fact Sheet for Patients: EntrepreneurPulse.com.au  Fact Sheet for Healthcare Providers: IncredibleEmployment.be  This test is not yet approved or cleared by the [place] FDA and has been authorized for detection and/or  diagnosis of SARS-CoV-2 by FDA under an Emergency Use Authorization (EUA). This EUA will remain in effect (meaning this test can be used) for the duration of the COVID-19 declaration under Section 564(b)(1) of the Act, 21 U.S.C. section 360bbb-3(b)(1), unless the authorization is terminated or revoked.  Performed at [facility], [address] [place], [place] [address]          Radiology Studies: DG Cervical Spine 2 or 3 views  Result Date: 11/07/2020 CLINICAL DATA:  Status post cervical fusion EXAM: CERVICAL SPINE - 2-3 VIEW COMPARISON:  Intraoperative cervical spine images Nov 06, 2020; cervical MR Nov 06, 2020 FINDINGS: Frontal and lateral views were obtained. Patient is status post anterior screw and  plate fixation from K0-U5 with disc spacers at C5-6 and C6-7. Support hardware intact. No fracture or spondylolisthesis. Air in the prevertebral soft tissues is consistent with recent surgery. Predental space region normal. Disc spaces appear unremarkable. Occasional foci of carotid artery calcification bilaterally noted. Visualized lung apices clear. IMPRESSION: Anterior screw and plate fixation from K2-H0 with disc spacers at C5-6 and C6-7. Support hardware intact. No fracture or spondylolisthesis. No evident arthropathy. Prevertebral soft tissue air consistent with recent surgery. Scattered foci of carotid artery calcification bilaterally noted. Electronically Signed   By: Lowella Grip III M.D.   On: 11/07/2020 13:20   DG Cervical Spine 2-3 Views  Result Date: 11/06/2020 CLINICAL DATA:  Anterior cervical decompression EXAM: CERVICAL SPINE - 2-3 VIEW COMPARISON:  MRI from earlier in the same day. FLUOROSCOPY TIME:  Radiation Exposure Index (as provided by the fluoroscopic device): 0.75 mGy If the device does not provide the exposure index: Fluoroscopy Time:  5 seconds Number of Acquired Images:  2 FINDINGS: Initial images show a surgical instrument at the anterior aspect of  the C5-6 interspace. Subsequently anterior fixation plate from W2-B7 is noted. IMPRESSION: C5-C7 cervical fusion. Electronically Signed   By: Inez Catalina M.D.   On: 11/06/2020 20:24   DG C-Arm 1-60 Min-No Report  Result Date: 11/06/2020 Fluoroscopy was utilized by the requesting physician.  No radiographic interpretation.   ECHOCARDIOGRAM COMPLETE  Result Date: 11/07/2020    ECHOCARDIOGRAM REPORT   Patient Name:   Renee Mack Date of Exam: 11/07/2020 Medical Rec #:  [id number]     Height:       61.0 in Accession #:    [id number]    Weight:       127.8 lb Date of Birth:  Jan 22, 1966      BSA:          1.561 m Patient Age:    37 years      BP:           121/68 mmHg Patient Gender: F             HR:           91 bpm. Exam Location:  [facility] Procedure: 2D Echo, Cardiac Doppler and Color Doppler Indications:     R07.9 Chest pain  History:         Patient has prior history of Echocardiogram examinations, most                  recent 03/25/2018. Risk Factors:Former Smoker. Myocardial                  infarction. Coronary artery disease. COPD. Congestive heart                  failure.  Sonographer:     Wilford Sports Rodgers-Jones Referring Phys:  [place] Diagnosing Phys: Ida Rogue MD IMPRESSIONS  1. Left ventricular ejection fraction, by estimation, is 55 %. The left ventricle has normal function. The left ventricle has no regional wall motion abnormalities. Left ventricular diastolic parameters are consistent with Grade I diastolic dysfunction (impaired relaxation).  2. Right ventricular systolic function is normal. The right ventricular size is normal. Tricuspid regurgitation signal is inadequate for assessing PA pressure. FINDINGS  Left Ventricle: Left ventricular ejection fraction, by estimation, is 55 %. The left ventricle has normal function. The left ventricle has no regional wall motion abnormalities. The left ventricular internal cavity size was normal in size. There is no left ventricular  hypertrophy. Left ventricular diastolic parameters are consistent with Grade I diastolic dysfunction (impaired relaxation). Right Ventricle: The right ventricular size is normal. No increase in right ventricular wall thickness. Right ventricular systolic function is normal. Tricuspid regurgitation signal is inadequate for assessing PA pressure. Left Atrium: Left atrial size was normal in size. Right Atrium: Right atrial size was normal in size. Pericardium: There is no evidence of pericardial effusion. Mitral Valve: The mitral valve is normal in structure. No evidence of mitral valve regurgitation. No evidence of mitral valve stenosis. Tricuspid Valve: The tricuspid valve is normal in structure. Tricuspid valve regurgitation is not demonstrated. No evidence of tricuspid stenosis. Aortic Valve: The aortic valve is normal in structure. Aortic valve regurgitation is not visualized. No aortic stenosis is present. Pulmonic Valve: The pulmonic valve was normal in structure. Pulmonic valve regurgitation is not visualized. No evidence of pulmonic stenosis. Aorta: The aortic root is normal in size and structure. Venous: The inferior vena cava is normal in size with greater than 50% respiratory variability, suggesting right atrial pressure of 3 mmHg. IAS/Shunts: No atrial level shunt detected by color flow Doppler.  LEFT VENTRICLE PLAX 2D LVIDd:         4.45 cm  Diastology LVIDs:         2.73 cm  LV e' medial:    7.18 cm/s LV PW:         0.88 cm  LV E/e' medial:  13.9 LV IVS:        0.87 cm  LV e' lateral:   8.81 cm/s LVOT diam:     1.80 cm  LV E/e' lateral: 11.4 LV SV:         59 LV SV Index:   38 LVOT Area:     2.54 cm  RIGHT VENTRICLE             IVC RV Basal diam:  2.76 cm     IVC diam: 1.61 cm RV S prime:     19.00 cm/s TAPSE (M-mode): 2.1 cm LEFT ATRIUM             Index       RIGHT ATRIUM          Index LA diam:        4.00 cm 2.56 cm/m  RA Area:     7.25 cm LA Vol (A2C):   29.5 ml 18.89 ml/m RA Volume:   13.00 ml  8.33 ml/m LA Vol (A4C):   23.7 ml 15.18 ml/m LA Biplane Vol: 27.7 ml 17.74 ml/m  AORTIC VALVE LVOT Vmax:   108.00 cm/s LVOT Vmean:  75.700 cm/s LVOT VTI:    0.231 m  AORTA Ao Root diam: 3.20 cm MITRAL VALVE MV Area (PHT): 3.72 cm     SHUNTS MV Decel Time: 204 msec     Systemic VTI:  0.23 m MV E velocity: 100.00 cm/s  Systemic Diam: 1.80 cm MV A velocity: 114.00 cm/s MV E/A ratio:  0.88 Ida Rogue MD Electronically signed by Ida Rogue MD Signature Date/Time: 11/07/2020/3:22:53 PM    Final         Scheduled Meds: . amitriptyline  75 mg Oral QHS  . aspirin EC  81 mg Oral Daily  . atorvastatin  80 mg Oral q1800  . enoxaparin (LOVENOX) injection  40 mg Subcutaneous Q24H  . famotidine  20 mg Oral BID  . insulin aspart  0-5 Units Subcutaneous QHS  . insulin aspart  0-9 Units Subcutaneous TID WC  .  insulin aspart protamine- aspart  12 Units Subcutaneous BID WC  . mirtazapine  15 mg Oral QHS  . oxyCODONE  80 mg Oral Q8H  . pantoprazole  40 mg Oral QAC breakfast  . ranolazine  500 mg Oral BID  . silver sulfADIAZINE   Topical Daily  . sucralfate  1 g Oral QID   Continuous Infusions: . sodium chloride 50 mL/hr at 11/07/20 1830     LOS: 2 days    Time spent: 25 minutes    Sidney Ace, MD [facility] Pager [phone]  If 7PM-7AM, please contact night-coverage 11/08/2020, 10:24 AM

## 2020-11-08 NOTE — Progress Notes (Signed)
Physical Therapy Treatment Patient Details Name: Renee Mack MRN: [id number] DOB: 12/10/1965 Today's Date: 11/08/2020    History of Present Illness presented to ER secondary to chest pain, L UE weakness/numbness; admitted for management of spinal stenosis of cervical region, s/p emergent C5-7 ACDF (11/06/20). Of note, currently undergoing radiation therapy for vulvular cancer.    PT Comments    Patient alert, eager for mobility despite pain, rated cervical pain as 5-6/10 recent administration of medicine by RN. Pt was able to perform ankle pumps bilaterally, quad set x10 on LLE and SAQ AROM on LLE x10 as well. Pt excited by progression of strength in quad. Pt noted to have grip 3/5 on LUE, little to no L wrist strength. Log roll technique performed min-modA, and good sitting balance noted. The patient was able to stand with modA and RW, and transfer to recliner with modA as well. Incontinent episode noted, and again when changing linens/gowns/mesh underwear, further mobility held but pt would benefit significantly from further ambulation. The patient remains highly motivated and exhibits the ability for significant improvement, CIR still appropriate.      Follow Up Recommendations  CIR     Equipment Recommendations  None recommended by PT    Recommendations for Other Services       Precautions / Restrictions Precautions Precautions: Fall;Cervical Restrictions Weight Bearing Restrictions: No    Mobility  Bed Mobility Overal bed mobility: Needs Assistance Bed Mobility: Sidelying to Sit   Sidelying to sit: Min assist Supine to sit: Mod assist     General bed mobility comments: step by step cueing for sequencing    Transfers Overall transfer level: Needs assistance Equipment used: Rolling walker (2 wheeled) Transfers: Sit to/from Stand Sit to Stand: Mod assist         General transfer comment: assist to place LUE on RW  Ambulation/Gait             General Gait  Details: deferred, pt with several instances of incontinence. Pt eager to ambulate next session   Stairs             Wheelchair Mobility    Modified Rankin (Stroke Patients Only)       Balance Overall balance assessment: Needs assistance Sitting-balance support: No upper extremity supported;Feet supported Sitting balance-Leahy Scale: Fair     Standing balance support: Bilateral upper extremity supported Standing balance-Leahy Scale: Poor                              Cognition Arousal/Alertness: Awake/alert Behavior During Therapy: WFL for tasks assessed/performed Overall Cognitive Status: Within Functional Limits for tasks assessed                                        Exercises      General Comments        Pertinent Vitals/Pain Pain Assessment: No/denies pain Pain Score: 5  Pain Location: cervical area Pain Descriptors / Indicators: Aching;Guarding;Grimacing Pain Intervention(s): Limited activity within patient's tolerance;Monitored during session;Repositioned;Premedicated before session    Home Living                      Prior Function            PT Goals (current goals can now be found in the care plan section) Progress towards PT goals: Progressing toward  goals    Frequency    7X/week      PT Plan Current plan remains appropriate    Co-evaluation              AM-PAC PT "6 Clicks" Mobility   Outcome Measure  Help needed turning from your back to your side while in a flat bed without using bedrails?: A Little Help needed moving from lying on your back to sitting on the side of a flat bed without using bedrails?: A Lot Help needed moving to and from a bed to a chair (including a wheelchair)?: A Lot Help needed standing up from a chair using your arms (e.g., wheelchair or bedside chair)?: A Lot Help needed to walk in hospital room?: A Lot Help needed climbing 3-5 steps with a railing? : A Lot 6  Click Score: 13    End of Session Equipment Utilized During Treatment: Gait belt Activity Tolerance: Patient tolerated treatment well Patient left: in chair;with call bell/phone within reach;with chair alarm set Nurse Communication: Mobility status PT Visit Diagnosis: Muscle weakness (generalized) (M62.81);Difficulty in walking, not elsewhere classified (R26.2)     Time: 0350-0938 PT Time Calculation (min) (ACUTE ONLY): 28 min  Charges:  $Therapeutic Activity: 23-37 mins                     Lieutenant Diego PT, DPT 3:55 PM,11/08/20

## 2020-11-08 NOTE — Plan of Care (Signed)
  Problem: Education: Goal: Ability to verbalize activity precautions or restrictions will improve Outcome: Progressing   Problem: Activity: Goal: Ability to avoid complications of mobility impairment will improve Outcome: Progressing Goal: Will remain free from falls Outcome: Progressing   Problem: Bowel/Gastric: Goal: Gastrointestinal status for postoperative course will improve Outcome: Progressing   Problem: Clinical Measurements: Goal: Ability to maintain clinical measurements within normal limits will improve Outcome: Progressing   Problem: Pain Management: Goal: Pain level will decrease Outcome: Progressing   Problem: Skin Integrity: Goal: Will show signs of wound healing Outcome: Progressing

## 2020-11-08 NOTE — Progress Notes (Signed)
Inpatient Rehab Admissions Coordinator Note:   Per PT/OT recommendations, pt was screened for CIR candidacy by Gayland Curry, MS, CCC-SLP.  At this time we are recommending an inpatient rehab consult. AC will place consult order per protocol.  Please contact me with questions.    Gayland Curry, [place], [place] Admissions Coordinator [phone] 11/08/20 5:21 PM

## 2020-11-08 NOTE — Progress Notes (Signed)
Oxycontin not available pharmacy called said they will send same shortly same retime. Pt resting comfortably  In bed

## 2020-11-09 DIAGNOSIS — M4802 Spinal stenosis, cervical region: Secondary | ICD-10-CM | POA: Diagnosis not present

## 2020-11-09 LAB — BASIC METABOLIC PANEL
Anion gap: 10 (ref 5–15)
BUN: 10 mg/dL (ref 6–20)
CO2: 22 mmol/L (ref 22–32)
Calcium: 8.7 mg/dL — ABNORMAL LOW (ref 8.9–10.3)
Chloride: 106 mmol/L (ref 98–111)
Creatinine, Ser: 0.46 mg/dL (ref 0.44–1.00)
GFR, Estimated: >60 mL/min (ref >60)
Glucose, Bld: 113 mg/dL — ABNORMAL HIGH (ref 70–99)
Potassium: 3.5 mmol/L (ref 3.5–5.1)
Sodium: 138 mmol/L (ref 135–145)

## 2020-11-09 LAB — GLUCOSE, CAPILLARY
Glucose-Capillary: 131 mg/dL — ABNORMAL HIGH (ref 70–99)
Glucose-Capillary: 86 mg/dL (ref 70–99)
Glucose-Capillary: 88 mg/dL (ref 70–99)
Glucose-Capillary: 63 mg/dL — ABNORMAL LOW (ref 70–99)
Glucose-Capillary: 79 mg/dL (ref 70–99)

## 2020-11-09 LAB — CBC WITH DIFFERENTIAL/PLATELET
Abs Immature Granulocytes: 0.02 10*3/uL (ref 0.00–0.07)
Basophils Absolute: 0 10*3/uL (ref 0.0–0.1)
Basophils Relative: 0 %
Eosinophils Absolute: 0.4 10*3/uL (ref 0.0–0.5)
Eosinophils Relative: 6 %
HCT: 44.1 % (ref 36.0–46.0)
Hemoglobin: 14.3 g/dL (ref 12.0–15.0)
Immature Granulocytes: 0 %
Lymphocytes Relative: 32 %
Lymphs Abs: 2 10*3/uL (ref 0.7–4.0)
MCH: 31 pg (ref 26.0–34.0)
MCHC: 32.4 g/dL (ref 30.0–36.0)
MCV: 95.5 fL (ref 80.0–100.0)
Monocytes Absolute: 0.4 10*3/uL (ref 0.1–1.0)
Monocytes Relative: 7 %
Neutro Abs: 3.3 10*3/uL (ref 1.7–7.7)
Neutrophils Relative %: 55 %
Platelets: 221 10*3/uL (ref 150–400)
RBC: 4.62 MIL/uL (ref 3.87–5.11)
RDW: 13.2 % (ref 11.5–15.5)
WBC: 6.1 10*3/uL (ref 4.0–10.5)
nRBC: 0 % (ref 0.0–0.2)

## 2020-11-09 MED ORDER — ACETAMINOPHEN 500 MG PO TABS
1000.0000 mg | ORAL_TABLET | Freq: Three times a day (TID) | ORAL | Status: DC
  Administered 2020-11-10 – 2020-11-14 (×10): 1000 mg via ORAL
  Filled 2020-11-09 (×14): qty 2

## 2020-11-09 MED ORDER — METHOCARBAMOL 500 MG PO TABS
500.0000 mg | ORAL_TABLET | Freq: Four times a day (QID) | ORAL | Status: DC
  Administered 2020-11-09 – 2020-11-10 (×5): 500 mg via ORAL
  Filled 2020-11-09 (×5): qty 1

## 2020-11-09 MED ORDER — DEXAMETHASONE SODIUM PHOSPHATE 4 MG/ML IJ SOLN
4.0000 mg | Freq: Two times a day (BID) | INTRAMUSCULAR | Status: DC
  Filled 2020-11-09: qty 1

## 2020-11-09 NOTE — Progress Notes (Signed)
Physical Therapy Treatment Patient Details Name: Renee Mack MRN: [id number] DOB: December 22, 1965 Today's Date: 11/09/2020    History of Present Illness presented to ER secondary to chest pain, L UE weakness/numbness; admitted for management of spinal stenosis of cervical region, s/p emergent C5-7 ACDF (11/06/20). Of note, currently undergoing radiation therapy for vulvular cancer.    PT Comments    Patient agreeable to PT. Patient required cues for technique for bed mobility technique to maintain general spine precautions with activity. Minimal assistance for bed mobility. Patient progressed to ambulating in the room with steadying assistance using rolling walker with cues for technique. Patient continues to have urine incontinence with activity and requires maximal assistance for peri-care, donning/doffing mesh underwear. Recommend to continue PT to maximize independence and facilitate return to prior level of function. Continue to recommend CIR at discharge.    Follow Up Recommendations  CIR     Equipment Recommendations  None recommended by PT    Recommendations for Other Services       Precautions / Restrictions Precautions Precautions: Fall;Cervical Restrictions Weight Bearing Restrictions: No    Mobility  Bed Mobility Overal bed mobility: Needs Assistance Bed Mobility: Supine to Sit;Sit to Supine     Supine to sit: Min assist Sit to supine: Min assist   General bed mobility comments: assistance for trunk support to sit upright and assistance for LE support to return to bed. verbal cues for technique to maintain spine precautions    Transfers Overall transfer level: Needs assistance Equipment used: Rolling walker (2 wheeled) Transfers: Sit to/from Stand Sit to Stand: Min assist         General transfer comment: lifting assistance required for standing. multiple standing bouts performed. verbal cues for technique  Ambulation/Gait Ambulation/Gait assistance: Min  assist Gait Distance (Feet): 25 Feet Assistive device: Rolling walker (2 wheeled) Gait Pattern/deviations: Step-to pattern Gait velocity: decreased   General Gait Details: cues to maintain rolling walker closer to base of support. steadying assistance for ambulation   Stairs             Wheelchair Mobility    Modified Rankin (Stroke Patients Only)       Balance Overall balance assessment: Needs assistance Sitting-balance support: No upper extremity supported;Feet supported Sitting balance-Leahy Scale: Fair     Standing balance support: Bilateral upper extremity supported Standing balance-Leahy Scale: Fair Standing balance comment: patient relying heavily on rolling walker for support in standing                            Cognition Arousal/Alertness: Awake/alert Behavior During Therapy: WFL for tasks assessed/performed Overall Cognitive Status: Within Functional Limits for tasks assessed                                        Exercises      General Comments General comments (skin integrity, edema, etc.): patient with urine incontinence with sitting up and standing.      Pertinent Vitals/Pain Pain Assessment: 0-10 Pain Score: 8  Pain Location: anterior neck Pain Descriptors / Indicators: Aching Pain Intervention(s): Limited activity within patient's tolerance;Premedicated before session    Home Living                      Prior Function            PT Goals (current goals  can now be found in the care plan section) Acute Rehab PT Goals Patient Stated Goal: to get back home PT Goal Formulation: With patient Time For Goal Achievement: 11/21/20 Potential to Achieve Goals: Fair Progress towards PT goals: Progressing toward goals    Frequency    7X/week      PT Plan Current plan remains appropriate    Co-evaluation              AM-PAC PT "6 Clicks" Mobility   Outcome Measure  Help needed turning  from your back to your side while in a flat bed without using bedrails?: A Little Help needed moving from lying on your back to sitting on the side of a flat bed without using bedrails?: A Little Help needed moving to and from a bed to a chair (including a wheelchair)?: A Little Help needed standing up from a chair using your arms (e.g., wheelchair or bedside chair)?: A Little Help needed to walk in hospital room?: A Little Help needed climbing 3-5 steps with a railing? : A Lot 6 Click Score: 17    End of Session Equipment Utilized During Treatment: Gait belt Activity Tolerance: Patient tolerated treatment well Patient left: in bed;with call bell/phone within reach;with bed alarm set Nurse Communication: Mobility status PT Visit Diagnosis: Muscle weakness (generalized) (M62.81);Difficulty in walking, not elsewhere classified (R26.2)     Time: 7628-3151 PT Time Calculation (min) (ACUTE ONLY): 57 min  Charges:  $Gait Training: 8-22 mins $Therapeutic Activity: 38-52 mins                     Minna Merritts, PT, MPT   Percell Locus 11/09/2020, 1:09 PM

## 2020-11-09 NOTE — Progress Notes (Signed)
PROGRESS NOTE    Renee Mack  GUR:[id number] DOB: December 16, 1965 DOA: 11/06/2020 PCP: Olin Hauser, DO   Brief Narrative:  55 y.o. female with medical history significant of HLD, DM, CAD with DES, sCHF with EF 45-50%, vulvar CA (s/p of surgery and radiation therapy), COPD, GERD, depression with anxiety, former smoker, pancreatitis, DKA, who presents with chest pain and left arm weakness.  Pt states that her chest pain started at about 4:00 AM today. Her chest pain is located in the upper chest close to the neck, 8 out of 10 severity, sharp, radiating to the left arm.  Patient has some mild shortness of breath.  Patient has mild cough, no fever or chills. Patient states that she coughed up a little bright red blood, just one episode.  Patient also reports left arm weakness and left toe numbness.  He has some left upper arm pain.   Found on MRI imaging survey to have degenerative changes of cervical spine most pronounced at C6-7.  Moderate severe spinal canal stenosis with mass-effect on the cord which is progressed from prior MRI.  Patient was emergently taken to the operating room with neurosurgery on 5/19 for cervical discectomy and fusion.  Tolerated procedure well.  Postop day 1 evaluated by neurosurgery.  Felt to be stable postoperatively.   About issues with postoperative pain control.  This could be difficult to achieve given her opioid tolerance     Assessment & Plan:   Principal Problem:   Spinal stenosis of cervical region Active Problems:   GERD (gastroesophageal reflux disease)   Hyperlipidemia LDL goal <70   Chest pain   Chronic systolic heart failure (HCC)   CAD (coronary artery disease)   Anxiety associated with depression   Vulvar cancer (HCC)   Diabetes mellitus without complication (HCC)   Hemoptysis   Dilated bile duct  Spinal stenosis of cervical region  Patient's left arm weakness is likely due to cervical spine stenosis.  MRI of C spin showed  degenerative disc disease C5-C7, with mass-effect on spinal cord.   Dr. Izora Ribas of neurosurgery is consulted Status post cervical discectomy with fusion 5/19 Moderate-severe post operative pain Plan: Continue SQ lovenox PT and OT evaluations, recommend CIR TOC and CIR consult As needed pain control Attempt minimize use of IV narcotics in preparation for discharge  GERD (gastroesophageal reflux disease) Continue home Protonix and Pepcid  Hyperlipidemia LDL goal <70 Continue home Lipitor  Chest pain and history of CAD: S/p of DES 2019.  Troponin 12, 11.   Her chest pain is close to the front neck, may be related to the spinal cord stenosis Not indicative of ACS Plan: Cardiology consulted, recommendations appreciated Continue aspirin 81 daily Hold Brilinta postoperatively for at least 7 days  Chronic systolic heart failure ([place])  2D echo on 03/25/2018 showed EF of 45-50%.   Patient does not have leg edema or JVD.   CHF seem to be compensated. -Check BNP --> 58.7  Anxiety associated with depression -Continue home medications  Diabetes mellitus without complication (HCC)  Recent A1c 10.4, poorly controlled.   Patient still taking 70/30 insulin. Improved control over interval Plan:. Continue 70/30 insulin.  12 units twice daily Sensitive sliding scale before meals and at bedtime Diabetes coordinator consult Carb modified diet  Hemoptysis CT angiograms negative for aortic dissection or central PE -Hold Brilinta -Continue aspirin  Dilated bile duct  CT angiogram incidentally showed extrahepatic bile duct dilation.   No GI symptoms.  Liver function normal. -Monitoring for  any GI symptoms  Vulvar cancer  s/p of surgery and XRT. -pt is high dose pain med,  OxyContin 80 mg twice daily and as needed oxycodone 30 mg every 8 hours   DVT prophylaxis: SQ Lovenox  code Status: Full Family Communication: Daughter [name] [phone] on 5/22 Disposition Plan:  Status is: Inpatient  Remains inpatient appropriate because:Inpatient level of care appropriate due to severity of illness   Dispo: The patient is from: Home              Anticipated d/c is to: Home              Patient currently is not medically stable to d/c.   Difficult to place patient No  Postoperative day #3 status post anterior cervical discectomy and fusion.  Postoperative pain control.  Diabetes management.  CIR recommended by therapy.  TOC consult in place.     Level of care: Med-Surg  Consultants:   Neurosurgery  Cardiology  Procedures:  Anterior cervical discectomy and fusion, 5/19  Antimicrobials:   None   Subjective: Patient seen and examined.  Complains of pain at anterior neck this morning.  Poor to moderate control on current regimen.  Objective: Vitals:   11/08/20 2011 11/08/20 2048 11/09/20 0402 11/09/20 0747  BP: (!) 105/57 (!) 118/59 131/75 138/64  Pulse: 99 98 85 83  Resp: 20  14 18   Temp: 97.7 F (36.5 C)  97.9 F (36.6 C) 98.1 F (36.7 C)  TempSrc:   Oral   SpO2: 92%  100% 98%  Weight:      Height:        Intake/Output Summary (Last 24 hours) at 11/09/2020 1006 Last data filed at 11/09/2020 0612 Gross per 24 hour  Intake 480 ml  Output 1100 ml  Net -620 ml   Filed Weights   11/06/20 0621 11/06/20 1702 11/07/20 0330  Weight: 58.5 kg 59 kg 58 kg    Examination:  General exam: Distress due to pain Respiratory system: Poor respiratory effort.  Lungs clear.  Room air Cardiovascular system: S1 & S2 heard, RRR. No JVD, murmurs, rubs, gallops or clicks. No pedal edema. Gastrointestinal system: Abdomen is nondistended, soft and nontender. No organomegaly or masses felt. Normal bowel sounds heard. Central nervous system: Alert and oriented. No focal neurological deficits. Extremities: Decreased power left upper extremity Skin: No rashes, lesions or ulcers Psychiatry: Judgement and insight appear normal. Mood & affect appropriate.      Data Reviewed: I have personally reviewed following labs and imaging studies  CBC: Recent Labs  Lab 11/06/20 0629 11/07/20 0520  WBC 6.7 12.7*  NEUTROABS 4.0  --   HGB 14.3 12.7  HCT 42.4 37.6  MCV 91.4 91.7  PLT 298 010   Basic Metabolic Panel: Recent Labs  Lab 11/06/20 0629 11/07/20 0520  NA 137 135  K 4.1 4.1  CL 102 104  CO2 25 20*  GLUCOSE 281* 370*  BUN 9 12  CREATININE 0.60 0.51  CALCIUM 9.4 8.7*  MG 1.7  --    GFR: Estimated Creatinine Clearance: 64.3 mL/min (by C-G formula based on SCr of 0.51 mg/dL). Liver Function Tests: Recent Labs  Lab 11/06/20 0629  AST 13*  ALT 12  ALKPHOS 98  BILITOT 0.5  PROT 7.5  ALBUMIN 3.5   No results for input(s): LIPASE, AMYLASE in the last 168 hours. No results for input(s): AMMONIA in the last 168 hours. Coagulation Profile: Recent Labs  Lab 11/06/20 0629  INR 1.0  Cardiac Enzymes: No results for input(s): CKTOTAL, CKMB, CKMBINDEX, TROPONINI in the last 168 hours. BNP (last 3 results) No results for input(s): PROBNP in the last 8760 hours. HbA1C: Recent Labs    11/06/20 1350  HGBA1C 11.1*   CBG: Recent Labs  Lab 11/08/20 0728 11/08/20 1213 11/08/20 1643 11/08/20 2244 11/09/20 0750  GLUCAP 209* 135* 147* 75 131*   Lipid Profile: Recent Labs    11/07/20 0520  CHOL 165  HDL 43  LDLCALC 106*  TRIG 78  CHOLHDL 3.8   Thyroid Function Tests: No results for input(s): TSH, T4TOTAL, FREET4, T3FREE, THYROIDAB in the last 72 hours. Anemia Panel: No results for input(s): VITAMINB12, FOLATE, FERRITIN, TIBC, IRON, RETICCTPCT in the last 72 hours. Sepsis Labs: No results for input(s): PROCALCITON, LATICACIDVEN in the last 168 hours.  Recent Results (from the past 240 hour(s))  Resp Panel by RT-PCR (Flu A&B, Covid) Nasopharyngeal Swab     Status: None   Collection Time: 11/06/20  7:17 AM   Specimen: Nasopharyngeal Swab; Nasopharyngeal(NP) swabs in vial transport medium  Result Value Ref Range  Status   SARS Coronavirus 2 by RT PCR NEGATIVE NEGATIVE Final    Comment: (NOTE) SARS-CoV-2 target nucleic acids are NOT DETECTED.  The SARS-CoV-2 RNA is generally detectable in upper respiratory specimens during the acute phase of infection. The lowest concentration of SARS-CoV-2 viral copies this assay can detect is 138 copies/mL. A negative result does not preclude SARS-Cov-2 infection and should not be used as the sole basis for treatment or other patient management decisions. A negative result may occur with  improper specimen collection/handling, submission of specimen other than nasopharyngeal swab, presence of viral mutation(s) within the areas targeted by this assay, and inadequate number of viral copies(<138 copies/mL). A negative result must be combined with clinical observations, patient history, and epidemiological information. The expected result is Negative.  Fact Sheet for Patients:  EntrepreneurPulse.com.au  Fact Sheet for Healthcare Providers:  IncredibleEmployment.be  This test is no t yet approved or cleared by the [place] FDA and  has been authorized for detection and/or diagnosis of SARS-CoV-2 by FDA under an Emergency Use Authorization (EUA). This EUA will remain  in effect (meaning this test can be used) for the duration of the COVID-19 declaration under Section 564(b)(1) of the Act, 21 U.S.C.section 360bbb-3(b)(1), unless the authorization is terminated  or revoked sooner.       Influenza A by PCR NEGATIVE NEGATIVE Final   Influenza B by PCR NEGATIVE NEGATIVE Final    Comment: (NOTE) The Xpert Xpress SARS-CoV-2/FLU/RSV plus assay is intended as an aid in the diagnosis of influenza from Nasopharyngeal swab specimens and should not be used as a sole basis for treatment. Nasal washings and aspirates are unacceptable for Xpert Xpress SARS-CoV-2/FLU/RSV testing.  Fact Sheet for  Patients: EntrepreneurPulse.com.au  Fact Sheet for Healthcare Providers: IncredibleEmployment.be  This test is not yet approved or cleared by the [place] FDA and has been authorized for detection and/or diagnosis of SARS-CoV-2 by FDA under an Emergency Use Authorization (EUA). This EUA will remain in effect (meaning this test can be used) for the duration of the COVID-19 declaration under Section 564(b)(1) of the Act, 21 U.S.C. section 360bbb-3(b)(1), unless the authorization is terminated or revoked.  Performed at [facility], [address], [place], [place] [address]          Radiology Studies: DG Cervical Spine 2 or 3 views  Result Date: 11/07/2020 CLINICAL DATA:  Status post cervical  fusion EXAM: CERVICAL SPINE - 2-3 VIEW COMPARISON:  Intraoperative cervical spine images Nov 06, 2020; cervical MR Nov 06, 2020 FINDINGS: Frontal and lateral views were obtained. Patient is status post anterior screw and plate fixation from A2-Z3 with disc spacers at C5-6 and C6-7. Support hardware intact. No fracture or spondylolisthesis. Air in the prevertebral soft tissues is consistent with recent surgery. Predental space region normal. Disc spaces appear unremarkable. Occasional foci of carotid artery calcification bilaterally noted. Visualized lung apices clear. IMPRESSION: Anterior screw and plate fixation from Y8-M5 with disc spacers at C5-6 and C6-7. Support hardware intact. No fracture or spondylolisthesis. No evident arthropathy. Prevertebral soft tissue air consistent with recent surgery. Scattered foci of carotid artery calcification bilaterally noted. Electronically Signed   By: Lowella Grip III M.D.   On: 11/07/2020 13:20        Scheduled Meds: . acetaminophen  1,000 mg Oral TID  . amitriptyline  75 mg Oral QHS  . aspirin EC  81 mg Oral Daily  . atorvastatin  80 mg Oral q1800  . enoxaparin (LOVENOX) injection  40 mg  Subcutaneous Q24H  . famotidine  20 mg Oral BID  . insulin aspart  0-5 Units Subcutaneous QHS  . insulin aspart  0-9 Units Subcutaneous TID WC  . insulin aspart protamine- aspart  12 Units Subcutaneous BID WC  . methocarbamol  500 mg Oral QID  . mirtazapine  15 mg Oral QHS  . oxyCODONE  80 mg Oral Q8H  . pantoprazole  40 mg Oral QAC breakfast  . ranolazine  500 mg Oral BID  . silver sulfADIAZINE   Topical Daily  . sucralfate  1 g Oral QID   Continuous Infusions: . sodium chloride 50 mL/hr at 11/08/20 1727     LOS: 3 days    Time spent: 15 minutes    Sidney Ace, MD [facility] Pager [phone]  If 7PM-7AM, please contact night-coverage 11/09/2020, 10:06 AM

## 2020-11-09 NOTE — Progress Notes (Signed)
Inpatient Rehab Admissions:  Inpatient Rehab Consult received.  I called pt's room at [facility] and spoke with her on the telephone for rehabilitation assessment and to discuss goals and expectations of an inpatient rehab admission.  Pt acknowledged understanding. Pt told AC that she would prefer to receive [facility] therapy. TOC made aware.  AC will sign off.  Signed: Gayland Curry, [place], [place] Admissions Coordinator [phone]

## 2020-11-09 NOTE — Progress Notes (Signed)
Occupational Therapy Treatment Patient Details Name: Renee Mack MRN: [id number] DOB: 02-Dec-1965 Today's Date: 11/09/2020    History of present illness presented to ER secondary to chest pain, L UE weakness/numbness; admitted for management of spinal stenosis of cervical region, s/p emergent C5-7 ACDF (11/06/20). Of note, currently undergoing radiation therapy for vulvular cancer.   OT comments  Renee Mack presents to OT pleasant and motivated to participate in today's session focused on ADL practice and self care.  Pt remains limited by pain, but was motivated to engage in ADL practice.  OT provided stand by assist for bed mobility, and min guard assist for sit to stand and stand pivot transfer with use of RW.  Pt performed toileting routine with min guard assist in mobility and mod assist for managing LB clothing with BSC.  No incontinence noted in today's session.  OT provided education re: non-pharmacological strategies to manage pain, including visualization, distraction, and deep breathing techniques.  Pt verbalized understanding and engaged well with education provided.  OT provided total assist in donning/doffing B socks while pt sitting at EOB. Pt unable to perform figure 4 position to aid in LBD while adhering to cervical precautions, but does report owning and knowing how to utilize a reacher and sock aid at home.  OT provided min cues throughout session for adhering to cervical precautions.  Pt will continue to benefit from skilled OT services in acute setting to address pain, functional use of LUE, and safety and independence in ADLs.  Pt would benefit from CIR after discharge to address these same goals, but pt reports wanting to discharge home.  Should pt discharge home, she would benefit from home health OT after inpatient admission.   Follow Up Recommendations  CIR    Equipment Recommendations  3 in 1 bedside commode    Recommendations for Other Services      Precautions /  Restrictions Precautions Precautions: Fall;Cervical Restrictions Weight Bearing Restrictions: No       Mobility Bed Mobility Overal bed mobility: Needs Assistance Bed Mobility: Sidelying to Sit;Sit to Sidelying;Sit to Supine;Supine to Sit   Sidelying to sit: Supervision Supine to sit: Supervision Sit to supine: Supervision Sit to sidelying: Supervision General bed mobility comments: provided stand by assist for log rolling and supine <> sit transfers Patient Response: Cooperative  Transfers Overall transfer level: Needs assistance Equipment used: Rolling walker (2 wheeled) Transfers: Sit to/from Stand Sit to Stand: Min guard         General transfer comment: verbal cues for technique, no assist provided for lifting    Balance Overall balance assessment: Needs assistance Sitting-balance support: No upper extremity supported;Feet supported Sitting balance-Leahy Scale: Fair     Standing balance support: Bilateral upper extremity supported Standing balance-Leahy Scale: Fair Standing balance comment: patient relying heavily on rolling walker for support in standing, able to perform unilateral manual tasks while standing with RW                           ADL either performed or assessed with clinical judgement   ADL Overall ADL's : Needs assistance/impaired                     Lower Body Dressing: Maximal assistance;Sit to/from stand Lower Body Dressing Details (indicate cue type and reason): provided total assist for doffing/donning socks, mod assist for managing briefs while standing during toileting.  Of note, pt reports having reacher/sock aid at home to  assist with LBD. Toilet Transfer: Chief of Staff Details (indicate cue type and reason): provided min guard assist in sit to stand and stand pivot transfer with RW to bedside commode Toileting- Clothing Manipulation and Hygiene: Moderate assistance;Sit to/from  stand Toileting - Clothing Manipulation Details (indicate cue type and reason): mod assist to manage briefs while standing at [facility], unilateral UE support from RW     Functional mobility during ADLs: Min guard;Rolling walker General ADL Comments: anticipate min A needed for seated UB ADLs 2/2 LUE weakness     Vision Patient Visual Report: No change from baseline     Perception     Praxis      Cognition Arousal/Alertness: Awake/alert Behavior During Therapy: WFL for tasks assessed/performed Overall Cognitive Status: Within Functional Limits for tasks assessed                                 General Comments: pain is a barrier, but pt pleasant and motivated to participate        Exercises Other Exercises Other Exercises: provided education and assist in functional transfers, lower body dressing, and toileting, education re: non-pharmacological pain management strategies   Shoulder Instructions       General Comments patient with urine incontinence with sitting up and standing.    Pertinent Vitals/ Pain       Pain Assessment: 0-10 Pain Score: 9  Pain Location: anterior neck Pain Descriptors / Indicators: Guarding;Grimacing;Moaning;Discomfort Pain Intervention(s): Limited activity within patient's tolerance;Monitored during session;Premedicated before session;Patient requesting pain meds-RN notified  Home Living                                          Prior Functioning/Environment              Frequency  Min 3X/week        Progress Toward Goals  OT Goals(current goals can now be found in the care plan section)  Progress towards OT goals: Progressing toward goals  Acute Rehab OT Goals Patient Stated Goal: to get back home OT Goal Formulation: With patient Time For Goal Achievement: 11/21/20 Potential to Achieve Goals: Good  Plan Discharge plan remains appropriate;Frequency remains appropriate    Co-evaluation                  AM-PAC OT "6 Clicks" Daily Activity     Outcome Measure   Help from another person eating meals?: A Little Help from another person taking care of personal grooming?: A Little Help from another person toileting, which includes using toliet, bedpan, or urinal?: A Little Help from another person bathing (including washing, rinsing, drying)?: A Lot Help from another person to put on and taking off regular upper body clothing?: A Lot Help from another person to put on and taking off regular lower body clothing?: A Lot 6 Click Score: 15    End of Session Equipment Utilized During Treatment: Gait belt;Rolling walker  OT Visit Diagnosis: Other abnormalities of gait and mobility (R26.89);Muscle weakness (generalized) (M62.81);Pain;Hemiplegia and hemiparesis Hemiplegia - Right/Left: Left Hemiplegia - dominant/non-dominant: Non-Dominant Hemiplegia - caused by: Unspecified Pain - Right/Left: Left Pain - part of body: Shoulder;Arm (and cervical area)   Activity Tolerance Patient tolerated treatment well   Patient Left in bed;with call bell/phone within reach;with bed alarm set;with nursing/sitter in room  Nurse Communication Patient requests pain meds;Other (comment) (pt's IV complete, RN in room at end of session)        Time: 1328-1410 OT Time Calculation (min): 42 min  Charges: OT General Charges $OT Visit: 1 Visit OT Treatments $Self Care/Home Management : 38-52 mins  Jeneen Montgomery, OTR/L 11/09/20, 2:29 PM

## 2020-11-10 ENCOUNTER — Encounter: Payer: Self-pay | Admitting: Neurosurgery

## 2020-11-10 DIAGNOSIS — M4802 Spinal stenosis, cervical region: Secondary | ICD-10-CM | POA: Diagnosis not present

## 2020-11-10 LAB — GLUCOSE, CAPILLARY
Glucose-Capillary: 218 mg/dL — ABNORMAL HIGH (ref 70–99)
Glucose-Capillary: 143 mg/dL — ABNORMAL HIGH (ref 70–99)
Glucose-Capillary: 165 mg/dL — ABNORMAL HIGH (ref 70–99)
Glucose-Capillary: 102 mg/dL — ABNORMAL HIGH (ref 70–99)
Glucose-Capillary: 175 mg/dL — ABNORMAL HIGH (ref 70–99)

## 2020-11-10 MED ORDER — METHOCARBAMOL 500 MG PO TABS
500.0000 mg | ORAL_TABLET | Freq: Three times a day (TID) | ORAL | Status: DC
  Administered 2020-11-10 – 2020-11-14 (×11): 500 mg via ORAL
  Filled 2020-11-10 (×11): qty 1

## 2020-11-10 MED ORDER — HYDROMORPHONE HCL 1 MG/ML IJ SOLN
1.0000 mg | INTRAMUSCULAR | Status: DC | PRN
Start: 2020-11-10 — End: 2020-11-10

## 2020-11-10 MED ORDER — LIDOCAINE HCL URETHRAL/MUCOSAL 2 % EX GEL
1.0000 "application " | Freq: Once | CUTANEOUS | Status: AC
  Administered 2020-11-10: 1 via URETHRAL
  Filled 2020-11-10: qty 5

## 2020-11-10 NOTE — Progress Notes (Signed)
PROGRESS NOTE    Renee Mack  YIR:[id number] DOB: 06/10/1966 DOA: 11/06/2020 PCP: Renee Hauser, DO   Brief Narrative:  55 y.o. female with medical history significant of HLD, DM, CAD with DES, sCHF with EF 45-50%, vulvar CA (s/p of surgery and radiation therapy), COPD, GERD, depression with anxiety, former smoker, pancreatitis, DKA, who presents with chest pain and left arm weakness.  Pt states that her chest pain started at about 4:00 AM today. Her chest pain is located in the upper chest close to the neck, 8 out of 10 severity, sharp, radiating to the left arm.  Patient has some mild shortness of breath.  Patient has mild cough, no fever or chills. Patient states that she coughed up a little bright red blood, just one episode.  Patient also reports left arm weakness and left toe numbness.  He has some left upper arm pain.   Found on MRI imaging survey to have degenerative changes of cervical spine most pronounced at C6-7.  Moderate severe spinal canal stenosis with mass-effect on the cord which is progressed from prior MRI.  Patient was emergently taken to the operating room with neurosurgery on 5/19 for cervical discectomy and fusion.  Tolerated procedure well.  Postop day 1 evaluated by neurosurgery.  Felt to be stable postoperatively.   About issues with postoperative pain control.  This could be difficult to achieve given her opioid tolerance  5/23: Pain control appears to be improved but patient remains very lethargic including during physical therapy evaluation.  He is currently refusing CIR.  Per PT notes patient has very limited insight into functional deficits and level of assistance required.  In my opinion home even with home health services will not be an optimal disposition.  I have communicated my concerns to the patient.  I have sent message to the care team as well as the neurosurgical consultant for's recommendations.     Assessment & Plan:   Principal  Problem:   Spinal stenosis of cervical region Active Problems:   GERD (gastroesophageal reflux disease)   Hyperlipidemia LDL goal <70   Chest pain   Chronic systolic heart failure (HCC)   CAD (coronary artery disease)   Anxiety associated with depression   Vulvar cancer (HCC)   Diabetes mellitus without complication (HCC)   Hemoptysis   Dilated bile duct  Spinal stenosis of cervical region  Patient's left arm weakness is likely due to cervical spine stenosis.  MRI of C spin showed degenerative disc disease C5-C7, with mass-effect on spinal cord.   Dr. Izora Ribas of neurosurgery is consulted Status post cervical discectomy with fusion 5/19 Moderate-severe post operative pain Improving over interval Plan: Continue SQ lovenox PT and OT evaluations, recommend CIR TOC and CIR consult As needed pain control Attempt minimize use of IV narcotics in preparation for discharge -Patient currently refusing CIR placement.  Will need to discuss with neurosurgery before exploring home with home health options.  GERD (gastroesophageal reflux disease) Continue home Protonix and Pepcid  Hyperlipidemia LDL goal <70 Continue home Lipitor  Chest pain and history of CAD: S/p of DES 2019.  Troponin 12, 11.   Her chest pain is close to the front neck, may be related to the spinal cord stenosis Not indicative of ACS Plan: Cardiology consulted, recommendations appreciated Continue aspirin 81 daily Hold Brilinta postoperatively for at least 7 days  Chronic systolic heart failure ([place])  2D echo on 03/25/2018 showed EF of 45-50%.   Patient does not have leg edema or  JVD.   CHF seem to be compensated. -Check BNP --> 58.7  Anxiety associated with depression -Continue home medications  Diabetes mellitus without complication (HCC)  Recent A1c 10.4, poorly controlled.   Patient still taking 70/30 insulin. Improved control over interval Plan:. Continue 70/30 insulin.  12 units twice  daily Sensitive sliding scale before meals and at bedtime Diabetes coordinator consult Carb modified diet  Hemoptysis CT angiograms negative for aortic dissection or central PE -Hold Brilinta -Continue aspirin  Dilated bile duct  CT angiogram incidentally showed extrahepatic bile duct dilation.   No GI symptoms.  Liver function normal. -Monitoring for any GI symptoms  Vulvar cancer  s/p of surgery and XRT. -pt is high dose pain med,  OxyContin 80 mg twice daily and as needed oxycodone 30 mg every 8 hours   DVT prophylaxis: SQ Lovenox  code Status: Full Family Communication: Daughter [name] [phone] on 5/22 Disposition Plan: Status is: Inpatient  Remains inpatient appropriate because:Inpatient level of care appropriate due to severity of illness   Dispo: The patient is from: Home              Anticipated d/c is to: Home              Patient currently is not medically stable to d/c.   Difficult to place patient No  Postoperative day #4 status post anterior cervical discectomy and fusion.  Postoperative pain control improving.  Diabetic control acceptable.  Patient currently refusing CIR placement.  In my opinion this is not an optimal disposition as patient has significant left upper extremity deficit and limited insight into level assistance at the LV required.     Level of care: Med-Surg  Consultants:   Neurosurgery  Cardiology  Procedures:  Anterior cervical discectomy and fusion, 5/19  Antimicrobials:   None   Subjective: Patient seen and examined.  Pain control improved.  Sitting up in bed eating breakfast.  Objective: Vitals:   11/09/20 1458 11/09/20 2042 11/10/20 0528 11/10/20 0840  BP: 127/73 (!) 141/73 123/65 (!) 116/59  Pulse: 79 81 (!) 110 94  Resp: 18 17 17 18   Temp: 97.7 F (36.5 C) 97.6 F (36.4 C) 99.3 F (37.4 C) 98.2 F (36.8 C)  TempSrc: Oral Oral Oral Oral  SpO2: 99% 98% 91% 95%  Weight:      Height:         Intake/Output Summary (Last 24 hours) at 11/10/2020 1049 Last data filed at 11/10/2020 0600 Gross per 24 hour  Intake 640 ml  Output 3320 ml  Net -2680 ml   Filed Weights   11/06/20 0621 11/06/20 1702 11/07/20 0330  Weight: 58.5 kg 59 kg 58 kg    Examination:  General exam: Distress due to pain Respiratory system: Poor respiratory effort.  Lungs clear.  Room air Cardiovascular system: S1 & S2 heard, RRR. No JVD, murmurs, rubs, gallops or clicks. No pedal edema. Gastrointestinal system: Abdomen is nondistended, soft and nontender. No organomegaly or masses felt. Normal bowel sounds heard. Central nervous system: Alert and oriented. No focal neurological deficits. Extremities: Decreased power left upper extremity.  Limited range of motion Skin: No rashes, lesions or ulcers Psychiatry: Judgement and insight appear normal. Mood & affect appropriate.     Data Reviewed: I have personally reviewed following labs and imaging studies  CBC: Recent Labs  Lab 11/06/20 0629 11/07/20 0520 11/09/20 1105  WBC 6.7 12.7* 6.1  NEUTROABS 4.0  --  3.3  HGB 14.3 12.7 14.3  HCT 42.4  37.6 44.1  MCV 91.4 91.7 95.5  PLT 298 278 161   Basic Metabolic Panel: Recent Labs  Lab 11/06/20 0629 11/07/20 0520 11/09/20 1105  NA 137 135 138  K 4.1 4.1 3.5  CL 102 104 106  CO2 25 20* 22  GLUCOSE 281* 370* 113*  BUN 9 12 10   CREATININE 0.60 0.51 0.46  CALCIUM 9.4 8.7* 8.7*  MG 1.7  --   --    GFR: Estimated Creatinine Clearance: 64.3 mL/min (by C-G formula based on SCr of 0.46 mg/dL). Liver Function Tests: Recent Labs  Lab 11/06/20 0629  AST 13*  ALT 12  ALKPHOS 98  BILITOT 0.5  PROT 7.5  ALBUMIN 3.5   No results for input(s): LIPASE, AMYLASE in the last 168 hours. No results for input(s): AMMONIA in the last 168 hours. Coagulation Profile: Recent Labs  Lab 11/06/20 0629  INR 1.0   Cardiac Enzymes: No results for input(s): CKTOTAL, CKMB, CKMBINDEX, TROPONINI in the last 168  hours. BNP (last 3 results) No results for input(s): PROBNP in the last 8760 hours. HbA1C: No results for input(s): HGBA1C in the last 72 hours. CBG: Recent Labs  Lab 11/09/20 1158 11/09/20 1620 11/09/20 2135 11/09/20 2205 11/10/20 0802  GLUCAP 86 88 63* 79 218*   Lipid Profile: No results for input(s): CHOL, HDL, LDLCALC, TRIG, CHOLHDL, LDLDIRECT in the last 72 hours. Thyroid Function Tests: No results for input(s): TSH, T4TOTAL, FREET4, T3FREE, THYROIDAB in the last 72 hours. Anemia Panel: No results for input(s): VITAMINB12, FOLATE, FERRITIN, TIBC, IRON, RETICCTPCT in the last 72 hours. Sepsis Labs: No results for input(s): PROCALCITON, LATICACIDVEN in the last 168 hours.  Recent Results (from the past 240 hour(s))  Resp Panel by RT-PCR (Flu A&B, Covid) Nasopharyngeal Swab     Status: None   Collection Time: 11/06/20  7:17 AM   Specimen: Nasopharyngeal Swab; Nasopharyngeal(NP) swabs in vial transport medium  Result Value Ref Range Status   SARS Coronavirus 2 by RT PCR NEGATIVE NEGATIVE Final    Comment: (NOTE) SARS-CoV-2 target nucleic acids are NOT DETECTED.  The SARS-CoV-2 RNA is generally detectable in upper respiratory specimens during the acute phase of infection. The lowest concentration of SARS-CoV-2 viral copies this assay can detect is 138 copies/mL. A negative result does not preclude SARS-Cov-2 infection and should not be used as the sole basis for treatment or other patient management decisions. A negative result may occur with  improper specimen collection/handling, submission of specimen other than nasopharyngeal swab, presence of viral mutation(s) within the areas targeted by this assay, and inadequate number of viral copies(<138 copies/mL). A negative result must be combined with clinical observations, patient history, and epidemiological information. The expected result is Negative.  Fact Sheet for Patients:   EntrepreneurPulse.com.au  Fact Sheet for Healthcare Providers:  IncredibleEmployment.be  This test is no t yet approved or cleared by the [place] FDA and  has been authorized for detection and/or diagnosis of SARS-CoV-2 by FDA under an Emergency Use Authorization (EUA). This EUA will remain  in effect (meaning this test can be used) for the duration of the COVID-19 declaration under Section 564(b)(1) of the Act, 21 U.S.C.section 360bbb-3(b)(1), unless the authorization is terminated  or revoked sooner.       Influenza A by PCR NEGATIVE NEGATIVE Final   Influenza B by PCR NEGATIVE NEGATIVE Final    Comment: (NOTE) The Xpert Xpress SARS-CoV-2/FLU/RSV plus assay is intended as an aid in the diagnosis of influenza from Nasopharyngeal swab  specimens and should not be used as a sole basis for treatment. Nasal washings and aspirates are unacceptable for Xpert Xpress SARS-CoV-2/FLU/RSV testing.  Fact Sheet for Patients: EntrepreneurPulse.com.au  Fact Sheet for Healthcare Providers: IncredibleEmployment.be  This test is not yet approved or cleared by the [place] FDA and has been authorized for detection and/or diagnosis of SARS-CoV-2 by FDA under an Emergency Use Authorization (EUA). This EUA will remain in effect (meaning this test can be used) for the duration of the COVID-19 declaration under Section 564(b)(1) of the Act, 21 U.S.C. section 360bbb-3(b)(1), unless the authorization is terminated or revoked.  Performed at [facility], [address], [place], [place] [address]          Radiology Studies: No results found.      Scheduled Meds: . acetaminophen  1,000 mg Oral TID  . amitriptyline  75 mg Oral QHS  . aspirin EC  81 mg Oral Daily  . atorvastatin  80 mg Oral q1800  . enoxaparin (LOVENOX) injection  40 mg Subcutaneous Q24H  . famotidine  20 mg Oral BID  . insulin  aspart  0-5 Units Subcutaneous QHS  . insulin aspart  0-9 Units Subcutaneous TID WC  . insulin aspart protamine- aspart  12 Units Subcutaneous BID WC  . methocarbamol  500 mg Oral QID  . mirtazapine  15 mg Oral QHS  . oxyCODONE  80 mg Oral Q8H  . pantoprazole  40 mg Oral QAC breakfast  . ranolazine  500 mg Oral BID  . silver sulfADIAZINE   Topical Daily  . sucralfate  1 g Oral QID   Continuous Infusions: . sodium chloride 50 mL/hr at 11/09/20 1726     LOS: 4 days    Time spent: 15 minutes    Sidney Ace, MD [facility] Pager [phone]  If 7PM-7AM, please contact night-coverage 11/10/2020, 10:49 AM

## 2020-11-10 NOTE — Progress Notes (Signed)
slept most of the shift pain controlled on schedule analgesic. Vital signs stable. Observation continues.

## 2020-11-10 NOTE — Progress Notes (Signed)
Blood sugar noted to be 63 , pt is alert and oriented*4 able to tolerate food given 237 ml of grape juice recheck in in 15 mins. Given another 237.Observation continues

## 2020-11-10 NOTE — Evaluation (Signed)
Clinical/Bedside Swallow Evaluation Patient Details  Name: Renee Mack MRN: [id number] Date of Birth: October 18, 1965  Today's Date: 11/10/2020 Time: SLP Start Time (ACUTE ONLY): 65 SLP Stop Time (ACUTE ONLY): 1350 SLP Time Calculation (min) (ACUTE ONLY): 30 min  Past Medical History:  Past Medical History:  Diagnosis Date  . Cancer ([place])    vulvular  . Cervical disc disease    a. 01/2018 MRI Cervical spine: Cervical spondylosis with multilevel disc and facet degeneration greatest at C5-6 and C6-7.  Moderate to sev R C5-6, mod L C5-6, and mod bilat C6-7 foraminal stenosis with multilevel mild foraminal stenosis.  Mild C5-6 and mod C6-7 canal stenosis. C6-7 central cord impingement w/ cord flattening.  . Chronic combined systolic (congestive) and diastolic (congestive) heart failure ([place])    a. 12/2017 Echo: EF 30-35%, mid-apicalanteroseptal, ant, and apical AK. Gr1 DD. Mild conc LVH; b. 03/2018 Echo: EF 45-50%, antsept, ant HK. Mild MR. Nl LA size. Nl RV fxn.  Marland Kitchen COPD (chronic obstructive pulmonary disease) ([facility])    not on home oxygen  . Coronary artery disease    a. 12/2017 ACS/PCI: LAD 100p (2.25x26 Resolute Onyx DES), 65m (2.0x12 Resolute Onyx DES), 80d (2.0x15 Resolute Onyx DES), RCA 90p (non-dominant). EF 25-35%. Post-MI course complicated by CGS; b. 07/2023 NSTEMI/subacute thrombosis-->LAD 100 (PTCA + DES x 1); c. 01/2018 NSTEMI/PCI: LM min irregs, LAD 50-60p ISR/hazy (3.5x12 Resolute DES), 1m/d (underexpansion of prior stents-->PTCA). EF 45-50%.  Marland Kitchen GERD (gastroesophageal reflux disease)   . H/O degenerative disc disease   . Hypotension   . Ischemic cardiomyopathy    a. 12/2017 Echo: EF 30-35%; b. 03/2018 Echo: EF 45-50%.  . Myocardial infarction ([place])    a. 12/2017-->DES to LAD x 3.  . Pancreatitis   . Shingles   . Spinal stenosis   . Tobacco abuse   . Ulcer (traumatic) of oral mucosa   . Urinary incontinence   . VIN III (vulvar intraepithelial neoplasia III)    Past Surgical  History:  Past Surgical History:  Procedure Laterality Date  . ABDOMINAL HYSTERECTOMY     partial  . ANTERIOR CERVICAL DECOMP/DISCECTOMY FUSION N/A 11/06/2020   Procedure: ANTERIOR CERVICAL DECOMPRESSION/DISCECTOMY FUSION 2 LEVELS C5-C7;  Surgeon: Meade Maw, MD;  Location: [facility];  Service: Neurosurgery;  Laterality: N/A;  . CORONARY BALLOON ANGIOPLASTY N/A 05/22/2018   Procedure: CORONARY BALLOON ANGIOPLASTY;  Surgeon: Wellington Hampshire, MD;  Location: [place] [facility];  Service: Cardiovascular;  Laterality: N/A;  . CORONARY STENT INTERVENTION N/A 12/26/2017   Procedure: CORONARY STENT INTERVENTION;  Surgeon: Wellington Hampshire, MD;  Location: [place] [facility];  Service: Cardiovascular;  Laterality: N/A;  . CORONARY STENT INTERVENTION N/A 02/08/2018   Procedure: CORONARY STENT INTERVENTION;  Surgeon: Nelva Bush, MD;  Location: [place] [facility];  Service: Cardiovascular;  Laterality: N/A;  . CORONARY/GRAFT ACUTE MI REVASCULARIZATION N/A 12/19/2017   Procedure: Coronary/Graft Acute MI Revascularization;  Surgeon: Wellington Hampshire, MD;  Location: [place] [facility];  Service: Cardiovascular;  Laterality: N/A;  . ECTOPIC PREGNANCY SURGERY Left   . INTRAVASCULAR ULTRASOUND/IVUS N/A 02/08/2018   Procedure: Intravascular Ultrasound/IVUS;  Surgeon: Nelva Bush, MD;  Location: [place] [facility];  Service: Cardiovascular;  Laterality: N/A;  . LEFT HEART CATH AND CORONARY ANGIOGRAPHY N/A 12/19/2017   Procedure: LEFT HEART CATH AND CORONARY ANGIOGRAPHY;  Surgeon: Wellington Hampshire, MD;  Location: [place] [facility];  Service: Cardiovascular;  Laterality: N/A;  . LEFT HEART CATH AND CORONARY ANGIOGRAPHY N/A 12/26/2017  Procedure: LEFT HEART CATH AND CORONARY ANGIOGRAPHY;  Surgeon: Wellington Hampshire, MD;  Location: [place] [facility];  Service: Cardiovascular;  Laterality: N/A;  . LEFT HEART CATH AND CORONARY ANGIOGRAPHY N/A 02/08/2018   Procedure: LEFT HEART CATH AND  CORONARY ANGIOGRAPHY;  Surgeon: Nelva Bush, MD;  Location: [place] [facility];  Service: Cardiovascular;  Laterality: N/A;  . LEFT HEART CATH AND CORONARY ANGIOGRAPHY N/A 05/22/2018   Procedure: LEFT HEART CATH AND CORONARY ANGIOGRAPHY;  Surgeon: Wellington Hampshire, MD;  Location: [place] [facility];  Service: Cardiovascular;  Laterality: N/A;  . LEFT HEART CATH AND CORONARY ANGIOGRAPHY N/A 06/15/2018   Procedure: LEFT HEART CATH AND CORONARY ANGIOGRAPHY;  Surgeon: Wellington Hampshire, MD;  Location: [place] [facility];  Service: Cardiovascular;  Laterality: N/A;  . LYMPHADENECTOMY    . SPLENECTOMY, PARTIAL    . vulvulasectomy     HPI:  Presented to ER secondary to chest pain, L UE weakness/numbness; admitted for management of spinal stenosis of cervical region, s/p emergent C5-7 ACDF (11/06/20). Of note, currently undergoing radiation therapy for vulvular cancer.   Assessment / Plan / Recommendation Clinical Impression  Pt presents s/p ADCF of C5-C7 with coughing at bedside when eating and drinking. Pt states that she begins coughing immediately and "can't quit coughing" when eating and drinking current diet of regular with thin liquids. At bedside, pt's voice is wet and she exhibits an immediate cough when consuming thin liquids, suspect possible pharyngeal swelling. When consuming nectar thick liquids via cup, pt is free of overt s/s of aspiration. To fully assess pharyngeal function recommend instrumental study. In the meantime, pt is agreeable to dysphagia 1 diet with nectar thick liquids via cup, medicine crushed in puree. SLP Visit Diagnosis: Dysphagia, pharyngeal phase (R13.13)    Aspiration Risk  Moderate aspiration risk;Severe aspiration risk    Diet Recommendation Dysphagia 1 (Puree);Nectar-thick liquid   Liquid Administration via: Cup Medication Administration: Crushed with puree Supervision: Patient able to self feed Compensations: Minimize environmental distractions;Slow  rate;Small sips/bites Postural Changes: Seated upright at 90 degrees;Remain upright for at least 30 minutes after po intake    Other  Recommendations Oral Care Recommendations: Oral care BID Other Recommendations: Order thickener from pharmacy;Remove water pitcher;Prohibited food (jello, ice cream, thin soups);Have oral suction available   Follow up Recommendations Inpatient Rehab      Frequency and Duration min 2x/week  2 weeks       Prognosis Prognosis for Safe Diet Advancement: Fair      Swallow Study   General Date of Onset: 11/06/20 HPI: Presented to ER secondary to chest pain, L UE weakness/numbness; admitted for management of spinal stenosis of cervical region, s/p emergent C5-7 ACDF (11/06/20). Of note, currently undergoing radiation therapy for vulvular cancer. Type of Study: Bedside Swallow Evaluation Previous Swallow Assessment: none in chart Diet Prior to this Study: Regular;Thin liquids Temperature Spikes Noted: No Respiratory Status: Room air History of Recent Intubation:  (for procedure) Behavior/Cognition: Alert;Cooperative;Pleasant mood Oral Cavity Assessment: Within Functional Limits Oral Care Completed by SLP: No Oral Cavity - Dentition: Missing dentition;Poor condition Vision: Functional for self-feeding Self-Feeding Abilities: Able to feed self Patient Positioning: Upright in bed Baseline Vocal Quality: Normal Volitional Cough: Strong Volitional Swallow: Able to elicit    Oral/Motor/Sensory Function Overall Oral Motor/Sensory Function: Within functional limits   Ice Chips Ice chips: Not tested   Thin Liquid Thin Liquid: Impaired Presentation: Straw;Cup;Self Fed Pharyngeal  Phase Impairments: Suspected delayed Swallow;Wet Vocal Quality;Cough - Immediate  Nectar Thick Nectar Thick Liquid: Within functional limits Presentation: Self Fed;Cup   Honey Thick Honey Thick Liquid: Not tested   Puree Puree: Within functional limits Presentation: Self  Fed;Spoon   Solid    Ferdinando Lodge B. Rutherford Nail M.S., CCC-SLP, [facility] Speech-Language Pathologist Rehabilitation Services Office [phone]  Solid: Not tested Other Comments: not tested per pt request d/t pain and fear of choking      Sota Hetz 11/10/2020,2:49 PM

## 2020-11-10 NOTE — Progress Notes (Addendum)
Inpatient Diabetes Program Recommendations  AACE/ADA: New Consensus Statement on Inpatient Glycemic Control   Target Ranges:  Prepandial:   less than 140 mg/dL      Peak postprandial:   less than 180 mg/dL (1-2 hours)      Critically ill patients:  140 - 180 mg/dL  Results for JANAYLA, MARIK (MRN [id number]) as of 11/10/2020 08:04  Ref. Range 11/09/2020 07:50 11/09/2020 11:58 11/09/2020 16:20 11/09/2020 21:35 11/09/2020 22:05 11/10/2020 08:02  Glucose-Capillary Latest Ref Range: 70 - 99 mg/dL 131 (H) 86 88 63 (L) 79 218 (H)   Results for TINIE, MCGLOIN (MRN [id number]) as of 11/10/2020 14:29  Ref. Range 03/31/2020 02:23 10/10/2020 04:45 11/06/2020 13:50  Hemoglobin A1C Latest Ref Range: 4.8 - 5.6 % 14.5 (H) 10.4 (H) 11.1 (H)   Review of Glycemic Control  Diabetes history: DM2 Outpatient Diabetes medications: 70/30 18 units BID Current orders for Inpatient glycemic control: 70/30 12 units BID, Novolog 0-9 units TID with meals, Novolog 0-5 units QHS  Inpatient Diabetes Program Recommendations:    Insulin: Noted glucose down to 63 mg/dl at 21:35 on 11/09/20 and patient only ate 50% of supper (per chart). If patient is not eating well, may want to consider decreasing 70/30 to 10 units BID.  Addendum 11/10/20@13 :20-Spoke with patient about diabetes and home regimen for diabetes control. Patient reports being followed by PCP for diabetes management. Patient reports that she use to see Renae Gloss, NP with [facility] [place] [facility] but has not seen her in a long time. Per chart, last office visit with Renae Gloss, NP was 07/26/2018 and patient was prescribed Lantus 70 units BID and Novolog 35 units TID with meals plus additional units for correction; A1C was 12.7% on 07/18/2018.  Patient states that she is currently taking 70/30 insulin and reports that she is taking it consistently. Patient reports that she has really been trying to get DM under better control. She reports checking glucose 3-4 times a day and  reports that her glucose is usually 200-300's range.  Patient states her last A1C was 13.5%. Discussed A1C results (11.1% on 11/06/20) and explained that current A1C indicates an average glucose of 272 mg/dl over the past 2-3 months.  Patient states that she rarely has any issues with hypoglycemia and notes that her body is more use to hyperglycemia. Discussed glucose and A1C goals. Discussed importance of checking CBGs and maintaining good CBG control to prevent long-term and short-term complications. Explained how hyperglycemia leads to damage within blood vessels which lead to the common complications seen with uncontrolled diabetes. Stressed to the patient the importance of improving glycemic control to prevent further complications from uncontrolled diabetes especially following surgery. Encouraged patient to try to get re-established with [facility] so they can work with her to get DM under better control.  Asked patient to take insulin as prescribed at discharge, check glucose 3-4 times per day, call and see if she can get re-established with [facility], and keep in contact with PCP regarding glucose control after discharge.  Patient verbalized understanding of information discussed and reports no further questions at this time related to diabetes. Of note, patient had lunch tray on bedside table and was trying to eat. Patient took about 5 bites of food while I was in the room and she got choked on every bite she took. She reports that she is not able to eat because she keeps getting choked on everything. She had dietary take her tray as she did not want to try  to eat anymore because her throat hurt. Patient asked that I let her nurse know what she needed something for pain in her throat.  Sent Adeyemi, LPN communication regarding patient's pain, noted choking while at bedside (may need speech therapy consult), and patient request for pain medication.   Thanks, Barnie Alderman, RN, MSN,  CDE Diabetes Coordinator Inpatient Diabetes Program [phone] (Team Pager from 8am to 5pm)

## 2020-11-10 NOTE — Progress Notes (Signed)
Pt has not voided overnight bladder scan shows >999 says she has the urge but not passing. NP informed in and out cath ordered. See chart for uotput

## 2020-11-10 NOTE — Care Management Important Message (Signed)
Important Message  Patient Details  Name: Margaret Cockerill MRN: [id number] Date of Birth: 08/06/1965   Medicare Important Message Given:  Yes     Dannette Barbara 11/10/2020, 12:06 PM

## 2020-11-10 NOTE — Progress Notes (Signed)
Physical Therapy Treatment Patient Details Name: Renee Mack MRN: [id number] DOB: 1966/02/05 Today's Date: 11/10/2020    History of Present Illness presented to ER secondary to chest pain, L UE weakness/numbness; admitted for management of spinal stenosis of cervical region, s/p emergent C5-7 ACDF (11/06/20). Of note, currently undergoing radiation therapy for vulvular cancer.    PT Comments    Patient generally lethargic this session, frequently closing eyes and nodding to sleep without constant stimulation from therapist.  Mild dizziness reported with transition to upright; associated drop in BP noted (see vitals flowsheet for details).  Mild improvement in L LE strength; L wrist/finger extension remains grossly flaccid. Very limited insight into overall functional deficits and level of assist required; continue to strongly recommend transition to CIR at discharge.     Follow Up Recommendations  CIR     Equipment Recommendations  Rolling walker with 5" wheels    Recommendations for Other Services       Precautions / Restrictions Precautions Precautions: Fall;Cervical Restrictions Weight Bearing Restrictions: No    Mobility  Bed Mobility Overal bed mobility: Needs Assistance Bed Mobility: Supine to Sit;Sit to Supine     Supine to sit: Supervision Sit to supine: Supervision   General bed mobility comments: mobilizes bilat LEs and trunk without physical assist from therapist; does require use of bedrails to complete.  Absent recall/awareness of cervical precautions    Transfers Overall transfer level: Needs assistance Equipment used: Rolling walker (2 wheeled) Transfers: Sit to/from Stand Sit to Stand: Min assist         General transfer comment: tends to pull on RW, requires L UE placement on RW prior to lift off  Ambulation/Gait Ambulation/Gait assistance: Min assist Gait Distance (Feet):  (5' x2) Assistive device: Rolling walker (2 wheeled)       General  Gait Details: step to gait pattern; slow, guarded gait pattern, poor balance reactions.  Increased time/effort for L UE placement on RW   Stairs             Wheelchair Mobility    Modified Rankin (Stroke Patients Only)       Balance Overall balance assessment: Needs assistance Sitting-balance support: No upper extremity supported;Feet supported Sitting balance-Leahy Scale: Fair     Standing balance support: Bilateral upper extremity supported Standing balance-Leahy Scale: Fair                              Cognition Arousal/Alertness: Lethargic Behavior During Therapy: WFL for tasks assessed/performed Overall Cognitive Status: Within Functional Limits for tasks assessed                                        Exercises Other Exercises Other Exercises: Toilet transfer, SPT with RW, min assist; sit/stand from [facility] with RW, min assist; standing balance for hygiene/peri-care, min assist.  Max assist to pull pants over hips due to limited functional use of L UE Other Exercises: L LE strength grossly 3/5 throughout, almost able to mobilize full-range against gravity.  L UE remains grossly flaccid in wrist extension, finger extension; may benefit from resting hand splint for joint protection/positioning in long-term    General Comments        Pertinent Vitals/Pain Pain Assessment: 0-10 Pain Score: 5  Pain Location: anterior neck Pain Descriptors / Indicators: Guarding;Grimacing;Moaning;Discomfort Pain Intervention(s): Limited activity within patient's tolerance;Monitored  during session;Premedicated before session;Repositioned    Home Living                      Prior Function            PT Goals (current goals can now be found in the care plan section) Acute Rehab PT Goals Patient Stated Goal: to get back home PT Goal Formulation: With patient Time For Goal Achievement: 11/21/20 Potential to Achieve Goals: Fair Progress towards  PT goals: Progressing toward goals    Frequency    7X/week      PT Plan Current plan remains appropriate    Co-evaluation              AM-PAC PT "6 Clicks" Mobility   Outcome Measure  Help needed turning from your back to your side while in a flat bed without using bedrails?: None Help needed moving from lying on your back to sitting on the side of a flat bed without using bedrails?: None Help needed moving to and from a bed to a chair (including a wheelchair)?: A Little Help needed standing up from a chair using your arms (e.g., wheelchair or bedside chair)?: A Little Help needed to walk in hospital room?: A Little Help needed climbing 3-5 steps with a railing? : A Lot 6 Click Score: 19    End of Session Equipment Utilized During Treatment: Gait belt Activity Tolerance: Patient tolerated treatment well Patient left: in bed;with call bell/phone within reach;with bed alarm set Nurse Communication: Mobility status PT Visit Diagnosis: Muscle weakness (generalized) (M62.81);Difficulty in walking, not elsewhere classified (R26.2)     Time: 8413-2440 PT Time Calculation (min) (ACUTE ONLY): 25 min  Charges:  $Gait Training: 8-22 mins $Therapeutic Activity: 8-22 mins                    Shareef Eddinger H. Owens Shark, PT, DPT, NCS 11/10/20, 9:34 AM [phone]

## 2020-11-11 ENCOUNTER — Inpatient Hospital Stay: Payer: Medicare Other

## 2020-11-11 DIAGNOSIS — M4802 Spinal stenosis, cervical region: Secondary | ICD-10-CM | POA: Diagnosis not present

## 2020-11-11 LAB — BLOOD GAS, ARTERIAL
Acid-Base Excess: 0.2 mmol/L (ref 0.0–2.0)
Bicarbonate: 26 mmol/L (ref 20.0–28.0)
FIO2: 0.28
O2 Saturation: 96.6 %
Patient temperature: 37
pCO2 arterial: 46 mmHg (ref 32.0–48.0)
pH, Arterial: 7.36 (ref 7.350–7.450)
pO2, Arterial: 90 mmHg (ref 83.0–108.0)

## 2020-11-11 LAB — COMPREHENSIVE METABOLIC PANEL
ALT: 67 U/L — ABNORMAL HIGH (ref 0–44)
AST: 115 U/L — ABNORMAL HIGH (ref 15–41)
Albumin: 3.5 g/dL (ref 3.5–5.0)
Alkaline Phosphatase: 224 U/L — ABNORMAL HIGH (ref 38–126)
Anion gap: 14 (ref 5–15)
BUN: 9 mg/dL (ref 6–20)
CO2: 25 mmol/L (ref 22–32)
Calcium: 9 mg/dL (ref 8.9–10.3)
Chloride: 98 mmol/L (ref 98–111)
Creatinine, Ser: 0.63 mg/dL (ref 0.44–1.00)
GFR, Estimated: >60 mL/min (ref >60)
Glucose, Bld: 264 mg/dL — ABNORMAL HIGH (ref 70–99)
Potassium: 3.6 mmol/L (ref 3.5–5.1)
Sodium: 137 mmol/L (ref 135–145)
Total Bilirubin: 1.3 mg/dL — ABNORMAL HIGH (ref 0.3–1.2)
Total Protein: 8.2 g/dL — ABNORMAL HIGH (ref 6.5–8.1)

## 2020-11-11 LAB — CBC WITH DIFFERENTIAL/PLATELET
Abs Immature Granulocytes: 0.04 10*3/uL (ref 0.00–0.07)
Basophils Absolute: 0 10*3/uL (ref 0.0–0.1)
Basophils Relative: 0 %
Eosinophils Absolute: 0.2 10*3/uL (ref 0.0–0.5)
Eosinophils Relative: 1 %
HCT: 45 % (ref 36.0–46.0)
Hemoglobin: 14.7 g/dL (ref 12.0–15.0)
Immature Granulocytes: 0 %
Lymphocytes Relative: 4 %
Lymphs Abs: 0.5 10*3/uL — ABNORMAL LOW (ref 0.7–4.0)
MCH: 30.7 pg (ref 26.0–34.0)
MCHC: 32.7 g/dL (ref 30.0–36.0)
MCV: 93.9 fL (ref 80.0–100.0)
Monocytes Absolute: 0.5 10*3/uL (ref 0.1–1.0)
Monocytes Relative: 3 %
Neutro Abs: 12.6 10*3/uL — ABNORMAL HIGH (ref 1.7–7.7)
Neutrophils Relative %: 92 %
Platelets: 232 10*3/uL (ref 150–400)
RBC: 4.79 MIL/uL (ref 3.87–5.11)
RDW: 12.8 % (ref 11.5–15.5)
WBC: 13.8 10*3/uL — ABNORMAL HIGH (ref 4.0–10.5)
nRBC: 0 % (ref 0.0–0.2)

## 2020-11-11 LAB — APTT: aPTT: 33 seconds (ref 24–36)

## 2020-11-11 LAB — PROCALCITONIN: Procalcitonin: 0.16 ng/mL

## 2020-11-11 LAB — LACTIC ACID, PLASMA
Lactic Acid, Venous: 2 mmol/L (ref 0.5–1.9)
Lactic Acid, Venous: 1.7 mmol/L (ref 0.5–1.9)

## 2020-11-11 LAB — GLUCOSE, CAPILLARY
Glucose-Capillary: 220 mg/dL — ABNORMAL HIGH (ref 70–99)
Glucose-Capillary: 187 mg/dL — ABNORMAL HIGH (ref 70–99)
Glucose-Capillary: 98 mg/dL (ref 70–99)
Glucose-Capillary: 96 mg/dL (ref 70–99)

## 2020-11-11 LAB — MAGNESIUM: Magnesium: 1.5 mg/dL — ABNORMAL LOW (ref 1.7–2.4)

## 2020-11-11 LAB — PROTIME-INR
INR: 1.1 (ref 0.8–1.2)
Prothrombin Time: 14.6 seconds (ref 11.4–15.2)

## 2020-11-11 MED ORDER — INSULIN ASPART PROT & ASPART (70-30 MIX) 100 UNIT/ML ~~LOC~~ SUSP
10.0000 [IU] | Freq: Two times a day (BID) | SUBCUTANEOUS | Status: DC
  Administered 2020-11-12 – 2020-11-13 (×3): 10 [IU] via SUBCUTANEOUS
  Filled 2020-11-11 (×3): qty 10

## 2020-11-11 MED ORDER — VANCOMYCIN HCL 1500 MG/300ML IV SOLN
1500.0000 mg | INTRAVENOUS | Status: DC
  Administered 2020-11-12: 1500 mg via INTRAVENOUS
  Filled 2020-11-11 (×2): qty 300

## 2020-11-11 MED ORDER — ACETAMINOPHEN 325 MG PO TABS: 650.0000 mg | ORAL_TABLET | Freq: Four times a day (QID) | ORAL | Status: DC | PRN

## 2020-11-11 MED ORDER — VANCOMYCIN HCL 1000 MG/200ML IV SOLN
1000.0000 mg | Freq: Once | INTRAVENOUS | Status: AC
  Administered 2020-11-11: 1000 mg via INTRAVENOUS
  Filled 2020-11-11: qty 200

## 2020-11-11 MED ORDER — SODIUM CHLORIDE 0.9 % IV SOLN
1.0000 g | INTRAVENOUS | Status: DC
  Filled 2020-11-11: qty 10

## 2020-11-11 MED ORDER — LACTATED RINGERS IV BOLUS (SEPSIS)
250.0000 mL | Freq: Once | INTRAVENOUS | Status: AC
  Administered 2020-11-11: 250 mL via INTRAVENOUS

## 2020-11-11 MED ORDER — LACTATED RINGERS IV BOLUS (SEPSIS)
500.0000 mL | Freq: Once | INTRAVENOUS | Status: AC
  Administered 2020-11-11: 500 mL via INTRAVENOUS

## 2020-11-11 MED ORDER — LACTATED RINGERS IV BOLUS (SEPSIS)
1000.0000 mL | Freq: Once | INTRAVENOUS | Status: AC
  Administered 2020-11-11: 1000 mL via INTRAVENOUS

## 2020-11-11 MED ORDER — SODIUM CHLORIDE 0.9 % IV SOLN
2.0000 g | Freq: Three times a day (TID) | INTRAVENOUS | Status: DC
  Administered 2020-11-11 – 2020-11-13 (×5): 2 g via INTRAVENOUS
  Filled 2020-11-11 (×7): qty 2

## 2020-11-11 MED ORDER — SODIUM CHLORIDE 0.9 % IV SOLN
2.0000 g | Freq: Once | INTRAVENOUS | Status: AC
  Administered 2020-11-11: 2 g via INTRAVENOUS
  Filled 2020-11-11: qty 2

## 2020-11-11 MED ORDER — SENNOSIDES-DOCUSATE SODIUM 8.6-50 MG PO TABS
1.0000 | ORAL_TABLET | Freq: Two times a day (BID) | ORAL | Status: DC
  Administered 2020-11-11 – 2020-11-14 (×7): 1 via ORAL
  Filled 2020-11-11 (×7): qty 1

## 2020-11-11 MED ORDER — BISACODYL 10 MG RE SUPP: 10.0000 mg | Freq: Every day | RECTAL | Status: DC | PRN

## 2020-11-11 MED ORDER — TAMSULOSIN HCL 0.4 MG PO CAPS
0.4000 mg | ORAL_CAPSULE | Freq: Every day | ORAL | Status: DC
  Administered 2020-11-11 – 2020-11-13 (×3): 0.4 mg via ORAL
  Filled 2020-11-11 (×3): qty 1

## 2020-11-11 MED ORDER — METRONIDAZOLE 500 MG/100ML IV SOLN
500.0000 mg | Freq: Three times a day (TID) | INTRAVENOUS | Status: DC
  Administered 2020-11-11 – 2020-11-13 (×5): 500 mg via INTRAVENOUS
  Filled 2020-11-11 (×9): qty 100

## 2020-11-11 MED ORDER — AMLODIPINE BESYLATE 5 MG PO TABS
5.0000 mg | ORAL_TABLET | Freq: Every day | ORAL | Status: DC
  Administered 2020-11-11 – 2020-11-13 (×3): 5 mg via ORAL
  Filled 2020-11-11 (×4): qty 1

## 2020-11-11 MED ORDER — POLYETHYLENE GLYCOL 3350 17 G PO PACK
17.0000 g | PACK | Freq: Every day | ORAL | Status: DC
  Administered 2020-11-11 – 2020-11-14 (×4): 17 g via ORAL
  Filled 2020-11-11 (×4): qty 1

## 2020-11-11 NOTE — Progress Notes (Signed)
Modified Barium Swallow Progress Note  Patient Details  Name: Renee Mack MRN: [id number] Date of Birth: 1966/06/13  Today's Date: 11/11/2020  Modified Barium Swallow completed.  Full report located under Chart Review in the Imaging Section.  Brief recommendations include the following:  Clinical Impression  Pt is at an extremely high risk of aspiration with all PO intake. At this time, pt presents severely altered and unable to follow any compensatory strategies, thus conservative recommendation is NPO with ice chips.        Pt demonstrates severe oral phase and moderate sensorimotor pharyngeal phase dysphagia. [name] oral phase is likely related to current cognitive state. It is very disorganized and results in premature spillage of all boluses. While pt's pharyngeal phase is impacted by pharyngeal edema of ACDF, she displays a significant delay in swallow initiation at the level of the pyriforms. As the epiglottic deflects and abuts the posterior pharyngeal wall edema, nectar and thin liquids are penetrated to the level of the cords with no ability to eject penetrates. Pt also displays motor impairments as she has wide spreading pharyngeal residue with nectar thick liquids and thin liquids but has a moderate amount of vallecular residue with puree. Pharyngeal residue doesn't decrease with additional swallow.        As mentioned, pt appears more altered than during previous day. Secure chat sent to pt's MD and nurse making them aware.  RECOMMEND REPEAT STUDY IN 3-4 DAYS.    Swallow Evaluation Recommendations       SLP Diet Recommendations: NPO (may have ice chips)       Medication Administration: Via alternative means       Compensations: Minimize environmental distractions;Slow rate;Small sips/bites   Postural Changes: Seated upright at 90 degrees   Oral Care Recommendations: Oral care QID;Oral care before and after PO      ADDENDUM: Rapid Response called at 0851; chest  x-ray revealed Right base infiltrate consistent with pneumonia. Aspiration could present this fashion. Bibasilar atelectasis.   Oshua Mcconaha B. Rutherford Nail M.S., CCC-SLP, [place] Office [phone]  Renee Mack 11/11/2020,9:52 AM

## 2020-11-11 NOTE — Progress Notes (Signed)
Patient was yellow MEWS with start of shift vital signs. Re-checked in 1 hour and patient became a Red MEWS of 5 with Altered mental status from baseline but Alert, Temp of 103, BP elevated, HR in the 130's, O2 at 88 put on 2L .  Called Rapid response, Camera operator and MD notified. Charge nurse came in and assisted Nurse at bedside.. MD at bedside when rapid response showed up and rapid response was not needed. Monitored vitals . Started Code Sepsis bundle.  When vitals were rechecked , Fever broke, tachycardia came down and patient was stable. Mental status was back to baseline A/Ox4. Patient states at this time when "she spikes a fever she gets very confused and speaks off the wall". Patient now resting comfortably.

## 2020-11-11 NOTE — Progress Notes (Signed)
PROGRESS NOTE    Renee Mack  XHB:[id number] DOB: 03-Jul-1965 DOA: 11/06/2020 PCP: Olin Hauser, DO   Brief Narrative:  55 y.o. female with medical history significant of HLD, DM, CAD with DES, sCHF with EF 45-50%, vulvar CA (s/p of surgery and radiation therapy), COPD, GERD, depression with anxiety, former smoker, pancreatitis, DKA, who presents with chest pain and left arm weakness.  Pt states that her chest pain started at about 4:00 AM today. Her chest pain is located in the upper chest close to the neck, 8 out of 10 severity, sharp, radiating to the left arm.  Patient has some mild shortness of breath.  Patient has mild cough, no fever or chills. Patient states that she coughed up a little bright red blood, just one episode.  Patient also reports left arm weakness and left toe numbness.  He has some left upper arm pain.   Found on MRI imaging survey to have degenerative changes of cervical spine most pronounced at C6-7.  Moderate severe spinal canal stenosis with mass-effect on the cord which is progressed from prior MRI.  Patient was emergently taken to the operating room with neurosurgery on 5/19 for cervical discectomy and fusion.  Tolerated procedure well.  Postop day 1 evaluated by neurosurgery.  Felt to be stable postoperatively.   About issues with postoperative pain control.  This could be difficult to achieve given her opioid tolerance  5/23: Pain control appears to be improved but patient remains very lethargic including during physical therapy evaluation.  He is currently refusing CIR.  Per PT notes patient has very limited insight into functional deficits and level of assistance required.  In my opinion home even with home health services will not be an optimal disposition.  I have communicated my concerns to the patient.  I have sent message to the care team as well as the neurosurgical consultant for's recommendations.  5/24: More confused this AM.  Became febrile and  acutely tachycardic.  Responded to bedside.  Initial working diagnosis is sepsis secondary to aspiration pneumonia.  Broad-spectrum antibiotics initiated.     Assessment & Plan:   Principal Problem:   Spinal stenosis of cervical region Active Problems:   GERD (gastroesophageal reflux disease)   Hyperlipidemia LDL goal <70   Chest pain   Chronic systolic heart failure (HCC)   CAD (coronary artery disease)   Anxiety associated with depression   Vulvar cancer ([place])   Diabetes mellitus without complication (HCC)   Hemoptysis   Dilated bile duct  Presumed aspiration pneumonia Severe sepsis secondary to above Acute metabolic encephalopathy secondary to above On 5/24 patient developed acute onset of fever and tachycardia, altered mentation Chest x-ray reveals a right base infiltrate consistent with pneumonia Unable to rule out other septic sources at this time given recent surgery Tachycardic but hemodynamically stable Fever broke with Tylenol Plan: Sepsis bundle Lactated Ringer's 30 mg/kg bolus Followed by normal saline 125 cc/h IV vancomycin, pharmacy dosing IV cefepime, pharmacy dosing Stat labs Trend lactic acid Panculture Monitor vitals and fever curve  Spinal stenosis of cervical region  Patient's left arm weakness is likely due to cervical spine stenosis.  MRI of C spin showed degenerative disc disease C5-C7, with mass-effect on spinal cord.   Dr. Izora Ribas of neurosurgery is consulted Status post cervical discectomy with fusion 5/19 Moderate-severe post operative pain Improving over interval Plan: Continue SQ lovenox PT and OT evaluations, recommend CIR TOC and CIR consult As needed pain control IV narcotics discontinued CIR will  consult  GERD (gastroesophageal reflux disease) Continue home Protonix and Pepcid  Hyperlipidemia LDL goal <70 Continue home Lipitor  Chest pain and history of CAD: S/p of DES 2019.  Troponin 12, 11.   Her chest pain is  close to the front neck, may be related to the spinal cord stenosis Not indicative of ACS Plan: Cardiology consulted, recommendations appreciated Continue aspirin 81 daily Hold Brilinta postoperatively for at least 7 days from date of surgery (5/19.,  Earliest restart date would be 6/72)  Chronic systolic heart failure ([place])  2D echo on 03/25/2018 showed EF of 45-50%.   Patient does not have leg edema or JVD.   CHF seem to be compensated. -Check BNP --> 58.7  Anxiety associated with depression -Continue home medications  Diabetes mellitus without complication (HCC)  Recent A1c 10.4, poorly controlled.   Patient still taking 70/30 insulin. Improved control over interval  Patient not eating well Plan:. Continue 70/30 insulin.  Dose decreased to 10 units twice daily Sensitive sliding scale before meals and at bedtime Diabetes coordinator consult Carb modified diet  Hemoptysis CT angiograms negative for aortic dissection or central PE -Hold Brilinta -Continue aspirin  Dilated bile duct  CT angiogram incidentally showed extrahepatic bile duct dilation.   No GI symptoms.  Liver function normal. -Monitoring for any GI symptoms  Vulvar cancer  s/p of surgery and XRT. -pt is high dose pain med,  OxyContin 80 mg twice daily and as needed oxycodone 30 mg every 8 hours   DVT prophylaxis: SQ Lovenox  code Status: Full Family Communication: Daughter [name] [phone] on 5/24 Disposition Plan: Status is: Inpatient  Remains inpatient appropriate because:Inpatient level of care appropriate due to severity of illness   Dispo: The patient is from: Home              Anticipated d/c is to: CIR              Patient currently is not medically stable to d/c.   Difficult to place patient No  Postoperative day #5 status post anterior cervical discectomy and fusion.  Acute onset fever and tachycardia this morning associated with altered mentation.  Suspect evolving sepsis.   Suspected source aspiration pneumonia.  On broad-spectrum IV antibiotics.  Disposition plan pending.     Level of care: Progressive Cardiac  Consultants:   Neurosurgery  Cardiology  Procedures:  Anterior cervical discectomy and fusion, 5/19  Antimicrobials:   None   Subjective: Patient seen and examined.  Hemodynamically stable but more confused this morning.  Objective: Vitals:   11/11/20 0423 11/11/20 0735 11/11/20 0849 11/11/20 0957  BP: (!) 114/55 (!) 148/87 (!) 148/86 137/64  Pulse: 86 (!) 127 (!) 132 (!) 127  Resp: 20 16 17 17   Temp: 98 F (36.7 C) 100.2 F (37.9 C) (!) 103.2 F (39.6 C) 100 F (37.8 C)  TempSrc: Oral  Oral Oral  SpO2: (!) 89% (!) 89% (!) 86% 97%  Weight:      Height:        Intake/Output Summary (Last 24 hours) at 11/11/2020 1039 Last data filed at 11/11/2020 0100 Gross per 24 hour  Intake 120 ml  Output 1300 ml  Net -1180 ml   Filed Weights   11/06/20 0621 11/06/20 1702 11/07/20 0330  Weight: 58.5 kg 59 kg 58 kg    Examination:  General exam: No acute distress.  Confused Respiratory system: Right lower lobe crackles.  Normal work of breathing.  Room air Cardiovascular system: S1 &  S2 heard, RRR. No JVD, murmurs, rubs, gallops or clicks. No pedal edema. Gastrointestinal system: Abdomen is nondistended, soft and nontender. No organomegaly or masses felt. Normal bowel sounds heard. Central nervous system: Confused.  Alert, oriented to person and place.  No focal deficits  extremities: Decreased power left upper extremity.  Limited range of motion Skin: No rashes, lesions or ulcers Psychiatry: Judgement and insight appear impaired. Mood & affect flattened.     Data Reviewed: I have personally reviewed following labs and imaging studies  CBC: Recent Labs  Lab 11/06/20 0629 11/07/20 0520 11/09/20 1105 11/11/20 0859  WBC 6.7 12.7* 6.1 13.8*  NEUTROABS 4.0  --  3.3 12.6*  HGB 14.3 12.7 14.3 14.7  HCT 42.4 37.6 44.1 45.0   MCV 91.4 91.7 95.5 93.9  PLT 298 278 221 454   Basic Metabolic Panel: Recent Labs  Lab 11/06/20 0629 11/07/20 0520 11/09/20 1105 11/11/20 0859  NA 137 135 138 137  K 4.1 4.1 3.5 3.6  CL 102 104 106 98  CO2 25 20* 22 25  GLUCOSE 281* 370* 113* 264*  BUN 9 12 10 9   CREATININE 0.60 0.51 0.46 0.63  CALCIUM 9.4 8.7* 8.7* 9.0  MG 1.7  --   --  1.5*   GFR: Estimated Creatinine Clearance: 64.3 mL/min (by C-G formula based on SCr of 0.63 mg/dL). Liver Function Tests: Recent Labs  Lab 11/06/20 0629 11/11/20 0859  AST 13* 115*  ALT 12 67*  ALKPHOS 98 224*  BILITOT 0.5 1.3*  PROT 7.5 8.2*  ALBUMIN 3.5 3.5   No results for input(s): LIPASE, AMYLASE in the last 168 hours. No results for input(s): AMMONIA in the last 168 hours. Coagulation Profile: Recent Labs  Lab 11/06/20 0629  INR 1.0   Cardiac Enzymes: No results for input(s): CKTOTAL, CKMB, CKMBINDEX, TROPONINI in the last 168 hours. BNP (last 3 results) No results for input(s): PROBNP in the last 8760 hours. HbA1C: No results for input(s): HGBA1C in the last 72 hours. CBG: Recent Labs  Lab 11/10/20 1134 11/10/20 1203 11/10/20 1656 11/10/20 2118 11/11/20 0739  GLUCAP 165* 143* 102* 175* 220*   Lipid Profile: No results for input(s): CHOL, HDL, LDLCALC, TRIG, CHOLHDL, LDLDIRECT in the last 72 hours. Thyroid Function Tests: No results for input(s): TSH, T4TOTAL, FREET4, T3FREE, THYROIDAB in the last 72 hours. Anemia Panel: No results for input(s): VITAMINB12, FOLATE, FERRITIN, TIBC, IRON, RETICCTPCT in the last 72 hours. Sepsis Labs: Recent Labs  Lab 11/11/20 0859  PROCALCITON 0.16    Recent Results (from the past 240 hour(s))  Resp Panel by RT-PCR (Flu A&B, Covid) Nasopharyngeal Swab     Status: None   Collection Time: 11/06/20  7:17 AM   Specimen: Nasopharyngeal Swab; Nasopharyngeal(NP) swabs in vial transport medium  Result Value Ref Range Status   SARS Coronavirus 2 by RT PCR NEGATIVE NEGATIVE  Final    Comment: (NOTE) SARS-CoV-2 target nucleic acids are NOT DETECTED.  The SARS-CoV-2 RNA is generally detectable in upper respiratory specimens during the acute phase of infection. The lowest concentration of SARS-CoV-2 viral copies this assay can detect is 138 copies/mL. A negative result does not preclude SARS-Cov-2 infection and should not be used as the sole basis for treatment or other patient management decisions. A negative result may occur with  improper specimen collection/handling, submission of specimen other than nasopharyngeal swab, presence of viral mutation(s) within the areas targeted by this assay, and inadequate number of viral copies(<138 copies/mL). A negative result must be  combined with clinical observations, patient history, and epidemiological information. The expected result is Negative.  Fact Sheet for Patients:  EntrepreneurPulse.com.au  Fact Sheet for Healthcare Providers:  IncredibleEmployment.be  This test is no t yet approved or cleared by the [place] FDA and  has been authorized for detection and/or diagnosis of SARS-CoV-2 by FDA under an Emergency Use Authorization (EUA). This EUA will remain  in effect (meaning this test can be used) for the duration of the COVID-19 declaration under Section 564(b)(1) of the Act, 21 U.S.C.section 360bbb-3(b)(1), unless the authorization is terminated  or revoked sooner.       Influenza A by PCR NEGATIVE NEGATIVE Final   Influenza B by PCR NEGATIVE NEGATIVE Final    Comment: (NOTE) The Xpert Xpress SARS-CoV-2/FLU/RSV plus assay is intended as an aid in the diagnosis of influenza from Nasopharyngeal swab specimens and should not be used as a sole basis for treatment. Nasal washings and aspirates are unacceptable for Xpert Xpress SARS-CoV-2/FLU/RSV testing.  Fact Sheet for Patients: EntrepreneurPulse.com.au  Fact Sheet for Healthcare  Providers: IncredibleEmployment.be  This test is not yet approved or cleared by the [place] FDA and has been authorized for detection and/or diagnosis of SARS-CoV-2 by FDA under an Emergency Use Authorization (EUA). This EUA will remain in effect (meaning this test can be used) for the duration of the COVID-19 declaration under Section 564(b)(1) of the Act, 21 U.S.C. section 360bbb-3(b)(1), unless the authorization is terminated or revoked.  Performed at [facility], [address], [place], [place] [address]          Radiology Studies: [facility] Chest [place] 1 View  Result Date: 11/11/2020 CLINICAL DATA:  Aspiration.  Shortness of breath. EXAM: PORTABLE CHEST 1 VIEW COMPARISON:  10/10/2020. FINDINGS: Heart size normal. Right base infiltrate consistent with pneumonia. Aspiration could present in this fashion. Bibasilar atelectasis. No pleural effusion or pneumothorax. Barium noted the stomach. Prior cervical spine fusion. IMPRESSION: Right base infiltrate consistent with pneumonia. Aspiration could present this fashion. Bibasilar atelectasis. Electronically Signed   By: Marcello Moores  Register   On: 11/11/2020 09:09        Scheduled Meds: . acetaminophen  1,000 mg Oral TID  . amitriptyline  75 mg Oral QHS  . amLODipine  5 mg Oral Daily  . aspirin EC  81 mg Oral Daily  . atorvastatin  80 mg Oral q1800  . enoxaparin (LOVENOX) injection  40 mg Subcutaneous Q24H  . famotidine  20 mg Oral BID  . insulin aspart  0-5 Units Subcutaneous QHS  . insulin aspart  0-9 Units Subcutaneous TID WC  . insulin aspart protamine- aspart  12 Units Subcutaneous BID WC  . methocarbamol  500 mg Oral TID  . mirtazapine  15 mg Oral QHS  . oxyCODONE  80 mg Oral Q8H  . pantoprazole  40 mg Oral QAC breakfast  . polyethylene glycol  17 g Oral Daily  . ranolazine  500 mg Oral BID  . senna-docusate  1 tablet Oral BID  . silver sulfADIAZINE   Topical Daily  . sucralfate  1 g Oral  QID  . tamsulosin  0.4 mg Oral QPC supper   Continuous Infusions: . sodium chloride 125 mL/hr at 11/11/20 0906  . lactated ringers     And  . lactated ringers    . metronidazole    . vancomycin       LOS: 5 days    Time spent: 25 minutes    Sidney Ace, MD [facility] Pager [phone]  If 7PM-7AM, please contact night-coverage 11/11/2020, 10:39 AM

## 2020-11-11 NOTE — Consult Note (Signed)
Pharmacy Antibiotic Note  Renee Mack is a 55 y.o. female admitted on 11/06/2020 with suspected evolving sepsis.  Patient had acute onset fever and tachycardia this morning associated with altered mentation. Pharmacy has been consulted for Vancomycin and Cefepime dosing.  Plan: Ordered loading dose of Vancomycin 1g IV x 1, and will order maintenance dose of: Vancomycin 1500 mg IV Q 24 hrs. Goal AUC 400-550. Expected AUC: 530.5 Expected Css: 9.7 SCr used: 0.8(actual 0.63)  Cefepime 2g IV Q 8 hrs  Height: 5\' 1"  (154.9 cm) Weight: 58 kg (127 lb 12.8 oz) IBW/kg (Calculated) : 47.8  Temp (24hrs), Avg:99.3 F (37.4 C), Min:98 F (36.7 C), Max:103.2 F (39.6 C)  Recent Labs  Lab 11/06/20 0629 11/07/20 0520 11/09/20 1105 11/11/20 0859  WBC 6.7 12.7* 6.1 13.8*  CREATININE 0.60 0.51 0.46  --     Estimated Creatinine Clearance: 64.3 mL/min (by C-G formula based on SCr of 0.46 mg/dL).    Allergies  Allergen Reactions  . Bee Venom Itching, Shortness Of Breath and Swelling  . Metformin And Related Shortness Of Breath and Swelling  . Darvon [Propoxyphene] Itching  . Gabapentin Swelling  . Nsaids Other (See Comments)    Ulcers   . Tramadol Hives  . Contrast Media [Iodinated Diagnostic Agents] Rash    If you benadryl, and steroids she is able to take the contrast per pt    Antimicrobials this admission: Cefepime 5/24 >>  Vancomycin 5/24 >>   Microbiology results: 5/24 BCx: pending  Thank you for allowing pharmacy to be a part of this patient's care.  Lance Coon A Shahira Fiske 11/11/2020 9:43 AM

## 2020-11-11 NOTE — Progress Notes (Signed)
Chaplain Maggie made initial visitation to follow up on rapid response call. Made room for introductions and space for listening. Patient requested prayer. Chaplain available for follow up and continued care.

## 2020-11-11 NOTE — Progress Notes (Signed)
Elink is following Code Sepsis 

## 2020-11-11 NOTE — Progress Notes (Signed)
  Speech Language Pathology Note  Patient Details Name: Renee Mack MRN: [id number] DOB: June 26, 1965 Today's Date: 11/11/2020 Time:   0830    Pt seen for Modified Barium Swallow study this morning. She was severely altered compared to previous day's interaction. Her responses indicated no understanding of the question, unable to follow basic 1 step directions, when asked, she stated that she did "feel more confused than normal."   Information given to pt's MD and nurse immediately.   Kamrie Fanton B. Rutherford Nail M.S., CCC-SLP, [place] Office [phone]   [name] 11/11/2020, 8:56 AM

## 2020-11-11 NOTE — Progress Notes (Signed)
PIV attempt x3 without success.  IV team order placed. Patient tolerated well.

## 2020-11-11 NOTE — Progress Notes (Signed)
Physical Therapy Treatment Patient Details Name: Renee Mack MRN: [id number] DOB: 03/11/1966 Today's Date: 11/11/2020    History of Present Illness presented to ER secondary to chest pain, L UE weakness/numbness; admitted for management of spinal stenosis of cervical region, s/p emergent C5-7 ACDF (11/06/20). Of note, currently undergoing radiation therapy for vulvular cancer.    PT Comments    Pt continued to be highly motivated to participate with therapy. Reported anterior neck pain 9/10 RN notified at beginning and end of PT of pt request for medication. The patient demonstrated progress in mobility this session, oriented x3, but may have decreased insight into deficits. Supine to sit with supervision and use of bed rails. Fair sitting balance. BP assessed twice, 88/48, HR 83 pt asymptomatic and requesting to use BSC. Stand pivot with minA and RW, cued for hand placement/safety. maxA for pericare due avoid prolonged upright positioning due to BP concerns. The patient was able to transfer to recliner in room ~57ft with RW and CGA. BP assessed in standing, 70/44 HR 83, further ambulation deferred. At end of session and after a few seated exercises, BP 90/50, HR 75 in chair with all needs in reach. The patient and PT spent time discussing discharge options to maximize pt function, mobility and independence, CIR remains appropriate.    Follow Up Recommendations  CIR     Equipment Recommendations  Rolling walker with 5" wheels    Recommendations for Other Services       Precautions / Restrictions Precautions Precautions: Fall;Cervical Restrictions Weight Bearing Restrictions: No    Mobility  Bed Mobility Overal bed mobility: Needs Assistance Bed Mobility: Supine to Sit   Sidelying to sit: Supervision       General bed mobility comments: mobilizes bilat LEs and trunk without physical assist from therapist; does require use of bedrails to complete.  Absent recall/awareness of  cervical precautions    Transfers Overall transfer level: Needs assistance Equipment used: Rolling walker (2 wheeled) Transfers: Sit to/from Stand Sit to Stand: Min guard         General transfer comment: pt able to place LUE this PM, cued for RW safety  Ambulation/Gait             General Gait Details: deferred due to low BP this PM. pt very motivated to perform but aware of safety issues   Stairs             Wheelchair Mobility    Modified Rankin (Stroke Patients Only)       Balance Overall balance assessment: Needs assistance Sitting-balance support: No upper extremity supported;Feet supported Sitting balance-Leahy Scale: Fair     Standing balance support: Bilateral upper extremity supported Standing balance-Leahy Scale: Fair Standing balance comment: patient relying heavily on rolling walker for support in standing, able to perform unilateral manual tasks while standing with RW                            Cognition Arousal/Alertness: Awake/alert Behavior During Therapy: WFL for tasks assessed/performed Overall Cognitive Status: Within Functional Limits for tasks assessed                                 General Comments: oriented to self, place, situation. Time spent discussing discharge options      Exercises Other Exercises Other Exercises: stand pivot to [facility] with CGA and RW, cued for hand placement.  maxA for pericare care in standing due to low BP to avoid prolonged upright positioning Other Exercises: seated LAQ, seated marching, seated ankle pumps x20 bilaterally    General Comments        Pertinent Vitals/Pain Pain Assessment: 0-10 Pain Score: 9  Pain Location: anterior neck Pain Descriptors / Indicators: Guarding;Grimacing;Moaning;Discomfort Pain Intervention(s): Limited activity within patient's tolerance;Monitored during session;Repositioned;Patient requesting pain meds-RN notified    Home Living                       Prior Function            PT Goals (current goals can now be found in the care plan section) Progress towards PT goals: Progressing toward goals    Frequency    7X/week      PT Plan Current plan remains appropriate    Co-evaluation              AM-PAC PT "6 Clicks" Mobility   Outcome Measure  Help needed turning from your back to your side while in a flat bed without using bedrails?: None Help needed moving from lying on your back to sitting on the side of a flat bed without using bedrails?: None Help needed moving to and from a bed to a chair (including a wheelchair)?: A Little Help needed standing up from a chair using your arms (e.g., wheelchair or bedside chair)?: A Little Help needed to walk in hospital room?: A Little Help needed climbing 3-5 steps with a railing? : A Lot 6 Click Score: 19    End of Session Equipment Utilized During Treatment: Gait belt Activity Tolerance: Patient tolerated treatment well;Other (comment);Treatment limited secondary to medical complications (Comment) (limited by low BP) Patient left: in chair;with chair alarm set;with call bell/phone within reach Nurse Communication: Mobility status PT Visit Diagnosis: Muscle weakness (generalized) (M62.81);Difficulty in walking, not elsewhere classified (R26.2)     Time: 3491-7915 PT Time Calculation (min) (ACUTE ONLY): 28 min  Charges:  $Therapeutic Exercise: 8-22 mins $Therapeutic Activity: 8-22 mins                    Lieutenant Diego PT, DPT 3:27 PM,11/11/20

## 2020-11-11 NOTE — Progress Notes (Signed)
  Speech Language Pathology Treatment: Dysphagia  Patient Details Name: Renee Mack MRN: [id number] DOB: [birth year] Today's Date: 11/11/2020 Time: 4098-1191 SLP Time Calculation (min) (ACUTE ONLY): 25 min  Assessment / Plan / Recommendation Clinical Impression  Nursing made this writer aware that pt's mentation was clearing. SLP met with pt and reviewed results of MBSS. [name] swallow dysfunction is multifactorial in that her swallow is severely delayed and that is further complicated by posterior pharyngeal edema. Education provided on risk of aspiration and the resultant pneumonia that pt currently has. Pt rejected conversation about possible plans of care such as medical intervention for edema and CIR follow up. [name] friend present during session and reports that pt "had pneumonia when she got here."   Given the extent of pt's poor health literacy, refusal of medical recommendations, request for "potato soup," current NPO status, recommend Palliative Care consult to establish [place].   If another instrumental swallow study is aligned with pt's GOC, recommend a repeat study in 3-4 days.     HPI HPI: Presented to ER secondary to chest pain, L UE weakness/numbness; admitted for management of spinal stenosis of cervical region, s/p emergent C5-7 ACDF (11/06/20). Of note, currently undergoing radiation therapy for vulvular cancer. 11/11/2020 - -MBSS revealed sensorimotor pharyngeal dysphagia that is further complicated by posterior pharyngeal wall edema - recommend NPO with ice chips allowed.       SLP Plan  Continue with current plan of care       Recommendations  Diet recommendations: NPO (ice chips allowed) Compensations: Minimize environmental distractions;Slow rate;Small sips/bites Postural Changes and/or Swallow Maneuvers: Seated upright 90 degrees                Oral Care Recommendations: Oral care QID;Oral care prior to ice chip/H20 Follow up Recommendations:  (Palliative Care  consult) SLP Visit Diagnosis: Dysphagia, oropharyngeal phase (R13.12) Plan: Continue with current plan of care       GO               Renee Mack B. Rutherford Nail M.S., CCC-SLP, [place] Office [phone]  Renee Mack 11/11/2020, 1:39 PM

## 2020-11-11 NOTE — Progress Notes (Signed)
OT Cancellation Note  Patient Details Name: Renee Mack MRN: [id number] DOB: 1965-11-01   Cancelled Treatment:    Reason Eval/Treat Not Completed: Medical issues which prohibited therapy. Pt with RED MEWS score and rapid being called on unit. OT to hold therapeutic intervention. OT to re-attempt when pt is medically able to participate.   Darleen Crocker, [place], OTR/L , CBIS ascom [phone]  11/11/20, 8:57 AM   11/11/2020, 8:57 AM

## 2020-11-11 NOTE — Consult Note (Signed)
Physical Medicine and Rehabilitation Consult Reason for Consult: Central cord syndrome Referring Physician: Ralene Muskrat, MD   HPI: Renee Mack is a 55 y.o. female who presented with left upper extremity weakness and numbness and underwent emergent C5-C7 ACDF on 11/06/20 for cervical spinal stenosis. Current impairments include left upper extremity weakness, numbness, and concern for aspiration fiven pharyngeal edema. Physical Medicine & Rehabilitation was consulted to assess candidacy for CIR.    Review of Systems  Constitutional: Positive for malaise/fatigue.  HENT: Negative.   Eyes: Negative.   Respiratory: Negative.   Cardiovascular: Negative.   Gastrointestinal: Negative.   Genitourinary: Negative.   Musculoskeletal: Positive for neck pain.  Skin: Negative.   Neurological: Positive for sensory change and weakness.  Endo/Heme/Allergies: Negative.   Psychiatric/Behavioral: Negative.    Past Medical History:  Diagnosis Date  . Cancer ([place])    vulvular  . Cervical disc disease    a. 01/2018 MRI Cervical spine: Cervical spondylosis with multilevel disc and facet degeneration greatest at C5-6 and C6-7.  Moderate to sev R C5-6, mod L C5-6, and mod bilat C6-7 foraminal stenosis with multilevel mild foraminal stenosis.  Mild C5-6 and mod C6-7 canal stenosis. C6-7 central cord impingement w/ cord flattening.  . Chronic combined systolic (congestive) and diastolic (congestive) heart failure ([place])    a. 12/2017 Echo: EF 30-35%, mid-apicalanteroseptal, ant, and apical AK. Gr1 DD. Mild conc LVH; b. 03/2018 Echo: EF 45-50%, antsept, ant HK. Mild MR. Nl LA size. Nl RV fxn.  Marland Kitchen COPD (chronic obstructive pulmonary disease) (H[facility])    not on home oxygen  . Coronary artery disease    a. 12/2017 ACS/PCI: LAD 100p (2.25x26 Resolute Onyx DES), 71m (2.0x12 Resolute Onyx DES), 80d (2.0x15 Resolute Onyx DES), RCA 90p (non-dominant). EF 25-35%. Post-MI course complicated by CGS; b. 0/0938  NSTEMI/subacute thrombosis-->LAD 100 (PTCA + DES x 1); c. 01/2018 NSTEMI/PCI: LM min irregs, LAD 50-60p ISR/hazy (3.5x12 Resolute DES), 47m/d (underexpansion of prior stents-->PTCA). EF 45-50%.  Marland Kitchen GERD (gastroesophageal reflux disease)   . H/O degenerative disc disease   . Hypotension   . Ischemic cardiomyopathy    a. 12/2017 Echo: EF 30-35%; b. 03/2018 Echo: EF 45-50%.  . Myocardial infarction ([place])    a. 12/2017-->DES to LAD x 3.  . Pancreatitis   . Shingles   . Spinal stenosis   . Tobacco abuse   . Ulcer (traumatic) of oral mucosa   . Urinary incontinence   . VIN III (vulvar intraepithelial neoplasia III)    Past Surgical History:  Procedure Laterality Date  . ABDOMINAL HYSTERECTOMY     partial  . ANTERIOR CERVICAL DECOMP/DISCECTOMY FUSION N/A 11/06/2020   Procedure: ANTERIOR CERVICAL DECOMPRESSION/DISCECTOMY FUSION 2 LEVELS C5-C7;  Surgeon: Meade Maw, MD;  Location: [facility] ORS;  Service: Neurosurgery;  Laterality: N/A;  . CORONARY BALLOON ANGIOPLASTY N/A 05/22/2018   Procedure: CORONARY BALLOON ANGIOPLASTY;  Surgeon: Wellington Hampshire, MD;  Location: [place] [facility];  Service: Cardiovascular;  Laterality: N/A;  . CORONARY STENT INTERVENTION N/A 12/26/2017   Procedure: CORONARY STENT INTERVENTION;  Surgeon: Wellington Hampshire, MD;  Location: [place] [facility];  Service: Cardiovascular;  Laterality: N/A;  . CORONARY STENT INTERVENTION N/A 02/08/2018   Procedure: CORONARY STENT INTERVENTION;  Surgeon: Nelva Bush, MD;  Location: [place] [facility];  Service: Cardiovascular;  Laterality: N/A;  . CORONARY/GRAFT ACUTE MI REVASCULARIZATION N/A 12/19/2017   Procedure: Coronary/Graft Acute MI Revascularization;  Surgeon: Wellington Hampshire, MD;  Location: [place] [facility]AS[facility]  [facility];  Service: Cardiovascular;  Laterality: N/A;  . ECTOPIC PREGNANCY SURGERY Left   . INTRAVASCULAR ULTRASOUND/IVUS N/A 02/08/2018   Procedure: Intravascular Ultrasound/IVUS;  Surgeon: Nelva Bush, MD;   Location: [place] [facility];  Service: Cardiovascular;  Laterality: N/A;  . LEFT HEART CATH AND CORONARY ANGIOGRAPHY N/A 12/19/2017   Procedure: LEFT HEART CATH AND CORONARY ANGIOGRAPHY;  Surgeon: Wellington Hampshire, MD;  Location: [place] [facility];  Service: Cardiovascular;  Laterality: N/A;  . LEFT HEART CATH AND CORONARY ANGIOGRAPHY N/A 12/26/2017   Procedure: LEFT HEART CATH AND CORONARY ANGIOGRAPHY;  Surgeon: Wellington Hampshire, MD;  Location: [place] [facility];  Service: Cardiovascular;  Laterality: N/A;  . LEFT HEART CATH AND CORONARY ANGIOGRAPHY N/A 02/08/2018   Procedure: LEFT HEART CATH AND CORONARY ANGIOGRAPHY;  Surgeon: Nelva Bush, MD;  Location: [place] [facility];  Service: Cardiovascular;  Laterality: N/A;  . LEFT HEART CATH AND CORONARY ANGIOGRAPHY N/A 05/22/2018   Procedure: LEFT HEART CATH AND CORONARY ANGIOGRAPHY;  Surgeon: Wellington Hampshire, MD;  Location: [place] [facility];  Service: Cardiovascular;  Laterality: N/A;  . LEFT HEART CATH AND CORONARY ANGIOGRAPHY N/A 06/15/2018   Procedure: LEFT HEART CATH AND CORONARY ANGIOGRAPHY;  Surgeon: Wellington Hampshire, MD;  Location: [place] [facility];  Service: Cardiovascular;  Laterality: N/A;  . LYMPHADENECTOMY    . SPLENECTOMY, PARTIAL    . vulvulasectomy     Family History  Problem Relation Age of Onset  . Non-Hodgkin's lymphoma Mother   . Osteoporosis Mother   . Lupus Sister   . Heart disease Brother   . Heart attack Brother   . Heart attack Father    Social History:  reports that she quit smoking about 2 years ago. Her smoking use included cigarettes. She has a 15.00 pack-year smoking history. She has quit using smokeless tobacco. She reports that she does not drink alcohol and does not use drugs. Allergies:  Allergies  Allergen Reactions  . Bee Venom Itching, Shortness Of Breath and Swelling  . Metformin And Related Shortness Of Breath and Swelling  . Darvon [Propoxyphene] Itching  . Gabapentin Swelling   . Nsaids Other (See Comments)    Ulcers   . Tramadol Hives  . Contrast Media [Iodinated Diagnostic Agents] Rash    If you benadryl, and steroids she is able to take the contrast per pt   Medications Prior to Admission  Medication Sig Dispense Refill  . albuterol (PROAIR HFA) 108 (90 Base) MCG/ACT inhaler Inhale 1 puff into the lungs every 6 (six) hours as needed for wheezing or shortness of breath.     Marland Kitchen aspirin 81 MG tablet Take 81 mg by mouth daily.    . Blood Glucose Monitoring Suppl (GLUCOCOM BLOOD GLUCOSE MONITOR) DEVI 1 m.    . clonazePAM (KLONOPIN) 1 MG tablet Take 1 mg by mouth 3 (three) times daily as needed.    Marland Kitchen esomeprazole (NEXIUM) 40 MG capsule Take 1 capsule (40 mg total) by mouth daily before breakfast. 90 capsule 0  . famotidine (PEPCID) 20 MG tablet Take 1 tablet (20 mg total) by mouth 2 (two) times daily. 180 tablet 0  . insulin isophane & regular human (NOVOLIN 70/30 FLEXPEN) (70-30) 100 UNIT/ML KwikPen Inject 18 Units into the skin 2 (two) times daily. 15 mL 11  . Lancet Devices (SIMPLE DIAGNOSTICS LANCING DEV) MISC AS DIRECTED    . Lidocaine HCl 4 % GEL Apply topically.    . mirtazapine (REMERON) 15 MG tablet  Take 15 mg by mouth at bedtime.    . nitroGLYCERIN (NITROSTAT) 0.4 MG SL tablet Place 1 tablet (0.4 mg total) under the tongue every 5 (five) minutes as needed for chest pain. 10 tablet 0  . ondansetron (ZOFRAN-ODT) 4 MG disintegrating tablet Take 4 mg by mouth every 8 (eight) hours as needed.    Marland Kitchen oxyCODONE (OXYCONTIN) 80 mg 12 hr tablet Take 1 tablet by mouth every 8 (eight) hours.    Marland Kitchen oxycodone (ROXICODONE) 30 MG immediate release tablet Take 30 mg by mouth every 3 (three) hours as needed.    Marland Kitchen SSD 1 % cream SMARTSIG:1 Topical 4-5 Times Daily    . sucralfate (CARAFATE) 1 g tablet Take 1 g by mouth 4 (four) times daily.    . ticagrelor (BRILINTA) 90 MG TABS tablet Take 1 tablet (90 mg total) by mouth 2 (two) times daily. *NEEDS OFFICE VISIT FOR FURTHER  REFILLS-PLEASE CALL [phone] TO SCHEDULE.* 60 tablet 0  . amitriptyline (ELAVIL) 25 MG tablet Take 1 tablet (25 mg total) by mouth at bedtime. (Patient taking differently: Take 75 mg by mouth at bedtime.) 30 tablet 2  . atorvastatin (LIPITOR) 80 MG tablet Take 1 tablet (80 mg total) by mouth daily at 6 PM. 90 tablet 0  . methocarbamol (ROBAXIN) 500 MG tablet Take 1 tablet by mouth 4 (four) times daily as needed.    . ranolazine (RANEXA) 500 MG 12 hr tablet Take 1 tablet (500 mg total) by mouth 2 (two) times daily. 60 tablet 0    Home: Home Living Family/patient expects to be discharged to:: Private residence Living Arrangements: Alone Available Help at Discharge: Family,Available PRN/intermittently Type of Home: Apartment Home Access: Level entry Home Layout: One level Bathroom Shower/Tub: Chiropodist: Standard Home Equipment: Environmental consultant - 4 wheels  Functional History: Prior Function Level of Independence: Independent Comments: Mod indep with 4WRW for ADLs, household and community mobilization; + driving; no home O2.  Denies fall history. Functional Status:  Mobility: Bed Mobility Overal bed mobility: Needs Assistance Bed Mobility: Supine to Sit,Sit to Supine Sidelying to sit: Supervision Supine to sit: Supervision Sit to supine: Supervision Sit to sidelying: Supervision General bed mobility comments: mobilizes bilat LEs and trunk without physical assist from therapist; does require use of bedrails to complete.  Absent recall/awareness of cervical precautions Transfers Overall transfer level: Needs assistance Equipment used: Rolling walker (2 wheeled) Transfers: Sit to/from Stand Sit to Stand: Min assist General transfer comment: tends to pull on RW, requires L UE placement on RW prior to lift off Ambulation/Gait Ambulation/Gait assistance: Min assist Gait Distance (Feet):  (5' x2) Assistive device: Rolling walker (2 wheeled) Gait Pattern/deviations:  Step-to pattern General Gait Details: step to gait pattern; slow, guarded gait pattern, poor balance reactions.  Increased time/effort for L UE placement on RW Gait velocity: decreased    ADL: ADL Overall ADL's : Needs assistance/impaired Lower Body Dressing: Maximal assistance,Sit to/from stand Lower Body Dressing Details (indicate cue type and reason): provided total assist for doffing/donning socks, mod assist for managing briefs while standing during toileting.  Of note, pt reports having reacher/sock aid at home to assist with LBD. Toilet Transfer: Production manager Details (indicate cue type and reason): provided min guard assist in sit to stand and stand pivot transfer with RW to bedside commode Toileting- Clothing Manipulation and Hygiene: Moderate assistance,Sit to/from stand Toileting - Clothing Manipulation Details (indicate cue type and reason): mod assist to manage briefs while standing at [facility], unilateral  UE support from RW Functional mobility during ADLs: Min guard,Rolling walker General ADL Comments: anticipate min A needed for seated UB ADLs 2/2 LUE weakness  Cognition: Cognition Overall Cognitive Status: Within Functional Limits for tasks assessed Orientation Level: Oriented X4 Cognition Arousal/Alertness: Lethargic Behavior During Therapy: WFL for tasks assessed/performed Overall Cognitive Status: Within Functional Limits for tasks assessed General Comments: pain is a barrier, but pt pleasant and motivated to participate  Blood pressure 101/62, pulse 96, temperature 99 F (37.2 C), resp. rate 16, height 5\' 1"  (1.549 m), weight 58 kg, SpO2 96 %. Physical Exam  Gen: no distress, normal appearing, fatigued HEENT: oral mucosa pink and moist, NCAT Cardio: Reg rate Chest: normal effort, normal rate of breathing Abd: soft, non-distended Ext: no edema Psych: pleasant, normal affect Skin: intact Neuro: Alert and oriented  x3 Musculoskeletal: 5/5 strength on right side LUE with 3/4 EF, EE, WE, 2/5 handgrip, decreased sensation left hand  Results for orders placed or performed during the hospital encounter of 11/06/20 (from the past 24 hour(s))  Glucose, capillary     Status: Abnormal   Collection Time: 11/10/20  4:56 PM  Result Value Ref Range   Glucose-Capillary 102 (H) 70 - 99 mg/dL   Comment 1 Notify RN   Glucose, capillary     Status: Abnormal   Collection Time: 11/10/20  9:18 PM  Result Value Ref Range   Glucose-Capillary 175 (H) 70 - 99 mg/dL   Comment 1 Notify RN   Glucose, capillary     Status: Abnormal   Collection Time: 11/11/20  7:39 AM  Result Value Ref Range   Glucose-Capillary 220 (H) 70 - 99 mg/dL  Procalcitonin - Baseline     Status: None   Collection Time: 11/11/20  8:59 AM  Result Value Ref Range   Procalcitonin 0.16 ng/mL  Magnesium     Status: Abnormal   Collection Time: 11/11/20  8:59 AM  Result Value Ref Range   Magnesium 1.5 (L) 1.7 - 2.4 mg/dL  CBC with Differential     Status: Abnormal   Collection Time: 11/11/20  8:59 AM  Result Value Ref Range   WBC 13.8 (H) 4.0 - 10.5 K/uL   RBC 4.79 3.87 - 5.11 MIL/uL   Hemoglobin 14.7 12.0 - 15.0 g/dL   HCT 45.0 36.0 - 46.0 %   MCV 93.9 80.0 - 100.0 fL   MCH 30.7 26.0 - 34.0 pg   MCHC 32.7 30.0 - 36.0 g/dL   RDW 12.8 11.5 - 15.5 %   Platelets 232 150 - 400 K/uL   nRBC 0.0 0.0 - 0.2 %   Neutrophils Relative % 92 %   Neutro Abs 12.6 (H) 1.7 - 7.7 K/uL   Lymphocytes Relative 4 %   Lymphs Abs 0.5 (L) 0.7 - 4.0 K/uL   Monocytes Relative 3 %   Monocytes Absolute 0.5 0.1 - 1.0 K/uL   Eosinophils Relative 1 %   Eosinophils Absolute 0.2 0.0 - 0.5 K/uL   Basophils Relative 0 %   Basophils Absolute 0.0 0.0 - 0.1 K/uL   Immature Granulocytes 0 %   Abs Immature Granulocytes 0.04 0.00 - 0.07 K/uL  Comprehensive metabolic panel     Status: Abnormal   Collection Time: 11/11/20  8:59 AM  Result Value Ref Range   Sodium 137 135 - 145  mmol/L   Potassium 3.6 3.5 - 5.1 mmol/L   Chloride 98 98 - 111 mmol/L   CO2 25 22 - 32 mmol/L   Glucose,  Bld 264 (H) 70 - 99 mg/dL   BUN 9 6 - 20 mg/dL   Creatinine, Ser 0.63 0.44 - 1.00 mg/dL   Calcium 9.0 8.9 - 10.3 mg/dL   Total Protein 8.2 (H) 6.5 - 8.1 g/dL   Albumin 3.5 3.5 - 5.0 g/dL   AST 115 (H) 15 - 41 U/L   ALT 67 (H) 0 - 44 U/L   Alkaline Phosphatase 224 (H) 38 - 126 U/L   Total Bilirubin 1.3 (H) 0.3 - 1.2 mg/dL   GFR, Estimated >60 >60 mL/min   Anion gap 14 5 - 15  Lactic acid, plasma     Status: Abnormal   Collection Time: 11/11/20 10:40 AM  Result Value Ref Range   Lactic Acid, Venous 2.0 (HH) 0.5 - 1.9 mmol/L  Protime-INR     Status: None   Collection Time: 11/11/20 10:40 AM  Result Value Ref Range   Prothrombin Time 14.6 11.4 - 15.2 seconds   INR 1.1 0.8 - 1.2  APTT     Status: None   Collection Time: 11/11/20 10:40 AM  Result Value Ref Range   aPTT 33 24 - 36 seconds  Blood gas, arterial     Status: None   Collection Time: 11/11/20 11:28 AM  Result Value Ref Range   FIO2 0.28    Delivery systems NASAL CANNULA    pH, Arterial 7.36 7.350 - 7.450   pCO2 arterial 46 32.0 - 48.0 mmHg   pO2, Arterial 90 83.0 - 108.0 mmHg   Bicarbonate 26.0 20.0 - 28.0 mmol/L   Acid-Base Excess 0.2 0.0 - 2.0 mmol/L   O2 Saturation 96.6 %   Patient temperature 37.0    Collection site LEFT RADIAL    Sample type ARTERIAL DRAW    Allens test (pass/fail) PASS PASS  Glucose, capillary     Status: Abnormal   Collection Time: 11/11/20 12:08 PM  Result Value Ref Range   Glucose-Capillary 187 (H) 70 - 99 mg/dL  Lactic acid, plasma     Status: None   Collection Time: 11/11/20 12:26 PM  Result Value Ref Range   Lactic Acid, Venous 1.7 0.5 - 1.9 mmol/L   DG Chest Port 1 View  Result Date: 11/11/2020 CLINICAL DATA:  Aspiration.  Shortness of breath. EXAM: PORTABLE CHEST 1 VIEW COMPARISON:  10/10/2020. FINDINGS: Heart size normal. Right base infiltrate consistent with pneumonia.  Aspiration could present in this fashion. Bibasilar atelectasis. No pleural effusion or pneumothorax. Barium noted the stomach. Prior cervical spine fusion. IMPRESSION: Right base infiltrate consistent with pneumonia. Aspiration could present this fashion. Bibasilar atelectasis. Electronically Signed   By: Marcello Moores  Register   On: 11/11/2020 09:09     Assessment/Plan: Diagnosis: Central cord syndrome 1. Does the need for close, 24 hr/day medical supervision in concert with the patient's rehab needs make it unreasonable for this patient to be served in a less intensive setting? Yes 2. Co-Morbidities requiring supervision/potential complications:  1. Left upper extremity weakness: would benefit from intensive PT and OT 2. Pharyngeal edema: would benefit from intensive SLP 3. CAD 4. GERD 5. HLD 3. Due to bladder management, bowel management, safety, skin/wound care, disease management, medication administration, pain management and patient education, does the patient require 24 hr/day rehab nursing? Yes 4. Does the patient require coordinated care of a physician, rehab nurse, therapy disciplines of OT, PT, SLP to address physical and functional deficits in the context of the above medical diagnosis(es)? Yes Addressing deficits in the following areas: balance, endurance,  locomotion, strength, transferring, bowel/bladder control, bathing, dressing, feeding, grooming, toileting, cognition, speech, language and swallowing 5. Can the patient actively participate in an intensive therapy program of at least 3 hrs of therapy per day at least 5 days per week? Yes 6. The potential for patient to make measurable gains while on inpatient rehab is excellent 7. Anticipated functional outcomes upon discharge from inpatient rehab are modified independent  with PT, modified independent with OT, modified independent with SLP. 8. Estimated rehab length of stay to reach the above functional goals is: 10-14  days 9. Anticipated discharge destination: Home 10. Overall Rehab/Functional Prognosis: excellent  RECOMMENDATIONS: This patient's condition is appropriate for continued rehabilitative care in the following setting: CIR Patient has agreed to participate in recommended program. No Note that insurance prior authorization may be required for reimbursement for recommended care.  Comment: Mrs. Gaglio would be an excellent CIR candidate but prefers to go home where she has the support of friends. She does appear to have th capacity to make her own decisions. Discussed CIR vs. SNF vs. Home with services extensively and patient adamantly prefers home. Discussed with her to please let us know if she reconsiders. Educated her regarding central cord syndrome and the pharyngeal edema she is experiencing. Agree with Happi's recommendations for palliative care consult given goals of care discussion in regards to feeding.    Izora Ribas, MD 11/11/2020

## 2020-11-12 DIAGNOSIS — R1312 Dysphagia, oropharyngeal phase: Secondary | ICD-10-CM

## 2020-11-12 DIAGNOSIS — Z7189 Other specified counseling: Secondary | ICD-10-CM | POA: Diagnosis not present

## 2020-11-12 DIAGNOSIS — M4802 Spinal stenosis, cervical region: Secondary | ICD-10-CM | POA: Diagnosis not present

## 2020-11-12 DIAGNOSIS — Z515 Encounter for palliative care: Secondary | ICD-10-CM

## 2020-11-12 DIAGNOSIS — T17908A Unspecified foreign body in respiratory tract, part unspecified causing other injury, initial encounter: Secondary | ICD-10-CM

## 2020-11-12 DIAGNOSIS — E440 Moderate protein-calorie malnutrition: Secondary | ICD-10-CM | POA: Insufficient documentation

## 2020-11-12 DIAGNOSIS — E119 Type 2 diabetes mellitus without complications: Secondary | ICD-10-CM | POA: Diagnosis not present

## 2020-11-12 DIAGNOSIS — E876 Hypokalemia: Secondary | ICD-10-CM

## 2020-11-12 LAB — CBC
HCT: 36.7 % (ref 36.0–46.0)
Hemoglobin: 12 g/dL (ref 12.0–15.0)
MCH: 30.9 pg (ref 26.0–34.0)
MCHC: 32.7 g/dL (ref 30.0–36.0)
MCV: 94.6 fL (ref 80.0–100.0)
Platelets: 202 10*3/uL (ref 150–400)
RBC: 3.88 MIL/uL (ref 3.87–5.11)
RDW: 13.2 % (ref 11.5–15.5)
WBC: 10.2 10*3/uL (ref 4.0–10.5)
nRBC: 0 % (ref 0.0–0.2)

## 2020-11-12 LAB — BASIC METABOLIC PANEL
Anion gap: 10 (ref 5–15)
BUN: 10 mg/dL (ref 6–20)
CO2: 24 mmol/L (ref 22–32)
Calcium: 8.3 mg/dL — ABNORMAL LOW (ref 8.9–10.3)
Chloride: 105 mmol/L (ref 98–111)
Creatinine, Ser: 0.55 mg/dL (ref 0.44–1.00)
GFR, Estimated: >60 mL/min (ref >60)
Glucose, Bld: 105 mg/dL — ABNORMAL HIGH (ref 70–99)
Potassium: 2.9 mmol/L — ABNORMAL LOW (ref 3.5–5.1)
Sodium: 139 mmol/L (ref 135–145)

## 2020-11-12 LAB — GLUCOSE, CAPILLARY
Glucose-Capillary: 101 mg/dL — ABNORMAL HIGH (ref 70–99)
Glucose-Capillary: 112 mg/dL — ABNORMAL HIGH (ref 70–99)
Glucose-Capillary: 345 mg/dL — ABNORMAL HIGH (ref 70–99)
Glucose-Capillary: 366 mg/dL — ABNORMAL HIGH (ref 70–99)

## 2020-11-12 LAB — MRSA PCR SCREENING: MRSA by PCR: NEGATIVE

## 2020-11-12 MED ORDER — POTASSIUM CHLORIDE 10 MEQ/100ML IV SOLN
10.0000 meq | INTRAVENOUS | Status: AC
  Administered 2020-11-12 (×4): 10 meq via INTRAVENOUS
  Filled 2020-11-12 (×4): qty 100

## 2020-11-12 MED ORDER — POTASSIUM CHLORIDE 20 MEQ PO PACK
20.0000 meq | PACK | Freq: Two times a day (BID) | ORAL | Status: AC
  Administered 2020-11-12 (×2): 20 meq via ORAL
  Filled 2020-11-12 (×2): qty 1

## 2020-11-12 MED ORDER — ENSURE ENLIVE PO LIQD
237.0000 mL | Freq: Three times a day (TID) | ORAL | Status: DC
  Administered 2020-11-12 – 2020-11-14 (×6): 237 mL via ORAL

## 2020-11-12 MED ORDER — ADULT MULTIVITAMIN W/MINERALS CH
1.0000 | ORAL_TABLET | Freq: Every day | ORAL | Status: DC
  Administered 2020-11-12 – 2020-11-14 (×3): 1 via ORAL
  Filled 2020-11-12 (×3): qty 1

## 2020-11-12 MED ORDER — DEXAMETHASONE SODIUM PHOSPHATE 4 MG/ML IJ SOLN
4.0000 mg | Freq: Four times a day (QID) | INTRAMUSCULAR | Status: DC
  Administered 2020-11-12 – 2020-11-13 (×3): 4 mg via INTRAVENOUS
  Filled 2020-11-12 (×4): qty 1

## 2020-11-12 MED ORDER — MAGNESIUM SULFATE 2 GM/50ML IV SOLN
2.0000 g | Freq: Once | INTRAVENOUS | Status: AC
  Administered 2020-11-12: 2 g via INTRAVENOUS
  Filled 2020-11-12: qty 50

## 2020-11-12 NOTE — Progress Notes (Signed)
Initial Nutrition Assessment  DOCUMENTATION CODES:  Non-severe (moderate) malnutrition in context of chronic illness  INTERVENTION:   Continue current diet as ordered, advance as able as swallowing improves  Ensure Enlive po TID, each supplement provides 350 kcal and 20 grams of protein  MVI with minerals daily  Request new measured weight  NUTRITION DIAGNOSIS:  Moderate Malnutrition (in the context of chronic illness) related to decreased appetite as evidenced by mild fat depletion,moderate fat depletion,mild muscle depletion,moderate muscle depletion.  GOAL:  Patient will meet greater than or equal to 90% of their needs  MONITOR:  PO intake,Supplement acceptance,Diet advancement,Labs  REASON FOR ASSESSMENT:  Diagnosis,NPO/Clear Liquid Diet (malnutrition)    ASSESSMENT:  Pt presented to ED with chest pain and left arm weakness. Endorses some nausea. Imaging in ED showed worsening degenerative disc disease with mass-effect on spinal cord thought to be the cause of weakness. PMH relevant for vulvular cancer (undergoing treatment), CHF (EF 45-50%), COPD, CAD (s/p stenting), GERD, Hx MI, DM type 2, HLD  Pt taken to OR 5/19 for cervical discectomy and fusion. Neurosurgery felt pt to be stable after surgery and pt seemed to tolerate well. However, pt subsequently developed lethargy, confusion, and dysphagia. Made NPO by SLP 5/24 and concern for aspiration pneumonia.  Pt resting in bed at the time of visit. States she is feeling much better and has had a productive cough today. Feels as though clearing her throat has helped her swallowing. Was advanced to a puree diet with thin liquids. Looking forward to having lunch. Pt endorses a poor appetite at home for several months due to her cancer treatments. Also endorses weight loss from her usual of 130 lb. States she is drinking 3 boost drinks each day to help supplement her intake. Agreeable to receiving here as well.   5/19 - Op, Anterior  Cervical Decompression/discectomy fusion C5-C7   Diet Order Hx: 5/19: NPO 5/20-5/22: Heart Healthy/Carb Modified 5/23: DYS 1, nectar thick 5/24: NPO  Average Meal Intake: . 5/19-5/25: 56% intake x 5 recorded meals (0-100%)  Nutritionally Relevant Medications: Scheduled Meds: . atorvastatin  80 mg Oral q1800  . famotidine  20 mg Oral BID  . insulin aspart  0-5 Units Subcutaneous QHS  . insulin aspart  0-9 Units Subcutaneous TID WC  . insulin aspart protamine- aspart  10 Units Subcutaneous BID WC  . mirtazapine  15 mg Oral QHS  . pantoprazole  40 mg Oral QAC breakfast  . polyethylene glycol  17 g Oral Daily  . senna-docusate  1 tablet Oral BID  . sucralfate  1 g Oral QID   Continuous Infusions: . sodium chloride 125 mL/hr at 11/11/20 0906  . ceFEPime (MAXIPIME) IV 2 g (11/12/20 0504)  . metronidazole 500 mg (11/12/20 0235)   PRN Meds: bisacodyl,  ondansetron  Nutritionally Relevant Labs:  SBG ranges from 96-220 mg/dL over the last 24 hours  HgbA1c 11.1% (5/19)  NUTRITION - FOCUSED PHYSICAL EXAM: Flowsheet Row Most Recent Value  Orbital Region Moderate depletion  Upper Arm Region Mild depletion  Thoracic and Lumbar Region Mild depletion  Buccal Region Moderate depletion  Clavicle Bone Region Mild depletion  Clavicle and Acromion Bone Region Moderate depletion  Scapular Bone Region Mild depletion  Dorsal Hand Mild depletion  Patellar Region Moderate depletion  Anterior Thigh Region Moderate depletion  Posterior Calf Region Moderate depletion  Edema (RD Assessment) None  Hair Reviewed  Eyes Reviewed  Mouth Reviewed  Skin Reviewed  Nails Reviewed     Diet  Order:   Diet Order            DIET - DYS 1 Room service appropriate? Yes; Fluid consistency: Thin  Diet effective now                EDUCATION NEEDS:  No education needs have been identified at this time  Skin:  Skin Assessment: Skin Integrity Issues: Skin Integrity Issues:: Incisions Incisions:  neck  Last BM:  5/18 per pt (PTA)  Height:  Ht Readings from Last 1 Encounters:  11/06/20 5\' 1"  (1.549 m)    Weight:  Wt Readings from Last 1 Encounters:  11/07/20 58 kg    Ideal Body Weight:  47.7 kg  BMI:  Body mass index is 24.15 kg/m.  Estimated Nutritional Needs:   Kcal:  1700-1900 kcal/d  Protein:  85-100 g/d  Fluid:  >1700 mL/d   Ranell Patrick, RD, LDN Clinical Dietitian Pager on [place]

## 2020-11-12 NOTE — Progress Notes (Signed)
PROGRESS NOTE    Renee Mack  HOZ:[id number] DOB: 1965/07/30 DOA: 11/06/2020 PCP: Olin Hauser, DO   Brief Narrative:  55 y.o. female with medical history significant of HLD, DM, CAD with DES, sCHF with EF 45-50%, vulvar CA (s/p of surgery and radiation therapy), COPD, GERD, depression with anxiety, former smoker, pancreatitis, DKA, who presents with chest pain and left arm weakness.  Patient was not found to have any active cardiac issue however she was noted to have weakness in her left arm and left lower extremity.  Underwent MRI of the cervical spine which shows significant spinal stenosis.  Subsequently seen by neurosurgery and underwent discectomy and fusion.  Over the last few days the issue has been lethargy and dysphagia.  Patient has declined inpatient rehabilitation.  She also aspirated and is currently on antibiotics for aspiration pneumonia.   Assessment & Plan:   Presumed aspiration pneumonia/Severe sepsis/Acute metabolic encephalopathy On 5/24 patient developed acute onset of fever and tachycardia, altered mentation. Chest x-ray reveals a right base infiltrate consistent with pneumonia. Patient was started on broad-spectrum antibiotics with vancomycin and cefepime.  Fever appears to have resolved.  WBC was noted to be elevated yesterday and noted to be normal today.  Follow-up on cultures.  Keep her on broad-spectrum antibiotics for now. Mentation appears to have improved. Lactic acid was mildly elevated and improved to 1.7.  Spinal stenosis of cervical region Patient's left arm weakness was likely due to cervical spine stenosis.  MRI of C spin showed degenerative disc disease C5-C7, with mass-effect on spinal cord.   Dr. Izora Ribas of neurosurgery was consulted and she is status post cervical discectomy with fusion 5/19. Pain control.  PT and OT evaluation.  Inpatient rehabilitation was recommended but patient declines.  Would prefer to go home  instead.  Oropharyngeal dysphagia Likely due to pharyngeal edema from recent surgery.  Discussed with neurosurgery today.  No role for any repeat imaging studies.  Steroid is recommended.  Started on dexamethasone.  Hopefully this will help edema and improve her swallowing ability.  Patient has not been compliant with speech therapy recommendations.  She was educated comprehensively.  Strict aspiration precautions.  GERD (gastroesophageal reflux disease) Continue home Protonix and Pepcid  Hyperlipidemia LDL goal <70 Continue home Lipitor  Chest pain and history of CAD: S/p of DES 2019.  Troponin 12, 11.   Her chest pain was close to the front neck, and might have been related to the spinal cord stenosis. Cardiology.  No inpatient testing was recommended.  Outpatient follow-up. Continue aspirin 81 daily Hold Brilinta postoperatively for at least 7 days from date of surgery (5/19.,  Earliest restart date would be 7/04)  Chronic systolic heart failure   2D echo on 03/25/2018 showed EF of 45-50%.   CHF seem to be compensated.  Anxiety associated with depression Continue home medications.  Avoid excessive sedative medications for  Diabetes mellitus without complication Recent U8Q 91.6, poorly controlled.    Patient noted to be on 70/30 insulin and SSI.  CBGs are reasonably well controlled.  Hypokalemia and hypomagnesemia Will be repleted.  Hemoptysis CT angiograms negative for aortic dissection or central PE  Dilated bile duct CT angiogram incidentally showed extrahepatic bile duct dilation.  Initial LFTs were unremarkable.  Noted to have mildly elevated AST ALT yesterday.  We will recheck tomorrow.  However she is asymptomatic from GI standpoint.  Vulvar cancer  s/p surgery and XRT. -pt is high dose pain med,  OxyContin 80 mg twice daily  and as needed oxycodone 30 mg every 8 hours   DVT prophylaxis: SQ Lovenox Code Status: Full Family Communication: Daughter [name]  [phone] on 5/24 Disposition Plan: Status is: Inpatient  Remains inpatient appropriate because:Inpatient level of care appropriate due to severity of illness   Dispo: The patient is from: Home              Anticipated d/c is to: CIR              Patient currently is not medically stable to d/c.   Difficult to place patient No    Consultants:   Neurosurgery  Cardiology  Procedures:  Anterior cervical discectomy and fusion, 5/19    Subjective: Patient awake alert.  Complains of some pain in the throat area.  Denies any chest pain shortness of breath nausea or vomiting.    Objective: Vitals:   11/11/20 1559 11/11/20 1925 11/12/20 0439 11/12/20 0742  BP: (!) 105/54 (!) 115/57 (!) 90/49 122/62  Pulse: 84 74 71 89  Resp: 16 18 18 17   Temp: 98.3 F (36.8 C) 98.1 F (36.7 C) 97.9 F (36.6 C) 98 F (36.7 C)  TempSrc: Oral Oral Oral Axillary  SpO2: 97% 97% 95% 93%  Weight:      Height:        Intake/Output Summary (Last 24 hours) at 11/12/2020 1017 Last data filed at 11/12/2020 0504 Gross per 24 hour  Intake 4271.93 ml  Output 1000 ml  Net 3271.93 ml   Filed Weights   11/06/20 0621 11/06/20 1702 11/07/20 0330  Weight: 58.5 kg 59 kg 58 kg    Examination:  General appearance: Awake alert.  In no distress Resp: Clear to auscultation bilaterally.  Normal effort Cardio: S1-S2 is normal regular.  No S3-S4.  No rubs murmurs or bruit GI: Abdomen is soft.  Nontender nondistended.  Bowel sounds are present normal.  No masses organomegaly Extremities: No edema.  Neurologic: Alert and oriented x3.  No focal neurological deficits.       Data Reviewed: I have personally reviewed following labs and imaging studies  CBC: Recent Labs  Lab 11/06/20 0629 11/07/20 0520 11/09/20 1105 11/11/20 0859 11/12/20 0847  WBC 6.7 12.7* 6.1 13.8* 10.2  NEUTROABS 4.0  --  3.3 12.6*  --   HGB 14.3 12.7 14.3 14.7 12.0  HCT 42.4 37.6 44.1 45.0 36.7  MCV 91.4 91.7 95.5 93.9 94.6   PLT 298 278 221 232 562   Basic Metabolic Panel: Recent Labs  Lab 11/06/20 0629 11/07/20 0520 11/09/20 1105 11/11/20 0859 11/12/20 0847  NA 137 135 138 137 139  K 4.1 4.1 3.5 3.6 2.9*  CL 102 104 106 98 105  CO2 25 20* 22 25 24   GLUCOSE 281* 370* 113* 264* 105*  BUN 9 12 10 9 10   CREATININE 0.60 0.51 0.46 0.63 0.55  CALCIUM 9.4 8.7* 8.7* 9.0 8.3*  MG 1.7  --   --  1.5*  --    GFR: Estimated Creatinine Clearance: 64.3 mL/min (by C-G formula based on SCr of 0.55 mg/dL). Liver Function Tests: Recent Labs  Lab 11/06/20 0629 11/11/20 0859  AST 13* 115*  ALT 12 67*  ALKPHOS 98 224*  BILITOT 0.5 1.3*  PROT 7.5 8.2*  ALBUMIN 3.5 3.5   Coagulation Profile: Recent Labs  Lab 11/06/20 0629 11/11/20 1040  INR 1.0 1.1   CBG: Recent Labs  Lab 11/11/20 0739 11/11/20 1208 11/11/20 1605 11/11/20 2116 11/12/20 0743  GLUCAP 220* 187* 98 96  101*   Sepsis Labs: Recent Labs  Lab 11/11/20 0859 11/11/20 1040 11/11/20 1226  PROCALCITON 0.16  --   --   LATICACIDVEN  --  2.0* 1.7    Recent Results (from the past 240 hour(s))  Resp Panel by RT-PCR (Flu A&B, Covid) Nasopharyngeal Swab     Status: None   Collection Time: 11/06/20  7:17 AM   Specimen: Nasopharyngeal Swab; Nasopharyngeal(NP) swabs in vial transport medium  Result Value Ref Range Status   SARS Coronavirus 2 by RT PCR NEGATIVE NEGATIVE Final    Comment: (NOTE) SARS-CoV-2 target nucleic acids are NOT DETECTED.  The SARS-CoV-2 RNA is generally detectable in upper respiratory specimens during the acute phase of infection. The lowest concentration of SARS-CoV-2 viral copies this assay can detect is 138 copies/mL. A negative result does not preclude SARS-Cov-2 infection and should not be used as the sole basis for treatment or other patient management decisions. A negative result may occur with  improper specimen collection/handling, submission of specimen other than nasopharyngeal swab, presence of viral  mutation(s) within the areas targeted by this assay, and inadequate number of viral copies(<138 copies/mL). A negative result must be combined with clinical observations, patient history, and epidemiological information. The expected result is Negative.  Fact Sheet for Patients:  EntrepreneurPulse.com.au  Fact Sheet for Healthcare Providers:  IncredibleEmployment.be  This test is no t yet approved or cleared by the [place] FDA and  has been authorized for detection and/or diagnosis of SARS-CoV-2 by FDA under an Emergency Use Authorization (EUA). This EUA will remain  in effect (meaning this test can be used) for the duration of the COVID-19 declaration under Section 564(b)(1) of the Act, 21 U.S.C.section 360bbb-3(b)(1), unless the authorization is terminated  or revoked sooner.       Influenza A by PCR NEGATIVE NEGATIVE Final   Influenza B by PCR NEGATIVE NEGATIVE Final    Comment: (NOTE) The Xpert Xpress SARS-CoV-2/FLU/RSV plus assay is intended as an aid in the diagnosis of influenza from Nasopharyngeal swab specimens and should not be used as a sole basis for treatment. Nasal washings and aspirates are unacceptable for Xpert Xpress SARS-CoV-2/FLU/RSV testing.  Fact Sheet for Patients: EntrepreneurPulse.com.au  Fact Sheet for Healthcare Providers: IncredibleEmployment.be  This test is not yet approved or cleared by the [place] FDA and has been authorized for detection and/or diagnosis of SARS-CoV-2 by FDA under an Emergency Use Authorization (EUA). This EUA will remain in effect (meaning this test can be used) for the duration of the COVID-19 declaration under Section 564(b)(1) of the Act, 21 U.S.C. section 360bbb-3(b)(1), unless the authorization is terminated or revoked.  Performed at [facility], [address] [place], [place] [address]   Culture, blood (x 2)     Status:  None (Preliminary result)   Collection Time: 11/11/20  9:13 AM   Specimen: BLOOD  Result Value Ref Range Status   Specimen Description BLOOD LEFT ANTECUBITAL  Final   Special Requests   Final    BOTTLES DRAWN AEROBIC AND ANAEROBIC Blood Culture adequate volume   Culture   Final    NO GROWTH < 24 HOURS Performed at [facility], [address] [place], [place] [address]    Report Status PENDING  Incomplete  Culture, blood (x 2)     Status: None (Preliminary result)   Collection Time: 11/11/20 10:40 AM   Specimen: BLOOD LEFT ARM  Result Value Ref Range Status   Specimen Description BLOOD LEFT ARM  Final   Special Requests   Final    BOTTLES DRAWN AEROBIC AND ANAEROBIC Blood Culture adequate volume   Culture   Final    NO GROWTH < 24 HOURS Performed at [facility], [address], [place], [place] [address]    Report Status PENDING  Incomplete         Radiology Studies: DG Chest Port 1 View  Result Date: 11/11/2020 CLINICAL DATA:  Aspiration.  Shortness of breath. EXAM: PORTABLE CHEST 1 VIEW COMPARISON:  10/10/2020. FINDINGS: Heart size normal. Right base infiltrate consistent with pneumonia. Aspiration could present in this fashion. Bibasilar atelectasis. No pleural effusion or pneumothorax. Barium noted the stomach. Prior cervical spine fusion. IMPRESSION: Right base infiltrate consistent with pneumonia. Aspiration could present this fashion. Bibasilar atelectasis. Electronically Signed   By: Marcello Moores  Register   On: 11/11/2020 09:09   DG Swallowing Func-Speech Pathology  Result Date: 11/12/2020 Objective Swallowing Evaluation: Type of Study: MBS-Modified Barium Swallow Study  Patient Details Name: Tahliyah Anagnos MRN: [id number] Date of Birth: Jan 28, 1966 Today's Date: 11/12/2020 Time: SLP Start Time (ACUTE ONLY): 0830 -SLP Stop Time (ACUTE ONLY): 0850 SLP Time Calculation (min) (ACUTE ONLY): 20 min Past Medical History: Past Medical History: Diagnosis Date . Cancer  ([place])   vulvular . Cervical disc disease   a. 01/2018 MRI Cervical spine: Cervical spondylosis with multilevel disc and facet degeneration greatest at C5-6 and C6-7.  Moderate to sev R C5-6, mod L C5-6, and mod bilat C6-7 foraminal stenosis with multilevel mild foraminal stenosis.  Mild C5-6 and mod C6-7 canal stenosis. C6-7 central cord impingement w/ cord flattening. . Chronic combined systolic (congestive) and diastolic (congestive) heart failure ([place])   a. 12/2017 Echo: EF 30-35%, mid-apicalanteroseptal, ant, and apical AK. Gr1 DD. Mild conc LVH; b. 03/2018 Echo: EF 45-50%, antsept, ant HK. Mild MR. Nl LA size. Nl RV fxn. Marland Kitchen COPD (chronic obstructive pulmonary disease) (H[facility])   not on home oxygen . Coronary artery disease   a. 12/2017 ACS/PCI: LAD 100p (2.25x26 Resolute Onyx DES), 55m (2.0x12 Resolute Onyx DES), 80d (2.0x15 Resolute Onyx DES), RCA 90p (non-dominant). EF 25-35%. Post-MI course complicated by CGS; b. 06/6965 NSTEMI/subacute thrombosis-->LAD 100 (PTCA + DES x 1); c. 01/2018 NSTEMI/PCI: LM min irregs, LAD 50-60p ISR/hazy (3.5x12 Resolute DES), 71m/d (underexpansion of prior stents-->PTCA). EF 45-50%. Marland Kitchen GERD (gastroesophageal reflux disease)  . H/O degenerative disc disease  . Hypotension  . Ischemic cardiomyopathy   a. 12/2017 Echo: EF 30-35%; b. 03/2018 Echo: EF 45-50%. . Myocardial infarction ([place])   a. 12/2017-->DES to LAD x 3. . Pancreatitis  . Shingles  . Spinal stenosis  . Tobacco abuse  . Ulcer (traumatic) of oral mucosa  . Urinary incontinence  . VIN III (vulvar intraepithelial neoplasia III)  Past Surgical History: Past Surgical History: Procedure Laterality Date . ABDOMINAL HYSTERECTOMY    partial . ANTERIOR CERVICAL DECOMP/DISCECTOMY FUSION N/A 11/06/2020  Procedure: ANTERIOR CERVICAL DECOMPRESSION/DISCECTOMY FUSION 2 LEVELS C5-C7;  Surgeon: Meade Maw, MD;  Location: [facility] ORS;  Service: Neurosurgery;  Laterality: N/A; . CORONARY BALLOON ANGIOPLASTY N/A 05/22/2018  Procedure: CORONARY  BALLOON ANGIOPLASTY;  Surgeon: Wellington Hampshire, MD;  Location: [place] [facility];  Service: Cardiovascular;  Laterality: N/A; . CORONARY STENT INTERVENTION N/A 12/26/2017  Procedure: CORONARY STENT INTERVENTION;  Surgeon: Wellington Hampshire, MD;  Location: [place] [facility];  Service: Cardiovascular;  Laterality: N/A; . CORONARY STENT INTERVENTION N/A 02/08/2018  Procedure: CORONARY STENT INTERVENTION;  Surgeon: Nelva Bush, MD;  Location: [place] [facility]  [facility];  Service: Cardiovascular;  Laterality: N/A; . CORONARY/GRAFT ACUTE MI REVASCULARIZATION N/A 12/19/2017  Procedure: Coronary/Graft Acute MI Revascularization;  Surgeon: Wellington Hampshire, MD;  Location: [place] [facility];  Service: Cardiovascular;  Laterality: N/A; . ECTOPIC PREGNANCY SURGERY Left  . INTRAVASCULAR ULTRASOUND/IVUS N/A 02/08/2018  Procedure: Intravascular Ultrasound/IVUS;  Surgeon: Nelva Bush, MD;  Location: [place] [facility];  Service: Cardiovascular;  Laterality: N/A; . LEFT HEART CATH AND CORONARY ANGIOGRAPHY N/A 12/19/2017  Procedure: LEFT HEART CATH AND CORONARY ANGIOGRAPHY;  Surgeon: Wellington Hampshire, MD;  Location: [place] [facility];  Service: Cardiovascular;  Laterality: N/A; . LEFT HEART CATH AND CORONARY ANGIOGRAPHY N/A 12/26/2017  Procedure: LEFT HEART CATH AND CORONARY ANGIOGRAPHY;  Surgeon: Wellington Hampshire, MD;  Location: [place] [facility];  Service: Cardiovascular;  Laterality: N/A; . LEFT HEART CATH AND CORONARY ANGIOGRAPHY N/A 02/08/2018  Procedure: LEFT HEART CATH AND CORONARY ANGIOGRAPHY;  Surgeon: Nelva Bush, MD;  Location: [place] [facility];  Service: Cardiovascular;  Laterality: N/A; . LEFT HEART CATH AND CORONARY ANGIOGRAPHY N/A 05/22/2018  Procedure: LEFT HEART CATH AND CORONARY ANGIOGRAPHY;  Surgeon: Wellington Hampshire, MD;  Location: [place] [facility];  Service: Cardiovascular;  Laterality: N/A; . LEFT HEART CATH AND CORONARY ANGIOGRAPHY N/A 06/15/2018  Procedure: LEFT HEART CATH AND  CORONARY ANGIOGRAPHY;  Surgeon: Wellington Hampshire, MD;  Location: [place] [facility];  Service: Cardiovascular;  Laterality: N/A; . LYMPHADENECTOMY   . SPLENECTOMY, PARTIAL   . vulvulasectomy   HPI: Presented to ER secondary to chest pain, L UE weakness/numbness; admitted for management of spinal stenosis of cervical region, s/p emergent C5-7 ACDF (11/06/20). Of note, currently undergoing radiation therapy for vulvular cancer.  Subjective: "I feel more confused" Assessment / Plan / Recommendation CHL IP CLINICAL IMPRESSIONS 11/12/2020 Clinical Impression -- SLP Visit Diagnosis Dysphagia, oropharyngeal phase (R13.12) Attention and concentration deficit following -- Frontal lobe and executive function deficit following -- Impact on safety and function --   CHL IP TREATMENT RECOMMENDATION 11/11/2020 Treatment Recommendations Therapy as outlined in treatment plan below   Prognosis 11/11/2020 Prognosis for Safe Diet Advancement Fair Barriers to Reach Goals Cognitive deficits Barriers/Prognosis Comment -- CHL IP DIET RECOMMENDATION 11/12/2020 SLP Diet Recommendations -- Liquid Administration via -- Medication Administration -- Compensations Minimize environmental distractions;Slow rate;Small sips/bites Postural Changes --   CHL IP OTHER RECOMMENDATIONS 11/11/2020 Recommended Consults -- Oral Care Recommendations Oral care QID;Oral care before and after PO Other Recommendations --   CHL IP FOLLOW UP RECOMMENDATIONS 11/12/2020 Follow up Recommendations (No Data)   CHL IP FREQUENCY AND DURATION 11/11/2020 Speech Therapy Frequency (ACUTE ONLY) min 2x/week Treatment Duration 2 weeks      CHL IP ORAL PHASE 11/11/2020 Oral Phase Impaired Oral - Pudding Teaspoon -- Oral - Pudding Cup -- Oral - Honey Teaspoon -- Oral - Honey Cup -- Oral - Nectar Teaspoon Weak lingual manipulation;Reduced posterior propulsion;Holding of bolus;Piecemeal swallowing;Delayed oral transit;Decreased bolus cohesion;Premature spillage Oral - Nectar Cup Weak  lingual manipulation;Reduced posterior propulsion;Holding of bolus;Piecemeal swallowing;Delayed oral transit;Decreased bolus cohesion;Premature spillage Oral - Nectar Straw -- Oral - Thin Teaspoon Weak lingual manipulation;Reduced posterior propulsion;Holding of bolus;Piecemeal swallowing;Delayed oral transit;Decreased bolus cohesion;Premature spillage Oral - Thin Cup -- Oral - Thin Straw -- Oral - Puree Weak lingual manipulation;Reduced posterior propulsion;Holding of bolus;Piecemeal swallowing;Delayed oral transit;Decreased bolus cohesion;Premature spillage Oral - Mech Soft -- Oral - Regular -- Oral - Multi-Consistency -- Oral - Pill -- Oral Phase - Comment --  CHL IP PHARYNGEAL PHASE 11/11/2020 Pharyngeal Phase  Impaired Pharyngeal- Pudding Teaspoon -- Pharyngeal -- Pharyngeal- Pudding Cup -- Pharyngeal -- Pharyngeal- Honey Teaspoon -- Pharyngeal -- Pharyngeal- Honey Cup -- Pharyngeal -- Pharyngeal- Nectar Teaspoon Delayed swallow initiation-pyriform sinuses;Reduced pharyngeal peristalsis;Reduced epiglottic inversion;Reduced airway/laryngeal closure;Penetration/Aspiration during swallow;Pharyngeal residue - valleculae;Compensatory strategies attempted (with notebox) Pharyngeal Material enters airway, CONTACTS cords and not ejected out Pharyngeal- Nectar Cup Delayed swallow initiation-pyriform sinuses;Reduced pharyngeal peristalsis;Reduced epiglottic inversion;Reduced airway/laryngeal closure;Penetration/Aspiration during swallow;Pharyngeal residue - valleculae Pharyngeal Material enters airway, CONTACTS cords and not ejected out Pharyngeal- Nectar Straw -- Pharyngeal -- Pharyngeal- Thin Teaspoon Delayed swallow initiation-pyriform sinuses;Reduced pharyngeal peristalsis;Reduced epiglottic inversion;Reduced airway/laryngeal closure;Penetration/Aspiration during swallow;Pharyngeal residue - valleculae Pharyngeal Material enters airway, CONTACTS cords and not ejected out Pharyngeal- Thin Cup -- Pharyngeal --  Pharyngeal- Thin Straw -- Pharyngeal -- Pharyngeal- Puree Delayed swallow initiation-vallecula;Reduced pharyngeal peristalsis;Reduced epiglottic inversion;Pharyngeal residue - valleculae Pharyngeal -- Pharyngeal- Mechanical Soft -- Pharyngeal -- Pharyngeal- Regular -- Pharyngeal -- Pharyngeal- Multi-consistency -- Pharyngeal -- Pharyngeal- Pill -- Pharyngeal -- Pharyngeal Comment --  CHL IP CERVICAL ESOPHAGEAL PHASE 11/11/2020 Cervical Esophageal Phase WFL Pudding Teaspoon -- Pudding Cup -- Honey Teaspoon -- Honey Cup -- Nectar Teaspoon -- Nectar Cup -- Nectar Straw -- Thin Teaspoon -- Thin Cup -- Thin Straw -- Puree -- Mechanical Soft -- Regular -- Multi-consistency -- Pill -- Cervical Esophageal Comment -- Happi Overton 11/12/2020, 9:48 AM                   Scheduled Meds: . acetaminophen  1,000 mg Oral TID  . amitriptyline  75 mg Oral QHS  . amLODipine  5 mg Oral Daily  . aspirin EC  81 mg Oral Daily  . atorvastatin  80 mg Oral q1800  . dexamethasone (DECADRON) injection  4 mg Intravenous Q6H  . enoxaparin (LOVENOX) injection  40 mg Subcutaneous Q24H  . famotidine  20 mg Oral BID  . insulin aspart  0-5 Units Subcutaneous QHS  . insulin aspart  0-9 Units Subcutaneous TID WC  . insulin aspart protamine- aspart  10 Units Subcutaneous BID WC  . methocarbamol  500 mg Oral TID  . mirtazapine  15 mg Oral QHS  . oxyCODONE  80 mg Oral Q8H  . pantoprazole  40 mg Oral QAC breakfast  . polyethylene glycol  17 g Oral Daily  . ranolazine  500 mg Oral BID  . senna-docusate  1 tablet Oral BID  . silver sulfADIAZINE   Topical Daily  . sucralfate  1 g Oral QID  . tamsulosin  0.4 mg Oral QPC supper   Continuous Infusions: . sodium chloride 125 mL/hr at 11/11/20 0906  . ceFEPime (MAXIPIME) IV 2 g (11/12/20 0504)  . metronidazole 500 mg (11/12/20 0920)  . vancomycin 1,500 mg (11/12/20 0920)     LOS: 6 days       Bonnielee Haff, MD [facility]   If 7PM-7AM, please contact  night-coverage 11/12/2020, 10:17 AM

## 2020-11-12 NOTE — Consult Note (Signed)
Consultation Note Date: 11/12/2020   Patient Name: Renee Mack  DOB: 1965-12-30  MRN: [id number]  Age / Sex: 55 y.o., female  PCP: Olin Hauser, DO Referring Physician: Bonnielee Haff, MD  Reason for Consultation: Establishing goals of care  HPI/Patient Profile: Renee Mack is a 55 y.o. female with medical history significant of HLD, DM, CAD with DES, sCHF with EF 45-50%, vulvar CA (s/p of surgery and radiation therapy), COPD, GERD, depression with anxiety, former smoker, pancreatitis, DKA, who presents with chest pain and left arm weakness.  Clinical Assessment and Goals of Care: Patient is sitting in bedside chair. She lives alone, she is widowed and has 5 children.   She discusses her struggles with cancer, and having radiation, and multiple procedures. She states she is in a place of palliative treatment for this. She discusses her arm and the weakness she continues to have. She states she has a lot of pain and anxiety.   We discussed her diagnosis, prognosis, GOC, EOL wishes disposition and options.  Created space and opportunity for patient  to explore thoughts and feelings regarding current medical information.   A detailed discussion was had today regarding advanced directives.  Concepts specific to code status, artifical feeding and hydration, IV antibiotics and rehospitalization were discussed.  The difference between an aggressive medical intervention path and a comfort care path was discussed.  Values and goals of care important to patient and family were attempted to be elicited.  Discussed limitations of medical interventions to prolong quality of life in some situations and discussed the concept of human mortality.  She states she wants to do whatever she can to live. She states she wants to be here for her children and is not ready to die. She states when she sits up eating she  does not have any issues with swallowing. She is willing to repeat a swallow eval and would be willing to make modifications or  accept a feeding tube if she truly had a swallow issue. She states she understands she has neck swelling post op.  Discussed rehab recommendations. She is afraid to go to a rehab facility because she does not have anyone to pay her rent and she is afraid they will kick her out of her apartment while she is in rehab. She states she has been homeless before and would rather be at home and die than to be homeless, but she would be amenable to going to rehab if this could be worked out. She attempted to call her landlord and left a message for them. I will return tomorrow. Spoke with SLP.       SUMMARY OF RECOMMENDATIONS   Full code/fullscope. I will f/u tomorrow morning.   Prognosis:   Unable to determine      Primary Diagnoses: Present on Admission: . Spinal stenosis of cervical region . Anxiety associated with depression . CAD (coronary artery disease) . Chronic systolic heart failure ([place]) . Hyperlipidemia LDL goal <70 . GERD (gastroesophageal reflux disease) . Chest pain .  Hemoptysis . Dilated bile duct . Vulvar cancer ([place])   I have reviewed the medical record, interviewed the patient and family, and examined the patient. The following aspects are pertinent.  Past Medical History:  Diagnosis Date  . Cancer ([place])    vulvular  . Cervical disc disease    a. 01/2018 MRI Cervical spine: Cervical spondylosis with multilevel disc and facet degeneration greatest at C5-6 and C6-7.  Moderate to sev R C5-6, mod L C5-6, and mod bilat C6-7 foraminal stenosis with multilevel mild foraminal stenosis.  Mild C5-6 and mod C6-7 canal stenosis. C6-7 central cord impingement w/ cord flattening.  . Chronic combined systolic (congestive) and diastolic (congestive) heart failure ([place])    a. 12/2017 Echo: EF 30-35%, mid-apicalanteroseptal, ant, and apical AK. Gr1 DD. Mild conc  LVH; b. 03/2018 Echo: EF 45-50%, antsept, ant HK. Mild MR. Nl LA size. Nl RV fxn.  Marland Kitchen COPD (chronic obstructive pulmonary disease) ([facility])    not on home oxygen  . Coronary artery disease    a. 12/2017 ACS/PCI: LAD 100p (2.25x26 Resolute Onyx DES), 74m (2.0x12 Resolute Onyx DES), 80d (2.0x15 Resolute Onyx DES), RCA 90p (non-dominant). EF 25-35%. Post-MI course complicated by CGS; b. 01/4131 NSTEMI/subacute thrombosis-->LAD 100 (PTCA + DES x 1); c. 01/2018 NSTEMI/PCI: LM min irregs, LAD 50-60p ISR/hazy (3.5x12 Resolute DES), 17m/d (underexpansion of prior stents-->PTCA). EF 45-50%.  Marland Kitchen GERD (gastroesophageal reflux disease)   . H/O degenerative disc disease   . Hypotension   . Ischemic cardiomyopathy    a. 12/2017 Echo: EF 30-35%; b. 03/2018 Echo: EF 45-50%.  . Myocardial infarction ([place])    a. 12/2017-->DES to LAD x 3.  . Pancreatitis   . Shingles   . Spinal stenosis   . Tobacco abuse   . Ulcer (traumatic) of oral mucosa   . Urinary incontinence   . VIN III (vulvar intraepithelial neoplasia III)    Social History   Socioeconomic History  . Marital status: Widowed    Spouse name: Not on file  . Number of children: 4  . Years of education: Not on file  . Highest education level: Not on file  Occupational History  . Occupation: disabled  Tobacco Use  . Smoking status: Former Smoker    Packs/day: 0.50    Years: 30.00    Pack years: 15.00    Types: Cigarettes    Quit date: 12/19/2017    Years since quitting: 2.9  . Smokeless tobacco: Former Systems developer  . Tobacco comment: quit 12/19/2017.  Vaping Use  . Vaping Use: Never used  Substance and Sexual Activity  . Alcohol use: No  . Drug use: No  . Sexual activity: Not Currently  Other Topics Concern  . Not on file  Social History Narrative   Lives in [place] by herself.  Does not routinely exercise.   Social Determinants of Health   Financial Resource Strain: Low Risk   . Difficulty of Paying Living Expenses: Not hard at all  Food  Insecurity: Not on file  Transportation Needs: Not on file  Physical Activity: Sufficiently Active  . Days of Exercise per Week: 7 days  . Minutes of Exercise per Session: 30 min  Stress: Not on file  Social Connections: Not on file   Family History  Problem Relation Age of Onset  . Non-Hodgkin's lymphoma Mother   . Osteoporosis Mother   . Lupus Sister   . Heart disease Brother   . Heart attack Brother   . Heart attack Father  Scheduled Meds: . acetaminophen  1,000 mg Oral TID  . amitriptyline  75 mg Oral QHS  . amLODipine  5 mg Oral Daily  . aspirin EC  81 mg Oral Daily  . dexamethasone (DECADRON) injection  4 mg Intravenous Q6H  . enoxaparin (LOVENOX) injection  40 mg Subcutaneous Q24H  . famotidine  20 mg Oral BID  . feeding supplement  237 mL Oral TID BM  . insulin aspart  0-5 Units Subcutaneous QHS  . insulin aspart  0-9 Units Subcutaneous TID WC  . insulin aspart protamine- aspart  10 Units Subcutaneous BID WC  . methocarbamol  500 mg Oral TID  . mirtazapine  15 mg Oral QHS  . multivitamin with minerals  1 tablet Oral Daily  . oxyCODONE  80 mg Oral Q8H  . pantoprazole  40 mg Oral QAC breakfast  . polyethylene glycol  17 g Oral Daily  . potassium chloride  20 mEq Oral BID  . ranolazine  500 mg Oral BID  . senna-docusate  1 tablet Oral BID  . silver sulfADIAZINE   Topical Daily  . sucralfate  1 g Oral QID  . tamsulosin  0.4 mg Oral QPC supper   Continuous Infusions: . sodium chloride 75 mL/hr at 11/12/20 1143  . ceFEPime (MAXIPIME) IV 2 g (11/12/20 1332)  . metronidazole 500 mg (11/12/20 0920)  . potassium chloride 10 mEq (11/12/20 1434)  . vancomycin 1,500 mg (11/12/20 0920)   PRN Meds:.acetaminophen, albuterol, bisacodyl, clonazePAM, dextromethorphan-guaiFENesin, hydrALAZINE, nitroGLYCERIN, ondansetron (ZOFRAN) IV, oxycodone Medications Prior to Admission:  Prior to Admission medications   Medication Sig Start Date End Date Taking? Authorizing Provider   albuterol (PROAIR HFA) 108 (90 Base) MCG/ACT inhaler Inhale 1 puff into the lungs every 6 (six) hours as needed for wheezing or shortness of breath.    Yes [provider]  aspirin 81 MG tablet Take 81 mg by mouth daily.   Yes [provider]  Blood Glucose Monitoring Suppl (GLUCOCOM BLOOD GLUCOSE MONITOR) DEVI 1 m. 11/15/17  Yes Karamalegos, Devonne Doughty, DO  clonazePAM (KLONOPIN) 1 MG tablet Take 1 mg by mouth 3 (three) times daily as needed. 03/19/20  Yes [provider]  esomeprazole (NEXIUM) 40 MG capsule Take 1 capsule (40 mg total) by mouth daily before breakfast. 09/22/20  Yes Karamalegos, Devonne Doughty, DO  famotidine (PEPCID) 20 MG tablet Take 1 tablet (20 mg total) by mouth 2 (two) times daily. 09/22/20  Yes Karamalegos, Devonne Doughty, DO  insulin isophane & regular human (NOVOLIN 70/30 FLEXPEN) (70-30) 100 UNIT/ML KwikPen Inject 18 Units into the skin 2 (two) times daily. 04/05/20  Yes Deatra James, MD  Lancet Devices (SIMPLE DIAGNOSTICS LANCING DEV) MISC AS DIRECTED 11/15/17  Yes Karamalegos, Devonne Doughty, DO  Lidocaine HCl 4 % GEL Apply topically. 02/05/20  Yes [provider]  mirtazapine (REMERON) 15 MG tablet Take 15 mg by mouth at bedtime. 07/07/18  Yes [provider]  nitroGLYCERIN (NITROSTAT) 0.4 MG SL tablet Place 1 tablet (0.4 mg total) under the tongue every 5 (five) minutes as needed for chest pain. 06/16/18  Yes Gouru, Aruna, MD  ondansetron (ZOFRAN-ODT) 4 MG disintegrating tablet Take 4 mg by mouth every 8 (eight) hours as needed. 11/27/19  Yes [provider]  oxyCODONE (OXYCONTIN) 80 mg 12 hr tablet Take 1 tablet by mouth every 8 (eight) hours.   Yes [provider]  oxycodone (ROXICODONE) 30 MG immediate release tablet Take 30 mg by mouth every 3 (three) hours  as needed. 04/14/20  Yes [provider]  SSD 1 % cream SMARTSIG:1 Topical 4-5 Times Daily 01/25/20  Yes [provider]  sucralfate (CARAFATE) 1  g tablet Take 1 g by mouth 4 (four) times daily. 03/01/20  Yes [provider]  ticagrelor (BRILINTA) 90 MG TABS tablet Take 1 tablet (90 mg total) by mouth 2 (two) times daily. *NEEDS OFFICE VISIT FOR FURTHER REFILLS-PLEASE CALL [phone] TO SCHEDULE.* 05/14/19  Yes Theora Gianotti, NP  amitriptyline (ELAVIL) 25 MG tablet Take 1 tablet (25 mg total) by mouth at bedtime. Patient taking differently: Take 75 mg by mouth at bedtime. 04/05/20 05/05/20  Deatra James, MD  atorvastatin (LIPITOR) 80 MG tablet Take 1 tablet (80 mg total) by mouth daily at 6 PM. 03/05/19 03/30/20  Theora Gianotti, NP  methocarbamol (ROBAXIN) 500 MG tablet Take 1 tablet by mouth 4 (four) times daily as needed. 08/01/20   [provider]  ranolazine (RANEXA) 500 MG 12 hr tablet Take 1 tablet (500 mg total) by mouth 2 (two) times daily. 04/05/20 05/05/20  Deatra James, MD   Allergies  Allergen Reactions  . Bee Venom Itching, Shortness Of Breath and Swelling  . Metformin And Related Shortness Of Breath and Swelling  . Darvon [Propoxyphene] Itching  . Gabapentin Swelling  . Nsaids Other (See Comments)    Ulcers   . Tramadol Hives  . Contrast Media [Iodinated Diagnostic Agents] Rash    If you benadryl, and steroids she is able to take the contrast per pt   Review of Systems  Neurological: Positive for weakness.    Physical Exam Pulmonary:     Effort: Pulmonary effort is normal.  Neurological:     Mental Status: She is alert.     Vital Signs: BP 127/73 (BP Location: Left Arm)   Pulse 85   Temp 98 F (36.7 C) (Oral)   Resp 18   Ht 5\' 1"  (1.549 m)   Wt 58 kg   SpO2 96%   BMI 24.15 kg/m  Pain Scale: 0-10 POSS *See Group Information*: 1-Acceptable,Awake and alert Pain Score: 4    SpO2: SpO2: 96 % O2 Device:SpO2: 96 % O2 Flow Rate: .O2 Flow Rate (L/min): 2 L/min  IO: Intake/output summary:   Intake/Output Summary (Last 24 hours) at 11/12/2020 1558 Last  data filed at 11/12/2020 0504 Gross per 24 hour  Intake 4271.93 ml  Output --  Net 4271.93 ml    LBM: Last BM Date: 11/05/20 Baseline Weight: Weight: 58.5 kg Most recent weight: Weight: 58 kg        Time In: 3:30 Time Out: 4:15 Time Total: 45 min Greater than 50%  of this time was spent counseling and coordinating care related to the above assessment and plan.  Signed by: Asencion Gowda, NP   Please contact Palliative Medicine Team phone at [phone] for questions and concerns.  For individual provider: See Shea Evans

## 2020-11-12 NOTE — Progress Notes (Signed)
Physical Therapy Treatment Patient Details Name: Renee Mack MRN: [id number] DOB: 09-05-1965 Today's Date: 11/12/2020    History of Present Illness presented to ER secondary to chest pain, L UE weakness/numbness; admitted for management of spinal stenosis of cervical region, s/p emergent C5-7 ACDF (11/06/20). Of note, currently undergoing radiation therapy for vulvular cancer.    PT Comments    Patient alert, in bed, agreeable to PT. Reported 7/10 anterior neck pain. The patient was very motivated for session today. Supervision bed mobility, sit <> stand handheld assist to [facility] and supervision for pericare in standing. BP assessed in semi supine, sitting, and ~29minutes after standing, WFLs. Remainder of session focused on ambulation. She was able to ambulate ~197ft, and then an additional 24ft after a seated rest break led by PT. CGA with RW, decreased gait velocity noted, no LOB. Pt returned to room up in chair with all needs in reach. The patient would benefit from further skilled PT intervention to continue to progress towards goals. Recommendation remains appropriate.   BP semi supine: 111/62, sitting 114/63, and after 2 minutes of standing 112/56.    Follow Up Recommendations  CIR     Equipment Recommendations  Rolling walker with 5" wheels    Recommendations for Other Services       Precautions / Restrictions Precautions Precautions: Fall;Cervical Restrictions Weight Bearing Restrictions: No    Mobility  Bed Mobility Overal bed mobility: Needs Assistance Bed Mobility: Supine to Sit       Sit to supine: Supervision        Transfers Overall transfer level: Needs assistance Equipment used: Rolling walker (2 wheeled) Transfers: Sit to/from Stand Sit to Stand: Min guard         General transfer comment: pt able to place LUE this PM, cued for RW safety  Ambulation/Gait Ambulation/Gait assistance: Min guard Gait Distance (Feet):  (186ft, seated rest break led by  PT, additional 55ft) Assistive device: Rolling walker (2 wheeled) Gait Pattern/deviations: Step-through pattern     General Gait Details: pt highly motivated   Marine scientist [place]in (Stroke Patients Only)       Balance Overall balance assessment: Needs assistance Sitting-balance support: No upper extremity supported;Feet supported Sitting balance-Leahy Scale: Good     Standing balance support: Bilateral upper extremity supported Standing balance-Leahy Scale: Fair                              Cognition Arousal/Alertness: Awake/alert Behavior During Therapy: WFL for tasks assessed/performed Overall Cognitive Status: Within Functional Limits for tasks assessed                                        Exercises Other Exercises Other Exercises: pivot to [facility] with handheld assist, supervision for pericare in standing    General Comments        Pertinent Vitals/Pain Pain Assessment: 0-10 Pain Score: 7  Pain Location: anterior neck Pain Descriptors / Indicators: Guarding;Grimacing;Moaning;Discomfort Pain Intervention(s): Limited activity within patient's tolerance;Monitored during session;Repositioned;Premedicated before session    Home Living                      Prior Function            PT Goals (current goals  can now be found in the care plan section) Progress towards PT goals: Progressing toward goals    Frequency    7X/week      PT Plan Current plan remains appropriate    Co-evaluation              AM-PAC PT "6 Clicks" Mobility   Outcome Measure  Help needed turning from your back to your side while in a flat bed without using bedrails?: None Help needed moving from lying on your back to sitting on the side of a flat bed without using bedrails?: None Help needed moving to and from a bed to a chair (including a wheelchair)?: A Little Help needed standing up from a  chair using your arms (e.g., wheelchair or bedside chair)?: A Little Help needed to walk in hospital room?: A Little Help needed climbing 3-5 steps with a railing? : A Lot 6 Click Score: 19    End of Session Equipment Utilized During Treatment: Gait belt Activity Tolerance: Patient tolerated treatment well;Other (comment);Treatment limited secondary to medical complications (Comment) (limited by low BP) Patient left: in chair;with chair alarm set;with call bell/phone within reach Nurse Communication: Mobility status PT Visit Diagnosis: Muscle weakness (generalized) (M62.81);Difficulty in walking, not elsewhere classified (R26.2)     Time: 8185-6314 PT Time Calculation (min) (ACUTE ONLY): 27 min  Charges:  $Therapeutic Exercise: 23-37 mins                     Lieutenant Diego PT, DPT 2:52 PM,11/12/20

## 2020-11-12 NOTE — Progress Notes (Signed)
    Attending Progress Note  History: Renee Mack is here for progressive weakness and cervical myeloradiculopathy.  11/06/20 - ACDF C5-7  POD6: Doing better.  Steroids have helped her swallowing  POD1: C/o neck pain.  L arm is feeling stronger.  No issues swallowing.  POD0: Doing well in PACU.  Feels left arm more than prior to surgery.  Physical Exam: Vitals:   11/12/20 1123 11/12/20 1554  BP: 133/64 127/73  Pulse: 96 85  Resp: 17 18  Temp: 98.3 F (36.8 C) 98 F (36.7 C)  SpO2: 96%     AA Ox3 CNI  Strength:5/5 throughout RUE LUE with 4/5 biceps, deltoids, and triceps. L wrist drop 2/5, finger drop 2/5 Incision c/d/i Trachea midline  Data:  Recent Labs  Lab 11/09/20 1105 11/11/20 0859 11/12/20 0847  NA 138 137 139  K 3.5 3.6 2.9*  CL 106 98 105  CO2 22 25 24   BUN 10 9 10   CREATININE 0.46 0.63 0.55  GLUCOSE 113* 264* 105*  CALCIUM 8.7* 9.0 8.3*   Recent Labs  Lab 11/11/20 0859  AST 115*  ALT 67*  ALKPHOS 224*     Recent Labs  Lab 11/09/20 1105 11/11/20 0859 11/12/20 0847  WBC 6.1 13.8* 10.2  HGB 14.3 14.7 12.0  HCT 44.1 45.0 36.7  PLT 221 232 202   Recent Labs  Lab 11/06/20 0629 11/11/20 1040  APTT 27 33  INR 1.0 1.1         Other tests/results: xrays done - good position  Assessment/Plan:  Renee Mack is stable from ACDF. She is having some post-operative dysphagia that has improve with steroids.  - mobilize - pain control - DVT prophylaxis - PTOT - steroids for 24 hours - Other care per primary team.  I encouraged inpatient rehab.   Meade Maw MD, [facility] Department of Neurosurgery

## 2020-11-12 NOTE — Progress Notes (Signed)
  Speech Language Pathology Treatment: Dysphagia  Patient Details Name: Renee Mack MRN: [id number] DOB: January 04, 1966 Today's Date: 11/12/2020 Time: 0177-9390 SLP Time Calculation (min) (ACUTE ONLY): 20 min  Assessment / Plan / Recommendation Clinical Impression  Pt has been fully informed of high aspiration risk when consuming thin liquids and puree. She voiced understanding of these risks and has chosen to consume thin liquids and puree. Charge nurse was available and witnessed pt as she expressed understanding of aspiration risk and pt's choice to proceed with choice to consume thin liquids and puree. Pt stated, "I will sit all the way upright, take small sips and be really careful so that I won't choke. If I start coughing, I will stop for a bit."   At this time, skilled ST intervention is no longer indicated. Will defer to MD to place diet order based on pt's wishes.  Continue to recommend Palliative Care.    HPI HPI: Presented to ER secondary to chest pain, L UE weakness/numbness; admitted for management of spinal stenosis of cervical region, s/p emergent C5-7 ACDF (11/06/20). Of note, currently undergoing radiation therapy for vulvular cancer.      SLP Plan  Discharge SLP treatment due to (comment) (pt wishes to consume POs not currently recommended)       Recommendations  Diet recommendations:  (defer to MD) Compensations: Minimize environmental distractions;Slow rate;Small sips/bites Postural Changes and/or Swallow Maneuvers: Seated upright 90 degrees                Oral Care Recommendations: Oral care QID Follow up Recommendations:  (Palliative Care) SLP Visit Diagnosis: Dysphagia, oropharyngeal phase (R13.12) Plan: Discharge SLP treatment due to (comment) (pt wishes to consume POs not currently recommended)       GO               Liani Caris B. Rutherford Nail M.S., CCC-SLP, [place] Office [phone]  Mckynzie Liwanag  Rutherford Nail 11/12/2020, 9:05 AM

## 2020-11-12 NOTE — Progress Notes (Signed)
Occupational Therapy Treatment Patient Details Name: Renee Mack MRN: [id number] DOB: [birth year] Today's Date: 11/12/2020    History of present illness presented to ER secondary to chest pain, L UE weakness/numbness; admitted for management of spinal stenosis of cervical region, s/p emergent C5-7 ACDF (11/06/20). Of note, currently undergoing radiation therapy for vulvular cancer.   OT comments  Pt seen for OT tx this date to f/u re: safety with ADLs/ADL mobility. OT engages pt in functional transfers including STS from chair and commode transfer. Pt requires MIN Cues for safe hand placement with ADL transfers including SPS with CGA from chair to [facility] with RW. OT engages pt in wrist ROM and stretching in all functional planes with pt showing some increased INDEP with AAROM for wrist ulnar and radial deviation as well as digit abduction. Pt still with very limited to no active wrist extension, requires PROM to stretch to prevent shortening of connective tissue. Educated on rationale. Pt engaged in fxl mobility to complete steps from [facility] to bed after toileting (~4-5 steps and then pivot). Pt requires only cues and CGA for control of descent to EOB sitting. Pt left in sitting as RN presents to administer medication. Will continue to follow acutely. Pt continues to benefit from skilled OT services to progress functional use of L hand as well as general safety with ADLs and ADL mobility. Continue to anticipate pt will require extensive rehabilitation to reach highest attainable level of INDEP and safety with self care and fxl mobility.   Follow Up Recommendations  CIR    Equipment Recommendations  3 in 1 bedside commode    Recommendations for Other Services      Precautions / Restrictions Precautions Precautions: Fall;Cervical Restrictions Weight Bearing Restrictions: No       Mobility Bed Mobility Overal bed mobility: Needs Assistance Bed Mobility: Supine to Sit       Sit to supine:  Supervision   General bed mobility comments: pt assisted to EOB sitting with OT, but RN presents to administer medication and CNA presents to take BG, therefore, pt left in EOB sitting.    Transfers Overall transfer level: Needs assistance Equipment used: Rolling walker (2 wheeled) Transfers: Sit to/from [facility] Sit to Stand: Min guard Stand pivot transfers: Min guard;Min assist       General transfer comment: SPS from chair to [facility], then 4-5 steps with RW from [facility] to EOB sitting. Cues for safe hand placement with more functional R hand and use of L hand to stay up on RW for stabilization only.    Balance Overall balance assessment: Needs assistance Sitting-balance support: No upper extremity supported;Feet supported Sitting balance-Leahy Scale: Good     Standing balance support: Bilateral upper extremity supported Standing balance-Leahy Scale: Fair                             ADL either performed or assessed with clinical judgement   ADL Overall ADL's : Needs assistance/impaired     Grooming: Wash/dry hands;Set up;Sitting                   Toilet Transfer: Min guard;RW;Stand-pivot;BSC   Toileting- Clothing Manipulation and Hygiene: Minimal assistance;Sit to/from stand Toileting - Clothing Manipulation Details (indicate cue type and reason): to perform anterior peri care after voiding     Functional mobility during ADLs: Min guard;Rolling walker (to take 4-5 steps from [facility] to bed with RW)  Vision Patient Visual Report: No change from baseline     Perception     Praxis      Cognition Arousal/Alertness: Awake/alert Behavior During Therapy: WFL for tasks assessed/performed Overall Cognitive Status: Within Functional Limits for tasks assessed                                          Exercises Other Exercises Other Exercises: Cues for safe hand placement with ADL transfers including SPS with CGA from  chair to [facility]. OT engages pt in wrist ROM and stretching in all functional planes with pt showing some increased INDEP with AAROM for wrist ulnar and radial deviation as well as digit abduction. Pt still with very limited to no active wrist extension, requires PROM to stretch to prevent shortening of connective tissue. Educated on rationale.   Shoulder Instructions       General Comments      Pertinent Vitals/ Pain       Pain Assessment: Faces Pain Score: 7  Faces Pain Scale: Hurts little more Pain Location: anterior neck Pain Descriptors / Indicators: Guarding;Grimacing Pain Intervention(s): Monitored during session;Repositioned  Home Living                                          Prior Functioning/Environment              Frequency  Min 3X/week        Progress Toward Goals  OT Goals(current goals can now be found in the care plan section)  Progress towards OT goals: Progressing toward goals  Acute Rehab OT Goals Patient Stated Goal: to get back home OT Goal Formulation: With patient Time For Goal Achievement: 11/21/20 Potential to Achieve Goals: Good  Plan Discharge plan remains appropriate;Frequency remains appropriate    Co-evaluation                 AM-PAC OT "6 Clicks" Daily Activity     Outcome Measure   Help from another person eating meals?: A Little Help from another person taking care of personal grooming?: A Little Help from another person toileting, which includes using toliet, bedpan, or urinal?: A Little Help from another person bathing (including washing, rinsing, drying)?: A Lot Help from another person to put on and taking off regular upper body clothing?: A Lot Help from another person to put on and taking off regular lower body clothing?: A Lot 6 Click Score: 15    End of Session Equipment Utilized During Treatment: Gait belt;Rolling walker  OT Visit Diagnosis: Other abnormalities of gait and mobility  (R26.89);Muscle weakness (generalized) (M62.81);Pain;Hemiplegia and hemiparesis Hemiplegia - Right/Left: Left Hemiplegia - dominant/non-dominant: Non-Dominant Hemiplegia - caused by: Unspecified Pain - Right/Left: Left Pain - part of body: Shoulder;Arm (cervical region)   Activity Tolerance Patient tolerated treatment well   Patient Left with nursing/sitter in room;with call bell/phone within reach (seated EOB with RN administerring medication)   Nurse Communication Patient requests pain meds        Time: 0092-3300 OT Time Calculation (min): 29 min  Charges: OT General Charges $OT Visit: 1 Visit OT Treatments $Self Care/Home Management : 8-22 mins $Therapeutic Activity: 8-22 mins  Gerrianne Scale, [place], OTR/L ascom [phone] 11/12/20, 5:56 PM

## 2020-11-12 NOTE — TOC Progression Note (Signed)
Transition of Care (TOC) - Progression Note    Patient Details  Name: Shaniquia Cabeza MRN: [id number] Date of Birth: 05/12/1966  Transition of Care (TOC) CM/SW Contact   J , RN Phone Number: 11/12/2020, 9:18 AM  Clinical Narrative:   Met with the patient in the room, she continues to Refuse to go to CIR or to SNF, She stated she is going home, she has family and friends and neighbors that will help her, her daughter Roxy provides transportation, She has a rollator at home and a high rise toilet, she stated that she does not want or need any other DME, she would like [facility] for HH PT and aide, I notified [facility] at [facility]. Will continue to monitor for additional needs         Expected Discharge Plan and Services                                                 Social Determinants of Health (SDOH) Interventions    Readmission Risk Interventions Readmission Risk Prevention Plan 10/11/2020 04/01/2020  Transportation Screening Complete Complete  PCP or Specialist Appt within 3-5 Days Complete Complete  HRI or Home Care Consult Complete -  Social Work Consult for Recovery Care Planning/Counseling Complete Complete  Palliative Care Screening Complete Not Applicable  Medication Review (RN Care Manager) Complete Complete  Some recent data might be hidden   

## 2020-11-13 DIAGNOSIS — T17908A Unspecified foreign body in respiratory tract, part unspecified causing other injury, initial encounter: Secondary | ICD-10-CM | POA: Diagnosis not present

## 2020-11-13 DIAGNOSIS — M4802 Spinal stenosis, cervical region: Secondary | ICD-10-CM | POA: Diagnosis not present

## 2020-11-13 DIAGNOSIS — Z7189 Other specified counseling: Secondary | ICD-10-CM | POA: Diagnosis not present

## 2020-11-13 DIAGNOSIS — E119 Type 2 diabetes mellitus without complications: Secondary | ICD-10-CM | POA: Diagnosis not present

## 2020-11-13 DIAGNOSIS — E876 Hypokalemia: Secondary | ICD-10-CM | POA: Diagnosis not present

## 2020-11-13 LAB — MAGNESIUM: Magnesium: 2 mg/dL (ref 1.7–2.4)

## 2020-11-13 LAB — COMPREHENSIVE METABOLIC PANEL
ALT: 34 U/L (ref 0–44)
AST: 31 U/L (ref 15–41)
Albumin: 2.6 g/dL — ABNORMAL LOW (ref 3.5–5.0)
Alkaline Phosphatase: 177 U/L — ABNORMAL HIGH (ref 38–126)
Anion gap: 8 (ref 5–15)
BUN: 13 mg/dL (ref 6–20)
CO2: 24 mmol/L (ref 22–32)
Calcium: 8.7 mg/dL — ABNORMAL LOW (ref 8.9–10.3)
Chloride: 103 mmol/L (ref 98–111)
Creatinine, Ser: 0.59 mg/dL (ref 0.44–1.00)
GFR, Estimated: >60 mL/min (ref >60)
Glucose, Bld: 391 mg/dL — ABNORMAL HIGH (ref 70–99)
Potassium: 4.1 mmol/L (ref 3.5–5.1)
Sodium: 135 mmol/L (ref 135–145)
Total Bilirubin: 0.6 mg/dL (ref 0.3–1.2)
Total Protein: 6.2 g/dL — ABNORMAL LOW (ref 6.5–8.1)

## 2020-11-13 LAB — CBC
HCT: 34.9 % — ABNORMAL LOW (ref 36.0–46.0)
Hemoglobin: 11.6 g/dL — ABNORMAL LOW (ref 12.0–15.0)
MCH: 30.6 pg (ref 26.0–34.0)
MCHC: 33.2 g/dL (ref 30.0–36.0)
MCV: 92.1 fL (ref 80.0–100.0)
Platelets: 225 10*3/uL (ref 150–400)
RBC: 3.79 MIL/uL — ABNORMAL LOW (ref 3.87–5.11)
RDW: 12.8 % (ref 11.5–15.5)
WBC: 8.6 10*3/uL (ref 4.0–10.5)
nRBC: 0 % (ref 0.0–0.2)

## 2020-11-13 LAB — GLUCOSE, CAPILLARY
Glucose-Capillary: 370 mg/dL — ABNORMAL HIGH (ref 70–99)
Glucose-Capillary: 360 mg/dL — ABNORMAL HIGH (ref 70–99)
Glucose-Capillary: 391 mg/dL — ABNORMAL HIGH (ref 70–99)
Glucose-Capillary: 403 mg/dL — ABNORMAL HIGH (ref 70–99)
Glucose-Capillary: 393 mg/dL — ABNORMAL HIGH (ref 70–99)

## 2020-11-13 MED ORDER — INSULIN ASPART PROT & ASPART (70-30 MIX) 100 UNIT/ML ~~LOC~~ SUSP
5.0000 [IU] | Freq: Once | SUBCUTANEOUS | Status: AC
  Administered 2020-11-13: 5 [IU] via SUBCUTANEOUS
  Filled 2020-11-13: qty 10

## 2020-11-13 MED ORDER — TICAGRELOR 90 MG PO TABS
90.0000 mg | ORAL_TABLET | Freq: Two times a day (BID) | ORAL | Status: DC
  Administered 2020-11-13 – 2020-11-14 (×3): 90 mg via ORAL
  Filled 2020-11-13 (×4): qty 1

## 2020-11-13 MED ORDER — DEXAMETHASONE 4 MG PO TABS
4.0000 mg | ORAL_TABLET | Freq: Two times a day (BID) | ORAL | Status: DC
  Administered 2020-11-13: 4 mg via ORAL
  Filled 2020-11-13 (×2): qty 1

## 2020-11-13 MED ORDER — INSULIN ASPART PROT & ASPART (70-30 MIX) 100 UNIT/ML ~~LOC~~ SUSP
15.0000 [IU] | Freq: Two times a day (BID) | SUBCUTANEOUS | Status: DC
  Administered 2020-11-13: 15 [IU] via SUBCUTANEOUS
  Filled 2020-11-13: qty 10

## 2020-11-13 MED ORDER — AMOXICILLIN-POT CLAVULANATE 875-125 MG PO TABS
1.0000 | ORAL_TABLET | Freq: Two times a day (BID) | ORAL | Status: DC
  Administered 2020-11-13 – 2020-11-14 (×3): 1 via ORAL
  Filled 2020-11-13 (×3): qty 1

## 2020-11-13 MED ORDER — SACCHAROMYCES BOULARDII 250 MG PO CAPS
250.0000 mg | ORAL_CAPSULE | Freq: Two times a day (BID) | ORAL | Status: DC
  Administered 2020-11-13 – 2020-11-14 (×3): 250 mg via ORAL
  Filled 2020-11-13 (×4): qty 1

## 2020-11-13 NOTE — Progress Notes (Signed)
Inpatient Diabetes Program Recommendations  AACE/ADA: New Consensus Statement on Inpatient Glycemic Control   Target Ranges:  Prepandial:   less than 140 mg/dL      Peak postprandial:   less than 180 mg/dL (1-2 hours)      Critically ill patients:  140 - 180 mg/dL   Results for Renee Mack, Renee Mack (MRN [id number]) as of 11/13/2020 07:24  Ref. Range 11/12/2020 07:43 11/12/2020 11:36 11/12/2020 16:24 11/12/2020 20:33 11/13/2020 1:03 11/13/2020 6:30 11/13/2020 07:15  Glucose-Capillary Latest Ref Range: 70 - 99 mg/dL 101 (H)  70/30 10 units   Decadron 4 mg  112 (H) 345 (H)  Novolog 7 units  70/30 10 units 366 (H)  Novolog 5 units       Decadron 4 mg       Decadron 4 mg 370 (H)   Review of Glycemic Control  Diabetes history: DM2 Outpatient Diabetes medications: 70/30 18 units BID Current orders for Inpatient glycemic control: 70/30 10 units BID, Novolog 0-9 units TID with meals, Novolog 0-5 units QHS; Decadron 4 mg Q6H  Inpatient Diabetes Program Recommendations:    Insulin: If steroids are continued as ordered, please consider increasing 70/30 to 15 units BID.  Thanks, Barnie Alderman, RN, MSN, CDE Diabetes Coordinator Inpatient Diabetes Program [phone] (Team Pager from 8am to 5pm)

## 2020-11-13 NOTE — Progress Notes (Signed)
   11/11/20 0735  Assess: MEWS Score  Temp 100.2 F (37.9 C)  BP (!) 148/87  Pulse Rate (!) 127  Resp 16  SpO2 (!) 89 %  Assess: MEWS Score  MEWS Temp 0  MEWS Systolic 0  MEWS Pulse 2  MEWS RR 0  MEWS LOC 0  MEWS Score 2  MEWS Score Color Yellow  Assess: if the MEWS score is Yellow or Red  Were vital signs taken at a resting state? Yes  Focused Assessment Change from prior assessment (see assessment flowsheet)  Early Detection of Sepsis Score *See Row Information* High  MEWS guidelines implemented *See Row Information* Yes  Treat  MEWS Interventions Administered scheduled meds/treatments  Take Vital Signs  Increase Vital Sign Frequency  Yellow: Q 2hr X 2 then Q 4hr X 2, if remains yellow, continue Q 4hrs  Escalate  MEWS: Escalate Yellow: discuss with charge nurse/RN and consider discussing with provider and RRT  Notify: Charge Nurse/RN  Name of Charge Nurse/RN Notified Kim, RN  Date Charge Nurse/RN Notified 11/11/20  Time Charge Nurse/RN Notified [phone]  Inserted for Renee Medal LPN

## 2020-11-13 NOTE — Progress Notes (Signed)
Physical Therapy Treatment Patient Details Name: Renee Mack MRN: [id number] DOB: May 09, 1966 Today's Date: 11/13/2020    History of Present Illness presented to ER secondary to chest pain, L UE weakness/numbness; admitted for management of spinal stenosis of cervical region, s/p emergent C5-7 ACDF (11/06/20). Of note, currently undergoing radiation therapy for vulvular cancer.    PT Comments    Pt up in bed, reported 7/10 pain but agreeable to PT prior to more pain medication. The patient demonstrated excellent progress towards goals this session. Bed mobility with supervision, able to stand pivot to [facility] for CGA, no AD (unilateral support on BSC arm). While sitting on BSC she was able to don underwear and pad independently, some difficulty noted due to L hand but able to complete with extended time. Sit <> stand with RW and supervision, and then ambulated ~232ft with RW and CGA. No LOB, steady. Decreased gait velocity and pt able to converse throughout. Pt did endorse some anxiety towards end of session, nursing staff notified of pt request for anxiety medication and pain medication. Pt up in chair, all needs in reach. Wet cough noted while drinking diet coke. Due to pt's progress with mobility, recommendation at this time updated to HHPT with supervision intermittently. Pt speaks of family, friends, and neighbor support.     Follow Up Recommendations  Home health PT;Supervision - Intermittent     Equipment Recommendations  Rolling walker with 5" wheels    Recommendations for Other Services       Precautions / Restrictions Precautions Precautions: Fall;Cervical Restrictions Weight Bearing Restrictions: No    Mobility  Bed Mobility Overal bed mobility: Needs Assistance Bed Mobility: Supine to Sit   Sidelying to sit: Supervision            Transfers Overall transfer level: Needs assistance Equipment used: Rolling walker (2 wheeled);None Transfers: Sit to/from [employer] to Stand: Supervision Stand pivot transfers: Min guard          Ambulation/Gait   Gait Distance (Feet): 200 Feet Assistive device: Rolling walker (2 wheeled) Gait Pattern/deviations: Step-through pattern     General Gait Details: decreased velocity, steady, safe   Stairs             Wheelchair Mobility    Modified Rankin (Stroke Patients Only)       Balance Overall balance assessment: Needs assistance Sitting-balance support: Feet supported Sitting balance-Leahy Scale: Good Sitting balance - Comments: pt able to don brief and pad while sitting on BSC independently       Standing balance comment: pt able to stand pivot with CGA, no AD. use of BSC arm for unilateral UE support                            Cognition Arousal/Alertness: Awake/alert Behavior During Therapy: WFL for tasks assessed/performed Overall Cognitive Status: Within Functional Limits for tasks assessed                                 General Comments: oriented to self, place, situation. Time spent discussing discharge options      Exercises      General Comments        Pertinent Vitals/Pain Pain Assessment: Faces Pain Score: 7  Pain Location: anterior neck Pain Descriptors / Indicators: Sore Pain Intervention(s): Limited activity within patient's tolerance;Repositioned    Home Living  Prior Function            PT Goals (current goals can now be found in the care plan section) Progress towards PT goals: Progressing toward goals    Frequency    7X/week      PT Plan Discharge plan needs to be updated    Co-evaluation              AM-PAC PT "6 Clicks" Mobility   Outcome Measure  Help needed turning from your back to your side while in a flat bed without using bedrails?: None   Help needed moving to and from a bed to a chair (including a wheelchair)?: None Help needed standing up from  a chair using your arms (e.g., wheelchair or bedside chair)?: None Help needed to walk in hospital room?: None Help needed climbing 3-5 steps with a railing? : A Little 6 Click Score: 19    End of Session Equipment Utilized During Treatment: Gait belt Activity Tolerance: Patient tolerated treatment well Patient left: in chair;with chair alarm set;with call bell/phone within reach Nurse Communication: Mobility status PT Visit Diagnosis: Muscle weakness (generalized) (M62.81);Difficulty in walking, not elsewhere classified (R26.2)     Time: 1848-5927 PT Time Calculation (min) (ACUTE ONLY): 29 min  Charges:  $Therapeutic Exercise: 8-22 mins $Therapeutic Activity: 8-22 mins                    Lieutenant Diego PT, DPT 10:18 AM,11/13/20

## 2020-11-13 NOTE — Progress Notes (Signed)
Dr. Damita Dunnings (on-call MD) notified via secure chat "pt's BG-[id number]; per order she is only to receive 5 units and to notify MD for > 400.  FYI; pt is on steroids."  MD replied "give the 5 units. thanks. you can recheck around midnight."

## 2020-11-13 NOTE — Plan of Care (Signed)
  Problem: Activity: Goal: Ability to avoid complications of mobility impairment will improve Outcome: Progressing   

## 2020-11-13 NOTE — Progress Notes (Signed)
Occupational Therapy Treatment Patient Details Name: Renee Mack MRN: [id number] DOB: 31-Jan-1966 Today's Date: 11/13/2020    History of present illness presented to ER secondary to chest pain, L UE weakness/numbness; admitted for management of spinal stenosis of cervical region, s/p emergent C5-7 ACDF (11/06/20). Of note, currently undergoing radiation therapy for vulvular cancer.   OT comments  Pt seen for OT tx this date to f/u re: safety with ADLs/ADL mobility. Pt demos significant progress with fxl mobility, balance, activity tolerance, safety awareness and shld/elbow ROM of L UE. This date, pt able to perform fxl mobility to/from restroom with no AD with SUPV only and demos no LOB or sway. Pt with good control for ADL transfers with MOD I. In addition, OT engages pt in standing self care sink-side and pt able to perform one-handed tasks with MOD I, but requires SETUP for opening ADL containers including lids and screw-tops such as toothpaste d/t limited wrist extension of L UE. OT engages pt in AROM/AAROM of shoulder in all functional planes with pt demonstrating flexion and adbuction to full arc of motion. Pt also with improved ulnar and radial deviation as well as digit flex/ext. Grip strength and wrist flexion are still significantly limited which limits pt's ability to perform some ADLs and [facility] tasks. Will continue to follow acutely, but as pt has vastly progressed with mobility and tolerance for performance of self care ADLs, updated d/c recommendation to reflect progress to [place].    Follow Up Recommendations  Home health OT    Equipment Recommendations  3 in 1 bedside commode;Other (comment) (L wrist cock up splint with thumb spica)    Recommendations for Other Services      Precautions / Restrictions Precautions Precautions: Fall;Cervical Restrictions Weight Bearing Restrictions: No       Mobility Bed Mobility Overal bed mobility: Modified Independent                   Transfers Overall transfer level: Needs assistance Equipment used: None Transfers: Sit to/from [employer] Transfers Sit to Stand: Supervision;Modified independent (Device/Increase time) Stand pivot transfers: Supervision;Modified independent (Device/Increase time)       General transfer comment: no LOB with STS to/from commode as well as to/from EOB. No use of AD. Good control and stability.    Balance Overall balance assessment: Needs assistance Sitting-balance support: Feet supported Sitting balance-Leahy Scale: Normal Sitting balance - Comments: normal static sitting, G dynamic   Standing balance support: Bilateral upper extremity supported Standing balance-Leahy Scale: Good Standing balance comment: no AD, no LOB, demos good control and stability.                           ADL either performed or assessed with clinical judgement   ADL Overall ADL's : Needs assistance/impaired Eating/Feeding: Set up;Minimal assistance Eating/Feeding Details (indicate cue type and reason): assist for coordination skills such as opening condiments/lids Grooming: Set up;Standing Grooming Details (indicate cue type and reason): G balance standing sink-side to wash hands. Requires SETUP for opening screw top ADL items d/t decreased wrist stability and extension in L hand. Improved shld flexion, able to comb her hair in standing although effortful with L hand and she primarily resorts to use of R hand.                 Toilet Transfer: Supervision/safety;Modified Independent;Ambulation;Grab bars   Toileting- Clothing Manipulation and Hygiene: Modified independent;Sit to/from stand       Functional  mobility during ADLs: Supervision/safety (no AD, to/from restroom w/ no LOB)       Vision Patient Visual Report: No change from baseline     Perception     Praxis      Cognition Arousal/Alertness: Awake/alert Behavior During Therapy: WFL for tasks  assessed/performed Overall Cognitive Status: Within Functional Limits for tasks assessed                                          Exercises Other Exercises Other Exercises: OT engages pt in radial/ulnar deviation of L wrist, self-assited with R hand with OT demonstrating and pt with good return demo. OT engages pt in AROM/AAROM of L shoulder flexion and pt with improved tolerance and flexion to full range. Pt still with very limited wrist extension.   Shoulder Instructions       General Comments      Pertinent Vitals/ Pain       Pain Assessment: Faces Faces Pain Scale: Hurts a little bit Pain Location: anterior neck Pain Descriptors / Indicators: Sore Pain Intervention(s): Limited activity within patient's tolerance  Home Living                                          Prior Functioning/Environment              Frequency  Min 2X/week        Progress Toward Goals  OT Goals(current goals can now be found in the care plan section)  Progress towards OT goals: Progressing toward goals  Acute Rehab OT Goals Patient Stated Goal: to get back home OT Goal Formulation: With patient Time For Goal Achievement: 11/21/20 Potential to Achieve Goals: Good  Plan Discharge plan needs to be updated;Frequency needs to be updated    Co-evaluation                 AM-PAC OT "6 Clicks" Daily Activity     Outcome Measure   Help from another person eating meals?: A Little Help from another person taking care of personal grooming?: A Little Help from another person toileting, which includes using toliet, bedpan, or urinal?: A Little Help from another person bathing (including washing, rinsing, drying)?: A Little Help from another person to put on and taking off regular upper body clothing?: A Little Help from another person to put on and taking off regular lower body clothing?: A Little 6 Click Score: 18    End of Session    OT Visit  Diagnosis: Other abnormalities of gait and mobility (R26.89);Muscle weakness (generalized) (M62.81);Pain;Hemiplegia and hemiparesis Hemiplegia - Right/Left: Left Hemiplegia - dominant/non-dominant: Non-Dominant Hemiplegia - caused by: Unspecified Pain - Right/Left: Left Pain - part of body:  (cervical region)   Activity Tolerance Patient tolerated treatment well   Patient Left in bed;with call bell/phone within reach   Nurse Communication Mobility status        Time: 3762-8315 OT Time Calculation (min): 14 min  Charges: OT General Charges $OT Visit: 1 Visit OT Treatments $Self Care/Home Management : 8-22 mins  Gerrianne Scale, [employer], OTR/L ascom [phone] 11/13/20, 6:21 PM

## 2020-11-13 NOTE — Care Management Important Message (Signed)
Important Message  Patient Details  Name: Renee Mack MRN: [id number] Date of Birth: [birth year]   Medicare Important Message Given:  Yes     Juliann Pulse A Vivaan Helseth 11/13/2020, 11:11 AM

## 2020-11-13 NOTE — Progress Notes (Signed)
   11/11/20 0849  Assess: MEWS Score  Temp (!) 103.2 F (39.6 C)  BP (!) 148/86  Pulse Rate (!) 132  Resp 17  SpO2 (!) 86 %  O2 Device Room Air  Assess: MEWS Score  MEWS Temp 2  MEWS Systolic 0  MEWS Pulse 3  MEWS RR 0  MEWS LOC 0  MEWS Score 5  MEWS Score Color Red  Assess: if the MEWS score is Yellow or Red  Were vital signs taken at a resting state? Yes  Focused Assessment Change from prior assessment (see assessment flowsheet)  Early Detection of Sepsis Score *See Row Information* High  MEWS guidelines implemented *See Row Information* Yes  Treat  MEWS Interventions Escalated (See documentation below)  Take Vital Signs  Increase Vital Sign Frequency  Red: Q 1hr X 4 then Q 4hr X 4, if remains red, continue Q 4hrs  Escalate  MEWS: Escalate Red: discuss with charge nurse/RN and provider, consider discussing with RRT  Notify: Charge Nurse/RN  Name of Charge Nurse/RN Notified Kim, RN  Date Charge Nurse/RN Notified 11/11/20  Time Charge Nurse/RN Notified 9150  Notify: Provider  Provider Name/Title Priscella Mann, MD  Date Provider Notified 11/11/20  Time Provider Notified [phone]  Notification Type Face-to-face  Notification Reason Change in status  Provider response At bedside  Date of Provider Response 11/11/20  Time of Provider Response 0853  Notify: Rapid Response  Date Rapid Response Notified 11/11/20  Time Rapid Response Notified 0850  Inserted for Arn Medal LPN

## 2020-11-13 NOTE — Progress Notes (Signed)
PROGRESS NOTE    Renee Mack  JOI:[id number] DOB: [birth year] DOA: 11/06/2020 PCP: Olin Hauser, DO   Brief Narrative:  55 y.o. female with medical history significant of HLD, DM, CAD with DES, sCHF with EF 45-50%, vulvar CA (s/p of surgery and radiation therapy), COPD, GERD, depression with anxiety, former smoker, pancreatitis, DKA, who presents with chest pain and left arm weakness.  Patient was not found to have any active cardiac issue however she was noted to have weakness in her left arm and left lower extremity.  Underwent MRI of the cervical spine which shows significant spinal stenosis.  Subsequently seen by neurosurgery and underwent discectomy and fusion.  Over the last few days the issue has been lethargy and dysphagia.  Patient has declined inpatient rehabilitation.  She also aspirated and is currently on antibiotics for aspiration pneumonia.   Assessment & Plan:   Presumed aspiration pneumonia/Severe sepsis/Acute metabolic encephalopathy On 5/24 patient developed acute onset of fever and tachycardia, altered mentation. Chest x-ray reveals a right base infiltrate consistent with pneumonia. Patient was started on broad-spectrum antibiotics with vancomycin metronidazole and cefepime.   Fever has resolved.  Patient feels better.  Blood cultures with no growth so far. Will change over to Augmentin today. Lactic acid was mildly elevated and improved to 1.7.  Spinal stenosis of cervical region Patient's left arm weakness was likely due to cervical spine stenosis.  MRI of C spin showed degenerative disc disease C5-C7, with mass-effect on spinal cord.   Dr. Izora Ribas of neurosurgery was consulted and she is status post cervical discectomy with fusion 5/19. Pain control.  PT and OT evaluation.  Inpatient rehabilitation was recommended but patient declines.  Would prefer to go home instead.  Agreeable to home health options.  Oropharyngeal dysphagia Likely due to pharyngeal  edema from recent surgery.  This was discussed with neurosurgery.  Dexamethasone was recommended which was initiated yesterday.  Patient mentions significant improvement in her throat discomfort and swallowing ability.  Requesting that her diet be advanced.  We will do so.  Will change to twice a day dexamethasone for now and then taper over the next 3 to 4 days.  Patient has not been compliant with speech therapy recommendations.  She was educated comprehensively.  Strict aspiration precautions.  GERD (gastroesophageal reflux disease) Continue home Protonix and Pepcid  Constipation Likely due to pain medications.  Continue laxatives.  Use suppository if no bowel movement by later today.  Hyperlipidemia LDL goal <70 Continue home Lipitor  Chest pain and history of CAD: S/p DES 2019.  Her chest pain was close to the front neck, and might have been related to the spinal cord stenosis. Cardiology.  No inpatient testing was recommended.  Outpatient follow-up. Continue aspirin 81 daily Hold Brilinta postoperatively for at least 7 days from date of surgery (5/19.,  Earliest restart date would be 5/26).  We will resume Brilinta today.  Chronic systolic heart failure   2D echo on 03/25/2018 showed EF of 45-50%.   CHF seem to be compensated.  Anxiety associated with depression Continue home medications.  Avoid excessive sedative medications.  Diabetes mellitus without complication Recent O7S 96.2, poorly controlled.    Patient noted to be on 70/30 insulin and SSI.  Poorly controlled CBGs due to steroids.  Increase the dose of 70/30 insulin for now.  Hypokalemia and hypomagnesemia Potassium is normal today as is magnesium.  Continue to monitor periodically.  Hemoptysis CT angiograms negative for aortic dissection or central PE  Dilated  bile duct CT angiogram incidentally showed extrahepatic bile duct dilation.  Initial LFTs were unremarkable.  Noted to have mildly elevated AST ALT on  5/24.  Noted to be normal today.  Asymptomatic from GI standpoint.  No further work-up at this time.  Outpatient monitoring.  Vulvar cancer  s/p surgery and XRT. -pt is high dose pain med,  OxyContin 80 mg twice daily and as needed oxycodone 30 mg every 8 hours   DVT prophylaxis: SQ Lovenox Code Status: Full Family Communication: Daughter [name] [phone].  We will update daughter today. Disposition Plan: Home with home health per patient preference.  Status is: Inpatient  Remains inpatient appropriate because:Inpatient level of care appropriate due to severity of illness   Dispo: The patient is from: Home              Anticipated d/c is to: Patient declines inpatient rehabilitation and prefers to go home.              Patient currently is not medically stable to d/c.   Difficult to place patient No    Consultants:   Neurosurgery  Cardiology  Procedures:  Anterior cervical discectomy and fusion, 5/19    Subjective: Patient mentions that she is feeling really well this morning.  Throat discomfort has almost resolved.  Swallowing better.  Still has not had a good bowel movement.    Objective: Vitals:   11/12/20 1554 11/12/20 2016 11/13/20 0427 11/13/20 0717  BP: 127/73 116/61 128/72 (!) 152/78  Pulse: 85 78 65 74  Resp: 18 17 17 17   Temp: 98 F (36.7 C) 97.9 F (36.6 C) 97.7 F (36.5 C) 98.2 F (36.8 C)  TempSrc: Oral Oral    SpO2:  97% 98% 95%  Weight:      Height:        Intake/Output Summary (Last 24 hours) at 11/13/2020 0947 Last data filed at 11/12/2020 1839 Gross per 24 hour  Intake 0 ml  Output --  Net 0 ml   Filed Weights   11/06/20 1702 11/07/20 0330 11/12/20 1301  Weight: 59 kg 58 kg 60.3 kg    Examination:  General appearance: Awake alert.  In no distress Resp: Clear to auscultation bilaterally.  Normal effort Cardio: S1-S2 is normal regular.  No S3-S4.  No rubs murmurs or bruit GI: Abdomen is soft.  Nontender nondistended.  Bowel  sounds are present normal.  No masses organomegaly Extremities: No edema.  Moving all of her extremities Neurologic: Alert and oriented x3.  Left-sided weakness has improved.      Data Reviewed: I have personally reviewed following labs and imaging studies  CBC: Recent Labs  Lab 11/07/20 0520 11/09/20 1105 11/11/20 0859 11/12/20 0847 11/13/20 0405  WBC 12.7* 6.1 13.8* 10.2 8.6  NEUTROABS  --  3.3 12.6*  --   --   HGB 12.7 14.3 14.7 12.0 11.6*  HCT 37.6 44.1 45.0 36.7 34.9*  MCV 91.7 95.5 93.9 94.6 92.1  PLT 278 221 232 202 242   Basic Metabolic Panel: Recent Labs  Lab 11/07/20 0520 11/09/20 1105 11/11/20 0859 11/12/20 0847 11/13/20 0405  NA 135 138 137 139 135  K 4.1 3.5 3.6 2.9* 4.1  CL 104 106 98 105 103  CO2 20* 22 25 24 24   GLUCOSE 370* 113* 264* 105* 391*  BUN 12 10 9 10 13   CREATININE 0.51 0.46 0.63 0.55 0.59  CALCIUM 8.7* 8.7* 9.0 8.3* 8.7*  MG  --   --  1.5*  --  2.0   GFR: Estimated Creatinine Clearance: 65.5 mL/min (by C-G formula based on SCr of 0.59 mg/dL). Liver Function Tests: Recent Labs  Lab 11/11/20 0859 11/13/20 0405  AST 115* 31  ALT 67* 34  ALKPHOS 224* 177*  BILITOT 1.3* 0.6  PROT 8.2* 6.2*  ALBUMIN 3.5 2.6*   Coagulation Profile: Recent Labs  Lab 11/11/20 1040  INR 1.1   CBG: Recent Labs  Lab 11/12/20 0743 11/12/20 1136 11/12/20 1624 11/12/20 2033 11/13/20 0715  GLUCAP 101* 112* 345* 366* 370*   Sepsis Labs: Recent Labs  Lab 11/11/20 0859 11/11/20 1040 11/11/20 1226  PROCALCITON 0.16  --   --   LATICACIDVEN  --  2.0* 1.7    Recent Results (from the past 240 hour(s))  Resp Panel by RT-PCR (Flu A&B, Covid) Nasopharyngeal Swab     Status: None   Collection Time: 11/06/20  7:17 AM   Specimen: Nasopharyngeal Swab; Nasopharyngeal(NP) swabs in vial transport medium  Result Value Ref Range Status   SARS Coronavirus 2 by RT PCR NEGATIVE NEGATIVE Final    Comment: (NOTE) SARS-CoV-2 target nucleic acids are NOT  DETECTED.  The SARS-CoV-2 RNA is generally detectable in upper respiratory specimens during the acute phase of infection. The lowest concentration of SARS-CoV-2 viral copies this assay can detect is 138 copies/mL. A negative result does not preclude SARS-Cov-2 infection and should not be used as the sole basis for treatment or other patient management decisions. A negative result may occur with  improper specimen collection/handling, submission of specimen other than nasopharyngeal swab, presence of viral mutation(s) within the areas targeted by this assay, and inadequate number of viral copies(<138 copies/mL). A negative result must be combined with clinical observations, patient history, and epidemiological information. The expected result is Negative.  Fact Sheet for Patients:  EntrepreneurPulse.com.au  Fact Sheet for Healthcare Providers:  IncredibleEmployment.be  This test is no t yet approved or cleared by the [place] FDA and  has been authorized for detection and/or diagnosis of SARS-CoV-2 by FDA under an Emergency Use Authorization (EUA). This EUA will remain  in effect (meaning this test can be used) for the duration of the COVID-19 declaration under Section 564(b)(1) of the Act, 21 U.S.C.section 360bbb-3(b)(1), unless the authorization is terminated  or revoked sooner.       Influenza A by PCR NEGATIVE NEGATIVE Final   Influenza B by PCR NEGATIVE NEGATIVE Final    Comment: (NOTE) The Xpert Xpress SARS-CoV-2/FLU/RSV plus assay is intended as an aid in the diagnosis of influenza from Nasopharyngeal swab specimens and should not be used as a sole basis for treatment. Nasal washings and aspirates are unacceptable for Xpert Xpress SARS-CoV-2/FLU/RSV testing.  Fact Sheet for Patients: EntrepreneurPulse.com.au  Fact Sheet for Healthcare Providers: IncredibleEmployment.be  This test is not yet  approved or cleared by the [place] FDA and has been authorized for detection and/or diagnosis of SARS-CoV-2 by FDA under an Emergency Use Authorization (EUA). This EUA will remain in effect (meaning this test can be used) for the duration of the COVID-19 declaration under Section 564(b)(1) of the Act, 21 U.S.C. section 360bbb-3(b)(1), unless the authorization is terminated or revoked.  Performed at [facility], [address] [place], [place] [address]   Culture, blood (x 2)     Status: None (Preliminary result)   Collection Time: 11/11/20  9:13 AM   Specimen: BLOOD  Result Value Ref Range Status   Specimen Description BLOOD LEFT ANTECUBITAL  Final   Special Requests  Final    BOTTLES DRAWN AEROBIC AND ANAEROBIC Blood Culture adequate volume   Culture   Final    NO GROWTH < 24 HOURS Performed at [facility], [address] [place], [place] [address]    Report Status PENDING  Incomplete  Culture, blood (x 2)     Status: None (Preliminary result)   Collection Time: 11/11/20 10:40 AM   Specimen: BLOOD LEFT ARM  Result Value Ref Range Status   Specimen Description BLOOD LEFT ARM  Final   Special Requests   Final    BOTTLES DRAWN AEROBIC AND ANAEROBIC Blood Culture adequate volume   Culture   Final    NO GROWTH < 24 HOURS Performed at [facility], [address] [place], [place] [address]    Report Status PENDING  Incomplete  MRSA PCR Screening     Status: None   Collection Time: 11/12/20  3:00 PM   Specimen: Nasal Mucosa; Nasopharyngeal  Result Value Ref Range Status   MRSA by PCR NEGATIVE NEGATIVE Final    Comment:        The GeneXpert MRSA Assay (FDA approved for NASAL specimens only), is one component of a comprehensive MRSA colonization surveillance program. It is not intended to diagnose MRSA infection nor to guide or monitor treatment for MRSA infections. Performed at [facility], [address],  [place], [place] [address]          Radiology Studies: No results found.      Scheduled Meds: . acetaminophen  1,000 mg Oral TID  . amitriptyline  75 mg Oral QHS  . amLODipine  5 mg Oral Daily  . amoxicillin-clavulanate  1 tablet Oral Q12H  . aspirin EC  81 mg Oral Daily  . dexamethasone  4 mg Oral Q12H  . enoxaparin (LOVENOX) injection  40 mg Subcutaneous Q24H  . famotidine  20 mg Oral BID  . feeding supplement  237 mL Oral TID BM  . insulin aspart  0-5 Units Subcutaneous QHS  . insulin aspart  0-9 Units Subcutaneous TID WC  . insulin aspart protamine- aspart  15 Units Subcutaneous BID WC  . insulin aspart protamine- aspart  5 Units Subcutaneous Once  . methocarbamol  500 mg Oral TID  . mirtazapine  15 mg Oral QHS  . multivitamin with minerals  1 tablet Oral Daily  . oxyCODONE  80 mg Oral Q8H  . pantoprazole  40 mg Oral QAC breakfast  . polyethylene glycol  17 g Oral Daily  . ranolazine  500 mg Oral BID  . senna-docusate  1 tablet Oral BID  . silver sulfADIAZINE   Topical Daily  . sucralfate  1 g Oral QID  . tamsulosin  0.4 mg Oral QPC supper   Continuous Infusions: . sodium chloride 75 mL/hr at 11/12/20 1941     LOS: 7 days       Bonnielee Haff, MD [facility]   If 7PM-7AM, please contact night-coverage 11/13/2020, 9:47 AM

## 2020-11-13 NOTE — Progress Notes (Signed)
Daily Progress Note   Patient Name: Renee Mack       Date: 11/13/2020 DOB: Feb 13, 1966  Age: 55 y.o. MRN#: [id number] Attending Physician: Bonnielee Haff, MD Primary Care Physician: Olin Hauser, DO Admit Date: 11/06/2020  Reason for Consultation/Follow-up: Establishing goals of care  Subjective: Patient is sitting in bed. She is in good spirits. She states her diet was upgraded and she feels she is doing well with it. She states she really would like to just go home. She states she will have help at home and would like home health to come in and work with her. She states at this time she would like full code/full scope. She discusses her husbands intricate care needs with tubes and drains and is very well aware of hospice and what they offer. Questions answered.  Recommend palliative to follow outpatient.   Length of Stay: 7  Current Medications: Scheduled Meds:  . acetaminophen  1,000 mg Oral TID  . amitriptyline  75 mg Oral QHS  . amLODipine  5 mg Oral Daily  . amoxicillin-clavulanate  1 tablet Oral Q12H  . aspirin EC  81 mg Oral Daily  . dexamethasone  4 mg Oral Q12H  . enoxaparin (LOVENOX) injection  40 mg Subcutaneous Q24H  . famotidine  20 mg Oral BID  . feeding supplement  237 mL Oral TID BM  . insulin aspart  0-5 Units Subcutaneous QHS  . insulin aspart  0-9 Units Subcutaneous TID WC  . insulin aspart protamine- aspart  15 Units Subcutaneous BID WC  . methocarbamol  500 mg Oral TID  . mirtazapine  15 mg Oral QHS  . multivitamin with minerals  1 tablet Oral Daily  . oxyCODONE  80 mg Oral Q8H  . pantoprazole  40 mg Oral QAC breakfast  . polyethylene glycol  17 g Oral Daily  . ranolazine  500 mg Oral BID  . saccharomyces boulardii  250 mg Oral BID  .  senna-docusate  1 tablet Oral BID  . silver sulfADIAZINE   Topical Daily  . sucralfate  1 g Oral QID  . tamsulosin  0.4 mg Oral QPC supper  . ticagrelor  90 mg Oral BID    Continuous Infusions: . sodium chloride 10 mL/hr at 11/13/20 1010    PRN Meds: acetaminophen, albuterol, bisacodyl, clonazePAM, dextromethorphan-guaiFENesin,  hydrALAZINE, nitroGLYCERIN, ondansetron (ZOFRAN) IV, oxycodone  Physical Exam Pulmonary:     Effort: Pulmonary effort is normal.  Neurological:     Mental Status: She is alert.             Vital Signs: BP (!) 152/78 (BP Location: Left Arm)   Pulse 74   Temp 98.2 F (36.8 C)   Resp 17   Ht 5\' 1"  (1.549 m)   Wt 60.3 kg Comment: bed weight  SpO2 95%   BMI 25.12 kg/m  SpO2: SpO2: 95 % O2 Device: O2 Device: Room Air O2 Flow Rate: O2 Flow Rate (L/min): 2 L/min  Intake/output summary:   Intake/Output Summary (Last 24 hours) at 11/13/2020 1601 Last data filed at 11/13/2020 1355 Gross per 24 hour  Intake 120 ml  Output --  Net 120 ml   LBM: Last BM Date: 11/13/20 Baseline Weight: Weight: 58.5 kg Most recent weight: Weight: 60.3 kg (bed weight)          Patient Active Problem List   Diagnosis Date Noted  . Malnutrition of moderate degree 11/12/2020  . Spinal stenosis of cervical region 11/06/2020  . Diabetes mellitus without complication ([place]) 28/31/5176  . Hemoptysis 11/06/2020  . Dilated bile duct 11/06/2020  . Left arm weakness   . Intractable vomiting with nausea 10/10/2020  . Diabetic ketoacidosis ([place]) 03/30/2020  . DKA (diabetic ketoacidosis) ([place]) 03/30/2020  . Vulvar cancer ([place]) 07/27/2019  . Palliative care patient 09/04/2018  . Hyperglycemia 05/28/2018  . Vulvar intraepithelial neoplasia (VIN) grade 3 05/24/2018  . History of ST elevation myocardial infarction (STEMI) 02/27/2018  . CAD (coronary artery disease) 02/27/2018  . Hyperlipidemia associated with type 2 diabetes mellitus ([place]) 02/27/2018  . Anxiety associated with  depression 02/27/2018  . Chronic systolic heart failure ([place])   . Chest pain 12/26/2017  . Unstable angina ([place])   . Ischemic cardiomyopathy   . Former smoker   . Protein-calorie malnutrition, severe 07/18/2017  . DM (diabetes mellitus) ([place]) 12/28/2014  . Centrilobular emphysema ([place]) 12/28/2014  . GERD (gastroesophageal reflux disease) 12/28/2014  . History of shingles 12/28/2014  . Hyperlipidemia LDL goal <70 12/28/2014    Palliative Care Assessment & Plan   Recommendations/Plan:  Recommend outpatient palliative    Code Status:    Code Status Orders  (From admission, onward)         Start     Ordered   11/06/20 1627  Full code  Continuous        11/06/20 1626        Code Status History    Date Active Date Inactive Code Status Order ID Comments User Context   10/10/2020 1713 10/12/2020 1728 Full Code [id number]  CoxBriant Cedar, DO ED   03/30/2020 0432 04/05/2020 1933 Full Code [id number]  Awilda Bill, NP ED   07/27/2018 1238 07/28/2018 1913 DNR [id number]  Vaughan Basta, MD ED   06/13/2018 1050 06/16/2018 1805 Full Code [id number]  Nicholes Mango, MD ED   05/28/2018 1341 05/29/2018 1751 Full Code [id number]  Mayo, Pete Pelt, MD Inpatient   05/22/2018 0607 05/23/2018 1837 Full Code [id number]  Harrie Foreman, MD ED   03/24/2018 1305 03/25/2018 1821 Full Code [id number]  Hillary Bow, MD ED   02/07/2018 0630 02/09/2018 1532 Full Code [id number]  Arta Silence, MD ED   01/26/2018 0811 01/28/2018 1803 Full Code [id number]  Harrie Foreman, MD Inpatient   01/15/2018 1342 01/17/2018 1617 Full Code [id number]  Demetrios Loll, MD Inpatient  12/26/2017 1538 12/30/2017 1928 Full Code [id number]  Wellington Hampshire, MD Inpatient   12/26/2017 1537 12/26/2017 1538 Full Code [id number]  Vaughan Basta, MD Inpatient   12/19/2017 0839 12/21/2017 1940 Full Code [id number]  Wellington Hampshire, MD Inpatient   11/03/2017 1031 11/04/2017 1850 Full Code [id number]  Fritzi Mandes, MD Inpatient   09/28/2017  1044 09/29/2017 2121 Full Code [id number]  Harrie Foreman, MD ED   07/17/2017 2004 07/20/2017 1840 Full Code [id number]  Henreitta Leber, MD ED   06/12/2017 2227 06/16/2017 1910 Full Code [id number]  Gorden Harms, MD Inpatient   12/28/2014 0305 12/28/2014 1919 Full Code [id number]  Lance Coon, MD Inpatient   Advance Care Planning Activity       Prognosis:   Unable to determine  Thank you for allowing the Palliative Medicine Team to assist in the care of this patient.   Total Time 35 min Prolonged Time Billed no       Greater than 50%  of this time was spent counseling and coordinating care related to the above assessment and plan.  Asencion Gowda, NP  Please contact Palliative Medicine Team phone at [phone] for questions and concerns.

## 2020-11-14 DIAGNOSIS — M4802 Spinal stenosis, cervical region: Secondary | ICD-10-CM | POA: Diagnosis not present

## 2020-11-14 LAB — COMPREHENSIVE METABOLIC PANEL
ALT: 24 U/L (ref 0–44)
AST: 16 U/L (ref 15–41)
Albumin: 2.6 g/dL — ABNORMAL LOW (ref 3.5–5.0)
Alkaline Phosphatase: 154 U/L — ABNORMAL HIGH (ref 38–126)
Anion gap: 8 (ref 5–15)
BUN: 17 mg/dL (ref 6–20)
CO2: 28 mmol/L (ref 22–32)
Calcium: 8.5 mg/dL — ABNORMAL LOW (ref 8.9–10.3)
Chloride: 97 mmol/L — ABNORMAL LOW (ref 98–111)
Creatinine, Ser: 0.61 mg/dL (ref 0.44–1.00)
GFR, Estimated: >60 mL/min (ref >60)
Glucose, Bld: 372 mg/dL — ABNORMAL HIGH (ref 70–99)
Potassium: 3.9 mmol/L (ref 3.5–5.1)
Sodium: 133 mmol/L — ABNORMAL LOW (ref 135–145)
Total Bilirubin: 0.4 mg/dL (ref 0.3–1.2)
Total Protein: 6.3 g/dL — ABNORMAL LOW (ref 6.5–8.1)

## 2020-11-14 LAB — CBC
HCT: 36.7 % (ref 36.0–46.0)
Hemoglobin: 12.8 g/dL (ref 12.0–15.0)
MCH: 30.8 pg (ref 26.0–34.0)
MCHC: 34.9 g/dL (ref 30.0–36.0)
MCV: 88.4 fL (ref 80.0–100.0)
Platelets: 269 10*3/uL (ref 150–400)
RBC: 4.15 MIL/uL (ref 3.87–5.11)
RDW: 12.8 % (ref 11.5–15.5)
WBC: 8.3 10*3/uL (ref 4.0–10.5)
nRBC: 0 % (ref 0.0–0.2)

## 2020-11-14 LAB — GLUCOSE, CAPILLARY: Glucose-Capillary: 375 mg/dL — ABNORMAL HIGH (ref 70–99)

## 2020-11-14 MED ORDER — SACCHAROMYCES BOULARDII 250 MG PO CAPS: 250.0000 mg | ORAL_CAPSULE | Freq: Two times a day (BID) | ORAL | 0 refills | Status: AC

## 2020-11-14 MED ORDER — POLYETHYLENE GLYCOL 3350 17 G PO PACK: 17.0000 g | PACK | Freq: Every day | ORAL | 0 refills | Status: AC

## 2020-11-14 MED ORDER — DEXAMETHASONE 4 MG PO TABS: 4.0000 mg | ORAL_TABLET | Freq: Every day | ORAL | 0 refills | Status: AC

## 2020-11-14 MED ORDER — INSULIN ASPART 100 UNIT/ML IJ SOLN: 0.0000 [IU] | Freq: Every day | INTRAMUSCULAR | Status: DC

## 2020-11-14 MED ORDER — DEXAMETHASONE 4 MG PO TABS
4.0000 mg | ORAL_TABLET | Freq: Every day | ORAL | Status: DC
  Administered 2020-11-14: 4 mg via ORAL
  Filled 2020-11-14: qty 1

## 2020-11-14 MED ORDER — AMOXICILLIN-POT CLAVULANATE 875-125 MG PO TABS: 1.0000 | ORAL_TABLET | Freq: Two times a day (BID) | ORAL | 0 refills | Status: AC

## 2020-11-14 MED ORDER — INSULIN ASPART PROT & ASPART (70-30 MIX) 100 UNIT/ML ~~LOC~~ SUSP
20.0000 [IU] | Freq: Two times a day (BID) | SUBCUTANEOUS | Status: DC
  Administered 2020-11-14: 20 [IU] via SUBCUTANEOUS
  Filled 2020-11-14: qty 10

## 2020-11-14 MED ORDER — AMLODIPINE BESYLATE 5 MG PO TABS
5.0000 mg | ORAL_TABLET | Freq: Every day | ORAL | 1 refills | Status: AC
Start: 2020-11-14 — End: ?

## 2020-11-14 MED ORDER — INSULIN ASPART 100 UNIT/ML IJ SOLN
0.0000 [IU] | Freq: Three times a day (TID) | INTRAMUSCULAR | Status: DC
  Administered 2020-11-14: 20 [IU] via SUBCUTANEOUS
  Filled 2020-11-14: qty 1

## 2020-11-14 NOTE — TOC Progression Note (Signed)
Transition of Care [facility]) - Progression Note    Patient Details  Name: Renee Mack MRN: [id number] Date of Birth: 1965-12-13  Transition of [facility]) CM/SW [place], RN Phone Number: 11/14/2020, 9:41 AM  Clinical Narrative:    Spoke with the Patient about DC, she is doing so much better, she stated that she has all the help she needs at home, she has transportation, She can afford her medication, she is set up with [facility]        Expected Discharge Plan and Services           Expected Discharge Date: 11/14/20                                     Social Determinants of Health (SDOH) Interventions    Readmission Risk Interventions Readmission Risk Prevention Plan 10/11/2020 04/01/2020  Transportation Screening Complete Complete  PCP or Specialist Appt within 3-5 Days Complete Complete  HRI or Home Care Consult Complete -  Social Work Consult for [place] Planning/Counseling Complete Complete  Palliative Care Screening Complete Not Applicable  Medication Review Press photographer) Complete Complete  Some recent data might be hidden

## 2020-11-14 NOTE — Discharge Summary (Signed)
[facility]  Physician Discharge Summary   Patient ID: Renee Mack MRN: [id number] DOB/AGE: Jul 21, 1965 55 y.o.  Admit date: 11/06/2020 Discharge date: 11/14/2020    PCP: Olin Hauser, DO  DISCHARGE DIAGNOSES:  Cervical spinal stenosis status post cervical discectomy and fusion Oropharyngeal dysphagia as a result of pharyngeal edema from surgery now improved Aspiration pneumonia, improved GERD Constipation Hyperlipidemia Coronary artery disease Chronic systolic CHF History of anxiety and depression Diabetes mellitus type 2 without complication Hypokalemia and hypomagnesemia History of vulvar cancer  RECOMMENDATIONS FOR OUTPATIENT FOLLOW UP: 1. Patient to follow-up with Dr. Dixie Dials her neurosurgeon in 1 to 2 weeks 2. Follow-up with primary care provider in 2 to 3 weeks    Home Health: PT OT RN Equipment/Devices: None  CODE STATUS: Full code  DISCHARGE CONDITION: fair  Diet recommendation: Dysphagia 2 diet with thin liquids  INITIAL HISTORY: 55 y.o.femalewith medical history significant ofHLD, DM, CAD with DES, sCHF with EF 45-50%, vulvar CA (s/p ofsurgery and radiation therapy), COPD, GERD, depression with anxiety, former smoker, pancreatitis, DKA, who presents with chest pain and left arm weakness.  Patient was not found to have any active cardiac issue however she was noted to have weakness in her left arm and left lower extremity.  Underwent MRI of the cervical spine which shows significant spinal stenosis.  Subsequently seen by neurosurgery and underwent discectomy and fusion.    Consultants:   Neurosurgery  Cardiology  Procedures:  Anterior cervical discectomy and fusion, 5/19   HOSPITAL COURSE:   Spinal stenosis of cervical region Patient's left arm weakness was likely due to cervical spine stenosis. MRI of C spin showeddegenerative disc disease C5-C7, with mass-effect on spinal cord.  Dr. Izora Ribas of neurosurgery was  consulted and she is status post cervical discectomy with fusion 5/19. Pain control.  PT and OT evaluation.  Inpatient rehabilitation was recommended but patient declines.  Would prefer to go home instead.  Agreeable to home health options.  Aspiration pneumonia/Severe sepsis/Acute metabolic encephalopathy On 5/24 patient developed acute onset of fever and tachycardia, altered mentation. Chest x-ray reveals a right base infiltrate consistent with pneumonia. Patient was started on broad-spectrum antibiotics with vancomycin metronidazole and cefepime.   Fever has resolved.  Patient feels better.  Blood cultures with no growth so far. Changed over to Augmentin.  Respiratory status is stable.  Oropharyngeal dysphagia Likely due to pharyngeal edema from recent surgery.  This was discussed with neurosurgery.  Patient was started on dexamethasone subsequently.  She had rapid improvement in her discomfort as well as ability to swallow.  Seen by speech therapy as well.  Will be discharged with recommendations to do dysphagia 2 diet for now.  Dexamethasone once daily for 3 more days  GERD (gastroesophageal reflux disease) Continue home medications  Constipation Likely due to pain medications.    Continue with bowel regimen  Hyperlipidemia LDL goal <70 Continue home Lipitor  Chest painand history of CAD: S/p DES 2019. Her chest pain was close to the front neck, and might have been related to the spinal cord stenosis. She was seen by cardiology.  No inpatient testing was recommended.  Outpatient follow-up. Continue with aspirin and Brilinta  Chronic systolic heart failure   2D echo on 03/25/2018 showed EF of 45-50%.  CHF seem to be compensated.  Anxiety associated with depression Continue home medications.    Diabetes mellitus without complication Recent D7O 24.2, poorly controlled. Elevated CBGs due to steroids.  Should come down as steroid is tapered off.  Hypokalemia and  hypomagnesemia These were corrected  Hemoptysis CT angiograms negative for aortic dissection or central PE  Dilated bile duct CT angiogram incidentally showed extrahepatic bile duct dilation.  Initial LFTs were unremarkable.  Noted to have mildly elevated AST ALT on 5/24.  Noted to be normal subsequently.  Asymptomatic from GI standpoint.  No further work-up at this time.  Outpatient monitoring.  Vulvar cancer  with chronic pain s/p surgery and XRT. -pt is high dose pain med, OxyContin 80 mg twice daily and as needed oxycodone 30 mg every 8 hours  Moderate protein calorie malnutrition Nutrition Problem: Moderate Malnutrition (in the context of chronic illness) Etiology: decreased appetite  Signs/Symptoms: mild fat depletion,moderate fat depletion,mild muscle depletion,moderate muscle depletion  Interventions: Ensure Enlive (each supplement provides 350kcal and 20 grams of protein)  Patient is stable.  She is quite adamant that she wants to go home rather than go to rehab.  Home health services have been ordered.  Patient is stable for discharge today.   PERTINENT LABS:  The results of significant diagnostics from this hospitalization (including imaging, microbiology, ancillary and laboratory) are listed below for reference.    Microbiology: Recent Results (from the past 240 hour(s))  Resp Panel by RT-PCR (Flu A&B, Covid) Nasopharyngeal Swab     Status: None   Collection Time: 11/06/20  7:17 AM   Specimen: Nasopharyngeal Swab; Nasopharyngeal(NP) swabs in vial transport medium  Result Value Ref Range Status   SARS Coronavirus 2 by RT PCR NEGATIVE NEGATIVE Final    Comment: (NOTE) SARS-CoV-2 target nucleic acids are NOT DETECTED.  The SARS-CoV-2 RNA is generally detectable in upper respiratory specimens during the acute phase of infection. The lowest concentration of SARS-CoV-2 viral copies this assay can detect is 138 copies/mL. A negative result does not preclude  SARS-Cov-2 infection and should not be used as the sole basis for treatment or other patient management decisions. A negative result may occur with  improper specimen collection/handling, submission of specimen other than nasopharyngeal swab, presence of viral mutation(s) within the areas targeted by this assay, and inadequate number of viral copies(<138 copies/mL). A negative result must be combined with clinical observations, patient history, and epidemiological information. The expected result is Negative.  Fact Sheet for Patients:  EntrepreneurPulse.com.au  Fact Sheet for Healthcare Providers:  IncredibleEmployment.be  This test is no t yet approved or cleared by the [place] FDA and  has been authorized for detection and/or diagnosis of SARS-CoV-2 by FDA under an Emergency Use Authorization (EUA). This EUA will remain  in effect (meaning this test can be used) for the duration of the COVID-19 declaration under Section 564(b)(1) of the Act, 21 U.S.C.section 360bbb-3(b)(1), unless the authorization is terminated  or revoked sooner.       Influenza A by PCR NEGATIVE NEGATIVE Final   Influenza B by PCR NEGATIVE NEGATIVE Final    Comment: (NOTE) The Xpert Xpress SARS-CoV-2/FLU/RSV plus assay is intended as an aid in the diagnosis of influenza from Nasopharyngeal swab specimens and should not be used as a sole basis for treatment. Nasal washings and aspirates are unacceptable for Xpert Xpress SARS-CoV-2/FLU/RSV testing.  Fact Sheet for Patients: EntrepreneurPulse.com.au  Fact Sheet for Healthcare Providers: IncredibleEmployment.be  This test is not yet approved or cleared by the [place] FDA and has been authorized for detection and/or diagnosis of SARS-CoV-2 by FDA under an Emergency Use Authorization (EUA). This EUA will remain in effect (meaning this test can be used) for the duration of  the COVID-19 declaration under Section 564(b)(1) of the Act, 21 U.S.C. section 360bbb-3(b)(1), unless the authorization is terminated or revoked.  Performed at [facility], [address] [place], [place] [address]   Culture, blood (x 2)     Status: None (Preliminary result)   Collection Time: 11/11/20  9:13 AM   Specimen: BLOOD  Result Value Ref Range Status   Specimen Description BLOOD LEFT ANTECUBITAL  Final   Special Requests   Final    BOTTLES DRAWN AEROBIC AND ANAEROBIC Blood Culture adequate volume   Culture   Final    NO GROWTH 4 DAYS Performed at [facility], [address], [place], [place] [address]    Report Status PENDING  Incomplete  Culture, blood (x 2)     Status: None (Preliminary result)   Collection Time: 11/11/20 10:40 AM   Specimen: BLOOD LEFT ARM  Result Value Ref Range Status   Specimen Description BLOOD LEFT ARM  Final   Special Requests   Final    BOTTLES DRAWN AEROBIC AND ANAEROBIC Blood Culture adequate volume   Culture   Final    NO GROWTH 4 DAYS Performed at [facility], [address] [place], [place] [address]    Report Status PENDING  Incomplete  MRSA PCR Screening     Status: None   Collection Time: 11/12/20  3:00 PM   Specimen: Nasal Mucosa; Nasopharyngeal  Result Value Ref Range Status   MRSA by PCR NEGATIVE NEGATIVE Final    Comment:        The GeneXpert MRSA Assay (FDA approved for NASAL specimens only), is one component of a comprehensive MRSA colonization surveillance program. It is not intended to diagnose MRSA infection nor to guide or monitor treatment for MRSA infections. Performed at [facility], [address] [place], [place] [address]      Labs:  COVID-19 Labs   Lab Results  Component Value Date   [place] 11/06/2020   [place] NEGATIVE 10/10/2020   [place] NEGATIVE 03/30/2020      Basic Metabolic Panel: Recent Labs  Lab 11/09/20 1105  11/11/20 0859 11/12/20 0847 11/13/20 0405 11/14/20 0443  NA 138 137 139 135 133*  K 3.5 3.6 2.9* 4.1 3.9  CL 106 98 105 103 97*  CO2 22 25 24 24 28   GLUCOSE 113* 264* 105* 391* 372*  BUN 10 9 10 13 17   CREATININE 0.46 0.63 0.55 0.59 0.61  CALCIUM 8.7* 9.0 8.3* 8.7* 8.5*  MG  --  1.5*  --  2.0  --    Liver Function Tests: Recent Labs  Lab 11/11/20 0859 11/13/20 0405 11/14/20 0443  AST 115* 31 16  ALT 67* 34 24  ALKPHOS 224* 177* 154*  BILITOT 1.3* 0.6 0.4  PROT 8.2* 6.2* 6.3*  ALBUMIN 3.5 2.6* 2.6*   CBC: Recent Labs  Lab 11/09/20 1105 11/11/20 0859 11/12/20 0847 11/13/20 0405 11/14/20 0443  WBC 6.1 13.8* 10.2 8.6 8.3  NEUTROABS 3.3 12.6*  --   --   --   HGB 14.3 14.7 12.0 11.6* 12.8  HCT 44.1 45.0 36.7 34.9* 36.7  MCV 95.5 93.9 94.6 92.1 88.4  PLT 221 232 202 225 269   BNP: BNP (last 3 results) Recent Labs    11/06/20 0629  BNP 52.7     CBG: Recent Labs  Lab 11/13/20 1133 11/13/20 1657 11/13/20 2033 11/13/20 2325 11/14/20 0727  GLUCAP 360* 391* 403* 393* 375*     IMAGING STUDIES  DG Cervical Spine 2 or 3 views  Result Date: 11/07/2020 CLINICAL DATA:  Status post cervical fusion EXAM: CERVICAL SPINE - 2-3 VIEW COMPARISON:  Intraoperative cervical spine images Nov 06, 2020; cervical MR Nov 06, 2020 FINDINGS: Frontal and lateral views were obtained. Patient is status post anterior screw and plate fixation from Q2-V9 with disc spacers at C5-6 and C6-7. Support hardware intact. No fracture or spondylolisthesis. Air in the prevertebral soft tissues is consistent with recent surgery. Predental space region normal. Disc spaces appear unremarkable. Occasional foci of carotid artery calcification bilaterally noted. Visualized lung apices clear. IMPRESSION: Anterior screw and plate fixation from D6-L8 with disc spacers at C5-6 and C6-7. Support hardware intact. No fracture or spondylolisthesis. No evident arthropathy. Prevertebral soft tissue air consistent with  recent surgery. Scattered foci of carotid artery calcification bilaterally noted. Electronically Signed   By: Lowella Grip III M.D.   On: 11/07/2020 13:20   DG Cervical Spine 2-3 Views  Result Date: 11/06/2020 CLINICAL DATA:  Anterior cervical decompression EXAM: CERVICAL SPINE - 2-3 VIEW COMPARISON:  MRI from earlier in the same day. FLUOROSCOPY TIME:  Radiation Exposure Index (as provided by the fluoroscopic device): 0.75 mGy If the device does not provide the exposure index: Fluoroscopy Time:  5 seconds Number of Acquired Images:  2 FINDINGS: Initial images show a surgical instrument at the anterior aspect of the C5-6 interspace. Subsequently anterior fixation plate from V5-I4 is noted. IMPRESSION: C5-C7 cervical fusion. Electronically Signed   By: Inez Catalina M.D.   On: 11/06/2020 20:24   CT Head Wo Contrast  Result Date: 11/06/2020 CLINICAL DATA:  Left arm weakness EXAM: CT HEAD WITHOUT CONTRAST TECHNIQUE: Contiguous axial images were obtained from the base of the skull through the vertex without intravenous contrast. COMPARISON:  None. FINDINGS: Brain: Tiny remote infarct within the right cerebellar hemisphere. Punctate lacunar infarct versus dilated perivascular space within the inferior right basal ganglia. No abnormal intra or extra-axial mass lesion or fluid collection. No abnormal mass effect or midline shift. No evidence of acute intracranial hemorrhage or infarct. Ventricular size is normal. Cerebellum unremarkable. Vascular: Unremarkable Skull: Intact Sinuses/Orbits: Mild mucosal thickening noted within the maxillary sinuses bilaterally as well as within several ethmoid air cells bilaterally. No air-fluid levels. Remaining paranasal sinuses are clear. Orbits are unremarkable. Other: Mastoid air cells and middle ear cavities are clear. IMPRESSION: No evidence of acute intracranial hemorrhage or infarct. Tiny remote right cerebellar and possible right basal ganglia infarcts. Electronically  Signed   By: Fidela Salisbury MD   On: 11/06/2020 07:09   MR ANGIO HEAD WO CONTRAST  Result Date: 11/06/2020 CLINICAL DATA:  Neuro deficit, acute, stroke suspected. EXAM: MRA HEAD WITHOUT CONTRAST TECHNIQUE: Angiographic images of the Circle of Willis were acquired using MRA technique without intravenous contrast. COMPARISON:  No pertinent prior exam. FINDINGS: The visualized portions of the distal cervical and intracranial internal carotid arteries are widely patent with normal flow related enhancement. The bilateral anterior cerebral arteries and middle cerebral arteries are widely patent with antegrade flow without high-grade flow-limiting stenosis or proximal branch occlusion. No intracranial aneurysm within the anterior circulation. The vertebral arteries are widely patent with antegrade flow. The posterior inferior cerebral arteries are normal. Vertebrobasilar junction and basilar artery are widely patent with antegrade flow without evidence of basilar stenosis or aneurysm. Posterior cerebral arteries are normal bilaterally. No intracranial aneurysm within the posterior circulation. IMPRESSION: No evidence of intracranial large vessel occlusion or hemodynamically significant stenosis. Electronically Signed   By:  Pedro Earls M.D.   On: 11/06/2020 10:28   MR Angiogram Neck W or Wo Contrast  Result Date: 11/06/2020 CLINICAL DATA:  Neuro deficit, acute, stroke suspected. EXAM: MRA NECK WITHOUT AND WITH CONTRAST TECHNIQUE: Multiplanar and multiecho pulse sequences of the neck were obtained without and with intravenous contrast. Angiographic images of the neck were obtained using MRA technique without and with intravenous contrast. CONTRAST:  48mL GADAVIST GADOBUTROL 1 MMOL/ML IV SOLN COMPARISON:  CT angiogram of the chest Nov 06, 2020. FINDINGS: Aortic arch: Normal 3 vessel aortic branching pattern. The visualized subclavian arteries are normal. Mild luminal irregularity of the aortic arch  correlating with atherosclerotic disease seen on prior CT angiogram of the chest. No significant stenosis or aneurysm. Right carotid system: Mild atherosclerotic changes at the right carotid bifurcation without hemodynamically significant stenosis. Left carotid system: Atherosclerotic changes at of the left carotid bifurcation without hemodynamically significant stenosis. Vertebral arteries: Codominant. Vertebral artery origins are normal. Vertebral arteries are normal in course and caliber to the vertebrobasilar confluence without stenosis or evidence of dissection. IMPRESSION: 1. Mild atherosclerotic changes in the bilateral carotid bifurcation without hemodynamically significant stenosis. 2. Mild atherosclerotic changes in the aortic arch. Electronically Signed   By: Pedro Earls M.D.   On: 11/06/2020 10:40   MR BRAIN WO CONTRAST  Result Date: 11/06/2020 CLINICAL DATA:  Neuro deficit, acute, stroke suspected. EXAM: MRI HEAD WITHOUT CONTRAST TECHNIQUE: Multiplanar, multiecho pulse sequences of the brain and surrounding structures were obtained without intravenous contrast. COMPARISON:  Head CT Nov 06, 2020 FINDINGS: Brain: No acute infarction, hemorrhage, hydrocephalus, extra-axial collection or mass lesion. Vascular: Normal flow voids. Skull and upper cervical spine: Normal marrow signal. Sinuses/Orbits: Mucosal thickening throughout the paranasal sinuses, more pronounced in the ethmoid cells. The orbits are maintained. IMPRESSION: 1. Unremarkable MRI of the brain. 2. Inflammatory paranasal sinus disease. Electronically Signed   By: Pedro Earls M.D.   On: 11/06/2020 10:10   MR Cervical Spine Wo Contrast  Result Date: 11/06/2020 CLINICAL DATA:  Spinal stenosis. Cervical radiculopathy. Known malignancy. EXAM: MRI CERVICAL SPINE WITHOUT CONTRAST TECHNIQUE: Multiplanar, multisequence MR imaging of the cervical spine was performed. No intravenous contrast was administered.  COMPARISON:  MRI of the cervical spine January 26, 2018. FINDINGS: Alignment: Physiologic. Vertebrae: No fracture, evidence of discitis, or bone lesion. Cord: Mass effect on the cord at C5-6 and C6-7, progressed from prior MRI, without cord signal abnormality. Posterior Fossa, vertebral arteries, paraspinal tissues: Negative. Disc levels: C2-3: No spinal canal or neural foraminal stenosis. C3-4: Tiny posterior disc protrusion without significant spinal canal stenosis. Uncovertebral and facet degenerative changes resulting in mild left neural foraminal narrowing. C4-5: Posterior disc protrusion causing small indentation of the thecal sac without significant spinal canal stenosis. Uncovertebral and facet degenerative changes resulting in mild left neural foraminal narrowing. C5-6: Posterior disc protrusion asymmetric to the right causing mild mass effect on the anterior cord surface and resulting in mild spinal canal stenosis, mildly progressed from prior MRI. Uncovertebral and facet degenerative changes resulting moderate right and severe left neural foraminal narrowing. C6-7: Posterior disc protrusion mildly asymmetric to the left causing mass effect on the cord without cord signal abnormality and resulting in moderate to severe spinal canal stenosis, progressed from prior MRI. Uncovertebral and facet degenerative changes resulting moderate right and severe left neural foraminal narrowing. C7-T1: Facet degenerative changes. No spinal canal or neural foraminal stenosis. IMPRESSION: 1. Degenerative changes of the cervical spine, more pronounced at C6-7  where there is moderate to severe spinal canal stenosis with mass effect on the cord, progressed from prior MRI. No cord signal abnormality. 2. Mild spinal canal stenosis at C5-6, mildly progressed from prior MRI. 3. Multilevel neural foraminal narrowing with moderate right and severe left neural foraminal narrowing at C5-6 and C6-7. Electronically Signed   By: Pedro Earls M.D.   On: 11/06/2020 10:23   DG Chest Port 1 View  Result Date: 11/11/2020 CLINICAL DATA:  Aspiration.  Shortness of breath. EXAM: PORTABLE CHEST 1 VIEW COMPARISON:  10/10/2020. FINDINGS: Heart size normal. Right base infiltrate consistent with pneumonia. Aspiration could present in this fashion. Bibasilar atelectasis. No pleural effusion or pneumothorax. Barium noted the stomach. Prior cervical spine fusion. IMPRESSION: Right base infiltrate consistent with pneumonia. Aspiration could present this fashion. Bibasilar atelectasis. Electronically Signed   By: Marcello Moores  Register   On: 11/11/2020 09:09   DG Swallowing Func-Speech Pathology  Result Date: 11/12/2020 Objective Swallowing Evaluation: Type of Study: MBS-Modified Barium Swallow Study  Patient Details Name: Armine Rizzolo MRN: [id number] Date of Birth: 07-14-1965 Today's Date: 11/12/2020 Time: SLP Start Time (ACUTE ONLY): 0830 -SLP Stop Time (ACUTE ONLY): 0850 SLP Time Calculation (min) (ACUTE ONLY): 20 min Past Medical History: Past Medical History: Diagnosis Date . Cancer ([place])   vulvular . Cervical disc disease   a. 01/2018 MRI Cervical spine: Cervical spondylosis with multilevel disc and facet degeneration greatest at C5-6 and C6-7.  Moderate to sev R C5-6, mod L C5-6, and mod bilat C6-7 foraminal stenosis with multilevel mild foraminal stenosis.  Mild C5-6 and mod C6-7 canal stenosis. C6-7 central cord impingement w/ cord flattening. . Chronic combined systolic (congestive) and diastolic (congestive) heart failure ([place])   a. 12/2017 Echo: EF 30-35%, mid-apicalanteroseptal, ant, and apical AK. Gr1 DD. Mild conc LVH; b. 03/2018 Echo: EF 45-50%, antsept, ant HK. Mild MR. Nl LA size. Nl RV fxn. Marland Kitchen COPD (chronic obstructive pulmonary disease) (H[facility])   not on home oxygen . Coronary artery disease   a. 12/2017 ACS/PCI: LAD 100p (2.25x26 Resolute Onyx DES), 69m (2.0x12 Resolute Onyx DES), 80d (2.0x15 Resolute Onyx DES), RCA 90p (non-dominant). EF  25-35%. Post-MI course complicated by CGS; b. 0/2725 NSTEMI/subacute thrombosis-->LAD 100 (PTCA + DES x 1); c. 01/2018 NSTEMI/PCI: LM min irregs, LAD 50-60p ISR/hazy (3.5x12 Resolute DES), 41m/d (underexpansion of prior stents-->PTCA). EF 45-50%. Marland Kitchen GERD (gastroesophageal reflux disease)  . H/O degenerative disc disease  . Hypotension  . Ischemic cardiomyopathy   a. 12/2017 Echo: EF 30-35%; b. 03/2018 Echo: EF 45-50%. . Myocardial infarction ([place])   a. 12/2017-->DES to LAD x 3. . Pancreatitis  . Shingles  . Spinal stenosis  . Tobacco abuse  . Ulcer (traumatic) of oral mucosa  . Urinary incontinence  . VIN III (vulvar intraepithelial neoplasia III)  Past Surgical History: Past Surgical History: Procedure Laterality Date . ABDOMINAL HYSTERECTOMY    partial . ANTERIOR CERVICAL DECOMP/DISCECTOMY FUSION N/A 11/06/2020  Procedure: ANTERIOR CERVICAL DECOMPRESSION/DISCECTOMY FUSION 2 LEVELS C5-C7;  Surgeon: Meade Maw, MD;  Location: [facility];  Service: Neurosurgery;  Laterality: N/A; . CORONARY BALLOON ANGIOPLASTY N/A 05/22/2018  Procedure: CORONARY BALLOON ANGIOPLASTY;  Surgeon: Wellington Hampshire, MD;  Location: [facility];  Service: Cardiovascular;  Laterality: N/A; . CORONARY STENT INTERVENTION N/A 12/26/2017  Procedure: CORONARY STENT INTERVENTION;  Surgeon: Wellington Hampshire, MD;  Location: [facility];  Service: Cardiovascular;  Laterality: N/A; . CORONARY STENT INTERVENTION N/A 02/08/2018  Procedure: CORONARY STENT INTERVENTION;  Surgeon: End,  Harrell Gave, MD;  Location: [facility];  Service: Cardiovascular;  Laterality: N/A; . CORONARY/GRAFT ACUTE MI REVASCULARIZATION N/A 12/19/2017  Procedure: Coronary/Graft Acute MI Revascularization;  Surgeon: Wellington Hampshire, MD;  Location: [facility];  Service: Cardiovascular;  Laterality: N/A; . ECTOPIC PREGNANCY SURGERY Left  . INTRAVASCULAR ULTRASOUND/IVUS N/A 02/08/2018  Procedure: Intravascular Ultrasound/IVUS;  Surgeon: Nelva Bush, MD;  Location: [facility];  Service: Cardiovascular;  Laterality: N/A; . LEFT HEART CATH AND CORONARY ANGIOGRAPHY N/A 12/19/2017  Procedure: LEFT HEART CATH AND CORONARY ANGIOGRAPHY;  Surgeon: Wellington Hampshire, MD;  Location: [facility];  Service: Cardiovascular;  Laterality: N/A; . LEFT HEART CATH AND CORONARY ANGIOGRAPHY N/A 12/26/2017  Procedure: LEFT HEART CATH AND CORONARY ANGIOGRAPHY;  Surgeon: Wellington Hampshire, MD;  Location: [facility];  Service: Cardiovascular;  Laterality: N/A; . LEFT HEART CATH AND CORONARY ANGIOGRAPHY N/A 02/08/2018  Procedure: LEFT HEART CATH AND CORONARY ANGIOGRAPHY;  Surgeon: Nelva Bush, MD;  Location: [facility];  Service: Cardiovascular;  Laterality: N/A; . LEFT HEART CATH AND CORONARY ANGIOGRAPHY N/A 05/22/2018  Procedure: LEFT HEART CATH AND CORONARY ANGIOGRAPHY;  Surgeon: Wellington Hampshire, MD;  Location: [facility];  Service: Cardiovascular;  Laterality: N/A; . LEFT HEART CATH AND CORONARY ANGIOGRAPHY N/A 06/15/2018  Procedure: LEFT HEART CATH AND CORONARY ANGIOGRAPHY;  Surgeon: Wellington Hampshire, MD;  Location: [facility];  Service: Cardiovascular;  Laterality: N/A; . LYMPHADENECTOMY   . SPLENECTOMY, PARTIAL   . vulvulasectomy   HPI: Presented to ER secondary to chest pain, L UE weakness/numbness; admitted for management of spinal stenosis of cervical region, s/p emergent C5-7 ACDF (11/06/20). Of note, currently undergoing radiation therapy for vulvular cancer.  Subjective: "I feel more confused" Assessment / Plan / Recommendation CHL IP CLINICAL IMPRESSIONS 11/12/2020 Clinical Impression -- SLP Visit Diagnosis Dysphagia, oropharyngeal phase (R13.12) Attention and concentration deficit following -- Frontal lobe and executive function deficit following -- Impact on safety and function --   CHL IP TREATMENT RECOMMENDATION 11/11/2020 Treatment Recommendations Therapy as outlined in treatment plan below    Prognosis 11/11/2020 Prognosis for Safe Diet Advancement Fair Barriers to Reach Goals Cognitive deficits Barriers/Prognosis Comment -- CHL IP DIET RECOMMENDATION 11/12/2020 SLP Diet Recommendations -- Liquid Administration via -- Medication Administration -- Compensations Minimize environmental distractions;Slow rate;Small sips/bites Postural Changes --   CHL IP OTHER RECOMMENDATIONS 11/11/2020 Recommended Consults -- Oral Care Recommendations Oral care QID;Oral care before and after PO Other Recommendations --   CHL IP FOLLOW UP RECOMMENDATIONS 11/12/2020 Follow up Recommendations (No Data)   CHL IP FREQUENCY AND DURATION 11/11/2020 Speech Therapy Frequency (ACUTE ONLY) min 2x/week Treatment Duration 2 weeks      CHL IP ORAL PHASE 11/11/2020 Oral Phase Impaired Oral - Pudding Teaspoon -- Oral - Pudding Cup -- Oral - Honey Teaspoon -- Oral - Honey Cup -- Oral - Nectar Teaspoon Weak lingual manipulation;Reduced posterior propulsion;Holding of bolus;Piecemeal swallowing;Delayed oral transit;Decreased bolus cohesion;Premature spillage Oral - Nectar Cup Weak lingual manipulation;Reduced posterior propulsion;Holding of bolus;Piecemeal swallowing;Delayed oral transit;Decreased bolus cohesion;Premature spillage Oral - Nectar Straw -- Oral - Thin Teaspoon Weak lingual manipulation;Reduced posterior propulsion;Holding of bolus;Piecemeal swallowing;Delayed oral transit;Decreased bolus cohesion;Premature spillage Oral - Thin Cup -- Oral - Thin Straw -- Oral - Puree Weak lingual manipulation;Reduced posterior propulsion;Holding of bolus;Piecemeal swallowing;Delayed oral transit;Decreased bolus cohesion;Premature spillage Oral - Mech Soft -- Oral - Regular -- Oral - Multi-Consistency -- Oral - Pill -- Oral Phase - Comment --  CHL IP PHARYNGEAL PHASE 11/11/2020 Pharyngeal Phase Impaired Pharyngeal- Pudding Teaspoon -- Pharyngeal -- Pharyngeal- Pudding Cup -- Pharyngeal -- Pharyngeal- Honey Teaspoon -- Pharyngeal -- Pharyngeal- Honey Cup  -- Pharyngeal -- Pharyngeal- Nectar Teaspoon Delayed swallow initiation-pyriform sinuses;Reduced pharyngeal peristalsis;Reduced epiglottic inversion;Reduced airway/laryngeal closure;Penetration/Aspiration during swallow;Pharyngeal residue - valleculae;Compensatory strategies attempted (with notebox) Pharyngeal Material enters airway, CONTACTS cords and not ejected out Pharyngeal- Nectar Cup Delayed swallow initiation-pyriform sinuses;Reduced pharyngeal peristalsis;Reduced epiglottic inversion;Reduced airway/laryngeal closure;Penetration/Aspiration during swallow;Pharyngeal residue - valleculae Pharyngeal Material enters airway, CONTACTS cords and not ejected out Pharyngeal- Nectar Straw -- Pharyngeal -- Pharyngeal- Thin Teaspoon Delayed swallow initiation-pyriform sinuses;Reduced pharyngeal peristalsis;Reduced epiglottic inversion;Reduced airway/laryngeal closure;Penetration/Aspiration during swallow;Pharyngeal residue - valleculae Pharyngeal Material enters airway, CONTACTS cords and not ejected out Pharyngeal- Thin Cup -- Pharyngeal -- Pharyngeal- Thin Straw -- Pharyngeal -- Pharyngeal- Puree Delayed swallow initiation-vallecula;Reduced pharyngeal peristalsis;Reduced epiglottic inversion;Pharyngeal residue - valleculae Pharyngeal -- Pharyngeal- Mechanical Soft -- Pharyngeal -- Pharyngeal- Regular -- Pharyngeal -- Pharyngeal- Multi-consistency -- Pharyngeal -- Pharyngeal- Pill -- Pharyngeal -- Pharyngeal Comment --  CHL IP CERVICAL ESOPHAGEAL PHASE 11/11/2020 Cervical Esophageal Phase WFL Pudding Teaspoon -- Pudding Cup -- Honey Teaspoon -- Honey Cup -- Nectar Teaspoon -- Nectar Cup -- Nectar Straw -- Thin Teaspoon -- Thin Cup -- Thin Straw -- Puree -- Mechanical Soft -- Regular -- Multi-consistency -- Pill -- Cervical Esophageal Comment -- Happi Overton 11/12/2020, 9:48 AM              DG C-Arm 1-60 Min-No Report  Result Date: 11/06/2020 Fluoroscopy was utilized by the requesting physician.  No radiographic  interpretation.   ECHOCARDIOGRAM COMPLETE  Result Date: 11/07/2020    ECHOCARDIOGRAM REPORT   Patient Name:   ARELIA VOLPE Date of Exam: 11/07/2020 Medical Rec #:  [id number]     Height:       61.0 in Accession #:    [id number]    Weight:       127.8 lb Date of Birth:  Dec 06, 1965      BSA:          1.561 m Patient Age:    49 years      BP:           121/68 mmHg Patient Gender: F             HR:           91 bpm. Exam Location:  [facility] Procedure: 2D Echo, Cardiac Doppler and Color Doppler Indications:     R07.9 Chest pain  History:         Patient has prior history of Echocardiogram examinations, most                  recent 03/25/2018. Risk Factors:Former Smoker. Myocardial                  infarction. Coronary artery disease. COPD. Congestive heart                  failure.  Sonographer:     Wilford Sports Rodgers-Jones Referring Phys:  [place] Diagnosing Phys: Ida Rogue MD IMPRESSIONS  1. Left ventricular ejection fraction, by estimation, is 55 %. The left ventricle has normal function. The left ventricle has no regional wall motion abnormalities. Left ventricular diastolic parameters are consistent with Grade I diastolic dysfunction (impaired relaxation).  2. Right ventricular systolic function is normal. The right ventricular size is normal. Tricuspid regurgitation signal is inadequate for assessing PA pressure. FINDINGS  Left Ventricle: Left ventricular ejection  fraction, by estimation, is 55 %. The left ventricle has normal function. The left ventricle has no regional wall motion abnormalities. The left ventricular internal cavity size was normal in size. There is no left ventricular hypertrophy. Left ventricular diastolic parameters are consistent with Grade I diastolic dysfunction (impaired relaxation). Right Ventricle: The right ventricular size is normal. No increase in right ventricular wall thickness. Right ventricular systolic function is normal. Tricuspid regurgitation signal is  inadequate for assessing PA pressure. Left Atrium: Left atrial size was normal in size. Right Atrium: Right atrial size was normal in size. Pericardium: There is no evidence of pericardial effusion. Mitral Valve: The mitral valve is normal in structure. No evidence of mitral valve regurgitation. No evidence of mitral valve stenosis. Tricuspid Valve: The tricuspid valve is normal in structure. Tricuspid valve regurgitation is not demonstrated. No evidence of tricuspid stenosis. Aortic Valve: The aortic valve is normal in structure. Aortic valve regurgitation is not visualized. No aortic stenosis is present. Pulmonic Valve: The pulmonic valve was normal in structure. Pulmonic valve regurgitation is not visualized. No evidence of pulmonic stenosis. Aorta: The aortic root is normal in size and structure. Venous: The inferior vena cava is normal in size with greater than 50% respiratory variability, suggesting right atrial pressure of 3 mmHg. IAS/Shunts: No atrial level shunt detected by color flow Doppler.  LEFT VENTRICLE PLAX 2D LVIDd:         4.45 cm  Diastology LVIDs:         2.73 cm  LV e' medial:    7.18 cm/s LV PW:         0.88 cm  LV E/e' medial:  13.9 LV IVS:        0.87 cm  LV e' lateral:   8.81 cm/s LVOT diam:     1.80 cm  LV E/e' lateral: 11.4 LV SV:         59 LV SV Index:   38 LVOT Area:     2.54 cm  RIGHT VENTRICLE             IVC RV Basal diam:  2.76 cm     IVC diam: 1.61 cm RV S prime:     19.00 cm/s TAPSE (M-mode): 2.1 cm LEFT ATRIUM             Index       RIGHT ATRIUM          Index LA diam:        4.00 cm 2.56 cm/m  RA Area:     7.25 cm LA Vol (A2C):   29.5 ml 18.89 ml/m RA Volume:   13.00 ml 8.33 ml/m LA Vol (A4C):   23.7 ml 15.18 ml/m LA Biplane Vol: 27.7 ml 17.74 ml/m  AORTIC VALVE LVOT Vmax:   108.00 cm/s LVOT Vmean:  75.700 cm/s LVOT VTI:    0.231 m  AORTA Ao Root diam: 3.20 cm MITRAL VALVE MV Area (PHT): 3.72 cm     SHUNTS MV Decel Time: 204 msec     Systemic VTI:  0.23 m MV E velocity:  100.00 cm/s  Systemic Diam: 1.80 cm MV A velocity: 114.00 cm/s MV E/A ratio:  0.88 Ida Rogue MD Electronically signed by Ida Rogue MD Signature Date/Time: 11/07/2020/3:22:53 PM    Final    CT Angio Chest/Abd/Pel for Dissection W and/or W/WO  Result Date: 11/06/2020 CLINICAL DATA:  Chest pain, left arm weakness EXAM: CT ANGIOGRAPHY CHEST, ABDOMEN AND PELVIS TECHNIQUE: Non-contrast CT of  the chest was initially obtained. Multidetector CT imaging through the chest, abdomen and pelvis was performed using the standard protocol during bolus administration of intravenous contrast. Multiplanar reconstructed images and MIPs were obtained and reviewed to evaluate the vascular anatomy. CONTRAST:  62mL OMNIPAQUE IOHEXOL 350 MG/ML SOLN COMPARISON:  03/30/2020 FINDINGS: CTA CHEST FINDINGS Cardiovascular: The thoracic aorta is normal in caliber. No evidence of intramural hematoma, dissection, or aneurysm. There is moderate largely atheromatous plaque predominantly within the descending thoracic aorta demonstrating a corrugated morphology. The arch vasculature demonstrates normal anatomic configuration and is widely patent proximally. Mild coronary artery calcification. Long segment left anterior descending coronary artery stenting has been performed. Global cardiac size is within normal limits. No pericardial effusion. The central pulmonary arteries are of normal caliber. Mediastinum/Nodes: The visualized thyroid is unremarkable. No pathologic thoracic adenopathy. The esophagus is unremarkable. Lungs/Pleura: Moderate centrilobular emphysema. No focal pulmonary nodules or infiltrates. Previously noted pulmonary infiltrates have resolved, in keeping with acute infection or inflammation on the prior examination. No pneumothorax or pleural effusion. Central airways are widely patent. There is mild central bronchial wall thickening again noted in keeping with chronic airway inflammation. Musculoskeletal: No acute bone  abnormality. No lytic or blastic bone lesions identified. Review of the MIP images confirms the above findings. CTA ABDOMEN AND PELVIS FINDINGS VASCULAR Aorta: Normal caliber. No evidence of aneurysm or dissection. Extensive atheromatous plaque identified with moderate fissuring resulting in a corrugated pattern. No periaortic inflammatory change identified. Celiac: Less than 50% stenosis of the origin. Normal anatomic configuration. Distally widely patent. No evidence of aneurysm or dissection. SMA: Less than 50% stenosis at its origin. Distally widely patent. No aneurysm or dissection. Renals: Single renal arteries bilaterally. 50% stenosis of the right renal artery at its origin secondary to atherosclerotic plaque. Less than 50% stenosis of the left renal artery at its origin. Normal vascular morphology. No aneurysm. IMA: 50% stenosis at the origin.  Distally widely patent. Inflow: Moderate mixed atherosclerotic plaque within the right common iliac artery without evidence of hemodynamically significant stenosis. Left common iliac artery is widely patent. No evidence of aneurysm or dissection. Internal iliac arteries are patent at their origins bilaterally. Veins: No obvious venous abnormality within the limitations of this arterial phase study. Review of the MIP images confirms the above findings. NON-VASCULAR Hepatobiliary: The liver is unremarkable. The gallbladder is unremarkable. Similar to that noted on prior CT examination of 10/10/2020, the extrahepatic bile duct is dilated measuring up to 1 cm in greatest dimension, new since remote prior examination of 03/30/2020. As noted previously, a distal obstructing lesion is not clearly identified on this examination. Pancreas: Unremarkable Spleen: Unremarkable Adrenals/Urinary Tract: Adrenal glands are unremarkable. Kidneys are normal, without renal calculi, focal lesion, or hydronephrosis. Bladder is unremarkable. Stomach/Bowel: There is moderate stool  throughout the colon. The stomach, small bowel, and large bowel are otherwise unremarkable. Appendix normal. No free intraperitoneal gas or fluid. Lymphatic: No pathologic adenopathy within the abdomen and pelvis. Reproductive: There is extensive soft tissue thickening at the introitus and involving the medial aspects of the vulva bilaterally, similar to prior examination. Inflammatory changes extend posteriorly to involve the anus. There is asymmetric thickening of the right wall of the rectum, best appreciated on axial image # 195/5, more prominent than that seen on prior examination. Other: Tiny fat containing umbilical hernia noted. Musculoskeletal: No acute bone abnormality. No lytic or blastic bone lesions. Review of the MIP images confirms the above findings. IMPRESSION: No evidence of a thoracoabdominal aortic aneurysm  or dissection. Moderate largely atheromatous plaque demonstrating extensive fissuring within the a descending thoracic and abdominal aorta. Clinical correlation for evidence of embolization (blue toe syndrome, livedo reticularis) may be helpful for further management. 50% stenosis of the right renal artery at its origin. 50% stenosis of the inferior mesenteric artery at its origin. The wide patency of the celiac axis and superior mesenteric artery however, would not support the diagnosis of chronic mesenteric ischemia. Mild coronary artery calcification with long segment stenting of the left anterior descending coronary artery. Moderate centrilobular emphysema. Persistent dilation of the extrahepatic bile duct without obvious cause identified. Correlation with liver enzymes would be helpful to assess for an obstructing process. If indicated, ERCP or MRCP examination may be more helpful for further evaluation. Moderate stool throughout the colon. Asymmetric thickening of the a distal rectum may result in some degree of obstruction and resultant fecal retention. Soft tissue thickening appears  confluent with that seen within the introitus and perineum noted on multiple prior examinations. Aortic Atherosclerosis (ICD10-I70.0) and Emphysema (ICD10-J43.9). Electronically Signed   By: Fidela Salisbury MD   On: 11/06/2020 07:38    DISCHARGE EXAMINATION: Vitals:   11/13/20 0717 11/13/20 2108 11/14/20 0534 11/14/20 0834  BP: (!) 152/78 126/60 133/70 111/69  Pulse: 74 75 72 77  Resp: 17 18 18 16   Temp: 98.2 F (36.8 C) 99 F (37.2 C) 98 F (36.7 C) 97.7 F (36.5 C)  TempSrc:      SpO2: 95% 96% 95% 98%  Weight:      Height:       General appearance: Awake alert.  In no distress Resp: Clear to auscultation bilaterally.  Normal effort Cardio: S1-S2 is normal regular.  No S3-S4.  No rubs murmurs or bruit GI: Abdomen is soft.  Nontender nondistended.  Bowel sounds are present normal.  No masses organomegaly     DISPOSITION: Home with home health  Discharge Instructions    Call MD for:  difficulty breathing, headache or visual disturbances   Complete by: As directed    Call MD for:  extreme fatigue   Complete by: As directed    Call MD for:  persistant dizziness or light-headedness   Complete by: As directed    Call MD for:  persistant nausea and vomiting   Complete by: As directed    Call MD for:  redness, tenderness, or signs of infection (pain, swelling, redness, odor or green/yellow discharge around incision site)   Complete by: As directed    Call MD for:  severe uncontrolled pain   Complete by: As directed    Call MD for:  temperature >100.4   Complete by: As directed    Discharge instructions   Complete by: As directed    Please take your medications as prescribed.  Please be sure to follow-up with the neurosurgeon in 1 week.  Follow-up with your primary care provider in 2 to 3 weeks.  Seek attention immediately if your swallowing difficulty recurs or if he develops new symptoms such as fever chills nausea vomiting.  You were cared for by a hospitalist during your  hospital stay. If you have any questions about your discharge medications or the care you received while you were in the hospital after you are discharged, you can call the unit and asked to speak with the hospitalist on call if the hospitalist that took care of you is not available. Once you are discharged, your primary care physician will handle any further medical issues. Please  note that NO REFILLS for any discharge medications will be authorized once you are discharged, as it is imperative that you return to your primary care physician (or establish a relationship with a primary care physician if you do not have one) for your aftercare needs so that they can reassess your need for medications and monitor your lab values. If you do not have a primary care physician, you can call [phone] for a physician referral.   Increase activity slowly   Complete by: As directed    No wound care   Complete by: As directed         Allergies as of 11/14/2020      Reactions   Bee Venom Itching, Shortness Of Breath, Swelling   Metformin And Related Shortness Of Breath, Swelling   Darvon [propoxyphene] Itching   Gabapentin Swelling   Nsaids Other (See Comments)   Ulcers   Tramadol Hives   Contrast Media [iodinated Diagnostic Agents] Rash   If you benadryl, and steroids she is able to take the contrast per pt      Medication List    TAKE these medications   amitriptyline 25 MG tablet Commonly known as: ELAVIL Take 1 tablet (25 mg total) by mouth at bedtime. What changed: how much to take   amLODipine 5 MG tablet Commonly known as: NORVASC Take 1 tablet (5 mg total) by mouth daily.   amoxicillin-clavulanate 875-125 MG tablet Commonly known as: AUGMENTIN Take 1 tablet by mouth every 12 (twelve) hours for 4 days.   aspirin 81 MG tablet Take 81 mg by mouth daily.   atorvastatin 80 MG tablet Commonly known as: LIPITOR Take 1 tablet (80 mg total) by mouth daily at 6 PM.   clonazePAM 1 MG  tablet Commonly known as: KLONOPIN Take 1 mg by mouth 3 (three) times daily as needed.   dexamethasone 4 MG tablet Commonly known as: DECADRON Take 1 tablet (4 mg total) by mouth daily for 3 days.   esomeprazole 40 MG capsule Commonly known as: NexIUM Take 1 capsule (40 mg total) by mouth daily before breakfast.   famotidine 20 MG tablet Commonly known as: PEPCID Take 1 tablet (20 mg total) by mouth 2 (two) times daily.   GlucoCom Blood Glucose Monitor Devi 1 m.   Lidocaine HCl 4 % Gel Apply topically.   methocarbamol 500 MG tablet Commonly known as: ROBAXIN Take 1 tablet by mouth 4 (four) times daily as needed.   mirtazapine 15 MG tablet Commonly known as: REMERON Take 15 mg by mouth at bedtime.   nitroGLYCERIN 0.4 MG SL tablet Commonly known as: NITROSTAT Place 1 tablet (0.4 mg total) under the tongue every 5 (five) minutes as needed for chest pain.   NovoLIN 70/30 FlexPen (70-30) 100 UNIT/ML KwikPen Generic drug: insulin isophane & regular human Inject 18 Units into the skin 2 (two) times daily.   ondansetron 4 MG disintegrating tablet Commonly known as: ZOFRAN-ODT Take 4 mg by mouth every 8 (eight) hours as needed.   oxyCODONE 80 mg 12 hr tablet Commonly known as: OXYCONTIN Take 1 tablet by mouth every 8 (eight) hours.   oxycodone 30 MG immediate release tablet Commonly known as: ROXICODONE Take 30 mg by mouth every 3 (three) hours as needed.   polyethylene glycol 17 g packet Commonly known as: MIRALAX / GLYCOLAX Take 17 g by mouth daily.   ProAir HFA 108 (90 Base) MCG/ACT inhaler Generic drug: albuterol Inhale 1 puff into the lungs every 6 (six)  hours as needed for wheezing or shortness of breath.   ranolazine 500 MG 12 hr tablet Commonly known as: RANEXA Take 1 tablet (500 mg total) by mouth 2 (two) times daily.   saccharomyces boulardii 250 MG capsule Commonly known as: FLORASTOR Take 1 capsule (250 mg total) by mouth 2 (two) times daily for 10  days.   Simple Diagnostics Lancing Dev Misc AS DIRECTED   SSD 1 % cream Generic drug: silver sulfADIAZINE SMARTSIG:1 Topical 4-5 Times Daily   sucralfate 1 g tablet Commonly known as: CARAFATE Take 1 g by mouth 4 (four) times daily.   ticagrelor 90 MG Tabs tablet Commonly known as: BRILINTA Take 1 tablet (90 mg total) by mouth 2 (two) times daily. *NEEDS OFFICE VISIT FOR FURTHER REFILLS-PLEASE CALL [phone] TO SCHEDULE.*         Follow-up Information    Meade Maw, MD. Schedule an appointment as soon as possible for a visit in 1 week(s).   Specialty: Neurosurgery Why: 6/30 at 3:45pm. The office will call you directly if Dr. Izora Ribas advises to come before that date/time.   Contact information: [place] [address] [phone]        Olin Hauser, DO. Schedule an appointment as soon as possible for a visit in 2 week(s).   Specialty: Family Medicine Why: 6/7 at 2:20pm  Contact information: [place] [address] [phone]               TOTAL DISCHARGE TIME: 71 minutes  [place]  [facility] Pager on [email or web address]  11/15/2020, 10:50 AM

## 2020-11-14 NOTE — Discharge Instructions (Signed)
Dysphagia Eating Plan, Minced and Moist Foods This eating plan is for people with moderate swallowing and chewing problems who are transitioning from pureed to solid foods. Moist and minced foods are soft and cut into very small chunks so that they can be swallowed safely. On this eating plan, you may be instructed to drink liquids that are thickened. Work with your health care provider and your diet and nutrition specialist (dietitian) to make sure that you are following the eating plan safely and getting all the nutrients you need. What are tips for following this plan? General information  You may eat foods that are soft and moist.  Always test food texture before taking a bite. Poke food with a fork or spoon to make sure it is tender. Talk with your health care provider about specific ways to test the size, texture, and safety of foods.  Take small bites. Each bite should be smaller than the fingernail of your little finger (about 4 mm by 4 mm).  If you were on a pureed food eating plan, you may still eat any of the foods included in that diet.  Avoid foods that are dry, hard, sticky, chewy, coarse, or crunchy.  Avoid foods that separate into thin liquids and solids, such as cereal with milk or chunky soups. If you eat cold cereal, let it soften in milk and then drain excess milk before eating.  Avoid liquids that have seeds or chunks.  If instructed by your health care provider, thicken liquids. Follow your health care provider's instructions about what products to use, how to do this, and to what thickness. ? You may use a commercial thickener, rice cereal, or potato flakes. ? Thickened liquids are usually a "pudding-like" consistency, or they may be as thick as honey or thick enough to eat with a spoon.   Cooking  You may need to use a blender, whisk, or masher to soften some of your foods.  To moisten foods, you may add liquids while you are blending, mashing, or grinding your  foods to the right consistency. These liquids include gravies, sauces, vegetable or fruit juice, milk, half and half, or water.  Reheat foods slowly to prevent a tough crust from forming. Meal planning  Eat a variety of foods in order to get all the nutrients you need.  Follow your meal plan as told by your health care provider or dietitian. What foods can I eat? Grains Soaked soft breads without nuts or seeds. Pancakes, sweet rolls, pastries, and [place] toast that have been moistened with syrup or sauce. Well-cooked pasta, noodles, rice, and bread dressing in very small pieces and thick sauce. Soft dumplings or spaetzle in very small pieces and butter or gravy. Soft-cooked cereals. Cold cereal that has been softened in milk and the excess milk drained. Fruits Canned or cooked fruits that are soft or moist and do not have skin or seeds. Fresh, soft bananas. Thickened fruit juices. Vegetables Very soft, well-cooked vegetables in very small pieces. Soft-cooked, mashed potatoes. Thickened vegetable juice. Meats and other proteins Tender, moist, and finely minced or ground meats or poultry. Moist meatballs or meatloaf. Fish without bones. Scrambled, poached, or soft-cooked eggs. Tofu. Tempeh and meat alternatives in very small pieces. Well-cooked, moistened and mashed beans, baked beans, peas, and other legumes. Dairy Thickened milk. Cream cheese. Yogurt. Cottage cheese. Sour cream. Fats and oils Butter. Margarine. Cream for cereal, depending on liquid consistency allowed. Gravy. Cream sauces. Mayonnaise. Sweets and desserts Pudding. Custard. Ice cream  and sherbet. Whipped toppings. Soft, moist cakes. Icing. Jelly. Jams and preserves without seeds. Seasonings and other foods Sauces and salsas that have soft chunks that are smaller than 4 mm. Salad dressings. Casseroles with small pieces of tender meat. All seasonings and sweeteners. Beverages Anything prepared at the thickness recommended by  your dietitian. The items listed above may not be a complete list of foods and beverages you can eat. Contact a dietitian for more information. What foods must I avoid? Grains Breads that are hard or have nuts or seeds. Dry biscuits, pancakes, waffles, and bread dressing. Coarse cereals. Cereals that have nuts, seeds, dried fruits, or coconut. Sticky rice. Large pieces of pasta. Fruits Hard, crunchy, stringy, high-pulp, and juicy raw fruits such as apples, pineapple, papaya, and watermelon. Fruits with skins and seeds, such as grapes. Dried fruit and fruit leather. Vegetables All raw vegetables. Tough, fibrous, chewy, or stringy cooked vegetables, such as celery, peas, broccoli, cabbage, Brussels sprouts, and asparagus. Potato skins. Potato and other vegetable chips. Fried or French-fried potatoes. Cooked corn and peas. Meats and other proteins Large pieces of meat. Dry, tough meats, such as bacon, sausage, and hot dogs. Chicken, [place], or fish with skin and bones. Crunchy peanut butter. Nuts. Seeds. Dairy Yogurt with nuts, seeds, or large chunks. Large chunks of cheese. Sweets and desserts Coarse, hard, chewy, or sticky desserts. Any dessert with nuts, seeds, coconut, pineapple, or dried fruit. Bread pudding. Ask your health care provider whether you can have frozen desserts. Seasonings and other foods Soups and casseroles with large chunks. Sandwiches. Pizza. The items listed above may not be a complete list of foods and beverages you should avoid. Contact a dietitian for more information. Summary  Moist and minced foods can be helpful for people with moderate swallowing and chewing problems.  On this dysphagia eating plan, you may eat foods that are soft, moist, and cut into pieces smaller than 4 mm by 4 mm.  You may be instructed to thicken liquids. Follow your health care provider's instructions about how to do this and to what consistency. This information is not intended to replace  advice given to you by your health care provider. Make sure you discuss any questions you have with your health care provider. Document Revised: 02/25/2020 Document Reviewed: 02/25/2020 Elsevier Patient Education  2021 [employer].

## 2020-11-14 NOTE — Plan of Care (Signed)
Problem: Education: Goal: Ability to verbalize activity precautions or restrictions will improve 11/14/2020 0830 by Conard Novak, RN Outcome: Adequate for Discharge 11/14/2020 [phone] by Conard Novak, RN Outcome: Adequate for Discharge Goal: Knowledge of the prescribed therapeutic regimen will improve 11/14/2020 0830 by Conard Novak, RN Outcome: Adequate for Discharge 11/14/2020 0829 by Conard Novak, RN Outcome: Adequate for Discharge Goal: Understanding of discharge needs will improve 11/14/2020 0830 by Conard Novak, RN Outcome: Adequate for Discharge 11/14/2020 0829 by Conard Novak, RN Outcome: Adequate for Discharge   Problem: Activity: Goal: Ability to avoid complications of mobility impairment will improve 11/14/2020 0830 by Conard Novak, RN Outcome: Adequate for Discharge 11/14/2020 [phone] by Conard Novak, RN Outcome: Adequate for Discharge Goal: Ability to tolerate increased activity will improve 11/14/2020 0830 by Conard Novak, RN Outcome: Adequate for Discharge 11/14/2020 0829 by Conard Novak, RN Outcome: Adequate for Discharge Goal: Will remain free from falls 11/14/2020 0830 by Conard Novak, RN Outcome: Adequate for Discharge 11/14/2020 0829 by Conard Novak, RN Outcome: Adequate for Discharge   Problem: Bowel/Gastric: Goal: Gastrointestinal status for postoperative course will improve 11/14/2020 0830 by Conard Novak, RN Outcome: Adequate for Discharge 11/14/2020 [phone] by Conard Novak, RN Outcome: Adequate for Discharge   Problem: Clinical Measurements: Goal: Ability to maintain clinical measurements within normal limits will improve 11/14/2020 0830 by Conard Novak, RN Outcome: Adequate for Discharge 11/14/2020 0829 by Conard Novak, RN Outcome: Adequate for Discharge Goal: Postoperative complications will be avoided or minimized 11/14/2020 0830 by Conard Novak, RN Outcome: Adequate for  Discharge 11/14/2020 0829 by Conard Novak, RN Outcome: Adequate for Discharge Goal: Diagnostic test results will improve 11/14/2020 0830 by Conard Novak, RN Outcome: Adequate for Discharge 11/14/2020 0829 by Conard Novak, RN Outcome: Adequate for Discharge   Problem: Pain Management: Goal: Pain level will decrease 11/14/2020 0830 by Conard Novak, RN Outcome: Adequate for Discharge 11/14/2020 0829 by Conard Novak, RN Outcome: Adequate for Discharge   Problem: Skin Integrity: Goal: Will show signs of wound healing 11/14/2020 0830 by Conard Novak, RN Outcome: Adequate for Discharge 11/14/2020 0829 by Conard Novak, RN Outcome: Adequate for Discharge   Problem: Health Behavior/Discharge Planning: Goal: Identification of resources available to assist in meeting health care needs will improve 11/14/2020 0830 by Conard Novak, RN Outcome: Adequate for Discharge 11/14/2020 0829 by Conard Novak, RN Outcome: Adequate for Discharge   Problem: Bladder/Genitourinary: Goal: Urinary functional status for postoperative course will improve 11/14/2020 0830 by Conard Novak, RN Outcome: Adequate for Discharge 11/14/2020 0829 by Conard Novak, RN Outcome: Adequate for Discharge   Problem: Education: Goal: Knowledge of General Education information will improve Description: Including pain rating scale, medication(s)/side effects and non-pharmacologic comfort measures 11/14/2020 0830 by Conard Novak, RN Outcome: Adequate for Discharge 11/14/2020 0829 by Conard Novak, RN Outcome: Adequate for Discharge   Problem: Health Behavior/Discharge Planning: Goal: Ability to manage health-related needs will improve 11/14/2020 0830 by Conard Novak, RN Outcome: Adequate for Discharge 11/14/2020 0829 by Conard Novak, RN Outcome: Adequate for Discharge   Problem: Clinical Measurements: Goal: Ability to maintain clinical measurements within  normal limits will improve 11/14/2020 0830 by Conard Novak, RN Outcome: Adequate for Discharge 11/14/2020 0829 by Conard Novak, RN Outcome: Adequate for Discharge Goal: Will remain free from infection 11/14/2020 0830 by Conard Novak, RN Outcome: Adequate for Discharge 11/14/2020  0829 by Conard Novak, RN Outcome: Adequate for Discharge Goal: Diagnostic test results will improve 11/14/2020 0830 by Conard Novak, RN Outcome: Adequate for Discharge 11/14/2020 0829 by Conard Novak, RN Outcome: Adequate for Discharge Goal: Respiratory complications will improve 11/14/2020 0830 by Conard Novak, RN Outcome: Adequate for Discharge 11/14/2020 0829 by Conard Novak, RN Outcome: Adequate for Discharge Goal: Cardiovascular complication will be avoided 11/14/2020 0830 by Conard Novak, RN Outcome: Adequate for Discharge 11/14/2020 0829 by Conard Novak, RN Outcome: Adequate for Discharge   Problem: Activity: Goal: Risk for activity intolerance will decrease 11/14/2020 0830 by Conard Novak, RN Outcome: Adequate for Discharge 11/14/2020 [phone] by Conard Novak, RN Outcome: Adequate for Discharge   Problem: Nutrition: Goal: Adequate nutrition will be maintained 11/14/2020 0830 by Conard Novak, RN Outcome: Adequate for Discharge 11/14/2020 0829 by Conard Novak, RN Outcome: Adequate for Discharge   Problem: Coping: Goal: Level of anxiety will decrease 11/14/2020 0830 by Conard Novak, RN Outcome: Adequate for Discharge 11/14/2020 0829 by Conard Novak, RN Outcome: Adequate for Discharge   Problem: Elimination: Goal: Will not experience complications related to bowel motility 11/14/2020 0830 by Conard Novak, RN Outcome: Adequate for Discharge 11/14/2020 0829 by Conard Novak, RN Outcome: Adequate for Discharge Goal: Will not experience complications related to urinary retention 11/14/2020 0830 by Conard Novak,  RN Outcome: Adequate for Discharge 11/14/2020 0829 by Conard Novak, RN Outcome: Adequate for Discharge   Problem: Pain Managment: Goal: General experience of comfort will improve 11/14/2020 0830 by Conard Novak, RN Outcome: Adequate for Discharge 11/14/2020 0829 by Conard Novak, RN Outcome: Adequate for Discharge   Problem: Safety: Goal: Ability to remain free from injury will improve 11/14/2020 0830 by Conard Novak, RN Outcome: Adequate for Discharge 11/14/2020 0829 by Conard Novak, RN Outcome: Adequate for Discharge   Problem: Skin Integrity: Goal: Risk for impaired skin integrity will decrease 11/14/2020 0830 by Conard Novak, RN Outcome: Adequate for Discharge 11/14/2020 0829 by Conard Novak, RN Outcome: Adequate for Discharge   Problem: Acute Rehab PT Goals(only PT should resolve) Goal: Pt Will Go Supine/Side To Sit Outcome: Adequate for Discharge Goal: Pt Will Transfer Bed To Chair/Chair To Bed Outcome: Adequate for Discharge Goal: Pt Will Ambulate Outcome: Adequate for Discharge   Problem: Acute Rehab OT Goals (only OT should resolve) Goal: Pt. Will Perform Upper Body Dressing Outcome: Adequate for Discharge Goal: Pt. Will Perform Lower Body Dressing Outcome: Adequate for Discharge Goal: Pt. Will Transfer To Toilet Outcome: Adequate for Discharge Goal: Pt/Caregiver Will Perform Home Exercise Program Outcome: Adequate for Discharge Goal: OT Additional ADL Goal #1 Outcome: Adequate for Discharge   Problem: Malnutrition  (NI-5.2) Goal: Food and/or nutrient delivery Description: Individualized approach for food/nutrient provision. Outcome: Adequate for Discharge

## 2020-11-16 LAB — CULTURE, BLOOD (ROUTINE X 2)
Culture: NO GROWTH
Culture: NO GROWTH
Special Requests: ADEQUATE
Special Requests: ADEQUATE

## 2020-11-20 DIAGNOSIS — J439 Emphysema, unspecified: Secondary | ICD-10-CM | POA: Diagnosis not present

## 2020-11-20 DIAGNOSIS — Z7984 Long term (current) use of oral hypoglycemic drugs: Secondary | ICD-10-CM | POA: Diagnosis not present

## 2020-11-20 DIAGNOSIS — Z87891 Personal history of nicotine dependence: Secondary | ICD-10-CM | POA: Diagnosis not present

## 2020-11-20 DIAGNOSIS — Z4789 Encounter for other orthopedic aftercare: Secondary | ICD-10-CM | POA: Diagnosis not present

## 2020-11-20 DIAGNOSIS — I2510 Atherosclerotic heart disease of native coronary artery without angina pectoris: Secondary | ICD-10-CM | POA: Diagnosis not present

## 2020-11-20 DIAGNOSIS — K838 Other specified diseases of biliary tract: Secondary | ICD-10-CM | POA: Diagnosis not present

## 2020-11-20 DIAGNOSIS — Z794 Long term (current) use of insulin: Secondary | ICD-10-CM | POA: Diagnosis not present

## 2020-11-20 DIAGNOSIS — I5022 Chronic systolic (congestive) heart failure: Secondary | ICD-10-CM | POA: Diagnosis not present

## 2020-11-20 DIAGNOSIS — R531 Weakness: Secondary | ICD-10-CM | POA: Diagnosis not present

## 2020-11-20 DIAGNOSIS — E119 Type 2 diabetes mellitus without complications: Secondary | ICD-10-CM | POA: Diagnosis not present

## 2020-11-22 DIAGNOSIS — Z794 Long term (current) use of insulin: Secondary | ICD-10-CM | POA: Diagnosis not present

## 2020-11-22 DIAGNOSIS — J439 Emphysema, unspecified: Secondary | ICD-10-CM | POA: Diagnosis not present

## 2020-11-22 DIAGNOSIS — I2510 Atherosclerotic heart disease of native coronary artery without angina pectoris: Secondary | ICD-10-CM | POA: Diagnosis not present

## 2020-11-22 DIAGNOSIS — E119 Type 2 diabetes mellitus without complications: Secondary | ICD-10-CM | POA: Diagnosis not present

## 2020-11-22 DIAGNOSIS — I5022 Chronic systolic (congestive) heart failure: Secondary | ICD-10-CM | POA: Diagnosis not present

## 2020-11-22 DIAGNOSIS — Z7984 Long term (current) use of oral hypoglycemic drugs: Secondary | ICD-10-CM | POA: Diagnosis not present

## 2020-11-22 DIAGNOSIS — K838 Other specified diseases of biliary tract: Secondary | ICD-10-CM | POA: Diagnosis not present

## 2020-11-22 DIAGNOSIS — Z87891 Personal history of nicotine dependence: Secondary | ICD-10-CM | POA: Diagnosis not present

## 2020-11-22 DIAGNOSIS — Z4789 Encounter for other orthopedic aftercare: Secondary | ICD-10-CM | POA: Diagnosis not present

## 2020-11-22 DIAGNOSIS — R531 Weakness: Secondary | ICD-10-CM | POA: Diagnosis not present

## 2020-11-24 DIAGNOSIS — E119 Type 2 diabetes mellitus without complications: Secondary | ICD-10-CM | POA: Diagnosis not present

## 2020-11-24 DIAGNOSIS — K838 Other specified diseases of biliary tract: Secondary | ICD-10-CM | POA: Diagnosis not present

## 2020-11-24 DIAGNOSIS — I5022 Chronic systolic (congestive) heart failure: Secondary | ICD-10-CM | POA: Diagnosis not present

## 2020-11-24 DIAGNOSIS — J439 Emphysema, unspecified: Secondary | ICD-10-CM | POA: Diagnosis not present

## 2020-11-24 DIAGNOSIS — Z4789 Encounter for other orthopedic aftercare: Secondary | ICD-10-CM | POA: Diagnosis not present

## 2020-11-24 DIAGNOSIS — Z7984 Long term (current) use of oral hypoglycemic drugs: Secondary | ICD-10-CM | POA: Diagnosis not present

## 2020-11-24 DIAGNOSIS — R531 Weakness: Secondary | ICD-10-CM | POA: Diagnosis not present

## 2020-11-24 DIAGNOSIS — Z794 Long term (current) use of insulin: Secondary | ICD-10-CM | POA: Diagnosis not present

## 2020-11-24 DIAGNOSIS — I2510 Atherosclerotic heart disease of native coronary artery without angina pectoris: Secondary | ICD-10-CM | POA: Diagnosis not present

## 2020-11-24 DIAGNOSIS — Z87891 Personal history of nicotine dependence: Secondary | ICD-10-CM | POA: Diagnosis not present

## 2020-11-25 ENCOUNTER — Ambulatory Visit: Payer: Medicare Other

## 2020-11-25 ENCOUNTER — Inpatient Hospital Stay: Payer: Medicare Other | Admitting: Family Medicine

## 2020-11-25 ENCOUNTER — Telehealth: Payer: Self-pay

## 2020-11-25 DIAGNOSIS — M792 Neuralgia and neuritis, unspecified: Secondary | ICD-10-CM | POA: Diagnosis not present

## 2020-11-25 DIAGNOSIS — G893 Neoplasm related pain (acute) (chronic): Secondary | ICD-10-CM | POA: Diagnosis not present

## 2020-11-25 DIAGNOSIS — T402X5A Adverse effect of other opioids, initial encounter: Secondary | ICD-10-CM | POA: Diagnosis not present

## 2020-11-25 DIAGNOSIS — K5903 Drug induced constipation: Secondary | ICD-10-CM | POA: Diagnosis not present

## 2020-11-25 DIAGNOSIS — Z515 Encounter for palliative care: Secondary | ICD-10-CM | POA: Diagnosis not present

## 2020-11-25 NOTE — Telephone Encounter (Signed)
This nurse called patient in order to perform scheduled telephonic AWV. Patient stated that she is due to have a telephone visit with her Palliative doctor now and would need to reschedule. We rescheduled for 12/09/2020.

## 2020-11-26 DIAGNOSIS — E119 Type 2 diabetes mellitus without complications: Secondary | ICD-10-CM | POA: Diagnosis not present

## 2020-11-26 DIAGNOSIS — I2510 Atherosclerotic heart disease of native coronary artery without angina pectoris: Secondary | ICD-10-CM | POA: Diagnosis not present

## 2020-11-26 DIAGNOSIS — Z87891 Personal history of nicotine dependence: Secondary | ICD-10-CM | POA: Diagnosis not present

## 2020-11-26 DIAGNOSIS — K838 Other specified diseases of biliary tract: Secondary | ICD-10-CM | POA: Diagnosis not present

## 2020-11-26 DIAGNOSIS — Z4789 Encounter for other orthopedic aftercare: Secondary | ICD-10-CM | POA: Diagnosis not present

## 2020-11-26 DIAGNOSIS — I5022 Chronic systolic (congestive) heart failure: Secondary | ICD-10-CM | POA: Diagnosis not present

## 2020-11-26 DIAGNOSIS — J439 Emphysema, unspecified: Secondary | ICD-10-CM | POA: Diagnosis not present

## 2020-11-26 DIAGNOSIS — Z7984 Long term (current) use of oral hypoglycemic drugs: Secondary | ICD-10-CM | POA: Diagnosis not present

## 2020-11-26 DIAGNOSIS — R531 Weakness: Secondary | ICD-10-CM | POA: Diagnosis not present

## 2020-11-26 DIAGNOSIS — Z794 Long term (current) use of insulin: Secondary | ICD-10-CM | POA: Diagnosis not present

## 2020-12-04 DIAGNOSIS — K838 Other specified diseases of biliary tract: Secondary | ICD-10-CM | POA: Diagnosis not present

## 2020-12-04 DIAGNOSIS — E119 Type 2 diabetes mellitus without complications: Secondary | ICD-10-CM | POA: Diagnosis not present

## 2020-12-04 DIAGNOSIS — I2510 Atherosclerotic heart disease of native coronary artery without angina pectoris: Secondary | ICD-10-CM | POA: Diagnosis not present

## 2020-12-04 DIAGNOSIS — J439 Emphysema, unspecified: Secondary | ICD-10-CM | POA: Diagnosis not present

## 2020-12-04 DIAGNOSIS — Z87891 Personal history of nicotine dependence: Secondary | ICD-10-CM | POA: Diagnosis not present

## 2020-12-04 DIAGNOSIS — I5022 Chronic systolic (congestive) heart failure: Secondary | ICD-10-CM | POA: Diagnosis not present

## 2020-12-04 DIAGNOSIS — R531 Weakness: Secondary | ICD-10-CM | POA: Diagnosis not present

## 2020-12-04 DIAGNOSIS — Z7984 Long term (current) use of oral hypoglycemic drugs: Secondary | ICD-10-CM | POA: Diagnosis not present

## 2020-12-04 DIAGNOSIS — Z4789 Encounter for other orthopedic aftercare: Secondary | ICD-10-CM | POA: Diagnosis not present

## 2020-12-04 DIAGNOSIS — Z794 Long term (current) use of insulin: Secondary | ICD-10-CM | POA: Diagnosis not present

## 2020-12-09 ENCOUNTER — Ambulatory Visit (INDEPENDENT_AMBULATORY_CARE_PROVIDER_SITE_OTHER): Payer: Medicare Other

## 2020-12-09 VITALS — Ht 61.0 in | Wt 128.0 lb

## 2020-12-09 DIAGNOSIS — Z0000 Encounter for general adult medical examination without abnormal findings: Secondary | ICD-10-CM | POA: Diagnosis not present

## 2020-12-09 NOTE — Patient Instructions (Signed)
Renee Mack , Thank you for taking time to come for your Medicare Wellness Visit. I appreciate your ongoing commitment to your health goals. Please review the following plan we discussed and let me know if I can assist you in the future.   Screening recommendations/referrals: Colonoscopy: cologuard 03/26/2019 due 03/25/2022 Mammogram: decline Bone Density: n/a Recommended yearly ophthalmology/optometry visit for glaucoma screening and checkup Recommended yearly dental visit for hygiene and checkup  Vaccinations: Influenza vaccine: completed 04/03/2020, due 01/19/2021 Pneumococcal vaccine: due Tdap vaccine: due Shingles vaccine: discussed  Covid-19: decline  Advanced directives: Advance directive discussed with you today.   Conditions/risks identified: none  Next appointment: Follow up in one year for your annual wellness visit.   Preventive Care 40-64 Years, Female Preventive care refers to lifestyle choices and visits with your health care provider that can promote health and wellness. What does preventive care include? A yearly physical exam. This is also called an annual well check. Dental exams once or twice a year. Routine eye exams. Ask your health care provider how often you should have your eyes checked. Personal lifestyle choices, including: Daily care of your teeth and gums. Regular physical activity. Eating a healthy diet. Avoiding tobacco and drug use. Limiting alcohol use. Practicing safe sex. Taking low-dose aspirin daily starting at age 54. Taking vitamin and mineral supplements as recommended by your health care provider. What happens during an annual well check? The services and screenings done by your health care provider during your annual well check will depend on your age, overall health, lifestyle risk factors, and family history of disease. Counseling  Your health care provider may ask you questions about your: Alcohol use. Tobacco use. Drug  use. Emotional well-being. Home and relationship well-being. Sexual activity. Eating habits. Work and work Statistician. Method of birth control. Menstrual cycle. Pregnancy history. Screening  You may have the following tests or measurements: Height, weight, and BMI. Blood pressure. Lipid and cholesterol levels. These may be checked every 5 years, or more frequently if you are over 92 years old. Skin check. Lung cancer screening. You may have this screening every year starting at age 37 if you have a 30-pack-year history of smoking and currently smoke or have quit within the past 15 years. Fecal occult blood test (FOBT) of the stool. You may have this test every year starting at age 35. Flexible sigmoidoscopy or colonoscopy. You may have a sigmoidoscopy every 5 years or a colonoscopy every 10 years starting at age 20. Hepatitis C blood test. Hepatitis B blood test. Sexually transmitted disease (STD) testing. Diabetes screening. This is done by checking your blood sugar (glucose) after you have not eaten for a while (fasting). You may have this done every 1-3 years. Mammogram. This may be done every 1-2 years. Talk to your health care provider about when you should start having regular mammograms. This may depend on whether you have a family history of breast cancer. BRCA-related cancer screening. This may be done if you have a family history of breast, ovarian, tubal, or peritoneal cancers. Pelvic exam and Pap test. This may be done every 3 years starting at age 45. Starting at age 5, this may be done every 5 years if you have a Pap test in combination with an HPV test. Bone density scan. This is done to screen for osteoporosis. You may have this scan if you are at high risk for osteoporosis. Discuss your test results, treatment options, and if necessary, the need for more tests with  your health care provider. Vaccines  Your health care provider may recommend certain vaccines, such  as: Influenza vaccine. This is recommended every year. Tetanus, diphtheria, and acellular pertussis (Tdap, Td) vaccine. You may need a Td booster every 10 years. Zoster vaccine. You may need this after age 23. Pneumococcal 13-valent conjugate (PCV13) vaccine. You may need this if you have certain conditions and were not previously vaccinated. Pneumococcal polysaccharide (PPSV23) vaccine. You may need one or two doses if you smoke cigarettes or if you have certain conditions. Talk to your health care provider about which screenings and vaccines you need and how often you need them. This information is not intended to replace advice given to you by your health care provider. Make sure you discuss any questions you have with your health care provider. Document Released: 07/04/2015 Document Revised: 02/25/2016 Document Reviewed: 04/08/2015 Elsevier Interactive Patient Education  2017 [place] Prevention in the Home Falls can cause injuries. They can happen to people of all ages. There are many things you can do to make your home safe and to help prevent falls. What can I do on the outside of my home? Regularly fix the edges of walkways and driveways and fix any cracks. Remove anything that might make you trip as you walk through a door, such as a raised step or threshold. Trim any bushes or trees on the path to your home. Use bright outdoor lighting. Clear any walking paths of anything that might make someone trip, such as rocks or tools. Regularly check to see if handrails are loose or broken. Make sure that both sides of any steps have handrails. Any raised decks and porches should have guardrails on the edges. Have any leaves, snow, or ice cleared regularly. Use sand or salt on walking paths during winter. Clean up any spills in your garage right away. This includes oil or grease spills. What can I do in the bathroom? Use night lights. Install grab bars by the toilet and in  the tub and shower. Do not use towel bars as grab bars. Use non-skid mats or decals in the tub or shower. If you need to sit down in the shower, use a plastic, non-slip stool. Keep the floor dry. Clean up any water that spills on the floor as soon as it happens. Remove soap buildup in the tub or shower regularly. Attach bath mats securely with double-sided non-slip rug tape. Do not have throw rugs and other things on the floor that can make you trip. What can I do in the bedroom? Use night lights. Make sure that you have a light by your bed that is easy to reach. Do not use any sheets or blankets that are too big for your bed. They should not hang down onto the floor. Have a firm chair that has side arms. You can use this for support while you get dressed. Do not have throw rugs and other things on the floor that can make you trip. What can I do in the kitchen? Clean up any spills right away. Avoid walking on wet floors. Keep items that you use a lot in easy-to-reach places. If you need to reach something above you, use a strong step stool that has a grab bar. Keep electrical cords out of the way. Do not use floor polish or wax that makes floors slippery. If you must use wax, use non-skid floor wax. Do not have throw rugs and other things on the floor  that can make you trip. What can I do with my stairs? Do not leave any items on the stairs. Make sure that there are handrails on both sides of the stairs and use them. Fix handrails that are broken or loose. Make sure that handrails are as long as the stairways. Check any carpeting to make sure that it is firmly attached to the stairs. Fix any carpet that is loose or worn. Avoid having throw rugs at the top or bottom of the stairs. If you do have throw rugs, attach them to the floor with carpet tape. Make sure that you have a light switch at the top of the stairs and the bottom of the stairs. If you do not have them, ask someone to add them  for you. What else can I do to help prevent falls? Wear shoes that: Do not have high heels. Have rubber bottoms. Are comfortable and fit you well. Are closed at the toe. Do not wear sandals. If you use a stepladder: Make sure that it is fully opened. Do not climb a closed stepladder. Make sure that both sides of the stepladder are locked into place. Ask someone to hold it for you, if possible. Clearly mark and make sure that you can see: Any grab bars or handrails. First and last steps. Where the edge of each step is. Use tools that help you move around (mobility aids) if they are needed. These include: Canes. Walkers. Scooters. Crutches. Turn on the lights when you go into a dark area. Replace any light bulbs as soon as they burn out. Set up your furniture so you have a clear path. Avoid moving your furniture around. If any of your floors are uneven, fix them. If there are any pets around you, be aware of where they are. Review your medicines with your doctor. Some medicines can make you feel dizzy. This can increase your chance of falling. Ask your doctor what other things that you can do to help prevent falls. This information is not intended to replace advice given to you by your health care provider. Make sure you discuss any questions you have with your health care provider. Document Released: 04/03/2009 Document Revised: 11/13/2015 Document Reviewed: 07/12/2014 Elsevier Interactive Patient Education  2017 [employer].

## 2020-12-09 NOTE — Progress Notes (Signed)
I connected with Renee Mack today by telephone and verified that I am speaking with the correct person using two identifiers. Location patient: home Location provider: work Persons participating in the virtual visit: [place], Licklider LPN.   I discussed the limitations, risks, security and privacy concerns of performing an evaluation and management service by telephone and the availability of in person appointments. I also discussed with the patient that there may be a patient responsible charge related to this service. The patient expressed understanding and verbally consented to this telephonic visit.    Interactive audio and video telecommunications were attempted between this provider and patient, however failed, due to patient having technical difficulties OR patient did not have access to video capability.  We continued and completed visit with audio only.     Vital signs may be patient reported or missing.  Subjective:   Renee Mack is a 55 y.o. female who presents for Medicare Annual (Subsequent) preventive examination.  Review of Systems     Cardiac Risk Factors include: diabetes mellitus;dyslipidemia;sedentary lifestyle     Objective:    Today's Vitals   12/09/20 1137 12/09/20 1138  Weight: 128 lb (58.1 kg)   Height: 5\' 1"  (1.549 m)   PainSc:  7    Body mass index is 24.19 kg/m.  Advanced Directives 12/09/2020 11/06/2020 10/13/2020 10/10/2020 09/18/2020 04/01/2020 03/30/2020  Does Patient Have a Medical Advance Directive? No No No No No - No  Would patient like information on creating a medical advance directive? - No - Patient declined - No - Patient declined - No - Patient declined -    Current Medications (verified) Outpatient Encounter Medications as of 12/09/2020  Medication Sig   albuterol (PROAIR HFA) 108 (90 Base) MCG/ACT inhaler Inhale 1 puff into the lungs every 6 (six) hours as needed for wheezing or shortness of breath.    aspirin 81 MG  tablet Take 81 mg by mouth daily.   Blood Glucose Monitoring Suppl (GLUCOCOM BLOOD GLUCOSE MONITOR) DEVI 1 m.   clonazePAM (KLONOPIN) 1 MG tablet Take 1 mg by mouth 3 (three) times daily as needed.   esomeprazole (NEXIUM) 40 MG capsule Take 1 capsule (40 mg total) by mouth daily before breakfast.   famotidine (PEPCID) 20 MG tablet Take 1 tablet (20 mg total) by mouth 2 (two) times daily.   insulin isophane & regular human (NOVOLIN 70/30 FLEXPEN) (70-30) 100 UNIT/ML KwikPen Inject 18 Units into the skin 2 (two) times daily.   Lancet Devices (SIMPLE DIAGNOSTICS LANCING DEV) MISC AS DIRECTED   Lidocaine HCl 4 % GEL Apply topically.   methocarbamol (ROBAXIN) 500 MG tablet Take 1 tablet by mouth 4 (four) times daily as needed.   mirtazapine (REMERON) 15 MG tablet Take 15 mg by mouth at bedtime.   nitroGLYCERIN (NITROSTAT) 0.4 MG SL tablet Place 1 tablet (0.4 mg total) under the tongue every 5 (five) minutes as needed for chest pain.   ondansetron (ZOFRAN-ODT) 4 MG disintegrating tablet Take 4 mg by mouth every 8 (eight) hours as needed.   oxycodone (ROXICODONE) 30 MG immediate release tablet Take 30 mg by mouth every 3 (three) hours as needed.   oxyCODONE ER (XTAMPZA ER) 36 MG C12A Take 2 capsules by mouth every 8 (eight) hours.   polyethylene glycol (MIRALAX / GLYCOLAX) 17 g packet Take 17 g by mouth daily.   SSD 1 % cream SMARTSIG:1 Topical 4-5 Times Daily   sucralfate (CARAFATE) 1 g tablet Take 1 g by mouth 4 (  four) times daily.   ticagrelor (BRILINTA) 90 MG TABS tablet Take 1 tablet (90 mg total) by mouth 2 (two) times daily. *NEEDS OFFICE VISIT FOR FURTHER REFILLS-PLEASE CALL [phone] TO SCHEDULE.*   amitriptyline (ELAVIL) 25 MG tablet Take 1 tablet (25 mg total) by mouth at bedtime. (Patient taking differently: Take 75 mg by mouth at bedtime.)   amLODipine (NORVASC) 5 MG tablet Take 1 tablet (5 mg total) by mouth daily. (Patient not taking: Reported on 12/09/2020)   atorvastatin (LIPITOR) 80  MG tablet Take 1 tablet (80 mg total) by mouth daily at 6 PM.   oxyCODONE (OXYCONTIN) 80 mg 12 hr tablet Take 1 tablet by mouth every 8 (eight) hours. (Patient not taking: Reported on 12/09/2020)   ranolazine (RANEXA) 500 MG 12 hr tablet Take 1 tablet (500 mg total) by mouth 2 (two) times daily.   No facility-administered encounter medications on file as of 12/09/2020.    Allergies (verified) Bee venom, Metformin and related, Darvon [propoxyphene], Gabapentin, Nsaids, Tramadol, and Contrast media [iodinated diagnostic agents]   History: Past Medical History:  Diagnosis Date   Cancer ([place])    vulvular   Cervical disc disease    a. 01/2018 MRI Cervical spine: Cervical spondylosis with multilevel disc and facet degeneration greatest at C5-6 and C6-7.  Moderate to sev R C5-6, mod L C5-6, and mod bilat C6-7 foraminal stenosis with multilevel mild foraminal stenosis.  Mild C5-6 and mod C6-7 canal stenosis. C6-7 central cord impingement w/ cord flattening.   Chronic combined systolic (congestive) and diastolic (congestive) heart failure ([place])    a. 12/2017 Echo: EF 30-35%, mid-apicalanteroseptal, ant, and apical AK. Gr1 DD. Mild conc LVH; b. 03/2018 Echo: EF 45-50%, antsept, ant HK. Mild MR. Nl LA size. Nl RV fxn.   COPD (chronic obstructive pulmonary disease) ([facility])    not on home oxygen   Coronary artery disease    a. 12/2017 ACS/PCI: LAD 100p (2.25x26 Resolute Onyx DES), 56m (2.0x12 Resolute Onyx DES), 80d (2.0x15 Resolute Onyx DES), RCA 90p (non-dominant). EF 25-35%. Post-MI course complicated by CGS; b. 02/2425 NSTEMI/subacute thrombosis-->LAD 100 (PTCA + DES x 1); c. 01/2018 NSTEMI/PCI: LM min irregs, LAD 50-60p ISR/hazy (3.5x12 Resolute DES), 15m/d (underexpansion of prior stents-->PTCA). EF 45-50%.   GERD (gastroesophageal reflux disease)    H/O degenerative disc disease    Hypotension    Ischemic cardiomyopathy    a. 12/2017 Echo: EF 30-35%; b. 03/2018 Echo: EF 45-50%.   Myocardial infarction  ([place])    a. 12/2017-->DES to LAD x 3.   Pancreatitis    Shingles    Spinal stenosis    Tobacco abuse    Ulcer (traumatic) of oral mucosa    Urinary incontinence    VIN III (vulvar intraepithelial neoplasia III)    Past Surgical History:  Procedure Laterality Date   ABDOMINAL HYSTERECTOMY     partial   ANTERIOR CERVICAL DECOMP/DISCECTOMY FUSION N/A 11/06/2020   Procedure: ANTERIOR CERVICAL DECOMPRESSION/DISCECTOMY FUSION 2 LEVELS C5-C7;  Surgeon: Meade Maw, MD;  Location: [facility];  Service: Neurosurgery;  Laterality: N/A;   CORONARY BALLOON ANGIOPLASTY N/A 05/22/2018   Procedure: CORONARY BALLOON ANGIOPLASTY;  Surgeon: Wellington Hampshire, MD;  Location: [place] [facility];  Service: Cardiovascular;  Laterality: N/A;   CORONARY STENT INTERVENTION N/A 12/26/2017   Procedure: CORONARY STENT INTERVENTION;  Surgeon: Wellington Hampshire, MD;  Location: [place] [facility];  Service: Cardiovascular;  Laterality: N/A;   CORONARY STENT INTERVENTION N/A 02/08/2018   Procedure: CORONARY STENT INTERVENTION;  Surgeon: Nelva Bush, MD;  Location: [place] [facility];  Service: Cardiovascular;  Laterality: N/A;   CORONARY/GRAFT ACUTE MI REVASCULARIZATION N/A 12/19/2017   Procedure: Coronary/Graft Acute MI Revascularization;  Surgeon: Wellington Hampshire, MD;  Location: [place] [facility];  Service: Cardiovascular;  Laterality: N/A;   ECTOPIC PREGNANCY SURGERY Left    INTRAVASCULAR ULTRASOUND/IVUS N/A 02/08/2018   Procedure: Intravascular Ultrasound/IVUS;  Surgeon: Nelva Bush, MD;  Location: [place] [facility];  Service: Cardiovascular;  Laterality: N/A;   LEFT HEART CATH AND CORONARY ANGIOGRAPHY N/A 12/19/2017   Procedure: LEFT HEART CATH AND CORONARY ANGIOGRAPHY;  Surgeon: Wellington Hampshire, MD;  Location: [place] [facility];  Service: Cardiovascular;  Laterality: N/A;   LEFT HEART CATH AND CORONARY ANGIOGRAPHY N/A 12/26/2017   Procedure: LEFT HEART CATH AND CORONARY ANGIOGRAPHY;   Surgeon: Wellington Hampshire, MD;  Location: [place] [facility];  Service: Cardiovascular;  Laterality: N/A;   LEFT HEART CATH AND CORONARY ANGIOGRAPHY N/A 02/08/2018   Procedure: LEFT HEART CATH AND CORONARY ANGIOGRAPHY;  Surgeon: Nelva Bush, MD;  Location: [place] [facility];  Service: Cardiovascular;  Laterality: N/A;   LEFT HEART CATH AND CORONARY ANGIOGRAPHY N/A 05/22/2018   Procedure: LEFT HEART CATH AND CORONARY ANGIOGRAPHY;  Surgeon: Wellington Hampshire, MD;  Location: [place] [facility];  Service: Cardiovascular;  Laterality: N/A;   LEFT HEART CATH AND CORONARY ANGIOGRAPHY N/A 06/15/2018   Procedure: LEFT HEART CATH AND CORONARY ANGIOGRAPHY;  Surgeon: Wellington Hampshire, MD;  Location: [place] [facility];  Service: Cardiovascular;  Laterality: N/A;   LYMPHADENECTOMY     SPLENECTOMY, PARTIAL     vulvulasectomy     Family History  Problem Relation Age of Onset   Non-Hodgkin's lymphoma Mother    Osteoporosis Mother    Lupus Sister    Heart disease Brother    Heart attack Brother    Heart attack Father    Social History   Socioeconomic History   Marital status: Widowed    Spouse name: Not on file   Number of children: 4   Years of education: Not on file   Highest education level: Not on file  Occupational History   Occupation: disabled  Tobacco Use   Smoking status: Former    Packs/day: 0.50    Years: 30.00    Pack years: 15.00    Types: Cigarettes    Quit date: 12/19/2017    Years since quitting: 2.9   Smokeless tobacco: Former   Tobacco comments:    quit 12/19/2017.  Vaping Use   Vaping Use: Never used  Substance and Sexual Activity   Alcohol use: No   Drug use: Yes    Types: Oxycodone   Sexual activity: Not Currently  Other Topics Concern   Not on file  Social History Narrative   Lives in [place] by herself.  Does not routinely exercise.   Social Determinants of Health   Financial Resource Strain: Low Risk    Difficulty of Paying Living Expenses:  Not hard at all  Food Insecurity: No Food Insecurity   Worried About Charity fundraiser in the Last Year: Never true   [place] in the Last Year: Never true  Transportation Needs: No Transportation Needs   Lack of Transportation (Medical): No   Lack of Transportation (Non-Medical): No  Physical Activity: Inactive   Days of Exercise per Week: 0 days   Minutes of Exercise per Session: 0 min  Stress: Stress Concern Present  Feeling of Stress : Rather much  Social Connections: Not on file    Tobacco Counseling Counseling given: Not Answered Tobacco comments: quit 12/19/2017.   Clinical Intake:  Pre-visit preparation completed: Yes  Pain : 0-10 Pain Score: 7  Pain Location: Vagina Pain Descriptors / Indicators: Constant, Sharp Pain Onset: More than a month ago Pain Frequency: Constant     Nutritional Status: BMI of 19-24  Normal Nutritional Risks: None Diabetes: Yes  How often do you need to have someone help you when you read instructions, pamphlets, or other written materials from your doctor or pharmacy?: 1 - Never What is the last grade level you completed in school?: 11th grade  Diabetic? Yes Nutrition Risk Assessment:  Has the patient had any N/V/D within the last 2 months?  Yes  Does the patient have any non-healing wounds?  No  Has the patient had any unintentional weight loss or weight gain?  Yes   Diabetes:  Is the patient diabetic?  Yes  If diabetic, was a CBG obtained today?  No  Did the patient bring in their glucometer from home?  No  How often do you monitor your CBG's? 4 times daily.   Financial Strains and Diabetes Management:  Are you having any financial strains with the device, your supplies or your medication? No .  Does the patient want to be seen by Chronic Care Management for management of their diabetes?  No  Would the patient like to be referred to a Nutritionist or for Diabetic Management?  No   Diabetic Exams:  Diabetic Eye  Exam: Overdue for diabetic eye exam. Pt has been advised about the importance in completing this exam. Patient advised to call and schedule an eye exam. Diabetic Foot Exam: Overdue, Pt has been advised about the importance in completing this exam. Pt is scheduled for diabetic foot exam on next appointment.   Interpreter Needed?: No  Information entered by :: NAllen LPN   Activities of Daily Living In your present state of health, do you have any difficulty performing the following activities: 12/09/2020 11/06/2020  Hearing? N N  Vision? Y N  Comment vision gets blurry at times -  Difficulty concentrating or making decisions? N Y  Walking or climbing stairs? Y N  Dressing or bathing? N N  Doing errands, shopping? N Y  Conservation officer, nature and eating ? N -  Using the Toilet? N -  In the past six months, have you accidently leaked urine? Y -  Do you have problems with loss of bowel control? Y -  Managing your Medications? N -  Managing your Finances? N -  Housekeeping or managing your Housekeeping? N -  Some recent data might be hidden    Patient Care Team: Olin Hauser, DO as PCP - General (Family Medicine) Wellington Hampshire, MD as PCP - Cardiology (Cardiology) Clent Jacks, RN as Registered Nurse Morton Stall, Howell Rucks, NP as Nurse Practitioner (Nurse Practitioner) Shirline Frees, MD as Referring Physician (Radiation Oncology)  Indicate any recent Medical Services you may have received from other than [facility] providers in the past year (date may be approximate).     Assessment:   This is a routine wellness examination for [place].  Hearing/Vision screen Vision Screening - Comments:: Regular eye exams, [place]  Dietary issues and exercise activities discussed: Current Exercise Habits: The patient does not participate in regular exercise at present   Goals Addressed  This Visit's Progress    Patient Stated       12/09/2020, decrease anxiety and  resolve constipation        Depression Screen PHQ 2/9 Scores 12/09/2020 11/20/2019 07/27/2019 04/26/2019 03/14/2019 12/07/2018 09/04/2018  PHQ - 2 Score 0 3 1 4 6 4 4   PHQ- 9 Score - 14 5 14 11 11 14     Fall Risk Fall Risk  12/09/2020 11/20/2019 03/14/2019 05/31/2018  Falls in the past year? 0 0 0 0  Number falls in past yr: - 0 0 -  Injury with Fall? - 0 0 -  Risk for fall due to : Impaired balance/gait;Medication side effect - - -  Follow up Falls evaluation completed;Education provided;Falls prevention discussed - - Falls evaluation completed    FALL RISK PREVENTION PERTAINING TO THE HOME:  Any stairs in or around the home? No  If so, are there any without handrails? N/a Home free of loose throw rugs in walkways, pet beds, electrical cords, etc? Yes  Adequate lighting in your home to reduce risk of falls? Yes   ASSISTIVE DEVICES UTILIZED TO PREVENT FALLS:  Life alert? No  Use of a cane, walker or w/c? Yes  Grab bars in the bathroom? Yes  Shower chair or bench in shower? No  Elevated toilet seat or a handicapped toilet? Yes   TIMED UP AND GO:  Was the test performed? No .      Cognitive Function:     6CIT Screen 12/09/2020  What Year? 0 points  What month? 0 points  What time? 0 points  Count back from 20 0 points  Months in reverse 4 points  Repeat phrase 2 points  Total Score 6    Immunizations Immunization History  Administered Date(s) Administered   Hepatitis B, adult 04/28/2006, 05/30/2006, 10/26/2006   Influenza, Seasonal, Injecte, Preservative Fre 03/30/2006, 05/12/2007, 03/11/2008, 07/16/2009, 03/13/2010, 03/18/2011, 07/10/2012, 05/08/2013   Influenza,inj,Quad PF,6+ Mos 03/01/2014, 07/03/2016, 06/15/2017, 02/27/2018, 04/03/2020   Pneumococcal Polysaccharide-23 03/30/2006, 03/11/2008, 06/15/2017    TDAP status: Due, Education has been provided regarding the importance of this vaccine. Advised may receive this vaccine at local pharmacy or Health Dept. Aware  to provide a copy of the vaccination record if obtained from local pharmacy or Health Dept. Verbalized acceptance and understanding.  Flu Vaccine status: Up to date  Pneumococcal vaccine status: Due, Education has been provided regarding the importance of this vaccine. Advised may receive this vaccine at local pharmacy or Health Dept. Aware to provide a copy of the vaccination record if obtained from local pharmacy or Health Dept. Verbalized acceptance and understanding.  Covid-19 vaccine status: Declined, Education has been provided regarding the importance of this vaccine but patient still declined. Advised may receive this vaccine at local pharmacy or Health Dept.or vaccine clinic. Aware to provide a copy of the vaccination record if obtained from local pharmacy or Health Dept. Verbalized acceptance and understanding.  Qualifies for Shingles Vaccine? Yes   Zostavax completed No   Shingrix Completed?: No.    Education has been provided regarding the importance of this vaccine. Patient has been advised to call insurance company to determine out of pocket expense if they have not yet received this vaccine. Advised may also receive vaccine at local pharmacy or Health Dept. Verbalized acceptance and understanding.  Screening Tests Health Maintenance  Topic Date Due   COVID-19 Vaccine (1) Never done   Pneumococcal Vaccine 27-17 Years old (1 - PCV) Never done   OPHTHALMOLOGY EXAM  Never  done   Hepatitis C Screening  Never done   TETANUS/TDAP  Never done   Zoster Vaccines- Shingrix (1 of 2) Never done   MAMMOGRAM  Never done   FOOT EXAM  04/25/2020   URINE MICROALBUMIN  04/25/2020   INFLUENZA VACCINE  01/19/2021   HEMOGLOBIN A1C  05/09/2021   PAP SMEAR-Modifier  05/24/2021   Fecal DNA (Cologuard)  03/25/2022   PNEUMOCOCCAL POLYSACCHARIDE VACCINE AGE 53-64 HIGH RISK  Completed   HIV Screening  Completed   HPV VACCINES  Aged Out    Health Maintenance  Health Maintenance Due  Topic Date Due    COVID-19 Vaccine (1) Never done   Pneumococcal Vaccine 1-28 Years old (1 - PCV) Never done   OPHTHALMOLOGY EXAM  Never done   Hepatitis C Screening  Never done   TETANUS/TDAP  Never done   Zoster Vaccines- Shingrix (1 of 2) Never done   MAMMOGRAM  Never done   FOOT EXAM  04/25/2020   URINE MICROALBUMIN  04/25/2020    Colorectal cancer screening: Type of screening: Cologuard. Completed 03/26/2019. Repeat every 3 years  Mammogram status:decline  Bone Density status: n/a  Lung Cancer Screening: (Low Dose CT Chest recommended if Age 82-80 years, 30 pack-year currently smoking OR have quit w/in 15years.) does not qualify.   Lung Cancer Screening Referral: no  Additional Screening:  Hepatitis C Screening: does qualify; due  Vision Screening: Recommended annual ophthalmology exams for early detection of glaucoma and other disorders of the eye. Is the patient up to date with their annual eye exam?  No  Who is the provider or what is the name of the office in which the patient attends annual eye exams? [facility] If pt is not established with a provider, would they like to be referred to a provider to establish care? No .   Dental Screening: Recommended annual dental exams for proper oral hygiene  Community Resource Referral / Chronic Care Management: CRR required this visit?  No   CCM required this visit?  No      Plan:     I have personally reviewed and noted the following in the patient's chart:   Medical and social history Use of alcohol, tobacco or illicit drugs  Current medications and supplements including opioid prescriptions.  Functional ability and status Nutritional status Physical activity Advanced directives List of other physicians Hospitalizations, surgeries, and ER visits in previous 12 months Vitals Screenings to include cognitive, depression, and falls Referrals and appointments  In addition, I have reviewed and discussed with patient certain  preventive protocols, quality metrics, and best practice recommendations. A written personalized care plan for preventive services as well as general preventive health recommendations were provided to patient.     Kellie Simmering, LPN   10/07/6220   Nurse Notes:

## 2020-12-11 DIAGNOSIS — Z7984 Long term (current) use of oral hypoglycemic drugs: Secondary | ICD-10-CM | POA: Diagnosis not present

## 2020-12-11 DIAGNOSIS — Z794 Long term (current) use of insulin: Secondary | ICD-10-CM | POA: Diagnosis not present

## 2020-12-11 DIAGNOSIS — I2510 Atherosclerotic heart disease of native coronary artery without angina pectoris: Secondary | ICD-10-CM | POA: Diagnosis not present

## 2020-12-11 DIAGNOSIS — I5022 Chronic systolic (congestive) heart failure: Secondary | ICD-10-CM | POA: Diagnosis not present

## 2020-12-11 DIAGNOSIS — E119 Type 2 diabetes mellitus without complications: Secondary | ICD-10-CM | POA: Diagnosis not present

## 2020-12-11 DIAGNOSIS — Z4789 Encounter for other orthopedic aftercare: Secondary | ICD-10-CM | POA: Diagnosis not present

## 2020-12-11 DIAGNOSIS — Z87891 Personal history of nicotine dependence: Secondary | ICD-10-CM | POA: Diagnosis not present

## 2020-12-11 DIAGNOSIS — K838 Other specified diseases of biliary tract: Secondary | ICD-10-CM | POA: Diagnosis not present

## 2020-12-11 DIAGNOSIS — J439 Emphysema, unspecified: Secondary | ICD-10-CM | POA: Diagnosis not present

## 2020-12-11 DIAGNOSIS — R531 Weakness: Secondary | ICD-10-CM | POA: Diagnosis not present

## 2020-12-12 DIAGNOSIS — I2510 Atherosclerotic heart disease of native coronary artery without angina pectoris: Secondary | ICD-10-CM | POA: Diagnosis not present

## 2020-12-12 DIAGNOSIS — E119 Type 2 diabetes mellitus without complications: Secondary | ICD-10-CM | POA: Diagnosis not present

## 2020-12-12 DIAGNOSIS — J439 Emphysema, unspecified: Secondary | ICD-10-CM | POA: Diagnosis not present

## 2020-12-12 DIAGNOSIS — R531 Weakness: Secondary | ICD-10-CM | POA: Diagnosis not present

## 2020-12-12 DIAGNOSIS — Z794 Long term (current) use of insulin: Secondary | ICD-10-CM | POA: Diagnosis not present

## 2020-12-12 DIAGNOSIS — Z7984 Long term (current) use of oral hypoglycemic drugs: Secondary | ICD-10-CM | POA: Diagnosis not present

## 2020-12-12 DIAGNOSIS — Z87891 Personal history of nicotine dependence: Secondary | ICD-10-CM | POA: Diagnosis not present

## 2020-12-12 DIAGNOSIS — Z4789 Encounter for other orthopedic aftercare: Secondary | ICD-10-CM | POA: Diagnosis not present

## 2020-12-12 DIAGNOSIS — K838 Other specified diseases of biliary tract: Secondary | ICD-10-CM | POA: Diagnosis not present

## 2020-12-12 DIAGNOSIS — I5022 Chronic systolic (congestive) heart failure: Secondary | ICD-10-CM | POA: Diagnosis not present

## 2020-12-15 DIAGNOSIS — Z87891 Personal history of nicotine dependence: Secondary | ICD-10-CM | POA: Diagnosis not present

## 2020-12-15 DIAGNOSIS — Z794 Long term (current) use of insulin: Secondary | ICD-10-CM | POA: Diagnosis not present

## 2020-12-15 DIAGNOSIS — Z7984 Long term (current) use of oral hypoglycemic drugs: Secondary | ICD-10-CM | POA: Diagnosis not present

## 2020-12-15 DIAGNOSIS — R531 Weakness: Secondary | ICD-10-CM | POA: Diagnosis not present

## 2020-12-15 DIAGNOSIS — J439 Emphysema, unspecified: Secondary | ICD-10-CM | POA: Diagnosis not present

## 2020-12-15 DIAGNOSIS — I5022 Chronic systolic (congestive) heart failure: Secondary | ICD-10-CM | POA: Diagnosis not present

## 2020-12-15 DIAGNOSIS — K838 Other specified diseases of biliary tract: Secondary | ICD-10-CM | POA: Diagnosis not present

## 2020-12-15 DIAGNOSIS — Z4789 Encounter for other orthopedic aftercare: Secondary | ICD-10-CM | POA: Diagnosis not present

## 2020-12-15 DIAGNOSIS — E119 Type 2 diabetes mellitus without complications: Secondary | ICD-10-CM | POA: Diagnosis not present

## 2020-12-15 DIAGNOSIS — I2510 Atherosclerotic heart disease of native coronary artery without angina pectoris: Secondary | ICD-10-CM | POA: Diagnosis not present

## 2020-12-18 DIAGNOSIS — M4322 Fusion of spine, cervical region: Secondary | ICD-10-CM | POA: Diagnosis not present

## 2020-12-18 DIAGNOSIS — Z981 Arthrodesis status: Secondary | ICD-10-CM | POA: Diagnosis not present

## 2020-12-19 DIAGNOSIS — Z87891 Personal history of nicotine dependence: Secondary | ICD-10-CM | POA: Diagnosis not present

## 2020-12-19 DIAGNOSIS — Z7984 Long term (current) use of oral hypoglycemic drugs: Secondary | ICD-10-CM | POA: Diagnosis not present

## 2020-12-19 DIAGNOSIS — E119 Type 2 diabetes mellitus without complications: Secondary | ICD-10-CM | POA: Diagnosis not present

## 2020-12-19 DIAGNOSIS — K838 Other specified diseases of biliary tract: Secondary | ICD-10-CM | POA: Diagnosis not present

## 2020-12-19 DIAGNOSIS — Z4789 Encounter for other orthopedic aftercare: Secondary | ICD-10-CM | POA: Diagnosis not present

## 2020-12-19 DIAGNOSIS — I2510 Atherosclerotic heart disease of native coronary artery without angina pectoris: Secondary | ICD-10-CM | POA: Diagnosis not present

## 2020-12-19 DIAGNOSIS — R531 Weakness: Secondary | ICD-10-CM | POA: Diagnosis not present

## 2020-12-19 DIAGNOSIS — I5022 Chronic systolic (congestive) heart failure: Secondary | ICD-10-CM | POA: Diagnosis not present

## 2020-12-19 DIAGNOSIS — J439 Emphysema, unspecified: Secondary | ICD-10-CM | POA: Diagnosis not present

## 2020-12-19 DIAGNOSIS — Z794 Long term (current) use of insulin: Secondary | ICD-10-CM | POA: Diagnosis not present

## 2020-12-24 ENCOUNTER — Encounter: Payer: Self-pay | Admitting: *Deleted

## 2020-12-24 ENCOUNTER — Emergency Department
Admission: EM | Admit: 2020-12-24 | Discharge: 2020-12-24 | Disposition: A | Discharge status: Home / Self Care | Payer: Medicare Other | Attending: Emergency Medicine | Admitting: Emergency Medicine

## 2020-12-24 ENCOUNTER — Other Ambulatory Visit: Payer: Self-pay

## 2020-12-24 ENCOUNTER — Emergency Department: Payer: Medicare Other

## 2020-12-24 DIAGNOSIS — R1030 Lower abdominal pain, unspecified: Secondary | ICD-10-CM | POA: Diagnosis present

## 2020-12-24 DIAGNOSIS — R1084 Generalized abdominal pain: Secondary | ICD-10-CM | POA: Diagnosis not present

## 2020-12-24 DIAGNOSIS — Z87891 Personal history of nicotine dependence: Secondary | ICD-10-CM | POA: Insufficient documentation

## 2020-12-24 DIAGNOSIS — Z7982 Long term (current) use of aspirin: Secondary | ICD-10-CM | POA: Insufficient documentation

## 2020-12-24 DIAGNOSIS — Z8544 Personal history of malignant neoplasm of other female genital organs: Secondary | ICD-10-CM | POA: Insufficient documentation

## 2020-12-24 DIAGNOSIS — E1110 Type 2 diabetes mellitus with ketoacidosis without coma: Secondary | ICD-10-CM | POA: Diagnosis not present

## 2020-12-24 DIAGNOSIS — J449 Chronic obstructive pulmonary disease, unspecified: Secondary | ICD-10-CM | POA: Insufficient documentation

## 2020-12-24 DIAGNOSIS — I700 Atherosclerosis of aorta: Secondary | ICD-10-CM | POA: Diagnosis not present

## 2020-12-24 DIAGNOSIS — F1123 Opioid dependence with withdrawal: Secondary | ICD-10-CM | POA: Diagnosis not present

## 2020-12-24 DIAGNOSIS — I5042 Chronic combined systolic (congestive) and diastolic (congestive) heart failure: Secondary | ICD-10-CM | POA: Insufficient documentation

## 2020-12-24 DIAGNOSIS — M419 Scoliosis, unspecified: Secondary | ICD-10-CM | POA: Diagnosis not present

## 2020-12-24 DIAGNOSIS — Z794 Long term (current) use of insulin: Secondary | ICD-10-CM | POA: Diagnosis not present

## 2020-12-24 DIAGNOSIS — R52 Pain, unspecified: Secondary | ICD-10-CM | POA: Diagnosis not present

## 2020-12-24 DIAGNOSIS — I2510 Atherosclerotic heart disease of native coronary artery without angina pectoris: Secondary | ICD-10-CM | POA: Diagnosis not present

## 2020-12-24 DIAGNOSIS — F1193 Opioid use, unspecified with withdrawal: Secondary | ICD-10-CM

## 2020-12-24 DIAGNOSIS — N3000 Acute cystitis without hematuria: Secondary | ICD-10-CM | POA: Diagnosis not present

## 2020-12-24 DIAGNOSIS — R0902 Hypoxemia: Secondary | ICD-10-CM | POA: Diagnosis not present

## 2020-12-24 DIAGNOSIS — K3189 Other diseases of stomach and duodenum: Secondary | ICD-10-CM | POA: Diagnosis not present

## 2020-12-24 LAB — COMPREHENSIVE METABOLIC PANEL
ALT: 23 U/L (ref 0–44)
AST: 18 U/L (ref 15–41)
Albumin: 3.7 g/dL (ref 3.5–5.0)
Alkaline Phosphatase: 180 U/L — ABNORMAL HIGH (ref 38–126)
Anion gap: 7 (ref 5–15)
BUN: 11 mg/dL (ref 6–20)
CO2: 25 mmol/L (ref 22–32)
Calcium: 9.3 mg/dL (ref 8.9–10.3)
Chloride: 101 mmol/L (ref 98–111)
Creatinine, Ser: 0.48 mg/dL (ref 0.44–1.00)
GFR, Estimated: >60 mL/min (ref >60)
Glucose, Bld: 239 mg/dL — ABNORMAL HIGH (ref 70–99)
Potassium: 4 mmol/L (ref 3.5–5.1)
Sodium: 133 mmol/L — ABNORMAL LOW (ref 135–145)
Total Bilirubin: 0.3 mg/dL (ref 0.3–1.2)
Total Protein: 7.7 g/dL (ref 6.5–8.1)

## 2020-12-24 LAB — URINALYSIS, COMPLETE (UACMP) WITH MICROSCOPIC
Bilirubin Urine: NEGATIVE
Glucose, UA: NEGATIVE mg/dL
Ketones, ur: NEGATIVE mg/dL
Nitrite: NEGATIVE
Protein, ur: NEGATIVE mg/dL
RBC / HPF: >50 RBC/hpf — ABNORMAL HIGH (ref 0–5)
Specific Gravity, Urine: 1.004 — ABNORMAL LOW (ref 1.005–1.030)
WBC, UA: >50 WBC/hpf — ABNORMAL HIGH (ref 0–5)
pH: 6 (ref 5.0–8.0)

## 2020-12-24 LAB — CBC
HCT: 41.3 % (ref 36.0–46.0)
Hemoglobin: 13.6 g/dL (ref 12.0–15.0)
MCH: 31.5 pg (ref 26.0–34.0)
MCHC: 32.9 g/dL (ref 30.0–36.0)
MCV: 95.6 fL (ref 80.0–100.0)
Platelets: 309 10*3/uL (ref 150–400)
RBC: 4.32 MIL/uL (ref 3.87–5.11)
RDW: 14.5 % (ref 11.5–15.5)
WBC: 11.5 10*3/uL — ABNORMAL HIGH (ref 4.0–10.5)
nRBC: 0 % (ref 0.0–0.2)

## 2020-12-24 LAB — CBG MONITORING, ED: Glucose-Capillary: 218 mg/dL — ABNORMAL HIGH (ref 70–99)

## 2020-12-24 LAB — LIPASE, BLOOD: Lipase: 17 U/L (ref 11–51)

## 2020-12-24 MED ORDER — ONDANSETRON 4 MG PO TBDP: 4.0000 mg | ORAL_TABLET | Freq: Three times a day (TID) | ORAL | 0 refills | Status: AC | PRN

## 2020-12-24 MED ORDER — SODIUM CHLORIDE 0.9 % IV BOLUS
1000.0000 mL | Freq: Once | INTRAVENOUS | Status: AC
  Administered 2020-12-24: 1000 mL via INTRAVENOUS

## 2020-12-24 MED ORDER — LORAZEPAM 1 MG PO TABS
1.0000 mg | ORAL_TABLET | Freq: Once | ORAL | Status: AC
  Administered 2020-12-24: 1 mg via ORAL
  Filled 2020-12-24: qty 1

## 2020-12-24 MED ORDER — SODIUM CHLORIDE 0.9 % IV SOLN
1.0000 g | Freq: Once | INTRAVENOUS | Status: AC
  Administered 2020-12-24: 1 g via INTRAVENOUS
  Filled 2020-12-24: qty 10

## 2020-12-24 MED ORDER — LACTATED RINGERS IV BOLUS: 1000.0000 mL | Freq: Once | INTRAVENOUS | Status: DC

## 2020-12-24 MED ORDER — HYDROMORPHONE HCL 1 MG/ML IJ SOLN
4.0000 mg | Freq: Once | INTRAMUSCULAR | Status: AC
  Administered 2020-12-24: 4 mg via INTRAVENOUS
  Filled 2020-12-24: qty 4

## 2020-12-24 MED ORDER — HYDROMORPHONE HCL 2 MG/ML IJ SOLN
4.0000 mg | Freq: Once | INTRAMUSCULAR | Status: DC
Start: 2020-12-24 — End: 2020-12-24

## 2020-12-24 MED ORDER — CEPHALEXIN 500 MG PO CAPS: 500.0000 mg | ORAL_CAPSULE | Freq: Four times a day (QID) | ORAL | 0 refills | Status: AC

## 2020-12-24 NOTE — ED Notes (Addendum)
RN in to d/c pt, paperwork reviewed. Pt monitors and iv removed. Pt informed that this RN needed to obtain a signature for d/c/ pt requested to go to bathroom. When going to bathroom pt had a small episode of bowel incontinence and requested wipes to clean self up. Pt left in room and RN stepped out to give pt privacy. While RN in another pt room pt left prior to signing d/c consent.

## 2020-12-24 NOTE — ED Provider Notes (Signed)
[facility] Emergency Department Provider Note ____________________________________________   Event Date/Time   First MD Initiated Contact with Patient 12/24/20 0543     (approximate)  I have reviewed the triage vital signs and the nursing notes.  HISTORY  Chief Complaint Abdominal Pain   HPI Renee Mack is a 55 y.o. femalewho presents to the ED for evaluation of abd pain.   Chart review indicates chronic pain syndrome due to vulvar cancer taking OxyContin 80 mg and 30 mg oxycodone.   Patient presents to the ED for evaluation of 2 days of recurrent emesis, lower abdominal pain and diarrhea.  No recent antibiotics in the past month.  She reports symptoms starting with recurrent emesis and diarrhea 2 days ago, progressively worsening with pain and generalized weakness.  Reports inability to take her typical medications due to her symptoms.  Past Medical History:  Diagnosis Date   Cancer ([place])    vulvular   Cervical disc disease    a. 01/2018 MRI Cervical spine: Cervical spondylosis with multilevel disc and facet degeneration greatest at C5-6 and C6-7.  Moderate to sev R C5-6, mod L C5-6, and mod bilat C6-7 foraminal stenosis with multilevel mild foraminal stenosis.  Mild C5-6 and mod C6-7 canal stenosis. C6-7 central cord impingement w/ cord flattening.   Chronic combined systolic (congestive) and diastolic (congestive) heart failure ([place])    a. 12/2017 Echo: EF 30-35%, mid-apicalanteroseptal, ant, and apical AK. Gr1 DD. Mild conc LVH; b. 03/2018 Echo: EF 45-50%, antsept, ant HK. Mild MR. Nl LA size. Nl RV fxn.   COPD (chronic obstructive pulmonary disease) (HCC)    not on home oxygen   Coronary artery disease    a. 12/2017 ACS/PCI: LAD 100p (2.25x26 Resolute Onyx DES), 62m (2.0x12 Resolute Onyx DES), 80d (2.0x15 Resolute Onyx DES), RCA 90p (non-dominant). EF 25-35%. Post-MI course complicated by CGS; b. 11/7891 NSTEMI/subacute thrombosis-->LAD 100 (PTCA + DES x  1); c. 01/2018 NSTEMI/PCI: LM min irregs, LAD 50-60p ISR/hazy (3.5x12 Resolute DES), 47m/d (underexpansion of prior stents-->PTCA). EF 45-50%.   GERD (gastroesophageal reflux disease)    H/O degenerative disc disease    Hypotension    Ischemic cardiomyopathy    a. 12/2017 Echo: EF 30-35%; b. 03/2018 Echo: EF 45-50%.   Myocardial infarction ([place])    a. 12/2017-->DES to LAD x 3.   Pancreatitis    Shingles    Spinal stenosis    Tobacco abuse    Ulcer (traumatic) of oral mucosa    Urinary incontinence    VIN III (vulvar intraepithelial neoplasia III)     Patient Active Problem List   Diagnosis Date Noted   Malnutrition of moderate degree 11/12/2020   Spinal stenosis of cervical region 11/06/2020   Diabetes mellitus without complication ([place]) 81/06/7508   Hemoptysis 11/06/2020   Dilated bile duct 11/06/2020   Left arm weakness    Intractable vomiting with nausea 10/10/2020   Diabetic ketoacidosis ([place]) 03/30/2020   DKA (diabetic ketoacidosis) ([place]) 03/30/2020   Vulvar cancer ([place]) 07/27/2019   Palliative care patient 09/04/2018   Hyperglycemia 05/28/2018   Vulvar intraepithelial neoplasia (VIN) grade 3 05/24/2018   History of ST elevation myocardial infarction (STEMI) 02/27/2018   CAD (coronary artery disease) 02/27/2018   Hyperlipidemia associated with type 2 diabetes mellitus ([place]) 02/27/2018   Anxiety associated with depression 25/85/2778   Chronic systolic heart failure ([place])    Chest pain 12/26/2017   Unstable angina (HCC)    Ischemic cardiomyopathy    Former smoker  Protein-calorie malnutrition, severe 07/18/2017   DM (diabetes mellitus) ([place]) 12/28/2014   Centrilobular emphysema ([place]) 12/28/2014   GERD (gastroesophageal reflux disease) 12/28/2014   History of shingles 12/28/2014   Hyperlipidemia LDL goal <70 12/28/2014    Past Surgical History:  Procedure Laterality Date   ABDOMINAL HYSTERECTOMY     partial   ANTERIOR CERVICAL DECOMP/DISCECTOMY FUSION N/A  11/06/2020   Procedure: ANTERIOR CERVICAL DECOMPRESSION/DISCECTOMY FUSION 2 LEVELS C5-C7;  Surgeon: Meade Maw, MD;  Location: [facility] ORS;  Service: Neurosurgery;  Laterality: N/A;   CORONARY BALLOON ANGIOPLASTY N/A 05/22/2018   Procedure: CORONARY BALLOON ANGIOPLASTY;  Surgeon: Wellington Hampshire, MD;  Location: [place] CV LAB;  Service: Cardiovascular;  Laterality: N/A;   CORONARY STENT INTERVENTION N/A 12/26/2017   Procedure: CORONARY STENT INTERVENTION;  Surgeon: Wellington Hampshire, MD;  Location: [place] CV LAB;  Service: Cardiovascular;  Laterality: N/A;   CORONARY STENT INTERVENTION N/A 02/08/2018   Procedure: CORONARY STENT INTERVENTION;  Surgeon: Nelva Bush, MD;  Location: [place] CV LAB;  Service: Cardiovascular;  Laterality: N/A;   CORONARY/GRAFT ACUTE MI REVASCULARIZATION N/A 12/19/2017   Procedure: Coronary/Graft Acute MI Revascularization;  Surgeon: Wellington Hampshire, MD;  Location: [place] [facility] LAB;  Service: Cardiovascular;  Laterality: N/A;   ECTOPIC PREGNANCY SURGERY Left    INTRAVASCULAR ULTRASOUND/IVUS N/A 02/08/2018   Procedure: Intravascular Ultrasound/IVUS;  Surgeon: Nelva Bush, MD;  Location: [place] CV LAB;  Service: Cardiovascular;  Laterality: N/A;   LEFT HEART CATH AND CORONARY ANGIOGRAPHY N/A 12/19/2017   Procedure: LEFT HEART CATH AND CORONARY ANGIOGRAPHY;  Surgeon: Wellington Hampshire, MD;  Location: [place] CV LAB;  Service: Cardiovascular;  Laterality: N/A;   LEFT HEART CATH AND CORONARY ANGIOGRAPHY N/A 12/26/2017   Procedure: LEFT HEART CATH AND CORONARY ANGIOGRAPHY;  Surgeon: Wellington Hampshire, MD;  Location: [place] CV LAB;  Service: Cardiovascular;  Laterality: N/A;   LEFT HEART CATH AND CORONARY ANGIOGRAPHY N/A 02/08/2018   Procedure: LEFT HEART CATH AND CORONARY ANGIOGRAPHY;  Surgeon: Nelva Bush, MD;  Location: [place] CV LAB;  Service: Cardiovascular;  Laterality: N/A;   LEFT HEART CATH AND CORONARY  ANGIOGRAPHY N/A 05/22/2018   Procedure: LEFT HEART CATH AND CORONARY ANGIOGRAPHY;  Surgeon: Wellington Hampshire, MD;  Location: [place] CV LAB;  Service: Cardiovascular;  Laterality: N/A;   LEFT HEART CATH AND CORONARY ANGIOGRAPHY N/A 06/15/2018   Procedure: LEFT HEART CATH AND CORONARY ANGIOGRAPHY;  Surgeon: Wellington Hampshire, MD;  Location: [place] CV LAB;  Service: Cardiovascular;  Laterality: N/A;   LYMPHADENECTOMY     SPLENECTOMY, PARTIAL     vulvulasectomy      Prior to Admission medications   Medication Sig Start Date End Date Taking? Authorizing Provider  albuterol (PROAIR HFA) 108 (90 Base) MCG/ACT inhaler Inhale 1 puff into the lungs every 6 (six) hours as needed for wheezing or shortness of breath.     [provider]  amitriptyline (ELAVIL) 25 MG tablet Take 1 tablet (25 mg total) by mouth at bedtime. Patient taking differently: Take 75 mg by mouth at bedtime. 04/05/20 05/05/20  Shahmehdi, Valeria Batman, MD  amLODipine (NORVASC) 5 MG tablet Take 1 tablet (5 mg total) by mouth daily. Patient not taking: Reported on 12/09/2020 11/14/20   Bonnielee Haff, MD  aspirin 81 MG tablet Take 81 mg by mouth daily.    [provider]  atorvastatin (LIPITOR) 80 MG tablet Take 1 tablet (80 mg total) by mouth daily at 6 PM. 03/05/19 03/30/20  Theora Gianotti, NP  Blood Glucose Monitoring [facility] BLOOD GLUCOSE MONITOR) DEVI 1 m. 11/15/17   Karamalegos, Devonne Doughty, DO  clonazePAM (KLONOPIN) 1 MG tablet Take 1 mg by mouth 3 (three) times daily as needed. 03/19/20   [provider]  esomeprazole (NEXIUM) 40 MG capsule Take 1 capsule (40 mg total) by mouth daily before breakfast. 09/22/20   Parks Ranger, Devonne Doughty, DO  famotidine (PEPCID) 20 MG tablet Take 1 tablet (20 mg total) by mouth 2 (two) times daily. 09/22/20   Karamalegos, Devonne Doughty, DO  insulin isophane & regular human (NOVOLIN 70/30 FLEXPEN) (70-30) 100 UNIT/ML KwikPen Inject 18 Units into the skin  2 (two) times daily. 04/05/20   Deatra James, MD  Lancet Devices (SIMPLE DIAGNOSTICS LANCING DEV) MISC AS DIRECTED 11/15/17   Parks Ranger Devonne Doughty, DO  Lidocaine HCl 4 % GEL Apply topically. 02/05/20   [provider]  methocarbamol (ROBAXIN) 500 MG tablet Take 1 tablet by mouth 4 (four) times daily as needed. 08/01/20   [provider]  mirtazapine (REMERON) 15 MG tablet Take 15 mg by mouth at bedtime. 07/07/18   [provider]  nitroGLYCERIN (NITROSTAT) 0.4 MG SL tablet Place 1 tablet (0.4 mg total) under the tongue every 5 (five) minutes as needed for chest pain. 06/16/18   Gouru, Illene Silver, MD  ondansetron (ZOFRAN-ODT) 4 MG disintegrating tablet Take 4 mg by mouth every 8 (eight) hours as needed. 11/27/19   [provider]  oxyCODONE (OXYCONTIN) 80 mg 12 hr tablet Take 1 tablet by mouth every 8 (eight) hours. Patient not taking: Reported on 12/09/2020    [provider]  oxycodone (ROXICODONE) 30 MG immediate release tablet Take 30 mg by mouth every 3 (three) hours as needed. 04/14/20   [provider]  oxyCODONE ER (XTAMPZA ER) 36 MG C12A Take 2 capsules by mouth every 8 (eight) hours.    [provider]  polyethylene glycol (MIRALAX / GLYCOLAX) 17 g packet Take 17 g by mouth daily. 11/14/20   Bonnielee Haff, MD  ranolazine (RANEXA) 500 MG 12 hr tablet Take 1 tablet (500 mg total) by mouth 2 (two) times daily. 04/05/20 05/05/20  Deatra James, MD  SSD 1 % cream SMARTSIG:1 Topical 4-5 Times Daily 01/25/20   [provider]  sucralfate (CARAFATE) 1 g tablet Take 1 g by mouth 4 (four) times daily. 03/01/20   [provider]  ticagrelor (BRILINTA) 90 MG TABS tablet Take 1 tablet (90 mg total) by mouth 2 (two) times daily. *NEEDS OFFICE VISIT FOR FURTHER REFILLS-PLEASE CALL [phone] TO SCHEDULE.* 05/14/19   Theora Gianotti, NP    Allergies Bee venom, Metformin and related, Darvon [propoxyphene],  Gabapentin, Nsaids, Tramadol, and Contrast media [iodinated diagnostic agents]  Family History  Problem Relation Age of Onset   Non-Hodgkin's lymphoma Mother    Osteoporosis Mother    Lupus Sister    Heart disease Brother    Heart attack Brother    Heart attack Father     Social History Social History   Tobacco Use   Smoking status: Former    Packs/day: 0.50    Years: 30.00    Pack years: 15.00    Types: Cigarettes    Quit date: 12/19/2017    Years since quitting: 3.0   Smokeless tobacco: Former   Tobacco comments:    quit 12/19/2017.  Vaping Use   Vaping Use: Never used  Substance Use Topics   Alcohol use: No  Drug use: Yes    Types: Oxycodone    Review of Systems  Constitutional: No fever/chills Eyes: No visual changes. ENT: No sore throat. Cardiovascular: Denies chest pain. Respiratory: Denies shortness of breath. Gastrointestinal: Positive for abdominal pain, nausea, vomiting and diarrhea No constipation. Genitourinary: Negative for dysuria. Musculoskeletal: Negative for back pain. Skin: Negative for rash. Neurological: Negative for headaches, focal weakness or numbness.  ____________________________________________   PHYSICAL EXAM:  VITAL SIGNS: Vitals:   12/24/20 0630 12/24/20 0634  BP: (!) 98/46 (!) 102/57  Pulse: 89 88  Resp: 14 15  Temp:    SpO2: 94% 93%    Constitutional: Alert and oriented.  Appears uncomfortable but in no acute distress. Eyes: Conjunctivae are normal. PERRL. EOMI. Head: Atraumatic. Nose: No congestion/rhinnorhea. Mouth/Throat: Mucous membranes are moist.  Oropharynx non-erythematous. Neck: No stridor. No cervical spine tenderness to palpation. Cardiovascular: Normal rate, regular rhythm. Grossly normal heart sounds.  Good peripheral circulation. Respiratory: Normal respiratory effort.  No retractions. Lungs CTAB. Gastrointestinal: Soft , nondistended. No CVA tenderness. Diffuse tenderness without peritoneal  features. Musculoskeletal: No lower extremity tenderness nor edema.  No joint effusions. No signs of acute trauma. Neurologic:  Normal speech and language. No gross focal neurologic deficits are appreciated. No gait instability noted. Skin:  Skin is warm, dry and intact. No rash noted. Psychiatric: Mood and affect are normal. Speech and behavior are normal. ____________________________________________   LABS (all labs ordered are listed, but only abnormal results are displayed)  Labs Reviewed  COMPREHENSIVE METABOLIC PANEL - Abnormal; Notable for the following components:      Result Value   Sodium 133 (*)    Glucose, Bld 239 (*)    Alkaline Phosphatase 180 (*)    All other components within normal limits  URINALYSIS, COMPLETE (UACMP) WITH MICROSCOPIC - Abnormal; Notable for the following components:   Color, Urine YELLOW (*)    APPearance TURBID (*)    Specific Gravity, Urine 1.004 (*)    Hgb urine dipstick MODERATE (*)    Leukocytes,Ua TRACE (*)    RBC / HPF >50 (*)    WBC, UA >50 (*)    Bacteria, UA MANY (*)    All other components within normal limits  CBC - Abnormal; Notable for the following components:   WBC 11.5 (*)    All other components within normal limits  CBG MONITORING, ED - Abnormal; Notable for the following components:   Glucose-Capillary 218 (*)    All other components within normal limits  URINE CULTURE  LIPASE, BLOOD   ____________________________________________  12 Lead EKG   ____________________________________________  RADIOLOGY  ED MD interpretation:    Official radiology report(s): No results found.  ____________________________________________   PROCEDURES and INTERVENTIONS  Procedure(s) performed (including Critical Care):  .1-3 Lead EKG Interpretation  Date/Time: 12/24/2020 6:49 AM Performed by: Vladimir Crofts, MD Authorized by: Vladimir Crofts, MD     Interpretation: normal     ECG rate:  84   ECG rate assessment: normal      Rhythm: sinus rhythm     Ectopy: none     Conduction: normal    Medications  cefTRIAXone (ROCEPHIN) 1 g in sodium chloride 0.9 % 100 mL IVPB (1 g Intravenous New Bag/Given 12/24/20 0641)  HYDROmorphone (DILAUDID) injection 4 mg (has no administration in time range)  sodium chloride 0.9 % bolus 1,000 mL (1,000 mLs Intravenous New Bag/Given 12/24/20 9509)    ____________________________________________   MDM / ED COURSE   55 year old female on rather large  doses of opiates this to the ED with evidence of acute cystitis precipitating opiate withdrawals.  Exam with uncomfortable-appearing patient who has no distress.  She is diffusely tender within her abdomen, but no peritoneal features.  Blood work with a minimal leukocytosis, though largely unremarkable.  Urinalysis with infectious features considering her symptoms of dysuria and urinary frequency, for which we will send for culture and start her on antibiotics.  Anticipate acute cystitis precipitating all of her symptoms, then precluding her from taking her typical doses of opiates, precipitating opiate withdrawals and her worsening pain and emesis.  We will rehydrate, provide antibiotics and commiserate doses of her long-term opiates.  CT imaging of abdomen/pelvis pending at time of signout to oncoming provider.  Unless there are any significant surprises on this CT imaging such as SBO, anticipate outpatient management with antibiotics and antiemetics, as long as her symptoms improve with our management.     ____________________________________________   FINAL CLINICAL IMPRESSION(S) / ED DIAGNOSES  Final diagnoses:  Acute cystitis without hematuria  Generalized abdominal pain  Opiate withdrawal [facility])     ED Discharge Orders     None        Arless Vineyard   Note:  This document was prepared using Systems analyst and may include unintentional dictation errors.    Vladimir Crofts, MD 12/24/20 [phone]

## 2020-12-24 NOTE — ED Notes (Signed)
Cystitis, opiate withdrawal   Lucrezia Starch, MD 12/24/20 [phone]

## 2020-12-24 NOTE — ED Provider Notes (Signed)
I sent care of this patient approximately 0 700.  Please often progress note for full details regarding patient's initial evaluation assessment.  In brief patient Renee Mack for assessment of couple days of abdominal pain associate with vomiting diarrhea.  This in the setting of fairly high opioid use at baseline for cervical cancer pain with initial work-up concerning for likely GI symptoms related to acute opioid withdrawal in the setting of cystitis.  CMP shows mild hyperglycemia without evidence of DKA no other significant electrolyte or metabolic derangements.  Lipase not consistent with acute pancreatitis.  UA with trace leukocyte esterase, greater than 50 RBCs and greater than 50 WBCs with many bacteria noted.  Urine culture was sent and patient was given a dose of Rocephin IV fluids in addition to antiemetics and IV analgesia noted below.  Plan is to follow-up CT to rule out other emergent intra-abdominal pathology and if this is reassuring and patient is feeling better on reassessment likely discharge home with outpatient follow-up.  CT abdomen pelvis remarkable for persistent large amount of stool and some soft tissue abnormalities and peritoneum consistent with prior exams but also consistent with possible residual or recurrent disease.  Advised patient of this and recommendation of follow-up with her oncologist if within her goals of care to pursue any additional treatments that she is currently not getting treatment and palliative care.  He was CT also read as potentially fecal retention from pain radiology cannot rule out a very distal obstruction I think this is less likely given patient has been reporting copious diarrhea.  No evidence of metastatic disease or other clear acute abdominopelvic process including kidney stone or pyelonephritis.  On reassessment patient states she feels much better.  She was given a dose of her regular prescribed Ativan for panic attack as prescribed by palliative care.   Rx written for Zofran and Keflex.  Discharged stable condition.  Strict return precautions advised and discussed.  Medications  LORazepam (ATIVAN) tablet 1 mg (has no administration in time range)  cefTRIAXone (ROCEPHIN) 1 g in sodium chloride 0.9 % 100 mL IVPB (0 g Intravenous Stopped 12/24/20 0711)  sodium chloride 0.9 % bolus 1,000 mL (1,000 mLs Intravenous New Bag/Given 12/24/20 0633)  HYDROmorphone (DILAUDID) injection 4 mg (4 mg Intravenous Given 12/24/20 0646)      Lucrezia Starch, MD 12/24/20 [phone]

## 2020-12-24 NOTE — ED Triage Notes (Signed)
Pt arrives via [facility] from home with c/o abdominal pain per report. She has had nausea/vomiting/diarrhea. Hr 81, 97%, 129/79.

## 2020-12-24 NOTE — ED Triage Notes (Signed)
Pt reports she started vomiting 2 days ago, associated now with abdominal pain (upper) and diarrhea. Denies fevers. Not urinating a lot. Unable to hold down her pain medications d/t vomiting. Has been taking zofran without relief. Onset of cough today.

## 2020-12-24 NOTE — ED Notes (Signed)
RN in to admin medication, pt states her BP normally runs on the lower side and that pressure is currently at baseline.

## 2020-12-24 NOTE — ED Notes (Signed)
MD made aware of pt pressure prior to admin of 4mg  dilaudid. MD states due to chronic use of narcotics, pt should tolerate without decreasing BP, and instructed RN to continue with administration of medication dose.

## 2020-12-26 ENCOUNTER — Other Ambulatory Visit: Payer: Self-pay | Admitting: Family Medicine

## 2020-12-26 DIAGNOSIS — R531 Weakness: Secondary | ICD-10-CM | POA: Diagnosis not present

## 2020-12-26 DIAGNOSIS — Z87891 Personal history of nicotine dependence: Secondary | ICD-10-CM | POA: Diagnosis not present

## 2020-12-26 DIAGNOSIS — Z7984 Long term (current) use of oral hypoglycemic drugs: Secondary | ICD-10-CM | POA: Diagnosis not present

## 2020-12-26 DIAGNOSIS — I5022 Chronic systolic (congestive) heart failure: Secondary | ICD-10-CM | POA: Diagnosis not present

## 2020-12-26 DIAGNOSIS — K219 Gastro-esophageal reflux disease without esophagitis: Secondary | ICD-10-CM

## 2020-12-26 DIAGNOSIS — Z794 Long term (current) use of insulin: Secondary | ICD-10-CM | POA: Diagnosis not present

## 2020-12-26 DIAGNOSIS — J439 Emphysema, unspecified: Secondary | ICD-10-CM | POA: Diagnosis not present

## 2020-12-26 DIAGNOSIS — Z4789 Encounter for other orthopedic aftercare: Secondary | ICD-10-CM | POA: Diagnosis not present

## 2020-12-26 DIAGNOSIS — I2510 Atherosclerotic heart disease of native coronary artery without angina pectoris: Secondary | ICD-10-CM | POA: Diagnosis not present

## 2020-12-26 DIAGNOSIS — K838 Other specified diseases of biliary tract: Secondary | ICD-10-CM | POA: Diagnosis not present

## 2020-12-26 DIAGNOSIS — E119 Type 2 diabetes mellitus without complications: Secondary | ICD-10-CM | POA: Diagnosis not present

## 2020-12-26 LAB — URINE CULTURE: Culture: >=100000 — AB

## 2020-12-26 MED ORDER — FAMOTIDINE 20 MG PO TABS: 20.0000 mg | ORAL_TABLET | Freq: Two times a day (BID) | ORAL | 0 refills | Status: AC

## 2020-12-26 MED ORDER — ESOMEPRAZOLE MAGNESIUM 40 MG PO CPDR: 40.0000 mg | DELAYED_RELEASE_CAPSULE | Freq: Every day | ORAL | 0 refills | Status: AC

## 2020-12-26 NOTE — Telephone Encounter (Signed)
Medication: esomeprazole (NEXIUM) 40 MG capsule [[id number]] , famotidine (PEPCID) 20 MG tablet [[id number]]   Has the patient contacted their pharmacy? YES  (Agent: If no, request that the patient contact the pharmacy for the refill.) (Agent: If yes, when and what did the pharmacy advise?)  Preferred Pharmacy (with phone number or street name): [facility] (N), [place] - [address] [place] [address] Phone: [phone] Fax: [phone] Hours: Not open 24 hours    Agent: Please be advised that RX refills may take up to 3 business days. We ask that you follow-up with your pharmacy.

## 2020-12-26 NOTE — Telephone Encounter (Signed)
Requested medication (s) are due for refill today- yes  Requested medication (s) are on the active medication list -yes  Future visit scheduled -yes- in 11 months  Last refill: 09/22/20  Notes to clinic: Patient was to have GI referral for this- not sure this was done with other health issues occurring- sent for review  Requested Prescriptions  Pending Prescriptions Disp Refills   esomeprazole (NEXIUM) 40 MG capsule 90 capsule 0    Sig: Take 1 capsule (40 mg total) by mouth daily before breakfast.      Gastroenterology: Proton Pump Inhibitors Passed - 12/26/2020  9:40 AM      Passed - Valid encounter within last 12 months    Recent Outpatient Visits           8 months ago Gastroesophageal reflux disease without esophagitis   [facility], Lupita Raider, FNP   1 year ago Anxiety associated with depression   [facility] Olin Hauser, DO   1 year ago Uncontrolled type 2 diabetes with neuropathy [facility])   [facility] Olin Hauser, DO   1 year ago Anxiety associated with depression   [place], Devonne Doughty, DO   2 years ago Anxiety associated with depression   [place], DO       Future Appointments             In 11 months [facility], [facility]                famotidine (PEPCID) 20 MG tablet 180 tablet 0    Sig: Take 1 tablet (20 mg total) by mouth 2 (two) times daily.      Gastroenterology:  H2 Antagonists Passed - 12/26/2020  9:40 AM      Passed - Valid encounter within last 12 months    Recent Outpatient Visits           8 months ago Gastroesophageal reflux disease without esophagitis   [place], FNP   1 year ago Anxiety associated with depression   [place], DO   1 year ago Uncontrolled type 2 diabetes with neuropathy [facility])   [facility] Olin Hauser, DO   1 year ago Anxiety associated with depression   [place], DO   2 years ago Anxiety associated with depression   [place], DO       Future Appointments             In 11 months [facility], [facility]                  Requested Prescriptions  Pending Prescriptions Disp Refills   esomeprazole (NEXIUM) 40 MG capsule 90 capsule 0    Sig: Take 1 capsule (40 mg total) by mouth daily before breakfast.      Gastroenterology: Proton Pump Inhibitors Passed - 12/26/2020  9:40 AM      Passed - Valid encounter within last 12 months    Recent Outpatient Visits           8 months ago Gastroesophageal reflux disease without esophagitis   [place], FNP   1 year ago Anxiety associated with depression   [place], DO  1 year ago Uncontrolled type 2 diabetes with neuropathy [facility])   [facility] Olin Hauser, DO   1 year ago Anxiety associated with depression   [place], DO   2 years ago Anxiety associated with depression   [place], DO       Future Appointments             In 11 months [facility], [facility]                famotidine (PEPCID) 20 MG tablet 180 tablet 0    Sig: Take 1 tablet (20 mg total) by mouth 2 (two) times daily.      Gastroenterology:  H2 Antagonists Passed - 12/26/2020  9:40 AM      Passed - Valid encounter within last 12 months    Recent Outpatient Visits           8 months ago Gastroesophageal reflux disease without esophagitis   [place], FNP   1 year ago Anxiety associated with depression   [place], DO   1 year ago  Uncontrolled type 2 diabetes with neuropathy [facility])   [facility] Olin Hauser, DO   1 year ago Anxiety associated with depression   [place], DO   2 years ago Anxiety associated with depression   [place], DO       Future Appointments             In 63 months [facility], [facility]

## 2021-01-09 ENCOUNTER — Telehealth: Payer: Self-pay | Admitting: Family Medicine

## 2021-01-09 DIAGNOSIS — K838 Other specified diseases of biliary tract: Secondary | ICD-10-CM | POA: Diagnosis not present

## 2021-01-09 DIAGNOSIS — Z4789 Encounter for other orthopedic aftercare: Secondary | ICD-10-CM | POA: Diagnosis not present

## 2021-01-09 DIAGNOSIS — I2510 Atherosclerotic heart disease of native coronary artery without angina pectoris: Secondary | ICD-10-CM | POA: Diagnosis not present

## 2021-01-09 DIAGNOSIS — Z87891 Personal history of nicotine dependence: Secondary | ICD-10-CM | POA: Diagnosis not present

## 2021-01-09 DIAGNOSIS — Z7984 Long term (current) use of oral hypoglycemic drugs: Secondary | ICD-10-CM | POA: Diagnosis not present

## 2021-01-09 DIAGNOSIS — R531 Weakness: Secondary | ICD-10-CM | POA: Diagnosis not present

## 2021-01-09 DIAGNOSIS — E119 Type 2 diabetes mellitus without complications: Secondary | ICD-10-CM | POA: Diagnosis not present

## 2021-01-09 DIAGNOSIS — J439 Emphysema, unspecified: Secondary | ICD-10-CM | POA: Diagnosis not present

## 2021-01-09 DIAGNOSIS — Z794 Long term (current) use of insulin: Secondary | ICD-10-CM | POA: Diagnosis not present

## 2021-01-09 DIAGNOSIS — I5022 Chronic systolic (congestive) heart failure: Secondary | ICD-10-CM | POA: Diagnosis not present

## 2021-01-09 NOTE — Telephone Encounter (Signed)
Elta Guadeloupe, from [facility], calling stating that he visited the pt today and that her pulse was 119. He states that the pt has a UTI and this was out of range and just wanted to alert PCP. Please advise.       [phone]

## 2021-01-09 NOTE — Telephone Encounter (Signed)
Okay with elevated pulse due to UTI. No further action needed.  Nobie Putnam, [facility] 01/09/2021, 4:46 PM

## 2021-01-12 DIAGNOSIS — Z794 Long term (current) use of insulin: Secondary | ICD-10-CM | POA: Diagnosis not present

## 2021-01-12 DIAGNOSIS — Z87891 Personal history of nicotine dependence: Secondary | ICD-10-CM | POA: Diagnosis not present

## 2021-01-12 DIAGNOSIS — I5022 Chronic systolic (congestive) heart failure: Secondary | ICD-10-CM | POA: Diagnosis not present

## 2021-01-12 DIAGNOSIS — K838 Other specified diseases of biliary tract: Secondary | ICD-10-CM | POA: Diagnosis not present

## 2021-01-12 DIAGNOSIS — J439 Emphysema, unspecified: Secondary | ICD-10-CM | POA: Diagnosis not present

## 2021-01-12 DIAGNOSIS — R531 Weakness: Secondary | ICD-10-CM | POA: Diagnosis not present

## 2021-01-12 DIAGNOSIS — E119 Type 2 diabetes mellitus without complications: Secondary | ICD-10-CM | POA: Diagnosis not present

## 2021-01-12 DIAGNOSIS — Z7984 Long term (current) use of oral hypoglycemic drugs: Secondary | ICD-10-CM | POA: Diagnosis not present

## 2021-01-12 DIAGNOSIS — I2510 Atherosclerotic heart disease of native coronary artery without angina pectoris: Secondary | ICD-10-CM | POA: Diagnosis not present

## 2021-01-12 DIAGNOSIS — Z4789 Encounter for other orthopedic aftercare: Secondary | ICD-10-CM | POA: Diagnosis not present

## 2021-01-13 DIAGNOSIS — M792 Neuralgia and neuritis, unspecified: Secondary | ICD-10-CM | POA: Diagnosis not present

## 2021-01-13 DIAGNOSIS — G893 Neoplasm related pain (acute) (chronic): Secondary | ICD-10-CM | POA: Diagnosis not present

## 2021-01-13 DIAGNOSIS — Z515 Encounter for palliative care: Secondary | ICD-10-CM | POA: Diagnosis not present

## 2021-01-13 DIAGNOSIS — K5903 Drug induced constipation: Secondary | ICD-10-CM | POA: Diagnosis not present

## 2021-01-13 DIAGNOSIS — T402X5A Adverse effect of other opioids, initial encounter: Secondary | ICD-10-CM | POA: Diagnosis not present

## 2021-01-14 DIAGNOSIS — Z4789 Encounter for other orthopedic aftercare: Secondary | ICD-10-CM | POA: Diagnosis not present

## 2021-01-14 DIAGNOSIS — I5022 Chronic systolic (congestive) heart failure: Secondary | ICD-10-CM | POA: Diagnosis not present

## 2021-01-14 DIAGNOSIS — E119 Type 2 diabetes mellitus without complications: Secondary | ICD-10-CM | POA: Diagnosis not present

## 2021-01-14 DIAGNOSIS — J439 Emphysema, unspecified: Secondary | ICD-10-CM | POA: Diagnosis not present

## 2021-01-14 DIAGNOSIS — Z794 Long term (current) use of insulin: Secondary | ICD-10-CM | POA: Diagnosis not present

## 2021-01-14 DIAGNOSIS — Z87891 Personal history of nicotine dependence: Secondary | ICD-10-CM | POA: Diagnosis not present

## 2021-01-14 DIAGNOSIS — R531 Weakness: Secondary | ICD-10-CM | POA: Diagnosis not present

## 2021-01-14 DIAGNOSIS — Z7984 Long term (current) use of oral hypoglycemic drugs: Secondary | ICD-10-CM | POA: Diagnosis not present

## 2021-01-14 DIAGNOSIS — I2510 Atherosclerotic heart disease of native coronary artery without angina pectoris: Secondary | ICD-10-CM | POA: Diagnosis not present

## 2021-01-14 DIAGNOSIS — K838 Other specified diseases of biliary tract: Secondary | ICD-10-CM | POA: Diagnosis not present

## 2021-01-19 ENCOUNTER — Emergency Department
Admission: EM | Admit: 2021-01-19 | Discharge: 2021-01-19 | Disposition: A | Discharge status: Home / Self Care | Payer: Medicare Other | Attending: Emergency Medicine | Admitting: Emergency Medicine

## 2021-01-19 ENCOUNTER — Emergency Department: Payer: Medicare Other

## 2021-01-19 ENCOUNTER — Other Ambulatory Visit: Payer: Self-pay

## 2021-01-19 ENCOUNTER — Encounter: Payer: Self-pay | Admitting: *Deleted

## 2021-01-19 DIAGNOSIS — I5042 Chronic combined systolic (congestive) and diastolic (congestive) heart failure: Secondary | ICD-10-CM | POA: Insufficient documentation

## 2021-01-19 DIAGNOSIS — M549 Dorsalgia, unspecified: Secondary | ICD-10-CM | POA: Diagnosis not present

## 2021-01-19 DIAGNOSIS — Z87891 Personal history of nicotine dependence: Secondary | ICD-10-CM | POA: Diagnosis not present

## 2021-01-19 DIAGNOSIS — Z79899 Other long term (current) drug therapy: Secondary | ICD-10-CM | POA: Diagnosis not present

## 2021-01-19 DIAGNOSIS — J439 Emphysema, unspecified: Secondary | ICD-10-CM | POA: Diagnosis not present

## 2021-01-19 DIAGNOSIS — R0789 Other chest pain: Secondary | ICD-10-CM | POA: Diagnosis not present

## 2021-01-19 DIAGNOSIS — Z7982 Long term (current) use of aspirin: Secondary | ICD-10-CM | POA: Insufficient documentation

## 2021-01-19 DIAGNOSIS — I2510 Atherosclerotic heart disease of native coronary artery without angina pectoris: Secondary | ICD-10-CM | POA: Insufficient documentation

## 2021-01-19 DIAGNOSIS — I110 Hypertensive heart disease with heart failure: Secondary | ICD-10-CM | POA: Diagnosis not present

## 2021-01-19 DIAGNOSIS — Z955 Presence of coronary angioplasty implant and graft: Secondary | ICD-10-CM | POA: Insufficient documentation

## 2021-01-19 DIAGNOSIS — Z794 Long term (current) use of insulin: Secondary | ICD-10-CM | POA: Diagnosis not present

## 2021-01-19 DIAGNOSIS — R079 Chest pain, unspecified: Secondary | ICD-10-CM

## 2021-01-19 DIAGNOSIS — I701 Atherosclerosis of renal artery: Secondary | ICD-10-CM | POA: Diagnosis not present

## 2021-01-19 DIAGNOSIS — Z8544 Personal history of malignant neoplasm of other female genital organs: Secondary | ICD-10-CM | POA: Insufficient documentation

## 2021-01-19 DIAGNOSIS — I700 Atherosclerosis of aorta: Secondary | ICD-10-CM | POA: Diagnosis not present

## 2021-01-19 DIAGNOSIS — E1110 Type 2 diabetes mellitus with ketoacidosis without coma: Secondary | ICD-10-CM | POA: Diagnosis not present

## 2021-01-19 DIAGNOSIS — J449 Chronic obstructive pulmonary disease, unspecified: Secondary | ICD-10-CM | POA: Diagnosis not present

## 2021-01-19 LAB — BASIC METABOLIC PANEL
Anion gap: 7 (ref 5–15)
BUN: 11 mg/dL (ref 6–20)
CO2: 30 mmol/L (ref 22–32)
Calcium: 8.9 mg/dL (ref 8.9–10.3)
Chloride: 98 mmol/L (ref 98–111)
Creatinine, Ser: 0.51 mg/dL (ref 0.44–1.00)
GFR, Estimated: >60 mL/min (ref >60)
Glucose, Bld: 225 mg/dL — ABNORMAL HIGH (ref 70–99)
Potassium: 4.7 mmol/L (ref 3.5–5.1)
Sodium: 135 mmol/L (ref 135–145)

## 2021-01-19 LAB — CBC
HCT: 39.4 % (ref 36.0–46.0)
Hemoglobin: 12.8 g/dL (ref 12.0–15.0)
MCH: 30.7 pg (ref 26.0–34.0)
MCHC: 32.5 g/dL (ref 30.0–36.0)
MCV: 94.5 fL (ref 80.0–100.0)
Platelets: 288 10*3/uL (ref 150–400)
RBC: 4.17 MIL/uL (ref 3.87–5.11)
RDW: 14.3 % (ref 11.5–15.5)
WBC: 9.4 10*3/uL (ref 4.0–10.5)
nRBC: 0 % (ref 0.0–0.2)

## 2021-01-19 LAB — D-DIMER, QUANTITATIVE: D-Dimer, Quant: 1.75 ug/mL-FEU — ABNORMAL HIGH (ref 0.00–0.50)

## 2021-01-19 LAB — TROPONIN I (HIGH SENSITIVITY)
Troponin I (High Sensitivity): 5 ng/L (ref <18)
Troponin I (High Sensitivity): 5 ng/L (ref <18)

## 2021-01-19 MED ORDER — DIAZEPAM 5 MG PO TABS
5.0000 mg | ORAL_TABLET | Freq: Once | ORAL | Status: AC
  Administered 2021-01-19: 5 mg via ORAL
  Filled 2021-01-19: qty 1

## 2021-01-19 MED ORDER — MORPHINE SULFATE (PF) 4 MG/ML IV SOLN
4.0000 mg | Freq: Once | INTRAVENOUS | Status: AC
  Administered 2021-01-19: 4 mg via INTRAVENOUS
  Filled 2021-01-19: qty 1

## 2021-01-19 MED ORDER — DIPHENHYDRAMINE HCL 25 MG PO CAPS
50.0000 mg | ORAL_CAPSULE | Freq: Once | ORAL | Status: AC
  Administered 2021-01-19: 50 mg via ORAL
  Filled 2021-01-19: qty 2

## 2021-01-19 MED ORDER — DIPHENHYDRAMINE HCL 50 MG/ML IJ SOLN
50.0000 mg | Freq: Once | INTRAMUSCULAR | Status: AC
  Filled 2021-01-19: qty 1

## 2021-01-19 MED ORDER — IOHEXOL 350 MG/ML SOLN
75.0000 mL | Freq: Once | INTRAVENOUS | Status: AC | PRN
  Administered 2021-01-19: 75 mL via INTRAVENOUS

## 2021-01-19 MED ORDER — HYDROMORPHONE HCL 1 MG/ML IJ SOLN
1.0000 mg | Freq: Once | INTRAMUSCULAR | Status: AC
  Administered 2021-01-19: 1 mg via INTRAVENOUS
  Filled 2021-01-19: qty 1

## 2021-01-19 MED ORDER — HYDROCORTISONE NA SUCCINATE PF 250 MG IJ SOLR
200.0000 mg | Freq: Once | INTRAMUSCULAR | Status: AC
  Administered 2021-01-19: 200 mg via INTRAVENOUS
  Filled 2021-01-19 (×2): qty 200

## 2021-01-19 NOTE — Discharge Instructions (Addendum)
Please follow up with Dr. Fletcher Anon.  Call his office in the morning please and let him know that you are in the emergency room with chest pain.  He should be out of see very quickly.  Please do not hesitate to return here if you have worsening pain fever shortness of breath or any other symptoms.

## 2021-01-19 NOTE — ED Provider Notes (Signed)
Patient's CT angio shows no PE.  Patient says the pain could be due to her cancer which she takes pain medications for or to her recent neck surgery.  It does feel somewhat like her chest pain she thinks but again she feels it could also be due to either one of the other causes.  Her EKG does not look significantly different over time and her troponins are negative.  I discussed her with Dr. Harrell Gave who is covering for Dr.Arida, she also checked on her old records.  We feel it is likely okay to let her go and follow-up with Dr. Fletcher Anon in the office very soon.  Usually we can arrange this for the next day.  Patient agrees with this and will return if she is any worse.   Nena Polio, MD 01/19/21 2011

## 2021-01-19 NOTE — ED Triage Notes (Signed)
Central chest pain, and squeezing pain her back/shoulder blades that woke her up from her sleep around 5am. Nausea. Dizzy. Weak.

## 2021-01-19 NOTE — ED Provider Notes (Signed)
[facility] Emergency Department Provider Note   ____________________________________________   Event Date/Time   First MD Initiated Contact with Patient 01/19/21 [phone]     (approximate)  I have reviewed the triage vital signs and the nursing notes.   HISTORY  Chief Complaint Chest Pain    HPI Renee Mack is a 55 y.o. female with past medical history of hyperlipidemia, diabetes, CAD, systolic CHF, COPD, GERD, vulvar cancer, and generalized anxiety disorder who presents to the ED complaining of chest pain.  Patient reports that she was woken from sleep by a feeling of tightness in her chest around 4 AM this morning.  Pain has been constant since then and seems to radiate towards her back.  Pain is not exacerbated or alleviated by anything in particular, she has taken her usual pain medication for vulvar cancer with no relief.  She denies any associated fevers, cough, shortness of breath, numbness, weakness, vomiting, or diarrhea.        Past Medical History:  Diagnosis Date   Cancer ([place])    vulvular   Cervical disc disease    a. 01/2018 MRI Cervical spine: Cervical spondylosis with multilevel disc and facet degeneration greatest at C5-6 and C6-7.  Moderate to sev R C5-6, mod L C5-6, and mod bilat C6-7 foraminal stenosis with multilevel mild foraminal stenosis.  Mild C5-6 and mod C6-7 canal stenosis. C6-7 central cord impingement w/ cord flattening.   Chronic combined systolic (congestive) and diastolic (congestive) heart failure ([place])    a. 12/2017 Echo: EF 30-35%, mid-apicalanteroseptal, ant, and apical AK. Gr1 DD. Mild conc LVH; b. 03/2018 Echo: EF 45-50%, antsept, ant HK. Mild MR. Nl LA size. Nl RV fxn.   COPD (chronic obstructive pulmonary disease) (HCC)    not on home oxygen   Coronary artery disease    a. 12/2017 ACS/PCI: LAD 100p (2.25x26 Resolute Onyx DES), 33m(2.0x12 Resolute Onyx DES), 80d (2.0x15 Resolute Onyx DES), RCA 90p (non-dominant). EF  25-35%. Post-MI course complicated by CGS; b. [id number]/subacute thrombosis-->LAD 100 (PTCA + DES x 1); c. 01/2018 NSTEMI/PCI: LM min irregs, LAD 50-60p ISR/hazy (3.5x12 Resolute DES), 669m (underexpansion of prior stents-->PTCA). EF 45-50%.   GERD (gastroesophageal reflux disease)    H/O degenerative disc disease    Hypotension    Ischemic cardiomyopathy    a. 12/2017 Echo: EF 30-35%; b. 03/2018 Echo: EF 45-50%.   Myocardial infarction ([place]   a. 12/2017-->DES to LAD x 3.   Pancreatitis    Shingles    Spinal stenosis    Tobacco abuse    Ulcer (traumatic) of oral mucosa    Urinary incontinence    VIN III (vulvar intraepithelial neoplasia III)     Patient Active Problem List   Diagnosis Date Noted   Malnutrition of moderate degree 11/12/2020   Spinal stenosis of cervical region 11/06/2020   Diabetes mellitus without complication ([facility] [id number] Hemoptysis 11/06/2020   Dilated bile duct 11/06/2020   Left arm weakness    Intractable vomiting with nausea 10/10/2020   Diabetic ketoacidosis ([facility]/03/2020   DKA (diabetic ketoacidosis) ([facility]/03/2020   Vulvar cancer ([facility] [place]02/10/2019   Palliative care patient 09/04/2018   Hyperglycemia 05/28/2018   Vulvar intraepithelial neoplasia (VIN) grade 3 05/24/2018   History of ST elevation myocardial infarction (STEMI) 02/27/2018   CAD (coronary artery disease) 02/27/2018   Hyperlipidemia associated with type 2 diabetes mellitus ([facility]09/02/2018   Anxiety associated with depression [id number] Chronic systolic heart failure (  [place])    Chest pain 12/26/2017   Unstable angina ([place])    Ischemic cardiomyopathy    Former smoker    Protein-calorie malnutrition, severe 07/18/2017   DM (diabetes mellitus) ([place]) 12/28/2014   Centrilobular emphysema ([place]) 12/28/2014   GERD (gastroesophageal reflux disease) 12/28/2014   History of shingles 12/28/2014   Hyperlipidemia LDL goal <70 12/28/2014    Past Surgical History:  Procedure  Laterality Date   ABDOMINAL HYSTERECTOMY     partial   ANTERIOR CERVICAL DECOMP/DISCECTOMY FUSION N/A 11/06/2020   Procedure: ANTERIOR CERVICAL DECOMPRESSION/DISCECTOMY FUSION 2 LEVELS C5-C7;  Surgeon: Meade Maw, MD;  Location: [facility] ORS;  Service: Neurosurgery;  Laterality: N/A;   CORONARY BALLOON ANGIOPLASTY N/A 05/22/2018   Procedure: CORONARY BALLOON ANGIOPLASTY;  Surgeon: Wellington Hampshire, MD;  Location: [place] [facility];  Service: Cardiovascular;  Laterality: N/A;   CORONARY STENT INTERVENTION N/A 12/26/2017   Procedure: CORONARY STENT INTERVENTION;  Surgeon: Wellington Hampshire, MD;  Location: [place] [facility];  Service: Cardiovascular;  Laterality: N/A;   CORONARY STENT INTERVENTION N/A 02/08/2018   Procedure: CORONARY STENT INTERVENTION;  Surgeon: Nelva Bush, MD;  Location: [place] [facility];  Service: Cardiovascular;  Laterality: N/A;   CORONARY/GRAFT ACUTE MI REVASCULARIZATION N/A 12/19/2017   Procedure: Coronary/Graft Acute MI Revascularization;  Surgeon: Wellington Hampshire, MD;  Location: [place] [facility];  Service: Cardiovascular;  Laterality: N/A;   ECTOPIC PREGNANCY SURGERY Left    INTRAVASCULAR ULTRASOUND/IVUS N/A 02/08/2018   Procedure: Intravascular Ultrasound/IVUS;  Surgeon: Nelva Bush, MD;  Location: [place] [facility];  Service: Cardiovascular;  Laterality: N/A;   LEFT HEART CATH AND CORONARY ANGIOGRAPHY N/A 12/19/2017   Procedure: LEFT HEART CATH AND CORONARY ANGIOGRAPHY;  Surgeon: Wellington Hampshire, MD;  Location: [place] [facility];  Service: Cardiovascular;  Laterality: N/A;   LEFT HEART CATH AND CORONARY ANGIOGRAPHY N/A 12/26/2017   Procedure: LEFT HEART CATH AND CORONARY ANGIOGRAPHY;  Surgeon: Wellington Hampshire, MD;  Location: [place] [facility];  Service: Cardiovascular;  Laterality: N/A;   LEFT HEART CATH AND CORONARY ANGIOGRAPHY N/A 02/08/2018   Procedure: LEFT HEART CATH AND CORONARY ANGIOGRAPHY;  Surgeon: Nelva Bush, MD;  Location:  [place] [facility];  Service: Cardiovascular;  Laterality: N/A;   LEFT HEART CATH AND CORONARY ANGIOGRAPHY N/A 05/22/2018   Procedure: LEFT HEART CATH AND CORONARY ANGIOGRAPHY;  Surgeon: Wellington Hampshire, MD;  Location: [place] [facility];  Service: Cardiovascular;  Laterality: N/A;   LEFT HEART CATH AND CORONARY ANGIOGRAPHY N/A 06/15/2018   Procedure: LEFT HEART CATH AND CORONARY ANGIOGRAPHY;  Surgeon: Wellington Hampshire, MD;  Location: [place] [facility];  Service: Cardiovascular;  Laterality: N/A;   LYMPHADENECTOMY     SPLENECTOMY, PARTIAL     vulvulasectomy      Prior to Admission medications   Medication Sig Start Date End Date Taking? Authorizing Provider  albuterol (PROAIR HFA) 108 (90 Base) MCG/ACT inhaler Inhale 1 puff into the lungs every 6 (six) hours as needed for wheezing or shortness of breath.    Yes [provider]  amitriptyline (ELAVIL) 75 MG tablet Take 75 mg by mouth at bedtime. 11/28/20  Yes [provider]  amLODipine (NORVASC) 5 MG tablet Take 1 tablet (5 mg total) by mouth daily. 11/14/20  Yes Bonnielee Haff, MD  aspirin 81 MG tablet Take 81 mg by mouth daily.   Yes [provider]  diazepam (VALIUM) 5 MG tablet Take 2.5-5 mg by mouth every 8 (eight) hours  as needed. 12/31/20  Yes [provider]  esomeprazole (NEXIUM) 40 MG capsule Take 1 capsule (40 mg total) by mouth daily before breakfast. 12/26/20  Yes Karamalegos, Devonne Doughty, DO  famotidine (PEPCID) 20 MG tablet Take 1 tablet (20 mg total) by mouth 2 (two) times daily. 12/26/20  Yes Karamalegos, Devonne Doughty, DO  insulin isophane & regular human (NOVOLIN 70/30 FLEXPEN) (70-30) 100 UNIT/ML KwikPen Inject 18 Units into the skin 2 (two) times daily. 04/05/20  Yes Shahmehdi, Seyed A, MD  Lidocaine HCl 4 % GEL Apply topically. 02/05/20  Yes [provider]  methocarbamol (ROBAXIN) 500 MG tablet Take 1 tablet by mouth 4 (four) times daily as needed. 08/01/20  Yes [provider]  mirtazapine (REMERON) 15 MG tablet Take 15 mg by mouth at bedtime. 07/07/18  Yes [provider]  OLANZapine (ZYPREXA) 5 MG tablet Take by mouth. 01/08/21 02/07/21 Yes [provider]  ondansetron (ZOFRAN ODT) 4 MG disintegrating tablet Take 1 tablet (4 mg total) by mouth every 8 (eight) hours as needed for nausea or vomiting. 12/24/20  Yes Lucrezia Starch, MD  oxyCODONE ER [facility] ER) 36 MG C12A Take 2 capsules by mouth every 8 (eight) hours.   Yes [provider]  polyethylene glycol (MIRALAX / GLYCOLAX) 17 g packet Take 17 g by mouth daily. 11/14/20  Yes Bonnielee Haff, MD  sucralfate (CARAFATE) 1 g tablet Take 1 g by mouth 4 (four) times daily. 03/01/20  Yes [provider]  amitriptyline (ELAVIL) 25 MG tablet Take 1 tablet (25 mg total) by mouth at bedtime. Patient taking differently: Take 75 mg by mouth at bedtime. 04/05/20 05/05/20  Deatra James, MD  atorvastatin (LIPITOR) 80 MG tablet Take 1 tablet (80 mg total) by mouth daily at 6 PM. 03/05/19 03/30/20  Theora Gianotti, NP  Blood Glucose Monitoring Suppl (GLUCOCOM BLOOD GLUCOSE MONITOR) DEVI 1 m. 11/15/17   Karamalegos, Devonne Doughty, DO  clonazePAM (KLONOPIN) 1 MG tablet Take 1 mg by mouth 3 (three) times daily as needed. Patient not taking: Reported on 01/19/2021 03/19/20   [provider]  Lancet Devices (SIMPLE DIAGNOSTICS LANCING DEV) MISC AS DIRECTED 11/15/17   Parks Ranger, Devonne Doughty, DO  nitroGLYCERIN (NITROSTAT) 0.4 MG SL tablet Place 1 tablet (0.4 mg total) under the tongue every 5 (five) minutes as needed for chest pain. 06/16/18   Nicholes Mango, MD  oxyCODONE (OXYCONTIN) 80 mg 12 hr tablet Take 1 tablet by mouth every 8 (eight) hours. Patient not taking: Reported on 12/09/2020    [provider]  oxycodone (ROXICODONE) 30 MG immediate release tablet Take 30 mg by mouth every 3 (three) hours as needed. Patient not taking: Reported on 01/19/2021 04/14/20    [provider]  ranolazine (RANEXA) 500 MG 12 hr tablet Take 1 tablet (500 mg total) by mouth 2 (two) times daily. 04/05/20 05/05/20  Deatra James, MD  SSD 1 % cream SMARTSIG:1 Topical 4-5 Times Daily 01/25/20   [provider]  ticagrelor (BRILINTA) 90 MG TABS tablet Take 1 tablet (90 mg total) by mouth 2 (two) times daily. *NEEDS OFFICE VISIT FOR FURTHER REFILLS-PLEASE CALL [phone] TO SCHEDULE.* Patient not taking: Reported on 01/19/2021 05/14/19   Theora Gianotti, NP    Allergies Bee venom, Metformin and related, Darvon [propoxyphene], Gabapentin, Nsaids, Tramadol, and Contrast media [iodinated diagnostic agents]  Family History  Problem Relation Age of Onset   Non-Hodgkin's lymphoma Mother    Osteoporosis Mother    Lupus  Sister    Heart disease Brother    Heart attack Brother    Heart attack Father     Social History Social History   Tobacco Use   Smoking status: Former    Packs/day: 0.50    Years: 30.00    Pack years: 15.00    Types: Cigarettes    Quit date: 12/19/2017    Years since quitting: 3.0   Smokeless tobacco: Former   Tobacco comments:    quit 12/19/2017.  Vaping Use   Vaping Use: Never used  Substance Use Topics   Alcohol use: No   Drug use: Yes    Types: Oxycodone    Review of Systems  Constitutional: No fever/chills Eyes: No visual changes. ENT: No sore throat. Cardiovascular: Positive for chest pain. Respiratory: Denies shortness of breath. Gastrointestinal: No abdominal pain.  No nausea, no vomiting.  No diarrhea.  No constipation. Genitourinary: Negative for dysuria. Musculoskeletal: Positive for back pain. Skin: Negative for rash. Neurological: Negative for headaches, focal weakness or numbness.  ____________________________________________   PHYSICAL EXAM:  VITAL SIGNS: ED Triage Vitals  Enc Vitals Group     BP 01/19/21 0640 122/64     Pulse Rate 01/19/21 0640 73     Resp 01/19/21 0640 18     Temp  01/19/21 0640 98.1 F (36.7 C)     Temp Source 01/19/21 0640 Oral     SpO2 01/19/21 0640 100 %     Weight 01/19/21 0638 129 lb (58.5 kg)     Height 01/19/21 0638 '5\' 1"'$  (1.549 m)     Head Circumference --      Peak Flow --      Pain Score 01/19/21 0638 6     Pain Loc --      Pain Edu? --      Excl. in [place]? --     Constitutional: Alert and oriented. Eyes: Conjunctivae are normal. Head: Atraumatic. Nose: No congestion/rhinnorhea. Mouth/Throat: Mucous membranes are moist. Neck: Normal ROM Cardiovascular: Normal rate, regular rhythm. Grossly normal heart sounds.  2+ radial and DP pulses bilaterally. Respiratory: Normal respiratory effort.  No retractions. Lungs CTAB.  Central chest wall tenderness to palpation which reproduces patient's pain. Gastrointestinal: Soft and nontender. No distention. Genitourinary: deferred Musculoskeletal: No lower extremity tenderness, trace edema to knees bilaterally. Neurologic:  Normal speech and language. No gross focal neurologic deficits are appreciated. Skin:  Skin is warm, dry and intact. No rash noted. Psychiatric: Mood and affect are normal. Speech and behavior are normal.  ____________________________________________   LABS (all labs ordered are listed, but only abnormal results are displayed)  Labs Reviewed  BASIC METABOLIC PANEL - Abnormal; Notable for the following components:      Result Value   Glucose, Bld 225 (*)    All other components within normal limits  D-DIMER, QUANTITATIVE - Abnormal; Notable for the following components:   D-Dimer, Quant 1.75 (*)    All other components within normal limits  CBC  TROPONIN I (HIGH SENSITIVITY)  TROPONIN I (HIGH SENSITIVITY)   ____________________________________________  EKG  ED ECG REPORT I, Blake Divine, the attending physician, personally viewed and interpreted this ECG.   Date: 01/19/2021  EKG Time: 6:38  Rate: 76  Rhythm: normal sinus rhythm  Axis: LAD  Intervals:none   ST&T Change: None   PROCEDURES  Procedure(s) performed (including Critical Care):  Procedures   ____________________________________________   INITIAL IMPRESSION / ASSESSMENT AND PLAN / ED COURSE      55 year old female  with past medical history of hyperlipidemia, diabetes, CAD, systolic CHF, COPD, GERD, vulvar cancer on chronic pain medication, and generalized anxiety disorder who presents to the ED complaining of acute onset pain in the center of her chest radiating towards her back.  EKG shows no evidence of arrhythmia or ischemia, initial troponin is negative and overall suspicion for ACS is low but we will send second set troponin.  Additional labs are unremarkable, chest x-ray reviewed by me and shows no infiltrate, edema, or effusion.  With pain radiating towards her back, we will need to rule out dissection.  Patient appears overall low risk for dissection given she has no specific risk factors, pulse deficit, or vital sign abnormality.  We will check D-dimer, further assess with CT scan if this is positive.  D-dimer noted to be elevated, we will further assess with CTA to rule out dissection.  Patient unfortunately has an allergy to IV contrast, however she has tolerated premedication in the past.  We will give a dose of IV steroids followed by IV Benadryl in a few hours.  Patient turned over to oncoming provider pending CTA results, if these are unremarkable she would be appropriate for discharge home with outpatient follow-up.      ____________________________________________   FINAL CLINICAL IMPRESSION(S) / ED DIAGNOSES  Final diagnoses:  Nonspecific chest pain     ED Discharge Orders     None        Note:  This document was prepared using Dragon voice recognition software and may include unintentional dictation errors.    Blake Divine, MD 01/19/21 [phone]

## 2021-01-21 ENCOUNTER — Telehealth: Payer: Self-pay

## 2021-01-21 NOTE — Telephone Encounter (Signed)
Left message to schedule ED follow up appointment, patient has been seen in 2020 by Dr. Fletcher Anon

## 2021-01-21 NOTE — Telephone Encounter (Signed)
-----   Message from Buford Dresser, MD sent at 01/20/2021  9:13 AM EDT ----- Regarding: ER follow up This patient was seen in the ER yesterday. She had nonspecific pain, unremarkable Tn but has history of CAD. She should be seen in the office for follow up in the near future for further evaluation. Thanks.

## 2021-01-28 ENCOUNTER — Telehealth: Payer: Self-pay

## 2021-01-28 NOTE — Telephone Encounter (Signed)
Left message on vm to call and schedule emergency department follow up

## 2021-01-28 NOTE — Telephone Encounter (Signed)
-----   Message from Buford Dresser, MD sent at 01/20/2021  9:13 AM EDT ----- Regarding: ER follow up This patient was seen in the ER yesterday. She had nonspecific pain, unremarkable Tn but has history of CAD. She should be seen in the office for follow up in the near future for further evaluation. Thanks.

## 2021-01-30 DIAGNOSIS — Z4789 Encounter for other orthopedic aftercare: Secondary | ICD-10-CM | POA: Diagnosis not present

## 2021-01-30 DIAGNOSIS — Z7984 Long term (current) use of oral hypoglycemic drugs: Secondary | ICD-10-CM | POA: Diagnosis not present

## 2021-01-30 DIAGNOSIS — I2510 Atherosclerotic heart disease of native coronary artery without angina pectoris: Secondary | ICD-10-CM | POA: Diagnosis not present

## 2021-01-30 DIAGNOSIS — Z87891 Personal history of nicotine dependence: Secondary | ICD-10-CM | POA: Diagnosis not present

## 2021-01-30 DIAGNOSIS — I5022 Chronic systolic (congestive) heart failure: Secondary | ICD-10-CM | POA: Diagnosis not present

## 2021-01-30 DIAGNOSIS — J439 Emphysema, unspecified: Secondary | ICD-10-CM | POA: Diagnosis not present

## 2021-01-30 DIAGNOSIS — Z794 Long term (current) use of insulin: Secondary | ICD-10-CM | POA: Diagnosis not present

## 2021-01-30 DIAGNOSIS — E119 Type 2 diabetes mellitus without complications: Secondary | ICD-10-CM | POA: Diagnosis not present

## 2021-01-30 DIAGNOSIS — R531 Weakness: Secondary | ICD-10-CM | POA: Diagnosis not present

## 2021-01-30 DIAGNOSIS — K838 Other specified diseases of biliary tract: Secondary | ICD-10-CM | POA: Diagnosis not present

## 2021-02-05 DIAGNOSIS — E119 Type 2 diabetes mellitus without complications: Secondary | ICD-10-CM | POA: Diagnosis not present

## 2021-02-05 DIAGNOSIS — Z87891 Personal history of nicotine dependence: Secondary | ICD-10-CM | POA: Diagnosis not present

## 2021-02-05 DIAGNOSIS — Z794 Long term (current) use of insulin: Secondary | ICD-10-CM | POA: Diagnosis not present

## 2021-02-05 DIAGNOSIS — Z4789 Encounter for other orthopedic aftercare: Secondary | ICD-10-CM | POA: Diagnosis not present

## 2021-02-05 DIAGNOSIS — K838 Other specified diseases of biliary tract: Secondary | ICD-10-CM | POA: Diagnosis not present

## 2021-02-05 DIAGNOSIS — Z7984 Long term (current) use of oral hypoglycemic drugs: Secondary | ICD-10-CM | POA: Diagnosis not present

## 2021-02-05 DIAGNOSIS — I5022 Chronic systolic (congestive) heart failure: Secondary | ICD-10-CM | POA: Diagnosis not present

## 2021-02-05 DIAGNOSIS — J439 Emphysema, unspecified: Secondary | ICD-10-CM | POA: Diagnosis not present

## 2021-02-05 DIAGNOSIS — R531 Weakness: Secondary | ICD-10-CM | POA: Diagnosis not present

## 2021-02-05 DIAGNOSIS — I2510 Atherosclerotic heart disease of native coronary artery without angina pectoris: Secondary | ICD-10-CM | POA: Diagnosis not present

## 2021-02-06 ENCOUNTER — Emergency Department: Payer: Medicare Other

## 2021-02-06 ENCOUNTER — Other Ambulatory Visit: Payer: Self-pay

## 2021-02-06 ENCOUNTER — Emergency Department
Admission: EM | Admit: 2021-02-06 | Discharge: 2021-02-06 | Disposition: A | Discharge status: Home / Self Care | Payer: Medicare Other | Attending: Emergency Medicine | Admitting: Emergency Medicine

## 2021-02-06 ENCOUNTER — Encounter: Payer: Self-pay | Admitting: *Deleted

## 2021-02-06 DIAGNOSIS — K6289 Other specified diseases of anus and rectum: Secondary | ICD-10-CM | POA: Diagnosis not present

## 2021-02-06 DIAGNOSIS — R197 Diarrhea, unspecified: Secondary | ICD-10-CM | POA: Diagnosis not present

## 2021-02-06 DIAGNOSIS — Z794 Long term (current) use of insulin: Secondary | ICD-10-CM | POA: Insufficient documentation

## 2021-02-06 DIAGNOSIS — J189 Pneumonia, unspecified organism: Secondary | ICD-10-CM | POA: Diagnosis not present

## 2021-02-06 DIAGNOSIS — Z8544 Personal history of malignant neoplasm of other female genital organs: Secondary | ICD-10-CM | POA: Insufficient documentation

## 2021-02-06 DIAGNOSIS — Z79899 Other long term (current) drug therapy: Secondary | ICD-10-CM | POA: Insufficient documentation

## 2021-02-06 DIAGNOSIS — E1169 Type 2 diabetes mellitus with other specified complication: Secondary | ICD-10-CM | POA: Diagnosis not present

## 2021-02-06 DIAGNOSIS — R109 Unspecified abdominal pain: Secondary | ICD-10-CM | POA: Diagnosis not present

## 2021-02-06 DIAGNOSIS — R059 Cough, unspecified: Secondary | ICD-10-CM

## 2021-02-06 DIAGNOSIS — R739 Hyperglycemia, unspecified: Secondary | ICD-10-CM

## 2021-02-06 DIAGNOSIS — Z87891 Personal history of nicotine dependence: Secondary | ICD-10-CM | POA: Insufficient documentation

## 2021-02-06 DIAGNOSIS — I110 Hypertensive heart disease with heart failure: Secondary | ICD-10-CM | POA: Insufficient documentation

## 2021-02-06 DIAGNOSIS — R112 Nausea with vomiting, unspecified: Secondary | ICD-10-CM | POA: Diagnosis not present

## 2021-02-06 DIAGNOSIS — Z20822 Contact with and (suspected) exposure to covid-19: Secondary | ICD-10-CM | POA: Diagnosis not present

## 2021-02-06 DIAGNOSIS — E1165 Type 2 diabetes mellitus with hyperglycemia: Secondary | ICD-10-CM | POA: Insufficient documentation

## 2021-02-06 DIAGNOSIS — I10 Essential (primary) hypertension: Secondary | ICD-10-CM | POA: Diagnosis not present

## 2021-02-06 DIAGNOSIS — Z7982 Long term (current) use of aspirin: Secondary | ICD-10-CM | POA: Insufficient documentation

## 2021-02-06 DIAGNOSIS — J439 Emphysema, unspecified: Secondary | ICD-10-CM | POA: Diagnosis not present

## 2021-02-06 DIAGNOSIS — I5042 Chronic combined systolic (congestive) and diastolic (congestive) heart failure: Secondary | ICD-10-CM | POA: Insufficient documentation

## 2021-02-06 DIAGNOSIS — J449 Chronic obstructive pulmonary disease, unspecified: Secondary | ICD-10-CM | POA: Diagnosis not present

## 2021-02-06 DIAGNOSIS — E785 Hyperlipidemia, unspecified: Secondary | ICD-10-CM | POA: Diagnosis not present

## 2021-02-06 DIAGNOSIS — I2510 Atherosclerotic heart disease of native coronary artery without angina pectoris: Secondary | ICD-10-CM | POA: Insufficient documentation

## 2021-02-06 DIAGNOSIS — K429 Umbilical hernia without obstruction or gangrene: Secondary | ICD-10-CM | POA: Diagnosis not present

## 2021-02-06 DIAGNOSIS — R1110 Vomiting, unspecified: Secondary | ICD-10-CM | POA: Diagnosis not present

## 2021-02-06 LAB — BLOOD GAS, VENOUS
Acid-base deficit: 0.5 mmol/L (ref 0.0–2.0)
Bicarbonate: 25.9 mmol/L (ref 20.0–28.0)
O2 Saturation: 54.9 %
Patient temperature: 37
pCO2, Ven: 48 mmHg (ref 44.0–60.0)
pH, Ven: 7.34 (ref 7.250–7.430)
pO2, Ven: 31 mmHg — CL (ref 32.0–45.0)

## 2021-02-06 LAB — CBG MONITORING, ED
Glucose-Capillary: 464 mg/dL — ABNORMAL HIGH (ref 70–99)
Glucose-Capillary: 357 mg/dL — ABNORMAL HIGH (ref 70–99)
Glucose-Capillary: 304 mg/dL — ABNORMAL HIGH (ref 70–99)

## 2021-02-06 LAB — HEMOGLOBIN A1C
Hgb A1c MFr Bld: 10.2 % — ABNORMAL HIGH (ref 4.8–5.6)
Mean Plasma Glucose: 246.04 mg/dL

## 2021-02-06 LAB — HEPATIC FUNCTION PANEL
ALT: 20 U/L (ref 0–44)
AST: 14 U/L — ABNORMAL LOW (ref 15–41)
Albumin: 3.8 g/dL (ref 3.5–5.0)
Alkaline Phosphatase: 213 U/L — ABNORMAL HIGH (ref 38–126)
Bilirubin, Direct: <0.1 mg/dL (ref 0.0–0.2)
Total Bilirubin: 0.5 mg/dL (ref 0.3–1.2)
Total Protein: 8.3 g/dL — ABNORMAL HIGH (ref 6.5–8.1)

## 2021-02-06 LAB — CBC
HCT: 44.6 % (ref 36.0–46.0)
Hemoglobin: 15 g/dL (ref 12.0–15.0)
MCH: 31.7 pg (ref 26.0–34.0)
MCHC: 33.6 g/dL (ref 30.0–36.0)
MCV: 94.3 fL (ref 80.0–100.0)
Platelets: 332 10*3/uL (ref 150–400)
RBC: 4.73 MIL/uL (ref 3.87–5.11)
RDW: 14.1 % (ref 11.5–15.5)
WBC: 8.8 10*3/uL (ref 4.0–10.5)
nRBC: 0 % (ref 0.0–0.2)

## 2021-02-06 LAB — RESP PANEL BY RT-PCR (FLU A&B, COVID) ARPGX2
Influenza A by PCR: NEGATIVE
Influenza B by PCR: NEGATIVE
SARS Coronavirus 2 by RT PCR: NEGATIVE

## 2021-02-06 LAB — URINALYSIS, COMPLETE (UACMP) WITH MICROSCOPIC
Bilirubin Urine: NEGATIVE
Glucose, UA: >=500 mg/dL — AB
Ketones, ur: 5 mg/dL — AB
Nitrite: NEGATIVE
Protein, ur: NEGATIVE mg/dL
Specific Gravity, Urine: 1.025 (ref 1.005–1.030)
pH: 6 (ref 5.0–8.0)

## 2021-02-06 LAB — BASIC METABOLIC PANEL
Anion gap: 10 (ref 5–15)
BUN: 12 mg/dL (ref 6–20)
CO2: 23 mmol/L (ref 22–32)
Calcium: 8.9 mg/dL (ref 8.9–10.3)
Chloride: 101 mmol/L (ref 98–111)
Creatinine, Ser: 0.6 mg/dL (ref 0.44–1.00)
GFR, Estimated: >60 mL/min (ref >60)
Glucose, Bld: 404 mg/dL — ABNORMAL HIGH (ref 70–99)
Potassium: 4 mmol/L (ref 3.5–5.1)
Sodium: 134 mmol/L — ABNORMAL LOW (ref 135–145)

## 2021-02-06 LAB — LIPASE, BLOOD: Lipase: 18 U/L (ref 11–51)

## 2021-02-06 LAB — BETA-HYDROXYBUTYRIC ACID: Beta-Hydroxybutyric Acid: 0.43 mmol/L — ABNORMAL HIGH (ref 0.05–0.27)

## 2021-02-06 MED ORDER — INSULIN ASPART 100 UNIT/ML IJ SOLN
0.0000 [IU] | INTRAMUSCULAR | Status: DC
  Administered 2021-02-06: 15 [IU] via SUBCUTANEOUS
  Filled 2021-02-06: qty 1

## 2021-02-06 MED ORDER — ONDANSETRON HCL 4 MG/2ML IJ SOLN: 4.0000 mg | Freq: Once | INTRAMUSCULAR | Status: AC

## 2021-02-06 MED ORDER — SODIUM CHLORIDE 0.9 % IV BOLUS
1000.0000 mL | Freq: Once | INTRAVENOUS | Status: AC
  Administered 2021-02-06: 1000 mL via INTRAVENOUS

## 2021-02-06 MED ORDER — OXYCODONE HCL 5 MG PO TABS
5.0000 mg | ORAL_TABLET | ORAL | Status: AC
  Administered 2021-02-06: 5 mg via ORAL
  Filled 2021-02-06: qty 1

## 2021-02-06 MED ORDER — HYDROMORPHONE HCL 1 MG/ML IJ SOLN
1.0000 mg | Freq: Once | INTRAMUSCULAR | Status: AC
  Administered 2021-02-06: 1 mg via INTRAVENOUS
  Filled 2021-02-06: qty 1

## 2021-02-06 MED ORDER — ONDANSETRON HCL 4 MG/2ML IJ SOLN
INTRAMUSCULAR | Status: AC
  Administered 2021-02-06: 4 mg via INTRAVENOUS
  Filled 2021-02-06: qty 2

## 2021-02-06 MED ORDER — LACTATED RINGERS IV BOLUS
1000.0000 mL | Freq: Once | INTRAVENOUS | Status: AC
  Administered 2021-02-06: 1000 mL via INTRAVENOUS

## 2021-02-06 MED ORDER — METOCLOPRAMIDE HCL 5 MG/ML IJ SOLN
10.0000 mg | Freq: Once | INTRAMUSCULAR | Status: AC
  Administered 2021-02-06: 10 mg via INTRAVENOUS
  Filled 2021-02-06: qty 2

## 2021-02-06 MED ORDER — DOXYCYCLINE HYCLATE 100 MG PO CAPS: 100.0000 mg | ORAL_CAPSULE | Freq: Two times a day (BID) | ORAL | 0 refills | Status: AC

## 2021-02-06 NOTE — ED Provider Notes (Signed)
[facility] Emergency Department Provider Note  ____________________________________________   Event Date/Time   First MD Initiated Contact with Patient 02/06/21 1818     (approximate)  I have reviewed the triage vital signs and the nursing notes.   HISTORY  Chief Complaint Hyperglycemia and Emesis   HPI Renee Mack is a 55 y.o. female past medical history of DM, HTN, h, CAD, systolic CHF, COPD, GERD, vulvar cancer, and generalized anxiety disorder currently on palliative care not pursuing any additional treatment of her cancer who presents for assessment approximately 2 days of some intractable nausea and vomiting and fairly significant anal abdominal pain that she states is typical of her usual pain but she has not been able to keep down any of her oxycodones.  She endorses a little bit of diarrhea but has not had any significant constipation or urinary symptoms.  Also endorses mild nonproductive cough. No new chest pain, shortness of breath, earache, sore throat, rash or recent injuries or falls.  She states her sugars have been fairly high if she is not able to keep anything down her insulin is not working typically does not.  She denies any acute concerns at this time and states she prefers to go home if she can get her nausea and pain under control.         Past Medical History:  Diagnosis Date   Cancer ([place])    vulvular   Cervical disc disease    a. 01/2018 MRI Cervical spine: Cervical spondylosis with multilevel disc and facet degeneration greatest at C5-6 and C6-7.  Moderate to sev R C5-6, mod L C5-6, and mod bilat C6-7 foraminal stenosis with multilevel mild foraminal stenosis.  Mild C5-6 and mod C6-7 canal stenosis. C6-7 central cord impingement w/ cord flattening.   Chronic combined systolic (congestive) and diastolic (congestive) heart failure ([place])    a. 12/2017 Echo: EF 30-35%, mid-apicalanteroseptal, ant, and apical AK. Gr1 DD. Mild conc LVH;  b. 03/2018 Echo: EF 45-50%, antsept, ant HK. Mild MR. Nl LA size. Nl RV fxn.   COPD (chronic obstructive pulmonary disease) (HCC)    not on home oxygen   Coronary artery disease    a. 12/2017 ACS/PCI: LAD 100p (2.25x26 Resolute Onyx DES), 57m(2.0x12 Resolute Onyx DES), 80d (2.0x15 Resolute Onyx DES), RCA 90p (non-dominant). EF 25-35%. Post-MI course complicated by CGS; b. 78/1275NSTEMI/subacute thrombosis-->LAD 100 (PTCA + DES x 1); c. 01/2018 NSTEMI/PCI: LM min irregs, LAD 50-60p ISR/hazy (3.5x12 Resolute DES), 670m (underexpansion of prior stents-->PTCA). EF 45-50%.   GERD (gastroesophageal reflux disease)    H/O degenerative disc disease    Hypotension    Ischemic cardiomyopathy    a. 12/2017 Echo: EF 30-35%; b. 03/2018 Echo: EF 45-50%.   Myocardial infarction ([place]   a. 12/2017-->DES to LAD x 3.   Pancreatitis    Shingles    Spinal stenosis    Tobacco abuse    Ulcer (traumatic) of oral mucosa    Urinary incontinence    VIN III (vulvar intraepithelial neoplasia III)     Patient Active Problem List   Diagnosis Date Noted   Malnutrition of moderate degree 11/12/2020   Spinal stenosis of cervical region 11/06/2020   Diabetes mellitus without complication ([place]0517/00/1749 Hemoptysis 11/06/2020   Dilated bile duct 11/06/2020   Left arm weakness    Intractable vomiting with nausea 10/10/2020   Diabetic ketoacidosis ([facility]10/03/2020   DKA (diabetic ketoacidosis) ([facility]10/03/2020   Vulvar cancer ([facility]  07/27/2019   Palliative care patient 09/04/2018   Hyperglycemia 05/28/2018   Vulvar intraepithelial neoplasia (VIN) grade 3 05/24/2018   History of ST elevation myocardial infarction (STEMI) 02/27/2018   CAD (coronary artery disease) 02/27/2018   Hyperlipidemia associated with type 2 diabetes mellitus ([place]) 02/27/2018   Anxiety associated with depression 37/03/6268   Chronic systolic heart failure ([place])    Chest pain 12/26/2017   Unstable angina ([place])    Ischemic cardiomyopathy     Former smoker    Protein-calorie malnutrition, severe 07/18/2017   DM (diabetes mellitus) ([place]) 12/28/2014   Centrilobular emphysema ([place]) 12/28/2014   GERD (gastroesophageal reflux disease) 12/28/2014   History of shingles 12/28/2014   Hyperlipidemia LDL goal <70 12/28/2014    Past Surgical History:  Procedure Laterality Date   ABDOMINAL HYSTERECTOMY     partial   ANTERIOR CERVICAL DECOMP/DISCECTOMY FUSION N/A 11/06/2020   Procedure: ANTERIOR CERVICAL DECOMPRESSION/DISCECTOMY FUSION 2 LEVELS C5-C7;  Surgeon: Meade Maw, MD;  Location: [facility] ORS;  Service: Neurosurgery;  Laterality: N/A;   CORONARY BALLOON ANGIOPLASTY N/A 05/22/2018   Procedure: CORONARY BALLOON ANGIOPLASTY;  Surgeon: Wellington Hampshire, MD;  Location: [place] [facility];  Service: Cardiovascular;  Laterality: N/A;   CORONARY STENT INTERVENTION N/A 12/26/2017   Procedure: CORONARY STENT INTERVENTION;  Surgeon: Wellington Hampshire, MD;  Location: [place] [facility];  Service: Cardiovascular;  Laterality: N/A;   CORONARY STENT INTERVENTION N/A 02/08/2018   Procedure: CORONARY STENT INTERVENTION;  Surgeon: Nelva Bush, MD;  Location: [place] [facility];  Service: Cardiovascular;  Laterality: N/A;   CORONARY/GRAFT ACUTE MI REVASCULARIZATION N/A 12/19/2017   Procedure: Coronary/Graft Acute MI Revascularization;  Surgeon: Wellington Hampshire, MD;  Location: [place] [facility];  Service: Cardiovascular;  Laterality: N/A;   ECTOPIC PREGNANCY SURGERY Left    INTRAVASCULAR ULTRASOUND/IVUS N/A 02/08/2018   Procedure: Intravascular Ultrasound/IVUS;  Surgeon: Nelva Bush, MD;  Location: [place] [facility];  Service: Cardiovascular;  Laterality: N/A;   LEFT HEART CATH AND CORONARY ANGIOGRAPHY N/A 12/19/2017   Procedure: LEFT HEART CATH AND CORONARY ANGIOGRAPHY;  Surgeon: Wellington Hampshire, MD;  Location: [place] [facility];  Service: Cardiovascular;  Laterality: N/A;   LEFT HEART CATH AND CORONARY ANGIOGRAPHY N/A  12/26/2017   Procedure: LEFT HEART CATH AND CORONARY ANGIOGRAPHY;  Surgeon: Wellington Hampshire, MD;  Location: [place] [facility];  Service: Cardiovascular;  Laterality: N/A;   LEFT HEART CATH AND CORONARY ANGIOGRAPHY N/A 02/08/2018   Procedure: LEFT HEART CATH AND CORONARY ANGIOGRAPHY;  Surgeon: Nelva Bush, MD;  Location: [place] [facility];  Service: Cardiovascular;  Laterality: N/A;   LEFT HEART CATH AND CORONARY ANGIOGRAPHY N/A 05/22/2018   Procedure: LEFT HEART CATH AND CORONARY ANGIOGRAPHY;  Surgeon: Wellington Hampshire, MD;  Location: [place] [facility];  Service: Cardiovascular;  Laterality: N/A;   LEFT HEART CATH AND CORONARY ANGIOGRAPHY N/A 06/15/2018   Procedure: LEFT HEART CATH AND CORONARY ANGIOGRAPHY;  Surgeon: Wellington Hampshire, MD;  Location: [place] [facility];  Service: Cardiovascular;  Laterality: N/A;   LYMPHADENECTOMY     SPLENECTOMY, PARTIAL     vulvulasectomy      Prior to Admission medications   Medication Sig Start Date End Date Taking? Authorizing Provider  doxycycline (VIBRAMYCIN) 100 MG capsule Take 1 capsule (100 mg total) by mouth 2 (two) times daily for 7 days. 02/06/21 02/13/21 Yes Lucrezia Starch, MD  albuterol (PROAIR HFA) 108 (90 Base) MCG/ACT inhaler Inhale 1 puff into the lungs every 6 (  six) hours as needed for wheezing or shortness of breath.     [provider]  amitriptyline (ELAVIL) 25 MG tablet Take 1 tablet (25 mg total) by mouth at bedtime. Patient taking differently: Take 75 mg by mouth at bedtime. 04/05/20 05/05/20  ShahmehdiValeria Batman, MD  amitriptyline (ELAVIL) 75 MG tablet Take 75 mg by mouth at bedtime. 11/28/20   [provider]  amLODipine (NORVASC) 5 MG tablet Take 1 tablet (5 mg total) by mouth daily. 11/14/20   Bonnielee Haff, MD  aspirin 81 MG tablet Take 81 mg by mouth daily.    [provider]  atorvastatin (LIPITOR) 80 MG tablet Take 1 tablet (80 mg total) by mouth daily at 6 PM. 03/05/19 03/30/20  Theora Gianotti, NP  Blood Glucose Monitoring Suppl (GLUCOCOM BLOOD GLUCOSE MONITOR) DEVI 1 m. 11/15/17   Karamalegos, Devonne Doughty, DO  clonazePAM (KLONOPIN) 1 MG tablet Take 1 mg by mouth 3 (three) times daily as needed. Patient not taking: Reported on 01/19/2021 03/19/20   [provider]  diazepam (VALIUM) 5 MG tablet Take 2.5-5 mg by mouth every 8 (eight) hours as needed. 12/31/20   [provider]  esomeprazole (NEXIUM) 40 MG capsule Take 1 capsule (40 mg total) by mouth daily before breakfast. 12/26/20   Parks Ranger, Devonne Doughty, DO  famotidine (PEPCID) 20 MG tablet Take 1 tablet (20 mg total) by mouth 2 (two) times daily. 12/26/20   Karamalegos, Devonne Doughty, DO  insulin isophane & regular human (NOVOLIN 70/30 FLEXPEN) (70-30) 100 UNIT/ML KwikPen Inject 18 Units into the skin 2 (two) times daily. 04/05/20   Deatra James, MD  Lancet Devices (SIMPLE DIAGNOSTICS LANCING DEV) MISC AS DIRECTED 11/15/17   Parks Ranger Devonne Doughty, DO  Lidocaine HCl 4 % GEL Apply topically. 02/05/20   [provider]  methocarbamol (ROBAXIN) 500 MG tablet Take 1 tablet by mouth 4 (four) times daily as needed. 08/01/20   [provider]  mirtazapine (REMERON) 15 MG tablet Take 15 mg by mouth at bedtime. 07/07/18   [provider]  nitroGLYCERIN (NITROSTAT) 0.4 MG SL tablet Place 1 tablet (0.4 mg total) under the tongue every 5 (five) minutes as needed for chest pain. 06/16/18   Nicholes Mango, MD  OLANZapine (ZYPREXA) 5 MG tablet Take by mouth. 01/08/21 02/07/21  [provider]  ondansetron (ZOFRAN ODT) 4 MG disintegrating tablet Take 1 tablet (4 mg total) by mouth every 8 (eight) hours as needed for nausea or vomiting. 12/24/20   Lucrezia Starch, MD  oxyCODONE (OXYCONTIN) 80 mg 12 hr tablet Take 1 tablet by mouth every 8 (eight) hours. Patient not taking: Reported on 12/09/2020    [provider]  oxycodone (ROXICODONE) 30 MG immediate release tablet Take 30 mg by  mouth every 3 (three) hours as needed. Patient not taking: Reported on 01/19/2021 04/14/20   [provider]  oxyCODONE ER (XTAMPZA ER) 36 MG C12A Take 2 capsules by mouth every 8 (eight) hours.    [provider]  polyethylene glycol (MIRALAX / GLYCOLAX) 17 g packet Take 17 g by mouth daily. 11/14/20   Bonnielee Haff, MD  ranolazine (RANEXA) 500 MG 12 hr tablet Take 1 tablet (500 mg total) by mouth 2 (two) times daily. 04/05/20 05/05/20  Deatra James, MD  SSD 1 % cream SMARTSIG:1 Topical 4-5 Times Daily 01/25/20   [provider]  sucralfate (CARAFATE) 1 g tablet Take 1 g by mouth 4 (four) times daily. 03/01/20  [provider]  ticagrelor (BRILINTA) 90 MG TABS tablet Take 1 tablet (90 mg total) by mouth 2 (two) times daily. *NEEDS OFFICE VISIT FOR FURTHER REFILLS-PLEASE CALL [phone] TO SCHEDULE.* Patient not taking: Reported on 01/19/2021 05/14/19   Theora Gianotti, NP    Allergies Bee venom, Metformin and related, Darvon [propoxyphene], Gabapentin, Nsaids, Tramadol, and Contrast media [iodinated diagnostic agents]  Family History  Problem Relation Age of Onset   Non-Hodgkin's lymphoma Mother    Osteoporosis Mother    Lupus Sister    Heart disease Brother    Heart attack Brother    Heart attack Father     Social History Social History   Tobacco Use   Smoking status: Former    Packs/day: 0.50    Years: 30.00    Pack years: 15.00    Types: Cigarettes    Quit date: 12/19/2017    Years since quitting: 3.1   Smokeless tobacco: Former   Tobacco comments:    quit 12/19/2017.  Vaping Use   Vaping Use: Never used  Substance Use Topics   Alcohol use: No   Drug use: Yes    Types: Oxycodone    Review of Systems  Review of Systems  Constitutional:  Positive for malaise/fatigue. Negative for chills and fever.  HENT:  Negative for sore throat.   Eyes:  Negative for pain.  Respiratory:  Positive for cough. Negative for stridor.    Cardiovascular:  Negative for chest pain.  Gastrointestinal:  Positive for abdominal pain, diarrhea, nausea and vomiting.  Genitourinary:  Negative for dysuria.  Musculoskeletal:  Negative for myalgias.  Skin:  Negative for rash.  Neurological:  Positive for weakness. Negative for seizures, loss of consciousness and headaches.  Psychiatric/Behavioral:  Negative for suicidal ideas.   All other systems reviewed and are negative.    ____________________________________________   PHYSICAL EXAM:  VITAL SIGNS: ED Triage Vitals  Enc Vitals Group     BP 02/06/21 1606 127/86     Pulse Rate 02/06/21 1606 76     Resp --      Temp 02/06/21 1604 98.5 F (36.9 C)     Temp src --      SpO2 02/06/21 1606 96 %     Weight 02/06/21 1610 128 lb 15.5 oz (58.5 kg)     Height 02/06/21 1610 _0  (1.549 m)     Head Circumference --      Peak Flow --      Pain Score 02/06/21 1609 10     Pain Loc --      Pain Edu? --      Excl. in [place]? --    Vitals:   02/06/21 1606 02/06/21 1906  BP: 127/86 129/81  Pulse: 76 89  Resp:  (!) 22  Temp: 98.5 F (36.9 C)   SpO2: 96% 99%   Physical Exam Vitals and nursing note reviewed.  Constitutional:      General: She is not in acute distress.    Appearance: She is well-developed. She is ill-appearing (chronically ill appearing).  HENT:     Head: Normocephalic and atraumatic.     Right Ear: External ear normal.     Left Ear: External ear normal.     Nose: Nose normal.     Mouth/Throat:     Mouth: Mucous membranes are dry.  Eyes:     Conjunctiva/sclera: Conjunctivae normal.  Cardiovascular:     Rate and Rhythm: Normal rate and regular rhythm.  Heart sounds: No murmur heard. Pulmonary:     Effort: Pulmonary effort is normal. No respiratory distress.     Breath sounds: Normal breath sounds.  Abdominal:     Palpations: Abdomen is soft.     Tenderness: There is abdominal tenderness. There is no right CVA tenderness or left CVA tenderness.   Musculoskeletal:     Cervical back: Neck supple.  Skin:    General: Skin is warm and dry.     Capillary Refill: Capillary refill takes more than 3 seconds.  Neurological:     Mental Status: She is alert and oriented to person, place, and time.  Psychiatric:        Mood and Affect: Mood normal.     ____________________________________________   LABS (all labs ordered are listed, but only abnormal results are displayed)  Labs Reviewed  URINALYSIS, COMPLETE (UACMP) WITH MICROSCOPIC - Abnormal; Notable for the following components:      Result Value   Color, Urine STRAW (*)    APPearance CLEAR (*)    Glucose, UA >=500 (*)    Hgb urine dipstick SMALL (*)    Ketones, ur 5 (*)    Leukocytes,Ua TRACE (*)    Bacteria, UA RARE (*)    All other components within normal limits  BASIC METABOLIC PANEL - Abnormal; Notable for the following components:   Sodium 134 (*)    Glucose, Bld 404 (*)    All other components within normal limits  BETA-HYDROXYBUTYRIC ACID - Abnormal; Notable for the following components:   Beta-Hydroxybutyric Acid 0.43 (*)    All other components within normal limits  BLOOD GAS, VENOUS - Abnormal; Notable for the following components:   pO2, Ven 31.0 (*)    All other components within normal limits  HEPATIC FUNCTION PANEL - Abnormal; Notable for the following components:   Total Protein 8.3 (*)    AST 14 (*)    Alkaline Phosphatase 213 (*)    All other components within normal limits  CBG MONITORING, ED - Abnormal; Notable for the following components:   Glucose-Capillary 464 (*)    All other components within normal limits  CBG MONITORING, ED - Abnormal; Notable for the following components:   Glucose-Capillary 357 (*)    All other components within normal limits  CBG MONITORING, ED - Abnormal; Notable for the following components:   Glucose-Capillary 304 (*)    All other components within normal limits  RESP PANEL BY RT-PCR (FLU A&B, COVID) ARPGX2  CBC   LIPASE, BLOOD  HEMOGLOBIN A1C   ____________________________________________  EKG  EG shows sinus rhythm with a ventricular to 68, normal axis, unremarkable intervals without clear evidence of acute ischemia or significant arrhythmia.  Ectopic P wave in lead III. ____________________________________________  RADIOLOGY  ED MD interpretation: Chest x-ray shows chronic emphysematous changes and bronchitic changes without clear focal consolidation, effusion, pneumothorax or other clear acute thoracic process.  CT on pelvis shows possible left lower lung opacity concerning for possible pneumonia.  There is some thickening of the peritoneum likely related to patient's for cancer without any evidence of abscess, gas or evidence of diverticulitis, SBO, pancreatitis, cholecystitis or other acute abdominopelvic process.   Official radiology report(s): CT ABDOMEN PELVIS WO CONTRAST  Result Date: 02/06/2021 CLINICAL DATA:  Acute abdominal pain. Nonlocalized. Hyperglycemia and vomiting for 2 days. Unable to keep medications down. EXAM: CT ABDOMEN AND PELVIS WITHOUT CONTRAST TECHNIQUE: Multidetector CT imaging of the abdomen and pelvis was performed following the standard protocol without  IV contrast. COMPARISON:  01/19/2021 FINDINGS: Lower chest: Focal patchy infiltration seen in the posterior left lower lung, possibly pneumonia. No pleural effusions. Aortic calcification. Hepatobiliary: No focal liver abnormality is seen. No gallstones, gallbladder wall thickening, or biliary dilatation. Pancreas: Unremarkable. No pancreatic ductal dilatation or surrounding inflammatory changes. Spleen: Normal in size without focal abnormality. Adrenals/Urinary Tract: Adrenal glands are unremarkable. Kidneys are normal, without renal calculi, focal lesion, or hydronephrosis. Bladder is unremarkable. Stomach/Bowel: Stomach, small bowel, and colon are not abnormally distended. Colon is diffusely stool-filled. No wall thickening  or inflammatory changes are appreciated. Appendix is normal. Vascular/Lymphatic: Diffuse calcification of the abdominal aorta. No aneurysm. No significant lymphadenopathy. Reproductive: Surgical absence of the uterus. No abnormal adnexal masses. Soft tissue thickening and irregularity in the perineum surrounding the anal region and vaginal introitus. This is unchanged since previous studies and likely corresponds to postoperative or post radiation changes. Residual malignancy could not be excluded. Other: No free air or free fluid in the abdomen. Minimal periumbilical hernias containing fat. Musculoskeletal: No destructive bone lesions. IMPRESSION: 1. Small focal patchy infiltration in the posterior left lower lung possibly representing focal pneumonia. 2. Soft tissue thickening in the perineum likely associated with post treatment changes of known vulvar cancer. No significant change in appearance since prior studies. 3. No evidence of bowel obstruction or inflammation. 4. Aortic atherosclerosis. Electronically Signed   By: Lucienne Capers M.D.   On: 02/06/2021 19:29   DG Chest Portable 1 View  Result Date: 02/06/2021 CLINICAL DATA:  Cough. Hyperglycemia and vomiting for 2 days. Patient is on cancer medications and palliative care medications. EXAM: PORTABLE CHEST 1 VIEW COMPARISON:  01/19/2021 FINDINGS: Heart size and pulmonary vascularity are normal. Emphysematous changes in the lungs. Streaky perihilar opacities probably representing chronic bronchitis. No superimposed consolidation or edema. No pleural effusions. No pneumothorax. Mediastinal contours appear intact. Calcification of the aorta. Postoperative changes in the cervical spine. IMPRESSION: Emphysematous and chronic bronchitic changes in the lungs. No focal consolidation. Electronically Signed   By: Lucienne Capers M.D.   On: 02/06/2021 19:23    ____________________________________________   PROCEDURES  Procedure(s) performed (including  Critical Care):  Procedures   ____________________________________________   INITIAL IMPRESSION / ASSESSMENT AND PLAN / ED COURSE      Patient presents with above to history exam for some vomiting diarrhea and crampy abdominal pain as well as mild nonproductive cough for the last couple days.  Patient is afebrile and hemodynamically stable arrival.  She has some mild tenderness to her abdomen but otherwise her lungs are clear bilaterally.  No significant CVA tenderness.  She is not peritoneal take and has no guarding.  Differential includes cholecystitis, acute infectious gastroenteritis, pancreatitis, cystitis, DKA versus other metabolic derangements.  She has no fever or abnormal breath sounds on auscultation no findings on chest x-ray to suggest bacterial pneumonia.  Suspect mild bronchitis  Chest x-ray shows chronic emphysematous changes and bronchitic changes without clear focal consolidation, effusion, pneumothorax or other clear acute thoracic process.  CT on pelvis shows possible left lower lung opacity concerning for possible pneumonia.  There is some thickening of the peritoneum likely related to patient's for cancer without any evidence of abscess, gas or evidence of diverticulitis, SBO, pancreatitis, cholecystitis or other acute abdominopelvic process.   CBC shows no leukocytosis or acute anemia.  BMP remarkable for hyperglycemia with a glucose of 404 without evidence of acidosis or other significant derangements.  Beta hydroxybutyrate is just above normal at 0.43.  VBG with a  pH of 7.34 and a PCO2 of 48 and a bicarb of 25.9.  Overall this does not fit a clinical picture of DKA more consistent with hydration and hyperglycemia.  [place] function panel has no evidence of hepatitis and some chronically elevated alk phos.  Lipase is not consistent with acute pancreatitis.  UA is not suggestive of cystitis.  On reassessment patient's pain and nausea is much better controlled.  Given she  states he strongly wishes to go home with otherwise reassuring exam and work-up her pain now well controlled as well as her nausea think this is reasonable.  Will write short course of Doxy to cover for possible pneumonia given cough and findings on CT of the lower lungs.  She states she has Zofran at home as well as pain medicines.  She will talk to her oncologist about changing her antiemetics.  Advised her to keep close track of her sugars and follow-up with her PCP.  Discharged stable condition.  Strict return precautions advised and discussed.      ____________________________________________   FINAL CLINICAL IMPRESSION(S) / ED DIAGNOSES  Final diagnoses:  Nausea vomiting and diarrhea  Hyperglycemia  Cough  Community acquired pneumonia of left lower lobe of lung    Medications  insulin aspart (novoLOG) injection 0-15 Units (15 Units Subcutaneous Given 02/06/21 1902)  oxyCODONE (Oxy IR/ROXICODONE) immediate release tablet 5 mg (has no administration in time range)  sodium chloride 0.9 % bolus 1,000 mL (0 mLs Intravenous Stopped 02/06/21 1903)  ondansetron (ZOFRAN) injection 4 mg (4 mg Intravenous Given 02/06/21 1711)  lactated ringers bolus 1,000 mL (1,000 mLs Intravenous New Bag/Given 02/06/21 1903)  HYDROmorphone (DILAUDID) injection 1 mg (1 mg Intravenous Given 02/06/21 1903)  metoCLOPramide (REGLAN) injection 10 mg (10 mg Intravenous Given 02/06/21 1905)  oxyCODONE (Oxy IR/ROXICODONE) immediate release tablet 5 mg (5 mg Oral Given 02/06/21 1947)     ED Discharge Orders          Ordered    doxycycline (VIBRAMYCIN) 100 MG capsule  2 times daily        02/06/21 1954             Note:  This document was prepared using Dragon voice recognition software and may include unintentional dictation errors.    Lucrezia Starch, MD 02/06/21 [phone]

## 2021-02-06 NOTE — ED Notes (Signed)
Attempted IV twice without success  

## 2021-02-06 NOTE — ED Triage Notes (Signed)
Pt to ED reporting hyperglycemia and vomiting x 2 days. Pt has not been able to keep cancer medications and palliative care medications down over the past two days. Pt usually uses Novolin at home but has been unable to control her blood sugars.

## 2021-02-08 ENCOUNTER — Other Ambulatory Visit: Payer: Self-pay

## 2021-02-08 ENCOUNTER — Emergency Department
Admission: EM | Admit: 2021-02-08 | Discharge: 2021-02-09 | Disposition: A | Discharge status: Home / Self Care | Payer: Medicare Other | Attending: Emergency Medicine | Admitting: Emergency Medicine

## 2021-02-08 ENCOUNTER — Encounter: Payer: Self-pay | Admitting: Emergency Medicine

## 2021-02-08 DIAGNOSIS — R197 Diarrhea, unspecified: Secondary | ICD-10-CM | POA: Diagnosis not present

## 2021-02-08 DIAGNOSIS — E1110 Type 2 diabetes mellitus with ketoacidosis without coma: Secondary | ICD-10-CM | POA: Diagnosis not present

## 2021-02-08 DIAGNOSIS — I110 Hypertensive heart disease with heart failure: Secondary | ICD-10-CM | POA: Insufficient documentation

## 2021-02-08 DIAGNOSIS — I5042 Chronic combined systolic (congestive) and diastolic (congestive) heart failure: Secondary | ICD-10-CM | POA: Insufficient documentation

## 2021-02-08 DIAGNOSIS — Z87891 Personal history of nicotine dependence: Secondary | ICD-10-CM | POA: Diagnosis not present

## 2021-02-08 DIAGNOSIS — Z8544 Personal history of malignant neoplasm of other female genital organs: Secondary | ICD-10-CM | POA: Insufficient documentation

## 2021-02-08 DIAGNOSIS — Z955 Presence of coronary angioplasty implant and graft: Secondary | ICD-10-CM | POA: Diagnosis not present

## 2021-02-08 DIAGNOSIS — R1011 Right upper quadrant pain: Secondary | ICD-10-CM | POA: Diagnosis not present

## 2021-02-08 DIAGNOSIS — R112 Nausea with vomiting, unspecified: Secondary | ICD-10-CM | POA: Insufficient documentation

## 2021-02-08 DIAGNOSIS — Z7982 Long term (current) use of aspirin: Secondary | ICD-10-CM | POA: Diagnosis not present

## 2021-02-08 DIAGNOSIS — E876 Hypokalemia: Secondary | ICD-10-CM

## 2021-02-08 DIAGNOSIS — R1111 Vomiting without nausea: Secondary | ICD-10-CM | POA: Diagnosis not present

## 2021-02-08 DIAGNOSIS — I2510 Atherosclerotic heart disease of native coronary artery without angina pectoris: Secondary | ICD-10-CM | POA: Insufficient documentation

## 2021-02-08 DIAGNOSIS — J449 Chronic obstructive pulmonary disease, unspecified: Secondary | ICD-10-CM | POA: Insufficient documentation

## 2021-02-08 DIAGNOSIS — Z794 Long term (current) use of insulin: Secondary | ICD-10-CM | POA: Insufficient documentation

## 2021-02-08 DIAGNOSIS — R739 Hyperglycemia, unspecified: Secondary | ICD-10-CM | POA: Diagnosis not present

## 2021-02-08 DIAGNOSIS — Z79899 Other long term (current) drug therapy: Secondary | ICD-10-CM | POA: Insufficient documentation

## 2021-02-08 DIAGNOSIS — K219 Gastro-esophageal reflux disease without esophagitis: Secondary | ICD-10-CM | POA: Insufficient documentation

## 2021-02-08 DIAGNOSIS — R1084 Generalized abdominal pain: Secondary | ICD-10-CM | POA: Diagnosis not present

## 2021-02-08 LAB — COMPREHENSIVE METABOLIC PANEL
ALT: 15 U/L (ref 0–44)
AST: 13 U/L — ABNORMAL LOW (ref 15–41)
Albumin: 4.1 g/dL (ref 3.5–5.0)
Alkaline Phosphatase: 158 U/L — ABNORMAL HIGH (ref 38–126)
Anion gap: 7 (ref 5–15)
BUN: 10 mg/dL (ref 6–20)
CO2: 25 mmol/L (ref 22–32)
Calcium: 9.5 mg/dL (ref 8.9–10.3)
Chloride: 102 mmol/L (ref 98–111)
Creatinine, Ser: 0.54 mg/dL (ref 0.44–1.00)
GFR, Estimated: >60 mL/min (ref >60)
Glucose, Bld: 340 mg/dL — ABNORMAL HIGH (ref 70–99)
Potassium: 3.1 mmol/L — ABNORMAL LOW (ref 3.5–5.1)
Sodium: 134 mmol/L — ABNORMAL LOW (ref 135–145)
Total Bilirubin: 0.6 mg/dL (ref 0.3–1.2)
Total Protein: 8.2 g/dL — ABNORMAL HIGH (ref 6.5–8.1)

## 2021-02-08 LAB — CBC
HCT: 43.7 % (ref 36.0–46.0)
Hemoglobin: 14.8 g/dL (ref 12.0–15.0)
MCH: 31.8 pg (ref 26.0–34.0)
MCHC: 33.9 g/dL (ref 30.0–36.0)
MCV: 94 fL (ref 80.0–100.0)
Platelets: 346 10*3/uL (ref 150–400)
RBC: 4.65 MIL/uL (ref 3.87–5.11)
RDW: 13.9 % (ref 11.5–15.5)
WBC: 10.4 10*3/uL (ref 4.0–10.5)
nRBC: 0 % (ref 0.0–0.2)

## 2021-02-08 LAB — LIPASE, BLOOD: Lipase: 22 U/L (ref 11–51)

## 2021-02-08 MED ORDER — METOCLOPRAMIDE HCL 5 MG/ML IJ SOLN
10.0000 mg | Freq: Once | INTRAMUSCULAR | Status: AC
  Administered 2021-02-09: 10 mg via INTRAVENOUS
  Filled 2021-02-08: qty 2

## 2021-02-08 MED ORDER — DICYCLOMINE HCL 10 MG PO CAPS
20.0000 mg | ORAL_CAPSULE | Freq: Once | ORAL | Status: AC
  Administered 2021-02-09: 20 mg via ORAL
  Filled 2021-02-08: qty 2

## 2021-02-08 MED ORDER — SODIUM CHLORIDE 0.9 % IV BOLUS
1000.0000 mL | Freq: Once | INTRAVENOUS | Status: AC
  Administered 2021-02-09: 1000 mL via INTRAVENOUS

## 2021-02-08 MED ORDER — POTASSIUM CHLORIDE CRYS ER 20 MEQ PO TBCR
40.0000 meq | EXTENDED_RELEASE_TABLET | Freq: Once | ORAL | Status: AC
  Administered 2021-02-09: 40 meq via ORAL
  Filled 2021-02-08: qty 2

## 2021-02-08 MED ORDER — FENTANYL CITRATE (PF) 100 MCG/2ML IJ SOLN
50.0000 ug | Freq: Once | INTRAMUSCULAR | Status: AC
  Administered 2021-02-09: 50 ug via INTRAVENOUS
  Filled 2021-02-08: qty 2

## 2021-02-08 NOTE — ED Provider Notes (Signed)
[facility] Emergency Department Provider Note  ____________________________________________   I have reviewed the triage vital signs and the nursing notes.   HISTORY  Chief Complaint Diarrhea   History limited by: Not Limited   HPI Renee Mack is a 55 y.o. female who presents to the emergency department today with primary concern for diarrhea. The patient states that she is having very frequent and odorous diarrhea. It is green in color. She has had associated nausea and some upper abdominal discomfort. The patient was seen for similar symptoms two days ago in the emergency department, although the diarrhea was not as severe then. The patient states she has a history of c. Dif. The patient denies any fevers. States she has had some chills.   Records reviewed. Per medical record review patient has a history of ER visit two days ago with nausea, vomiting abdominal pain. Had CT abd/pel performed without any clear etiology of the symptoms.   Past Medical History:  Diagnosis Date   Cancer ([place])    vulvular   Cervical disc disease    a. 01/2018 MRI Cervical spine: Cervical spondylosis with multilevel disc and facet degeneration greatest at C5-6 and C6-7.  Moderate to sev R C5-6, mod L C5-6, and mod bilat C6-7 foraminal stenosis with multilevel mild foraminal stenosis.  Mild C5-6 and mod C6-7 canal stenosis. C6-7 central cord impingement w/ cord flattening.   Chronic combined systolic (congestive) and diastolic (congestive) heart failure ([place])    a. 12/2017 Echo: EF 30-35%, mid-apicalanteroseptal, ant, and apical AK. Gr1 DD. Mild conc LVH; b. 03/2018 Echo: EF 45-50%, antsept, ant HK. Mild MR. Nl LA size. Nl RV fxn.   COPD (chronic obstructive pulmonary disease) (HCC)    not on home oxygen   Coronary artery disease    a. 12/2017 ACS/PCI: LAD 100p (2.25x26 Resolute Onyx DES), 77m(2.0x12 Resolute Onyx DES), 80d (2.0x15 Resolute Onyx DES), RCA 90p (non-dominant). EF  25-35%. Post-MI course complicated by CGS; b. [id number]/subacute thrombosis-->LAD 100 (PTCA + DES x 1); c. 01/2018 NSTEMI/PCI: LM min irregs, LAD 50-60p ISR/hazy (3.5x12 Resolute DES), 653m (underexpansion of prior stents-->PTCA). EF 45-50%.   GERD (gastroesophageal reflux disease)    H/O degenerative disc disease    Hypotension    Ischemic cardiomyopathy    a. 12/2017 Echo: EF 30-35%; b. 03/2018 Echo: EF 45-50%.   [place]ocardial infarction ([place]   a. 12/2017-->DES to LAD x 3.   Pancreatitis    Shingles    Spinal stenosis    Tobacco abuse    Ulcer (traumatic) of oral mucosa    Urinary incontinence    VIN III (vulvar intraepithelial neoplasia III)     Patient Active Problem List   Diagnosis Date Noted   Malnutrition of moderate degree 11/12/2020   Spinal stenosis of cervical region 11/06/2020   Diabetes mellitus without complication ([id number] Hemoptysis 11/06/2020   Dilated bile duct 11/06/2020   Left arm weakness    Intractable vomiting with nausea 10/10/2020   Diabetic ketoacidosis (HCH[place]10/03/2020   DKA (diabetic ketoacidosis) (HCCooter10/03/2020   Vulvar cancer ([place]CP[place]02/10/2019   Palliative care patient 09/04/2018   Hyperglycemia 05/28/2018   Vulvar intraepithelial neoplasia (VIN) grade 3 05/24/2018   History of ST elevation myocardial infarction (STEMI) 02/27/2018   CAD (coronary artery disease) 02/27/2018   Hyperlipidemia associated with type 2 diabetes mellitus (HCFisher09/02/2018   Anxiety associated with depression [id number] Chronic systolic heart failure (HCC)    Chest pain  12/26/2017   Unstable angina ([place])    Ischemic cardiomyopathy    Former smoker    Protein-calorie malnutrition, severe 07/18/2017   DM (diabetes mellitus) ([place]) 12/28/2014   Centrilobular emphysema ([place]) 12/28/2014   GERD (gastroesophageal reflux disease) 12/28/2014   History of shingles 12/28/2014   Hyperlipidemia LDL goal <70 12/28/2014    Past Surgical History:  Procedure  Laterality Date   ABDOMINAL HYSTERECTOMY     partial   ANTERIOR CERVICAL DECOMP/DISCECTOMY FUSION N/A 11/06/2020   Procedure: ANTERIOR CERVICAL DECOMPRESSION/DISCECTOMY FUSION 2 LEVELS C5-C7;  Surgeon: Meade Maw, MD;  Location: [facility] ORS;  Service: Neurosurgery;  Laterality: N/A;   CORONARY BALLOON ANGIOPLASTY N/A 05/22/2018   Procedure: CORONARY BALLOON ANGIOPLASTY;  Surgeon: Wellington Hampshire, MD;  Location: [place] [facility];  Service: Cardiovascular;  Laterality: N/A;   CORONARY STENT INTERVENTION N/A 12/26/2017   Procedure: CORONARY STENT INTERVENTION;  Surgeon: Wellington Hampshire, MD;  Location: [place] [facility];  Service: Cardiovascular;  Laterality: N/A;   CORONARY STENT INTERVENTION N/A 02/08/2018   Procedure: CORONARY STENT INTERVENTION;  Surgeon: Nelva Bush, MD;  Location: [place] [facility];  Service: Cardiovascular;  Laterality: N/A;   CORONARY/GRAFT ACUTE MI REVASCULARIZATION N/A 12/19/2017   Procedure: Coronary/Graft Acute MI Revascularization;  Surgeon: Wellington Hampshire, MD;  Location: [place] [facility];  Service: Cardiovascular;  Laterality: N/A;   ECTOPIC PREGNANCY SURGERY Left    INTRAVASCULAR ULTRASOUND/IVUS N/A 02/08/2018   Procedure: Intravascular Ultrasound/IVUS;  Surgeon: Nelva Bush, MD;  Location: [place] [facility];  Service: Cardiovascular;  Laterality: N/A;   LEFT HEART CATH AND CORONARY ANGIOGRAPHY N/A 12/19/2017   Procedure: LEFT HEART CATH AND CORONARY ANGIOGRAPHY;  Surgeon: Wellington Hampshire, MD;  Location: [place] [facility];  Service: Cardiovascular;  Laterality: N/A;   LEFT HEART CATH AND CORONARY ANGIOGRAPHY N/A 12/26/2017   Procedure: LEFT HEART CATH AND CORONARY ANGIOGRAPHY;  Surgeon: Wellington Hampshire, MD;  Location: [place] [facility];  Service: Cardiovascular;  Laterality: N/A;   LEFT HEART CATH AND CORONARY ANGIOGRAPHY N/A 02/08/2018   Procedure: LEFT HEART CATH AND CORONARY ANGIOGRAPHY;  Surgeon: Nelva Bush, MD;  Location:  [place] [facility];  Service: Cardiovascular;  Laterality: N/A;   LEFT HEART CATH AND CORONARY ANGIOGRAPHY N/A 05/22/2018   Procedure: LEFT HEART CATH AND CORONARY ANGIOGRAPHY;  Surgeon: Wellington Hampshire, MD;  Location: [place] [facility];  Service: Cardiovascular;  Laterality: N/A;   LEFT HEART CATH AND CORONARY ANGIOGRAPHY N/A 06/15/2018   Procedure: LEFT HEART CATH AND CORONARY ANGIOGRAPHY;  Surgeon: Wellington Hampshire, MD;  Location: [place] [facility];  Service: Cardiovascular;  Laterality: N/A;   LYMPHADENECTOMY     SPLENECTOMY, PARTIAL     vulvulasectomy      Prior to Admission medications   Medication Sig Start Date End Date Taking? Authorizing Provider  albuterol (PROAIR HFA) 108 (90 Base) MCG/ACT inhaler Inhale 1 puff into the lungs every 6 (six) hours as needed for wheezing or shortness of breath.     [provider]  amitriptyline (ELAVIL) 25 MG tablet Take 1 tablet (25 mg total) by mouth at bedtime. Patient taking differently: Take 75 mg by mouth at bedtime. 04/05/20 05/05/20  ShahmehdiValeria Batman, MD  amitriptyline (ELAVIL) 75 MG tablet Take 75 mg by mouth at bedtime. 11/28/20   [provider]  amLODipine (NORVASC) 5 MG tablet Take 1 tablet (5 mg total) by mouth daily. 11/14/20   Bonnielee Haff, MD  aspirin 81 MG tablet  Take 81 mg by mouth daily.    [provider]  atorvastatin (LIPITOR) 80 MG tablet Take 1 tablet (80 mg total) by mouth daily at 6 PM. 03/05/19 03/30/20  Theora Gianotti, NP  Blood Glucose Monitoring Suppl (GLUCOCOM BLOOD GLUCOSE MONITOR) DEVI 1 m. 11/15/17   Karamalegos, Devonne Doughty, DO  clonazePAM (KLONOPIN) 1 MG tablet Take 1 mg by mouth 3 (three) times daily as needed. Patient not taking: Reported on 01/19/2021 03/19/20   [provider]  diazepam (VALIUM) 5 MG tablet Take 2.5-5 mg by mouth every 8 (eight) hours as needed. 12/31/20   [provider]  doxycycline (VIBRAMYCIN) 100 MG capsule Take 1 capsule (100  mg total) by mouth 2 (two) times daily for 7 days. 02/06/21 02/13/21  Lucrezia Starch, MD  esomeprazole (NEXIUM) 40 MG capsule Take 1 capsule (40 mg total) by mouth daily before breakfast. 12/26/20   Parks Ranger, Devonne Doughty, DO  famotidine (PEPCID) 20 MG tablet Take 1 tablet (20 mg total) by mouth 2 (two) times daily. 12/26/20   Karamalegos, Devonne Doughty, DO  insulin isophane & regular human (NOVOLIN 70/30 FLEXPEN) (70-30) 100 UNIT/ML KwikPen Inject 18 Units into the skin 2 (two) times daily. 04/05/20   Deatra James, MD  Lancet Devices (SIMPLE DIAGNOSTICS LANCING DEV) MISC AS DIRECTED 11/15/17   Parks Ranger Devonne Doughty, DO  Lidocaine HCl 4 % GEL Apply topically. 02/05/20   [provider]  methocarbamol (ROBAXIN) 500 MG tablet Take 1 tablet by mouth 4 (four) times daily as needed. 08/01/20   [provider]  mirtazapine (REMERON) 15 MG tablet Take 15 mg by mouth at bedtime. 07/07/18   [provider]  nitroGLYCERIN (NITROSTAT) 0.4 MG SL tablet Place 1 tablet (0.4 mg total) under the tongue every 5 (five) minutes as needed for chest pain. 06/16/18   Nicholes Mango, MD  OLANZapine (ZYPREXA) 5 MG tablet Take by mouth. 01/08/21 02/07/21  [provider]  ondansetron (ZOFRAN ODT) 4 MG disintegrating tablet Take 1 tablet (4 mg total) by mouth every 8 (eight) hours as needed for nausea or vomiting. 12/24/20   Lucrezia Starch, MD  oxyCODONE (OXYCONTIN) 80 mg 12 hr tablet Take 1 tablet by mouth every 8 (eight) hours. Patient not taking: Reported on 12/09/2020    [provider]  oxycodone (ROXICODONE) 30 MG immediate release tablet Take 30 mg by mouth every 3 (three) hours as needed. Patient not taking: Reported on 01/19/2021 04/14/20   [provider]  oxyCODONE ER (XTAMPZA ER) 36 MG C12A Take 2 capsules by mouth every 8 (eight) hours.    [provider]  polyethylene glycol (MIRALAX / GLYCOLAX) 17 g packet Take 17 g by mouth daily. 11/14/20   Bonnielee Haff, MD  ranolazine (RANEXA) 500 MG 12 hr tablet Take 1 tablet (500 mg total) by mouth 2 (two) times daily. 04/05/20 05/05/20  Deatra James, MD  SSD 1 % cream SMARTSIG:1 Topical 4-5 Times Daily 01/25/20   [provider]  sucralfate (CARAFATE) 1 g tablet Take 1 g by mouth 4 (four) times daily. 03/01/20   [provider]  ticagrelor (BRILINTA) 90 MG TABS tablet Take 1 tablet (90 mg total) by mouth 2 (two) times daily. *NEEDS OFFICE VISIT FOR FURTHER REFILLS-PLEASE CALL [phone] TO SCHEDULE.* Patient not taking: Reported on 01/19/2021 05/14/19   Theora Gianotti, NP    Allergies Bee venom, Metformin and related, Darvon [propoxyphene], Gabapentin, Nsaids, Tramadol, and Contrast media [iodinated diagnostic agents]  Family History  Problem Relation Age of Onset   Non-Hodgkin's lymphoma Mother    Osteoporosis Mother    Lupus Sister    Heart disease Brother    Heart attack Brother    Heart attack Father     Social History Social History   Tobacco Use   Smoking status: Former    Packs/day: 0.50    Years: 30.00    Pack years: 15.00    Types: Cigarettes    Quit date: 12/19/2017    Years since quitting: 3.1   Smokeless tobacco: Former   Tobacco comments:    quit 12/19/2017.  Vaping Use   Vaping Use: Never used  Substance Use Topics   Alcohol use: No   Drug use: Yes    Types: Oxycodone    Review of Systems Constitutional: Positive for chills.  Eyes: No visual changes. ENT: No sore throat. Cardiovascular: Denies chest pain. Respiratory: Denies shortness of breath. Gastrointestinal: Positive for abdominal pain, n/v/d.  Genitourinary: Negative for dysuria. Musculoskeletal: Negative for back pain. Skin: Negative for rash. Neurological: Negative for headaches, focal weakness or numbness.  ____________________________________________   PHYSICAL EXAM:  VITAL SIGNS: ED Triage Vitals  Enc Vitals Group     BP 02/08/21 1840 (!) 142/84     Pulse  Rate 02/08/21 1840 77     Resp 02/08/21 1840 20     Temp 02/08/21 1840 98.2 F (36.8 C)     Temp Source 02/08/21 1840 Oral     SpO2 02/08/21 1840 99 %     Weight 02/08/21 1841 125 lb (56.7 kg)     Height 02/08/21 1841 '5\' 1"'$  (1.549 m)     Head Circumference --      Peak Flow --      Pain Score 02/08/21 1841 10   Constitutional: Alert and oriented.  Eyes: Conjunctivae are normal.  ENT      Head: Normocephalic and atraumatic.      Nose: No congestion/rhinnorhea.      Mouth/Throat: Mucous membranes are moist.      Neck: No stridor. Hematological/Lymphatic/Immunilogical: No cervical lymphadenopathy. Cardiovascular: Normal rate, regular rhythm.  No murmurs, rubs, or gallops.  Respiratory: Normal respiratory effort without tachypnea nor retractions. Breath sounds are clear and equal bilaterally. No wheezes/rales/rhonchi. Gastrointestinal: Soft and diffusely tender to palpation. Genitourinary: Deferred Musculoskeletal: Normal range of motion in all extremities. No lower extremity edema. Neurologic:  Normal speech and language. No gross focal neurologic deficits are appreciated.  Skin:  Skin is warm, dry and intact. No rash noted. Psychiatric: Mood and affect are normal. Speech and behavior are normal. Patient exhibits appropriate insight and judgment.  ____________________________________________    LABS (pertinent positives/negatives)  Lipase 22 CBC wbc 10.4, hgb 14.8, plt 346 CMP na 134, k 3.1, glu 340, cr 0.54 C dif negative GI panel negative ____________________________________________   EKG  I, Nance Pear, attending physician, personally viewed and interpreted this EKG  EKG Time: 1844 Rate: 77 Rhythm: normal sinus rhythm Axis: normal Intervals: qtc 420 QRS: narrow, q waves v1, v2 ST changes: no st elevation Impression: abnormal ekg ____________________________________________     RADIOLOGY  None  ____________________________________________   PROCEDURES  Procedures  ____________________________________________   INITIAL IMPRESSION / ASSESSMENT AND PLAN / ED COURSE  Pertinent labs & imaging results that were available during my care of the patient were reviewed by me and considered in my medical decision making (see chart for details).   Patient presented to the emergency department today because of  concerns for diarrhea.  Blood work does show hypokalemia which would be consistent with GI loss.  While here in the emergency department she did have a few episodes of diarrhea.  Stool sample was sent and was negative for C. difficile and GI panel was negative.  Patient did feel some improvement with medication.  At this point somewhat unclear etiology of the patient's diarrhea.  She does states she has been under a lot of stress recently.  Will plan on discharge and follow-up with primary care and GI.  ____________________________________________   FINAL CLINICAL IMPRESSION(S) / ED DIAGNOSES  Final diagnoses:  Diarrhea, unspecified type  Hypokalemia     Note: This dictation was prepared with Dragon dictation. Any transcriptional errors that result from this process are unintentional     Nance Pear, MD 02/09/21 [phone]

## 2021-02-08 NOTE — ED Triage Notes (Signed)
Pt via EMS from home. Pt c/o NVD for a couple days. Pt was seen Friday and dx with pneumonia, d/c with antibiotics. Pt states she is unable to keep anything down. Pt also c/o upper abd pain, pt also states that she has burning when she pees. Pt also has a hx of valvular cancer and is a palliative care pt. Pt is A&OX4 and NAD

## 2021-02-08 NOTE — ED Triage Notes (Signed)
Arrived by EMS from home. N/V/D X2 days. Burning during urination with hx UTIs and pancreatitis. Blood sugar 374. EMS vitals 157/72, 100%, 79pulse. A&O x4

## 2021-02-09 DIAGNOSIS — E876 Hypokalemia: Secondary | ICD-10-CM | POA: Diagnosis not present

## 2021-02-09 LAB — GASTROINTESTINAL PANEL BY PCR, STOOL (REPLACES STOOL CULTURE)

## 2021-02-09 LAB — C DIFFICILE QUICK SCREEN W PCR REFLEX
C Diff antigen: NEGATIVE
C Diff interpretation: NOT DETECTED
C Diff toxin: NEGATIVE

## 2021-02-09 MED ORDER — FENTANYL CITRATE (PF) 100 MCG/2ML IJ SOLN
50.0000 ug | Freq: Once | INTRAMUSCULAR | Status: AC
Start: 2021-02-09 — End: 2021-02-09
  Administered 2021-02-09: 50 ug via INTRAVENOUS
  Filled 2021-02-09: qty 2

## 2021-02-09 MED ORDER — DICYCLOMINE HCL 10 MG PO CAPS: 10.0000 mg | ORAL_CAPSULE | Freq: Three times a day (TID) | ORAL | 0 refills | Status: AC | PRN

## 2021-02-09 MED ORDER — PROMETHAZINE HCL 25 MG PO TABS: 25.0000 mg | ORAL_TABLET | Freq: Four times a day (QID) | ORAL | 0 refills | Status: AC | PRN

## 2021-02-09 MED ORDER — LOPERAMIDE HCL 2 MG PO CAPS
4.0000 mg | ORAL_CAPSULE | Freq: Once | ORAL | Status: AC
  Administered 2021-02-09: 4 mg via ORAL
  Filled 2021-02-09: qty 2

## 2021-02-09 NOTE — ED Notes (Signed)
Stool incontinence, pericare provided, stool sent to lab

## 2021-02-09 NOTE — ED Notes (Signed)
Pt assisted to bathroom, missed urine collection

## 2021-02-09 NOTE — Discharge Instructions (Addendum)
Please seek medical attention for any high fevers, chest pain, shortness of breath, change in behavior, persistent vomiting, bloody stool or any other new or concerning symptoms.  

## 2021-02-19 NOTE — Telephone Encounter (Signed)
Schedule

## 2021-02-24 ENCOUNTER — Telehealth: Payer: Self-pay

## 2021-03-09 ENCOUNTER — Ambulatory Visit: Payer: Medicare Other | Admitting: Nurse Practitioner

## 2021-03-21 NOTE — Telephone Encounter (Signed)
Will complete death certificate once available on [place], [employer] , 4:55 PM

## 2021-03-21 NOTE — Telephone Encounter (Signed)
Renee Mack from [employer] police Department called to notify that pt is deceased. Transferred to [facility]. Spoke with Mickel Baas and informed her about this pt.

## 2021-03-21 NOTE — Telephone Encounter (Signed)
Officer Alroy Dust  with the [employer]  called  [phone]    Wanted to  know if you would sign pt death certificate...Marland KitchenMarland Kitchen Per  officer Alroy Dust  EMS time of   death was 9:13  02-26-21   Per the officer pt daughter spoke with the pt on Friday afternoon and said that she was a little off, talking out of her head. Officer  Alroy Dust said that you can call him if you have any questions

## 2021-03-21 DEATH — deceased

## 2021-12-01 ENCOUNTER — Ambulatory Visit: Payer: Medicaid Other

## 2021-12-15 ENCOUNTER — Ambulatory Visit: Payer: Medicaid Other

## 2022-09-24 IMAGING — CR DG CHEST 2V
2 series · 2 of 2 positions shown · non-contrast
Comparison: 03/30/2020

CLINICAL DATA: Chest pain

EXAM:
CHEST - 2 VIEW

[chest pa]
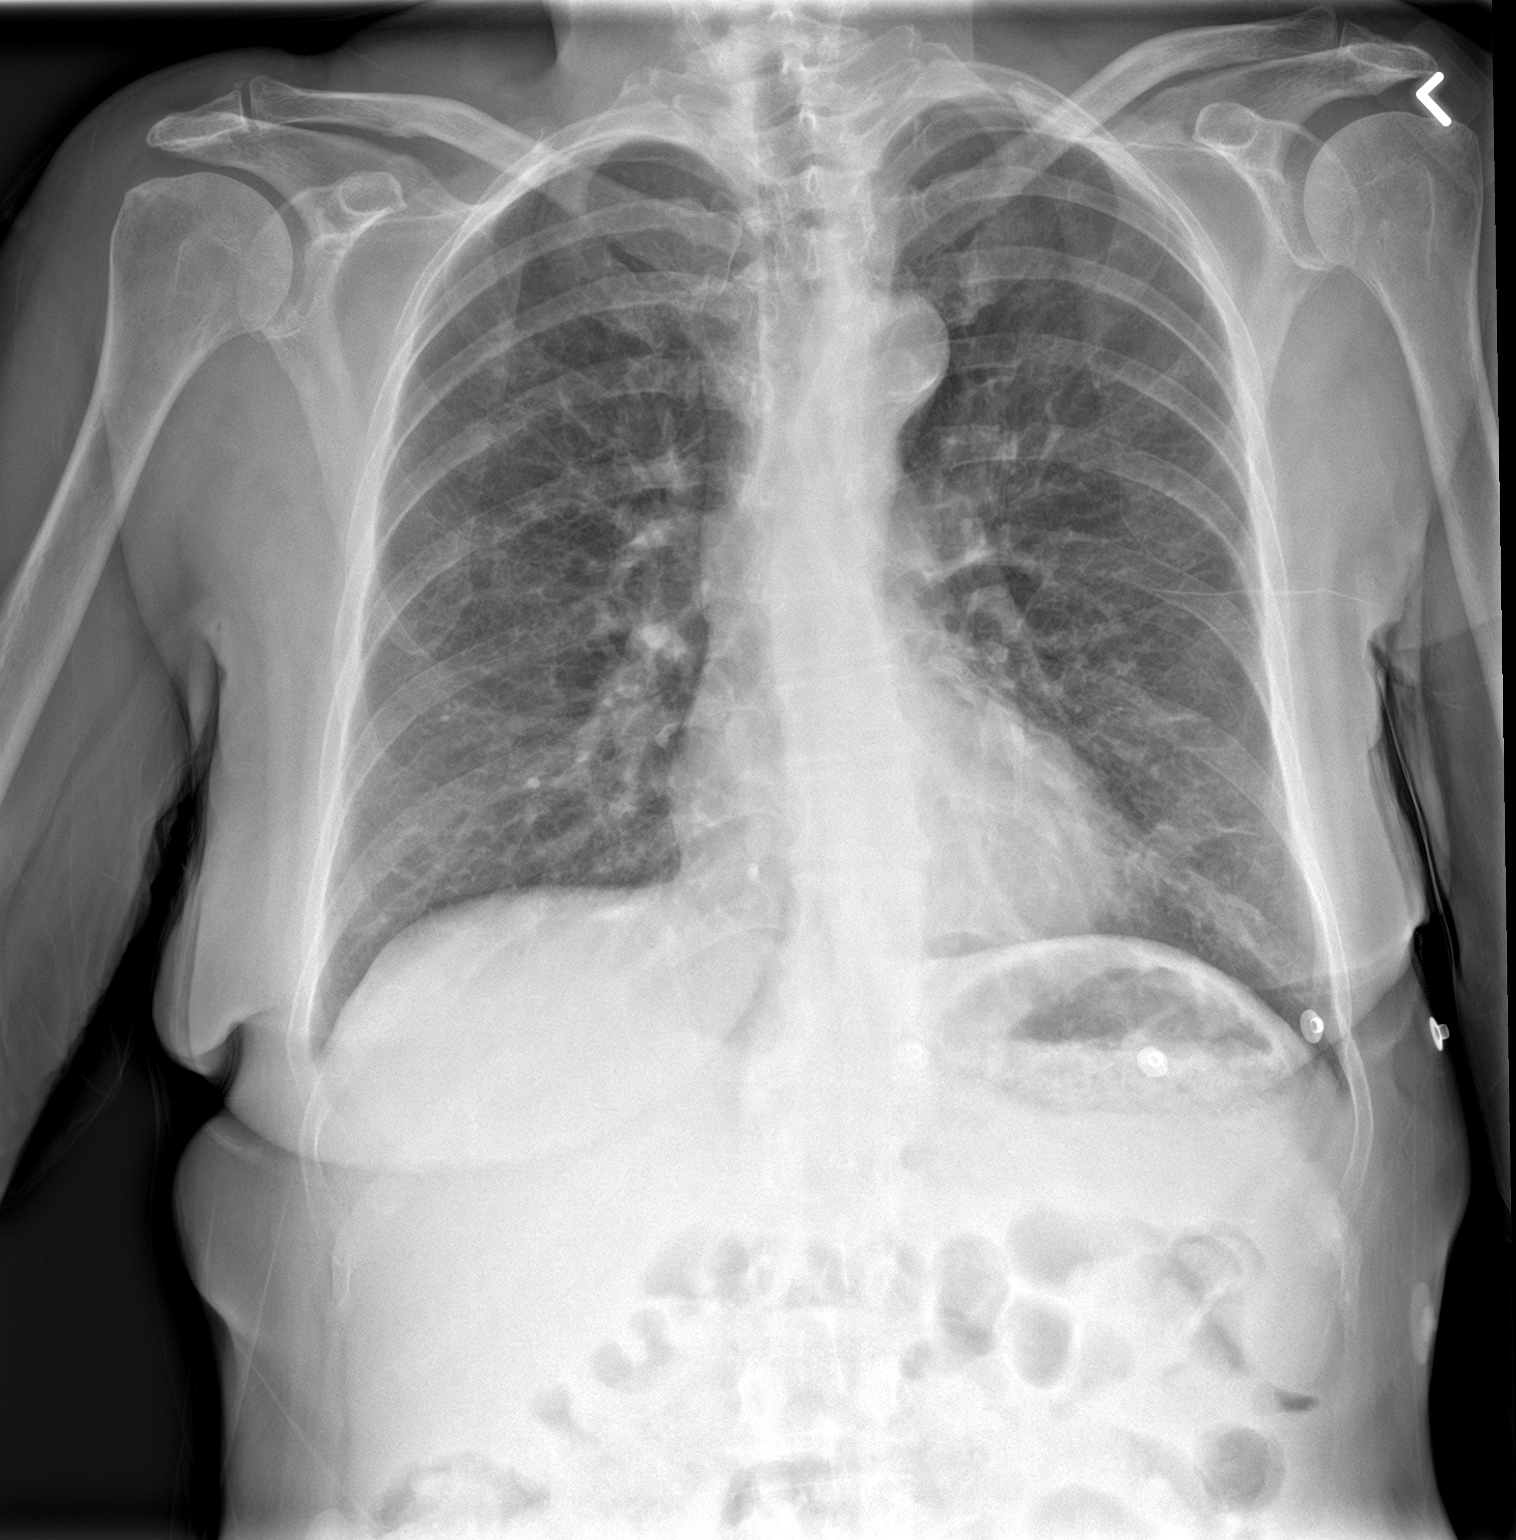

[chest lat]
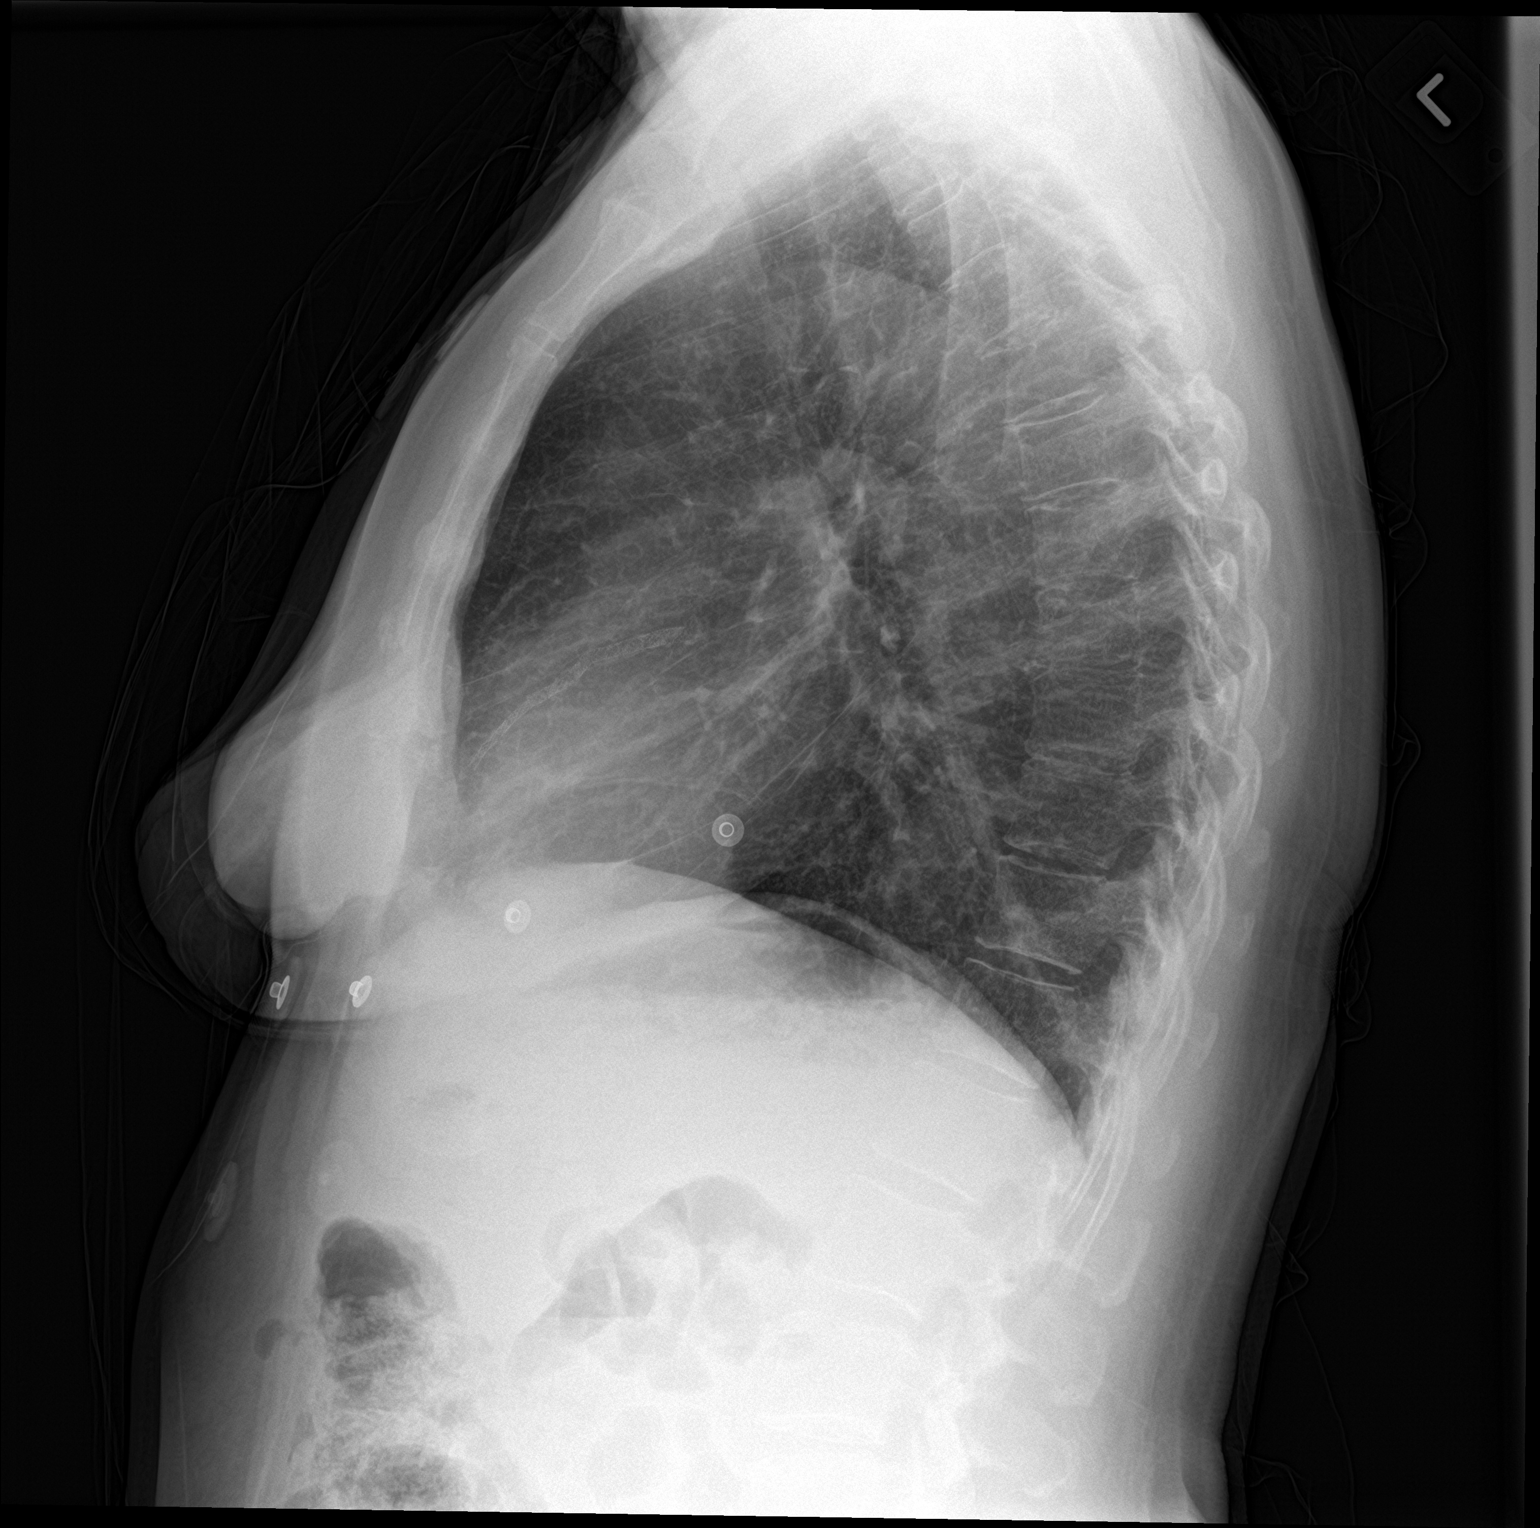

[2 of 2 positions shown; findings below may reference images not displayed]

FINDINGS: Minimal left basilar scarring is noted. Tiny granuloma within the
right upper lobe. The lungs are otherwise clear. No pneumothorax or
pleural effusion. Cardiac size within normal limits. Pulmonary
vascularity is normal. No acute bone abnormality.
IMPRESSION: No active cardiopulmonary disease.
